# Patient Record
Sex: Female | Born: 1935 | Race: Black or African American | Hispanic: No | Smoking: Never smoker
Health system: Southern US, Community
[De-identification: ages and names within clinical notes are randomized; demographics above are authoritative.]

## PROBLEM LIST (undated history)

## (undated) DIAGNOSIS — I739 Peripheral vascular disease, unspecified: Secondary | ICD-10-CM

## (undated) DIAGNOSIS — E785 Hyperlipidemia, unspecified: Secondary | ICD-10-CM

## (undated) DIAGNOSIS — R0600 Dyspnea, unspecified: Secondary | ICD-10-CM

## (undated) DIAGNOSIS — I10 Essential (primary) hypertension: Secondary | ICD-10-CM

## (undated) DIAGNOSIS — L97209 Non-pressure chronic ulcer of unspecified calf with unspecified severity: Secondary | ICD-10-CM

## (undated) DIAGNOSIS — I249 Acute ischemic heart disease, unspecified: Secondary | ICD-10-CM

## (undated) DIAGNOSIS — E119 Type 2 diabetes mellitus without complications: Secondary | ICD-10-CM

## (undated) DIAGNOSIS — K5900 Constipation, unspecified: Secondary | ICD-10-CM

## (undated) DIAGNOSIS — K219 Gastro-esophageal reflux disease without esophagitis: Secondary | ICD-10-CM

## (undated) DIAGNOSIS — I509 Heart failure, unspecified: Secondary | ICD-10-CM

## (undated) DIAGNOSIS — M1990 Unspecified osteoarthritis, unspecified site: Secondary | ICD-10-CM

## (undated) DIAGNOSIS — D649 Anemia, unspecified: Secondary | ICD-10-CM

## (undated) HISTORY — PX: MULTIPLE TOOTH EXTRACTIONS: SHX2053

## (undated) HISTORY — PX: EYE SURGERY: SHX253

## (undated) HISTORY — PX: ABDOMINAL HYSTERECTOMY: SHX81

## (undated) HISTORY — DX: Hyperlipidemia, unspecified: E78.5

---

## 1998-05-11 ENCOUNTER — Emergency Department (HOSPITAL_COMMUNITY): Admission: EM | Admit: 1998-05-11 | Discharge: 1998-05-11 | Payer: Self-pay | Admitting: Emergency Medicine

## 1998-05-12 ENCOUNTER — Inpatient Hospital Stay: Admission: EM | Admit: 1998-05-12 | Discharge: 1998-05-14 | Payer: Self-pay | Admitting: Emergency Medicine

## 1998-05-21 ENCOUNTER — Emergency Department (HOSPITAL_COMMUNITY): Admission: EM | Admit: 1998-05-21 | Discharge: 1998-05-21 | Payer: Self-pay | Admitting: Cardiology

## 1998-05-27 ENCOUNTER — Encounter: Admission: RE | Admit: 1998-05-27 | Discharge: 1998-05-27 | Payer: Self-pay | Admitting: Internal Medicine

## 1998-06-27 ENCOUNTER — Encounter: Admission: RE | Admit: 1998-06-27 | Discharge: 1998-06-27 | Payer: Self-pay | Admitting: Internal Medicine

## 1999-04-30 ENCOUNTER — Encounter: Admission: RE | Admit: 1999-04-30 | Discharge: 1999-04-30 | Payer: Self-pay | Admitting: Hematology and Oncology

## 2000-05-20 ENCOUNTER — Encounter: Admission: RE | Admit: 2000-05-20 | Discharge: 2000-05-20 | Payer: Self-pay | Admitting: Internal Medicine

## 2000-06-17 ENCOUNTER — Encounter: Admission: RE | Admit: 2000-06-17 | Discharge: 2000-06-17 | Payer: Self-pay | Admitting: Hematology and Oncology

## 2000-07-22 ENCOUNTER — Encounter: Admission: RE | Admit: 2000-07-22 | Discharge: 2000-07-22 | Payer: Self-pay | Admitting: Hematology and Oncology

## 2000-07-29 ENCOUNTER — Encounter: Admission: RE | Admit: 2000-07-29 | Discharge: 2000-07-29 | Payer: Self-pay | Admitting: Hematology and Oncology

## 2001-06-21 ENCOUNTER — Encounter: Admission: RE | Admit: 2001-06-21 | Discharge: 2001-06-21 | Payer: Self-pay | Admitting: Internal Medicine

## 2002-04-20 ENCOUNTER — Encounter: Admission: RE | Admit: 2002-04-20 | Discharge: 2002-04-20 | Payer: Self-pay | Admitting: Internal Medicine

## 2002-06-13 ENCOUNTER — Encounter: Admission: RE | Admit: 2002-06-13 | Discharge: 2002-06-13 | Payer: Self-pay | Admitting: Internal Medicine

## 2002-06-22 ENCOUNTER — Ambulatory Visit (HOSPITAL_COMMUNITY): Admission: RE | Admit: 2002-06-22 | Discharge: 2002-06-22 | Payer: Self-pay | Admitting: Obstetrics and Gynecology

## 2003-05-07 ENCOUNTER — Encounter: Payer: Self-pay | Admitting: Cardiology

## 2003-05-07 ENCOUNTER — Encounter: Admission: RE | Admit: 2003-05-07 | Discharge: 2003-05-07 | Payer: Self-pay | Admitting: Cardiology

## 2004-07-26 ENCOUNTER — Emergency Department (HOSPITAL_COMMUNITY): Admission: EM | Admit: 2004-07-26 | Discharge: 2004-07-27 | Payer: Self-pay | Admitting: Emergency Medicine

## 2004-07-28 ENCOUNTER — Ambulatory Visit (HOSPITAL_COMMUNITY): Admission: RE | Admit: 2004-07-28 | Discharge: 2004-07-28 | Payer: Self-pay | Admitting: Emergency Medicine

## 2004-08-13 ENCOUNTER — Ambulatory Visit (HOSPITAL_COMMUNITY): Admission: RE | Admit: 2004-08-13 | Discharge: 2004-08-13 | Payer: Self-pay | Admitting: Internal Medicine

## 2012-09-12 ENCOUNTER — Emergency Department (HOSPITAL_COMMUNITY)
Admission: EM | Admit: 2012-09-12 | Discharge: 2012-09-12 | Disposition: A | Discharge status: Home / Self Care | Payer: Medicaid Other | Source: Home / Self Care | Attending: Family Medicine | Admitting: Family Medicine

## 2012-09-12 ENCOUNTER — Encounter (HOSPITAL_COMMUNITY): Payer: Self-pay

## 2012-09-12 DIAGNOSIS — K5900 Constipation, unspecified: Secondary | ICD-10-CM

## 2012-09-12 HISTORY — DX: Type 2 diabetes mellitus without complications: E11.9

## 2012-09-12 HISTORY — DX: Essential (primary) hypertension: I10

## 2012-09-12 NOTE — ED Notes (Signed)
C/o constipation x 2 days , tried magnesium citrate

## 2012-09-12 NOTE — ED Provider Notes (Signed)
History     CSN: [id number]  Arrival date & time 09/12/12  1752   First MD Initiated Contact with Patient 09/12/12 1759      Chief Complaint  Patient presents with  . Constipation    (Consider location/radiation/quality/duration/timing/severity/associated sxs/prior treatment) Patient is a 78 y.o. female presenting with constipation. The history is provided by the patient.  Constipation  The current episode started 2 days ago. The onset was gradual. The problem has been unchanged. The patient is experiencing no pain. The stool is described as soft. Prior successful therapies include laxatives. Pertinent negatives include no fever, no abdominal pain, no diarrhea, no hemorrhoids, no nausea and no vomiting.    Past Medical History  Diagnosis Date  . Hypertension   . Diabetes mellitus without complication     History reviewed. No pertinent past surgical history.  No family history on file.  History  Substance Use Topics  . Smoking status: Never Smoker   . Smokeless tobacco: Not on file  . Alcohol Use: No    OB History    Grav Para Term Preterm Abortions TAB SAB Ect Mult Living                  Review of Systems  Constitutional: Negative.  Negative for fever.  Gastrointestinal: Positive for constipation. Negative for nausea, vomiting, abdominal pain, diarrhea, blood in stool, abdominal distention and hemorrhoids.  Genitourinary: Negative for dysuria and frequency.    Allergies  Review of patient's allergies indicates no known allergies.  Home Medications   Current Outpatient Rx  Name Route Sig Dispense Refill  . AMLODIPINE BESYLATE PO Oral Take by mouth daily.    . AMOXICILLIN ER PO Oral Take by mouth.    . ASPIRIN 81 MG PO TABS Oral Take 81 mg by mouth daily.    Marland Kitchen LIPITOR PO Oral Take by mouth daily.    Marland Kitchen CALCIUM 500 PO Oral Take by mouth.    Marland Kitchen VITAMIN D-3 PO Oral Take by mouth.    Marland Kitchen GLIMEPIRIDE PO Oral Take by mouth daily.    Marland Kitchen HYDROCODONE-ACETAMINOPHEN PO  Oral Take by mouth.    . METFORMIN HCL PO Oral Take by mouth.    . VALSARTAN PO Oral Take by mouth.      BP 152/76  Pulse 114  Temp 98.4 F (36.9 C) (Oral)  Resp 18  SpO2 99%  Physical Exam  Nursing note and vitals reviewed. Constitutional: She is oriented to person, place, and time. She appears well-developed and well-nourished. No distress.  Abdominal: Soft. Bowel sounds are normal. She exhibits no distension and no mass. There is no tenderness. There is no rebound and no guarding.  Genitourinary: Rectal exam shows no external hemorrhoid and no fissure.       Nl soft brown stool in rectum, no bleeding or hemorrhoids.  Neurological: She is alert and oriented to person, place, and time.  Skin: Skin is warm and dry.    ED Course  Procedures (including critical care time)  Labs Reviewed - No data to display No results found.   1. Simple constipation       MDM          Billy Fischer, MD 09/12/12 1949

## 2013-01-08 ENCOUNTER — Encounter (HOSPITAL_COMMUNITY): Payer: Self-pay | Admitting: Emergency Medicine

## 2013-01-08 ENCOUNTER — Emergency Department (HOSPITAL_COMMUNITY)
Admission: EM | Admit: 2013-01-08 | Discharge: 2013-01-08 | Disposition: A | Discharge status: Home / Self Care | Payer: Medicare Other | Source: Home / Self Care

## 2013-01-08 DIAGNOSIS — I10 Essential (primary) hypertension: Secondary | ICD-10-CM

## 2013-01-08 MED ORDER — CLONIDINE HCL 0.1 MG PO TABS
0.1000 mg | ORAL_TABLET | Freq: Once | ORAL | Status: AC
  Administered 2013-01-08: 0.1 mg via ORAL

## 2013-01-08 MED ORDER — CLONIDINE HCL 0.1 MG PO TABS
ORAL_TABLET | ORAL | Status: AC
  Filled 2013-01-08: qty 1

## 2013-01-08 NOTE — ED Notes (Signed)
Blood pressure high.  Reports onset as yesterday.

## 2013-01-08 NOTE — ED Provider Notes (Signed)
History     CSN: [id number]  Arrival date & time 01/08/13  1515   First MD Initiated Contact with Patient 01/08/13 1525      No chief complaint on file.   (Consider location/radiation/quality/duration/timing/severity/associated sxs/prior treatment) The history is provided by the patient.   This is an 79 year old female who went to pick up some medication at the pharmacy. While there she had her blood pressure checked and was told it was high she is not aware of what the actual numbers were. She decided to come down here to urgent care because of his high blood pressure reading. She has not had any headache numbness tingling chest pain dizziness or shortness of breath. She has some pain in the left side of her neck which is related to certain movements. She is prescribed the following blood pressure medications: Amlodipine 10 Valsartan/hctz360/25  She notes she took the amlodipine this morning but cannot recall if she took the valsartan/HCTZ. At this point she is hesitant to take one in case she already took it this morning. She is agreeable to take the clonidine. In regards to diet she states that the food she eats already has salt in it for example chicken nuggets and bread. She rarely cooks and is unable to control the amount of salt she takes. Past Medical History  Diagnosis Date  . Hypertension   . Diabetes mellitus without complication     No past surgical history on file.  No family history on file.  History  Substance Use Topics  . Smoking status: Never Smoker   . Smokeless tobacco: Not on file  . Alcohol Use: No    OB History   Grav Para Term Preterm Abortions TAB SAB Ect Mult Living                  Review of Systems  Constitutional: Negative.   HENT: Negative.   Eyes: Negative.   Respiratory: Negative for cough, chest tightness and shortness of breath.   Cardiovascular: Negative for chest pain, palpitations and leg swelling.  Gastrointestinal: Negative.    Neurological: Positive for dizziness. Negative for seizures, syncope, speech difficulty, numbness and headaches.  Psychiatric/Behavioral: Negative.     Allergies  Review of patient's allergies indicates no known allergies.  Home Medications   Current Outpatient Rx  Name  Route  Sig  Dispense  Refill  . AMLODIPINE BESYLATE PO   Oral   Take by mouth daily.         . AMOXICILLIN ER PO   Oral   Take by mouth.         Marland Kitchen aspirin 81 MG tablet   Oral   Take 81 mg by mouth daily.         . Atorvastatin Calcium (LIPITOR PO)   Oral   Take by mouth daily.         . Calcium Carbonate (CALCIUM 500 PO)   Oral   Take by mouth.         . Cholecalciferol (VITAMIN D-3 PO)   Oral   Take by mouth.         Marland Kitchen GLIMEPIRIDE PO   Oral   Take by mouth daily.         Marland Kitchen HYDROCODONE-ACETAMINOPHEN PO   Oral   Take by mouth.         . METFORMIN HCL PO   Oral   Take by mouth.         . VALSARTAN PO  Oral   Take by mouth.           BP 174/85  Pulse 101  Temp(Src) 98 F (36.7 C) (Oral)  Resp 18  SpO2 100%  Physical Exam  ED Course  Procedures (including critical care time)  Labs Reviewed - No data to display No results found.   Hypertension    MDM  I'm giving her a dose of clonidine right now. I will not give her a prescription as she is to see her doctor in 2 days. I would not want to add a medication based on 1 high blood pressure reading especially, since the patient is not sure if she took her valsartan/HCTZ this morning.  She is strongly advised to take a low-sodium diet       Debbe Odea, MD 01/08/13 [phone]

## 2013-01-08 NOTE — ED Notes (Signed)
Physician completed assessment

## 2013-06-13 ENCOUNTER — Other Ambulatory Visit: Payer: Self-pay | Admitting: Family Medicine

## 2013-06-13 ENCOUNTER — Ambulatory Visit
Admission: RE | Admit: 2013-06-13 | Discharge: 2013-06-13 | Disposition: A | Discharge status: Home / Self Care | Payer: Medicare Other | Source: Ambulatory Visit | Attending: Family Medicine | Admitting: Family Medicine

## 2013-06-13 DIAGNOSIS — M25552 Pain in left hip: Secondary | ICD-10-CM

## 2013-11-06 ENCOUNTER — Encounter: Payer: Self-pay | Admitting: Podiatry

## 2013-11-06 ENCOUNTER — Ambulatory Visit: Payer: Medicare Other | Admitting: Podiatry

## 2013-11-06 VITALS — BP 148/72 | HR 77 | Resp 16 | Ht 65.5 in | Wt 150.0 lb

## 2013-11-06 DIAGNOSIS — E1159 Type 2 diabetes mellitus with other circulatory complications: Secondary | ICD-10-CM

## 2013-11-06 DIAGNOSIS — L84 Corns and callosities: Secondary | ICD-10-CM

## 2013-11-06 DIAGNOSIS — M79609 Pain in unspecified limb: Secondary | ICD-10-CM

## 2013-11-06 DIAGNOSIS — B351 Tinea unguium: Secondary | ICD-10-CM

## 2013-11-06 NOTE — Progress Notes (Signed)
   Subjective:    Patient ID: [name], female    DOB: 1926/07/05, 79 y.o.   MRN: [id number] "That place in between my toes is sore."  HPI Comments: N  Sore  L  Ulcer 2nd Left interdigital D  Almost a month O  Suddenly  C  About the same A  Shoes, especially if they're tight  T  Peroxide, use something to pad it   Patient presents for ongoing skin and nail debridement and has been a patient in this practice since 2009.   Review of Systems     Objective:   Physical Exam  Orientated x23 79 year old white female  Hyperkeratotic tissue along the lateral border of the second left toe and the medial border of the left hallux. There is no breakdown tissue in either of these areas. All nails are incurvated, hypertrophic, discolored and tender to pressure       Assessment & Plan:   Assessment: Hyperkeratotic lesions x2 associated with friction rub of the left hallux against the second left toe. Symptomatic onychomycoses x10  Plan: The hyperkeratotic lesions x2 were debrided back and a gel toe separator was dispensed to insert between the left hallux and second left toe. All 10 toenails are debrided back without any bleeding. Reappoint at three-month intervals

## 2013-12-31 ENCOUNTER — Inpatient Hospital Stay (HOSPITAL_COMMUNITY)
Admission: EM | Admit: 2013-12-31 | Discharge: 2014-01-01 | DRG: 069 | Disposition: A | Discharge status: Home / Self Care | Payer: Medicare Other | Attending: Family Medicine | Admitting: Family Medicine

## 2013-12-31 ENCOUNTER — Emergency Department (HOSPITAL_COMMUNITY): Payer: Medicare Other

## 2013-12-31 ENCOUNTER — Inpatient Hospital Stay (HOSPITAL_COMMUNITY): Payer: Medicare Other

## 2013-12-31 ENCOUNTER — Encounter (HOSPITAL_COMMUNITY): Payer: Self-pay | Admitting: Emergency Medicine

## 2013-12-31 DIAGNOSIS — E785 Hyperlipidemia, unspecified: Secondary | ICD-10-CM | POA: Diagnosis present

## 2013-12-31 DIAGNOSIS — R29898 Other symptoms and signs involving the musculoskeletal system: Secondary | ICD-10-CM | POA: Diagnosis present

## 2013-12-31 DIAGNOSIS — N179 Acute kidney failure, unspecified: Secondary | ICD-10-CM | POA: Diagnosis present

## 2013-12-31 DIAGNOSIS — Z7902 Long term (current) use of antithrombotics/antiplatelets: Secondary | ICD-10-CM

## 2013-12-31 DIAGNOSIS — Z8673 Personal history of transient ischemic attack (TIA), and cerebral infarction without residual deficits: Secondary | ICD-10-CM | POA: Diagnosis not present

## 2013-12-31 DIAGNOSIS — R209 Unspecified disturbances of skin sensation: Secondary | ICD-10-CM | POA: Diagnosis present

## 2013-12-31 DIAGNOSIS — I658 Occlusion and stenosis of other precerebral arteries: Secondary | ICD-10-CM | POA: Diagnosis present

## 2013-12-31 DIAGNOSIS — E119 Type 2 diabetes mellitus without complications: Secondary | ICD-10-CM | POA: Diagnosis present

## 2013-12-31 DIAGNOSIS — Z79899 Other long term (current) drug therapy: Secondary | ICD-10-CM

## 2013-12-31 DIAGNOSIS — E860 Dehydration: Secondary | ICD-10-CM | POA: Diagnosis present

## 2013-12-31 DIAGNOSIS — I6529 Occlusion and stenosis of unspecified carotid artery: Secondary | ICD-10-CM | POA: Diagnosis present

## 2013-12-31 DIAGNOSIS — G459 Transient cerebral ischemic attack, unspecified: Secondary | ICD-10-CM | POA: Diagnosis present

## 2013-12-31 DIAGNOSIS — Z8249 Family history of ischemic heart disease and other diseases of the circulatory system: Secondary | ICD-10-CM | POA: Diagnosis not present

## 2013-12-31 DIAGNOSIS — G458 Other transient cerebral ischemic attacks and related syndromes: Principal | ICD-10-CM | POA: Diagnosis present

## 2013-12-31 DIAGNOSIS — K5900 Constipation, unspecified: Secondary | ICD-10-CM | POA: Diagnosis present

## 2013-12-31 DIAGNOSIS — I10 Essential (primary) hypertension: Secondary | ICD-10-CM

## 2013-12-31 DIAGNOSIS — I129 Hypertensive chronic kidney disease with stage 1 through stage 4 chronic kidney disease, or unspecified chronic kidney disease: Secondary | ICD-10-CM | POA: Diagnosis present

## 2013-12-31 DIAGNOSIS — Z833 Family history of diabetes mellitus: Secondary | ICD-10-CM

## 2013-12-31 DIAGNOSIS — N183 Chronic kidney disease, stage 3 unspecified: Secondary | ICD-10-CM | POA: Diagnosis present

## 2013-12-31 DIAGNOSIS — I639 Cerebral infarction, unspecified: Secondary | ICD-10-CM

## 2013-12-31 DIAGNOSIS — D649 Anemia, unspecified: Secondary | ICD-10-CM | POA: Diagnosis present

## 2013-12-31 DIAGNOSIS — R4789 Other speech disturbances: Secondary | ICD-10-CM | POA: Diagnosis present

## 2013-12-31 DIAGNOSIS — I6350 Cerebral infarction due to unspecified occlusion or stenosis of unspecified cerebral artery: Secondary | ICD-10-CM

## 2013-12-31 DIAGNOSIS — R4781 Slurred speech: Secondary | ICD-10-CM

## 2013-12-31 HISTORY — DX: Constipation, unspecified: K59.00

## 2013-12-31 HISTORY — DX: Hyperlipidemia, unspecified: E78.5

## 2013-12-31 HISTORY — DX: Anemia, unspecified: D64.9

## 2013-12-31 LAB — GLUCOSE, CAPILLARY
GLUCOSE-CAPILLARY: 46 mg/dL — AB (ref 70–99)
GLUCOSE-CAPILLARY: 143 mg/dL — AB (ref 70–99)
Glucose-Capillary: 124 mg/dL — ABNORMAL HIGH (ref 70–99)
Glucose-Capillary: 179 mg/dL — ABNORMAL HIGH (ref 70–99)

## 2013-12-31 LAB — CBC WITH DIFFERENTIAL/PLATELET
BASOS ABS: 0 10*3/uL (ref 0.0–0.1)
Basophils Relative: 0 % (ref 0–1)
EOS ABS: 0.2 10*3/uL (ref 0.0–0.7)
Eosinophils Relative: 3 % (ref 0–5)
HCT: 34 % — ABNORMAL LOW (ref 36.0–46.0)
HEMOGLOBIN: 11.4 g/dL — AB (ref 12.0–15.0)
Lymphocytes Relative: 30 % (ref 12–46)
Lymphs Abs: 2.3 10*3/uL (ref 0.7–4.0)
MCH: 28 pg (ref 26.0–34.0)
MCHC: 33.5 g/dL (ref 30.0–36.0)
MCV: 83.5 fL (ref 78.0–100.0)
MONOS PCT: 9 % (ref 3–12)
Monocytes Absolute: 0.7 10*3/uL (ref 0.1–1.0)
NEUTROS PCT: 58 % (ref 43–77)
Neutro Abs: 4.4 10*3/uL (ref 1.7–7.7)
Platelets: 299 10*3/uL (ref 150–400)
RBC: 4.07 MIL/uL (ref 3.87–5.11)
RDW: 16.4 % — AB (ref 11.5–15.5)
WBC: 7.7 10*3/uL (ref 4.0–10.5)

## 2013-12-31 LAB — URINALYSIS, ROUTINE W REFLEX MICROSCOPIC
BILIRUBIN URINE: NEGATIVE
Glucose, UA: NEGATIVE mg/dL
HGB URINE DIPSTICK: NEGATIVE
Ketones, ur: NEGATIVE mg/dL
NITRITE: POSITIVE — AB
PROTEIN: NEGATIVE mg/dL
Specific Gravity, Urine: 1.008 (ref 1.005–1.030)
Urobilinogen, UA: 0.2 mg/dL (ref 0.0–1.0)
pH: 7.5 (ref 5.0–8.0)

## 2013-12-31 LAB — BASIC METABOLIC PANEL
BUN: 28 mg/dL — AB (ref 6–23)
CALCIUM: 9.8 mg/dL (ref 8.4–10.5)
CO2: 21 mEq/L (ref 19–32)
Chloride: 98 mEq/L (ref 96–112)
Creatinine, Ser: 1.17 mg/dL — ABNORMAL HIGH (ref 0.50–1.10)
GFR calc non Af Amer: 41 mL/min — ABNORMAL LOW (ref >90)
GFR, EST AFRICAN AMERICAN: 48 mL/min — AB (ref >90)
Glucose, Bld: 97 mg/dL (ref 70–99)
Potassium: 4.1 mEq/L (ref 3.7–5.3)
Sodium: 137 mEq/L (ref 137–147)

## 2013-12-31 LAB — URINE MICROSCOPIC-ADD ON

## 2013-12-31 LAB — HEMOGLOBIN A1C
Hgb A1c MFr Bld: 6.5 % — ABNORMAL HIGH (ref <5.7)
Mean Plasma Glucose: 140 mg/dL — ABNORMAL HIGH (ref <117)

## 2013-12-31 MED ORDER — CARVEDILOL 6.25 MG PO TABS
6.2500 mg | ORAL_TABLET | Freq: Every day | ORAL | Status: DC
  Filled 2013-12-31: qty 1

## 2013-12-31 MED ORDER — IRBESARTAN 300 MG PO TABS: 300.0000 mg | ORAL_TABLET | Freq: Every day | ORAL | Status: DC

## 2013-12-31 MED ORDER — HYDROCHLOROTHIAZIDE 25 MG PO TABS: 25.0000 mg | ORAL_TABLET | Freq: Every day | ORAL | Status: DC

## 2013-12-31 MED ORDER — POLYETHYLENE GLYCOL 3350 17 G PO PACK
17.0000 g | PACK | Freq: Every day | ORAL | Status: DC
  Administered 2014-01-01: 17 g via ORAL
  Filled 2013-12-31: qty 1

## 2013-12-31 MED ORDER — INSULIN ASPART 100 UNIT/ML ~~LOC~~ SOLN: 0.0000 [IU] | Freq: Three times a day (TID) | SUBCUTANEOUS | Status: DC

## 2013-12-31 MED ORDER — SODIUM CHLORIDE 0.9 % IV SOLN
INTRAVENOUS | Status: DC
  Administered 2013-12-31: 17:00:00 via INTRAVENOUS

## 2013-12-31 MED ORDER — SENNOSIDES-DOCUSATE SODIUM 8.6-50 MG PO TABS: 1.0000 | ORAL_TABLET | Freq: Every evening | ORAL | Status: DC | PRN

## 2013-12-31 MED ORDER — AMLODIPINE BESYLATE 10 MG PO TABS
10.0000 mg | ORAL_TABLET | Freq: Every day | ORAL | Status: DC
  Administered 2014-01-01: 10 mg via ORAL
  Filled 2013-12-31: qty 1

## 2013-12-31 MED ORDER — VALSARTAN-HYDROCHLOROTHIAZIDE 320-25 MG PO TABS: 1.0000 | ORAL_TABLET | Freq: Every day | ORAL | Status: DC

## 2013-12-31 MED ORDER — INFLUENZA VAC SPLIT QUAD 0.5 ML IM SUSP
0.5000 mL | INTRAMUSCULAR | Status: AC
  Administered 2014-01-01: 0.5 mL via INTRAMUSCULAR
  Filled 2013-12-31: qty 0.5

## 2013-12-31 MED ORDER — CLOPIDOGREL BISULFATE 75 MG PO TABS
75.0000 mg | ORAL_TABLET | Freq: Every day | ORAL | Status: DC
  Administered 2014-01-01: 75 mg via ORAL
  Filled 2013-12-31 (×2): qty 1

## 2013-12-31 MED ORDER — HEPARIN SODIUM (PORCINE) 5000 UNIT/ML IJ SOLN
5000.0000 [IU] | Freq: Three times a day (TID) | INTRAMUSCULAR | Status: DC
  Administered 2013-12-31 – 2014-01-01 (×3): 5000 [IU] via SUBCUTANEOUS
  Filled 2013-12-31 (×5): qty 1

## 2013-12-31 MED ORDER — ATORVASTATIN CALCIUM 10 MG PO TABS
10.0000 mg | ORAL_TABLET | Freq: Every day | ORAL | Status: DC
  Administered 2014-01-01: 10 mg via ORAL
  Filled 2013-12-31: qty 1

## 2013-12-31 MED ORDER — PNEUMOCOCCAL VAC POLYVALENT 25 MCG/0.5ML IJ INJ
0.5000 mL | INJECTION | INTRAMUSCULAR | Status: AC
  Administered 2014-01-01: 0.5 mL via INTRAMUSCULAR
  Filled 2013-12-31: qty 0.5

## 2013-12-31 NOTE — ED Notes (Signed)
Attempted IV x 2 without success-- IV team notified

## 2013-12-31 NOTE — Progress Notes (Addendum)
NEURO HOSPITALIST CONSULT NOTE    Reason for Consult: transient right hand numbness-weakness, dysarthria.  HPI:                                                                                                                                          [name] is an 80 y.o. female with a past medical history significant for HTN, DM, hyperlipidemia, brought to [facility] ED by family due to acute onset of the above stated symptoms. Stated that she never had similar symptoms before. She said that she woke up around 6 am today feeling fine, got ready to go to church , and then approximately at 830 she started feeling a numb sensation in the right arm up top the middle of the forearm and she couldn't use the right hand properly. She tells me that she had some confusion remembering her daughter's phone when she reached her she was noted to have slurred speech.  No reported weakness of the right LE or right face, HA, vertigo, double vision, imbalance, visual disturbances, chest pain or palpitations. [name] expressed that the whole episode lasted approximately 30 minutes. Upon arrival to the ED she had NIHSS 0 and unenhanced CT brain was unremarkable for acute abnormality. Feels back to her baseline. Takes 81 mg aspirin daily. Also on Lipitor.     Past Medical History  Diagnosis Date  . Hypertension   . Diabetes mellitus without complication     Past Surgical History  Procedure Laterality Date  . Abdominal hysterectomy    . Multiple tooth extractions      History reviewed. No pertinent family history.   Social History:  reports that she has never smoked. She has never used smokeless tobacco. She reports that she does not drink alcohol or use illicit drugs.  No Known Allergies  MEDICATIONS:                                                                                                                     I have reviewed the patient's current  medications.   ROS:  History obtained from the patient, daughter, and chart review.  General ROS: negative for - chills, fatigue, fever, night sweats, weight gain or weight loss Psychological ROS: negative for - behavioral disorder, hallucinations, memory difficulties, mood swings or suicidal ideation Ophthalmic ROS: negative for - blurry vision, double vision, eye pain or loss of vision ENT ROS: negative for - epistaxis, nasal discharge, oral lesions, sore throat, tinnitus or vertigo Allergy and Immunology ROS: negative for - hives or itchy/watery eyes Hematological and Lymphatic ROS: negative for - bleeding problems, bruising or swollen lymph nodes Endocrine ROS: negative for - galactorrhea, hair pattern changes, polydipsia/polyuria or temperature intolerance Respiratory ROS: negative for - cough, hemoptysis, shortness of breath or wheezing Cardiovascular ROS: negative for - chest pain, dyspnea on exertion, edema or irregular heartbeat Gastrointestinal ROS: negative for - abdominal pain, diarrhea, hematemesis, nausea/vomiting or stool incontinence Genito-Urinary ROS: negative for - dysuria, hematuria, incontinence or urinary frequency/urgency Musculoskeletal ROS: negative for - joint swelling or muscular weakness Neurological ROS: as noted in HPI Dermatological ROS: negative for rash and skin lesion changes   Physical exam: pleasant female in no apparent distress. Blood pressure 159/61, pulse 72, temperature 98 F (36.7 C), temperature source Oral, resp. rate 18, height 5\' 5"  (1.651 m), weight 66.497 kg (146 lb 9.6 oz), SpO2 100.00%. Head: normocephalic.  Neck: supple, no bruits, no JVD. Cardiac: no murmurs. Lungs: clear. Abdomen: soft, no tender, no mass. Extremities: no edema.  Neurologic Examination:                                                                                                       Mental Status: Alert, oriented, thought content appropriate.  Speech fluent without evidence of aphasia.  Able to follow 3 step commands without difficulty. Cranial Nerves: II: Discs flat bilaterally; Visual fields grossly normal, pupils equal, round, reactive to light and accommodation III,IV, VI: ptosis not present, extra-ocular motions intact bilaterally V,VII: smile symmetric, facial light touch sensation normal bilaterally VIII: hearing normal bilaterally IX,X: gag reflex present XI: bilateral shoulder shrug XII: midline tongue extension without atrophy or fasciculations  Motor: Right : Upper extremity   5/5    Left:     Upper extremity   5/5  Lower extremity   5/5     Lower extremity   5/5 Tone and bulk:normal tone throughout; no atrophy noted Sensory: Pinprick and light touch intact throughout, bilaterally Deep Tendon Reflexes:  Right: Upper Extremity   Left: Upper extremity   biceps (C-5 to C-6) 2/4   biceps (C-5 to C-6) 2/4 tricep (C7) 2/4    triceps (C7) 2/4 Brachioradialis (C6) 2/4  Brachioradialis (C6) 2/4  Lower Extremity Lower Extremity  quadriceps (L-2 to L-4) 2/4   quadriceps (L-2 to L-4) 2/4 Achilles (S1) 2/4   Achilles (S1) 2/4  Plantars: Right: downgoing   Left: downgoing Cerebellar: normal finger-to-nose,  normal heel-to-shin test Gait: No tested CV: pulses palpable throughout    No results found for this basename: cbc, bmp, coags, chol, tri, ldl, hga1c    Results for orders placed during the hospital encounter  of 12/31/13 (from the past 48 hour(s))  GLUCOSE, CAPILLARY     Status: Abnormal   Collection Time    12/31/13 10:01 AM      Result Value Range   Glucose-Capillary 124 (*) 70 - 99 mg/dL  CBC WITH DIFFERENTIAL     Status: Abnormal   Collection Time    12/31/13 11:50 AM      Result Value Range   WBC 7.7  4.0 - 10.5 K/uL   RBC 4.07  3.87 - 5.11 MIL/uL   Hemoglobin 11.4 (*) 12.0 - 15.0 g/dL    HCT 34.0 (*) 36.0 - 46.0 %   MCV 83.5  78.0 - 100.0 fL   MCH 28.0  26.0 - 34.0 pg   MCHC 33.5  30.0 - 36.0 g/dL   RDW 16.4 (*) 11.5 - 15.5 %   Platelets 299  150 - 400 K/uL   Neutrophils Relative % 58  43 - 77 %   Neutro Abs 4.4  1.7 - 7.7 K/uL   Lymphocytes Relative 30  12 - 46 %   Lymphs Abs 2.3  0.7 - 4.0 K/uL   Monocytes Relative 9  3 - 12 %   Monocytes Absolute 0.7  0.1 - 1.0 K/uL   Eosinophils Relative 3  0 - 5 %   Eosinophils Absolute 0.2  0.0 - 0.7 K/uL   Basophils Relative 0  0 - 1 %   Basophils Absolute 0.0  0.0 - 0.1 K/uL    Ct Head (brain) Wo Contrast  12/31/2013   CLINICAL DATA:  Right upper extremity weakness and slurred speech while getting ready for church earlier today.  EXAM: CT HEAD WITHOUT CONTRAST  TECHNIQUE: Contiguous axial images were obtained from the base of the skull through the vertex without intravenous contrast.  COMPARISON:  CT HEAD W/O CM dated 07/28/2004; CT HEAD W/O CM dated 07/26/2004  FINDINGS: Moderate age related cortical and deep atrophy and severe changes of small vessel disease of the white matter diffusely, progressive since 2005. Low-attenuation in the right side of the midbrain, not present on the prior examination. No mass lesion. No midline shift. No acute hemorrhage or hematoma. No extra-axial fluid collections. No evidence of acute infarction.  No focal osseous abnormality involving the skull. Visualized paranasal sinuses, bilateral mastoid air cells, and bilateral middle ear cavities well-aerated. Extensive bilateral carotid siphon and vertebral artery atherosclerosis.  IMPRESSION: 1. Age indeterminate nonhemorrhagic stroke involving the right side of the midbrain, new since 2005. MRI of the brain with diffusion imaging may be helpful in determining acuity of this finding. 2. Progressive moderate generalized atrophy and severe chronic microvascular ischemic changes of the white matter since 2005.   Electronically Signed   By: Evangeline Dakin M.D.    On: 12/31/2013 10:24   Assessment/Plan: 80 y/o with HTN, DM, and hyperlipidemia and a constellation of symptoms concerning for a TIA involving left brain. ABCD2 score 5. Symptoms had resolved and thus no a candidate for thrombolysis. Recommend: 1) TIA work up. 2) Plavix 75 mg daily, if no contraindications. 3) Will follow up  Dorian Pod, MD 12/31/2013, 1:03 PM

## 2013-12-31 NOTE — ED Notes (Signed)
Pt reports she was getting ready for church this morning and suddenly her R hand felt numb and weak and she felt she could not get her words out. She states the symptoms have resolved now. She is a&ox4, denies pain

## 2013-12-31 NOTE — ED Provider Notes (Signed)
CSN: [id number]     Arrival date & time 12/31/13  [id number] History   First MD Initiated Contact with Patient 12/31/13 1012     Chief Complaint  Patient presents with  . Stroke Symptoms   (Consider location/radiation/quality/duration/timing/severity/associated sxs/prior Treatment) HPI Comments: 80 yo female with lipids, htn, DM hx presents after stroke sxs early this am around 9 am.  Pt spoke with daughter after she noticed right arm and hand numbness and slurred speech, daughter agreed speech slurred, lasted 5 to 10 min and resolved. No hx of heart or stroke issues.  No sxs currently.  No ha.  Pt had asa this am.  Sxs improved on their own.  The history is provided by the patient.    Past Medical History  Diagnosis Date  . Hypertension   . Diabetes mellitus without complication    Past Surgical History  Procedure Laterality Date  . Abdominal hysterectomy    . Multiple tooth extractions     History reviewed. No pertinent family history. History  Substance Use Topics  . Smoking status: Never Smoker   . Smokeless tobacco: Never Used  . Alcohol Use: No   OB History   Grav Para Term Preterm Abortions TAB SAB Ect Mult Living                 Review of Systems  Constitutional: Negative for fever and chills.  HENT: Negative for congestion.   Eyes: Negative for visual disturbance.  Respiratory: Negative for shortness of breath.   Cardiovascular: Negative for chest pain.  Gastrointestinal: Negative for vomiting and abdominal pain.  Genitourinary: Negative for dysuria and flank pain.  Musculoskeletal: Negative for back pain, neck pain and neck stiffness.  Skin: Negative for rash.  Neurological: Positive for speech difficulty and numbness. Negative for weakness, light-headedness and headaches.    Allergies  Review of patient's allergies indicates no known allergies.  Home Medications   Current Outpatient Rx  Name  Route  Sig  Dispense  Refill  . amLODipine (NORVASC) 10 MG  tablet   Oral   Take 10 mg by mouth daily.         Marland Kitchen aspirin 81 MG tablet   Oral   Take 81 mg by mouth daily.         Marland Kitchen atorvastatin (LIPITOR) 10 MG tablet   Oral   Take 10 mg by mouth daily.         . Calcium Carbonate (CALCIUM 500 PO)   Oral   Take by mouth.         . carvedilol (COREG) 6.25 MG tablet   Oral   Take 6.25 mg by mouth at bedtime.         . Cholecalciferol (VITAMIN D-3 PO)   Oral   Take 1,000 Units by mouth daily.          Marland Kitchen glimepiride (AMARYL) 4 MG tablet   Oral   Take 4 mg by mouth daily with breakfast.         . metFORMIN (GLUCOPHAGE) 1000 MG tablet   Oral   Take 1,000 mg by mouth 2 (two) times daily with a meal.         . polyethylene glycol (MIRALAX / GLYCOLAX) packet   Oral   Take 17 g by mouth daily.         . valsartan-hydrochlorothiazide (DIOVAN-HCT) 320-25 MG per tablet   Oral   Take 1 tablet by mouth daily.  BP 160/64  Pulse 83  Temp(Src) 98 F (36.7 C) (Oral)  Resp 16  Ht 5\' 5"  (1.651 m)  Wt 146 lb 9.6 oz (66.497 kg)  BMI 24.40 kg/m2  SpO2 100% Physical Exam  Nursing note and vitals reviewed. Constitutional: She is oriented to person, place, and time. She appears well-developed and well-nourished.  HENT:  Head: Normocephalic and atraumatic.  Eyes: Conjunctivae are normal. Right eye exhibits no discharge. Left eye exhibits no discharge.  Neck: Normal range of motion. Neck supple. No tracheal deviation present.  Cardiovascular: Normal rate and regular rhythm.   Pulmonary/Chest: Effort normal and breath sounds normal.  Abdominal: Soft. She exhibits no distension. There is no tenderness. There is no guarding.  Musculoskeletal: She exhibits no edema.  Neurological: She is alert and oriented to person, place, and time. No cranial nerve deficit.  5+ strength in UE and LE with f/e at major joints. Sensation to palpation intact in UE and LE. CNs 2-12 grossly intact.  EOMFI.  PERRL.   Finger nose and  coordination intact bilateral.   Visual fields intact to finger testing. NIH zero in ED Normal speech, normal language.  Skin: Skin is warm. No rash noted.  Psychiatric: She has a normal mood and affect.    ED Course  Procedures (including critical care time) Labs Review Labs Reviewed  GLUCOSE, CAPILLARY - Abnormal; Notable for the following:    Glucose-Capillary 124 (*)    All other components within normal limits  BASIC METABOLIC PANEL - Abnormal; Notable for the following:    BUN 28 (*)    Creatinine, Ser 1.17 (*)    GFR calc non Af Amer 41 (*)    GFR calc Af Amer 48 (*)    All other components within normal limits  CBC WITH DIFFERENTIAL - Abnormal; Notable for the following:    Hemoglobin 11.4 (*)    HCT 34.0 (*)    RDW 16.4 (*)    All other components within normal limits  URINALYSIS, ROUTINE W REFLEX MICROSCOPIC - Abnormal; Notable for the following:    Nitrite POSITIVE (*)    Leukocytes, UA TRACE (*)    All other components within normal limits  URINE MICROSCOPIC-ADD ON - Abnormal; Notable for the following:    Bacteria, UA MANY (*)    All other components within normal limits  HEMOGLOBIN A1C  LIPID PANEL   Imaging Review Ct Head (brain) Wo Contrast  12/31/2013   CLINICAL DATA:  Right upper extremity weakness and slurred speech while getting ready for church earlier today.  EXAM: CT HEAD WITHOUT CONTRAST  TECHNIQUE: Contiguous axial images were obtained from the base of the skull through the vertex without intravenous contrast.  COMPARISON:  CT HEAD W/O CM dated 07/28/2004; CT HEAD W/O CM dated 07/26/2004  FINDINGS: Moderate age related cortical and deep atrophy and severe changes of small vessel disease of the white matter diffusely, progressive since 2005. Low-attenuation in the right side of the midbrain, not present on the prior examination. No mass lesion. No midline shift. No acute hemorrhage or hematoma. No extra-axial fluid collections. No evidence of acute  infarction.  No focal osseous abnormality involving the skull. Visualized paranasal sinuses, bilateral mastoid air cells, and bilateral middle ear cavities well-aerated. Extensive bilateral carotid siphon and vertebral artery atherosclerosis.  IMPRESSION: 1. Age indeterminate nonhemorrhagic stroke involving the right side of the midbrain, new since 2005. MRI of the brain with diffusion imaging may be helpful in determining acuity of this finding. 2.  Progressive moderate generalized atrophy and severe chronic microvascular ischemic changes of the white matter since 2005.   Electronically Signed   By: Evangeline Dakin M.D.   On: 12/31/2013 10:24    EKG Interpretation    Date/Time:  Sunday December 31 2013 09:48:04 EST Ventricular Rate:  83 PR Interval:  140 QRS Duration: 86 QT Interval:  384 QTC Calculation: 451 R Axis:   80 Text Interpretation:  Normal sinus rhythm Normal ECG Confirmed by Shernita Rabinovich  MD, Dawne Casali ([id number]) on 12/31/2013 2:51:39 PM            MDM   1. Stroke, acute, thrombotic   2. Slurred speech    TIA sxs. Normal neuro in ED, evaluated on arrival, NIH zero on arrival.   Plan for observation in hospital for stroke work up.  Recheck, no neuro changes.  Spoke with [facility] for tele admission, paged neuro for consult. Pt had asa PTA.   The patients results and plan were reviewed and discussed.   Any x-rays performed were personally reviewed by myself.   Differential diagnosis were considered with the presenting HPI.  EKG: reviewed  Admission/ observation were discussed with the admitting physician, patient and/or family and they are comfortable with the plan.       Mariea Clonts, MD 12/31/13 [phone]

## 2013-12-31 NOTE — H&P (Addendum)
History and Physical Examination   [name] [id number] DOB: [birth year] DOA: 12/31/2013  Referring physician: Dr. Reather Converse PCP: [name], MD   Chief Complaint: Right hand numbness and speech problem  HPI: [name] is a 80 y.o. female with hypertension, diabetes mellitus and dyslipidemia was preparing for church early this morning and noticed a sudden onset of right hand numbness and weakness and difficulty with her speech.  She describes having a difficulty getting words out.  Her symptoms lasted 5-10 minutes and then completely resolved.  The patient does not have a history of cerebrovascular disease.  She had taken a daily aspirin.  She reports no visual changes.  She denies chest pain and shortness of breath.  She denies falling.  She has no history of seizure activity.  She takes her medications as prescribed daily.  She reports that she is recently recovering from an upper respiratory illness.  She denies having fever and chills.  In the emergency department she was evaluated and hospitalization was requested for further evaluation and management.  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  Neurology consult on the patient and recommended admission with further stroke workup.  Past Medical History Past Medical History  Diagnosis Date  . Hypertension   . Diabetes mellitus without complication   . Dyslipidemia   . Constipation     Past Surgical History Past Surgical History  Procedure Laterality Date  . Abdominal hysterectomy    . Multiple tooth extractions     Home Meds: Prior to Admission medications   Medication Sig Start Date End Date Taking? Authorizing Provider  amLODipine (NORVASC) 10 MG tablet Take 10 mg by mouth daily.   Yes Historical Provider, MD  aspirin 81 MG tablet Take 81 mg by mouth daily.   Yes Historical Provider, MD  atorvastatin (LIPITOR) 10 MG tablet Take 10 mg by mouth daily.   Yes  Historical Provider, MD  Calcium Carbonate (CALCIUM 500 PO) Take by mouth.   Yes Historical Provider, MD  carvedilol (COREG) 6.25 MG tablet Take 6.25 mg by mouth at bedtime.   Yes Historical Provider, MD  Cholecalciferol (VITAMIN D-3 PO) Take 1,000 Units by mouth daily.    Yes Historical Provider, MD  glimepiride (AMARYL) 4 MG tablet Take 4 mg by mouth daily with breakfast.   Yes Historical Provider, MD  metFORMIN (GLUCOPHAGE) 1000 MG tablet Take 1,000 mg by mouth 2 (two) times daily with a meal.   Yes Historical Provider, MD  polyethylene glycol (MIRALAX / GLYCOLAX) packet Take 17 g by mouth daily.   Yes Historical Provider, MD  valsartan-hydrochlorothiazide (DIOVAN-HCT) 320-25 MG per tablet Take 1 tablet by mouth daily.   Yes Historical Provider, MD   Allergies: Review of patient's allergies indicates no known allergies.  Social History:  History   Social History  . Marital Status: Legally Separated    Spouse Name: N/A    Number of Children: N/A  . Years of Education: N/A   Occupational History  . Not on file.   Social History Main Topics  . Smoking status: Never Smoker   . Smokeless tobacco: Never Used  . Alcohol Use: No  . Drug Use: No  . Sexual Activity: Not on file   Other Topics Concern  . Not on file   Social History Narrative  . No narrative on file   Family History: Hypertension, Diabetes mellitus.   Review of Systems:  Constitutional: Negative for fever  and chills.  HENT: occasional cough, chest congestion reported.  Eyes: Negative for visual disturbance.  Respiratory: Negative for shortness of breath.  Cardiovascular: Negative for chest pain. Negative for palpitations.  Gastrointestinal: Negative for nausea, vomiting and abdominal pain.  Genitourinary: Negative for dysuria and flank pain.  Musculoskeletal: Negative for back pain, neck pain and neck stiffness.  Skin: Negative for rash.  Neurological: Positive for speech difficulty and numbness. Negative for  weakness, light-headedness and headaches.  All other systems reviewed and reported as negative.   Physical Exam: Blood pressure 159/61, pulse 72, temperature 98 F (36.7 C), temperature source Oral, resp. rate 18, height 5\' 5"  (1.651 m), weight 146 lb 9.6 oz (66.497 kg), SpO2 100.00%. Constitutional: She is oriented to person, place, and time. She appears well-developed and well-nourished.  HENT: Head: Normocephalic and atraumatic.  Eyes: Conjunctivae are normal. Right eye exhibits no discharge. Left eye exhibits no discharge.  Neck: Normal range of motion. Neck supple. No tracheal deviation present.  Cardiovascular: Normal rate and regular rhythm.  Pulmonary/Chest: Effort normal and breath sounds normal.  Abdominal: Soft. She exhibits no distension. There is no tenderness. There is no guarding.  Musculoskeletal: She exhibits no edema.  Neurological: She is alert and oriented to person, place, and time. No cranial nerve deficit.  5+ strength in UE and LE with f/e at major joints. Sensation to palpation intact in UE and LE. CNs 2-12 grossly intact. PERRL. Finger nose and coordination intact bilateral.  Visual fields intact to finger testing. NIH score 0. Normal speech, normal language.  Skin: Skin is warm. No rash noted.  Psychiatric: She has a normal mood and affect.   Lab  And Imaging results:  Results for orders placed during the hospital encounter of 12/31/13 (from the past 24 hour(s))  GLUCOSE, CAPILLARY     Status: Abnormal   Collection Time    12/31/13 10:01 AM      Result Value Range   Glucose-Capillary 124 (*) 70 - 99 mg/dL  BASIC METABOLIC PANEL     Status: Abnormal   Collection Time    12/31/13 11:50 AM      Result Value Range   Sodium 137  137 - 147 mEq/L   Potassium 4.1  3.7 - 5.3 mEq/L   Chloride 98  96 - 112 mEq/L   CO2 21  19 - 32 mEq/L   Glucose, Bld 97  70 - 99 mg/dL   BUN 28 (*) 6 - 23 mg/dL   Creatinine, Ser 1.17 (*) 0.50 - 1.10 mg/dL   Calcium 9.8  8.4 -  10.5 mg/dL   GFR calc non Af Amer 41 (*) >90 mL/min   GFR calc Af Amer 48 (*) >90 mL/min  CBC WITH DIFFERENTIAL     Status: Abnormal   Collection Time    12/31/13 11:50 AM      Result Value Range   WBC 7.7  4.0 - 10.5 K/uL   RBC 4.07  3.87 - 5.11 MIL/uL   Hemoglobin 11.4 (*) 12.0 - 15.0 g/dL   HCT 34.0 (*) 36.0 - 46.0 %   MCV 83.5  78.0 - 100.0 fL   MCH 28.0  26.0 - 34.0 pg   MCHC 33.5  30.0 - 36.0 g/dL   RDW 16.4 (*) 11.5 - 15.5 %   Platelets 299  150 - 400 K/uL   Neutrophils Relative % 58  43 - 77 %   Neutro Abs 4.4  1.7 - 7.7 K/uL   Lymphocytes Relative  30  12 - 46 %   Lymphs Abs 2.3  0.7 - 4.0 K/uL   Monocytes Relative 9  3 - 12 %   Monocytes Absolute 0.7  0.1 - 1.0 K/uL   Eosinophils Relative 3  0 - 5 %   Eosinophils Absolute 0.2  0.0 - 0.7 K/uL   Basophils Relative 0  0 - 1 %   Basophils Absolute 0.0  0.0 - 0.1 K/uL   Impression / Plan  Nonhemorrhagic stroke involving the right side of the midbrain - admit to telemetry monitored bed.  Initiate stroke workup including MRI brain without contrast, MRA, echocardiogram, carotid Dopplers, fasting lipid panel, hemoglobin A1c, PT/OT evaluation.  Appreciate neurology consultation.  Plavix has been recommended 75 mg daily for antiplatelet therapy. Neuro checks ordered.  Swallow evaluation.     Hypertension - will resume home medications after 24 hours   Diabetes mellitus without complication - check hemoglobin A1c, monitor blood glucose ACHS, supplemental insulin for elevated blood glucose readings.  Dyslipidemia - check fasting lipid panel, continue lipitor at current dose for now.      Constipation - laxatives as needed  Family Communication: at bedside   Loyal Ladue, [place] 12/31/2013, 1:23 PM

## 2013-12-31 NOTE — ED Notes (Signed)
Pt states woke up at approx 6:30 this morning and felt fine-- at about 8:30 right hand started feeling numb, speech was slurred-- on arrival to ed- speech at baseline, equal grips, ambulates without difficulty.

## 2014-01-01 DIAGNOSIS — I6330 Cerebral infarction due to thrombosis of unspecified cerebral artery: Secondary | ICD-10-CM

## 2014-01-01 DIAGNOSIS — N179 Acute kidney failure, unspecified: Secondary | ICD-10-CM

## 2014-01-01 DIAGNOSIS — I059 Rheumatic mitral valve disease, unspecified: Secondary | ICD-10-CM

## 2014-01-01 DIAGNOSIS — G459 Transient cerebral ischemic attack, unspecified: Secondary | ICD-10-CM

## 2014-01-01 LAB — GLUCOSE, CAPILLARY
Glucose-Capillary: 68 mg/dL — ABNORMAL LOW (ref 70–99)
Glucose-Capillary: 64 mg/dL — ABNORMAL LOW (ref 70–99)
Glucose-Capillary: 120 mg/dL — ABNORMAL HIGH (ref 70–99)
Glucose-Capillary: 102 mg/dL — ABNORMAL HIGH (ref 70–99)

## 2014-01-01 LAB — CBC
HCT: 35.8 % — ABNORMAL LOW (ref 36.0–46.0)
HEMOGLOBIN: 12 g/dL (ref 12.0–15.0)
MCH: 27.7 pg (ref 26.0–34.0)
MCHC: 33.5 g/dL (ref 30.0–36.0)
MCV: 82.7 fL (ref 78.0–100.0)
Platelets: 317 10*3/uL (ref 150–400)
RBC: 4.33 MIL/uL (ref 3.87–5.11)
RDW: 16.3 % — AB (ref 11.5–15.5)
WBC: 7.1 10*3/uL (ref 4.0–10.5)

## 2014-01-01 LAB — COMPREHENSIVE METABOLIC PANEL
ALT: 25 U/L (ref 0–35)
AST: 41 U/L — ABNORMAL HIGH (ref 0–37)
Albumin: 3.5 g/dL (ref 3.5–5.2)
Alkaline Phosphatase: 46 U/L (ref 39–117)
BUN: 19 mg/dL (ref 6–23)
CALCIUM: 9.3 mg/dL (ref 8.4–10.5)
CO2: 24 mEq/L (ref 19–32)
Chloride: 106 mEq/L (ref 96–112)
Creatinine, Ser: 1.05 mg/dL (ref 0.50–1.10)
GFR calc non Af Amer: 47 mL/min — ABNORMAL LOW (ref >90)
GFR, EST AFRICAN AMERICAN: 55 mL/min — AB (ref >90)
Glucose, Bld: 66 mg/dL — ABNORMAL LOW (ref 70–99)
Potassium: 4.1 mEq/L (ref 3.7–5.3)
SODIUM: 146 meq/L (ref 137–147)
TOTAL PROTEIN: 8.8 g/dL — AB (ref 6.0–8.3)
Total Bilirubin: 0.4 mg/dL (ref 0.3–1.2)

## 2014-01-01 LAB — LIPID PANEL
CHOL/HDL RATIO: 2.4 ratio
CHOLESTEROL: 116 mg/dL (ref 0–200)
HDL: 48 mg/dL (ref >39)
LDL Cholesterol: 45 mg/dL (ref 0–99)
Triglycerides: 114 mg/dL (ref <150)
VLDL: 23 mg/dL (ref 0–40)

## 2014-01-01 LAB — HEMOGLOBIN A1C
HEMOGLOBIN A1C: 6.6 % — AB (ref <5.7)
Mean Plasma Glucose: 143 mg/dL — ABNORMAL HIGH (ref <117)

## 2014-01-01 MED ORDER — CLOPIDOGREL BISULFATE 75 MG PO TABS: 75.0000 mg | ORAL_TABLET | Freq: Every day | ORAL | Status: DC

## 2014-01-01 NOTE — Progress Notes (Signed)
VASCULAR LAB PRELIMINARY  PRELIMINARY  PRELIMINARY  PRELIMINARY  Carotid Dopplers completed.    Preliminary report:  1-39% ICA stenosis.  Vertebral artery flow is antegrade.  [name], RVT 01/01/2014, 9:43 AM

## 2014-01-01 NOTE — Progress Notes (Signed)
D/C orders received. Pt and family educated on d/c instructions and given handout. Pt and family verbalized understanding. Pt taken downstairs by staff via wheelchair.

## 2014-01-01 NOTE — Evaluation (Signed)
Physical Therapy Evaluation Patient Details Name: [name] MRN: [id number] DOB: August 27, 1926 Today's Date: 01/01/2014 Time: [id number] PT Time Calculation (min): 21 min  PT Assessment / Plan / Recommendation History of Present Illness  [name] is a 80 y.o. female with hypertension, diabetes mellitus and dyslipidemia was preparing for church early this morning and noticed a sudden onset of right hand numbness and weakness and difficulty with her speech.  She describes having a difficulty getting words out.  MRI negative for acute CVA  Clinical Impression  Pt evaluated by PT; scored 20 on DGI and was independent with all mobility. Pt presents to be at baseline for mobility at this time. Educated family and pt on signs and symptoms of stroke for safety. Pt very appreciative. No acute PT needs warranted at this time. Will sign off. Thank you for this referral. Pt encouraged to ambulate unit as tolerated with RN or family.     PT Assessment  Patent does not need any further PT services    Follow Up Recommendations  No PT follow up    Does the patient have the potential to tolerate intense rehabilitation      Barriers to Discharge        Equipment Recommendations  None recommended by PT    Recommendations for Other Services     Frequency      Precautions / Restrictions Precautions Precautions: None Restrictions Weight Bearing Restrictions: No   Pertinent Vitals/Pain No complaints       Mobility  Bed Mobility Overal bed mobility: Independent Transfers Overall transfer level: Independent Equipment used: None General transfer comment: pt demo good technique and stability  Ambulation/Gait Ambulation/Gait assistance: Independent Ambulation Distance (Feet): 300 Feet Assistive device: None Gait Pattern/deviations: WFL(Within Functional Limits) Gait velocity: slightly decreased; but at baseline per pt Gait velocity interpretation: Below normal speed for  age/gender General Gait Details: pt able to ambulate without any AD or (A) today; performed DGI (see score below). no LOB noted Stairs: Yes Stairs assistance: Modified independent (Device/Increase time) Stair Management: One rail Right;Alternating pattern;Forwards Number of Stairs: 2 General stair comments: demo good safety awareness  Modified Rankin (Stroke Patients Only) Pre-Morbid Rankin Score: No symptoms Modified Rankin: No symptoms         PT Diagnosis:    PT Problem List:   PT Treatment Interventions:       PT Goals(Current goals can be found in the care plan section) Acute Rehab PT Goals Patient Stated Goal: to go home tomorrow PT Goal Formulation: No goals set, d/c therapy  Visit Information  Last PT Received On: 01/01/14 Assistance Needed: +1 History of Present Illness: [name] is a 80 y.o. female with hypertension, diabetes mellitus and dyslipidemia was preparing for church early this morning and noticed a sudden onset of right hand numbness and weakness and difficulty with her speech.  She describes having a difficulty getting words out.  MRI negative for acute CVA       Prior Functioning  Home Living Family/patient expects to be discharged to:: Private residence Living Arrangements: Children Available Help at Discharge: Available PRN/intermittently Type of Home: House Home Access: Stairs to enter Technical brewer of Steps: 3 Entrance Stairs-Rails: Right Home Layout: One level Home Equipment: Shower seat Additional Comments: pt takes care of her son, who has had stroke Prior Function Level of Independence: Independent Comments: Pt independent with driving and IADLs Communication Communication: No difficulties Dominant Hand: Right    Cognition  Cognition Arousal/Alertness: Awake/alert  Behavior During Therapy: WFL for tasks assessed/performed Overall Cognitive Status: Within Functional Limits for tasks assessed    Extremity/Trunk  Assessment Upper Extremity Assessment Upper Extremity Assessment: Defer to OT evaluation Lower Extremity Assessment Lower Extremity Assessment: Overall WFL for tasks assessed Cervical / Trunk Assessment Cervical / Trunk Assessment: Normal   Balance Balance Overall balance assessment: Independent Standardized Balance Assessment Standardized Balance Assessment : Dynamic Gait Index Dynamic Gait Index Level Surface: Normal Change in Gait Speed: Normal Gait with Horizontal Head Turns: Mild Impairment Gait with Vertical Head Turns: Mild Impairment Gait and Pivot Turn: Normal Step Over Obstacle: Mild Impairment Step Around Obstacles: Normal Steps: Mild Impairment Total Score: 20  End of Session PT - End of Session Equipment Utilized During Treatment: Gait belt Activity Tolerance: Patient tolerated treatment well Patient left: in bed;with call bell/phone within reach;with family/visitor present Nurse Communication: Mobility status  GP     Gustavus Bryant, [place] 01/01/2014, 3:31 PM

## 2014-01-01 NOTE — Evaluation (Signed)
Occupational Therapy Evaluation Patient Details Name: [name] MRN: [id number] DOB: 12-09-1925 Today's Date: 01/01/2014 Time: [id number] OT Time Calculation (min): 13 min  OT Assessment / Plan / Recommendation History of present illness [name] is a 80 y.o. female with hypertension, diabetes mellitus and dyslipidemia was preparing for church early this morning and noticed a sudden onset of right hand numbness and weakness and difficulty with her speech.  She describes having a difficulty getting words out.  MRI negative for acute CVA   Clinical Impression   Patient evaluated by Occupational Therapy with no further acute OT needs identified. All education has been completed and the patient has no further questions. Pt at baseline.  See below for any follow-up Occupational Therapy or equipment needs. OT is signing off. Thank you for this referral.     OT Assessment  Patient does not need any further OT services    Follow Up Recommendations  No OT follow up    Barriers to Discharge      Equipment Recommendations  None recommended by OT    Recommendations for Other Services    Frequency       Precautions / Restrictions Precautions Precautions: None   Pertinent Vitals/Pain     ADL  Eating/Feeding: Independent Where Assessed - Eating/Feeding: Edge of bed Grooming: Wash/dry hands;Wash/dry face;Teeth care;Brushing hair;Modified independent Where Assessed - Grooming: Unsupported standing Upper Body Bathing: Modified independent Where Assessed - Upper Body Bathing: Unsupported standing Lower Body Bathing: Modified independent Where Assessed - Lower Body Bathing: Unsupported standing Upper Body Dressing: Set up Where Assessed - Upper Body Dressing: Unsupported sitting Lower Body Dressing: Set up Where Assessed - Lower Body Dressing: Unsupported sit to stand Toilet Transfer: Modified independent Toilet Transfer Method: Sit to Loss adjuster, chartered:  Regular height toilet Toileting - Clothing Manipulation and Hygiene: Modified independent Where Assessed - Best boy and Hygiene: Standing Tub/Shower Transfer: Modified independent Tub/Shower Transfer Method: Ambulating Transfers/Ambulation Related to ADLs: independent ADL Comments: Pt at baseline    OT Diagnosis:    OT Problem List:   OT Treatment Interventions:     OT Goals(Current goals can be found in the care plan section)    Visit Information  Last OT Received On: 01/01/14 Assistance Needed: +1 History of Present Illness: [name] is a 80 y.o. female with hypertension, diabetes mellitus and dyslipidemia was preparing for church early this morning and noticed a sudden onset of right hand numbness and weakness and difficulty with her speech.  She describes having a difficulty getting words out.  MRI negative for acute CVA       Prior Functioning     Home Living Family/patient expects to be discharged to:: Private residence Living Arrangements: Children Available Help at Discharge: Available PRN/intermittently Type of Home: House Home Access: Stairs to enter Technical brewer of Steps: 3 Home Layout: One level Home Equipment: Shower seat Prior Function Level of Independence: Independent Comments: Pt independent with driving and IADLs Communication Communication: No difficulties Dominant Hand: Right         Vision/Perception Vision - History Baseline Vision: Wears glasses all the time Patient Visual Report: No change from baseline Vision - Assessment Vision Assessment: Vision not tested Additional Comments: Pt able to read text without difficulties Perception Perception: Within Functional Limits Praxis Praxis: Intact   Cognition  Cognition Arousal/Alertness: Awake/alert Behavior During Therapy: WFL for tasks assessed/performed Overall Cognitive Status: Within Functional Limits for tasks assessed    Extremity/Trunk  Assessment Upper  Extremity Assessment Upper Extremity Assessment: Overall WFL for tasks assessed Lower Extremity Assessment Lower Extremity Assessment: Defer to PT evaluation Cervical / Trunk Assessment Cervical / Trunk Assessment: Normal     Mobility Bed Mobility Overal bed mobility: Independent Transfers Overall transfer level: Independent     Exercise     Balance Balance Overall balance assessment: Independent   End of Session OT - End of Session Activity Tolerance: Patient tolerated treatment well Patient left: in bed;with family/visitor present  Brooks, Ellard Artis M 01/01/2014, 3:23 PM

## 2014-01-01 NOTE — Discharge Summary (Signed)
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 Will discharge home with family care. No needs or deficit at discharge.  Diet recommendation: low sodium and low carb diet  Filed Weights   12/31/13 0941 12/31/13 1635  Weight: 66.497 kg (146 lb 9.6 oz) 66.5 kg (146 lb 9.7 oz)    History of present illness:  80 y.o. female with hypertension, diabetes mellitus and dyslipidemia was preparing for church early this morning and noticed a sudden onset of right hand numbness and weakness and difficulty with her speech. She describes having a difficulty getting words out. Her symptoms lasted 5-10 minutes and then completely resolved. The patient does not have a history of cerebrovascular disease. She had taken a daily aspirin. She reports no visual changes. She denies chest pain and shortness of breath. She denies falling. She has no history of seizure activity. She takes her medications as prescribed daily. She reports that she is recently recovering from an upper respiratory illness. She denies having fever and chills. In the emergency department she was evaluated and hospitalization was requested for further evaluation and management. 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 Neurology consult on the patient and recommended admission with further stroke workup.   Hospital Course:  1-TIA/stroke like symptoms: patient  symptoms completely resolved. MRI/MRA r/o acute infarct. -patient with old nonhemorrhagic right side stroke -2-D echo w/o abnormalities to explain embolic stroke -mild ICA on carotid dopplers -will continue risk factors modifications and will switch patient from ASA to plavix for better secondary prevention -no needs identified by PT/OT/SPL -will discharge home with family care -LDL 45 and A1C 6.5  2-HTN: stable. Will continue current home regimen and will recommend low sodium diet  3-HLD: continue statins  4-DM: A1C well controlled. Continue metformin and amaryl -patient advise to follow low carbs diet  5-ICA stenosis bilaterally: 1-39% plaque obstruction. Continue statins and patient will be now on plavix  6-Chronic constipation: continue miralax PRN. -advise to maintain herself well hydrated and to increase fiber intake  7-Acute on chornic renal failure (stage 2-3 at baseline): most likely due to mild dehydration and continue use of nephrotoxic agents. -resolved and back to baseline with IVF's -home meds resume at discharge -BMET to be follow in 2 weeks with PCP  Procedures:  See below for x-ray reports   2-D echo: - Left ventricle: The cavity size was normal. Wall thickness was normal. Systolic function was normal. The estimated ejection fraction was in the range of 55% to 60%. - Mitral valve: Calcified annulus. Moderately thickened leaflets . Mild regurgitation.   Carotid Dopplers: Bilateral: intimal wall thickening CCA. Mild mixed plaque CCA and origin and proximal ICA. 1-39% ICA stenosis. Vertebral artery flow is antegrade.  Consultations:  Neurology   Discharge Exam: Filed Vitals:   01/01/14 1410  BP: 147/73  Pulse: 68  Temp: 97.7 F (36.5 C)  Resp: 20    General: NAD, no focal neurologic deficit; cooperative to examination and able to follow commands properly Cardiovascular: S1 and S2, no rubs or  gallops; regular rate Respiratory: CTA  bilaterally Abdomen: soft, NT, ND, positive BS Extremities: no edema, no cyanosis or clubbing Neuro: CN intact, AAOX3, no focal motor deficit, MS 5/5 bilaterally, normal finger to nose. No aphasia   Discharge Instructions  Discharge Orders   Future Appointments Provider Department Dept Phone   01/29/2014 9:15 AM [name], [name]   Future Orders Complete By Expires   Diet - low sodium heart healthy  As directed    Discharge instructions  As directed    Comments:     Take medications as prescribed Follow a low sodium/heart healthy diet Keep yourself well hydrated Stop Aspirin; you have been started on Plavix instead for secondary prevention       Medication List    STOP taking these medications       aspirin 81 MG tablet      TAKE these medications       amLODipine 10 MG tablet  Commonly known as:  NORVASC  Take 10 mg by mouth daily.     atorvastatin 10 MG tablet  Commonly known as:  LIPITOR  Take 10 mg by mouth daily.     CALCIUM 500 PO  Take by mouth.     carvedilol 6.25 MG tablet  Commonly known as:  COREG  Take 6.25 mg by mouth at bedtime.     clopidogrel 75 MG tablet  Commonly known as:  PLAVIX  Take 1 tablet (75 mg total) by mouth daily with breakfast.     glimepiride 4 MG tablet  Commonly known as:  AMARYL  Take 4 mg by mouth daily with breakfast.     metFORMIN 1000 MG tablet  Commonly known as:  GLUCOPHAGE  Take 1,000 mg by mouth 2 (two) times daily with a meal.     polyethylene glycol packet  Commonly known as:  MIRALAX / GLYCOLAX  Take 17 g by mouth daily.     valsartan-hydrochlorothiazide 320-25 MG per tablet  Commonly known as:  DIOVAN-HCT  Take 1 tablet by mouth daily.     VITAMIN D-3 PO  Take 1,000 Units by mouth daily.       No Known Allergies     Follow-up Information   Follow up with [name], MD. Schedule an appointment as soon as possible for a visit in 2 weeks.   Specialty:   Family Medicine   Contact information:   [address] [place]  [address] [phone]       The results of significant diagnostics from this hospitalization (including imaging, microbiology, ancillary and laboratory) are listed below for reference.    Significant Diagnostic Studies: Ct Head (brain) Wo Contrast  12/31/2013   CLINICAL DATA:  Right upper extremity weakness and slurred speech while getting ready for church earlier today.  EXAM: CT HEAD WITHOUT CONTRAST  TECHNIQUE: Contiguous axial images were obtained from the base of the skull through the vertex without intravenous contrast.  COMPARISON:  CT HEAD W/O CM dated 07/28/2004; CT HEAD W/O CM dated 07/26/2004  FINDINGS: Moderate age related cortical and deep atrophy and severe changes of small vessel disease of the white matter diffusely, progressive since 2005. Low-attenuation in the right side of the midbrain, not present on the prior examination. No mass lesion. No midline shift. No acute hemorrhage or hematoma. No extra-axial fluid collections. No evidence of acute infarction.  No focal osseous abnormality involving the skull. Visualized paranasal sinuses, bilateral mastoid air cells, and  bilateral middle ear cavities well-aerated. Extensive bilateral carotid siphon and vertebral artery atherosclerosis.  IMPRESSION: 1. Age indeterminate nonhemorrhagic stroke involving the right side of the midbrain, new since 2005. MRI of the brain with diffusion imaging may be helpful in determining acuity of this finding. 2. Progressive moderate generalized atrophy and severe chronic microvascular ischemic changes of the white matter since 2005.   Electronically Signed   By: Evangeline Dakin M.D.   On: 12/31/2013 10:24   Mr Jodene Nam Head Wo Contrast  12/31/2013   CLINICAL DATA:  Stroke.  Right arm weakness and slurred speech.  EXAM: MRI HEAD WITHOUT CONTRAST  MRA HEAD WITHOUT CONTRAST  TECHNIQUE: Multiplanar, multiecho pulse sequences of the brain  and surrounding structures were obtained without intravenous contrast. Angiographic images of the head were obtained using MRA technique without contrast.  COMPARISON:  CT head 12/31/2013  FINDINGS: MRI HEAD FINDINGS  Negative for acute infarct. Hypodensity in the right pons on the CT appears to have been artifactual.  Moderate to advanced atrophy. Chronic microvascular ischemic changes throughout the white matter, thalamus, and pons. Chronic hemorrhagic infarct right parietal lobe. Chronic micro hemorrhage left parietal lobe.  Negative for mass or edema.  No shift of the midline structures.  Paranasal sinuses are clear.  MRA HEAD FINDINGS  Right vertebral artery is dominant and widely patent. Mild stenosis distal left vertebral artery. PICA patent bilaterally. Basilar widely patent. Superior cerebellar and posterior cerebral arteries are patent. Mild disease in the posterior cerebral artery bilaterally.  Atherosclerotic irregularity in the cavernous carotid bilaterally with mild stenosis. Anterior and middle cerebral arteries are patent bilaterally without stenosis.  Negative for cerebral aneurysm.  IMPRESSION: No acute infarct.  Atrophy and chronic microvascular ischemia.  Mild intracranial atherosclerotic disease as above.   Electronically Signed   By: Franchot Gallo M.D.   On: 12/31/2013 16:53   Mr Brain Wo Contrast  12/31/2013   CLINICAL DATA:  Stroke.  Right arm weakness and slurred speech.  EXAM: MRI HEAD WITHOUT CONTRAST  MRA HEAD WITHOUT CONTRAST  TECHNIQUE: Multiplanar, multiecho pulse sequences of the brain and surrounding structures were obtained without intravenous contrast. Angiographic images of the head were obtained using MRA technique without contrast.  COMPARISON:  CT head 12/31/2013  FINDINGS: MRI HEAD FINDINGS  Negative for acute infarct. Hypodensity in the right pons on the CT appears to have been artifactual.  Moderate to advanced atrophy. Chronic microvascular ischemic changes throughout  the white matter, thalamus, and pons. Chronic hemorrhagic infarct right parietal lobe. Chronic micro hemorrhage left parietal lobe.  Negative for mass or edema.  No shift of the midline structures.  Paranasal sinuses are clear.  MRA HEAD FINDINGS  Right vertebral artery is dominant and widely patent. Mild stenosis distal left vertebral artery. PICA patent bilaterally. Basilar widely patent. Superior cerebellar and posterior cerebral arteries are patent. Mild disease in the posterior cerebral artery bilaterally.  Atherosclerotic irregularity in the cavernous carotid bilaterally with mild stenosis. Anterior and middle cerebral arteries are patent bilaterally without stenosis.  Negative for cerebral aneurysm.  IMPRESSION: No acute infarct.  Atrophy and chronic microvascular ischemia.  Mild intracranial atherosclerotic disease as above.   Electronically Signed   By: Franchot Gallo M.D.   On: 12/31/2013 16:53   Labs: Basic Metabolic Panel:  Recent Labs Lab 12/31/13 1150 01/01/14 0636  NA 137 146  K 4.1 4.1  CL 98 106  CO2 21 24  GLUCOSE 97 66*  BUN 28* 19  CREATININE 1.17* 1.05  CALCIUM  9.8 9.3   Liver Function Tests:  Recent Labs Lab 01/01/14 0636  AST 41*  ALT 25  ALKPHOS 46  BILITOT 0.4  PROT 8.8*  ALBUMIN 3.5   CBC:  Recent Labs Lab 12/31/13 1150 01/01/14 0636  WBC 7.7 7.1  NEUTROABS 4.4  --   HGB 11.4* 12.0  HCT 34.0* 35.8*  MCV 83.5 82.7  PLT 299 317   CBG:  Recent Labs Lab 12/31/13 2106 01/01/14 0625 01/01/14 0654 01/01/14 0732 01/01/14 1138  GLUCAP 179* 68* 64* 120* 102*    Signed:  Tiane Szydlowski  [facility] 01/01/2014, 3:15 PM

## 2014-01-01 NOTE — Progress Notes (Signed)
Hypoglycemic Event  CBG68  Treatment: 15 GM carbohydrate snack  Symptoms: None  Follow-up CBG: Time 07:36 CBG Result:120  Possible Reasons for Event: Unknown  Comments/MD notified:No    [name]  Remember to initiate Hypoglycemia Order Set & complete

## 2014-01-01 NOTE — Discharge Instructions (Signed)
Transient Ischemic Attack A transient ischemic attack (TIA) is a "warning stroke" that causes stroke-like symptoms. Unlike a stroke, a TIA does not cause permanent damage to the brain. The symptoms of a TIA can happen very fast and do not last long. It is important to know the symptoms of a TIA and what to do. This can help prevent a major stroke or death. CAUSES   A TIA is caused by a temporary blockage in an artery in the brain or neck (carotid artery). The blockage does not allow the brain to get the blood supply it needs and can cause different symptoms. The blockage can be caused by either:  A blood clot.  Fatty buildup (plaque) in a neck or brain artery. RISK FACTORS  High blood pressure (hypertension).  High cholesterol.  Diabetes mellitus.  Heart disease.  The build up of plaque in the blood vessels (peripheral artery disease or atherosclerosis).  The build up of plaque in the blood vessels providing blood and oxygen to the brain (carotid artery stenosis).  An abnormal heart rhythm (atrial fibrillation).  Obesity.  Smoking.  Taking oral contraceptives (especially in combination with smoking).  Physical inactivity.  A diet high in fats, salt (sodium), and calories.  Alcohol use.  Use of illegal drugs (especially cocaine and methamphetamine).  Being female.  Being African American.  Being over the age of 40.  Family history of stroke.  Previous history of blood clots, stroke, TIA, or heart attack.  Sickle cell disease. SYMPTOMS  TIA symptoms are the same as a stroke but are temporary. These symptoms usually develop suddenly, or may be newly present upon awakening from sleep:  Sudden weakness or numbness of the face, arm, or leg, especially on one side of the body.  Sudden trouble walking or difficulty moving arms or legs.  Sudden confusion.  Sudden personality changes.  Trouble speaking (aphasia) or understanding.  Difficulty swallowing.  Sudden  trouble seeing in one or both eyes.  Double vision.  Dizziness.  Loss of balance or coordination.  Sudden severe headache with no known cause.  Trouble reading or writing.  Loss of bowel or bladder control.  Loss of consciousness. DIAGNOSIS  Your caregiver may be able to determine the presence or absence of a TIA based on your symptoms, history, and physical exam. Computed tomography (CT scan) of the brain is usually performed to help identify a TIA. Other tests may be done to diagnose a TIA. These tests may include:  Electrocardiography.  Continuous heart monitoring.  Echocardiography.  Carotid ultrasonography.  Magnetic resonance imaging (MRI).  A scan of the brain circulation.  Blood tests. PREVENTION  The risk of a TIA can be decreased by appropriately treating high blood pressure, high cholesterol, diabetes, heart disease, and obesity and by quitting smoking, limiting alcohol, and staying physically active. TREATMENT  Time is of the essence. Since the symptoms of TIA are the same as a stroke, it is important to seek treatment within 3 4 hours of the start of symptoms because you may receive a medicine to dissolve the clot (thrombolytic) that cannot be given after that time. Treatment options vary. Treatment options may include rest, oxygen, intravenous (IV) fluids, and medicines to thin the blood (anticoagulants). Medicines and diet may be used to address diabetes, high blood pressure, and other risk factors. Measures will be taken to prevent short-term and long-term complications, including infection from breathing foreign material into the lungs (aspiration pneumonia), blood clots in the legs, and falls. Treatment options  include procedures to either remove plaque in the carotid arteries or dilate carotid arteries that have narrowed due to plaque. Those procedures are:  Carotid endarterectomy.  Carotid angioplasty and stenting. HOME CARE INSTRUCTIONS   Take all  medicines prescribed by your caregiver. Follow the directions carefully. Medicines may be used to control risk factors for a stroke. Be sure you understand all your medicine instructions.  You may be told to take aspirin or the anticoagulant warfarin. Warfarin needs to be taken exactly as instructed.  Taking too much or too little warfarin is dangerous. Too much warfarin increases the risk of bleeding. Too little warfarin continues to allow the risk for blood clots. While taking warfarin, you will need to have regular blood tests to measure your blood clotting time. A PT blood test measures how long it takes for blood to clot. Your PT is used to calculate another value called an INR. Your PT and INR help your caregiver to adjust your dose of warfarin. The dose can change for many reasons. It is critically important that you take warfarin exactly as prescribed.  Many foods, especially foods high in vitamin K can interfere with warfarin and affect the PT and INR. Foods high in vitamin K include spinach, kale, broccoli, cabbage, collard and turnip greens, brussels sprouts, peas, cauliflower, seaweed, and parsley as well as beef and pork liver, green tea, and soybean oil. You should eat a consistent amount of foods high in vitamin K. Avoid major changes in your diet, or notify your caregiver before changing your diet. Arrange a visit with a dietitian to answer your questions.  Many medicines can interfere with warfarin and affect the PT and INR. You must tell your caregiver about any and all medicines you take, this includes all vitamins and supplements. Be especially cautious with aspirin and anti-inflammatory medicines. Do not take or discontinue any prescribed or over-the-counter medicine except on the advice of your caregiver or pharmacist.  Warfarin can have side effects, such as excessive bruising or bleeding. You will need to hold pressure over cuts for longer than usual. Your caregiver or pharmacist  will discuss other potential side effects.  Avoid sports or activities that may cause injury or bleeding.  Be mindful when shaving, flossing your teeth, or handling sharp objects.  Alcohol can change the body's ability to handle warfarin. It is best to avoid alcoholic drinks or consume only very small amounts while taking warfarin. Notify your caregiver if you change your alcohol intake.  Notify your dentist or other caregivers before procedures.  Eat a diet that includes 5 or more servings of fruits and vegetables each day. This may reduce the risk of stroke. Certain diets may be prescribed to address high blood pressure, high cholesterol, diabetes, or obesity.  A low-sodium, low-saturated fat, low-trans fat, low-cholesterol diet is recommended to manage high blood pressure.  A low-saturated fat, low-trans fat, low-cholesterol, and high-fiber diet may control cholesterol levels.  A controlled-carbohydrate, controlled-sugar diet is recommended to manage diabetes.  A reduced-calorie, low-sodium, low-saturated fat, low-trans fat, low-cholesterol diet is recommended to manage obesity.  Maintain a healthy weight.  Stay physically active. It is recommended that you get at least 30 minutes of activity on most or all days.  Do not smoke.  Limit alcohol use even if you are not taking warfarin. Moderate alcohol use is considered to be:  No more than 2 drinks each day for men.  No more than 1 drink each day for nonpregnant women.  Stop  drug abuse.  Home safety. A safe home environment is important to reduce the risk of falls. Your caregiver may arrange for specialists to evaluate your home. Having grab bars in the bedroom and bathroom is often important. Your caregiver may arrange for equipment to be used at home, such as raised toilets and a seat for the shower.  Follow all instructions for follow-up with your caregiver. This is very important. This includes any referrals and lab tests.  Proper follow up can prevent a stroke or another TIA from occurring. SEEK MEDICAL CARE IF:  You have personality changes.  You have difficulty swallowing.  You are seeing double.  You have dizziness.  You have a fever.  You have skin breakdown. SEEK IMMEDIATE MEDICAL CARE IF:  Any of these symptoms may represent a serious problem that is an emergency. Do not wait to see if the symptoms will go away. Get medical help right away. Call your local emergency services (911 in [place] Do not drive yourself to the hospital.  You have sudden weakness or numbness of the face, arm, or leg, especially on one side of the body.  You have sudden trouble walking or difficulty moving arms or legs.  You have sudden confusion.  You have trouble speaking (aphasia) or understanding.  You have sudden trouble seeing in one or both eyes.  You have a loss of balance or coordination.  You have a sudden, severe headache with no known cause.  You have new chest pain or an irregular heartbeat.  You have a partial or total loss of consciousness. MAKE SURE YOU:   Understand these instructions.  Will watch your condition.  Will get help right away if you are not doing well or get worse. Document Released: 08/26/2005 Document Revised: 11/02/2012 Document Reviewed: 01/09/2010 [facility] Patient Information 2014 [place].  Stroke Prevention Some medical conditions and behaviors are associated with an increased chance of having a stroke. You may prevent a stroke by making healthy choices and managing medical conditions. HOW CAN I REDUCE MY RISK OF HAVING A STROKE?   Stay physically active. Get at least 30 minutes of activity on most or all days.  Do not smoke. It may also be helpful to avoid exposure to secondhand smoke.  Limit alcohol use. Moderate alcohol use is considered to be:  No more than 2 drinks per day for men.  No more than 1 drink per day for nonpregnant women.  Eat healthy  foods. This involves  Eating 5 or more servings of fruits and vegetables a day.  Following a diet that addresses high blood pressure (hypertension), high cholesterol, diabetes, or obesity.  Manage your cholesterol levels.  A diet low in saturated fat, trans fat, and cholesterol and high in fiber may control cholesterol levels.  Take any prescribed medicines to control cholesterol as directed by your health care provider.  Manage your diabetes.  A controlled-carbohydrate, controlled-sugar diet is recommended to manage diabetes.  Take any prescribed medicines to control diabetes as directed by your health care provider.  Control your hypertension.  A low-salt (sodium), low-saturated fat, low-trans fat, and low-cholesterol diet is recommended to manage hypertension.  Take any prescribed medicines to control hypertension as directed by your health care provider.  Maintain a healthy weight.  A reduced-calorie, low-sodium, low-saturated fat, low-trans fat, low-cholesterol diet is recommended to manage weight.  Stop drug abuse.  Avoid taking birth control pills.  Talk to your health care provider about the risks of taking birth control  pills if you are over 58 years old, smoke, get migraines, or have ever had a blood clot.  Get evaluated for sleep disorders (sleep apnea).  Talk to your health care provider about getting a sleep evaluation if you snore a lot or have excessive sleepiness.  Take medicines as directed by your health care provider.  For some people, aspirin or blood thinners (anticoagulants) are helpful in reducing the risk of forming abnormal blood clots that can lead to stroke. If you have the irregular heart rhythm of atrial fibrillation, you should be on a blood thinner unless there is a good reason you cannot take them.  Understand all your medicine instructions.  Make sure that other other conditions (such as anemia or atherosclerosis) are addressed. SEEK  IMMEDIATE MEDICAL CARE IF:   You have sudden weakness or numbness of the face, arm, or leg, especially on one side of the body.  Your face or eyelid droops to one side.  You have sudden confusion.  You have trouble speaking (aphasia) or understanding.  You have sudden trouble seeing in one or both eyes.  You have sudden trouble walking.  You have dizziness.  You have a loss of balance or coordination.  You have a sudden, severe headache with no known cause.  You have new chest pain or an irregular heartbeat. Any of these symptoms may represent a serious problem that is an emergency. Do not wait to see if the symptoms will go away. Get medical help at once. Call your local emergency services  (911 in [place] Do not drive yourself to the hospital. Document Released: 12/24/2004 Document Revised: 09/06/2013 Document Reviewed: 05/19/2013 [facility] Patient Information 2014 [place].

## 2014-01-01 NOTE — Progress Notes (Signed)
Echo Lab  2D Echocardiogram completed.  [place], RDCS 01/01/2014 9:54 AM

## 2014-01-01 NOTE — Progress Notes (Signed)
Discharge orders received, pt for discharge home today.  IV and telemtry D/C,   D/C instructions and Rx given with verbalized understanding.  Family at bedside to assist pt with discharge. Staff brought pt downstairs via wheelchair.

## 2014-01-02 NOTE — Progress Notes (Signed)
UR complete.  Kirstan Fentress RN, MSN 

## 2014-01-29 ENCOUNTER — Encounter: Payer: Self-pay | Admitting: Podiatry

## 2014-01-29 ENCOUNTER — Ambulatory Visit (INDEPENDENT_AMBULATORY_CARE_PROVIDER_SITE_OTHER): Payer: Medicare Other | Admitting: Podiatry

## 2014-01-29 VITALS — BP 146/78 | HR 74 | Resp 12

## 2014-01-29 DIAGNOSIS — E1159 Type 2 diabetes mellitus with other circulatory complications: Secondary | ICD-10-CM

## 2014-01-29 DIAGNOSIS — L84 Corns and callosities: Secondary | ICD-10-CM

## 2014-01-29 DIAGNOSIS — B351 Tinea unguium: Secondary | ICD-10-CM

## 2014-01-29 DIAGNOSIS — M79609 Pain in unspecified limb: Secondary | ICD-10-CM

## 2014-01-29 NOTE — Patient Instructions (Signed)
Diabetes and Foot Care Diabetes may cause you to have problems because of poor blood supply (circulation) to your feet and legs. This may cause the skin on your feet to become thinner, break easier, and heal more slowly. Your skin may become dry, and the skin may peel and crack. You may also have nerve damage in your legs and feet causing decreased feeling in them. You may not notice minor injuries to your feet that could lead to infections or more serious problems. Taking care of your feet is one of the most important things you can do for yourself.  HOME CARE INSTRUCTIONS  Wear shoes at all times, even in the house. Do not go barefoot. Bare feet are easily injured.  Check your feet daily for blisters, cuts, and redness. If you cannot see the bottom of your feet, use a mirror or ask someone for help.  Wash your feet with warm water (do not use hot water) and mild soap. Then pat your feet and the areas between your toes until they are completely dry. Do not soak your feet as this can dry your skin.  Apply a moisturizing lotion or petroleum jelly (that does not contain alcohol and is unscented) to the skin on your feet and to dry, brittle toenails. Do not apply lotion between your toes.  Trim your toenails straight across. Do not dig under them or around the cuticle. File the edges of your nails with an emery board or nail file.  Do not cut corns or calluses or try to remove them with medicine.  Wear clean socks or stockings every day. Make sure they are not too tight. Do not wear knee-high stockings since they may decrease blood flow to your legs.  Wear shoes that fit properly and have enough cushioning. To break in new shoes, wear them for just a few hours a day. This prevents you from injuring your feet. Always look in your shoes before you put them on to be sure there are no objects inside.  Do not cross your legs. This may decrease the blood flow to your feet.  If you find a minor scrape,  cut, or break in the skin on your feet, keep it and the skin around it clean and dry. These areas may be cleansed with mild soap and water. Do not cleanse the area with peroxide, alcohol, or iodine.  When you remove an adhesive bandage, be sure not to damage the skin around it.  If you have a wound, look at it several times a day to make sure it is healing.  Do not use heating pads or hot water bottles. They may burn your skin. If you have lost feeling in your feet or legs, you may not know it is happening until it is too late.  Make sure your health care provider performs a complete foot exam at least annually or more often if you have foot problems. Report any cuts, sores, or bruises to your health care provider immediately. SEEK MEDICAL CARE IF:   You have an injury that is not healing.  You have cuts or breaks in the skin.  You have an ingrown nail.  You notice redness on your legs or feet.  You feel burning or tingling in your legs or feet.  You have pain or cramps in your legs and feet.  Your legs or feet are numb.  Your feet always feel cold. SEEK IMMEDIATE MEDICAL CARE IF:   There is increasing redness,   swelling, or pain in or around a wound.  There is a red line that goes up your leg.  Pus is coming from a wound.  You develop a fever or as directed by your health care provider.  You notice a bad smell coming from an ulcer or wound. Document Released: 11/13/2000 Document Revised: 07/19/2013 Document Reviewed: 04/25/2013 [employer] Patient Information 2014 [employer].  

## 2014-01-30 NOTE — Progress Notes (Signed)
Patient ID: [name], female   DOB: 07/11/1926, 80 y.o.   MRN: [id number]  Subjective: This patient says today complaining of painful toenails and calluses. The last visit were similar visit was on 11/06/2013.  Objective: The keratoses noted on the fifth right toe in the interdigital spaces in the hallux second toes bilaterally and a plantar callus left. The nails are hypertrophic, elongated, discolored and tender to palpation  Assessment: Symptomatic onychomycoses x10 Multiple keratoses right and left  Plan: Nails and keratoses debrided back without a bleeding. Reappoint at three-month intervals.

## 2014-06-04 ENCOUNTER — Encounter: Payer: Self-pay | Admitting: Podiatry

## 2014-06-04 ENCOUNTER — Ambulatory Visit (INDEPENDENT_AMBULATORY_CARE_PROVIDER_SITE_OTHER): Payer: Medicare Other | Admitting: Podiatry

## 2014-06-04 VITALS — BP 151/70 | HR 72 | Resp 12

## 2014-06-04 DIAGNOSIS — M79609 Pain in unspecified limb: Secondary | ICD-10-CM

## 2014-06-04 DIAGNOSIS — L84 Corns and callosities: Secondary | ICD-10-CM

## 2014-06-04 DIAGNOSIS — M79673 Pain in unspecified foot: Secondary | ICD-10-CM

## 2014-06-04 DIAGNOSIS — B351 Tinea unguium: Secondary | ICD-10-CM

## 2014-06-04 NOTE — Progress Notes (Signed)
Patient ID: [name], female   DOB: Nov 07, 1926, 80 y.o.   MRN: [id number]  Subjective: This patient presents for ongoing debridement of painful toenails and painful plantar calluses  Objective: Orientated x3 female Elongated, incurvated, hypertrophic toenails x10 Keratoses fifth right toe Multiple plantar keratoses right and left Interdigital keratoses hallux and second toes bilaterally, are improving with toe separators and do not need debridement today  Assessment: Symptomatic onychomycoses 6-10 Keratoses multiple  Plan: Nails x10 and keratoses x4 debrided without a bleeding  Reappoint at three-month intervals

## 2014-07-13 ENCOUNTER — Emergency Department (INDEPENDENT_AMBULATORY_CARE_PROVIDER_SITE_OTHER)
Admission: EM | Admit: 2014-07-13 | Discharge: 2014-07-13 | Disposition: A | Discharge status: Home / Self Care | Payer: Medicare Other | Source: Home / Self Care | Attending: Family Medicine | Admitting: Family Medicine

## 2014-07-13 ENCOUNTER — Encounter (HOSPITAL_COMMUNITY): Payer: Self-pay | Admitting: Emergency Medicine

## 2014-07-13 DIAGNOSIS — K5901 Slow transit constipation: Secondary | ICD-10-CM

## 2014-07-13 DIAGNOSIS — J300 Vasomotor rhinitis: Secondary | ICD-10-CM

## 2014-07-13 DIAGNOSIS — J309 Allergic rhinitis, unspecified: Secondary | ICD-10-CM

## 2014-07-13 DIAGNOSIS — K219 Gastro-esophageal reflux disease without esophagitis: Secondary | ICD-10-CM

## 2014-07-13 MED ORDER — PANTOPRAZOLE SODIUM 20 MG PO TBEC: 20.0000 mg | DELAYED_RELEASE_TABLET | Freq: Every day | ORAL | Status: AC

## 2014-07-13 MED ORDER — IPRATROPIUM BROMIDE 0.06 % NA SOLN: 1.0000 | Freq: Four times a day (QID) | NASAL | Status: DC

## 2014-07-13 NOTE — ED Notes (Signed)
Pt  Has   Symptoms  Of    Acid  Reflux       And         Eyes  Are  Irritated  And  Watery  As  Well  As  Some  Constipation as  Well    Pt   Ambulated to  Room with  Slow  Steady  Gait

## 2014-07-13 NOTE — Discharge Instructions (Signed)
Use medicine as prescribed, drink more water, use probiotic daily, see your doctor as needed. Avoid fans blowing on you in car and bedroom at night.

## 2014-07-13 NOTE — ED Provider Notes (Signed)
CSN: [id number]     Arrival date & time 07/13/14  7 History   First MD Initiated Contact with Patient 07/13/14 1603     Chief Complaint  Patient presents with  . Gastrophageal Reflux   (Consider location/radiation/quality/duration/timing/severity/associated sxs/prior Treatment) Patient is a 80 y.o. female presenting with GERD. The history is provided by the patient.  Gastrophageal Reflux This is a chronic problem. The current episode started more than 1 week ago. The problem has been gradually worsening. Pertinent negatives include no chest pain, no abdominal pain and no shortness of breath.    Past Medical History  Diagnosis Date  . Hypertension   . Diabetes mellitus without complication   . Dyslipidemia   . Constipation   . Anemia    Past Surgical History  Procedure Laterality Date  . Abdominal hysterectomy    . Multiple tooth extractions     History reviewed. No pertinent family history. History  Substance Use Topics  . Smoking status: Never Smoker   . Smokeless tobacco: Never Used  . Alcohol Use: No   OB History   Grav Para Term Preterm Abortions TAB SAB Ect Mult Living                 Review of Systems  Constitutional: Negative.  Negative for fever.  HENT: Positive for congestion and rhinorrhea.   Respiratory: Positive for cough. Negative for shortness of breath.   Cardiovascular: Negative for chest pain.  Gastrointestinal: Positive for constipation. Negative for abdominal pain.  Genitourinary: Negative.     Allergies  Review of patient's allergies indicates no known allergies.  Home Medications   Prior to Admission medications   Medication Sig Start Date End Date Taking? Authorizing Provider  amLODipine (NORVASC) 10 MG tablet Take 10 mg by mouth daily.    Historical Provider, MD  atorvastatin (LIPITOR) 10 MG tablet Take 10 mg by mouth daily.    Historical Provider, MD  Calcium Carbonate (CALCIUM 500 PO) Take by mouth.    Historical Provider, MD   carvedilol (COREG) 6.25 MG tablet Take 6.25 mg by mouth at bedtime.    Historical Provider, MD  Cholecalciferol (VITAMIN D-3 PO) Take 1,000 Units by mouth daily.     Historical Provider, MD  clopidogrel (PLAVIX) 75 MG tablet Take 1 tablet (75 mg total) by mouth daily with breakfast. 01/01/14   Barton Dubois, MD  glimepiride (AMARYL) 4 MG tablet Take 4 mg by mouth daily with breakfast.    Historical Provider, MD  ipratropium (ATROVENT) 0.06 % nasal spray Place 1 spray into both nostrils 4 (four) times daily. 07/13/14   Billy Fischer, MD  metFORMIN (GLUCOPHAGE) 1000 MG tablet Take 1,000 mg by mouth 2 (two) times daily with a meal.    Historical Provider, MD  pantoprazole (PROTONIX) 20 MG tablet Take 1 tablet (20 mg total) by mouth daily. 07/13/14   Billy Fischer, MD  polyethylene glycol (MIRALAX / [name]) packet Take 17 g by mouth daily.    Historical Provider, MD  valsartan-hydrochlorothiazide (DIOVAN-HCT) 320-25 MG per tablet Take 1 tablet by mouth daily.    Historical Provider, MD   BP 183/74  Pulse 84  Temp(Src) 98.3 F (36.8 C) (Oral)  Resp 16  SpO2 98% Physical Exam  Nursing note and vitals reviewed. Constitutional: She is oriented to person, place, and time. She appears well-developed and well-nourished.  HENT:  Right Ear: External ear normal.  Left Ear: External ear normal.  Mouth/Throat: Oropharynx is clear and moist.  Eyes:  Conjunctivae are normal. Pupils are equal, round, and reactive to light.  Neck: Normal range of motion. Neck supple.  Cardiovascular: Normal heart sounds.   Pulmonary/Chest: Breath sounds normal.  Lymphadenopathy:    She has no cervical adenopathy.  Neurological: She is alert and oriented to person, place, and time.  Skin: Skin is warm and dry.    ED Course  Procedures (including critical care time) Labs Review Labs Reviewed - No data to display  Imaging Review No results found.   MDM   1. Vasomotor rhinitis   2. Gastroesophageal reflux disease  without esophagitis   3. Slow transit constipation        Billy Fischer, MD 07/14/14 1248

## 2014-09-10 ENCOUNTER — Ambulatory Visit (INDEPENDENT_AMBULATORY_CARE_PROVIDER_SITE_OTHER): Payer: Medicare Other | Admitting: Podiatry

## 2014-09-10 DIAGNOSIS — B351 Tinea unguium: Secondary | ICD-10-CM

## 2014-09-10 DIAGNOSIS — L84 Corns and callosities: Secondary | ICD-10-CM

## 2014-09-10 DIAGNOSIS — M79676 Pain in unspecified toe(s): Secondary | ICD-10-CM

## 2014-09-10 NOTE — Progress Notes (Signed)
   Subjective:    Patient ID: [name], female    DOB: 1926/07/30, 80 y.o.   MRN: [id number]  HPI This patient presents complaining of painful toenails and painful plantar calluses   Review of Systems  All other systems reviewed and are negative.      Objective:   Physical Exam  Orientated x3 white female The toenails are elongated, hypertrophic, discolored, incurvated 6-10 Plantar keratoses sub-fifth, first, second right MPJs Plantar keratoses second left MPJ      Assessment & Plan:   Assessment: Symptomatic onychomycoses 6-10 Keratoses x4  Plan: Nails x10 and keratoses x4 debrided without a bleeding  Reappoint x3 months

## 2014-12-10 ENCOUNTER — Ambulatory Visit (INDEPENDENT_AMBULATORY_CARE_PROVIDER_SITE_OTHER): Payer: Medicare Other | Admitting: Podiatry

## 2014-12-10 DIAGNOSIS — L84 Corns and callosities: Secondary | ICD-10-CM | POA: Diagnosis not present

## 2014-12-10 DIAGNOSIS — B351 Tinea unguium: Secondary | ICD-10-CM | POA: Diagnosis not present

## 2014-12-10 DIAGNOSIS — M79676 Pain in unspecified toe(s): Secondary | ICD-10-CM

## 2014-12-10 NOTE — Patient Instructions (Signed)
Diabetes and Foot Care Diabetes may cause you to have problems because of poor blood supply (circulation) to your feet and legs. This may cause the skin on your feet to become thinner, break easier, and heal more slowly. Your skin may become dry, and the skin may peel and crack. You may also have nerve damage in your legs and feet causing decreased feeling in them. You may not notice minor injuries to your feet that could lead to infections or more serious problems. Taking care of your feet is one of the most important things you can do for yourself.  HOME CARE INSTRUCTIONS  Wear shoes at all times, even in the house. Do not go barefoot. Bare feet are easily injured.  Check your feet daily for blisters, cuts, and redness. If you cannot see the bottom of your feet, use a mirror or ask someone for help.  Wash your feet with warm water (do not use hot water) and mild soap. Then pat your feet and the areas between your toes until they are completely dry. Do not soak your feet as this can dry your skin.  Apply a moisturizing lotion or petroleum jelly (that does not contain alcohol and is unscented) to the skin on your feet and to dry, brittle toenails. Do not apply lotion between your toes.  Trim your toenails straight across. Do not dig under them or around the cuticle. File the edges of your nails with an emery board or nail file.  Do not cut corns or calluses or try to remove them with medicine.  Wear clean socks or stockings every day. Make sure they are not too tight. Do not wear knee-high stockings since they may decrease blood flow to your legs.  Wear shoes that fit properly and have enough cushioning. To break in new shoes, wear them for just a few hours a day. This prevents you from injuring your feet. Always look in your shoes before you put them on to be sure there are no objects inside.  Do not cross your legs. This may decrease the blood flow to your feet.  If you find a minor scrape,  cut, or break in the skin on your feet, keep it and the skin around it clean and dry. These areas may be cleansed with mild soap and water. Do not cleanse the area with peroxide, alcohol, or iodine.  When you remove an adhesive bandage, be sure not to damage the skin around it.  If you have a wound, look at it several times a day to make sure it is healing.  Do not use heating pads or hot water bottles. They may burn your skin. If you have lost feeling in your feet or legs, you may not know it is happening until it is too late.  Make sure your health care provider performs a complete foot exam at least annually or more often if you have foot problems. Report any cuts, sores, or bruises to your health care provider immediately. SEEK MEDICAL CARE IF:   You have an injury that is not healing.  You have cuts or breaks in the skin.  You have an ingrown nail.  You notice redness on your legs or feet.  You feel burning or tingling in your legs or feet.  You have pain or cramps in your legs and feet.  Your legs or feet are numb.  Your feet always feel cold. SEEK IMMEDIATE MEDICAL CARE IF:   There is increasing redness,   swelling, or pain in or around a wound.  There is a red line that goes up your leg.  Pus is coming from a wound.  You develop a fever or as directed by your health care provider.  You notice a bad smell coming from an ulcer or wound. Document Released: 11/13/2000 Document Revised: 07/19/2013 Document Reviewed: 04/25/2013 [employer] Patient Information 2015 [employer]. This information is not intended to replace advice given to you by your health care provider. Make sure you discuss any questions you have with your health care provider.  

## 2014-12-10 NOTE — Progress Notes (Signed)
Patient ID: [name], female   DOB: 1926-02-26, 81 y.o.   MRN: [id number]  Subjective: This patient presents again complaining of painful toenails walking wearing shoes and painful keratoses multiple sites  Objective: The toenails are hypertrophic, elongated, incurvated and tender to palpation 6-10 Interdigital keratoses hallux and second toes bilaterally left more symptomatic than right Plantar keratoses sub-second MPJ left  Assessment: Type II diabetic Symptomatic onychomycoses 6-10 Keratoses 5  HAV deformity bilaterally, causing friction keratoses between hallux and second toes bilaterally    Plan:  Debridement toenails 10 and keratoses 5 without any bleeding Dispensed gel toe separator to insert between left hallux and second left toe

## 2015-01-15 DIAGNOSIS — E780 Pure hypercholesterolemia: Secondary | ICD-10-CM | POA: Diagnosis not present

## 2015-01-15 DIAGNOSIS — Z0000 Encounter for general adult medical examination without abnormal findings: Secondary | ICD-10-CM | POA: Diagnosis not present

## 2015-01-15 DIAGNOSIS — E1100 Type 2 diabetes mellitus with hyperosmolarity without nonketotic hyperglycemic-hyperosmolar coma (NKHHC): Secondary | ICD-10-CM | POA: Diagnosis not present

## 2015-01-15 DIAGNOSIS — I10 Essential (primary) hypertension: Secondary | ICD-10-CM | POA: Diagnosis not present

## 2015-03-04 ENCOUNTER — Ambulatory Visit (INDEPENDENT_AMBULATORY_CARE_PROVIDER_SITE_OTHER): Payer: Medicare Other | Admitting: Podiatry

## 2015-03-04 ENCOUNTER — Encounter: Payer: Self-pay | Admitting: Podiatry

## 2015-03-04 DIAGNOSIS — E119 Type 2 diabetes mellitus without complications: Secondary | ICD-10-CM | POA: Diagnosis not present

## 2015-03-04 DIAGNOSIS — B351 Tinea unguium: Secondary | ICD-10-CM | POA: Diagnosis not present

## 2015-03-04 DIAGNOSIS — M79676 Pain in unspecified toe(s): Secondary | ICD-10-CM | POA: Diagnosis not present

## 2015-03-04 DIAGNOSIS — L84 Corns and callosities: Secondary | ICD-10-CM | POA: Diagnosis not present

## 2015-03-04 NOTE — Patient Instructions (Signed)
Diabetes and Foot Care Diabetes may cause you to have problems because of poor blood supply (circulation) to your feet and legs. This may cause the skin on your feet to become thinner, break easier, and heal more slowly. Your skin may become dry, and the skin may peel and crack. You may also have nerve damage in your legs and feet causing decreased feeling in them. You may not notice minor injuries to your feet that could lead to infections or more serious problems. Taking care of your feet is one of the most important things you can do for yourself.  HOME CARE INSTRUCTIONS  Wear shoes at all times, even in the house. Do not go barefoot. Bare feet are easily injured.  Check your feet daily for blisters, cuts, and redness. If you cannot see the bottom of your feet, use a mirror or ask someone for help.  Wash your feet with warm water (do not use hot water) and mild soap. Then pat your feet and the areas between your toes until they are completely dry. Do not soak your feet as this can dry your skin.  Apply a moisturizing lotion or petroleum jelly (that does not contain alcohol and is unscented) to the skin on your feet and to dry, brittle toenails. Do not apply lotion between your toes.  Trim your toenails straight across. Do not dig under them or around the cuticle. File the edges of your nails with an emery board or nail file.  Do not cut corns or calluses or try to remove them with medicine.  Wear clean socks or stockings every day. Make sure they are not too tight. Do not wear knee-high stockings since they may decrease blood flow to your legs.  Wear shoes that fit properly and have enough cushioning. To break in new shoes, wear them for just a few hours a day. This prevents you from injuring your feet. Always look in your shoes before you put them on to be sure there are no objects inside.  Do not cross your legs. This may decrease the blood flow to your feet.  If you find a minor scrape,  cut, or break in the skin on your feet, keep it and the skin around it clean and dry. These areas may be cleansed with mild soap and water. Do not cleanse the area with peroxide, alcohol, or iodine.  When you remove an adhesive bandage, be sure not to damage the skin around it.  If you have a wound, look at it several times a day to make sure it is healing.  Do not use heating pads or hot water bottles. They may burn your skin. If you have lost feeling in your feet or legs, you may not know it is happening until it is too late.  Make sure your health care provider performs a complete foot exam at least annually or more often if you have foot problems. Report any cuts, sores, or bruises to your health care provider immediately. SEEK MEDICAL CARE IF:   You have an injury that is not healing.  You have cuts or breaks in the skin.  You have an ingrown nail.  You notice redness on your legs or feet.  You feel burning or tingling in your legs or feet.  You have pain or cramps in your legs and feet.  Your legs or feet are numb.  Your feet always feel cold. SEEK IMMEDIATE MEDICAL CARE IF:   There is increasing redness,   swelling, or pain in or around a wound.  There is a red line that goes up your leg.  Pus is coming from a wound.  You develop a fever or as directed by your health care provider.  You notice a bad smell coming from an ulcer or wound. Document Released: 11/13/2000 Document Revised: 07/19/2013 Document Reviewed: 04/25/2013 [employer] Patient Information 2015 [employer]. This information is not intended to replace advice given to you by your health care provider. Make sure you discuss any questions you have with your health care provider.  

## 2015-03-05 NOTE — Progress Notes (Signed)
Patient ID: [name], female   DOB: [birth year], 81 y.o.   MRN: [id number]  Subjective:  Presents complaining of painful toenails and painful keratoses and we discussed skin a nail debridement  Objective: The toenails are hypertrophic, elongated, incurvated and tender to palpation 6-10 Keratoses interdigital hallux and second toes bilaterally and subsecond MPJ left and fifth toe right  Assessment: Symptomatic onychomycoses 6-10 Keratoses 6 Diabetic type II  Plan: Debridement toenails 10 and keratoses 6 without any bleeding  Maintain gel toe separator between hallux and second left toes  Reappoint 3 months

## 2015-03-14 DIAGNOSIS — E119 Type 2 diabetes mellitus without complications: Secondary | ICD-10-CM | POA: Diagnosis not present

## 2015-03-14 DIAGNOSIS — Z961 Presence of intraocular lens: Secondary | ICD-10-CM | POA: Diagnosis not present

## 2015-03-14 DIAGNOSIS — H10413 Chronic giant papillary conjunctivitis, bilateral: Secondary | ICD-10-CM | POA: Diagnosis not present

## 2015-03-14 DIAGNOSIS — H04123 Dry eye syndrome of bilateral lacrimal glands: Secondary | ICD-10-CM | POA: Diagnosis not present

## 2015-05-16 DIAGNOSIS — E1100 Type 2 diabetes mellitus with hyperosmolarity without nonketotic hyperglycemic-hyperosmolar coma (NKHHC): Secondary | ICD-10-CM | POA: Diagnosis not present

## 2015-05-16 DIAGNOSIS — R634 Abnormal weight loss: Secondary | ICD-10-CM | POA: Diagnosis not present

## 2015-05-16 DIAGNOSIS — E780 Pure hypercholesterolemia: Secondary | ICD-10-CM | POA: Diagnosis not present

## 2015-05-16 DIAGNOSIS — I10 Essential (primary) hypertension: Secondary | ICD-10-CM | POA: Diagnosis not present

## 2015-06-10 ENCOUNTER — Ambulatory Visit: Payer: Medicare Other | Admitting: Podiatry

## 2015-06-18 ENCOUNTER — Encounter: Payer: Self-pay | Admitting: Podiatry

## 2015-06-18 ENCOUNTER — Ambulatory Visit (INDEPENDENT_AMBULATORY_CARE_PROVIDER_SITE_OTHER): Payer: Medicare Other | Admitting: Podiatry

## 2015-06-18 DIAGNOSIS — B351 Tinea unguium: Secondary | ICD-10-CM | POA: Diagnosis not present

## 2015-06-18 DIAGNOSIS — M79676 Pain in unspecified toe(s): Secondary | ICD-10-CM | POA: Diagnosis not present

## 2015-06-18 DIAGNOSIS — L84 Corns and callosities: Secondary | ICD-10-CM

## 2015-06-18 DIAGNOSIS — E119 Type 2 diabetes mellitus without complications: Secondary | ICD-10-CM | POA: Diagnosis not present

## 2015-06-18 DIAGNOSIS — M79673 Pain in unspecified foot: Secondary | ICD-10-CM

## 2015-06-18 NOTE — Progress Notes (Signed)
Patient ID: [name], female   DOB: [birth year], 81 y.o.   MRN: [id number]  Subjective: Patient presents for a scheduled visit complaining of painful toenails and keratoses and requests debridement  Objective: The toenails are hypertrophic, elongated, discolored, incurvated and tender to direct palpation 6-10 Interdigital keratoses first and second toes bilaterally, fifth right toe and plantar second MPJ  Assessment: Symptomatic onychomycoses 6-10 Keratoses 6 Type II diabetic  Plan: Debridement toenails 10 and mechanically and electrically without any bleeding Debrided keratoses 6 without any bleeding  Patient advised to wear toe gel toe separators. Hallux and second toes bilaterally  Reappoint 3 months

## 2015-06-18 NOTE — Patient Instructions (Signed)
Diabetes and Foot Care Diabetes may cause you to have problems because of poor blood supply (circulation) to your feet and legs. This may cause the skin on your feet to become thinner, break easier, and heal more slowly. Your skin may become dry, and the skin may peel and crack. You may also have nerve damage in your legs and feet causing decreased feeling in them. You may not notice minor injuries to your feet that could lead to infections or more serious problems. Taking care of your feet is one of the most important things you can do for yourself.  HOME CARE INSTRUCTIONS  Wear shoes at all times, even in the house. Do not go barefoot. Bare feet are easily injured.  Check your feet daily for blisters, cuts, and redness. If you cannot see the bottom of your feet, use a mirror or ask someone for help.  Wash your feet with warm water (do not use hot water) and mild soap. Then pat your feet and the areas between your toes until they are completely dry. Do not soak your feet as this can dry your skin.  Apply a moisturizing lotion or petroleum jelly (that does not contain alcohol and is unscented) to the skin on your feet and to dry, brittle toenails. Do not apply lotion between your toes.  Trim your toenails straight across. Do not dig under them or around the cuticle. File the edges of your nails with an emery board or nail file.  Do not cut corns or calluses or try to remove them with medicine.  Wear clean socks or stockings every day. Make sure they are not too tight. Do not wear knee-high stockings since they may decrease blood flow to your legs.  Wear shoes that fit properly and have enough cushioning. To break in new shoes, wear them for just a few hours a day. This prevents you from injuring your feet. Always look in your shoes before you put them on to be sure there are no objects inside.  Do not cross your legs. This may decrease the blood flow to your feet.  If you find a minor scrape,  cut, or break in the skin on your feet, keep it and the skin around it clean and dry. These areas may be cleansed with mild soap and water. Do not cleanse the area with peroxide, alcohol, or iodine.  When you remove an adhesive bandage, be sure not to damage the skin around it.  If you have a wound, look at it several times a day to make sure it is healing.  Do not use heating pads or hot water bottles. They may burn your skin. If you have lost feeling in your feet or legs, you may not know it is happening until it is too late.  Make sure your health care provider performs a complete foot exam at least annually or more often if you have foot problems. Report any cuts, sores, or bruises to your health care provider immediately. SEEK MEDICAL CARE IF:   You have an injury that is not healing.  You have cuts or breaks in the skin.  You have an ingrown nail.  You notice redness on your legs or feet.  You feel burning or tingling in your legs or feet.  You have pain or cramps in your legs and feet.  Your legs or feet are numb.  Your feet always feel cold. SEEK IMMEDIATE MEDICAL CARE IF:   There is increasing redness,   swelling, or pain in or around a wound.  There is a red line that goes up your leg.  Pus is coming from a wound.  You develop a fever or as directed by your health care provider.  You notice a bad smell coming from an ulcer or wound. Document Released: 11/13/2000 Document Revised: 07/19/2013 Document Reviewed: 04/25/2013 [employer] Patient Information 2015 [employer]. This information is not intended to replace advice given to you by your health care provider. Make sure you discuss any questions you have with your health care provider.  

## 2015-06-20 DIAGNOSIS — R634 Abnormal weight loss: Secondary | ICD-10-CM | POA: Diagnosis not present

## 2015-08-07 DIAGNOSIS — E1100 Type 2 diabetes mellitus with hyperosmolarity without nonketotic hyperglycemic-hyperosmolar coma (NKHHC): Secondary | ICD-10-CM | POA: Diagnosis not present

## 2015-08-07 DIAGNOSIS — E780 Pure hypercholesterolemia: Secondary | ICD-10-CM | POA: Diagnosis not present

## 2015-08-07 DIAGNOSIS — I10 Essential (primary) hypertension: Secondary | ICD-10-CM | POA: Diagnosis not present

## 2015-08-07 DIAGNOSIS — R634 Abnormal weight loss: Secondary | ICD-10-CM | POA: Diagnosis not present

## 2015-09-18 ENCOUNTER — Encounter: Payer: Self-pay | Admitting: Podiatry

## 2015-09-18 ENCOUNTER — Ambulatory Visit (INDEPENDENT_AMBULATORY_CARE_PROVIDER_SITE_OTHER): Payer: Medicare Other | Admitting: Podiatry

## 2015-09-18 DIAGNOSIS — M79676 Pain in unspecified toe(s): Secondary | ICD-10-CM | POA: Diagnosis not present

## 2015-09-18 DIAGNOSIS — B351 Tinea unguium: Secondary | ICD-10-CM | POA: Diagnosis not present

## 2015-09-18 DIAGNOSIS — E119 Type 2 diabetes mellitus without complications: Secondary | ICD-10-CM | POA: Diagnosis not present

## 2015-09-18 DIAGNOSIS — L84 Corns and callosities: Secondary | ICD-10-CM

## 2015-09-18 NOTE — Patient Instructions (Signed)
Diabetes and Foot Care Diabetes may cause you to have problems because of poor blood supply (circulation) to your feet and legs. This may cause the skin on your feet to become thinner, break easier, and heal more slowly. Your skin may become dry, and the skin may peel and crack. You may also have nerve damage in your legs and feet causing decreased feeling in them. You may not notice minor injuries to your feet that could lead to infections or more serious problems. Taking care of your feet is one of the most important things you can do for yourself.  HOME CARE INSTRUCTIONS  Wear shoes at all times, even in the house. Do not go barefoot. Bare feet are easily injured.  Check your feet daily for blisters, cuts, and redness. If you cannot see the bottom of your feet, use a mirror or ask someone for help.  Wash your feet with warm water (do not use hot water) and mild soap. Then pat your feet and the areas between your toes until they are completely dry. Do not soak your feet as this can dry your skin.  Apply a moisturizing lotion or petroleum jelly (that does not contain alcohol and is unscented) to the skin on your feet and to dry, brittle toenails. Do not apply lotion between your toes.  Trim your toenails straight across. Do not dig under them or around the cuticle. File the edges of your nails with an emery board or nail file.  Do not cut corns or calluses or try to remove them with medicine.  Wear clean socks or stockings every day. Make sure they are not too tight. Do not wear knee-high stockings since they may decrease blood flow to your legs.  Wear shoes that fit properly and have enough cushioning. To break in new shoes, wear them for just a few hours a day. This prevents you from injuring your feet. Always look in your shoes before you put them on to be sure there are no objects inside.  Do not cross your legs. This may decrease the blood flow to your feet.  If you find a minor scrape,  cut, or break in the skin on your feet, keep it and the skin around it clean and dry. These areas may be cleansed with mild soap and water. Do not cleanse the area with peroxide, alcohol, or iodine.  When you remove an adhesive bandage, be sure not to damage the skin around it.  If you have a wound, look at it several times a day to make sure it is healing.  Do not use heating pads or hot water bottles. They may burn your skin. If you have lost feeling in your feet or legs, you may not know it is happening until it is too late.  Make sure your health care provider performs a complete foot exam at least annually or more often if you have foot problems. Report any cuts, sores, or bruises to your health care provider immediately. SEEK MEDICAL CARE IF:   You have an injury that is not healing.  You have cuts or breaks in the skin.  You have an ingrown nail.  You notice redness on your legs or feet.  You feel burning or tingling in your legs or feet.  You have pain or cramps in your legs and feet.  Your legs or feet are numb.  Your feet always feel cold. SEEK IMMEDIATE MEDICAL CARE IF:   There is increasing redness,   swelling, or pain in or around a wound.  There is a red line that goes up your leg.  Pus is coming from a wound.  You develop a fever or as directed by your health care provider.  You notice a bad smell coming from an ulcer or wound.   This information is not intended to replace advice given to you by your health care provider. Make sure you discuss any questions you have with your health care provider.   Document Released: 11/13/2000 Document Revised: 07/19/2013 Document Reviewed: 04/25/2013 Elsevier Interactive Patient Education 2016 [employer]  

## 2015-09-18 NOTE — Progress Notes (Signed)
Patient ID: [name], female   DOB: 04/04/1926, 81 y.o.   MRN: [id number]  Subjective: This patient presents for a scheduled visit complaining of painful toenails walking wearing shoes and painful keratoses on the right and left feet  Objective: Orientated 3 No open skin lesions bilaterally Corns on the lateral aspect of the hallux and medial border of the second toes, bilaterally 4 Corn fifth right toe The toenails are elongated, brittle, hypertrophic, deformed and tender to direct palpation 6-10  Assessment: Symptomatic onychomycoses 6-10 Keratoses 5 Diabetic type II  Plan: Debrided toenails 10 mechanically and allegedly without any bleeding Debrided keratoses 5 without any bleeding  Reappoint 3 months

## 2015-10-10 DIAGNOSIS — E785 Hyperlipidemia, unspecified: Secondary | ICD-10-CM | POA: Diagnosis not present

## 2015-10-10 DIAGNOSIS — E1100 Type 2 diabetes mellitus with hyperosmolarity without nonketotic hyperglycemic-hyperosmolar coma (NKHHC): Secondary | ICD-10-CM | POA: Diagnosis not present

## 2015-10-10 DIAGNOSIS — Z6822 Body mass index (BMI) 22.0-22.9, adult: Secondary | ICD-10-CM | POA: Diagnosis not present

## 2015-10-10 DIAGNOSIS — M130 Polyarthritis, unspecified: Secondary | ICD-10-CM | POA: Diagnosis not present

## 2016-01-01 ENCOUNTER — Encounter: Payer: Self-pay | Admitting: Podiatry

## 2016-01-01 ENCOUNTER — Ambulatory Visit (INDEPENDENT_AMBULATORY_CARE_PROVIDER_SITE_OTHER): Payer: Medicare Other | Admitting: Podiatry

## 2016-01-01 DIAGNOSIS — B351 Tinea unguium: Secondary | ICD-10-CM | POA: Diagnosis not present

## 2016-01-01 DIAGNOSIS — L84 Corns and callosities: Secondary | ICD-10-CM | POA: Diagnosis not present

## 2016-01-01 DIAGNOSIS — E119 Type 2 diabetes mellitus without complications: Secondary | ICD-10-CM | POA: Diagnosis not present

## 2016-01-01 DIAGNOSIS — M79676 Pain in unspecified toe(s): Secondary | ICD-10-CM | POA: Diagnosis not present

## 2016-01-01 NOTE — Progress Notes (Signed)
Patient ID: [name], female   DOB: [birth year], 82 y.o.   MRN: [id number]  Subjective: This patient presents again for schedule visit and is complaining that her toenails are cough walking wearing shoes and requests toenail debridement. Also, patient is complaining of uncomfortable keratoses on the right and left feet  Objective: Orientated 3 No open skin lesions bilaterally Corns on first and second toes bilaterally and fifth right toe and plantar second MPJ left  toenails are elongated, brittle, deformed, hypertrophic and tender direct palpation 6-10  Assessment: Symptomatic onychomycoses 6-10 Keratoses 4 that need debridement Type II diabetic  Plan: Debridement toenails 6-10 mechanically left without any bleeding Debrided keratoses 4 without any bleeding  Reappoint 3 months

## 2016-01-01 NOTE — Patient Instructions (Signed)
Diabetes and Foot Care Diabetes may cause you to have problems because of poor blood supply (circulation) to your feet and legs. This may cause the skin on your feet to become thinner, break easier, and heal more slowly. Your skin may become dry, and the skin may peel and crack. You may also have nerve damage in your legs and feet causing decreased feeling in them. You may not notice minor injuries to your feet that could lead to infections or more serious problems. Taking care of your feet is one of the most important things you can do for yourself.  HOME CARE INSTRUCTIONS  Wear shoes at all times, even in the house. Do not go barefoot. Bare feet are easily injured.  Check your feet daily for blisters, cuts, and redness. If you cannot see the bottom of your feet, use a mirror or ask someone for help.  Wash your feet with warm water (do not use hot water) and mild soap. Then pat your feet and the areas between your toes until they are completely dry. Do not soak your feet as this can dry your skin.  Apply a moisturizing lotion or petroleum jelly (that does not contain alcohol and is unscented) to the skin on your feet and to dry, brittle toenails. Do not apply lotion between your toes.  Trim your toenails straight across. Do not dig under them or around the cuticle. File the edges of your nails with an emery board or nail file.  Do not cut corns or calluses or try to remove them with medicine.  Wear clean socks or stockings every day. Make sure they are not too tight. Do not wear knee-high stockings since they may decrease blood flow to your legs.  Wear shoes that fit properly and have enough cushioning. To break in new shoes, wear them for just a few hours a day. This prevents you from injuring your feet. Always look in your shoes before you put them on to be sure there are no objects inside.  Do not cross your legs. This may decrease the blood flow to your feet.  If you find a minor scrape,  cut, or break in the skin on your feet, keep it and the skin around it clean and dry. These areas may be cleansed with mild soap and water. Do not cleanse the area with peroxide, alcohol, or iodine.  When you remove an adhesive bandage, be sure not to damage the skin around it.  If you have a wound, look at it several times a day to make sure it is healing.  Do not use heating pads or hot water bottles. They may burn your skin. If you have lost feeling in your feet or legs, you may not know it is happening until it is too late.  Make sure your health care provider performs a complete foot exam at least annually or more often if you have foot problems. Report any cuts, sores, or bruises to your health care provider immediately. SEEK MEDICAL CARE IF:   You have an injury that is not healing.  You have cuts or breaks in the skin.  You have an ingrown nail.  You notice redness on your legs or feet.  You feel burning or tingling in your legs or feet.  You have pain or cramps in your legs and feet.  Your legs or feet are numb.  Your feet always feel cold. SEEK IMMEDIATE MEDICAL CARE IF:   There is increasing redness,   swelling, or pain in or around a wound.  There is a red line that goes up your leg.  Pus is coming from a wound.  You develop a fever or as directed by your health care provider.  You notice a bad smell coming from an ulcer or wound.   This information is not intended to replace advice given to you by your health care provider. Make sure you discuss any questions you have with your health care provider.   Document Released: 11/13/2000 Document Revised: 07/19/2013 Document Reviewed: 04/25/2013 Elsevier Interactive Patient Education 2016 [employer]  

## 2016-03-12 ENCOUNTER — Encounter (HOSPITAL_COMMUNITY): Payer: Self-pay | Admitting: *Deleted

## 2016-03-12 ENCOUNTER — Ambulatory Visit (HOSPITAL_COMMUNITY)
Admission: EM | Admit: 2016-03-12 | Discharge: 2016-03-12 | Disposition: A | Discharge status: Home / Self Care | Payer: Medicare Other | Attending: Family Medicine | Admitting: Family Medicine

## 2016-03-12 DIAGNOSIS — S8002XA Contusion of left knee, initial encounter: Secondary | ICD-10-CM | POA: Diagnosis not present

## 2016-03-12 NOTE — ED Provider Notes (Signed)
CSN: [id number]     Arrival date & time 03/12/16  1803 History   First MD Initiated Contact with Patient 03/12/16 1901     Chief Complaint  Patient presents with  . Knee Injury   (Consider location/radiation/quality/duration/timing/severity/associated sxs/prior Treatment) Patient is a 82 y.o. female presenting with knee pain. The history is provided by the patient and a relative.  Knee Pain Location:  Knee Time since incident:  4 hours Injury: yes   Mechanism of injury: fall   Mechanism of injury comment:  In kitchen and getting up from floor fell onto left knee and it bent under her. Fall:    Impact surface:  Hard floor   Point of impact:  Knees   Entrapped after fall: no   Knee location:  L knee Pain details:    Radiates to:  Does not radiate Prior injury to area:  No Relieved by:  None tried Worsened by:  Nothing tried Ineffective treatments:  None tried Associated symptoms: stiffness   Associated symptoms: no decreased ROM and no fever     Past Medical History  Diagnosis Date  . Hypertension   . Diabetes mellitus without complication ([place])   . Dyslipidemia   . Constipation   . Anemia    Past Surgical History  Procedure Laterality Date  . Abdominal hysterectomy    . Multiple tooth extractions     History reviewed. No pertinent family history. Social History  Substance Use Topics  . Smoking status: Never Smoker   . Smokeless tobacco: Never Used  . Alcohol Use: No   OB History    No data available     Review of Systems  Constitutional: Negative.  Negative for fever.  Musculoskeletal: Positive for stiffness. Negative for joint swelling and gait problem.  Skin: Negative.   All other systems reviewed and are negative.   Allergies  Review of patient's allergies indicates no known allergies.  Home Medications   Prior to Admission medications   Medication Sig Start Date End Date Taking? Authorizing Provider  amLODipine (NORVASC) 10 MG tablet Take 10 mg  by mouth daily.    Historical Provider, MD  atorvastatin (LIPITOR) 10 MG tablet Take 10 mg by mouth daily.    Historical Provider, MD  Calcium Carbonate (CALCIUM 500 PO) Take by mouth.    Historical Provider, MD  carvedilol (COREG) 6.25 MG tablet Take 6.25 mg by mouth at bedtime.    Historical Provider, MD  Cholecalciferol (VITAMIN D-3 PO) Take 1,000 Units by mouth daily.     Historical Provider, MD  clopidogrel (PLAVIX) 75 MG tablet Take 1 tablet (75 mg total) by mouth daily with breakfast. 01/01/14   Barton Dubois, MD  glimepiride (AMARYL) 4 MG tablet Take 4 mg by mouth daily with breakfast.    Historical Provider, MD  ipratropium (ATROVENT) 0.06 % nasal spray Place 1 spray into both nostrils 4 (four) times daily. 07/13/14   Billy Fischer, MD  metFORMIN (GLUCOPHAGE) 1000 MG tablet Take 1,000 mg by mouth 2 (two) times daily with a meal.    Historical Provider, MD  pantoprazole (PROTONIX) 20 MG tablet Take 1 tablet (20 mg total) by mouth daily. 07/13/14   Billy Fischer, MD  polyethylene glycol (MIRALAX / [name]) packet Take 17 g by mouth daily.    Historical Provider, MD  valsartan-hydrochlorothiazide (DIOVAN-HCT) 320-25 MG per tablet Take 1 tablet by mouth daily.    Historical Provider, MD   Meds Ordered and Administered this Visit  Medications -  No data to display  BP 169/75 mmHg  Pulse 80  Temp(Src) 97.5 F (36.4 C) (Oral)  Resp 16  SpO2 99% No data found.   Physical Exam  Constitutional: She is oriented to person, place, and time. She appears well-developed and well-nourished. No distress.  Musculoskeletal: She exhibits tenderness.       Left knee: She exhibits bony tenderness. She exhibits normal range of motion, no swelling, no effusion, no deformity, normal alignment and normal patellar mobility. No medial joint line, no lateral joint line and no patellar tendon tenderness noted.  Neurological: She is alert and oriented to person, place, and time.  Skin: Skin is warm and dry. No  rash noted.  Nursing note and vitals reviewed.   ED Course  Procedures (including critical care time)  Labs Review Labs Reviewed - No data to display  Imaging Review No results found.   Visual Acuity Review  Right Eye Distance:   Left Eye Distance:   Bilateral Distance:    Right Eye Near:   Left Eye Near:    Bilateral Near:         MDM   1. Knee contusion, left, initial encounter        Billy Fischer, MD 03/12/16 [phone]

## 2016-03-12 NOTE — ED Notes (Signed)
Pt  Reports she  Felled  Today   And  Injured  Her l   Knee      She  Reports        She  Did      Not  Black  Out           She  Has  Pain        On  Weight  Bearing      She        Has  Some  Pain  And  Swelling  To  The  Area

## 2016-03-12 NOTE — Discharge Instructions (Signed)
Ice pack as needed, tylenol or advil for pain

## 2016-04-01 ENCOUNTER — Encounter: Payer: Self-pay | Admitting: Podiatry

## 2016-04-01 ENCOUNTER — Ambulatory Visit (INDEPENDENT_AMBULATORY_CARE_PROVIDER_SITE_OTHER): Payer: Medicare Other | Admitting: Podiatry

## 2016-04-01 DIAGNOSIS — B351 Tinea unguium: Secondary | ICD-10-CM

## 2016-04-01 DIAGNOSIS — E119 Type 2 diabetes mellitus without complications: Secondary | ICD-10-CM

## 2016-04-01 DIAGNOSIS — L84 Corns and callosities: Secondary | ICD-10-CM | POA: Diagnosis not present

## 2016-04-01 DIAGNOSIS — M79676 Pain in unspecified toe(s): Secondary | ICD-10-CM

## 2016-04-01 NOTE — Patient Instructions (Signed)
Diabetes and Foot Care Diabetes may cause you to have problems because of poor blood supply (circulation) to your feet and legs. This may cause the skin on your feet to become thinner, break easier, and heal more slowly. Your skin may become dry, and the skin may peel and crack. You may also have nerve damage in your legs and feet causing decreased feeling in them. You may not notice minor injuries to your feet that could lead to infections or more serious problems. Taking care of your feet is one of the most important things you can do for yourself.  HOME CARE INSTRUCTIONS  Wear shoes at all times, even in the house. Do not go barefoot. Bare feet are easily injured.  Check your feet daily for blisters, cuts, and redness. If you cannot see the bottom of your feet, use a mirror or ask someone for help.  Wash your feet with warm water (do not use hot water) and mild soap. Then pat your feet and the areas between your toes until they are completely dry. Do not soak your feet as this can dry your skin.  Apply a moisturizing lotion or petroleum jelly (that does not contain alcohol and is unscented) to the skin on your feet and to dry, brittle toenails. Do not apply lotion between your toes.  Trim your toenails straight across. Do not dig under them or around the cuticle. File the edges of your nails with an emery board or nail file.  Do not cut corns or calluses or try to remove them with medicine.  Wear clean socks or stockings every day. Make sure they are not too tight. Do not wear knee-high stockings since they may decrease blood flow to your legs.  Wear shoes that fit properly and have enough cushioning. To break in new shoes, wear them for just a few hours a day. This prevents you from injuring your feet. Always look in your shoes before you put them on to be sure there are no objects inside.  Do not cross your legs. This may decrease the blood flow to your feet.  If you find a minor scrape,  cut, or break in the skin on your feet, keep it and the skin around it clean and dry. These areas may be cleansed with mild soap and water. Do not cleanse the area with peroxide, alcohol, or iodine.  When you remove an adhesive bandage, be sure not to damage the skin around it.  If you have a wound, look at it several times a day to make sure it is healing.  Do not use heating pads or hot water bottles. They may burn your skin. If you have lost feeling in your feet or legs, you may not know it is happening until it is too late.  Make sure your health care provider performs a complete foot exam at least annually or more often if you have foot problems. Report any cuts, sores, or bruises to your health care provider immediately. SEEK MEDICAL CARE IF:   You have an injury that is not healing.  You have cuts or breaks in the skin.  You have an ingrown nail.  You notice redness on your legs or feet.  You feel burning or tingling in your legs or feet.  You have pain or cramps in your legs and feet.  Your legs or feet are numb.  Your feet always feel cold. SEEK IMMEDIATE MEDICAL CARE IF:   There is increasing redness,   swelling, or pain in or around a wound.  There is a red line that goes up your leg.  Pus is coming from a wound.  You develop a fever or as directed by your health care provider.  You notice a bad smell coming from an ulcer or wound.   This information is not intended to replace advice given to you by your health care provider. Make sure you discuss any questions you have with your health care provider.   Document Released: 11/13/2000 Document Revised: 07/19/2013 Document Reviewed: 04/25/2013 Elsevier Interactive Patient Education 2016 [employer]  

## 2016-04-02 NOTE — Progress Notes (Signed)
Patient ID: [name], female   DOB: 04-13-1926, 82 y.o.   MRN: [id number]   Subjective: This patient presents again for schedule visit and is complaining that her toenails are uncomfortable when  Walking and wearing shoes and requests toenail debridement. Also, patient is complaining of uncomfortable keratoses on the right and left feet  Objective: Orientated 3 No open skin lesions bilaterally Corns on first and second toes bilaterally and fifth right toe and plantar second MPJ left toenails are elongated, brittle, deformed, hypertrophic and tender direct palpation 6-10  Assessment: Symptomatic onychomycoses 6-10 Keratoses 4  Type II diabetic  Plan: Debridement toenails 6-10 mechanically left without any bleeding Debrided keratoses 4 without any bleeding  Reappoint 3 months

## 2016-07-08 ENCOUNTER — Ambulatory Visit: Payer: Medicare Other | Admitting: Podiatry

## 2016-07-24 ENCOUNTER — Encounter (HOSPITAL_COMMUNITY): Payer: Self-pay | Admitting: Emergency Medicine

## 2016-07-24 ENCOUNTER — Ambulatory Visit (HOSPITAL_COMMUNITY)
Admission: EM | Admit: 2016-07-24 | Discharge: 2016-07-24 | Disposition: A | Discharge status: Home / Self Care | Payer: Medicare Other | Attending: Family Medicine | Admitting: Family Medicine

## 2016-07-24 DIAGNOSIS — M542 Cervicalgia: Secondary | ICD-10-CM | POA: Diagnosis not present

## 2016-07-24 NOTE — ED Provider Notes (Signed)
CSN: [id number]     Arrival date & time 07/24/16  1452 History   First MD Initiated Contact with Patient 07/24/16 1609     Chief Complaint  Patient presents with  . Neck Pain   (Consider location/radiation/quality/duration/timing/severity/associated sxs/prior Treatment) HPI Patient reports that she has had an intermittent "crick in her neck" over the last 3 months.  She reports that she woke up this morning feeling a pain in her neck when she turns her neck to either side.  She is no longer having the pain.  She notes that the pain is aching in nature.  She reports no preceding injury.  She has been using an OTC topical analgesic, which helps.  She reports that she was told by her PCP to sleep with lots of pillows to help with acid reflux symptoms.  She is wondering how she can help the pain not come back.  She has never been to PT for pain.  Past Medical History:  Diagnosis Date  . Anemia   . Constipation   . Diabetes mellitus without complication ([place])   . Dyslipidemia   . Hypertension    Past Surgical History:  Procedure Laterality Date  . ABDOMINAL HYSTERECTOMY    . MULTIPLE TOOTH EXTRACTIONS     History reviewed. No pertinent family history. Social History  Substance Use Topics  . Smoking status: Never Smoker  . Smokeless tobacco: Never Used  . Alcohol use No   OB History    No data available     Review of Systems  Constitutional: Negative for chills and fever.  Musculoskeletal: Positive for arthralgias and neck pain. Negative for neck stiffness.  Neurological: Negative for weakness, numbness and headaches.    Allergies  Review of patient's allergies indicates no known allergies.  Home Medications   Prior to Admission medications   Medication Sig Start Date End Date Taking? Authorizing Provider  amLODipine (NORVASC) 10 MG tablet Take 10 mg by mouth daily.   Yes Historical Provider, MD  atorvastatin (LIPITOR) 10 MG tablet Take 10 mg by mouth daily.   Yes  Historical Provider, MD  Calcium Carbonate (CALCIUM 500 PO) Take by mouth.   Yes Historical Provider, MD  carvedilol (COREG) 6.25 MG tablet Take 6.25 mg by mouth at bedtime.   Yes Historical Provider, MD  Cholecalciferol (VITAMIN D-3 PO) Take 1,000 Units by mouth daily.    Yes Historical Provider, MD  clopidogrel (PLAVIX) 75 MG tablet Take 1 tablet (75 mg total) by mouth daily with breakfast. 01/01/14  Yes Barton Dubois, MD  glimepiride (AMARYL) 4 MG tablet Take 4 mg by mouth daily with breakfast.   Yes Historical Provider, MD  metFORMIN (GLUCOPHAGE) 1000 MG tablet Take 1,000 mg by mouth 2 (two) times daily with a meal.   Yes Historical Provider, MD  pantoprazole (PROTONIX) 20 MG tablet Take 1 tablet (20 mg total) by mouth daily. 07/13/14  Yes Billy Fischer, MD  valsartan-hydrochlorothiazide (DIOVAN-HCT) 320-25 MG per tablet Take 1 tablet by mouth daily.   Yes Historical Provider, MD  ipratropium (ATROVENT) 0.06 % nasal spray Place 1 spray into both nostrils 4 (four) times daily. 07/13/14   Billy Fischer, MD  polyethylene glycol (MIRALAX / [name]) packet Take 17 g by mouth daily.    Historical Provider, MD   Meds Ordered and Administered this Visit  Medications - No data to display  BP 160/60 (BP Location: Left Arm)   Pulse 64   Temp 98.6 F (37 C) (Oral)  Resp 16   SpO2 100%  No data found.  Physical Exam  Constitutional: She appears well-developed and well-nourished. No distress.  HENT:  Head: Normocephalic and atraumatic.  Eyes: Conjunctivae are normal.  Neck: Neck supple. No spinous process tenderness and no muscular tenderness present. No neck rigidity. Decreased range of motion present.  Decreased ROM in sidebending of c-spine.  Full painless ROM in flexion, extension and rotation of c-spine.  No midline TTP, no paraspinal TTP.  Slightly increased tonicity of the trapezius.  Skin: She is not diaphoretic.  Nursing note and vitals reviewed.   Urgent Care Course   Clinical  Course    Procedures (including critical care time)  Labs Review Labs Reviewed - No data to display  Imaging Review No results found.   MDM   1. Neck pain    [name] is a 83 y.o. female that presented to [facility] for resolved neck pain.  Her exam was remarkable for decreased AROM in side bending.  I suspect that laying on several pillows at night is contributing to intermittent pain.  She likely also has degenerative changes associated with arthritis in her neck given her age.  Neck stretches were reviewed with patient and a handout was provided with instruction on how to continue these at home.  I recommended that she consider talking to her PCP if home stretches do not help, could consider referral to PT.  Recommended continuing OTC topical analgesic, since this seems to be helping.  Patient discharged in stable condition home.    This patient was discussed with Stevan Born, NP, who agrees with my assessment and plan.  Duc Crocket M. Lajuana Ripple, DO PGY-3, [place], DO 07/24/16 1630

## 2016-07-24 NOTE — Discharge Instructions (Signed)
I recommend doing the physical therapy exercises I have provided.  If you do not feel improvement with these, consider talking to your doctor for a referral to physical therapy.  Continue using the topical cream for your pain if it is helping.

## 2016-07-24 NOTE — ED Triage Notes (Signed)
Pt c/o intermittent neck pain onset 3-4 months that will radiate to jaw on both sides  She denies inj/trauma.... Pain increases w/certain movements  Denies pain at the moment  Steady gait... A&O x4... NAD

## 2016-07-24 NOTE — ED Notes (Signed)
Pt d/c by provider.

## 2016-09-07 ENCOUNTER — Encounter (HOSPITAL_COMMUNITY): Payer: Self-pay | Admitting: Family Medicine

## 2016-09-07 ENCOUNTER — Ambulatory Visit (HOSPITAL_COMMUNITY)
Admission: EM | Admit: 2016-09-07 | Discharge: 2016-09-07 | Disposition: A | Discharge status: Home / Self Care | Payer: Medicare Other | Attending: Internal Medicine | Admitting: Internal Medicine

## 2016-09-07 DIAGNOSIS — Z7984 Long term (current) use of oral hypoglycemic drugs: Secondary | ICD-10-CM | POA: Insufficient documentation

## 2016-09-07 DIAGNOSIS — E785 Hyperlipidemia, unspecified: Secondary | ICD-10-CM | POA: Insufficient documentation

## 2016-09-07 DIAGNOSIS — I10 Essential (primary) hypertension: Secondary | ICD-10-CM | POA: Insufficient documentation

## 2016-09-07 DIAGNOSIS — N390 Urinary tract infection, site not specified: Secondary | ICD-10-CM | POA: Diagnosis not present

## 2016-09-07 DIAGNOSIS — Z79899 Other long term (current) drug therapy: Secondary | ICD-10-CM | POA: Diagnosis not present

## 2016-09-07 DIAGNOSIS — E1165 Type 2 diabetes mellitus with hyperglycemia: Secondary | ICD-10-CM | POA: Diagnosis present

## 2016-09-07 LAB — POCT URINALYSIS DIP (DEVICE)
BILIRUBIN URINE: NEGATIVE
Glucose, UA: NEGATIVE mg/dL
Ketones, ur: NEGATIVE mg/dL
Nitrite: POSITIVE — AB
PH: 6.5 (ref 5.0–8.0)
Protein, ur: 100 mg/dL — AB
Specific Gravity, Urine: 1.015 (ref 1.005–1.030)
Urobilinogen, UA: 1 mg/dL (ref 0.0–1.0)

## 2016-09-07 LAB — GLUCOSE, CAPILLARY: GLUCOSE-CAPILLARY: 66 mg/dL (ref 65–99)

## 2016-09-07 MED ORDER — CIPROFLOXACIN HCL 250 MG PO TABS: 250.0000 mg | ORAL_TABLET | Freq: Two times a day (BID) | ORAL | 0 refills | Status: DC

## 2016-09-07 NOTE — ED Triage Notes (Addendum)
Pt here and sts that she checked her blood sugar this am and it was elevated around 200. sts some weakness but "that may be my arthritis". sts also some burning when she pees

## 2016-09-07 NOTE — ED Notes (Signed)
Plan of  [place]

## 2016-09-09 LAB — URINE CULTURE: Culture: >=100000 — AB

## 2016-09-10 ENCOUNTER — Telehealth (HOSPITAL_COMMUNITY): Payer: Self-pay | Admitting: Emergency Medicine

## 2016-09-10 NOTE — ED Provider Notes (Signed)
CSN: [id number]     Arrival date & time 09/07/16  1230 History   First MD Initiated Contact with Patient 09/07/16 1448     Chief Complaint  Patient presents with  . Hyperglycemia  . Weakness   (Consider location/radiation/quality/duration/timing/severity/associated sxs/prior Treatment) HPI 82 y/o female with UTI sxs for the day. Elevated glucose this morning. Otherwise feels well except for burning with urination. No fever, vomiting at home.  Past Medical History:  Diagnosis Date  . Anemia   . Constipation   . Diabetes mellitus without complication ([place])   . Dyslipidemia   . Hypertension    Past Surgical History:  Procedure Laterality Date  . ABDOMINAL HYSTERECTOMY    . MULTIPLE TOOTH EXTRACTIONS     History reviewed. No pertinent family history. Social History  Substance Use Topics  . Smoking status: Never Smoker  . Smokeless tobacco: Never Used  . Alcohol use No   OB History    No data available     Review of Systems  Denies: HEADACHE, NAUSEA, ABDOMINAL PAIN, CHEST PAIN, CONGESTION, SHORTNESS OF BREATH  Allergies  Review of patient's allergies indicates no known allergies.  Home Medications   Prior to Admission medications   Medication Sig Start Date End Date Taking? Authorizing Provider  amLODipine (NORVASC) 10 MG tablet Take 10 mg by mouth daily.    Historical Provider, MD  atorvastatin (LIPITOR) 10 MG tablet Take 10 mg by mouth daily.    Historical Provider, MD  Calcium Carbonate (CALCIUM 500 PO) Take by mouth.    Historical Provider, MD  carvedilol (COREG) 6.25 MG tablet Take 6.25 mg by mouth at bedtime.    Historical Provider, MD  Cholecalciferol (VITAMIN D-3 PO) Take 1,000 Units by mouth daily.     Historical Provider, MD  ciprofloxacin (CIPRO) 250 MG tablet Take 1 tablet (250 mg total) by mouth every 12 (twelve) hours. 09/07/16   Konrad Felix, PA  clopidogrel (PLAVIX) 75 MG tablet Take 1 tablet (75 mg total) by mouth daily with breakfast. 01/01/14   Barton Dubois, MD  glimepiride (AMARYL) 4 MG tablet Take 4 mg by mouth daily with breakfast.    Historical Provider, MD  ipratropium (ATROVENT) 0.06 % nasal spray Place 1 spray into both nostrils 4 (four) times daily. 07/13/14   Billy Fischer, MD  metFORMIN (GLUCOPHAGE) 1000 MG tablet Take 1,000 mg by mouth 2 (two) times daily with a meal.    Historical Provider, MD  pantoprazole (PROTONIX) 20 MG tablet Take 1 tablet (20 mg total) by mouth daily. 07/13/14   Billy Fischer, MD  polyethylene glycol (MIRALAX / [name]) packet Take 17 g by mouth daily.    Historical Provider, MD  valsartan-hydrochlorothiazide (DIOVAN-HCT) 320-25 MG per tablet Take 1 tablet by mouth daily.    Historical Provider, MD   Meds Ordered and Administered this Visit  Medications - No data to display  BP 171/72   Pulse 78   Temp 97.8 F (36.6 C)   Resp 16   SpO2 100%  No data found.   Physical Exam NURSES NOTES AND VITAL SIGNS REVIEWED. CONSTITUTIONAL: Well developed, well nourished, no acute distress HEENT: normocephalic, atraumatic EYES: Conjunctiva normal NECK:normal ROM, supple, no adenopathy PULMONARY:No respiratory distress, normal effort ABDOMINAL: Soft, ND, NT BS+, No CVAT MUSCULOSKELETAL: Normal ROM of all extremities,  SKIN: warm and dry without rash PSYCHIATRIC: Mood and affect, behavior are normal  Urgent Care Course   Clinical Course    Procedures (including critical care time)  Labs Review Labs Reviewed  URINE CULTURE - Abnormal; Notable for the following:       Result Value   Culture >=100,000 COLONIES/mL KLEBSIELLA PNEUMONIAE (*)    Organism ID, Bacteria KLEBSIELLA PNEUMONIAE (*)    All other components within normal limits  POCT URINALYSIS DIP (DEVICE) - Abnormal; Notable for the following:    Hgb urine dipstick TRACE (*)    Protein, ur 100 (*)    Nitrite POSITIVE (*)    Leukocytes, UA TRACE (*)    All other components within normal limits  GLUCOSE, CAPILLARY    Imaging Review No  results found.   Visual Acuity Review  Right Eye Distance:   Left Eye Distance:   Bilateral Distance:    Right Eye Near:   Left Eye Near:    Bilateral Near:        Discussed with pt 's pcp, Dr. Criss Rosales she would like Cipro started and she will she pt in office in a couple of days.  MDM   1. Lower urinary tract infectious disease     Patient is reassured that there are no issues that require transfer to higher level of care at this time or additional tests. Patient is advised to continue home symptomatic treatment. Patient is advised that if there are new or worsening symptoms to attend the emergency department, contact primary care provider, or return to UC. Instructions of care provided discharged home in stable condition.    THIS NOTE WAS GENERATED USING A VOICE RECOGNITION SOFTWARE PROGRAM. ALL REASONABLE EFFORTS  WERE MADE TO PROOFREAD THIS DOCUMENT FOR ACCURACY.  I have verbally reviewed the discharge instructions with the patient. A printed AVS was given to the patient.  All questions were answered prior to discharge.      Konrad Felix, PA 09/10/16 1025

## 2016-09-10 NOTE — Telephone Encounter (Signed)
-----   Message from Sherlene Shams, MD sent at 09/09/2016  9:14 PM EDT ----- Please let patient know that urine culture was positive for Klebsiella sensitive to cipro rx given at [facility] visit 09/07/16.  Finish cipro.   Recheck or followup with PCP Lucianne Lei for further evaluation if symptoms persist.  LM

## 2016-09-10 NOTE — Telephone Encounter (Signed)
Called pt and notified of recent lab results Pt ID'd properly... Reports feeling better and sx have subsided and taking and tolerating well antibiotics  States she had a f/u appt w/PCP yest (10/11) Adv pt if sx are not getting better to return or to f/u w/PCP Pt verb understanding.

## 2016-12-22 ENCOUNTER — Ambulatory Visit (INDEPENDENT_AMBULATORY_CARE_PROVIDER_SITE_OTHER): Payer: Medicare Other | Admitting: Podiatry

## 2016-12-22 ENCOUNTER — Encounter: Payer: Self-pay | Admitting: Podiatry

## 2016-12-22 VITALS — BP 162/67 | HR 78 | Resp 14

## 2016-12-22 DIAGNOSIS — M79676 Pain in unspecified toe(s): Secondary | ICD-10-CM | POA: Diagnosis not present

## 2016-12-22 DIAGNOSIS — L84 Corns and callosities: Secondary | ICD-10-CM

## 2016-12-22 DIAGNOSIS — B351 Tinea unguium: Secondary | ICD-10-CM

## 2016-12-22 NOTE — Patient Instructions (Signed)

## 2016-12-22 NOTE — Progress Notes (Signed)
Patient ID: [name], female   DOB: 08/02/1926, 83 y.o.   MRN: [id number]    Subjective: This patient presents again for schedule visit and is complaining that her toenails are uncomfortable when  Walking and wearing shoes and requests toenail debridement. Also, patient is complaining of uncomfortable keratoses on the right and left feet  Objective: Orientated 3 Trace pedal pulses bilaterally Reflex immediate bilaterally Sensation to 10 g monofilament wire intact 1/5 right 2/5 left Vibratory sensation reactive bilaterally Ankle reflex reactive bilaterally No open skin lesions bilaterally Corns on first and second toes bilaterally and fifth right toe and plantar second MPJ left toenails are elongated, brittle, deformed, hypertrophic and tender direct palpation 6-10 HAV bilaterally Hammertoe fifth bilaterally Manual motor testing dorsi flexion, plantar flexion 5/5 bilaterally  Assessment: Symptomatic onychomycoses 6-10 Keratoses 4  Type II diabetic with decreased pedal pulses bilaterally   Plan: Debridement toenails 6-10 mechanically left without any bleeding Debrided keratoses 4 without any bleeding  Reappoint 3 months

## 2017-03-23 ENCOUNTER — Encounter: Payer: Self-pay | Admitting: Podiatry

## 2017-03-23 ENCOUNTER — Ambulatory Visit (INDEPENDENT_AMBULATORY_CARE_PROVIDER_SITE_OTHER): Payer: Medicare Other | Admitting: Podiatry

## 2017-03-23 DIAGNOSIS — R0989 Other specified symptoms and signs involving the circulatory and respiratory systems: Secondary | ICD-10-CM

## 2017-03-23 DIAGNOSIS — L84 Corns and callosities: Secondary | ICD-10-CM

## 2017-03-23 DIAGNOSIS — B351 Tinea unguium: Secondary | ICD-10-CM

## 2017-03-23 DIAGNOSIS — M79676 Pain in unspecified toe(s): Secondary | ICD-10-CM

## 2017-03-23 DIAGNOSIS — E1142 Type 2 diabetes mellitus with diabetic polyneuropathy: Secondary | ICD-10-CM | POA: Diagnosis not present

## 2017-03-23 NOTE — Patient Instructions (Signed)

## 2017-03-23 NOTE — Progress Notes (Signed)
Patient ID: [name], female   DOB: 1926/01/31, 83 y.o.   MRN: [id number]    Subjective: This patient presents again for schedule visit and is complaining that her toenails are uncomfortable when Walking and wearing shoes and requests toenail debridement. Also, patient is complaining of uncomfortable keratoses on the right and left feet  Objective: Orientated 3 Trace pedal pulses bilaterally Reflex immediate bilaterally Sensation to 10 g monofilament wire intact 1/5 right 2/5 left Vibratory sensation reactive bilaterally Ankle reflex reactive bilaterally No open skin lesions bilaterally Atrophic skin with absent hair growth bilaterally Corns on first and second toes bilaterally and fifth right toe and plantar second MPJ left toenails are elongated, brittle, deformed, hypertrophic and tender direct palpation 6-10 HAV bilaterally Hammertoe fifth bilaterally Manual motor testing dorsi flexion, plantar flexion 5/5 bilaterally  Assessment: Symptomatic onychomycoses 6-10 Keratoses 4  Type II diabetic with decreased pedal pulses bilaterally Diabetic peripheral neuropathy  Plan: Debridement toenails 6-10 mechanically left without any bleeding Debrided keratoses 4 without any bleeding  Reappoint 3 months

## 2017-05-18 ENCOUNTER — Ambulatory Visit (HOSPITAL_COMMUNITY)
Admission: EM | Admit: 2017-05-18 | Discharge: 2017-05-18 | Disposition: A | Discharge status: Home / Self Care | Payer: Medicare Other | Attending: Family Medicine | Admitting: Family Medicine

## 2017-05-18 ENCOUNTER — Encounter (HOSPITAL_COMMUNITY): Payer: Self-pay | Admitting: Family Medicine

## 2017-05-18 DIAGNOSIS — S80812A Abrasion, left lower leg, initial encounter: Secondary | ICD-10-CM | POA: Diagnosis not present

## 2017-05-18 HISTORY — DX: Unspecified osteoarthritis, unspecified site: M19.90

## 2017-05-18 MED ORDER — MUPIROCIN 2 % EX OINT: 1.0000 "application " | TOPICAL_OINTMENT | Freq: Two times a day (BID) | CUTANEOUS | 0 refills | Status: DC

## 2017-05-18 NOTE — ED Triage Notes (Signed)
Pt here for wound to LLE. sts that she scraped it last week on the bus. sts she has been cleaning it at home. Area red

## 2017-05-18 NOTE — ED Provider Notes (Signed)
CSN: [id number]     Arrival date & time 05/18/17  1443 History     Chief Complaint  Patient presents with  . Leg Pain    83 yo female comes in for 1 week history of abrasion on left lower extremity. She was boarding the bus 1 week ago, and scraped her the anterior of her left leg on the step as the step was at a high level. Patient denies weakness, dizziness, or leg giving out to lead to the incident. Patient has been taking care of the wound on her own for the past week with otc wound spray cleaner. She came in today because she wanted a wound check as she is a diabetic. Denies discharge or foul smelling of the wound. Denies increased warmth. Denies fever, chills malaise. Denies weakness, confusion. Has been compliant with her medications and states diabetes has been stable. Goes to a foot doctor for care of her feet, and states sensation has been intact.       Past Medical History:  Diagnosis Date  . Anemia   . Arthritis   . Constipation   . Diabetes mellitus without complication ([place])   . Dyslipidemia   . Hypertension    Past Surgical History:  Procedure Laterality Date  . ABDOMINAL HYSTERECTOMY    . MULTIPLE TOOTH EXTRACTIONS     History reviewed. No pertinent family history. Social History  Substance Use Topics  . Smoking status: Never Smoker  . Smokeless tobacco: Never Used  . Alcohol use No   OB History    No data available     Review of Systems  Constitutional: Negative for chills, fatigue and fever.  Respiratory: Negative for cough, chest tightness, shortness of breath and wheezing.   Cardiovascular: Negative for chest pain and palpitations.  Musculoskeletal: Negative for arthralgias, joint swelling and myalgias.  Skin: Positive for wound. Negative for rash.  Neurological: Negative for dizziness, weakness, light-headedness and numbness.  Psychiatric/Behavioral: Negative for confusion.    Allergies  Patient has no known allergies.  Home Medications   Prior  to Admission medications   Medication Sig Start Date End Date Taking? Authorizing Provider  amLODipine (NORVASC) 10 MG tablet Take 10 mg by mouth daily.    [provider]  atorvastatin (LIPITOR) 10 MG tablet Take 10 mg by mouth daily.    [provider]  Calcium Carbonate (CALCIUM 500 PO) Take by mouth.    [provider]  carvedilol (COREG) 6.25 MG tablet Take 6.25 mg by mouth at bedtime.    [provider]  Cholecalciferol (VITAMIN D-3 PO) Take 1,000 Units by mouth daily.     [provider]  ciprofloxacin (CIPRO) 250 MG tablet Take 1 tablet (250 mg total) by mouth every 12 (twelve) hours. 09/07/16   Konrad Felix, PA  clopidogrel (PLAVIX) 75 MG tablet Take 1 tablet (75 mg total) by mouth daily with breakfast. 01/01/14   Barton Dubois, MD  glimepiride (AMARYL) 4 MG tablet Take 4 mg by mouth daily with breakfast.    [provider]  ipratropium (ATROVENT) 0.06 % nasal spray Place 1 spray into both nostrils 4 (four) times daily. 07/13/14   Billy Fischer, MD  metFORMIN (GLUCOPHAGE) 1000 MG tablet Take 1,000 mg by mouth 2 (two) times daily with a meal.    [provider]  mupirocin ointment (BACTROBAN) 2 % Apply 1 application topically 2 (two) times daily. 05/18/17   Tasia Catchings, [place] V, PA-C  pantoprazole (PROTONIX) 20 MG tablet  Take 1 tablet (20 mg total) by mouth daily. 07/13/14   Billy Fischer, MD  polyethylene glycol (MIRALAX / [name]) packet Take 17 g by mouth daily.    [provider]  valsartan-hydrochlorothiazide (DIOVAN-HCT) 320-25 MG per tablet Take 1 tablet by mouth daily.    [provider]   Meds Ordered and Administered this Visit  Medications - No data to display  BP (!) 155/70   Pulse 99   Temp 98.4 F (36.9 C) (Oral)   Resp 18   SpO2 97%  No data found.   Physical Exam  Constitutional: She is oriented to person, place, and time. She appears well-developed and well-nourished. No distress.  HENT:   Head: Normocephalic and atraumatic.  Eyes: Conjunctivae are normal. Pupils are equal, round, and reactive to light.  Cardiovascular: Normal rate and regular rhythm.  Exam reveals no gallop and no friction rub.   No murmur heard. Pulmonary/Chest: Effort normal and breath sounds normal.  Neurological: She is alert and oriented to person, place, and time.  Skin: Skin is warm and dry. Abrasion noted. No ecchymosis noted. There is erythema.  Abrasion on left anterior shin. Scab grown with. Wound is clean without discharge. Slight erythema around wound, no increased warmth.   Psychiatric: She has a normal mood and affect. Her behavior is normal. Judgment normal.    Urgent Care Course     Procedures (including critical care time)  Labs Review Labs Reviewed - No data to display  Imaging Review No results found.       MDM   1. Abrasion of anterior left lower leg, initial encounter    1. Discussed with patient wound on left lower extremity is healing well without signs of infection. Goal is to keep wound clean, dry, and decrease chance of infection. Apply Bactroban ointment BID until wound heals. Wound cleaning instructions provided to the patient. Discussed with patient to avoid scratching, itching the wound and avoid soaking wound in water.  2. Patient to monitor for spreading of redness, increase warmth of the wound, or any foul smell from the wound. Monitor healing process as patient is a diabetic, and to continue to keep blood glucose controlled. Patient voiced understanding.     Ok Edwards, PA-C 05/18/17 1550

## 2017-05-28 ENCOUNTER — Encounter: Payer: Medicare Other | Attending: Surgery | Admitting: Surgery

## 2017-05-28 DIAGNOSIS — E11622 Type 2 diabetes mellitus with other skin ulcer: Secondary | ICD-10-CM | POA: Diagnosis not present

## 2017-05-28 DIAGNOSIS — Z79899 Other long term (current) drug therapy: Secondary | ICD-10-CM | POA: Diagnosis not present

## 2017-05-28 DIAGNOSIS — I10 Essential (primary) hypertension: Secondary | ICD-10-CM | POA: Insufficient documentation

## 2017-05-28 DIAGNOSIS — D649 Anemia, unspecified: Secondary | ICD-10-CM | POA: Insufficient documentation

## 2017-05-28 DIAGNOSIS — M1990 Unspecified osteoarthritis, unspecified site: Secondary | ICD-10-CM | POA: Diagnosis not present

## 2017-05-28 DIAGNOSIS — Z7984 Long term (current) use of oral hypoglycemic drugs: Secondary | ICD-10-CM | POA: Insufficient documentation

## 2017-05-28 DIAGNOSIS — L97222 Non-pressure chronic ulcer of left calf with fat layer exposed: Secondary | ICD-10-CM | POA: Diagnosis not present

## 2017-05-30 NOTE — Progress Notes (Signed)
[name], [name] ([id number]) Visit Report for 05/28/2017 Chief Complaint Document Details Patient Name: [name], [name] Date of Service: 05/28/2017 8:00 AM Medical Record Number: [id number] Patient Account Number: [id number] Date of Birth/Sex: [birth year] (83 y.o. Female) Treating RN: Montey Hora Primary Care Provider: Lucianne Lei Other Clinician: Referring Provider: Robyn Haber Treating Provider/Extender: Frann Rider in Treatment: 0 Information Obtained from: Patient Chief Complaint Patients presents for treatment of an open diabetic ulcer 2 the left lower anterior leg which she sustained an injury by blunt trauma approximately 3 weeks ago Electronic Signature(s) Signed: 05/28/2017 9:01:28 AM By: Christin Fudge MD, FACS Entered By: Christin Fudge on 05/28/2017 09:01:28 [place], YHCWCBJS P. ([id number]) -------------------------------------------------------------------------------- Debridement Details Patient Name: Alexa Lowes P. Date of Service: 05/28/2017 8:00 AM Medical Record Number: [id number] Patient Account Number: [id number] Date of Birth/Sex: 07-08-1926 (83 y.o. Female) Treating RN: Montey Hora Primary Care Provider: Lucianne Lei Other Clinician: Referring Provider: Robyn Haber Treating Provider/Extender: Frann Rider in Treatment: 0 Debridement Performed for Wound #1 Left,Anterior Lower Leg Assessment: Performed By: Physician Christin Fudge, MD Debridement: Debridement Pre-procedure Verification/Time Out Yes - 08:48 Taken: Start Time: 08:48 Pain Control: Lidocaine 4% Topical Solution Level: Skin/Subcutaneous Tissue Total Area Debrided (L x 6.8 (cm) x 0.9 (cm) = 6.12 (cm) W): Tissue and other Viable, Non-Viable, Eschar, Fibrin/Slough, Subcutaneous material debrided: Instrument: Forceps Bleeding: Minimum Hemostasis Achieved: Pressure End Time: 08:51 Procedural Pain: 0 Post Procedural Pain: 0 Response to Treatment:  Procedure was tolerated well Post Debridement Measurements of Total Wound Length: (cm) 6.8 Width: (cm) 0.9 Depth: (cm) 0.2 Volume: (cm) 0.961 Character of Wound/Ulcer Post Improved Debridement: Post Procedure Diagnosis Same as Pre-procedure Electronic Signature(s) Signed: 05/28/2017 9:01:03 AM By: Christin Fudge MD, FACS Signed: 05/28/2017 4:59:56 PM By: Montey Hora Entered By: Christin Fudge on 05/28/2017 09:01:03 [name], [name] ([id number]) -------------------------------------------------------------------------------- HPI Details Patient Name: Alexa Lowes P. Date of Service: 05/28/2017 8:00 AM Medical Record Number: [id number] Patient Account Number: [id number] Date of Birth/Sex: 12/18/1925 (83 y.o. Female) Treating RN: Montey Hora Primary Care Provider: Lucianne Lei Other Clinician: Referring Provider: Robyn Haber Treating Provider/Extender: Frann Rider in Treatment: 0 History of Present Illness Location: left anterior shin Quality: Patient reports experiencing a dull pain to affected area(s). Severity: Patient states wound are getting worse. Duration: Patient has had the wound for < 3 weeks prior to presenting for treatment Timing: Pain in wound is Intermittent (comes and goes Context: The wound occurred when the patient blunt trauma against her left shin Modifying Factors: Other treatment(s) tried include:active Bactroban ointment locally Associated Signs and Symptoms: Patient reports having: occasional discharge from the wound HPI Description: 83 year old patient was seen in the ED about 10 days ago for a history of abrasion to the left lower extremity while she was boarding a bus and scraped the anterior part of her left leg. She has been a diabetic for many years and has been taking treatment regularly. Her past medical history is also significant for anemia arthritis constipation and hypertension and is status post abdominal  hysterectomy. She has never been a smoker. after her ER visit she was advised to apply Bactroban ointment to the wound twice a day and continue to monitor her blood glucose levels. her last hemoglobin A1c was done 3 years ago and was 6.6% Engineer, maintenance) Signed: 05/28/2017 9:02:33 AM By: Christin Fudge MD, FACS Previous Signature: 05/28/2017 8:20:15 AM Version By: Christin Fudge MD, FACS Previous Signature: 05/28/2017 8:20:03 AM Version By: Christin Fudge MD, FACS Entered By: Christin Fudge on  05/28/2017 09:02:33 [name], [name] ([id number]) -------------------------------------------------------------------------------- Physical Exam Details Patient Name: [name], [name] Date of Service: 05/28/2017 8:00 AM Medical Record Number: [id number] Patient Account Number: [id number] Date of Birth/Sex: 1926/06/16 (83 y.o. Female) Treating RN: Montey Hora Primary Care Provider: Lucianne Lei Other Clinician: Referring Provider: Robyn Haber Treating Provider/Extender: Frann Rider in Treatment: 0 Constitutional . Pulse regular. Respirations normal and unlabored. Afebrile. . Eyes Nonicteric. Reactive to light. Ears, Nose, Mouth, and Throat Lips, teeth, and gums WNL.Marland Kitchen Moist mucosa without lesions. Neck supple and nontender. No palpable supraclavicular or cervical adenopathy. Normal sized without goiter. Respiratory WNL. No retractions.. Breath sounds WNL, No rubs, rales, rhonchi, or wheeze.. Cardiovascular Pedal Pulses WNL. ABIs were noncompressible. No clubbing, cyanosis or edema.. Chest Breasts symmetical and no nipple discharge.. Breast tissue WNL, no masses, lumps, or tenderness.. Gastrointestinal (GI) Abdomen without masses or tenderness.. No liver or spleen enlargement or tenderness.. Lymphatic No adneopathy. No adenopathy. No adenopathy. Musculoskeletal Adexa without tenderness or enlargement.. Digits and nails w/o clubbing, cyanosis, infection,  petechiae, ischemia, or inflammatory conditions.. Integumentary (Hair, Skin) No suspicious lesions. No crepitus or fluctuance. No peri-wound warmth or erythema. No masses.Marland Kitchen Psychiatric Judgement and insight Intact.. No evidence of depression, anxiety, or agitation.. Notes the patient has no lymphedema and on her left anterior shin along the bony margin she has multiple ulcerations with thickened eschar which are sharply removed with a toothed forcep and removed most of the subcutaneous debris. Minimal bleeding controlled with pressure Electronic Signature(s) Signed: 05/28/2017 9:03:28 AM By: Christin Fudge MD, FACS Entered By: Christin Fudge on 05/28/2017 09:03:28 [name] ([id number]) [place], [name] ([id number]) -------------------------------------------------------------------------------- Physician Orders Details Patient Name: Alexa Lowes P. Date of Service: 05/28/2017 8:00 AM Medical Record Number: [id number] Patient Account Number: [id number] Date of Birth/Sex: 04/15/1926 (83 y.o. Female) Treating RN: Montey Hora Primary Care Provider: Lucianne Lei Other Clinician: Referring Provider: Robyn Haber Treating Provider/Extender: Frann Rider in Treatment: 0 Verbal / Phone Orders: No Diagnosis Coding Wound Cleansing Wound #1 Left,Anterior Lower Leg o Clean wound with Normal Saline. o May Shower, gently pat wound dry prior to applying new dressing. Anesthetic Wound #1 Left,Anterior Lower Leg o Topical Lidocaine 4% cream applied to wound bed prior to debridement Primary Wound Dressing Wound #1 Left,Anterior Lower Leg o Medihoney gel Secondary Dressing Wound #1 Left,Anterior Lower Leg o Gauze and Kerlix/Conform - secure with netting Dressing Change Frequency Wound #1 Left,Anterior Lower Leg o Change dressing every day. Follow-up Appointments Wound #1 Left,Anterior Lower Leg o Return Appointment in 1 week. Edema Control Wound #1  Left,Anterior Lower Leg o Elevate legs to the level of the heart and pump ankles as often as possible Additional Orders / Instructions Wound #1 Left,Anterior Lower Leg o Increase protein intake. o Other: - Please add vitamin A, vitamin C and zinc supplements to your diet CHARNA, NEEB ([id number]) Electronic Signature(s) Signed: 05/28/2017 4:14:17 PM By: Christin Fudge MD, FACS Signed: 05/28/2017 4:59:56 PM By: Montey Hora Entered By: Montey Hora on 05/28/2017 08:53:02 [place], [name] ([id number]) -------------------------------------------------------------------------------- Problem List Details Patient Name: Alexa Lowes P. Date of Service: 05/28/2017 8:00 AM Medical Record Number: [id number] Patient Account Number: [id number] Date of Birth/Sex: [birth year] (83 y.o. Female) Treating RN: Montey Hora Primary Care Provider: Lucianne Lei Other Clinician: Referring Provider: Robyn Haber Treating Provider/Extender: Frann Rider in Treatment: 0 Active Problems ICD-10 Encounter Code Description Active Date Diagnosis E11.622 Type 2 diabetes mellitus with other skin ulcer 05/28/2017 Yes L97.222 Non-pressure chronic ulcer of left calf  with fat layer 05/28/2017 Yes exposed Inactive Problems Resolved Problems Electronic Signature(s) Signed: 05/28/2017 9:00:48 AM By: Christin Fudge MD, FACS Entered By: Christin Fudge on 05/28/2017 09:00:48 [name] ([id number]) -------------------------------------------------------------------------------- Progress Note Details Patient Name: Alexa Lowes P. Date of Service: 05/28/2017 8:00 AM Medical Record Number: [id number] Patient Account Number: [id number] Date of Birth/Sex: 1926-02-28 (83 y.o. Female) Treating RN: Montey Hora Primary Care Provider: Lucianne Lei Other Clinician: Referring Provider: Robyn Haber Treating Provider/Extender: Frann Rider in Treatment: 0 Subjective Chief  Complaint Information obtained from Patient Patients presents for treatment of an open diabetic ulcer 2 the left lower anterior leg which she sustained an injury by blunt trauma approximately 3 weeks ago History of Present Illness (HPI) The following HPI elements were documented for the patient's wound: Location: left anterior shin Quality: Patient reports experiencing a dull pain to affected area(s). Severity: Patient states wound are getting worse. Duration: Patient has had the wound for < 3 weeks prior to presenting for treatment Timing: Pain in wound is Intermittent (comes and goes Context: The wound occurred when the patient blunt trauma against her left shin Modifying Factors: Other treatment(s) tried include:active Bactroban ointment locally Associated Signs and Symptoms: Patient reports having: occasional discharge from the wound 83 year old patient was seen in the ED about 10 days ago for a history of abrasion to the left lower extremity while she was boarding a bus and scraped the anterior part of her left leg. She has been a diabetic for many years and has been taking treatment regularly. Her past medical history is also significant for anemia arthritis constipation and hypertension and is status post abdominal hysterectomy. She has never been a smoker. after her ER visit she was advised to apply Bactroban ointment to the wound twice a day and continue to monitor her blood glucose levels. her last hemoglobin A1c was done 3 years ago and was 6.6% Wound History Patient presents with 1 open wound that has been present for approximately 3 weeks. Patient has been treating wound in the following manner: open to air. Laboratory tests have not been performed in the last month. Patient reportedly has not tested positive for an antibiotic resistant organism. Patient reportedly has not tested positive for osteomyelitis. Patient reportedly has not had testing performed to evaluate  circulation in the legs. Patient History Information obtained from Patient. Allergies No Known Drug Allergies [name], [name] ([id number]) Social History Never smoker, Marital Status - Separated, Alcohol Use - Never, Drug Use - No History, Caffeine Use - Rarely. Medical History Hematologic/Lymphatic Patient has history of Anemia Cardiovascular Patient has history of Hypertension Endocrine Patient has history of Type II Diabetes Musculoskeletal Patient has history of Osteoarthritis Oncologic Denies history of Received Chemotherapy, Received Radiation Patient is treated with Oral Agents. Review of Systems (ROS) Constitutional Symptoms (General Health) The patient has no complaints or symptoms. Eyes The patient has no complaints or symptoms. Ear/Nose/Mouth/Throat The patient has no complaints or symptoms. Hematologic/Lymphatic The patient has no complaints or symptoms. Respiratory The patient has no complaints or symptoms. Cardiovascular The patient has no complaints or symptoms. Gastrointestinal The patient has no complaints or symptoms. Genitourinary The patient has no complaints or symptoms. Immunological The patient has no complaints or symptoms. Integumentary (Skin) The patient has no complaints or symptoms. Neurologic The patient has no complaints or symptoms. Oncologic The patient has no complaints or symptoms. Psychiatric The patient has no complaints or symptoms. [name], [name] ([id number]) Medications carvedilol 6.25 mg tablet oral 1 1 tablet  oral once daily at bedtime valsartan 320 mg-hydrochlorothiazide 25 mg tablet oral 1 1 tablet oral once daily Atrovent HFA 17 mcg/actuation aerosol inhaler inhalation 1 spray HFA aerosol inhaler inhalation 4 times daily metformin 1,000 mg tablet oral tablet oral twice daily glimepiride 4 mg tablet oral 1 1 tablet oral daily with breakfast atorvastatin 10 mg tablet oral 1 1 tablet oral once daily amlodipine  10 mg tablet oral 1 1 tablet oral once daily Calcium 500 500 mg calcium (1,250 mg) tablet oral tablet oral once daily Miralax 17 gram oral powder packet oral powder in packet oral daily clopidogrel 75 mg tablet oral 1 1 tablet oral once daily with breakfast pantoprazole 20 mg tablet,delayed release oral 1 1 tablet,delayed release (DR/EC) oral once daily ciprofloxacin 250 mg/5 mL oral suspension oral 1 1 suspension,microcapsule recon oral every 12 hours mupirocin 2 % topical ointment topical 1 1 ointment topical application twice daily Vitamin D3 1,000 unit capsule oral capsule oral daily Objective Constitutional Pulse regular. Respirations normal and unlabored. Afebrile. Vitals Time Taken: 8:11 AM, Height: 65 in, Source: Measured, Weight: 131 lbs, Source: Measured, BMI: 21.8, Temperature: 98.2 F, Pulse: 72 bpm, Respiratory Rate: 16 breaths/min, Blood Pressure: 174/62 mmHg. Eyes Nonicteric. Reactive to light. Ears, Nose, Mouth, and Throat Lips, teeth, and gums WNL.Marland Kitchen Moist mucosa without lesions. Neck supple and nontender. No palpable supraclavicular or cervical adenopathy. Normal sized without goiter. Respiratory WNL. No retractions.. Breath sounds WNL, No rubs, rales, rhonchi, or wheeze.. Cardiovascular Pedal Pulses WNL. ABIs were noncompressible. No clubbing, cyanosis or edema.. Chest Breasts symmetical and no nipple discharge.. Breast tissue WNL, no masses, lumps, or tenderness.Kerri Perches, [name] ([id number]) Gastrointestinal (GI) Abdomen without masses or tenderness.. No liver or spleen enlargement or tenderness.. Lymphatic No adneopathy. No adenopathy. No adenopathy. Musculoskeletal Adexa without tenderness or enlargement.. Digits and nails w/o clubbing, cyanosis, infection, petechiae, ischemia, or inflammatory conditions.Marland Kitchen Psychiatric Judgement and insight Intact.. No evidence of depression, anxiety, or agitation.. General Notes: the patient has no lymphedema and on her  left anterior shin along the bony margin she has multiple ulcerations with thickened eschar which are sharply removed with a toothed forcep and removed most of the subcutaneous debris. Minimal bleeding controlled with pressure Integumentary (Hair, Skin) No suspicious lesions. No crepitus or fluctuance. No peri-wound warmth or erythema. No masses.. Wound #1 status is Open. Original cause of wound was Trauma. The wound is located on the Left,Anterior Lower Leg. The wound measures 6.8cm length x 0.9cm width x 0.2cm depth; 4.807cm^2 area and 0.961cm^3 volume. There is no tunneling or undermining noted. There is a large amount of serous drainage noted. The wound margin is flat and intact. There is no granulation within the wound bed. There is a large (67-100%) amount of necrotic tissue within the wound bed including Eschar and Adherent Slough. The periwound skin appearance exhibited: Erythema. The periwound skin appearance did not exhibit: Callus, Crepitus, Excoriation, Induration, Rash, Scarring, Dry/Scaly, Maceration, Atrophie Blanche, Cyanosis, Ecchymosis, Hemosiderin Staining, Mottled, Pallor, Rubor. The surrounding wound skin color is noted with erythema which is circumferential. Periwound temperature was noted as No Abnormality. The periwound has tenderness on palpation. Assessment Active Problems ICD-10 E11.622 - Type 2 diabetes mellitus with other skin ulcer L97.222 - Non-pressure chronic ulcer of left calf with fat layer exposed [name], RESTER. ([id number]) this 83 year old female with diabetes mellitus had a blunt injury to her left anterior shin which has open ulcerated areas and after review today I have recommended: 1. Daily washing with soap and  water and application of medihoney locally with a light Kerlix dressing. 2. good control of her diabetes mellitus 3. Adequate protein, vitamin A, vitamin C and zinc 4. Regular visits the wound center Procedures Wound #1 Pre-procedure  diagnosis of Wound #1 is a Trauma, Other located on the Left,Anterior Lower Leg . There was a Skin/Subcutaneous Tissue Debridement ([id number]) debridement with total area of 6.12 sq cm performed by Christin Fudge, MD. with the following instrument(s): Forceps to remove Viable and Non-Viable tissue/material including Fibrin/Slough, Eschar, and Subcutaneous after achieving pain control using Lidocaine 4% Topical Solution. A time out was conducted at 08:48, prior to the start of the procedure. A Minimum amount of bleeding was controlled with Pressure. The procedure was tolerated well with a pain level of 0 throughout and a pain level of 0 following the procedure. Post Debridement Measurements: 6.8cm length x 0.9cm width x 0.2cm depth; 0.961cm^3 volume. Character of Wound/Ulcer Post Debridement is improved. Post procedure Diagnosis Wound #1: Same as Pre-Procedure Plan Wound Cleansing: Wound #1 Left,Anterior Lower Leg: Clean wound with Normal Saline. May Shower, gently pat wound dry prior to applying new dressing. Anesthetic: Wound #1 Left,Anterior Lower Leg: Topical Lidocaine 4% cream applied to wound bed prior to debridement Primary Wound Dressing: Wound #1 Left,Anterior Lower Leg: Medihoney gel Secondary Dressing: Wound #1 Left,Anterior Lower Leg: Gauze and Kerlix/Conform - secure with netting Dressing Change Frequency: Wound #1 Left,Anterior Lower Leg: Change dressing every day. Follow-up Appointments: Wound #1 Left,Anterior Lower Leg: Return Appointment in 1 week. Edema Control: Wound #1 Left,Anterior Lower Leg: [name], [name] ([id number]) Elevate legs to the level of the heart and pump ankles as often as possible Additional Orders / Instructions: Wound #1 Left,Anterior Lower Leg: Increase protein intake. Other: - Please add vitamin A, vitamin C and zinc supplements to your diet this 83 year old female with diabetes mellitus had a blunt injury to her left anterior shin  which has open ulcerated areas and after review today I have recommended: 1. Daily washing with soap and water and application of medihoney locally with a light Kerlix dressing. 2. good control of her diabetes mellitus 3. Adequate protein, vitamin A, vitamin C and zinc 4. Regular visits the wound center Electronic Signature(s) Signed: 05/28/2017 9:05:06 AM By: Christin Fudge MD, FACS Entered By: Christin Fudge on 05/28/2017 09:05:06 [name] ([id number]) -------------------------------------------------------------------------------- ROS/PFSH Details Patient Name: Alexa Lowes P. Date of Service: 05/28/2017 8:00 AM Medical Record Number: [id number] Patient Account Number: [id number] Date of Birth/Sex: 03-Oct-1926 (83 y.o. Female) Treating RN: Montey Hora Primary Care Provider: Lucianne Lei Other Clinician: Referring Provider: Robyn Haber Treating Provider/Extender: Frann Rider in Treatment: 0 Information Obtained From Patient Wound History Do you currently have one or more open woundso Yes How many open wounds do you currently haveo 1 Approximately how long have you had your woundso 3 weeks How have you been treating your wound(s) until nowo open to air Has your wound(s) ever healed and then re-openedo No Have you had any lab work done in the past montho No Have you tested positive for an antibiotic resistant organism (MRSA, VRE)o No Have you tested positive for osteomyelitis (bone infection)o No Have you had any tests for circulation on your legso No Constitutional Symptoms (General Health) Complaints and Symptoms: No Complaints or Symptoms Eyes Complaints and Symptoms: No Complaints or Symptoms Ear/Nose/Mouth/Throat Complaints and Symptoms: No Complaints or Symptoms Hematologic/Lymphatic Complaints and Symptoms: No Complaints or Symptoms Medical History: Positive for: Anemia Respiratory Complaints and Symptoms: No Complaints or  Symptoms Cardiovascular [name], WARRICK. ([id number]) Complaints and Symptoms: No Complaints or Symptoms Medical History: Positive for: Hypertension Gastrointestinal Complaints and Symptoms: No Complaints or Symptoms Endocrine Medical History: Positive for: Type II Diabetes Treated with: Oral agents Genitourinary Complaints and Symptoms: No Complaints or Symptoms Immunological Complaints and Symptoms: No Complaints or Symptoms Integumentary (Skin) Complaints and Symptoms: No Complaints or Symptoms Musculoskeletal Medical History: Positive for: Osteoarthritis Neurologic Complaints and Symptoms: No Complaints or Symptoms Oncologic Complaints and Symptoms: No Complaints or Symptoms Medical History: Negative for: Received Chemotherapy; Received Radiation Psychiatric [name], [name] ([id number]) Complaints and Symptoms: No Complaints or Symptoms Immunizations Pneumococcal Vaccine: Received Pneumococcal Vaccination: Yes Immunization Notes: up to date Family and Social History Never smoker; Marital Status - Separated; Alcohol Use: Never; Drug Use: No History; Caffeine Use: Rarely; Financial Concerns: No; Food, Clothing or Shelter Needs: No; Support System Lacking: No; Transportation Concerns: No; Advanced Directives: No; Patient does not want information on Advanced Directives Physician Affirmation I have reviewed and agree with the above information. Electronic Signature(s) Signed: 05/28/2017 4:14:17 PM By: Christin Fudge MD, FACS Signed: 05/28/2017 4:59:56 PM By: Montey Hora Entered By: Christin Fudge on 05/28/2017 09:00:19 [place], [name] ([id number]) -------------------------------------------------------------------------------- SuperBill Details Patient Name: Alexa Lowes P. Date of Service: 05/28/2017 Medical Record Number: [id number] Patient Account Number: [id number] Date of Birth/Sex: [birth year] (82 y.o. Female) Treating RN: Montey Hora Primary Care Provider: Lucianne Lei Other Clinician: Referring Provider: Robyn Haber Treating Provider/Extender: Frann Rider in Treatment: 0 Diagnosis Coding ICD-10 Codes Code Description E11.622 Type 2 diabetes mellitus with other skin ulcer L97.222 Non-pressure chronic ulcer of left calf with fat layer exposed Facility Procedures CPT4 Code: [id number] Description: [place] VISIT-LEV 3 EST PT Modifier: Quantity: 1 CPT4 Code: [id number] Description: 60630 - DEB SUBQ TISSUE 20 SQ CM/< ICD-10 Description Diagnosis E11.622 Type 2 diabetes mellitus with other skin ulcer L97.222 Non-pressure chronic ulcer of left calf with fat l Modifier: ayer exposed Quantity: 1 Physician Procedures CPT4 Code: 1601093 Description: 23557 - WC PHYS LEVEL 4 - NEW PT ICD-10 Description Diagnosis E11.622 Type 2 diabetes mellitus with other skin ulcer L97.222 Non-pressure chronic ulcer of left calf with fat l Modifier: 25 ayer exposed Quantity: 1 CPT4 Code: 3220254 Description: 27062 - WC PHYS SUBQ TISS 20 SQ CM ICD-10 Description Diagnosis E11.622 Type 2 diabetes mellitus with other skin ulcer L97.222 Non-pressure chronic ulcer of left calf with fat l Modifier: ayer exposed Quantity: 1 Electronic Signature(s) Signed: 05/28/2017 10:07:07 AM By: Montey Hora Signed: 05/28/2017 4:14:17 PM By: Christin Fudge MD, FACS Previous Signature: 05/28/2017 9:05:32 AM Version By: Christin Fudge MD, FACS Kerri Perches, [name] ([id number]) Entered By: Montey Hora on 05/28/2017 10:07:06

## 2017-05-30 NOTE — Progress Notes (Addendum)
RHYLAN, GROSS ([id number]) Visit Report for 05/28/2017 Allergy List Details Patient Name: MITALI, SHENEFIELD P. Date of Service: 05/28/2017 8:00 AM Medical Record Number: [id number] Patient Account Number: [id number] Date of Birth/Sex: [birth year] (83 y.o. Female) Treating RN: Montey Hora Primary Care Laurine Kuyper: Lucianne Lei Other Clinician: Referring Jeet Shough: Lucianne Lei Treating Ogden Handlin/Extender: Frann Rider in Treatment: 0 Allergies Active Allergies No Known Drug Allergies Allergy Notes Electronic Signature(s) Signed: 05/28/2017 4:59:56 PM By: Montey Hora Entered By: Montey Hora on 05/28/2017 08:12:44 Zee, [name] ([id number]) -------------------------------------------------------------------------------- Arrival Information Details Patient Name: [name] Date of Service: 05/28/2017 8:00 AM Medical Record Number: [id number] Patient Account Number: [id number] Date of Birth/Sex: 1926/10/04 (83 y.o. Female) Treating RN: Montey Hora Primary Care Katy Brickell: Lucianne Lei Other Clinician: Referring [name] Brubacher: Lucianne Lei Treating Trino Higinbotham/Extender: Frann Rider in Treatment: 0 Visit Information Patient Arrived: Ambulatory Arrival Time: 08:10 Accompanied By: self Transfer Assistance: None Patient Identification Verified: Yes Secondary Verification Process Yes Completed: Patient Has Alerts: Yes Patient Alerts: DMII ABI [place] BILATERAL >220 Electronic Signature(s) Signed: 05/28/2017 4:59:56 PM By: Montey Hora Entered By: Montey Hora on 05/28/2017 08:32:45 Lembo, JTTSVXBL T. ([id number]) -------------------------------------------------------------------------------- Clinic Level of Care Assessment Details Patient Name: [name] Date of Service: 05/28/2017 8:00 AM Medical Record Number: [id number] Patient Account Number: [id number] Date of Birth/Sex: 1926/03/19 (83 y.o. Female) Treating RN: Montey Hora Primary  Care Wael Maestas: Lucianne Lei Other Clinician: Referring Deverick Pruss: Lucianne Lei Treating Deandrew Hoecker/Extender: Frann Rider in Treatment: 0 Clinic Level of Care Assessment Items TOOL 1 Quantity Score []  - Use when EandM and Procedure is performed on INITIAL visit 0 ASSESSMENTS - Nursing Assessment / Reassessment X - General Physical Exam (combine w/ comprehensive assessment (listed just 1 20 below) when performed on new pt. evals) X - Comprehensive Assessment (HX, ROS, Risk Assessments, Wounds Hx, etc.) 1 25 ASSESSMENTS - Wound and Skin Assessment / Reassessment []  - Dermatologic / Skin Assessment (not related to wound area) 0 ASSESSMENTS - Ostomy and/or Continence Assessment and Care []  - Incontinence Assessment and Management 0 []  - Ostomy Care Assessment and Management (repouching, etc.) 0 PROCESS - Coordination of Care X - Simple Patient / Family Education for ongoing care 1 15 []  - Complex (extensive) Patient / Family Education for ongoing care 0 []  - Staff obtains Programmer, systems, Records, Test Results / Process Orders 0 []  - Staff telephones HHA, Nursing Homes / Clarify orders / etc 0 []  - Routine Transfer to another Facility (non-emergent condition) 0 []  - Routine Hospital Admission (non-emergent condition) 0 X - New Admissions / Biomedical engineer / Ordering NPWT, Apligraf, etc. 1 15 []  - Emergency Hospital Admission (emergent condition) 0 PROCESS - Special Needs []  - Pediatric / Minor Patient Management 0 []  - Isolation Patient Management 0 Cockerham, Brookley P. ([id number]) []  - Hearing / Language / Visual special needs 0 []  - Assessment of Community assistance (transportation, D/C planning, etc.) 0 []  - Additional assistance / Altered mentation 0 []  - Support Surface(s) Assessment (bed, cushion, seat, etc.) 0 INTERVENTIONS - Miscellaneous []  - External ear exam 0 []  - Patient Transfer (multiple staff / Civil Service fast streamer / Similar devices) 0 []  - Simple Staple / Suture  removal (25 or less) 0 []  - Complex Staple / Suture removal (26 or more) 0 []  - Hypo/Hyperglycemic Management (do not check if billed separately) 0 X - Ankle / Brachial Index (ABI) - do not check if billed separately 1 15 Has the patient been seen at the hospital within the last three years:  Yes Total Score: 90 Level Of Care: New/Established - Level 3 Electronic Signature(s) Signed: 05/28/2017 4:59:56 PM By: Montey Hora Entered By: Montey Hora on 05/28/2017 10:06:55 Verbeke, [name] ([id number]) -------------------------------------------------------------------------------- Encounter Discharge Information Details Patient Name: [name] Date of Service: 05/28/2017 8:00 AM Medical Record Number: [id number] Patient Account Number: [id number] Date of Birth/Sex: 01-26-1926 (83 y.o. Female) Treating RN: Montey Hora Primary Care Tyrisha Benninger: Lucianne Lei Other Clinician: Referring Clovis Warwick: Lucianne Lei Treating Nyesha Cliff/Extender: Frann Rider in Treatment: 0 Encounter Discharge Information Items Discharge Pain Level: 0 Discharge Condition: Stable Ambulatory Status: Ambulatory Discharge Destination: Home Transportation: Private Auto Accompanied By: self Schedule Follow-up Appointment: Yes Medication Reconciliation completed and provided to Patient/Care No Jenaro Souder: Provided on Clinical Summary of Care: 05/28/2017 Form Type Recipient Paper Patient GW Electronic Signature(s) Signed: 05/28/2017 10:09:39 AM By: Montey Hora Previous Signature: 05/28/2017 9:04:11 AM Version By: Ruthine Dose Entered By: Montey Hora on 05/28/2017 10:09:38 Laskin, [name] ([id number]) -------------------------------------------------------------------------------- Lower Extremity Assessment Details Patient Name: [name] Date of Service: 05/28/2017 8:00 AM Medical Record Number: [id number] Patient Account Number: [id number] Date of Birth/Sex: 1926-06-24 (83  y.o. Female) Treating RN: Montey Hora Primary Care Nilesh Stegall: Lucianne Lei Other Clinician: Referring Semiyah Newgent: Lucianne Lei Treating Lavere Shinsky/Extender: Frann Rider in Treatment: 0 Edema Assessment Assessed: [Left: No] [Right: No] Edema: [Left: No] [Right: No] Calf Left: Right: Point of Measurement: 33 cm From Medial Instep 27.3 cm 28 cm Ankle Left: Right: Point of Measurement: 10 cm From Medial Instep 18 cm 18.3 cm Vascular Assessment Pulses: Dorsalis Pedis Palpable: [Left:No] [Right:No] Doppler Audible: [Left:Yes] [Right:Yes] Posterior Tibial Palpable: [Left:No] [Right:No] Doppler Audible: [Left:Yes] [Right:Yes] Extremity colors, hair growth, and conditions: Extremity Color: [Left:Normal] [Right:Normal] Hair Growth on Extremity: [Left:No] [Right:No] Temperature of Extremity: [Left:Warm] [Right:Warm] Capillary Refill: [Left:< 3 seconds] [Right:< 3 seconds] Toe Nail Assessment Left: Right: Thick: Yes Yes Discolored: Yes Yes Deformed: No No Improper Length and Hygiene: No No Notes ABI [place] BILATERAL >220 Demirjian, Kyliyah P. ([id number]) Electronic Signature(s) Signed: 05/28/2017 4:59:56 PM By: Montey Hora Entered By: Montey Hora on 05/28/2017 08:49:10 Reetz, KWIOXBDZ P. ([id number]) -------------------------------------------------------------------------------- Multi Wound Chart Details Patient Name: [name] Date of Service: 05/28/2017 8:00 AM Medical Record Number: [id number] Patient Account Number: [id number] Date of Birth/Sex: 18-Sep-1926 (83 y.o. Female) Treating RN: Montey Hora Primary Care Katelin Kutsch: Lucianne Lei Other Clinician: Referring Odetta Forness: Lucianne Lei Treating Crayton Savarese/Extender: Frann Rider in Treatment: 0 Vital Signs Height(in): 65 Pulse(bpm): 72 Weight(lbs): 131 Blood Pressure 174/62 (mmHg): Body Mass Index(BMI): 22 Temperature(F): 98.2 Respiratory Rate 16 (breaths/min): Photos: [1:No Photos]  [N/A:N/A] Wound Location: [1:Left Lower Leg - Anterior] [N/A:N/A] Wounding Event: [1:Trauma] [N/A:N/A] Primary Etiology: [1:Trauma, Other] [N/A:N/A] Comorbid History: [1:Anemia, Hypertension, Type II Diabetes, Osteoarthritis] [N/A:N/A] Date Acquired: [1:05/02/2017] [N/A:N/A] Weeks of Treatment: [1:0] [N/A:N/A] Wound Status: [1:Open] [N/A:N/A] Measurements L x W x D 6.8x0.9x0.2 [N/A:N/A] (cm) Area (cm) : [1:4.807] [N/A:N/A] Volume (cm) : [1:0.961] [N/A:N/A] Classification: [1:Full Thickness Without Exposed Support Structures] [N/A:N/A] HBO Classification: [1:Grade 1] [N/A:N/A] Exudate Amount: [1:Large] [N/A:N/A] Exudate Type: [1:Serous] [N/A:N/A] Exudate Color: [1:amber] [N/A:N/A] Wound Margin: [1:Flat and Intact] [N/A:N/A] Granulation Amount: [1:None Present (0%)] [N/A:N/A] Necrotic Amount: [1:Large (67-100%)] [N/A:N/A] Necrotic Tissue: [1:Eschar, Adherent Slough] [N/A:N/A] Exposed Structures: [1:Fascia: No Fat Layer (Subcutaneous Tissue) Exposed: No Tendon: No Muscle: No] [N/A:N/A] Joint: No Bone: No Epithelialization: None N/A N/A Debridement: Debridement ([id number]- N/A N/A 11047) Pre-procedure 08:48 N/A N/A Verification/Time Out Taken: Pain Control: Lidocaine 4% Topical N/A N/A Solution Tissue Debrided: Necrotic/Eschar, N/A N/A Fibrin/Slough, Subcutaneous Level: Skin/Subcutaneous N/A  N/A Tissue Debridement Area (sq 6.12 N/A N/A cm): Instrument: Forceps N/A N/A Bleeding: Minimum N/A N/A Hemostasis Achieved: Pressure N/A N/A Procedural Pain: 0 N/A N/A Post Procedural Pain: 0 N/A N/A Debridement Treatment Procedure was tolerated N/A N/A Response: well Post Debridement 6.8x0.9x0.2 N/A N/A Measurements L x W x D (cm) Post Debridement 0.961 N/A N/A Volume: (cm) Periwound Skin Texture: Excoriation: No N/A N/A Induration: No Callus: No Crepitus: No Rash: No Scarring: No Periwound Skin Maceration: No N/A N/A Moisture: Dry/Scaly: No Periwound Skin Color:  Erythema: Yes N/A N/A Atrophie Blanche: No Cyanosis: No Ecchymosis: No Hemosiderin Staining: No Mottled: No Pallor: No Rubor: No Erythema Location: Circumferential N/A N/A Temperature: No Abnormality N/A N/A Tenderness on Yes N/A N/A Palpation: Wound Preparation: N/A N/A CHRISTY, EHRSAM ([id number]) Ulcer Cleansing: Rinsed/Irrigated with Saline Topical Anesthetic Applied: Other: lidocaine 4% Procedures Performed: Debridement N/A N/A Treatment Notes Electronic Signature(s) Signed: 05/28/2017 9:00:54 AM By: Christin Fudge MD, FACS Entered By: Christin Fudge on 05/28/2017 09:00:54 [name] ([id number]) -------------------------------------------------------------------------------- Multi-Disciplinary Care Plan Details Patient Name: [name] Date of Service: 05/28/2017 8:00 AM Medical Record Number: [id number] Patient Account Number: [id number] Date of Birth/Sex: 01/31/1926 (83 y.o. Female) Treating RN: Montey Hora Primary Care Itzia Cunliffe: Lucianne Lei Other Clinician: Referring Abiageal Blowe: Lucianne Lei Treating Baraa Tubbs/Extender: Frann Rider in Treatment: 0 Active Inactive ` Abuse / Safety / Falls / Self Care Management Nursing Diagnoses: Potential for falls Goals: Patient will remain injury free related to falls Date Initiated: 05/28/2017 Target Resolution Date: 07/23/2017 Goal Status: Active Interventions: Assess fall risk on admission and as needed Notes: ` Orientation to the Wound Care Program Nursing Diagnoses: Knowledge deficit related to the wound healing center program Goals: Patient/caregiver will verbalize understanding of the [facility] Date Initiated: 05/28/2017 Target Resolution Date: 07/23/2017 Goal Status: Active Interventions: Provide education on orientation to the wound center Notes: ` Wound/Skin Impairment Nursing Diagnoses: Impaired tissue integrity Lobdell, Debora P.  ([id number]) Goals: Ulcer/skin breakdown will have a volume reduction of 30% by week 4 Date Initiated: 05/28/2017 Target Resolution Date: 07/23/2017 Goal Status: Active Ulcer/skin breakdown will have a volume reduction of 50% by week 8 Date Initiated: 05/28/2017 Target Resolution Date: 07/23/2017 Goal Status: Active Ulcer/skin breakdown will have a volume reduction of 80% by week 12 Date Initiated: 05/28/2017 Target Resolution Date: 07/23/2017 Goal Status: Active Ulcer/skin breakdown will heal within 14 weeks Date Initiated: 05/28/2017 Target Resolution Date: 07/23/2017 Goal Status: Active Interventions: Assess patient/caregiver ability to obtain necessary supplies Assess patient/caregiver ability to perform ulcer/skin care regimen upon admission and as needed Assess ulceration(s) every visit Notes: Electronic Signature(s) Signed: 05/28/2017 4:59:56 PM By: Montey Hora Entered By: Montey Hora on 05/28/2017 08:47:31 Rao, HOZYYQMG P. ([id number]) -------------------------------------------------------------------------------- Pain Assessment Details Patient Name: [name] Date of Service: 05/28/2017 8:00 AM Medical Record Number: [id number] Patient Account Number: [id number] Date of Birth/Sex: [birth year] (83 y.o. Female) Treating RN: Montey Hora Primary Care Geraldyne Barraclough: Lucianne Lei Other Clinician: Referring Aylyn Wenzler: Lucianne Lei Treating Aveion Nguyen/Extender: Frann Rider in Treatment: 0 Active Problems Location of Pain Severity and Description of Pain Patient Has Paino Yes Site Locations Pain Location: Pain in Ulcers With Dressing Change: No Duration of the Pain. Constant / Intermittento Intermittent Pain Management and Medication Current Pain Management: Notes Topical or injectable lidocaine is offered to patient for acute pain when surgical debridement is performed. If needed, Patient is instructed to use over the counter pain medication for the  following 24-48 hours after debridement. Wound care MDs  do not prescribed pain medications. Patient has chronic pain or uncontrolled pain. Patient has been instructed to make an appointment with their Primary Care Physician for pain management. Electronic Signature(s) Signed: 05/28/2017 4:59:56 PM By: Montey Hora Entered By: Montey Hora on 05/28/2017 08:11:32 [name] ([id number]) -------------------------------------------------------------------------------- Patient/Caregiver Education Details Patient Name: [name] Date of Service: 05/28/2017 8:00 AM Medical Record Number: [id number] Patient Account Number: [id number] Date of Birth/Gender: 02-20-1926 (83 y.o. Female) Treating RN: Montey Hora Primary Care Physician: Lucianne Lei Other Clinician: Referring Physician: Lucianne Lei Treating Physician/Extender: Frann Rider in Treatment: 0 Education Assessment Education Provided To: Patient Education Topics Provided Wound/Skin Impairment: Handouts: Other: wound care as ordered Methods: Demonstration, Explain/Verbal Responses: State content correctly Electronic Signature(s) Signed: 05/28/2017 4:59:56 PM By: Montey Hora Entered By: Montey Hora on 05/28/2017 10:09:59 [place], [name] ([id number]) -------------------------------------------------------------------------------- Wound Assessment Details Patient Name: [name] Date of Service: 05/28/2017 8:00 AM Medical Record Number: [id number] Patient Account Number: [id number] Date of Birth/Sex: 1926-10-28 (83 y.o. Female) Treating RN: Montey Hora Primary Care Navy Belay: Lucianne Lei Other Clinician: Referring Mikea Quadros: Lucianne Lei Treating Marykate Heuberger/Extender: Frann Rider in Treatment: 0 Wound Status Wound Number: 1 Primary Diabetic Wound/Ulcer of the Lower Etiology: Extremity Wound Location: Left Lower Leg - Anterior Secondary Trauma, Other Wounding Event:  Trauma Etiology: Date Acquired: 05/02/2017 Wound Status: Open Weeks Of Treatment: 0 Comorbid Anemia, Hypertension, Type II Clustered Wound: No History: Diabetes, Osteoarthritis Photos Wound Measurements Length: (cm) 6.8 Width: (cm) 0.9 Depth: (cm) 0.2 Area: (cm) 4.807 Volume: (cm) 0.961 % Reduction in Area: 0% % Reduction in Volume: 0% Epithelialization: None Tunneling: No Undermining: No Wound Description Classification: Grade 1 Wound Margin: Flat and Intact Exudate Amount: Large Exudate Type: Serous Exudate Color: amber Foul Odor After Cleansing: No Slough/Fibrino Yes Wound Bed Granulation Amount: None Present (0%) Exposed Structure Necrotic Amount: Large (67-100%) Fascia Exposed: No Necrotic Quality: Eschar, Adherent Slough Fat Layer (Subcutaneous Tissue) Exposed: No Tendon Exposed: No Muscle Exposed: No Brigham, Aleenah P. ([id number]) Joint Exposed: No Bone Exposed: No Periwound Skin Texture Texture Color No Abnormalities Noted: No No Abnormalities Noted: No Callus: No Atrophie Blanche: No Crepitus: No Cyanosis: No Excoriation: No Ecchymosis: No Induration: No Erythema: Yes Rash: No Erythema Location: Circumferential Scarring: No Hemosiderin Staining: No Mottled: No Moisture Pallor: No No Abnormalities Noted: No Rubor: No Dry / Scaly: No Maceration: No Temperature / Pain Temperature: No Abnormality Tenderness on Palpation: Yes Wound Preparation Ulcer Cleansing: Rinsed/Irrigated with Saline Topical Anesthetic Applied: Other: lidocaine 4%, Treatment Notes Wound #1 (Left, Anterior Lower Leg) 1. Cleansed with: Clean wound with Normal Saline 2. Anesthetic Topical Lidocaine 4% cream to wound bed prior to debridement 4. Dressing Applied: Medihoney Gel 5. Secondary Dressing Applied Gauze and Kerlix/Conform 7. Secured with Tape Notes netting Electronic Signature(s) Signed: 08/05/2017 11:58:14 AM By: Montey Hora Previous Signature:  05/28/2017 4:59:56 PM Version By: Montey Hora Entered By: Montey Hora on 08/05/2017 11:58:14 Hunnell, [name] ([id number]) -------------------------------------------------------------------------------- Vitals Details Patient Name: [name] Date of Service: 05/28/2017 8:00 AM Medical Record Number: [id number] Patient Account Number: [id number] Date of Birth/Sex: 1926/05/28 (83 y.o. Female) Treating RN: Montey Hora Primary Care Leann Mayweather: Lucianne Lei Other Clinician: Referring Akane Tessier: Lucianne Lei Treating Dinesha Twiggs/Extender: Frann Rider in Treatment: 0 Vital Signs Time Taken: 08:11 Temperature (F): 98.2 Height (in): 65 Pulse (bpm): 72 Source: Measured Respiratory Rate (breaths/min): 16 Weight (lbs): 131 Blood Pressure (mmHg): 174/62 Source: Measured Reference Range: 80 - 120 mg / dl Body Mass Index (BMI): 21.8  Electronic Signature(s) Signed: 05/28/2017 4:59:56 PM By: Montey Hora Entered By: Montey Hora on 05/28/2017 08:13:59

## 2017-05-30 NOTE — Progress Notes (Signed)
[name] ([id number]) Visit Report for 05/28/2017 Abuse/Suicide Risk Screen Details Patient Name: [name], [name] Date of Service: 05/28/2017 8:00 AM Medical Record Number: [id number] Patient Account Number: [id number] Date of Birth/Sex: January 22, 1926 (83 y.o. Female) Treating RN: Montey Hora Primary Care Adeola Dennen: Lucianne Lei Other Clinician: Referring Mateusz Neilan: Robyn Haber Treating Micael Barb/Extender: Frann Rider in Treatment: 0 Abuse/Suicide Risk Screen Items Answer ABUSE/SUICIDE RISK SCREEN: Has anyone close to you tried to hurt or harm you recentlyo No Do you feel uncomfortable with anyone in your familyo No Has anyone forced you do things that you didnot want to doo No Do you have any thoughts of harming yourselfo No Patient displays signs or symptoms of abuse and/or neglect. No Electronic Signature(s) Signed: 05/28/2017 4:59:56 PM By: Montey Hora Entered By: Montey Hora on 05/28/2017 08:12:53 Hagos, [name] [name] ([id number]) -------------------------------------------------------------------------------- Activities of Daily Living Details Patient Name: Alexa Lowes P. Date of Service: 05/28/2017 8:00 AM Medical Record Number: [id number] Patient Account Number: [id number] Date of Birth/Sex: 1926/01/16 (83 y.o. Female) Treating RN: Montey Hora Primary Care Riddhi Grether: Lucianne Lei Other Clinician: Referring Yaelis Scharfenberg: Robyn Haber Treating Deundra Furber/Extender: Frann Rider in Treatment: 0 Activities of Daily Living Items Answer Activities of Daily Living (Please select one for each item) Drive Automobile Completely Able Take Medications Completely Able Use Telephone Completely Able Care for Appearance Completely Able Use Toilet Completely Able Bath / Shower Completely Able Dress Self Completely Able Feed Self Completely Able Walk Completely Able Get In / Out Bed Completely Able Housework Completely Able Prepare Meals  Completely Able Handle Money Completely Able Shop for Self Completely Able Electronic Signature(s) Signed: 05/28/2017 4:59:56 PM By: Montey Hora Entered By: Montey Hora on 05/28/2017 08:13:12 Kleman, GUYQIHKV Q. ([id number]) -------------------------------------------------------------------------------- Education Assessment Details Patient Name: Alexa Lowes P. Date of Service: 05/28/2017 8:00 AM Medical Record Number: [id number] Patient Account Number: [id number] Date of Birth/Sex: 08-May-1926 (83 y.o. Female) Treating RN: Montey Hora Primary Care Davidson Palmieri: Lucianne Lei Other Clinician: Referring Wilma Wuthrich: Robyn Haber Treating Gerre Ranum/Extender: Frann Rider in Treatment: 0 Primary Learner Assessed: Patient Learning Preferences/Education Level/Primary Language Learning Preference: Explanation, Demonstration Highest Education Level: High School Preferred Language: English Cognitive Barrier Assessment/Beliefs Language Barrier: No Translator Needed: No Memory Deficit: No Emotional Barrier: No Cultural/Religious Beliefs Affecting Medical No Care: Physical Barrier Assessment Impaired Vision: No Impaired Hearing: No Decreased Hand dexterity: No Knowledge/Comprehension Assessment Knowledge Level: Medium Comprehension Level: Medium Ability to understand written Medium instructions: Ability to understand verbal Medium instructions: Motivation Assessment Anxiety Level: Calm Cooperation: Cooperative Education Importance: Acknowledges Need Interest in Health Problems: Asks Questions Perception: Coherent Willingness to Engage in Self- Medium Management Activities: Readiness to Engage in Self- Medium Management Activities: Electronic Signature(s) [name] ([id number]) Signed: 05/28/2017 4:59:56 PM By: Montey Hora Entered By: Montey Hora on 05/28/2017 08:13:37 [name]  ([id number]) -------------------------------------------------------------------------------- Fall Risk Assessment Details Patient Name: Alexa Lowes P. Date of Service: 05/28/2017 8:00 AM Medical Record Number: [id number] Patient Account Number: [id number] Date of Birth/Sex: 11/15/1926 (83 y.o. Female) Treating RN: Montey Hora Primary Care Kaede Clendenen: Lucianne Lei Other Clinician: Referring Derrion Tritz: Robyn Haber Treating Kaytelyn Glore/Extender: Frann Rider in Treatment: 0 Fall Risk Assessment Items Have you had 2 or more falls in the last 12 monthso 0 No Have you had any fall that resulted in injury in the last 12 monthso 0 No FALL RISK ASSESSMENT: History of falling - immediate or within 3 months 0 No Secondary diagnosis 0 No Ambulatory aid None/bed rest/wheelchair/nurse 0 No Crutches/cane/walker 15 Yes Furniture 0  No IV Access/Saline Lock 0 No Gait/Training Normal/bed rest/immobile 0 No Weak 10 Yes Impaired 0 No Mental Status Oriented to own ability 0 Yes Electronic Signature(s) Signed: 05/28/2017 4:59:56 PM By: Montey Hora Entered By: Montey Hora on 05/28/2017 08:14:47 Crosby, [name] ([id number]) -------------------------------------------------------------------------------- Foot Assessment Details Patient Name: Alexa Lowes P. Date of Service: 05/28/2017 8:00 AM Medical Record Number: [id number] Patient Account Number: [id number] Date of Birth/Sex: 1926/11/23 (83 y.o. Female) Treating RN: Montey Hora Primary Care Pravin Perezperez: Lucianne Lei Other Clinician: Referring Zi Newbury: Robyn Haber Treating Kimon Loewen/Extender: Frann Rider in Treatment: 0 Foot Assessment Items Site Locations + = Sensation present, - = Sensation absent, C = Callus, U = Ulcer R = Redness, W = Warmth, M = Maceration, PU = Pre-ulcerative lesion F = Fissure, S = Swelling, D = Dryness Assessment Right: Left: Other Deformity: No No Prior Foot Ulcer: No No Prior  Amputation: No No Charcot Joint: No No Ambulatory Status: Ambulatory Without Help Gait: Steady Electronic Signature(s) Signed: 05/28/2017 4:59:56 PM By: Montey Hora Entered By: Montey Hora on 05/28/2017 08:15:16 Chizmar, QMVHQION P. ([id number]) -------------------------------------------------------------------------------- Nutrition Risk Assessment Details Patient Name: Alexa Lowes P. Date of Service: 05/28/2017 8:00 AM Medical Record Number: [id number] Patient Account Number: [id number] Date of Birth/Sex: 27-Mar-1926 (83 y.o. Female) Treating RN: Montey Hora Primary Care Devyne Hauger: Lucianne Lei Other Clinician: Referring Reesa Gotschall: Robyn Haber Treating Elizabet Schweppe/Extender: Frann Rider in Treatment: 0 Height (in): 65 Weight (lbs): 131 Body Mass Index (BMI): 21.8 Nutrition Risk Assessment Items NUTRITION RISK SCREEN: I have an illness or condition that made me change the kind and/or 0 No amount of food I eat I eat fewer than two meals per day 0 No I eat few fruits and vegetables, or milk products 0 No I have three or more drinks of beer, liquor or wine almost every day 0 No I have tooth or mouth problems that make it hard for me to eat 0 No I don't always have enough money to buy the food I need 0 No I eat alone most of the time 0 No I take three or more different prescribed or over-the-counter drugs a 1 Yes day Without wanting to, I have lost or gained 10 pounds in the last six 0 No months I am not always physically able to shop, cook and/or feed myself 0 No Nutrition Protocols Good Risk Protocol 0 No interventions needed Moderate Risk Protocol Electronic Signature(s) Signed: 05/28/2017 4:59:56 PM By: Montey Hora Entered By: Montey Hora on 05/28/2017 08:14:54

## 2017-06-04 ENCOUNTER — Encounter: Payer: Medicare Other | Attending: Physician Assistant | Admitting: Physician Assistant

## 2017-06-04 DIAGNOSIS — Z7984 Long term (current) use of oral hypoglycemic drugs: Secondary | ICD-10-CM | POA: Insufficient documentation

## 2017-06-04 DIAGNOSIS — I10 Essential (primary) hypertension: Secondary | ICD-10-CM | POA: Diagnosis not present

## 2017-06-04 DIAGNOSIS — L97222 Non-pressure chronic ulcer of left calf with fat layer exposed: Secondary | ICD-10-CM | POA: Insufficient documentation

## 2017-06-04 DIAGNOSIS — D649 Anemia, unspecified: Secondary | ICD-10-CM | POA: Insufficient documentation

## 2017-06-04 DIAGNOSIS — Z79899 Other long term (current) drug therapy: Secondary | ICD-10-CM | POA: Insufficient documentation

## 2017-06-04 DIAGNOSIS — E11622 Type 2 diabetes mellitus with other skin ulcer: Secondary | ICD-10-CM | POA: Insufficient documentation

## 2017-06-04 DIAGNOSIS — M1990 Unspecified osteoarthritis, unspecified site: Secondary | ICD-10-CM | POA: Insufficient documentation

## 2017-06-07 NOTE — Progress Notes (Addendum)
ANAHY, ESH ([id number]) Visit Report for 06/04/2017 Chief Complaint Document Details Patient Name: [name], [name] Date of Service: 06/04/2017 11:15 AM Medical Record Number: [id number] Patient Account Number: [id number] Date of Birth/Sex: Sep 03, 1926 (83 y.o. Female) Treating RN: Afful, RN, BSN, Velva Harman Primary Care Provider: Lucianne Lei Other Clinician: Referring Provider: Lucianne Lei Treating Provider/Extender: Melburn Hake, Hyacinth Marcelli Weeks in Treatment: 1 Information Obtained from: Patient Chief Complaint Patients presents for treatment of an open diabetic ulcer 2 the left lower anterior leg which she sustained an injury by blunt trauma Electronic Signature(s) Signed: 06/04/2017 4:55:59 PM By: Worthy Keeler PA-C Entered By: Worthy Keeler on 06/04/2017 16:49:00 Gwinner, Cruz P. ([id number]) -------------------------------------------------------------------------------- HPI Details Patient Name: Alexa Lowes P. Date of Service: 06/04/2017 11:15 AM Medical Record Number: [id number] Patient Account Number: [id number] Date of Birth/Sex: 1926-11-04 (83 y.o. Female) Treating RN: Afful, RN, BSN, Velva Harman Primary Care Provider: Lucianne Lei Other Clinician: Referring Provider: Lucianne Lei Treating Provider/Extender: Melburn Hake, Oniel Meleski Weeks in Treatment: 1 History of Present Illness Location: left anterior shin Quality: Patient reports experiencing a dull pain to affected area(s). Severity: Patient states wound are getting worse. Duration: Patient has had the wound for < 3 weeks prior to presenting for treatment Timing: Pain in wound is Intermittent (comes and goes Context: The wound occurred when the patient blunt trauma against her left shin Modifying Factors: Other treatment(s) tried include:active Bactroban ointment locally Associated Signs and Symptoms: Patient reports having: occasional discharge from the wound HPI Description: 83 year old patient was seen in the ED about 10  days ago for a history of abrasion to the left lower extremity while she was boarding a bus and scraped the anterior part of her left leg. She has been a diabetic for many years and has been taking treatment regularly. Her past medical history is also significant for anemia arthritis constipation and hypertension and is status post abdominal hysterectomy. She has never been a smoker. after her ER visit she was advised to apply Bactroban ointment to the wound twice a day and continue to monitor her blood glucose levels. her last hemoglobin A1c was done 3 years ago and was 6.6% 06/04/17 patient left anterior shin wound appears to be doing well although it is still somewhat dry despite the treatment with Medihoney. We have been using Kerlex following the Medihoney application and I believe this is just not retaining that much moisture. Nonetheless there is no evidence of infection. Electronic Signature(s) Signed: 06/04/2017 4:55:59 PM By: Worthy Keeler PA-C Entered By: Worthy Keeler on 06/04/2017 16:50:43 Crom, [name] ([id number]) -------------------------------------------------------------------------------- Physical Exam Details Patient Name: Alexa Lowes P. Date of Service: 06/04/2017 11:15 AM Medical Record Number: [id number] Patient Account Number: [id number] Date of Birth/Sex: Jun 16, 1926 (83 y.o. Female) Treating RN: Baruch Gouty, RN, BSN, Velva Harman Primary Care Provider: Lucianne Lei Other Clinician: Referring Provider: Lucianne Lei Treating Provider/Extender: STONE III, Danisa Kopec Weeks in Treatment: 1 Constitutional Thin and well-hydrated in no acute distress. Respiratory normal breathing without difficulty. clear to auscultation bilaterally. Psychiatric this patient is able to make decisions and demonstrates good insight into disease process. Alert and Oriented x 3. pleasant and cooperative. Notes Majority of patient's wound is still eschar covered. Unfortunately she is not able to  afford the Santyl which is why we are utilizing Medihoney. Electronic Signature(s) Signed: 06/04/2017 4:55:59 PM By: Worthy Keeler PA-C Entered By: Worthy Keeler on 06/04/2017 16:51:10 Chenoweth, [name] ([id number]) -------------------------------------------------------------------------------- Physician Orders Details Patient Name: Alexa Lowes P. Date of Service: 06/04/2017 11:15  AM Medical Record Number: [id number] Patient Account Number: [id number] Date of Birth/Sex: [birth year] (83 y.o. Female) Treating RN: Afful, RN, BSN, Velva Harman Primary Care Provider: Lucianne Lei Other Clinician: Referring Provider: Lucianne Lei Treating Provider/Extender: Melburn Hake, Zillah Alexie Weeks in Treatment: 1 Verbal / Phone Orders: No Diagnosis Coding ICD-10 Coding Code Description E11.622 Type 2 diabetes mellitus with other skin ulcer L97.222 Non-pressure chronic ulcer of left calf with fat layer exposed Wound Cleansing Wound #1 Left,Anterior Lower Leg o Clean wound with Normal Saline. o May Shower, gently pat wound dry prior to applying new dressing. Anesthetic Wound #1 Left,Anterior Lower Leg o Topical Lidocaine 4% cream applied to wound bed prior to debridement Primary Wound Dressing Wound #1 Left,Anterior Lower Leg o Medihoney gel Secondary Dressing Wound #1 Left,Anterior Lower Leg o Boardered Foam Dressing Dressing Change Frequency Wound #1 Left,Anterior Lower Leg o Change dressing every day. Follow-up Appointments Wound #1 Left,Anterior Lower Leg o Return Appointment in 1 week. Edema Control Wound #1 Left,Anterior Lower Leg o Elevate legs to the level of the heart and pump ankles as often as possible Oplinger, Sharena P. ([id number]) Additional Orders / Instructions Wound #1 Left,Anterior Lower Leg o Increase protein intake. o Other: - Please add vitamin A, vitamin C and zinc supplements to your diet Notes Recommend that we continue with the Current wound care  measures for the next week. However I am going to change in the fact that we are going to cover the wound with an Allevyn dressing following the application the Medihoney in order to help prevent moisture loss and hopefully loosen up the eschar of the majority of the wound. Patient is in agreement with the plan. If she had any issues prior to next week's evaluation she will contact our office for additional recommendations. Electronic Signature(s) Signed: 06/04/2017 4:55:59 PM By: Worthy Keeler PA-C Previous Signature: 06/04/2017 1:51:46 PM Version By: Regan Lemming BSN, RN Previous Signature: 06/04/2017 4:47:40 PM Version By: Worthy Keeler PA-C Entered By: Worthy Keeler on 06/04/2017 16:52:09 Borre, [name] ([id number]) -------------------------------------------------------------------------------- Problem List Details Patient Name: Alexa Lowes P. Date of Service: 06/04/2017 11:15 AM Medical Record Number: [id number] Patient Account Number: [id number] Date of Birth/Sex: 06-Jan-1926 (83 y.o. 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 ([id number]) -------------------------------------------------------------------------------- Progress Note Details Patient Name: Alexa Lowes P. Date of Service: 06/04/2017 11:15 AM Medical Record Number: [id number] Patient Account Number: [id number] Date of Birth/Sex: June 12, 1926 (83 y.o.  Female) Treating RN: Afful, RN, BSN, Velva Harman Primary Care Provider: Lucianne Lei Other Clinician: Referring Provider: Lucianne Lei Treating Provider/Extender: Melburn Hake, Jeanna Giuffre Weeks in Treatment: 1 Subjective Chief Complaint Information obtained from Patient Patients presents for treatment of an open diabetic ulcer 2 the left lower anterior leg which she sustained an injury by blunt trauma History of Present Illness (HPI) The following HPI elements were documented for the patient's wound: Location: left anterior shin Quality: Patient reports experiencing a dull pain to affected area(s). Severity: Patient states wound are getting worse. Duration: Patient has had the wound for < 3 weeks prior  to presenting for treatment Timing: Pain in wound is Intermittent (comes and goes Context: The wound occurred when the patient blunt trauma against her left shin Modifying Factors: Other treatment(s) tried include:active Bactroban ointment locally Associated Signs and Symptoms: Patient reports having: occasional discharge from the wound 83 year old patient was seen in the ED about 10 days ago for a history of abrasion to the left lower extremity while she was boarding a bus and scraped the anterior part of her left leg. She has been a diabetic for many years and has been taking treatment regularly. Her past medical history is also significant for anemia arthritis constipation and hypertension and is status post abdominal hysterectomy. She has never been a smoker. after her ER visit she was advised to apply Bactroban ointment to the wound twice a day and continue to monitor her blood glucose levels. her last hemoglobin A1c was done 3 years ago and was 6.6% 06/04/17 patient left anterior shin wound appears to be doing well although it is still somewhat dry despite the treatment with Medihoney. We have been using Kerlex following the Medihoney application and I believe this is just not retaining that much  moisture. Nonetheless there is no evidence of infection. Objective Grealish, Ciani P. ([id number]) Constitutional Thin and well-hydrated in no acute distress. Vitals Time Taken: 11:12 AM, Height: 65 in, Weight: 131 lbs, BMI: 21.8, Temperature: 98.3 F, Pulse: 92 bpm, Respiratory Rate: 16 breaths/min, Blood Pressure: 171/71 mmHg. Respiratory normal breathing without difficulty. clear to auscultation bilaterally. Psychiatric this patient is able to make decisions and demonstrates good insight into disease process. Alert and Oriented x 3. pleasant and cooperative. General Notes: Majority of patient's wound is still eschar covered. Unfortunately she is not able to afford the Santyl which is why we are utilizing Medihoney. Integumentary (Hair, Skin) Wound #1 status is Open. Original cause of wound was Trauma. The wound is located on the Left,Anterior Lower Leg. The wound measures 7cm length x 1cm width x 0.1cm depth; 5.498cm^2 area and 0.55cm^3 volume. There is Fat Layer (Subcutaneous Tissue) Exposed exposed. There is no tunneling or undermining noted. There is a medium amount of serous drainage noted. The wound margin is flat and intact. There is small (1-33%) pink, pale granulation within the wound bed. There is a large (67-100%) amount of necrotic tissue within the wound bed including Adherent Slough. The periwound skin appearance exhibited: Erythema. The periwound skin appearance did not exhibit: Callus, Crepitus, Excoriation, Induration, Rash, Scarring, Dry/Scaly, Maceration, Atrophie Blanche, Cyanosis, Ecchymosis, Hemosiderin Staining, Mottled, Pallor, Rubor. The surrounding wound skin color is noted with erythema which is circumferential. Periwound temperature was noted as No Abnormality. The periwound has tenderness on palpation. Assessment Active Problems ICD-10 E11.622 - Type 2 diabetes mellitus with other skin ulcer L97.222 - Non-pressure chronic ulcer of left calf with fat layer  exposed Plan Wound Cleansing: Bardwell, Jamelah P. ([id number]) Wound #1 Left,Anterior Lower Leg: Clean wound with Normal Saline. May Shower, gently pat wound dry prior to applying new dressing. Anesthetic: Wound #1 Left,Anterior Lower Leg: Topical Lidocaine 4% cream applied to wound bed prior to debridement Primary Wound Dressing: Wound #1 Left,Anterior Lower Leg: Medihoney gel Secondary Dressing: Wound #1 Left,Anterior Lower Leg: Boardered Foam Dressing Dressing Change Frequency: Wound #1 Left,Anterior Lower Leg: Change dressing every day. Follow-up Appointments: Wound #1 Left,Anterior Lower Leg: Return Appointment in 1 week. Edema Control: Wound #1 Left,Anterior Lower Leg: Elevate legs to the level of the heart and pump ankles as often as possible Additional Orders /  Instructions: Wound #1 Left,Anterior Lower Leg: Increase protein intake. Other: - Please add vitamin A, vitamin C and zinc supplements to your diet General Notes: Recommend that we continue with the Current wound care measures for the next week. However I am going to change in the fact that we are going to cover the wound with an Allevyn dressing following the application the Medihoney in order to help prevent moisture loss and hopefully loosen up the eschar of the majority of the wound. Patient is in agreement with the plan. If she had any issues prior to next week's evaluation she will contact our office for additional recommendations. Electronic Signature(s) Signed: 06/04/2017 4:55:59 PM By: Worthy Keeler PA-C Entered By: Worthy Keeler on 06/04/2017 16:52:17 Alverio, [name] ([id number]) -------------------------------------------------------------------------------- SuperBill Details Patient Name: Alexa Lowes P. Date of Service: 06/04/2017 Medical Record Number: [id number] Patient Account Number: [id number] Date of Birth/Sex: [birth year] (83 y.o. Female) Treating RN: Afful, RN, BSN, Velva Harman Primary  Care Provider: Lucianne Lei Other Clinician: Referring Provider: Lucianne Lei Treating Provider/Extender: Melburn Hake, Dreshawn Hendershott Weeks in Treatment: 1 Diagnosis Coding ICD-10 Codes Code Description E11.622 Type 2 diabetes mellitus with other skin ulcer L97.222 Non-pressure chronic ulcer of left calf with fat layer exposed Facility Procedures CPT4 Code: [id number] Description: [phone] - WOUND CARE VISIT-LEV 2 EST PT Modifier: Quantity: 1 Physician Procedures CPT4 Code: [id number] Description: 76226 - WC PHYS LEVEL 3 - EST PT ICD-10 Description Diagnosis E11.622 Type 2 diabetes mellitus with other skin ulcer L97.222 Non-pressure chronic ulcer of left calf with fat Modifier: layer exposed Quantity: 1 Electronic Signature(s) Signed: 06/04/2017 4:55:59 PM By: Worthy Keeler PA-C Entered By: Worthy Keeler on 06/04/2017 16:52:32

## 2017-06-07 NOTE — Progress Notes (Signed)
Alexa, [name] ([id number]) Visit Report for 06/04/2017 Arrival Information Details Patient Name: [name], [name] Date of Service: 06/04/2017 11:15 AM Medical Record Number: [id number] Patient Account Number: [id number] Date of Birth/Sex: 23-Jun-1926 (83 y.o. Female) Treating RN: Afful, RN, BSN, Velva Harman Primary Care Renata Gambino: Lucianne Lei Other Clinician: Referring Rayner Erman: Lucianne Lei Treating Loza Prell/Extender: Melburn Hake, HOYT Weeks in Treatment: 1 Visit Information History Since Last Visit All ordered tests and consults were completed: No Patient Arrived: Ambulatory Added or deleted any medications: No Arrival Time: 11:07 Any new allergies or adverse reactions: No Accompanied By: grddtr Had a fall or experienced change in No Transfer Assistance: None activities of daily living that may affect Patient Identification Verified: Yes risk of falls: Secondary Verification Yes Signs or symptoms of abuse/neglect since last No Process Completed: visito Patient Has Alerts: Yes Hospitalized since last visit: No Patient Alerts: DMII Has Dressing in Place as Prescribed: Yes ABI [place] BILATERAL Pain Present Now: No >220 Electronic Signature(s) Signed: 06/04/2017 1:51:46 PM By: Regan Lemming BSN, RN Entered By: Regan Lemming on 06/04/2017 11:12:00 Colberg, [name] ([id number]) -------------------------------------------------------------------------------- Clinic Level of Care Assessment Details Patient Name: Alexa Lowes P. Date of Service: 06/04/2017 11:15 AM Medical Record Number: [id number] Patient Account Number: [id number] Date of Birth/Sex: 04/12/1926 (83 y.o. Female) Treating RN: Afful, RN, BSN, [employer] Primary Care Zoltan Genest: Lucianne Lei Other Clinician: Referring Noemi Ishmael: Lucianne Lei Treating Verna Desrocher/Extender: Melburn Hake, HOYT Weeks in Treatment: 1 Clinic Level of Care Assessment Items TOOL 4 Quantity Score []  - Use when only an EandM is performed on FOLLOW-UP visit  0 ASSESSMENTS - Nursing Assessment / Reassessment X - Reassessment of Co-morbidities (includes updates in patient status) 1 10 X - Reassessment of Adherence to Treatment Plan 1 5 ASSESSMENTS - Wound and Skin Assessment / Reassessment X - Simple Wound Assessment / Reassessment - one wound 1 5 []  - Complex Wound Assessment / Reassessment - multiple wounds 0 []  - Dermatologic / Skin Assessment (not related to wound area) 0 ASSESSMENTS - Focused Assessment []  - Circumferential Edema Measurements - multi extremities 0 []  - Nutritional Assessment / Counseling / Intervention 0 []  - Lower Extremity Assessment (monofilament, tuning fork, pulses) 0 []  - Peripheral Arterial Disease Assessment (using hand held doppler) 0 ASSESSMENTS - Ostomy and/or Continence Assessment and Care []  - Incontinence Assessment and Management 0 []  - Ostomy Care Assessment and Management (repouching, etc.) 0 PROCESS - Coordination of Care X - Simple Patient / Family Education for ongoing care 1 15 []  - Complex (extensive) Patient / Family Education for ongoing care 0 []  - Staff obtains Programmer, systems, Records, Test Results / Process Orders 0 []  - Staff telephones HHA, Nursing Homes / Clarify orders / etc 0 []  - Routine Transfer to another Facility (non-emergent condition) 0 Miyoshi, Kenae P. ([id number]) []  - Routine Hospital Admission (non-emergent condition) 0 []  - New Admissions / Biomedical engineer / Ordering NPWT, Apligraf, etc. 0 []  - Emergency Hospital Admission (emergent condition) 0 []  - Simple Discharge Coordination 0 []  - Complex (extensive) Discharge Coordination 0 PROCESS - Special Needs []  - Pediatric / Minor Patient Management 0 []  - Isolation Patient Management 0 []  - Hearing / Language / Visual special needs 0 []  - Assessment of Community assistance (transportation, D/C planning, etc.) 0 []  - Additional assistance / Altered mentation 0 []  - Support Surface(s) Assessment (bed, cushion, seat, etc.)  0 INTERVENTIONS - Wound Cleansing / Measurement X - Simple Wound Cleansing - one wound 1 5 []  - Complex Wound Cleansing - multiple  wounds 0 []  - Wound Imaging (photographs - any number of wounds) 0 []  - Wound Tracing (instead of photographs) 0 []  - Simple Wound Measurement - one wound 0 []  - Complex Wound Measurement - multiple wounds 0 INTERVENTIONS - Wound Dressings X - Small Wound Dressing one or multiple wounds 1 10 []  - Medium Wound Dressing one or multiple wounds 0 []  - Large Wound Dressing one or multiple wounds 0 []  - Application of Medications - topical 0 []  - Application of Medications - injection 0 INTERVENTIONS - Miscellaneous []  - External ear exam 0 Lehrke, Nyela P. ([id number]) []  - Specimen Collection (cultures, biopsies, blood, body fluids, etc.) 0 []  - Specimen(s) / Culture(s) sent or taken to Lab for analysis 0 []  - Patient Transfer (multiple staff / Harrel Lemon Lift / Similar devices) 0 []  - Simple Staple / Suture removal (25 or less) 0 []  - Complex Staple / Suture removal (26 or more) 0 []  - Hypo / Hyperglycemic Management (close monitor of Blood Glucose) 0 []  - Ankle / Brachial Index (ABI) - do not check if billed separately 0 X - Vital Signs 1 5 Has the patient been seen at the hospital within the last three years: Yes Total Score: 55 Level Of Care: New/Established - Level 2 Electronic Signature(s) Signed: 06/04/2017 1:51:46 PM By: Regan Lemming BSN, RN Entered By: Regan Lemming on 06/04/2017 11:43:04 Test, [name] ([id number]) -------------------------------------------------------------------------------- Encounter Discharge Information Details Patient Name: Alexa Lowes P. Date of Service: 06/04/2017 11:15 AM Medical Record Number: [id number] Patient Account Number: [id number] Date of Birth/Sex: 27-Aug-1926 (83 y.o. Female) Treating RN: Afful, RN, BSN, Velva Harman Primary Care Andrae Claunch: Lucianne Lei Other Clinician: Referring Coleta Grosshans: Lucianne Lei Treating  Davian Hanshaw/Extender: Melburn Hake, HOYT Weeks in Treatment: 1 Encounter Discharge Information Items Discharge Pain Level: 0 Discharge Condition: Stable Ambulatory Status: Cane Discharge Destination: Home Private Transportation: Auto Accompanied By: grddtrt Schedule Follow-up Appointment: No Medication Reconciliation completed and No provided to Patient/Care Aline Wesche: Clinical Summary of Care: Electronic Signature(s) Signed: 06/04/2017 1:51:46 PM By: Regan Lemming BSN, RN Previous Signature: 06/04/2017 11:49:51 AM Version By: Ruthine Dose Entered By: Regan Lemming on 06/04/2017 11:50:04 Sugg, [name] ([id number]) -------------------------------------------------------------------------------- Lower Extremity Assessment Details Patient Name: Alexa Lowes P. Date of Service: 06/04/2017 11:15 AM Medical Record Number: [id number] Patient Account Number: [id number] Date of Birth/Sex: [birth year] (83 y.o. Female) Treating RN: Afful, RN, BSN, Velva Harman Primary Care Tascha Casares: Lucianne Lei Other Clinician: Referring Cheyla Duchemin: Lucianne Lei Treating Joleene Burnham/Extender: Melburn Hake, HOYT Weeks in Treatment: 1 Edema Assessment Assessed: [Left: No] [Right: No] E[Left: dema] [Right: :] Calf Left: Right: Point of Measurement: 33 cm From Medial Instep 27.2 cm cm Ankle Left: Right: Point of Measurement: 10 cm From Medial Instep 118 cm cm Vascular Assessment Claudication: Claudication Assessment [Left:None] Pulses: Dorsalis Pedis Palpable: [Left:Yes] Posterior Tibial Extremity colors, hair growth, and conditions: Extremity Color: [Left:Normal] Hair Growth on Extremity: [Left:No] Temperature of Extremity: [Left:Warm] Capillary Refill: [Left:< 3 seconds] Toe Nail Assessment Left: Right: Thick: Yes Discolored: Yes Deformed: No Improper Length and Hygiene: No Electronic Signature(s) Signed: 06/04/2017 1:51:46 PM By: Regan Lemming BSN, RN Entered By: Regan Lemming on 06/04/2017 11:13:25 Raney,  [name] ([id number]) Asquith, Priya P. ([id number]) -------------------------------------------------------------------------------- Multi Wound Chart Details Patient Name: Alexa Lowes P. Date of Service: 06/04/2017 11:15 AM Medical Record Number: [id number] Patient Account Number: [id number] Date of Birth/Sex: 1925/12/25 (83 y.o. Female) Treating RN: Afful, RN, BSN, Velva Harman Primary Care Esker Dever: Lucianne Lei Other Clinician: Referring Shante Archambeault: Lucianne Lei Treating Krishiv Sandler/Extender: STONE III, HOYT Weeks in  Treatment: 1 Vital Signs Height(in): 65 Pulse(bpm): 92 Weight(lbs): 131 Blood Pressure 171/71 (mmHg): Body Mass Index(BMI): 22 Temperature(F): 98.3 Respiratory Rate 16 (breaths/min): Photos: [1:No Photos] [N/A:N/A] Wound Location: [1:Left Lower Leg - Anterior] [N/A:N/A] Wounding Event: [1:Trauma] [N/A:N/A] Primary Etiology: [1:Trauma, Other] [N/A:N/A] Comorbid History: [1:Anemia, Hypertension, Type II Diabetes, Osteoarthritis] [N/A:N/A] Date Acquired: [1:05/02/2017] [N/A:N/A] Weeks of Treatment: [1:1] [N/A:N/A] Wound Status: [1:Open] [N/A:N/A] Measurements L x W x D 7x1x0.1 [N/A:N/A] (cm) Area (cm) : [1:5.498] [N/A:N/A] Volume (cm) : [1:0.55] [N/A:N/A] % Reduction in Area: [1:-14.40%] [N/A:N/A] % Reduction in Volume: 42.80% [N/A:N/A] Classification: [1:Full Thickness Without Exposed Support Structures] [N/A:N/A] HBO Classification: [1:Grade 1] [N/A:N/A] Exudate Amount: [1:Medium] [N/A:N/A] Exudate Type: [1:Serous] [N/A:N/A] Exudate Color: [1:amber] [N/A:N/A] Wound Margin: [1:Flat and Intact] [N/A:N/A] Granulation Amount: [1:None Present (0%)] [N/A:N/A] Necrotic Amount: [1:Large (67-100%)] [N/A:N/A] Necrotic Tissue: [1:Eschar, Adherent Slough] [N/A:N/A] Exposed Structures: [1:Fat Layer (Subcutaneous Tissue) Exposed: Yes Fascia: No] [N/A:N/A] Tendon: No Muscle: No Joint: No Bone: No Epithelialization: None N/A N/A Periwound Skin Texture: Excoriation:  No N/A N/A Induration: No Callus: No Crepitus: No Rash: No Scarring: No Periwound Skin Maceration: No N/A N/A Moisture: Dry/Scaly: No Periwound Skin Color: Erythema: Yes N/A N/A Atrophie Blanche: No Cyanosis: No Ecchymosis: No Hemosiderin Staining: No Mottled: No Pallor: No Rubor: No Erythema Location: Circumferential N/A N/A Temperature: No Abnormality N/A N/A Tenderness on Yes N/A N/A Palpation: Wound Preparation: Ulcer Cleansing: N/A N/A Rinsed/Irrigated with Saline Topical Anesthetic Applied: Other: lidocaine 4% Treatment Notes Electronic Signature(s) Signed: 06/04/2017 1:51:46 PM By: Regan Lemming BSN, RN Entered By: Regan Lemming on 06/04/2017 11:18:49 [name] ([id number]) -------------------------------------------------------------------------------- Multi-Disciplinary Care Plan Details Patient Name: Alexa Lowes P. Date of Service: 06/04/2017 11:15 AM Medical Record Number: [id number] Patient Account Number: [id number] Date of Birth/Sex: 10-Oct-1926 (83 y.o. Female) Treating RN: Afful, RN, BSN, Velva Harman Primary Care Cella Cappello: Lucianne Lei Other Clinician: Referring Geovanna Simko: Lucianne Lei Treating Josslin Sanjuan/Extender: Melburn Hake, HOYT Weeks in Treatment: 1 Active Inactive ` Abuse / Safety / Falls / Self Care Management Nursing Diagnoses: Potential for falls Goals: Patient will remain injury free related to falls Date Initiated: 05/28/2017 Target Resolution Date: 07/23/2017 Goal Status: Active Interventions: Assess fall risk on admission and as needed Notes: ` Orientation to the Wound Care Program Nursing Diagnoses: Knowledge deficit related to the wound healing center program Goals: Patient/caregiver will verbalize understanding of the [place] Program Date Initiated: 05/28/2017 Target Resolution Date: 07/23/2017 Goal Status: Active Interventions: Provide education on orientation to the wound center Notes: ` Wound/Skin  Impairment Nursing Diagnoses: Impaired tissue integrity Finnigan, Desirey P. ([id number]) Goals: Ulcer/skin breakdown will have a volume reduction of 30% by week 4 Date Initiated: 05/28/2017 Target Resolution Date: 07/23/2017 Goal Status: Active Ulcer/skin breakdown will have a volume reduction of 50% by week 8 Date Initiated: 05/28/2017 Target Resolution Date: 07/23/2017 Goal Status: Active Ulcer/skin breakdown will have a volume reduction of 80% by week 12 Date Initiated: 05/28/2017 Target Resolution Date: 07/23/2017 Goal Status: Active Ulcer/skin breakdown will heal within 14 weeks Date Initiated: 05/28/2017 Target Resolution Date: 07/23/2017 Goal Status: Active Interventions: Assess patient/caregiver ability to obtain necessary supplies Assess patient/caregiver ability to perform ulcer/skin care regimen upon admission and as needed Assess ulceration(s) every visit Notes: Electronic Signature(s) Signed: 06/04/2017 1:51:46 PM By: Regan Lemming BSN, RN Entered By: Regan Lemming on 06/04/2017 11:18:40 Azpeitia, Rashea P. ([id number]) -------------------------------------------------------------------------------- Pain Assessment Details Patient Name: Alexa Lowes P. Date of Service: 06/04/2017 11:15 AM Medical Record Number: [id number] Patient Account Number: [id number] Date of Birth/Sex: 06-19-1926 (83 y.o.  Female) Treating RN: Afful, RN, BSN, Velva Harman Primary Care Ilijah Doucet: Lucianne Lei Other Clinician: Referring Adelyna Brockman: Lucianne Lei Treating Laiken Nohr/Extender: Melburn Hake, HOYT Weeks in Treatment: 1 Active Problems Location of Pain Severity and Description of Pain Patient Has Paino Yes Site Locations Pain Location: Pain in Ulcers Rate the pain. Current Pain Level: 4 Character of Pain Describe the Pain: Tender Pain Management and Medication Current Pain Management: Medication: Yes Rest: Yes How does your wound impact your activities of daily livingo Sleep: Yes Bathing:  Yes Appetite: Yes Relationship With Others: Yes Bladder Continence: Yes Emotions: Yes Bowel Continence: Yes Work: Yes Toileting: Yes Drive: Yes Dressing: Yes Hobbies: Yes Electronic Signature(s) Signed: 06/04/2017 1:51:46 PM By: Regan Lemming BSN, RN Entered By: Regan Lemming on 06/04/2017 11:12:16 Felkins, [name] ([id number]) -------------------------------------------------------------------------------- Patient/Caregiver Education Details Patient Name: Alexa Lowes P. Date of Service: 06/04/2017 11:15 AM Medical Record Number: [id number] Patient Account Number: [id number] Date of Birth/Gender: 12-17-1925 (83 y.o. Female) Treating RN: Afful, RN, BSN, Velva Harman Primary Care Physician: Lucianne Lei Other Clinician: Referring Physician: Lucianne Lei Treating Physician/Extender: Sharalyn Ink in Treatment: 1 Education Assessment Education Provided To: Patient and Caregiver grd dtr Education Topics Provided Welcome To [facility] [place]: Methods: Explain/Verbal Responses: Reinforcements needed Wound/Skin Impairment: Methods: Explain/Verbal Responses: Reinforcements needed Electronic Signature(s) Signed: 06/04/2017 1:51:46 PM By: Regan Lemming BSN, RN Entered By: Regan Lemming on 06/04/2017 11:50:26 Ozanich, [name] ([id number]) -------------------------------------------------------------------------------- Wound Assessment Details Patient Name: Alexa Lowes P. Date of Service: 06/04/2017 11:15 AM Medical Record Number: [id number] Patient Account Number: [id number] Date of Birth/Sex: 1925/12/06 (83 y.o. Female) Treating RN: Afful, RN, BSN, Administrator, sports Primary Care Linnae Rasool: Lucianne Lei Other Clinician: Referring Francois Elk: Lucianne Lei Treating Charnise Lovan/Extender: Melburn Hake, HOYT Weeks in Treatment: 1 Wound Status Wound Number: 1 Primary Trauma, Other Etiology: Wound Location: Left Lower Leg - Anterior Wound Open Wounding Event: Trauma Status: Date Acquired:  05/02/2017 Comorbid Anemia, Hypertension, Type II Weeks Of Treatment: 1 History: Diabetes, Osteoarthritis Clustered Wound: No Photos Photo Uploaded By: Regan Lemming on 06/04/2017 13:56:41 Wound Measurements Length: (cm) 7 Width: (cm) 1 Depth: (cm) 0.1 Area: (cm) 5.498 Volume: (cm) 0.55 % Reduction in Area: -14.4% % Reduction in Volume: 42.8% Epithelialization: None Tunneling: No Undermining: No Wound Description Full Thickness Without Foul Odor Aft Classification: Exposed Support Structures Slough/Fibrin Diabetic Severity Grade 1 (Wagner): Wound Margin: Flat and Intact Minchey, Toneka P. ([id number]) er Cleansing: No o Yes Exudate Amount: Medium Exudate Type: Serous Exudate Color: amber Wound Bed Granulation Amount: Small (1-33%) Exposed Structure Granulation Quality: Pink, Pale Fascia Exposed: No Necrotic Amount: Large (67-100%) Fat Layer (Subcutaneous Tissue) Exposed: Yes Necrotic Quality: Adherent Slough Tendon Exposed: No Muscle Exposed: No Joint Exposed: No Bone Exposed: No Periwound Skin Texture Texture Color No Abnormalities Noted: No No Abnormalities Noted: No Callus: No Atrophie Blanche: No Crepitus: No Cyanosis: No Excoriation: No Ecchymosis: No Induration: No Erythema: Yes Rash: No Erythema Location: Circumferential Scarring: No Hemosiderin Staining: No Mottled: No Moisture Pallor: No No Abnormalities Noted: No Rubor: No Dry / Scaly: No Maceration: No Temperature / Pain Temperature: No Abnormality Tenderness on Palpation: Yes Wound Preparation Ulcer Cleansing: Rinsed/Irrigated with Saline Topical Anesthetic Applied: Other: lidocaine 4%, Treatment Notes Wound #1 (Left, Anterior Lower Leg) 1. Cleansed with: Clean wound with Normal Saline 3. Peri-wound Care: Skin Prep 4. Dressing Applied: Medihoney Gel 5. Secondary Dressing Applied Bordered Foam Dressing Notes netting MAPLE, ODANIEL ([id number]) Electronic  Signature(s) Signed: 06/04/2017 12:00:36 PM By: Regan Lemming BSN, RN Entered By: Regan Lemming on  06/04/2017 12:00:36 Dues, Blessings PMarland Kitchen ([id number]) -------------------------------------------------------------------------------- Vitals Details Patient Name: NECIE, WILCOXSON P. Date of Service: 06/04/2017 11:15 AM Medical Record Number: [id number] Patient Account Number: [id number] Date of Birth/Sex: [birth year] (83 y.o. Female) Treating RN: Afful, RN, BSN, Velva Harman Primary Care Britney Newstrom: Lucianne Lei Other Clinician: Referring Tonika Eden: Lucianne Lei Treating Janine Reller/Extender: Melburn Hake, HOYT Weeks in Treatment: 1 Vital Signs Time Taken: 11:12 Temperature (F): 98.3 Height (in): 65 Pulse (bpm): 92 Weight (lbs): 131 Respiratory Rate (breaths/min): 16 Body Mass Index (BMI): 21.8 Blood Pressure (mmHg): 171/71 Reference Range: 80 - 120 mg / dl Electronic Signature(s) Signed: 06/04/2017 1:51:46 PM By: Regan Lemming BSN, RN Entered By: Regan Lemming on 06/04/2017 11:13:57

## 2017-06-11 ENCOUNTER — Encounter: Payer: Medicare Other | Admitting: Physician Assistant

## 2017-06-11 DIAGNOSIS — E11622 Type 2 diabetes mellitus with other skin ulcer: Secondary | ICD-10-CM | POA: Diagnosis not present

## 2017-06-13 NOTE — Progress Notes (Signed)
[name], [name] ([id number]) Visit Report for 06/11/2017 Arrival Information Details Patient Name: [name], [name] Date of Service: 06/11/2017 3:30 PM Medical Record Number: [id number] Patient Account Number: [id number] Date of Birth/Sex: 1926-05-13 (83 y.o. Female) Treating RN: Montey Hora Primary Care Jamilah Jean: Lucianne Lei Other Clinician: Referring Cayli Escajeda: Lucianne Lei Treating Tyarra Nolton/Extender: Melburn Hake, HOYT Weeks in Treatment: 2 Visit Information History Since Last Visit Added or deleted any medications: No Patient Arrived: Cane Any new allergies or adverse reactions: No Arrival Time: 15:21 Had a fall or experienced change in No Accompanied By: dtr activities of daily living that may affect Transfer Assistance: None risk of falls: Patient Identification Verified: Yes Signs or symptoms of abuse/neglect since last No Secondary Verification Yes visito Process Completed: Hospitalized since last visit: No Patient Has Alerts: Yes Has Dressing in Place as Prescribed: Yes Patient Alerts: DMII Pain Present Now: No ABI [place] BILATERAL >220 Electronic Signature(s) Signed: 06/11/2017 4:22:27 PM By: Montey Hora Entered By: Montey Hora on 06/11/2017 15:22:43 Gorum, VBTYOMAY P. ([id number]) -------------------------------------------------------------------------------- Clinic Level of Care Assessment Details Patient Name: Alexa Lowes P. Date of Service: 06/11/2017 3:30 PM Medical Record Number: [id number] Patient Account Number: [id number] Date of Birth/Sex: 06/26/1926 (83 y.o. Female) Treating RN: Montey Hora Primary Care Kevontae Burgoon: Lucianne Lei Other Clinician: Referring Edahi Kroening: Lucianne Lei Treating Kinjal Neitzke/Extender: Melburn Hake, HOYT Weeks in Treatment: 2 Clinic Level of Care Assessment Items TOOL 4 Quantity Score []  - Use when only an EandM is performed on FOLLOW-UP visit 0 ASSESSMENTS - Nursing Assessment / Reassessment X - Reassessment of  Co-morbidities (includes updates in patient status) 1 10 X - Reassessment of Adherence to Treatment Plan 1 5 ASSESSMENTS - Wound and Skin Assessment / Reassessment X - Simple Wound Assessment / Reassessment - one wound 1 5 []  - Complex Wound Assessment / Reassessment - multiple wounds 0 []  - Dermatologic / Skin Assessment (not related to wound area) 0 ASSESSMENTS - Focused Assessment []  - Circumferential Edema Measurements - multi extremities 0 []  - Nutritional Assessment / Counseling / Intervention 0 X - Lower Extremity Assessment (monofilament, tuning fork, pulses) 1 5 []  - Peripheral Arterial Disease Assessment (using hand held doppler) 0 ASSESSMENTS - Ostomy and/or Continence Assessment and Care []  - Incontinence Assessment and Management 0 []  - Ostomy Care Assessment and Management (repouching, etc.) 0 PROCESS - Coordination of Care X - Simple Patient / Family Education for ongoing care 1 15 []  - Complex (extensive) Patient / Family Education for ongoing care 0 []  - Staff obtains Programmer, systems, Records, Test Results / Process Orders 0 []  - Staff telephones HHA, Nursing Homes / Clarify orders / etc 0 []  - Routine Transfer to another Facility (non-emergent condition) 0 [name], [name] ([id number]) []  - Routine Hospital Admission (non-emergent condition) 0 []  - New Admissions / Biomedical engineer / Ordering NPWT, Apligraf, etc. 0 []  - Emergency Hospital Admission (emergent condition) 0 X - Simple Discharge Coordination 1 10 []  - Complex (extensive) Discharge Coordination 0 PROCESS - Special Needs []  - Pediatric / Minor Patient Management 0 []  - Isolation Patient Management 0 []  - Hearing / Language / Visual special needs 0 []  - Assessment of Community assistance (transportation, D/C planning, etc.) 0 []  - Additional assistance / Altered mentation 0 []  - Support Surface(s) Assessment (bed, cushion, seat, etc.) 0 INTERVENTIONS - Wound Cleansing / Measurement X - Simple Wound  Cleansing - one wound 1 5 []  - Complex Wound Cleansing - multiple wounds 0 X - Wound Imaging (photographs - any number of wounds)  1 5 []  - Wound Tracing (instead of photographs) 0 X - Simple Wound Measurement - one wound 1 5 []  - Complex Wound Measurement - multiple wounds 0 INTERVENTIONS - Wound Dressings X - Small Wound Dressing one or multiple wounds 1 10 []  - Medium Wound Dressing one or multiple wounds 0 []  - Large Wound Dressing one or multiple wounds 0 X - Application of Medications - topical 1 5 []  - Application of Medications - injection 0 INTERVENTIONS - Miscellaneous []  - External ear exam 0 [name], [name] ([id number]) []  - Specimen Collection (cultures, biopsies, blood, body fluids, etc.) 0 []  - Specimen(s) / Culture(s) sent or taken to Lab for analysis 0 []  - Patient Transfer (multiple staff / Harrel Lemon Lift / Similar devices) 0 []  - Simple Staple / Suture removal (25 or less) 0 []  - Complex Staple / Suture removal (26 or more) 0 []  - Hypo / Hyperglycemic Management (close monitor of Blood Glucose) 0 []  - Ankle / Brachial Index (ABI) - do not check if billed separately 0 X - Vital Signs 1 5 Has the patient been seen at the hospital within the last three years: Yes Total Score: 85 Level Of Care: New/Established - Level 3 Electronic Signature(s) Signed: 06/11/2017 4:22:27 PM By: Montey Hora Entered By: Montey Hora on 06/11/2017 16:01:21 [name], Alexa E. ([id number]) -------------------------------------------------------------------------------- Encounter Discharge Information Details Patient Name: Alexa Lowes P. Date of Service: 06/11/2017 3:30 PM Medical Record Number: [id number] Patient Account Number: [id number] Date of Birth/Sex: October 27, 1926 (83 y.o. Female) Treating RN: Montey Hora Primary Care Dameer Speiser: Lucianne Lei Other Clinician: Referring Harpreet Pompey: Lucianne Lei Treating Seiji Wiswell/Extender: Melburn Hake, HOYT Weeks in Treatment: 2 Encounter  Discharge Information Items Discharge Pain Level: 0 Discharge Condition: Stable Ambulatory Status: Cane Discharge Destination: Home Transportation: Private Auto Accompanied By: granddaughter Schedule Follow-up Appointment: Yes Medication Reconciliation completed and provided to No Patient/Care Myrissa Chipley: Provided on Clinical Summary of Care: 06/11/2017 Form Type Recipient Paper Patient GW Electronic Signature(s) Signed: 06/11/2017 3:53:12 PM By: Ruthine Dose Entered By: Ruthine Dose on 06/11/2017 15:53:12 [name], [name] ([id number]) -------------------------------------------------------------------------------- Lower Extremity Assessment Details Patient Name: Alexa Lowes P. Date of Service: 06/11/2017 3:30 PM Medical Record Number: [id number] Patient Account Number: [id number] Date of Birth/Sex: 05-23-1926 (83 y.o. Female) Treating RN: Montey Hora Primary Care Angeleen Horney: Lucianne Lei Other Clinician: Referring Davanta Meuser: Lucianne Lei Treating Angla Delahunt/Extender: Melburn Hake, HOYT Weeks in Treatment: 2 Edema Assessment Assessed: [Left: No] [Right: No] E[Left: dema] [Right: :] Calf Left: Right: Point of Measurement: 33 cm From Medial Instep 27.1 cm cm Ankle Left: Right: Point of Measurement: 10 cm From Medial Instep 18.2 cm cm Vascular Assessment Pulses: Dorsalis Pedis Palpable: [Left:Yes] Posterior Tibial Extremity colors, hair growth, and conditions: Extremity Color: [Left:Normal] Hair Growth on Extremity: [Left:No] Temperature of Extremity: [Left:Warm] Capillary Refill: [Left:< 3 seconds] Electronic Signature(s) Signed: 06/11/2017 4:22:27 PM By: Montey Hora Entered By: Montey Hora on 06/11/2017 15:28:40 [name], [name] ([id number]) -------------------------------------------------------------------------------- Multi Wound Chart Details Patient Name: Alexa Lowes P. Date of Service: 06/11/2017 3:30 PM Medical Record Number:  [id number] Patient Account Number: [id number] Date of Birth/Sex: 05/05/1926 (83 y.o. Female) Treating RN: Montey Hora Primary Care Viet Kemmerer: Lucianne Lei Other Clinician: Referring Shlomo Seres: Lucianne Lei Treating Asser Lucena/Extender: Melburn Hake, HOYT Weeks in Treatment: 2 Vital Signs Height(in): 65 Pulse(bpm): 87 Weight(lbs): 131 Blood Pressure 172/62 (mmHg): Body Mass Index(BMI): 22 Temperature(F): 98.4 Respiratory Rate 18 (breaths/min): Photos: [1:No Photos] [N/A:N/A] Wound Location: [1:Left Lower Leg - Anterior] [N/A:N/A] Wounding Event: [1:Trauma] [N/A:N/A] Primary Etiology: [1:Trauma, Other] [N/A:N/A]  Comorbid History: [1:Anemia, Hypertension, Type II Diabetes, Osteoarthritis] [N/A:N/A] Date Acquired: [1:05/02/2017] [N/A:N/A] Weeks of Treatment: [1:2] [N/A:N/A] Wound Status: [1:Open] [N/A:N/A] Measurements L x W x D 6.6x0.8x0.1 [N/A:N/A] (cm) Area (cm) : [1:4.147] [N/A:N/A] Volume (cm) : [1:0.415] [N/A:N/A] % Reduction in Area: [1:13.70%] [N/A:N/A] % Reduction in Volume: 56.80% [N/A:N/A] Classification: [1:Full Thickness Without Exposed Support Structures] [N/A:N/A] HBO Classification: [1:Grade 1] [N/A:N/A] Exudate Amount: [1:Medium] [N/A:N/A] Exudate Type: [1:Serous] [N/A:N/A] Exudate Color: [1:amber] [N/A:N/A] Wound Margin: [1:Flat and Intact] [N/A:N/A] Granulation Amount: [1:Small (1-33%)] [N/A:N/A] Granulation Quality: [1:Pink, Pale] [N/A:N/A] Necrotic Amount: [1:Large (67-100%)] [N/A:N/A] Exposed Structures: [1:Fat Layer (Subcutaneous Tissue) Exposed: Yes Fascia: No] [N/A:N/A] Tendon: No Muscle: No Joint: No Bone: No Epithelialization: None N/A N/A Periwound Skin Texture: Excoriation: No N/A N/A Induration: No Callus: No Crepitus: No Rash: No Scarring: No Periwound Skin Maceration: No N/A N/A Moisture: Dry/Scaly: No Periwound Skin Color: Erythema: Yes N/A N/A Atrophie Blanche: No Cyanosis: No Ecchymosis: No Hemosiderin Staining: No Mottled:  No Pallor: No Rubor: No Erythema Location: Circumferential N/A N/A Temperature: No Abnormality N/A N/A Tenderness on Yes N/A N/A Palpation: Wound Preparation: Ulcer Cleansing: N/A N/A Rinsed/Irrigated with Saline Topical Anesthetic Applied: Other: lidocaine 4% Treatment Notes Electronic Signature(s) Signed: 06/11/2017 4:22:27 PM By: Montey Hora Entered By: Montey Hora on 06/11/2017 15:38:03 [name], [name] ([id number]) -------------------------------------------------------------------------------- [place] Details Patient Name: Alexa Lowes P. Date of Service: 06/11/2017 3:30 PM Medical Record Number: [id number] Patient Account Number: [id number] Date of Birth/Sex: [birth year] (83 y.o. Female) Treating RN: Montey Hora Primary Care Bettye Sitton: Lucianne Lei Other Clinician: Referring Ovida Delagarza: Lucianne Lei Treating Byron Tipping/Extender: Melburn Hake, HOYT Weeks in Treatment: 2 Active Inactive ` Abuse / Safety / Falls / Self Care Management Nursing Diagnoses: Potential for falls Goals: Patient will remain injury free related to falls Date Initiated: 05/28/2017 Target Resolution Date: 07/23/2017 Goal Status: Active Interventions: Assess fall risk on admission and as needed Notes: ` Orientation to the Wound Care Program Nursing Diagnoses: Knowledge deficit related to the wound healing center program Goals: Patient/caregiver will verbalize understanding of the [place] Program Date Initiated: 05/28/2017 Target Resolution Date: 07/23/2017 Goal Status: Active Interventions: Provide education on orientation to the wound center Notes: ` Wound/Skin Impairment Nursing Diagnoses: Impaired tissue integrity [name], Eira P. ([id number]) Goals: Ulcer/skin breakdown will have a volume reduction of 30% by week 4 Date Initiated: 05/28/2017 Target Resolution Date: 07/23/2017 Goal Status: Active Ulcer/skin breakdown will have a volume  reduction of 50% by week 8 Date Initiated: 05/28/2017 Target Resolution Date: 07/23/2017 Goal Status: Active Ulcer/skin breakdown will have a volume reduction of 80% by week 12 Date Initiated: 05/28/2017 Target Resolution Date: 07/23/2017 Goal Status: Active Ulcer/skin breakdown will heal within 14 weeks Date Initiated: 05/28/2017 Target Resolution Date: 07/23/2017 Goal Status: Active Interventions: Assess patient/caregiver ability to obtain necessary supplies Assess patient/caregiver ability to perform ulcer/skin care regimen upon admission and as needed Assess ulceration(s) every visit Notes: Electronic Signature(s) Signed: 06/11/2017 4:22:27 PM By: Montey Hora Entered By: Montey Hora on 06/11/2017 15:37:53 Belay, SJGGEZMO P. ([id number]) -------------------------------------------------------------------------------- Pain Assessment Details Patient Name: Alexa Lowes P. Date of Service: 06/11/2017 3:30 PM Medical Record Number: [id number] Patient Account Number: [id number] Date of Birth/Sex: [birth year] (83 y.o. Female) Treating RN: Montey Hora Primary Care Jalin Erpelding: Lucianne Lei Other Clinician: Referring Hula Tasso: Lucianne Lei Treating Evyn Putzier/Extender: Melburn Hake, HOYT Weeks in Treatment: 2 Active Problems Location of Pain Severity and Description of Pain Patient Has Paino Yes Site Locations Pain Location: Pain in Ulcers With Dressing Change: Yes Duration of the  Pain. Constant / Intermittento Intermittent Pain Management and Medication Current Pain Management: Notes Topical or injectable lidocaine is offered to patient for acute pain when surgical debridement is performed. If needed, Patient is instructed to use over the counter pain medication for the following 24-48 hours after debridement. Wound care MDs do not prescribed pain medications. Patient has chronic pain or uncontrolled pain. Patient has been instructed to make an appointment with their Primary  Care Physician for pain management. Electronic Signature(s) Signed: 06/11/2017 4:22:27 PM By: Montey Hora Entered By: Montey Hora on 06/11/2017 15:22:59 [name], [name] ([id number]) -------------------------------------------------------------------------------- Patient/Caregiver Education Details Patient Name: Alexa Lowes P. Date of Service: 06/11/2017 3:30 PM Medical Record Number: [id number] Patient Account Number: [id number] Date of Birth/Gender: 01/07/1926 (83 y.o. Female) Treating RN: Montey Hora Primary Care Physician: Lucianne Lei Other Clinician: Referring Physician: Lucianne Lei Treating Physician/Extender: Sharalyn Ink in Treatment: 2 Education Assessment Education Provided To: Patient and Caregiver Education Topics Provided Wound/Skin Impairment: Handouts: Other: wound care as ordered Methods: Demonstration, Explain/Verbal Responses: State content correctly Electronic Signature(s) Signed: 06/11/2017 4:22:27 PM By: Montey Hora Entered By: Montey Hora on 06/11/2017 15:39:36 [name], [name] ([id number]) -------------------------------------------------------------------------------- Wound Assessment Details Patient Name: Alexa Lowes P. Date of Service: 06/11/2017 3:30 PM Medical Record Number: [id number] Patient Account Number: [id number] Date of Birth/Sex: 1926/08/27 (83 y.o. Female) Treating RN: Montey Hora Primary Care Yaquelin Langelier: Lucianne Lei Other Clinician: Referring Chibuikem Thang: Lucianne Lei Treating Keeanna Villafranca/Extender: Melburn Hake, HOYT Weeks in Treatment: 2 Wound Status Wound Number: 1 Primary Trauma, Other Etiology: Wound Location: Left Lower Leg - Anterior Wound Open Wounding Event: Trauma Status: Date Acquired: 05/02/2017 Comorbid Anemia, Hypertension, Type II Weeks Of Treatment: 2 History: Diabetes, Osteoarthritis Clustered Wound: No Photos Photo Uploaded By: Montey Hora on 06/11/2017 16:15:54 Wound  Measurements Length: (cm) 6.6 % Reduction i Width: (cm) 0.8 % Reduction i Depth: (cm) 0.1 Epithelializa Area: (cm) 4.147 Tunneling: Volume: (cm) 0.415 Undermining: n Area: 13.7% n Volume: 56.8% tion: None No No Wound Description Full Thickness Without Foul Odor Aft Classification: Exposed Support Structures Slough/Fibrin Diabetic Severity Grade 1 (Wagner): Wound Margin: Flat and Intact Exudate Amount: Medium Exudate Type: Serous Exudate Color: amber er Cleansing: No o Yes Wound Bed Granulation Amount: Small (1-33%) Exposed Structure Granulation Quality: Pink, Pale Fascia Exposed: No Winborne, Kanai P. ([id number]) Necrotic Amount: Large (67-100%) Fat Layer (Subcutaneous Tissue) Exposed: Yes Necrotic Quality: Adherent Slough Tendon Exposed: No Muscle Exposed: No Joint Exposed: No Bone Exposed: No Periwound Skin Texture Texture Color No Abnormalities Noted: No No Abnormalities Noted: No Callus: No Atrophie Blanche: No Crepitus: No Cyanosis: No Excoriation: No Ecchymosis: No Induration: No Erythema: Yes Rash: No Erythema Location: Circumferential Scarring: No Hemosiderin Staining: No Mottled: No Moisture Pallor: No No Abnormalities Noted: No Rubor: No Dry / Scaly: No Maceration: No Temperature / Pain Temperature: No Abnormality Tenderness on Palpation: Yes Wound Preparation Ulcer Cleansing: Rinsed/Irrigated with Saline Topical Anesthetic Applied: Other: lidocaine 4%, Treatment Notes Wound #1 (Left, Anterior Lower Leg) 1. Cleansed with: Clean wound with Normal Saline 2. Anesthetic Topical Lidocaine 4% cream to wound bed prior to debridement 3. Peri-wound Care: Skin Prep 4. Dressing Applied: Santyl Ointment 5. Secondary Dressing Applied Bordered Foam Dressing Saline moistened guaze Electronic Signature(s) Signed: 06/11/2017 4:22:27 PM By: Montey Hora Entered By: Montey Hora on 06/11/2017 15:28:14 Gallicchio, [name]  ([id number]) -------------------------------------------------------------------------------- Vitals Details Patient Name: Alexa Lowes P. Date of Service: 06/11/2017 3:30 PM Medical Record Number: [id number] Patient Account Number: [id number] Date of Birth/Sex: Apr 24, 1926 (83 y.o. Female)  Treating RN: Montey Hora Primary Care Nakeia Calvi: Lucianne Lei Other Clinician: Referring Eboni Coval: Lucianne Lei Treating Eilah Common/Extender: Melburn Hake, HOYT Weeks in Treatment: 2 Vital Signs Time Taken: 15:23 Temperature (F): 98.4 Height (in): 65 Pulse (bpm): 87 Weight (lbs): 131 Respiratory Rate (breaths/min): 18 Body Mass Index (BMI): 21.8 Blood Pressure (mmHg): 172/62 Reference Range: 80 - 120 mg / dl Electronic Signature(s) Signed: 06/11/2017 4:22:27 PM By: Montey Hora Entered By: Montey Hora on 06/11/2017 15:23:52

## 2017-06-13 NOTE — Progress Notes (Signed)
EDDYE, BROXTERMAN ([id number]) Visit Report for 06/11/2017 Chief Complaint Document Details Patient Name: [name], [name]. Date of Service: 06/11/2017 3:30 PM Medical Record Number: [id number] Patient Account Number: [id number] Date of Birth/Sex: Mar 25, 1926 (83 y.o. Female) Treating RN: Montey Hora Primary Care Provider: Lucianne Lei Other Clinician: Referring Provider: Lucianne Lei Treating Provider/Extender: Melburn Hake, Shannel Zahm Weeks in Treatment: 2 Information Obtained from: Patient Chief Complaint Patients presents for treatment of an open diabetic ulcer 2 the left lower anterior leg which she sustained an injury by blunt trauma Electronic Signature(s) Signed: 06/11/2017 6:13:19 PM By: Worthy Keeler PA-C Entered By: Worthy Keeler on 06/11/2017 15:35:44 [name], [name]. ([id number]) -------------------------------------------------------------------------------- HPI Details Patient Name: Alexa Lowes [name]. Date of Service: 06/11/2017 3:30 PM Medical Record Number: [id number] Patient Account Number: [id number] Date of Birth/Sex: 06/30/1926 (83 y.o. Female) Treating RN: Montey Hora Primary Care Provider: Lucianne Lei Other Clinician: Referring Provider: Lucianne Lei Treating Provider/Extender: Melburn Hake, Lillia Lengel Weeks in Treatment: 2 History of Present Illness Location: left anterior shin Quality: Patient reports experiencing a dull pain to affected area(s). Severity: Patient states wound are getting worse. Duration: Patient has had the wound for < 3 weeks prior to presenting for treatment Timing: Pain in wound is Intermittent (comes and goes Context: The wound occurred when the patient blunt trauma against her left shin Modifying Factors: Other treatment(s) tried include:active Bactroban ointment locally Associated Signs and Symptoms: Patient reports having: occasional discharge from the wound HPI Description: 83 year old patient was seen in the ED about 10 days ago  for a history of abrasion to the left lower extremity while she was boarding a bus and scraped the anterior part of her left leg. She has been a diabetic for many years and has been taking treatment regularly. Her past medical history is also significant for anemia arthritis constipation and hypertension and is status post abdominal hysterectomy. She has never been a smoker. after her ER visit she was advised to apply Bactroban ointment to the wound twice a day and continue to monitor her blood glucose levels. her last hemoglobin A1c was done 3 years ago and was 6.6% 06/04/17 patient left anterior shin wound appears to be doing well although it is still somewhat dry despite the treatment with Medihoney. We have been using Kerlex following the Medihoney application and I believe this is just not retaining that much moisture. Nonetheless there is no evidence of infection. 06/11/17 on evaluation today patient appears to be doing a little bit worse in regard to her wound. The entirety of the wound is dry and unfortunately the Medihoney does not seem to be helping this. That is even with the dressing changes that I made last week to try to retain more moisture. She has not tried [employer] as of yet. She was also noncompressible when testing for ABIs. Electronic Signature(s) Signed: 06/11/2017 6:13:19 PM By: Worthy Keeler PA-C Entered By: Worthy Keeler on 06/11/2017 15:52:21 [name], Alexa [employer] ([id number]) -------------------------------------------------------------------------------- Physical Exam Details Patient Name: Alexa Lowes [name]. Date of Service: 06/11/2017 3:30 PM Medical Record Number: [id number] Patient Account Number: [id number] Date of Birth/Sex: [birth year] (83 y.o. Female) Treating RN: Montey Hora Primary Care Provider: Lucianne Lei Other Clinician: Referring Provider: Lucianne Lei Treating Provider/Extender: STONE III, Lynsay Fesperman Weeks in Treatment: 2 Constitutional Well-nourished  and well-hydrated in no acute distress. Respiratory normal breathing without difficulty. clear to auscultation bilaterally. Psychiatric this patient is able to make decisions and demonstrates good insight into disease process. Alert and Oriented x 3. pleasant  and cooperative. Notes Fortunately patient has no erythema around the wound bed at this point although it does continue to be completing the eschar covered despite initial debridement and attempt at continuing therapy with Medihoney. Electronic Signature(s) Signed: 06/11/2017 6:13:19 PM By: Worthy Keeler PA-C Entered By: Worthy Keeler on 06/11/2017 15:52:44 [name], Alexa [employer] ([id number]) -------------------------------------------------------------------------------- Physician Orders Details Patient Name: Alexa Lowes [name]. Date of Service: 06/11/2017 3:30 PM Medical Record Number: [id number] Patient Account Number: [id number] Date of Birth/Sex: 1926/02/28 (83 y.o. Female) Treating RN: Montey Hora Primary Care Provider: Lucianne Lei Other Clinician: Referring Provider: Lucianne Lei Treating Provider/Extender: Melburn Hake, Kodi Guerrera Weeks in Treatment: 2 Verbal / Phone Orders: No Diagnosis Coding ICD-10 Coding Code Description E11.622 Type 2 diabetes mellitus with other skin ulcer L97.222 Non-pressure chronic ulcer of left calf with fat layer exposed Wound Cleansing Wound #1 Left,Anterior Lower Leg o Clean wound with Normal Saline. o May Shower, gently pat wound dry prior to applying new dressing. Anesthetic Wound #1 Left,Anterior Lower Leg o Topical Lidocaine 4% cream applied to wound bed prior to debridement Primary Wound Dressing Wound #1 Left,Anterior Lower Leg o Santyl Ointment Secondary Dressing Wound #1 Left,Anterior Lower Leg o Saline moistened gauze o Boardered Foam Dressing Dressing Change Frequency Wound #1 Left,Anterior Lower Leg o Change dressing every day. Follow-up Appointments Wound  #1 Left,Anterior Lower Leg o Return Appointment in 1 week. Edema Control Wound #1 Left,Anterior Lower Leg o Elevate legs to the level of the heart and pump ankles as often as possible [name], [name]. ([id number]) Additional Orders / Instructions Wound #1 Left,Anterior Lower Leg o Increase protein intake. o Other: - Please add vitamin A, vitamin C and zinc supplements to your diet Services and Therapies o Arterial Studies- Bilateral - (ICD10 L97.222 - Non-pressure chronic ulcer of left calf with fat layer exposed) Patient Medications Allergies: No Known Drug Allergies Notifications Medication Indication Start End Santyl 06/11/2017 DOSE topical 250 unit/gram ointment - ointment topical applied nickel thick to the wound bed daily then cover with saline gause then boarder foam Notes I am going to recommend that we switch from the Medihoney to Santyl at this point. We will otherwise continue current wound care orders per above. I'm also going to order a bilateral arterial study to further evaluate her blood flow due to the fact that this wound does not appear to be responding appropriately to Current wound care measures. Patient and her granddaughter are in agreement with this. Electronic Signature(s) Signed: 06/11/2017 6:13:19 PM By: Worthy Keeler PA-C Previous Signature: 06/11/2017 3:55:32 PM Version By: Worthy Keeler PA-C Entered By: Worthy Keeler on 06/11/2017 16:32:51 [name], Alexa [employer] ([id number]) -------------------------------------------------------------------------------- Problem List Details Patient Name: Alexa Lowes [name]. Date of Service: 06/11/2017 3:30 PM Medical Record Number: [id number] Patient Account Number: [id number] Date of Birth/Sex: 1926/01/29 (83 y.o. Female) Treating RN: Montey Hora Primary Care Provider: Lucianne Lei Other Clinician: Referring Provider: Lucianne Lei Treating Provider/Extender: Melburn Hake,  Jon Weeks in Treatment:  2 Active Problems ICD-10 Encounter Code Description Active Date Diagnosis E11.622 Type 2 diabetes mellitus with other skin ulcer 05/28/2017 Yes L97.222 Non-pressure chronic ulcer of left calf with fat layer 05/28/2017 Yes exposed Inactive Problems Resolved Problems Electronic Signature(s) Signed: 06/11/2017 6:13:19 PM By: Worthy Keeler PA-C Entered By: Worthy Keeler on 06/11/2017 15:35:34 [name], [name]. ([id number]) -------------------------------------------------------------------------------- Progress Note Details Patient Name: Alexa Lowes [name]. Date of Service: 06/11/2017 3:30 PM Medical Record Number: [id number] Patient Account Number: [id number] Date of Birth/Sex: [birth year] (83  y.o. Female) Treating RN: Montey Hora Primary Care Provider: Lucianne Lei Other Clinician: Referring Provider: Lucianne Lei Treating Provider/Extender: Melburn Hake, Fumiko Cham Weeks in Treatment: 2 Subjective Chief Complaint Information obtained from Patient Patients presents for treatment of an open diabetic ulcer 2 the left lower anterior leg which she sustained an injury by blunt trauma History of Present Illness (HPI) The following HPI elements were documented for the patient's wound: Location: left anterior shin Quality: Patient reports experiencing a dull pain to affected area(s). Severity: Patient states wound are getting worse. Duration: Patient has had the wound for < 3 weeks prior to presenting for treatment Timing: Pain in wound is Intermittent (comes and goes Context: The wound occurred when the patient blunt trauma against her left shin Modifying Factors: Other treatment(s) tried include:active Bactroban ointment locally Associated Signs and Symptoms: Patient reports having: occasional discharge from the wound 83 year old patient was seen in the ED about 10 days ago for a history of abrasion to the left lower extremity while she was boarding a bus and scraped the anterior part of  her left leg. She has been a diabetic for many years and has been taking treatment regularly. Her past medical history is also significant for anemia arthritis constipation and hypertension and is status post abdominal hysterectomy. She has never been a smoker. after her ER visit she was advised to apply Bactroban ointment to the wound twice a day and continue to monitor her blood glucose levels. her last hemoglobin A1c was done 3 years ago and was 6.6% 06/04/17 patient left anterior shin wound appears to be doing well although it is still somewhat dry despite the treatment with Medihoney. We have been using Kerlex following the Medihoney application and I believe this is just not retaining that much moisture. Nonetheless there is no evidence of infection. 06/11/17 on evaluation today patient appears to be doing a little bit worse in regard to her wound. The entirety of the wound is dry and unfortunately the Medihoney does not seem to be helping this. That is even with the dressing changes that I made last week to try to retain more moisture. She has not tried [employer] as of yet. She was also noncompressible when testing for ABIs. [name], [name]. ([id number]) Objective Constitutional Well-nourished and well-hydrated in no acute distress. Vitals Time Taken: 3:23 PM, Height: 65 in, Weight: 131 lbs, BMI: 21.8, Temperature: 98.4 F, Pulse: 87 bpm, Respiratory Rate: 18 breaths/min, Blood Pressure: 172/62 mmHg. Respiratory normal breathing without difficulty. clear to auscultation bilaterally. Psychiatric this patient is able to make decisions and demonstrates good insight into disease process. Alert and Oriented x 3. pleasant and cooperative. General Notes: Fortunately patient has no erythema around the wound bed at this point although it does continue to be completing the eschar covered despite initial debridement and attempt at continuing therapy with Medihoney. Integumentary (Hair,  Skin) Wound #1 status is Open. Original cause of wound was Trauma. The wound is located on the Left,Anterior Lower Leg. The wound measures 6.6cm length x 0.8cm width x 0.1cm depth; 4.147cm^2 area and 0.415cm^3 volume. There is Fat Layer (Subcutaneous Tissue) Exposed exposed. There is no tunneling or undermining noted. There is a medium amount of serous drainage noted. The wound margin is flat and intact. There is small (1-33%) pink, pale granulation within the wound bed. There is a large (67-100%) amount of necrotic tissue within the wound bed including Adherent Slough. The periwound skin appearance exhibited: Erythema. The periwound skin appearance did not exhibit: Callus,  Crepitus, Excoriation, Induration, Rash, Scarring, Dry/Scaly, Maceration, Atrophie Blanche, Cyanosis, Ecchymosis, Hemosiderin Staining, Mottled, Pallor, Rubor. The surrounding wound skin color is noted with erythema which is circumferential. Periwound temperature was noted as No Abnormality. The periwound has tenderness on palpation. Assessment Active Problems ICD-10 E11.622 - Type 2 diabetes mellitus with other skin ulcer L97.222 - Non-pressure chronic ulcer of left calf with fat layer exposed [name], [name]. ([id number]) Plan Wound Cleansing: Wound #1 Left,Anterior Lower Leg: Clean wound with Normal Saline. May Shower, gently pat wound dry prior to applying new dressing. Anesthetic: Wound #1 Left,Anterior Lower Leg: Topical Lidocaine 4% cream applied to wound bed prior to debridement Primary Wound Dressing: Wound #1 Left,Anterior Lower Leg: Santyl Ointment Secondary Dressing: Wound #1 Left,Anterior Lower Leg: Saline moistened gauze Boardered Foam Dressing Dressing Change Frequency: Wound #1 Left,Anterior Lower Leg: Change dressing every day. Follow-up Appointments: Wound #1 Left,Anterior Lower Leg: Return Appointment in 1 week. Edema Control: Wound #1 Left,Anterior Lower Leg: Elevate legs to the  level of the heart and pump ankles as often as possible Additional Orders / Instructions: Wound #1 Left,Anterior Lower Leg: Increase protein intake. Other: - Please add vitamin A, vitamin C and zinc supplements to your diet Services and Therapies ordered were: Arterial Studies- Bilateral The following medication(s) was prescribed: Santyl topical 250 unit/gram ointment ointment topical applied nickel thick to the wound bed daily then cover with saline gause then boarder foam starting 06/11/2017 General Notes: I am going to recommend that we switch from the [place] to Santyl at this point. We will otherwise continue current wound care orders per above. I'm also going to order a bilateral arterial study to further evaluate her blood flow due to the fact that this wound does not appear to be responding appropriately to Current wound care measures. Patient and her granddaughter are in agreement with this. Electronic Signature(s) Signed: 06/11/2017 6:13:19 PM By: Verline [name], [name]. ([id number]) Entered By: Worthy Keeler on 06/11/2017 16:33:09 [name], Alexa [employer] ([id number]) -------------------------------------------------------------------------------- SuperBill Details Patient Name: Alexa Lowes [name]. Date of Service: 06/11/2017 Medical Record Number: [id number] Patient Account Number: [id number] Date of Birth/Sex: 12-27-1925 (83 y.o. Female) Treating RN: Montey Hora Primary Care Provider: Lucianne Lei Other Clinician: Referring Provider: Lucianne Lei Treating Provider/Extender: Melburn Hake, Ithan Touhey Weeks in Treatment: 2 Diagnosis Coding ICD-10 Codes Code Description E11.622 Type 2 diabetes mellitus with other skin ulcer L97.222 Non-pressure chronic ulcer of left calf with fat layer exposed Facility Procedures CPT4 Code: [id number] Description: [place] VISIT-LEV 3 EST PT Modifier: Quantity: 1 Physician Procedures CPT4 Code: [id number] Description:  50354 - [facility] PHYS LEVEL 3 - EST PT ICD-10 Description Diagnosis E11.622 Type 2 diabetes mellitus with other skin ulcer L97.222 Non-pressure chronic ulcer of left calf with fat Modifier: layer exposed Quantity: 1 Electronic Signature(s) Signed: 06/11/2017 6:13:19 PM By: Worthy Keeler PA-C Entered By: Worthy Keeler on 06/11/2017 16:33:28

## 2017-06-18 ENCOUNTER — Encounter: Payer: Medicare Other | Admitting: Surgery

## 2017-06-18 DIAGNOSIS — E11622 Type 2 diabetes mellitus with other skin ulcer: Secondary | ICD-10-CM | POA: Diagnosis not present

## 2017-06-20 NOTE — Progress Notes (Signed)
ADDASYN, MCBREEN ([id number]) Visit Report for 06/18/2017 Arrival Information Details Patient Name: [name], [name] Date of Service: 06/18/2017 2:30 PM Medical Record Number: [id number] Patient Account Number: [id number] Date of Birth/Sex: 03/06/1926 (83 y.o. Female) Treating RN: Cornell Barman Primary Care Timmie Calix: Lucianne Lei Other Clinician: Referring Azlynn Mitnick: Lucianne Lei Treating Danasha Melman/Extender: Frann Rider in Treatment: 3 Visit Information History Since Last Visit Added or deleted any medications: No Patient Arrived: [name] Any new allergies or adverse reactions: No Arrival Time: 14:58 Had a fall or experienced change in No Accompanied By: grandaughtert activities of daily living that may affect Transfer Assistance: None risk of falls: Patient Identification Verified: Yes Signs or symptoms of abuse/neglect since last No Secondary Verification Yes visito Process Completed: Hospitalized since last visit: No Patient Has Alerts: Yes Has Dressing in Place as Prescribed: Yes Patient Alerts: DMII Pain Present Now: No ABI [place] BILATERAL >220 Electronic Signature(s) Signed: 06/18/2017 4:53:37 PM By: Gretta Cool, BSN, RN, CWS, Kim RN, BSN Entered By: Gretta Cool, BSN, RN, CWS, Kim on 06/18/2017 14:59:05 Maheu, [name] ([id number]) -------------------------------------------------------------------------------- Encounter Discharge Information Details Patient Name: Alexa Lowes P. Date of Service: 06/18/2017 2:30 PM Medical Record Number: [id number] Patient Account Number: [id number] Date of Birth/Sex: [birth year] (83 y.o. Female) Treating RN: Cornell Barman Primary Care Chavez Rosol: Lucianne Lei Other Clinician: Referring Sarah-Jane Nazario: Lucianne Lei Treating Deidrick Rainey/Extender: Frann Rider in Treatment: 3 Encounter Discharge Information Items Discharge Pain Level: 1 Discharge Condition: Stable Ambulatory Status: Cane Discharge Destination: Home Transportation: Private  Auto Accompanied By: granddaughter Schedule Follow-up Appointment: Yes Medication Reconciliation completed and provided to Yes Patient/Care Kaya Pottenger: Provided on Clinical Summary of Care: 06/18/2017 Form Type Recipient Paper Patient GW Electronic Signature(s) Signed: 06/18/2017 3:28:25 PM By: Ruthine Dose Entered By: Ruthine Dose on 06/18/2017 15:28:25 Schellhase, [name] ([id number]) -------------------------------------------------------------------------------- Lower Extremity Assessment Details Patient Name: Alexa Lowes P. Date of Service: 06/18/2017 2:30 PM Medical Record Number: [id number] Patient Account Number: [id number] Date of Birth/Sex: 1926/10/30 (83 y.o. Female) Treating RN: Cornell Barman Primary Care Sharifa Bucholz: Lucianne Lei Other Clinician: Referring Jes Costales: Lucianne Lei Treating Ahkeem Goede/Extender: Frann Rider in Treatment: 3 Edema Assessment Assessed: [Left: No] [Right: No] Edema: [Left: Ye] [Right: s] Calf Left: Right: Point of Measurement: 33 cm From Medial Instep 27.8 cm cm Ankle Left: Right: Point of Measurement: 10 cm From Medial Instep 19 cm cm Vascular Assessment Pulses: Dorsalis Pedis Palpable: [Left:Yes] Posterior Tibial Extremity colors, hair growth, and conditions: Extremity Color: [Left:Normal] Hair Growth on Extremity: [Left:No] Temperature of Extremity: [Left:Warm] Capillary Refill: [Left:< 3 seconds] Dependent Rubor: [Left:No] Blanched when Elevated: [Left:No] Lipodermatosclerosis: [Left:No] Toe Nail Assessment Left: Right: Thick: No Discolored: No Deformed: No Improper Length and Hygiene: No Electronic Signature(s) Signed: 06/18/2017 4:53:37 PM By: Gretta Cool, BSN, RN, CWS, Kim RN, BSN Hampshire, Corry P. ([id number]) Entered By: Gretta Cool, BSN, RN, CWS, Kim on 06/18/2017 15:07:33 Mchargue, Jeralyn P. ([id number]) -------------------------------------------------------------------------------- Multi Wound Chart Details Patient  Name: Alexa Lowes P. Date of Service: 06/18/2017 2:30 PM Medical Record Number: [id number] Patient Account Number: [id number] Date of Birth/Sex: 04-Mar-1926 (83 y.o. Female) Treating RN: Cornell Barman Primary Care Camar Guyton: Lucianne Lei Other Clinician: Referring Alyss Granato: Lucianne Lei Treating Donovin Kraemer/Extender: Frann Rider in Treatment: 3 Vital Signs Height(in): 65 Pulse(bpm): 88 Weight(lbs): 131 Blood Pressure 160/55 (mmHg): Body Mass Index(BMI): 22 Temperature(F): 98.0 Respiratory Rate 16 (breaths/min): Photos: [N/A:N/A] Wound Location: Left Lower Leg - Anterior N/A N/A Wounding Event: Trauma N/A N/A Primary Etiology: Trauma, Other N/A N/A Comorbid History: Anemia, Hypertension, N/A N/A Type II Diabetes, Osteoarthritis Date Acquired:  05/02/2017 N/A N/A Weeks of Treatment: 3 N/A N/A Wound Status: Open N/A N/A Measurements L x W x D 6.8x1.2x0.1 N/A N/A (cm) Area (cm) : 6.409 N/A N/A Volume (cm) : 0.641 N/A N/A % Reduction in Area: -33.30% N/A N/A % Reduction in Volume: 33.30% N/A N/A Classification: Full Thickness Without N/A N/A Exposed Support Structures HBO Classification: Grade 1 N/A N/A Exudate Amount: Medium N/A N/A Exudate Type: Serous N/A N/A Exudate Color: amber N/A N/A Wound Margin: Flat and Intact N/A N/A Granulation Amount: None Present (0%) N/A N/A Nuzzo, [name] ([id number]) Necrotic Amount: Large (67-100%) N/A N/A Exposed Structures: Fat Layer (Subcutaneous N/A N/A Tissue) Exposed: Yes Fascia: No Tendon: No Muscle: No Joint: No Bone: No Epithelialization: None N/A N/A Debridement: Debridement (29937- N/A N/A 11047) Pre-procedure 15:10 N/A N/A Verification/Time Out Taken: Pain Control: Other N/A N/A Tissue Debrided: Necrotic/Eschar, N/A N/A Fibrin/Slough, Subcutaneous Level: Skin/Subcutaneous N/A N/A Tissue Debridement Area (sq 8.16 N/A N/A cm): Instrument: Curette N/A N/A Bleeding: Minimum N/A N/A Hemostasis  Achieved: Pressure N/A N/A Procedural Pain: 3 N/A N/A Post Procedural Pain: 1 N/A N/A Debridement Treatment Procedure was tolerated N/A N/A Response: well Post Debridement 6.8x1.2x0.1 N/A N/A Measurements L x W x D (cm) Post Debridement 0.641 N/A N/A Volume: (cm) Periwound Skin Texture: Scarring: Yes N/A N/A Excoriation: No Induration: No Callus: No Crepitus: No Rash: No Periwound Skin Maceration: No N/A N/A Moisture: Dry/Scaly: No Periwound Skin Color: Erythema: Yes N/A N/A Atrophie Blanche: No Cyanosis: No Ecchymosis: No Hemosiderin Staining: No Mottled: No Pallor: No Rubor: No Freilich, Loyda P. ([id number]) Erythema Location: Circumferential N/A N/A Temperature: No Abnormality N/A N/A Tenderness on Yes N/A N/A Palpation: Wound Preparation: Ulcer Cleansing: N/A N/A Rinsed/Irrigated with Saline Topical Anesthetic Applied: Other: lidocaine 4% Procedures Performed: Debridement N/A N/A Treatment Notes Wound #1 (Left, Anterior Lower Leg) 1. Cleansed with: Clean wound with Normal Saline 2. Anesthetic Topical Lidocaine 4% cream to wound bed prior to debridement 4. Dressing Applied: Santyl Ointment 5. Secondary Dressing Applied Bordered Foam Dressing Electronic Signature(s) Signed: 06/18/2017 3:24:36 PM By: Christin Fudge MD, FACS Entered By: Christin Fudge on 06/18/2017 15:24:36 Moseman, [name] ([id number]) -------------------------------------------------------------------------------- [place] Details Patient Name: Alexa Lowes P. Date of Service: 06/18/2017 2:30 PM Medical Record Number: [id number] Patient Account Number: [id number] Date of Birth/Sex: 05/20/1926 (83 y.o. Female) Treating RN: Cornell Barman Primary Care Mairin Lindsley: Lucianne Lei Other Clinician: Referring Latrese Carolan: Lucianne Lei Treating Mackynzie Woolford/Extender: Frann Rider in Treatment: 3 Active Inactive ` Abuse / Safety / Falls / Self Care Management Nursing  Diagnoses: Potential for falls Goals: Patient will remain injury free related to falls Date Initiated: 05/28/2017 Target Resolution Date: 07/23/2017 Goal Status: Active Interventions: Assess fall risk on admission and as needed Notes: ` Orientation to the Wound Care Program Nursing Diagnoses: Knowledge deficit related to the wound healing center program Goals: Patient/caregiver will verbalize understanding of the [place] Program Date Initiated: 05/28/2017 Target Resolution Date: 07/23/2017 Goal Status: Active Interventions: Provide education on orientation to the wound center Notes: ` Wound/Skin Impairment Nursing Diagnoses: Impaired tissue integrity Nierman, Shirline P. ([id number]) Goals: Ulcer/skin breakdown will have a volume reduction of 30% by week 4 Date Initiated: 05/28/2017 Target Resolution Date: 07/23/2017 Goal Status: Active Ulcer/skin breakdown will have a volume reduction of 50% by week 8 Date Initiated: 05/28/2017 Target Resolution Date: 07/23/2017 Goal Status: Active Ulcer/skin breakdown will have a volume reduction of 80% by week 12 Date Initiated: 05/28/2017 Target Resolution Date: 07/23/2017 Goal Status: Active Ulcer/skin breakdown  will heal within 14 weeks Date Initiated: 05/28/2017 Target Resolution Date: 07/23/2017 Goal Status: Active Interventions: Assess patient/caregiver ability to obtain necessary supplies Assess patient/caregiver ability to perform ulcer/skin care regimen upon admission and as needed Assess ulceration(s) every visit Notes: Electronic Signature(s) Signed: 06/18/2017 4:53:37 PM By: Gretta Cool, BSN, RN, CWS, Kim RN, BSN Entered By: Gretta Cool, BSN, RN, CWS, Kim on 06/18/2017 15:09:26 Acri, [name] ([id number]) -------------------------------------------------------------------------------- Pain Assessment Details Patient Name: Alexa Lowes P. Date of Service: 06/18/2017 2:30 PM Medical Record Number: [id number] Patient  Account Number: [id number] Date of Birth/Sex: [birth year] (83 y.o. Female) Treating RN: Cornell Barman Primary Care Carlo Lorson: Lucianne Lei Other Clinician: Referring Tammee Thielke: Lucianne Lei Treating Chantella Creech/Extender: Frann Rider in Treatment: 3 Active Problems Location of Pain Severity and Description of Pain Patient Has Paino No Site Locations With Dressing Change: No Pain Management and Medication Current Pain Management: Goals for Pain Management Topical or injectable lidocaine is offered to patient for acute pain when surgical debridement is performed. If needed, Patient is instructed to use over the counter pain medication for the following 24-48 hours after debridement. Wound care MDs do not prescribed pain medications. Patient has chronic pain or uncontrolled pain. Patient has been instructed to make an appointment with their Primary Care Physician for pain management. Electronic Signature(s) Signed: 06/18/2017 4:53:37 PM By: Gretta Cool, BSN, RN, CWS, Kim RN, BSN Entered By: Gretta Cool, BSN, RN, CWS, Kim on 06/18/2017 14:59:38 Kerri Perches [name] ([id number]) -------------------------------------------------------------------------------- Patient/Caregiver Education Details Patient Name: Alexa Lowes P. Date of Service: 06/18/2017 2:30 PM Medical Record Number: [id number] Patient Account Number: [id number] Date of Birth/Gender: 1926/06/13 (83 y.o. Female) Treating RN: Cornell Barman Primary Care Physician: Lucianne Lei Other Clinician: Referring Physician: Lucianne Lei Treating Physician/Extender: Frann Rider in Treatment: 3 Education Assessment Education Provided To: Patient Education Topics Provided Wound/Skin Impairment: Handouts: Caring for Your Ulcer Methods: Demonstration Responses: State content correctly Electronic Signature(s) Signed: 06/18/2017 4:53:37 PM By: Gretta Cool, BSN, RN, CWS, Kim RN, BSN Entered By: Gretta Cool, BSN, RN, CWS, Kim on 06/18/2017 15:20:36 Chait,  [name] ([id number]) -------------------------------------------------------------------------------- Wound Assessment Details Patient Name: Alexa Lowes P. Date of Service: 06/18/2017 2:30 PM Medical Record Number: [id number] Patient Account Number: [id number] Date of Birth/Sex: 01/23/1926 (83 y.o. Female) Treating RN: Cornell Barman Primary Care Ambree Frances: Lucianne Lei Other Clinician: Referring Jaziya Obarr: Lucianne Lei Treating Avonne Berkery/Extender: Frann Rider in Treatment: 3 Wound Status Wound Number: 1 Primary Trauma, Other Etiology: Wound Location: Left Lower Leg - Anterior Wound Open Wounding Event: Trauma Status: Date Acquired: 05/02/2017 Comorbid Anemia, Hypertension, Type II Weeks Of Treatment: 3 History: Diabetes, Osteoarthritis Clustered Wound: No Photos Wound Measurements Length: (cm) 6.8 % Reduction in Width: (cm) 1.2 % Reduction in Depth: (cm) 0.1 Epithelializat Area: (cm) 6.409 Tunneling: Volume: (cm) 0.641 Undermining: Area: -33.3% Volume: 33.3% ion: None No No Wound Description Full Thickness Without Foul Odor Afte Classification: Exposed Support Structures Slough/Fibrino Diabetic Severity Grade 1 (Wagner): Wound Margin: Flat and Intact Exudate Amount: Medium Exudate Type: Serous Exudate Color: amber r Cleansing: No Yes Wound Bed Granulation Amount: None Present (0%) Exposed Structure Necrotic Amount: Large (67-100%) Fascia Exposed: No Necrotic Quality: Adherent Slough Fat Layer (Subcutaneous Tissue) Exposed: Yes Tendon Exposed: No Muscle Exposed: No Willard, Anhelica P. ([id number]) Joint Exposed: No Bone Exposed: No Periwound Skin Texture Texture Color No Abnormalities Noted: No No Abnormalities Noted: No Callus: No Atrophie Blanche: No Crepitus: No Cyanosis: No Excoriation: No Ecchymosis: No Induration: No Erythema: Yes Rash: No Erythema Location: Circumferential Scarring: Yes Hemosiderin Staining: No Mottled:  No Moisture Pallor: No No Abnormalities Noted: No Rubor: No Dry / Scaly: No Maceration: No Temperature / Pain Temperature: No Abnormality Tenderness on Palpation: Yes Wound Preparation Ulcer Cleansing: Rinsed/Irrigated with Saline Topical Anesthetic Applied: Other: lidocaine 4%, Treatment Notes Wound #1 (Left, Anterior Lower Leg) 1. Cleansed with: Clean wound with Normal Saline 2. Anesthetic Topical Lidocaine 4% cream to wound bed prior to debridement 4. Dressing Applied: Santyl Ointment 5. Secondary Dressing Applied Bordered Foam Dressing Electronic Signature(s) Signed: 06/18/2017 4:53:37 PM By: Gretta Cool, BSN, RN, CWS, Kim RN, BSN Entered By: Gretta Cool, BSN, RN, CWS, Kim on 06/18/2017 15:03:36 Kerri Perches, [name] ([id number]) -------------------------------------------------------------------------------- Vit[place] Details Patient Name: Alexa Lowes P. Date of Service: 06/18/2017 2:30 PM Medical Record Number: [id number] Patient Account Number: [id number] Date of Birth/Sex: 10-22-1926 (83 y.o. Female) Treating RN: Cornell Barman Primary Care Roselani Grajeda: Lucianne Lei Other Clinician: Referring Darril Patriarca: Lucianne Lei Treating Gabrial Poppell/Extender: Frann Rider in Treatment: 3 Vital Signs Time Taken: 14:59 Temperature (F): 98.0 Height (in): 65 Pulse (bpm): 88 Weight (lbs): 131 Respiratory Rate (breaths/min): 16 Body Mass Index (BMI): 21.8 Blood Pressure (mmHg): 160/55 Reference Range: 80 - 120 mg / dl Electronic Signature(s) Signed: 06/18/2017 4:53:37 PM By: Gretta Cool, BSN, RN, CWS, Kim RN, BSN Entered By: Gretta Cool, BSN, RN, CWS, Kim on 06/18/2017 14:59:58

## 2017-06-20 NOTE — Progress Notes (Signed)
[name], [name] ([id number]) Visit Report for 06/18/2017 Chief Complaint Document Details Patient Name: [name], [name] Date of Service: 06/18/2017 2:30 PM Medical Record Number: [id number] Patient Account Number: [id number] Date of Birth/Sex: [birth year] (83 y.o. Female) Treating RN: Cornell Barman Primary Care Provider: Lucianne Lei Other Clinician: Referring Provider: Lucianne Lei Treating Provider/Extender: Frann Rider in Treatment: 3 Information Obtained from: Patient Chief Complaint Patients presents for treatment of an open diabetic ulcer 2 the left lower anterior leg which she sustained an injury by blunt trauma Electronic Signature(s) Signed: 06/18/2017 3:24:51 PM By: Christin Fudge MD, FACS Entered By: Christin Fudge on 06/18/2017 15:24:50 [name], [name] ([id number]) -------------------------------------------------------------------------------- Debridement Details Patient Name: Alexa Lowes P. Date of Service: 06/18/2017 2:30 PM Medical Record Number: [id number] Patient Account Number: [id number] Date of Birth/Sex: 07-11-1926 (83 y.o. Female) Treating RN: Cornell Barman Primary Care Provider: Lucianne Lei Other Clinician: Referring Provider: Lucianne Lei Treating Provider/Extender: Frann Rider in Treatment: 3 Debridement Performed for Wound #1 Left,Anterior Lower Leg Assessment: Performed By: Physician Christin Fudge, MD Debridement: Debridement Pre-procedure Verification/Time Out Yes - 15:10 Taken: Start Time: 15:11 Pain Control: Other : lidocaine 4% Level: Skin/Subcutaneous Tissue Total Area Debrided (L x 6.8 (cm) x 1.2 (cm) = 8.16 (cm) W): Tissue and other Viable, Non-Viable, Eschar, Fibrin/Slough, Subcutaneous material debrided: Instrument: Curette Bleeding: Minimum Hemostasis Achieved: Pressure End Time: 15:14 Procedural Pain: 3 Post Procedural Pain: 1 Response to Treatment: Procedure was tolerated well Post Debridement  Measurements of Total Wound Length: (cm) 6.8 Width: (cm) 1.2 Depth: (cm) 0.1 Volume: (cm) 0.641 Character of Wound/Ulcer Post Requires Further Debridement Debridement: Post Procedure Diagnosis Same as Pre-procedure Electronic Signature(s) Signed: 06/18/2017 3:24:43 PM By: Christin Fudge MD, FACS Signed: 06/18/2017 4:53:37 PM By: Gretta Cool, BSN, RN, CWS, Kim RN, BSN Entered By: Christin Fudge on 06/18/2017 15:24:43 [name], [name] ([id number]) -------------------------------------------------------------------------------- HPI Details Patient Name: Alexa Lowes P. Date of Service: 06/18/2017 2:30 PM Medical Record Number: [id number] Patient Account Number: [id number] Date of Birth/Sex: 1926-09-18 (83 y.o. Female) Treating RN: Cornell Barman Primary Care Provider: Lucianne Lei Other Clinician: Referring Provider: Lucianne Lei Treating Provider/Extender: Frann Rider in Treatment: 3 History of Present Illness Location: left anterior shin Quality: Patient reports experiencing a dull pain to affected area(s). Severity: Patient states wound are getting worse. Duration: Patient has had the wound for < 3 weeks prior to presenting for treatment Timing: Pain in wound is Intermittent (comes and goes Context: The wound occurred when the patient blunt trauma against her left shin Modifying Factors: Other treatment(s) tried include:active Bactroban ointment locally Associated Signs and Symptoms: Patient reports having: occasional discharge from the wound HPI Description: 83 year old patient was seen in the ED about 10 days ago for a history of abrasion to the left lower extremity while she was boarding a bus and scraped the anterior part of her left leg. She has been a diabetic for many years and has been taking treatment regularly. Her past medical history is also significant for anemia arthritis constipation and hypertension and is status post abdominal hysterectomy. She has never been  a smoker. after her ER visit she was advised to apply Bactroban ointment to the wound twice a day and continue to monitor her blood glucose levels. her last hemoglobin A1c was done 3 years ago and was 6.6% 06/04/17 patient left anterior shin wound appears to be doing well although it is still somewhat dry despite the treatment with Medihoney. We have been using Kerlex following the Wallace application and I believe this  is just not retaining that much moisture. Nonetheless there is no evidence of infection. 06/11/17 on evaluation today patient appears to be doing a little bit worse in regard to her wound. The entirety of the wound is dry and unfortunately the Medihoney does not seem to be helping this. That is even with the dressing changes that I made last week to try to retain more moisture. She has not tried [employer] as of yet. She was also noncompressible when testing for ABIs. Electronic Signature(s) Signed: 06/18/2017 3:24:58 PM By: Christin Fudge MD, FACS Entered By: Christin Fudge on 06/18/2017 15:24:58 [name] ([id number]) -------------------------------------------------------------------------------- Physical Exam Details Patient Name: Alexa Lowes P. Date of Service: 06/18/2017 2:30 PM Medical Record Number: [id number] Patient Account Number: [id number] Date of Birth/Sex: 29-Feb-1926 (83 y.o. Female) Treating RN: Cornell Barman Primary Care Provider: Lucianne Lei Other Clinician: Referring Provider: Lucianne Lei Treating Provider/Extender: Frann Rider in Treatment: 3 Constitutional . Pulse regular. Respirations normal and unlabored. Afebrile. . Eyes Nonicteric. Reactive to light. Ears, Nose, Mouth, and Throat Lips, teeth, and gums WNL.Marland Kitchen Moist mucosa without lesions. Neck supple and nontender. No palpable supraclavicular or cervical adenopathy. Normal sized without goiter. Respiratory WNL. No retractions.. Breath sounds WNL, No rubs, rales, rhonchi, or  wheeze.. Cardiovascular Heart rhythm and rate regular, no murmur or gallop.. Pedal Pulses WNL. No clubbing, cyanosis or edema. Chest Breasts symmetical and no nipple discharge.. Breast tissue WNL, no masses, lumps, or tenderness.. Lymphatic No adneopathy. No adenopathy. No adenopathy. Musculoskeletal Adexa without tenderness or enlargement.. Digits and nails w/o clubbing, cyanosis, infection, petechiae, ischemia, or inflammatory conditions.. Integumentary (Hair, Skin) No suspicious lesions. No crepitus or fluctuance. No peri-wound warmth or erythema. No masses.Marland Kitchen Psychiatric Judgement and insight Intact.. No evidence of depression, anxiety, or agitation.. Notes the linear wounds have a lot of slough and using a #3 curet I have sharply removed as much as she could tolerate and made crosshatches in the wound to help with Santyl debridement Electronic Signature(s) Signed: 06/18/2017 3:25:27 PM By: Christin Fudge MD, FACS Entered By: Christin Fudge on 06/18/2017 15:25:27 [name] ([id number]) -------------------------------------------------------------------------------- Physician Orders Details Patient Name: Alexa Lowes P. Date of Service: 06/18/2017 2:30 PM Medical Record Number: [id number] Patient Account Number: [id number] Date of Birth/Sex: 1926/05/19 (83 y.o. Female) Treating RN: Cornell Barman Primary Care Provider: Lucianne Lei Other Clinician: Referring Provider: Lucianne Lei Treating Provider/Extender: Frann Rider in Treatment: 3 Verbal / Phone Orders: No Diagnosis Coding Wound Cleansing Wound #1 Left,Anterior Lower Leg o Clean wound with Normal Saline. o May Shower, gently pat wound dry prior to applying new dressing. Anesthetic Wound #1 Left,Anterior Lower Leg o Topical Lidocaine 4% cream applied to wound bed prior to debridement Primary Wound Dressing Wound #1 Left,Anterior Lower Leg o Santyl Ointment Secondary Dressing Wound #1  Left,Anterior Lower Leg o Boardered Foam Dressing Dressing Change Frequency Wound #1 Left,Anterior Lower Leg o Change dressing every day. Follow-up Appointments Wound #1 Left,Anterior Lower Leg o Return Appointment in 1 week. Edema Control Wound #1 Left,Anterior Lower Leg o Elevate legs to the level of the heart and pump ankles as often as possible Additional Orders / Instructions Wound #1 Left,Anterior Lower Leg o Increase protein intake. o Other: - Please add vitamin A, vitamin C and zinc supplements to your diet [name], [name] ([id number]) Electronic Signature(s) Signed: 06/18/2017 4:13:09 PM By: Christin Fudge MD, FACS Signed: 06/18/2017 4:53:37 PM By: Gretta Cool, BSN, RN, CWS, Kim RN, BSN Entered By: Gretta Cool, BSN, RN, CWS, Kim on 06/18/2017 15:16:29  [name], [name] ([id number]) -------------------------------------------------------------------------------- Problem List Details Patient Name: OPHIE, BURROWES P. Date of Service: 06/18/2017 2:30 PM Medical Record Number: [id number] Patient Account Number: [id number] Date of Birth/Sex: [birth year] (83 y.o. Female) Treating RN: Cornell Barman Primary Care Provider: Lucianne Lei Other Clinician: Referring Provider: Lucianne Lei Treating Provider/Extender: Frann Rider in Treatment: 3 Active Problems ICD-10 Encounter Code Description Active Date Diagnosis E11.622 Type 2 diabetes mellitus with other skin ulcer 05/28/2017 Yes L97.222 Non-pressure chronic ulcer of left calf with fat layer 05/28/2017 Yes exposed Inactive Problems Resolved Problems Electronic Signature(s) Signed: 06/18/2017 3:24:31 PM By: Christin Fudge MD, FACS Entered By: Christin Fudge on 06/18/2017 15:24:31 [name], [name] ([id number]) -------------------------------------------------------------------------------- Progress Note Details Patient Name: Alexa Lowes P. Date of Service: 06/18/2017 2:30 PM Medical Record Number:  [id number] Patient Account Number: [id number] Date of Birth/Sex: 20-Jan-1926 (83 y.o. Female) Treating RN: Cornell Barman Primary Care Provider: Lucianne Lei Other Clinician: Referring Provider: Lucianne Lei Treating Provider/Extender: Frann Rider in Treatment: 3 Subjective Chief Complaint Information obtained from Patient Patients presents for treatment of an open diabetic ulcer 2 the left lower anterior leg which she sustained an injury by blunt trauma History of Present Illness (HPI) The following HPI elements were documented for the patient's wound: Location: left anterior shin Quality: Patient reports experiencing a dull pain to affected area(s). Severity: Patient states wound are getting worse. Duration: Patient has had the wound for < 3 weeks prior to presenting for treatment Timing: Pain in wound is Intermittent (comes and goes Context: The wound occurred when the patient blunt trauma against her left shin Modifying Factors: Other treatment(s) tried include:active Bactroban ointment locally Associated Signs and Symptoms: Patient reports having: occasional discharge from the wound 84 year old patient was seen in the ED about 10 days ago for a history of abrasion to the left lower extremity while she was boarding a bus and scraped the anterior part of her left leg. She has been a diabetic for many years and has been taking treatment regularly. Her past medical history is also significant for anemia arthritis constipation and hypertension and is status post abdominal hysterectomy. She has never been a smoker. after her ER visit she was advised to apply Bactroban ointment to the wound twice a day and continue to monitor her blood glucose levels. her last hemoglobin A1c was done 3 years ago and was 6.6% 06/04/17 patient left anterior shin wound appears to be doing well although it is still somewhat dry despite the treatment with Medihoney. We have been using Kerlex following the  Medihoney application and I believe this is just not retaining that much moisture. Nonetheless there is no evidence of infection. 06/11/17 on evaluation today patient appears to be doing a little bit worse in regard to her wound. The entirety of the wound is dry and unfortunately the Medihoney does not seem to be helping this. That is even with the dressing changes that I made last week to try to retain more moisture. She has not tried [employer] as of yet. She was also noncompressible when testing for ABIs. Luevanos, Ayris P. ([id number]) Objective Constitutional Pulse regular. Respirations normal and unlabored. Afebrile. Vitals Time Taken: 2:59 PM, Height: 65 in, Weight: 131 lbs, BMI: 21.8, Temperature: 98.0 F, Pulse: 88 bpm, Respiratory Rate: 16 breaths/min, Blood Pressure: 160/55 mmHg. Eyes Nonicteric. Reactive to light. Ears, Nose, Mouth, and Throat Lips, teeth, and gums WNL.Marland Kitchen Moist mucosa without lesions. Neck supple and nontender. No palpable supraclavicular or cervical adenopathy. Normal sized without goiter. Respiratory  WNL. No retractions.. Breath sounds WNL, No rubs, rales, rhonchi, or wheeze.. Cardiovascular Heart rhythm and rate regular, no murmur or gallop.. Pedal Pulses WNL. No clubbing, cyanosis or edema. Chest Breasts symmetical and no nipple discharge.. Breast tissue WNL, no masses, lumps, or tenderness.. Lymphatic No adneopathy. No adenopathy. No adenopathy. Musculoskeletal Adexa without tenderness or enlargement.. Digits and nails w/o clubbing, cyanosis, infection, petechiae, ischemia, or inflammatory conditions.Marland Kitchen Psychiatric Judgement and insight Intact.. No evidence of depression, anxiety, or agitation.. General Notes: the linear wounds have a lot of slough and using a #3 curet I have sharply removed as much as she could tolerate and made crosshatches in the wound to help with Santyl debridement Integumentary (Hair, Skin) No suspicious lesions. No crepitus or  fluctuance. No peri-wound warmth or erythema. No masses.. Wound #1 status is Open. Original cause of wound was Trauma. The wound is located on the Left,Anterior Lower Leg. The wound measures 6.8cm length x 1.2cm width x 0.1cm depth; 6.409cm^2 area and 0.641cm^3 volume. There is Fat Layer (Subcutaneous Tissue) Exposed exposed. There is no tunneling or [name], [name] ([id number]) undermining noted. There is a medium amount of serous drainage noted. The wound margin is flat and intact. There is no granulation within the wound bed. There is a large (67-100%) amount of necrotic tissue within the wound bed including Adherent Slough. The periwound skin appearance exhibited: Scarring, Erythema. The periwound skin appearance did not exhibit: Callus, Crepitus, Excoriation, Induration, Rash, Dry/Scaly, Maceration, Atrophie Blanche, Cyanosis, Ecchymosis, Hemosiderin Staining, Mottled, Pallor, Rubor. The surrounding wound skin color is noted with erythema which is circumferential. Periwound temperature was noted as No Abnormality. The periwound has tenderness on palpation. 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 There was a Skin/Subcutaneous Tissue Debridement ([id number]) debridement with total area of 8.16 sq cm performed by Christin Fudge, MD. with the following instrument(s): Curette to remove Viable and Non-Viable tissue/material including Fibrin/Slough, Eschar, and Subcutaneous after achieving pain control using Other (lidocaine 4%). A time out was conducted at 15:10, prior to the start of the procedure. A Minimum amount of bleeding was controlled with Pressure. The procedure was tolerated well with a pain level of 3 throughout and a pain level of 1  following the procedure. Post Debridement Measurements: 6.8cm length x 1.2cm width x 0.1cm depth; 0.641cm^3 volume. Character of Wound/Ulcer Post Debridement requires further debridement. Post procedure Diagnosis Wound #1: Same as Pre-Procedure Plan Wound Cleansing: Wound #1 Left,Anterior Lower Leg: Clean wound with Normal Saline. May Shower, gently pat wound dry prior to applying new dressing. [name], [name] ([id number]) Anesthetic: Wound #1 Left,Anterior Lower Leg: Topical Lidocaine 4% cream applied to wound bed prior to debridement Primary Wound Dressing: Wound #1 Left,Anterior Lower Leg: Santyl Ointment Secondary Dressing: Wound #1 Left,Anterior Lower Leg: Boardered Foam Dressing Dressing Change Frequency: Wound #1 Left,Anterior Lower Leg: Change dressing every day. Follow-up Appointments: Wound #1 Left,Anterior Lower Leg: Return Appointment in 1 week. Edema Control: Wound #1 Left,Anterior Lower Leg: Elevate legs to the level of the heart and pump ankles as often as possible Additional Orders / Instructions: Wound #1 Left,Anterior Lower Leg: Increase protein intake. Other: - Please add vitamin A, vitamin C and zinc supplements to your diet after review today I have recommended: 1. Daily washing with soap and water and  application of Santyl ointment locally with a light Kerlix dressing. 2. good control of her diabetes mellitus 3. Adequate protein, vitamin A, vitamin C and zinc 4. Regular visits the wound center Electronic Signature(s) Signed: 06/18/2017 3:27:33 PM By: Christin Fudge MD, FACS Entered By: Christin Fudge on 06/18/2017 15:27:33 [name], [name] ([id number]) -------------------------------------------------------------------------------- SuperBill Details Patient Name: Alexa Lowes P. Date of Service: 06/18/2017 Medical Record Number: [id number] Patient Account Number: [id number] Date of Birth/Sex: [birth year] (83 y.o. Female) Treating RN: Cornell Barman Primary Care Provider: Lucianne Lei Other Clinician: Referring Provider: Lucianne Lei Treating Provider/Extender: Frann Rider in Treatment: 3 Diagnosis Coding ICD-10 Codes Code Description E11.622 Type 2 diabetes mellitus with other skin ulcer L97.222 Non-pressure chronic ulcer of left calf with fat layer exposed Facility Procedures CPT4 Code: 98264158 Description: 30940 - DEB SUBQ TISSUE 20 SQ CM/< ICD-10 Description Diagnosis E11.622 Type 2 diabetes mellitus with other skin ulcer L97.222 Non-pressure chronic ulcer of left calf with fat l Modifier: ayer exposed Quantity: 1 Physician Procedures CPT4 Code: 7680881 Description: 10315 - WC PHYS SUBQ TISS 20 SQ CM ICD-10 Description Diagnosis E11.622 Type 2 diabetes mellitus with other skin ulcer L97.222 Non-pressure chronic ulcer of left calf with fat l Modifier: ayer exposed Quantity: 1 Electronic Signature(s) Signed: 06/18/2017 3:27:47 PM By: Christin Fudge MD, FACS Entered By: Christin Fudge on 06/18/2017 15:27:47

## 2017-06-23 ENCOUNTER — Other Ambulatory Visit: Payer: Self-pay | Admitting: Physician Assistant

## 2017-06-23 ENCOUNTER — Ambulatory Visit (INDEPENDENT_AMBULATORY_CARE_PROVIDER_SITE_OTHER): Payer: Medicare Other | Admitting: Podiatry

## 2017-06-23 ENCOUNTER — Encounter: Payer: Self-pay | Admitting: Podiatry

## 2017-06-23 DIAGNOSIS — B351 Tinea unguium: Secondary | ICD-10-CM | POA: Diagnosis not present

## 2017-06-23 DIAGNOSIS — L84 Corns and callosities: Secondary | ICD-10-CM | POA: Diagnosis not present

## 2017-06-23 DIAGNOSIS — E1151 Type 2 diabetes mellitus with diabetic peripheral angiopathy without gangrene: Secondary | ICD-10-CM

## 2017-06-23 DIAGNOSIS — M79676 Pain in unspecified toe(s): Secondary | ICD-10-CM | POA: Diagnosis not present

## 2017-06-23 DIAGNOSIS — L97929 Non-pressure chronic ulcer of unspecified part of left lower leg with unspecified severity: Secondary | ICD-10-CM

## 2017-06-23 DIAGNOSIS — E1142 Type 2 diabetes mellitus with diabetic polyneuropathy: Secondary | ICD-10-CM | POA: Diagnosis not present

## 2017-06-23 NOTE — Patient Instructions (Signed)

## 2017-06-23 NOTE — Progress Notes (Signed)
Patient ID: [name], female   DOB: 09-15-1926, 83 y.o.   MRN: [id number]     Subjective: This patient presents again for schedule visit and is complaining that her toenails are uncomfortable when Walking and wearing shoes and requests toenail debridement. Also, patient is complaining of uncomfortable keratoses on the right and left feet  Objective: Orientated 3 Trace pedal pulses bilaterally Reflex immediate bilaterally Sensation to 10 g monofilament wire intact 1/5 right 2/5 left Vibratory sensation reactive bilaterally Ankle reflex reactive bilaterally No open skin lesions bilaterally Atrophic skin with absent hair growth bilaterally Corns on first and second toes bilaterally and fifth right toe and plantar second MPJ left toenails are elongated, brittle, deformed, hypertrophic and tender direct palpation 6-10 HAV bilaterally Hammertoe fifth bilaterally Manual motor testing dorsi flexion, plantar flexion 5/5 bilaterally  Assessment: Symptomatic onychomycoses 6-10 Keratoses 4  Type II diabetic with peripheral arterial disease Diabetic peripheral neuropathy  Plan: Debridement toenails 6-10 mechanically left without any bleeding Debrided keratoses 4 without any bleeding  Reappoint 3 months

## 2017-06-24 ENCOUNTER — Encounter: Payer: Medicare Other | Admitting: Surgery

## 2017-06-24 DIAGNOSIS — E11622 Type 2 diabetes mellitus with other skin ulcer: Secondary | ICD-10-CM | POA: Diagnosis not present

## 2017-06-25 ENCOUNTER — Encounter (INDEPENDENT_AMBULATORY_CARE_PROVIDER_SITE_OTHER): Payer: Self-pay | Admitting: Vascular Surgery

## 2017-06-25 ENCOUNTER — Ambulatory Visit (INDEPENDENT_AMBULATORY_CARE_PROVIDER_SITE_OTHER): Payer: Medicare Other | Admitting: Vascular Surgery

## 2017-06-25 ENCOUNTER — Ambulatory Visit (INDEPENDENT_AMBULATORY_CARE_PROVIDER_SITE_OTHER): Payer: Medicare Other

## 2017-06-25 VITALS — BP 171/81 | HR 84 | Resp 15 | Ht 65.5 in | Wt 128.0 lb

## 2017-06-25 DIAGNOSIS — E119 Type 2 diabetes mellitus without complications: Secondary | ICD-10-CM

## 2017-06-25 DIAGNOSIS — I7025 Atherosclerosis of native arteries of other extremities with ulceration: Secondary | ICD-10-CM

## 2017-06-25 DIAGNOSIS — L97929 Non-pressure chronic ulcer of unspecified part of left lower leg with unspecified severity: Secondary | ICD-10-CM

## 2017-06-25 DIAGNOSIS — E785 Hyperlipidemia, unspecified: Secondary | ICD-10-CM

## 2017-06-25 DIAGNOSIS — I10 Essential (primary) hypertension: Secondary | ICD-10-CM | POA: Diagnosis not present

## 2017-06-25 NOTE — Assessment & Plan Note (Signed)
blood glucose control important in reducing the progression of atherosclerotic disease. Also, involved in wound healing. On appropriate medications.  

## 2017-06-25 NOTE — Assessment & Plan Note (Signed)
lipid control important in reducing the progression of atherosclerotic disease. Continue statin therapy  

## 2017-06-25 NOTE — Patient Instructions (Signed)

## 2017-06-25 NOTE — Assessment & Plan Note (Signed)
Her noninvasive studies today show noncompressible vessels on the right but brisk waveforms and digital pressure of 71 on the right consistent with only mild arterial insufficiency. On the left, her ABI 0.47 and this may be falsely elevated from calcification. No digital pressure was unobtainable on the left. This is consistent with severe arterial insufficiency. This represents a critical limb threatening situation. I then had a long discussion with the patient today regarding the scenario as well as her granddaughter. Although the ulcers are in the calf and not the foot, her arterial perfusion is markedly reduced on the left leg and will make wound healing difficult. This does represent a serious situation and given her marked arterial insufficiency, I think angiography with possible revascularization should be performed. I discussed the risks and benefits of this procedure. The patient desires to discuss this with more family members as well as her wound care physician next week. She will contact our office if she agrees to schedule and have this done.

## 2017-06-25 NOTE — Progress Notes (Signed)
Patient ID: [name], female   DOB: [birth year], 83 y.o.   MRN: [id number]  Chief Complaint  Patient presents with  . New Evaluation    Ulcer of left leg    HPI [name] is a 83 y.o. female.  I am asked to see the patient by [name] at the [facility] for evaluation of PAD and non-healing ulcerations.  The patient reports minor trauma to the area about 6-8 weeks ago. Despite appropriate local wound care, the wound has really only minimally progressed according to the wound care center. The patient denies significant [name]. She has no fever, chills, or signs of infections. She says she walks reasonably well without limitations. She has long-standing diabetes, hypertension, and dyslipidemia. She reports no ischemic rest [name] symptoms. Her noninvasive studies today show noncompressible vessels on the right but brisk waveforms and digital pressure of 71 on the right consistent with only mild arterial insufficiency. On the left, her ABI 0.47 and this may be falsely elevated from calcification. No digital pressure was unobtainable on the left. This is consistent with severe arterial insufficiency.   Past Medical History:  Diagnosis Date  . Anemia   . Arthritis   . Constipation   . Diabetes mellitus without complication ([place])   . Dyslipidemia   . Hypertension     Past Surgical History:  Procedure Laterality Date  . ABDOMINAL HYSTERECTOMY    . MULTIPLE TOOTH EXTRACTIONS      Family History No bleeding disorders, clotting disorders, autoimmune diseases, or aneurysms  Social History Social History  Substance Use Topics  . Smoking status: Never Smoker  . Smokeless tobacco: Never Used  . Alcohol use No  No IVDU  No Known Allergies  Current Outpatient Prescriptions  Medication Sig Dispense Refill  . amLODipine (NORVASC) 10 MG tablet Take 10 mg by mouth daily.    Marland Kitchen atorvastatin (LIPITOR) 10 MG tablet Take 10 mg by mouth daily.    . Calcium Carbonate  (CALCIUM 500 PO) Take by mouth.    . carvedilol (COREG) 6.25 MG tablet Take 6.25 mg by mouth at bedtime.    . Cholecalciferol (VITAMIN D-3 PO) Take 1,000 Units by mouth daily.     . ciprofloxacin (CIPRO) 250 MG tablet Take 1 tablet (250 mg total) by mouth every 12 (twelve) hours. 10 tablet 0  . clopidogrel (PLAVIX) 75 MG tablet Take 1 tablet (75 mg total) by mouth daily with breakfast. 30 tablet 1  . glimepiride (AMARYL) 4 MG tablet Take 4 mg by mouth daily with breakfast.    . ipratropium (ATROVENT) 0.06 % nasal spray Place 1 spray into both nostrils 4 (four) times daily. 15 mL 1  . metFORMIN (GLUCOPHAGE) 1000 MG tablet Take 1,000 mg by mouth 2 (two) times daily with a meal.    . mupirocin ointment (BACTROBAN) 2 % Apply 1 application topically 2 (two) times daily. 22 g 0  . ONE TOUCH ULTRA TEST test strip CHECK GLUCOSE ONCE D  1  . pantoprazole (PROTONIX) 20 MG tablet Take 1 tablet (20 mg total) by mouth daily. 30 tablet 1  . polyethylene glycol (MIRALAX / GLYCOLAX) packet Take 17 g by mouth daily.    Marland Kitchen SANTYL ointment   0  . valsartan-hydrochlorothiazide (DIOVAN-HCT) 320-25 MG per tablet Take 1 tablet by mouth daily.     No current facility-administered medications for this visit.       REVIEW OF SYSTEMS (Negative unless checked)  Constitutional: [] Weight loss  []   Fever  [] Chills Cardiac: [] Chest [name]   [] Chest pressure   [] Palpitations   [] Shortness of breath when laying flat   [] Shortness of breath at rest   [] Shortness of breath with exertion. Vascular:  [] [name] in legs with walking   [] [name] in legs at rest   [] [name] in legs when laying flat   [] Claudication   [] [name] in feet when walking  [] [name] in feet at rest  [] [name] in feet when laying flat   [] History of DVT   [] Phlebitis   [] Swelling in legs   [] Varicose veins   [x] Non-healing ulcers Pulmonary:   [] Uses home oxygen   [] Productive cough   [] Hemoptysis   [] Wheeze  [] COPD   [] Asthma Neurologic:  [] Dizziness  [] Blackouts   [] Seizures    [] History of stroke   [] History of TIA  [] Aphasia   [] Temporary blindness   [] Dysphagia   [] Weakness or numbness in arms   [] Weakness or numbness in legs Musculoskeletal:  [x] Arthritis   [] Joint swelling   [] Joint [name]   [] Low back [name] Hematologic:  [] Easy bruising  [] Easy bleeding   [] Hypercoagulable state   [x] Anemic  [] Hepatitis Gastrointestinal:  [] Blood in stool   [] Vomiting blood  [] Gastroesophageal reflux/heartburn   [] Abdominal [name] Genitourinary:  [] Chronic kidney disease   [] Difficult urination  [] Frequent urination  [] Burning with urination   [] Hematuria Skin:  [] Rashes   [x] Ulcers   [x] Wounds Psychological:  [] History of anxiety   []  History of major depression.    Physical Exam BP (!) 171/81 (BP Location: Right Arm)   Pulse 84   Resp 15   Ht 5' 5.5" (1.664 m)   Wt 58.1 kg (128 lb)   BMI 20.98 kg/m  Gen:  WD/WN, NAD. Appears younger than stated age. Head: [place]/AT, No temporalis wasting. Ear/Nose/Throat: Hearing grossly intact, nares w/o erythema or drainage, oropharynx w/o Erythema/Exudate Eyes: Conjunctiva clear, sclera non-icteric  Neck: trachea midline.  No JVD.  Pulmonary:  Good air movement, respirations not labored, no use of accessory muscles Cardiac: RRR, normal S1, S2 Vascular:  Vessel Right Left  Radial Palpable Palpable                          PT 1+ Palpable Not Palpable  DP 1+ Palpable Not Palpable   Gastrointestinal: soft, non-tender/non-distended.  Musculoskeletal: M/S 5/5 throughout.  Several small circular ulcerations on the left anterior leg overlying the shin. These are clean but have very pale granulation tissue. No surrounding erythema or drainage. Neurologic: Sensation grossly intact in extremities.  Symmetrical.  Speech is fluent. Motor exam as listed above. Psychiatric: Judgment intact, Mood & affect appropriate for pt's clinical situation. Dermatologic: Left lower extremity wounds as described above Lymph : No Cervical, Axillary, or  Inguinal lymphadenopathy.   Radiology No results found.  Labs No results found for this or any previous visit (from the past 2160 hour(s)).  Assessment/Plan:  Diabetes mellitus without complication blood glucose control important in reducing the progression of atherosclerotic disease. Also, involved in wound healing. On appropriate medications.   Hypertension blood pressure control important in reducing the progression of atherosclerotic disease. On appropriate oral medications.   Dyslipidemia lipid control important in reducing the progression of atherosclerotic disease. Continue statin therapy   Atherosclerosis of native arteries of the extremities with ulceration ([place]) Her noninvasive studies today show noncompressible vessels on the right but brisk waveforms and digital pressure of 71 on the right consistent with only mild arterial insufficiency. On the left,  her ABI 0.47 and this may be falsely elevated from calcification. No digital pressure was unobtainable on the left. This is consistent with severe arterial insufficiency. This represents a critical limb threatening situation. I then had a long discussion with the patient today regarding the scenario as well as her granddaughter. Although the ulcers are in the calf and not the foot, her arterial perfusion is markedly reduced on the left leg and will make wound healing difficult. This does represent a serious situation and given her marked arterial insufficiency, I think angiography with possible revascularization should be performed. I discussed the risks and benefits of this procedure. The patient desires to discuss this with more family members as well as her wound care physician next week. She will contact our office if she agrees to schedule and have this done.      [name] 06/25/2017, 5:08 PM   This note was created with Dragon medical transcription system.  Any errors from dictation are unintentional.

## 2017-06-25 NOTE — Assessment & Plan Note (Signed)
blood pressure control important in reducing the progression of atherosclerotic disease. On appropriate oral medications.  

## 2017-06-26 NOTE — Progress Notes (Signed)
[name], [name] ([id number]) Visit Report for 06/24/2017 Arrival Information Details Patient Name: [name], [name] Date of Service: 06/24/2017 10:15 AM Medical Record Number: [id number] Patient Account Number: [id number] Date of Birth/Sex: 1926-11-20 (83 y.o. Female) Treating RN: Afful, RN, BSN, Velva Harman Primary Care Mykeria Garman: Lucianne Lei Other Clinician: Referring Zyair Rhein: Lucianne Lei Treating Layken Doenges/Extender: Frann Rider in Treatment: 3 Visit Information History Since Last Visit All ordered tests and consults were completed: No Patient Arrived: [name] Added or deleted any medications: No Arrival Time: 10:20 Any new allergies or adverse reactions: No Accompanied By: self Had a fall or experienced change in No Transfer Assistance: None activities of daily living that may affect Patient Identification Verified: Yes risk of falls: Secondary Verification Process Completed: Yes Signs or symptoms of abuse/neglect since last No Patient Has Alerts: Yes visito Hospitalized since last visit: No Has Dressing in Place as Prescribed: Yes Pain Present Now: No Electronic Signature(s) Signed: 06/24/2017 4:40:57 PM By: Regan Lemming BSN, RN Entered By: Regan Lemming on 06/24/2017 10:20:44 Kwan, [name] ([id number]) -------------------------------------------------------------------------------- Encounter Discharge Information Details Patient Name: Alexa Lowes P. Date of Service: 06/24/2017 10:15 AM Medical Record Number: [id number] Patient Account Number: [id number] Date of Birth/Sex: 14-Jul-1926 (83 y.o. Female) Treating RN: Afful, RN, BSN, Velva Harman Primary Care Kristoph Sattler: Lucianne Lei Other Clinician: Referring Keiran Gaffey: Lucianne Lei Treating Danyel Griess/Extender: Frann Rider in Treatment: 3 Encounter Discharge Information Items Discharge Pain Level: 0 Discharge Condition: Stable Ambulatory Status: Cane Discharge Destination: Home Transportation: Private  Auto Schedule Follow-up Appointment: No Medication Reconciliation completed No and provided to Patient/Care Kirstina Leinweber: Provided on Clinical Summary of Care: 06/24/2017 Form Type Recipient Paper Patient GW Electronic Signature(s) Signed: 06/24/2017 4:40:57 PM By: Regan Lemming BSN, RN Previous Signature: 06/24/2017 10:40:55 AM Version By: Ruthine Dose Entered By: Regan Lemming on 06/24/2017 10:42:21 Winslett, [name] ([id number]) -------------------------------------------------------------------------------- Lower Extremity Assessment Details Patient Name: Alexa Lowes P. Date of Service: 06/24/2017 10:15 AM Medical Record Number: [id number] Patient Account Number: [id number] Date of Birth/Sex: 31-May-1926 (83 y.o. Female) Treating RN: Afful, RN, BSN, [place] Primary Care Shiquita Collignon: Lucianne Lei Other Clinician: Referring Sharne Linders: Lucianne Lei Treating Sieanna Vanstone/Extender: Frann Rider in Treatment: 3 Edema Assessment Assessed: [Left: No] [Right: No] E[Left: dema] [Right: :] Calf Left: Right: Point of Measurement: 33 cm From Medial Instep 27.4 cm cm Ankle Left: Right: Point of Measurement: 10 cm From Medial Instep 18.8 cm cm Vascular Assessment Claudication: Claudication Assessment [Left:None] Pulses: Dorsalis Pedis Palpable: [Left:Yes] Posterior Tibial Extremity colors, hair growth, and conditions: Extremity Color: [Left:Mottled] Hair Growth on Extremity: [Left:No] Temperature of Extremity: [Left:Warm] Capillary Refill: [Left:< 3 seconds] Toe Nail Assessment Left: Right: Thick: No Discolored: No Deformed: No Improper Length and Hygiene: No Electronic Signature(s) Signed: 06/24/2017 4:40:57 PM By: Regan Lemming BSN, RN Entered By: Regan Lemming on 06/24/2017 10:21:19 [name] ([id number]) [name], [name] ([id number]) -------------------------------------------------------------------------------- Multi Wound Chart Details Patient Name: Alexa Lowes P. Date of Service: 06/24/2017 10:15 AM Medical Record Number: [id number] Patient Account Number: [id number] Date of Birth/Sex: Aug 17, 1926 (83 y.o. Female) Treating RN: Baruch Gouty, RN, BSN, Velva Harman Primary Care Chrisma Hurlock: Lucianne Lei Other Clinician: Referring Aleicia Kenagy: Lucianne Lei Treating Deion Forgue/Extender: Frann Rider in Treatment: 3 Vital Signs Height(in): 65 Pulse(bpm): 91 Weight(lbs): 131 Blood Pressure 153/73 (mmHg): Body Mass Index(BMI): 22 Temperature(F): 97.9 Respiratory Rate 16 (breaths/min): Photos: [1:No Photos] [N/A:N/A] Wound Location: [1:Left Lower Leg - Anterior] [N/A:N/A] Wounding Event: [1:Trauma] [N/A:N/A] Primary Etiology: [1:Trauma, Other] [N/A:N/A] Comorbid History: [1:Anemia, Hypertension, Type II Diabetes, Osteoarthritis] [N/A:N/A] Date Acquired: [1:05/02/2017] [N/A:N/A] Weeks  of Treatment: [1:3] [N/A:N/A] Wound Status: [1:Open] [N/A:N/A] Measurements L x W x D 6.8x1.2x0.1 [N/A:N/A] (cm) Area (cm) : [1:6.409] [N/A:N/A] Volume (cm) : [1:0.641] [N/A:N/A] % Reduction in Area: [1:-33.30%] [N/A:N/A] % Reduction in Volume: 33.30% [N/A:N/A] Classification: [1:Full Thickness Without Exposed Support Structures] [N/A:N/A] HBO Classification: [1:Grade 1] [N/A:N/A] Exudate Amount: [1:Medium] [N/A:N/A] Exudate Type: [1:Serous] [N/A:N/A] Exudate Color: [1:amber] [N/A:N/A] Wound Margin: [1:Flat and Intact] [N/A:N/A] Granulation Amount: [1:None Present (0%)] [N/A:N/A] Necrotic Amount: [1:Large (67-100%)] [N/A:N/A] Exposed Structures: [1:Fat Layer (Subcutaneous Tissue) Exposed: Yes Fascia: No Tendon: No] [N/A:N/A] Muscle: No Joint: No Bone: No Epithelialization: None N/A N/A Debridement: Debridement ([id number]- N/A N/A 11047) Pre-procedure 10:29 N/A N/A Verification/Time Out Taken: Pain Control: Lidocaine 4% Topical N/A N/A Solution Tissue Debrided: Fibrin/Slough, Fat, N/A N/A Subcutaneous Level: Skin/Subcutaneous N/A N/A Tissue Debridement  Area (sq 8.16 N/A N/A cm): Instrument: Curette N/A N/A Bleeding: Minimum N/A N/A Hemostasis Achieved: Pressure N/A N/A Procedural Pain: 0 N/A N/A Post Procedural Pain: 0 N/A N/A Debridement Treatment Procedure was tolerated N/A N/A Response: well Post Debridement 6.8x1.2x0.2 N/A N/A Measurements L x W x D (cm) Post Debridement 1.282 N/A N/A Volume: (cm) Periwound Skin Texture: Scarring: Yes N/A N/A Excoriation: No Induration: No Callus: No Crepitus: No Rash: No Periwound Skin Maceration: No N/A N/A Moisture: Dry/Scaly: No Periwound Skin Color: Erythema: Yes N/A N/A Atrophie Blanche: No Cyanosis: No Ecchymosis: No Hemosiderin Staining: No Mottled: No Pallor: No Rubor: No Erythema Location: Circumferential N/A N/A Temperature: No Abnormality N/A N/A Tenderness on Yes N/A N/A Palpation: Wound Preparation: N/A N/A IYONNA, RISH ([id number]) Ulcer Cleansing: Rinsed/Irrigated with Saline Topical Anesthetic Applied: Other: lidocaine 4% Procedures Performed: Debridement N/A N/A Treatment Notes Electronic Signature(s) Signed: 06/24/2017 10:40:16 AM By: Christin Fudge MD, FACS Entered By: Christin Fudge on 06/24/2017 10:40:16 Branca, [name] ([id number]) -------------------------------------------------------------------------------- [place] Details Patient Name: Alexa Lowes P. Date of Service: 06/24/2017 10:15 AM Medical Record Number: [id number] Patient Account Number: [id number] Date of Birth/Sex: 10-24-1926 (83 y.o. Female) Treating RN: Afful, RN, BSN, Velva Harman Primary Care Grigor Lipschutz: Lucianne Lei Other Clinician: Referring Kaidan Harpster: Lucianne Lei Treating Rodman Recupero/Extender: Frann Rider in Treatment: 3 Active Inactive ` Abuse / Safety / Falls / Self Care Management Nursing Diagnoses: Potential for falls Goals: Patient will remain injury free related to falls Date Initiated: 05/28/2017 Target Resolution Date:  07/23/2017 Goal Status: Active Interventions: Assess fall risk on admission and as needed Notes: ` Orientation to the Wound Care Program Nursing Diagnoses: Knowledge deficit related to the wound healing center program Goals: Patient/caregiver will verbalize understanding of the [place] Program Date Initiated: 05/28/2017 Target Resolution Date: 07/23/2017 Goal Status: Active Interventions: Provide education on orientation to the wound center Notes: ` Wound/Skin Impairment Nursing Diagnoses: Impaired tissue integrity Mucha, Alayzha P. ([id number]) Goals: Ulcer/skin breakdown will have a volume reduction of 30% by week 4 Date Initiated: 05/28/2017 Target Resolution Date: 07/23/2017 Goal Status: Active Ulcer/skin breakdown will have a volume reduction of 50% by week 8 Date Initiated: 05/28/2017 Target Resolution Date: 07/23/2017 Goal Status: Active Ulcer/skin breakdown will have a volume reduction of 80% by week 12 Date Initiated: 05/28/2017 Target Resolution Date: 07/23/2017 Goal Status: Active Ulcer/skin breakdown will heal within 14 weeks Date Initiated: 05/28/2017 Target Resolution Date: 07/23/2017 Goal Status: Active Interventions: Assess patient/caregiver ability to obtain necessary supplies Assess patient/caregiver ability to perform ulcer/skin care regimen upon admission and as needed Assess ulceration(s) every visit Notes: Electronic Signature(s) Signed: 06/24/2017 4:40:57 PM By: Regan Lemming BSN, RN Entered By: Regan Lemming on  06/24/2017 10:26:25 Quintero, KYHCWCBJ PMarland Kitchen ([id number]) -------------------------------------------------------------------------------- Pain Assessment Details Patient Name: MIKAYA, BUNNER P. Date of Service: 06/24/2017 10:15 AM Medical Record Number: [id number] Patient Account Number: [id number] Date of Birth/Sex: 04-03-1926 (83 y.o. Female) Treating RN: Afful, RN, BSN, Velva Harman Primary Care Owen Pratte: Lucianne Lei Other  Clinician: Referring Jebadiah Imperato: Lucianne Lei Treating Jaaron Oleson/Extender: Frann Rider in Treatment: 3 Active Problems Location of Pain Severity and Description of Pain Patient Has Paino No Site Locations With Dressing Change: No Pain Management and Medication Current Pain Management: Electronic Signature(s) Signed: 06/24/2017 4:40:57 PM By: Regan Lemming BSN, RN Entered By: Regan Lemming on 06/24/2017 10:20:52 [name] ([id number]) -------------------------------------------------------------------------------- Patient/Caregiver Education Details Patient Name: Alexa Lowes P. Date of Service: 06/24/2017 10:15 AM Medical Record Number: [id number] Patient Account Number: [id number] Date of Birth/Gender: [birth year] (83 y.o. Female) Treating RN: Baruch Gouty, RN, BSN, Velva Harman Primary Care Physician: Lucianne Lei Other Clinician: Referring Physician: Lucianne Lei Treating Physician/Extender: Frann Rider in Treatment: 3 Education Assessment Education Provided To: Patient Education Topics Provided Welcome To [facility]: Methods: Explain/Verbal Responses: Reinforcements needed Electronic Signature(s) Signed: 06/24/2017 4:40:57 PM By: Regan Lemming BSN, RN Entered By: Regan Lemming on 06/24/2017 10:42:32 Bartoszek, [name] ([id number]) -------------------------------------------------------------------------------- Wound Assessment Details Patient Name: Alexa Lowes P. Date of Service: 06/24/2017 10:15 AM Medical Record Number: [id number] Patient Account Number: [id number] Date of Birth/Sex: 04/22/1926 (83 y.o. Female) Treating RN: Afful, RN, BSN, [place] Primary Care Can Lucci: Lucianne Lei Other Clinician: Referring Ellen Goris: Lucianne Lei Treating Josip Merolla/Extender: Frann Rider in Treatment: 3 Wound Status Wound Number: 1 Primary Trauma, Other Etiology: Wound Location: Left Lower Leg - Anterior Wound Open Wounding Event: Trauma Status: Date  Acquired: 05/02/2017 Comorbid Anemia, Hypertension, Type II Weeks Of Treatment: 3 History: Diabetes, Osteoarthritis Clustered Wound: No Photos Photo Uploaded By: Regan Lemming on 06/24/2017 16:50:30 Wound Measurements Length: (cm) 6.8 Width: (cm) 1.2 Depth: (cm) 0.1 Area: (cm) 6.409 Volume: (cm) 0.641 % Reduction in Area: -33.3% % Reduction in Volume: 33.3% Epithelialization: None Tunneling: No Undermining: No Wound Description Full Thickness Without Foul Odor Aft Classification: Exposed Support Structures Slough/Fibrin Diabetic Severity Grade 1 ([place]): Wound Margin: Flat and Intact Exudate Amount: Medium Exudate Type: Serous Exudate Color: amber er Cleansing: No o Yes Wound Bed Granulation Amount: None Present (0%) Exposed Structure Necrotic Amount: Large (67-100%) Fascia Exposed: No P[place], Reita P. ([id number]) Necrotic Quality: Adherent Slough Fat Layer (Subcutaneous Tissue) Exposed: Yes Tendon Exposed: No Muscle Exposed: No Joint Exposed: No Bone Exposed: No Periwound Skin Texture Texture Color No Abnormalities Noted: No No Abnormalities Noted: No Callus: No Atrophie Blanche: No Crepitus: No Cyanosis: No Excoriation: No Ecchymosis: No Induration: No Erythema: Yes Rash: No Erythema Location: Circumferential Scarring: Yes Hemosiderin Staining: No Mottled: No Moisture Pallor: No No Abnormalities Noted: No Rubor: No Dry / Scaly: No Maceration: No Temperature / Pain Temperature: No Abnormality Tenderness on Palpation: Yes Wound Preparation Ulcer Cleansing: Rinsed/Irrigated with Saline Topical Anesthetic Applied: Other: lidocaine 4%, Treatment Notes Wound #1 (Left, Anterior Lower Leg) 1. Cleansed with: Clean wound with Normal Saline 3. Peri-wound Care: Skin Prep 4. Dressing Applied: Santyl Ointment 5. Secondary Dressing Applied Bordered Foam Dressing Dry Gauze Electronic Signature(s) Signed: 06/24/2017 4:40:57 PM By: Regan Lemming  BSN, RN Entered By: Regan Lemming on 06/24/2017 10:26:16 Valent, [name] ([id number]) -------------------------------------------------------------------------------- Vitals Details Patient Name: Alexa Lowes P. Date of Service: 06/24/2017 10:15 AM Medical Record Number: [id number] Patient Account Number: [id number] Date of Birth/Sex: May 04, 1926 (83 y.o. Female) Treating RN: Afful, RN, BSN, [facility]  [place] Peden: Lucianne Lei Other Clinician: Referring Zasha Belleau: Lucianne Lei Treating Lezley Bedgood/Extender: Frann Rider in Treatment: 3 Vital Signs Time Taken: 10:23 Temperature (F): 97.9 Height (in): 65 Pulse (bpm): 91 Weight (lbs): 131 Respiratory Rate (breaths/min): 16 Body Mass Index (BMI): 21.8 Blood Pressure (mmHg): 153/73 Reference Range: 80 - 120 mg / dl Electronic Signature(s) Signed: 06/24/2017 4:40:57 PM By: Regan Lemming BSN, RN Entered By: Regan Lemming on 06/24/2017 10:23:47

## 2017-06-28 NOTE — Progress Notes (Signed)
FRENCHIE, PRIBYL ([id number]) Visit Report for 06/24/2017 Chief Complaint Document Details Patient Name: [name], [name] Date of Service: 06/24/2017 10:15 AM Medical Record Number: [id number] Patient Account Number: [id number] Date of Birth/Sex: [birth year] (83 y.o. Female) Treating RN: Afful, RN, BSN, Velva Harman Primary Care Provider: Lucianne Lei Other Clinician: Referring Provider: Lucianne Lei Treating Provider/Extender: Frann Rider in Treatment: 3 Information Obtained from: Patient Chief Complaint Patients presents for treatment of an open diabetic ulcer 2 the left lower anterior leg which she sustained an injury by blunt trauma Electronic Signature(s) Signed: 06/24/2017 10:41:02 AM By: Christin Fudge MD, FACS Entered By: Christin Fudge on 06/24/2017 10:41:01 [place], MPNTIRWE P. ([id number]) -------------------------------------------------------------------------------- Debridement Details Patient Name: Alexa Lowes P. Date of Service: 06/24/2017 10:15 AM Medical Record Number: [id number] Patient Account Number: [id number] Date of Birth/Sex: 12-19-1925 (83 y.o. Female) Treating RN: Afful, RN, BSN, [place] Primary Care Provider: Lucianne Lei Other Clinician: Referring Provider: Lucianne Lei Treating Provider/Extender: Frann Rider in Treatment: 3 Debridement Performed for Wound #1 Left,Anterior Lower Leg Assessment: Performed By: Physician Christin Fudge, MD Debridement: Debridement Pre-procedure Verification/Time Out Yes - 10:29 Taken: Start Time: 10:29 Pain Control: Lidocaine 4% Topical Solution Level: Skin/Subcutaneous Tissue Total Area Debrided (L x 6.8 (cm) x 1.2 (cm) = 8.16 (cm) W): Tissue and other Viable, Non-Viable, Fat, Fibrin/Slough, Subcutaneous material debrided: Instrument: Curette Bleeding: Minimum Hemostasis Achieved: Pressure End Time: 10:34 Procedural Pain: 0 Post Procedural Pain: 0 Response to Treatment: Procedure was tolerated  well Post Debridement Measurements of Total Wound Length: (cm) 6.8 Width: (cm) 1.2 Depth: (cm) 0.2 Volume: (cm) 1.282 Character of Wound/Ulcer Post Requires Further Debridement Debridement: Post Procedure Diagnosis Same as Pre-procedure Electronic Signature(s) Signed: 06/24/2017 10:40:42 AM By: Christin Fudge MD, FACS Signed: 06/24/2017 4:40:57 PM By: Regan Lemming BSN, RN Entered By: Christin Fudge on 06/24/2017 10:40:41 Long, Taia P. ([id number]) -------------------------------------------------------------------------------- HPI Details Patient Name: Alexa Lowes P. Date of Service: 06/24/2017 10:15 AM Medical Record Number: [id number] Patient Account Number: [id number] Date of Birth/Sex: 26-Oct-1926 (83 y.o. Female) Treating RN: Afful, RN, BSN, Velva Harman Primary Care Provider: Lucianne Lei Other Clinician: Referring Provider: Lucianne Lei Treating Provider/Extender: Frann Rider in Treatment: 3 History of Present Illness Location: left anterior shin Quality: Patient reports experiencing a dull pain to affected area(s). Severity: Patient states wound are getting worse. Duration: Patient has had the wound for < 3 weeks prior to presenting for treatment Timing: Pain in wound is Intermittent (comes and goes Context: The wound occurred when the patient blunt trauma against her left shin Modifying Factors: Other treatment(s) tried include:active Bactroban ointment locally Associated Signs and Symptoms: Patient reports having: occasional discharge from the wound HPI Description: 83 year old patient was seen in the ED about 10 days ago for a history of abrasion to the left lower extremity while she was boarding a bus and scraped the anterior part of her left leg. She has been a diabetic for many years and has been taking treatment regularly. Her past medical history is also significant for anemia arthritis constipation and hypertension and is status post abdominal  hysterectomy. She has never been a smoker. after her ER visit she was advised to apply Bactroban ointment to the wound twice a day and continue to monitor her blood glucose levels. her last hemoglobin A1c was done 3 years ago and was 6.6% 06/04/17 patient left anterior shin wound appears to be doing well although it is still somewhat dry despite the treatment with Medihoney. We have been using Kerlex following the Medihoney application and  I believe this is just not retaining that much moisture. Nonetheless there is no evidence of infection. 06/11/17 on evaluation today patient appears to be doing a little bit worse in regard to her wound. The entirety of the wound is dry and unfortunately the Medihoney does not seem to be helping this. That is even with the dressing changes that I made last week to try to retain more moisture. She has not tried [employer] as of yet. She was also noncompressible when testing for ABIs. Electronic Signature(s) Signed: 06/24/2017 10:41:11 AM By: Christin Fudge MD, FACS Entered By: Christin Fudge on 06/24/2017 10:41:10 [name] ([id number]) -------------------------------------------------------------------------------- Physical Exam Details Patient Name: Alexa Lowes P. Date of Service: 06/24/2017 10:15 AM Medical Record Number: [id number] Patient Account Number: [id number] Date of Birth/Sex: 1926-04-08 (83 y.o. Female) Treating RN: Baruch Gouty, RN, BSN, Velva Harman Primary Care Provider: Lucianne Lei Other Clinician: Referring Provider: Lucianne Lei Treating Provider/Extender: Frann Rider in Treatment: 3 Constitutional . Pulse regular. Respirations normal and unlabored. Afebrile. . Eyes Nonicteric. Reactive to light. Ears, Nose, Mouth, and Throat Lips, teeth, and gums WNL.Marland Kitchen Moist mucosa without lesions. Neck supple and nontender. No palpable supraclavicular or cervical adenopathy. Normal sized without goiter. Respiratory WNL. No retractions.. Breath  sounds WNL, No rubs, rales, rhonchi, or wheeze.. Cardiovascular Heart rhythm and rate regular, no murmur or gallop.. Pedal Pulses WNL. No clubbing, cyanosis or edema. Chest Breasts symmetical and no nipple discharge.. Breast tissue WNL, no masses, lumps, or tenderness.. Lymphatic No adneopathy. No adenopathy. No adenopathy. Musculoskeletal Adexa without tenderness or enlargement.. Digits and nails w/o clubbing, cyanosis, infection, petechiae, ischemia, or inflammatory conditions.. Integumentary (Hair, Skin) No suspicious lesions. No crepitus or fluctuance. No peri-wound warmth or erythema. No masses.Marland Kitchen Psychiatric Judgement and insight Intact.. No evidence of depression, anxiety, or agitation.. Notes the wound continues to have a lot of slough which has softened with the Santyl ointment and using a #3 curet I have sharply removed a lot of fit today down to some healthy granulation tissue in a few places. Minimal bleeding controlled with pressure Electronic Signature(s) Signed: 06/24/2017 10:41:52 AM By: Christin Fudge MD, FACS Entered By: Christin Fudge on 06/24/2017 10:41:52 [name] ([id number]) -------------------------------------------------------------------------------- Physician Orders Details Patient Name: Alexa Lowes P. Date of Service: 06/24/2017 10:15 AM Medical Record Number: [id number] Patient Account Number: [id number] Date of Birth/Sex: 04/30/1926 (83 y.o. Female) Treating RN: Baruch Gouty, RN, BSN, Velva Harman Primary Care Provider: Lucianne Lei Other Clinician: Referring Provider: Lucianne Lei Treating Provider/Extender: Frann Rider in Treatment: 3 Verbal / Phone Orders: No Diagnosis Coding Wound Cleansing Wound #1 Left,Anterior Lower Leg o Clean wound with Normal Saline. o May Shower, gently pat wound dry prior to applying new dressing. Anesthetic Wound #1 Left,Anterior Lower Leg o Topical Lidocaine 4% cream applied to wound bed prior to  debridement Primary Wound Dressing Wound #1 Left,Anterior Lower Leg o Santyl Ointment Secondary Dressing Wound #1 Left,Anterior Lower Leg o Boardered Foam Dressing Dressing Change Frequency Wound #1 Left,Anterior Lower Leg o Change dressing every day. Follow-up Appointments Wound #1 Left,Anterior Lower Leg o Return Appointment in 1 week. Edema Control Wound #1 Left,Anterior Lower Leg o Elevate legs to the level of the heart and pump ankles as often as possible Additional Orders / Instructions Wound #1 Left,Anterior Lower Leg o Increase protein intake. o Other: - Please add vitamin A, vitamin C and zinc supplements to your diet ROZANNA, CORMANY ([id number]) Electronic Signature(s) Signed: 06/24/2017 3:53:48 PM By: Christin Fudge MD, FACS Signed: 06/24/2017 4:40:57 PM  By: Regan Lemming BSN, RN Entered By: Regan Lemming on 06/24/2017 10:40:17 [name] ([id number]) -------------------------------------------------------------------------------- Problem List Details Patient Name: Alexa Lowes P. Date of Service: 06/24/2017 10:15 AM Medical Record Number: [id number] Patient Account Number: [id number] Date of Birth/Sex: [birth year] (83 y.o. Female) Treating RN: Baruch Gouty, RN, BSN, Velva Harman Primary Care Provider: Lucianne Lei Other Clinician: Referring Provider: Lucianne Lei Treating Provider/Extender: Frann Rider in Treatment: 3 Active Problems ICD-10 Encounter Code Description Active Date Diagnosis E11.622 Type 2 diabetes mellitus with other skin ulcer 05/28/2017 Yes L97.222 Non-pressure chronic ulcer of left calf with fat layer 05/28/2017 Yes exposed Inactive Problems Resolved Problems Electronic Signature(s) Signed: 06/24/2017 10:40:09 AM By: Christin Fudge MD, FACS Entered By: Christin Fudge on 06/24/2017 10:40:09 Bewick, MWNUUVOZ P. ([id number]) -------------------------------------------------------------------------------- Progress Note  Details Patient Name: Alexa Lowes P. Date of Service: 06/24/2017 10:15 AM Medical Record Number: [id number] Patient Account Number: [id number] Date of Birth/Sex: [birth year] (83 y.o. Female) Treating RN: Afful, RN, BSN, Velva Harman Primary Care Provider: Lucianne Lei Other Clinician: Referring Provider: Lucianne Lei Treating Provider/Extender: Frann Rider in Treatment: 3 Subjective Chief Complaint Information obtained from Patient Patients presents for treatment of an open diabetic ulcer 2 the left lower anterior leg which she sustained an injury by blunt trauma History of Present Illness (HPI) The following HPI elements were documented for the patient's wound: Location: left anterior shin Quality: Patient reports experiencing a dull pain to affected area(s). Severity: Patient states wound are getting worse. Duration: Patient has had the wound for < 3 weeks prior to presenting for treatment Timing: Pain in wound is Intermittent (comes and goes Context: The wound occurred when the patient blunt trauma against her left shin Modifying Factors: Other treatment(s) tried include:active Bactroban ointment locally Associated Signs and Symptoms: Patient reports having: occasional discharge from the wound 83 year old patient was seen in the ED about 10 days ago for a history of abrasion to the left lower extremity while she was boarding a bus and scraped the anterior part of her left leg. She has been a diabetic for many years and has been taking treatment regularly. Her past medical history is also significant for anemia arthritis constipation and hypertension and is status post abdominal hysterectomy. She has never been a smoker. after her ER visit she was advised to apply Bactroban ointment to the wound twice a day and continue to monitor her blood glucose levels. her last hemoglobin A1c was done 3 years ago and was 6.6% 06/04/17 patient left anterior shin wound appears to be doing well  although it is still somewhat dry despite the treatment with Medihoney. We have been using Kerlex following the Medihoney application and I believe this is just not retaining that much moisture. Nonetheless there is no evidence of infection. 06/11/17 on evaluation today patient appears to be doing a little bit worse in regard to her wound. The entirety of the wound is dry and unfortunately the Medihoney does not seem to be helping this. That is even with the dressing changes that I made last week to try to retain more moisture. She has not tried [employer] as of yet. She was also noncompressible when testing for ABIs. Marchiano, Breelle P. ([id number]) Objective Constitutional Pulse regular. Respirations normal and unlabored. Afebrile. Vitals Time Taken: 10:23 AM, Height: 65 in, Weight: 131 lbs, BMI: 21.8, Temperature: 97.9 F, Pulse: 91 bpm, Respiratory Rate: 16 breaths/min, Blood Pressure: 153/73 mmHg. Eyes Nonicteric. Reactive to light. Ears, Nose, Mouth, and Throat Lips, teeth, and gums WNL.Marland Kitchen Moist mucosa without  lesions. Neck supple and nontender. No palpable supraclavicular or cervical adenopathy. Normal sized without goiter. Respiratory WNL. No retractions.. Breath sounds WNL, No rubs, rales, rhonchi, or wheeze.. Cardiovascular Heart rhythm and rate regular, no murmur or gallop.. Pedal Pulses WNL. No clubbing, cyanosis or edema. Chest Breasts symmetical and no nipple discharge.. Breast tissue WNL, no masses, lumps, or tenderness.. Lymphatic No adneopathy. No adenopathy. No adenopathy. Musculoskeletal Adexa without tenderness or enlargement.. Digits and nails w/o clubbing, cyanosis, infection, petechiae, ischemia, or inflammatory conditions.Marland Kitchen Psychiatric Judgement and insight Intact.. No evidence of depression, anxiety, or agitation.. General Notes: the wound continues to have a lot of slough which has softened with the Santyl ointment and using a #3 curet I have sharply removed a  lot of fit today down to some healthy granulation tissue in a few places. Minimal bleeding controlled with pressure Integumentary (Hair, Skin) No suspicious lesions. No crepitus or fluctuance. No peri-wound warmth or erythema. No masses.. Wound #1 status is Open. Original cause of wound was Trauma. The wound is located on the Left,Anterior Lower Leg. The wound measures 6.8cm length x 1.2cm width x 0.1cm depth; 6.409cm^2 area and Digioia, Sharay P. ([id number]) 0.641cm^3 volume. There is Fat Layer (Subcutaneous Tissue) Exposed exposed. There is no tunneling or undermining noted. There is a medium amount of serous drainage noted. The wound margin is flat and intact. There is no granulation within the wound bed. There is a large (67-100%) amount of necrotic tissue within the wound bed including Adherent Slough. The periwound skin appearance exhibited: Scarring, Erythema. The periwound skin appearance did not exhibit: Callus, Crepitus, Excoriation, Induration, Rash, Dry/Scaly, Maceration, Atrophie Blanche, Cyanosis, Ecchymosis, Hemosiderin Staining, Mottled, Pallor, Rubor. The surrounding wound skin color is noted with erythema which is circumferential. Periwound temperature was noted as No Abnormality. The periwound has tenderness on palpation. 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 There was a Skin/Subcutaneous Tissue Debridement ([id number]) debridement with total area of 8.16 sq cm performed by Christin Fudge, MD. with the following instrument(s): Curette to remove Viable and Non-Viable tissue/material including Fat Layer (and Subcutaneous Tissue) Exposed, Fibrin/Slough, and Subcutaneous after achieving pain control using Lidocaine 4% Topical Solution.  A time out was conducted at 10:29, prior to the start of the procedure. A Minimum amount of bleeding was controlled with Pressure. The procedure was tolerated well with a pain level of 0 throughout and a pain level of 0 following the procedure. Post Debridement Measurements: 6.8cm length x 1.2cm width x 0.2cm depth; 1.282cm^3 volume. Character of Wound/Ulcer Post Debridement requires further debridement. Post procedure Diagnosis Wound #1: Same as Pre-Procedure Plan Wound Cleansing: Wound #1 Left,Anterior Lower Leg: Clean wound with Normal Saline. KELLENE, MCCLEARY ([id number]) May Shower, gently pat wound dry prior to applying new dressing. Anesthetic: Wound #1 Left,Anterior Lower Leg: Topical Lidocaine 4% cream applied to wound bed prior to debridement Primary Wound Dressing: Wound #1 Left,Anterior Lower Leg: Santyl Ointment Secondary Dressing: Wound #1 Left,Anterior Lower Leg: Boardered Foam Dressing Dressing Change Frequency: Wound #1 Left,Anterior Lower Leg: Change dressing every day. Follow-up Appointments: Wound #1 Left,Anterior Lower Leg: Return Appointment in 1 week. Edema Control: Wound #1 Left,Anterior Lower Leg: Elevate legs to the level of the heart and pump ankles as often as possible Additional Orders / Instructions: Wound #1 Left,Anterior  Lower Leg: Increase protein intake. Other: - Please add vitamin A, vitamin C and zinc supplements to your diet after review today I have recommended: 1. Daily washing with soap and water and application of Santyl ointment locally with a light Kerlix dressing. 2. good control of her diabetes mellitus 3. Adequate protein, vitamin A, vitamin C and zinc 4. keep her appointment for a arterial duplex study tomorrow 5. Regular visits the wound center Electronic Signature(s) Signed: 06/24/2017 10:42:57 AM By: Christin Fudge MD, FACS Entered By: Christin Fudge on 06/24/2017 10:42:57 Cappelletti, [name]  ([id number]) -------------------------------------------------------------------------------- SuperBill Details Patient Name: Alexa Lowes P. Date of Service: 06/24/2017 Medical Record Number: [id number] Patient Account Number: [id number] Date of Birth/Sex: [birth year] (83 y.o. Female) Treating RN: Afful, RN, BSN, [place] Primary Care Provider: Lucianne Lei Other Clinician: Referring Provider: Lucianne Lei Treating Provider/Extender: Frann Rider in Treatment: 3 Diagnosis Coding ICD-10 Codes Code Description E11.622 Type 2 diabetes mellitus with other skin ulcer L97.222 Non-pressure chronic ulcer of left calf with fat layer exposed Facility Procedures CPT4 Code: [id number] Description: 11042 - DEB SUBQ TISSUE 20 SQ CM/< ICD-10 Description Diagnosis E11.622 Type 2 diabetes mellitus with other skin ulcer L97.222 Non-pressure chronic ulcer of left calf with fat l Modifier: ayer exposed Quantity: 1 Physician Procedures CPT4 Code: 5992341 Description: 11042 - WC PHYS SUBQ TISS 20 SQ CM ICD-10 Description Diagnosis E11.622 Type 2 diabetes mellitus with other skin ulcer L97.222 Non-pressure chronic ulcer of left calf with fat l Modifier: ayer exposed Quantity: 1 Electronic Signature(s) Signed: 06/24/2017 10:43:09 AM By: Christin Fudge MD, FACS Entered By: Christin Fudge on 06/24/2017 10:43:08

## 2017-07-01 ENCOUNTER — Encounter: Payer: Medicare Other | Attending: Surgery | Admitting: Surgery

## 2017-07-01 DIAGNOSIS — E11622 Type 2 diabetes mellitus with other skin ulcer: Secondary | ICD-10-CM | POA: Insufficient documentation

## 2017-07-01 DIAGNOSIS — D649 Anemia, unspecified: Secondary | ICD-10-CM | POA: Insufficient documentation

## 2017-07-01 DIAGNOSIS — I70242 Atherosclerosis of native arteries of left leg with ulceration of calf: Secondary | ICD-10-CM | POA: Insufficient documentation

## 2017-07-01 DIAGNOSIS — M1990 Unspecified osteoarthritis, unspecified site: Secondary | ICD-10-CM | POA: Insufficient documentation

## 2017-07-01 DIAGNOSIS — L97222 Non-pressure chronic ulcer of left calf with fat layer exposed: Secondary | ICD-10-CM | POA: Diagnosis not present

## 2017-07-01 DIAGNOSIS — Z7984 Long term (current) use of oral hypoglycemic drugs: Secondary | ICD-10-CM | POA: Insufficient documentation

## 2017-07-01 DIAGNOSIS — Z79899 Other long term (current) drug therapy: Secondary | ICD-10-CM | POA: Diagnosis not present

## 2017-07-01 DIAGNOSIS — I10 Essential (primary) hypertension: Secondary | ICD-10-CM | POA: Diagnosis not present

## 2017-07-03 NOTE — Progress Notes (Signed)
BASIA, MCGINTY ([id number]) Visit Report for 07/01/2017 Arrival Information Details Patient Name: KIVA, NORLAND P. Date of Service: 07/01/2017 1:30 PM Medical Record Number: [id number] Patient Account Number: [id number] Date of Birth/Sex: 15-Jul-1926 (83 y.o. Female) Treating RN: Montey Hora Primary Care Baudelio Karnes: Lucianne Lei Other Clinician: Referring Keilon Ressel: Lucianne Lei Treating Dilan Fullenwider/Extender: Frann Rider in Treatment: 4 Visit Information History Since Last Visit Added or deleted any medications: No Patient Arrived: Cane Any new allergies or adverse reactions: No Arrival Time: 13:26 Had a fall or experienced change in No Accompanied By: dtr activities of daily living that may affect Transfer Assistance: None risk of falls: Patient Identification Verified: Yes Signs or symptoms of abuse/neglect since last No Secondary Verification Process Completed: Yes visito Patient Has Alerts: Yes Hospitalized since last visit: No Has Dressing in Place as Prescribed: Yes Pain Present Now: No Electronic Signature(s) Signed: 07/01/2017 4:43:58 PM By: Montey Hora Entered By: Montey Hora on 07/01/2017 13:26:37 Castanon, JJOACZYS A. ([id number]) -------------------------------------------------------------------------------- Encounter Discharge Information Details Patient Name: [name] Date of Service: 07/01/2017 1:30 PM Medical Record Number: [id number] Patient Account Number: [id number] Date of Birth/Sex: 1926/01/23 (83 y.o. Female) Treating RN: Montey Hora Primary Care Bran Aldridge: Lucianne Lei Other Clinician: Referring Alpha Mysliwiec: Lucianne Lei Treating Dexton Zwilling/Extender: Frann Rider in Treatment: 4 Encounter Discharge Information Items Discharge Pain Level: 0 Discharge Condition: Stable Ambulatory Status: Cane Discharge Destination: Home Transportation: Private Auto Accompanied By: dtr Schedule Follow-up Appointment: Yes Medication  Reconciliation completed No and provided to Patient/Care Mackson Botz: Provided on Clinical Summary of Care: 07/01/2017 Form Type Recipient Paper Patient GW Electronic Signature(s) Signed: 07/01/2017 1:56:02 PM By: Ruthine Dose Entered By: Ruthine Dose on 07/01/2017 13:56:02 Hines, [name] ([id number]) -------------------------------------------------------------------------------- Lower Extremity Assessment Details Patient Name: [name] Date of Service: 07/01/2017 1:30 PM Medical Record Number: [id number] Patient Account Number: [id number] Date of Birth/Sex: [birth year] (83 y.o. Female) Treating RN: Montey Hora Primary Care Amorah Sebring: Lucianne Lei Other Clinician: Referring Nikia Mangino: Lucianne Lei Treating Treyon Wymore/Extender: Frann Rider in Treatment: 4 Vascular Assessment Pulses: Dorsalis Pedis Palpable: [Left:Yes] Posterior Tibial Extremity colors, hair growth, and conditions: Extremity Color: [Left:Hyperpigmented] Hair Growth on Extremity: [Left:No] Temperature of Extremity: [Left:Warm] Capillary Refill: [Left:< 3 seconds] Electronic Signature(s) Signed: 07/01/2017 4:43:58 PM By: Montey Hora Entered By: Montey Hora on 07/01/2017 13:33:58 Cass, VOHYWVPX P. ([id number]) -------------------------------------------------------------------------------- Multi Wound Chart Details Patient Name: [name] Date of Service: 07/01/2017 1:30 PM Medical Record Number: [id number] Patient Account Number: [id number] Date of Birth/Sex: 02-Aug-1926 (83 y.o. Female) Treating RN: Montey Hora Primary Care Zahi Plaskett: Lucianne Lei Other Clinician: Referring Leasha Goldberger: Lucianne Lei Treating Josanna Hefel/Extender: Frann Rider in Treatment: 4 Vital Signs Height(in): 65 Pulse(bpm): 82 Weight(lbs): 131 Blood Pressure 156/68 (mmHg): Body Mass Index(BMI): 22 Temperature(F): 98.0 Respiratory Rate 16 (breaths/min): Photos: [1:No Photos] [N/A:N/A] Wound  Location: [1:Left Lower Leg - Anterior] [N/A:N/A] Wounding Event: [1:Trauma] [N/A:N/A] Primary Etiology: [1:Trauma, Other] [N/A:N/A] Comorbid History: [1:Anemia, Hypertension, Type II Diabetes, Osteoarthritis] [N/A:N/A] Date Acquired: [1:05/02/2017] [N/A:N/A] Weeks of Treatment: [1:4] [N/A:N/A] Wound Status: [1:Open] [N/A:N/A] Measurements L x W x D 6.7x1.1x0.1 [N/A:N/A] (cm) Area (cm) : [1:5.788] [N/A:N/A] Volume (cm) : [1:0.579] [N/A:N/A] % Reduction in Area: [1:-20.40%] [N/A:N/A] % Reduction in Volume: 39.80% [N/A:N/A] Classification: [1:Full Thickness Without Exposed Support Structures] [N/A:N/A] HBO Classification: [1:Grade 1] [N/A:N/A] Exudate Amount: [1:Medium] [N/A:N/A] Exudate Type: [1:Serous] [N/A:N/A] Exudate Color: [1:amber] [N/A:N/A] Wound Margin: [1:Flat and Intact] [N/A:N/A] Granulation Amount: [1:None Present (0%)] [N/A:N/A] Necrotic Amount: [1:Large (67-100%)] [N/A:N/A] Exposed Structures: [1:Fat Layer (Subcutaneous Tissue) Exposed: Yes Fascia: No  Tendon: No] [N/A:N/A] Muscle: No Joint: No Bone: No Epithelialization: None N/A N/A Debridement: Debridement (80998- N/A N/A 11047) Pre-procedure 13:44 N/A N/A Verification/Time Out Taken: Pain Control: Lidocaine 4% Topical N/A N/A Solution Tissue Debrided: Fibrin/Slough, N/A N/A Subcutaneous Level: Skin/Subcutaneous N/A N/A Tissue Debridement Area (sq 7.37 N/A N/A cm): Instrument: Curette N/A N/A Bleeding: Minimum N/A N/A Hemostasis Achieved: Pressure N/A N/A Procedural Pain: 0 N/A N/A Post Procedural Pain: 0 N/A N/A Debridement Treatment Procedure was tolerated N/A N/A Response: well Post Debridement 6.7x1.1x0.2 N/A N/A Measurements L x W x D (cm) Post Debridement 1.158 N/A N/A Volume: (cm) Periwound Skin Texture: Scarring: Yes N/A N/A Excoriation: No Induration: No Callus: No Crepitus: No Rash: No Periwound Skin Maceration: No N/A N/A Moisture: Dry/Scaly: No Periwound Skin Color: Erythema:  Yes N/A N/A Atrophie Blanche: No Cyanosis: No Ecchymosis: No Hemosiderin Staining: No Mottled: No Pallor: No Rubor: No Erythema Location: Circumferential N/A N/A Temperature: No Abnormality N/A N/A Tenderness on Yes N/A N/A Palpation: Wound Preparation: N/A N/A PAIZLEIGH, WILDS ([id number]) Ulcer Cleansing: Rinsed/Irrigated with Saline Topical Anesthetic Applied: Other: lidocaine 4% Procedures Performed: Debridement N/A N/A Treatment Notes Electronic Signature(s) Signed: 07/01/2017 1:59:20 PM By: Christin Fudge MD, FACS Entered By: Christin Fudge on 07/01/2017 13:59:20 Siddiqi, [name] ([id number]) -------------------------------------------------------------------------------- [place] Details Patient Name: [name] Date of Service: 07/01/2017 1:30 PM Medical Record Number: [id number] Patient Account Number: [id number] Date of Birth/Sex: 10-17-1926 (83 y.o. Female) Treating RN: Montey Hora Primary Care Angelik Walls: Lucianne Lei Other Clinician: Referring Anamae Rochelle: Lucianne Lei Treating Aaliyana Fredericks/Extender: Frann Rider in Treatment: 4 Active Inactive ` Abuse / Safety / Falls / Self Care Management Nursing Diagnoses: Potential for falls Goals: Patient will remain injury free related to falls Date Initiated: 05/28/2017 Target Resolution Date: 07/23/2017 Goal Status: Active Interventions: Assess fall risk on admission and as needed Notes: ` Orientation to the Wound Care Program Nursing Diagnoses: Knowledge deficit related to the wound healing center program Goals: Patient/caregiver will verbalize understanding of the [place] [facility] Date Initiated: 05/28/2017 Target Resolution Date: 07/23/2017 Goal Status: Active Interventions: Provide education on orientation to the wound center Notes: ` Wound/Skin Impairment Nursing Diagnoses: Impaired tissue integrity Scarpelli, Beverly P. ([id number]) Goals: Ulcer/skin  breakdown will have a volume reduction of 30% by week 4 Date Initiated: 05/28/2017 Target Resolution Date: 07/23/2017 Goal Status: Active Ulcer/skin breakdown will have a volume reduction of 50% by week 8 Date Initiated: 05/28/2017 Target Resolution Date: 07/23/2017 Goal Status: Active Ulcer/skin breakdown will have a volume reduction of 80% by week 12 Date Initiated: 05/28/2017 Target Resolution Date: 07/23/2017 Goal Status: Active Ulcer/skin breakdown will heal within 14 weeks Date Initiated: 05/28/2017 Target Resolution Date: 07/23/2017 Goal Status: Active Interventions: Assess patient/caregiver ability to obtain necessary supplies Assess patient/caregiver ability to perform ulcer/skin care regimen upon admission and as needed Assess ulceration(s) every visit Notes: Electronic Signature(s) Signed: 07/01/2017 4:43:58 PM By: Montey Hora Entered By: Montey Hora on 07/01/2017 13:41:02 Ilyas, TMHDQQIW P. ([id number]) -------------------------------------------------------------------------------- Pain Assessment Details Patient Name: [name] Date of Service: 07/01/2017 1:30 PM Medical Record Number: [id number] Patient Account Number: [id number] Date of Birth/Sex: [birth year] (83 y.o. Female) Treating RN: Montey Hora Primary Care Ferdinando Lodge: Lucianne Lei Other Clinician: Referring Gatha Mcnulty: Lucianne Lei Treating Halina Asano/Extender: Frann Rider in Treatment: 4 Active Problems Location of Pain Severity and Description of Pain Patient Has Paino No Site Locations Pain Management and Medication Current Pain Management: Notes Topical or injectable lidocaine is offered to patient for acute pain when surgical  debridement is performed. If needed, Patient is instructed to use over the counter pain medication for the following 24-48 hours after debridement. Wound care MDs do not prescribed pain medications. Patient has chronic pain or uncontrolled pain. Patient has been  instructed to make an appointment with their Primary Care Physician for pain management. Electronic Signature(s) Signed: 07/01/2017 4:43:58 PM By: Montey Hora Entered By: Montey Hora on 07/01/2017 13:26:48 [name] ([id number]) -------------------------------------------------------------------------------- Patient/Caregiver Education Details Patient Name: [name] Date of Service: 07/01/2017 1:30 PM Medical Record Number: [id number] Patient Account Number: [id number] Date of Birth/Gender: 08-02-1926 (83 y.o. Female) Treating RN: Montey Hora Primary Care Physician: Lucianne Lei Other Clinician: Referring Physician: Lucianne Lei Treating Physician/Extender: Frann Rider in Treatment: 4 Education Assessment Education Provided To: Patient and Caregiver Education Topics Provided Wound/Skin Impairment: Handouts: Other: wound care as ordered Methods: Demonstration, Explain/Verbal Responses: State content correctly Electronic Signature(s) Signed: 07/01/2017 4:43:58 PM By: Montey Hora Entered By: Montey Hora on 07/01/2017 13:42:19 Tretter, ENIDPOEU P. ([id number]) -------------------------------------------------------------------------------- Wound Assessment Details Patient Name: [name] Date of Service: 07/01/2017 1:30 PM Medical Record Number: [id number] Patient Account Number: [id number] Date of Birth/Sex: Jul 21, 1926 (83 y.o. Female) Treating RN: Montey Hora Primary Care Jye Fariss: Lucianne Lei Other Clinician: Referring Lark Runk: Lucianne Lei Treating Evon Dejarnett/Extender: Frann Rider in Treatment: 4 Wound Status Wound Number: 1 Primary Trauma, Other Etiology: Wound Location: Left Lower Leg - Anterior Wound Open Wounding Event: Trauma Status: Date Acquired: 05/02/2017 Comorbid Anemia, Hypertension, Type II Weeks Of Treatment: 4 History: Diabetes, Osteoarthritis Clustered Wound: No Photos Photo Uploaded By: Montey Hora on 07/01/2017 16:48:08 Wound Measurements Length: (cm) 6.7 % Reduction in Width: (cm) 1.1 % Reduction in Depth: (cm) 0.1 Epithelializat Area: (cm) 5.788 Tunneling: Volume: (cm) 0.579 Undermining: Area: -20.4% Volume: 39.8% ion: None No No Wound Description Full Thickness Without Foul Odor Afte Classification: Exposed Support Structures Slough/Fibrino Diabetic Severity Grade 1 (Wagner): Wound Margin: Flat and Intact Exudate Amount: Medium Exudate Type: Serous Exudate Color: amber r Cleansing: No Yes Wound Bed Granulation Amount: None Present (0%) Exposed Structure Necrotic Amount: Large (67-100%) Fascia Exposed: No Files, Loura P. ([id number]) Necrotic Quality: Adherent Slough Fat Layer (Subcutaneous Tissue) Exposed: Yes Tendon Exposed: No Muscle Exposed: No Joint Exposed: No Bone Exposed: No Periwound Skin Texture Texture Color No Abnormalities Noted: No No Abnormalities Noted: No Callus: No Atrophie Blanche: No Crepitus: No Cyanosis: No Excoriation: No Ecchymosis: No Induration: No Erythema: Yes Rash: No Erythema Location: Circumferential Scarring: Yes Hemosiderin Staining: No Mottled: No Moisture Pallor: No No Abnormalities Noted: No Rubor: No Dry / Scaly: No Maceration: No Temperature / Pain Temperature: No Abnormality Tenderness on Palpation: Yes Wound Preparation Ulcer Cleansing: Rinsed/Irrigated with Saline Topical Anesthetic Applied: Other: lidocaine 4%, Treatment Notes Wound #1 (Left, Anterior Lower Leg) 1. Cleansed with: Clean wound with Normal Saline 2. Anesthetic Topical Lidocaine 4% cream to wound bed prior to debridement 4. Dressing Applied: Santyl Ointment 5. Secondary Dressing Applied Gauze and Kerlix/Conform 7. Secured with Recruitment consultant) Signed: 07/01/2017 4:43:58 PM By: Montey Hora Entered By: Montey Hora on 07/01/2017 13:33:42 Molzahn, [name]  ([id number]) -------------------------------------------------------------------------------- Vitals Details Patient Name: [name] Date of Service: 07/01/2017 1:30 PM Medical Record Number: [id number] Patient Account Number: [id number] Date of Birth/Sex: 09-17-1926 (83 y.o. Female) Treating RN: Montey Hora Primary Care Shalyn Koral: Lucianne Lei Other Clinician: Referring Yaritsa Savarino: Lucianne Lei Treating Dishon Kehoe/Extender: Frann Rider in Treatment: 4 Vital Signs Time Taken: 13:27 Temperature (F): 98.0 Height (in): 65 Pulse (bpm): 82 Weight (  lbs): 131 Respiratory Rate (breaths/min): 16 Body Mass Index (BMI): 21.8 Blood Pressure (mmHg): 156/68 Reference Range: 80 - 120 mg / dl Electronic Signature(s) Signed: 07/01/2017 4:43:58 PM By: Montey Hora Entered By: Montey Hora on 07/01/2017 13:28:39

## 2017-07-03 NOTE — Progress Notes (Signed)
ZAKIYYAH, SAVANNAH ([id number]) Visit Report for 07/01/2017 Chief Complaint Document Details Patient Name: [name], [name]. Date of Service: 07/01/2017 1:30 PM Medical Record Number: [id number] Patient Account Number: [id number] Date of Birth/Sex: Sep 24, 1926 (83 y.o. Female) Treating RN: Montey Hora Primary Care Provider: Lucianne Lei Other Clinician: Referring Provider: Lucianne Lei Treating Provider/Extender: Frann Rider in Treatment: 4 Information Obtained from: Patient Chief Complaint Patients presents for treatment of an open diabetic ulcer 2 the left lower anterior leg which she sustained an injury by blunt trauma Electronic Signature(s) Signed: 07/01/2017 1:59:38 PM By: Christin Fudge MD, FACS Entered By: Christin Fudge on 07/01/2017 13:59:37 Buffa, GBTDVVOH [name]. ([id number]) -------------------------------------------------------------------------------- Debridement Details Patient Name: [name]. Date of Service: 07/01/2017 1:30 PM Medical Record Number: [id number] Patient Account Number: [id number] Date of Birth/Sex: 04-06-1926 (83 y.o. Female) Treating RN: Montey Hora Primary Care Provider: Lucianne Lei Other Clinician: Referring Provider: Lucianne Lei Treating Provider/Extender: Frann Rider in Treatment: 4 Debridement Performed for Wound #1 Left,Anterior Lower Leg Assessment: Performed By: Physician Christin Fudge, MD Debridement: Debridement Pre-procedure Verification/Time Out Yes - 13:44 Taken: Start Time: 13:44 Pain Control: Lidocaine 4% Topical Solution Level: Skin/Subcutaneous Tissue Total Area Debrided (L x 6.7 (cm) x 1.1 (cm) = 7.37 (cm) W): Tissue and other Viable, Non-Viable, Fibrin/Slough, Subcutaneous material debrided: Instrument: Curette Bleeding: Minimum Hemostasis Achieved: Pressure End Time: 13:46 Procedural Pain: 0 Post Procedural Pain: 0 Response to Treatment: Procedure was tolerated well Post Debridement  Measurements of Total Wound Length: (cm) 6.7 Width: (cm) 1.1 Depth: (cm) 0.2 Volume: (cm) 1.158 Character of Wound/Ulcer Post Improved Debridement: Post Procedure Diagnosis Same as Pre-procedure Electronic Signature(s) Signed: 07/01/2017 1:59:31 PM By: Christin Fudge MD, FACS Signed: 07/01/2017 4:43:58 PM By: Montey Hora Entered By: Christin Fudge on 07/01/2017 13:59:31 Cocking, Yenty [name]. ([id number]) -------------------------------------------------------------------------------- HPI Details Patient Name: [name]. Date of Service: 07/01/2017 1:30 PM Medical Record Number: [id number] Patient Account Number: [id number] Date of Birth/Sex: [birth year] (83 y.o. Female) Treating RN: Montey Hora Primary Care Provider: Lucianne Lei Other Clinician: Referring Provider: Lucianne Lei Treating Provider/Extender: Frann Rider in Treatment: 4 History of Present Illness Location: left anterior shin Quality: Patient reports experiencing a dull pain to affected area(s). Severity: Patient states wound are getting worse. Duration: Patient has had the wound for < 3 weeks prior to presenting for treatment Timing: Pain in wound is Intermittent (comes and goes Context: The wound occurred when the patient blunt trauma against her left shin Modifying Factors: Other treatment(s) tried include:active Bactroban ointment locally Associated Signs and Symptoms: Patient reports having: occasional discharge from the wound HPI Description: 83 year old patient was seen in the ED about 10 days ago for a history of abrasion to the left lower extremity while she was boarding a bus and scraped the anterior part of her left leg. She has been a diabetic for many years and has been taking treatment regularly. Her past medical history is also significant for anemia arthritis constipation and hypertension and is status post abdominal hysterectomy. She has never been a smoker. after her ER visit she was  advised to apply Bactroban ointment to the wound twice a day and continue to monitor her blood glucose levels. her last hemoglobin A1c was done 3 years ago and was 6.6% 06/04/17 patient left anterior shin wound appears to be doing well although it is still somewhat dry despite the treatment with Medihoney. We have been using Kerlex following the Medihoney application and I believe this is just not retaining that much moisture. Nonetheless  there is no evidence of infection. 06/11/17 on evaluation today patient appears to be doing a little bit worse in regard to her wound. The entirety of the wound is dry and unfortunately the Medihoney does not seem to be helping this. That is even with the dressing changes that I made last week to try to retain more moisture. She has not tried [employer] as of yet. She was also noncompressible when testing for ABIs. 07/01/2017 -- she had a arterial studies done and was seen by Dr. Lucky Cowboy. her noninvasive studies showed noncompressible vessels on the right but brisk waveforms and digital pressures of 71 on the right consistent with only mild arterial insufficienc. On the left her ABI was 0.47 and this may be falsely elevated due to calcification. No digital pressures were obtained on the left and this is consistent with severe arterial insufficiency. this critical limb threatening situation on the left, would make wound healing difficult and it represents a serious situation and he has recommended an angiography with possible revascularization. The patient desired to defer the decision to she discuss with the family members. Electronic Signature(s) Signed: 07/01/2017 2:05:06 PM By: Christin Fudge MD, FACS Entered By: Christin Fudge on 07/01/2017 14:05:06 [name] ([id number]) -------------------------------------------------------------------------------- Physical Exam Details Patient Name: [name]. Date of Service: 07/01/2017 1:30 PM Medical Record  Number: [id number] Patient Account Number: [id number] Date of Birth/Sex: 10-Feb-1926 (83 y.o. Female) Treating RN: Montey Hora Primary Care Provider: Lucianne Lei Other Clinician: Referring Provider: Lucianne Lei Treating Provider/Extender: Frann Rider in Treatment: 4 Constitutional . Pulse regular. Respirations normal and unlabored. Afebrile. . Eyes Nonicteric. Reactive to light. Ears, Nose, Mouth, and Throat Lips, teeth, and gums WNL.Marland Kitchen Moist mucosa without lesions. Neck supple and nontender. No palpable supraclavicular or cervical adenopathy. Normal sized without goiter. Respiratory WNL. No retractions.. Cardiovascular Pedal Pulses WNL. No clubbing, cyanosis or edema. Lymphatic No adneopathy. No adenopathy. No adenopathy. Musculoskeletal Adexa without tenderness or enlargement.. Digits and nails w/o clubbing, cyanosis, infection, petechiae, ischemia, or inflammatory conditions.. Integumentary (Hair, Skin) No suspicious lesions. No crepitus or fluctuance. No peri-wound warmth or erythema. No masses.Marland Kitchen Psychiatric Judgement and insight Intact.. No evidence of depression, anxiety, or agitation.. Notes the wound continues to have a lot of slough and I have used a #3 curet to sharply remove this to the best of my ability. Electronic Signature(s) Signed: 07/01/2017 2:05:47 PM By: Christin Fudge MD, FACS Entered By: Christin Fudge on 07/01/2017 14:05:46 [name] ([id number]) -------------------------------------------------------------------------------- Physician Orders Details Patient Name: [name]. Date of Service: 07/01/2017 1:30 PM Medical Record Number: [id number] Patient Account Number: [id number] Date of Birth/Sex: 05-Aug-1926 (83 y.o. Female) Treating RN: Montey Hora Primary Care Provider: Lucianne Lei Other Clinician: Referring Provider: Lucianne Lei Treating Provider/Extender: Frann Rider in Treatment: 4 Verbal / Phone Orders:  No Diagnosis Coding Wound Cleansing Wound #1 Left,Anterior Lower Leg o Clean wound with Normal Saline. o May Shower, gently pat wound dry prior to applying new dressing. Anesthetic Wound #1 Left,Anterior Lower Leg o Topical Lidocaine 4% cream applied to wound bed prior to debridement Primary Wound Dressing Wound #1 Left,Anterior Lower Leg o Santyl Ointment Secondary Dressing Wound #1 Left,Anterior Lower Leg o Boardered Foam Dressing Dressing Change Frequency Wound #1 Left,Anterior Lower Leg o Change dressing every day. Follow-up Appointments Wound #1 Left,Anterior Lower Leg o Return Appointment in 1 week. Edema Control Wound #1 Left,Anterior Lower Leg o Elevate legs to the level of the heart and pump ankles as often as possible  Additional Orders / Instructions Wound #1 Left,Anterior Lower Leg o Increase protein intake. o Other: - Please add vitamin A, vitamin C and zinc supplements to your diet ALESA, ECHEVARRIA ([id number]) Electronic Signature(s) Signed: 07/01/2017 4:24:31 PM By: Christin Fudge MD, FACS Signed: 07/01/2017 4:43:58 PM By: Montey Hora Entered By: Montey Hora on 07/01/2017 13:47:02 Gfeller, [name] ([id number]) -------------------------------------------------------------------------------- Problem List Details Patient Name: [name]. Date of Service: 07/01/2017 1:30 PM Medical Record Number: [id number] Patient Account Number: [id number] Date of Birth/Sex: February 07, 1926 (83 y.o. 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 Date of Service: 07/01/2017 1:30 PM Medical Record Number: [id number] Patient Account Number: [id number] Date of Birth/Sex: November 17, 1926 (83 y.o. Female) Treating RN: Montey Hora Primary Care Provider: Lucianne Lei Other Clinician: Referring Provider: Lucianne Lei Treating Provider/Extender: Frann Rider in Treatment: 4 Subjective Chief Complaint Information obtained from Patient Patients presents for treatment of an open diabetic ulcer 2 the left lower anterior leg which she sustained an injury by blunt trauma History of Present Illness (HPI) The following HPI elements were documented for the patient's wound: Location: left anterior shin Quality: Patient reports experiencing a dull pain to affected area(s). Severity: Patient states wound are getting worse. Duration: Patient has had the wound for < 3 weeks prior to presenting for treatment Timing: Pain in wound is Intermittent (comes and goes Context: The wound occurred when the patient blunt trauma against her left shin Modifying Factors: Other treatment(s) tried include:active Bactroban ointment locally Associated Signs and Symptoms: Patient reports having: occasional discharge from the wound 83 year old patient was seen in the ED about 10 days ago for a history of abrasion to the left lower extremity while she was boarding a bus and scraped the anterior part of her left leg. She has been a diabetic for many years and has been taking treatment regularly. Her past medical history is also significant for anemia arthritis constipation and  hypertension and is status post abdominal hysterectomy. She has never been a smoker. after her ER visit she was advised to apply Bactroban ointment to the wound twice a day and continue to monitor her blood glucose levels. her last hemoglobin A1c was done 3 years ago and was 6.6% 06/04/17 patient left anterior shin wound appears to be doing well although it is still somewhat dry despite the treatment with Medihoney. We have been using Kerlex following the Medihoney application and I believe this is just not retaining that much moisture. Nonetheless there is no evidence of infection. 06/11/17 on evaluation today patient appears to be doing a little bit worse in regard to her wound. The entirety of the wound is dry and unfortunately the Medihoney does not seem to be helping this. That is even with the dressing changes that I made last week to try to retain more moisture. She has not tried [employer] as of yet. She was also noncompressible when testing for ABIs. 07/01/2017 -- she  had a arterial studies done and was seen by Dr. Lucky Cowboy. her noninvasive studies showed noncompressible vessels on the right but brisk waveforms and digital pressures of 71 on the right consistent with only mild arterial insufficienc. On the left her ABI was 0.47 and this may be falsely elevated due to calcification. No digital pressures were obtained on the left and this is consistent with severe arterial insufficiency. this critical limb threatening situation on the left, would make wound healing difficult and it Stepanian, Burnis [name]. ([id number]) represents a serious situation and he has recommended an angiography with possible revascularization. The patient desired to defer the decision to she discuss with the family members. Objective Constitutional Pulse regular. Respirations normal and unlabored. Afebrile. Vitals Time Taken: 1:27 PM, Height: 65 in, Weight: 131 lbs, BMI: 21.8, Temperature: 98.0 F, Pulse: 82 bpm, Respiratory  Rate: 16 breaths/min, Blood Pressure: 156/68 mmHg. Eyes Nonicteric. Reactive to light. Ears, Nose, Mouth, and Throat Lips, teeth, and gums WNL.Marland Kitchen Moist mucosa without lesions. Neck supple and nontender. No palpable supraclavicular or cervical adenopathy. Normal sized without goiter. Respiratory WNL. No retractions.. Cardiovascular Pedal Pulses WNL. No clubbing, cyanosis or edema. Lymphatic No adneopathy. No adenopathy. No adenopathy. Musculoskeletal Adexa without tenderness or enlargement.. Digits and nails w/o clubbing, cyanosis, infection, petechiae, ischemia, or inflammatory conditions.Marland Kitchen Psychiatric Judgement and insight Intact.. No evidence of depression, anxiety, or agitation.. General Notes: the wound continues to have a lot of slough and I have used a #3 curet to sharply remove this to the best of my ability. Integumentary (Hair, Skin) Arthurs, Keli [name]. ([id number]) No suspicious lesions. No crepitus or fluctuance. No peri-wound warmth or erythema. No masses.. Wound #1 status is Open. Original cause of wound was Trauma. The wound is located on the Left,Anterior Lower Leg. The wound measures 6.7cm length x 1.1cm width x 0.1cm depth; 5.788cm^2 area and 0.579cm^3 volume. There is Fat Layer (Subcutaneous Tissue) Exposed exposed. There is no tunneling or undermining noted. There is a medium amount of serous drainage noted. The wound margin is flat and intact. There is no granulation within the wound bed. There is a large (67-100%) amount of necrotic tissue within the wound bed including Adherent Slough. The periwound skin appearance exhibited: Scarring, Erythema. The periwound skin appearance did not exhibit: Callus, Crepitus, Excoriation, Induration, Rash, Dry/Scaly, Maceration, Atrophie Blanche, Cyanosis, Ecchymosis, Hemosiderin Staining, Mottled, Pallor, Rubor. The surrounding wound skin color is noted with erythema which is circumferential. Periwound temperature was noted as No  Abnormality. The periwound has tenderness on palpation. 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 There was a Skin/Subcutaneous Tissue Debridement ([id number]) debridement with total area of 7.37 sq cm performed by Christin Fudge, MD. with the following instrument(s): Curette to remove Viable and Non-Viable tissue/material including Fibrin/Slough and Subcutaneous after achieving pain control using Lidocaine 4% Topical Solution. A time out was conducted at 13:44, prior to the start of the procedure. A Minimum amount of bleeding was controlled with Pressure. The procedure was tolerated well with a pain level of 0 throughout and a pain level of 0 following the procedure. Post Debridement Measurements: 6.7cm length x 1.1cm width x 0.2cm depth; 1.158cm^3 volume. Character of Wound/Ulcer Post Debridement is improved. Post procedure Diagnosis  Wound #1: Same as Pre-Procedure Davlin, Kisa [name]. ([id number]) Plan Wound Cleansing: Wound #1 Left,Anterior Lower Leg: Clean wound with Normal Saline. May Shower, gently pat wound dry prior to applying new dressing. Anesthetic: Wound #1 Left,Anterior Lower Leg: Topical Lidocaine 4% cream applied to wound bed prior to debridement Primary Wound Dressing: Wound #1 Left,Anterior Lower Leg: Santyl Ointment Secondary Dressing: Wound #1 Left,Anterior Lower Leg: Boardered Foam Dressing Dressing Change Frequency: Wound #1 Left,Anterior Lower Leg: Change dressing every day. Follow-up Appointments: Wound #1 Left,Anterior Lower Leg: Return Appointment in 1 week. Edema Control: Wound #1 Left,Anterior Lower Leg: Elevate legs to the level of  the heart and pump ankles as often as possible Additional Orders / Instructions: Wound #1 Left,Anterior Lower Leg: Increase protein intake. Other: - Please add vitamin A, vitamin C and zinc supplements to your diet after review and sharp debridement today, I have recommended: 1. Daily washing with soap and water and application of Santyl ointment locally with a light Kerlix dressing. 2. good control of her diabetes mellitus 3. Adequate protein, vitamin A, vitamin C and zinc 4. keep her appointment for a arterial intervention as soon as possible. I have discussed the consultation of Dr. Lucky Cowboy at length and have highly recommended they follow up with arterial intervention as recommended by him 5. Regular visits the wound center Electronic Signature(s) Signed: 07/01/2017 2:08:01 PM By: Christin Fudge MD, FACS Dawson, [name] ([id number]) Entered By: Christin Fudge on 07/01/2017 14:08:01 Laduca, [name] ([id number]) -------------------------------------------------------------------------------- SuperBill Details Patient Name: [name]. Date of Service: 07/01/2017 Medical Record Number: [id number] Patient Account Number: [id number] Date of Birth/Sex: 05-05-1926 (83 y.o. 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 Facility Procedures CPT4 Code Description: [id number] 11042 - DEB SUBQ TISSUE 20 SQ CM/< ICD-10 Description Diagnosis E11.622 Type 2 diabetes mellitus with other skin ulcer L97.222 Non-pressure chronic ulcer of left calf with fat layer e X41.287 Atherosclerosis of native  arteries  of left leg with ulce Modifier: xposed ration of ca Quantity: 1 lf Physician Procedures CPT4 Code Description: [id number] 94709 - WC PHYS LEVEL 3 - EST PT ICD-10 Description Diagnosis E11.622 Type 2 diabetes mellitus with other skin ulcer L97.222 Non-pressure chronic ulcer of left calf with fat layer e G28.366 Atherosclerosis 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

## 2017-07-09 ENCOUNTER — Encounter (INDEPENDENT_AMBULATORY_CARE_PROVIDER_SITE_OTHER): Payer: Medicare Other | Admitting: Vascular Surgery

## 2017-07-09 ENCOUNTER — Encounter: Payer: Medicare Other | Admitting: Physician Assistant

## 2017-07-09 ENCOUNTER — Encounter (INDEPENDENT_AMBULATORY_CARE_PROVIDER_SITE_OTHER): Payer: Self-pay

## 2017-07-09 DIAGNOSIS — E11622 Type 2 diabetes mellitus with other skin ulcer: Secondary | ICD-10-CM | POA: Diagnosis not present

## 2017-07-12 NOTE — Progress Notes (Signed)
[name], [name] ([id number]) Visit Report for 07/09/2017 Chief Complaint Document Details Patient Name: [name], [name] Date of Service: 07/09/2017 2:30 PM Medical Record Number: [id number] Patient Account Number: [id number] Date of Birth/Sex: [birth year] (83 y.o. Female) Treating RN: Montey Hora Primary Care Provider: Lucianne Lei Other Clinician: Referring Provider: Lucianne Lei Treating Provider/Extender: Melburn Hake, [name] Weeks in Treatment: 6 Information Obtained from: Patient Chief Complaint Patients presents for treatment of an open diabetic ulcer 2 the left lower anterior leg which she sustained an injury by blunt trauma Electronic Signature(s) Signed: 07/12/2017 8:34:49 AM By: Worthy Keeler PA-C Entered By: Worthy Keeler on 07/09/2017 15:08:35 [name], [name] ([id number]) -------------------------------------------------------------------------------- HPI Details Patient Name: Alexa Lowes P. Date of Service: 07/09/2017 2:30 PM Medical Record Number: [id number] Patient Account Number: [id number] Date of Birth/Sex: 01/29/1926 (83 y.o. Female) Treating RN: Montey Hora Primary Care Provider: Lucianne Lei Other Clinician: Referring Provider: Lucianne Lei Treating Provider/Extender: Melburn Hake, [name] Weeks in Treatment: 6 History of Present Illness Location: left anterior shin Quality: Patient reports experiencing a dull pain to affected area(s). Severity: Patient states wound are getting worse. Duration: Patient has had the wound for < 3 weeks prior to presenting for treatment Timing: Pain in wound is Intermittent (comes and goes Context: The wound occurred when the patient blunt trauma against her left shin Modifying Factors: Other treatment(s) tried include:active Bactroban ointment locally Associated Signs and Symptoms: Patient reports having: occasional discharge from the wound HPI Description: 83 year old patient was seen in the ED about 10 days ago  for a history of abrasion to the left lower extremity while she was boarding a bus and scraped the anterior part of her left leg. She has been a diabetic for many years and has been taking treatment regularly. Her past medical history is also significant for anemia arthritis constipation and hypertension and is status post abdominal hysterectomy. She has never been a smoker. after her ER visit she was advised to apply Bactroban ointment to the wound twice a day and continue to monitor her blood glucose levels. her last hemoglobin A1c was done 3 years ago and was 6.6% 06/04/17 patient left anterior shin wound appears to be doing well although it is still somewhat dry despite the treatment with Medihoney. We have been using Kerlex following the Medihoney application and I believe this is just not retaining that much moisture. Nonetheless there is no evidence of infection. 06/11/17 on evaluation today patient appears to be doing a little bit worse in regard to her wound. The entirety of the wound is dry and unfortunately the Medihoney does not seem to be helping this. That is even with the dressing changes that I made last week to try to retain more moisture. She has not tried [employer] as of yet. She was also noncompressible when testing for ABIs. 07/01/2017 -- she had a arterial studies done and was seen by Dr. Lucky Cowboy. her noninvasive studies showed noncompressible vessels on the right but brisk waveforms and digital pressures of 71 on the right consistent with only mild arterial insufficienc. On the left her ABI was 0.47 and this may be falsely elevated due to calcification. No digital pressures were obtained on the left and this is consistent with severe arterial insufficiency. this critical limb threatening situation on the left, would make wound healing difficult and it represents a serious situation and he has recommended an angiography with possible revascularization. The patient desired to defer  the decision to she discuss with the family members. 07/09/17  on evaluation today patient appears to be doing fairly well in regard to her left anterior lower extremity wound. She has been tolerating the dressing changes without complication we have been utilizing Santyl at this time. She tells me that she is having no significant pain compared to what she has had in the past. She has decided after discussing with family that she is going to go forward with the surgery with Dr. dew. This is to restore better blood flow to left lower extremity. With that it does appear that her lower [name], [name] ([id number]) extremity wound is making some progress albeit slow. No fevers, chills, nausea, or vomiting noted at this time. Electronic Signature(s) Signed: 07/12/2017 8:34:49 AM By: Worthy Keeler PA-C Entered By: Worthy Keeler on 07/09/2017 15:17:11 [name], [name] ([id number]) -------------------------------------------------------------------------------- Physical Exam Details Patient Name: Alexa Lowes P. Date of Service: 07/09/2017 2:30 PM Medical Record Number: [id number] Patient Account Number: [id number] Date of Birth/Sex: 22-Jun-1926 (83 y.o. Female) Treating RN: Montey Hora Primary Care Provider: Lucianne Lei Other Clinician: Referring Provider: Lucianne Lei Treating Provider/Extender: STONE III, Alita Waldren Weeks in Treatment: 6 Constitutional Well-nourished and well-hydrated in no acute distress. Respiratory normal breathing without difficulty. clear to auscultation bilaterally. Cardiovascular regular rate and rhythm with normal S1, S2. Psychiatric this patient is able to make decisions and demonstrates good insight into disease process. Alert and Oriented x 3. pleasant and cooperative. Notes Patient's wounds still have slough covering the wound bed although there was no evidence of infection area erythema surrounding the wound. No debridement was performed today. This  is secondary to her ABI and vascular studies. Electronic Signature(s) Signed: 07/12/2017 8:34:49 AM By: Worthy Keeler PA-C Entered By: Worthy Keeler on 07/09/2017 15:17:52 [name], [name] ([id number]) -------------------------------------------------------------------------------- Physician Orders Details Patient Name: Alexa Lowes P. Date of Service: 07/09/2017 2:30 PM Medical Record Number: [id number] Patient Account Number: [id number] Date of Birth/Sex: 02/04/1926 (83 y.o. 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 o May Shower, gently pat wound dry prior to applying new dressing. Anesthetic Wound #1 Left,Anterior Lower Leg o Topical Lidocaine 4% cream applied to wound bed prior to debridement Primary Wound Dressing Wound #1 Left,Anterior Lower Leg o Santyl Ointment Secondary Dressing Wound #1 Left,Anterior Lower Leg o Boardered Foam Dressing Dressing Change Frequency Wound #1 Left,Anterior Lower Leg o Change dressing every day. Follow-up Appointments Wound #1 Left,Anterior Lower Leg o Return Appointment in 1 week. Edema Control Wound #1 Left,Anterior Lower Leg o Elevate legs to the level of the heart and pump ankles as often as possible [name], [name] ([id number]) Additional Orders / Instructions Wound #1 Left,Anterior Lower Leg o Increase protein intake. o Other: - Please add vitamin A, vitamin C and zinc supplements to your  diet Patient Medications Allergies: No Known Drug Allergies Notifications Medication Indication Start End Santyl 07/09/2017 DOSE topical 250 unit/gram ointment - ointment topical applied nickel thick to the wound bed of the left leg and then cover with dressing daily Notes I'm going to recommend that we continue with the Santyl for the time being  another the next week I will see were things stand at that point. If anything worsens in the interim patient or family will contact us for additional recommendations. Otherwise we will see were at that point. Electronic Signature(s) Signed: 07/09/2017 3:19:28 PM By: Worthy Keeler PA-C Entered By: Worthy Keeler on 07/09/2017 15:19:28 Heather, [name] ([id number]) -------------------------------------------------------------------------------- Problem List Details Patient Name: Alexa Lowes P. Date of Service: 07/09/2017 2:30 PM Medical Record Number: [id number] Patient Account Number: [id number] Date of Birth/Sex: 1926-03-10 (83 y.o. 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 ([id number]) -------------------------------------------------------------------------------- Progress Note Details Patient Name: Alexa Lowes P. Date of Service: 07/09/2017 2:30 PM Medical Record Number:  [id number] Patient Account Number: [id number] Date of Birth/Sex: 12/25/1925 (83 y.o. Female) Treating RN: Montey Hora Primary Care Provider: Lucianne Lei Other Clinician: Referring Provider: Lucianne Lei Treating Provider/Extender: Melburn Hake, Kortez Murtagh Weeks in Treatment: 6 Subjective Chief Complaint Information obtained from Patient Patients presents for treatment of an open diabetic ulcer 2 the left lower anterior leg which she sustained an injury by blunt trauma History of Present Illness (HPI) The following HPI elements were documented for the patient's wound: Location: left anterior shin Quality: Patient reports experiencing a dull pain to affected area(s). Severity: Patient states wound are getting worse. Duration: Patient has had the wound for < 3 weeks prior to presenting for treatment Timing: Pain in wound is Intermittent (comes and goes Context: The wound occurred when the patient blunt trauma against her left shin Modifying Factors: Other treatment(s) tried include:active Bactroban ointment locally Associated Signs and Symptoms: Patient reports having: occasional discharge from the wound 83 year old patient was seen in the ED about 10 days ago for a history of abrasion to the left lower extremity while she was boarding a bus and scraped the anterior part of her left leg. She has been a diabetic for many years and has been taking treatment regularly. Her past medical history is also significant for anemia arthritis constipation and hypertension and is status post abdominal hysterectomy. She has never been a smoker. after her ER visit she was advised to apply Bactroban ointment to the wound twice a day and continue to monitor her blood glucose levels. her last hemoglobin A1c was done 3 years ago and was 6.6% 06/04/17 patient left anterior shin wound appears to be doing well although it is still somewhat dry despite the treatment with Medihoney. We have been using Kerlex following  the Medihoney application and I believe this is just not retaining that much moisture. Nonetheless there is no evidence of infection. 06/11/17 on evaluation today patient appears to be doing a little bit worse in regard to her wound. The entirety of the wound is dry and unfortunately the Medihoney does not seem to be helping this. That is even with the dressing changes that I made last week to try to retain more moisture. She has not tried [employer] as of yet. She was also noncompressible when testing for ABIs. 07/01/2017 --  she had a arterial studies done and was seen by Dr. Lucky Cowboy. her noninvasive studies showed noncompressible vessels on the right but brisk waveforms and digital pressures of 71 on the right consistent with only mild arterial insufficienc. On the left her ABI was 0.47 and this may be falsely elevated due to calcification. No digital pressures were obtained on the left and this is consistent with severe arterial insufficiency. this critical limb threatening situation on the left, would make wound healing difficult and it [name], [name] ([id number]) represents a serious situation and he has recommended an angiography with possible revascularization. The patient desired to defer the decision to she discuss with the family members. 07/09/17 on evaluation today patient appears to be doing fairly well in regard to her left anterior lower extremity wound. She has been tolerating the dressing changes without complication we have been utilizing Santyl at this time. She tells me that she is having no significant pain compared to what she has had in the past. She has decided after discussing with family that she is going to go forward with the surgery with Dr. dew. This is to restore better blood flow to left lower extremity. With that it does appear that her lower extremity wound is making some progress albeit slow. No fevers, chills, nausea, or vomiting noted at  this time. Objective Constitutional Well-nourished and well-hydrated in no acute distress. Vitals Time Taken: 2:52 PM, Height: 65 in, Weight: 131 lbs, BMI: 21.8, Temperature: 98.2 F, Pulse: 87 bpm, Respiratory Rate: 16 breaths/min, Blood Pressure: 165/69 mmHg. Respiratory normal breathing without difficulty. clear to auscultation bilaterally. Cardiovascular regular rate and rhythm with normal S1, S2. Psychiatric this patient is able to make decisions and demonstrates good insight into disease process. Alert and Oriented x 3. pleasant and cooperative. General Notes: Patient's wounds still have slough covering the wound bed although there was no evidence of infection area erythema surrounding the wound. No debridement was performed today. This is secondary to her ABI and vascular studies. Integumentary (Hair, Skin) Wound #1 status is Open. Original cause of wound was Trauma. The wound is located on the Left,Anterior Lower Leg. The wound measures 6.5cm length x 1.1cm width x 0.1cm depth; 5.616cm^2 area and 0.562cm^3 volume. There is Fat Layer (Subcutaneous Tissue) Exposed exposed. There is no tunneling or undermining noted. There is a medium amount of serous drainage noted. The wound margin is flat and intact. There is no granulation within the wound bed. There is a large (67-100%) amount of necrotic tissue within the wound bed including Adherent Slough. The periwound skin appearance exhibited: Scarring, Jacquet, Kaybree P. ([id number]) Erythema. The periwound skin appearance did not exhibit: Callus, Crepitus, Excoriation, Induration, Rash, Dry/Scaly, Maceration, Atrophie Blanche, Cyanosis, Ecchymosis, Hemosiderin Staining, Mottled, Pallor, Rubor. The surrounding wound skin color is noted with erythema which is circumferential. Periwound temperature was noted as No Abnormality. The periwound has tenderness on palpation. 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 May Shower, gently pat wound dry prior to applying new dressing. Anesthetic: Wound #1 Left,Anterior Lower Leg: Topical Lidocaine 4% cream applied to wound bed prior to debridement Primary Wound Dressing: Wound #1 Left,Anterior Lower Leg: Santyl Ointment Secondary Dressing: Wound #1 Left,Anterior Lower Leg: Boardered Foam Dressing Dressing Change  Frequency: Wound #1 Left,Anterior Lower Leg: Change dressing every day. Follow-up Appointments: Wound #1 Left,Anterior Lower Leg: Return Appointment in 1 week. Edema Control: Wound #1 Left,Anterior Lower Leg: Elevate legs to the level of the heart and pump ankles as often as possible Additional Orders / Instructions: Wound #1 Left,Anterior Lower Leg: Increase protein intake. Other: - Please add vitamin A, vitamin C and zinc supplements to your diet [name], [name] ([id number]) The following medication(s) was prescribed: Santyl topical 250 unit/gram ointment ointment topical applied nickel thick to the wound bed of the left leg and then cover with dressing daily starting 07/09/2017 General Notes: I'm going to recommend that we continue with the Santyl for the time being another the next week I will see were things stand at that point. If anything worsens in the interim patient or family will contact us for additional recommendations. Otherwise we will see were at that point. Electronic Signature(s) Signed: 07/12/2017 8:34:49 AM By: Worthy Keeler PA-C Entered By: Worthy Keeler on 07/09/2017 15:19:35 [name], [name] ([id number]) -------------------------------------------------------------------------------- SuperBill Details Patient Name: Alexa Lowes  P. Date of Service: 07/09/2017 Medical Record Number: [id number] Patient Account Number: [id number] Date of Birth/Sex: 15-May-1926 (83 y.o. 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 Facility Procedures CPT4 Code: [id number] Description: 99213 - WOUND CARE VISIT-LEV 3 EST PT Modifier: Quantity: 1 Physician Procedures CPT4 Code Description: [id number] 99213 - WC PHYS LEVEL 3 - EST PT ICD-10 Description Diagnosis E11.622 Type 2 diabetes mellitus with other skin ulcer L97.222 Non-pressure chronic ulcer of left calf with fat layer e A76.811 Atherosclerosis of native  arteries of left leg with ulce Modifier: xposed ration of ca Quantity: 1 lf Electronic Signature(s) Signed: 07/09/2017 4:53:31 PM By: Montey Hora Signed: 07/12/2017 8:34:49 AM By: Worthy Keeler PA-C Entered By: Montey Hora on 07/09/2017 16:53:31

## 2017-07-12 NOTE — Progress Notes (Signed)
KIANI, WURTZEL ([id number]) Visit Report for 07/09/2017 Arrival Information Details Patient Name: NESIAH, JUMP P. Date of Service: 07/09/2017 2:30 PM Medical Record Number: [id number] Patient Account Number: [id number] Date of Birth/Sex: [birth year] (83 y.o. Female) Treating RN: Montey Hora Primary Care Calina Patrie: Lucianne Lei Other Clinician: Referring Desmon Hitchner: Lucianne Lei Treating Gracen Ringwald/Extender: Melburn Hake, HOYT Weeks in Treatment: 6 Visit Information History Since Last Visit Added or deleted any medications: No Patient Arrived: Cane Any new allergies or adverse reactions: No Arrival Time: 14:50 Had a fall or experienced change in No Accompanied By: dtr activities of daily living that may affect Transfer Assistance: None risk of falls: Patient Identification Verified: Yes Signs or symptoms of abuse/neglect since last No Secondary Verification Process Completed: Yes visito Patient Has Alerts: Yes Hospitalized since last visit: No Has Dressing in Place as Prescribed: Yes Pain Present Now: No Electronic Signature(s) Signed: 07/09/2017 5:10:53 PM By: Montey Hora Entered By: Montey Hora on 07/09/2017 14:50:58 Kostelecky, OBSJGGEZ P. ([id number]) -------------------------------------------------------------------------------- Clinic Level of Care Assessment Details Patient Name: [name] Date of Service: 07/09/2017 2:30 PM Medical Record Number: [id number] Patient Account Number: [id number] Date of Birth/Sex: 1926-10-31 (83 y.o. Female) Treating RN: Montey Hora Primary Care Colm Lyford: Lucianne Lei Other Clinician: Referring Haely Leyland: Lucianne Lei Treating Chaim Gatley/Extender: Melburn Hake, HOYT Weeks in Treatment: 6 Clinic Level of Care Assessment Items TOOL 4 Quantity Score []  - Use when only an EandM is performed on FOLLOW-UP visit 0 ASSESSMENTS - Nursing Assessment / Reassessment X - Reassessment of Co-morbidities (includes updates in patient status)  1 10 X - Reassessment of Adherence to Treatment Plan 1 5 ASSESSMENTS - Wound and Skin Assessment / Reassessment X - Simple Wound Assessment / Reassessment - one wound 1 5 []  - Complex Wound Assessment / Reassessment - multiple wounds 0 []  - Dermatologic / Skin Assessment (not related to wound area) 0 ASSESSMENTS - Focused Assessment []  - Circumferential Edema Measurements - multi extremities 0 []  - Nutritional Assessment / Counseling / Intervention 0 X - Lower Extremity Assessment (monofilament, tuning fork, pulses) 1 5 []  - Peripheral Arterial Disease Assessment (using hand held doppler) 0 ASSESSMENTS - Ostomy and/or Continence Assessment and Care []  - Incontinence Assessment and Management 0 []  - Ostomy Care Assessment and Management (repouching, etc.) 0 PROCESS - Coordination of Care X - Simple Patient / Family Education for ongoing care 1 15 []  - Complex (extensive) Patient / Family Education for ongoing care 0 []  - Staff obtains Programmer, systems, Records, Test Results / Process Orders 0 []  - Staff telephones HHA, Nursing Homes / Clarify orders / etc 0 []  - Routine Transfer to another Facility (non-emergent condition) 0 Tiedeman, Sukhman P. ([id number]) []  - Routine Hospital Admission (non-emergent condition) 0 []  - New Admissions / Biomedical engineer / Ordering NPWT, Apligraf, etc. 0 []  - Emergency Hospital Admission (emergent condition) 0 X - Simple Discharge Coordination 1 10 []  - Complex (extensive) Discharge Coordination 0 PROCESS - Special Needs []  - Pediatric / Minor Patient Management 0 []  - Isolation Patient Management 0 []  - Hearing / Language / Visual special needs 0 []  - Assessment of Community assistance (transportation, D/C planning, etc.) 0 []  - Additional assistance / Altered mentation 0 []  - Support Surface(s) Assessment (bed, cushion, seat, etc.) 0 INTERVENTIONS - Wound Cleansing / Measurement X - Simple Wound Cleansing - one wound 1 5 []  - Complex Wound  Cleansing - multiple wounds 0 X - Wound Imaging (photographs - any number of wounds) 1 5 []  - Wound Tracing (instead  of photographs) 0 X - Simple Wound Measurement - one wound 1 5 []  - Complex Wound Measurement - multiple wounds 0 INTERVENTIONS - Wound Dressings X - Small Wound Dressing one or multiple wounds 1 10 []  - Medium Wound Dressing one or multiple wounds 0 []  - Large Wound Dressing one or multiple wounds 0 X - Application of Medications - topical 1 5 []  - Application of Medications - injection 0 INTERVENTIONS - Miscellaneous []  - External ear exam 0 Kerrigan, Fatim P. ([id number]) []  - Specimen Collection (cultures, biopsies, blood, body fluids, etc.) 0 []  - Specimen(s) / Culture(s) sent or taken to Lab for analysis 0 []  - Patient Transfer (multiple staff / Harrel Lemon Lift / Similar devices) 0 []  - Simple Staple / Suture removal (25 or less) 0 []  - Complex Staple / Suture removal (26 or more) 0 []  - Hypo / Hyperglycemic Management (close monitor of Blood Glucose) 0 []  - Ankle / Brachial Index (ABI) - do not check if billed separately 0 X - Vital Signs 1 5 Has the patient been seen at the hospital within the last three years: Yes Total Score: 85 Level Of Care: New/Established - Level 3 Electronic Signature(s) Signed: 07/09/2017 5:10:53 PM By: Montey Hora Entered By: Montey Hora on 07/09/2017 16:53:23 Clardy, [name] ([id number]) -------------------------------------------------------------------------------- Encounter Discharge Information Details Patient Name: [name] Date of Service: 07/09/2017 2:30 PM Medical Record Number: [id number] Patient Account Number: [id number] Date of Birth/Sex: July 10, 1926 (83 y.o. Female) Treating RN: Montey Hora Primary Care Trilby Way: Lucianne Lei Other Clinician: Referring Exa Bomba: Lucianne Lei Treating Eleshia Wooley/Extender: Melburn Hake, HOYT Weeks in Treatment: 6 Encounter Discharge Information Items Discharge Pain Level:  0 Discharge Condition: Stable Ambulatory Status: Cane Discharge Destination: Home Private Transportation: Auto Accompanied By: dtr Schedule Follow-up Appointment: Yes Medication Reconciliation completed and No provided to Patient/Care Elim Economou: Clinical Summary of Care: Electronic Signature(s) Signed: 07/09/2017 5:10:53 PM By: Montey Hora Entered By: Montey Hora on 07/09/2017 15:12:14 Nehring, [name] ([id number]) -------------------------------------------------------------------------------- Lower Extremity Assessment Details Patient Name: [name] Date of Service: 07/09/2017 2:30 PM Medical Record Number: [id number] Patient Account Number: [id number] Date of Birth/Sex: June 11, 1926 (83 y.o. Female) Treating RN: Montey Hora Primary Care Esther Bradstreet: Lucianne Lei Other Clinician: Referring Shealeigh Dunstan: Lucianne Lei Treating Deshannon Hinchliffe/Extender: Melburn Hake, HOYT Weeks in Treatment: 6 Vascular Assessment Pulses: Dorsalis Pedis Palpable: [Left:Yes] Posterior Tibial Extremity colors, hair growth, and conditions: Extremity Color: [Left:Hyperpigmented] Hair Growth on Extremity: [Left:No] Temperature of Extremity: [Left:Warm] Capillary Refill: [Left:< 3 seconds] Electronic Signature(s) Signed: 07/09/2017 5:10:53 PM By: Montey Hora Entered By: Montey Hora on 07/09/2017 14:58:11 Mcveigh, Georgeanne P. ([id number]) -------------------------------------------------------------------------------- Multi Wound Chart Details Patient Name: [name] Date of Service: 07/09/2017 2:30 PM Medical Record Number: [id number] Patient Account Number: [id number] Date of Birth/Sex: July 21, 1926 (83 y.o. Female) Treating RN: Montey Hora Primary Care Pallas Wahlert: Lucianne Lei Other Clinician: Referring Reese Senk: Lucianne Lei Treating Chidera Dearcos/Extender: Melburn Hake, HOYT Weeks in Treatment: 6 Vital Signs Height(in): 65 Pulse(bpm): 87 Weight(lbs): 131 Blood  Pressure 165/69 (mmHg): Body Mass Index(BMI): 22 Temperature(F): 98.2 Respiratory Rate 16 (breaths/min): Photos: [1:No Photos] [N/A:N/A] Wound Location: [1:Left Lower Leg - Anterior] [N/A:N/A] Wounding Event: [1:Trauma] [N/A:N/A] Primary Etiology: [1:Trauma, Other] [N/A:N/A] Comorbid History: [1:Anemia, Hypertension, Type II Diabetes, Osteoarthritis] [N/A:N/A] Date Acquired: [1:05/02/2017] [N/A:N/A] Weeks of Treatment: [1:6] [N/A:N/A] Wound Status: [1:Open] [N/A:N/A] Measurements L x W x D 6.5x1.1x0.1 [N/A:N/A] (cm) Area (cm) : [1:5.616] [N/A:N/A] Volume (cm) : [1:0.562] [N/A:N/A] % Reduction in Area: [1:-16.80%] [N/A:N/A] % Reduction in Volume: 41.50% [N/A:N/A] Classification: [1:Full  Thickness Without Exposed Support Structures] [N/A:N/A] HBO Classification: [1:Grade 1] [N/A:N/A] Exudate Amount: [1:Medium] [N/A:N/A] Exudate Type: [1:Serous] [N/A:N/A] Exudate Color: [1:amber] [N/A:N/A] Wound Margin: [1:Flat and Intact] [N/A:N/A] Granulation Amount: [1:None Present (0%)] [N/A:N/A] Necrotic Amount: [1:Large (67-100%)] [N/A:N/A] Exposed Structures: [1:Fat Layer (Subcutaneous Tissue) Exposed: Yes Fascia: No Tendon: No] [N/A:N/A] Muscle: No Joint: No Bone: No Epithelialization: None N/A N/A Periwound Skin Texture: Scarring: Yes N/A N/A Excoriation: No Induration: No Callus: No Crepitus: No Rash: No Periwound Skin Maceration: No N/A N/A Moisture: Dry/Scaly: No Periwound Skin Color: Erythema: Yes N/A N/A Atrophie Blanche: No Cyanosis: No Ecchymosis: No Hemosiderin Staining: No Mottled: No Pallor: No Rubor: No Erythema Location: Circumferential N/A N/A Temperature: No Abnormality N/A N/A Tenderness on Yes N/A N/A Palpation: Wound Preparation: Ulcer Cleansing: N/A N/A Rinsed/Irrigated with Saline Topical Anesthetic Applied: Other: lidocaine 4% Treatment Notes Electronic Signature(s) Signed: 07/09/2017 5:10:53 PM By: Montey Hora Entered By: Montey Hora on 07/09/2017 15:11:09 Marti, [name] ([id number]) -------------------------------------------------------------------------------- [place] Details Patient Name: [name] Date of Service: 07/09/2017 2:30 PM Medical Record Number: [id number] Patient Account Number: [id number] Date of Birth/Sex: Aug 14, 1926 (83 y.o. Female) Treating RN: Montey Hora Primary Care Ling Flesch: Lucianne Lei Other Clinician: Referring Fausto Sampedro: Lucianne Lei Treating Kazi Montoro/Extender: Melburn Hake, HOYT Weeks in Treatment: 6 Active Inactive ` Abuse / Safety / Falls / Self Care Management Nursing Diagnoses: Potential for falls Goals: Patient will remain injury free related to falls Date Initiated: 05/28/2017 Target Resolution Date: 07/23/2017 Goal Status: Active Interventions: Assess fall risk on admission and as needed Notes: ` Orientation to the Wound Care Program Nursing Diagnoses: Knowledge deficit related to the wound healing center program Goals: Patient/caregiver will verbalize understanding of the [place] Program Date Initiated: 05/28/2017 Target Resolution Date: 07/23/2017 Goal Status: Active Interventions: Provide education on orientation to the wound center Notes: ` Wound/Skin Impairment Nursing Diagnoses: Impaired tissue integrity Shaff, Falana P. ([id number]) Goals: Ulcer/skin breakdown will have a volume reduction of 30% by week 4 Date Initiated: 05/28/2017 Target Resolution Date: 07/23/2017 Goal Status: Active Ulcer/skin breakdown will have a volume reduction of 50% by week 8 Date Initiated: 05/28/2017 Target Resolution Date: 07/23/2017 Goal Status: Active Ulcer/skin breakdown will have a volume reduction of 80% by week 12 Date Initiated: 05/28/2017 Target Resolution Date: 07/23/2017 Goal Status: Active Ulcer/skin breakdown will heal within 14 weeks Date Initiated: 05/28/2017 Target Resolution Date: 07/23/2017 Goal Status:  Active Interventions: Assess patient/caregiver ability to obtain necessary supplies Assess patient/caregiver ability to perform ulcer/skin care regimen upon admission and as needed Assess ulceration(s) every visit Notes: Electronic Signature(s) Signed: 07/09/2017 5:10:53 PM By: Montey Hora Entered By: Montey Hora on 07/09/2017 15:11:02 Marconi, ALPFXTKW P. ([id number]) -------------------------------------------------------------------------------- Pain Assessment Details Patient Name: [name] Date of Service: 07/09/2017 2:30 PM Medical Record Number: [id number] Patient Account Number: [id number] Date of Birth/Sex: [birth year] (83 y.o. Female) Treating RN: Montey Hora Primary Care Winston Misner: Lucianne Lei Other Clinician: Referring Deliah Strehlow: Lucianne Lei Treating Edyn Qazi/Extender: Melburn Hake, HOYT Weeks in Treatment: 6 Active Problems Location of Pain Severity and Description of Pain Patient Has Paino No Site Locations Pain Management and Medication Current Pain Management: Notes Topical or injectable lidocaine is offered to patient for acute pain when surgical debridement is performed. If needed, Patient is instructed to use over the counter pain medication for the following 24-48 hours after debridement. Wound care MDs do not prescribed pain medications. Patient has chronic pain or uncontrolled pain. Patient has been instructed to make an appointment with their Primary Care Physician  for pain management. Electronic Signature(s) Signed: 07/09/2017 5:10:53 PM By: Montey Hora Entered By: Montey Hora on 07/09/2017 14:51:07 Bells, [name] ([id number]) -------------------------------------------------------------------------------- Patient/Caregiver Education Details Patient Name: [name] Date of Service: 07/09/2017 2:30 PM Medical Record Number: [id number] Patient Account Number: [id number] Date of Birth/Gender: Jan 24, 1926 (83 y.o.  Female) Treating RN: Montey Hora Primary Care Physician: Lucianne Lei Other Clinician: Referring Physician: Lucianne Lei Treating Physician/Extender: Sharalyn Ink in Treatment: 6 Education Assessment Education Provided To: Patient and Caregiver Education Topics Provided Wound/Skin Impairment: Handouts: Other: wound care as ordered Methods: Demonstration, Explain/Verbal Responses: State content correctly Electronic Signature(s) Signed: 07/09/2017 5:10:53 PM By: Montey Hora Entered By: Montey Hora on 07/09/2017 15:12:30 Branca, FOYDXAJO P. ([id number]) -------------------------------------------------------------------------------- Wound Assessment Details Patient Name: [name] Date of Service: 07/09/2017 2:30 PM Medical Record Number: [id number] Patient Account Number: [id number] Date of Birth/Sex: [birth year] (83 y.o. Female) Treating RN: Montey Hora Primary Care Jaimere Feutz: Lucianne Lei Other Clinician: Referring Shaylon Gillean: Lucianne Lei Treating Loney Peto/Extender: Melburn Hake, HOYT Weeks in Treatment: 6 Wound Status Wound Number: 1 Primary Trauma, Other Etiology: Wound Location: Left Lower Leg - Anterior Wound Open Wounding Event: Trauma Status: Date Acquired: 05/02/2017 Comorbid Anemia, Hypertension, Type II Weeks Of Treatment: 6 History: Diabetes, Osteoarthritis Clustered Wound: No Wound Measurements Length: (cm) 6.5 Width: (cm) 1.1 Depth: (cm) 0.1 Area: (cm) 5.616 Volume: (cm) 0.562 % Reduction in Area: -16.8% % Reduction in Volume: 41.5% Epithelialization: None Tunneling: No Undermining: No Wound Description Full Thickness Without Classification: Exposed Support Structures Diabetic Severity Grade 1 (Wagner): Wound Margin: Flat and Intact Exudate Amount: Medium Exudate Type: Serous Exudate Color: amber Foul Odor After Cleansing: No Slough/Fibrino Yes Wound Bed Granulation Amount: None Present (0%) Exposed  Structure Necrotic Amount: Large (67-100%) Fascia Exposed: No Necrotic Quality: Adherent Slough Fat Layer (Subcutaneous Tissue) Exposed: Yes Tendon Exposed: No Muscle Exposed: No Joint Exposed: No Bone Exposed: No Periwound Skin Texture Texture Color No Abnormalities Noted: No No Abnormalities Noted: No Callus: No Atrophie Blanche: No Crepitus: No Cyanosis: No Daniele, Ilean P. ([id number]) Excoriation: No Ecchymosis: No Induration: No Erythema: Yes Rash: No Erythema Location: Circumferential Scarring: Yes Hemosiderin Staining: No Mottled: No Moisture Pallor: No No Abnormalities Noted: No Rubor: No Dry / Scaly: No Maceration: No Temperature / Pain Temperature: No Abnormality Tenderness on Palpation: Yes Wound Preparation Ulcer Cleansing: Rinsed/Irrigated with Saline Topical Anesthetic Applied: Other: lidocaine 4%, Treatment Notes Wound #1 (Left, Anterior Lower Leg) 1. Cleansed with: Clean wound with Normal Saline 2. Anesthetic Topical Lidocaine 4% cream to wound bed prior to debridement 4. Dressing Applied: Santyl Ointment 5. Secondary Dressing Applied Bordered Foam Dressing Dry Gauze Electronic Signature(s) Signed: 07/09/2017 5:10:53 PM By: Montey Hora Entered By: Montey Hora on 07/09/2017 14:57:51 Robicheaux, [name] ([id number]) -------------------------------------------------------------------------------- Vitals Details Patient Name: [name] Date of Service: 07/09/2017 2:30 PM Medical Record Number: [id number] Patient Account Number: [id number] Date of Birth/Sex: 1926/11/02 (83 y.o. Female) Treating RN: Montey Hora Primary Care Meria Crilly: Lucianne Lei Other Clinician: Referring Valena Ivanov: Lucianne Lei Treating Tymier Lindholm/Extender: Melburn Hake, HOYT Weeks in Treatment: 6 Vital Signs Time Taken: 14:52 Temperature (F): 98.2 Height (in): 65 Pulse (bpm): 87 Weight (lbs): 131 Respiratory Rate (breaths/min): 16 Body Mass Index  (BMI): 21.8 Blood Pressure (mmHg): 165/69 Reference Range: 80 - 120 mg / dl Electronic Signature(s) Signed: 07/09/2017 5:10:53 PM By: Montey Hora Entered By: Montey Hora on 07/09/2017 14:53:35

## 2017-07-16 ENCOUNTER — Encounter: Payer: Medicare Other | Admitting: Physician Assistant

## 2017-07-16 DIAGNOSIS — E11622 Type 2 diabetes mellitus with other skin ulcer: Secondary | ICD-10-CM | POA: Diagnosis not present

## 2017-07-18 NOTE — Progress Notes (Signed)
Alexa, [name] ([id number]) Visit Report for 07/16/2017 Arrival Information Details Patient Name: [name], [name] Date of Service: 07/16/2017 10:30 AM Medical Record Number: [id number] Patient Account Number: [id number] Date of Birth/Sex: 12/23/1925 (83 y.o. Female) Treating RN: Carolyne Fiscal, Debi Primary Care Jadah Bobak: Lucianne Lei Other Clinician: Referring Addeline Calarco: Lucianne Lei Treating Fumie Fiallo/Extender: Melburn Hake, HOYT Weeks in Treatment: 7 Visit Information History Since Last Visit All ordered tests and consults were completed: No Patient Arrived: Cane Added or deleted any medications: No Arrival Time: 10:57 Any new allergies or adverse reactions: No Accompanied By: granddaughter Had a fall or experienced change in No Transfer Assistance: None activities of daily living that may affect Patient Identification Verified: Yes risk of falls: Secondary Verification Process Yes Signs or symptoms of abuse/neglect since last No Completed: visito Patient Requires Transmission- No Hospitalized since last visit: No Based Precautions: Has Dressing in Place as Prescribed: Yes Patient Has Alerts: Yes Pain Present Now: No Electronic Signature(s) Signed: 07/16/2017 4:38:16 PM By: Alric Quan Entered By: Alric Quan on 07/16/2017 10:57:39 Mannis, GXQJJHER P. ([id number]) -------------------------------------------------------------------------------- Clinic Level of Care Assessment Details Patient Name: Alexa Lowes P. Date of Service: 07/16/2017 10:30 AM Medical Record Number: [id number] Patient Account Number: [id number] Date of Birth/Sex: 1926-09-18 (83 y.o. Female) Treating RN: Carolyne Fiscal, Debi Primary Care Willet Schleifer: Lucianne Lei Other Clinician: Referring Joshva Labreck: Lucianne Lei Treating Maccoy Haubner/Extender: Melburn Hake, HOYT Weeks in Treatment: 7 Clinic Level of Care Assessment Items TOOL 4 Quantity Score X - Use when only an EandM is performed on FOLLOW-UP visit  1 0 ASSESSMENTS - Nursing Assessment / Reassessment X - Reassessment of Co-morbidities (includes updates in patient status) 1 10 X - Reassessment of Adherence to Treatment Plan 1 5 ASSESSMENTS - Wound and Skin Assessment / Reassessment X - Simple Wound Assessment / Reassessment - one wound 1 5 []  - Complex Wound Assessment / Reassessment - multiple wounds 0 []  - Dermatologic / Skin Assessment (not related to wound area) 0 ASSESSMENTS - Focused Assessment []  - Circumferential Edema Measurements - multi extremities 0 []  - Nutritional Assessment / Counseling / Intervention 0 []  - Lower Extremity Assessment (monofilament, tuning fork, pulses) 0 []  - Peripheral Arterial Disease Assessment (using hand held doppler) 0 ASSESSMENTS - Ostomy and/or Continence Assessment and Care []  - Incontinence Assessment and Management 0 []  - Ostomy Care Assessment and Management (repouching, etc.) 0 PROCESS - Coordination of Care X - Simple Patient / Family Education for ongoing care 1 15 []  - Complex (extensive) Patient / Family Education for ongoing care 0 []  - Staff obtains Programmer, systems, Records, Test Results / Process Orders 0 []  - Staff telephones HHA, Nursing Homes / Clarify orders / etc 0 []  - Routine Transfer to another Facility (non-emergent condition) 0 [name], [name] ([id number]) []  - Routine Hospital Admission (non-emergent condition) 0 []  - New Admissions / Biomedical engineer / Ordering NPWT, Apligraf, etc. 0 []  - Emergency Hospital Admission (emergent condition) 0 X - Simple Discharge Coordination 1 10 []  - Complex (extensive) Discharge Coordination 0 PROCESS - Special Needs []  - Pediatric / Minor Patient Management 0 []  - Isolation Patient Management 0 []  - Hearing / Language / Visual special needs 0 []  - Assessment of Community assistance (transportation, D/C planning, etc.) 0 []  - Additional assistance / Altered mentation 0 []  - Support Surface(s) Assessment (bed, cushion, seat,  etc.) 0 INTERVENTIONS - Wound Cleansing / Measurement X - Simple Wound Cleansing - one wound 1 5 []  - Complex Wound Cleansing - multiple wounds 0 X - Wound  Imaging (photographs - any number of wounds) 1 5 []  - Wound Tracing (instead of photographs) 0 X - Simple Wound Measurement - one wound 1 5 []  - Complex Wound Measurement - multiple wounds 0 INTERVENTIONS - Wound Dressings X - Small Wound Dressing one or multiple wounds 1 10 []  - Medium Wound Dressing one or multiple wounds 0 []  - Large Wound Dressing one or multiple wounds 0 X - Application of Medications - topical 1 5 []  - Application of Medications - injection 0 INTERVENTIONS - Miscellaneous []  - External ear exam 0 [name], [name] ([id number]) []  - Specimen Collection (cultures, biopsies, blood, body fluids, etc.) 0 []  - Specimen(s) / Culture(s) sent or taken to Lab for analysis 0 []  - Patient Transfer (multiple staff / Harrel Lemon Lift / Similar devices) 0 []  - Simple Staple / Suture removal (25 or less) 0 []  - Complex Staple / Suture removal (26 or more) 0 []  - Hypo / Hyperglycemic Management (close monitor of Blood Glucose) 0 []  - Ankle / Brachial Index (ABI) - do not check if billed separately 0 X - Vital Signs 1 5 Has the patient been seen at the hospital within the last three years: Yes Total Score: 80 Level Of Care: New/Established - Level 3 Electronic Signature(s) Signed: 07/16/2017 4:38:16 PM By: Alric Quan Entered By: Alric Quan on 07/16/2017 12:57:41 [name], [name] ([id number]) -------------------------------------------------------------------------------- Encounter Discharge Information Details Patient Name: Alexa Lowes P. Date of Service: 07/16/2017 10:30 AM Medical Record Number: [id number] Patient Account Number: [id number] Date of Birth/Sex: 04/28/1926 (83 y.o. Female) Treating RN: Carolyne Fiscal, Debi Primary Care Nimisha Rathel: Lucianne Lei Other Clinician: Referring Destiny Trickey: Lucianne Lei Treating Payzlee Ryder/Extender: Melburn Hake, HOYT Weeks in Treatment: 7 Encounter Discharge Information Items Discharge Pain Level: 0 Discharge Condition: Stable Ambulatory Status: Cane Discharge Destination: Home Transportation: Private Auto Accompanied By: granddaughter Schedule Follow-up Appointment: Yes Medication Reconciliation completed and provided to No Patient/Care Niyam Bisping: Provided on Clinical Summary of Care: 07/16/2017 Form Type Recipient Paper Patient GW Electronic Signature(s) Signed: 07/16/2017 4:38:16 PM By: Alric Quan Entered By: Alric Quan on 07/16/2017 11:51:18 Traore, [name] ([id number]) -------------------------------------------------------------------------------- Lower Extremity Assessment Details Patient Name: Alexa Lowes P. Date of Service: 07/16/2017 10:30 AM Medical Record Number: [id number] Patient Account Number: [id number] Date of Birth/Sex: Jan 27, 1926 (83 y.o. Female) Treating RN: Carolyne Fiscal, Debi Primary Care Hayleigh Bawa: Lucianne Lei Other Clinician: Referring Tiaira Arambula: Lucianne Lei Treating Kerria Sapien/Extender: Melburn Hake, HOYT Weeks in Treatment: 7 Vascular Assessment Pulses: Dorsalis Pedis Palpable: [Left:Yes] Posterior Tibial Extremity colors, hair growth, and conditions: Extremity Color: [Left:Hyperpigmented] Temperature of Extremity: [Left:Warm] Capillary Refill: [Left:< 3 seconds] Toe Nail Assessment Left: Right: Thick: Yes Discolored: No Deformed: No Improper Length and Hygiene: No Electronic Signature(s) Signed: 07/16/2017 4:38:16 PM By: Alric Quan Entered By: Alric Quan on 07/16/2017 11:08:49 Wooldridge, Shereese P. ([id number]) -------------------------------------------------------------------------------- Multi Wound Chart Details Patient Name: Alexa Lowes P. Date of Service: 07/16/2017 10:30 AM Medical Record Number: [id number] Patient Account Number: [id number] Date of Birth/Sex: Apr 26, 1926  (83 y.o. Female) Treating RN: Carolyne Fiscal, Debi Primary Care Vessie Olmsted: Lucianne Lei Other Clinician: Referring Jeani Fassnacht: Lucianne Lei Treating Jeyli Zwicker/Extender: STONE III, HOYT Weeks in Treatment: 7 Vital Signs Height(in): 65 Pulse(bpm): 93 Weight(lbs): 131 Blood Pressure 138/63 (mmHg): Body Mass Index(BMI): 22 Temperature(F): 98.2 Respiratory Rate 16 (breaths/min): Photos: [1:No Photos] [N/A:N/A] Wound Location: [1:Left Lower Leg - Anterior] [N/A:N/A] Wounding Event: [1:Trauma] [N/A:N/A] Primary Etiology: [1:Trauma, Other] [N/A:N/A] Comorbid History: [1:Anemia, Hypertension, Type II Diabetes, Osteoarthritis] [N/A:N/A] Date Acquired: [1:05/02/2017] [N/A:N/A] Weeks of Treatment: [1:7] [N/A:N/A] Wound Status: [1:Open] [  N/A:N/A] Measurements L x W x D 6.5x1.2x0.1 [N/A:N/A] (cm) Area (cm) : [1:6.126] [N/A:N/A] Volume (cm) : [1:0.613] [N/A:N/A] % Reduction in Area: [1:-27.40%] [N/A:N/A] % Reduction in Volume: 36.20% [N/A:N/A] Classification: [1:Full Thickness Without Exposed Support Structures] [N/A:N/A] HBO Classification: [1:Grade 1] [N/A:N/A] Exudate Amount: [1:Medium] [N/A:N/A] Exudate Type: [1:Serous] [N/A:N/A] Exudate Color: [1:amber] [N/A:N/A] Wound Margin: [1:Flat and Intact] [N/A:N/A] Granulation Amount: [1:None Present (0%)] [N/A:N/A] Necrotic Amount: [1:Large (67-100%)] [N/A:N/A] Exposed Structures: [1:Fat Layer (Subcutaneous Tissue) Exposed: Yes Fascia: No Tendon: No] [N/A:N/A] Muscle: No Joint: No Bone: No Epithelialization: None N/A N/A Periwound Skin Texture: Scarring: Yes N/A N/A Excoriation: No Induration: No Callus: No Crepitus: No Rash: No Periwound Skin Maceration: No N/A N/A Moisture: Dry/Scaly: No Periwound Skin Color: Erythema: Yes N/A N/A Atrophie Blanche: No Cyanosis: No Ecchymosis: No Hemosiderin Staining: No Mottled: No Pallor: No Rubor: No Erythema Location: Circumferential N/A N/A Temperature: No Abnormality N/A  N/A Tenderness on Yes N/A N/A Palpation: Wound Preparation: Ulcer Cleansing: N/A N/A Rinsed/Irrigated with Saline Topical Anesthetic Applied: Other: lidocaine 4% Treatment Notes Electronic Signature(s) Signed: 07/16/2017 4:38:16 PM By: Alric Quan Entered By: Alric Quan on 07/16/2017 11:36:43 Sundby, [name] ([id number]) -------------------------------------------------------------------------------- [place] Details Patient Name: Alexa Lowes P. Date of Service: 07/16/2017 10:30 AM Medical Record Number: [id number] Patient Account Number: [id number] Date of Birth/Sex: 04-Feb-1926 (83 y.o. Female) Treating RN: Carolyne Fiscal, Debi Primary Care Dedra Matsuo: Lucianne Lei Other Clinician: Referring Deloss Amico: Lucianne Lei Treating Bonne Whack/Extender: Melburn Hake, HOYT Weeks in Treatment: 7 Active Inactive ` Abuse / Safety / Falls / Self Care Management Nursing Diagnoses: Potential for falls Goals: Patient will remain injury free related to falls Date Initiated: 05/28/2017 Target Resolution Date: 07/23/2017 Goal Status: Active Interventions: Assess fall risk on admission and as needed Notes: ` Orientation to the Wound Care Program Nursing Diagnoses: Knowledge deficit related to the wound healing center program Goals: Patient/caregiver will verbalize understanding of the Colon Program Date Initiated: 05/28/2017 Target Resolution Date: 07/23/2017 Goal Status: Active Interventions: Provide education on orientation to the wound center Notes: ` Wound/Skin Impairment Nursing Diagnoses: Impaired tissue integrity Alper, Verla P. ([id number]) Goals: Ulcer/skin breakdown will have a volume reduction of 30% by week 4 Date Initiated: 05/28/2017 Target Resolution Date: 07/23/2017 Goal Status: Active Ulcer/skin breakdown will have a volume reduction of 50% by week 8 Date Initiated: 05/28/2017 Target Resolution Date: 07/23/2017 Goal  Status: Active Ulcer/skin breakdown will have a volume reduction of 80% by week 12 Date Initiated: 05/28/2017 Target Resolution Date: 07/23/2017 Goal Status: Active Ulcer/skin breakdown will heal within 14 weeks Date Initiated: 05/28/2017 Target Resolution Date: 07/23/2017 Goal Status: Active Interventions: Assess patient/caregiver ability to obtain necessary supplies Assess patient/caregiver ability to perform ulcer/skin care regimen upon admission and as needed Assess ulceration(s) every visit Notes: Electronic Signature(s) Signed: 07/16/2017 4:38:16 PM By: Alric Quan Entered By: Alric Quan on 07/16/2017 11:36:32 Qadir, XBDZHGDJ P. ([id number]) -------------------------------------------------------------------------------- Pain Assessment Details Patient Name: Alexa Lowes P. Date of Service: 07/16/2017 10:30 AM Medical Record Number: [id number] Patient Account Number: [id number] Date of Birth/Sex: February 22, 1926 (83 y.o. Female) Treating RN: Carolyne Fiscal, Debi Primary Care Swayzee Wadley: Lucianne Lei Other Clinician: Referring Charae Depaolis: Lucianne Lei Treating Madelynn Malson/Extender: Melburn Hake, HOYT Weeks in Treatment: 7 Active Problems Location of Pain Severity and Description of Pain Patient Has Paino No Site Locations With Dressing Change: No Pain Management and Medication Current Pain Management: Electronic Signature(s) Signed: 07/16/2017 4:38:16 PM By: Alric Quan Entered By: Alric Quan on 07/16/2017 10:57:46 Plemmons, [name] ([id number]) -------------------------------------------------------------------------------- Patient/Caregiver Education Details Patient Name:  Brenton, Latecia P. Date of Service: 07/16/2017 10:30 AM Medical Record Number: [id number] Patient Account Number: [id number] Date of Birth/Gender: 14-Aug-1926 (83 y.o. Female) Treating RN: Carolyne Fiscal, Debi Primary Care Physician: Lucianne Lei Other Clinician: Referring Physician: Lucianne Lei Treating Physician/Extender: Sharalyn Ink in Treatment: 7 Education Assessment Education Provided To: Patient Education Topics Provided Wound/Skin Impairment: Handouts: Other: change dressing as ordered Methods: Demonstration, Explain/Verbal Responses: State content correctly Electronic Signature(s) Signed: 07/16/2017 4:38:16 PM By: Alric Quan Entered By: Alric Quan on 07/16/2017 11:51:27 Eagen, NUUVOZDG P. ([id number]) -------------------------------------------------------------------------------- Wound Assessment Details Patient Name: Alexa Lowes P. Date of Service: 07/16/2017 10:30 AM Medical Record Number: [id number] Patient Account Number: [id number] Date of Birth/Sex: 1926-11-05 (83 y.o. Female) Treating RN: Carolyne Fiscal, Debi Primary Care Maceo Hernan: Lucianne Lei Other Clinician: Referring Zaydenn Balaguer: Lucianne Lei Treating Dyesha Henault/Extender: Melburn Hake, HOYT Weeks in Treatment: 7 Wound Status Wound Number: 1 Primary Trauma, Other Etiology: Wound Location: Left Lower Leg - Anterior Wound Open Wounding Event: Trauma Status: Date Acquired: 05/02/2017 Comorbid Anemia, Hypertension, Type II Weeks Of Treatment: 7 History: Diabetes, Osteoarthritis Clustered Wound: No Photos Photo Uploaded By: Alric Quan on 07/16/2017 11:54:45 Wound Measurements Length: (cm) 6.5 % Reduction in Width: (cm) 1.2 % Reduction in Depth: (cm) 0.1 Epithelializat Area: (cm) 6.126 Tunneling: Volume: (cm) 0.613 Undermining: Area: -27.4% Volume: 36.2% ion: None No No Wound Description Full Thickness Without Foul Odor Afte Classification: Exposed Support Structures Slough/Fibrino Diabetic Severity Grade 1 (Wagner): Wound Margin: Flat and Intact Exudate Amount: Medium Exudate Type: Serous Exudate Color: amber r Cleansing: No Yes Wound Bed Granulation Amount: None Present (0%) Exposed Structure Necrotic Amount: Large (67-100%) Fascia Exposed:  No Spittler, Erynne P. ([id number]) Necrotic Quality: Adherent Slough Fat Layer (Subcutaneous Tissue) Exposed: Yes Tendon Exposed: No Muscle Exposed: No Joint Exposed: No Bone Exposed: No Periwound Skin Texture Texture Color No Abnormalities Noted: No No Abnormalities Noted: No Callus: No Atrophie Blanche: No Crepitus: No Cyanosis: No Excoriation: No Ecchymosis: No Induration: No Erythema: Yes Rash: No Erythema Location: Circumferential Scarring: Yes Hemosiderin Staining: No Mottled: No Moisture Pallor: No No Abnormalities Noted: No Rubor: No Dry / Scaly: No Maceration: No Temperature / Pain Temperature: No Abnormality Tenderness on Palpation: Yes Wound Preparation Ulcer Cleansing: Rinsed/Irrigated with Saline Topical Anesthetic Applied: Other: lidocaine 4%, Treatment Notes Wound #1 (Left, Anterior Lower Leg) 1. Cleansed with: Clean wound with Normal Saline 2. Anesthetic Topical Lidocaine 4% cream to wound bed prior to debridement 3. Peri-wound Care: Skin Prep 4. Dressing Applied: Santyl Ointment 5. Secondary Dressing Applied Bordered Foam Dressing Dry Gauze Notes netting Electronic Signature(s) Signed: 07/16/2017 4:38:16 PM By: Alric Quan Entered By: Alric Quan on 07/16/2017 11:04:32 Stein, [name] ([id number]) Poupard, [name] ([id number]) -------------------------------------------------------------------------------- Vitals Details Patient Name: Alexa Lowes P. Date of Service: 07/16/2017 10:30 AM Medical Record Number: [id number] Patient Account Number: [id number] Date of Birth/Sex: 1926/02/25 (83 y.o. Female) Treating RN: Carolyne Fiscal, Debi Primary Care Tenia Goh: Lucianne Lei Other Clinician: Referring Javeon Macmurray: Lucianne Lei Treating Jaisha Villacres/Extender: Melburn Hake, HOYT Weeks in Treatment: 7 Vital Signs Time Taken: 10:57 Temperature (F): 98.2 Height (in): 65 Pulse (bpm): 93 Weight (lbs): 131 Respiratory Rate  (breaths/min): 16 Body Mass Index (BMI): 21.8 Blood Pressure (mmHg): 138/63 Reference Range: 80 - 120 mg / dl Electronic Signature(s) Signed: 07/16/2017 4:38:16 PM By: Alric Quan Entered By: Alric Quan on 07/16/2017 10:59:28

## 2017-07-18 NOTE — Progress Notes (Signed)
[name], [name] ([id number]) Visit Report for 07/16/2017 Chief Complaint Document Details Patient Name: [name], [name]. Date of Service: 07/16/2017 10:30 AM Medical Record Number: [id number] Patient Account Number: [id number] Date of Birth/Sex: 1926/05/17 (83 y.o. Female) Treating RN: Carolyne Fiscal, Debi Primary Care Provider: Lucianne Lei Other Clinician: Referring Provider: Lucianne Lei Treating Provider/Extender: Melburn Hake, HOYT Weeks in Treatment: 7 Information Obtained from: Patient Chief Complaint Patients presents for treatment of an open diabetic ulcer 2 the left lower anterior leg which she sustained an injury by blunt trauma Electronic Signature(s) Signed: 07/16/2017 3:50:36 PM By: Worthy Keeler PA-C Entered By: Worthy Keeler on 07/16/2017 11:31:09 Carelock, Alder. ([id number]) -------------------------------------------------------------------------------- HPI Details Patient Name: Alexa Lowes [name]. Date of Service: 07/16/2017 10:30 AM Medical Record Number: [id number] Patient Account Number: [id number] Date of Birth/Sex: 21-Aug-1926 (83 y.o. Female) Treating RN: Carolyne Fiscal, Debi Primary Care Provider: Lucianne Lei Other Clinician: Referring Provider: Lucianne Lei Treating Provider/Extender: Melburn Hake, HOYT Weeks in Treatment: 7 History of Present Illness Location: left anterior shin Quality: Patient reports experiencing a dull pain to affected area(s). Severity: Patient states wound are getting worse. Duration: Patient has had the wound for < 3 weeks prior to presenting for treatment Timing: Pain in wound is Intermittent (comes and goes Context: The wound occurred when the patient blunt trauma against her left shin Modifying Factors: Other treatment(s) tried include:active Bactroban ointment locally Associated Signs and Symptoms: Patient reports having: occasional discharge from the wound HPI Description: 83 year old patient was seen in the ED about 10 days  ago for a history of abrasion to the left lower extremity while she was boarding a bus and scraped the anterior part of her left leg. She has been a diabetic for many years and has been taking treatment regularly. Her past medical history is also significant for anemia arthritis constipation and hypertension and is status post abdominal hysterectomy. She has never been a smoker. after her ER visit she was advised to apply Bactroban ointment to the wound twice a day and continue to monitor her blood glucose levels. her last hemoglobin A1c was done 3 years ago and was 6.6% 06/04/17 patient left anterior shin wound appears to be doing well although it is still somewhat dry despite the treatment with Medihoney. We have been using Kerlex following the Medihoney application and I believe this is just not retaining that much moisture. Nonetheless there is no evidence of infection. 06/11/17 on evaluation today patient appears to be doing a little bit worse in regard to her wound. The entirety of the wound is dry and unfortunately the Medihoney does not seem to be helping this. That is even with the dressing changes that I made last week to try to retain more moisture. She has not tried [employer] as of yet. She was also noncompressible when testing for ABIs. 07/01/2017 -- she had a arterial studies done and was seen by Dr. Lucky Cowboy. her noninvasive studies showed noncompressible vessels on the right but brisk waveforms and digital pressures of 71 on the right consistent with only mild arterial insufficienc. On the left her ABI was 0.47 and this may be falsely elevated due to calcification. No digital pressures were obtained on the left and this is consistent with severe arterial insufficiency. this critical limb threatening situation on the left, would make wound healing difficult and it represents a serious situation and he has recommended an angiography with possible revascularization. The patient desired to  defer the decision to she discuss with the family members. 07/09/17  on evaluation today patient appears to be doing fairly well in regard to her left anterior lower extremity wound. She has been tolerating the dressing changes without complication we have been utilizing Santyl at this time. She tells me that she is having no significant pain compared to what she has had in the past. She has decided after discussing with family that she is going to go forward with the surgery with Dr. dew. This is to restore better blood flow to left lower extremity. With that it does appear that her lower extremity wound is making some progress albeit slow. No fevers, chills, nausea, or vomiting noted at this [facility], Erricka [name]. ([id number]) time. 07/16/17 on evaluation today patient's wound appears to be doing roughly about the same although some of the slough is clearing off. [name] she has her appointment with vascular next week on Thursday the 23rd. With that being said her wound does not appear to be hurting as badly as it has in the past which is good news. No fevers, chills, nausea, or vomiting noted at this time. Electronic Signature(s) Signed: 07/16/2017 3:50:36 PM By: Worthy Keeler PA-C Entered By: Worthy Keeler on 07/16/2017 12:50:32 Degroff, [name] ([id number]) -------------------------------------------------------------------------------- Physical Exam Details Patient Name: Alexa Lowes [name]. Date of Service: 07/16/2017 10:30 AM Medical Record Number: [id number] Patient Account Number: [id number] Date of Birth/Sex: 06-06-1926 (83 y.o. Female) Treating RN: Carolyne Fiscal, Debi Primary Care Provider: Lucianne Lei Other Clinician: Referring Provider: Lucianne Lei Treating Provider/Extender: STONE III, HOYT Weeks in Treatment: 7 Constitutional Well-nourished and well-hydrated in no acute distress. Respiratory normal breathing without difficulty. clear to auscultation  bilaterally. Cardiovascular regular rate and rhythm with normal S1, S2. Psychiatric this patient is able to make decisions and demonstrates good insight into disease process. Alert and Oriented x 3. pleasant and cooperative. Notes Patient's wound is slough covered although she does have a granular tissue that is starting to show underneath. Fortunately she no longer has the thick eschar that she originally had. Electronic Signature(s) Signed: 07/16/2017 3:50:36 PM By: Worthy Keeler PA-C Entered By: Worthy Keeler on 07/16/2017 12:51:18 Tinner, [name] ([id number]) -------------------------------------------------------------------------------- Physician Orders Details Patient Name: Alexa Lowes [name]. Date of Service: 07/16/2017 10:30 AM Medical Record Number: [id number] Patient Account Number: [id number] Date of Birth/Sex: Oct 31, 1926 (83 y.o. 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 o May Shower, gently pat wound dry prior to applying new dressing. Anesthetic Wound #1 Left,Anterior Lower Leg o Topical Lidocaine 4% cream applied to wound bed prior to debridement Skin Barriers/Peri-Wound Care Wound #1 Left,Anterior Lower Leg o Skin Prep Primary Wound Dressing Wound #1 Left,Anterior Lower Leg o Santyl Ointment Secondary Dressing Wound #1 Left,Anterior Lower Leg o Dry Gauze o Boardered Foam  Dressing o Other - stretch netting #4 Dressing Change Frequency Wound #1 Left,Anterior Lower Leg o Change dressing every day. Follow-up Appointments LIRIO, BACH ([id number]) Wound #1 Left,Anterior Lower Leg o Return Appointment in 1 week. Edema Control Wound #1 Left,Anterior Lower Leg o Elevate legs to the level  of the heart and pump ankles as often as possible Additional Orders / Instructions Wound #1 Left,Anterior Lower Leg o Increase protein intake. o Other: - Please add vitamin A, vitamin C and zinc supplements to your diet Electronic Signature(s) Signed: 07/16/2017 3:50:36 PM By: Worthy Keeler PA-C Signed: 07/16/2017 4:38:16 PM By: Alric Quan Entered By: Alric Quan on 07/16/2017 11:50:48 [name], [name]. ([id number]) -------------------------------------------------------------------------------- Problem List Details Patient Name: Alexa Lowes [name]. Date of Service: 07/16/2017 10:30 AM Medical Record Number: [id number] Patient Account Number: [id number] Date of Birth/Sex: [birth year] (83 y.o. 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 ([id number]) -------------------------------------------------------------------------------- Progress Note Details Patient Name: Alexa Lowes [name]. Date of  Service: 07/16/2017 10:30 AM Medical Record Number: [id number] Patient Account Number: [id number] Date of Birth/Sex: 1925-12-26 (83 y.o. Female) Treating RN: Carolyne Fiscal, Debi Primary Care Provider: Lucianne Lei Other Clinician: Referring Provider: Lucianne Lei Treating Provider/Extender: Melburn Hake, HOYT Weeks in Treatment: 7 Subjective Chief Complaint Information obtained from Patient Patients presents for treatment of an open diabetic ulcer 2 the left lower anterior leg which she sustained an injury by blunt trauma History of Present Illness (HPI) The following HPI elements were documented for the patient's wound: Location: left anterior shin Quality: Patient reports experiencing a dull pain to affected area(s). Severity: Patient states wound are getting worse. Duration: Patient has had the wound for < 3 weeks prior to presenting for treatment Timing: Pain in wound is Intermittent (comes and goes Context: The wound occurred when the patient blunt trauma against her left shin Modifying Factors: Other treatment(s) tried include:active Bactroban ointment locally Associated Signs and Symptoms: Patient reports having: occasional discharge from the wound 83 year old patient was seen in the ED about 10 days ago for a history of abrasion to the left lower extremity while she was boarding a bus and scraped the anterior part of her left leg. She has been a diabetic for many years and has been taking treatment regularly. Her past medical history is also significant for anemia arthritis constipation and hypertension and is status post abdominal hysterectomy. She has never been a smoker. after her ER visit she was advised to apply Bactroban ointment to the wound twice a day and continue to monitor her blood glucose levels. her last hemoglobin A1c was done 3 years ago and was 6.6% 06/04/17 patient left anterior shin wound appears to be doing well although it is still somewhat dry despite the treatment  with Medihoney. We have been using Kerlex following the Medihoney application and I believe this is just not retaining that much moisture. Nonetheless there is no evidence of infection. 06/11/17 on evaluation today patient appears to be doing a little bit worse in regard to her wound. The entirety of the wound is dry and unfortunately the Medihoney does not seem to be helping this. That is even with the dressing changes that I made last week to try to retain more moisture. She has not tried [employer] as of yet. She was  also noncompressible when testing for ABIs. 07/01/2017 -- she had a arterial studies done and was seen by Dr. Lucky Cowboy. her noninvasive studies showed noncompressible vessels on the right but brisk waveforms and digital pressures of 71 on the right consistent with only mild arterial insufficienc. On the left her ABI was 0.47 and this may be falsely elevated due to calcification. No digital pressures were obtained on the left and this is consistent with severe arterial insufficiency. this critical limb threatening situation on the left, would make wound healing difficult and it [name], [name]. ([id number]) represents a serious situation and he has recommended an angiography with possible revascularization. The patient desired to defer the decision to she discuss with the family members. 07/09/17 on evaluation today patient appears to be doing fairly well in regard to her left anterior lower extremity wound. She has been tolerating the dressing changes without complication we have been utilizing Santyl at this time. She tells me that she is having no significant pain compared to what she has had in the past. She has decided after discussing with family that she is going to go forward with the surgery with Dr. dew. This is to restore better blood flow to left lower extremity. With that it does appear that her lower extremity wound is making some progress albeit slow. No fevers, chills,  nausea, or vomiting noted at this time. 07/16/17 on evaluation today patient's wound appears to be doing roughly about the same although some of the slough is clearing off. [name] she has her appointment with vascular next week on Thursday the 23rd. With that being said her wound does not appear to be hurting as badly as it has in the past which is good news. No fevers, chills, nausea, or vomiting noted at this time. Objective Constitutional Well-nourished and well-hydrated in no acute distress. Vitals Time Taken: 10:57 AM, Height: 65 in, Weight: 131 lbs, BMI: 21.8, Temperature: 98.2 F, Pulse: 93 bpm, Respiratory Rate: 16 breaths/min, Blood Pressure: 138/63 mmHg. Respiratory normal breathing without difficulty. clear to auscultation bilaterally. Cardiovascular regular rate and rhythm with normal S1, S2. Psychiatric this patient is able to make decisions and demonstrates good insight into disease process. Alert and Oriented x 3. pleasant and cooperative. General Notes: Patient's wound is slough covered although she does have a granular tissue that is starting to show underneath. Fortunately she no longer has the thick eschar that she originally had. Integumentary (Hair, Skin) Wound #1 status is Open. Original cause of wound was Trauma. The wound is located on the Left,Anterior Lower Leg. The wound measures 6.5cm length x 1.2cm width x 0.1cm depth; 6.126cm^2 area and [name], [name]. ([id number]) 0.613cm^3 volume. There is Fat Layer (Subcutaneous Tissue) Exposed exposed. There is no tunneling or undermining noted. There is a medium amount of serous drainage noted. The wound margin is flat and intact. There is no granulation within the wound bed. There is a large (67-100%) amount of necrotic tissue within the wound bed including Adherent Slough. The periwound skin appearance exhibited: Scarring, Erythema. The periwound skin appearance did not exhibit: Callus, Crepitus, Excoriation,  Induration, Rash, Dry/Scaly, Maceration, Atrophie Blanche, Cyanosis, Ecchymosis, Hemosiderin Staining, Mottled, Pallor, Rubor. The surrounding wound skin color is noted with erythema which is circumferential. Periwound temperature was noted as No Abnormality. The periwound has tenderness on palpation. Assessment Active Problems ICD-10 E11.622 - Type 2 diabetes mellitus with other skin ulcer L97.222 - Non-pressure chronic ulcer of left calf with fat layer exposed I70.242 - Atherosclerosis of  native arteries of left leg with ulceration of calf Plan Wound Cleansing: Wound #1 Left,Anterior Lower Leg: Clean wound with Normal Saline. May Shower, gently pat wound dry prior to applying new dressing. Anesthetic: Wound #1 Left,Anterior Lower Leg: Topical Lidocaine 4% cream applied to wound bed prior to debridement Skin Barriers/Peri-Wound Care: Wound #1 Left,Anterior Lower Leg: Skin Prep Primary Wound Dressing: Wound #1 Left,Anterior Lower Leg: Santyl Ointment Secondary Dressing: Wound #1 Left,Anterior Lower Leg: Dry Gauze Boardered Foam Dressing Other - stretch netting #4 Dressing Change Frequency: Wound #1 Left,Anterior Lower Leg: Change dressing every day. Follow-up Appointments: [name], [name] ([id number]) Wound #1 Left,Anterior Lower Leg: Return Appointment in 1 week. Edema Control: Wound #1 Left,Anterior Lower Leg: Elevate legs to the level of the heart and pump ankles as often as possible Additional Orders / Instructions: Wound #1 Left,Anterior Lower Leg: Increase protein intake. Other: - Please add vitamin A, vitamin C and zinc supplements to your diet I'm going to recommend that we continue with the Santyl for the next week. We will see were things stand following. In the meantime she will have her appointment were vascular we will see for they have to say regarding her situation and the plan that they will come up with. If anything worsens in the interim she  will contact our office for additional recommendations. Otherwise please see above for specific wound care orders. Electronic Signature(s) Signed: 07/16/2017 3:50:36 PM By: Worthy Keeler PA-C Entered By: Worthy Keeler on 07/16/2017 12:52:04 Dowty, [name] ([id number]) -------------------------------------------------------------------------------- SuperBill Details Patient Name: Alexa Lowes [name]. Date of Service: 07/16/2017 Medical Record Number: [id number] Patient Account Number: [id number] Date of Birth/Sex: 11-Feb-1926 (83 y.o. 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 Facility Procedures CPT4 Code: [id number] Description: 99213 - WOUND CARE VISIT-LEV 3 EST PT Modifier: Quantity: 1 Physician Procedures CPT4 Code Description: [id number] 99213 - WC PHYS LEVEL 3 - EST PT ICD-10 Description Diagnosis E11.622 Type 2 diabetes mellitus with other skin ulcer L97.222 Non-pressure chronic ulcer of left calf with fat layer e T59.741 Atherosclerosis of native  arteries of left leg with ulce Modifier: xposed ration of ca Quantity: 1 lf Electronic Signature(s) Signed: 07/16/2017 3:50:36 PM By: Worthy Keeler PA-C Signed: 07/16/2017 4:38:16 PM By: Alric Quan Entered By: Alric Quan on 07/16/2017 12:57:52

## 2017-07-20 ENCOUNTER — Other Ambulatory Visit (INDEPENDENT_AMBULATORY_CARE_PROVIDER_SITE_OTHER): Payer: Self-pay

## 2017-07-20 ENCOUNTER — Encounter
Admission: RE | Admit: 2017-07-20 | Discharge: 2017-07-20 | Disposition: A | Discharge status: Home / Self Care | Payer: Medicare Other | Source: Ambulatory Visit | Attending: Vascular Surgery | Admitting: Vascular Surgery

## 2017-07-20 DIAGNOSIS — Z7984 Long term (current) use of oral hypoglycemic drugs: Secondary | ICD-10-CM | POA: Diagnosis not present

## 2017-07-20 DIAGNOSIS — E11621 Type 2 diabetes mellitus with foot ulcer: Secondary | ICD-10-CM | POA: Diagnosis not present

## 2017-07-20 DIAGNOSIS — I70248 Atherosclerosis of native arteries of left leg with ulceration of other part of lower left leg: Secondary | ICD-10-CM | POA: Diagnosis present

## 2017-07-20 DIAGNOSIS — L97829 Non-pressure chronic ulcer of other part of left lower leg with unspecified severity: Secondary | ICD-10-CM | POA: Diagnosis not present

## 2017-07-20 DIAGNOSIS — Z7902 Long term (current) use of antithrombotics/antiplatelets: Secondary | ICD-10-CM | POA: Diagnosis not present

## 2017-07-20 DIAGNOSIS — E785 Hyperlipidemia, unspecified: Secondary | ICD-10-CM | POA: Diagnosis not present

## 2017-07-20 DIAGNOSIS — I10 Essential (primary) hypertension: Secondary | ICD-10-CM | POA: Diagnosis not present

## 2017-07-20 DIAGNOSIS — Z9889 Other specified postprocedural states: Secondary | ICD-10-CM | POA: Diagnosis not present

## 2017-07-20 DIAGNOSIS — Z90710 Acquired absence of both cervix and uterus: Secondary | ICD-10-CM | POA: Diagnosis not present

## 2017-07-20 HISTORY — DX: Non-pressure chronic ulcer of unspecified calf with unspecified severity: L97.209

## 2017-07-20 HISTORY — DX: Gastro-esophageal reflux disease without esophagitis: K21.9

## 2017-07-20 LAB — CREATININE, SERUM
Creatinine, Ser: 1.42 mg/dL — ABNORMAL HIGH (ref 0.44–1.00)
GFR calc Af Amer: 37 mL/min — ABNORMAL LOW (ref >60)
GFR calc non Af Amer: 32 mL/min — ABNORMAL LOW (ref >60)

## 2017-07-20 LAB — BUN: BUN: 27 mg/dL — ABNORMAL HIGH (ref 6–20)

## 2017-07-21 MED ORDER — CEFAZOLIN SODIUM-DEXTROSE 1-4 GM/50ML-% IV SOLN: 1.0000 g | Freq: Once | INTRAVENOUS | Status: DC

## 2017-07-22 ENCOUNTER — Ambulatory Visit
Admission: RE | Admit: 2017-07-22 | Discharge: 2017-07-22 | Disposition: A | Discharge status: Home / Self Care | Payer: Medicare Other | Source: Ambulatory Visit | Attending: Vascular Surgery | Admitting: Vascular Surgery

## 2017-07-22 ENCOUNTER — Encounter
Admission: RE | Disposition: A | Discharge status: Home / Self Care | Payer: Self-pay | Source: Ambulatory Visit | Attending: Vascular Surgery

## 2017-07-22 DIAGNOSIS — Z7984 Long term (current) use of oral hypoglycemic drugs: Secondary | ICD-10-CM | POA: Insufficient documentation

## 2017-07-22 DIAGNOSIS — L97829 Non-pressure chronic ulcer of other part of left lower leg with unspecified severity: Secondary | ICD-10-CM | POA: Insufficient documentation

## 2017-07-22 DIAGNOSIS — I70248 Atherosclerosis of native arteries of left leg with ulceration of other part of lower left leg: Secondary | ICD-10-CM | POA: Insufficient documentation

## 2017-07-22 DIAGNOSIS — E785 Hyperlipidemia, unspecified: Secondary | ICD-10-CM | POA: Insufficient documentation

## 2017-07-22 DIAGNOSIS — Z7902 Long term (current) use of antithrombotics/antiplatelets: Secondary | ICD-10-CM | POA: Insufficient documentation

## 2017-07-22 DIAGNOSIS — I10 Essential (primary) hypertension: Secondary | ICD-10-CM | POA: Insufficient documentation

## 2017-07-22 DIAGNOSIS — Z90710 Acquired absence of both cervix and uterus: Secondary | ICD-10-CM | POA: Insufficient documentation

## 2017-07-22 DIAGNOSIS — E11621 Type 2 diabetes mellitus with foot ulcer: Secondary | ICD-10-CM | POA: Insufficient documentation

## 2017-07-22 DIAGNOSIS — Z9889 Other specified postprocedural states: Secondary | ICD-10-CM | POA: Insufficient documentation

## 2017-07-22 HISTORY — PX: LOWER EXTREMITY ANGIOGRAPHY: CATH118251

## 2017-07-22 LAB — GLUCOSE, CAPILLARY: Glucose-Capillary: 138 mg/dL — ABNORMAL HIGH (ref 65–99)

## 2017-07-22 SURGERY — LOWER EXTREMITY ANGIOGRAPHY
Anesthesia: Moderate Sedation | Laterality: Left

## 2017-07-22 MED ORDER — SODIUM CHLORIDE 0.9% FLUSH: 3.0000 mL | Freq: Two times a day (BID) | INTRAVENOUS | Status: DC

## 2017-07-22 MED ORDER — HEPARIN SODIUM (PORCINE) 1000 UNIT/ML IJ SOLN
INTRAMUSCULAR | Status: AC
  Filled 2017-07-22: qty 1

## 2017-07-22 MED ORDER — FENTANYL CITRATE (PF) 100 MCG/2ML IJ SOLN
INTRAMUSCULAR | Status: DC | PRN
  Administered 2017-07-22 (×2): 25 ug via INTRAVENOUS

## 2017-07-22 MED ORDER — IOPAMIDOL (ISOVUE-300) INJECTION 61%
INTRAVENOUS | Status: DC | PRN
  Administered 2017-07-22: 50 mL via INTRA_ARTERIAL

## 2017-07-22 MED ORDER — SODIUM CHLORIDE 0.9% FLUSH: 3.0000 mL | INTRAVENOUS | Status: DC | PRN

## 2017-07-22 MED ORDER — SODIUM CHLORIDE 0.9 % IV SOLN: 250.0000 mL | INTRAVENOUS | Status: DC | PRN

## 2017-07-22 MED ORDER — FENTANYL CITRATE (PF) 100 MCG/2ML IJ SOLN
INTRAMUSCULAR | Status: AC
  Filled 2017-07-22: qty 2

## 2017-07-22 MED ORDER — LIDOCAINE-EPINEPHRINE (PF) 2 %-1:200000 IJ SOLN
INTRAMUSCULAR | Status: AC
Start: 2017-07-22 — End: 2017-07-22
  Filled 2017-07-22: qty 20

## 2017-07-22 MED ORDER — HEPARIN (PORCINE) IN NACL 2-0.9 UNIT/ML-% IJ SOLN
INTRAMUSCULAR | Status: AC
  Filled 2017-07-22: qty 1000

## 2017-07-22 MED ORDER — CEFAZOLIN SODIUM-DEXTROSE 1-4 GM/50ML-% IV SOLN
INTRAVENOUS | Status: AC
  Filled 2017-07-22: qty 50

## 2017-07-22 MED ORDER — MIDAZOLAM HCL 5 MG/5ML IJ SOLN
INTRAMUSCULAR | Status: AC
  Filled 2017-07-22: qty 5

## 2017-07-22 MED ORDER — HYDRALAZINE HCL 20 MG/ML IJ SOLN: 5.0000 mg | INTRAMUSCULAR | Status: DC | PRN

## 2017-07-22 MED ORDER — SODIUM CHLORIDE 0.9 % IV SOLN: INTRAVENOUS | Status: DC

## 2017-07-22 MED ORDER — LABETALOL HCL 5 MG/ML IV SOLN: 10.0000 mg | INTRAVENOUS | Status: DC | PRN

## 2017-07-22 MED ORDER — HEPARIN SODIUM (PORCINE) 1000 UNIT/ML IJ SOLN
INTRAMUSCULAR | Status: DC | PRN
  Administered 2017-07-22: 4000 [IU] via INTRAVENOUS

## 2017-07-22 MED ORDER — ASPIRIN 81 MG PO CHEW
CHEWABLE_TABLET | ORAL | Status: AC
  Administered 2017-07-22: 81 mg
  Filled 2017-07-22: qty 1

## 2017-07-22 MED ORDER — ASPIRIN EC 81 MG PO TBEC
81.0000 mg | DELAYED_RELEASE_TABLET | Freq: Every day | ORAL | Status: DC
  Filled 2017-07-22: qty 1

## 2017-07-22 MED ORDER — MIDAZOLAM HCL 2 MG/2ML IJ SOLN
INTRAMUSCULAR | Status: DC | PRN
  Administered 2017-07-22 (×2): 1 mg via INTRAVENOUS

## 2017-07-22 MED ORDER — SODIUM CHLORIDE 0.9 % IV SOLN
Freq: Once | INTRAVENOUS | Status: AC
  Administered 2017-07-22: 09:00:00 via INTRAVENOUS

## 2017-07-22 SURGICAL SUPPLY — 20 items
BALLN DORADO 5X60X80 (BALLOONS) ×3
BALLN ULTRVRSE 5X80X75C (BALLOONS) ×3
BALLN ULTRVRSE 8X60X75C (BALLOONS) ×3
BALLOON DORADO 5X60X80 (BALLOONS) IMPLANT
BALLOON ULTRVRSE 5X80X75C (BALLOONS) IMPLANT
BALLOON ULTRVRSE 8X60X75C (BALLOONS) IMPLANT
CATH GLIDE EXCHANG 5FR (CATHETERS) ×2 IMPLANT
CATH IMAGER II S 5FR 65CM (MISCELLANEOUS) ×2 IMPLANT
DEVICE PRESTO INFLATION (MISCELLANEOUS) ×3 IMPLANT
DEVICE STARCLOSE SE CLOSURE (Vascular Products) ×3 IMPLANT
GLIDEWIRE ADV .035X180CM (WIRE) ×2 IMPLANT
PACK ANGIOGRAPHY (CUSTOM PROCEDURE TRAY) ×3 IMPLANT
SHEATH BRITE TIP 5FRX11 (SHEATH) ×2 IMPLANT
SHEATH BRITE TIP 6FR X 23 (SHEATH) ×2 IMPLANT
SHEATH PINNACLE ST 6F 45CM (SHEATH) ×2 IMPLANT
SHIELD RADPAD SCOOP 12X17 (MISCELLANEOUS) ×2 IMPLANT
STENT LIFESTREAM 6X37X80 (Permanent Stent) ×2 IMPLANT
STENT LIFESTREAM 7X26X80 (Permanent Stent) ×6 IMPLANT
WIRE J 3MM .035X145CM (WIRE) ×2 IMPLANT
WIRE MAGIC TORQUE 260C (WIRE) ×4 IMPLANT

## 2017-07-22 NOTE — Progress Notes (Signed)
Aspirin 81 mg to be given to Pt prior to d/c today, confirmed with Dr. Lucky Cowboy. Per MD Pt to start 81 mg aspirin po at home daily, start 07/23/2017. Medication teaching done with Pt and family prior to d/c.

## 2017-07-22 NOTE — H&P (Signed)
[facility] History & Physical Update  The patient was interviewed and re-examined.  The patient's previous History and Physical has been reviewed and is unchanged.  There is no change in the plan of care. We plan to proceed with the scheduled procedure.  Leotis Pain, MD  07/22/2017, 8:54 AM

## 2017-07-22 NOTE — Op Note (Signed)
[facility] Percutaneous Study/Intervention Procedural Note   Date of Surgery: 07/22/2017  Surgeon(s):Deajah Erkkila   Assistants:none  Pre-operative Diagnosis: PAD with ulceration left lower extremity  Post-operative diagnosis: Same  Procedure(s) Performed: 1. Ultrasound guidance for vascular access right femoral artery 2. Catheter placement into left common femoral artery from right femoral approach 3. Aortogram and selective left lower extremity angiogram 4. Percutaneous transluminal angioplasty of left common and external iliac arteries with 2 inflations of a 5 mm diameter by 8 cm length Lutonix drug-coated angioplasty balloon 5. Lifestream stent placement to the left external iliac artery with 6 mm diameter by 37 mm length covered stent  6.  Lifestream stent placement 2 to the left common iliac artery with both stents being 7 mm diameter by 26 mm length stents 7. StarClose closure device right femoral artery  EBL: 10 cc  Contrast: 50 cc  Fluoro Time: 12.4 minutes  Moderate Conscious Sedation Time: approximately 50 minutes using 2 mg of Versed and 50 mcg of Fentanyl  Indications: Patient is a 83 y.o.female with a nonhealing ulceration on the left leg. The patient has noninvasive study showing markedly reduced ABI of less than 0.5 and essentially no digital pressure on the left leg. The patient is brought in for angiography for further evaluation and potential treatment. Risks and benefits are discussed and informed consent is obtained  Procedure: The patient was identified and appropriate procedural time out was performed. The patient was then placed supine on the table and prepped and draped in the usual sterile fashion.Moderate conscious sedation was administered during a face to face encounter with the patient throughout the procedure with my supervision of the  RN administering medicines and monitoring the patient's vital signs, pulse oximetry, telemetry and mental status throughout from the start of the procedure until the patient was taken to the recovery room. Ultrasound was used to evaluate the right common femoral artery. It was patent. A digital ultrasound image was acquired. A Seldinger needle was used to access the right common femoral artery under direct ultrasound guidance and a permanent image was performed. A 0.035 J wire was advanced without resistance and a 5Fr sheath was placed. Pigtail catheter was placed into the aorta and an AP aortogram was performed. This demonstrated that the aorta was irregular with a mild stenosis but no stenosis of greater than 50%. The renal arteries appeared to have reasonably good flow.The right iliac system was calcified but had no significant stenosis. The left iliac system was highly diseased with 2 separate lesions about 2-3 cm apart in the left common iliac artery both being near occlusive and there stenosis. 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 The left external iliac artery had about a 60-70% stenosis. I then crossed the aortic bifurcation and advanced to the left femoral head. This was difficult secondary to the multiple iliac lesions that had to be crossed and only a glide catheter over a stiff wire wouldn't track through the lesions. This did confirm intraluminal flow in the proximal left common femoral artery. Selective left lower extremity angiogram was then performed. This demonstrated occlusion of the common femoral artery and the origins of the SFA and profunda femoris artery. Flow distally was poor, but there appeared to be multiple areas of high-grade stenosis or occlusion in the distal SFA and popliteal artery with only a diseased peroneal artery as runoff distally. Again imaging was difficult due to the very low flow state. The patient was systemically heparinized and a 6 [place] destination sheath  was then  placed over the Terum[employer] Advantage wire. I then exchanged for a Magic torque wire. The common femoral lesion was not really appropriate to treat with endovascular therapy, but I wanted to treat her inflow in the iliac system. A 5 mm diameter by 8 cm length Lutonix drug-coated angioplasty balloon was then used with the first inflation in the common iliac artery and with a second inflation in the external iliac artery to treat both lesions. These inflations were 12 atm for 1 minute. Completion angiogram showed greater than 50% residual stenosis in all 3 lesions. These were densely calcific and I elected to place covered stents. For the left external iliac artery lesion and a 6 mm diameter by 37 mm length Lifestream stent was deployed with its proximal portion at what was likely the iliac bifurcation although the hypogastric artery was occluded. To treat the left common iliac artery stenoses 7 mm diameter by 26 mm length Lifestream stents were used and 2 stents were required. A single longer stent was not selected as I did not want to upsize to a 7 [place] sheath given the small calcified right common femoral artery. The Lifestream stents were deployed bridging down to the external iliac artery stent and going up about 1-1-1/2 cm from the origin of the left common iliac artery right at the top of the lesion. I then postdilated the common iliac stents with an 8 mm diameter by 6 cm length balloon. Completion angiogram following these 3 stent placements showed less than 20% residual stenosis, and external iliac arteries with calcific plaque at the top of the most proximal stent although the degree of stenosis in this location appeared to be mild. There was good brisk flow. At this point, I felt we had improved her inflow see if her wound will heal. If it does not, left common femoral, profunda femoris, and SFA endarterectomy possibly combined with infrainguinal revascularization as a hybrid procedure would be considered. I  elected to terminate the procedure. The sheath was removed and StarClose closure device was deployed in the right femoral artery with excellent hemostatic result. The patient was taken to the recovery room in stable condition having tolerated the procedure well.  Findings:  Aortogram: the aorta was irregular with a mild stenosis but no stenosis of greater than 50%. The renal arteries appeared to have reasonably good flow.The right iliac system was calcified but had no significant stenosis. The left iliac system was highly diseased with 2 separate lesions about 2-3 cm apart in the left common iliac artery both being near occlusive and there stenosis. 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 The left external iliac artery had about a 60-70% stenosis. Left Lower Extremity: occlusion of the common femoral artery and the origins of the SFA and profunda femoris artery. Flow distally was poor, but there appeared to be multiple areas of high-grade stenosis or occlusion in the distal SFA and popliteal artery with only a diseased peroneal artery as runoff distally. Again imaging was difficult due to the very low flow state.   Disposition: Patient was taken to the recovery room in stable condition having tolerated the procedure well.  Complications: None  Leotis Pain 07/22/2017 11:00 AM   This note was created with Dragon Medical transcription system. Any errors in dictation are purely unintentional.

## 2017-07-23 ENCOUNTER — Encounter: Payer: Self-pay | Admitting: Vascular Surgery

## 2017-07-23 ENCOUNTER — Ambulatory Visit: Payer: Medicare Other | Admitting: Surgery

## 2017-07-28 ENCOUNTER — Encounter: Payer: Self-pay | Admitting: Vascular Surgery

## 2017-07-30 ENCOUNTER — Encounter: Payer: Medicare Other | Admitting: Surgery

## 2017-07-30 DIAGNOSIS — E11622 Type 2 diabetes mellitus with other skin ulcer: Secondary | ICD-10-CM | POA: Diagnosis not present

## 2017-08-02 NOTE — Progress Notes (Addendum)
[name], [name] ([id number]) Visit Report for 07/30/2017 Chief Complaint Document Details Patient Name: [name], [name] P. Date of Service: 07/30/2017 3:00 PM Medical Record Number: [id number] Patient Account Number: [id number] Date of Birth/Sex: November 27, 1926 (83 y.o. Female) Treating RN: Primary Care Provider: Lucianne Lei Other Clinician: Referring Provider: Lucianne Lei Treating Provider/Extender: Frann Rider in Treatment: 9 Information Obtained from: Patient Chief Complaint Patients presents for treatment of an open diabetic ulcer 2 the left lower anterior leg which she sustained an injury by blunt trauma Electronic Signature(s) Signed: 07/30/2017 4:06:43 PM By: Christin Fudge MD, FACS Entered By: Christin Fudge on 07/30/2017 16:03:08 Fenoglio, [place]. ([id number]) -------------------------------------------------------------------------------- Debridement Details Patient Name: Alexa Lowes P. Date of Service: 07/30/2017 3:00 PM Medical Record Number: [id number] Patient Account Number: [id number] Date of Birth/Sex: [birth year] (83 y.o. Female) Treating RN: Primary Care Provider: Lucianne Lei Other Clinician: Referring Provider: Lucianne Lei Treating Provider/Extender: Frann Rider in Treatment: 9 Debridement Performed for Wound #1 Left,Anterior Lower Leg Assessment: Performed By: Physician Christin Fudge, MD Debridement: Debridement Pre-procedure Verification/Time Out Yes - 15:48 Taken: Start Time: 15:49 Pain Control: Lidocaine 4% Topical Solution Level: Skin/Subcutaneous Tissue Total Area Debrided (L x 6.5 (cm) x 1.3 (cm) = 8.45 (cm) W): Tissue and other Viable, Non-Viable, Exudate, Fibrin/Slough, Subcutaneous material debrided: Instrument: Curette Bleeding: Minimum Hemostasis Achieved: Pressure End Time: 15:52 Procedural Pain: 0 Post Procedural Pain: 0 Response to Treatment: Procedure was tolerated well Post Debridement Measurements of  Total Wound Length: (cm) 6.5 Width: (cm) 1.3 Depth: (cm) 0.1 Volume: (cm) 0.664 Character of Wound/Ulcer Post Requires Further Debridement Debridement: Post Procedure Diagnosis Same as Pre-procedure Electronic Signature(s) Signed: 07/30/2017 4:06:43 PM By: Christin Fudge MD, FACS Entered By: Christin Fudge on 07/30/2017 16:02:52 [name], [name] ([id number]) -------------------------------------------------------------------------------- HPI Details Patient Name: Alexa Lowes P. Date of Service: 07/30/2017 3:00 PM Medical Record Number: [id number] Patient Account Number: [id number] Date of Birth/Sex: September 09, 1926 (83 y.o. Female) Treating RN: Primary Care Provider: Lucianne Lei Other Clinician: Referring Provider: Lucianne Lei Treating Provider/Extender: Frann Rider in Treatment: 9 History of Present Illness Location: left anterior shin Quality: Patient reports experiencing a dull pain to affected area(s). Severity: Patient states wound are getting worse. Duration: Patient has had the wound for < 3 weeks prior to presenting for treatment Timing: Pain in wound is Intermittent (comes and goes Context: The wound occurred when the patient blunt trauma against her left shin Modifying Factors: Other treatment(s) tried include:active Bactroban ointment locally Associated Signs and Symptoms: Patient reports having: occasional discharge from the wound HPI Description: 83 year old patient was seen in the ED about 10 days ago for a history of abrasion to the left lower extremity while she was boarding a bus and scraped the anterior part of her left leg. She has been a diabetic for many years and has been taking treatment regularly. Her past medical history is also significant for anemia arthritis constipation and hypertension and is status post abdominal hysterectomy. She has never been a smoker. after her ER visit she was advised to apply Bactroban ointment to the wound twice a  day and continue to monitor her blood glucose levels. her last hemoglobin A1c was done 3 years ago and was 6.6% 06/04/17 patient left anterior shin wound appears to be doing well although it is still somewhat dry despite the treatment with Medihoney. We have been using Kerlex following the Medihoney application and I believe this is just not retaining that much moisture. Nonetheless there is no evidence of infection. 06/11/17 on evaluation today  patient appears to be doing a little bit worse in regard to her wound. The entirety of the wound is dry and unfortunately the Medihoney does not seem to be helping this. That is even with the dressing changes that I made last week to try to retain more moisture. She has not tried [employer] as of yet. She was also noncompressible when testing for ABIs. 07/01/2017 -- she had a arterial studies done and was seen by Dr. Lucky Cowboy. her noninvasive studies showed noncompressible vessels on the right but brisk waveforms and digital pressures of 71 on the right consistent with only mild arterial insufficienc. On the left her ABI was 0.47 and this may be falsely elevated due to calcification. No digital pressures were obtained on the left and this is consistent with severe arterial insufficiency. this critical limb threatening situation on the left, would make wound healing difficult and it represents a serious situation and he has recommended an angiography with possible revascularization. The patient desired to defer the decision to she discuss with the family members. 07/09/17 on evaluation today patient appears to be doing fairly well in regard to her left anterior lower extremity wound. She has been tolerating the dressing changes without complication we have been utilizing Santyl at this time. She tells me that she is having no significant pain compared to what she has had in the past. She has decided after discussing with family that she is going to go forward with the  surgery with Dr. dew. This is to restore better blood flow to left lower extremity. With that it does appear that her lower extremity wound is making some progress albeit slow. No fevers, chills, nausea, or vomiting noted at this [facility], [name] ([id number]) time. 07/16/17 on evaluation today patient's wound appears to be doing roughly about the same although some of the slough is clearing off. [name] she has her appointment with vascular next week on Thursday the 23rd. With that being said her wound does not appear to be hurting as badly as it has in the past which is good news. No fevers, chills, nausea, or vomiting noted at this time. 07/30/2017 -- she had surgery on 07/22/2017 --indications being nonhealing ulcer on the left leg with a noninvasive study showing marked reduction in ABI of less than 0.5 and no digital pressure on her left leg. she had a percutaneous transluminal angioplasty of the left common and external iliac arteries. She then had a stent placed to the left external iliac arteries and to the left common iliac artery. 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 The flow distally was poor and there were multiple areas of high-grade stenosis or occlusion of the distal SFA and the popliteal artery with only a diseased peroneal artery as runoff distally. She has a postop appointment on September 23 Electronic Signature(s) Signed: 07/30/2017 4:06:43 PM By: Christin Fudge MD, FACS Previous Signature: 07/30/2017 3:31:36 PM Version By: Christin Fudge MD, FACS Entered By: Christin Fudge on 07/30/2017 16:03:39 [name] ([id number]) -------------------------------------------------------------------------------- Physical Exam Details Patient Name: Alexa Lowes P. Date of Service: 07/30/2017 3:00 PM Medical Record Number: [id number] Patient Account Number: [id number] Date of Birth/Sex:  [birth year] (83 y.o. Female) Treating RN: Primary Care Provider: Lucianne Lei Other Clinician: Referring Provider: Lucianne Lei Treating Provider/Extender: Frann Rider in Treatment: 9 Constitutional . Pulse regular. Respirations normal and unlabored. Afebrile. . Eyes Nonicteric. Reactive to light. Ears, Nose,  Mouth, and Throat Lips, teeth, and gums WNL.Marland Kitchen Moist mucosa without lesions. Neck supple and nontender. No palpable supraclavicular or cervical adenopathy. Normal sized without goiter. Respiratory WNL. No retractions.. Cardiovascular Pedal Pulses WNL. No clubbing, cyanosis or edema. Lymphatic No adneopathy. No adenopathy. No adenopathy. Musculoskeletal Adexa without tenderness or enlargement.. Digits and nails w/o clubbing, cyanosis, infection, petechiae, ischemia, or inflammatory conditions.. Integumentary (Hair, Skin) No suspicious lesions. No crepitus or fluctuance. No peri-wound warmth or erythema. No masses.Marland Kitchen Psychiatric Judgement and insight Intact.. No evidence of depression, anxiety, or agitation.. Notes sharp debridement was done with a #3 curet and bleeding controlled with pressure Electronic Signature(s) Signed: 07/30/2017 4:06:43 PM By: Christin Fudge MD, FACS Entered By: Christin Fudge on 07/30/2017 16:04:01 Farrier, [name] ([id number]) -------------------------------------------------------------------------------- Physician Orders Details Patient Name: Alexa Lowes P. Date of Service: 07/30/2017 3:00 PM Medical Record Number: [id number] Patient Account Number: [id number] Date of Birth/Sex: 1926/02/24 (83 y.o. Female) Treating RN: Carolyne Fiscal, Debi Primary Care Provider: Lucianne Lei Other Clinician: Referring Provider: Lucianne Lei Treating Provider/Extender: Frann Rider in Treatment: 9 Verbal / Phone Orders: No Diagnosis Coding Wound Cleansing Wound #1 Left,Anterior Lower Leg o Clean wound with Normal Saline. o May Shower, gently  pat wound dry prior to applying new dressing. Anesthetic Wound #1 Left,Anterior Lower Leg o Topical Lidocaine 4% cream applied to wound bed prior to debridement Skin Barriers/Peri-Wound Care Wound #1 Left,Anterior Lower Leg o Skin Prep Primary Wound Dressing Wound #1 Left,Anterior Lower Leg o Santyl Ointment Secondary Dressing Wound #1 Left,Anterior Lower Leg o Dry Gauze o Boardered Foam Dressing o Other - stretch netting #4 Dressing Change Frequency Wound #1 Left,Anterior Lower Leg o Change dressing every day. Follow-up Appointments Wound #1 Left,Anterior Lower Leg o Return Appointment in 1 week. Edema Control Wound #1 Left,Anterior Lower Leg o Elevate legs to the level of the heart and pump ankles as often as possible [name], [name] ([id number]) Additional Orders / Instructions Wound #1 Left,Anterior Lower Leg o Increase protein intake. o Other: - Please add vitamin A, vitamin C and zinc supplements to your diet Patient Medications Allergies: No Known Drug Allergies Notifications Medication Indication Start End Santyl 08/06/2017 DOSE topical 250 unit/gram ointment - ointment topical as directed Electronic Signature(s) Signed: 08/06/2017 4:20:14 PM By: Christin Fudge MD, FACS Previous Signature: 07/30/2017 4:06:43 PM Version By: Christin Fudge MD, FACS Previous Signature: 07/30/2017 4:51:13 PM Version By: Alric Quan Entered By: Christin Fudge on 08/06/2017 16:20:14 [name], [name] ([id number]) -------------------------------------------------------------------------------- Problem List Details Patient Name: Alexa Lowes P. Date of Service: 07/30/2017 3:00 PM Medical Record Number: [id number] Patient Account Number: [id number] Date of Birth/Sex: May 31, 1926 (83 y.o. 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 ([id number]) -------------------------------------------------------------------------------- Progress Note Details Patient Name: Alexa Lowes P. Date of Service: 07/30/2017 3:00 PM Medical Record Number: [id number] Patient Account Number: [id number] Date of Birth/Sex: 03/09/1926 (83 y.o. Female) Treating RN: Primary Care Provider: Lucianne Lei Other Clinician: Referring Provider: Lucianne Lei Treating Provider/Extender: Frann Rider in Treatment: 9 Subjective Chief  Complaint Information obtained from Patient Patients presents for treatment of an open diabetic ulcer 2 the left lower anterior leg which she sustained an injury by blunt trauma History of Present Illness (HPI) The following HPI elements were documented for the patient's wound: Location: left anterior shin Quality: Patient reports experiencing a dull pain to affected area(s). Severity: Patient states wound are getting worse. Duration: Patient has had the wound for < 3 weeks prior to presenting for treatment Timing: Pain in wound is Intermittent (comes and goes Context: The wound occurred when the patient blunt trauma against her left shin Modifying Factors: Other treatment(s) tried include:active Bactroban ointment locally Associated Signs and Symptoms: Patient reports having: occasional discharge from the wound 83 year old patient was seen in the ED about 10 days ago for a history of abrasion to the left  lower extremity while she was boarding a bus and scraped the anterior part of her left leg. She has been a diabetic for many years and has been taking treatment regularly. Her past medical history is also significant for anemia arthritis constipation and hypertension and is status post abdominal hysterectomy. She has never been a smoker. after her ER visit she was advised to apply Bactroban ointment to the wound twice a day and continue to monitor her blood glucose levels. her last hemoglobin A1c was done 3 years ago and was 6.6% 06/04/17 patient left anterior shin wound appears to be doing well although it is still somewhat dry despite the treatment with Medihoney. We have been using Kerlex following the Medihoney application and I believe this is just not retaining that much moisture. Nonetheless there is no evidence of infection. 06/11/17 on evaluation today patient appears to be doing a little bit worse in regard to her wound. The entirety of the wound is dry and unfortunately the Medihoney does not seem to be helping this. That is even with the dressing changes that I made last week to try to retain more moisture. She has not tried [employer] as of yet. She was also noncompressible when testing for ABIs. 07/01/2017 -- she had a arterial studies done and was seen by Dr. Lucky Cowboy. her noninvasive studies showed noncompressible vessels on the right but brisk waveforms and digital pressures of 71 on the right consistent with only mild arterial insufficienc. On the left her ABI was 0.47 and this may be falsely elevated due to calcification. No digital pressures were obtained on the left and this is consistent with severe arterial insufficiency. this critical limb threatening situation on the left, would make wound healing difficult and it [name], [name] ([id number]) represents a serious situation and he has recommended an angiography with possible revascularization. The patient desired to defer the  decision to she discuss with the family members. 07/09/17 on evaluation today patient appears to be doing fairly well in regard to her left anterior lower extremity wound. She has been tolerating the dressing changes without complication we have been utilizing Santyl at this time. She tells me that she is having no significant pain compared to what she has had in the past. She has decided after discussing with family that she is going to go forward with the surgery with Dr. dew. This is to restore better blood flow to left lower extremity. With that it does appear that her lower extremity wound is making some progress albeit slow. No fevers, chills, nausea, or vomiting noted at this time. 07/16/17 on evaluation today patient's wound appears to be doing roughly about the same  although some of the slough is clearing off. [name] she has her appointment with vascular next week on Thursday the 23rd. With that being said her wound does not appear to be hurting as badly as it has in the past which is good news. No fevers, chills, nausea, or vomiting noted at this time. 07/30/2017 -- she had surgery on 07/22/2017 --indications being nonhealing ulcer on the left leg with a noninvasive study showing marked reduction in ABI of less than 0.5 and no digital pressure on her left leg. she had a percutaneous transluminal angioplasty of the left common and external iliac arteries. She then had a stent placed to the left external iliac arteries and to the left common iliac artery. 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 The flow distally was poor and there were multiple areas of high-grade stenosis or occlusion of the distal SFA and the popliteal artery with only a diseased peroneal artery as runoff distally. She has a postop appointment on September 23 Objective Constitutional Pulse regular. Respirations normal and unlabored.  Afebrile. Vitals Time Taken: 3:33 PM, Height: 65 in, Weight: 131 lbs, BMI: 21.8, Temperature: 97.9 F, Pulse: 89 bpm, Respiratory Rate: 16 breaths/min, Blood Pressure: 153/68 mmHg. Eyes Nonicteric. Reactive to light. Ears, Nose, Mouth, and Throat Lips, teeth, and gums WNL.Marland Kitchen Moist mucosa without lesions. Neck [name], [name] ([id number]) supple and nontender. No palpable supraclavicular or cervical adenopathy. Normal sized without goiter. Respiratory WNL. No retractions.. Cardiovascular Pedal Pulses WNL. No clubbing, cyanosis or edema. Lymphatic No adneopathy. No adenopathy. No adenopathy. Musculoskeletal Adexa without tenderness or enlargement.. Digits and nails w/o clubbing, cyanosis, infection, petechiae, ischemia, or inflammatory conditions.Marland Kitchen Psychiatric Judgement and insight Intact.. No evidence of depression, anxiety, or agitation.. General Notes: sharp debridement was done with a #3 curet and bleeding controlled with pressure Integumentary (Hair, Skin) No suspicious lesions. No crepitus or fluctuance. No peri-wound warmth or erythema. No masses.. Wound #1 status is Open. Original cause of wound was Trauma. The wound is located on the Left,Anterior Lower Leg. The wound measures 6.5cm length x 1.3cm width x 0.1cm depth; 6.637cm^2 area and 0.664cm^3 volume. There is Fat Layer (Subcutaneous Tissue) Exposed exposed. There is no tunneling or undermining noted. There is a large amount of serous drainage noted. The wound margin is flat and intact. There is no granulation within the wound bed. There is a large (67-100%) amount of necrotic tissue within the wound bed including Adherent Slough. The periwound skin appearance exhibited: Scarring, Erythema. The periwound skin appearance did not exhibit: Callus, Crepitus, Excoriation, Induration, Rash, Dry/Scaly, Maceration, Atrophie Blanche, Cyanosis, Ecchymosis, Hemosiderin Staining, Mottled, Pallor, Rubor. The surrounding wound skin  color is noted with erythema which is circumferential. Periwound temperature was noted as No Abnormality. The periwound has tenderness on palpation. 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 ([id number]) Procedures Wound #1 Pre-procedure diagnosis of Wound #1 is a Trauma, Other located on the Left,Anterior Lower Leg . There was a Skin/Subcutaneous Tissue Debridement ([id number]) debridement with total area of 8.45 sq cm performed by Christin Fudge, MD. with the following instrument(s): Curette to remove Viable and Non-Viable tissue/material including Exudate, Fibrin/Slough, and Subcutaneous after achieving pain control using Lidocaine 4% Topical Solution. A time out was  conducted at 15:48, prior to the start of the procedure. A Minimum amount of bleeding was controlled with Pressure. The procedure was tolerated well with a pain level of 0 throughout and a pain level of 0 following the procedure. Post Debridement Measurements: 6.5cm length x 1.3cm width x 0.1cm depth; 0.664cm^3 volume. Character of Wound/Ulcer Post Debridement requires further debridement. Post procedure Diagnosis Wound #1: Same as Pre-Procedure Plan Wound Cleansing: Wound #1 Left,Anterior Lower Leg: Clean wound with Normal Saline. May Shower, gently pat wound dry prior to applying new dressing. Anesthetic: Wound #1 Left,Anterior Lower Leg: Topical Lidocaine 4% cream applied to wound bed prior to debridement Skin Barriers/Peri-Wound Care: Wound #1 Left,Anterior Lower Leg: Skin Prep Primary Wound Dressing: Wound #1 Left,Anterior Lower Leg: Santyl Ointment Secondary Dressing: Wound #1 Left,Anterior Lower Leg: Dry Gauze Boardered Foam Dressing Other - stretch netting #4 Dressing Change  Frequency: Wound #1 Left,Anterior Lower Leg: Change dressing every day. Follow-up Appointments: Wound #1 Left,Anterior Lower Leg: Return Appointment in 1 week. Edema Control: Wound #1 Left,Anterior Lower Leg: [name], [name] ([id number]) Elevate legs to the level of the heart and pump ankles as often as possible Additional Orders / Instructions: Wound #1 Left,Anterior Lower Leg: Increase protein intake. Other: - Please add vitamin A, vitamin C and zinc supplements to your diet The following medication(s) was prescribed: Santyl topical 250 unit/gram ointment ointment topical as directed starting 08/06/2017 I have reviewed her surgical procedure and follow-up and after review and sharp debridement today, I have recommended: 1. Daily washing with soap and water and application of Santyl ointment locally with a light Kerlix dressing. 2. good control of her diabetes mellitus 3. Adequate protein, vitamin A, vitamin C and zinc 4. Regular visits the wound center Electronic Signature(s) Signed: 08/09/2017 4:13:33 PM By: Christin Fudge MD, FACS Previous Signature: 07/30/2017 4:06:43 PM Version By: Christin Fudge MD, FACS Entered By: Christin Fudge on 08/06/2017 16:20:25 [name], [name] ([id number]) -------------------------------------------------------------------------------- SuperBill Details Patient Name: Alexa Lowes P. Date of Service: 07/30/2017 Medical Record Number: [id number] Patient Account Number: [id number] Date of Birth/Sex: 04-29-1926 (83 y.o. 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 Facility Procedures CPT4 Code Description: [id number] 11042 - DEB SUBQ TISSUE 20 SQ CM/< ICD-10 Description Diagnosis E11.622 Type 2 diabetes mellitus with other skin ulcer L97.222 Non-pressure chronic ulcer of left calf with fat layer e L38.101 Atherosclerosis of native  arteries of left leg with ulce Modifier: xposed ration of ca Quantity: 1 lf Physician Procedures CPT4 Code Description: [id number] 99213 - WC PHYS LEVEL 3 - EST PT ICD-10 Description Diagnosis E11.622 Type 2 diabetes mellitus with other skin ulcer L97.222 Non-pressure chronic ulcer of left calf with fat layer e N27.782 Atherosclerosis of native  arteries of left leg with ulce Modifier: xposed ration of ca Quantity: 1 lf CPT4 Code Description: [id number] 11042 - WC PHYS SUBQ TISS 20 SQ CM ICD-10 Description Diagnosis E11.622 Type 2 diabetes mellitus with other skin ulcer L97.222 Non-pressure chronic ulcer of left calf with fat layer e 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 (  [id number]) Entered By: Christin Fudge on 07/30/2017 16:05:22

## 2017-08-03 NOTE — Progress Notes (Signed)
[name], [name] ([id number]) Visit Report for 07/30/2017 Arrival Information Details Patient Name: [name], [name] Date of Service: 07/30/2017 3:00 PM Medical Record Number: [id number] Patient Account Number: [id number] Date of Birth/Sex: Aug 02, 1926 (83 y.o. Female) Treating RN: Carolyne Fiscal, Debi Primary Care Amariyana Heacox: Lucianne Lei Other Clinician: Referring Dayshon Roback: Lucianne Lei Treating Zakhari Fogel/Extender: Frann Rider in Treatment: 9 Visit Information History Since Last Visit All ordered tests and consults were completed: No Patient Arrived: [name] Added or deleted any medications: No Arrival Time: 15:25 Any new allergies or adverse reactions: No Accompanied By: granddaughter Had a fall or experienced change in No Transfer Assistance: None activities of daily living that may affect Patient Identification Verified: Yes risk of falls: Secondary Verification Process Yes Signs or symptoms of abuse/neglect since last No Completed: visito Patient Requires Transmission- No Hospitalized since last visit: No Based Precautions: Has Dressing in Place as Prescribed: Yes Patient Has Alerts: Yes Pain Present Now: No Electronic Signature(s) Signed: 07/30/2017 4:51:13 PM By: Alric Quan Entered By: Alric Quan on 07/30/2017 15:26:49 [name], [name] ([id number]) -------------------------------------------------------------------------------- Encounter Discharge Information Details Patient Name: Alexa Lowes P. Date of Service: 07/30/2017 3:00 PM Medical Record Number: [id number] Patient Account Number: [id number] Date of Birth/Sex: 02-Oct-1926 (83 y.o. Female) Treating RN: Carolyne Fiscal, Debi Primary Care Meghanne Pletz: Lucianne Lei Other Clinician: Referring Katelee Schupp: Lucianne Lei Treating Leeya Rusconi/Extender: Frann Rider in Treatment: 9 Encounter Discharge Information Items Discharge Pain Level: 0 Discharge Condition: Stable Ambulatory Status:  Cane Discharge Destination: Home Transportation: Private Auto Accompanied By: granddaughter Schedule Follow-up Appointment: Yes Medication Reconciliation completed and provided to No Patient/Care Obert Espindola: Provided on Clinical Summary of Care: 07/30/2017 Form Type Recipient Paper Patient GW Electronic Signature(s) Signed: 08/03/2017 9:43:33 AM By: Ruthine Dose Entered By: Ruthine Dose on 07/30/2017 16:00:30 [name] ([id number]) -------------------------------------------------------------------------------- Lower Extremity Assessment Details Patient Name: Alexa Lowes P. Date of Service: 07/30/2017 3:00 PM Medical Record Number: [id number] Patient Account Number: [id number] Date of Birth/Sex: September 25, 1926 (83 y.o. Female) Treating RN: Carolyne Fiscal, Debi Primary Care Hamad Whyte: Lucianne Lei Other Clinician: Referring Emylia Latella: Lucianne Lei Treating Rashan Rounsaville/Extender: Frann Rider in Treatment: 9 Vascular Assessment Pulses: Dorsalis Pedis Palpable: [Left:Yes] Posterior Tibial Extremity colors, hair growth, and conditions: Extremity Color: [Left:Hyperpigmented] Temperature of Extremity: [Left:Warm] Capillary Refill: [Left:< 3 seconds] Toe Nail Assessment Left: Right: Thick: Yes Discolored: No Deformed: No Improper Length and Hygiene: No Electronic Signature(s) Signed: 07/30/2017 4:51:13 PM By: Alric Quan Entered By: Alric Quan on 07/30/2017 15:30:52 [name], [name] ([id number]) -------------------------------------------------------------------------------- Multi Wound Chart Details Patient Name: Alexa Lowes P. Date of Service: 07/30/2017 3:00 PM Medical Record Number: [id number] Patient Account Number: [id number] Date of Birth/Sex: [birth year] (83 y.o. Female) Treating RN: Carolyne Fiscal, Debi Primary Care Abhiraj Dozal: Lucianne Lei Other Clinician: Referring Avin Upperman: Lucianne Lei Treating Sota Hetz/Extender: Frann Rider in  Treatment: 9 Vital Signs Height(in): 65 Pulse(bpm): 89 Weight(lbs): 131 Blood Pressure 153/68 (mmHg): Body Mass Index(BMI): 22 Temperature(F): 97.9 Respiratory Rate 16 (breaths/min): Photos: [1:No Photos] [N/A:N/A] Wound Location: [1:Left Lower Leg - Anterior] [N/A:N/A] Wounding Event: [1:Trauma] [N/A:N/A] Primary Etiology: [1:Trauma, Other] [N/A:N/A] Comorbid History: [1:Anemia, Hypertension, Type II Diabetes, Osteoarthritis] [N/A:N/A] Date Acquired: [1:05/02/2017] [N/A:N/A] Weeks of Treatment: [1:9] [N/A:N/A] Wound Status: [1:Open] [N/A:N/A] Measurements L x W x D 6.5x1.3x0.1 [N/A:N/A] (cm) Area (cm) : [1:6.637] [N/A:N/A] Volume (cm) : [1:0.664] [N/A:N/A] % Reduction in Area: [1:-38.10%] [N/A:N/A] % Reduction in Volume: 30.90% [N/A:N/A] Classification: [1:Full Thickness Without Exposed Support Structures] [N/A:N/A] Exudate Amount: [1:Large] [N/A:N/A] Exudate Type: [1:Serous] [N/A:N/A] Exudate Color: [1:amber] [N/A:N/A] Wound Margin: [1:Flat and Intact] [N/A:N/A] Granulation  Amount: [1:None Present (0%)] [N/A:N/A] Necrotic Amount: [1:Large (67-100%)] [N/A:N/A] Exposed Structures: [1:Fat Layer (Subcutaneous Tissue) Exposed: Yes Fascia: No Tendon: No Muscle: No] [N/A:N/A] Joint: No Bone: No Epithelialization: None N/A N/A Debridement: Debridement (17616- N/A N/A 11047) Pre-procedure 15:48 N/A N/A Verification/Time Out Taken: Pain Control: Lidocaine 4% Topical N/A N/A Solution Tissue Debrided: Fibrin/Slough, Exudates, N/A N/A Subcutaneous Level: Skin/Subcutaneous N/A N/A Tissue Debridement Area (sq 8.45 N/A N/A cm): Instrument: Curette N/A N/A Bleeding: Minimum N/A N/A Hemostasis Achieved: Pressure N/A N/A Procedural Pain: 0 N/A N/A Post Procedural Pain: 0 N/A N/A Debridement Treatment Procedure was tolerated N/A N/A Response: well Post Debridement 6.5x1.3x0.1 N/A N/A Measurements L x W x D (cm) Post Debridement 0.664 N/A N/A Volume: (cm) Periwound  Skin Texture: Scarring: Yes N/A N/A Excoriation: No Induration: No Callus: No Crepitus: No Rash: No Periwound Skin Maceration: No N/A N/A Moisture: Dry/Scaly: No Periwound Skin Color: Erythema: Yes N/A N/A Atrophie Blanche: No Cyanosis: No Ecchymosis: No Hemosiderin Staining: No Mottled: No Pallor: No Rubor: No Erythema Location: Circumferential N/A N/A Temperature: No Abnormality N/A N/A Tenderness on Yes N/A N/A Palpation: Wound Preparation: Ulcer Cleansing: N/A N/A Rinsed/Irrigated with Ambs, Dulcy P. ([id number]) Saline Topical Anesthetic Applied: Other: lidocaine 4% Procedures Performed: Debridement N/A N/A Treatment Notes Wound #1 (Left, Anterior Lower Leg) 1. Cleansed with: Clean wound with Normal Saline 2. Anesthetic Topical Lidocaine 4% cream to wound bed prior to debridement 3. Peri-wound Care: Skin Prep 4. Dressing Applied: Santyl Ointment 5. Secondary Dressing Applied Bordered Foam Dressing Dry Gauze Notes netting Electronic Signature(s) Signed: 07/30/2017 4:06:43 PM By: Christin Fudge MD, FACS Entered By: Christin Fudge on 07/30/2017 16:02:25 Silfies, [name] ([id number]) -------------------------------------------------------------------------------- [facility] [place] Details Patient Name: Alexa Lowes P. Date of Service: 07/30/2017 3:00 PM Medical Record Number: [id number] Patient Account Number: [id number] Date of Birth/Sex: [birth year] (83 y.o. Female) Treating RN: Carolyne Fiscal, Debi Primary Care Judyth Demarais: Lucianne Lei Other Clinician: Referring Sairah Knobloch: Lucianne Lei Treating Kelsa Jaworowski/Extender: Frann Rider in Treatment: 9 Active Inactive ` Abuse / Safety / Falls / Self Care Management Nursing Diagnoses: Potential for falls Goals: Patient will remain injury free related to falls Date Initiated: 05/28/2017 Target Resolution Date: 07/23/2017 Goal Status: Active Interventions: Assess fall risk on admission and as  needed Notes: ` Orientation to the Wound Care Program Nursing Diagnoses: Knowledge deficit related to the wound healing center program Goals: Patient/caregiver will verbalize understanding of the [facility] Date Initiated: 05/28/2017 Target Resolution Date: 07/23/2017 Goal Status: Active Interventions: Provide education on orientation to the wound center Notes: ` Wound/Skin Impairment Nursing Diagnoses: Impaired tissue integrity [name], [name] ([id number]) Goals: Ulcer/skin breakdown will have a volume reduction of 30% by week 4 Date Initiated: 05/28/2017 Target Resolution Date: 07/23/2017 Goal Status: Active Ulcer/skin breakdown will have a volume reduction of 50% by week 8 Date Initiated: 05/28/2017 Target Resolution Date: 07/23/2017 Goal Status: Active Ulcer/skin breakdown will have a volume reduction of 80% by week 12 Date Initiated: 05/28/2017 Target Resolution Date: 07/23/2017 Goal Status: Active Ulcer/skin breakdown will heal within 14 weeks Date Initiated: 05/28/2017 Target Resolution Date: 07/23/2017 Goal Status: Active Interventions: Assess patient/caregiver ability to obtain necessary supplies Assess patient/caregiver ability to perform ulcer/skin care regimen upon admission and as needed Assess ulceration(s) every visit Notes: Electronic Signature(s) Signed: 07/30/2017 4:51:13 PM By: Alric Quan Entered By: Alric Quan on 07/30/2017 15:47:48 [name], [name] ([id number]) -------------------------------------------------------------------------------- Pain Assessment Details Patient Name: Alexa Lowes P. Date of Service: 07/30/2017 3:00 PM Medical Record Number: [id number] Patient Account Number:  [id number] Date of Birth/Sex: 02-27-1926 (83 y.o. Female) Treating RN: Carolyne Fiscal, Debi Primary Care Brigida Scotti: Lucianne Lei Other Clinician: Referring Jaquetta Currier: Lucianne Lei Treating Aylen Rambert/Extender: Frann Rider in  Treatment: 9 Active Problems Location of Pain Severity and Description of Pain Patient Has Paino No Site Locations Pain Management and Medication Current Pain Management: Electronic Signature(s) Signed: 07/30/2017 4:51:13 PM By: Alric Quan Entered By: Alric Quan on 07/30/2017 15:26:58 [place], [name] ([id number]) -------------------------------------------------------------------------------- Patient/Caregiver Education Details Patient Name: Alexa Lowes P. Date of Service: 07/30/2017 3:00 PM Medical Record Number: [id number] Patient Account Number: [id number] Date of Birth/Gender: 12-10-1925 (83 y.o. Female) Treating RN: Carolyne Fiscal, Debi Primary Care Physician: Lucianne Lei Other Clinician: Referring Physician: Lucianne Lei Treating Physician/Extender: Frann Rider in Treatment: 9 Education Assessment Education Provided To: Patient Education Topics Provided Wound/Skin Impairment: Handouts: Other: change dressing as ordered Methods: Demonstration, Explain/Verbal Responses: State content correctly Electronic Signature(s) Signed: 07/30/2017 4:51:13 PM By: Alric Quan Entered By: Alric Quan on 07/30/2017 15:52:59 [name], [name] ([id number]) -------------------------------------------------------------------------------- Wound Assessment Details Patient Name: Alexa Lowes P. Date of Service: 07/30/2017 3:00 PM Medical Record Number: [id number] Patient Account Number: [id number] Date of Birth/Sex: Sep 27, 1926 (83 y.o. Female) Treating RN: Carolyne Fiscal, Debi Primary Care Alvis Edgell: Lucianne Lei Other Clinician: Referring Kinslea Frances: Lucianne Lei Treating Analyah Mcconnon/Extender: Frann Rider in Treatment: 9 Wound Status Wound Number: 1 Primary Trauma, Other Etiology: Wound Location: Left Lower Leg - Anterior Wound Open Wounding Event: Trauma Status: Date Acquired: 05/02/2017 Comorbid Anemia, Hypertension, Type II Weeks Of Treatment:  9 History: Diabetes, Osteoarthritis Clustered Wound: No Photos Photo Uploaded By: Alric Quan on 07/30/2017 16:25:35 Wound Measurements Length: (cm) 6.5 Width: (cm) 1.3 Depth: (cm) 0.1 Area: (cm) 6.637 Volume: (cm) 0.664 % Reduction in Area: -38.1% % Reduction in Volume: 30.9% Epithelialization: None Tunneling: No Undermining: No Wound Description Full Thickness Without Exposed Foul Odor Afte Classification: Support Structures Slough/Fibrino Wound Margin: Flat and Intact Exudate Large Amount: Exudate Type: Serous Exudate Color: amber r Cleansing: No Yes Wound Bed Granulation Amount: None Present (0%) Exposed Structure Necrotic Amount: Large (67-100%) Fascia Exposed: No Necrotic Quality: Adherent Slough Fat Layer (Subcutaneous Tissue) Exposed: Yes [name], [name] ([id number]) Tendon Exposed: No Muscle Exposed: No Joint Exposed: No Bone Exposed: No Periwound Skin Texture Texture Color No Abnormalities Noted: No No Abnormalities Noted: No Callus: No Atrophie Blanche: No Crepitus: No Cyanosis: No Excoriation: No Ecchymosis: No Induration: No Erythema: Yes Rash: No Erythema Location: Circumferential Scarring: Yes Hemosiderin Staining: No Mottled: No Moisture Pallor: No No Abnormalities Noted: No Rubor: No Dry / Scaly: No Maceration: No Temperature / Pain Temperature: No Abnormality Tenderness on Palpation: Yes Wound Preparation Ulcer Cleansing: Rinsed/Irrigated with Saline Topical Anesthetic Applied: Other: lidocaine 4%, Treatment Notes Wound #1 (Left, Anterior Lower Leg) 1. Cleansed with: Clean wound with Normal Saline 2. Anesthetic Topical Lidocaine 4% cream to wound bed prior to debridement 3. Peri-wound Care: Skin Prep 4. Dressing Applied: Santyl Ointment 5. Secondary Dressing Applied Bordered Foam Dressing Dry Gauze Notes netting Electronic Signature(s) Signed: 07/30/2017 4:51:13 PM By: Alric Quan Entered By:  Alric Quan on 07/30/2017 15:30:11 [name], [name] ([id number]) -------------------------------------------------------------------------------- Vitals Details Patient Name: Alexa Lowes P. Date of Service: 07/30/2017 3:00 PM Medical Record Number: [id number] Patient Account Number: [id number] Date of Birth/Sex: 07-Jun-1926 (83 y.o. Female) Treating RN: Carolyne Fiscal, Debi Primary Care Caz Weaver: Lucianne Lei Other Clinician: Referring Hassen Bruun: Lucianne Lei Treating Terricka Onofrio/Extender: Frann Rider in Treatment: 9 Vital Signs Time Taken: 15:33 Temperature (F): 97.9 Height (in): 65 Pulse (bpm): 89 Weight (lbs): 131  Respiratory Rate (breaths/min): 16 Body Mass Index (BMI): 21.8 Blood Pressure (mmHg): 153/68 Reference Range: 80 - 120 mg / dl Electronic Signature(s) Signed: 07/30/2017 4:51:13 PM By: Alric Quan Entered By: Alric Quan on 07/30/2017 15:33:58

## 2017-08-04 ENCOUNTER — Other Ambulatory Visit (INDEPENDENT_AMBULATORY_CARE_PROVIDER_SITE_OTHER): Payer: Self-pay | Admitting: Vascular Surgery

## 2017-08-04 DIAGNOSIS — I739 Peripheral vascular disease, unspecified: Secondary | ICD-10-CM

## 2017-08-09 ENCOUNTER — Encounter: Payer: Medicare Other | Attending: Surgery | Admitting: Surgery

## 2017-08-09 DIAGNOSIS — K5900 Constipation, unspecified: Secondary | ICD-10-CM | POA: Diagnosis not present

## 2017-08-09 DIAGNOSIS — L97222 Non-pressure chronic ulcer of left calf with fat layer exposed: Secondary | ICD-10-CM | POA: Insufficient documentation

## 2017-08-09 DIAGNOSIS — M1990 Unspecified osteoarthritis, unspecified site: Secondary | ICD-10-CM | POA: Diagnosis not present

## 2017-08-09 DIAGNOSIS — E11622 Type 2 diabetes mellitus with other skin ulcer: Secondary | ICD-10-CM | POA: Diagnosis present

## 2017-08-09 DIAGNOSIS — D649 Anemia, unspecified: Secondary | ICD-10-CM | POA: Diagnosis not present

## 2017-08-09 DIAGNOSIS — I70242 Atherosclerosis of native arteries of left leg with ulceration of calf: Secondary | ICD-10-CM | POA: Diagnosis not present

## 2017-08-09 DIAGNOSIS — I10 Essential (primary) hypertension: Secondary | ICD-10-CM | POA: Diagnosis not present

## 2017-08-10 NOTE — Progress Notes (Signed)
[name] ([id number]) Visit Report for 08/09/2017 Arrival Information Details Patient Name: [name], [name] Date of Service: 08/09/2017 3:15 PM Medical Record Number: [id number] Patient Account Number: [id number] Date of Birth/Sex: 1926/01/18 (83 y.o. Female) Treating RN: Carolyne Fiscal, Debi Primary Care Julane Crock: Lucianne Lei Other Clinician: Referring Laurie Penado: Lucianne Lei Treating Toniya Rozar/Extender: Frann Rider in Treatment: 10 Visit Information History Since Last Visit All ordered tests and consults were completed: No Patient Arrived: [name] Added or deleted any medications: No Arrival Time: 15:07 Any new allergies or adverse reactions: No Accompanied By: granddaughter Had a fall or experienced change in No Transfer Assistance: None activities of daily living that may affect Patient Identification Verified: Yes risk of falls: Secondary Verification Process Yes Signs or symptoms of abuse/neglect since last No Completed: visito Patient Requires Transmission- No Hospitalized since last visit: No Based Precautions: Has Dressing in Place as Prescribed: Yes Patient Has Alerts: Yes Pain Present Now: No Electronic Signature(s) Signed: 08/09/2017 4:52:43 PM By: Alric Quan Entered By: Alric Quan on 08/09/2017 15:08:26 Olvey, JJKKXFGH W. ([id number]) -------------------------------------------------------------------------------- Encounter Discharge Information Details Patient Name: Alexa Lowes P. Date of Service: 08/09/2017 3:15 PM Medical Record Number: [id number] Patient Account Number: [id number] Date of Birth/Sex: [birth year] (83 y.o. Female) Treating RN: Carolyne Fiscal, Debi Primary Care Jarmon Javid: Lucianne Lei Other Clinician: Referring Micky Overturf: Lucianne Lei Treating Gustavo Dispenza/Extender: Frann Rider in Treatment: 10 Encounter Discharge Information Items Discharge Pain Level: 0 Discharge Condition: Stable Ambulatory Status:  Cane Discharge Destination: Home Transportation: Private Auto Accompanied By: granddaughter Schedule Follow-up Appointment: Yes Medication Reconciliation completed and provided to No Patient/Care Juanelle Trueheart: Provided on Clinical Summary of Care: 08/09/2017 Form Type Recipient Paper Patient GW Electronic Signature(s) Signed: 08/10/2017 8:13:46 AM By: Ruthine Dose Entered By: Ruthine Dose on 08/09/2017 15:33:58 Hetzer, [name] ([id number]) -------------------------------------------------------------------------------- Lower Extremity Assessment Details Patient Name: Alexa Lowes P. Date of Service: 08/09/2017 3:15 PM Medical Record Number: [id number] Patient Account Number: [id number] Date of Birth/Sex: [birth year] (83 y.o. Female) Treating RN: Carolyne Fiscal, Debi Primary Care Mynor Witkop: Lucianne Lei Other Clinician: Referring Adanely Reynoso: Lucianne Lei Treating Randy Whitener/Extender: Frann Rider in Treatment: 10 Edema Assessment Assessed: [Left: No] [Right: No] E[Left: dema] [Right: :] Calf Left: Right: Point of Measurement: 34 cm From Medial Instep 28.5 cm cm Ankle Left: Right: Point of Measurement: 8 cm From Medial Instep 20.5 cm cm Vascular Assessment Pulses: Dorsalis Pedis Palpable: [Left:Yes] Posterior Tibial Extremity colors, hair growth, and conditions: Extremity Color: [Left:Hyperpigmented] Temperature of Extremity: [Left:Cool] Capillary Refill: [Left:< 3 seconds] Toe Nail Assessment Left: Right: Thick: Yes Discolored: No Deformed: No Improper Length and Hygiene: No Electronic Signature(s) Signed: 08/09/2017 4:52:43 PM By: Alric Quan Entered By: Alric Quan on 08/09/2017 15:17:27 Gerrard, Amyra P. ([id number]) -------------------------------------------------------------------------------- Multi Wound Chart Details Patient Name: Alexa Lowes P. Date of Service: 08/09/2017 3:15 PM Medical Record Number: [id number] Patient Account  Number: [id number] Date of Birth/Sex: [birth year] (83 y.o. Female) Treating RN: Carolyne Fiscal, Debi Primary Care Bobby Ragan: Lucianne Lei Other Clinician: Referring Cloe Sockwell: Lucianne Lei Treating Tavarus Poteete/Extender: Frann Rider in Treatment: 10 Vital Signs Height(in): 65 Pulse(bpm): 90 Weight(lbs): 131 Blood Pressure 160/73 (mmHg): Body Mass Index(BMI): 22 Temperature(F): 97.6 Respiratory Rate 16 (breaths/min): Photos: [1:No Photos] [N/A:N/A] Wound Location: [1:Left Lower Leg - Anterior] [N/A:N/A] Wounding Event: [1:Trauma] [N/A:N/A] Primary Etiology: [1:Diabetic Wound/Ulcer of the Lower Extremity] [N/A:N/A] Secondary Etiology: [1:Trauma, Other] [N/A:N/A] Comorbid History: [1:Anemia, Hypertension, Type II Diabetes, Osteoarthritis] [N/A:N/A] Date Acquired: [1:05/02/2017] [N/A:N/A] Weeks of Treatment: [1:10] [N/A:N/A] Wound Status: [1:Open] [N/A:N/A] Measurements L x W x D 6.8x1.6x0.1 [N/A:N/A] (cm) Area (  cm) : [1:8.545] [N/A:N/A] Volume (cm) : [1:0.855] [N/A:N/A] % Reduction in Area: [1:-77.80%] [N/A:N/A] % Reduction in Volume: 11.00% [N/A:N/A] Classification: [1:Grade 1] [N/A:N/A] Exudate Amount: [1:Large] [N/A:N/A] Exudate Type: [1:Serous] [N/A:N/A] Exudate Color: [1:amber] [N/A:N/A] Wound Margin: [1:Flat and Intact] [N/A:N/A] Granulation Amount: [1:None Present (0%)] [N/A:N/A] Necrotic Amount: [1:Large (67-100%)] [N/A:N/A] Exposed Structures: [1:Fat Layer (Subcutaneous Tissue) Exposed: Yes Fascia: No Tendon: No Muscle: No] [N/A:N/A] Joint: No Bone: No Epithelialization: None N/A N/A Debridement: Debridement ([id number]- N/A N/A 11047) Pre-procedure 15:23 N/A N/A Verification/Time Out Taken: Pain Control: Lidocaine 4% Topical N/A N/A Solution Tissue Debrided: Fibrin/Slough, Exudates, N/A N/A Subcutaneous Level: Skin/Subcutaneous N/A N/A Tissue Debridement Area (sq 6 N/A N/A cm): Instrument: Curette N/A N/A Bleeding: Minimum N/A N/A Hemostasis Achieved:  Pressure N/A N/A Procedural Pain: 0 N/A N/A Post Procedural Pain: 0 N/A N/A Debridement Treatment Procedure was tolerated N/A N/A Response: well Post Debridement 6.8x1.6x0.1 N/A N/A Measurements L x W x D (cm) Post Debridement 0.855 N/A N/A Volume: (cm) Periwound Skin Texture: Scarring: Yes N/A N/A Excoriation: No Induration: No Callus: No Crepitus: No Rash: No Periwound Skin Maceration: No N/A N/A Moisture: Dry/Scaly: No Periwound Skin Color: Erythema: Yes N/A N/A Atrophie Blanche: No Cyanosis: No Ecchymosis: No Hemosiderin Staining: No Mottled: No Pallor: No Rubor: No Erythema Location: Circumferential N/A N/A Temperature: No Abnormality N/A N/A Tenderness on Yes N/A N/A Palpation: Wound Preparation: Ulcer Cleansing: N/A N/A Rinsed/Irrigated with [place], Fonnie P. ([id number]) Saline Topical Anesthetic Applied: Other: lidocaine 4% Procedures Performed: Debridement N/A N/A Treatment Notes Electronic Signature(s) Signed: 08/09/2017 4:10:50 PM By: Christin Fudge MD, FACS Entered By: Christin Fudge on 08/09/2017 15:30:03 [name] ([id number]) -------------------------------------------------------------------------------- Multi-Disciplinary Care Plan Details Patient Name: Alexa Lowes P. Date of Service: 08/09/2017 3:15 PM Medical Record Number: [id number] Patient Account Number: [id number] Date of Birth/Sex: 07-Mar-1926 (83 y.o. Female) Treating RN: Carolyne Fiscal, Debi Primary Care Yvon Mccord: Lucianne Lei Other Clinician: Referring Bellarose Burtt: Lucianne Lei Treating Indy Prestwood/Extender: Frann Rider in Treatment: 10 Active Inactive ` Abuse / Safety / Falls / Self Care Management Nursing Diagnoses: Potential for falls Goals: Patient will remain injury free related to falls Date Initiated: 05/28/2017 Target Resolution Date: 07/23/2017 Goal Status: Active Interventions: Assess fall risk on admission and as needed Notes: ` Orientation to the  Wound Care Program Nursing Diagnoses: Knowledge deficit related to the wound healing center program Goals: Patient/caregiver will verbalize understanding of the [facility] Date Initiated: 05/28/2017 Target Resolution Date: 07/23/2017 Goal Status: Active Interventions: Provide education on orientation to the wound center Notes: ` Wound/Skin Impairment Nursing Diagnoses: Impaired tissue integrity Kisamore, Yamilex P. ([id number]) Goals: Ulcer/skin breakdown will have a volume reduction of 30% by week 4 Date Initiated: 05/28/2017 Target Resolution Date: 07/23/2017 Goal Status: Active Ulcer/skin breakdown will have a volume reduction of 50% by week 8 Date Initiated: 05/28/2017 Target Resolution Date: 07/23/2017 Goal Status: Active Ulcer/skin breakdown will have a volume reduction of 80% by week 12 Date Initiated: 05/28/2017 Target Resolution Date: 07/23/2017 Goal Status: Active Ulcer/skin breakdown will heal within 14 weeks Date Initiated: 05/28/2017 Target Resolution Date: 07/23/2017 Goal Status: Active Interventions: Assess patient/caregiver ability to obtain necessary supplies Assess patient/caregiver ability to perform ulcer/skin care regimen upon admission and as needed Assess ulceration(s) every visit Notes: Electronic Signature(s) Signed: 08/09/2017 4:52:43 PM By: Alric Quan Entered By: Alric Quan on 08/09/2017 15:20:11 Dice, XFGHWEXH P. ([id number]) -------------------------------------------------------------------------------- Pain Assessment Details Patient Name: Alexa Lowes P. Date of Service: 08/09/2017 3:15 PM Medical Record Number: [id number] Patient Account Number: [id number] Date of  Birth/Sex: [birth year] (83 y.o. Female) Treating RN: Carolyne Fiscal, Debi Primary Care Jordanne Elsbury: Lucianne Lei Other Clinician: Referring Cyera Balboni: Lucianne Lei Treating Adria Costley/Extender: Frann Rider in Treatment: 10 Active Problems Location of  Pain Severity and Description of Pain Patient Has Paino No Site Locations Pain Management and Medication Current Pain Management: Electronic Signature(s) Signed: 08/09/2017 4:52:43 PM By: Alric Quan Entered By: Alric Quan on 08/09/2017 15:08:36 Babic, [name] ([id number]) -------------------------------------------------------------------------------- Patient/Caregiver Education Details Patient Name: Alexa Lowes P. Date of Service: 08/09/2017 3:15 PM Medical Record Number: [id number] Patient Account Number: [id number] Date of Birth/Gender: [birth year] (83 y.o. Female) Treating RN: Carolyne Fiscal, Debi Primary Care Physician: Lucianne Lei Other Clinician: Referring Physician: Lucianne Lei Treating Physician/Extender: Frann Rider in Treatment: 10 Education Assessment Education Provided To: Patient Education Topics Provided Wound/Skin Impairment: Handouts: Other: change dressing as ordered Methods: Demonstration, Explain/Verbal Responses: State content correctly Electronic Signature(s) Signed: 08/09/2017 4:52:43 PM By: Alric Quan Entered By: Alric Quan on 08/09/2017 15:21:33 Wehmeyer, ELFYBOFB P. ([id number]) -------------------------------------------------------------------------------- Wound Assessment Details Patient Name: Alexa Lowes P. Date of Service: 08/09/2017 3:15 PM Medical Record Number: [id number] Patient Account Number: [id number] Date of Birth/Sex: 1926-11-21 (83 y.o. Female) Treating RN: Carolyne Fiscal, Debi Primary Care Elward Nocera: Lucianne Lei Other Clinician: Referring Rian Busche: Lucianne Lei Treating Wandy Bossler/Extender: Frann Rider in Treatment: 10 Wound Status Wound Number: 1 Primary Diabetic Wound/Ulcer of the Lower Etiology: Extremity Wound Location: Left Lower Leg - Anterior Secondary Trauma, Other Wounding Event: Trauma Etiology: Date Acquired: 05/02/2017 Wound Status: Open Weeks Of Treatment: 10 Comorbid  Anemia, Hypertension, Type II Clustered Wound: No History: Diabetes, Osteoarthritis Photos Photo Uploaded By: Alric Quan on 08/09/2017 16:06:12 Wound Measurements Length: (cm) 6.8 Width: (cm) 1.6 Depth: (cm) 0.1 Area: (cm) 8.545 Volume: (cm) 0.855 % Reduction in Area: -77.8% % Reduction in Volume: 11% Epithelialization: None Tunneling: No Undermining: No Wound Description Classification: Grade 1 Wound Margin: Flat and Intact Exudate Amount: Large Exudate Type: Serous Exudate Color: amber Foul Odor After Cleansing: No Slough/Fibrino Yes Wound Bed Granulation Amount: None Present (0%) Exposed Structure Necrotic Amount: Large (67-100%) Fascia Exposed: No Necrotic Quality: Adherent Slough Fat Layer (Subcutaneous Tissue) Exposed: Yes Tendon Exposed: No Arico, Jaid P. ([id number]) Muscle Exposed: No Joint Exposed: No Bone Exposed: No Periwound Skin Texture Texture Color No Abnormalities Noted: No No Abnormalities Noted: No Callus: No Atrophie Blanche: No Crepitus: No Cyanosis: No Excoriation: No Ecchymosis: No Induration: No Erythema: Yes Rash: No Erythema Location: Circumferential Scarring: Yes Hemosiderin Staining: No Mottled: No Moisture Pallor: No No Abnormalities Noted: No Rubor: No Dry / Scaly: No Maceration: No Temperature / Pain Temperature: No Abnormality Tenderness on Palpation: Yes Wound Preparation Ulcer Cleansing: Rinsed/Irrigated with Saline Topical Anesthetic Applied: Other: lidocaine 4%, Treatment Notes Wound #1 (Left, Anterior Lower Leg) 1. Cleansed with: Clean wound with Normal Saline 2. Anesthetic Topical Lidocaine 4% cream to wound bed prior to debridement 4. Dressing Applied: Santyl Ointment 5. Secondary Dressing Applied Dry Gauze Kerlix/Conform 7. Secured with Tape Notes netting Electronic Signature(s) Signed: 08/09/2017 4:52:43 PM By: Alric Quan Entered By: Alric Quan on 08/09/2017  15:15:18 Hosie, [name] ([id number]) -------------------------------------------------------------------------------- Vitals Details Patient Name: Alexa Lowes P. Date of Service: 08/09/2017 3:15 PM Medical Record Number: [id number] Patient Account Number: [id number] Date of Birth/Sex: 18-Jul-1926 (83 y.o. Female) Treating RN: Carolyne Fiscal, Debi Primary Care Antwanette Wesche: Lucianne Lei Other Clinician: Referring Lillian Ballester: Lucianne Lei Treating Syra Sirmons/Extender: Frann Rider in Treatment: 10 Vital Signs Time Taken: 15:09 Temperature (F): 97.6 Height (in): 65 Pulse (bpm): 90 Weight (lbs): 131 Respiratory Rate (breaths/min):  16 Body Mass Index (BMI): 21.8 Blood Pressure (mmHg): 160/73 Reference Range: 80 - 120 mg / dl Electronic Signature(s) Signed: 08/09/2017 4:52:43 PM By: Alric Quan Entered By: Alric Quan on 08/09/2017 15:10:06

## 2017-08-10 NOTE — Progress Notes (Signed)
ZOOEY, SCHREURS ([id number]) Visit Report for 08/09/2017 Chief Complaint Document Details Patient Name: [name], [name] P. Date of Service: 08/09/2017 3:15 PM Medical Record Number: [id number] Patient Account Number: [id number] Date of Birth/Sex: [birth year] (83 y.o. Female) Treating RN: Carolyne Fiscal, Debi Primary Care Provider: Lucianne Lei Other Clinician: Referring Provider: Lucianne Lei Treating Provider/Extender: Frann Rider in Treatment: 10 Information Obtained from: Patient Chief Complaint Patients presents for treatment of an open diabetic ulcer 2 the left lower anterior leg which she sustained an injury by blunt trauma Electronic Signature(s) Signed: 08/09/2017 4:10:50 PM By: Christin Fudge MD, FACS Entered By: Christin Fudge on 08/09/2017 15:30:17 Ghrist, QIWLNLGX Q. ([id number]) -------------------------------------------------------------------------------- Debridement Details Patient Name: Alexa Lowes P. Date of Service: 08/09/2017 3:15 PM Medical Record Number: [id number] Patient Account Number: [id number] Date of Birth/Sex: 20-Sep-1926 (83 y.o. Female) Treating RN: Carolyne Fiscal, Debi Primary Care Provider: Lucianne Lei Other Clinician: Referring Provider: Lucianne Lei Treating Provider/Extender: Frann Rider in Treatment: 10 Debridement Performed for Wound #1 Left,Anterior Lower Leg Assessment: Performed By: Physician Christin Fudge, MD Debridement: Debridement Severity of Tissue Pre Fat layer exposed Debridement: Pre-procedure Verification/Time Out Yes - 15:23 Taken: Start Time: 15:24 Pain Control: Lidocaine 4% Topical Solution Level: Skin/Subcutaneous Tissue Total Area Debrided (L x 2 (cm) x 3 (cm) = 6 (cm) W): Tissue and other Viable, Non-Viable, Exudate, Fibrin/Slough, Subcutaneous material debrided: Instrument: Curette Bleeding: Minimum Hemostasis Achieved: Pressure End Time: 15:26 Procedural Pain: 0 Post Procedural Pain:  0 Response to Treatment: Procedure was tolerated well Post Debridement Measurements of Total Wound Length: (cm) 6.8 Width: (cm) 1.6 Depth: (cm) 0.1 Volume: (cm) 0.855 Character of Wound/Ulcer Post Requires Further Debridement Debridement: Severity of Tissue Post Debridement: Fat layer exposed Post Procedure Diagnosis Same as Pre-procedure Electronic Signature(s) Signed: 08/09/2017 4:10:50 PM By: Christin Fudge MD, FACS Signed: 08/09/2017 4:52:43 PM By: Gerome Apley, [name] ([id number]) Entered By: Christin Fudge on 08/09/2017 15:30:10 Pedraza, CHYIFOYD X. ([id number]) -------------------------------------------------------------------------------- HPI Details Patient Name: Alexa Lowes P. Date of Service: 08/09/2017 3:15 PM Medical Record Number: [id number] Patient Account Number: [id number] Date of Birth/Sex: [birth year] (83 y.o. Female) Treating RN: Carolyne Fiscal, Debi Primary Care Provider: Lucianne Lei Other Clinician: Referring Provider: Lucianne Lei Treating Provider/Extender: Frann Rider in Treatment: 10 History of Present Illness Location: left anterior shin Quality: Patient reports experiencing a dull pain to affected area(s). Severity: Patient states wound are getting worse. Duration: Patient has had the wound for < 3 weeks prior to presenting for treatment Timing: Pain in wound is Intermittent (comes and goes Context: The wound occurred when the patient blunt trauma against her left shin Modifying Factors: Other treatment(s) tried include:active Bactroban ointment locally Associated Signs and Symptoms: Patient reports having: occasional discharge from the wound HPI Description: 82 year old patient was seen in the ED about 10 days ago for a history of abrasion to the left lower extremity while she was boarding a bus and scraped the anterior part of her left leg. She has been a diabetic for many years and has been taking treatment regularly. Her  past medical history is also significant for anemia arthritis constipation and hypertension and is status post abdominal hysterectomy. She has never been a smoker. after her ER visit she was advised to apply Bactroban ointment to the wound twice a day and continue to monitor her blood glucose levels. her last hemoglobin A1c was done 3 years ago and was 6.6% 06/04/17 patient left anterior shin wound appears to be doing well although it is still somewhat dry despite the treatment  with Medihoney. We have been using Kerlex following the Medihoney application and I believe this is just not retaining that much moisture. Nonetheless there is no evidence of infection. 06/11/17 on evaluation today patient appears to be doing a little bit worse in regard to her wound. The entirety of the wound is dry and unfortunately the Medihoney does not seem to be helping this. That is even with the dressing changes that I made last week to try to retain more moisture. She has not tried [employer] as of yet. She was also noncompressible when testing for ABIs. 07/01/2017 -- she had a arterial studies done and was seen by Dr. Lucky Cowboy. her noninvasive studies showed noncompressible vessels on the right but brisk waveforms and digital pressures of 71 on the right consistent with only mild arterial insufficienc. On the left her ABI was 0.47 and this may be falsely elevated due to calcification. No digital pressures were obtained on the left and this is consistent with severe arterial insufficiency. this critical limb threatening situation on the left, would make wound healing difficult and it represents a serious situation and he has recommended an angiography with possible revascularization. The patient desired to defer the decision to she discuss with the family members. 07/09/17 on evaluation today patient appears to be doing fairly well in regard to her left anterior lower extremity wound. She has been tolerating the dressing  changes without complication we have been utilizing Santyl at this time. She tells me that she is having no significant pain compared to what she has had in the past. She has decided after discussing with family that she is going to go forward with the surgery with Dr. dew. This is to restore better blood flow to left lower extremity. With that it does appear that her lower extremity wound is making some progress albeit slow. No fevers, chills, nausea, or vomiting noted at this [facility] [place], [name] ([id number]) time. 07/16/17 on evaluation today patient's wound appears to be doing roughly about the same although some of the slough is clearing off. [name] she has her appointment with vascular next week on Thursday the 23rd. With that being said her wound does not appear to be hurting as badly as it has in the past which is good news. No fevers, chills, nausea, or vomiting noted at this time. 07/30/2017 -- she had surgery on 07/22/2017 --indications being nonhealing ulcer on the left leg with a noninvasive study showing marked reduction in ABI of less than 0.5 and no digital pressure on her left leg. she had a percutaneous transluminal angioplasty of the left common and external iliac arteries. She then had a stent placed to the left external iliac arteries and to the left common iliac artery. 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 The flow distally was poor and there were multiple areas of high-grade stenosis or occlusion of the distal SFA and the popliteal artery with only a diseased peroneal artery as runoff distally. She has a postop appointment on September 23 Electronic Signature(s) Signed: 08/09/2017 4:10:50 PM By: Christin Fudge MD, FACS Entered By: Christin Fudge on 08/09/2017 15:30:22 [name]  ([id number]) -------------------------------------------------------------------------------- Physical Exam Details Patient Name: Alexa Lowes P. Date of Service: 08/09/2017 3:15 PM Medical Record Number: [id number] Patient Account Number: [id number] Date of Birth/Sex: 1926/03/13 (83 y.o. Female) Treating RN: Carolyne Fiscal, Debi Primary Care Provider: Lucianne Lei Other Clinician: Referring Provider: Lucianne Lei Treating  Provider/Extender: Christin Fudge Weeks in Treatment: 10 Constitutional . Pulse regular. Respirations normal and unlabored. Afebrile. . Eyes Nonicteric. Reactive to light. Ears, Nose, Mouth, and Throat Lips, teeth, and gums WNL.Marland Kitchen Moist mucosa without lesions. Neck supple and nontender. No palpable supraclavicular or cervical adenopathy. Normal sized without goiter. Respiratory WNL. No retractions.. Cardiovascular Pedal Pulses WNL. No clubbing, cyanosis or edema. Lymphatic No adneopathy. No adenopathy. No adenopathy. Musculoskeletal Adexa without tenderness or enlargement.. Digits and nails w/o clubbing, cyanosis, infection, petechiae, ischemia, or inflammatory conditions.. Integumentary (Hair, Skin) No suspicious lesions. No crepitus or fluctuance. No peri-wound warmth or erythema. No masses.Marland Kitchen Psychiatric Judgement and insight Intact.. No evidence of depression, anxiety, or agitation.. Notes sharp debridement was done as the wound had a lot of subcutaneous debris and after removing this minimal bleeding was controlled with pressure. She will continue to need Santyl ointment locally Electronic Signature(s) Signed: 08/09/2017 4:10:50 PM By: Christin Fudge MD, FACS Entered By: Christin Fudge on 08/09/2017 15:30:51 [name] ([id number]) -------------------------------------------------------------------------------- Physician Orders Details Patient Name: Alexa Lowes P. Date of Service: 08/09/2017 3:15 PM Medical Record Number: [id number] Patient  Account Number: [id number] Date of Birth/Sex: [birth year] (83 y.o. Female) Treating RN: Carolyne Fiscal, Debi Primary Care Provider: Lucianne Lei Other Clinician: Referring Provider: Lucianne Lei Treating Provider/Extender: Frann Rider in Treatment: 10 Verbal / Phone Orders: Yes ClinicianCarolyne Fiscal, Debi Read Back and Verified: Yes Diagnosis Coding Wound Cleansing Wound #1 Left,Anterior Lower Leg o Clean wound with Normal Saline. o May Shower, gently pat wound dry prior to applying new dressing. Anesthetic Wound #1 Left,Anterior Lower Leg o Topical Lidocaine 4% cream applied to wound bed prior to debridement Skin Barriers/Peri-Wound Care Wound #1 Left,Anterior Lower Leg o Skin Prep Primary Wound Dressing Wound #1 Left,Anterior Lower Leg o Santyl Ointment Secondary Dressing Wound #1 Left,Anterior Lower Leg o Dry Gauze o Boardered Foam Dressing o Other - stretch netting #4 Dressing Change Frequency Wound #1 Left,Anterior Lower Leg o Change dressing every day. Follow-up Appointments Wound #1 Left,Anterior Lower Leg o Return Appointment in 1 week. Edema Control Wound #1 Left,Anterior Lower Leg o Elevate legs to the level of the heart and pump ankles as often as possible Mario, Sherryn P. ([id number]) Additional Orders / Instructions Wound #1 Left,Anterior Lower Leg o Increase protein intake. o Other: - Please add vitamin A, vitamin C and zinc supplements to your diet Electronic Signature(s) Signed: 08/09/2017 4:10:50 PM By: Christin Fudge MD, FACS Signed: 08/09/2017 4:52:43 PM By: Alric Quan Entered By: Alric Quan on 08/09/2017 15:26:37 [name], [name] ([id number]) -------------------------------------------------------------------------------- Problem List Details Patient Name: Alexa Lowes P. Date of Service: 08/09/2017 3:15 PM Medical Record Number: [id number] Patient Account Number: [id number] Date of Birth/Sex: 09-11-1926  (83 y.o. 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 ([id number]) -------------------------------------------------------------------------------- Progress Note Details Patient Name: Alexa Lowes P. Date of Service: 08/09/2017 3:15 PM Medical Record Number: [id number] Patient Account Number: [id number] Date of Birth/Sex: [birth year] (83 y.o. Female) Treating RN: Ahmed Prima Primary Care Provider: Criss Rosales,  VEITA Other Clinician: Referring Provider: Lucianne Lei Treating Provider/Extender: Frann Rider in Treatment: 10 Subjective Chief Complaint Information obtained from Patient Patients presents for treatment of an open diabetic ulcer 2 the left lower anterior leg which she sustained an injury by blunt trauma History of Present Illness (HPI) The following HPI elements were documented for the patient's wound: Location: left anterior shin Quality: Patient reports experiencing a dull pain to affected area(s). Severity: Patient states wound are getting worse. Duration: Patient has had the wound for < 3 weeks prior to presenting for treatment Timing: Pain in wound is Intermittent (comes and goes Context: The wound occurred when the patient blunt trauma against her left shin Modifying Factors: Other treatment(s) tried include:active  Bactroban ointment locally Associated Signs and Symptoms: Patient reports having: occasional discharge from the wound 83 year old patient was seen in the ED about 10 days ago for a history of abrasion to the left lower extremity while she was boarding a bus and scraped the anterior part of her left leg. She has been a diabetic for many years and has been taking treatment regularly. Her past medical history is also significant for anemia arthritis constipation and hypertension and is status post abdominal hysterectomy. She has never been a smoker. after her ER visit she was advised to apply Bactroban ointment to the wound twice a day and continue to monitor her blood glucose levels. her last hemoglobin A1c was done 3 years ago and was 6.6% 06/04/17 patient left anterior shin wound appears to be doing well although it is still somewhat dry despite the treatment with Medihoney. We have been using Kerlex following the Medihoney application and I believe this is just not retaining that much moisture. Nonetheless there is no evidence of infection. 06/11/17 on evaluation today patient appears to be doing a little bit worse in regard to her wound. The entirety of the wound is dry and unfortunately the Medihoney does not seem to be helping this. That is even with the dressing changes that I made last week to try to retain more moisture. She has not tried [employer] as of yet. She was also noncompressible when testing for ABIs. 07/01/2017 -- she had a arterial studies done and was seen by Dr. Lucky Cowboy. her noninvasive studies showed noncompressible vessels on the right but brisk waveforms and digital pressures of 71 on the right consistent with only mild arterial insufficienc. On the left her ABI was 0.47 and this may be falsely elevated due to calcification. No digital pressures were obtained on the left and this is consistent with severe arterial insufficiency. this critical limb threatening situation on the left,  would make wound healing difficult and it [name], [name] ([id number]) represents a serious situation and he has recommended an angiography with possible revascularization. The patient desired to defer the decision to she discuss with the family members. 07/09/17 on evaluation today patient appears to be doing fairly well in regard to her left anterior lower extremity wound. She has been tolerating the dressing changes without complication we have been utilizing Santyl at this time. She tells me that she is having no significant pain compared to what she has had in the past. She has decided after discussing with family that she is going to go forward with the surgery with Dr. dew. This is to restore better blood flow to left lower extremity. With that it does appear that her lower extremity wound is making some progress albeit slow. No fevers, chills, nausea, or vomiting noted  at this time. 07/16/17 on evaluation today patient's wound appears to be doing roughly about the same although some of the slough is clearing off. [name] she has her appointment with vascular next week on Thursday the 23rd. With that being said her wound does not appear to be hurting as badly as it has in the past which is good news. No fevers, chills, nausea, or vomiting noted at this time. 07/30/2017 -- she had surgery on 07/22/2017 --indications being nonhealing ulcer on the left leg with a noninvasive study showing marked reduction in ABI of less than 0.5 and no digital pressure on her left leg. she had a percutaneous transluminal angioplasty of the left common and external iliac arteries. She then had a stent placed to the left external iliac arteries and to the left common iliac artery. 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 The flow distally was poor and there were multiple areas of high-grade stenosis or occlusion of the  distal SFA and the popliteal artery with only a diseased peroneal artery as runoff distally. She has a postop appointment on September 23 Objective Constitutional Pulse regular. Respirations normal and unlabored. Afebrile. Vitals Time Taken: 3:09 PM, Height: 65 in, Weight: 131 lbs, BMI: 21.8, Temperature: 97.6 F, Pulse: 90 bpm, Respiratory Rate: 16 breaths/min, Blood Pressure: 160/73 mmHg. Eyes Nonicteric. Reactive to light. Ears, Nose, Mouth, and Throat Lips, teeth, and gums WNL.Marland Kitchen Moist mucosa without lesions. [name], [name] ([id number]) Neck supple and nontender. No palpable supraclavicular or cervical adenopathy. Normal sized without goiter. Respiratory WNL. No retractions.. Cardiovascular Pedal Pulses WNL. No clubbing, cyanosis or edema. Lymphatic No adneopathy. No adenopathy. No adenopathy. Musculoskeletal Adexa without tenderness or enlargement.. Digits and nails w/o clubbing, cyanosis, infection, petechiae, ischemia, or inflammatory conditions.Marland Kitchen Psychiatric Judgement and insight Intact.. No evidence of depression, anxiety, or agitation.. General Notes: sharp debridement was done as the wound had a lot of subcutaneous debris and after removing this minimal bleeding was controlled with pressure. She will continue to need Santyl ointment locally Integumentary (Hair, Skin) No suspicious lesions. No crepitus or fluctuance. No peri-wound warmth or erythema. No masses.. Wound #1 status is Open. Original cause of wound was Trauma. The wound is located on the Left,Anterior Lower Leg. The wound measures 6.8cm length x 1.6cm width x 0.1cm depth; 8.545cm^2 area and 0.855cm^3 volume. There is Fat Layer (Subcutaneous Tissue) Exposed exposed. There is no tunneling or undermining noted. There is a large amount of serous drainage noted. The wound margin is flat and intact. There is no granulation within the wound bed. There is a large (67-100%) amount of necrotic tissue within the  wound bed including Adherent Slough. The periwound skin appearance exhibited: Scarring, Erythema. The periwound skin appearance did not exhibit: Callus, Crepitus, Excoriation, Induration, Rash, Dry/Scaly, Maceration, Atrophie Blanche, Cyanosis, Ecchymosis, Hemosiderin Staining, Mottled, Pallor, Rubor. The surrounding wound skin color is noted with erythema which is circumferential. Periwound temperature was noted as No Abnormality. The periwound has tenderness on palpation. 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 ([id number]) Procedures Wound #1 Pre-procedure diagnosis of Wound #1 is a Diabetic Wound/Ulcer of the Lower Extremity located on the Left,Anterior Lower Leg .Severity of Tissue Pre Debridement is: Fat layer exposed. There was  a Skin/Subcutaneous Tissue Debridement ([id number]) debridement with total area of 6 sq cm performed by Christin Fudge, MD. with the following instrument(s): Curette to remove Viable and Non-Viable tissue/material including Exudate, Fibrin/Slough, and Subcutaneous after achieving pain control using Lidocaine 4% Topical Solution. A time out was conducted at 15:23, prior to the start of the procedure. A Minimum amount of bleeding was controlled with Pressure. The procedure was tolerated well with a pain level of 0 throughout and a pain level of 0 following the procedure. Post Debridement Measurements: 6.8cm length x 1.6cm width x 0.1cm depth; 0.855cm^3 volume. Character of Wound/Ulcer Post Debridement requires further debridement. Severity of Tissue Post Debridement is: Fat layer exposed. Post procedure Diagnosis Wound #1: Same as Pre-Procedure Plan Wound Cleansing: Wound #1 Left,Anterior Lower Leg: Clean wound with Normal Saline. May  Shower, gently pat wound dry prior to applying new dressing. Anesthetic: Wound #1 Left,Anterior Lower Leg: Topical Lidocaine 4% cream applied to wound bed prior to debridement Skin Barriers/Peri-Wound Care: Wound #1 Left,Anterior Lower Leg: Skin Prep Primary Wound Dressing: Wound #1 Left,Anterior Lower Leg: Santyl Ointment Secondary Dressing: Wound #1 Left,Anterior Lower Leg: Dry Gauze Boardered Foam Dressing Other - stretch netting #4 Dressing Change Frequency: Wound #1 Left,Anterior Lower Leg: Change dressing every day. [name], [name] ([id number]) Follow-up Appointments: Wound #1 Left,Anterior Lower Leg: Return Appointment in 1 week. Edema Control: Wound #1 Left,Anterior Lower Leg: Elevate legs to the level of the heart and pump ankles as often as possible Additional Orders / Instructions: Wound #1 Left,Anterior Lower Leg: Increase protein intake. Other: - Please add vitamin A, vitamin C and zinc supplements to your diet After sharp debridement today, I have recommended: 1. Daily washing with soap and water and application of Santyl ointment locally with a light Kerlix dressing. 2. good control of her diabetes mellitus 3. Adequate protein, vitamin A, vitamin C and zinc 4. Regular visits the wound center Electronic Signature(s) Signed: 08/09/2017 4:10:50 PM By: Christin Fudge MD, FACS Entered By: Christin Fudge on 08/09/2017 15:31:57 [name], [name] ([id number]) -------------------------------------------------------------------------------- SuperBill Details Patient Name: Alexa Lowes P. Date of Service: 08/09/2017 Medical Record Number: [id number] Patient Account Number: [id number] Date of Birth/Sex: 14-Jun-1926 (83 y.o. 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 Facility Procedures CPT4 Code Description: [id number] 11042 - DEB SUBQ TISSUE 20 SQ CM/< ICD-10 Description Diagnosis E11.622 Type 2 diabetes mellitus with other skin ulcer L97.222 Non-pressure chronic ulcer of left calf with fat layer e H29.924 Atherosclerosis of native  arteries of left leg with ulce Modifier: xposed ration of ca Quantity: 1 lf Physician Procedures CPT4 Code Description: [id number] 62229 - WC PHYS SUBQ TISS 20 SQ CM ICD-10 Description Diagnosis E11.622 Type 2 diabetes mellitus with other skin ulcer L97.222 Non-pressure chronic ulcer of left calf with fat layer e N98.921 Atherosclerosis of native  arteries of left leg with ulce Modifier: xposed ration of ca Quantity: 1 lf Electronic Signature(s) Signed: 08/09/2017 4:10:50 PM By: Christin Fudge MD, FACS Entered By: Christin Fudge on 08/09/2017 15:32:07

## 2017-08-12 ENCOUNTER — Other Ambulatory Visit (INDEPENDENT_AMBULATORY_CARE_PROVIDER_SITE_OTHER): Payer: Self-pay | Admitting: Vascular Surgery

## 2017-08-12 ENCOUNTER — Encounter (INDEPENDENT_AMBULATORY_CARE_PROVIDER_SITE_OTHER): Payer: Self-pay | Admitting: Vascular Surgery

## 2017-08-12 ENCOUNTER — Encounter (INDEPENDENT_AMBULATORY_CARE_PROVIDER_SITE_OTHER): Payer: Self-pay

## 2017-08-12 ENCOUNTER — Ambulatory Visit (INDEPENDENT_AMBULATORY_CARE_PROVIDER_SITE_OTHER): Payer: Medicare Other

## 2017-08-12 ENCOUNTER — Ambulatory Visit (INDEPENDENT_AMBULATORY_CARE_PROVIDER_SITE_OTHER): Payer: Medicare Other | Admitting: Vascular Surgery

## 2017-08-12 VITALS — BP 141/72 | HR 87 | Resp 16 | Ht 65.0 in | Wt 125.0 lb

## 2017-08-12 DIAGNOSIS — I7025 Atherosclerosis of native arteries of other extremities with ulceration: Secondary | ICD-10-CM

## 2017-08-12 DIAGNOSIS — E119 Type 2 diabetes mellitus without complications: Secondary | ICD-10-CM

## 2017-08-12 DIAGNOSIS — E785 Hyperlipidemia, unspecified: Secondary | ICD-10-CM | POA: Diagnosis not present

## 2017-08-12 DIAGNOSIS — I739 Peripheral vascular disease, unspecified: Secondary | ICD-10-CM | POA: Diagnosis not present

## 2017-08-12 NOTE — Progress Notes (Signed)
Subjective:    Patient ID: [name], female    DOB: 1926-04-12, 83 y.o.   MRN: [id number] Chief Complaint  Patient presents with  . Routine Post Op    3 week post op   Patient presents for her first post procedure ABI. She is status post a left lower extremity angiogram on 07/22/17 (percutaneous transluminal angioplasty of left common and external iliac arteries with 2 inflations of a 5 mm diameter by 8 cm length Lutonix drug-coated angioplasty balloon, Lifestream stent placement to the left external iliac artery with 6 mm diameter by 37 mm length covered stent, Lifestream stent placement 2 to the left common iliac artery with both stents being 7 mm diameter by 26 mm length stents). She presents today with minimal improvement to the left shin ulceration and worsening discomfort which was present in her left foot before the procedure. The patient underwent a bilateral ABI which was notable for moderate right lower extremity arterial disease. Unable to obtain left ankle brachial indices due to absent Doppler velocities in the posterior tibial artery and anterior tibial artery. Monophasic waveforms of the left femoral and popliteal artery. Patient denies any fever, nausea or vomiting.    Review of Systems  Constitutional: Negative.   HENT: Negative.   Eyes: Negative.   Respiratory: Negative.   Cardiovascular: Positive for leg swelling.  Gastrointestinal: Negative.   Endocrine: Negative.   Genitourinary: Negative.   Musculoskeletal: Negative.   Skin: Positive for color change and wound.  Allergic/Immunologic: Negative.   Neurological: Negative.   Hematological: Negative.   Psychiatric/Behavioral: Negative.       Objective:   Physical Exam  Constitutional: She is oriented to person, place, and time. She appears well-developed and well-nourished. No distress.  HENT:  Head: Normocephalic and atraumatic.  Eyes: Pupils are equal, round, and reactive to light. Conjunctivae are  normal.  Neck: Normal range of motion.  Cardiovascular: Normal rate, regular rhythm, normal heart sounds and intact distal pulses.   Pulses:      Radial pulses are 2+ on the right side, and 2+ on the left side.       Dorsalis pedis pulses are 1+ on the right side.       Posterior tibial pulses are 1+ on the right side.  Unable to palpate left pedal pulses however the foot is warm.  Pulmonary/Chest: Effort normal.  Musculoskeletal: Normal range of motion. She exhibits edema (Mild left foot edema).  Neurological: She is alert and oriented to person, place, and time.  Skin: She is not diaphoretic.     Noninfected ulceration to the left shin.  Psychiatric: She has a normal mood and affect. Her behavior is normal. Judgment and thought content normal.  Vitals reviewed.   BP (!) 141/72 (BP Location: Right Arm)   Pulse 87   Resp 16   Ht 5\' 5"  (1.651 m)   Wt 125 lb (56.7 kg)   BMI 20.80 kg/m   Past Medical History:  Diagnosis Date  . Anemia    family unsure of this history  . Arthritis   . Chronic ulcer of calf (HCC)    Left  . Constipation   . Diabetes mellitus without complication ([place])   . Dyslipidemia   . GERD (gastroesophageal reflux disease)   . Hypertension     Social History   Social History  . Marital status: Legally Separated    Spouse name: N/A  . Number of children: N/A  . Years of education: N/A  Occupational History  . Not on file.   Social History Main Topics  . Smoking status: Never Smoker  . Smokeless tobacco: Never Used  . Alcohol use No  . Drug use: No  . Sexual activity: Not on file   Other Topics Concern  . Not on file   Social History Narrative  . No narrative on file    Past Surgical History:  Procedure Laterality Date  . ABDOMINAL HYSTERECTOMY    . EYE SURGERY Bilateral    Cataract Extraction with IOL  . LOWER EXTREMITY ANGIOGRAPHY Left 07/22/2017   Procedure: Lower Extremity Angiography;  Surgeon: Algernon Huxley, MD;  Location:  [facility];  Service: Cardiovascular;  Laterality: Left;  Marland Kitchen MULTIPLE TOOTH EXTRACTIONS      No family history on file.  No Known Allergies     Assessment & Plan:  Patient presents for her first post procedure ABI. She is status post a left lower extremity angiogram on 07/22/17 (percutaneous transluminal angioplasty of left common and external iliac arteries with 2 inflations of a 5 mm diameter by 8 cm length Lutonix drug-coated angioplasty balloon, Lifestream stent placement to the left external iliac artery with 6 mm diameter by 37 mm length covered stent, Lifestream stent placement 2 to the left common iliac artery with both stents being 7 mm diameter by 26 mm length stents). She presents today with minimal improvement to the left shin ulceration and worsening discomfort which was present in her left foot before the procedure. The patient underwent a bilateral ABI which was notable for moderate right lower extremity arterial disease. Unable to obtain left ankle brachial indices due to absent Doppler velocities in the posterior tibial artery and anterior tibial artery. Monophasic waveforms of the left femoral and popliteal artery. Patient denies any fever, nausea or vomiting.   1. Atherosclerosis of native arteries of the extremities with ulceration ([place]) - Stable Post procedure ABI worrisome for failure of stents placed. Patient with worsening left foot pain since procedure Minimal healing to left shin ulceration Recommend a left lower extremity angiogram and an effort to restore adequate blood flow to the left foot. Procedure, risks and benefits explained to the patient All questions answered Patient wishes to proceed  2. Diabetes mellitus without complication ([place]) - stable Encouraged good control as its slows the progression of atherosclerotic disease  3. Dyslipidemia - stable Encouraged good control as its slows the progression of atherosclerotic disease   Current  Outpatient Prescriptions on File Prior to Visit  Medication Sig Dispense Refill  . amLODipine (NORVASC) 10 MG tablet Take 10 mg by mouth daily.    Marland Kitchen atorvastatin (LIPITOR) 10 MG tablet Take 10 mg by mouth daily.    . Calcium-Magnesium-Zinc (CAL-MAG-ZINC PO) Take 1 tablet by mouth daily.    . carvedilol (COREG) 6.25 MG tablet Take 6.25 mg by mouth at bedtime.    . Cholecalciferol (VITAMIN D3 GUMMIES ADULT PO) Take 1 tablet by mouth daily.    . clopidogrel (PLAVIX) 75 MG tablet Take 1 tablet (75 mg total) by mouth daily with breakfast. 30 tablet 1  . metFORMIN (GLUCOPHAGE) 1000 MG tablet Take 1,000 mg by mouth 2 (two) times daily with a meal.    . pantoprazole (PROTONIX) 20 MG tablet Take 1 tablet (20 mg total) by mouth daily. 30 tablet 1  . polyethylene glycol (MIRALAX / GLYCOLAX) packet Take 17 g by mouth daily as needed (for constipation.).     Marland Kitchen SANTYL ointment Apply 1 application  topically daily. Applied to wound of leg and dress wound  0  . valsartan-hydrochlorothiazide (DIOVAN-HCT) 320-25 MG per tablet Take 1 tablet by mouth daily.     No current facility-administered medications on file prior to visit.     There are no Patient Instructions on file for this visit. No Follow-up on file.   Kaci Dillie A Zyanna Leisinger, PA-C

## 2017-08-13 ENCOUNTER — Encounter
Admission: RE | Admit: 2017-08-13 | Discharge: 2017-08-13 | Disposition: A | Discharge status: Home / Self Care | Payer: Medicare Other | Source: Ambulatory Visit | Attending: Vascular Surgery | Admitting: Vascular Surgery

## 2017-08-13 DIAGNOSIS — Z9841 Cataract extraction status, right eye: Secondary | ICD-10-CM | POA: Diagnosis not present

## 2017-08-13 DIAGNOSIS — Z7902 Long term (current) use of antithrombotics/antiplatelets: Secondary | ICD-10-CM | POA: Diagnosis not present

## 2017-08-13 DIAGNOSIS — Z9842 Cataract extraction status, left eye: Secondary | ICD-10-CM | POA: Diagnosis not present

## 2017-08-13 DIAGNOSIS — I70248 Atherosclerosis of native arteries of left leg with ulceration of other part of lower left leg: Secondary | ICD-10-CM | POA: Diagnosis not present

## 2017-08-13 DIAGNOSIS — Z955 Presence of coronary angioplasty implant and graft: Secondary | ICD-10-CM | POA: Diagnosis not present

## 2017-08-13 DIAGNOSIS — I10 Essential (primary) hypertension: Secondary | ICD-10-CM | POA: Diagnosis not present

## 2017-08-13 DIAGNOSIS — Z9889 Other specified postprocedural states: Secondary | ICD-10-CM | POA: Diagnosis not present

## 2017-08-13 DIAGNOSIS — E785 Hyperlipidemia, unspecified: Secondary | ICD-10-CM | POA: Diagnosis not present

## 2017-08-13 DIAGNOSIS — E11621 Type 2 diabetes mellitus with foot ulcer: Secondary | ICD-10-CM | POA: Diagnosis not present

## 2017-08-13 DIAGNOSIS — Z7984 Long term (current) use of oral hypoglycemic drugs: Secondary | ICD-10-CM | POA: Diagnosis not present

## 2017-08-13 DIAGNOSIS — K219 Gastro-esophageal reflux disease without esophagitis: Secondary | ICD-10-CM | POA: Diagnosis not present

## 2017-08-13 DIAGNOSIS — R600 Localized edema: Secondary | ICD-10-CM | POA: Diagnosis not present

## 2017-08-13 DIAGNOSIS — Z90710 Acquired absence of both cervix and uterus: Secondary | ICD-10-CM | POA: Diagnosis not present

## 2017-08-13 DIAGNOSIS — M1990 Unspecified osteoarthritis, unspecified site: Secondary | ICD-10-CM | POA: Diagnosis not present

## 2017-08-13 DIAGNOSIS — L97829 Non-pressure chronic ulcer of other part of left lower leg with unspecified severity: Secondary | ICD-10-CM | POA: Diagnosis not present

## 2017-08-13 LAB — BUN: BUN: 22 mg/dL — ABNORMAL HIGH (ref 6–20)

## 2017-08-13 LAB — CREATININE, SERUM
Creatinine, Ser: 1.29 mg/dL — ABNORMAL HIGH (ref 0.44–1.00)
GFR calc Af Amer: 41 mL/min — ABNORMAL LOW
GFR calc non Af Amer: 36 mL/min — ABNORMAL LOW

## 2017-08-15 MED ORDER — CEFAZOLIN SODIUM-DEXTROSE 2-4 GM/100ML-% IV SOLN
2.0000 g | Freq: Once | INTRAVENOUS | Status: DC
  Administered 2017-08-16: 2 g via INTRAVENOUS

## 2017-08-16 ENCOUNTER — Ambulatory Visit
Admission: RE | Admit: 2017-08-16 | Discharge: 2017-08-16 | Disposition: A | Discharge status: Home / Self Care | Payer: Medicare Other | Source: Ambulatory Visit | Attending: Vascular Surgery | Admitting: Vascular Surgery

## 2017-08-16 ENCOUNTER — Encounter
Admission: RE | Disposition: A | Discharge status: Home / Self Care | Payer: Self-pay | Source: Ambulatory Visit | Attending: Vascular Surgery

## 2017-08-16 ENCOUNTER — Encounter: Payer: Self-pay | Admitting: *Deleted

## 2017-08-16 DIAGNOSIS — Z955 Presence of coronary angioplasty implant and graft: Secondary | ICD-10-CM | POA: Insufficient documentation

## 2017-08-16 DIAGNOSIS — L97829 Non-pressure chronic ulcer of other part of left lower leg with unspecified severity: Secondary | ICD-10-CM | POA: Insufficient documentation

## 2017-08-16 DIAGNOSIS — I10 Essential (primary) hypertension: Secondary | ICD-10-CM | POA: Insufficient documentation

## 2017-08-16 DIAGNOSIS — R600 Localized edema: Secondary | ICD-10-CM | POA: Insufficient documentation

## 2017-08-16 DIAGNOSIS — E11621 Type 2 diabetes mellitus with foot ulcer: Secondary | ICD-10-CM | POA: Insufficient documentation

## 2017-08-16 DIAGNOSIS — Z9889 Other specified postprocedural states: Secondary | ICD-10-CM | POA: Insufficient documentation

## 2017-08-16 DIAGNOSIS — K219 Gastro-esophageal reflux disease without esophagitis: Secondary | ICD-10-CM | POA: Insufficient documentation

## 2017-08-16 DIAGNOSIS — Z90710 Acquired absence of both cervix and uterus: Secondary | ICD-10-CM | POA: Insufficient documentation

## 2017-08-16 DIAGNOSIS — Z7902 Long term (current) use of antithrombotics/antiplatelets: Secondary | ICD-10-CM | POA: Insufficient documentation

## 2017-08-16 DIAGNOSIS — E785 Hyperlipidemia, unspecified: Secondary | ICD-10-CM | POA: Insufficient documentation

## 2017-08-16 DIAGNOSIS — Z9842 Cataract extraction status, left eye: Secondary | ICD-10-CM | POA: Insufficient documentation

## 2017-08-16 DIAGNOSIS — I70248 Atherosclerosis of native arteries of left leg with ulceration of other part of lower left leg: Secondary | ICD-10-CM | POA: Diagnosis not present

## 2017-08-16 DIAGNOSIS — M1990 Unspecified osteoarthritis, unspecified site: Secondary | ICD-10-CM | POA: Insufficient documentation

## 2017-08-16 DIAGNOSIS — Z7984 Long term (current) use of oral hypoglycemic drugs: Secondary | ICD-10-CM | POA: Insufficient documentation

## 2017-08-16 DIAGNOSIS — Z9841 Cataract extraction status, right eye: Secondary | ICD-10-CM | POA: Insufficient documentation

## 2017-08-16 HISTORY — PX: LOWER EXTREMITY ANGIOGRAPHY: CATH118251

## 2017-08-16 LAB — GLUCOSE, CAPILLARY: Glucose-Capillary: 143 mg/dL — ABNORMAL HIGH (ref 65–99)

## 2017-08-16 SURGERY — LOWER EXTREMITY ANGIOGRAPHY
Anesthesia: Moderate Sedation | Laterality: Left

## 2017-08-16 MED ORDER — SODIUM CHLORIDE 0.9 % IV SOLN: INTRAVENOUS | Status: DC

## 2017-08-16 MED ORDER — HYDRALAZINE HCL 20 MG/ML IJ SOLN: 5.0000 mg | INTRAMUSCULAR | Status: DC | PRN

## 2017-08-16 MED ORDER — HEPARIN SODIUM (PORCINE) 1000 UNIT/ML IJ SOLN
INTRAMUSCULAR | Status: DC | PRN
  Administered 2017-08-16: 4000 [IU] via INTRAVENOUS

## 2017-08-16 MED ORDER — FAMOTIDINE 20 MG PO TABS: 40.0000 mg | ORAL_TABLET | ORAL | Status: DC | PRN

## 2017-08-16 MED ORDER — FENTANYL CITRATE (PF) 100 MCG/2ML IJ SOLN
INTRAMUSCULAR | Status: DC | PRN
  Administered 2017-08-16: 25 ug via INTRAVENOUS

## 2017-08-16 MED ORDER — MIDAZOLAM HCL 2 MG/2ML IJ SOLN
INTRAMUSCULAR | Status: DC | PRN
  Administered 2017-08-16: 1 mg via INTRAVENOUS

## 2017-08-16 MED ORDER — IOPAMIDOL (ISOVUE-300) INJECTION 61%
INTRAVENOUS | Status: DC | PRN
  Administered 2017-08-16: 50 mL via INTRA_ARTERIAL

## 2017-08-16 MED ORDER — SODIUM CHLORIDE 0.9 % IV BOLUS (SEPSIS): 250.0000 mL | Freq: Once | INTRAVENOUS | Status: DC

## 2017-08-16 MED ORDER — SODIUM CHLORIDE 0.9 % IV SOLN: 250.0000 mL | INTRAVENOUS | Status: DC | PRN

## 2017-08-16 MED ORDER — HEPARIN (PORCINE) IN NACL 2-0.9 UNIT/ML-% IJ SOLN
INTRAMUSCULAR | Status: AC
  Filled 2017-08-16: qty 1000

## 2017-08-16 MED ORDER — MIDAZOLAM HCL 5 MG/5ML IJ SOLN
INTRAMUSCULAR | Status: AC
  Filled 2017-08-16: qty 5

## 2017-08-16 MED ORDER — SODIUM CHLORIDE 0.9% FLUSH: 3.0000 mL | Freq: Two times a day (BID) | INTRAVENOUS | Status: DC

## 2017-08-16 MED ORDER — METHYLPREDNISOLONE SODIUM SUCC 125 MG IJ SOLR: 125.0000 mg | INTRAMUSCULAR | Status: DC | PRN

## 2017-08-16 MED ORDER — SODIUM CHLORIDE 0.9 % IV SOLN
INTRAVENOUS | Status: DC
  Administered 2017-08-16: 10:00:00 via INTRAVENOUS

## 2017-08-16 MED ORDER — LIDOCAINE-EPINEPHRINE (PF) 2 %-1:200000 IJ SOLN
INTRAMUSCULAR | Status: AC
  Filled 2017-08-16: qty 20

## 2017-08-16 MED ORDER — HEPARIN SODIUM (PORCINE) 1000 UNIT/ML IJ SOLN
INTRAMUSCULAR | Status: AC
  Filled 2017-08-16: qty 1

## 2017-08-16 MED ORDER — SODIUM CHLORIDE 0.9% FLUSH: 3.0000 mL | INTRAVENOUS | Status: DC | PRN

## 2017-08-16 MED ORDER — FENTANYL CITRATE (PF) 100 MCG/2ML IJ SOLN
INTRAMUSCULAR | Status: AC
  Filled 2017-08-16: qty 2

## 2017-08-16 MED ORDER — LABETALOL HCL 5 MG/ML IV SOLN: 10.0000 mg | INTRAVENOUS | Status: DC | PRN

## 2017-08-16 SURGICAL SUPPLY — 17 items
BALLN DORADO 9X40X80 (BALLOONS) ×3
BALLOON DORADO 9X40X80 (BALLOONS) ×1 IMPLANT
CATH BEACON 5 .035 65 RIM TIP (CATHETERS) ×3 IMPLANT
CATH PIG 70CM (CATHETERS) ×2 IMPLANT
COVER PROBE U/S 5X48 (MISCELLANEOUS) ×2 IMPLANT
DEVICE PRESTO INFLATION (MISCELLANEOUS) ×2 IMPLANT
DEVICE STARCLOSE SE CLOSURE (Vascular Products) ×2 IMPLANT
DEVICE TORQUE .025-.038 (MISCELLANEOUS) ×2 IMPLANT
GLIDEWIRE STIFF .35X180X3 HYDR (WIRE) ×2 IMPLANT
PACK ANGIOGRAPHY (CUSTOM PROCEDURE TRAY) ×3 IMPLANT
SHEATH BRITE TIP 5FRX11 (SHEATH) ×2 IMPLANT
SHEATH PINNACLE MP 7F 45CM (SHEATH) ×2 IMPLANT
STENT LIFESTREAM 9X38X80 (Permanent Stent) ×2 IMPLANT
SYR MEDRAD MARK V 150ML (SYRINGE) ×2 IMPLANT
TUBING CONTRAST HIGH PRESS 72 (TUBING) ×2 IMPLANT
WIRE J 3MM .035X145CM (WIRE) ×3 IMPLANT
WIRE MAGIC TOR.035 180C (WIRE) ×2 IMPLANT

## 2017-08-16 NOTE — H&P (Signed)
 [facility] History & Physical Update  The patient was interviewed and re-examined.  The patient's previous History and Physical has been reviewed and is unchanged.  There is no change in the plan of care. We plan to proceed with the scheduled procedure.  Leotis Pain, MD  08/16/2017, 10:28 AM

## 2017-08-16 NOTE — Progress Notes (Signed)
Dr. Lucky Cowboy at bedside; spoke with pt. And family re: results and future plans. All verbalized understanding.

## 2017-08-16 NOTE — Op Note (Signed)
[facility] Percutaneous Study/Intervention Procedural Note   Date of Surgery: 08/16/2017  Surgeon(s):Cyanna Neace   Assistants:none  Pre-operative Diagnosis: PAD with ulceration left lower extremity  Post-operative diagnosis: Same  Procedure(s) Performed: 1. Ultrasound guidance for vascular access right femoral artery 2. Catheter placement into left common femoral artery from right femoral approach 3. Aortogram and selective left lower extremity angiogram 4. Lifestream stent placement to the left common iliac artery with 9 mm diameter by 36 mm length covered stent 5. StarClose closure device right femoral artery  EBL: 10 cc  Contrast: 50 cc  Fluoro Time: 5.9 minutes  Moderate Conscious Sedation Time: approximately 30 minutes using 1 mg of Versed and 25 mcg of Fentanyl  Indications: Patient is a 83 y.o.female with PAD and a nonhealing ulceration of the left lower extremity despite previous intervention to her iliac system. The patient is brought in for angiography for further evaluation and potential treatment. Risks and benefits are discussed and informed consent is obtained  Procedure: The patient was identified and appropriate procedural time out was performed. The patient was then placed supine on the table and prepped and draped in the usual sterile fashion.Moderate conscious sedation was administered during a face to face encounter with the patient throughout the procedure with my supervision of the RN administering medicines and monitoring the patient's vital signs, pulse oximetry, telemetry and mental status throughout from the start of the procedure until the patient was taken to the recovery room. Ultrasound was used to evaluate the right common femoral artery. It was patent . A digital ultrasound image was acquired. A Seldinger needle was used to access the right  common femoral artery under direct ultrasound guidance and a permanent image was performed. A 0.035 J wire was advanced without resistance and a 5Fr sheath was placed. Pigtail catheter was placed into the aorta and an AP aortogram was performed. This demonstrated renal arteries appeared patent. Aorta was patent. Right iliac system without significant stenosis. The stents placed in the left iliac system had some mild narrowing at the iliac bifurcation in the 30-40% range. At the leading edge of the stent proximally there was poor wall apposition with stenosis in the 50% to 60% range in the right common iliac artery. The left external iliac artery appeared patent. Occlusion of the left common femoral artery as listed below. I then crossed the aortic bifurcation and advanced to the left femoral head. Selective left lower extremity angiogram was then performed. This demonstrated occlusion of the common femoral artery and the origins of the SFA and profunda femoris artery. Flow distally was poor, but there appeared to be multiple areas of high-grade stenosis or occlusion in the distal SFA and popliteal artery with only a diseased peroneal artery as runoff distally. The patient was systemically heparinized and a 7 French sheath was then placed over the Magic torque wire. I elected to place a stent at the proximal edge of the previous stents to oppose the wall better and removed the moderate residual stenosis. A 9 mm diameter by 36 mm length Lifestream stent was deployed originating about 5 mm after the common iliac origin and going into the previously placed stents. This was post dilated with a 9 mm high pressure balloon inflated to 16 atm. Following this, there was less than 20% residual stenosis. The femoral occlusion would need to be treated surgically. I elected to terminate the procedure. The sheath was removed and StarClose closure device was deployed in the right femoral artery with  excellent hemostatic result.  The patient was taken to the recovery room in stable condition having tolerated the procedure well.  Findings:  Aortogram: renal arteries appeared patent. Aorta was patent. Right iliac system without significant stenosis. The stents placed in the left iliac system had some mild narrowing at the iliac bifurcation in the 30-40% range. At the leading edge of the stent proximally there was poor wall apposition with stenosis in the 50% to 60% range in the right common iliac artery. The left external iliac artery appeared patent. Occlusion of the left common femoral artery as listed below. Left Lower Extremity: occlusion of the common femoral artery and the origins of the SFA and profunda femoris artery. Flow distally was poor, but there appeared to be multiple areas of high-grade stenosis or occlusion in the distal SFA and popliteal artery with only a diseased peroneal artery as runoff distally.    Disposition: Patient was taken to the recovery room in stable condition having tolerated the procedure well.  Complications: None  [name] 08/16/2017 2:18 PM   This note was created with Dragon Medical transcription system. Any errors in dictation are purely unintentional.

## 2017-08-16 NOTE — Discharge Instructions (Signed)
Groin Insertion Instructions-If you lose feeling or develop tingling or pain in your leg or foot after the procedure, please walk around first.  If the discomfort does not improve , contact your physician and proceed to the nearest emergency room.  Loss of feeling in your leg might mean that a blockage has formed in the artery and this can be appropriately treated.  Limit your activity for the next two days after your procedure.  Avoid stooping, bending, heavy lifting or exertion as this may put pressure on the insertion site.  Resume normal activities in 48 hours.  You may shower after 24 hours but avoid excessive warm water and do not scrub the site.  Remove clear dressing in 48 hours.  If you have had a closure device inserted, do not soak in a tub bath or a hot tub for at least one week. ° °No driving for 48 hours after discharge.  After the procedure, check the insertion site occasionally.  If any oozing occurs or there is apparent swelling, firm pressure over the site will prevent a bruise from forming.  You can not hurt anything by pressing directly on the site.  The pressure stops the bleeding by allowing a small clot to form.  If the bleeding continues after the pressure has been applied for more than 15 minutes, call 911 or go to the nearest emergency room.   ° °The x-ray dye causes you to pass a considerate amount of urine.  For this reason, you will be asked to drink plenty of liquids after the procedure to prevent dehydration.  You may resume you regular diet.  Avoid caffeine products.   ° °For pain at the site of your procedure, take non-aspirin medicines such as Tylenol. ° °Medications: A. Hold Metformin for 48 hours if applicable.  B. Continue taking all your present medications at home unless your doctor prescribes any changes. ° °Moderate Conscious Sedation, Adult, Care After °These instructions provide you with information about caring for yourself after your procedure. Your health care provider  may also give you more specific instructions. Your treatment has been planned according to current medical practices, but problems sometimes occur. Call your health care provider if you have any problems or questions after your procedure. °What can I expect after the procedure? °After your procedure, it is common: °· To feel sleepy for several hours. °· To feel clumsy and have poor balance for several hours. °· To have poor judgment for several hours. °· To vomit if you eat too soon. ° °Follow these instructions at home: °For at least 24 hours after the procedure: ° °· Do not: °? Participate in activities where you could fall or become injured. °? Drive. °? Use heavy machinery. °? Drink alcohol. °? Take sleeping pills or medicines that cause drowsiness. °? Make important decisions or sign legal documents. °? Take care of children on your own. °· Rest. °Eating and drinking °· Follow the diet recommended by your health care provider. °· If you vomit: °? Drink water, juice, or soup when you can drink without vomiting. °? Make sure you have little or no nausea before eating solid foods. °General instructions °· Have a responsible adult stay with you until you are awake and alert. °· Take over-the-counter and prescription medicines only as told by your health care provider. °· If you smoke, do not smoke without supervision. °· Keep all follow-up visits as told by your health care provider. This is important. °Contact a health care provider   if: °· You keep feeling nauseous or you keep vomiting. °· You feel light-headed. °· You develop a rash. °· You have a fever. °Get help right away if: °· You have trouble breathing. °This information is not intended to replace advice given to you by your health care provider. Make sure you discuss any questions you have with your health care provider. °Document Released: 09/06/2013 Document Revised: 04/20/2016 Document Reviewed: 03/07/2016 °Elsevier Interactive Patient Education © 2018  [employer] ° °

## 2017-08-17 ENCOUNTER — Encounter: Payer: Self-pay | Admitting: Vascular Surgery

## 2017-08-20 ENCOUNTER — Encounter (INDEPENDENT_AMBULATORY_CARE_PROVIDER_SITE_OTHER): Payer: Self-pay

## 2017-08-20 ENCOUNTER — Encounter: Payer: Medicare Other | Admitting: Surgery

## 2017-08-20 DIAGNOSIS — E11622 Type 2 diabetes mellitus with other skin ulcer: Secondary | ICD-10-CM | POA: Diagnosis not present

## 2017-08-22 NOTE — Progress Notes (Signed)
SENTORIA, BRENT ([id number]) Visit Report for 08/20/2017 Chief Complaint Document Details Patient Name: [name], [name] P. Date of Service: 08/20/2017 2:15 PM Medical Record Number: [id number] Patient Account Number: [id number] Date of Birth/Sex: 09-02-1926 (83 y.o. Female) Treating RN: Carolyne Fiscal, Debi Primary Care Provider: Lucianne Lei Other Clinician: Referring Provider: Lucianne Lei Treating Provider/Extender: Frann Rider in Treatment: 12 Information Obtained from: Patient Chief Complaint Patients presents for treatment of an open diabetic ulcer 2 the left lower anterior leg which she sustained an injury by blunt trauma Electronic Signature(s) Signed: 08/20/2017 4:09:30 PM By: Christin Fudge MD, FACS Entered By: Christin Fudge on 08/20/2017 15:34:42 Melichar, ZWCHENID P. ([id number]) -------------------------------------------------------------------------------- Debridement Details Patient Name: Alexa Lowes P. Date of Service: 08/20/2017 2:15 PM Medical Record Number: [id number] Patient Account Number: [id number] Date of Birth/Sex: 1926/03/01 (83 y.o. Female) Treating RN: Carolyne Fiscal, Debi Primary Care Provider: Lucianne Lei Other Clinician: Referring Provider: Lucianne Lei Treating Provider/Extender: Frann Rider in Treatment: 12 Debridement Performed for Wound #1 Left,Anterior Lower Leg Assessment: Performed By: Physician Christin Fudge, MD Debridement: Debridement Severity of Tissue Pre Fat layer exposed Debridement: Pre-procedure Verification/Time Out Yes - 15:26 Taken: Start Time: 15:26 Pain Control: Lidocaine 4% Topical Solution Level: Skin/Subcutaneous Tissue Total Area Debrided (L x 6.9 (cm) x 2.3 (cm) = 15.87 (cm) W): Tissue and other Viable, Non-Viable, Eschar, Fibrin/Slough, Subcutaneous material debrided: Instrument: Curette Bleeding: Minimum Hemostasis Achieved: Pressure End Time: 15:29 Procedural Pain: 0 Post Procedural Pain:  0 Response to Treatment: Procedure was tolerated well Post Debridement Measurements of Total Wound Length: (cm) 6.9 Width: (cm) 2.3 Depth: (cm) 0.2 Volume: (cm) 2.493 Character of Wound/Ulcer Post Improved Debridement: Severity of Tissue Post Debridement: Fat layer exposed Post Procedure Diagnosis Same as Pre-procedure Electronic Signature(s) Signed: 08/20/2017 4:09:30 PM By: Christin Fudge MD, FACS Signed: 08/20/2017 5:11:04 PM By: Gerome Apley, [name] ([id number]) Entered By: Christin Fudge on 08/20/2017 15:34:35 [name], [name] ([id number]) -------------------------------------------------------------------------------- HPI Details Patient Name: Alexa Lowes P. Date of Service: 08/20/2017 2:15 PM Medical Record Number: [id number] Patient Account Number: [id number] Date of Birth/Sex: 1926/07/09 (83 y.o. Female) Treating RN: Carolyne Fiscal, Debi Primary Care Provider: Lucianne Lei Other Clinician: Referring Provider: Lucianne Lei Treating Provider/Extender: Frann Rider in Treatment: 12 History of Present Illness Location: left anterior shin Quality: Patient reports experiencing a dull pain to affected area(s). Severity: Patient states wound are getting worse. Duration: Patient has had the wound for < 3 weeks prior to presenting for treatment Timing: Pain in wound is Intermittent (comes and goes Context: The wound occurred when the patient blunt trauma against her left shin Modifying Factors: Other treatment(s) tried include:active Bactroban ointment locally Associated Signs and Symptoms: Patient reports having: occasional discharge from the wound HPI Description: 83 year old patient was seen in the ED about 10 days ago for a history of abrasion to the left lower extremity while she was boarding a bus and scraped the anterior part of her left leg. She has been a diabetic for many years and has been taking treatment regularly. Her past medical history is  also significant for anemia arthritis constipation and hypertension and is status post abdominal hysterectomy. She has never been a smoker. after her ER visit she was advised to apply Bactroban ointment to the wound twice a day and continue to monitor her blood glucose levels. her last hemoglobin A1c was done 3 years ago and was 6.6% 06/04/17 patient left anterior shin wound appears to be doing well although it is still somewhat dry despite the treatment with Medihoney.  We have been using Kerlex following the Medihoney application and I believe this is just not retaining that much moisture. Nonetheless there is no evidence of infection. 06/11/17 on evaluation today patient appears to be doing a little bit worse in regard to her wound. The entirety of the wound is dry and unfortunately the Medihoney does not seem to be helping this. That is even with the dressing changes that I made last week to try to retain more moisture. She has not tried [employer] as of yet. She was also noncompressible when testing for ABIs. 07/01/2017 -- she had a arterial studies done and was seen by Dr. Lucky Cowboy. her noninvasive studies showed noncompressible vessels on the right but brisk waveforms and digital pressures of 71 on the right consistent with only mild arterial insufficienc. On the left her ABI was 0.47 and this may be falsely elevated due to calcification. No digital pressures were obtained on the left and this is consistent with severe arterial insufficiency. this critical limb threatening situation on the left, would make wound healing difficult and it represents a serious situation and he has recommended an angiography with possible revascularization. The patient desired to defer the decision to she discuss with the family members. 07/09/17 on evaluation today patient appears to be doing fairly well in regard to her left anterior lower extremity wound. She has been tolerating the dressing changes without complication  we have been utilizing Santyl at this time. She tells me that she is having no significant pain compared to what she has had in the past. She has decided after discussing with family that she is going to go forward with the surgery with Dr. dew. This is to restore better blood flow to left lower extremity. With that it does appear that her lower extremity wound is making some progress albeit slow. No fevers, chills, nausea, or vomiting noted at this [name], [name] ([id number]) time. 07/16/17 on evaluation today patient's wound appears to be doing roughly about the same although some of the slough is clearing off. [name] she has her appointment with vascular next week on Thursday the 23rd. With that being said her wound does not appear to be hurting as badly as it has in the past which is good news. No fevers, chills, nausea, or vomiting noted at this time. 07/30/2017 -- she had surgery on 07/22/2017 --indications being nonhealing ulcer on the left leg with a noninvasive study showing marked reduction in ABI of less than 0.5 and no digital pressure on her left leg. she had a percutaneous transluminal angioplasty of the left common and external iliac arteries. She then had a stent placed to the left external iliac arteries and to the left common iliac artery. 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 The flow distally was poor and there were multiple areas of high-grade stenosis or occlusion of the distal SFA and the popliteal artery with only a diseased peroneal artery as runoff distally. She has a postop appointment on September 23 08/20/2017 -- she went back to the vascular office for an appointment on 08/12/2017 and bilateral ABIs were done which were notable for moderate right lower extremity arterial disease and unable to obtain left ankle brachial indices due to absent Doppler velocities in the  posterior tibial artery and the anterior tibial artery. Monophasic waveforms to the left femoral and popliteal artery. this was worrisome for a failure of stents placed  and lower extremity angiogram was recommended. as was done on 08/16/2017 and the stents placed in the left eye Lex system had some mild narrowing at the iliac bifurcation and the leading edge of the stent proximally was poor wall apposition with stenosis in the 50-60% range in the right common iliac artery. There was also occlusion of the common femoral artery and the origin of the SFA and profunda femoris artery. Flow distally was poor but they appeared to be multiple areas of high-grade stenosis or occlusion in the distal SFA and popliteal artery with only a diseased peroneal artery. in another stent was placed and deployed. The femoral occlusion would need to be treated surgically. Electronic Signature(s) Signed: 08/20/2017 4:09:30 PM By: Christin Fudge MD, FACS Entered By: Christin Fudge on 08/20/2017 15:34:46 [name], [name] ([id number]) -------------------------------------------------------------------------------- Physical Exam Details Patient Name: Alexa Lowes P. Date of Service: 08/20/2017 2:15 PM Medical Record Number: [id number] Patient Account Number: [id number] Date of Birth/Sex: 01/07/1926 (83 y.o. Female) Treating RN: Carolyne Fiscal, Debi Primary Care Provider: Lucianne Lei Other Clinician: Referring Provider: Lucianne Lei Treating Provider/Extender: Frann Rider in Treatment: 12 Constitutional . Pulse regular. Respirations normal and unlabored. Afebrile. . Eyes Nonicteric. Reactive to light. Ears, Nose, Mouth, and Throat Lips, teeth, and gums WNL.Marland Kitchen Moist mucosa without lesions. Neck supple and nontender. No palpable supraclavicular or cervical adenopathy. Normal sized without goiter. Respiratory WNL. No retractions.. Cardiovascular Pedal Pulses WNL. No clubbing, cyanosis or  edema. Lymphatic No adneopathy. No adenopathy. No adenopathy. Musculoskeletal Adexa without tenderness or enlargement.. Digits and nails w/o clubbing, cyanosis, infection, petechiae, ischemia, or inflammatory conditions.. Integumentary (Hair, Skin) No suspicious lesions. No crepitus or fluctuance. No peri-wound warmth or erythema. No masses.Marland Kitchen Psychiatric Judgement and insight Intact.. No evidence of depression, anxiety, or agitation.. Notes the patient had a lot of slough on the wound and this was sharply debrided with a #3 curet and a lot of the slough was removed with minimal bleeding. This was controlled with pressure. Electronic Signature(s) Signed: 08/20/2017 4:09:30 PM By: Christin Fudge MD, FACS Entered By: Christin Fudge on 08/20/2017 15:35:25 [name] ([id number]) -------------------------------------------------------------------------------- Physician Orders Details Patient Name: Alexa Lowes P. Date of Service: 08/20/2017 2:15 PM Medical Record Number: [id number] Patient Account Number: [id number] Date of Birth/Sex: 1926-08-02 (83 y.o. Female) Treating RN: Montey Hora Primary Care Provider: Lucianne Lei Other Clinician: Referring Provider: Lucianne Lei Treating Provider/Extender: Frann Rider in Treatment: 12 Verbal / Phone Orders: No Diagnosis Coding Wound Cleansing Wound #1 Left,Anterior Lower Leg o Clean wound with Normal Saline. o May Shower, gently pat wound dry prior to applying new dressing. Anesthetic Wound #1 Left,Anterior Lower Leg o Topical Lidocaine 4% cream applied to wound bed prior to debridement Skin Barriers/Peri-Wound Care Wound #1 Left,Anterior Lower Leg o Skin Prep Primary Wound Dressing Wound #1 Left,Anterior Lower Leg o Santyl Ointment Secondary Dressing Wound #1 Left,Anterior Lower Leg o Dry Gauze o Boardered Foam Dressing o Other - stretch netting #4 Dressing Change Frequency Wound #1  Left,Anterior Lower Leg o Change dressing every day. Follow-up Appointments Wound #1 Left,Anterior Lower Leg o Return Appointment in 1 week. Edema Control Wound #1 Left,Anterior Lower Leg o Elevate legs to the level of the heart and pump ankles as often as possible Vachon, Intisar P. ([id number]) Additional Orders / Instructions Wound #1 Left,Anterior Lower Leg o Increase protein intake. o Other: - Please add vitamin A, vitamin C and zinc supplements to your diet Electronic Signature(s) Signed: 08/20/2017 4:09:30 PM By: Christin Fudge MD, FACS Signed:  08/20/2017 5:20:28 PM By: Montey Hora Entered By: Montey Hora on 08/20/2017 15:29:51 [name], [name] ([id number]) -------------------------------------------------------------------------------- Problem List Details Patient Name: Alexa Lowes P. Date of Service: 08/20/2017 2:15 PM Medical Record Number: [id number] Patient Account Number: [id number] Date of Birth/Sex: Jul 11, 1926 (83 y.o. 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 ([id number]) -------------------------------------------------------------------------------- Progress Note Details Patient Name: Alexa Lowes P. Date of Service: 08/20/2017 2:15 PM Medical Record Number: [id number] Patient Account  Number: [id number] Date of Birth/Sex: [birth year] (83 y.o. Female) Treating RN: Carolyne Fiscal, Debi Primary Care Provider: Lucianne Lei Other Clinician: Referring Provider: Lucianne Lei Treating Provider/Extender: Frann Rider in Treatment: 12 Subjective Chief Complaint Information obtained from Patient Patients presents for treatment of an open diabetic ulcer 2 the left lower anterior leg which she sustained an injury by blunt trauma History of Present Illness (HPI) The following HPI elements were documented for the patient's wound: Location: left anterior shin Quality: Patient reports experiencing a dull pain to affected area(s). Severity: Patient states wound are getting worse. Duration: Patient has had the wound for < 3 weeks prior to presenting for treatment Timing: Pain in wound is Intermittent (comes and goes Context: The wound occurred when the patient blunt trauma against her left shin Modifying Factors: Other treatment(s) tried include:active Bactroban ointment locally Associated Signs and Symptoms: Patient reports having: occasional discharge from the wound 83 year old patient was seen in the ED about 10 days ago for a history of abrasion to the left lower extremity while she was boarding a bus and scraped the anterior part of her left leg. She has been a diabetic for many years and has been taking treatment regularly. Her past medical history is also significant for anemia arthritis constipation and hypertension and is status post abdominal hysterectomy. She has never been a smoker. after her ER visit she was advised to apply Bactroban ointment to the wound twice a day and continue to monitor her blood glucose levels. her last hemoglobin A1c was done 3 years ago and was 6.6% 06/04/17 patient left anterior shin wound appears to be doing well although it is still somewhat dry despite the treatment with Medihoney. We have been using Kerlex following the Medihoney application and  I believe this is just not retaining that much moisture. Nonetheless there is no evidence of infection. 06/11/17 on evaluation today patient appears to be doing a little bit worse in regard to her wound. The entirety of the wound is dry and unfortunately the Medihoney does not seem to be helping this. That is even with the dressing changes that I made last week to try to retain more moisture. She has not tried [employer] as of yet. She was also noncompressible when testing for ABIs. 07/01/2017 -- she had a arterial studies done and was seen by Dr. Lucky Cowboy. her noninvasive studies showed noncompressible vessels on the right but brisk waveforms and digital pressures of 71 on the right consistent with only mild arterial insufficienc. On the left her ABI was 0.47  and this may be falsely elevated due to calcification. No digital pressures were obtained on the left and this is consistent with severe arterial insufficiency. this critical limb threatening situation on the left, would make wound healing difficult and it [name], [name] ([id number]) represents a serious situation and he has recommended an angiography with possible revascularization. The patient desired to defer the decision to she discuss with the family members. 07/09/17 on evaluation today patient appears to be doing fairly well in regard to her left anterior lower extremity wound. She has been tolerating the dressing changes without complication we have been utilizing Santyl at this time. She tells me that she is having no significant pain compared to what she has had in the past. She has decided after discussing with family that she is going to go forward with the surgery with Dr. dew. This is to restore better blood flow to left lower extremity. With that it does appear that her lower extremity wound is making some progress albeit slow. No fevers, chills, nausea, or vomiting noted at this time. 07/16/17 on evaluation today patient's wound  appears to be doing roughly about the same although some of the slough is clearing off. [name] she has her appointment with vascular next week on Thursday the 23rd. With that being said her wound does not appear to be hurting as badly as it has in the past which is good news. No fevers, chills, nausea, or vomiting noted at this time. 07/30/2017 -- she had surgery on 07/22/2017 --indications being nonhealing ulcer on the left leg with a noninvasive study showing marked reduction in ABI of less than 0.5 and no digital pressure on her left leg. she had a percutaneous transluminal angioplasty of the left common and external iliac arteries. She then had a stent placed to the left external iliac arteries and to the left common iliac artery. 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 The flow distally was poor and there were multiple areas of high-grade stenosis or occlusion of the distal SFA and the popliteal artery with only a diseased peroneal artery as runoff distally. She has a postop appointment on September 23 08/20/2017 -- she went back to the vascular office for an appointment on 08/12/2017 and bilateral ABIs were done which were notable for moderate right lower extremity arterial disease and unable to obtain left ankle brachial indices due to absent Doppler velocities in the posterior tibial artery and the anterior tibial artery. Monophasic waveforms to the left femoral and popliteal artery. this was worrisome for a failure of stents placed and lower extremity angiogram was recommended. as was done on 08/16/2017 and the stents placed in the left eye Lex system had some mild narrowing at the iliac bifurcation and the leading edge of the stent proximally was poor wall apposition with stenosis in the 50-60% range in the right common iliac artery. There was also occlusion of the common femoral artery and the  origin of the SFA and profunda femoris artery. Flow distally was poor but they appeared to be multiple areas of high-grade stenosis or occlusion in the distal SFA and popliteal artery with only a diseased peroneal artery. in another stent was placed and deployed. The femoral occlusion would need to be treated surgically. Objective Constitutional Pulse regular. Respirations normal and unlabored. Afebrile. [name], [name] ([id number]) Vitals Time Taken: 2:50 PM, Height: 65 in, Weight: 131 lbs, BMI: 21.8, Temperature:  98.0 F, Pulse: 98 bpm, Respiratory Rate: 16 breaths/min, Blood Pressure: 160/73 mmHg. Eyes Nonicteric. Reactive to light. Ears, Nose, Mouth, and Throat Lips, teeth, and gums WNL.Marland Kitchen Moist mucosa without lesions. Neck supple and nontender. No palpable supraclavicular or cervical adenopathy. Normal sized without goiter. Respiratory WNL. No retractions.. Cardiovascular Pedal Pulses WNL. No clubbing, cyanosis or edema. Lymphatic No adneopathy. No adenopathy. No adenopathy. Musculoskeletal Adexa without tenderness or enlargement.. Digits and nails w/o clubbing, cyanosis, infection, petechiae, ischemia, or inflammatory conditions.Marland Kitchen Psychiatric Judgement and insight Intact.. No evidence of depression, anxiety, or agitation.. General Notes: the patient had a lot of slough on the wound and this was sharply debrided with a #3 curet and a lot of the slough was removed with minimal bleeding. This was controlled with pressure. Integumentary (Hair, Skin) No suspicious lesions. No crepitus or fluctuance. No peri-wound warmth or erythema. No masses.. Wound #1 status is Open. Original cause of wound was Trauma. The wound is located on the Left,Anterior Lower Leg. The wound measures 6.9cm length x 2.3cm width x 0.1cm depth; 12.464cm^2 area and 1.246cm^3 volume. There is Fat Layer (Subcutaneous Tissue) Exposed exposed. There is no tunneling or undermining noted. There is a large amount  of serous drainage noted. The wound margin is flat and intact. There is no granulation within the wound bed. There is a large (67-100%) amount of necrotic tissue within the wound bed including Adherent Slough. The periwound skin appearance exhibited: Scarring, Erythema. The periwound skin appearance did not exhibit: Callus, Crepitus, Excoriation, Induration, Rash, Dry/Scaly, Maceration, Atrophie Blanche, Cyanosis, Ecchymosis, Hemosiderin Staining, Mottled, Pallor, Rubor. The surrounding wound skin color is noted with erythema which is circumferential. Periwound temperature was noted as No Abnormality. The periwound has tenderness on palpation. 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 There was a Skin/Subcutaneous Tissue Debridement ([id number]) debridement with total area of 15.87 sq cm performed by Christin Fudge, MD. with the following instrument(s): Curette to remove Viable and Non-Viable tissue/material including Fibrin/Slough, Eschar, and Subcutaneous after achieving pain control using Lidocaine 4% Topical Solution. A time out was conducted at 15:26, prior to the start of the procedure. A Minimum amount of bleeding was controlled with Pressure. The procedure was tolerated well with a pain level of 0 throughout and a pain level of 0 following the procedure. Post Debridement Measurements: 6.9cm length x 2.3cm width x 0.2cm depth; 2.493cm^3 volume. Character of Wound/Ulcer Post Debridement is improved. Severity of Tissue Post Debridement is: Fat  layer exposed. Post procedure Diagnosis Wound #1: Same as Pre-Procedure Plan Wound Cleansing: Wound #1 Left,Anterior Lower Leg: Clean wound with Normal Saline. May Shower, gently pat wound dry prior to applying new dressing. Anesthetic: Wound #1 Left,Anterior Lower Leg: Topical Lidocaine 4% cream applied to wound bed prior to debridement Skin Barriers/Peri-Wound Care: Wound #1 Left,Anterior Lower Leg: Skin Prep Primary Wound Dressing: Wound #1 Left,Anterior Lower Leg: Santyl Ointment Secondary Dressing: [name], [name]. ([id number]) Wound #1 Left,Anterior Lower Leg: Dry Gauze Boardered Foam Dressing Other - stretch netting #4 Dressing Change Frequency: Wound #1 Left,Anterior Lower Leg: Change dressing every day. Follow-up Appointments: Wound #1 Left,Anterior Lower Leg: Return  Appointment in 1 week. Edema Control: Wound #1 Left,Anterior Lower Leg: Elevate legs to the level of the heart and pump ankles as often as possible Additional Orders / Instructions: Wound #1 Left,Anterior Lower Leg: Increase protein intake. Other: - Please add vitamin A, vitamin C and zinc supplements to your diet I have reviewed her recent vascular intervention procedure and after sharp debridement today, I have recommended: 1. Daily washing with soap and water and application of Santyl ointment locally with a light Kerlix dressing. 2. follow-up with the vascular surgeon for a possible planned procedure with open surgery 3. good control of her diabetes mellitus 4. Adequate protein, vitamin A, vitamin C and zinc 5. Regular visits the wound [name] Electronic Signature(s) Signed: 08/20/2017 4:09:30 PM By: Christin Fudge MD, FACS Entered By: Christin Fudge on 08/20/2017 15:36:24 [name], [name] ([id number]) -------------------------------------------------------------------------------- SuperBill Details Patient Name: Alexa Lowes P. Date of Service: 08/20/2017 Medical Record Number:  [id number] Patient Account Number: [id number] Date of Birth/Sex: 09/22/1926 (83 y.o. 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 Facility Procedures CPT4 Code Description: 76811572 11042 - DEB SUBQ TISSUE 20 SQ CM/< ICD-10 Description Diagnosis E11.622 Type 2 diabetes mellitus with other skin ulcer L97.222 Non-pressure chronic ulcer of left calf with fat layer e I20.355 Atherosclerosis of native  arteries of left leg with ulce Modifier: xposed ration of ca Quantity: 1 lf Physician Procedures CPT4 Code Description: 9741638 45364 - WC PHYS LEVEL 3 - EST PT ICD-10 Description Diagnosis E11.622 Type 2 diabetes mellitus with other skin ulcer L97.222 Non-pressure chronic ulcer of left calf with fat layer e W80.321 Atherosclerosis of native  arteries of left leg with ulce Modifier: 25 xposed ration of ca Quantity: 1 lf CPT4 Code Description: 2248250 11042 - WC PHYS SUBQ TISS 20 SQ CM ICD-10 Description Diagnosis E11.622 Type 2 diabetes mellitus with other skin ulcer 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

## 2017-08-23 ENCOUNTER — Other Ambulatory Visit (INDEPENDENT_AMBULATORY_CARE_PROVIDER_SITE_OTHER): Payer: Self-pay | Admitting: Vascular Surgery

## 2017-08-23 NOTE — Progress Notes (Signed)
[name], [name] ([id number]) Visit Report for 08/20/2017 Arrival Information Details Patient Name: [name], [name]. Date of Service: 08/20/2017 2:15 PM Medical Record Number: [id number] Patient Account Number: [id number] Date of Birth/Sex: [birth year] (83 y.o. Female) Treating RN: Montey Hora Primary Care Tuff Clabo: Lucianne Lei Other Clinician: Referring Yuette Putnam: Lucianne Lei Treating Lagina Reader/Extender: Frann Rider in Treatment: 12 Visit Information History Since Last Visit Added or deleted any medications: No Patient Arrived: Ambulatory Any new allergies or adverse reactions: No Arrival Time: 14:47 Had a fall or experienced change in No Accompanied By: granddaughter activities of daily living that may affect Transfer Assistance: None risk of falls: Patient Identification Verified: Yes Signs or symptoms of abuse/neglect since last No Secondary Verification Process Yes visito Completed: Hospitalized since last visit: No Patient Requires Transmission- No Has Dressing in Place as Prescribed: Yes Based Precautions: Pain Present Now: Yes Patient Has Alerts: Yes Electronic Signature(s) Signed: 08/20/2017 5:20:28 PM By: Montey Hora Entered By: Montey Hora on 08/20/2017 14:48:11 [name], [name]. ([id number]) -------------------------------------------------------------------------------- Encounter Discharge Information Details Patient Name: Alexa Lowes [name]. Date of Service: 08/20/2017 2:15 PM Medical Record Number: [id number] Patient Account Number: [id number] Date of Birth/Sex: Jul 21, 1926 (83 y.o. Female) Treating RN: Montey Hora Primary Care Anushri Casalino: Lucianne Lei Other Clinician: Referring Bekka Qian: Lucianne Lei Treating Laverle Pillard/Extender: Frann Rider in Treatment: 12 Encounter Discharge Information Items Discharge Pain Level: 0 Discharge Condition: Stable Ambulatory Status: Cane Discharge Destination: Home Transportation: Private  Auto Accompanied By: dtr Schedule Follow-up Appointment: Yes Medication Reconciliation completed No and provided to Patient/Care [place]: Provided on Clinical Summary of Care: 08/20/2017 Form Type Recipient Paper Patient GW Electronic Signature(s) Signed: 08/23/2017 9:26:52 AM By: Ruthine Dose Entered By: Ruthine Dose on 08/20/2017 15:38:07 [name], [name] ([id number]) -------------------------------------------------------------------------------- Lower Extremity Assessment Details Patient Name: Alexa Lowes [name]. Date of Service: 08/20/2017 2:15 PM Medical Record Number: [id number] Patient Account Number: [id number] Date of Birth/Sex: 05-24-1926 (83 y.o. Female) Treating RN: Montey Hora Primary Care Jervis Trapani: Lucianne Lei Other Clinician: Referring Kaevion Sinclair: Lucianne Lei Treating Shian Goodnow/Extender: Frann Rider in Treatment: 12 Edema Assessment Assessed: [Left: No] [Right: No] E[Left: dema] [Right: :] Calf Left: Right: Point of Measurement: 34 cm From Medial Instep 29 cm cm Ankle Left: Right: Point of Measurement: 8 cm From Medial Instep 20.4 cm cm Vascular Assessment Pulses: Dorsalis Pedis Palpable: [Left:Yes] Posterior Tibial Extremity colors, hair growth, and conditions: Extremity Color: [Left:Hyperpigmented] Hair Growth on Extremity: [Left:No] Temperature of Extremity: [Left:Cool] Capillary Refill: [Left:< 3 seconds] Toe Nail Assessment Left: Right: Thick: Yes Discolored: Yes Deformed: Yes Improper Length and Hygiene: No Electronic Signature(s) Signed: 08/20/2017 5:20:28 PM By: Montey Hora Entered By: Montey Hora on 08/20/2017 15:09:54 [name], [name]. ([id number]) -------------------------------------------------------------------------------- Multi Wound Chart Details Patient Name: Alexa Lowes [name]. Date of Service: 08/20/2017 2:15 PM Medical Record Number: [id number] Patient Account Number: [id number] Date of Birth/Sex:  1926/04/23 (83 y.o. Female) Treating RN: Montey Hora Primary Care Cederic Mozley: Lucianne Lei Other Clinician: Referring Rajanee Schuelke: Lucianne Lei Treating Naomee Nowland/Extender: Frann Rider in Treatment: 12 Vital Signs Height(in): 65 Pulse(bpm): 98 Weight(lbs): 131 Blood Pressure 160/73 (mmHg): Body Mass Index(BMI): 22 Temperature(F): 98.0 Respiratory Rate 16 (breaths/min): Photos: [1:No Photos] [N/A:N/A] Wound Location: [1:Left Lower Leg - Anterior] [N/A:N/A] Wounding Event: [1:Trauma] [N/A:N/A] Primary Etiology: [1:Diabetic Wound/Ulcer of the Lower Extremity] [N/A:N/A] Secondary Etiology: [1:Trauma, Other] [N/A:N/A] Comorbid History: [1:Anemia, Hypertension, Type II Diabetes, Osteoarthritis] [N/A:N/A] Date Acquired: [1:05/02/2017] [N/A:N/A] Weeks of Treatment: [1:12] [N/A:N/A] Wound Status: [1:Open] [N/A:N/A] Measurements L x W x D 6.9x2.3x0.1 [N/A:N/A] (cm) Area (cm) : [0:63.016] [  N/A:N/A] Volume (cm) : [1:1.246] [N/A:N/A] % Reduction in Area: [1:-159.30%] [N/A:N/A] % Reduction in Volume: -29.70% [N/A:N/A] Classification: [1:Grade 1] [N/A:N/A] Exudate Amount: [1:Large] [N/A:N/A] Exudate Type: [1:Serous] [N/A:N/A] Exudate Color: [1:amber] [N/A:N/A] Wound Margin: [1:Flat and Intact] [N/A:N/A] Granulation Amount: [1:None Present (0%)] [N/A:N/A] Necrotic Amount: [1:Large (67-100%)] [N/A:N/A] Exposed Structures: [1:Fat Layer (Subcutaneous Tissue) Exposed: Yes Fascia: No Tendon: No Muscle: No] [N/A:N/A] Joint: No Bone: No Epithelialization: None N/A N/A Debridement: Debridement (22025- N/A N/A 11047) Pre-procedure 15:26 N/A N/A Verification/Time Out Taken: Pain Control: Lidocaine 4% Topical N/A N/A Solution Tissue Debrided: Necrotic/Eschar, N/A N/A Fibrin/Slough, Subcutaneous Level: Skin/Subcutaneous N/A N/A Tissue Debridement Area (sq 15.87 N/A N/A cm): Instrument: Curette N/A N/A Bleeding: Minimum N/A N/A Hemostasis Achieved: Pressure N/A N/A Procedural  Pain: 0 N/A N/A Post Procedural Pain: 0 N/A N/A Debridement Treatment Procedure was tolerated N/A N/A Response: well Post Debridement 6.9x2.3x0.2 N/A N/A Measurements L x W x D (cm) Post Debridement 2.493 N/A N/A Volume: (cm) Periwound Skin Texture: Scarring: Yes N/A N/A Excoriation: No Induration: No Callus: No Crepitus: No Rash: No Periwound Skin Maceration: No N/A N/A Moisture: Dry/Scaly: No Periwound Skin Color: Erythema: Yes N/A N/A Atrophie Blanche: No Cyanosis: No Ecchymosis: No Hemosiderin Staining: No Mottled: No Pallor: No Rubor: No Erythema Location: Circumferential N/A N/A Temperature: No Abnormality N/A N/A Tenderness on Yes N/A N/A Palpation: Wound Preparation: N/A N/A [name], [name] ([id number]) Ulcer Cleansing: Rinsed/Irrigated with Saline Topical Anesthetic Applied: Other: lidocaine 4% Procedures Performed: Debridement N/A N/A Treatment Notes Electronic Signature(s) Signed: 08/20/2017 4:09:30 PM By: Christin Fudge MD, FACS Entered By: Christin Fudge on 08/20/2017 15:34:14 Pope, [name] ([id number]) -------------------------------------------------------------------------------- Multi-Disciplinary Care Plan Details Patient Name: Alexa Lowes [name]. Date of Service: 08/20/2017 2:15 PM Medical Record Number: [id number] Patient Account Number: [id number] Date of Birth/Sex: Sep 24, 1926 (83 y.o. Female) Treating RN: Montey Hora Primary Care Kiasha Bellin: Lucianne Lei Other Clinician: Referring Vetra Shinall: Lucianne Lei Treating Jayziah Bankhead/Extender: Frann Rider in Treatment: 12 Active Inactive ` Abuse / Safety / Falls / Self Care Management Nursing Diagnoses: Potential for falls Goals: Patient will remain injury free related to falls Date Initiated: 05/28/2017 Target Resolution Date: 07/23/2017 Goal Status: Active Interventions: Assess fall risk on admission and as needed Notes: ` Orientation to the Wound Care Program Nursing  Diagnoses: Knowledge deficit related to the wound healing center program Goals: Patient/caregiver will verbalize understanding of the [facility] Date Initiated: 05/28/2017 Target Resolution Date: 07/23/2017 Goal Status: Active Interventions: Provide education on orientation to the wound center Notes: ` Wound/Skin Impairment Nursing Diagnoses: Impaired tissue integrity Viruet, Eilidh [name]. ([id number]) Goals: Ulcer/skin breakdown will have a volume reduction of 30% by week 4 Date Initiated: 05/28/2017 Target Resolution Date: 07/23/2017 Goal Status: Active Ulcer/skin breakdown will have a volume reduction of 50% by week 8 Date Initiated: 05/28/2017 Target Resolution Date: 07/23/2017 Goal Status: Active Ulcer/skin breakdown will have a volume reduction of 80% by week 12 Date Initiated: 05/28/2017 Target Resolution Date: 07/23/2017 Goal Status: Active Ulcer/skin breakdown will heal within 14 weeks Date Initiated: 05/28/2017 Target Resolution Date: 07/23/2017 Goal Status: Active Interventions: Assess patient/caregiver ability to obtain necessary supplies Assess patient/caregiver ability to perform ulcer/skin care regimen upon admission and as needed Assess ulceration(s) every visit Notes: Electronic Signature(s) Signed: 08/20/2017 5:20:28 PM By: Montey Hora Entered By: Montey Hora on 08/20/2017 15:26:19 [name], [name]. ([id number]) -------------------------------------------------------------------------------- Pain Assessment Details Patient Name: Alexa Lowes [name]. Date of Service: 08/20/2017 2:15 PM Medical Record Number: [id number] Patient Account Number: [id number] Date of Birth/Sex: 02-Jul-1926 (83  y.o. Female) Treating RN: Montey Hora Primary Care Kathlen Sakurai: Lucianne Lei Other Clinician: Referring Lynleigh Kovack: Lucianne Lei Treating Jyasia Markoff/Extender: Frann Rider in Treatment: 12 Active Problems Location of Pain Severity and Description of  Pain Patient Has Paino Yes Site Locations Rate the pain. Current Pain Level: 5 Worst Pain Level: 6 Least Pain Level: 0 Pain Management and Medication Current Pain Management: Electronic Signature(s) Signed: 08/20/2017 5:20:28 PM By: Montey Hora Entered By: Montey Hora on 08/20/2017 14:48:52 [name], [name] ([id number]) -------------------------------------------------------------------------------- Patient/Caregiver Education Details Patient Name: Alexa Lowes [name]. Date of Service: 08/20/2017 2:15 PM Medical Record Number: [id number] Patient Account Number: [id number] Date of Birth/Gender: 09-15-1926 (83 y.o. Female) Treating RN: Montey Hora Primary Care Physician: Lucianne Lei Other Clinician: Referring Physician: Lucianne Lei Treating Physician/Extender: Frann Rider in Treatment: 12 Education Assessment Education Provided To: Patient and Caregiver Education Topics Provided Wound/Skin Impairment: Handouts: Other: wound care as ordered Methods: Demonstration, Explain/Verbal Responses: State content correctly Electronic Signature(s) Signed: 08/20/2017 5:20:28 PM By: Montey Hora Entered By: Montey Hora on 08/20/2017 15:27:46 [name], [name]. ([id number]) -------------------------------------------------------------------------------- Wound Assessment Details Patient Name: Alexa Lowes [name]. Date of Service: 08/20/2017 2:15 PM Medical Record Number: [id number] Patient Account Number: [id number] Date of Birth/Sex: [birth year] (83 y.o. Female) Treating RN: Montey Hora Primary Care Gorge Almanza: Lucianne Lei Other Clinician: Referring Darroll Bredeson: Lucianne Lei Treating Zahirah Cheslock/Extender: Frann Rider in Treatment: 12 Wound Status Wound Number: 1 Primary Diabetic Wound/Ulcer of the Lower Etiology: Extremity Wound Location: Left Lower Leg - Anterior Secondary Trauma, Other Wounding Event: Trauma Etiology: Date Acquired: 05/02/2017 Wound  Status: Open Weeks Of Treatment: 12 Comorbid Anemia, Hypertension, Type II Clustered Wound: No History: Diabetes, Osteoarthritis Wound Measurements Length: (cm) 6.9 Width: (cm) 2.3 Depth: (cm) 0.1 Area: (cm) 12.464 Volume: (cm) 1.246 % Reduction in Area: -159.3% % Reduction in Volume: -29.7% Epithelialization: None Tunneling: No Undermining: No Wound Description Classification: Grade 1 Wound Margin: Flat and Intact Exudate Amount: Large Exudate Type: Serous Exudate Color: amber Foul Odor After Cleansing: No Slough/Fibrino Yes Wound Bed Granulation Amount: None Present (0%) Exposed Structure Necrotic Amount: Large (67-100%) Fascia Exposed: No Necrotic Quality: Adherent Slough Fat Layer (Subcutaneous Tissue) Exposed: Yes Tendon Exposed: No Muscle Exposed: No Joint Exposed: No Bone Exposed: No Periwound Skin Texture Texture Color No Abnormalities Noted: No No Abnormalities Noted: No Callus: No Atrophie Blanche: No Crepitus: No Cyanosis: No Excoriation: No Ecchymosis: No Induration: No Erythema: Yes [name], [name]. ([id number]) Rash: No Erythema Location: Circumferential Scarring: Yes Hemosiderin Staining: No Mottled: No Moisture Pallor: No No Abnormalities Noted: No Rubor: No Dry / Scaly: No Maceration: No Temperature / Pain Temperature: No Abnormality Tenderness on Palpation: Yes Wound Preparation Ulcer Cleansing: Rinsed/Irrigated with Saline Topical Anesthetic Applied: Other: lidocaine 4%, Treatment Notes Wound #1 (Left, Anterior Lower Leg) 1. Cleansed with: Clean wound with Normal Saline 2. Anesthetic Topical Lidocaine 4% cream to wound bed prior to debridement 4. Dressing Applied: Santyl Ointment 5. Secondary Dressing Applied Bordered Foam Dressing Dry Gauze Notes netting Electronic Signature(s) Signed: 08/20/2017 5:20:28 PM By: Montey Hora Entered By: Montey Hora on 08/20/2017 15:08:18 Popowski, [name]  ([id number]) -------------------------------------------------------------------------------- Vitals Details Patient Name: Alexa Lowes [name]. Date of Service: 08/20/2017 2:15 PM Medical Record Number: [id number] Patient Account Number: [id number] Date of Birth/Sex: [birth year] (83 y.o. Female) Treating RN: Montey Hora Primary Care Keno Caraway: Lucianne Lei Other Clinician: Referring Jakhia Buxton: Lucianne Lei Treating Evolet Salminen/Extender: Frann Rider in Treatment: 12 Vital Signs Time Taken: 14:50 Temperature (F): 98.0 Height (in): 65 Pulse (bpm): 98 Weight (lbs): 131  Respiratory Rate (breaths/min): 16 Body Mass Index (BMI): 21.8 Blood Pressure (mmHg): 160/73 Reference Range: 80 - 120 mg / dl Electronic Signature(s) Signed: 08/20/2017 5:20:28 PM By: Montey Hora Entered By: Montey Hora on 08/20/2017 14:51:00

## 2017-08-27 ENCOUNTER — Encounter: Payer: Medicare Other | Admitting: Physician Assistant

## 2017-08-27 DIAGNOSIS — E11622 Type 2 diabetes mellitus with other skin ulcer: Secondary | ICD-10-CM | POA: Diagnosis not present

## 2017-08-29 NOTE — Progress Notes (Signed)
[name] ([id number]) Visit Report for 08/27/2017 Chief Complaint Document Details Patient Name: [name], [name] Date of Service: 08/27/2017 3:30 PM Medical Record Number: [id number] Patient Account Number: [id number] Date of Birth/Sex: 05/09/1926 (83 y.o. Female) Treating RN: Cornell Barman Primary Care Provider: Lucianne Lei Other Clinician: Referring Provider: Lucianne Lei Treating Provider/Extender: Melburn Hake, Brailon Don Weeks in Treatment: 13 Information Obtained from: Patient Chief Complaint Patients presents for treatment of an open diabetic ulcer 2 the left lower anterior leg which she sustained an injury by blunt trauma Electronic Signature(s) Signed: 08/27/2017 5:13:37 PM By: Worthy Keeler PA-C Entered By: Worthy Keeler on 08/27/2017 16:14:48 [name], [name] ([id number]) -------------------------------------------------------------------------------- Debridement Details Patient Name: [name] P. Date of Service: 08/27/2017 3:30 PM Medical Record Number: [id number] Patient Account Number: [id number] Date of Birth/Sex: [birth year] (82 y.o. Female) Treating RN: Cornell Barman Primary Care Provider: Lucianne Lei Other Clinician: Referring Provider: Lucianne Lei Treating Provider/Extender: Melburn Hake, Zimal Weisensel Weeks in Treatment: 13 Debridement Performed for Wound #1 Left,Anterior Lower Leg Assessment: Performed By: Physician STONE III, Hulda Reddix E., PA-C Debridement: Chemical/enzymatic Severity of Tissue Pre Fat layer exposed Debridement: Debridement Non-Selective Description: Pre-procedure Verification/Time Out Yes - 16:15 Taken: Start Time: 16:15 End Time: 16:16 Procedural Pain: 0 Post Procedural Pain: 0 Response to Treatment: Procedure was tolerated well Post Debridement Measurements of Total Wound Length: (cm) 7.1 Width: (cm) 2.5 Depth: (cm) 0.1 Volume: (cm) 1.394 Character of Wound/Ulcer Post Stable Debridement: Severity of Tissue Post  Debridement: Fat layer exposed Post Procedure Diagnosis Same as Pre-procedure Electronic Signature(s) Signed: 08/27/2017 4:29:39 PM By: Gretta Cool, BSN, RN, CWS, Kim RN, BSN Signed: 08/27/2017 5:13:37 PM By: Worthy Keeler PA-C Entered By: Gretta Cool, BSN, RN, CWS, Kim on 08/27/2017 16:24:49 Mose, [name] ([id number]) -------------------------------------------------------------------------------- HPI Details Patient Name: [name] P. Date of Service: 08/27/2017 3:30 PM Medical Record Number: [id number] Patient Account Number: [id number] Date of Birth/Sex: November 01, 1926 (83 y.o. Female) Treating RN: Cornell Barman Primary Care Provider: Lucianne Lei Other Clinician: Referring Provider: Lucianne Lei Treating Provider/Extender: Melburn Hake, Angeliki Mates Weeks in Treatment: 13 History of Present Illness Location: left anterior shin Quality: Patient reports experiencing a dull pain to affected area(s). Severity: Patient states wound are getting worse. Duration: Patient has had the wound for < 3 weeks prior to presenting for treatment Timing: Pain in wound is Intermittent (comes and goes Context: The wound occurred when the patient blunt trauma against her left shin Modifying Factors: Other treatment(s) tried include:active Bactroban ointment locally Associated Signs and Symptoms: Patient reports having: occasional discharge from the wound HPI Description: 83 year old patient was seen in the ED about 10 days ago for a history of abrasion to the left lower extremity while she was boarding a bus and scraped the anterior part of her left leg. She has been a diabetic for many years and has been taking treatment regularly. Her past medical history is also significant for anemia arthritis constipation and hypertension and is status post abdominal hysterectomy. She has never been a smoker. after her ER visit she was advised to apply Bactroban ointment to the wound twice a day and continue to monitor her blood  glucose levels. her last hemoglobin A1c was done 3 years ago and was 6.6% 06/04/17 patient left anterior shin wound appears to be doing well although it is still somewhat dry despite the treatment with Medihoney. We have been using Kerlex following the Medihoney application and I believe this is just not retaining that much moisture. Nonetheless there is no evidence of infection.  06/11/17 on evaluation today patient appears to be doing a little bit worse in regard to her wound. The entirety of the wound is dry and unfortunately the Medihoney does not seem to be helping this. That is even with the dressing changes that I made last week to try to retain more moisture. She has not tried [employer] as of yet. She was also noncompressible when testing for ABIs. 07/01/2017 -- she had a arterial studies done and was seen by Dr. Lucky Cowboy. her noninvasive studies showed noncompressible vessels on the right but brisk waveforms and digital pressures of 71 on the right consistent with only mild arterial insufficienc. On the left her ABI was 0.47 and this may be falsely elevated due to calcification. No digital pressures were obtained on the left and this is consistent with severe arterial insufficiency. this critical limb threatening situation on the left, would make wound healing difficult and it represents a serious situation and he has recommended an angiography with possible revascularization. The patient desired to defer the decision to she discuss with the family members. 07/09/17 on evaluation today patient appears to be doing fairly well in regard to her left anterior lower extremity wound. She has been tolerating the dressing changes without complication we have been utilizing Santyl at this time. She tells me that she is having no significant pain compared to what she has had in the past. She has decided after discussing with family that she is going to go forward with the surgery with Dr. dew. This is to  restore better blood flow to left lower extremity. With that it does appear that her lower extremity wound is making some progress albeit slow. No fevers, chills, nausea, or vomiting noted at this [facility], Indria P. ([id number]) time. 07/16/17 on evaluation today patient's wound appears to be doing roughly about the same although some of the slough is clearing off. [name] she has her appointment with vascular next week on Thursday the 23rd. With that being said her wound does not appear to be hurting as badly as it has in the past which is good news. No fevers, chills, nausea, or vomiting noted at this time. 07/30/2017 -- she had surgery on 07/22/2017 --indications being nonhealing ulcer on the left leg with a noninvasive study showing marked reduction in ABI of less than 0.5 and no digital pressure on her left leg. she had a percutaneous transluminal angioplasty of the left common and external iliac arteries. She then had a stent placed to the left external iliac arteries and to the left common iliac artery. 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 The flow distally was poor and there were multiple areas of high-grade stenosis or occlusion of the distal SFA and the popliteal artery with only a diseased peroneal artery as runoff distally. She has a postop appointment on September 23 08/20/2017 -- she went back to the vascular office for an appointment on 08/12/2017 and bilateral ABIs were done which were notable for moderate right lower extremity arterial disease and unable to obtain left ankle brachial indices due to absent Doppler velocities in the posterior tibial artery and the anterior tibial artery. Monophasic waveforms to the left femoral and popliteal artery. this was worrisome for a failure of stents placed and lower extremity angiogram was recommended. as was done on 08/16/2017 and the stents placed  in the left eye Lex system had some mild narrowing at the  iliac bifurcation and the leading edge of the stent proximally was poor wall apposition with stenosis in the 50-60% range in the right common iliac artery. There was also occlusion of the common femoral artery and the origin of the SFA and profunda femoris artery. Flow distally was poor but they appeared to be multiple areas of high-grade stenosis or occlusion in the distal SFA and popliteal artery with only a diseased peroneal artery. in another stent was placed and deployed. The femoral occlusion would need to be treated surgically. 08/27/17 on evaluation today patient appears to be doing about the same in regard to her left for extremity wound. Unfortunately secondary to her vascular status this is not a very well healing wound at this time. She has been tolerating the dressing changes she has some discomfort but mainly with cleansing of the wound not at other times. No fevers, chills, nausea, or vomiting noted at this time. Electronic Signature(s) Signed: 08/27/2017 5:13:37 PM By: Worthy Keeler PA-C Entered By: Worthy Keeler on 08/27/2017 16:26:01 Huxtable, [name] ([id number]) -------------------------------------------------------------------------------- Physical Exam Details Patient Name: [name] P. Date of Service: 08/27/2017 3:30 PM Medical Record Number: [id number] Patient Account Number: [id number] Date of Birth/Sex: 11/10/1926 (83 y.o. Female) Treating RN: Cornell Barman Primary Care Provider: Lucianne Lei Other Clinician: Referring Provider: Lucianne Lei Treating Provider/Extender: Melburn Hake, Carleta Woodrow Weeks in Treatment: 67 Constitutional Well-nourished and well-hydrated in no acute distress. Respiratory normal breathing without difficulty. clear to auscultation bilaterally. Cardiovascular regular rate and rhythm with normal S1, S2. Psychiatric this patient is able to make decisions and demonstrates good  insight into disease process. Alert and Oriented x 3. pleasant and cooperative. Notes There does not appear to be any infection evident in the periwound location at this time. Again no debridement was performed due to patient's vascular status. Electronic Signature(s) Signed: 08/27/2017 5:13:37 PM By: Worthy Keeler PA-C Entered By: Worthy Keeler on 08/27/2017 16:26:27 [name] ([id number]) -------------------------------------------------------------------------------- Physician Orders Details Patient Name: [name] P. Date of Service: 08/27/2017 3:30 PM Medical Record Number: [id number] Patient Account Number: [id number] Date of Birth/Sex: [birth year] (83 y.o. 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 o May Shower, gently pat wound dry prior to applying new dressing. Anesthetic Wound #1 Left,Anterior Lower Leg o Topical Lidocaine 4% cream applied to wound bed prior to debridement Skin Barriers/Peri-Wound Care Wound #1 Left,Anterior Lower Leg o Skin Prep Primary Wound Dressing Wound #1 Left,Anterior Lower Leg o Santyl Ointment Secondary Dressing Wound #1 Left,Anterior Lower Leg o Dry Gauze o Boardered Foam Dressing o Other - stretch netting #4 Dressing Change Frequency Wound #1 Left,Anterior Lower Leg o Change dressing every day. Follow-up Appointments CHEREE, FOWLES ([id number]) Wound #1 Left,Anterior Lower Leg o Return  Appointment in 1 week. Edema Control Wound #1 Left,Anterior Lower Leg o Elevate legs to the level of the heart and pump ankles as often as possible Additional Orders / Instructions Wound #1 Left,Anterior Lower Leg o Increase protein intake. o Other: - Please add vitamin A, vitamin C and zinc supplements to your diet  Electronic Signature(s) Signed: 08/27/2017 4:29:39 PM By: Gretta Cool, BSN, RN, CWS, Kim RN, BSN Signed: 08/27/2017 5:13:37 PM By: Worthy Keeler PA-C Entered By: Gretta Cool, BSN, RN, CWS, Kim on 08/27/2017 16:23:13 Kabel, [name] ([id number]) -------------------------------------------------------------------------------- Problem List Details Patient Name: DANITY, SCHMELZER P. Date of Service: 08/27/2017 3:30 PM Medical Record Number: [id number] Patient Account Number: [id number] Date of Birth/Sex: 02-Nov-1926 (83 y.o. 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 ([id number]) -------------------------------------------------------------------------------- Progress Note Details Patient Name: [name] P. Date of Service: 08/27/2017 3:30 PM Medical Record Number: [id number] Patient Account Number: [id number] Date of Birth/Sex: 1926/05/23 (83 y.o. Female) Treating RN: Cornell Barman Primary Care Provider: Lucianne Lei Other  Clinician: Referring Provider: Lucianne Lei Treating Provider/Extender: Melburn Hake, Jamy Cleckler Weeks in Treatment: 13 Subjective Chief Complaint Information obtained from Patient Patients presents for treatment of an open diabetic ulcer 2 the left lower anterior leg which she sustained an injury by blunt trauma History of Present Illness (HPI) The following HPI elements were documented for the patient's wound: Location: left anterior shin Quality: Patient reports experiencing a dull pain to affected area(s). Severity: Patient states wound are getting worse. Duration: Patient has had the wound for < 3 weeks prior to presenting for treatment Timing: Pain in wound is Intermittent (comes and goes Context: The wound occurred when the patient blunt trauma against her left shin Modifying Factors: Other treatment(s) tried include:active Bactroban ointment locally Associated Signs and Symptoms: Patient reports having: occasional discharge from the wound 83 year old patient was seen in the ED about 10 days ago for a history of abrasion to the left lower extremity while she was boarding a bus and scraped the anterior part of her left leg. She has been a diabetic for many years and has been taking treatment regularly. Her past medical history is also significant for anemia arthritis constipation and hypertension and is status post abdominal hysterectomy. She has never been a smoker. after her ER visit she was advised to apply Bactroban ointment to the wound twice a day and continue to monitor her blood glucose levels. her last hemoglobin A1c was done 3 years ago and was 6.6% 06/04/17 patient left anterior shin wound appears to be doing well although it is still somewhat dry despite the treatment with Medihoney. We have been using Kerlex following the Medihoney application and I believe this is just not retaining that much moisture. Nonetheless there is no evidence of infection. 06/11/17 on evaluation today  patient appears to be doing a little bit worse in regard to her wound. The entirety of the wound is dry and unfortunately the Medihoney does not seem to be helping this. That is even with the dressing changes that I made last week to try to retain more moisture. She has not tried [employer] as of yet. She was also noncompressible when testing for ABIs. 07/01/2017 -- she had a arterial studies done and was seen by Dr. Lucky Cowboy. her noninvasive studies showed noncompressible vessels on the right but  brisk waveforms and digital pressures of 71 on the right consistent with only mild arterial insufficienc. On the left her ABI was 0.47 and this may be falsely elevated due to calcification. No digital pressures were obtained on the left and this is consistent with severe arterial insufficiency. this critical limb threatening situation on the left, would make wound healing difficult and it Marinello, Kellsie P. ([id number]) represents a serious situation and he has recommended an angiography with possible revascularization. The patient desired to defer the decision to she discuss with the family members. 07/09/17 on evaluation today patient appears to be doing fairly well in regard to her left anterior lower extremity wound. She has been tolerating the dressing changes without complication we have been utilizing Santyl at this time. She tells me that she is having no significant pain compared to what she has had in the past. She has decided after discussing with family that she is going to go forward with the surgery with Dr. dew. This is to restore better blood flow to left lower extremity. With that it does appear that her lower extremity wound is making some progress albeit slow. No fevers, chills, nausea, or vomiting noted at this time. 07/16/17 on evaluation today patient's wound appears to be doing roughly about the same although some of the slough is clearing off. [name] she has her appointment with vascular  next week on Thursday the 23rd. With that being said her wound does not appear to be hurting as badly as it has in the past which is good news. No fevers, chills, nausea, or vomiting noted at this time. 07/30/2017 -- she had surgery on 07/22/2017 --indications being nonhealing ulcer on the left leg with a noninvasive study showing marked reduction in ABI of less than 0.5 and no digital pressure on her left leg. she had a percutaneous transluminal angioplasty of the left common and external iliac arteries. She then had a stent placed to the left external iliac arteries and to the left common iliac artery. 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 The flow distally was poor and there were multiple areas of high-grade stenosis or occlusion of the distal SFA and the popliteal artery with only a diseased peroneal artery as runoff distally. She has a postop appointment on September 23 08/20/2017 -- she went back to the vascular office for an appointment on 08/12/2017 and bilateral ABIs were done which were notable for moderate right lower extremity arterial disease and unable to obtain left ankle brachial indices due to absent Doppler velocities in the posterior tibial artery and the anterior tibial artery. Monophasic waveforms to the left femoral and popliteal artery. this was worrisome for a failure of stents placed and lower extremity angiogram was recommended. as was done on 08/16/2017 and the stents placed in the left eye Lex system had some mild narrowing at the iliac bifurcation and the leading edge of the stent proximally was poor wall apposition with stenosis in the 50-60% range in the right common iliac artery. There was also occlusion of the common femoral artery and the origin of the SFA and profunda femoris artery. Flow distally was poor but they appeared to be multiple areas of high-grade stenosis or  occlusion in the distal SFA and popliteal artery with only a diseased peroneal artery. in another stent was placed and deployed. The femoral occlusion would need to be treated surgically. 08/27/17 on evaluation today  patient appears to be doing about the same in regard to her left for extremity wound. Unfortunately secondary to her vascular status this is not a very well healing wound at this time. She has been tolerating the dressing changes she has some discomfort but mainly with cleansing of the wound not at other times. No fevers, chills, nausea, or vomiting noted at this time. Szumski, Jojo P. ([id number]) Objective Constitutional Well-nourished and well-hydrated in no acute distress. Vitals Time Taken: 3:49 PM, Height: 65 in, Weight: 131 lbs, BMI: 21.8, Temperature: 97.7 F, Pulse: 93 bpm, Respiratory Rate: 16 breaths/min, Blood Pressure: 161/68 mmHg. Respiratory normal breathing without difficulty. clear to auscultation bilaterally. Cardiovascular regular rate and rhythm with normal S1, S2. Psychiatric this patient is able to make decisions and demonstrates good insight into disease process. Alert and Oriented x 3. pleasant and cooperative. General Notes: There does not appear to be any infection evident in the periwound location at this time. Again no debridement was performed due to patient's vascular status. Integumentary (Hair, Skin) Wound #1 status is Open. Original cause of wound was Trauma. The wound is located on the Left,Anterior Lower Leg. The wound measures 7.1cm length x 2.5cm width x 0.2cm depth; 13.941cm^2 area and 2.788cm^3 volume. There is Fat Layer (Subcutaneous Tissue) Exposed exposed. There is no tunneling or undermining noted. There is a large amount of serous drainage noted. The wound margin is flat and intact. There is no granulation within the wound bed. There is a large (67-100%) amount of necrotic tissue within the wound bed including Adherent Slough. The  periwound skin appearance exhibited: Scarring, Hemosiderin Staining, Erythema. The periwound skin appearance did not exhibit: Callus, Crepitus, Excoriation, Induration, Rash, Dry/Scaly, Maceration, Atrophie Blanche, Cyanosis, Ecchymosis, Mottled, Pallor, Rubor. The surrounding wound skin color is noted with erythema which is circumferential. Periwound temperature was noted as No Abnormality. The periwound has tenderness on palpation. 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 ([id number]) Procedures Wound #1 Pre-procedure diagnosis of Wound #1 is a Diabetic Wound/Ulcer of the Lower Extremity located on the Left,Anterior Lower Leg .Severity of Tissue Pre Debridement is: Fat layer exposed. There was a Non- Selective Chemical/enzymatic debridement (non-viable tissue was removed) performed by STONE III, Breland Trouten E., PA-C.. A time out was conducted at 16:15, prior to the start of the procedure. The procedure was tolerated well with a pain level of 0 throughout and a pain level of 0 following the procedure. Post Debridement Measurements: 7.1cm length x 2.5cm width x 0.1cm depth; 1.394cm^3 volume. Character of Wound/Ulcer Post Debridement is stable. Severity of Tissue Post Debridement is: Fat layer exposed. Post procedure Diagnosis Wound #1: Same as Pre-Procedure Plan Wound Cleansing: Wound #1 Left,Anterior Lower Leg: Clean wound with Normal Saline. May Shower, gently pat wound dry prior to applying new dressing. Anesthetic: Wound #1 Left,Anterior Lower Leg: Topical Lidocaine 4% cream applied to wound bed prior to debridement Skin Barriers/Peri-Wound Care: Wound #1 Left,Anterior Lower Leg: Skin Prep Primary Wound Dressing: Wound #1 Left,Anterior Lower Leg: Santyl  Ointment Secondary Dressing: Wound #1 Left,Anterior Lower Leg: Dry Gauze Boardered Foam Dressing Other - stretch netting #4 Dressing Change Frequency: Wound #1 Left,Anterior Lower Leg: Change dressing every day. Follow-up Appointments: Wound #1 Left,Anterior Lower Leg: Return Appointment in 1 week. Edema Control: Wound #1 Left,Anterior Lower Leg: Elevate legs  to the level of the heart and pump ankles as often as possible Tory, Labrittany P. ([id number]) Additional Orders / Instructions: Wound #1 Left,Anterior Lower Leg: Increase protein intake. Other: - Please add vitamin A, vitamin C and zinc supplements to your diet I'm going to recommend that we continue with the Current wound care measures please see above for specifics. Patient also has a fault appointment scheduled the near future with vascular to discuss further options for management of her vascular flow in the left lower extremity. We will see her for reevaluation in two weeks time. If anything worsens in the interim she will contact our office for additional recommendations. Electronic Signature(s) Signed: 08/27/2017 5:13:37 PM By: Worthy Keeler PA-C Entered By: Worthy Keeler on 08/27/2017 16:27:30 [place], [name] ([id number]) -------------------------------------------------------------------------------- SuperBill Details Patient Name: [name] P. Date of Service: 08/27/2017 Medical Record Number: [id number] Patient Account Number: [id number] Date of Birth/Sex: 13-Aug-1926 (83 y.o. 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

## 2017-08-29 NOTE — Progress Notes (Signed)
[name] ([id number]) Visit Report for 08/27/2017 Arrival Information Details Patient Name: [name], [name] Date of Service: 08/27/2017 3:30 PM Medical Record Number: [id number] Patient Account Number: [id number] Date of Birth/Sex: 05-08-1926 (83 y.o. Female) Treating RN: Cornell Barman Primary Care Ridhaan Dreibelbis: Lucianne Lei Other Clinician: Referring Kyleen Villatoro: Lucianne Lei Treating Lyndee Herbst/Extender: Melburn Hake, HOYT Weeks in Treatment: 13 Visit Information History Since Last Visit Added or deleted any medications: No Patient Arrived: Wheel Chair Any new allergies or adverse reactions: No Arrival Time: 15:48 Had a fall or experienced change in No activities of daily living that may affect Accompanied By: son risk of falls: Transfer Assistance: Manual Signs or symptoms of abuse/neglect since last No Patient Identification Verified: Yes visito Secondary Verification Process Yes Hospitalized since last visit: No Completed: Has Dressing in Place as Prescribed: Yes Patient Requires Transmission-Based No Pain Present Now: No Precautions: Patient Has Alerts: Yes Electronic Signature(s) Signed: 08/27/2017 4:29:39 PM By: Gretta Cool, BSN, RN, CWS, Kim RN, BSN Entered By: Gretta Cool, BSN, RN, CWS, Kim on 08/27/2017 15:49:15 [place], [name] ([id number]) -------------------------------------------------------------------------------- Encounter Discharge Information Details Patient Name: [name] P. Date of Service: 08/27/2017 3:30 PM Medical Record Number: [id number] Patient Account Number: [id number] Date of Birth/Sex: 01/14/1926 (83 y.o. Female) Treating RN: Cornell Barman Primary Care Esterlene Atiyeh: Lucianne Lei Other Clinician: Referring Viraj Liby: Lucianne Lei Treating Keasha Malkiewicz/Extender: Melburn Hake, HOYT Weeks in Treatment: 13 Encounter Discharge Information Items Discharge Pain Level: 0 Discharge Condition: Stable Ambulatory Status: Wheelchair Discharge Destination:  Home Transportation: Private Auto Accompanied By: son Schedule Follow-up Appointment: Yes Medication Reconciliation completed and provided to Patient/Care Yes Saajan Willmon: Provided on Clinical Summary of Care: 08/27/2017 Form Type Recipient Paper Patient GW Electronic Signature(s) Signed: 08/27/2017 4:29:39 PM By: Gretta Cool, BSN, RN, CWS, Kim RN, BSN Entered By: Gretta Cool, BSN, RN, CWS, Kim on 08/27/2017 16:25:51 [place]le, [name] ([id number]) -------------------------------------------------------------------------------- Lower Extremity Assessment Details Patient Name: [name] P. Date of Service: 08/27/2017 3:30 PM Medical Record Number: [id number] Patient Account Number: [id number] Date of Birth/Sex: 09/02/1926 (83 y.o. Female) Treating RN: Cornell Barman Primary Care Lynk Marti: Lucianne Lei Other Clinician: Referring Marquan Vokes: Lucianne Lei Treating Ka Bench/Extender: Melburn Hake, HOYT Weeks in Treatment: 13 Edema Assessment Assessed: [Left: No] [Right: No] Edema: [Left: N] [Right: o] Vascular Assessment Pulses: Dorsalis Pedis Palpable: [Left:No] Doppler Audible: [Left:Yes] Posterior Tibial Palpable: [Left:No] Doppler Audible: [Left:Yes] Extremity colors, hair growth, and conditions: Extremity Color: [Left:Hyperpigmented] Hair Growth on Extremity: [Left:No] Temperature of Extremity: [Left:Warm] Capillary Refill: [Left:> 3 seconds] Dependent Rubor: [Left:No] Blanched when Elevated: [Left:No] Lipodermatosclerosis: [Left:No] Toe Nail Assessment Left: Right: Thick: No Discolored: No Deformed: No Improper Length and Hygiene: No Electronic Signature(s) Signed: 08/27/2017 4:29:39 PM By: Gretta Cool, BSN, RN, CWS, Kim RN, BSN Entered By: Gretta Cool, BSN, RN, CWS, Kim on 08/27/2017 16:00:00 [place], [name] ([id number]) -------------------------------------------------------------------------------- Multi Wound Chart Details Patient Name: [name] P. Date of Service:  08/27/2017 3:30 PM Medical Record Number: [id number] Patient Account Number: [id number] Date of Birth/Sex: 21-May-1926 (83 y.o. Female) Treating RN: Cornell Barman Primary Care Laretta Pyatt: Lucianne Lei Other Clinician: Referring Antwan Bribiesca: Lucianne Lei Treating Waldemar Siegel/Extender: Melburn Hake, HOYT Weeks in Treatment: 13 Vital Signs Height(in): 65 Pulse(bpm): 93 Weight(lbs): 131 Blood Pressure 161/68 (mmHg): Body Mass Index(BMI): 22 Temperature(F): 97.7 Respiratory Rate 16 (breaths/min): Photos: [N/A:N/A] Wound Location: Left Lower Leg - Anterior N/A N/A Wounding Event: Trauma N/A N/A Primary Etiology: Diabetic Wound/Ulcer of N/A N/A the Lower Extremity Secondary Etiology: Trauma, Other N/A N/A Comorbid History: Anemia, Hypertension, N/A N/A Type II Diabetes, Osteoarthritis Date Acquired: 05/02/2017 N/A N/A  Weeks of Treatment: 13 N/A N/A Wound Status: Open N/A N/A Measurements L x W x D 7.1x2.5x0.2 N/A N/A (cm) Area (cm) : 13.941 N/A N/A Volume (cm) : 2.788 N/A N/A % Reduction in Area: -190.00% N/A N/A % Reduction in Volume: -190.10% N/A N/A Classification: Grade 2 N/A N/A Exudate Amount: Large N/A N/A Exudate Type: Serous N/A N/A Exudate Color: amber N/A N/A Wound Margin: Flat and Intact N/A N/A Granulation Amount: None Present (0%) N/A N/A Necrotic Amount: Large (67-100%) N/A N/A Folse, Rigby P. ([id number]) Exposed Structures: Fat Layer (Subcutaneous N/A N/A Tissue) Exposed: Yes Fascia: No Tendon: No Muscle: No Joint: No Bone: No Epithelialization: None N/A N/A Periwound Skin Texture: Scarring: Yes N/A N/A Excoriation: No Induration: No Callus: No Crepitus: No Rash: No Periwound Skin Maceration: No N/A N/A Moisture: Dry/Scaly: No Periwound Skin Color: Erythema: Yes N/A N/A Hemosiderin Staining: Yes Atrophie Blanche: No Cyanosis: No Ecchymosis: No Mottled: No Pallor: No Rubor: No Erythema Location: Circumferential N/A N/A Temperature: No Abnormality  N/A N/A Tenderness on Yes N/A N/A Palpation: Wound Preparation: Ulcer Cleansing: N/A N/A Rinsed/Irrigated with Saline Topical Anesthetic Applied: Other: lidocaine 4% Treatment Notes Electronic Signature(s) Signed: 08/27/2017 4:09:35 PM By: Gretta Cool, BSN, RN, CWS, Kim RN, BSN Entered By: Gretta Cool, BSN, RN, CWS, Kim on 08/27/2017 16:09:35 Kerri Perches, [name] ([id number]) -------------------------------------------------------------------------------- Multi-Disciplinary Care Plan Details Patient Name: [name] P. Date of Service: 08/27/2017 3:30 PM Medical Record Number: [id number] Patient Account Number: [id number] Date of Birth/Sex: 1926-06-17 (83 y.o. Female) Treating RN: Cornell Barman Primary Care Augustine Leverette: Lucianne Lei Other Clinician: Referring Malicia Blasdel: Lucianne Lei Treating Rody Keadle/Extender: Melburn Hake, HOYT Weeks in Treatment: 13 Active Inactive ` Abuse / Safety / Falls / Self Care Management Nursing Diagnoses: Potential for falls Goals: Patient will remain injury free related to falls Date Initiated: 05/28/2017 Target Resolution Date: 07/23/2017 Goal Status: Active Interventions: Assess fall risk on admission and as needed Notes: ` Orientation to the Wound Care Program Nursing Diagnoses: Knowledge deficit related to the wound healing center program Goals: Patient/caregiver will verbalize understanding of the [facility] Date Initiated: 05/28/2017 Target Resolution Date: 07/23/2017 Goal Status: Active Interventions: Provide education on orientation to the wound center Notes: ` Wound/Skin Impairment Nursing Diagnoses: Impaired tissue integrity Doeden, Dameshia P. ([id number]) Goals: Ulcer/skin breakdown will have a volume reduction of 30% by week 4 Date Initiated: 05/28/2017 Target Resolution Date: 07/23/2017 Goal Status: Active Ulcer/skin breakdown will have a volume reduction of 50% by week 8 Date Initiated: 05/28/2017 Target Resolution  Date: 07/23/2017 Goal Status: Active Ulcer/skin breakdown will have a volume reduction of 80% by week 12 Date Initiated: 05/28/2017 Target Resolution Date: 07/23/2017 Goal Status: Active Ulcer/skin breakdown will heal within 14 weeks Date Initiated: 05/28/2017 Target Resolution Date: 07/23/2017 Goal Status: Active Interventions: Assess patient/caregiver ability to obtain necessary supplies Assess patient/caregiver ability to perform ulcer/skin care regimen upon admission and as needed Assess ulceration(s) every visit Notes: Electronic Signature(s) Signed: 08/27/2017 4:09:27 PM By: Gretta Cool, BSN, RN, CWS, Kim RN, BSN Entered By: Gretta Cool, BSN, RN, CWS, Kim on 08/27/2017 16:09:27 [place], [name] ([id number]) -------------------------------------------------------------------------------- Pain Assessment Details Patient Name: [name] P. Date of Service: 08/27/2017 3:30 PM Medical Record Number: [id number] Patient Account Number: [id number] Date of Birth/Sex: 01/07/1926 (83 y.o. Female) Treating RN: Cornell Barman Primary Care Shawndell Schillaci: Lucianne Lei Other Clinician: Referring Rickey Sadowski: Lucianne Lei Treating Angel Weedon/Extender: Melburn Hake, HOYT Weeks in Treatment: 13 Active Problems Location of Pain Severity and Description of Pain Patient Has Paino Yes Site Locations Pain Location: Pain  in Ulcers With Dressing Change: Yes Duration of the Pain. Constant / Intermittento Intermittent Rate the pain. Current Pain Level: 4 Character of Pain Describe the Pain: Sharp, Shooting Pain Management and Medication Current Pain Management: Goals for Pain Management Topical or injectable lidocaine is offered to patient for acute pain when surgical debridement is performed. If needed, Patient is instructed to use over the counter pain medication for the following 24-48 hours after debridement. Wound care MDs do not prescribed pain medications. Patient has chronic pain or uncontrolled pain.  Patient has been instructed to make an appointment with their Primary Care Physician for pain management. Electronic Signature(s) Signed: 08/27/2017 4:29:39 PM By: Gretta Cool, BSN, RN, CWS, Kim RN, BSN Entered By: Gretta Cool, BSN, RN, CWS, Kim on 08/27/2017 15:49:38 Kerri Perches, [name] ([id number]) -------------------------------------------------------------------------------- Patient/Caregiver Education Details Patient Name: [name] P. Date of Service: 08/27/2017 3:30 PM Medical Record Number: [id number] Patient Account Number: [id number] Date of Birth/Gender: August 01, 1926 (83 y.o. Female) Treating RN: Cornell Barman Primary Care Physician: Lucianne Lei Other Clinician: Referring Physician: Lucianne Lei Treating Physician/Extender: Sharalyn Ink in Treatment: 13 Education Assessment Education Provided To: Patient Education Topics Provided Wound/Skin Impairment: Handouts: Caring for Your Ulcer Methods: Demonstration Responses: State content correctly Electronic Signature(s) Signed: 08/27/2017 4:29:39 PM By: Gretta Cool, BSN, RN, CWS, Kim RN, BSN Entered By: Gretta Cool, BSN, RN, CWS, Kim on 08/27/2017 16:26:01 Adachi, [name] ([id number]) -------------------------------------------------------------------------------- Wound Assessment Details Patient Name: [name] P. Date of Service: 08/27/2017 3:30 PM Medical Record Number: [id number] Patient Account Number: [id number] Date of Birth/Sex: 03/26/1926 (83 y.o. Female) Treating RN: Cornell Barman Primary Care Mercer Peifer: Lucianne Lei Other Clinician: Referring Race Latour: Lucianne Lei Treating Ashana Tullo/Extender: Melburn Hake, HOYT Weeks in Treatment: 13 Wound Status Wound Number: 1 Primary Diabetic Wound/Ulcer of the Lower Etiology: Extremity Wound Location: Left Lower Leg - Anterior Secondary Trauma, Other Wounding Event: Trauma Etiology: Date Acquired: 05/02/2017 Wound Status: Open Weeks Of Treatment: 13 Comorbid Anemia,  Hypertension, Type II Clustered Wound: No History: Diabetes, Osteoarthritis Photos Wound Measurements Length: (cm) 7.1 Width: (cm) 2.5 Depth: (cm) 0.2 Area: (cm) 13.941 Volume: (cm) 2.788 % Reduction in Area: -190% % Reduction in Volume: -190.1% Epithelialization: None Tunneling: No Undermining: No Wound Description Classification: Grade 2 Wound Margin: Flat and Intact Exudate Amount: Large Exudate Type: Serous Exudate Color: amber Foul Odor After Cleansing: No Slough/Fibrino Yes Wound Bed Granulation Amount: None Present (0%) Exposed Structure Necrotic Amount: Large (67-100%) Fascia Exposed: No Necrotic Quality: Adherent Slough Fat Layer (Subcutaneous Tissue) Exposed: Yes Tendon Exposed: No Muscle Exposed: No Joint Exposed: No Bone Exposed: No Abercrombie, Viana P. ([id number]) Periwound Skin Texture Texture Color No Abnormalities Noted: No No Abnormalities Noted: No Callus: No Atrophie Blanche: No Crepitus: No Cyanosis: No Excoriation: No Ecchymosis: No Induration: No Erythema: Yes Rash: No Erythema Location: Circumferential Scarring: Yes Hemosiderin Staining: Yes Mottled: No Moisture Pallor: No No Abnormalities Noted: No Rubor: No Dry / Scaly: No Maceration: No Temperature / Pain Temperature: No Abnormality Tenderness on Palpation: Yes Wound Preparation Ulcer Cleansing: Rinsed/Irrigated with Saline Topical Anesthetic Applied: Other: lidocaine 4%, Treatment Notes Wound #1 (Left, Anterior Lower Leg) 1. Cleansed with: Clean wound with Normal Saline 2. Anesthetic Topical Lidocaine 4% cream to wound bed prior to debridement 4. Dressing Applied: Santyl Ointment 5. 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 Date of Service: 08/27/2017 3:30 PM Medical Record Number: [id number] Patient Account Number: [id number] Date of Birth/Sex: 16-Sep-1926 (83 y.o. Female) Treating RN: Cornell Barman Primary Care Jorey Dollard: Lucianne Lei Other Clinician: Referring Rustin Erhart: Lucianne Lei Treating Atley Scarboro/Extender: Melburn Hake, HOYT Weeks in Treatment: 13 Vital Signs Time Taken: 15:49 Temperature (F): 97.7 Height (in): 65 Pulse (bpm): 93 Weight (lbs): 131 Respiratory Rate (breaths/min): 16 Body Mass Index (BMI): 21.8 Blood Pressure (mmHg): 161/68 Reference Range: 80 - 120 mg / dl Electronic Signature(s) Signed: 08/27/2017 4:29:39 PM By: Gretta Cool, BSN, RN, CWS, Kim RN, BSN Entered By: Gretta Cool, BSN, RN, CWS, Kim on 08/27/2017 15:50:13

## 2017-09-02 ENCOUNTER — Ambulatory Visit
Admission: RE | Admit: 2017-09-02 | Discharge: 2017-09-02 | Disposition: A | Discharge status: Home / Self Care | Payer: Medicare Other | Source: Ambulatory Visit | Attending: Vascular Surgery | Admitting: Vascular Surgery

## 2017-09-02 DIAGNOSIS — I639 Cerebral infarction, unspecified: Secondary | ICD-10-CM | POA: Diagnosis not present

## 2017-09-02 DIAGNOSIS — Z8673 Personal history of transient ischemic attack (TIA), and cerebral infarction without residual deficits: Secondary | ICD-10-CM | POA: Insufficient documentation

## 2017-09-02 DIAGNOSIS — Z01812 Encounter for preprocedural laboratory examination: Secondary | ICD-10-CM | POA: Diagnosis present

## 2017-09-02 DIAGNOSIS — Z01810 Encounter for preprocedural cardiovascular examination: Secondary | ICD-10-CM | POA: Insufficient documentation

## 2017-09-02 HISTORY — DX: Peripheral vascular disease, unspecified: I73.9

## 2017-09-02 LAB — CBC WITH DIFFERENTIAL/PLATELET
BASOS PCT: 0 %
Basophils Absolute: 0 10*3/uL (ref 0–0.1)
EOS ABS: 0.1 10*3/uL (ref 0–0.7)
Eosinophils Relative: 2 %
HCT: 31.2 % — ABNORMAL LOW (ref 35.0–47.0)
HEMOGLOBIN: 10.5 g/dL — AB (ref 12.0–16.0)
Lymphocytes Relative: 27 %
Lymphs Abs: 1.6 10*3/uL (ref 1.0–3.6)
MCH: 26.6 pg (ref 26.0–34.0)
MCHC: 33.6 g/dL (ref 32.0–36.0)
MCV: 79.3 fL — ABNORMAL LOW (ref 80.0–100.0)
MONOS PCT: 10 %
Monocytes Absolute: 0.6 10*3/uL (ref 0.2–0.9)
NEUTROS PCT: 61 %
Neutro Abs: 3.7 10*3/uL (ref 1.4–6.5)
Platelets: 428 10*3/uL (ref 150–440)
RBC: 3.94 MIL/uL (ref 3.80–5.20)
RDW: 17 % — ABNORMAL HIGH (ref 11.5–14.5)
WBC: 6 10*3/uL (ref 3.6–11.0)

## 2017-09-02 LAB — BASIC METABOLIC PANEL
Anion gap: 12 (ref 5–15)
BUN: 19 mg/dL (ref 6–20)
CALCIUM: 9.6 mg/dL (ref 8.9–10.3)
CO2: 25 mmol/L (ref 22–32)
CREATININE: 1.01 mg/dL — AB (ref 0.44–1.00)
Chloride: 104 mmol/L (ref 101–111)
GFR, EST AFRICAN AMERICAN: 55 mL/min — AB (ref >60)
GFR, EST NON AFRICAN AMERICAN: 48 mL/min — AB (ref >60)
Glucose, Bld: 96 mg/dL (ref 65–99)
Potassium: 3.9 mmol/L (ref 3.5–5.1)
SODIUM: 141 mmol/L (ref 135–145)

## 2017-09-02 LAB — APTT: aPTT: 29 seconds (ref 24–36)

## 2017-09-02 LAB — TYPE AND SCREEN
ABO/RH(D): B POS
Antibody Screen: NEGATIVE

## 2017-09-02 LAB — PROTIME-INR
INR: 1.05
PROTHROMBIN TIME: 13.6 s (ref 11.4–15.2)

## 2017-09-02 LAB — SURGICAL PCR SCREEN
MRSA, PCR: NEGATIVE
Staphylococcus aureus: NEGATIVE

## 2017-09-02 NOTE — Patient Instructions (Addendum)
Your procedure is scheduled on: September 08, 2017 [facility] ) Report to Same Day Surgery Surgery Information Desk ([facility]) To find out your arrival time please call [phone] between 1PM - 3PM on September 07, 2017 [facility] )  Remember: Instructions that are not followed completely may result in serious medical risk, up to and including death, or upon the discretion of your surgeon and anesthesiologist your surgery may need to be rescheduled.     _X__ 1. Do not eat food after midnight the night before your procedure.                 No gum chewing or hard candies. You may drink clear liquids up to 2 hours                 before you are scheduled to arrive for your surgery- DO not drink clear                 liquids within 2 hours of the start of your surgery.                 Clear Liquids include:  water, apple juice without pulp, clear carbohydrate                 drink such as Clearfast of Gartorade, Black Coffee or Tea (Do not add                 anything to coffee or tea).     _X__ 2.  No Alcohol for 24 hours before or after surgery.   _X__ 3.  Do Not Smoke or use e-cigarettes For 24 Hours Prior to Your Surgery.                 Do not use any chewable tobacco products for at least 6 hours prior to                 surgery.  ____  4.  Bring all medications with you on the day of surgery if instructed.   _X___  5.  Notify your doctor if there is any change in your medical condition      (cold, fever, infections).     Do not wear jewelry, make-up, hairpins, clips or nail polish. Do not wear lotions, powders, or perfumes.  Do not shave 48 hours prior to surgery. Men may shave face and neck. Do not bring valuables to the hospital.    [facility] is not responsible for any belongings or valuables.  Contacts, dentures or bridgework may not be worn into surgery. Leave your suitcase in the car. After surgery it may be brought to your room. For  patients admitted to the hospital, discharge time is determined by your treatment team.   Patients discharged the day of surgery will not be allowed to drive home.   Please read over the following fact sheets that you were given:   MRSA Information          __X__ Take these medicines the morning of surgery with A SIP OF WATER:    1. AMLODIPINE  2. LIPITOR  3. PANTOPRAZOLE  4. PANTOPRAZOLE AT BEDTIME ON TUESDAY NIGHT (OCTOBER  9 )  5.  6.  ____ Fleet Enema (as directed)   _X___ Use CHG Soap as directed (SAGE WIPES GIVEN TO PATIENT WITH INSTRUCTIONS )   ____ Use inhalers on the day of surgery  _X___ Stop metformin 2 days prior to surgery (  STOP METFORMIN ON OCTIBER 8 )   ____ Take 1/2 of usual insulin dose the night before surgery. No insulin the morning          of surgery.   _X___ Stop Coumadin/Plavix/aspirin on (STOP PLAVIX NOW, MAY CONTINUE ASPIRIN, BUT DO NOT TAKE ASPIRIN THE MORNING OF SURGERY )  ____ Stop Anti-inflammatories on (NO NSAIDS )   ____ Stop supplements until after surgery.    ____ Bring C-Pap to the hospital.

## 2017-09-03 NOTE — Pre-Procedure Instructions (Signed)
CBC results sent to Dr. Lucky Cowboy and Anesthesia for review.

## 2017-09-06 ENCOUNTER — Telehealth (INDEPENDENT_AMBULATORY_CARE_PROVIDER_SITE_OTHER): Payer: Self-pay

## 2017-09-06 NOTE — Telephone Encounter (Signed)
Patient's granddaughter left a message stating that the patient stopped taking her Plavix on Saturday and her lab work showed she is anemic, this per her PCP. The granddaughter is concerned about this. Her surgery is on 09/08/17 a femoral endarterectomy.

## 2017-09-06 NOTE — Telephone Encounter (Signed)
Spoke with the granddaughter and gave her Dr. Bunnie Domino recommendations and she felt that the PCP was okay with the patient having surgery, she was concerned with the Plavix. I did let her know that if he felt her not stopping until Saturday was an issue he would have let me know that.

## 2017-09-07 MED ORDER — CEFAZOLIN SODIUM-DEXTROSE 2-4 GM/100ML-% IV SOLN
2.0000 g | INTRAVENOUS | Status: AC
  Administered 2017-09-08: 2 g via INTRAVENOUS

## 2017-09-08 ENCOUNTER — Inpatient Hospital Stay: Admit: 2017-09-08 | Payer: Medicare Other | Admitting: Vascular Surgery

## 2017-09-08 ENCOUNTER — Encounter
Admission: RE | Disposition: A | Discharge status: Home / Self Care | Payer: Self-pay | Source: Ambulatory Visit | Attending: Vascular Surgery

## 2017-09-08 ENCOUNTER — Encounter: Payer: Self-pay | Admitting: *Deleted

## 2017-09-08 ENCOUNTER — Inpatient Hospital Stay: Payer: Medicare Other | Admitting: Anesthesiology

## 2017-09-08 ENCOUNTER — Inpatient Hospital Stay
Admission: RE | Admit: 2017-09-08 | Discharge: 2017-09-10 | DRG: 271 | Disposition: A | Discharge status: Home / Self Care | Payer: Medicare Other | Source: Ambulatory Visit | Attending: Vascular Surgery | Admitting: Vascular Surgery

## 2017-09-08 DIAGNOSIS — I10 Essential (primary) hypertension: Secondary | ICD-10-CM | POA: Diagnosis present

## 2017-09-08 DIAGNOSIS — I70248 Atherosclerosis of native arteries of left leg with ulceration of other part of lower left leg: Secondary | ICD-10-CM

## 2017-09-08 DIAGNOSIS — L97909 Non-pressure chronic ulcer of unspecified part of unspecified lower leg with unspecified severity: Secondary | ICD-10-CM | POA: Diagnosis present

## 2017-09-08 DIAGNOSIS — K219 Gastro-esophageal reflux disease without esophagitis: Secondary | ICD-10-CM | POA: Diagnosis present

## 2017-09-08 DIAGNOSIS — L97929 Non-pressure chronic ulcer of unspecified part of left lower leg with unspecified severity: Secondary | ICD-10-CM | POA: Diagnosis present

## 2017-09-08 DIAGNOSIS — E785 Hyperlipidemia, unspecified: Secondary | ICD-10-CM | POA: Diagnosis present

## 2017-09-08 DIAGNOSIS — E1151 Type 2 diabetes mellitus with diabetic peripheral angiopathy without gangrene: Secondary | ICD-10-CM | POA: Diagnosis present

## 2017-09-08 DIAGNOSIS — E11622 Type 2 diabetes mellitus with other skin ulcer: Secondary | ICD-10-CM | POA: Diagnosis present

## 2017-09-08 DIAGNOSIS — I70299 Other atherosclerosis of native arteries of extremities, unspecified extremity: Secondary | ICD-10-CM | POA: Diagnosis present

## 2017-09-08 DIAGNOSIS — I70202 Unspecified atherosclerosis of native arteries of extremities, left leg: Principal | ICD-10-CM | POA: Diagnosis present

## 2017-09-08 DIAGNOSIS — I739 Peripheral vascular disease, unspecified: Secondary | ICD-10-CM | POA: Diagnosis present

## 2017-09-08 HISTORY — PX: ENDARTERECTOMY FEMORAL: SHX5804

## 2017-09-08 LAB — ABO/RH: ABO/RH(D): B POS

## 2017-09-08 LAB — GLUCOSE, CAPILLARY
Glucose-Capillary: 119 mg/dL — ABNORMAL HIGH (ref 65–99)
Glucose-Capillary: 156 mg/dL — ABNORMAL HIGH (ref 65–99)
Glucose-Capillary: 141 mg/dL — ABNORMAL HIGH (ref 65–99)

## 2017-09-08 SURGERY — ENDARTERECTOMY, FEMORAL
Anesthesia: General | Laterality: Left

## 2017-09-08 SURGERY — ENDARTERECTOMY, FEMORAL
Anesthesia: General | Site: Groin | Laterality: Left | Wound class: Clean

## 2017-09-08 MED ORDER — PHENOL 1.4 % MT LIQD
1.0000 | OROMUCOSAL | Status: DC | PRN
  Filled 2017-09-08: qty 177

## 2017-09-08 MED ORDER — EPHEDRINE SULFATE 50 MG/ML IJ SOLN
INTRAMUSCULAR | Status: AC
  Filled 2017-09-08: qty 1

## 2017-09-08 MED ORDER — POTASSIUM CHLORIDE CRYS ER 20 MEQ PO TBCR: 20.0000 meq | EXTENDED_RELEASE_TABLET | Freq: Every day | ORAL | Status: DC | PRN

## 2017-09-08 MED ORDER — ONDANSETRON HCL 4 MG/2ML IJ SOLN
INTRAMUSCULAR | Status: AC
  Filled 2017-09-08: qty 2

## 2017-09-08 MED ORDER — ACETAMINOPHEN 325 MG PO TABS: 325.0000 mg | ORAL_TABLET | ORAL | Status: DC | PRN

## 2017-09-08 MED ORDER — VITAMIN D 1000 UNITS PO TABS
1000.0000 [IU] | ORAL_TABLET | Freq: Every day | ORAL | Status: DC
  Administered 2017-09-09 – 2017-09-10 (×2): 1000 [IU] via ORAL
  Filled 2017-09-08 (×3): qty 1

## 2017-09-08 MED ORDER — ETOMIDATE 2 MG/ML IV SOLN
INTRAVENOUS | Status: DC | PRN
  Administered 2017-09-08: 12 mg via INTRAVENOUS

## 2017-09-08 MED ORDER — HEPARIN SODIUM (PORCINE) 1000 UNIT/ML IJ SOLN
INTRAMUSCULAR | Status: DC | PRN
  Administered 2017-09-08: 4000 [IU] via INTRAVENOUS

## 2017-09-08 MED ORDER — METFORMIN HCL 500 MG PO TABS: 1000.0000 mg | ORAL_TABLET | Freq: Two times a day (BID) | ORAL | Status: DC

## 2017-09-08 MED ORDER — ROCURONIUM BROMIDE 100 MG/10ML IV SOLN
INTRAVENOUS | Status: DC | PRN
Start: 2017-09-08 — End: 2017-09-08
  Administered 2017-09-08: 5 mg via INTRAVENOUS
  Administered 2017-09-08: 30 mg via INTRAVENOUS
  Administered 2017-09-08: 5 mg via INTRAVENOUS

## 2017-09-08 MED ORDER — DOCUSATE SODIUM 100 MG PO CAPS
100.0000 mg | ORAL_CAPSULE | Freq: Every day | ORAL | Status: DC
  Administered 2017-09-09 – 2017-09-10 (×2): 100 mg via ORAL
  Filled 2017-09-08 (×2): qty 1

## 2017-09-08 MED ORDER — PHENYLEPHRINE HCL 10 MG/ML IJ SOLN
INTRAMUSCULAR | Status: AC
  Filled 2017-09-08: qty 1

## 2017-09-08 MED ORDER — TRAMADOL HCL 50 MG PO TABS
50.0000 mg | ORAL_TABLET | Freq: Four times a day (QID) | ORAL | Status: DC | PRN
  Administered 2017-09-10: 50 mg via ORAL
  Filled 2017-09-08: qty 1

## 2017-09-08 MED ORDER — ESMOLOL HCL-SODIUM CHLORIDE 2000 MG/100ML IV SOLN
25.0000 ug/kg/min | INTRAVENOUS | Status: DC
  Filled 2017-09-08: qty 100

## 2017-09-08 MED ORDER — SUGAMMADEX SODIUM 200 MG/2ML IV SOLN
INTRAVENOUS | Status: AC
  Filled 2017-09-08: qty 2

## 2017-09-08 MED ORDER — HEPARIN SODIUM (PORCINE) 5000 UNIT/ML IJ SOLN
INTRAMUSCULAR | Status: AC
  Filled 2017-09-08: qty 1

## 2017-09-08 MED ORDER — FENTANYL CITRATE (PF) 100 MCG/2ML IJ SOLN
INTRAMUSCULAR | Status: DC | PRN
  Administered 2017-09-08: 25 ug via INTRAVENOUS
  Administered 2017-09-08: 75 ug via INTRAVENOUS

## 2017-09-08 MED ORDER — METFORMIN HCL 500 MG PO TABS
1000.0000 mg | ORAL_TABLET | Freq: Two times a day (BID) | ORAL | Status: DC
  Administered 2017-09-09 – 2017-09-10 (×2): 1000 mg via ORAL
  Filled 2017-09-08 (×5): qty 2

## 2017-09-08 MED ORDER — CLOPIDOGREL BISULFATE 75 MG PO TABS
75.0000 mg | ORAL_TABLET | Freq: Every day | ORAL | Status: DC
  Administered 2017-09-09 – 2017-09-10 (×2): 75 mg via ORAL
  Filled 2017-09-08 (×2): qty 1

## 2017-09-08 MED ORDER — ASPIRIN EC 81 MG PO TBEC
81.0000 mg | DELAYED_RELEASE_TABLET | Freq: Every day | ORAL | Status: DC
  Administered 2017-09-09 – 2017-09-10 (×2): 81 mg via ORAL
  Filled 2017-09-08 (×3): qty 1

## 2017-09-08 MED ORDER — IRBESARTAN 150 MG PO TABS
300.0000 mg | ORAL_TABLET | Freq: Every day | ORAL | Status: DC
  Administered 2017-09-09: 300 mg via ORAL
  Filled 2017-09-08 (×3): qty 2

## 2017-09-08 MED ORDER — SUCCINYLCHOLINE CHLORIDE 20 MG/ML IJ SOLN
INTRAMUSCULAR | Status: AC
  Filled 2017-09-08: qty 1

## 2017-09-08 MED ORDER — SODIUM CHLORIDE 0.9 % IV SOLN
INTRAVENOUS | Status: DC
  Administered 2017-09-08: 07:00:00 via INTRAVENOUS

## 2017-09-08 MED ORDER — ETOMIDATE 2 MG/ML IV SOLN
INTRAVENOUS | Status: AC
  Filled 2017-09-08: qty 10

## 2017-09-08 MED ORDER — GUAIFENESIN-DM 100-10 MG/5ML PO SYRP
15.0000 mL | ORAL_SOLUTION | ORAL | Status: DC | PRN
  Filled 2017-09-08: qty 15

## 2017-09-08 MED ORDER — SODIUM CHLORIDE 0.9 % IV SOLN: 500.0000 mL | Freq: Once | INTRAVENOUS | Status: DC | PRN

## 2017-09-08 MED ORDER — POLYETHYLENE GLYCOL 3350 17 G PO PACK: 17.0000 g | PACK | Freq: Every day | ORAL | Status: DC | PRN

## 2017-09-08 MED ORDER — HYDROCHLOROTHIAZIDE 25 MG PO TABS
25.0000 mg | ORAL_TABLET | Freq: Every day | ORAL | Status: DC
  Administered 2017-09-09 – 2017-09-10 (×2): 25 mg via ORAL
  Filled 2017-09-08 (×3): qty 1

## 2017-09-08 MED ORDER — DOPAMINE-DEXTROSE 3.2-5 MG/ML-% IV SOLN: 3.0000 ug/kg/min | INTRAVENOUS | Status: DC

## 2017-09-08 MED ORDER — HEPARIN SODIUM (PORCINE) 1000 UNIT/ML IJ SOLN
INTRAMUSCULAR | Status: AC
  Filled 2017-09-08: qty 1

## 2017-09-08 MED ORDER — FENTANYL CITRATE (PF) 100 MCG/2ML IJ SOLN
INTRAMUSCULAR | Status: AC
  Filled 2017-09-08: qty 2

## 2017-09-08 MED ORDER — MORPHINE SULFATE (PF) 2 MG/ML IV SOLN
2.0000 mg | INTRAVENOUS | Status: DC | PRN
  Administered 2017-09-08 – 2017-09-09 (×3): 2 mg via INTRAVENOUS
  Filled 2017-09-08 (×3): qty 1

## 2017-09-08 MED ORDER — CHLORHEXIDINE GLUCONATE CLOTH 2 % EX PADS
6.0000 | MEDICATED_PAD | Freq: Once | CUTANEOUS | Status: AC
  Administered 2017-09-08: 6 via TOPICAL

## 2017-09-08 MED ORDER — EVICEL 2 ML EX KIT
PACK | CUTANEOUS | Status: AC
  Filled 2017-09-08: qty 1

## 2017-09-08 MED ORDER — LABETALOL HCL 5 MG/ML IV SOLN
10.0000 mg | INTRAVENOUS | Status: DC | PRN
  Administered 2017-09-08 (×2): 10 mg via INTRAVENOUS
  Filled 2017-09-08 (×2): qty 4

## 2017-09-08 MED ORDER — PANTOPRAZOLE SODIUM 20 MG PO TBEC
20.0000 mg | DELAYED_RELEASE_TABLET | Freq: Every day | ORAL | Status: DC
  Filled 2017-09-08: qty 1

## 2017-09-08 MED ORDER — CHOLECALCIFEROL 25 MCG (1000 UT) PO CHEW: CHEWABLE_TABLET | Freq: Every day | ORAL | Status: DC

## 2017-09-08 MED ORDER — LIDOCAINE HCL (CARDIAC) 20 MG/ML IV SOLN
INTRAVENOUS | Status: DC | PRN
  Administered 2017-09-08: 50 mg via INTRAVENOUS

## 2017-09-08 MED ORDER — AMLODIPINE BESYLATE 10 MG PO TABS
10.0000 mg | ORAL_TABLET | Freq: Every day | ORAL | Status: DC
  Administered 2017-09-09 – 2017-09-10 (×2): 10 mg via ORAL
  Filled 2017-09-08 (×2): qty 1

## 2017-09-08 MED ORDER — SUGAMMADEX SODIUM 200 MG/2ML IV SOLN
INTRAVENOUS | Status: DC | PRN
  Administered 2017-09-08: 120 mg via INTRAVENOUS

## 2017-09-08 MED ORDER — SORBITOL 70 % SOLN
30.0000 mL | Freq: Every day | Status: DC | PRN
  Filled 2017-09-08: qty 30

## 2017-09-08 MED ORDER — SODIUM CHLORIDE 0.9 % IV SOLN
INTRAVENOUS | Status: DC | PRN
  Administered 2017-09-08: 07:00:00 via INTRAVENOUS

## 2017-09-08 MED ORDER — FENTANYL CITRATE (PF) 100 MCG/2ML IJ SOLN
25.0000 ug | INTRAMUSCULAR | Status: DC | PRN
  Administered 2017-09-08: 25 ug via INTRAVENOUS

## 2017-09-08 MED ORDER — ONDANSETRON HCL 4 MG/2ML IJ SOLN
4.0000 mg | Freq: Four times a day (QID) | INTRAMUSCULAR | Status: DC | PRN
  Administered 2017-09-08: 4 mg via INTRAVENOUS
  Filled 2017-09-08: qty 2

## 2017-09-08 MED ORDER — EVICEL 2 ML EX KIT
PACK | CUTANEOUS | Status: DC | PRN
  Administered 2017-09-08: 2 mL

## 2017-09-08 MED ORDER — ONDANSETRON HCL 4 MG/2ML IJ SOLN: 4.0000 mg | Freq: Once | INTRAMUSCULAR | Status: DC | PRN

## 2017-09-08 MED ORDER — MAGNESIUM SULFATE 2 GM/50ML IV SOLN
2.0000 g | Freq: Every day | INTRAVENOUS | Status: DC | PRN
  Filled 2017-09-08: qty 50

## 2017-09-08 MED ORDER — SODIUM CHLORIDE 0.9 % IJ SOLN
INTRAMUSCULAR | Status: AC
  Filled 2017-09-08: qty 10

## 2017-09-08 MED ORDER — NITROGLYCERIN IN D5W 200-5 MCG/ML-% IV SOLN: 5.0000 ug/min | INTRAVENOUS | Status: DC

## 2017-09-08 MED ORDER — FAMOTIDINE IN NACL 20-0.9 MG/50ML-% IV SOLN
20.0000 mg | Freq: Two times a day (BID) | INTRAVENOUS | Status: DC
  Administered 2017-09-08 – 2017-09-09 (×3): 20 mg via INTRAVENOUS
  Filled 2017-09-08 (×3): qty 50

## 2017-09-08 MED ORDER — METOPROLOL TARTRATE 5 MG/5ML IV SOLN: 2.0000 mg | INTRAVENOUS | Status: DC | PRN

## 2017-09-08 MED ORDER — SUCCINYLCHOLINE CHLORIDE 20 MG/ML IJ SOLN
INTRAMUSCULAR | Status: DC | PRN
  Administered 2017-09-08: 60 mg via INTRAVENOUS

## 2017-09-08 MED ORDER — HYDRALAZINE HCL 20 MG/ML IJ SOLN: 5.0000 mg | INTRAMUSCULAR | Status: DC | PRN

## 2017-09-08 MED ORDER — SODIUM CHLORIDE 0.9 % IV SOLN: INTRAVENOUS | Status: DC

## 2017-09-08 MED ORDER — CARVEDILOL 3.125 MG PO TABS
6.2500 mg | ORAL_TABLET | Freq: Every day | ORAL | Status: DC
  Administered 2017-09-08 – 2017-09-09 (×2): 6.25 mg via ORAL
  Filled 2017-09-08: qty 1
  Filled 2017-09-08: qty 2

## 2017-09-08 MED ORDER — ATORVASTATIN CALCIUM 10 MG PO TABS
10.0000 mg | ORAL_TABLET | Freq: Every day | ORAL | Status: DC
Start: 2017-09-09 — End: 2017-09-10
  Administered 2017-09-09 – 2017-09-10 (×2): 10 mg via ORAL
  Filled 2017-09-08 (×2): qty 1

## 2017-09-08 MED ORDER — OXYCODONE-ACETAMINOPHEN 5-325 MG PO TABS: 1.0000 | ORAL_TABLET | ORAL | Status: DC | PRN

## 2017-09-08 MED ORDER — ALUM & MAG HYDROXIDE-SIMETH 200-200-20 MG/5ML PO SUSP
15.0000 mL | ORAL | Status: DC | PRN
  Filled 2017-09-08: qty 30

## 2017-09-08 MED ORDER — CHLORHEXIDINE GLUCONATE CLOTH 2 % EX PADS: 6.0000 | MEDICATED_PAD | Freq: Once | CUTANEOUS | Status: DC

## 2017-09-08 MED ORDER — DEXTROSE 5 % IV SOLN
1.5000 g | Freq: Two times a day (BID) | INTRAVENOUS | Status: AC
  Administered 2017-09-08 – 2017-09-09 (×2): 1.5 g via INTRAVENOUS
  Filled 2017-09-08 (×2): qty 1.5

## 2017-09-08 MED ORDER — ACETAMINOPHEN 650 MG RE SUPP: 325.0000 mg | RECTAL | Status: DC | PRN

## 2017-09-08 MED ORDER — CEFAZOLIN SODIUM-DEXTROSE 2-4 GM/100ML-% IV SOLN
INTRAVENOUS | Status: AC
  Filled 2017-09-08: qty 100

## 2017-09-08 MED ORDER — VALSARTAN-HYDROCHLOROTHIAZIDE 320-25 MG PO TABS: 1.0000 | ORAL_TABLET | Freq: Every day | ORAL | Status: DC

## 2017-09-08 MED ORDER — ONDANSETRON HCL 4 MG/2ML IJ SOLN
INTRAMUSCULAR | Status: DC | PRN
  Administered 2017-09-08: 4 mg via INTRAVENOUS

## 2017-09-08 MED ORDER — PANTOPRAZOLE SODIUM 40 MG PO TBEC
40.0000 mg | DELAYED_RELEASE_TABLET | Freq: Every day | ORAL | Status: DC
  Administered 2017-09-09 – 2017-09-10 (×2): 40 mg via ORAL
  Filled 2017-09-08 (×3): qty 1

## 2017-09-08 MED ORDER — ROCURONIUM BROMIDE 50 MG/5ML IV SOLN
INTRAVENOUS | Status: AC
  Filled 2017-09-08: qty 1

## 2017-09-08 MED ORDER — SENNOSIDES-DOCUSATE SODIUM 8.6-50 MG PO TABS: 1.0000 | ORAL_TABLET | Freq: Every evening | ORAL | Status: DC | PRN

## 2017-09-08 MED ORDER — COLLAGENASE 250 UNIT/GM EX OINT
1.0000 "application " | TOPICAL_OINTMENT | Freq: Every day | CUTANEOUS | Status: DC
  Administered 2017-09-09 – 2017-09-10 (×2): 1 via TOPICAL
  Filled 2017-09-08 (×2): qty 30

## 2017-09-08 MED ORDER — LIDOCAINE HCL (PF) 2 % IJ SOLN
INTRAMUSCULAR | Status: AC
  Filled 2017-09-08: qty 2

## 2017-09-08 SURGICAL SUPPLY — 62 items
ADH SKN CLS APL DERMABOND .7 (GAUZE/BANDAGES/DRESSINGS) ×1
APPLIER CLIP 11 MED OPEN (CLIP)
APPLIER CLIP 9.375 SM OPEN (CLIP)
APR CLP MED 11 20 MLT OPN (CLIP)
APR CLP SM 9.3 20 MLT OPN (CLIP)
BAG COUNTER SPONGE EZ (MISCELLANEOUS) ×1 IMPLANT
BAG DECANTER FOR FLEXI CONT (MISCELLANEOUS) ×3 IMPLANT
BAG SPNG 4X4 CLR HAZ (MISCELLANEOUS)
BLADE SURG 15 STRL LF DISP TIS (BLADE) ×1 IMPLANT
BLADE SURG 15 STRL SS (BLADE) ×3
BLADE SURG SZ11 CARB STEEL (BLADE) ×3 IMPLANT
BOOT SUTURE AID YELLOW STND (SUTURE) ×3 IMPLANT
BRUSH SCRUB EZ  4% CHG (MISCELLANEOUS) ×2
BRUSH SCRUB EZ 4% CHG (MISCELLANEOUS) ×1 IMPLANT
CANISTER SUCT 1200ML W/VALVE (MISCELLANEOUS) ×3 IMPLANT
CLIP APPLIE 11 MED OPEN (CLIP) IMPLANT
CLIP APPLIE 9.375 SM OPEN (CLIP) IMPLANT
COUNTER SPONGE BAG EZ (MISCELLANEOUS)
DERMABOND ADVANCED (GAUZE/BANDAGES/DRESSINGS) ×2
DERMABOND ADVANCED .7 DNX12 (GAUZE/BANDAGES/DRESSINGS) ×1 IMPLANT
DRAPE INCISE IOBAN 66X45 STRL (DRAPES) ×3 IMPLANT
DRAPE LAPAROTOMY 100X77 ABD (DRAPES) ×3 IMPLANT
DURAPREP 26ML APPLICATOR (WOUND CARE) ×3 IMPLANT
ELECT CAUTERY BLADE 6.4 (BLADE) ×3 IMPLANT
ELECT REM PT RETURN 9FT ADLT (ELECTROSURGICAL) ×3
ELECTRODE REM PT RTRN 9FT ADLT (ELECTROSURGICAL) ×1 IMPLANT
EVICEL 2ML SEALANT HUMAN (Miscellaneous) ×2 IMPLANT
GLOVE BIO SURGEON STRL SZ7 (GLOVE) ×6 IMPLANT
GLOVE INDICATOR 7.5 STRL GRN (GLOVE) ×3 IMPLANT
GOWN STRL REUS W/ TWL LRG LVL3 (GOWN DISPOSABLE) ×2 IMPLANT
GOWN STRL REUS W/ TWL XL LVL3 (GOWN DISPOSABLE) ×2 IMPLANT
GOWN STRL REUS W/TWL LRG LVL3 (GOWN DISPOSABLE) ×6
GOWN STRL REUS W/TWL XL LVL3 (GOWN DISPOSABLE) ×6
HEMOSTAT SURGICEL 2X3 (HEMOSTASIS) ×1 IMPLANT
IV NS 500ML (IV SOLUTION) ×3
IV NS 500ML BAXH (IV SOLUTION) ×1 IMPLANT
KIT RM TURNOVER STRD PROC AR (KITS) ×3 IMPLANT
LABEL OR SOLS (LABEL) ×3 IMPLANT
LOOP RED MAXI  1X406MM (MISCELLANEOUS) ×6
LOOP VESSEL MAXI 1X406 RED (MISCELLANEOUS) ×2 IMPLANT
LOOP VESSEL MINI 0.8X406 BLUE (MISCELLANEOUS) ×2 IMPLANT
LOOPS BLUE MINI 0.8X406MM (MISCELLANEOUS) ×4
NS IRRIG 500ML POUR BTL (IV SOLUTION) ×3 IMPLANT
PACK BASIN MAJOR ARMC (MISCELLANEOUS) ×3 IMPLANT
PATCH CAROTID ECM VASC 1X10 (Prosthesis & Implant Heart) ×2 IMPLANT
SUT PROLENE 5 0 RB 1 DA (SUTURE) ×6 IMPLANT
SUT PROLENE 6 0 BV (SUTURE) ×12 IMPLANT
SUT PROLENE 7 0 BV 1 (SUTURE) ×6 IMPLANT
SUT SILK 2 0 (SUTURE) ×3
SUT SILK 2-0 18XBRD TIE 12 (SUTURE) ×1 IMPLANT
SUT SILK 3 0 (SUTURE) ×3
SUT SILK 3-0 18XBRD TIE 12 (SUTURE) ×1 IMPLANT
SUT SILK 4 0 (SUTURE) ×3
SUT SILK 4-0 18XBRD TIE 12 (SUTURE) ×1 IMPLANT
SUT VIC AB 2-0 CT1 27 (SUTURE) ×6
SUT VIC AB 2-0 CT1 TAPERPNT 27 (SUTURE) ×2 IMPLANT
SUT VIC AB 3-0 SH 27 (SUTURE) ×3
SUT VIC AB 3-0 SH 27X BRD (SUTURE) ×1 IMPLANT
SUT VICRYL+ 3-0 36IN CT-1 (SUTURE) ×6 IMPLANT
SYR 20CC LL (SYRINGE) ×3 IMPLANT
SYR 5ML LL (SYRINGE) ×3 IMPLANT
TRAY FOLEY W/METER SILVER 16FR (SET/KITS/TRAYS/PACK) ×3 IMPLANT

## 2017-09-08 NOTE — Transfer of Care (Signed)
Immediate Anesthesia Transfer of Care Note  Patient: [name]  Procedure(s) Performed: ENDARTERECTOMY FEMORAL WITH CORMATRIX PATCH (Left Groin)  Patient Location: PACU  Anesthesia Type:General  Level of Consciousness: sedated and responds to stimulation  Airway & Oxygen Therapy: Patient Spontanous Breathing and Patient connected to face mask oxygen  Post-op Assessment: Report given to RN and Post -op Vital signs reviewed and stable  Post vital signs: Reviewed and stable  Last Vitals:  Vitals:   09/08/17 0616 09/08/17 1013  BP: (!) 155/75 (!) 164/73  Pulse: 98 71  Resp: (!) 98 (!) 27  Temp: (!) 35.8 C   SpO2: 100% 100%    Last Pain:  Vitals:   09/08/17 0616  TempSrc: Tympanic         Complications: No apparent anesthesia complications

## 2017-09-08 NOTE — Progress Notes (Signed)
PT Cancellation Note  Patient Details Name: SHONTAVIA MICKEL MRN: [id number] DOB: 07-25-1926   Cancelled Treatment:    Reason Eval/Treat Not Completed: Medical issues which prohibited therapy; Nursing requested that PT eval be held until tomorrow secondary to pt recently awake from general anesthesia with nausea.  Will hold PT eval per nursing and attempt to see pt 09/09/17 as medically appropriate.     Linus Salmons PT, DPT 09/08/17, 2:14 PM

## 2017-09-08 NOTE — Anesthesia Post-op Follow-up Note (Signed)
Anesthesia QCDR form completed.        

## 2017-09-08 NOTE — Anesthesia Procedure Notes (Signed)
Procedure Name: Intubation Performed by: Brynden Thune Pre-anesthesia Checklist: Patient identified, Patient being monitored, Timeout performed, Emergency Drugs available and Suction available Patient Re-evaluated:Patient Re-evaluated prior to induction Oxygen Delivery Method: Circle system utilized Preoxygenation: Pre-oxygenation with 100% oxygen Induction Type: IV induction Ventilation: Mask ventilation without difficulty Laryngoscope Size: Mac and 3 Grade View: Grade I Tube type: Oral Tube size: 7.0 mm Number of attempts: 1 Airway Equipment and Method: Stylet Placement Confirmation: ETT inserted through vocal cords under direct vision,  positive ETCO2 and breath sounds checked- equal and bilateral Secured at: 22 cm Tube secured with: Tape Dental Injury: Teeth and Oropharynx as per pre-operative assessment        

## 2017-09-08 NOTE — Progress Notes (Signed)
[facility]  Daily Progress Note   Subjective  - Day of Surgery  Patient with some postoperative nausea.  Somewhat improved with Zofran.  Otherwise no major events.  Objective Vitals:   09/08/17 1400 09/08/17 1500 09/08/17 1600 09/08/17 1620  BP: (!) 155/80 (!) 149/75 (!) 146/68   Pulse: 76 77 76 74  Resp: (!) 21 (!) 31 (!) 28 (!) 33  Temp:   (!) 97 F (36.1 C)   TempSrc:   Oral   SpO2: 98% 98% 99% 97%  Weight:      Height:        Intake/Output Summary (Last 24 hours) at 09/08/17 1731 Last data filed at 09/08/17 1600  Gross per 24 hour  Intake              670 ml  Output             1020 ml  Net             -350 ml    PULM  CTAB CV  RRR VASC  incision is intact.  Foot is warm with good Doppler signals.  Laboratory CBC    Component Value Date/Time   WBC 6.0 09/02/2017 1306   HGB 10.5 (L) 09/02/2017 1306   HCT 31.2 (L) 09/02/2017 1306   PLT 428 09/02/2017 1306    BMET    Component Value Date/Time   NA 141 09/02/2017 1306   K 3.9 09/02/2017 1306   CL 104 09/02/2017 1306   CO2 25 09/02/2017 1306   GLUCOSE 96 09/02/2017 1306   BUN 19 09/02/2017 1306   CREATININE 1.01 (H) 09/02/2017 1306   CALCIUM 9.6 09/02/2017 1306   GFRNONAA 48 (L) 09/02/2017 1306   GFRAA 55 (L) 09/02/2017 1306    Assessment/Planning: POD #0 s/p left common, superficial femoral, and profunda femoris endarterectomy and patch angioplasty   Some postoperative nausea.  Given her age and comorbidities we are going to watch her overnight in the ICU.  Plan to check labs tomorrow morning.  PT to see the patient tomorrow.    Leotis Pain  09/08/2017, 5:31 PM

## 2017-09-08 NOTE — H&P (Signed)
[facility] History & Physical Update  The patient was interviewed and re-examined.  The patient's previous History and Physical has been reviewed and is unchanged.  There is no change in the plan of care. We plan to proceed with the scheduled procedure.  Leotis Pain, MD  09/08/2017, 7:21 AM

## 2017-09-08 NOTE — Op Note (Signed)
OPERATIVE NOTE   PROCEDURE: 1. 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  ASSISTANT(S): None  ANESTHESIA: general  ESTIMATED BLOOD LOSS: 300 cc  FINDING(S): 1. Profound calcific plaque noted of the left common femoral extending past the initial bifurcation of the profunda femoris arteries as well as down the extensive length of the SFA  SPECIMEN(S):  Calcific plaque from the common femoral, superficial femoral and the profunda femoris artery  INDICATIONS:   [name] 83 y.o. y.o.female who presents with complaints of lifestyle limiting claudication and pain continuously in the left lower extremity. The patient has documented severe atherosclerotic occlusive disease and has undergone minimally invasive treatments in the past. However, at this point his primary area of stricture stenosis resides in the common femoral and origins of the superficial femoral and profunda femoris extending into these arteries and therefore this is not amenable to intervention. The patient is therefore undergoing open endarterectomy. The risks and benefits of surgery have been reviewed with the patient, all questions have answered; alternative therapies have been reviewed as well and the patient has agreed to proceed with surgical open repair.  DESCRIPTION: After obtaining full informed written consent, the patient was brought back to the operating room and placed supine upon the operating table.  The patient received IV antibiotics prior to induction.  After obtaining adequate anesthesia, the patient was prepped and draped in the standard fashion for left femoral exposure.   Attention was turned to the left groin with Dr. Lucky Cowboy working on the left and myself working on the right of the patient.  Vertical  Incision was made over the left common femoral artery and dissection carried down to the common femoral artery with electrocautery.  I dissected out the common femoral artery from the distal external iliac artery (identified by the superficial circumflex vessels) down to the femoral bifurcation.  On initial inspection, the common femoral artery was: densely calcified and there was no palpable pulse noted.    Subsequently the dissection was continued to include all circumflex branches and the profunda femoral artery and superficial femoral artery. The superficial femoral artery was dissected circumferentially for a distance of approximately 3-4 cm and the profunda femoris was dissected circumferentially out to the fourth order branches individual vessel loops were placed around each branch.  Control of all branches was obtained with vessel loops.  A softer area in the distal external iliac artery amendable to clamping was identified.    The patient was given 5000 units of Heparin intravenously, which was a therapeutic bolus.   After waiting 3 minutes, the distal external iliac artery was clamped and all of the vessel loops were placed under tension.  Arteriotomy was made in the common femoral artery with a 11-blade and extended it with a Potts scissor proximally and distally extending the distal end down the profunda femoris for approximately 5 cm.   Endarterectomy was then performed under direct visualization using a freer elevator and a right angle from the mid common femoral extending up both proximally and distally. Proximally the endarterectomy was brought up to the level of the clamp where a clean edge was obtained. Distally the endarterectomy was carried down to a soft spot in the profunda femoris where a feathered edge would was obtained.  7-0  Prolene interrupted tacking  sutures were placed to secure the leading edge of the plaque in the common femoral.  The SFA was treated with an eversion technique extending endarterectomy approximately 2 cm distally again obtaining a featheredge on the left.   At this point, a corematrix patch was fashioned for the geometry of the arteriotomy.  The patch was sewn to the artery with 2 running stitches of 6-0 Prolene, running from each end.  Prior to completing the patch angioplasty, the profunda femoral artery was flushed as was the superficial femoral artery. The system was then forward flushed. The endarterectomy site was then irrigated copiously with heparinized saline. The patch angioplasty was completed in the usual fashion.  Flow was then reestablished first to the profunda femoris and then the superficial femoral artery. Any gaps or bleeding sites in the suture line were easily controlled with a 6-0 Prolene suture. Doppler is then delivered onto the field and the SFA as well as the profunda femoris arteries were interrogated and found to have triphasic Doppler signals.  The left groin was then irrigated copiously with sterile saline and subsequently Evicel and Surgicel were placed in the wound. The incision was repaired with a double layer of 2-0 Vicryl, a double layer of 3-0 Vicryl, and a layer of 4-0 Monocryl in a subcuticular fashion.  The skin was cleaned, dried, and reinforced with Dermabond.  COMPLICATIONS: None  CONDITION: [name], M.D. [facility] Office: [phone]  09/08/2017, 10:52 AM

## 2017-09-08 NOTE — Anesthesia Preprocedure Evaluation (Signed)
Anesthesia Evaluation  Patient identified by MRN, date of birth, ID band Patient awake    Reviewed: Allergy & Precautions, H&P , NPO status , Patient's Chart, lab work & pertinent test results, reviewed documented beta blocker date and time   Airway Mallampati: II  TM Distance: >3 FB Neck ROM: full    Dental  (+) Teeth Intact   Pulmonary neg pulmonary ROS,    Pulmonary exam normal        Cardiovascular Exercise Tolerance: Good hypertension, + Peripheral Vascular Disease  negative cardio ROS Normal cardiovascular exam Rate:Normal     Neuro/Psych TIACVA negative neurological ROS  negative psych ROS   GI/Hepatic negative GI ROS, Neg liver ROS, GERD  Medicated,  Endo/Other  negative endocrine ROSdiabetes  Renal/GU Renal diseasenegative Renal ROS  negative genitourinary   Musculoskeletal   Abdominal   Peds  Hematology negative hematology ROS (+) anemia ,   Anesthesia Other Findings   Reproductive/Obstetrics negative OB ROS                             Anesthesia Physical Anesthesia Plan  ASA: III  Anesthesia Plan: General LMA   Post-op Pain Management:    Induction:   PONV Risk Score and Plan: 4 or greater and Ondansetron, Dexamethasone, Midazolam and Propofol infusion  Airway Management Planned:   Additional Equipment:   Intra-op Plan:   Post-operative Plan:   Informed Consent: I have reviewed the patients History and Physical, chart, labs and discussed the procedure including the risks, benefits and alternatives for the proposed anesthesia with the patient or authorized representative who has indicated his/her understanding and acceptance.     Plan Discussed with: CRNA  Anesthesia Plan Comments:         Anesthesia Quick Evaluation

## 2017-09-08 NOTE — Op Note (Signed)
OPERATIVE NOTE   PROCEDURE: 1. Left common femoral, profunda femoris, and superficial femoral artery endarterectomies and patch angioplasty    PRE-OPERATIVE DIAGNOSIS: 1.Atherosclerotic occlusive disease left lower extremities with ulceration 2. S/p left iliac artery stent placement  POST-OPERATIVE DIAGNOSIS: Same  SURGEON: Leotis Pain, MD  CO-surgeon: Hortencia Pilar, MD  ANESTHESIA: general  ESTIMATED BLOOD LOSS: 75 cc  FINDING(S): 1. significant plaque in left common femoral, profunda femoris, and superficial femoral arteries  SPECIMEN(S): Left common femoral, profunda femoris, and superficial femoral artery plaque.  INDICATIONS:  Patient presents with non-healing ulceration of the left leg. She has had iliac intervention, but has a left femoral occlusion involving the common, superficial, and profunda femorus arteries.  Left femoral endarterectomy is planned to try to improve perfusion. The risks and benefits as well as alternative therapies including intervention were reviewed in detail all questions were answered the patient agrees to proceed with surgery.  DESCRIPTION: After obtaining full informed written consent, the patient was brought back to the operating room and placed supine upon the operating table. The patient received IV antibiotics prior to induction. After obtaining adequate anesthesia, the patient was prepped and draped in the standard fashion appropriate time out is called.   Vertical incision was created overlying the left femoral arteries. The common femoral artery proximally, and superficial femoral artery, and primary profunda femoris artery branches were encircled with vessel loops and prepared for control. The left femoral arteries were found to have significant plaque from the common femoral artery into the profunda and superficial femoral arteries. We dissected down to the profunda beyond the initial branches out to the primary bifurcation and  controlled each of these branches.   4000 units of heparin was given and allowed circulate for 5 minutes.   Attention is then turned to the left femoral artery. An arteriotomy is made with 11 blade and extended with Potts scissors in the common femoral artery and carried down onto the bifurcation of the profunda femorus artery which was 4-5 cm beyond its origin. An endarterectomy was then performed. The [facility] was used to create a plane. The proximal endpoint was cut flush with tenotomy scissors. This was in the proximal common femoral artery. An eversion endarterectomy was then performed for the first 2-3 cm of the superficial femoral artery with a nice feathered distal endpoint seen in the SFA. Good backbleeding was then seen. The distal endpoint of the profunda femorus endarterectomy was created with gentle traction and the distal endpoint was relatively clean with a single 7-0 Prolene patch suture used in the profunda.All loose flecks of plaque were removed. The Cormatrix patcth is then selected and prepared for a patch angioplasty.  It is cut and beveled and started at the proximal endpoint with a 6-0 Prolene suture.  Approximately one half of the suture line is run medially and laterally and the distal end point was cut and bevelled to match the arteriotomy.  A second 6-0 Prolene was started at the distal end point and run to the mid portion to complete the arteriotomy.  The vessel was flushed prior to release of control and completion of the anastomosis.  At this point, flow was established first to the profunda femoris artery and then to the superficial femoral artery. Easily palpable pulses are noted well beyond the anastomosis and both arteries. The continuous wave doppler was then brought onto the field and excellent signals were heard in both profunda primary branches as well as the SFA beyond the endarterectomy.  Surgicel  and Evicel topical hemostatic agents were placed in the  femoral incision and hemostasis was complete. The femoral incision was then closed in a layered fashion with 2 layers of 2-0 Vicryl, 2 layers of 3-0 Vicryl, and 4-0 Monocryl for the skin closure. Dermabond and sterile dressing were then placed over the incision.  The patient was then awakened from anesthesia and taken to the recovery room in stable condition having tolerated the procedure well.  COMPLICATIONS: None  CONDITION: Stable     Leotis Pain 09/08/2017 9:55 AM   This note was created with Dragon Medical transcription system. Any errors in dictation are purely unintentional.

## 2017-09-08 NOTE — Progress Notes (Signed)
Pt has remained alert and oriented with c/o pain to her L groin surgical incision. Morphine has been given x 1. Pt remains on RA, lung sounds clear to auscultation. SpO2 > 95%. NSR on cardiac monitor. BP is elevated-labetalol given x 1.  Continuous NS 181ml/hr has been held since ICU admit d/t BP. Pt is not taking PO yet however d/t PONV- zofran given x1 with some relief. Nausea seems positional.  Grand-daughter and son have been updated at bedside.

## 2017-09-09 ENCOUNTER — Ambulatory Visit: Payer: Medicare Other | Admitting: Surgery

## 2017-09-09 LAB — BASIC METABOLIC PANEL
Anion gap: 12 (ref 5–15)
BUN: 10 mg/dL (ref 6–20)
CALCIUM: 8.7 mg/dL — AB (ref 8.9–10.3)
CO2: 25 mmol/L (ref 22–32)
CREATININE: 0.78 mg/dL (ref 0.44–1.00)
Chloride: 100 mmol/L — ABNORMAL LOW (ref 101–111)
Glucose, Bld: 133 mg/dL — ABNORMAL HIGH (ref 65–99)
Potassium: 3.5 mmol/L (ref 3.5–5.1)
SODIUM: 137 mmol/L (ref 135–145)

## 2017-09-09 LAB — CBC
HCT: 26.3 % — ABNORMAL LOW (ref 35.0–47.0)
Hemoglobin: 8.7 g/dL — ABNORMAL LOW (ref 12.0–16.0)
MCH: 25.8 pg — AB (ref 26.0–34.0)
MCHC: 33.2 g/dL (ref 32.0–36.0)
MCV: 77.7 fL — ABNORMAL LOW (ref 80.0–100.0)
PLATELETS: 374 10*3/uL (ref 150–440)
RBC: 3.38 MIL/uL — ABNORMAL LOW (ref 3.80–5.20)
RDW: 17 % — ABNORMAL HIGH (ref 11.5–14.5)
WBC: 9.2 10*3/uL (ref 3.6–11.0)

## 2017-09-09 NOTE — Evaluation (Signed)
Physical Therapy Evaluation Patient Details Name: [name] MRN: [id number] DOB: 28-Mar-1926 Today's Date: 09/09/2017   History of Present Illness  Pt is a 83 y.o. female s/p L femoral endarterectomies on 09/08/17, admitted to ICU d/t increased blood pressure s/p surgery. PMH including anemia, HTN, diabetes mellitus and L iliac artery stent (september 2018). Pt currently has a non-healing ulceration on her L anterior tibia.  Clinical Impression  Prior to hospital admission, pt was independent with ADLs and driving. Pt normally uses rollator at home for ambulation; plan to assess mobility with rollator next session as appropriate.  Pt lives in a one-level apartment with her son.  Currently pt is min guard for transfers and ambulation; able to ambulate 52ft with RW, min guard for safety; gait speed decreased, vc's to "walk normal" slightly increased gait speed. Vitals monitored, remained stable throughout session. Pt would benefit from skilled PT to address noted impairments and functional limitations (see below for any additional details).  Upon hospital discharge, recommend pt discharge to home with home health PT.     Follow Up Recommendations Home health PT;Supervision for mobility/OOB    Equipment Recommendations  Rolling walker with 5" wheels    Recommendations for Other Services       Precautions / Restrictions Precautions Precautions: Fall Restrictions Weight Bearing Restrictions: No      Mobility  Bed Mobility               General bed mobility comments: deferred, pt in recliner  Transfers Overall transfer level: Needs assistance Equipment used: Rolling walker (2 wheeled) Transfers: Sit to/from Stand Sit to Stand: Min guard         General transfer comment: Min guard for safety, vc's for sequencing; able to perform steady sit to stand with RW; uncontrolled descent into recliner upon sitting  Ambulation/Gait Ambulation/Gait assistance: Min  guard Ambulation Distance (Feet): 75 Feet Assistive device: Rolling walker (2 wheeled) Gait Pattern/deviations: Step-through pattern;Decreased stride length   Gait velocity interpretation: Below normal speed for age/gender General Gait Details: foot flat gait pattern, slightly corrected but not maintained with vc's for heel strike pattern; decreased step length bilaterally; decreased gait speed, vc's to "walk normally" with slight increase in gait speed  Stairs            Wheelchair Mobility    Modified Rankin (Stroke Patients Only)       Balance Overall balance assessment: Modified Independent   Sitting balance-Leahy Scale: Good Sitting balance - Comments: able to elevate BLE's, resist LE MMT without UE support on edge of chair, no LOB; able to reach outside of BOS while seated, feet supported   Standing balance support: Bilateral upper extremity supported Standing balance-Leahy Scale: Fair Standing balance comment: BUE support on RW in standing; able to perform standing marches BUE support on RW without LOB                             Pertinent Vitals/Pain Pain Assessment: Faces Faces Pain Scale: Hurts a little bit (soreness with L hip flexion >90 degrees) Pain Location: L groin incision site Pain Descriptors / Indicators: Sore Pain Intervention(s): Limited activity within patient's tolerance;Monitored during session    Home Living Family/patient expects to be discharged to:: Private residence Living Arrangements: Children Available Help at Discharge: Available PRN/intermittently;Family Type of Home: House Home Access: Stairs to enter Entrance Stairs-Rails: Right Entrance Stairs-Number of Steps: 2 Home Layout: One level Home Equipment: Shower seat;Grab  bars - toilet;Grab bars - tub/shower;Cane - single point      Prior Function Level of Independence: Independent         Comments: Pt independent with driving and ADLs; uses a rollator for  ambulation; hx of 1 fall within the past year     Hand Dominance        Extremity/Trunk Assessment   Upper Extremity Assessment Upper Extremity Assessment: Overall WFL for tasks assessed    Lower Extremity Assessment Lower Extremity Assessment: Overall WFL for tasks assessed (LE sensation WFL bilaterally)    Cervical / Trunk Assessment Cervical / Trunk Assessment: Normal  Communication   Communication: No difficulties  Cognition Arousal/Alertness: Awake/alert Behavior During Therapy: WFL for tasks assessed/performed Overall Cognitive Status: Within Functional Limits for tasks assessed                                        General Comments General comments (skin integrity, edema, etc.): incision site undressed throughout session as per nursing    Exercises     Assessment/Plan    PT Assessment Patient needs continued PT services  PT Problem List Decreased strength;Decreased activity tolerance;Decreased balance;Decreased mobility;Pain       PT Treatment Interventions DME instruction;Gait training;Stair training;Functional mobility training;Therapeutic activities;Therapeutic exercise;Balance training;Patient/family education    PT Goals (Current goals can be found in the Care Plan section)  Acute Rehab PT Goals Patient Stated Goal: to go home PT Goal Formulation: With patient Time For Goal Achievement: 09/23/17 Potential to Achieve Goals: Fair    Frequency Min 2X/week   Barriers to discharge        Co-evaluation               AM-PAC PT "6 Clicks" Daily Activity  Outcome Measure Difficulty turning over in bed (including adjusting bedclothes, sheets and blankets)?: None Difficulty moving from lying on back to sitting on the side of the bed? : A Little Difficulty sitting down on and standing up from a chair with arms (e.g., wheelchair, bedside commode, etc,.)?: A Little Help needed moving to and from a bed to chair (including a  wheelchair)?: A Little Help needed walking in hospital room?: A Little Help needed climbing 3-5 steps with a railing? : A Little 6 Click Score: 19    End of Session Equipment Utilized During Treatment: Gait belt Activity Tolerance: Patient tolerated treatment well Patient left: in chair;with call bell/phone within reach;with nursing/sitter in room Nurse Communication: Mobility status PT Visit Diagnosis: Other abnormalities of gait and mobility (R26.89);Muscle weakness (generalized) (M62.81)    Time: 2637-8588 PT Time Calculation (min) (ACUTE ONLY): 39 min   Charges:         PT G CodesWetzel Bjornstad, SPT 09/09/2017, 1:18 PM

## 2017-09-09 NOTE — Progress Notes (Signed)
[facility]  Daily Progress Note   Subjective  - 1 Day Post-Op  Doing well Sitting up in chair.  Worked with PT No events overnight  Objective Vitals:   09/09/17 0530 09/09/17 0800 09/09/17 0900 09/09/17 1000  BP: (!) 141/70 (!) 145/65 (!) 143/70 (!) 107/56  Pulse: 78 77 78   Resp: (!) 25 (!) 22 (!) 26 18  Temp:      TempSrc:      SpO2: 97% 97% 97%   Weight:      Height:        Intake/Output Summary (Last 24 hours) at 09/09/17 1213 Last data filed at 09/09/17 0522  Gross per 24 hour  Intake              320 ml  Output             1150 ml  Net             -830 ml    PULM  CTAB CV  RRR VASC  Foot warm. 1+ pedal pulse.  Laboratory CBC    Component Value Date/Time   WBC 9.2 09/09/2017 0008   HGB 8.7 (L) 09/09/2017 0008   HCT 26.3 (L) 09/09/2017 0008   PLT 374 09/09/2017 0008    BMET    Component Value Date/Time   NA 137 09/09/2017 0008   K 3.5 09/09/2017 0008   CL 100 (L) 09/09/2017 0008   CO2 25 09/09/2017 0008   GLUCOSE 133 (H) 09/09/2017 0008   BUN 10 09/09/2017 0008   CREATININE 0.78 09/09/2017 0008   CALCIUM 8.7 (L) 09/09/2017 0008   GFRNONAA >60 09/09/2017 0008   GFRAA >60 09/09/2017 0008    Assessment/Planning: POD #1 s/p left femoral endarterectomy   Doing well  tx to floor today  [facility] diet  Likely home tomorrow    Leotis Pain  09/09/2017, 12:13 PM

## 2017-09-09 NOTE — Anesthesia Postprocedure Evaluation (Signed)
Anesthesia Post Note  Patient: [name]  Procedure(s) Performed: ENDARTERECTOMY FEMORAL WITH CORMATRIX PATCH (Left Groin)  Patient location during evaluation: ICU Anesthesia Type: General Level of consciousness: awake Pain management: pain level controlled Vital Signs Assessment: post-procedure vital signs reviewed and stable Respiratory status: spontaneous breathing and nonlabored ventilation Cardiovascular status: stable Postop Assessment: no apparent nausea or vomiting Anesthetic complications: no     Last Vitals:  Vitals:   09/09/17 0500 09/09/17 0530  BP: 140/70 (!) 141/70  Pulse: 85 78  Resp: (!) 25 (!) 25  Temp:    SpO2: 96% 97%    Last Pain:  Vitals:   09/09/17 0521  TempSrc:   PainSc: 0-No pain                 Rolla Plate P

## 2017-09-09 NOTE — Progress Notes (Signed)
Telephone report called to [facility]  Patient transported via wheelchair.

## 2017-09-10 LAB — SURGICAL PATHOLOGY

## 2017-09-10 MED ORDER — TRAMADOL HCL 50 MG PO TABS: 50.0000 mg | ORAL_TABLET | Freq: Four times a day (QID) | ORAL | 1 refills | Status: DC | PRN

## 2017-09-10 NOTE — Progress Notes (Signed)
Discharge instructions reviewed with granddaughter. She verbalized understanding of discharge instructions. Prescription for tramadol given to granddaughter. Pt escorted out with granddaughter and nurse tech via wheelchair

## 2017-09-10 NOTE — Progress Notes (Signed)
Pt alert and oriented, but forgetful. Sitting on side the bed dressed and ready for discharger. IV removed and dsg to left lower extremity changed. The wound is without any swelling, bleeding, odor or discharge. Pt denies pain after receiving pain medication. Granddaughter at bedside.

## 2017-09-10 NOTE — Care Management Note (Signed)
Case Management Note  Patient Details  Name: [name] MRN: [id number] Date of Birth: 1926-02-12  Subjective/Objective:  Admitted to [facility] following procedure per Dr Lucky Cowboy. Lives alone. Last seen Dr, Rebeca Alert 09/02/17. Prescriptions are filled at [facility] on [address] in [place]. No home health. No skilled facility. No home oxygen. Does have a borrowed Biochemist, clinical in the home. Takes care of all basic and instrumental activities of daily living herself, drives. Good appetite. No falls. Daughter will transport.                  Action/Plan: Physical therapy evaluation completed. Recommending home health and therapy in the home. [name] will be going to stay with daughter, [name], Address: [address]. Please call daughter. [name] to arrange home visits. [phone]).  Discussed Home Health agencies. [facility]ose [facility] for services and rolling walker. [facility]. [facility] representative updated Discharge to home today per Dr. Lucky Cowboy   Expected Discharge Date:  09/10/17               Expected Discharge Plan:     In-House Referral:   yes  Discharge planning Services   yes  Post Acute Care Choice:   yes Choice offered to:   [name] daughter  DME Arranged:   yes DME Agency:   [place]:   yes [place] Agency:   [facility]   Status of Service:   in progress  If discussed at Long Length of Stay Meetings, dates discussed:    Additional Comments:  Alexa Ammons, RN MSN CCM Care Management [phone] 09/10/2017, 10:06 AM

## 2017-09-10 NOTE — Discharge Summary (Signed)
[facility]    Discharge Summary    Patient ID:  [name] MRN: [id number] DOB/AGE: February 23, 1926 83 y.o.  Admit date: 09/08/2017 Discharge date: 09/10/2017 Date of Surgery: 09/08/2017 Surgeon: Surgeon(s): Dew, Erskine Squibb, MD Schnier, Dolores Lory, MD  Admission Diagnosis: ASO W CLAUDICATION  Discharge Diagnoses:  ASO W CLAUDICATION  Secondary Diagnoses: Past Medical History:  Diagnosis Date  . Anemia    family unsure of this history  . Arthritis   . Chronic ulcer of calf (HCC)    Left  . Constipation   . Diabetes mellitus without complication ([place])   . Dyslipidemia   . GERD (gastroesophageal reflux disease)   . Hypertension   . Peripheral vascular disease ([place])     Procedure(s): ENDARTERECTOMY FEMORAL WITH CORMATRIX PATCH  Discharged Condition: good  HPI:  Patient with ulcerations and severe PAD of left leg, here for revascularization  Hospital Course:  [name] is a 83 y.o. female is S/P Left Procedure(s): ENDARTERECTOMY FEMORAL WITH CORMATRIX PATCH Extubated: POD # 0 Physical exam: foot warm, wounds C/D/I Post-op wounds clean, dry, intact or healing well Pt. Ambulating, voiding and taking PO diet without difficulty. Pt pain controlled with PO pain meds. Labs as below Complications:none  Consults:    Significant Diagnostic Studies: CBC Lab Results  Component Value Date   WBC 9.2 09/09/2017   HGB 8.7 (L) 09/09/2017   HCT 26.3 (L) 09/09/2017   MCV 77.7 (L) 09/09/2017   PLT 374 09/09/2017    BMET    Component Value Date/Time   NA 137 09/09/2017 0008   K 3.5 09/09/2017 0008   CL 100 (L) 09/09/2017 0008   CO2 25 09/09/2017 0008   GLUCOSE 133 (H) 09/09/2017 0008   BUN 10 09/09/2017 0008   CREATININE 0.78 09/09/2017 0008   CALCIUM 8.7 (L) 09/09/2017 0008   GFRNONAA >60 09/09/2017 0008   GFRAA >60 09/09/2017 0008   COAG Lab Results  Component Value Date   INR 1.05 09/02/2017     Disposition:   Discharge to :Home Discharge Instructions    Call MD for:  redness, tenderness, or signs of infection (pain, swelling, bleeding, redness, odor or green/yellow discharge around incision site)    Complete by:  As directed    Call MD for:  severe or increased pain, loss or decreased feeling  in affected limb(s)    Complete by:  As directed    Call MD for:  temperature >100.5    Complete by:  As directed    Driving Restrictions    Complete by:  As directed    No driving for 5 days   Remove dressing in 48 hours    Complete by:  As directed    Replace dry gauze to her wound in the groin after surgical dressing removed   Resume previous diet    Complete by:  As directed      Allergies as of 09/10/2017   No Known Allergies     Medication List    TAKE these medications   amLODipine 10 MG tablet Commonly known as:  NORVASC Take 10 mg by mouth daily.   aspirin EC 81 MG tablet Take 81 mg by mouth daily.   atorvastatin 10 MG tablet Commonly known as:  LIPITOR Take 10 mg by mouth daily.   CAL-MAG-ZINC PO Take 1 tablet by mouth daily.   carvedilol 6.25 MG tablet Commonly known as:  COREG Take 6.25 mg by mouth at bedtime.  clopidogrel 75 MG tablet Commonly known as:  PLAVIX Take 1 tablet (75 mg total) by mouth daily with breakfast.   metFORMIN 1000 MG tablet Commonly known as:  GLUCOPHAGE Take 1,000 mg by mouth 2 (two) times daily with a meal.   pantoprazole 20 MG tablet Commonly known as:  PROTONIX Take 1 tablet (20 mg total) by mouth daily.   polyethylene glycol packet Commonly known as:  MIRALAX / GLYCOLAX Take 17 g by mouth daily as needed (for constipation.).   SANTYL ointment Generic drug:  collagenase Apply 1 application topically daily. Applied to wound of leg and dress wound   traMADol 50 MG tablet Commonly known as:  ULTRAM Take 1 tablet (50 mg total) by mouth every 6 (six) hours as needed for moderate pain.   valsartan-hydrochlorothiazide 320-25 MG  tablet Commonly known as:  DIOVAN-HCT Take 1 tablet by mouth daily.   VITAMIN D3 GUMMIES ADULT PO Take 1 tablet by mouth daily.            Durable Medical Equipment        Start     Ordered   09/10/17 1157  For home use only DME Walker rolling  Once    Question:  Patient needs a walker to treat with the following condition  Answer:  Atherosclerosis of artery of extremity with ulceration ([place])   09/10/17 1157     Verbal and written Discharge instructions given to the patient. Wound care per Discharge AVS Follow-up Information    Stegmayer, Janalyn Harder, PA-C On 09/20/2017.   Specialty:  Physician Assistant Why:  for wound check;  @ 2:00 pm Contact information: [place] [address] [phone]           Signed: Leotis Pain, MD  09/10/2017, 3:37 PM

## 2017-09-10 NOTE — Progress Notes (Signed)
[facility]  Daily Progress Note   Subjective  - 2 Days Post-Op  Doing very well Granddaughter says she is walking better than before surgery  Objective Vitals:   09/09/17 1300 09/09/17 1400 09/09/17 1734 09/09/17 2204  BP: 119/62 130/61 (!) 127/59 (!) 145/66  Pulse:   81 85  Resp: (!) 26 16 20 20   Temp:   (!) 97.5 F (36.4 C) 97.6 F (36.4 C)  TempSrc:   Oral Oral  SpO2:   100% 100%  Weight:      Height:       No intake or output data in the 24 hours ending 09/10/17 0913  PULM  CTAB CV  RRR VASC  Foot warm, 1+ pulse  Laboratory CBC    Component Value Date/Time   WBC 9.2 09/09/2017 0008   HGB 8.7 (L) 09/09/2017 0008   HCT 26.3 (L) 09/09/2017 0008   PLT 374 09/09/2017 0008    BMET    Component Value Date/Time   NA 137 09/09/2017 0008   K 3.5 09/09/2017 0008   CL 100 (L) 09/09/2017 0008   CO2 25 09/09/2017 0008   GLUCOSE 133 (H) 09/09/2017 0008   BUN 10 09/09/2017 0008   CREATININE 0.78 09/09/2017 0008   CALCIUM 8.7 (L) 09/09/2017 0008   GFRNONAA >60 09/09/2017 0008   GFRAA >60 09/09/2017 0008    Assessment/Planning: POD #2 s/p left femoral endarterectomy    Home today on ASA/Plavix  RTC two weeks for wound check    [name]  09/10/2017, 9:13 AM

## 2017-09-13 ENCOUNTER — Encounter: Payer: Self-pay | Admitting: Vascular Surgery

## 2017-09-17 ENCOUNTER — Encounter: Payer: Medicare Other | Attending: Surgery | Admitting: Surgery

## 2017-09-17 ENCOUNTER — Encounter (HOSPITAL_COMMUNITY): Payer: Self-pay

## 2017-09-17 ENCOUNTER — Emergency Department (HOSPITAL_COMMUNITY)
Admission: EM | Admit: 2017-09-17 | Discharge: 2017-09-18 | Disposition: A | Discharge status: Home / Self Care | Payer: Medicare Other | Attending: Emergency Medicine | Admitting: Emergency Medicine

## 2017-09-17 DIAGNOSIS — L97222 Non-pressure chronic ulcer of left calf with fat layer exposed: Secondary | ICD-10-CM | POA: Insufficient documentation

## 2017-09-17 DIAGNOSIS — E11622 Type 2 diabetes mellitus with other skin ulcer: Secondary | ICD-10-CM | POA: Diagnosis not present

## 2017-09-17 DIAGNOSIS — E119 Type 2 diabetes mellitus without complications: Secondary | ICD-10-CM | POA: Diagnosis not present

## 2017-09-17 DIAGNOSIS — Z7984 Long term (current) use of oral hypoglycemic drugs: Secondary | ICD-10-CM | POA: Insufficient documentation

## 2017-09-17 DIAGNOSIS — Y929 Unspecified place or not applicable: Secondary | ICD-10-CM | POA: Insufficient documentation

## 2017-09-17 DIAGNOSIS — I70242 Atherosclerosis of native arteries of left leg with ulceration of calf: Secondary | ICD-10-CM | POA: Diagnosis not present

## 2017-09-17 DIAGNOSIS — Y33XXXA Other specified events, undetermined intent, initial encounter: Secondary | ICD-10-CM | POA: Insufficient documentation

## 2017-09-17 DIAGNOSIS — D649 Anemia, unspecified: Secondary | ICD-10-CM | POA: Insufficient documentation

## 2017-09-17 DIAGNOSIS — Z7902 Long term (current) use of antithrombotics/antiplatelets: Secondary | ICD-10-CM | POA: Insufficient documentation

## 2017-09-17 DIAGNOSIS — Z79899 Other long term (current) drug therapy: Secondary | ICD-10-CM | POA: Diagnosis not present

## 2017-09-17 DIAGNOSIS — Z5189 Encounter for other specified aftercare: Secondary | ICD-10-CM | POA: Diagnosis not present

## 2017-09-17 DIAGNOSIS — K5900 Constipation, unspecified: Secondary | ICD-10-CM | POA: Diagnosis not present

## 2017-09-17 DIAGNOSIS — Y939 Activity, unspecified: Secondary | ICD-10-CM | POA: Diagnosis not present

## 2017-09-17 DIAGNOSIS — T148XXA Other injury of unspecified body region, initial encounter: Secondary | ICD-10-CM | POA: Diagnosis not present

## 2017-09-17 DIAGNOSIS — I2510 Atherosclerotic heart disease of native coronary artery without angina pectoris: Secondary | ICD-10-CM | POA: Diagnosis not present

## 2017-09-17 DIAGNOSIS — I10 Essential (primary) hypertension: Secondary | ICD-10-CM | POA: Diagnosis not present

## 2017-09-17 DIAGNOSIS — L97929 Non-pressure chronic ulcer of unspecified part of left lower leg with unspecified severity: Secondary | ICD-10-CM | POA: Diagnosis not present

## 2017-09-17 DIAGNOSIS — Y999 Unspecified external cause status: Secondary | ICD-10-CM | POA: Insufficient documentation

## 2017-09-17 DIAGNOSIS — M1990 Unspecified osteoarthritis, unspecified site: Secondary | ICD-10-CM | POA: Diagnosis not present

## 2017-09-17 DIAGNOSIS — Z7982 Long term (current) use of aspirin: Secondary | ICD-10-CM | POA: Diagnosis not present

## 2017-09-17 NOTE — ED Triage Notes (Addendum)
Pt c/o wound to L shin. She has been seeing the wound care center and had it cleaned and dressed today, but has been unable to control the bleeding. She also endorses some lightheadedness. A&Ox4. Ambulatory with assistance. Denies chest pain, but states that she is occasionally SOB.

## 2017-09-18 DIAGNOSIS — Z5189 Encounter for other specified aftercare: Secondary | ICD-10-CM | POA: Diagnosis not present

## 2017-09-18 MED ORDER — "THROMBI-PAD 3""X3"" EX PADS"
1.0000 | MEDICATED_PAD | Freq: Once | CUTANEOUS | Status: AC
  Administered 2017-09-18: 1 via TOPICAL
  Filled 2017-09-18: qty 1

## 2017-09-18 NOTE — Discharge Instructions (Signed)
Leave the thrombi pad in place until it falls off. Recheck if you have bleeding that is not controlled again.

## 2017-09-18 NOTE — ED Provider Notes (Signed)
[facility] DEPT Provider Note   CSN: [id number] Arrival date & time: 09/17/17  2340  Time seen 01:25 AM   History   Chief Complaint Chief Complaint  Patient presents with  . Wound Check    HPI [name] is a 83 y.o. female.  HPI  Patient reports she has had a chronic ulcer on her left lower leg since about June. She had a procedure done last week, October 10 where she had endarterectomy done of her left common femoral, superficial femoral and profundus femoris artery with corematrix patch angioplasty. She has been going to the wound center since about July, she goes weekly. She was seen there earlier today, October 19 about 2 PM. She states they did some scraping on her woundtoday. She states tonight the wound was bleeding and it soaked through the cause wrap that they had on it.overall however she feels like the wound is improving.  PCP Lucianne Lei, MD   Past Medical History:  Diagnosis Date  . Anemia    family unsure of this history  . Arthritis   . Chronic ulcer of calf (HCC)    Left  . Constipation   . Diabetes mellitus without complication ([place])   . Dyslipidemia   . GERD (gastroesophageal reflux disease)   . Hypertension   . Peripheral vascular disease [facility])     Patient Active Problem List   Diagnosis Date Noted  . Atherosclerosis of artery of extremity with ulceration ([place]) 09/08/2017  . Atherosclerosis of native arteries of the extremities with ulceration ([place]) 06/25/2017  . Stroke ([place]) 12/31/2013  . TIA (transient ischemic attack) 12/31/2013  . Acute ischemic stroke ([place]) 12/31/2013  . ARF (acute renal failure) ([place]) 12/31/2013  . Hypertension   . Diabetes mellitus without complication ([place])   . Dyslipidemia   . Constipation   . Anemia     Past Surgical History:  Procedure Laterality Date  . ABDOMINAL HYSTERECTOMY    . ENDARTERECTOMY FEMORAL Left 09/08/2017   Procedure: ENDARTERECTOMY FEMORAL WITH CORMATRIX  PATCH;  Surgeon: Algernon Huxley, MD;  Location: [facility] ORS;  Service: Vascular;  Laterality: Left;  . EYE SURGERY Bilateral    Cataract Extraction with IOL  . LOWER EXTREMITY ANGIOGRAPHY Left 07/22/2017   Procedure: Lower Extremity Angiography;  Surgeon: Algernon Huxley, MD;  Location: [facility];  Service: Cardiovascular;  Laterality: Left;  . LOWER EXTREMITY ANGIOGRAPHY Left 08/16/2017   Procedure: Lower Extremity Angiography;  Surgeon: Algernon Huxley, MD;  Location: [facility];  Service: Cardiovascular;  Laterality: Left;  Marland Kitchen MULTIPLE TOOTH EXTRACTIONS      OB History    No data available       Home Medications    Prior to Admission medications   Medication Sig Start Date End Date Taking? Authorizing Provider  amLODipine (NORVASC) 10 MG tablet Take 10 mg by mouth daily.    [provider]  aspirin EC 81 MG tablet Take 81 mg by mouth daily.    [provider]  atorvastatin (LIPITOR) 10 MG tablet Take 10 mg by mouth daily.    [provider]  Calcium-Magnesium-Zinc (CAL-MAG-ZINC PO) Take 1 tablet by mouth daily.    [provider]  carvedilol (COREG) 6.25 MG tablet Take 6.25 mg by mouth at bedtime.    [provider]  Cholecalciferol (VITAMIN D3 GUMMIES ADULT PO) Take 1 tablet by mouth daily.    [provider]  clopidogrel (PLAVIX) 75 MG tablet  Take 1 tablet (75 mg total) by mouth daily with breakfast. 01/01/14   Barton Dubois, MD  metFORMIN (GLUCOPHAGE) 1000 MG tablet Take 1,000 mg by mouth 2 (two) times daily with a meal.    [provider]  pantoprazole (PROTONIX) 20 MG tablet Take 1 tablet (20 mg total) by mouth daily. 07/13/14   Billy Fischer, MD  polyethylene glycol (MIRALAX / [name]) packet Take 17 g by mouth daily as needed (for constipation.).     [provider]  SANTYL ointment Apply 1 application topically daily. Applied to wound of leg and dress wound 06/14/17   [provider]    traMADol (ULTRAM) 50 MG tablet Take 1 tablet (50 mg total) by mouth every 6 (six) hours as needed for moderate pain. 09/10/17   Algernon Huxley, MD  valsartan-hydrochlorothiazide (DIOVAN-HCT) 320-25 MG tablet Take 1 tablet by mouth daily. 08/05/17   [provider]    Family History History reviewed. No pertinent family history.  Social History Social History  Substance Use Topics  . Smoking status: Never Smoker  . Smokeless tobacco: Never Used  . Alcohol use No  lives at home   Allergies   Patient has no known allergies.   Review of Systems Review of Systems  All other systems reviewed and are negative.    Physical Exam Updated Vital Signs BP (!) 156/70   Pulse (!) 51   Temp 97.7 F (36.5 C) (Oral)   Resp 18   SpO2 99%   Vital signs normal Except hypertension and bradycardia   Physical Exam  Constitutional: She is oriented to person, place, and time. She appears well-developed and well-nourished.  HENT:  Head: Normocephalic and atraumatic.  Right Ear: External ear normal.  Left Ear: External ear normal.  Nose: Nose normal.  Eyes: Conjunctivae and EOM are normal.  Neck: Normal range of motion.  Cardiovascular: Bradycardia present.   Pulmonary/Chest: Effort normal. No respiratory distress.  Musculoskeletal: Normal range of motion. She exhibits no edema or deformity.  Neurological: She is alert and oriented to person, place, and time. No cranial nerve deficit.  Skin: Skin is warm and dry. No erythema.  Patient has a ulcer on the anterior portion of her left lower extremity that measures about 3.5 x 8 cm with exposed subcutaneous tissue. There is some areas that look like they may have been bleeding however they are not actively bleeding at this time.  Psychiatric: She has a normal mood and affect. Her behavior is normal. Thought content normal.       ED Treatments / Results  Labs (all labs ordered are listed, but only abnormal results are  displayed) Labs Reviewed - No data to display  EKG  EKG Interpretation None       Radiology No results found.  Procedures Procedures (including critical care time)  Medications Ordered in ED Medications  THROMBI-PAD 3"X3" pad 1 each (not administered)     Initial Impression / Assessment and Plan / ED Course  I have reviewed the triage vital signs and the nursing notes.  Pertinent labs & imaging results that were available during my care of the patient were reviewed by me and considered in my medical decision making (see chart for details).     A thrombi pad was placed over her area, she was advised to change the gauze dressing over it daily however she should not remove the thrombi pad until it falls off. She has another appointment at the wound center in  1 week, she is encouraged to keep that appointment.  Final Clinical Impressions(s) / ED Diagnoses   Final diagnoses:  Visit for wound check  Bleeding from wound   Plan discharge  Rolland Porter, MD, Barbette Or, MD 09/18/17 [phone]

## 2017-09-18 NOTE — ED Notes (Signed)
Wound undressed and evaluated. Wound is not actively bleeding, no signs of redness, swelling or warmth to the wound.

## 2017-09-18 NOTE — ED Notes (Signed)
Patient states she is not dizzy

## 2017-09-20 ENCOUNTER — Encounter (INDEPENDENT_AMBULATORY_CARE_PROVIDER_SITE_OTHER): Payer: Self-pay | Admitting: Vascular Surgery

## 2017-09-20 ENCOUNTER — Ambulatory Visit (INDEPENDENT_AMBULATORY_CARE_PROVIDER_SITE_OTHER): Payer: Medicare Other | Admitting: Vascular Surgery

## 2017-09-20 ENCOUNTER — Other Ambulatory Visit (INDEPENDENT_AMBULATORY_CARE_PROVIDER_SITE_OTHER): Payer: Self-pay | Admitting: Vascular Surgery

## 2017-09-20 ENCOUNTER — Other Ambulatory Visit (INDEPENDENT_AMBULATORY_CARE_PROVIDER_SITE_OTHER): Payer: Medicare Other

## 2017-09-20 VITALS — BP 141/77 | HR 112 | Resp 15 | Ht 65.5 in | Wt 134.0 lb

## 2017-09-20 DIAGNOSIS — I7025 Atherosclerosis of native arteries of other extremities with ulceration: Secondary | ICD-10-CM

## 2017-09-20 DIAGNOSIS — I739 Peripheral vascular disease, unspecified: Secondary | ICD-10-CM

## 2017-09-20 DIAGNOSIS — E119 Type 2 diabetes mellitus without complications: Secondary | ICD-10-CM

## 2017-09-20 NOTE — Progress Notes (Addendum)
LYFE, REIHL ([id number]) Visit Report for 09/17/2017 Chief Complaint Document Details Patient Name: [name], [name] Date of Service: 09/17/2017 2:15 PM Medical Record Number: [id number] Patient Account Number: [id number] Date of Birth/Sex: 1926-08-02 (83 y.o. Female) Treating RN: Carolyne Fiscal, Debi Primary Care Provider: Lucianne Lei Other Clinician: Referring Provider: Lucianne Lei Treating Provider/Extender: Frann Rider in Treatment: 16 Information Obtained from: Patient Chief Complaint Patients presents for treatment of an open diabetic ulcer 2 the left lower anterior leg which she sustained an injury by blunt trauma Electronic Signature(s) Signed: 09/17/2017 3:05:49 PM By: Christin Fudge MD, FACS Entered By: Christin Fudge on 09/17/2017 15:05:48 [name], [name] ([id number]) -------------------------------------------------------------------------------- Debridement Details Patient Name: Alexa Lowes P. Date of Service: 09/17/2017 2:15 PM Medical Record Number: [id number] Patient Account Number: [id number] Date of Birth/Sex: 13-Oct-1926 (83 y.o. Female) Treating RN: Carolyne Fiscal, Debi Primary Care Provider: Lucianne Lei Other Clinician: Referring Provider: Lucianne Lei Treating Provider/Extender: Frann Rider in Treatment: 16 Debridement Performed for Wound #1 Left,Anterior Lower Leg Assessment: Performed By: Physician Christin Fudge, MD Debridement: Debridement Severity of Tissue Pre Fat layer exposed Debridement: Pre-procedure Verification/Time Yes - 14:54 Out Taken: Start Time: 14:55 Pain Control: Lidocaine 4% Topical Solution Level: Skin/Subcutaneous Tissue Total Area Debrided (L x W): 7.9 (cm) x 3.3 (cm) = 26.07 (cm) Tissue and other material Viable, Non-Viable, Exudate, Fibrin/Slough, Subcutaneous debrided: Instrument: Forceps, Scissors Bleeding: Minimum Hemostasis Achieved: Pressure End Time: 14:59 Procedural Pain: 0 Post  Procedural Pain: 0 Response to Treatment: Procedure was tolerated well Post Debridement Measurements of Total Wound Length: (cm) 7.9 Width: (cm) 3.4 Depth: (cm) 0.2 Volume: (cm) 4.219 Character of Wound/Ulcer Post Debridement: Requires Further Debridement Severity of Tissue Post Debridement: Fat layer exposed Post Procedure Diagnosis Same as Pre-procedure Electronic Signature(s) Signed: 09/17/2017 3:05:37 PM By: Christin Fudge MD, FACS Signed: 09/17/2017 4:20:42 PM By: Alric Quan Previous Signature: 09/17/2017 3:05:14 PM Version By: Christin Fudge MD, FACS Entered By: Christin Fudge on 09/17/2017 15:05:37 [name], [name] ([id number]) -------------------------------------------------------------------------------- HPI Details Patient Name: Alexa Lowes P. Date of Service: 09/17/2017 2:15 PM Medical Record Number: [id number] Patient Account Number: [id number] Date of Birth/Sex: 07-09-1926 (83 y.o. Female) Treating RN: Carolyne Fiscal, Debi Primary Care Provider: Lucianne Lei Other Clinician: Referring Provider: Lucianne Lei Treating Provider/Extender: Frann Rider in Treatment: 16 History of Present Illness Location: left anterior shin Quality: Patient reports experiencing a dull pain to affected area(s). Severity: Patient states wound are getting worse. Duration: Patient has had the wound for < 3 weeks prior to presenting for treatment Timing: Pain in wound is Intermittent (comes and goes Context: The wound occurred when the patient blunt trauma against her left shin Modifying Factors: Other treatment(s) tried include:active Bactroban ointment locally Associated Signs and Symptoms: Patient reports having: occasional discharge from the wound HPI Description: 83 year old patient was seen in the ED about 10 days ago for a history of abrasion to the left lower extremity while she was boarding a bus and scraped the anterior part of her left leg. She has been a diabetic for  many years and has been taking treatment regularly. Her past medical history is also significant for anemia arthritis constipation and hypertension and is status post abdominal hysterectomy. She has never been a smoker. after her ER visit she was advised to apply Bactroban ointment to the wound twice a day and continue to monitor her blood glucose levels. her last hemoglobin A1c was done 3 years ago and was 6.6% 06/04/17 patient left anterior shin wound appears to be doing well although  it is still somewhat dry despite the treatment with Medihoney. We have been using Kerlex following the Medihoney application and I believe this is just not retaining that much moisture. Nonetheless there is no evidence of infection. 06/11/17 on evaluation today patient appears to be doing a little bit worse in regard to her wound. The entirety of the wound is dry and unfortunately the Medihoney does not seem to be helping this. That is even with the dressing changes that I made last week to try to retain more moisture. She has not tried [employer] as of yet. She was also noncompressible when testing for ABIs. 07/01/2017 -- she had a arterial studies done and was seen by Dr. Lucky Cowboy. her noninvasive studies showed noncompressible vessels on the right but brisk waveforms and digital pressures of 71 on the right consistent with only mild arterial insufficienc. On the left her ABI was 0.47 and this may be falsely elevated due to calcification. No digital pressures were obtained on the left and this is consistent with severe arterial insufficiency. this critical limb threatening situation on the left, would make wound healing difficult and it represents a serious situation and he has recommended an angiography with possible revascularization. The patient desired to defer the decision to she discuss with the family members. 07/09/17 on evaluation today patient appears to be doing fairly well in regard to her left anterior lower  extremity wound. She has been tolerating the dressing changes without complication we have been utilizing Santyl at this time. She tells me that she is having no significant pain compared to what she has had in the past. She has decided after discussing with family that she is going to go forward with the surgery with Dr. dew. This is to restore better blood flow to left lower extremity. With that it does appear that her lower extremity wound is making some progress albeit slow. No fevers, chills, nausea, or vomiting noted at this time. 07/16/17 on evaluation today patient's wound appears to be doing roughly about the same although some of the slough is clearing off. [name] she has her appointment with vascular next week on Thursday the 23rd. With that being said her wound does not appear to be hurting as badly as it has in the past which is good news. No fevers, chills, nausea, or vomiting noted at this time. 07/30/2017 -- she had surgery on 07/22/2017 --indications being nonhealing ulcer on the left leg with a noninvasive study showing marked reduction in ABI of less than 0.5 and no digital pressure on her left leg. she had a percutaneous transluminal angioplasty of the left common and external iliac arteries. She then had a stent placed to the left external iliac arteries and to the left common iliac artery. the left lower extremity showed occlusion of the common femoral artery and the origins of the Corvino, Tiyona P. ([id number]) SFA and the profunda femoris artery. The flow distally was poor and there were multiple areas of high-grade stenosis or occlusion of the distal SFA and the popliteal artery with only a diseased peroneal artery as runoff distally. She has a postop appointment on September 23 08/20/2017 -- she went back to the vascular office for an appointment on 08/12/2017 and bilateral ABIs were done which were notable for moderate right lower extremity arterial disease and unable to  obtain left ankle brachial indices due to absent Doppler velocities in the posterior tibial artery and the anterior tibial artery. Monophasic waveforms to the left femoral and popliteal  artery. this was worrisome for a failure of stents placed and lower extremity angiogram was recommended. as was done on 08/16/2017 and the stents placed in the left eye Lex system had some mild narrowing at the iliac bifurcation and the leading edge of the stent proximally was poor wall apposition with stenosis in the 50-60% range in the right common iliac artery. There was also occlusion of the common femoral artery and the origin of the SFA and profunda femoris artery. Flow distally was poor but they appeared to be multiple areas of high-grade stenosis or occlusion in the distal SFA and popliteal artery with only a diseased peroneal artery. in another stent was placed and deployed. The femoral occlusion would need to be treated surgically. 08/27/17 on evaluation today patient appears to be doing about the same in regard to her left for extremity wound. Unfortunately secondary to her vascular status this is not a very well healing wound at this time. She has been tolerating the dressing changes she has some discomfort but mainly with cleansing of the wound not at other times. No fevers, chills, nausea, or vomiting noted at this time. 09/17/2017 -- the patient was recently discharged from the hospital where she was admitted between 09/08/2017 and 09/10/2017, when she had a procedure on her left femoral artery with the endarterectomy and a patch. she will be going back for review this coming week Electronic Signature(s) Signed: 09/17/2017 3:06:08 PM By: Christin Fudge MD, FACS Previous Signature: 09/17/2017 3:02:52 PM Version By: Christin Fudge MD, FACS Previous Signature: 09/17/2017 2:49:11 PM Version By: Christin Fudge MD, FACS Entered By: Christin Fudge on 09/17/2017 15:06:08 [name], [name]  ([id number]) -------------------------------------------------------------------------------- Physical Exam Details Patient Name: Alexa Lowes P. Date of Service: 09/17/2017 2:15 PM Medical Record Number: [id number] Patient Account Number: [id number] Date of Birth/Sex: 16-Jan-1926 (83 y.o. Female) Treating RN: Carolyne Fiscal, Debi Primary Care Provider: Lucianne Lei Other Clinician: Referring Provider: Lucianne Lei Treating Provider/Extender: Frann Rider in Treatment: 16 Constitutional . Pulse regular. Respirations normal and unlabored. Afebrile. . Eyes Nonicteric. Reactive to light. Ears, Nose, Mouth, and Throat Lips, teeth, and gums WNL.Marland Kitchen Moist mucosa without lesions. Neck supple and nontender. No palpable supraclavicular or cervical adenopathy. Normal sized without goiter. Respiratory WNL. No retractions.. Cardiovascular Pedal Pulses WNL. No clubbing, cyanosis or edema. Lymphatic No adneopathy. No adenopathy. No adenopathy. Musculoskeletal Adexa without tenderness or enlargement.. Digits and nails w/o clubbing, cyanosis, infection, petechiae, ischemia, or inflammatory conditions.. Integumentary (Hair, Skin) No suspicious lesions. No crepitus or fluctuance. No peri-wound warmth or erythema. No masses.Marland Kitchen Psychiatric Judgement and insight Intact.. No evidence of depression, anxiety, or agitation.. Notes the patient had a lot of necrotic debris at the base of the ulcer and with meticulous dissection with forceps and scissors I was able to remove a lot of this all over the wound. No significant bleeding. Electronic Signature(s) Signed: 09/17/2017 3:06:53 PM By: Christin Fudge MD, FACS Entered By: Christin Fudge on 09/17/2017 15:06:53 [name] ([id number]) -------------------------------------------------------------------------------- Physician Orders Details Patient Name: Alexa Lowes P. Date of Service: 09/17/2017 2:15 PM Medical Record Number:  [id number] Patient Account Number: [id number] Date of Birth/Sex: [birth year] (83 y.o. Female) Treating RN: Carolyne Fiscal, Debi Primary Care Provider: Lucianne Lei Other Clinician: Referring Provider: Lucianne Lei Treating Provider/Extender: Frann Rider in Treatment: 16 Verbal / Phone Orders: Yes Clinician: Carolyne Fiscal, Debi Read Back and Verified: Yes Diagnosis Coding Wound Cleansing Wound #1 Left,Anterior Lower Leg o Clean wound with Normal Saline. o May Shower, gently pat wound dry prior to  applying new dressing. Anesthetic Wound #1 Left,Anterior Lower Leg o Topical Lidocaine 4% cream applied to wound bed prior to debridement Primary Wound Dressing Wound #1 Left,Anterior Lower Leg o Santyl Ointment Secondary Dressing Wound #1 Left,Anterior Lower Leg o Boardered Foam Dressing o Non-adherent pad o Other - stretch netting #4 Dressing Change Frequency Wound #1 Left,Anterior Lower Leg o Change dressing every day. Follow-up Appointments Wound #1 Left,Anterior Lower Leg o Return Appointment in 1 week. Edema Control Wound #1 Left,Anterior Lower Leg o Elevate legs to the level of the heart and pump ankles as often as possible Additional Orders / Instructions Wound #1 Left,Anterior Lower Leg o Increase protein intake. o Other: - Please add vitamin A, vitamin C and zinc supplements to your diet Electronic Signature(s) Signed: 09/17/2017 4:07:58 PM By: Christin Fudge MD, FACS Signed: 09/17/2017 4:20:42 PM By: Gerome Apley, [name] ([id number]) Entered By: Alric Quan on 09/17/2017 15:00:26 Dalporto, [name] ([id number]) -------------------------------------------------------------------------------- Problem List Details Patient Name: [name], [name] Date of Service: 09/17/2017 2:15 PM Medical Record Number: [id number] Patient Account Number: [id number] Date of Birth/Sex: 1926-03-26 (83 y.o. 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 ([id number]) -------------------------------------------------------------------------------- Progress Note Details Patient Name: Alexa Lowes P. Date of Service: 09/17/2017 2:15 PM Medical Record Number: [id number] Patient Account Number: [id number] Date of Birth/Sex: 1926/04/23 (83 y.o. Female) Treating RN: Carolyne Fiscal, Debi Primary Care Provider: Lucianne Lei Other Clinician: Referring Provider: Lucianne Lei Treating Provider/Extender: Frann Rider in Treatment: 16 Subjective Chief Complaint Information obtained from Patient Patients presents for treatment of an open diabetic ulcer 2 the left lower anterior leg which she sustained an injury by blunt trauma History of Present Illness (HPI) The following HPI elements were documented for the patient's wound: Location: left anterior shin Quality: Patient reports experiencing a dull pain to affected area(s). Severity: Patient states wound are getting worse. Duration: Patient has had the wound for < 3 weeks prior to presenting for treatment Timing: Pain in wound is Intermittent (comes and goes Context: The wound occurred when the patient blunt trauma against her left shin Modifying Factors: Other treatment(s) tried include:active Bactroban ointment locally Associated  Signs and Symptoms: Patient reports having: occasional discharge from the wound 83 year old patient was seen in the ED about 10 days ago for a history of abrasion to the left lower extremity while she was boarding a bus and scraped the anterior part of her left leg. She has been a diabetic for many years and has been taking treatment regularly. Her past medical history is also significant for anemia arthritis constipation and hypertension and is status post abdominal hysterectomy. She has never been a smoker. after her ER visit she was advised to apply Bactroban ointment to the wound twice a day and continue to monitor her blood glucose levels. her last hemoglobin A1c was done 3 years ago and was 6.6% 06/04/17 patient left anterior shin wound appears to be doing well although it is still somewhat dry despite the treatment with  Medihoney. We have been using Kerlex following the Medihoney application and I believe this is just not retaining that much moisture. Nonetheless there is no evidence of infection. 06/11/17 on evaluation today patient appears to be doing a little bit worse in regard to her wound. The entirety of the wound is dry and unfortunately the Medihoney does not seem to be helping this. That is even with the dressing changes that I made last week to try to retain more moisture. She has not tried [employer] as of yet. She was also noncompressible when testing for ABIs. 07/01/2017 -- she had a arterial studies done and was seen by Dr. Lucky Cowboy. her noninvasive studies showed noncompressible vessels on the right but brisk waveforms and digital pressures of 71 on the right consistent with only mild arterial insufficienc. On the left her ABI was 0.47 and this may be falsely elevated due to calcification. No digital pressures were obtained on the left and this is consistent with severe arterial insufficiency. this critical limb threatening situation on the left, would make wound healing difficult and  it represents a serious situation and he has recommended an angiography with possible revascularization. The patient desired to defer the decision to she discuss with the family members. 07/09/17 on evaluation today patient appears to be doing fairly well in regard to her left anterior lower extremity wound. She has been tolerating the dressing changes without complication we have been utilizing Santyl at this time. She tells me that she is having no significant pain compared to what she has had in the past. She has decided after discussing with family that she is going to go forward with the surgery with Dr. dew. This is to restore better blood flow to left lower extremity. With that it does appear that her lower extremity wound is making some progress albeit slow. No fevers, chills, nausea, or vomiting noted at this time. [name], [name] ([id number]) 07/16/17 on evaluation today patient's wound appears to be doing roughly about the same although some of the slough is clearing off. [name] she has her appointment with vascular next week on Thursday the 23rd. With that being said her wound does not appear to be hurting as badly as it has in the past which is good news. No fevers, chills, nausea, or vomiting noted at this time. 07/30/2017 -- she had surgery on 07/22/2017 --indications being nonhealing ulcer on the left leg with a noninvasive study showing marked reduction in ABI of less than 0.5 and no digital pressure on her left leg. she had a percutaneous transluminal angioplasty of the left common and external iliac arteries. She then had a stent placed to the left external iliac arteries and to the left common iliac artery. 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 The flow distally was poor and there were multiple areas of high-grade stenosis or occlusion of the distal SFA and the popliteal artery with only  a diseased peroneal artery as runoff distally. She has a postop appointment on September 23 08/20/2017 -- she went back to the vascular office for an appointment on 08/12/2017 and bilateral ABIs were done which were notable for moderate right lower extremity arterial disease and unable to obtain left ankle brachial indices due to absent Doppler velocities in the posterior tibial artery and the anterior tibial artery. Monophasic waveforms to the left femoral and popliteal artery. this was worrisome for a failure of stents  placed and lower extremity angiogram was recommended. as was done on 08/16/2017 and the stents placed in the left eye Lex system had some mild narrowing at the iliac bifurcation and the leading edge of the stent proximally was poor wall apposition with stenosis in the 50-60% range in the right common iliac artery. There was also occlusion of the common femoral artery and the origin of the SFA and profunda femoris artery. Flow distally was poor but they appeared to be multiple areas of high-grade stenosis or occlusion in the distal SFA and popliteal artery with only a diseased peroneal artery. in another stent was placed and deployed. The femoral occlusion would need to be treated surgically. 08/27/17 on evaluation today patient appears to be doing about the same in regard to her left for extremity wound. Unfortunately secondary to her vascular status this is not a very well healing wound at this time. She has been tolerating the dressing changes she has some discomfort but mainly with cleansing of the wound not at other times. No fevers, chills, nausea, or vomiting noted at this time. 09/17/2017 -- the patient was recently discharged from the hospital where she was admitted between 09/08/2017 and 09/10/2017, when she had a procedure on her left femoral artery with the endarterectomy and a patch. she will be going back for review this coming week Objective Constitutional Pulse  regular. Respirations normal and unlabored. Afebrile. Vitals Time Taken: 2:42 PM, Height: 65 in, Weight: 131 lbs, BMI: 21.8, Temperature: 97.6 F, Pulse: 79 bpm, Respiratory Rate: 16 breaths/min, Blood Pressure: 137/65 mmHg. Eyes Nonicteric. Reactive to light. Ears, Nose, Mouth, and Throat Lips, teeth, and gums WNL.Marland Kitchen Moist mucosa without lesions. Neck supple and nontender. No palpable supraclavicular or cervical adenopathy. Normal sized without goiter. Montas, Orion P. ([id number]) Respiratory WNL. No retractions.. Cardiovascular Pedal Pulses WNL. No clubbing, cyanosis or edema. Lymphatic No adneopathy. No adenopathy. No adenopathy. Musculoskeletal Adexa without tenderness or enlargement.. Digits and nails w/o clubbing, cyanosis, infection, petechiae, ischemia, or inflammatory conditions.Marland Kitchen Psychiatric Judgement and insight Intact.. No evidence of depression, anxiety, or agitation.. General Notes: the patient had a lot of necrotic debris at the base of the ulcer and with meticulous dissection with forceps and scissors I was able to remove a lot of this all over the wound. No significant bleeding. Integumentary (Hair, Skin) No suspicious lesions. No crepitus or fluctuance. No peri-wound warmth or erythema. No masses.. Wound #1 status is Open. Original cause of wound was Trauma. The wound is located on the Left,Anterior Lower Leg. The wound measures 7.9cm length x 3.3cm width x 0.1cm depth; 20.475cm^2 area and 2.048cm^3 volume. There is Fat Layer (Subcutaneous Tissue) Exposed exposed. There is no tunneling or undermining noted. There is a large amount of serous drainage noted. The wound margin is flat and intact. There is no granulation within the wound bed. There is a large (67-100%) amount of necrotic tissue within the wound bed including Eschar and Adherent Slough. The periwound skin appearance exhibited: Scarring, Hemosiderin Staining, Erythema. The periwound skin appearance did  not exhibit: Callus, Crepitus, Excoriation, Induration, Rash, Dry/Scaly, Maceration, Atrophie Blanche, Cyanosis, Ecchymosis, Mottled, Pallor, Rubor. The surrounding wound skin color is noted with erythema which is circumferential. Periwound temperature was noted as No Abnormality. The periwound has tenderness on palpation. 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 There was a Skin/Subcutaneous Tissue Debridement (11042- 11047) debridement with total area of 26.07 sq cm performed by Christin Fudge, MD. with the following instrument(s): Forceps and Scissors to remove Viable and Non-Viable tissue/material including Exudate, Fibrin/Slough, and Subcutaneous after achieving pain control using Lidocaine 4% Topical Solution. A time out was conducted at 14:54, prior to the start of the [place] ([id number]) procedure. A Minimum amount of bleeding was controlled with Pressure. The procedure was tolerated well with a pain level of 0 throughout and a pain level of 0 following the procedure. Post Debridement Measurements: 7.9cm length x 3.4cm width x 0.2cm depth; 4.219cm^3 volume. Character of Wound/Ulcer Post Debridement requires further debridement. Severity of Tissue Post Debridement is: Fat layer exposed. Post procedure Diagnosis Wound #1: Same as Pre-Procedure Plan Wound Cleansing: Wound #1 Left,Anterior Lower Leg: Clean wound with Normal Saline. May Shower, gently pat wound dry prior to applying new dressing. Anesthetic: Wound #1 Left,Anterior Lower Leg: Topical Lidocaine 4% cream  applied to wound bed prior to debridement Primary Wound Dressing: Wound #1 Left,Anterior Lower Leg: Santyl Ointment Secondary Dressing: Wound #1 Left,Anterior Lower Leg: Boardered Foam Dressing Non-adherent pad Other - stretch netting #4 Dressing Change Frequency: Wound #1 Left,Anterior Lower Leg: Change dressing every day. Follow-up Appointments: Wound #1 Left,Anterior Lower Leg: Return Appointment in 1 week. Edema Control: Wound #1 Left,Anterior Lower Leg: Elevate legs to the level of the heart and pump ankles as often as possible Additional Orders / Instructions: Wound #1 Left,Anterior Lower Leg: Increase protein intake. Other: - Please add vitamin A, vitamin C and zinc supplements to your diet We have not seen her for about 3 weeks and during this time she has had surgery and I have reviewed her recent vascular procedure and after sharp debridement today, I have recommended: 1. Daily washing with soap and water and application of Santyl ointment locally with a light Kerlix dressing. 2. follow-up with the vascular surgeon for a possible arterial duplex study 3. good control of her diabetes mellitus 4. Adequate protein, vitamin A, vitamin C and zinc 5. Regular visits the wound center ALISA, STJAMES ([id number]) Electronic Signature(s) Signed: 09/17/2017 3:10:15 PM By: Christin Fudge MD, FACS Entered By: Christin Fudge on 09/17/2017 15:10:15 Pichon, [name] ([id number]) -------------------------------------------------------------------------------- SuperBill Details Patient Name: Alexa Lowes P. Date of Service: 09/17/2017 Medical Record Number: [id number] Patient Account Number: [id number] Date of Birth/Sex: 1926/07/02 (83 y.o. 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 Facility Procedures CPT4 Code: 27253664 Description: 40347 - DEB SUBQ TISSUE 20 SQ CM/< ICD-10 Diagnosis Description E11.622 Type 2 diabetes mellitus with other skin ulcer 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.242 Atherosclerosis of native arteries of left  leg with ulceration Modifier: of calf Quantity: 1 CPT4 Code: 42595638 Description: 11045 - DEB SUBQ TISS EA ADDL 20CM ICD-10 Diagnosis Description E11.622 Type 2 diabetes mellitus with other skin ulcer 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.242 Atherosclerosis of native arteries of  left  leg with ulceration Modifier: of calf Quantity: 1 Physician Procedures CPT4 Code: 4239532 Description: 02334 - WC PHYS LEVEL 3 - EST PT ICD-10 Diagnosis Description E11.622 Type 2 diabetes mellitus with other skin ulcer 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.242 Atherosclerosis of native arteries of left  leg with ulceration o Modifier: 25 f calf Quantity: 1 CPT4 Code: 3568616 Description: 83729 - WC PHYS SUBQ TISS 20 SQ CM ICD-10 Diagnosis Description E11.622 Type 2 diabetes mellitus with other skin ulcer 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.242 Atherosclerosis of native arteries of left  leg with ulceration o Modifier: f calf Quantity: 1 CPT4 Code: 0211155 Donaway, GLENDO Description: 11045 - WC PHYS SUBQ TISS EA ADDL 20 CM ICD-10 Diagnosis Description E11.622 Type 2 diabetes mellitus with other skin ulcer 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.242 Atherosclerosis of native arteries of  left leg with ulceration o RA P. ([id number]) Modifier: f  calf Quantity: 1 Electronic Signature(s) Signed: 09/17/2017 3:10:37 PM By: Christin Fudge MD, FACS Entered By: Christin Fudge on 09/17/2017 15:10:37

## 2017-09-20 NOTE — Progress Notes (Signed)
[name], [name] ([id number]) Visit Report for 09/17/2017 Arrival Information Details Patient Name: [name], [name] Date of Service: 09/17/2017 2:15 PM Medical Record Number: [id number] Patient Account Number: [id number] Date of Birth/Sex: Jun 15, 1926 (83 y.o. Female) Treating RN: Carolyne Fiscal, Debi Primary Care Delquan Poucher: Lucianne Lei Other Clinician: Referring Makinsey Pepitone: Lucianne Lei Treating Shilo Pauwels/Extender: Frann Rider in Treatment: 16 Visit Information History Since Last Visit All ordered tests and consults were completed: No Patient Arrived: [name] Added or deleted any medications: No Arrival Time: 14:41 Any new allergies or adverse reactions: No Accompanied By: granddaughter Had a fall or experienced change in No Transfer Assistance: Easy[employer] Patient activities of daily living that may affect Lift risk of falls: Patient Identification Verified: Yes Signs or symptoms of abuse/neglect since last visito No Secondary Verification Process Yes Hospitalized since last visit: No Completed: Has Dressing in Place as Prescribed: Yes Patient Requires Transmission-Based No Precautions: Pain Present Now: No Patient Has Alerts: Yes Electronic Signature(s) Signed: 09/17/2017 4:20:42 PM By: Alric Quan Entered By: Alric Quan on 09/17/2017 14:42:49 [name], [name] ([id number]) -------------------------------------------------------------------------------- Encounter Discharge Information Details Patient Name: Alexa Lowes P. Date of Service: 09/17/2017 2:15 PM Medical Record Number: [id number] Patient Account Number: [id number] Date of Birth/Sex: 1926-04-21 (83 y.o. Female) Treating RN: Carolyne Fiscal, Debi Primary Care Marly Schuld: Lucianne Lei Other Clinician: Referring Kimberle Stanfill: Lucianne Lei Treating Clevie Prout/Extender: Frann Rider in Treatment: 16 Encounter Discharge Information Items Discharge Pain Level: 0 Discharge Condition: Stable Ambulatory  Status: Cane Discharge Destination: Home Transportation: Private Auto Accompanied By: granddaughter Schedule Follow-up Appointment: Yes Medication Reconciliation completed and No provided to Patient/Care Taite Baldassari: Provided on Clinical Summary of Care: 09/17/2017 Form Type Recipient Paper Patient GW Electronic Signature(s) Signed: 09/17/2017 4:01:43 PM By: Alric Quan Entered By: Alric Quan on 09/17/2017 16:01:43 Pharo, [name] ([id number]) -------------------------------------------------------------------------------- Lower Extremity Assessment Details Patient Name: Alexa Lowes P. Date of Service: 09/17/2017 2:15 PM Medical Record Number: [id number] Patient Account Number: [id number] Date of Birth/Sex: 1926/04/30 (83 y.o. Female) Treating RN: Carolyne Fiscal, Debi Primary Care Cordae Mccarey: Lucianne Lei Other Clinician: Referring Anaelle Dunton: Lucianne Lei Treating Ercelle Winkles/Extender: Frann Rider in Treatment: 16 Vascular Assessment Pulses: Dorsalis Pedis Palpable: [Left:No] Doppler Audible: [Left:Yes] Posterior Tibial Extremity colors, hair growth, and conditions: Extremity Color: [Left:Hyperpigmented] Temperature of Extremity: [Left:Warm] Capillary Refill: [Left:< 3 seconds] Toe Nail Assessment Left: Right: Thick: No Discolored: No Deformed: No Improper Length and Hygiene: No Electronic Signature(s) Signed: 09/17/2017 4:20:42 PM By: Alric Quan Entered By: Alric Quan on 09/17/2017 14:50:30 [name], [name] ([id number]) -------------------------------------------------------------------------------- Multi Wound Chart Details Patient Name: Alexa Lowes P. Date of Service: 09/17/2017 2:15 PM Medical Record Number: [id number] Patient Account Number: [id number] Date of Birth/Sex: 25-Nov-1926 (83 y.o. Female) Treating RN: Carolyne Fiscal, Debi Primary Care Raliyah Montella: Lucianne Lei Other Clinician: Referring Tameaka Eichhorn: Lucianne Lei Treating  Joffre Lucks/Extender: Frann Rider in Treatment: 16 Vital Signs Height(in): 65 Pulse(bpm): 57 Weight(lbs): 131 Blood Pressure(mmHg): 137/65 Body Mass Index(BMI): 22 Temperature(F): 97.6 Respiratory Rate 16 (breaths/min): Photos: [1:No Photos] [N/A:N/A] Wound Location: [1:Left Lower Leg - Anterior] [N/A:N/A] Wounding Event: [1:Trauma] [N/A:N/A] Primary Etiology: [1:Diabetic Wound/Ulcer of the Lower Extremity] [N/A:N/A] Secondary Etiology: [1:Trauma, Other] [N/A:N/A] Comorbid History: [1:Anemia, Hypertension, Type II Diabetes, Osteoarthritis] [N/A:N/A] Date Acquired: [1:05/02/2017] [N/A:N/A] Weeks of Treatment: [1:16] [N/A:N/A] Wound Status: [1:Open] [N/A:N/A] Measurements L x W x D [1:7.9x3.3x0.1] [N/A:N/A] (cm) Area (cm) : [1:20.475] [N/A:N/A] Volume (cm) : [1:2.048] [N/A:N/A] % Reduction in Area: [1:-325.90%] [N/A:N/A] % Reduction in Volume: [1:-113.10%] [N/A:N/A] Classification: [1:Grade 2] [N/A:N/A] Exudate Amount: [1:Large] [N/A:N/A] Exudate Type: [1:Serous] [N/A:N/A] Exudate Color: [1:amber] [  N/A:N/A] Wound Margin: [1:Flat and Intact] [N/A:N/A] Granulation Amount: [1:None Present (0%)] [N/A:N/A] Necrotic Amount: [1:Large (67-100%)] [N/A:N/A] Necrotic Tissue: [1:Eschar, Adherent Slough] [N/A:N/A] Exposed Structures: [1:Fat Layer (Subcutaneous Tissue) Exposed: Yes Fascia: No Tendon: No Muscle: No Joint: No Bone: No] [N/A:N/A] Epithelialization: [1:None] [N/A:N/A] Debridement: [1:Debridement ([id number])] [N/A:N/A] Pre-procedure [1:14:54] [N/A:N/A] Verification/Time Out Taken: Pain Control: [1:Lidocaine 4% Topical Solution] [N/A:N/A] Tissue Debrided: Fibrin/Slough, Exudates, N/A N/A Subcutaneous Level: Skin/Subcutaneous Tissue N/A N/A Debridement Area (sq cm): 26.07 N/A N/A Instrument: Forceps, Scissors N/A N/A Bleeding: Minimum N/A N/A Hemostasis Achieved: Pressure N/A N/A Procedural Pain: 0 N/A N/A Post Procedural Pain: 0 N/A N/A Debridement Treatment  Procedure was tolerated well N/A N/A Response: Post Debridement 7.9x3.4x0.2 N/A N/A Measurements L x W x D (cm) Post Debridement Volume: 4.219 N/A N/A (cm) Periwound Skin Texture: Scarring: Yes N/A N/A Excoriation: No Induration: No Callus: No Crepitus: No Rash: No Periwound Skin Moisture: Maceration: No N/A N/A Dry/Scaly: No Periwound Skin Color: Erythema: Yes N/A N/A Hemosiderin Staining: Yes Atrophie Blanche: No Cyanosis: No Ecchymosis: No Mottled: No Pallor: No Rubor: No Erythema Location: Circumferential N/A N/A Temperature: No Abnormality N/A N/A Tenderness on Palpation: Yes N/A N/A Wound Preparation: Ulcer Cleansing: N/A N/A Rinsed/Irrigated with Saline Topical Anesthetic Applied: Other: lidocaine 4% Procedures Performed: Debridement N/A N/A Treatment Notes Electronic Signature(s) Signed: 09/17/2017 3:04:27 PM By: Christin Fudge MD, FACS Entered By: Christin Fudge on 09/17/2017 15:04:27 [name], [name] ([id number]) -------------------------------------------------------------------------------- [place] Details Patient Name: Alexa Lowes P. Date of Service: 09/17/2017 2:15 PM Medical Record Number: [id number] Patient Account Number: [id number] Date of Birth/Sex: 1926/06/26 (83 y.o. Female) Treating RN: Carolyne Fiscal, Debi Primary Care Keithon Mccoin: Lucianne Lei Other Clinician: Referring Grisel Blumenstock: Lucianne Lei Treating Sriansh Farra/Extender: Frann Rider in Treatment: 16 Active Inactive ` Abuse / Safety / Falls / Self Care Management Nursing Diagnoses: Potential for falls Goals: Patient will remain injury free related to falls Date Initiated: 05/28/2017 Target Resolution Date: 07/23/2017 Goal Status: Active Interventions: Assess fall risk on admission and as needed Notes: ` Orientation to the Wound Care Program Nursing Diagnoses: Knowledge deficit related to the wound healing center program Goals: Patient/caregiver will  verbalize understanding of the [facility] Date Initiated: 05/28/2017 Target Resolution Date: 07/23/2017 Goal Status: Active Interventions: Provide education on orientation to the wound center Notes: ` Wound/Skin Impairment Nursing Diagnoses: Impaired tissue integrity Goals: Ulcer/skin breakdown will have a volume reduction of 30% by week 4 Date Initiated: 05/28/2017 Target Resolution Date: 07/23/2017 Goal Status: Active Ulcer/skin breakdown will have a volume reduction of 50% by week 8 Date Initiated: 05/28/2017 Target Resolution Date: 07/23/2017 [name], [name] ([id number]) Goal Status: Active Ulcer/skin breakdown will have a volume reduction of 80% by week 12 Date Initiated: 05/28/2017 Target Resolution Date: 07/23/2017 Goal Status: Active Ulcer/skin breakdown will heal within 14 weeks Date Initiated: 05/28/2017 Target Resolution Date: 07/23/2017 Goal Status: Active Interventions: Assess patient/caregiver ability to obtain necessary supplies Assess patient/caregiver ability to perform ulcer/skin care regimen upon admission and as needed Assess ulceration(s) every visit Notes: Electronic Signature(s) Signed: 09/17/2017 4:20:42 PM By: Alric Quan Entered By: Alric Quan on 09/17/2017 14:54:28 [name], [name] ([id number]) -------------------------------------------------------------------------------- Pain Assessment Details Patient Name: Alexa Lowes P. Date of Service: 09/17/2017 2:15 PM Medical Record Number: [id number] Patient Account Number: [id number] Date of Birth/Sex: 27-May-1926 (83 y.o. Female) Treating RN: Carolyne Fiscal, Debi Primary Care Carlitos Bottino: Lucianne Lei Other Clinician: Referring Donshay Lupinski: Lucianne Lei Treating Kaian Fahs/Extender: Frann Rider in Treatment: 16 Active Problems Location of Pain Severity and Description of Pain  Patient Has Paino No Site Locations Pain Management and Medication Current Pain  Management: Electronic Signature(s) Signed: 09/17/2017 4:20:42 PM By: Alric Quan Entered By: Alric Quan on 09/17/2017 14:42:53 [name], [name] ([id number]) -------------------------------------------------------------------------------- Patient/Caregiver Education Details Patient Name: Alexa Lowes P. Date of Service: 09/17/2017 2:15 PM Medical Record Number: [id number] Patient Account Number: [id number] Date of Birth/Gender: September 26, 1926 (83 y.o. Female) Treating RN: Carolyne Fiscal, Debi Primary Care Physician: Lucianne Lei Other Clinician: Referring Physician: Lucianne Lei Treating Physician/Extender: Frann Rider in Treatment: 16 Education Assessment Education Provided To: Patient and Caregiver granddaughter Education Topics Provided Wound/Skin Impairment: Handouts: Other: change dressing as ordered Methods: Demonstration, Explain/Verbal Responses: State content correctly Electronic Signature(s) Signed: 09/17/2017 4:20:42 PM By: Alric Quan Entered By: Alric Quan on 09/17/2017 16:02:06 [name], Kinta. ([id number]) -------------------------------------------------------------------------------- Wound Assessment Details Patient Name: Alexa Lowes P. Date of Service: 09/17/2017 2:15 PM Medical Record Number: [id number] Patient Account Number: [id number] Date of Birth/Sex: 12-26-1925 (83 y.o. Female) Treating RN: Carolyne Fiscal, Debi Primary Care Teva Bronkema: Lucianne Lei Other Clinician: Referring Alizon Schmeling: Lucianne Lei Treating Edona Schreffler/Extender: Frann Rider in Treatment: 16 Wound Status Wound Number: 1 Primary Etiology: Diabetic Wound/Ulcer of the Lower Extremity Wound Location: Left Lower Leg - Anterior Secondary Trauma, Other Wounding Event: Trauma Etiology: Date Acquired: 05/02/2017 Wound Status: Open Weeks Of Treatment: 16 Comorbid Anemia, Hypertension, Type II Diabetes, Clustered Wound: No History:  Osteoarthritis Photos Photo Uploaded By: Alric Quan on 09/17/2017 16:12:28 Wound Measurements Length: (cm) 7.9 Width: (cm) 3.3 Depth: (cm) 0.1 Area: (cm) 20.475 Volume: (cm) 2.048 % Reduction in Area: -325.9% % Reduction in Volume: -113.1% Epithelialization: None Tunneling: No Undermining: No Wound Description Classification: Grade 2 Wound Margin: Flat and Intact Exudate Amount: Large Exudate Type: Serous Exudate Color: amber Foul Odor After Cleansing: No Slough/Fibrino Yes Wound Bed Granulation Amount: None Present (0%) Exposed Structure Necrotic Amount: Large (67-100%) Fascia Exposed: No Necrotic Quality: Eschar, Adherent Slough Fat Layer (Subcutaneous Tissue) Exposed: Yes Tendon Exposed: No Muscle Exposed: No Joint Exposed: No Bone Exposed: No Periwound Skin Texture Enns, Athenia P. ([id number]) Texture Color No Abnormalities Noted: No No Abnormalities Noted: No Callus: No Atrophie Blanche: No Crepitus: No Cyanosis: No Excoriation: No Ecchymosis: No Induration: No Erythema: Yes Rash: No Erythema Location: Circumferential Scarring: Yes Hemosiderin Staining: Yes Mottled: No Moisture Pallor: No No Abnormalities Noted: No Rubor: No Dry / Scaly: No Maceration: No Temperature / Pain Temperature: No Abnormality Tenderness on Palpation: Yes Wound Preparation Ulcer Cleansing: Rinsed/Irrigated with Saline Topical Anesthetic Applied: Other: lidocaine 4%, Treatment Notes Wound #1 (Left, Anterior Lower Leg) 1. Cleansed with: Clean wound with Normal Saline 2. Anesthetic Topical Lidocaine 4% cream to wound bed prior to debridement 4. Dressing Applied: Santyl Ointment 5. Secondary Dressing Applied ABD Pad Kerlix/Conform Non-Adherent pad 7. Secured with Tape Notes netting Electronic Signature(s) Signed: 09/17/2017 4:20:42 PM By: Alric Quan Entered By: Alric Quan on 09/17/2017 14:48:42 [name], [name]  ([id number]) -------------------------------------------------------------------------------- Vitals Details Patient Name: Alexa Lowes P. Date of Service: 09/17/2017 2:15 PM Medical Record Number: [id number] Patient Account Number: [id number] Date of Birth/Sex: 1926-08-31 (83 y.o. Female) Treating RN: Carolyne Fiscal, Debi Primary Care Ethleen Lormand: Lucianne Lei Other Clinician: Referring Jossiah Smoak: Lucianne Lei Treating Nadean Montanaro/Extender: Frann Rider in Treatment: 16 Vital Signs Time Taken: 14:42 Temperature (F): 97.6 Height (in): 65 Pulse (bpm): 79 Weight (lbs): 131 Respiratory Rate (breaths/min): 16 Body Mass Index (BMI): 21.8 Blood Pressure (mmHg): 137/65 Reference Range: 80 - 120 mg / dl Electronic Signature(s) Signed: 09/17/2017 4:20:42 PM By: Alric Quan Entered By: Alric Quan on  09/17/2017 14:44:31 

## 2017-09-21 DIAGNOSIS — I739 Peripheral vascular disease, unspecified: Secondary | ICD-10-CM | POA: Insufficient documentation

## 2017-09-21 NOTE — Progress Notes (Signed)
Subjective:    Patient ID: [name], female    DOB: [birth year], 83 y.o.   MRN: [id number] Chief Complaint  Patient presents with  . Follow-up    Wound check   Patient presents for her first post operative follow-up. She is status post a left common femoral, profunda femoris, and superficial femoral artery endarterectomies with patch angioplasty on 09/06/17. She presents today without complaint. Patient denies any increased pain to the left lower extremity. She continues to receive local wound care for a left shin ulceration. Denies any issues with her surgical incision. Patient states her postoperative course has been relatively unremarkable. The patient underwent a bilateral lower  Extremity ABI which was notable for bilateral ankle brachial indices suggesting moderate bilateral lower cavity arterial occlusive disease. When compared to the previous ABI there has been minimal improvement to her arterial blood flow.Patient denies any fever, nausea or vomiting.   Review of Systems  Constitutional: Negative.   HENT: Negative.   Eyes: Negative.   Respiratory: Negative.   Cardiovascular: Negative.   Gastrointestinal: Negative.   Endocrine: Negative.   Genitourinary: Negative.   Musculoskeletal: Negative.   Skin: Positive for wound.  Allergic/Immunologic: Negative.   Neurological: Negative.   Hematological: Negative.   Psychiatric/Behavioral: Negative.       Objective:   Physical Exam  Constitutional: She is oriented to person, place, and time. She appears well-developed and well-nourished. No distress.  HENT:  Head: Normocephalic and atraumatic.  Eyes: Pupils are equal, round, and reactive to light. Conjunctivae are normal.  Neck: Normal range of motion.  Cardiovascular: Normal rate, regular rhythm, normal heart sounds and intact distal pulses.   Pulses:      Radial pulses are 2+ on the right side, and 2+ on the left side.  Hard to palpate pedal pulses.   Pulmonary/Chest:  Effort normal.  Musculoskeletal: Normal range of motion. She exhibits edema (mild edema to the left lower extremity.).  Neurological: She is alert and oriented to person, place, and time.  Skin: She is not diaphoretic.     Left lower extremity: shin wound is stable. Wound bed with fibrinous exudate. No infection noted. Minimal granulation tissue. No cellulitis. No necrosis. No purulent discharge.  Psychiatric: She has a normal mood and affect. Her behavior is normal. Judgment and thought content normal.  Vitals reviewed.  BP (!) 141/77 (BP Location: Left Arm)   Pulse (!) 112   Resp 15   Ht 5' 5.5" (1.664 m)   Wt 134 lb (60.8 kg)   BMI 21.96 kg/m   Past Medical History:  Diagnosis Date  . Anemia    family unsure of this history  . Arthritis   . Chronic ulcer of calf (HCC)    Left  . Constipation   . Diabetes mellitus without complication ([place])   . Dyslipidemia   . GERD (gastroesophageal reflux disease)   . Hypertension   . Peripheral vascular disease [facility])    Social History   Social History  . Marital status: Legally Separated    Spouse name: N/A  . Number of children: N/A  . Years of education: N/A   Occupational History  . Not on file.   Social History Main Topics  . Smoking status: Never Smoker  . Smokeless tobacco: Never Used  . Alcohol use No  . Drug use: No  . Sexual activity: Not Currently   Other Topics Concern  . Not on file   Social History Narrative  . No narrative  on file   Past Surgical History:  Procedure Laterality Date  . ABDOMINAL HYSTERECTOMY    . ENDARTERECTOMY FEMORAL Left 09/08/2017   Procedure: ENDARTERECTOMY FEMORAL WITH CORMATRIX PATCH;  Surgeon: Algernon Huxley, MD;  Location: [facility] ORS;  Service: Vascular;  Laterality: Left;  . EYE SURGERY Bilateral    Cataract Extraction with IOL  . LOWER EXTREMITY ANGIOGRAPHY Left 07/22/2017   Procedure: Lower Extremity Angiography;  Surgeon: Algernon Huxley, MD;  Location: [place] [facility];   Service: Cardiovascular;  Laterality: Left;  . LOWER EXTREMITY ANGIOGRAPHY Left 08/16/2017   Procedure: Lower Extremity Angiography;  Surgeon: Algernon Huxley, MD;  Location: [place] [facility];  Service: Cardiovascular;  Laterality: Left;  Marland Kitchen MULTIPLE TOOTH EXTRACTIONS     No family history on file.  No Known Allergies     Assessment & Plan:  Patient presents for her first post operative follow-up. She is status post a left common femoral, profunda femoris, and superficial femoral artery endarterectomies with patch angioplasty on 09/06/17. She presents today without complaint. Patient denies any increased pain to the left lower extremity. She continues to receive local wound care for a left shin ulceration. Denies any issues with her surgical incision. Patient states her postoperative course has been relatively unremarkable. The patient underwent a bilateral lower  Extremity ABI which was notable for bilateral ankle brachial indices suggesting moderate bilateral lower cavity arterial occlusive disease. When compared to the previous ABI there has been minimal improvement to her arterial blood flow.Patient denies any fever, nausea or vomiting.  1. PAD (peripheral artery disease) ([place]) - Stable Patient presents for her first postoperative follow-up She presents without complaint however there has not been any improvement in today's ABI or physical exam. Patient with fresh patch from endarterectomy. I would not recommend an angiogram at this time because of this. I will order a CT angiogram of the abdomen and pelvis and lower extremities to assess anatomy and degree of peripheral artery disease status post a left lower extremity endarterectomy Procedure, risks and benefits explained to the patient All questions answered Patient wishes to proceed Patient will discuss results after with Dr. Lucky Cowboy  - CT Angio Abd/Pel w/ and/or w/o; Future - CT ANGIO LOW EXTREM RIGHT W &/OR WO CONTRAST; Future - CT ANGIO  LOW EXTREM LEFT W &/OR WO CONTRAST; Future  2. Atherosclerosis of native arteries of the extremities with ulceration ([place]) - Stable As above  3. Diabetes mellitus without complication ([place]) - Stable Encouraged good control as its slows the progression of atherosclerotic disease  Current Outpatient Prescriptions on File Prior to Visit  Medication Sig Dispense Refill  . amLODipine (NORVASC) 10 MG tablet Take 10 mg by mouth daily.    Marland Kitchen aspirin EC 81 MG tablet Take 81 mg by mouth daily.    Marland Kitchen atorvastatin (LIPITOR) 10 MG tablet Take 10 mg by mouth daily.    . Calcium-Magnesium-Zinc (CAL-MAG-ZINC PO) Take 1 tablet by mouth daily.    . carvedilol (COREG) 6.25 MG tablet Take 6.25 mg by mouth at bedtime.    . Cholecalciferol (VITAMIN D3 GUMMIES ADULT PO) Take 1 tablet by mouth daily.    . clopidogrel (PLAVIX) 75 MG tablet Take 1 tablet (75 mg total) by mouth daily with breakfast. 30 tablet 1  . metFORMIN (GLUCOPHAGE) 1000 MG tablet Take 1,000 mg by mouth 2 (two) times daily with a meal.    . pantoprazole (PROTONIX) 20 MG tablet Take 1 tablet (20 mg total) by mouth daily.  30 tablet 1  . polyethylene glycol (MIRALAX / GLYCOLAX) packet Take 17 g by mouth daily as needed (for constipation.).     Marland Kitchen SANTYL ointment Apply 1 application topically daily. Applied to wound of leg and dress wound  0  . traMADol (ULTRAM) 50 MG tablet Take 1 tablet (50 mg total) by mouth every 6 (six) hours as needed for moderate pain. 30 tablet 1  . valsartan-hydrochlorothiazide (DIOVAN-HCT) 320-25 MG tablet Take 1 tablet by mouth daily.     No current facility-administered medications on file prior to visit.     There are no Patient Instructions on file for this visit. No Follow-up on file.   Cereniti Curb A Juanita Devincent, PA-C

## 2017-09-23 ENCOUNTER — Encounter: Payer: Medicare Other | Admitting: Surgery

## 2017-09-23 DIAGNOSIS — E11622 Type 2 diabetes mellitus with other skin ulcer: Secondary | ICD-10-CM | POA: Diagnosis not present

## 2017-09-24 ENCOUNTER — Ambulatory Visit: Payer: Medicare Other | Admitting: Physician Assistant

## 2017-09-27 ENCOUNTER — Encounter: Payer: Self-pay | Admitting: Podiatry

## 2017-09-27 ENCOUNTER — Ambulatory Visit (INDEPENDENT_AMBULATORY_CARE_PROVIDER_SITE_OTHER): Payer: Medicare Other | Admitting: Podiatry

## 2017-09-27 DIAGNOSIS — D689 Coagulation defect, unspecified: Secondary | ICD-10-CM | POA: Diagnosis not present

## 2017-09-27 DIAGNOSIS — L84 Corns and callosities: Secondary | ICD-10-CM

## 2017-09-27 DIAGNOSIS — B351 Tinea unguium: Secondary | ICD-10-CM | POA: Diagnosis not present

## 2017-09-27 DIAGNOSIS — E1142 Type 2 diabetes mellitus with diabetic polyneuropathy: Secondary | ICD-10-CM | POA: Diagnosis not present

## 2017-09-27 DIAGNOSIS — I739 Peripheral vascular disease, unspecified: Secondary | ICD-10-CM | POA: Diagnosis not present

## 2017-09-27 DIAGNOSIS — E1151 Type 2 diabetes mellitus with diabetic peripheral angiopathy without gangrene: Secondary | ICD-10-CM | POA: Diagnosis not present

## 2017-09-27 DIAGNOSIS — M79676 Pain in unspecified toe(s): Secondary | ICD-10-CM

## 2017-09-27 NOTE — Progress Notes (Signed)
Patient ID: [name], female   DOB: October 20, 1926, 83 y.o.   MRN: [id number]    Subjective: This patient presents again for schedule visit and is complaining that her toenails are uncomfortable when Walking and wearing shoes and requests toenail debridement. Also, patient is complaining of uncomfortable keratoses on the right and left feet  Objective: Orientated 3 Trace pedal pulses bilaterally Reflex immediate bilaterally Sensation to 10 g monofilament wire intact 1/5 right 2/5 left Vibratory sensation reactive bilaterally Ankle reflex reactive bilaterally No open skin lesions bilaterally Atrophic skin with absent hair growth bilaterally Corns on first and second toes bilaterally and fifth right toe and plantar second MPJ left toenails are elongated, brittle, deformed, hypertrophic and tender direct palpation 6-10 HAV bilaterally Hammertoe fifth bilaterally Manual motor testing dorsi flexion, plantar flexion 5/5 bilaterally  Assessment: Symptomatic onychomycoses 6-10 Keratoses 4  Type II diabetic with peripheral arterial disease Diabetic peripheral neuropathy Currently using Plavix, blood clotting disorder  Plan: Debridement toenails 6-10 mechanically left without any bleeding Debrided keratoses 4 without any bleeding  Reappoint 3 months

## 2017-09-27 NOTE — Progress Notes (Signed)
KANYLAH, MUENCH ([id number]) Visit Report for 09/23/2017 Arrival Information Details Patient Name: [name], [name] Date of Service: 09/23/2017 3:15 PM Medical Record Number: [id number] Patient Account Number: [id number] Date of Birth/Sex: 1925-12-21 (83 y.o. Female) Treating RN: Carolyne Fiscal, Debi Primary Care Zan Orlick: Lucianne Lei Other Clinician: Referring Zaydan Papesh: Lucianne Lei Treating Karmella Bouvier/Extender: Frann Rider in Treatment: 16 Visit Information History Since Last Visit All ordered tests and consults were completed: No Patient Arrived: [name] Added or deleted any medications: No Arrival Time: 15:36 Any new allergies or adverse reactions: No Accompanied By: granddaughter Had a fall or experienced change in No Transfer Assistance: None activities of daily living that may affect Patient Identification Verified: Yes risk of falls: Secondary Verification Process Completed: Yes Signs or symptoms of abuse/neglect since last visito No Patient Requires Transmission-Based No Hospitalized since last visit: No Precautions: Has Dressing in Place as Prescribed: Yes Patient Has Alerts: Yes Pain Present Now: No Electronic Signature(s) Signed: 09/23/2017 4:37:37 PM By: Alric Quan Entered By: Alric Quan on 09/23/2017 15:37:16 [name], [name] ([id number]) -------------------------------------------------------------------------------- Clinic Level of Care Assessment Details Patient Name: Alexa Lowes P. Date of Service: 09/23/2017 3:15 PM Medical Record Number: [id number] Patient Account Number: [id number] Date of Birth/Sex: 03-27-1926 (83 y.o. Female) Treating RN: Carolyne Fiscal, Debi Primary Care Lizania Bouchard: Lucianne Lei Other Clinician: Referring Kru Allman: Lucianne Lei Treating Monserrath Junio/Extender: Frann Rider in Treatment: 16 Clinic Level of Care Assessment Items TOOL 4 Quantity Score X - Use when only an EandM is performed on FOLLOW-UP visit 1  0 ASSESSMENTS - Nursing Assessment / Reassessment X - Reassessment of Co-morbidities (includes updates in patient status) 1 10 X- 1 5 Reassessment of Adherence to Treatment Plan ASSESSMENTS - Wound and Skin Assessment / Reassessment X - Simple Wound Assessment / Reassessment - one wound 1 5 []  - 0 Complex Wound Assessment / Reassessment - multiple wounds []  - 0 Dermatologic / Skin Assessment (not related to wound area) ASSESSMENTS - Focused Assessment []  - Circumferential Edema Measurements - multi extremities 0 []  - 0 Nutritional Assessment / Counseling / Intervention []  - 0 Lower Extremity Assessment (monofilament, tuning fork, pulses) []  - 0 Peripheral Arterial Disease Assessment (using hand held doppler) ASSESSMENTS - Ostomy and/or Continence Assessment and Care []  - Incontinence Assessment and Management 0 []  - 0 Ostomy Care Assessment and Management (repouching, etc.) PROCESS - Coordination of Care X - Simple Patient / Family Education for ongoing care 1 15 []  - 0 Complex (extensive) Patient / Family Education for ongoing care []  - 0 Staff obtains Programmer, systems, Records, Test Results / Process Orders []  - 0 Staff telephones HHA, Nursing Homes / Clarify orders / etc []  - 0 Routine Transfer to another Facility (non-emergent condition) []  - 0 Routine Hospital Admission (non-emergent condition) []  - 0 New Admissions / Biomedical engineer / Ordering NPWT, Apligraf, etc. []  - 0 Emergency Hospital Admission (emergent condition) X- 1 10 Simple Discharge Coordination [name], [name] ([id number]) []  - 0 Complex (extensive) Discharge Coordination PROCESS - Special Needs []  - Pediatric / Minor Patient Management 0 []  - 0 Isolation Patient Management []  - 0 Hearing / Language / Visual special needs []  - 0 Assessment of Community assistance (transportation, D/C planning, etc.) []  - 0 Additional assistance / Altered mentation []  - 0 Support Surface(s) Assessment  (bed, cushion, seat, etc.) INTERVENTIONS - Wound Cleansing / Measurement X - Simple Wound Cleansing - one wound 1 5 []  - 0 Complex Wound Cleansing - multiple wounds X- 1 5 Wound Imaging (photographs - any  number of wounds) []  - 0 Wound Tracing (instead of photographs) X- 1 5 Simple Wound Measurement - one wound []  - 0 Complex Wound Measurement - multiple wounds INTERVENTIONS - Wound Dressings X - Small Wound Dressing one or multiple wounds 1 10 []  - 0 Medium Wound Dressing one or multiple wounds []  - 0 Large Wound Dressing one or multiple wounds X- 1 5 Application of Medications - topical []  - 0 Application of Medications - injection INTERVENTIONS - Miscellaneous []  - External ear exam 0 []  - 0 Specimen Collection (cultures, biopsies, blood, body fluids, etc.) []  - 0 Specimen(s) / Culture(s) sent or taken to Lab for analysis []  - 0 Patient Transfer (multiple staff / Civil Service fast streamer / Similar devices) []  - 0 Simple Staple / Suture removal (25 or less) []  - 0 Complex Staple / Suture removal (26 or more) []  - 0 Hypo / Hyperglycemic Management (close monitor of Blood Glucose) []  - 0 Ankle / Brachial Index (ABI) - do not check if billed separately X- 1 5 Vital Signs [name], [name] ([id number]) Has the patient been seen at the hospital within the last three years: Yes Total Score: 80 Level Of Care: New/Established - Level 3 Electronic Signature(s) Signed: 09/23/2017 4:37:37 PM By: Alric Quan Entered By: Alric Quan on 09/23/2017 16:20:56 [name], [name] ([id number]) -------------------------------------------------------------------------------- Encounter Discharge Information Details Patient Name: Alexa Lowes P. Date of Service: 09/23/2017 3:15 PM Medical Record Number: [id number] Patient Account Number: [id number] Date of Birth/Sex: 03/01/1926 (83 y.o. Female) Treating RN: Carolyne Fiscal, Debi Primary Care Willman Cuny: Lucianne Lei Other  Clinician: Referring Leng Montesdeoca: Lucianne Lei Treating Deetta Siegmann/Extender: Frann Rider in Treatment: 16 Encounter Discharge Information Items Discharge Pain Level: 0 Discharge Condition: Stable Ambulatory Status: Walker Discharge Destination: Home Transportation: Private Auto Accompanied By: granddaughter Schedule Follow-up Appointment: Yes Medication Reconciliation completed and No provided to Patient/Care Cypress Hinkson: Provided on Clinical Summary of Care: 09/23/2017 Form Type Recipient Paper Patient [place] Signature(s) Signed: 09/23/2017 4:31:41 PM By: Ruthine Dose Entered By: Ruthine Dose on 09/23/2017 16:00:07 Ovando, [name] ([id number]) -------------------------------------------------------------------------------- Lower Extremity Assessment Details Patient Name: Alexa Lowes P. Date of Service: 09/23/2017 3:15 PM Medical Record Number: [id number] Patient Account Number: [id number] Date of Birth/Sex: 1926/04/25 (83 y.o. Female) Treating RN: Carolyne Fiscal, Debi Primary Care Cassundra Mckeever: Lucianne Lei Other Clinician: Referring Mirka Barbone: Lucianne Lei Treating Lewanna Petrak/Extender: Frann Rider in Treatment: 16 Vascular Assessment Pulses: Posterior Tibial Extremity colors, hair growth, and conditions: Extremity Color: [Left:Hyperpigmented] Temperature of Extremity: [Left:Warm] Capillary Refill: [Left:< 3 seconds] Toe Nail Assessment Left: Right: Thick: No Discolored: No Deformed: No Improper Length and Hygiene: No Electronic Signature(s) Signed: 09/23/2017 4:37:37 PM By: Alric Quan Entered By: Alric Quan on 09/23/2017 15:46:51 Brines, [name] ([id number]) -------------------------------------------------------------------------------- Multi Wound Chart Details Patient Name: Alexa Lowes P. Date of Service: 09/23/2017 3:15 PM Medical Record Number: [id number] Patient Account Number: [id number] Date of Birth/Sex: 08-Apr-1926 (83  y.o. Female) Treating RN: Carolyne Fiscal, Debi Primary Care Jeanell Mangan: Lucianne Lei Other Clinician: Referring Sheliah Fiorillo: Lucianne Lei Treating Skyley Grandmaison/Extender: Frann Rider in Treatment: 16 Vital Signs Height(in): 65 Pulse(bpm): 93 Weight(lbs): 131 Blood Pressure(mmHg): 126/65 Body Mass Index(BMI): 22 Temperature(F): 97.9 Respiratory Rate 16 (breaths/min): Photos: [1:No Photos] [N/A:N/A] Wound Location: [1:Left Lower Leg - Anterior] [N/A:N/A] Wounding Event: [1:Trauma] [N/A:N/A] Primary Etiology: [1:Diabetic Wound/Ulcer of the Lower Extremity] [N/A:N/A] Secondary Etiology: [1:Trauma, Other] [N/A:N/A] Comorbid History: [1:Anemia, Hypertension, Type II Diabetes, Osteoarthritis] [N/A:N/A] Date Acquired: [1:05/02/2017] [N/A:N/A] Weeks of Treatment: [1:16] [N/A:N/A] Wound Status: [1:Open] [N/A:N/A] Measurements L x W x D [1:7.7x3.3x0.1] [  N/A:N/A] (cm) Area (cm) : [1:19.957] [N/A:N/A] Volume (cm) : [1:1.996] [N/A:N/A] % Reduction in Area: [1:-315.20%] [N/A:N/A] % Reduction in Volume: [1:-107.70%] [N/A:N/A] Classification: [1:Grade 2] [N/A:N/A] Exudate Amount: [1:Large] [N/A:N/A] Exudate Type: [1:Serous] [N/A:N/A] Exudate Color: [1:amber] [N/A:N/A] Wound Margin: [1:Flat and Intact] [N/A:N/A] Granulation Amount: [1:None Present (0%)] [N/A:N/A] Necrotic Amount: [1:Large (67-100%)] [N/A:N/A] Necrotic Tissue: [1:Eschar, Adherent Slough] [N/A:N/A] Exposed Structures: [1:Fat Layer (Subcutaneous Tissue) Exposed: Yes Fascia: No Tendon: No Muscle: No Joint: No Bone: No] [N/A:N/A] Epithelialization: [1:None] [N/A:N/A] Periwound Skin Texture: [1:Scarring: Yes Excoriation: No Induration: No Callus: No] [N/A:N/A] Crepitus: No Rash: No Periwound Skin Moisture: Maceration: Yes N/A N/A Dry/Scaly: No Periwound Skin Color: Erythema: Yes N/A N/A Hemosiderin Staining: Yes Atrophie Blanche: No Cyanosis: No Ecchymosis: No Mottled: No Pallor: No Rubor: No Erythema Location:  Circumferential N/A N/A Temperature: No Abnormality N/A N/A Tenderness on Palpation: Yes N/A N/A Wound Preparation: Ulcer Cleansing: N/A N/A Rinsed/Irrigated with Saline Topical Anesthetic Applied: Other: lidocaine 4% Treatment Notes Wound #1 (Left, Anterior Lower Leg) 1. Cleansed with: Clean wound with Normal Saline 2. Anesthetic Topical Lidocaine 4% cream to wound bed prior to debridement 4. Dressing Applied: Santyl Ointment 5. Secondary Dressing Applied ABD Pad Kerlix/Conform Non-Adherent pad 7. Secured with Tape Notes netting Electronic Signature(s) Signed: 09/23/2017 3:54:45 PM By: Christin Fudge MD, FACS Entered By: Christin Fudge on 09/23/2017 15:54:44 [name] ([id number]) -------------------------------------------------------------------------------- [place] Details Patient Name: Alexa Lowes P. Date of Service: 09/23/2017 3:15 PM Medical Record Number: [id number] Patient Account Number: [id number] Date of Birth/Sex: 12/11/1925 (83 y.o. Female) Treating RN: Carolyne Fiscal, Debi Primary Care Erma Raiche: Lucianne Lei Other Clinician: Referring Kellin Bartling: Lucianne Lei Treating Aalliyah Kilker/Extender: Frann Rider in Treatment: 16 Active Inactive ` Abuse / Safety / Falls / Self Care Management Nursing Diagnoses: Potential for falls Goals: Patient will remain injury free related to falls Date Initiated: 05/28/2017 Target Resolution Date: 07/23/2017 Goal Status: Active Interventions: Assess fall risk on admission and as needed Notes: ` Orientation to the Wound Care Program Nursing Diagnoses: Knowledge deficit related to the wound healing center program Goals: Patient/caregiver will verbalize understanding of the [facility] Program Date Initiated: 05/28/2017 Target Resolution Date: 07/23/2017 Goal Status: Active Interventions: Provide education on orientation to the wound center Notes: ` Wound/Skin Impairment Nursing  Diagnoses: Impaired tissue integrity Goals: Ulcer/skin breakdown will have a volume reduction of 30% by week 4 Date Initiated: 05/28/2017 Target Resolution Date: 07/23/2017 Goal Status: Active Ulcer/skin breakdown will have a volume reduction of 50% by week 8 Date Initiated: 05/28/2017 Target Resolution Date: 07/23/2017 [name], [name] ([id number]) Goal Status: Active Ulcer/skin breakdown will have a volume reduction of 80% by week 12 Date Initiated: 05/28/2017 Target Resolution Date: 07/23/2017 Goal Status: Active Ulcer/skin breakdown will heal within 14 weeks Date Initiated: 05/28/2017 Target Resolution Date: 07/23/2017 Goal Status: Active Interventions: Assess patient/caregiver ability to obtain necessary supplies Assess patient/caregiver ability to perform ulcer/skin care regimen upon admission and as needed Assess ulceration(s) every visit Notes: Electronic Signature(s) Signed: 09/23/2017 4:37:37 PM By: Alric Quan Entered By: Alric Quan on 09/23/2017 15:47:09 Villeda, [name] ([id number]) -------------------------------------------------------------------------------- Pain Assessment Details Patient Name: Alexa Lowes P. Date of Service: 09/23/2017 3:15 PM Medical Record Number: [id number] Patient Account Number: [id number] Date of Birth/Sex: 1926/11/21 (83 y.o. Female) Treating RN: Carolyne Fiscal, Debi Primary Care Quandre Polinski: Lucianne Lei Other Clinician: Referring Zarion Oliff: Lucianne Lei Treating Bryer Gottsch/Extender: Frann Rider in Treatment: 16 Active Problems Location of Pain Severity and Description of Pain Patient Has Paino No Site Locations Pain Management and Medication  Current Pain Management: Electronic Signature(s) Signed: 09/23/2017 4:37:37 PM By: Alric Quan Entered By: Alric Quan on 09/23/2017 15:37:21 Degrasse, [name]  ([id number]) -------------------------------------------------------------------------------- Patient/Caregiver Education Details Patient Name: Alexa Lowes P. Date of Service: 09/23/2017 3:15 PM Medical Record Number: [id number] Patient Account Number: [id number] Date of Birth/Gender: 1926-01-03 (83 y.o. Female) Treating RN: Carolyne Fiscal, Debi Primary Care Physician: Lucianne Lei Other Clinician: Referring Physician: Lucianne Lei Treating Physician/Extender: Frann Rider in Treatment: 16 Education Assessment Education Provided To: Patient Education Topics Provided Wound/Skin Impairment: Handouts: Other: change dressing as ordered Methods: Demonstration, Explain/Verbal Responses: State content correctly Electronic Signature(s) Signed: 09/23/2017 4:37:37 PM By: Alric Quan Entered By: Alric Quan on 09/23/2017 15:52:42 Wickstrom, [name] ([id number]) -------------------------------------------------------------------------------- Wound Assessment Details Patient Name: Alexa Lowes P. Date of Service: 09/23/2017 3:15 PM Medical Record Number: [id number] Patient Account Number: [id number] Date of Birth/Sex: 1926/01/17 (83 y.o. Female) Treating RN: Carolyne Fiscal, Debi Primary Care Devani Odonnel: Lucianne Lei Other Clinician: Referring Elyshia Kumagai: Lucianne Lei Treating Brenee Gajda/Extender: Frann Rider in Treatment: 16 Wound Status Wound Number: 1 Primary Etiology: Diabetic Wound/Ulcer of the Lower Extremity Wound Location: Left Lower Leg - Anterior Secondary Trauma, Other Wounding Event: Trauma Etiology: Date Acquired: 05/02/2017 Wound Status: Open Weeks Of Treatment: 16 Comorbid Anemia, Hypertension, Type II Diabetes, Clustered Wound: No History: Osteoarthritis Photos Photo Uploaded By: Alric Quan on 09/23/2017 16:29:14 Wound Measurements Length: (cm) 7.7 Width: (cm) 3.3 Depth: (cm) 0.1 Area: (cm) 19.957 Volume: (cm) 1.996 % Reduction in  Area: -315.2% % Reduction in Volume: -107.7% Epithelialization: None Tunneling: No Undermining: No Wound Description Classification: Grade 2 Wound Margin: Flat and Intact Exudate Amount: Large Exudate Type: Serous Exudate Color: amber Foul Odor After Cleansing: No Slough/Fibrino Yes Wound Bed Granulation Amount: None Present (0%) Exposed Structure Necrotic Amount: Large (67-100%) Fascia Exposed: No Necrotic Quality: Eschar, Adherent Slough Fat Layer (Subcutaneous Tissue) Exposed: Yes Tendon Exposed: No Muscle Exposed: No Joint Exposed: No Bone Exposed: No Periwound Skin Texture Brobeck, [name] ([id number]) Texture Color No Abnormalities Noted: No No Abnormalities Noted: No Callus: No Atrophie Blanche: No Crepitus: No Cyanosis: No Excoriation: No Ecchymosis: No Induration: No Erythema: Yes Rash: No Erythema Location: Circumferential Scarring: Yes Hemosiderin Staining: Yes Mottled: No Moisture Pallor: No No Abnormalities Noted: No Rubor: No Dry / Scaly: No Maceration: Yes Temperature / Pain Temperature: No Abnormality Tenderness on Palpation: Yes Wound Preparation Ulcer Cleansing: Rinsed/Irrigated with Saline Topical Anesthetic Applied: Other: lidocaine 4%, Treatment Notes Wound #1 (Left, Anterior Lower Leg) 1. Cleansed with: Clean wound with Normal Saline 2. Anesthetic Topical Lidocaine 4% cream to wound bed prior to debridement 4. Dressing Applied: Santyl Ointment 5. Secondary Dressing Applied ABD Pad Kerlix/Conform Non-Adherent pad 7. Secured with Tape Notes netting Electronic Signature(s) Signed: 09/23/2017 4:37:37 PM By: Alric Quan Entered By: Alric Quan on 09/23/2017 15:44:43 Dusseau, [name] ([id number]) -------------------------------------------------------------------------------- Vitals Details Patient Name: Alexa Lowes P. Date of Service: 09/23/2017 3:15 PM Medical Record Number: [id number] Patient Account  Number: [id number] Date of Birth/Sex: 05-04-1926 (83 y.o. Female) Treating RN: Carolyne Fiscal, Debi Primary Care Dajanique Robley: Lucianne Lei Other Clinician: Referring Nichola Warren: Lucianne Lei Treating Dalvin Clipper/Extender: Frann Rider in Treatment: 16 Vital Signs Time Taken: 15:37 Temperature (F): 97.9 Height (in): 65 Pulse (bpm): 93 Weight (lbs): 131 Respiratory Rate (breaths/min): 16 Body Mass Index (BMI): 21.8 Blood Pressure (mmHg): 126/65 Reference Range: 80 - 120 mg / dl Electronic Signature(s) Signed: 09/23/2017 4:37:37 PM By: Alric Quan Entered By: Alric Quan on 09/23/2017 15:39:11

## 2017-09-27 NOTE — Progress Notes (Signed)
[name], KIMPEL ([id number]) Visit Report for 09/23/2017 Chief Complaint Document Details Patient Name: [name], [name] Date of Service: 09/23/2017 3:15 PM Medical Record Number: [id number] Patient Account Number: [id number] Date of Birth/Sex: [birth year] (83 y.o. Female) Treating RN: Carolyne Fiscal, Debi Primary Care Provider: Lucianne Lei Other Clinician: Referring Provider: Lucianne Lei Treating Provider/Extender: Frann Rider in Treatment: 16 Information Obtained from: Patient Chief Complaint Patients presents for treatment of an open diabetic ulcer 2 the left lower anterior leg which she sustained an injury by blunt trauma Electronic Signature(s) Signed: 09/23/2017 3:54:51 PM By: Christin Fudge MD, FACS Entered By: Christin Fudge on 09/23/2017 15:54:51 Mertens, Averyanna P. ([id number]) -------------------------------------------------------------------------------- HPI Details Patient Name: [name] P. Date of Service: 09/23/2017 3:15 PM Medical Record Number: [id number] Patient Account Number: [id number] Date of Birth/Sex: 23-Apr-1926 (83 y.o. Female) Treating RN: Carolyne Fiscal, Debi Primary Care Provider: Lucianne Lei Other Clinician: Referring Provider: Lucianne Lei Treating Provider/Extender: Frann Rider in Treatment: 16 History of Present Illness Location: left anterior shin Quality: Patient reports experiencing a dull pain to affected area(s). Severity: Patient states wound are getting worse. Duration: Patient has had the wound for < 3 weeks prior to presenting for treatment Timing: Pain in wound is Intermittent (comes and goes Context: The wound occurred when the patient blunt trauma against her left shin Modifying Factors: Other treatment(s) tried include:active Bactroban ointment locally Associated Signs and Symptoms: Patient reports having: occasional discharge from the wound HPI Description: 83 year old patient was seen in the ED about 10 days ago  for a history of abrasion to the left lower extremity while she was boarding a bus and scraped the anterior part of her left leg. She has been a diabetic for many years and has been taking treatment regularly. Her past medical history is also significant for anemia arthritis constipation and hypertension and is status post abdominal hysterectomy. She has never been a smoker. after her ER visit she was advised to apply Bactroban ointment to the wound twice a day and continue to monitor her blood glucose levels. her last hemoglobin A1c was done 3 years ago and was 6.6% 06/04/17 patient left anterior shin wound appears to be doing well although it is still somewhat dry despite the treatment with Medihoney. We have been using Kerlex following the Medihoney application and I believe this is just not retaining that much moisture. Nonetheless there is no evidence of infection. 06/11/17 on evaluation today patient appears to be doing a little bit worse in regard to her wound. The entirety of the wound is dry and unfortunately the Medihoney does not seem to be helping this. That is even with the dressing changes that I made last week to try to retain more moisture. She has not tried [employer] as of yet. She was also noncompressible when testing for ABIs. 07/01/2017 -- she had a arterial studies done and was seen by Dr. Lucky Cowboy. her noninvasive studies showed noncompressible vessels on the right but brisk waveforms and digital pressures of 71 on the right consistent with only mild arterial insufficienc. On the left her ABI was 0.47 and this may be falsely elevated due to calcification. No digital pressures were obtained on the left and this is consistent with severe arterial insufficiency. this critical limb threatening situation on the left, would make wound healing difficult and it represents a serious situation and he has recommended an angiography with possible revascularization. The patient desired to defer the  decision to she discuss with the family members. 07/09/17 on evaluation today  patient appears to be doing fairly well in regard to her left anterior lower extremity wound. She has been tolerating the dressing changes without complication we have been utilizing Santyl at this time. She tells me that she is having no significant pain compared to what she has had in the past. She has decided after discussing with family that she is going to go forward with the surgery with Dr. dew. This is to restore better blood flow to left lower extremity. With that it does appear that her lower extremity wound is making some progress albeit slow. No fevers, chills, nausea, or vomiting noted at this time. 07/16/17 on evaluation today patient's wound appears to be doing roughly about the same although some of the slough is clearing off. [name] she has her appointment with vascular next week on Thursday the 23rd. With that being said her wound does not appear to be hurting as badly as it has in the past which is good news. No fevers, chills, nausea, or vomiting noted at this time. 07/30/2017 -- she had surgery on 07/22/2017 --indications being nonhealing ulcer on the left leg with a noninvasive study showing marked reduction in ABI of less than 0.5 and no digital pressure on her left leg. she had a percutaneous transluminal angioplasty of the left common and external iliac arteries. She then had a stent placed to the left external iliac arteries and to the left common iliac artery. the left lower extremity showed occlusion of the common femoral artery and the origins of the [name], Joselin P. ([id number]) SFA and the profunda femoris artery. The flow distally was poor and there were multiple areas of high-grade stenosis or occlusion of the distal SFA and the popliteal artery with only a diseased peroneal artery as runoff distally. She has a postop appointment on September 23 08/20/2017 -- she went back to the vascular  office for an appointment on 08/12/2017 and bilateral ABIs were done which were notable for moderate right lower extremity arterial disease and unable to obtain left ankle brachial indices due to absent Doppler velocities in the posterior tibial artery and the anterior tibial artery. Monophasic waveforms to the left femoral and popliteal artery. this was worrisome for a failure of stents placed and lower extremity angiogram was recommended. as was done on 08/16/2017 and the stents placed in the left eye Lex system had some mild narrowing at the iliac bifurcation and the leading edge of the stent proximally was poor wall apposition with stenosis in the 50-60% range in the right common iliac artery. There was also occlusion of the common femoral artery and the origin of the SFA and profunda femoris artery. Flow distally was poor but they appeared to be multiple areas of high-grade stenosis or occlusion in the distal SFA and popliteal artery with only a diseased peroneal artery. in another stent was placed and deployed. The femoral occlusion would need to be treated surgically. 08/27/17 on evaluation today patient appears to be doing about the same in regard to her left for extremity wound. Unfortunately secondary to her vascular status this is not a very well healing wound at this time. She has been tolerating the dressing changes she has some discomfort but mainly with cleansing of the wound not at other times. No fevers, chills, nausea, or vomiting noted at this time. 09/17/2017 -- the patient was recently discharged from the hospital where she was admitted between 09/08/2017 and 09/10/2017, when she had a procedure on her left femoral artery with the  endarterectomy and a patch. she will be going back for review this coming week. 09/24/2017 -- was seen on 09/18/2017 in the ER, for bleeding from her wound which was debrided earlier during the day. At the time she was seen there was no active  bleeding. A thrombin pad was placed over the area and she was asked to change the outside dressing but come back to the wound center as planned. she was also seen at the [facility] office for a review after her surgery and bilateral lower extremity ABI when compared to the previous ones showed minimal improvement of arterial blood flow. She was set up for a CT angiogram of the abdomen and pelvis and lower extremities to see the degree of peripheral arterial disease Electronic Signature(s) Signed: 09/23/2017 3:58:45 PM By: Christin Fudge MD, FACS Entered By: Christin Fudge on 09/23/2017 15:58:45 Guay, [name] ([id number]) -------------------------------------------------------------------------------- Physical Exam Details Patient Name: [name] P. Date of Service: 09/23/2017 3:15 PM Medical Record Number: [id number] Patient Account Number: [id number] Date of Birth/Sex: 16-Mar-1926 (83 y.o. Female) Treating RN: Carolyne Fiscal, Debi Primary Care Provider: Lucianne Lei Other Clinician: Referring Provider: Lucianne Lei Treating Provider/Extender: Frann Rider in Treatment: 16 Constitutional . Pulse regular. Respirations normal and unlabored. Afebrile. . Eyes Nonicteric. Reactive to light. Ears, Nose, Mouth, and Throat Lips, teeth, and gums WNL.Marland Kitchen Moist mucosa without lesions. Neck supple and nontender. No palpable supraclavicular or cervical adenopathy. Normal sized without goiter. Respiratory WNL. No retractions.. Cardiovascular Pedal Pulses WNL. No clubbing, cyanosis or edema. Lymphatic No adneopathy. No adenopathy. No adenopathy. Musculoskeletal Adexa without tenderness or enlargement.. Digits and nails w/o clubbing, cyanosis, infection, petechiae, ischemia, or inflammatory conditions.. Integumentary (Hair, Skin) No suspicious lesions. No crepitus or fluctuance. No peri-wound warmth or erythema. No masses.Marland Kitchen Psychiatric Judgement and insight  Intact.. No evidence of depression, anxiety, or agitation.. Notes the patient continues to have a lot of necrotic debris and this was washed out with moist saline gauze and no sharp debridement was done today. We will continue with Santyl ointment locally Electronic Signature(s) Signed: 09/23/2017 3:59:25 PM By: Christin Fudge MD, FACS Entered By: Christin Fudge on 09/23/2017 15:59:24 [name] ([id number]) -------------------------------------------------------------------------------- Physician Orders Details Patient Name: [name] P. Date of Service: 09/23/2017 3:15 PM Medical Record Number: [id number] Patient Account Number: [id number] Date of Birth/Sex: 03-Jul-1926 (83 y.o. Female) Treating RN: Carolyne Fiscal, Debi Primary Care Provider: Lucianne Lei Other Clinician: Referring Provider: Lucianne Lei Treating Provider/Extender: Frann Rider in Treatment: 16 Verbal / Phone Orders: Yes Clinician: Carolyne Fiscal, Debi Read Back and Verified: Yes Diagnosis Coding Wound Cleansing Wound #1 Left,Anterior Lower Leg o Clean wound with Normal Saline. o May Shower, gently pat wound dry prior to applying new dressing. Anesthetic Wound #1 Left,Anterior Lower Leg o Topical Lidocaine 4% cream applied to wound bed prior to debridement Primary Wound Dressing Wound #1 Left,Anterior Lower Leg o Santyl Ointment Secondary Dressing Wound #1 Left,Anterior Lower Leg o ABD pad o Conform/Kerlix o Non-adherent pad o Other - stretch netting #4 Dressing Change Frequency Wound #1 Left,Anterior Lower Leg o Change dressing every day. Follow-up Appointments Wound #1 Left,Anterior Lower Leg o Return Appointment in 1 week. Edema Control Wound #1 Left,Anterior Lower Leg o Elevate legs to the level of the heart and pump ankles as often as possible Additional Orders / Instructions Wound #1 Left,Anterior Lower Leg o Increase protein intake. o Other: - Please add  vitamin A, vitamin C and zinc supplements to your diet Electronic Signature(s) Signed: 09/23/2017  4:33:04 PM By: Christin Fudge MD, FACS Signed: 09/23/2017 4:37:37 PM By: Gerome Apley, [name] ([id number]) Entered By: Alric Quan on 09/23/2017 15:50:27 Javed, [name] ([id number]) -------------------------------------------------------------------------------- Problem List Details Patient Name: [name] P. Date of Service: 09/23/2017 3:15 PM Medical Record Number: [id number] Patient Account Number: [id number] Date of Birth/Sex: Jan 04, 1926 (83 y.o. 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 ([id number]) -------------------------------------------------------------------------------- Progress Note Details Patient Name: [name] P. Date of Service: 09/23/2017 3:15 PM Medical Record Number: [id number] Patient Account Number: [id number] Date of Birth/Sex: 1926-10-03 (83 y.o. Female) Treating RN: Carolyne Fiscal, Debi Primary Care Provider: Lucianne Lei Other Clinician: Referring Provider: Lucianne Lei Treating Provider/Extender: Frann Rider in Treatment: 16 Subjective Chief Complaint Information obtained from Patient Patients presents for treatment of an open diabetic ulcer 2 the left lower anterior leg  which she sustained an injury by blunt trauma History of Present Illness (HPI) The following HPI elements were documented for the patient's wound: Location: left anterior shin Quality: Patient reports experiencing a dull pain to affected area(s). Severity: Patient states wound are getting worse. Duration: Patient has had the wound for < 3 weeks prior to presenting for treatment Timing: Pain in wound is Intermittent (comes and goes Context: The wound occurred when the patient blunt trauma against her left shin Modifying Factors: Other treatment(s) tried include:active Bactroban ointment locally Associated Signs and Symptoms: Patient reports having: occasional discharge from the wound 83 year old patient was seen in the ED about 10 days ago for a history of abrasion to the left lower extremity while she was boarding a bus and scraped the anterior part of her left leg. She has been a diabetic for many years and has been taking treatment regularly. Her past medical history is also significant for anemia arthritis constipation and hypertension and is status post abdominal hysterectomy. She has never been a smoker. after her ER visit she was advised to apply Bactroban ointment to the wound twice a day and continue to monitor her blood glucose levels. her last hemoglobin A1c was done 3 years ago and was 6.6% 06/04/17 patient left anterior shin wound appears to be doing well although it is still somewhat dry despite the treatment with Medihoney. We have been using Kerlex following the Medihoney application and I believe this is just not retaining that much moisture. Nonetheless there is no evidence of infection. 06/11/17 on evaluation today patient appears to be doing a little bit worse in regard to her wound. The entirety of the wound is dry and unfortunately the Medihoney does not seem to be helping this. That is even with the dressing changes that I made last week to try to retain more moisture. She  has not tried [employer] as of yet. She was also noncompressible when testing for ABIs. 07/01/2017 -- she had a arterial studies done and was seen by Dr. Lucky Cowboy. her noninvasive studies showed noncompressible vessels on the right but brisk waveforms and digital pressures of 71 on the right consistent  with only mild arterial insufficienc. On the left her ABI was 0.47 and this may be falsely elevated due to calcification. No digital pressures were obtained on the left and this is consistent with severe arterial insufficiency. this critical limb threatening situation on the left, would make wound healing difficult and it represents a serious situation and he has recommended an angiography with possible revascularization. The patient desired to defer the decision to she discuss with the family members. 07/09/17 on evaluation today patient appears to be doing fairly well in regard to her left anterior lower extremity wound. She has been tolerating the dressing changes without complication we have been utilizing Santyl at this time. She tells me that she is having no significant pain compared to what she has had in the past. She has decided after discussing with family that she is going to go forward with the surgery with Dr. dew. This is to restore better blood flow to left lower extremity. With that it does appear that her lower extremity wound is making some progress albeit slow. No fevers, chills, nausea, or vomiting noted at this time. ROSHANA, SHUFFIELD ([id number]) 07/16/17 on evaluation today patient's wound appears to be doing roughly about the same although some of the slough is clearing off. [name] she has her appointment with vascular next week on Thursday the 23rd. With that being said her wound does not appear to be hurting as badly as it has in the past which is good news. No fevers, chills, nausea, or vomiting noted at this time. 07/30/2017 -- she had surgery on 07/22/2017 --indications being  nonhealing ulcer on the left leg with a noninvasive study showing marked reduction in ABI of less than 0.5 and no digital pressure on her left leg. she had a percutaneous transluminal angioplasty of the left common and external iliac arteries. She then had a stent placed to the left external iliac arteries and to the left common iliac artery. 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 The flow distally was poor and there were multiple areas of high-grade stenosis or occlusion of the distal SFA and the popliteal artery with only a diseased peroneal artery as runoff distally. She has a postop appointment on September 23 08/20/2017 -- she went back to the vascular office for an appointment on 08/12/2017 and bilateral ABIs were done which were notable for moderate right lower extremity arterial disease and unable to obtain left ankle brachial indices due to absent Doppler velocities in the posterior tibial artery and the anterior tibial artery. Monophasic waveforms to the left femoral and popliteal artery. this was worrisome for a failure of stents placed and lower extremity angiogram was recommended. as was done on 08/16/2017 and the stents placed in the left eye Lex system had some mild narrowing at the iliac bifurcation and the leading edge of the stent proximally was poor wall apposition with stenosis in the 50-60% range in the right common iliac artery. There was also occlusion of the common femoral artery and the origin of the SFA and profunda femoris artery. Flow distally was poor but they appeared to be multiple areas of high-grade stenosis or occlusion in the distal SFA and popliteal artery with only a diseased peroneal artery. in another stent was placed and deployed. The femoral occlusion would need to be treated surgically. 08/27/17 on evaluation today patient appears to be doing about the same in regard  to  her left for extremity wound. Unfortunately secondary to her vascular status this is not a very well healing wound at this time. She has been tolerating the dressing changes she has some discomfort but mainly with cleansing of the wound not at other times. No fevers, chills, nausea, or vomiting noted at this time. 09/17/2017 -- the patient was recently discharged from the hospital where she was admitted between 09/08/2017 and 09/10/2017, when she had a procedure on her left femoral artery with the endarterectomy and a patch. she will be going back for review this coming week. 09/24/2017 -- was seen on 09/18/2017 in the ER, for bleeding from her wound which was debrided earlier during the day. At the time she was seen there was no active bleeding. A thrombin pad was placed over the area and she was asked to change the outside dressing but come back to the wound center as planned. she was also seen at the [facility] office for a review after her surgery and bilateral lower extremity ABI when compared to the previous ones showed minimal improvement of arterial blood flow. She was set up for a CT angiogram of the abdomen and pelvis and lower extremities to see the degree of peripheral arterial disease Objective Constitutional Pulse regular. Respirations normal and unlabored. Afebrile. Vitals Time Taken: 3:37 PM, Height: 65 in, Weight: 131 lbs, BMI: 21.8, Temperature: 97.9 F, Pulse: 93 bpm, Respiratory Rate: 16 breaths/min, Blood Pressure: 126/65 mmHg. Eyes Nonicteric. Reactive to light. Welcome, Jodye P. ([id number]) Ears, Nose, Mouth, and Throat Lips, teeth, and gums WNL.Marland Kitchen Moist mucosa without lesions. Neck supple and nontender. No palpable supraclavicular or cervical adenopathy. Normal sized without goiter. Respiratory WNL. No retractions.. Cardiovascular Pedal Pulses WNL. No clubbing, cyanosis or edema. Lymphatic No adneopathy. No adenopathy. No  adenopathy. Musculoskeletal Adexa without tenderness or enlargement.. Digits and nails w/o clubbing, cyanosis, infection, petechiae, ischemia, or inflammatory conditions.Marland Kitchen Psychiatric Judgement and insight Intact.. No evidence of depression, anxiety, or agitation.. General Notes: the patient continues to have a lot of necrotic debris and this was washed out with moist saline gauze and no sharp debridement was done today. We will continue with Santyl ointment locally Integumentary (Hair, Skin) No suspicious lesions. No crepitus or fluctuance. No peri-wound warmth or erythema. No masses.. Wound #1 status is Open. Original cause of wound was Trauma. The wound is located on the Left,Anterior Lower Leg. The wound measures 7.7cm length x 3.3cm width x 0.1cm depth; 19.957cm^2 area and 1.996cm^3 volume. There is Fat Layer (Subcutaneous Tissue) Exposed exposed. There is no tunneling or undermining noted. There is a large amount of serous drainage noted. The wound margin is flat and intact. There is no granulation within the wound bed. There is a large (67-100%) amount of necrotic tissue within the wound bed including Eschar and Adherent Slough. The periwound skin appearance exhibited: Scarring, Maceration, Hemosiderin Staining, Erythema. The periwound skin appearance did not exhibit: Callus, Crepitus, Excoriation, Induration, Rash, Dry/Scaly, Atrophie Blanche, Cyanosis, Ecchymosis, Mottled, Pallor, Rubor. The surrounding wound skin color is noted with erythema which is circumferential. Periwound temperature was noted as No Abnormality. The periwound has tenderness on palpation. 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 ([id number]) Wound Cleansing: Wound #1 Left,Anterior  Lower Leg: Clean wound with Normal Saline. May Shower, gently  pat wound dry prior to applying new dressing. Anesthetic: Wound #1 Left,Anterior Lower Leg: Topical Lidocaine 4% cream applied to wound bed prior to debridement Primary Wound Dressing: Wound #1 Left,Anterior Lower Leg: Santyl Ointment Secondary Dressing: Wound #1 Left,Anterior Lower Leg: ABD pad Conform/Kerlix Non-adherent pad Other - stretch netting #4 Dressing Change Frequency: Wound #1 Left,Anterior Lower Leg: Change dressing every day. Follow-up Appointments: Wound #1 Left,Anterior Lower Leg: Return Appointment in 1 week. Edema Control: Wound #1 Left,Anterior Lower Leg: Elevate legs to the level of the heart and pump ankles as often as possible Additional Orders / Instructions: Wound #1 Left,Anterior Lower Leg: Increase protein intake. Other: - Please add vitamin A, vitamin C and zinc supplements to your diet I reviewed her recent vascular appointment and notes she has been set up for a CT angiogram. After review today, I have recommended: 1. Daily washing with soap and water and application of Santyl ointment locally with a light Kerlix dressing. 2. good control of her diabetes mellitus 3. Adequate protein, vitamin A, vitamin C and zinc 4. Regular visits the wound center Electronic Signature(s) Signed: 09/23/2017 4:00:27 PM By: Christin Fudge MD, FACS Entered By: Christin Fudge on 09/23/2017 16:00:26 Andreoni, [name] ([id number]) -------------------------------------------------------------------------------- SuperBill Details Patient Name: [name] P. Date of Service: 09/23/2017 Medical Record Number: [id number] Patient Account Number: [id number] Date of Birth/Sex: [birth year] (83 y.o. 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 Facility Procedures CPT4 Code: [id number] Description: 99213 - WOUND CARE VISIT-LEV 3 EST PT Modifier: Quantity: 1 Physician Procedures CPT4 Code: [id number] Description: 04599 - WC PHYS LEVEL 3 - EST PT ICD-10 Diagnosis Description E11.622 Type 2 diabetes mellitus with other skin 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

## 2017-09-27 NOTE — Patient Instructions (Signed)

## 2017-10-01 ENCOUNTER — Encounter: Payer: Medicare Other | Attending: Surgery | Admitting: Surgery

## 2017-10-01 DIAGNOSIS — L97222 Non-pressure chronic ulcer of left calf with fat layer exposed: Secondary | ICD-10-CM | POA: Insufficient documentation

## 2017-10-01 DIAGNOSIS — K5900 Constipation, unspecified: Secondary | ICD-10-CM | POA: Diagnosis not present

## 2017-10-01 DIAGNOSIS — D649 Anemia, unspecified: Secondary | ICD-10-CM | POA: Insufficient documentation

## 2017-10-01 DIAGNOSIS — I70242 Atherosclerosis of native arteries of left leg with ulceration of calf: Secondary | ICD-10-CM | POA: Diagnosis not present

## 2017-10-01 DIAGNOSIS — I10 Essential (primary) hypertension: Secondary | ICD-10-CM | POA: Insufficient documentation

## 2017-10-01 DIAGNOSIS — M1990 Unspecified osteoarthritis, unspecified site: Secondary | ICD-10-CM | POA: Insufficient documentation

## 2017-10-01 DIAGNOSIS — E11622 Type 2 diabetes mellitus with other skin ulcer: Secondary | ICD-10-CM | POA: Insufficient documentation

## 2017-10-03 NOTE — Progress Notes (Signed)
SHANNEL, ZAHM ([id number]) Visit Report for 10/01/2017 Chief Complaint Document Details Patient Name: [name], [name] Date of Service: 10/01/2017 3:15 PM Medical Record Number: [id number] Patient Account Number: [id number] Date of Birth/Sex: 1926-08-14 (83 y.o. Female) Treating RN: Carolyne Fiscal, Debi Primary Care Provider: Lucianne Lei Other Clinician: Referring Provider: Lucianne Lei Treating Provider/Extender: Frann Rider in Treatment: 18 Information Obtained from: Patient Chief Complaint Patients presents for treatment of an open diabetic ulcer 2 the left lower anterior leg which she sustained an injury by blunt trauma Electronic Signature(s) Signed: 10/01/2017 3:59:19 PM By: Christin Fudge MD, FACS Entered By: Christin Fudge on 10/01/2017 15:59:18 Pousson, ERXVQMGQ P. ([id number]) -------------------------------------------------------------------------------- Debridement Details Patient Name: Alexa Lowes P. Date of Service: 10/01/2017 3:15 PM Medical Record Number: [id number] Patient Account Number: [id number] Date of Birth/Sex: 02/29/1926 (83 y.o. Female) Treating RN: Carolyne Fiscal, Debi Primary Care Provider: Lucianne Lei Other Clinician: Referring Provider: Lucianne Lei Treating Provider/Extender: Frann Rider in Treatment: 18 Debridement Performed for Wound #1 Left,Anterior Lower Leg Assessment: Performed By: Physician Christin Fudge, MD Debridement: Debridement Severity of Tissue Pre Fat layer exposed Debridement: Pre-procedure Verification/Time Yes - 15:17 Out Taken: Start Time: 15:18 Pain Control: Lidocaine 4% Topical Solution Level: Skin/Subcutaneous Tissue Total Area Debrided (L x W): 2 (cm) x 2 (cm) = 4 (cm) Tissue and other material Viable, Non-Viable, Exudate, Fibrin/Slough, Subcutaneous debrided: Instrument: Forceps, Scissors Bleeding: Minimum Hemostasis Achieved: Pressure End Time: 15:20 Procedural Pain: 0 Post Procedural Pain:  0 Response to Treatment: Procedure was tolerated well Post Debridement Measurements of Total Wound Length: (cm) 7.5 Width: (cm) 3.5 Depth: (cm) 0.2 Volume: (cm) 4.123 Character of Wound/Ulcer Post Debridement: Requires Further Debridement Severity of Tissue Post Debridement: Fat layer exposed Post Procedure Diagnosis Same as Pre-procedure Notes note in 2 areas the bone is palpable Electronic Signature(s) Signed: 10/01/2017 3:59:12 PM By: Christin Fudge MD, FACS Signed: 10/01/2017 4:07:41 PM By: Alric Quan Entered By: Christin Fudge on 10/01/2017 15:59:12 [name], [name] ([id number]) -------------------------------------------------------------------------------- HPI Details Patient Name: Alexa Lowes P. Date of Service: 10/01/2017 3:15 PM Medical Record Number: [id number] Patient Account Number: [id number] Date of Birth/Sex: August 14, 1926 (83 y.o. Female) Treating RN: Carolyne Fiscal, Debi Primary Care Provider: Lucianne Lei Other Clinician: Referring Provider: Lucianne Lei Treating Provider/Extender: Frann Rider in Treatment: 18 History of Present Illness Location: left anterior shin Quality: Patient reports experiencing a dull pain to affected area(s). Severity: Patient states wound are getting worse. Duration: Patient has had the wound for < 3 weeks prior to presenting for treatment Timing: Pain in wound is Intermittent (comes and goes Context: The wound occurred when the patient blunt trauma against her left shin Modifying Factors: Other treatment(s) tried include:active Bactroban ointment locally Associated Signs and Symptoms: Patient reports having: occasional discharge from the wound HPI Description: 83 year old patient was seen in the ED about 10 days ago for a history of abrasion to the left lower extremity while she was boarding a bus and scraped the anterior part of her left leg. She has been a diabetic for many years and has been taking treatment regularly.  Her past medical history is also significant for anemia arthritis constipation and hypertension and is status post abdominal hysterectomy. She has never been a smoker. after her ER visit she was advised to apply Bactroban ointment to the wound twice a day and continue to monitor her blood glucose levels. her last hemoglobin A1c was done 3 years ago and was 6.6% 06/04/17 patient left anterior shin wound appears to be doing well although it is  still somewhat dry despite the treatment with Medihoney. We have been using Kerlex following the Medihoney application and I believe this is just not retaining that much moisture. Nonetheless there is no evidence of infection. 06/11/17 on evaluation today patient appears to be doing a little bit worse in regard to her wound. The entirety of the wound is dry and unfortunately the Medihoney does not seem to be helping this. That is even with the dressing changes that I made last week to try to retain more moisture. She has not tried [employer] as of yet. She was also noncompressible when testing for ABIs. 07/01/2017 -- she had a arterial studies done and was seen by Dr. Lucky Cowboy. her noninvasive studies showed noncompressible vessels on the right but brisk waveforms and digital pressures of 71 on the right consistent with only mild arterial insufficienc. On the left her ABI was 0.47 and this may be falsely elevated due to calcification. No digital pressures were obtained on the left and this is consistent with severe arterial insufficiency. this critical limb threatening situation on the left, would make wound healing difficult and it represents a serious situation and he has recommended an angiography with possible revascularization. The patient desired to defer the decision to she discuss with the family members. 07/09/17 on evaluation today patient appears to be doing fairly well in regard to her left anterior lower extremity wound. She has been tolerating the dressing  changes without complication we have been utilizing Santyl at this time. She tells me that she is having no significant pain compared to what she has had in the past. She has decided after discussing with family that she is going to go forward with the surgery with Dr. dew. This is to restore better blood flow to left lower extremity. With that it does appear that her lower extremity wound is making some progress albeit slow. No fevers, chills, nausea, or vomiting noted at this time. 07/16/17 on evaluation today patient's wound appears to be doing roughly about the same although some of the slough is clearing off. [name] she has her appointment with vascular next week on Thursday the 23rd. With that being said her wound does not appear to be hurting as badly as it has in the past which is good news. No fevers, chills, nausea, or vomiting noted at this time. 07/30/2017 -- she had surgery on 07/22/2017 --indications being nonhealing ulcer on the left leg with a noninvasive study showing marked reduction in ABI of less than 0.5 and no digital pressure on her left leg. she had a percutaneous transluminal angioplasty of the left common and external iliac arteries. She then had a stent placed to the left external iliac arteries and to the left common iliac artery. the left lower extremity showed occlusion of the common femoral artery and the origins of the Retzloff, Taraann P. ([id number]) SFA and the profunda femoris artery. The flow distally was poor and there were multiple areas of high-grade stenosis or occlusion of the distal SFA and the popliteal artery with only a diseased peroneal artery as runoff distally. She has a postop appointment on September 23 08/20/2017 -- she went back to the vascular office for an appointment on 08/12/2017 and bilateral ABIs were done which were notable for moderate right lower extremity arterial disease and unable to obtain left ankle brachial indices due to  absent Doppler velocities in the posterior tibial artery and the anterior tibial artery. Monophasic waveforms to the left femoral and popliteal artery. this  was worrisome for a failure of stents placed and lower extremity angiogram was recommended. as was done on 08/16/2017 and the stents placed in the left eye Lex system had some mild narrowing at the iliac bifurcation and the leading edge of the stent proximally was poor wall apposition with stenosis in the 50-60% range in the right common iliac artery. There was also occlusion of the common femoral artery and the origin of the SFA and profunda femoris artery. Flow distally was poor but they appeared to be multiple areas of high-grade stenosis or occlusion in the distal SFA and popliteal artery with only a diseased peroneal artery. in another stent was placed and deployed. The femoral occlusion would need to be treated surgically. 08/27/17 on evaluation today patient appears to be doing about the same in regard to her left for extremity wound. Unfortunately secondary to her vascular status this is not a very well healing wound at this time. She has been tolerating the dressing changes she has some discomfort but mainly with cleansing of the wound not at other times. No fevers, chills, nausea, or vomiting noted at this time. 09/17/2017 -- the patient was recently discharged from the hospital where she was admitted between 09/08/2017 and 09/10/2017, when she had a procedure on her left femoral artery with the endarterectomy and a patch. she will be going back for review this coming week. 09/24/2017 -- was seen on 09/18/2017 in the ER, for bleeding from her wound which was debrided earlier during the day. At the time she was seen there was no active bleeding. A thrombin pad was placed over the area and she was asked to change the outside dressing but come back to the wound center as planned. she was also seen at the [facility] office for a review after her surgery and bilateral lower extremity ABI when compared to the previous ones showed minimal improvement of arterial blood flow. She was set up for a CT angiogram of the abdomen and pelvis and lower extremities to see the degree of peripheral arterial disease Electronic Signature(s) Signed: 10/01/2017 3:59:23 PM By: Christin Fudge MD, FACS Entered By: Christin Fudge on 10/01/2017 15:59:23 [name], [name] ([id number]) -------------------------------------------------------------------------------- Physical Exam Details Patient Name: Alexa Lowes P. Date of Service: 10/01/2017 3:15 PM Medical Record Number: [id number] Patient Account Number: [id number] Date of Birth/Sex: [birth year] (83 y.o. Female) Treating RN: Carolyne Fiscal, Debi Primary Care Provider: Lucianne Lei Other Clinician: Referring Provider: Lucianne Lei Treating Provider/Extender: Frann Rider in Treatment: 18 Constitutional . Pulse regular. Respirations normal and unlabored. Afebrile. . Eyes Nonicteric. Reactive to light. Ears, Nose, Mouth, and Throat Lips, teeth, and gums WNL.Marland Kitchen Moist mucosa without lesions. Neck supple and nontender. No palpable supraclavicular or cervical adenopathy. Normal sized without goiter. Respiratory WNL. No retractions.. Cardiovascular Pedal Pulses WNL. No clubbing, cyanosis or edema. Lymphatic No adneopathy. No adenopathy. No adenopathy. Musculoskeletal Adexa without tenderness or enlargement.. Digits and nails w/o clubbing, cyanosis, infection, petechiae, ischemia, or inflammatory conditions.. Integumentary (Hair, Skin) No suspicious lesions. No crepitus or fluctuance. No peri-wound warmth or erythema. No masses.Marland Kitchen Psychiatric Judgement and insight Intact.. No evidence of depression, anxiety, or agitation.. Notes the superficial necrotic debris was sharply removed with forceps and scissors and at the base in a couple of areas that is bone  palpable. No bleeding noted Electronic Signature(s) Signed: 10/01/2017 4:00:12 PM By: Christin Fudge MD, FACS Entered By: Christin Fudge on 10/01/2017 16:00:12 [name] ([id number]) -------------------------------------------------------------------------------- Physician Orders Details Patient Name: [name],  [name] Date of Service: 10/01/2017 3:15 PM Medical Record Number: [id number] Patient Account Number: [id number] Date of Birth/Sex: 12/19/1925 (83 y.o. Female) Treating RN: Carolyne Fiscal, Debi Primary Care Provider: Lucianne Lei Other Clinician: Referring Provider: Lucianne Lei Treating Provider/Extender: Frann Rider in Treatment: 54 Verbal / Phone Orders: Yes Clinician: Carolyne Fiscal, Debi Read Back and Verified: Yes Diagnosis Coding Wound Cleansing Wound #1 Left,Anterior Lower Leg o Clean wound with Normal Saline. o May Shower, gently pat wound dry prior to applying new dressing. Anesthetic Wound #1 Left,Anterior Lower Leg o Topical Lidocaine 4% cream applied to wound bed prior to debridement Primary Wound Dressing Wound #1 Left,Anterior Lower Leg o Santyl Ointment Secondary Dressing Wound #1 Left,Anterior Lower Leg o ABD pad o Conform/Kerlix o Non-adherent pad o Other - stretch netting #4 Dressing Change Frequency Wound #1 Left,Anterior Lower Leg o Change dressing every day. Follow-up Appointments Wound #1 Left,Anterior Lower Leg o Return Appointment in 1 week. Edema Control Wound #1 Left,Anterior Lower Leg o Elevate legs to the level of the heart and pump ankles as often as possible Additional Orders / Instructions Wound #1 Left,Anterior Lower Leg o Increase protein intake. o Other: - Please add vitamin A, vitamin C and zinc supplements to your diet Patient Medications Allergies: No Known Drug Allergies Notifications Medication Indication Start End Alonso, Marialuiza P. ([id number]) Notifications Medication Indication  Start End Santyl DOSE topical 250 unit/gram ointment - ointment topical Electronic Signature(s) Signed: 10/01/2017 3:58:32 PM By: Christin Fudge MD, FACS Entered By: Christin Fudge on 10/01/2017 15:58:32 Brys, [name] ([id number]) -------------------------------------------------------------------------------- Problem List Details Patient Name: Alexa Lowes P. Date of Service: 10/01/2017 3:15 PM Medical Record Number: [id number] Patient Account Number: [id number] Date of Birth/Sex: 04-04-1926 (83 y.o. 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 ([id number]) -------------------------------------------------------------------------------- Progress Note Details Patient Name: Alexa Lowes P. Date of Service: 10/01/2017 3:15 PM Medical Record Number: [id number] Patient Account Number: [id number] Date of Birth/Sex: 04/06/1926 (83 y.o. Female) Treating RN: Carolyne Fiscal, Debi Primary Care Provider: Lucianne Lei Other Clinician: Referring Provider: Lucianne Lei Treating Provider/Extender: Frann Rider in Treatment: 18 Subjective Chief Complaint Information obtained from Patient Patients presents for treatment of an open diabetic ulcer 2 the left lower anterior leg which she sustained an injury by blunt trauma History of Present  Illness (HPI) The following HPI elements were documented for the patient's wound: Location: left anterior shin Quality: Patient reports experiencing a dull pain to affected area(s). Severity: Patient states wound are getting worse. Duration: Patient has had the wound for < 3 weeks prior to presenting for treatment Timing: Pain in wound is Intermittent (comes and goes Context: The wound occurred when the patient blunt trauma against her left shin Modifying Factors: Other treatment(s) tried include:active Bactroban ointment locally Associated Signs and Symptoms: Patient reports having: occasional discharge from the wound 83 year old patient was seen in the ED about 10 days ago for a history of abrasion to the left lower extremity while she was boarding a bus and scraped the  anterior part of her left leg. She has been a diabetic for many years and has been taking treatment regularly. Her past medical history is also significant for anemia arthritis constipation and hypertension and is status post abdominal hysterectomy. She has never been a smoker. after her ER visit she was advised to apply Bactroban ointment to the wound twice a day and continue to monitor her blood glucose levels. her last hemoglobin A1c was done 3 years ago and was 6.6% 06/04/17 patient left anterior shin wound appears to be doing well although it is still somewhat dry despite the treatment with Medihoney. We have been using Kerlex following the Medihoney application and I believe this is just not retaining that much moisture. Nonetheless there is no evidence of infection. 06/11/17 on evaluation today patient appears to be doing a little bit worse in regard to her wound. The entirety of the wound is dry and unfortunately the Medihoney does not seem to be helping this. That is even with the dressing changes that I made last week to try to retain more moisture. She has not tried [employer] as of yet. She was also noncompressible when  testing for ABIs. 07/01/2017 -- she had a arterial studies done and was seen by Dr. Lucky Cowboy. her noninvasive studies showed noncompressible vessels on the right but brisk waveforms and digital pressures of 71 on the right consistent with only mild arterial insufficienc. On the left her ABI was 0.47 and this may be falsely elevated due to calcification. No digital pressures were obtained on the left and this is consistent with severe arterial insufficiency. this critical limb threatening situation on the left, would make wound healing difficult and it represents a serious situation and he has recommended an angiography with possible revascularization. The patient desired to defer the decision to she discuss with the family members. 07/09/17 on evaluation today patient appears to be doing fairly well in regard to her left anterior lower extremity wound. She has been tolerating the dressing changes without complication we have been utilizing Santyl at this time. She tells me that she is having no significant pain compared to what she has had in the past. She has decided after discussing with family that she is going to go forward with the surgery with Dr. dew. This is to restore better blood flow to left lower extremity. With that it does appear that her lower extremity wound is making some progress albeit slow. No fevers, chills, nausea, or vomiting noted at this time. ROSSI, SILVESTRO ([id number]) 07/16/17 on evaluation today patient's wound appears to be doing roughly about the same although some of the slough is clearing off. [name] she has her appointment with vascular next week on Thursday the 23rd. With that being said her wound does not appear to be hurting as badly as it has in the past which is good news. No fevers, chills, nausea, or vomiting noted at this time. 07/30/2017 -- she had surgery on 07/22/2017 --indications being nonhealing ulcer on the left leg with a noninvasive study showing  marked reduction in ABI of less than 0.5 and no digital pressure on her left leg. she had a percutaneous transluminal angioplasty of the left common and external iliac arteries. She then had a stent placed to the left external iliac arteries and to the left common iliac artery. 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 The flow distally was poor and  there were multiple areas of high-grade stenosis or occlusion of the distal SFA and the popliteal artery with only a diseased peroneal artery as runoff distally. She has a postop appointment on September 23 08/20/2017 -- she went back to the vascular office for an appointment on 08/12/2017 and bilateral ABIs were done which were notable for moderate right lower extremity arterial disease and unable to obtain left ankle brachial indices due to absent Doppler velocities in the posterior tibial artery and the anterior tibial artery. Monophasic waveforms to the left femoral and popliteal artery. this was worrisome for a failure of stents placed and lower extremity angiogram was recommended. as was done on 08/16/2017 and the stents placed in the left eye Lex system had some mild narrowing at the iliac bifurcation and the leading edge of the stent proximally was poor wall apposition with stenosis in the 50-60% range in the right common iliac artery. There was also occlusion of the common femoral artery and the origin of the SFA and profunda femoris artery. Flow distally was poor but they appeared to be multiple areas of high-grade stenosis or occlusion in the distal SFA and popliteal artery with only a diseased peroneal artery. in another stent was placed and deployed. The femoral occlusion would need to be treated surgically. 08/27/17 on evaluation today patient appears to be doing about the same in regard to her left for extremity wound. Unfortunately secondary to her  vascular status this is not a very well healing wound at this time. She has been tolerating the dressing changes she has some discomfort but mainly with cleansing of the wound not at other times. No fevers, chills, nausea, or vomiting noted at this time. 09/17/2017 -- the patient was recently discharged from the hospital where she was admitted between 09/08/2017 and 09/10/2017, when she had a procedure on her left femoral artery with the endarterectomy and a patch. she will be going back for review this coming week. 09/24/2017 -- was seen on 09/18/2017 in the ER, for bleeding from her wound which was debrided earlier during the day. At the time she was seen there was no active bleeding. A thrombin pad was placed over the area and she was asked to change the outside dressing but come back to the wound center as planned. she was also seen at the [facility] office for a review after her surgery and bilateral lower extremity ABI when compared to the previous ones showed minimal improvement of arterial blood flow. She was set up for a CT angiogram of the abdomen and pelvis and lower extremities to see the degree of peripheral arterial disease Patient History Information obtained from Patient. Social History Never smoker, Marital Status - Separated, Alcohol Use - Never, Drug Use - No History, Caffeine Use - Rarely. Objective [name], [name] ([id number]) Constitutional Pulse regular. Respirations normal and unlabored. Afebrile. Vitals Time Taken: 3:03 PM, Height: 65 in, Weight: 131 lbs, BMI: 21.8, Temperature: 97.8 F, Pulse: 90 bpm, Respiratory Rate: 16 breaths/min, Blood Pressure: 141/77 mmHg. Eyes Nonicteric. Reactive to light. Ears, Nose, Mouth, and Throat Lips, teeth, and gums WNL.Marland Kitchen Moist mucosa without lesions. Neck supple and nontender. No palpable supraclavicular or cervical adenopathy. Normal sized without goiter. Respiratory WNL. No  retractions.. Cardiovascular Pedal Pulses WNL. No clubbing, cyanosis or edema. Lymphatic No adneopathy. No adenopathy. No adenopathy. Musculoskeletal Adexa without tenderness or enlargement.. Digits and nails w/o clubbing, cyanosis, infection, petechiae, ischemia, or inflammatory conditions.Marland Kitchen Psychiatric Judgement and insight Intact.. No evidence of  depression, anxiety, or agitation.. General Notes: the superficial necrotic debris was sharply removed with forceps and scissors and at the base in a couple of areas that is bone palpable. No bleeding noted Integumentary (Hair, Skin) No suspicious lesions. No crepitus or fluctuance. No peri-wound warmth or erythema. No masses.. Wound #1 status is Open. Original cause of wound was Trauma. The wound is located on the Left,Anterior Lower Leg. The wound measures 7.5cm length x 3.5cm width x 0.1cm depth; 20.617cm^2 area and 2.062cm^3 volume. There is Fat Layer (Subcutaneous Tissue) Exposed exposed. There is no tunneling or undermining noted. There is a large amount of serous drainage noted. The wound margin is flat and intact. There is no granulation within the wound bed. There is a large (67-100%) amount of necrotic tissue within the wound bed including Adherent Slough. The periwound skin appearance exhibited: Scarring, Maceration, Hemosiderin Staining, Erythema. The periwound skin appearance did not exhibit: Callus, Crepitus, Excoriation, Induration, Rash, Dry/Scaly, Atrophie Blanche, Cyanosis, Ecchymosis, Mottled, Pallor, Rubor. The surrounding wound skin color is noted with erythema which is circumferential. Periwound temperature was noted as No Abnormality. The periwound has tenderness on palpation. Assessment Active Problems ICD-10 E11.622 - Type 2 diabetes mellitus with other skin ulcer [name], [name] 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 There was a Skin/Subcutaneous Tissue Debridement (11042- 11047) debridement with total area of 4 sq cm performed by Christin Fudge, MD. with the following instrument(s): Forceps and Scissors to remove Viable and Non-Viable tissue/material including Exudate, Fibrin/Slough, and Subcutaneous after achieving pain control using Lidocaine 4% Topical Solution. A time out was conducted at 15:17, prior to the start of the procedure. A Minimum amount of bleeding was controlled with Pressure. The procedure was tolerated well with a pain level of 0 throughout and a pain level of 0 following the procedure. Post Debridement Measurements: 7.5cm length x 3.5cm width x 0.2cm depth; 4.123cm^3 volume. Character of Wound/Ulcer Post Debridement requires further debridement. Severity of Tissue Post Debridement is: Fat layer exposed. Post procedure Diagnosis Wound #1: Same as Pre-Procedure General Notes: note in 2 areas the bone is palpable. Plan Wound Cleansing: Wound #1 Left,Anterior Lower Leg: Clean wound with Normal Saline. May Shower, gently pat wound dry prior to applying new dressing. Anesthetic: Wound #1 Left,Anterior Lower Leg: Topical Lidocaine 4% cream applied to wound bed prior to debridement Primary Wound Dressing: Wound #1 Left,Anterior Lower Leg: Santyl Ointment Secondary Dressing: Wound #1 Left,Anterior Lower Leg: ABD pad Conform/Kerlix Non-adherent pad Other - stretch netting #4 Dressing Change Frequency: Wound #1 Left,Anterior Lower Leg: Change dressing every day. Follow-up Appointments: Wound #1 Left,Anterior Lower Leg: Return Appointment in 1 week. Edema Control: Wound #1 Left,Anterior Lower Leg: Elevate legs to the level of the heart and pump  ankles as often as possible Additional Orders / Instructions: Wound #1 Left,Anterior Lower Leg: Increase protein intake. [name], [name] ([id number]) Other: - Please add vitamin A, vitamin C and zinc supplements to your diet The following medication(s) was prescribed: Santyl topical 250 unit/gram ointment ointment topical she has been set up for a CT angiogram -- however her granddaughter did not have any idea about this and I have asked her to look into this. After review today,  I have recommended: 1. Daily washing with soap and water and application of Santyl ointment locally with a light Kerlix dressing. 2. good control of her diabetes mellitus 3. Adequate protein, vitamin A, vitamin C and zinc 4. Regular visits the wound center Electronic Signature(s) Signed: 10/01/2017 4:01:03 PM By: Christin Fudge MD, FACS Entered By: Christin Fudge on 10/01/2017 16:01:03 [name] ([id number]) -------------------------------------------------------------------------------- ROS/PFSH Details Patient Name: Alexa Lowes P. Date of Service: 10/01/2017 3:15 PM Medical Record Number: [id number] Patient Account Number: [id number] Date of Birth/Sex: [birth year] (83 y.o. Female) Treating RN: Carolyne Fiscal, Debi Primary Care Provider: Lucianne Lei Other Clinician: Referring Provider: Lucianne Lei Treating Provider/Extender: Frann Rider in Treatment: 18 Information Obtained From Patient Wound History Do you currently have one or more open woundso Yes How many open wounds do you currently haveo 1 Approximately how long have you had your woundso 3 weeks How have you been treating your wound(s) until nowo open to air Has your wound(s) ever healed and then re-openedo No Have you had any lab work done in the past montho No Have you tested positive for an antibiotic resistant organism (MRSA, VRE)o No Have you tested positive for osteomyelitis (bone infection)o No Have you had any tests for  circulation on your legso No Hematologic/Lymphatic Medical History: Positive for: Anemia Cardiovascular Medical History: Positive for: Hypertension Endocrine Medical History: Positive for: Type II Diabetes Treated with: Oral agents Musculoskeletal Medical History: Positive for: Osteoarthritis Oncologic Medical History: Negative for: Received Chemotherapy; Received Radiation Immunizations Pneumococcal Vaccine: Received Pneumococcal Vaccination: Yes Immunization Notes: up to date Implantable Devices Family and Social History ANASTASIYA, GOWIN ([id number]) Never smoker; Marital Status - Separated; Alcohol Use: Never; Drug Use: No History; Caffeine Use: Rarely; Financial Concerns: No; Food, Clothing or Shelter Needs: No; Support System Lacking: No; Transportation Concerns: No; Advanced Directives: No; Patient does not want information on Advanced Directives Physician Affirmation I have reviewed and agree with the above information. Electronic Signature(s) Signed: 10/01/2017 4:07:41 PM By: Alric Quan Signed: 10/01/2017 4:21:18 PM By: Christin Fudge MD, FACS Entered By: Christin Fudge on 10/01/2017 15:59:31 Radle, [name] ([id number]) -------------------------------------------------------------------------------- SuperBill Details Patient Name: Alexa Lowes P. Date of Service: 10/01/2017 Medical Record Number: [id number] Patient Account Number: [id number] Date of Birth/Sex: [birth year] (83 y.o. 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 Facility Procedures CPT4 Code:  [id number] Description: 07371 - DEB SUBQ TISSUE 20 SQ CM/< ICD-10 Diagnosis Description E11.622 Type 2 diabetes mellitus with other skin ulcer 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.242 Atherosclerosis of native arteries of left  leg with ulceration Modifier: of calf Quantity: 1 Physician Procedures CPT4 Code: [id number] Description: 11042 - WC PHYS SUBQ TISS 20 SQ CM ICD-10 Diagnosis Description E11.622 Type 2 diabetes mellitus with other skin ulcer 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.242 Atherosclerosis of native arteries of left  leg with ulceration Modifier: of calf Quantity: 1 Electronic Signature(s) Signed: 10/01/2017 4:01:17 PM By: Christin Fudge MD, FACS Entered By: Christin Fudge on 10/01/2017 16:01:16

## 2017-10-04 NOTE — Progress Notes (Signed)
[name], [name] ([id number]) Visit Report for 10/01/2017 Arrival Information Details Patient Name: [name], [name] Date of Service: 10/01/2017 3:15 PM Medical Record Number: [id number] Patient Account Number: [id number] Date of Birth/Sex: 04-16-1926 (83 y.o. Female) Treating RN: Carolyne Fiscal, Debi Primary Care Irvin Lizama: Lucianne Lei Other Clinician: Referring Richey Doolittle: Lucianne Lei Treating Lavert Matousek/Extender: Frann Rider in Treatment: 18 Visit Information History Since Last Visit All ordered tests and consults were completed: No Patient Arrived: [name] Added or deleted any medications: No Arrival Time: 15:02 Any new allergies or adverse reactions: No Accompanied By: granddaughter Had a fall or experienced change in No Transfer Assistance: EasyPivot Patient activities of daily living that may affect Lift risk of falls: Patient Identification Verified: Yes Signs or symptoms of abuse/neglect since last visito No Secondary Verification Process Yes Hospitalized since last visit: No Completed: Has Dressing in Place as Prescribed: Yes Patient Requires Transmission-Based No Precautions: Pain Present Now: No Patient Has Alerts: Yes Electronic Signature(s) Signed: 10/01/2017 4:07:41 PM By: Alric Quan Entered By: Alric Quan on 10/01/2017 15:03:23 Deasis, LXBWIOMB T. ([id number]) -------------------------------------------------------------------------------- Encounter Discharge Information Details Patient Name: Alexa Lowes P. Date of Service: 10/01/2017 3:15 PM Medical Record Number: [id number] Patient Account Number: [id number] Date of Birth/Sex: Apr 05, 1926 (83 y.o. Female) Treating RN: Carolyne Fiscal, Debi Primary Care Akire Rennert: Lucianne Lei Other Clinician: Referring Rogenia Werntz: Lucianne Lei Treating Meggin Ola/Extender: Frann Rider in Treatment: 18 Encounter Discharge Information Items Discharge Pain Level: 0 Discharge Condition: Stable Ambulatory  Status: Walker Discharge Destination: Home Transportation: Private Auto Accompanied By: granddaughter Schedule Follow-up Appointment: Yes Medication Reconciliation completed and No provided to Patient/Care Famous Eisenhardt: Provided on Clinical Summary of Care: 10/01/2017 Form Type Recipient Paper Patient Holt Signature(s) Signed: 10/04/2017 10:06:49 AM By: Ruthine Dose Previous Signature: 10/01/2017 3:14:57 PM Version By: Alric Quan Entered By: Ruthine Dose on 10/01/2017 15:31:04 [name], [name] ([id number]) -------------------------------------------------------------------------------- Lower Extremity Assessment Details Patient Name: Alexa Lowes P. Date of Service: 10/01/2017 3:15 PM Medical Record Number: [id number] Patient Account Number: [id number] Date of Birth/Sex: September 17, 1926 (83 y.o. Female) Treating RN: Carolyne Fiscal, Debi Primary Care Sayde Lish: Lucianne Lei Other Clinician: Referring Daxon Kyne: Lucianne Lei Treating Warden Buffa/Extender: Frann Rider in Treatment: 18 Vascular Assessment Pulses: Dorsalis Pedis Palpable: [Left:No] Doppler Audible: [Left:Yes] Posterior Tibial Extremity colors, hair growth, and conditions: Extremity Color: [Left:Hyperpigmented] Temperature of Extremity: [Left:Warm] Capillary Refill: [Left:< 3 seconds] Toe Nail Assessment Left: Right: Thick: Yes Discolored: No Deformed: No Improper Length and Hygiene: No Electronic Signature(s) Signed: 10/01/2017 4:07:41 PM By: Alric Quan Entered By: Alric Quan on 10/01/2017 15:16:52 [name], [name] ([id number]) -------------------------------------------------------------------------------- Multi Wound Chart Details Patient Name: Alexa Lowes P. Date of Service: 10/01/2017 3:15 PM Medical Record Number: [id number] Patient Account Number: [id number] Date of Birth/Sex: Nov 10, 1926 (83 y.o. Female) Treating RN: Carolyne Fiscal, Debi Primary Care Dashonna Chagnon: Lucianne Lei  Other Clinician: Referring Alithea Lapage: Lucianne Lei Treating Godwin Tedesco/Extender: Frann Rider in Treatment: 18 Vital Signs Height(in): 65 Pulse(bpm): 90 Weight(lbs): 131 Blood Pressure(mmHg): 141/77 Body Mass Index(BMI): 22 Temperature(F): 97.8 Respiratory Rate 16 (breaths/min): Photos: [1:No Photos] [N/A:N/A] Wound Location: [1:Left Lower Leg - Anterior] [N/A:N/A] Wounding Event: [1:Trauma] [N/A:N/A] Primary Etiology: [1:Diabetic Wound/Ulcer of the Lower Extremity] [N/A:N/A] Secondary Etiology: [1:Trauma, Other] [N/A:N/A] Comorbid History: [1:Anemia, Hypertension, Type II Diabetes, Osteoarthritis] [N/A:N/A] Date Acquired: [1:05/02/2017] [N/A:N/A] Weeks of Treatment: [1:18] [N/A:N/A] Wound Status: [1:Open] [N/A:N/A] Measurements L x W x D [1:7.5x3.5x0.1] [N/A:N/A] (cm) Area (cm) : [1:20.617] [N/A:N/A] Volume (cm) : [1:2.062] [N/A:N/A] % Reduction in Area: [1:-328.90%] [N/A:N/A] % Reduction in Volume: [1:-114.60%] [N/A:N/A] Classification: [1:Grade 2] [N/A:N/A] Exudate Amount: [  1:Large] [N/A:N/A] Exudate Type: [1:Serous] [N/A:N/A] Exudate Color: [1:amber] [N/A:N/A] Wound Margin: [1:Flat and Intact] [N/A:N/A] Granulation Amount: [1:None Present (0%)] [N/A:N/A] Necrotic Amount: [1:Large (67-100%)] [N/A:N/A] Exposed Structures: [1:Fat Layer (Subcutaneous Tissue) Exposed: Yes Fascia: No Tendon: No Muscle: No Joint: No Bone: No] [N/A:N/A] Epithelialization: [1:None] [N/A:N/A] Debridement: [1:Debridement ([id number])] [N/A:N/A] Pre-procedure [1:15:17] [N/A:N/A] Verification/Time Out Taken: Pain Control: [1:Lidocaine 4% Topical Solution] [N/A:N/A] Tissue Debrided: [N/A:N/A] Fibrin/Slough, Exudates, Subcutaneous Level: Skin/Subcutaneous Tissue N/A N/A Debridement Area (sq cm): 4 N/A N/A Instrument: Forceps, Scissors N/A N/A Bleeding: Minimum N/A N/A Hemostasis Achieved: Pressure N/A N/A Procedural Pain: 0 N/A N/A Post Procedural Pain: 0 N/A N/A Debridement  Treatment Procedure was tolerated well N/A N/A Response: Post Debridement 7.5x3.5x0.2 N/A N/A Measurements L x W x D (cm) Post Debridement Volume: 4.123 N/A N/A (cm) Periwound Skin Texture: Scarring: Yes N/A N/A Excoriation: No Induration: No Callus: No Crepitus: No Rash: No Periwound Skin Moisture: Maceration: Yes N/A N/A Dry/Scaly: No Periwound Skin Color: Erythema: Yes N/A N/A Hemosiderin Staining: Yes Atrophie Blanche: No Cyanosis: No Ecchymosis: No Mottled: No Pallor: No Rubor: No Erythema Location: Circumferential N/A N/A Temperature: No Abnormality N/A N/A Tenderness on Palpation: Yes N/A N/A Wound Preparation: Ulcer Cleansing: N/A N/A Rinsed/Irrigated with Saline Topical Anesthetic Applied: Other: lidocaine 4% Procedures Performed: Debridement N/A N/A Treatment Notes Wound #1 (Left, Anterior Lower Leg) 1. Cleansed with: Clean wound with Normal Saline 2. Anesthetic Topical Lidocaine 4% cream to wound bed prior to debridement 4. Dressing Applied: Santyl Ointment 5. Secondary Dressing Applied ABD Pad Kerlix/Conform Non-Adherent pad 7. Secured with Tape [name], [name] ([id number]) Notes netting Electronic Signature(s) Signed: 10/01/2017 3:58:51 PM By: Christin Fudge MD, FACS Previous Signature: 10/01/2017 3:14:06 PM Version By: Alric Quan Entered By: Christin Fudge on 10/01/2017 15:58:51 Cherian, [name] ([id number]) -------------------------------------------------------------------------------- [place] Details Patient Name: Alexa Lowes P. Date of Service: 10/01/2017 3:15 PM Medical Record Number: [id number] Patient Account Number: [id number] Date of Birth/Sex: 1926-02-15 (83 y.o. Female) Treating RN: Carolyne Fiscal, Debi Primary Care Jahlon Baines: Lucianne Lei Other Clinician: Referring Normand Damron: Lucianne Lei Treating Gracey Tolle/Extender: Frann Rider in Treatment: 70 Active Inactive ` Abuse / Safety / Falls /  Self Care Management Nursing Diagnoses: Potential for falls Goals: Patient will remain injury free related to falls Date Initiated: 05/28/2017 Target Resolution Date: 07/23/2017 Goal Status: Active Interventions: Assess fall risk on admission and as needed Notes: ` Orientation to the Wound Care Program Nursing Diagnoses: Knowledge deficit related to the wound healing center program Goals: Patient/caregiver will verbalize understanding of the [facility] Date Initiated: 05/28/2017 Target Resolution Date: 07/23/2017 Goal Status: Active Interventions: Provide education on orientation to the wound center Notes: ` Wound/Skin Impairment Nursing Diagnoses: Impaired tissue integrity Goals: Ulcer/skin breakdown will have a volume reduction of 30% by week 4 Date Initiated: 05/28/2017 Target Resolution Date: 07/23/2017 Goal Status: Active Ulcer/skin breakdown will have a volume reduction of 50% by week 8 Date Initiated: 05/28/2017 Target Resolution Date: 07/23/2017 [name], [name] ([id number]) Goal Status: Active Ulcer/skin breakdown will have a volume reduction of 80% by week 12 Date Initiated: 05/28/2017 Target Resolution Date: 07/23/2017 Goal Status: Active Ulcer/skin breakdown will heal within 14 weeks Date Initiated: 05/28/2017 Target Resolution Date: 07/23/2017 Goal Status: Active Interventions: Assess patient/caregiver ability to obtain necessary supplies Assess patient/caregiver ability to perform ulcer/skin care regimen upon admission and as needed Assess ulceration(s) every visit Notes: Electronic Signature(s) Signed: 10/01/2017 4:07:41 PM By: Alric Quan Previous Signature: 10/01/2017 3:13:58 PM Version By: Alric Quan Entered By: Alric Quan  on 10/01/2017 15:17:05 [name], [name] ([id number]) -------------------------------------------------------------------------------- Pain Assessment Details Patient Name: [name], Alexa  P. Date of Service: 10/01/2017 3:15 PM Medical Record Number: [id number] Patient Account Number: [id number] Date of Birth/Sex: January 13, 1926 (83 y.o. Female) Treating RN: Carolyne Fiscal, Debi Primary Care Takeria Marquina: Lucianne Lei Other Clinician: Referring Louis Gaw: Lucianne Lei Treating Shalene Gallen/Extender: Frann Rider in Treatment: 18 Active Problems Location of Pain Severity and Description of Pain Patient Has Paino No Site Locations Pain Management and Medication Current Pain Management: Electronic Signature(s) Signed: 10/01/2017 4:07:41 PM By: Alric Quan Entered By: Alric Quan on 10/01/2017 15:03:28 [name], [name] ([id number]) -------------------------------------------------------------------------------- Patient/Caregiver Education Details Patient Name: Alexa Lowes P. Date of Service: 10/01/2017 3:15 PM Medical Record Number: [id number] Patient Account Number: [id number] Date of Birth/Gender: February 01, 1926 (83 y.o. Female) Treating RN: Carolyne Fiscal, Debi Primary Care Physician: Lucianne Lei Other Clinician: Referring Physician: Lucianne Lei Treating Physician/Extender: Frann Rider in Treatment: 81 Education Assessment Education Provided To: Patient Education Topics Provided Wound/Skin Impairment: Handouts: Other: change dressing as ordered Methods: Demonstration, Explain/Verbal Responses: State content correctly Electronic Signature(s) Signed: 10/01/2017 4:07:41 PM By: Alric Quan Entered By: Alric Quan on 10/01/2017 15:15:11 [name], [name] ([id number]) -------------------------------------------------------------------------------- Wound Assessment Details Patient Name: Alexa Lowes P. Date of Service: 10/01/2017 3:15 PM Medical Record Number: [id number] Patient Account Number: [id number] Date of Birth/Sex: [birth year] (83 y.o. Female) Treating RN: Carolyne Fiscal, Debi Primary Care Wiley Flicker: Lucianne Lei Other Clinician: Referring  Eliot Bencivenga: Lucianne Lei Treating Shonda Mandarino/Extender: Frann Rider in Treatment: 18 Wound Status Wound Number: 1 Primary Etiology: Diabetic Wound/Ulcer of the Lower Extremity Wound Location: Left Lower Leg - Anterior Secondary Trauma, Other Wounding Event: Trauma Etiology: Date Acquired: 05/02/2017 Wound Status: Open Weeks Of Treatment: 18 Comorbid Anemia, Hypertension, Type II Diabetes, Clustered Wound: No History: Osteoarthritis Photos Photo Uploaded By: Alric Quan on 10/01/2017 16:04:39 Wound Measurements Length: (cm) 7.5 Width: (cm) 3.5 Depth: (cm) 0.1 Area: (cm) 20.617 Volume: (cm) 2.062 % Reduction in Area: -328.9% % Reduction in Volume: -114.6% Epithelialization: None Tunneling: No Undermining: No Wound Description Classification: Grade 2 Wound Margin: Flat and Intact Exudate Amount: Large Exudate Type: Serous Exudate Color: amber Foul Odor After Cleansing: No Slough/Fibrino Yes Wound Bed Granulation Amount: None Present (0%) Exposed Structure Necrotic Amount: Large (67-100%) Fascia Exposed: No Necrotic Quality: Adherent Slough Fat Layer (Subcutaneous Tissue) Exposed: Yes Tendon Exposed: No Muscle Exposed: No Joint Exposed: No Bone Exposed: No Periwound Skin Texture Mccormack, Asti P. ([id number]) Texture Color No Abnormalities Noted: No No Abnormalities Noted: No Callus: No Atrophie Blanche: No Crepitus: No Cyanosis: No Excoriation: No Ecchymosis: No Induration: No Erythema: Yes Rash: No Erythema Location: Circumferential Scarring: Yes Hemosiderin Staining: Yes Mottled: No Moisture Pallor: No No Abnormalities Noted: No Rubor: No Dry / Scaly: No Maceration: Yes Temperature / Pain Temperature: No Abnormality Tenderness on Palpation: Yes Wound Preparation Ulcer Cleansing: Rinsed/Irrigated with Saline Topical Anesthetic Applied: Other: lidocaine 4%, Treatment Notes Wound #1 (Left, Anterior Lower Leg) 1. Cleansed  with: Clean wound with Normal Saline 2. Anesthetic Topical Lidocaine 4% cream to wound bed prior to debridement 4. Dressing Applied: Santyl Ointment 5. Secondary Dressing Applied ABD Pad Kerlix/Conform Non-Adherent pad 7. Secured with Tape Notes netting Electronic Signature(s) Signed: 10/01/2017 4:07:41 PM By: Alric Quan Entered By: Alric Quan on 10/01/2017 15:10:03 Wentland, [name] ([id number]) -------------------------------------------------------------------------------- Vitals Details Patient Name: Alexa Lowes P. Date of Service: 10/01/2017 3:15 PM Medical Record Number: [id number] Patient Account Number: [id number] Date of Birth/Sex: [birth year] (83 y.o. Female) Treating RN: Carolyne Fiscal, Debi Primary Care Montzerrat Brunell: Lucianne Lei  Other Clinician: Referring Aleem Elza: Lucianne Lei Treating Rhanda Lemire/Extender: Frann Rider in Treatment: 18 Vital Signs Time Taken: 15:03 Temperature (F): 97.8 Height (in): 65 Pulse (bpm): 90 Weight (lbs): 131 Respiratory Rate (breaths/min): 16 Body Mass Index (BMI): 21.8 Blood Pressure (mmHg): 141/77 Reference Range: 80 - 120 mg / dl Electronic Signature(s) Signed: 10/01/2017 4:07:41 PM By: Alric Quan Entered By: Alric Quan on 10/01/2017 15:07:46

## 2017-10-06 ENCOUNTER — Other Ambulatory Visit (INDEPENDENT_AMBULATORY_CARE_PROVIDER_SITE_OTHER): Payer: Self-pay | Admitting: Vascular Surgery

## 2017-10-07 ENCOUNTER — Ambulatory Visit
Admission: RE | Admit: 2017-10-07 | Discharge: 2017-10-07 | Disposition: A | Discharge status: Home / Self Care | Payer: Medicare Other | Source: Ambulatory Visit | Attending: Surgery | Admitting: Surgery

## 2017-10-07 ENCOUNTER — Other Ambulatory Visit: Payer: Self-pay | Admitting: Surgery

## 2017-10-07 ENCOUNTER — Encounter: Payer: Medicare Other | Admitting: Surgery

## 2017-10-07 DIAGNOSIS — B999 Unspecified infectious disease: Secondary | ICD-10-CM | POA: Insufficient documentation

## 2017-10-07 DIAGNOSIS — E11622 Type 2 diabetes mellitus with other skin ulcer: Secondary | ICD-10-CM | POA: Diagnosis not present

## 2017-10-08 NOTE — Progress Notes (Addendum)
MARIGOLD, MOM ([id number]) Visit Report for 10/07/2017 Chief Complaint Document Details Patient Name: [name], [name] Date of Service: 10/07/2017 3:00 PM Medical Record Number: [id number] Patient Account Number: [id number] Date of Birth/Sex: 11/18/1926 (83 y.o. Female) Treating RN: Primary Care Provider: Lucianne Lei Other Clinician: Referring Provider: Lucianne Lei Treating Provider/Extender: Frann Rider in Treatment: 18 Information Obtained from: Patient Chief Complaint Patients presents for treatment of an open diabetic ulcer 2 the left lower anterior leg which she sustained an injury by blunt trauma Electronic Signature(s) Signed: 10/07/2017 3:41:10 PM By: Christin Fudge MD, FACS Entered By: Christin Fudge on 10/07/2017 15:41:09 [place], [place]. ([id number]) -------------------------------------------------------------------------------- HPI Details Patient Name: Alexa Lowes P. Date of Service: 10/07/2017 3:00 PM Medical Record Number: [id number] Patient Account Number: [id number] Date of Birth/Sex: 1926-01-19 (83 y.o. Female) Treating RN: Primary Care Provider: Lucianne Lei Other Clinician: Referring Provider: Lucianne Lei Treating Provider/Extender: Frann Rider in Treatment: 18 History of Present Illness Location: left anterior shin Quality: Patient reports experiencing a dull pain to affected area(s). Severity: Patient states wound are getting worse. Duration: Patient has had the wound for < 3 weeks prior to presenting for treatment Timing: Pain in wound is Intermittent (comes and goes Context: The wound occurred when the patient blunt trauma against her left shin Modifying Factors: Other treatment(s) tried include:active Bactroban ointment locally Associated Signs and Symptoms: Patient reports having: occasional discharge from the wound HPI Description: 83 year old patient was seen in the ED about 10 days ago for a history of abrasion to the left  lower extremity while she was boarding a bus and scraped the anterior part of her left leg. She has been a diabetic for many years and has been taking treatment regularly. Her past medical history is also significant for anemia arthritis constipation and hypertension and is status post abdominal hysterectomy. She has never been a smoker. after her ER visit she was advised to apply Bactroban ointment to the wound twice a day and continue to monitor her blood glucose levels. her last hemoglobin A1c was done 3 years ago and was 6.6% 06/04/17 patient left anterior shin wound appears to be doing well although it is still somewhat dry despite the treatment with Medihoney. We have been using Kerlex following the Medihoney application and I believe this is just not retaining that much moisture. Nonetheless there is no evidence of infection. 06/11/17 on evaluation today patient appears to be doing a little bit worse in regard to her wound. The entirety of the wound is dry and unfortunately the Medihoney does not seem to be helping this. That is even with the dressing changes that I made last week to try to retain more moisture. She has not tried [employer] as of yet. She was also noncompressible when testing for ABIs. 07/01/2017 -- she had a arterial studies done and was seen by Dr. Lucky Cowboy. her noninvasive studies showed noncompressible vessels on the right but brisk waveforms and digital pressures of 71 on the right consistent with only mild arterial insufficienc. On the left her ABI was 0.47 and this may be falsely elevated due to calcification. No digital pressures were obtained on the left and this is consistent with severe arterial insufficiency. this critical limb threatening situation on the left, would make wound healing difficult and it represents a serious situation and he has recommended an angiography with possible revascularization. The patient desired to defer the decision to she discuss with the  family members. 07/09/17 on evaluation today patient appears to be  doing fairly well in regard to her left anterior lower extremity wound. She has been tolerating the dressing changes without complication we have been utilizing Santyl at this time. She tells me that she is having no significant pain compared to what she has had in the past. She has decided after discussing with family that she is going to go forward with the surgery with Dr. dew. This is to restore better blood flow to left lower extremity. With that it does appear that her lower extremity wound is making some progress albeit slow. No fevers, chills, nausea, or vomiting noted at this time. 07/16/17 on evaluation today patient's wound appears to be doing roughly about the same although some of the slough is clearing off. [name] she has her appointment with vascular next week on Thursday the 23rd. With that being said her wound does not appear to be hurting as badly as it has in the past which is good news. No fevers, chills, nausea, or vomiting noted at this time. 07/30/2017 -- she had surgery on 07/22/2017 --indications being nonhealing ulcer on the left leg with a noninvasive study showing marked reduction in ABI of less than 0.5 and no digital pressure on her left leg. she had a percutaneous transluminal angioplasty of the left common and external iliac arteries. She then had a stent placed to the left external iliac arteries and to the left common iliac artery. the left lower extremity showed occlusion of the common femoral artery and the origins of the Collyer, China P. ([id number]) SFA and the profunda femoris artery. The flow distally was poor and there were multiple areas of high-grade stenosis or occlusion of the distal SFA and the popliteal artery with only a diseased peroneal artery as runoff distally. She has a postop appointment on September 23 08/20/2017 -- she went back to the vascular office for an appointment on  08/12/2017 and bilateral ABIs were done which were notable for moderate right lower extremity arterial disease and unable to obtain left ankle brachial indices due to absent Doppler velocities in the posterior tibial artery and the anterior tibial artery. Monophasic waveforms to the left femoral and popliteal artery. this was worrisome for a failure of stents placed and lower extremity angiogram was recommended. as was done on 08/16/2017 and the stents placed in the left eye Lex system had some mild narrowing at the iliac bifurcation and the leading edge of the stent proximally was poor wall apposition with stenosis in the 50-60% range in the right common iliac artery. There was also occlusion of the common femoral artery and the origin of the SFA and profunda femoris artery. Flow distally was poor but they appeared to be multiple areas of high-grade stenosis or occlusion in the distal SFA and popliteal artery with only a diseased peroneal artery. in another stent was placed and deployed. The femoral occlusion would need to be treated surgically. 08/27/17 on evaluation today patient appears to be doing about the same in regard to her left for extremity wound. Unfortunately secondary to her vascular status this is not a very well healing wound at this time. She has been tolerating the dressing changes she has some discomfort but mainly with cleansing of the wound not at other times. No fevers, chills, nausea, or vomiting noted at this time. 09/17/2017 -- the patient was recently discharged from the hospital where she was admitted between 09/08/2017 and 09/10/2017, when she had a procedure on her left femoral artery with the endarterectomy and a patch.  she will be going back for review this coming week. 09/24/2017 -- was seen on 09/18/2017 in the ER, for bleeding from her wound which was debrided earlier during the day. At the time she was seen there was no active bleeding. A thrombin pad was placed  over the area and she was asked to change the outside dressing but come back to the wound center as planned. she was also seen at the [facility] office for a review after her surgery and bilateral lower extremity ABI when compared to the previous ones showed minimal improvement of arterial blood flow. She was set up for a CT angiogram of the abdomen and pelvis and lower extremities to see the degree of peripheral arterial disease Electronic Signature(s) Signed: 10/07/2017 3:41:20 PM By: Christin Fudge MD, FACS Entered By: Christin Fudge on 10/07/2017 15:41:20 Ponds, [name] ([id number]) -------------------------------------------------------------------------------- Physical Exam Details Patient Name: Alexa Lowes P. Date of Service: 10/07/2017 3:00 PM Medical Record Number: [id number] Patient Account Number: [id number] Date of Birth/Sex: 02/08/1926 (83 y.o. Female) Treating RN: Primary Care Provider: Lucianne Lei Other Clinician: Referring Provider: Lucianne Lei Treating Provider/Extender: Frann Rider in Treatment: 18 Constitutional . Pulse regular. Respirations normal and unlabored. Afebrile. . Eyes Nonicteric. Reactive to light. Ears, Nose, Mouth, and Throat Lips, teeth, and gums WNL.Marland Kitchen Moist mucosa without lesions. Neck supple and nontender. No palpable supraclavicular or cervical adenopathy. Normal sized without goiter. Respiratory WNL. No retractions.. Cardiovascular Pedal Pulses WNL. No clubbing, cyanosis or edema. Lymphatic No adneopathy. No adenopathy. No adenopathy. Musculoskeletal Adexa without tenderness or enlargement.. Digits and nails w/o clubbing, cyanosis, infection, petechiae, ischemia, or inflammatory conditions.. Integumentary (Hair, Skin) No suspicious lesions. No crepitus or fluctuance. No peri-wound warmth or erythema. No masses.Marland Kitchen Psychiatric Judgement and insight Intact.. No evidence of depression, anxiety, or  agitation.. Notes no debridement was done today and there is significant amount of the tibial bone which is palpable on the left lower extremity. Electronic Signature(s) Signed: 10/07/2017 3:41:59 PM By: Christin Fudge MD, FACS Entered By: Christin Fudge on 10/07/2017 15:41:59 [name] ([id number]) -------------------------------------------------------------------------------- Physician Orders Details Patient Name: Alexa Lowes P. Date of Service: 10/07/2017 3:00 PM Medical Record Number: [id number] Patient Account Number: [id number] Date of Birth/Sex: 14-Feb-1926 (83 y.o. 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 Anesthetic Wound #1 Left,Anterior Lower Leg o Topical Lidocaine 4% cream applied to wound bed prior to debridement Primary Wound Dressing Wound #1 Left,Anterior Lower Leg o Santyl Ointment Secondary Dressing o ABD pad o Other - telfa, ABD pad, kerlix wrap and secure with tape Dressing Change Frequency o Change dressing every day. Follow-up Appointments Wound #1 Left,Anterior Lower Leg o Return Appointment in: - 3 weeks Electronic Signature(s) Signed: 10/07/2017 4:32:33 PM By: Christin Fudge MD, FACS Signed: 10/07/2017 5:22:35 PM By: Gretta Cool, BSN, RN, CWS, Kim RN, BSN Entered By: Gretta Cool, BSN, RN, CWS, Kim on 10/07/2017 15:49:04 Krupka, [name] ([id number]) -------------------------------------------------------------------------------- Problem  List Details Patient Name: [name], [name] Date of Service: 10/07/2017 3:00 PM Medical Record Number: [id number] Patient Account Number: [id number] Date of Birth/Sex: Jun 13, 1926 (  83 y.o. 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 ([id number]) -------------------------------------------------------------------------------- Progress Note Details Patient Name: Alexa Lowes P. Date of Service: 10/07/2017 3:00 PM Medical Record Number: [id number] Patient Account Number: [id number] Date of Birth/Sex: May 23, 1926 (83 y.o. Female) Treating RN: Primary Care Provider: Lucianne Lei Other Clinician: Referring Provider: Lucianne Lei Treating Provider/Extender: Frann Rider in Treatment: 18 Subjective Chief Complaint Information obtained from Patient Patients presents for treatment of an open diabetic ulcer 2 the left lower anterior leg which she sustained an injury by blunt trauma History of Present Illness (HPI) The following HPI elements were documented for the patient's wound: Location: left anterior shin Quality: Patient reports experiencing a dull pain to affected area(s). Severity: Patient states wound are getting worse. Duration: Patient has had the wound for < 3 weeks prior to presenting for treatment Timing: Pain in wound is Intermittent (comes and  goes Context: The wound occurred when the patient blunt trauma against her left shin Modifying Factors: Other treatment(s) tried include:active Bactroban ointment locally Associated Signs and Symptoms: Patient reports having: occasional discharge from the wound 83 year old patient was seen in the ED about 10 days ago for a history of abrasion to the left lower extremity while she was boarding a bus and scraped the anterior part of her left leg. She has been a diabetic for many years and has been taking treatment regularly. Her past medical history is also significant for anemia arthritis constipation and hypertension and is status post abdominal hysterectomy. She has never been a smoker. after her ER visit she was advised to apply Bactroban ointment to the wound twice a day and continue to monitor her blood glucose levels. her last hemoglobin A1c was done 3 years ago and was 6.6% 06/04/17 patient left anterior shin wound appears to be doing well although it is still somewhat dry despite the treatment with Medihoney. We have been using Kerlex following the Medihoney application and I believe this is just not retaining that much moisture. Nonetheless there is no evidence of infection. 06/11/17 on evaluation today patient appears to be doing a little bit worse in regard to her wound. The entirety of the wound is dry and unfortunately the Medihoney does not seem to be helping this. That is even with the dressing changes that I made last week to try to retain more moisture. She has not tried [employer] as of yet. She was also noncompressible when testing for ABIs. 07/01/2017 -- she had a arterial studies done and was seen by Dr. Lucky Cowboy. her noninvasive studies showed noncompressible vessels on the right but brisk waveforms and digital pressures of 71 on the right consistent with only mild arterial insufficienc. On the left her ABI was 0.47 and this may be falsely elevated due to calcification. No digital  pressures were obtained on the left and this is consistent with severe arterial insufficiency. this critical limb threatening situation on the left, would make wound healing difficult and it represents a serious situation and he has recommended  an angiography with possible revascularization. The patient desired to defer the decision to she discuss with the family members. 07/09/17 on evaluation today patient appears to be doing fairly well in regard to her left anterior lower extremity wound. She has been tolerating the dressing changes without complication we have been utilizing Santyl at this time. She tells me that she is having no significant pain compared to what she has had in the past. She has decided after discussing with family that she is going to go forward with the surgery with Dr. dew. This is to restore better blood flow to left lower extremity. With that it does appear that her lower extremity wound is making some progress albeit slow. No fevers, chills, nausea, or vomiting noted at this time. ZENITH, KERCHEVAL ([id number]) 07/16/17 on evaluation today patient's wound appears to be doing roughly about the same although some of the slough is clearing off. [name] she has her appointment with vascular next week on Thursday the 23rd. With that being said her wound does not appear to be hurting as badly as it has in the past which is good news. No fevers, chills, nausea, or vomiting noted at this time. 07/30/2017 -- she had surgery on 07/22/2017 --indications being nonhealing ulcer on the left leg with a noninvasive study showing marked reduction in ABI of less than 0.5 and no digital pressure on her left leg. she had a percutaneous transluminal angioplasty of the left common and external iliac arteries. She then had a stent placed to the left external iliac arteries and to the left common iliac artery. the left lower extremity showed occlusion of the common femoral artery and the origins  of the SFA and the profunda femoris artery. The flow distally was poor and there were multiple areas of high-grade stenosis or occlusion of the distal SFA and the popliteal artery with only a diseased peroneal artery as runoff distally. She has a postop appointment on September 23 08/20/2017 -- she went back to the vascular office for an appointment on 08/12/2017 and bilateral ABIs were done which were notable for moderate right lower extremity arterial disease and unable to obtain left ankle brachial indices due to absent Doppler velocities in the posterior tibial artery and the anterior tibial artery. Monophasic waveforms to the left femoral and popliteal artery. this was worrisome for a failure of stents placed and lower extremity angiogram was recommended. as was done on 08/16/2017 and the stents placed in the left eye Lex system had some mild narrowing at the iliac bifurcation and the leading edge of the stent proximally was poor wall apposition with stenosis in the 50-60% range in the right common iliac artery. There was also occlusion of the common femoral artery and the origin of the SFA and profunda femoris artery. Flow distally was poor but they appeared to be multiple areas of high-grade stenosis or occlusion in the distal SFA and popliteal artery with only a diseased peroneal artery. in another stent was placed and deployed. The femoral occlusion would need to be treated surgically. 08/27/17 on evaluation today patient appears to be doing about the same in regard to her left for extremity wound. Unfortunately secondary to her vascular status this is not a very well healing wound at this time. She has been tolerating the dressing changes she has some discomfort but mainly with cleansing of the wound not at other times. No fevers, chills, nausea, or vomiting noted at this time. 09/17/2017 -- the patient was recently discharged  from the hospital where she was admitted between 09/08/2017  and 09/10/2017, when she had a procedure on her left femoral artery with the endarterectomy and a patch. she will be going back for review this coming week. 09/24/2017 -- was seen on 09/18/2017 in the ER, for bleeding from her wound which was debrided earlier during the day. At the time she was seen there was no active bleeding. A thrombin pad was placed over the area and she was asked to change the outside dressing but come back to the wound center as planned. she was also seen at the [facility] office for a review after her surgery and bilateral lower extremity ABI when compared to the previous ones showed minimal improvement of arterial blood flow. She was set up for a CT angiogram of the abdomen and pelvis and lower extremities to see the degree of peripheral arterial disease Patient History Information obtained from Patient. Social History Never smoker, Marital Status - Separated, Alcohol Use - Never, Drug Use - No History, Caffeine Use - Rarely. Objective Constitutional Stormes, Tonetta P. ([id number]) Pulse regular. Respirations normal and unlabored. Afebrile. Vitals Time Taken: 3:20 PM, Height: 65 in, Weight: 131 lbs, BMI: 21.8, Temperature: 98.0 F, Pulse: 88 bpm, Respiratory Rate: 16 breaths/min, Blood Pressure: 148/60 mmHg. Eyes Nonicteric. Reactive to light. Ears, Nose, Mouth, and Throat Lips, teeth, and gums WNL.Marland Kitchen Moist mucosa without lesions. Neck supple and nontender. No palpable supraclavicular or cervical adenopathy. Normal sized without goiter. Respiratory WNL. No retractions.. Cardiovascular Pedal Pulses WNL. No clubbing, cyanosis or edema. Lymphatic No adneopathy. No adenopathy. No adenopathy. Musculoskeletal Adexa without tenderness or enlargement.. Digits and nails w/o clubbing, cyanosis, infection, petechiae, ischemia, or inflammatory conditions.Marland Kitchen Psychiatric Judgement and insight Intact.. No evidence of depression, anxiety, or  agitation.. General Notes: no debridement was done today and there is significant amount of the tibial bone which is palpable on the left lower extremity. Integumentary (Hair, Skin) No suspicious lesions. No crepitus or fluctuance. No peri-wound warmth or erythema. No masses.. Wound #1 status is Open. Original cause of wound was Trauma. The wound is located on the Left,Anterior Lower Leg. The wound measures 7.4cm length x 3.4cm width x 0.1cm depth; 19.761cm^2 area and 1.976cm^3 volume. There is bone, Fat Layer (Subcutaneous Tissue) Exposed, and fascia exposed. There is no tunneling or undermining noted. There is a large amount of serous drainage noted. The wound margin is flat and intact. There is no granulation within the wound bed. There is a large (67-100%) amount of necrotic tissue within the wound bed including Adherent Slough. The periwound skin appearance exhibited: Scarring, Maceration, Hemosiderin Staining, Erythema. The periwound skin appearance did not exhibit: Callus, Crepitus, Excoriation, Induration, Rash, Dry/Scaly, Atrophie Blanche, Cyanosis, Ecchymosis, Mottled, Pallor, Rubor. The surrounding wound skin color is noted with erythema which is circumferential. Periwound temperature was noted as No Abnormality. The periwound has tenderness on palpation. 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 ([id number]) Plan Wound Cleansing: Wound #1 Left,Anterior Lower Leg: Clean wound with Normal Saline. Anesthetic: Wound #1 Left,Anterior Lower Leg: Topical Lidocaine 4% cream applied to wound bed prior to debridement Primary Wound Dressing: Wound #1 Left,Anterior Lower Leg: Santyl Ointment Secondary Dressing: ABD pad Other - telfa, ABD pad, kerlix wrap and secure  with tape Dressing Change Frequency: Change  dressing every day. Follow-up Appointments: Wound #1 Left,Anterior Lower Leg: Return Appointment in: - 3 weeks She has been set up for a CT angiogram -- and they have now confirmed the date for this. After review today, I have recommended: 1. Daily washing with soap and water and application of Santyl ointment locally with a light Kerlix dressing. 2. we will get an x-ray of her left lower extremity to look for any involvement of the tibial bone with osteomyelitis 3. good control of her diabetes mellitus 4. Adequate protein, vitamin A, vitamin C and zinc 5. Regular visits the wound center Electronic Signature(s) Signed: 10/07/2017 4:31:28 PM By: Christin Fudge MD, FACS Previous Signature: 10/07/2017 3:43:11 PM Version By: Christin Fudge MD, FACS Entered By: Christin Fudge on 10/07/2017 16:31:28 [name] ([id number]) -------------------------------------------------------------------------------- ROS/PFSH Details Patient Name: Alexa Lowes P. Date of Service: 10/07/2017 3:00 PM Medical Record Number: [id number] Patient Account Number: [id number] Date of Birth/Sex: 02-17-1926 (83 y.o. Female) Treating RN: Primary Care Provider: Lucianne Lei Other Clinician: Referring Provider: Lucianne Lei Treating Provider/Extender: Frann Rider in Treatment: 18 Information Obtained From Patient Wound History Do you currently have one or more open woundso Yes How many open wounds do you currently haveo 1 Approximately how long have you had your woundso 3 weeks How have you been treating your wound(s) until nowo open to air Has your wound(s) ever healed and then re-openedo No Have you had any lab work done in the past montho No Have you tested positive for an antibiotic resistant organism (MRSA, VRE)o No Have you tested positive for osteomyelitis (bone infection)o No Have you had any tests for circulation on your legso  No Hematologic/Lymphatic Medical History: Positive for: Anemia Cardiovascular Medical History: Positive for: Hypertension Endocrine Medical History: Positive for: Type II Diabetes Treated with: Oral agents Musculoskeletal Medical History: Positive for: Osteoarthritis Oncologic Medical History: Negative for: Received Chemotherapy; Received Radiation Immunizations Pneumococcal Vaccine: Received Pneumococcal Vaccination: Yes Immunization Notes: up to date Implantable Devices Family and Social History [name], KUCHARSKI ([id number]) Never smoker; Marital Status - Separated; Alcohol Use: Never; Drug Use: No History; Caffeine Use: Rarely; Financial Concerns: No; Food, Clothing or Shelter Needs: No; Support System Lacking: No; Transportation Concerns: No; Advanced Directives: No; Patient does not want information on Advanced Directives Physician Affirmation I have reviewed and agree with the above information. Electronic Signature(s) Signed: 10/07/2017 4:32:33 PM By: Christin Fudge MD, FACS Entered By: Christin Fudge on 10/07/2017 15:41:27 Woolbright, [name] ([id number]) -------------------------------------------------------------------------------- SuperBill Details Patient Name: Alexa Lowes P. Date of Service: 10/07/2017 Medical Record Number: [id number] Patient Account Number: [id number] Date of Birth/Sex: 12/11/1925 (83 y.o. 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 Facility Procedures CPT4 Code: 81448185 Description: 63149 - WOUND CARE VISIT-LEV 2 EST PT Modifier: Quantity: 1 Physician  Procedures CPT4 Code: 7026378 Description: 58850 - WC PHYS LEVEL 3 - EST PT ICD-10 Diagnosis Description E11.622 Type 2 diabetes mellitus with other skin ulcer 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.242 Atherosclerosis of native arteries of left  leg with ulceration Modifier: of calf Quantity: 1 Electronic Signature(s) Signed: 10/07/2017 4:32:33 PM By: Christin Fudge MD, FACS Signed: 10/07/2017 5:22:35 PM By: Gretta Cool, BSN,  RN, CWS, Romie Minus, BSN Previous Signature: 10/07/2017 3:43:24 PM Version By: Christin Fudge MD, FACS Entered By: Gretta Cool, BSN, RN, CWS, Kim on 10/07/2017 15:51:19

## 2017-10-10 NOTE — Progress Notes (Signed)
[name], [name] ([id number]) Visit Report for 10/07/2017 Arrival Information Details Patient Name: [name], [name] Date of Service: 10/07/2017 3:00 PM Medical Record Number: [id number] Patient Account Number: [id number] Date of Birth/Sex: August 19, 1926 (83 y.o. Female) Treating RN: Carolyne Fiscal, Debi Primary Care Angelik Walls: Lucianne Lei Other Clinician: Referring Breton Berns: Lucianne Lei Treating Jashiya Bassett/Extender: Frann Rider in Treatment: 18 Visit Information History Since Last Visit All ordered tests and consults were completed: No Patient Arrived: [name] Added or deleted any medications: No Arrival Time: 15:19 Any new allergies or adverse reactions: No Accompanied By: granddaughter Had a fall or experienced change in No Transfer Assistance: EasyPivot Patient activities of daily living that may affect Lift risk of falls: Patient Identification Verified: Yes Signs or symptoms of abuse/neglect since last visito No Secondary Verification Process Yes Hospitalized since last visit: No Completed: Has Dressing in Place as Prescribed: Yes Patient Requires Transmission-Based No Precautions: Pain Present Now: Yes Patient Has Alerts: Yes Electronic Signature(s) Signed: 10/08/2017 4:36:47 PM By: Alric Quan Entered By: Alric Quan on 10/07/2017 15:20:36 Glacken, EXHBZJIR P. ([id number]) -------------------------------------------------------------------------------- Clinic Level of Care Assessment Details Patient Name: Alexa Lowes P. Date of Service: 10/07/2017 3:00 PM Medical Record Number: [id number] Patient Account Number: [id number] Date of Birth/Sex: July 24, 1926 (83 y.o. Female) Treating RN: Cornell Barman Primary Care Shanel Prazak: Lucianne Lei Other Clinician: Referring Audreana Hancox: Lucianne Lei Treating Alazne Quant/Extender: Frann Rider in Treatment: 18 Clinic Level of Care Assessment Items TOOL 4 Quantity Score []  - Use when only an EandM is performed on  FOLLOW-UP visit 0 ASSESSMENTS - Nursing Assessment / Reassessment X - Reassessment of Co-morbidities (includes updates in patient status) 1 10 X- 1 5 Reassessment of Adherence to Treatment Plan ASSESSMENTS - Wound and Skin Assessment / Reassessment X - Simple Wound Assessment / Reassessment - one wound 1 5 []  - 0 Complex Wound Assessment / Reassessment - multiple wounds []  - 0 Dermatologic / Skin Assessment (not related to wound area) ASSESSMENTS - Focused Assessment []  - Circumferential Edema Measurements - multi extremities 0 []  - 0 Nutritional Assessment / Counseling / Intervention []  - 0 Lower Extremity Assessment (monofilament, tuning fork, pulses) []  - 0 Peripheral Arterial Disease Assessment (using hand held doppler) ASSESSMENTS - Ostomy and/or Continence Assessment and Care []  - Incontinence Assessment and Management 0 []  - 0 Ostomy Care Assessment and Management (repouching, etc.) PROCESS - Coordination of Care X - Simple Patient / Family Education for ongoing care 1 15 []  - 0 Complex (extensive) Patient / Family Education for ongoing care []  - 0 Staff obtains Programmer, systems, Records, Test Results / Process Orders []  - 0 Staff telephones HHA, Nursing Homes / Clarify orders / etc []  - 0 Routine Transfer to another Facility (non-emergent condition) []  - 0 Routine Hospital Admission (non-emergent condition) []  - 0 New Admissions / Biomedical engineer / Ordering NPWT, Apligraf, etc. []  - 0 Emergency Hospital Admission (emergent condition) X- 1 10 Simple Discharge Coordination Nestor, [name] ([id number]) []  - 0 Complex (extensive) Discharge Coordination PROCESS - Special Needs []  - Pediatric / Minor Patient Management 0 []  - 0 Isolation Patient Management []  - 0 Hearing / Language / Visual special needs []  - 0 Assessment of Community assistance (transportation, D/C planning, etc.) []  - 0 Additional assistance / Altered mentation []  - 0 Support Surface(s)  Assessment (bed, cushion, seat, etc.) INTERVENTIONS - Wound Cleansing / Measurement X - Simple Wound Cleansing - one wound 1 5 []  - 0 Complex Wound Cleansing - multiple wounds X- 1 5 Wound Imaging (photographs -  any number of wounds) []  - 0 Wound Tracing (instead of photographs) X- 1 5 Simple Wound Measurement - one wound []  - 0 Complex Wound Measurement - multiple wounds INTERVENTIONS - Wound Dressings X - Small Wound Dressing one or multiple wounds 1 10 []  - 0 Medium Wound Dressing one or multiple wounds []  - 0 Large Wound Dressing one or multiple wounds X- 1 5 Application of Medications - topical []  - 0 Application of Medications - injection INTERVENTIONS - Miscellaneous []  - External ear exam 0 []  - 0 Specimen Collection (cultures, biopsies, blood, body fluids, etc.) []  - 0 Specimen(s) / Culture(s) sent or taken to Lab for analysis []  - 0 Patient Transfer (multiple staff / Civil Service fast streamer / Similar devices) []  - 0 Simple Staple / Suture removal (25 or less) []  - 0 Complex Staple / Suture removal (26 or more) []  - 0 Hypo / Hyperglycemic Management (close monitor of Blood Glucose) []  - 0 Ankle / Brachial Index (ABI) - do not check if billed separately X- 1 5 Vital Signs Lukins, [name] ([id number]) Has the patient been seen at the hospital within the last three years: Yes Total Score: 80 Level Of Care: New/Established - Level 3 Electronic Signature(s) Signed: 10/07/2017 5:22:35 PM By: Gretta Cool, BSN, RN, CWS, Kim RN, BSN Entered By: Gretta Cool, BSN, RN, CWS, Kim on 10/07/2017 15:51:05 Boening, [name] ([id number]) -------------------------------------------------------------------------------- Encounter Discharge Information Details Patient Name: Alexa Lowes P. Date of Service: 10/07/2017 3:00 PM Medical Record Number: [id number] Patient Account Number: [id number] Date of Birth/Sex: 1926/01/16 (83 y.o. Female) Treating RN: Cornell Barman Primary Care Jemarcus Dougal: Lucianne Lei Other Clinician: Referring Naelani Lafrance: Lucianne Lei Treating Orren Pietsch/Extender: Frann Rider in Treatment: 57 Encounter Discharge Information Items Discharge Pain Level: 0 Discharge Condition: Stable Ambulatory Status: Walker Discharge Destination: Home Transportation: Private Auto grand Accompanied By: daughter Schedule Follow-up Appointment: Yes Medication Reconciliation completed and Yes provided to Patient/Care Antinio Sanderfer: Clinical Summary of Care: Electronic Signature(s) Signed: 10/07/2017 5:22:35 PM By: Gretta Cool, BSN, RN, CWS, Kim RN, BSN Entered By: Gretta Cool, BSN, RN, CWS, Kim on 10/07/2017 15:53:40 Gervin, [name] ([id number]) -------------------------------------------------------------------------------- Lower Extremity Assessment Details Patient Name: Alexa Lowes P. Date of Service: 10/07/2017 3:00 PM Medical Record Number: [id number] Patient Account Number: [id number] Date of Birth/Sex: [birth year] (83 y.o. Female) Treating RN: Carolyne Fiscal, Debi Primary Care Alizzon Dioguardi: Lucianne Lei Other Clinician: Referring Harleen Fineberg: Lucianne Lei Treating Dois Juarbe/Extender: Frann Rider in Treatment: 18 Vascular Assessment Pulses: Dorsalis Pedis Palpable: [Left:Yes] Posterior Tibial Extremity colors, hair growth, and conditions: Extremity Color: [Left:Hyperpigmented] Temperature of Extremity: [Left:Warm] Capillary Refill: [Left:< 3 seconds] Toe Nail Assessment Left: Right: Thick: Yes Discolored: Yes Deformed: No Improper Length and Hygiene: No Electronic Signature(s) Signed: 10/08/2017 4:36:47 PM By: Alric Quan Entered By: Alric Quan on 10/07/2017 15:26:22 Vanaman, Larna P. ([id number]) -------------------------------------------------------------------------------- Multi Wound Chart Details Patient Name: Alexa Lowes P. Date of Service: 10/07/2017 3:00 PM Medical Record Number: [id number] Patient Account Number: [id number] Date of  Birth/Sex: 14-Apr-1926 (83 y.o. Female) Treating RN: Primary Care Aleatha Taite: Lucianne Lei Other Clinician: Referring Donnelle Rubey: Lucianne Lei Treating Cayson Kalb/Extender: Frann Rider in Treatment: 18 Vital Signs Height(in): 65 Pulse(bpm): 39 Weight(lbs): 131 Blood Pressure(mmHg): 148/60 Body Mass Index(BMI): 22 Temperature(F): 98.0 Respiratory Rate 16 (breaths/min): Photos: [1:No Photos] [N/A:N/A] Wound Location: [1:Left Lower Leg - Anterior] [N/A:N/A] Wounding Event: [1:Trauma] [N/A:N/A] Primary Etiology: [1:Diabetic Wound/Ulcer of the Lower Extremity] [N/A:N/A] Secondary Etiology: [1:Trauma, Other] [N/A:N/A] Comorbid History: [1:Anemia, Hypertension, Type II Diabetes, Osteoarthritis] [N/A:N/A] Date Acquired: [1:05/02/2017] [N/A:N/A] Weeks of Treatment: [1:18] [N/A:N/A]  Wound Status: [1:Open] [N/A:N/A] Measurements L x W x D [1:7.4x3.4x0.1] [N/A:N/A] (cm) Area (cm) : [1:19.761] [N/A:N/A] Volume (cm) : [1:1.976] [N/A:N/A] % Reduction in Area: [1:-311.10%] [N/A:N/A] % Reduction in Volume: [1:-105.60%] [N/A:N/A] Classification: [1:Grade 2] [N/A:N/A] Exudate Amount: [1:Large] [N/A:N/A] Exudate Type: [1:Serous] [N/A:N/A] Exudate Color: [1:amber] [N/A:N/A] Wound Margin: [1:Flat and Intact] [N/A:N/A] Granulation Amount: [1:None Present (0%)] [N/A:N/A] Necrotic Amount: [1:Large (67-100%)] [N/A:N/A] Exposed Structures: [1:Fascia: Yes Fat Layer (Subcutaneous Tissue) Exposed: Yes Bone: Yes Tendon: No Muscle: No Joint: No] [N/A:N/A] Epithelialization: [1:None] [N/A:N/A] Periwound Skin Texture: [1:Scarring: Yes Excoriation: No Induration: No Callus: No] [N/A:N/A] Crepitus: No Rash: No Periwound Skin Moisture: Maceration: Yes N/A N/A Dry/Scaly: No Periwound Skin Color: Erythema: Yes N/A N/A Hemosiderin Staining: Yes Atrophie Blanche: No Cyanosis: No Ecchymosis: No Mottled: No Pallor: No Rubor: No Erythema Location: Circumferential N/A N/A Temperature: No Abnormality  N/A N/A Tenderness on Palpation: Yes N/A N/A Wound Preparation: Ulcer Cleansing: N/A N/A Rinsed/Irrigated with Saline Topical Anesthetic Applied: Other: lidocaine 4% Treatment Notes Electronic Signature(s) Signed: 10/07/2017 3:41:04 PM By: Christin Fudge MD, FACS Entered By: Christin Fudge on 10/07/2017 15:41:03 Dimalanta, [name] ([id number]) -------------------------------------------------------------------------------- [place] Details Patient Name: Alexa Lowes P. Date of Service: 10/07/2017 3:00 PM Medical Record Number: [id number] Patient Account Number: [id number] Date of Birth/Sex: [birth year] (83 y.o. Female) Treating RN: Cornell Barman Primary Care Alexxa Sabet: Lucianne Lei Other Clinician: Referring Council Munguia: Lucianne Lei Treating Geet Hosking/Extender: Frann Rider in Treatment: 51 Active Inactive ` Abuse / Safety / Falls / Self Care Management Nursing Diagnoses: Potential for falls Goals: Patient will remain injury free related to falls Date Initiated: 05/28/2017 Target Resolution Date: 07/23/2017 Goal Status: Active Interventions: Assess fall risk on admission and as needed Notes: ` Orientation to the Wound Care Program Nursing Diagnoses: Knowledge deficit related to the wound healing center program Goals: Patient/caregiver will verbalize understanding of the [facility] Date Initiated: 05/28/2017 Target Resolution Date: 07/23/2017 Goal Status: Active Interventions: Provide education on orientation to the wound center Notes: ` Wound/Skin Impairment Nursing Diagnoses: Impaired tissue integrity Goals: Ulcer/skin breakdown will have a volume reduction of 30% by week 4 Date Initiated: 05/28/2017 Target Resolution Date: 07/23/2017 Goal Status: Active Ulcer/skin breakdown will have a volume reduction of 50% by week 8 Date Initiated: 05/28/2017 Target Resolution Date: 07/23/2017 [name], [name] ([id number]) Goal Status:  Active Ulcer/skin breakdown will have a volume reduction of 80% by week 12 Date Initiated: 05/28/2017 Target Resolution Date: 07/23/2017 Goal Status: Active Ulcer/skin breakdown will heal within 14 weeks Date Initiated: 05/28/2017 Target Resolution Date: 07/23/2017 Goal Status: Active Interventions: Assess patient/caregiver ability to obtain necessary supplies Assess patient/caregiver ability to perform ulcer/skin care regimen upon admission and as needed Assess ulceration(s) every visit Notes: Electronic Signature(s) Signed: 10/07/2017 5:22:35 PM By: Gretta Cool, BSN, RN, CWS, Kim RN, BSN Entered By: Gretta Cool, BSN, RN, CWS, Kim on 10/07/2017 15:32:53 Benge, [name] ([id number]) -------------------------------------------------------------------------------- Pain Assessment Details Patient Name: Alexa Lowes P. Date of Service: 10/07/2017 3:00 PM Medical Record Number: [id number] Patient Account Number: [id number] Date of Birth/Sex: March 27, 1926 (83 y.o. Female) Treating RN: Carolyne Fiscal, Debi Primary Care Fredis Malkiewicz: Lucianne Lei Other Clinician: Referring Saleen Peden: Lucianne Lei Treating Barrie Sigmund/Extender: Frann Rider in Treatment: 18 Active Problems Location of Pain Severity and Description of Pain Patient Has Paino Yes Site Locations Rate the pain. Current Pain Level: 1 Character of Pain Describe the Pain: Aching Pain Management and Medication Current Pain Management: Electronic Signature(s) Signed: 10/08/2017 4:36:47 PM By: Alric Quan Entered By: Alric Quan on 10/07/2017 15:20:46  [name], [name] ([id number]) -------------------------------------------------------------------------------- Patient/Caregiver Education Details Patient Name: [name], [name] Date of Service: 10/07/2017 3:00 PM Medical Record Number: [id number] Patient Account Number: [id number] Date of Birth/Gender: 1926-09-18 (83 y.o. Female) Treating RN: Cornell Barman Primary Care Physician:  Lucianne Lei Other Clinician: Referring Physician: Lucianne Lei Treating Physician/Extender: Frann Rider in Treatment: 81 Education Assessment Education Provided To: Patient Education Topics Provided Wound/Skin Impairment: Handouts: Caring for Your Ulcer Methods: Explain/Verbal Responses: State content correctly Electronic Signature(s) Signed: 10/07/2017 5:22:35 PM By: Gretta Cool, BSN, RN, CWS, Kim RN, BSN Entered By: Gretta Cool, BSN, RN, CWS, Kim on 10/07/2017 15:54:04 Nevers, [name] ([id number]) -------------------------------------------------------------------------------- Wound Assessment Details Patient Name: Alexa Lowes P. Date of Service: 10/07/2017 3:00 PM Medical Record Number: [id number] Patient Account Number: [id number] Date of Birth/Sex: Jun 20, 1926 (83 y.o. Female) Treating RN: Carolyne Fiscal, Debi Primary Care Naethan Bracewell: Lucianne Lei Other Clinician: Referring Kadeidra Coryell: Lucianne Lei Treating Tereasa Yilmaz/Extender: Frann Rider in Treatment: 18 Wound Status Wound Number: 1 Primary Etiology: Diabetic Wound/Ulcer of the Lower Extremity Wound Location: Left Lower Leg - Anterior Secondary Trauma, Other Wounding Event: Trauma Etiology: Date Acquired: 05/02/2017 Wound Status: Open Weeks Of Treatment: 18 Comorbid Anemia, Hypertension, Type II Diabetes, Clustered Wound: No History: Osteoarthritis Photos Photo Uploaded By: Gretta Cool, BSN, RN, CWS, Kim on 10/07/2017 17:31:50 Wound Measurements Length: (cm) 7.4 Width: (cm) 3.4 Depth: (cm) 0.1 Area: (cm) 19.761 Volume: (cm) 1.976 % Reduction in Area: -311.1% % Reduction in Volume: -105.6% Epithelialization: None Tunneling: No Undermining: No Wound Description Classification: Grade 2 Foul Odo Wound Margin: Flat and Intact Slough/F Exudate Amount: Large Exudate Type: Serous Exudate Color: amber r After Cleansing: No ibrino Yes Wound Bed Granulation Amount: None Present (0%) Exposed Structure Necrotic  Amount: Large (67-100%) Fascia Exposed: Yes Necrotic Quality: Adherent Slough Fat Layer (Subcutaneous Tissue) Exposed: Yes Tendon Exposed: No Muscle Exposed: No Joint Exposed: No Bone Exposed: Yes Periwound Skin Texture Texture Color No Abnormalities Noted: No No Abnormalities Noted: No Pape, [name] ([id number]) Callus: No Atrophie Blanche: No Crepitus: No Cyanosis: No Excoriation: No Ecchymosis: No Induration: No Erythema: Yes Rash: No Erythema Location: Circumferential Scarring: Yes Hemosiderin Staining: Yes Mottled: No Moisture Pallor: No No Abnormalities Noted: No Rubor: No Dry / Scaly: No Maceration: Yes Temperature / Pain Temperature: No Abnormality Tenderness on Palpation: Yes Wound Preparation Ulcer Cleansing: Rinsed/Irrigated with Saline Topical Anesthetic Applied: Other: lidocaine 4%, Treatment Notes Wound #1 (Left, Anterior Lower Leg) 1. Cleansed with: Clean wound with Normal Saline 2. Anesthetic Topical Lidocaine 4% cream to wound bed prior to debridement 4. Dressing Applied: Santyl Ointment 5. Secondary [place] Notes kerlix wrap and Horticulturist, commercial) Signed: 10/07/2017 5:22:35 PM By: Gretta Cool, BSN, RN, CWS, Kim RN, BSN Signed: 10/08/2017 4:36:47 PM By: Alric Quan Entered By: Gretta Cool BSN, RN, CWS, Kim on 10/07/2017 15:33:51 Buerkle, [name] ([id number]) -------------------------------------------------------------------------------- Vitals Details Patient Name: Alexa Lowes P. Date of Service: 10/07/2017 3:00 PM Medical Record Number: [id number] Patient Account Number: [id number] Date of Birth/Sex: 24-May-1926 (83 y.o. Female) Treating RN: Carolyne Fiscal, Debi Primary Care Lonnie Reth: Lucianne Lei Other Clinician: Referring Candia Kingsbury: Lucianne Lei Treating Olando Willems/Extender: Frann Rider in Treatment: 18 Vital Signs Time Taken: 15:20 Temperature (F): 98.0 Height (in): 65 Pulse (bpm):  88 Weight (lbs): 131 Respiratory Rate (breaths/min): 16 Body Mass Index (BMI): 21.8 Blood Pressure (mmHg): 148/60 Reference Range: 80 - 120 mg / dl Electronic Signature(s) Signed: 10/08/2017 4:36:47 PM By: Alric Quan Entered By: Alric Quan on 10/07/2017 15:22:40

## 2017-10-19 ENCOUNTER — Other Ambulatory Visit (INDEPENDENT_AMBULATORY_CARE_PROVIDER_SITE_OTHER): Payer: Self-pay | Admitting: Vascular Surgery

## 2017-10-19 ENCOUNTER — Ambulatory Visit
Admission: RE | Admit: 2017-10-19 | Discharge: 2017-10-19 | Disposition: A | Discharge status: Home / Self Care | Payer: Medicare Other | Source: Ambulatory Visit | Attending: Vascular Surgery | Admitting: Vascular Surgery

## 2017-10-19 DIAGNOSIS — Z95820 Peripheral vascular angioplasty status with implants and grafts: Secondary | ICD-10-CM | POA: Diagnosis not present

## 2017-10-19 DIAGNOSIS — R911 Solitary pulmonary nodule: Secondary | ICD-10-CM | POA: Diagnosis not present

## 2017-10-19 DIAGNOSIS — I739 Peripheral vascular disease, unspecified: Secondary | ICD-10-CM

## 2017-10-19 DIAGNOSIS — K8020 Calculus of gallbladder without cholecystitis without obstruction: Secondary | ICD-10-CM | POA: Diagnosis not present

## 2017-10-19 DIAGNOSIS — I2510 Atherosclerotic heart disease of native coronary artery without angina pectoris: Secondary | ICD-10-CM | POA: Diagnosis not present

## 2017-10-19 DIAGNOSIS — I700 Atherosclerosis of aorta: Secondary | ICD-10-CM | POA: Insufficient documentation

## 2017-10-19 MED ORDER — IOPAMIDOL (ISOVUE-370) INJECTION 76%
100.0000 mL | Freq: Once | INTRAVENOUS | Status: AC | PRN
  Administered 2017-10-19: 100 mL via INTRAVENOUS

## 2017-10-24 ENCOUNTER — Emergency Department (HOSPITAL_COMMUNITY)
Admission: EM | Admit: 2017-10-24 | Discharge: 2017-10-24 | Disposition: A | Discharge status: Home / Self Care | Payer: Medicare Other | Attending: Emergency Medicine | Admitting: Emergency Medicine

## 2017-10-24 ENCOUNTER — Other Ambulatory Visit: Payer: Self-pay

## 2017-10-24 ENCOUNTER — Encounter (HOSPITAL_COMMUNITY): Payer: Self-pay

## 2017-10-24 DIAGNOSIS — Y92019 Unspecified place in single-family (private) house as the place of occurrence of the external cause: Secondary | ICD-10-CM | POA: Insufficient documentation

## 2017-10-24 DIAGNOSIS — I10 Essential (primary) hypertension: Secondary | ICD-10-CM | POA: Diagnosis not present

## 2017-10-24 DIAGNOSIS — Y9389 Activity, other specified: Secondary | ICD-10-CM | POA: Diagnosis not present

## 2017-10-24 DIAGNOSIS — S99922A Unspecified injury of left foot, initial encounter: Secondary | ICD-10-CM | POA: Diagnosis present

## 2017-10-24 DIAGNOSIS — Z7902 Long term (current) use of antithrombotics/antiplatelets: Secondary | ICD-10-CM | POA: Insufficient documentation

## 2017-10-24 DIAGNOSIS — S91312A Laceration without foreign body, left foot, initial encounter: Secondary | ICD-10-CM | POA: Insufficient documentation

## 2017-10-24 DIAGNOSIS — E119 Type 2 diabetes mellitus without complications: Secondary | ICD-10-CM | POA: Diagnosis not present

## 2017-10-24 DIAGNOSIS — Z8673 Personal history of transient ischemic attack (TIA), and cerebral infarction without residual deficits: Secondary | ICD-10-CM | POA: Diagnosis not present

## 2017-10-24 DIAGNOSIS — R200 Anesthesia of skin: Secondary | ICD-10-CM | POA: Insufficient documentation

## 2017-10-24 DIAGNOSIS — Y998 Other external cause status: Secondary | ICD-10-CM | POA: Insufficient documentation

## 2017-10-24 DIAGNOSIS — Z79899 Other long term (current) drug therapy: Secondary | ICD-10-CM | POA: Diagnosis not present

## 2017-10-24 DIAGNOSIS — Z7984 Long term (current) use of oral hypoglycemic drugs: Secondary | ICD-10-CM | POA: Insufficient documentation

## 2017-10-24 DIAGNOSIS — Z7982 Long term (current) use of aspirin: Secondary | ICD-10-CM | POA: Diagnosis not present

## 2017-10-24 DIAGNOSIS — X58XXXA Exposure to other specified factors, initial encounter: Secondary | ICD-10-CM | POA: Insufficient documentation

## 2017-10-24 NOTE — ED Provider Notes (Signed)
[facility] DEPT Provider Note   CSN: [id number] Arrival date & time: 10/24/17  1612     History   Chief Complaint Chief Complaint  Patient presents with  . Extremity Laceration    L    HPI [name] is a 83 y.o. female with past medical history of chronic ulcer to left calf, DM, PVD, who presents with injury to left big toe that started this afternoon.  Patient reports that she was taking a bath, and noticed some dry skin.  She attempted to remove the dried skin and resulted in a skin tear to the plantar surface of the left first toe.  Patient reports that the area continued to bleed, prompting concern.  Patient's family saw him on blood and brought her to the emergency department.  She is currently on Plavix.  Patient has a history of PVD and has a chronic ulcer to the left calf.  Patient reports that she has been cleaning the bandage.  She has seen wound care doctor for evaluation of the wound and her next appointment is in 2 days.  She denies any issues with that wound.  Patient denies any trauma, injury, fall.  Patient denies any pain of the left foot.  Patient does have some intermittent left foot numbness but this is not new.  Patient denies any fevers, chest pain, difficulty breathing, abdominal pain.  The history is provided by the patient.    Past Medical History:  Diagnosis Date  . Anemia    family unsure of this history  . Arthritis   . Chronic ulcer of calf (HCC)    Left  . Constipation   . Diabetes mellitus without complication ([place])   . Dyslipidemia   . GERD (gastroesophageal reflux disease)   . Hypertension   . Peripheral vascular disease [facility])     Patient Active Problem List   Diagnosis Date Noted  . PAD (peripheral artery disease) ([place]) 09/21/2017  . Atherosclerosis of artery of extremity with ulceration ([place]) 09/08/2017  . Atherosclerosis of native arteries of the extremities with ulceration ([place]) 06/25/2017  .  Stroke ([place]) 12/31/2013  . TIA (transient ischemic attack) 12/31/2013  . Acute ischemic stroke ([place]) 12/31/2013  . ARF (acute renal failure) ([place]) 12/31/2013  . Hypertension   . Diabetes mellitus without complication ([place])   . Dyslipidemia   . Constipation   . Anemia     Past Surgical History:  Procedure Laterality Date  . ABDOMINAL HYSTERECTOMY    . ENDARTERECTOMY FEMORAL Left 09/08/2017   Procedure: ENDARTERECTOMY FEMORAL WITH CORMATRIX PATCH;  Surgeon: Algernon Huxley, MD;  Location: [facility] ORS;  Service: Vascular;  Laterality: Left;  . EYE SURGERY Bilateral    Cataract Extraction with IOL  . LOWER EXTREMITY ANGIOGRAPHY Left 07/22/2017   Procedure: Lower Extremity Angiography;  Surgeon: Algernon Huxley, MD;  Location: [place] CV LAB;  Service: Cardiovascular;  Laterality: Left;  . LOWER EXTREMITY ANGIOGRAPHY Left 08/16/2017   Procedure: Lower Extremity Angiography;  Surgeon: Algernon Huxley, MD;  Location: [place] CV LAB;  Service: Cardiovascular;  Laterality: Left;  Marland Kitchen MULTIPLE TOOTH EXTRACTIONS      OB History    No data available       Home Medications    Prior to Admission medications   Medication Sig Start Date End Date Taking? Authorizing Provider  amLODipine (NORVASC) 10 MG tablet Take 10 mg by mouth daily.    [provider]  aspirin EC 81 MG  tablet Take 81 mg by mouth daily.    [provider]  atorvastatin (LIPITOR) 10 MG tablet Take 10 mg by mouth daily.    [provider]  Calcium-Magnesium-Zinc (CAL-MAG-ZINC PO) Take 1 tablet by mouth daily.    [provider]  carvedilol (COREG) 6.25 MG tablet Take 6.25 mg by mouth at bedtime.    [provider]  Cholecalciferol (VITAMIN D3 GUMMIES ADULT PO) Take 1 tablet by mouth daily.    [provider]  clopidogrel (PLAVIX) 75 MG tablet Take 1 tablet (75 mg total) by mouth daily with breakfast. 01/01/14   Barton Dubois, MD  HYDROcodone-acetaminophen (NORCO) 7.5-325 MG  tablet TK 1 T PO  Q 6 TO 8 H PRN PAIN 08/27/17   [provider]  metFORMIN (GLUCOPHAGE) 1000 MG tablet Take 1,000 mg by mouth 2 (two) times daily with a meal.    [provider]  pantoprazole (PROTONIX) 20 MG tablet Take 1 tablet (20 mg total) by mouth daily. 07/13/14   Billy Fischer, MD  polyethylene glycol (MIRALAX / [name]) packet Take 17 g by mouth daily as needed (for constipation.).     [provider]  SANTYL ointment Apply 1 application topically daily. Applied to wound of leg and dress wound 06/14/17   [provider]  traMADol (ULTRAM) 50 MG tablet Take 1 tablet (50 mg total) by mouth every 6 (six) hours as needed for moderate pain. 09/10/17   Algernon Huxley, MD  valsartan-hydrochlorothiazide (DIOVAN-HCT) 320-25 MG tablet Take 1 tablet by mouth daily. 08/05/17   [provider]    Family History History reviewed. No pertinent family history.  Social History Social History   Tobacco Use  . Smoking status: Never Smoker  . Smokeless tobacco: Never Used  Substance Use Topics  . Alcohol use: No  . Drug use: No     Allergies   Patient has no known allergies.   Review of Systems Review of Systems  Constitutional: Negative for fever.  Gastrointestinal: Negative for abdominal pain and vomiting.  Skin: Positive for wound.     Physical Exam Updated Vital Signs BP (!) 145/78 (BP Location: Right Arm)   Pulse 95   Temp 97.8 F (36.6 C)   Resp 18   SpO2 100%   Physical Exam  Constitutional: She is oriented to person, place, and time. She appears well-developed and well-nourished.  HENT:  Head: Normocephalic and atraumatic.  Mouth/Throat: Oropharynx is clear and moist and mucous membranes are normal.  Eyes: Conjunctivae, EOM and lids are normal. Pupils are equal, round, and reactive to light.  Neck: Normal range of motion and full passive range of motion without pain.  Cardiovascular: Normal rate, regular rhythm and normal  pulses.  Pulmonary/Chest: Effort normal and breath sounds normal.  Abdominal: Soft. Normal appearance. There is no tenderness. There is no rigidity and no guarding.  Musculoskeletal: Normal range of motion.  1+ pitting edema noted to the dorsal aspect of the left foot.  Patient reports that this is not new.  No overlying warmth, erythema.  Plantar flexion dorsiflexion of left ankle intact without difficulty.  Neurological: She is alert and oriented to person, place, and time.  Skin: Skin is warm and dry. Capillary refill takes less than 2 seconds.  Small skin tear to the plantar surface of the left first toe.  Bleeding is controlled.  Good range of motion of all 5 digits of left foot.  No other open wounds, abrasions noted to the  left foot.  Patient does have a chronic appearing ulcer noted to the anterior aspect of the left shin.  There is some granulation tissue noted but no surrounding warmth, erythema.  Psychiatric: She has a normal mood and affect. Her speech is normal.  Nursing note and vitals reviewed.    ED Treatments / Results  Labs (all labs ordered are listed, but only abnormal results are displayed) Labs Reviewed - No data to display  EKG  EKG Interpretation None       Radiology No results found.  Procedures Procedures (including critical care time)  Medications Ordered in ED Medications - No data to display   Initial Impression / Assessment and Plan / ED Course  I have reviewed the triage vital signs and the nursing notes.  Pertinent labs & imaging results that were available during my care of the patient were reviewed by me and considered in my medical decision making (see chart for details).     83 year old female who presents with skin tear noted to the left first toe that began this afternoon.  Patient reports that she was picking some dry skin which caused a slight skin tear.  Patient reports that the site continued to bleed causing concern.  Patient is  currently on Plavix.  Patient denies any other trauma, injury.  Patient does have a chronic appearing ulcer noted to the anterior shin.  She is followed by wound clinic in the next appointment is in 2 days.  Wound care provided in the department.  Bleeding is controlled.  Bandage was placed on the area.  Instructed patient to follow-up with her wound care doctor as previously scheduled.  Advised wound care instructions with patient and family. Patient had ample opportunity for questions and discussion. All patient's questions were answered with full understanding. Strict return precautions discussed. Patient expresses understanding and agreement to plan.    Final Clinical Impressions(s) / ED Diagnoses   Final diagnoses:  Tear of skin of plantar aspect of left foot, initial encounter    ED Discharge Orders    None       Desma Mcgregor 10/24/17 1851    Isla Pence, MD 10/24/17 2025

## 2017-10-24 NOTE — ED Triage Notes (Signed)
Pt complaining of bleeding from her L big toe that started today. She denies injury. Bleeding controlled in triage, but pt is on plavix. She has a hx of diabetes and PVD. Denies pain, but endorses L foot numbness. A&Ox4. Ambulatory with assistance. She is also complaining of bilateral ear "stuffiness."

## 2017-10-24 NOTE — Discharge Instructions (Signed)
Keep the wound clean and dry.  Follow-up with the wound clinic regarding her chronic ulcer on your leg.  Return to emergency department for any worsening bleeding, redness or swelling of the toe, drainage from the area or any other worsening or concerning symptoms.

## 2017-10-26 ENCOUNTER — Ambulatory Visit (INDEPENDENT_AMBULATORY_CARE_PROVIDER_SITE_OTHER): Payer: Medicare Other | Admitting: Vascular Surgery

## 2017-10-26 ENCOUNTER — Encounter (INDEPENDENT_AMBULATORY_CARE_PROVIDER_SITE_OTHER): Payer: Self-pay | Admitting: Vascular Surgery

## 2017-10-26 VITALS — BP 148/71 | HR 95 | Resp 16 | Wt 121.0 lb

## 2017-10-26 DIAGNOSIS — I7025 Atherosclerosis of native arteries of other extremities with ulceration: Secondary | ICD-10-CM

## 2017-10-26 DIAGNOSIS — I10 Essential (primary) hypertension: Secondary | ICD-10-CM

## 2017-10-26 DIAGNOSIS — E119 Type 2 diabetes mellitus without complications: Secondary | ICD-10-CM

## 2017-10-26 NOTE — Assessment & Plan Note (Signed)
I have independently reviewed her CT angiogram and this demonstrates a widely patent left femoral endarterectomy including the origins of the official femoral artery.  There is an interpretation of a possible slight dissection at the origins of both the SFA and profunda femoris artery.  I am not entirely sure what they are seeing, but clearly there is no flow limitation this is not problem she does have more distal disease in the popliteal, distal SFA and tibial vessels.  Her peroneal artery is her dominant runoff distally.  At this point, her perfusion is much improved and she should have adequate perfusion for healing in the mid calf level.  I have discussed an angiogram to treat her remaining disease, but worsens, with her advanced age we are going to try to hold off on that.  I am going to plan to see her back in about 2-3 months with noninvasive studies.  Continue local wound care at the wound care center.

## 2017-10-26 NOTE — Assessment & Plan Note (Signed)
blood pressure control important in reducing the progression of atherosclerotic disease. On appropriate oral medications.  

## 2017-10-26 NOTE — Patient Instructions (Signed)

## 2017-10-26 NOTE — Progress Notes (Signed)
MRN : [id number]  [name] is a 83 y.o. (Feb 27, 1926) female who presents with chief complaint of  Chief Complaint  Patient presents with  . Follow-up    ct results  .  History of Present Illness: Patient returns today in follow up of left lower extremity ulceration.  I have independently reviewed her CT angiogram and this demonstrates a widely patent left femoral endarterectomy including the origins of the official femoral artery.  There is an interpretation of a possible slight dissection at the origins of both the SFA and profunda femoris artery.  I am not entirely sure what they are seeing, but clearly there is no flow limitation this is not problem she does have more distal disease in the popliteal, distal SFA and tibial vessels.  Her peroneal artery is her dominant runoff distally.  Her wound is healing slowly under the good care of the wound care center.  Current Outpatient Medications  Medication Sig Dispense Refill  . amLODipine (NORVASC) 10 MG tablet Take 10 mg by mouth daily.    Marland Kitchen aspirin EC 81 MG tablet Take 81 mg by mouth daily.    Marland Kitchen atorvastatin (LIPITOR) 10 MG tablet Take 10 mg by mouth daily.    . Calcium-Magnesium-Zinc (CAL-MAG-ZINC PO) Take 1 tablet by mouth daily.    . carvedilol (COREG) 6.25 MG tablet Take 6.25 mg by mouth at bedtime.    . Cholecalciferol (VITAMIN D3 GUMMIES ADULT PO) Take 1 tablet by mouth daily.    . clopidogrel (PLAVIX) 75 MG tablet Take 1 tablet (75 mg total) by mouth daily with breakfast. 30 tablet 1  . HYDROcodone-acetaminophen (NORCO) 7.5-325 MG tablet TK 1 T PO  Q 6 TO 8 H PRN PAIN  0  . metFORMIN (GLUCOPHAGE) 1000 MG tablet Take 1,000 mg by mouth 2 (two) times daily with a meal.    . pantoprazole (PROTONIX) 20 MG tablet Take 1 tablet (20 mg total) by mouth daily. 30 tablet 1  . polyethylene glycol (MIRALAX / GLYCOLAX) packet Take 17 g by mouth daily as needed (for constipation.).     Marland Kitchen SANTYL ointment Apply 1 application topically  daily. Applied to wound of leg and dress wound  0  . traMADol (ULTRAM) 50 MG tablet Take 1 tablet (50 mg total) by mouth every 6 (six) hours as needed for moderate pain. 30 tablet 1  . valsartan-hydrochlorothiazide (DIOVAN-HCT) 320-25 MG tablet Take 1 tablet by mouth daily.     No current facility-administered medications for this visit.     Past Medical History:  Diagnosis Date  . Anemia    family unsure of this history  . Arthritis   . Chronic ulcer of calf (HCC)    Left  . Constipation   . Diabetes mellitus without complication ([place])   . Dyslipidemia   . GERD (gastroesophageal reflux disease)   . Hypertension   . Peripheral vascular disease [facility])     Past Surgical History:  Procedure Laterality Date  . ABDOMINAL HYSTERECTOMY    . ENDARTERECTOMY FEMORAL Left 09/08/2017   Procedure: ENDARTERECTOMY FEMORAL WITH CORMATRIX PATCH;  Surgeon: Algernon Huxley, MD;  Location: [facility] ORS;  Service: Vascular;  Laterality: Left;  . EYE SURGERY Bilateral    Cataract Extraction with IOL  . LOWER EXTREMITY ANGIOGRAPHY Left 07/22/2017   Procedure: Lower Extremity Angiography;  Surgeon: Algernon Huxley, MD;  Location: [place] [facility];  Service: Cardiovascular;  Laterality: Left;  . LOWER EXTREMITY ANGIOGRAPHY Left 08/16/2017  Procedure: Lower Extremity Angiography;  Surgeon: Algernon Huxley, MD;  Location: [place] [facility];  Service: Cardiovascular;  Laterality: Left;  Marland Kitchen MULTIPLE TOOTH EXTRACTIONS      Social History Social History   Tobacco Use  . Smoking status: Never Smoker  . Smokeless tobacco: Never Used  Substance Use Topics  . Alcohol use: No  . Drug use: No     Family History No bleeding or clotting disorders, no aneurysms, no autoimmune diseases  No Known Allergies   REVIEW OF SYSTEMS (Negative unless checked)  Constitutional: [] Weight loss  [] Fever  [] Chills Cardiac: [] Chest pain   [] Chest pressure   [] Palpitations   [] Shortness of breath when laying flat    [] Shortness of breath at rest   [] Shortness of breath with exertion. Vascular:  [] Pain in legs with walking   [] Pain in legs at rest   [] Pain in legs when laying flat   [] Claudication   [] Pain in feet when walking  [] Pain in feet at rest  [] Pain in feet when laying flat   [] History of DVT   [] Phlebitis   [x] Swelling in legs   [] Varicose veins   [] Non-healing ulcers Pulmonary:   [] Uses home oxygen   [] Productive cough   [] Hemoptysis   [] Wheeze  [] COPD   [] Asthma Neurologic:  [] Dizziness  [] Blackouts   [] Seizures   [] History of stroke   [] History of TIA  [] Aphasia   [] Temporary blindness   [] Dysphagia   [] Weakness or numbness in arms   [] Weakness or numbness in legs Musculoskeletal:  [] Arthritis   [] Joint swelling   [] Joint pain   [] Low back pain Hematologic:  [] Easy bruising  [] Easy bleeding   [] Hypercoagulable state   [] Anemic   Gastrointestinal:  [] Blood in stool   [] Vomiting blood  [] Gastroesophageal reflux/heartburn   [] Abdominal pain Genitourinary:  [] Chronic kidney disease   [] Difficult urination  [] Frequent urination  [] Burning with urination   [] Hematuria Skin:  [] Rashes   [x] Ulcers   [x] Wounds Psychological:  [] History of anxiety   []  History of major depression.  Physical Examination  BP (!) 148/71 (BP Location: Right Arm)   Pulse 95   Resp 16   Wt 121 lb (54.9 kg)   BMI 19.83 kg/m  Gen:  WD/WN, NAD.  Appears younger than stated age Head: [place]/AT, No temporalis wasting. Ear/Nose/Throat: Hearing grossly intact, nares w/o erythema or drainage, trachea midline Eyes: Conjunctiva clear. Sclera non-icteric Neck: Supple.  No JVD.  Pulmonary:  Good air movement, no use of accessory muscles.  Cardiac: Irregular Vascular:  Vessel Right Left  Radial Palpable Palpable                      Popliteal  not palpable  not palpable  PT  1+ palpable  not palpable  DP  not palpable  trace palpable    Musculoskeletal: M/S 5/5 throughout.  No deformity or atrophy.  1-2+ left lower extremity  edema. Neurologic: Sensation grossly intact in extremities.  Symmetrical.  Speech is fluent.  Psychiatric: Judgment intact, Mood & affect appropriate for pt's clinical situation. Dermatologic: Large oval wound on the anterior shin area with mild to moderate fibrinous exudate and fair granulation tissue.  No surrounding erythema.      Labs Recent Results (from the past 2160 hour(s))  BUN     Status: Abnormal   Collection Time: 08/13/17 11:08 AM  Result Value Ref Range   BUN 22 (H) 6 - 20 mg/dL  Creatinine, serum  Status: Abnormal   Collection Time: 08/13/17 11:08 AM  Result Value Ref Range   Creatinine, Ser 1.29 (H) 0.44 - 1.00 mg/dL   GFR calc non Af Amer 36 (L) >60 mL/min   GFR calc Af Amer 41 (L) >60 mL/min    Comment: (NOTE) The eGFR has been calculated using the CKD EPI equation. This calculation has not been validated in all clinical situations. eGFR's persistently <60 mL/min signify possible Chronic Kidney Disease.   Glucose, capillary     Status: Abnormal   Collection Time: 08/16/17  2:27 PM  Result Value Ref Range   Glucose-Capillary 143 (H) 65 - 99 mg/dL  APTT     Status: None   Collection Time: 09/02/17  1:06 PM  Result Value Ref Range   aPTT 29 24 - 36 seconds  Basic metabolic panel     Status: Abnormal   Collection Time: 09/02/17  1:06 PM  Result Value Ref Range   Sodium 141 135 - 145 mmol/L   Potassium 3.9 3.5 - 5.1 mmol/L   Chloride 104 101 - 111 mmol/L   CO2 25 22 - 32 mmol/L   Glucose, Bld 96 65 - 99 mg/dL   BUN 19 6 - 20 mg/dL   Creatinine, Ser 1.01 (H) 0.44 - 1.00 mg/dL   Calcium 9.6 8.9 - 10.3 mg/dL   GFR calc non Af Amer 48 (L) >60 mL/min   GFR calc Af Amer 55 (L) >60 mL/min    Comment: (NOTE) The eGFR has been calculated using the CKD EPI equation. This calculation has not been validated in all clinical situations. eGFR's persistently <60 mL/min signify possible Chronic Kidney Disease.    Anion gap 12 5 - 15  CBC WITH DIFFERENTIAL      Status: Abnormal   Collection Time: 09/02/17  1:06 PM  Result Value Ref Range   WBC 6.0 3.6 - 11.0 K/uL   RBC 3.94 3.80 - 5.20 MIL/uL   Hemoglobin 10.5 (L) 12.0 - 16.0 g/dL   HCT 31.2 (L) 35.0 - 47.0 %   MCV 79.3 (L) 80.0 - 100.0 fL   MCH 26.6 26.0 - 34.0 pg   MCHC 33.6 32.0 - 36.0 g/dL   RDW 17.0 (H) 11.5 - 14.5 %   Platelets 428 150 - 440 K/uL   Neutrophils Relative % 61 %   Neutro Abs 3.7 1.4 - 6.5 K/uL   Lymphocytes Relative 27 %   Lymphs Abs 1.6 1.0 - 3.6 K/uL   Monocytes Relative 10 %   Monocytes Absolute 0.6 0.2 - 0.9 K/uL   Eosinophils Relative 2 %   Eosinophils Absolute 0.1 0 - 0.7 K/uL   Basophils Relative 0 %   Basophils Absolute 0.0 0 - 0.1 K/uL  Protime-INR     Status: None   Collection Time: 09/02/17  1:06 PM  Result Value Ref Range   Prothrombin Time 13.6 11.4 - 15.2 seconds   INR 1.05   Type and screen     Status: None   Collection Time: 09/02/17  1:06 PM  Result Value Ref Range   ABO/RH(D) B POS    Antibody Screen NEG    Sample Expiration 09/16/2017    Extend sample reason NO TRANSFUSIONS OR PREGNANCY IN THE PAST 3 MONTHS   Surgical pcr screen     Status: None   Collection Time: 09/02/17  1:06 PM  Result Value Ref Range   MRSA, PCR NEGATIVE NEGATIVE   Staphylococcus aureus NEGATIVE NEGATIVE    Comment: (  NOTE) The Xpert SA Assay (FDA approved for NASAL specimens in patients 30 years of age and older), is one component of a comprehensive surveillance program. It is not intended to diagnose infection nor to guide or monitor treatment.   Glucose, capillary     Status: Abnormal   Collection Time: 09/08/17  6:15 AM  Result Value Ref Range   Glucose-Capillary 119 (H) 65 - 99 mg/dL  ABO/Rh     Status: None   Collection Time: 09/08/17  6:49 AM  Result Value Ref Range   ABO/RH(D) B POS   Surgical pathology     Status: None   Collection Time: 09/08/17  8:41 AM  Result Value Ref Range   SURGICAL PATHOLOGY      Surgical Pathology CASE:  [id number] PATIENT: [facility] Surgical Pathology Report     SPECIMEN SUBMITTED: A. Plaque, left common, superficial and profunda femoral  CLINICAL HISTORY: None provided  PRE-OPERATIVE DIAGNOSIS: ASO w/ claudication  POST-OPERATIVE DIAGNOSIS: Same as pre-op     DIAGNOSIS: A. ARTERIES, LEFT COMMON, SUPERFICIAL, AND PROFUNDA FEMORAL; ENDARTERECTOMY: - ATHEROSCLEROTIC PLAQUES, CALCIFIED AND OSSIFIED.   GROSS DESCRIPTION: A. Labeled: left common, superficial and profunda femoral plaque Tissue fragment(s): multiple Size: aggregate, 4.2 x 2.5 x 0.6 cm Description: yellow-pink focally calcified fragments  Representative following decalcification submitted in one cassette(s).  Final Diagnosis performed by Bryan Lemma, MD.  Electronically signed 09/10/2017 8:16:47PM    The electronic signature indicates that the named Attending Pathologist has evaluated the specimen  Technical component performed at [facility], [address] [place], [place] [address] Lab: [phone] Dir: Darrick Penna. Evette Doffing, MD  Professional component performed at [facility], [facility], [place], [place], [place] [address] Lab: [phone] Dir: Dellia Nims. Rubinas, MD    Glucose, capillary     Status: Abnormal   Collection Time: 09/08/17 10:16 AM  Result Value Ref Range   Glucose-Capillary 156 (H) 65 - 99 mg/dL  Glucose, capillary     Status: Abnormal   Collection Time: 09/08/17 11:57 AM  Result Value Ref Range   Glucose-Capillary 141 (H) 65 - 99 mg/dL  CBC     Status: Abnormal   Collection Time: 09/09/17 12:08 AM  Result Value Ref Range   WBC 9.2 3.6 - 11.0 K/uL   RBC 3.38 (L) 3.80 - 5.20 MIL/uL   Hemoglobin 8.7 (L) 12.0 - 16.0 g/dL   HCT 26.3 (L) 35.0 - 47.0 %   MCV 77.7 (L) 80.0 - 100.0 fL   MCH 25.8 (L) 26.0 - 34.0 pg   MCHC 33.2 32.0 - 36.0 g/dL   RDW 17.0 (H) 11.5 - 14.5 %   Platelets 374 150 - 440 K/uL  Basic metabolic panel     Status: Abnormal    Collection Time: 09/09/17 12:08 AM  Result Value Ref Range   Sodium 137 135 - 145 mmol/L   Potassium 3.5 3.5 - 5.1 mmol/L   Chloride 100 (L) 101 - 111 mmol/L   CO2 25 22 - 32 mmol/L   Glucose, Bld 133 (H) 65 - 99 mg/dL   BUN 10 6 - 20 mg/dL   Creatinine, Ser 0.78 0.44 - 1.00 mg/dL   Calcium 8.7 (L) 8.9 - 10.3 mg/dL   GFR calc non Af Amer >60 >60 mL/min   GFR calc Af Amer >60 >60 mL/min    Comment: (NOTE) The eGFR has been calculated using the CKD EPI equation. This calculation has not been validated in all clinical situations. eGFR's persistently <60  mL/min signify possible Chronic Kidney Disease.    Anion gap 12 5 - 15    Radiology Dg Tibia/fibula Left  Result Date: 10/07/2017 CLINICAL DATA:  83 year old female with nonhealing wound in the left lower extremity. EXAM: LEFT TIBIA AND FIBULA - 2 VIEW COMPARISON:  None. FINDINGS: There is no evidence of acute fracture or evidence of acute osteomyelitis. Vascular calcifications are present. No focal bony lesions are identified. IMPRESSION: No acute bony abnormality. Electronically Signed   By: Margarette Canada M.D.   On: 10/07/2017 17:11   Ct Angio Ao+bifem W & Or Wo Contrast  Result Date: 10/19/2017 CLINICAL DATA:  83 year old female with a left shin ulceration. Patient reportedly underwent left common, profunda and superficial femoral artery endarterectomy is with patchy angioplasty on 09/06/2017. EXAM: CT ANGIOGRAPHY OF ABDOMINAL AORTA WITH ILIOFEMORAL RUNOFF TECHNIQUE: Multidetector CT imaging of the abdomen, pelvis and lower extremities was performed using the standard protocol during bolus administration of intravenous contrast. Multiplanar CT image reconstructions and MIPs were obtained to evaluate the vascular anatomy. CONTRAST:  155m ISOVUE-370 IOPAMIDOL (ISOVUE-370) INJECTION 76% COMPARISON:  None. FINDINGS: VASCULAR Aorta: Heterogeneous atherosclerotic plaque throughout the abdominal aorta. No evidence of dissection or aneurysm.  Celiac: Patent without evidence of aneurysm, dissection, vasculitis or significant stenosis. SMA: Patent without evidence of aneurysm, dissection, vasculitis or significant stenosis. Renals: Solitary right renal artery. Atherosclerotic plaque at the origin without significant stenosis. There are 2 left-sided renal arteries. No significant stenosis. No changes of fibromuscular dysplasia. IMA: Patent without evidence of aneurysm, dissection, vasculitis or significant stenosis. RIGHT Lower Extremity Inflow: Mixed calcified and fibrofatty plaque results in a mild to moderate (approximately 40%) stenosis of the common iliac artery. Heavily calcified atherosclerotic plaque at the origin of the internal iliac artery results in significant focal stenosis. Scattered calcified plaque along the course of the external iliac artery without focal stenosis. Outflow: Calcified plaque along the common femoral artery without significant stenosis. Plaque extends into the origin of the superficial femoral artery resulting in a moderate to advanced focal stenosis. There is a second tandem high-grade stenosis in the proximal superficial femoral artery. The mid aspect the superficial femoral artery is relatively spared from disease. There is occlusion of the artery beginning in the mid thigh. The artery reconstitutes in the region of the adductor canal. Bulky calcified plaque in the popliteal artery results in moderate stenosis. Runoff: High-grade focal stenosis at the origin of the anterior tibial artery. The tibioperoneal trunk is diseased. All 3 runoff arteries demonstrates scattered intermittent calcifications but appear patent into the ankle. LEFT Lower Extremity Inflow: Heavily calcified plaque throughout the common iliac artery without definitive high-grade stenosis. Stents in the calcified common and external iliac artery are present and remain patent. The internal iliac artery is patent. Scattered atherosclerotic plaques  throughout the external iliac artery without focal stenosis. Outflow: Surgical changes of left groin cutdown with common femoral endarterectomy and patch angioplasty. Common femoral artery is widely patent. The bifurcation into the profunda and superficial femoral arteries are also widely patent. Very subtle linear filling defects noted in the proximal profunda and superficial femoral arteries. The superficial femoral artery defect can be seen in all 3 planes. Findings are consistent with non flow limiting focal dissection. Bulky calcified plaque present in the superficial femoral artery in the midthigh results in a focal and likely high-grade stenosis. The remainder of the superficial femoral artery is diffusely diseased. There is a second focal high-grade stenosis in the region of the adductor canal. Bulky calcified plaque  throughout the profunda femoral artery likely resulting in a focal significant stenosis at the junction of the P1 and P2 segments. Fibrofatty plaque results in high-grade stenosis of the popliteal artery just above the knee joint. Runoff: High-grade focal stenosis at the origin of the anterior tibial artery. The 3 runoff vessels demonstrates scattered atherosclerotic calcifications. The anterior and posterior tibial arteries occlude above the ankle. The peroneal artery remains patent to the ankle and provides single-vessel runoff. Veins: No focal venous abnormality. Review of the MIP images confirms the above findings. NON-VASCULAR Lower chest: Solitary 3- 4 mm nodule in the periphery of the right middle lobe (image 1, series 6). Otherwise, the visualized lower lungs are clear. The heart is normal in size. Calcifications are present along the coronary arteries. Unremarkable visualized thoracic esophagus. Hepatobiliary: Normal hepatic contour and morphology. No discrete hepatic lesion. Numerous small stones layer within the gallbladder lumen. No biliary ductal dilatation. Pancreas: Unremarkable.  No pancreatic ductal dilatation or surrounding inflammatory changes. Spleen: Normal in size without focal abnormality. Adrenals/Urinary Tract: Adrenal glands are unremarkable. Kidneys are normal, without renal calculi, focal lesion, or hydronephrosis. Bladder is unremarkable. Stomach/Bowel: No focal bowel wall thickening or evidence of obstruction. Lymphatic: No suspicious lymphadenopathy. Reproductive: Status post hysterectomy. No adnexal masses. Other: No abdominal wall hernia or abnormality. No abdominopelvic ascites. Musculoskeletal: No acute fracture or aggressive appearing lytic or blastic osseous lesion. IMPRESSION: VASCULAR 1. Surgical changes of recent left groin cutdown, endarterectomy and patch angioplasty. Subtle, focal but non flow limiting dissection flaps present in the proximal superficial and profunda femoral arteries. These very subtle findings are unlikely to be of hemodynamic significance. 2. Multifocal left lower extremity peripheral arterial disease in the outflow and runoff vascular distributions. There are multiple moderate to high-grade stenoses involving the superficial femoral artery in the mid and distal thigh, as well as throughout the P1 and P2 segments of the popliteal artery. Both the anterior and posterior tibial arteries occlude above the ankle. The peroneal artery provides single-vessel runoff to the ankle. 3. Patent left common and external iliac artery stents. 4. Multifocal right lower extremity peripheral arterial disease primarily within the outflow distribution. Multifocal disease throughout the superficial femoral artery including a moderately long segment occlusion extending from the mid thigh through the adductor canal. Patent 3 vessel runoff to the ankle. 5. Coronary artery calcifications. 6.  Aortic Atherosclerosis (ICD10-170.0) NON-VASCULAR 1. No acute abnormality. 2. Solitary tiny 3-4 mm right middle lobe pulmonary nodule noted incidentally. No follow-up needed if  patient is low-risk. Non-contrast chest CT can be considered in 12 months if patient is high-risk. This recommendation follows the consensus statement: Guidelines for Management of Incidental Pulmonary Nodules Detected on CT Images: From the Fleischner Society 2017; Radiology 2017; 284:228-243. 3. Cholelithiasis. Signed, Criselda Peaches, MD [facility] Radiology Electronically Signed   By: Jacqulynn Cadet M.D.   On: 10/19/2017 16:06     Assessment/Plan  Diabetes mellitus without complication blood glucose control important in reducing the progression of atherosclerotic disease. Also, involved in wound healing. On appropriate medications.   Hypertension blood pressure control important in reducing the progression of atherosclerotic disease. On appropriate oral medications.   Atherosclerosis of native arteries of the extremities with ulceration ([place])  I have independently reviewed her CT angiogram and this demonstrates a widely patent left femoral endarterectomy including the origins of the official femoral artery.  There is an interpretation of a possible slight dissection at the origins of both the SFA and profunda  femoris artery.  I am not entirely sure what they are seeing, but clearly there is no flow limitation this is not problem she does have more distal disease in the popliteal, distal SFA and tibial vessels.  Her peroneal artery is her dominant runoff distally.  At this point, her perfusion is much improved and she should have adequate perfusion for healing in the mid calf level.  I have discussed an angiogram to treat her remaining disease, but worsens, with her advanced age we are going to try to hold off on that.  I am going to plan to see her back in about 2-3 months with noninvasive studies.  Continue local wound care at the wound care center.    Leotis Pain, MD  10/26/2017 4:00 PM    This note was created with Dragon  medical transcription system.  Any errors from dictation are purely unintentional

## 2017-10-26 NOTE — Assessment & Plan Note (Signed)
blood glucose control important in reducing the progression of atherosclerotic disease. Also, involved in wound healing. On appropriate medications.  

## 2017-10-28 ENCOUNTER — Encounter: Payer: Medicare Other | Admitting: Surgery

## 2017-10-28 DIAGNOSIS — E11622 Type 2 diabetes mellitus with other skin ulcer: Secondary | ICD-10-CM | POA: Diagnosis not present

## 2017-10-29 NOTE — Progress Notes (Addendum)
MICA, RAMDASS ([id number]) Visit Report for 10/28/2017 Chief Complaint Document Details Patient Name: [name], [name] Date of Service: 10/28/2017 3:15 PM Medical Record Number: [id number] Patient Account Number: [id number] Date of Birth/Sex: [birth year] (83 y.o. Female) Treating RN: Carolyne Fiscal, Debi Primary Care Provider: Lucianne Lei Other Clinician: Referring Provider: Lucianne Lei Treating Provider/Extender: Frann Rider in Treatment: 21 Information Obtained from: Patient Chief Complaint Patients presents for treatment of an open diabetic ulcer 2 the left lower anterior leg which she sustained an injury by blunt trauma Electronic Signature(s) Signed: 10/28/2017 4:09:38 PM By: Christin Fudge MD, FACS Entered By: Christin Fudge on 10/28/2017 16:09:38 Lucente, QDIYMEBR P. ([id number]) -------------------------------------------------------------------------------- Debridement Details Patient Name: [name] P. Date of Service: 10/28/2017 3:15 PM Medical Record Number: [id number] Patient Account Number: [id number] Date of Birth/Sex: Nov 15, 1926 (83 y.o. Female) Treating RN: Carolyne Fiscal, Debi Primary Care Provider: Lucianne Lei Other Clinician: Referring Provider: Lucianne Lei Treating Provider/Extender: Frann Rider in Treatment: 21 Debridement Performed for Wound #1 Left,Anterior Lower Leg Assessment: Performed By: Physician Christin Fudge, MD Debridement: Debridement Severity of Tissue Pre Bone involvement without necrosis Debridement: Pre-procedure Verification/Time Yes - 15:33 Out Taken: Start Time: 15:33 Pain Control: Lidocaine 4% Topical Solution Level: Skin/Subcutaneous Tissue Total Area Debrided (L x W): 3 (cm) x 2 (cm) = 6 (cm) Tissue and other material Viable, Non-Viable, Eschar, Fibrin/Slough, Subcutaneous debrided: Instrument: Forceps, Scissors Bleeding: Minimum Hemostasis Achieved: Pressure End Time: 15:36 Procedural Pain: 0 Post  Procedural Pain: 0 Response to Treatment: Procedure was tolerated well Post Debridement Measurements of Total Wound Length: (cm) 8 Width: (cm) 3.7 Depth: (cm) 0.3 Volume: (cm) 6.974 Character of Wound/Ulcer Post Debridement: Improved Severity of Tissue Post Debridement: Bone involvement without necrosis Post Procedure Diagnosis Same as Pre-procedure Electronic Signature(s) Signed: 10/28/2017 4:09:32 PM By: Christin Fudge MD, FACS Signed: 10/29/2017 4:39:39 PM By: Alric Quan Entered By: Christin Fudge on 10/28/2017 16:09:32 Brazel, Katelind P. ([id number]) -------------------------------------------------------------------------------- HPI Details Patient Name: [name] P. Date of Service: 10/28/2017 3:15 PM Medical Record Number: [id number] Patient Account Number: [id number] Date of Birth/Sex: 06/16/1926 (83 y.o. Female) Treating RN: Carolyne Fiscal, Debi Primary Care Provider: Lucianne Lei Other Clinician: Referring Provider: Lucianne Lei Treating Provider/Extender: Frann Rider in Treatment: 21 History of Present Illness Location: left anterior shin Quality: Patient reports experiencing a dull pain to affected area(s). Severity: Patient states wound are getting worse. Duration: Patient has had the wound for < 3 weeks prior to presenting for treatment Timing: Pain in wound is Intermittent (comes and goes Context: The wound occurred when the patient blunt trauma against her left shin Modifying Factors: Other treatment(s) tried include:active Bactroban ointment locally Associated Signs and Symptoms: Patient reports having: occasional discharge from the wound HPI Description: 82 year old patient was seen in the ED about 10 days ago for a history of abrasion to the left lower extremity while she was boarding a bus and scraped the anterior part of her left leg. She has been a diabetic for many years and has been taking treatment regularly. Her past medical history is  also significant for anemia arthritis constipation and hypertension and is status post abdominal hysterectomy. She has never been a smoker. after her ER visit she was advised to apply Bactroban ointment to the wound twice a day and continue to monitor her blood glucose levels. her last hemoglobin A1c was done 3 years ago and was 6.6% 06/04/17 patient left anterior shin wound appears to be doing well although it is still somewhat dry despite the treatment with Medihoney. We  have been using Kerlex following the Medihoney application and I believe this is just not retaining that much moisture. Nonetheless there is no evidence of infection. 06/11/17 on evaluation today patient appears to be doing a little bit worse in regard to her wound. The entirety of the wound is dry and unfortunately the Medihoney does not seem to be helping this. That is even with the dressing changes that I made last week to try to retain more moisture. She has not tried [employer] as of yet. She was also noncompressible when testing for ABIs. 07/01/2017 -- she had a arterial studies done and was seen by Dr. Lucky Cowboy. her noninvasive studies showed noncompressible vessels on the right but brisk waveforms and digital pressures of 71 on the right consistent with only mild arterial insufficienc. On the left her ABI was 0.47 and this may be falsely elevated due to calcification. No digital pressures were obtained on the left and this is consistent with severe arterial insufficiency. this critical limb threatening situation on the left, would make wound healing difficult and it represents a serious situation and he has recommended an angiography with possible revascularization. The patient desired to defer the decision to she discuss with the family members. 07/09/17 on evaluation today patient appears to be doing fairly well in regard to her left anterior lower extremity wound. She has been tolerating the dressing changes without complication  we have been utilizing Santyl at this time. She tells me that she is having no significant pain compared to what she has had in the past. She has decided after discussing with family that she is going to go forward with the surgery with Dr. dew. This is to restore better blood flow to left lower extremity. With that it does appear that her lower extremity wound is making some progress albeit slow. No fevers, chills, nausea, or vomiting noted at this time. 07/16/17 on evaluation today patient's wound appears to be doing roughly about the same although some of the slough is clearing off. [name] she has her appointment with vascular next week on Thursday the 23rd. With that being said her wound does not appear to be hurting as badly as it has in the past which is good news. No fevers, chills, nausea, or vomiting noted at this time. 07/30/2017 -- she had surgery on 07/22/2017 --indications being nonhealing ulcer on the left leg with a noninvasive study showing marked reduction in ABI of less than 0.5 and no digital pressure on her left leg. she had a percutaneous transluminal angioplasty of the left common and external iliac arteries. She then had a stent placed to the left external iliac arteries and to the left common iliac artery. the left lower extremity showed occlusion of the common femoral artery and the origins of the Warriner, Tiena P. ([id number]) SFA and the profunda femoris artery. The flow distally was poor and there were multiple areas of high-grade stenosis or occlusion of the distal SFA and the popliteal artery with only a diseased peroneal artery as runoff distally. She has a postop appointment on September 23 08/20/2017 -- she went back to the vascular office for an appointment on 08/12/2017 and bilateral ABIs were done which were notable for moderate right lower extremity arterial disease and unable to obtain left ankle brachial indices due to absent Doppler velocities in the  posterior tibial artery and the anterior tibial artery. Monophasic waveforms to the left femoral and popliteal artery. this was worrisome for a failure of stents placed and  lower extremity angiogram was recommended. as was done on 08/16/2017 and the stents placed in the left eye Lex system had some mild narrowing at the iliac bifurcation and the leading edge of the stent proximally was poor wall apposition with stenosis in the 50-60% range in the right common iliac artery. There was also occlusion of the common femoral artery and the origin of the SFA and profunda femoris artery. Flow distally was poor but they appeared to be multiple areas of high-grade stenosis or occlusion in the distal SFA and popliteal artery with only a diseased peroneal artery. in another stent was placed and deployed. The femoral occlusion would need to be treated surgically. 08/27/17 on evaluation today patient appears to be doing about the same in regard to her left for extremity wound. Unfortunately secondary to her vascular status this is not a very well healing wound at this time. She has been tolerating the dressing changes she has some discomfort but mainly with cleansing of the wound not at other times. No fevers, chills, nausea, or vomiting noted at this time. 09/17/2017 -- the patient was recently discharged from the hospital where she was admitted between 09/08/2017 and 09/10/2017, when she had a procedure on her left femoral artery with the endarterectomy and a patch. she will be going back for review this coming week. 09/24/2017 -- was seen on 09/18/2017 in the ER, for bleeding from her wound which was debrided earlier during the day. At the time she was seen there was no active bleeding. A thrombin pad was placed over the area and she was asked to change the outside dressing but come back to the wound center as planned. she was also seen at the [facility] office for a review after her  surgery and bilateral lower extremity ABI when compared to the previous ones showed minimal improvement of arterial blood flow. She was set up for a CT angiogram of the abdomen and pelvis and lower extremities to see the degree of peripheral arterial disease 10/28/2017 -- had a x-ray of the left tibia and fibula done on 10/07/2017 -- showed no bony acute abnormality she was reviewed by Dr. Corene Cornea dew on 10/26/2017, and he reviewed her CT angiogram and this demonstrated a widely patent left femoral endarterectomy including the origins of the femoral artery. She also had moderate distal disease in the popliteal artery, distal SFA and tibial disease. Her peroneal artery is a dominant runoff distally.he felt she had adequate perfusion for healing the mid calf level and he is putting off all surgical intervention for now and is going to see her back in 2-3 months for noninvasive studies. She also has a new wound on the plantar aspect of the left big toe where she pulled a piece of skin and has caused a superficial ulceration Electronic Signature(s) Signed: 10/28/2017 4:10:02 PM By: Christin Fudge MD, FACS Previous Signature: 10/28/2017 2:54:03 PM Version By: Christin Fudge MD, FACS Previous Signature: 10/28/2017 2:50:36 PM Version By: Christin Fudge MD, FACS Entered By: Christin Fudge on 10/28/2017 16:10:02 [employer], [name] ([id number]) -------------------------------------------------------------------------------- Physical Exam Details Patient Name: [name] P. Date of Service: 10/28/2017 3:15 PM Medical Record Number: [id number] Patient Account Number: [id number] Date of Birth/Sex: Dec 22, 1925 (83 y.o. Female) Treating RN: Carolyne Fiscal, Debi Primary Care Provider: Lucianne Lei Other Clinician: Referring Provider: Lucianne Lei Treating Provider/Extender: Frann Rider in Treatment: 21 Constitutional . Pulse regular. Respirations normal and unlabored. Afebrile. . Eyes Nonicteric.  Reactive to light. Ears, Nose, Mouth,  and Throat Lips, teeth, and gums WNL.Marland Kitchen Moist mucosa without lesions. Neck supple and nontender. No palpable supraclavicular or cervical adenopathy. Normal sized without goiter. Respiratory WNL. No retractions.. Cardiovascular Pedal Pulses WNL. No clubbing, cyanosis or edema. Lymphatic No adneopathy. No adenopathy. No adenopathy. Musculoskeletal Adexa without tenderness or enlargement.. Digits and nails w/o clubbing, cyanosis, infection, petechiae, ischemia, or inflammatory conditions.. Integumentary (Hair, Skin) No suspicious lesions. No crepitus or fluctuance. No peri-wound warmth or erythema. No masses.Marland Kitchen Psychiatric Judgement and insight Intact.. No evidence of depression, anxiety, or agitation.. Notes sharp debridement was done with a forcep and scissors and some of the necrotic debris was removed. The bone is very much palpable along the mid third of the tibial anterior edge. The new wound on the plantar aspect of the left toe is fairly superficial. Electronic Signature(s) Signed: 10/28/2017 4:10:58 PM By: Christin Fudge MD, FACS Entered By: Christin Fudge on 10/28/2017 16:10:58 [employer], [name] ([id number]) -------------------------------------------------------------------------------- Physician Orders Details Patient Name: [name] P. Date of Service: 10/28/2017 3:15 PM Medical Record Number: [id number] Patient Account Number: [id number] Date of Birth/Sex: [birth year] (83 y.o. Female) Treating RN: Montey Hora Primary Care Provider: Lucianne Lei Other Clinician: Referring Provider: Lucianne Lei Treating Provider/Extender: Frann Rider in Treatment: 32 Verbal / Phone Orders: No Diagnosis Coding Wound Cleansing Wound #1 Left,Anterior Lower Leg o Clean wound with Normal Saline. Wound #2 Left Toe Great o Clean wound with Normal Saline. Anesthetic Wound #1 Left,Anterior Lower Leg o Topical Lidocaine 4% cream  applied to wound bed prior to debridement Wound #2 Left Toe Great o Topical Lidocaine 4% cream applied to wound bed prior to debridement Primary Wound Dressing Wound #1 Left,Anterior Lower Leg o Santyl Ointment Wound #2 Left Toe Great o Foam Secondary Dressing o ABD pad o Other - telfa, ABD pad, kerlix wrap and secure with tape Wound #2 Left Toe Great o Other - coverlett Dressing Change Frequency Wound #1 Left,Anterior Lower Leg o Change dressing every day. Wound #2 Left Toe Great o Change dressing every day. Follow-up Appointments Wound #1 Left,Anterior Lower Leg o Return Appointment in: - 3 weeks Wound #2 Left Toe Great o Return Appointment in: - 3 weeks LENISE, JR ([id number]) Electronic Signature(s) Signed: 10/28/2017 4:36:05 PM By: Christin Fudge MD, FACS Signed: 10/29/2017 4:46:33 PM By: Montey Hora Entered By: Montey Hora on 10/28/2017 15:36:33 [employer], [name] ([id number]) -------------------------------------------------------------------------------- Problem List Details Patient Name: [name] P. Date of Service: 10/28/2017 3:15 PM Medical Record Number: [id number] Patient Account Number: [id number] Date of Birth/Sex: [birth year] (83 y.o. 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 ([id number]) -------------------------------------------------------------------------------- Progress Note Details Patient Name: [name] P. Date of Service: 10/28/2017 3:15 PM Medical Record Number: [id number] Patient Account Number: [id number] Date of Birth/Sex: 1926/07/21 (83 y.o. Female) Treating RN: Carolyne Fiscal, Debi Primary Care Provider: Lucianne Lei Other Clinician: Referring Provider: Lucianne Lei Treating Provider/Extender: Frann Rider in Treatment: 21 Subjective Chief Complaint Information obtained from Patient Patients presents for treatment of an  open diabetic ulcer 2 the left lower anterior leg which she sustained an injury by blunt trauma History of Present Illness (HPI) The following HPI elements were documented for the patient's wound: Location: left anterior shin Quality: Patient reports experiencing a dull pain to affected area(s). Severity: Patient states wound are getting worse. Duration: Patient has had the wound for < 3 weeks prior to presenting for treatment Timing: Pain in wound is Intermittent (comes and goes Context: The wound occurred when the patient blunt trauma against her left shin Modifying Factors: Other treatment(s) tried include:active Bactroban ointment locally Associated Signs and Symptoms: Patient reports having: occasional discharge from the wound 83 year old patient was seen in the ED about 10 days ago for a history of abrasion to the left lower extremity while she was boarding a bus and scraped the anterior part of her left leg. She has been a diabetic for many years and has been taking treatment regularly. Her past medical history is also significant for anemia arthritis constipation and hypertension and is status post abdominal hysterectomy. She has never been a smoker. after her ER visit she was advised to apply Bactroban ointment to the wound twice a day and continue to monitor her blood glucose levels. her last  hemoglobin A1c was done 3 years ago and was 6.6% 06/04/17 patient left anterior shin wound appears to be doing well although it is still somewhat dry despite the treatment with Medihoney. We have been using Kerlex following the Medihoney application and I believe this is just not retaining that much moisture. Nonetheless there is no evidence of infection. 06/11/17 on evaluation today patient appears to be doing a little bit worse in regard to her wound. The entirety of the wound is dry and unfortunately the Medihoney does not seem to be helping this. That is even with the dressing changes that I made last week to try to retain more moisture. She has not tried [employer] as of yet. She was also noncompressible when testing for ABIs. 07/01/2017 -- she had a arterial studies done and was seen by Dr. Lucky Cowboy. her noninvasive studies showed noncompressible vessels on the right but brisk waveforms and digital pressures of 71 on the right consistent with only mild arterial insufficienc. On the left her ABI was 0.47 and this may be falsely elevated due to calcification. No digital pressures were obtained on the left and this is consistent with severe arterial insufficiency. this critical limb threatening situation on the left, would make wound healing difficult and it represents a serious situation and he has recommended an angiography with possible revascularization. The patient desired to defer the decision to she discuss with the family members. 07/09/17 on evaluation today patient appears to be doing fairly well in regard to her left anterior lower extremity wound. She has been tolerating the dressing changes without complication we have been utilizing Santyl at this time. She tells me that she is having no significant pain compared to what she has had in the past. She has decided after discussing with family that she is going to go forward with the surgery with Dr. dew. This is to restore better blood flow to left  lower extremity. With that it does appear that her lower extremity wound is making some progress albeit slow. No fevers, chills, nausea, or vomiting noted at this time. LARRISA, CRAVEY ([id number]) 07/16/17 on evaluation today patient's wound appears to be doing roughly about the same although some of the slough is clearing off. [name] she has  her appointment with vascular next week on Thursday the 23rd. With that being said her wound does not appear to be hurting as badly as it has in the past which is good news. No fevers, chills, nausea, or vomiting noted at this time. 07/30/2017 -- she had surgery on 07/22/2017 --indications being nonhealing ulcer on the left leg with a noninvasive study showing marked reduction in ABI of less than 0.5 and no digital pressure on her left leg. she had a percutaneous transluminal angioplasty of the left common and external iliac arteries. She then had a stent placed to the left external iliac arteries and to the left common iliac artery. 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 The flow distally was poor and there were multiple areas of high-grade stenosis or occlusion of the distal SFA and the popliteal artery with only a diseased peroneal artery as runoff distally. She has a postop appointment on September 23 08/20/2017 -- she went back to the vascular office for an appointment on 08/12/2017 and bilateral ABIs were done which were notable for moderate right lower extremity arterial disease and unable to obtain left ankle brachial indices due to absent Doppler velocities in the posterior tibial artery and the anterior tibial artery. Monophasic waveforms to the left femoral and popliteal artery. this was worrisome for a failure of stents placed and lower extremity angiogram was recommended. as was done on 08/16/2017 and the stents placed in the left eye Lex system had some  mild narrowing at the iliac bifurcation and the leading edge of the stent proximally was poor wall apposition with stenosis in the 50-60% range in the right common iliac artery. There was also occlusion of the common femoral artery and the origin of the SFA and profunda femoris artery. Flow distally was poor but they appeared to be multiple areas of high-grade stenosis or occlusion in the distal SFA and popliteal artery with only a diseased peroneal artery. in another stent was placed and deployed. The femoral occlusion would need to be treated surgically. 08/27/17 on evaluation today patient appears to be doing about the same in regard to her left for extremity wound. Unfortunately secondary to her vascular status this is not a very well healing wound at this time. She has been tolerating the dressing changes she has some discomfort but mainly with cleansing of the wound not at other times. No fevers, chills, nausea, or vomiting noted at this time. 09/17/2017 -- the patient was recently discharged from the hospital where she was admitted between 09/08/2017 and 09/10/2017, when she had a procedure on her left femoral artery with the endarterectomy and a patch. she will be going back for review this coming week. 09/24/2017 -- was seen on 09/18/2017 in the ER, for bleeding from her wound which was debrided earlier during the day. At the time she was seen there was no active bleeding. A thrombin pad was placed over the area and she was asked to change the outside dressing but come back to the wound center as planned. she was also seen at the [facility] office for a review after her surgery and bilateral lower extremity ABI when compared to the previous ones showed minimal improvement of arterial blood flow. She was set up for a CT angiogram of the abdomen and pelvis and lower extremities to see the degree of peripheral arterial disease 10/28/2017 -- had a x-ray of  the left  tibia and fibula done on 10/07/2017 -- showed no bony acute abnormality she was reviewed by Dr. Corene Cornea dew on 10/26/2017, and he reviewed her CT angiogram and this demonstrated a widely patent left femoral endarterectomy including the origins of the femoral artery. She also had moderate distal disease in the popliteal artery, distal SFA and tibial disease. Her peroneal artery is a dominant runoff distally.he felt she had adequate perfusion for healing the mid calf level and he is putting off all surgical intervention for now and is going to see her back in 2-3 months for noninvasive studies. She also has a new wound on the plantar aspect of the left big toe where she pulled a piece of skin and has caused a superficial ulceration Patient History Information obtained from Patient. Social History Never smoker, Marital Status - Separated, Alcohol Use - Never, Drug Use - No History, Caffeine Use - Rarely. Chaikin, Davetta P. ([id number]) Objective Constitutional Pulse regular. Respirations normal and unlabored. Afebrile. Vitals Time Taken: 3:03 PM, Height: 65 in, Weight: 131 lbs, BMI: 21.8, Temperature: 98.2 F, Pulse: 60 bpm, Respiratory Rate: 16 breaths/min, Blood Pressure: 114/52 mmHg. Eyes Nonicteric. Reactive to light. Ears, Nose, Mouth, and Throat Lips, teeth, and gums WNL.Marland Kitchen Moist mucosa without lesions. Neck supple and nontender. No palpable supraclavicular or cervical adenopathy. Normal sized without goiter. Respiratory WNL. No retractions.. Cardiovascular Pedal Pulses WNL. No clubbing, cyanosis or edema. Lymphatic No adneopathy. No adenopathy. No adenopathy. Musculoskeletal Adexa without tenderness or enlargement.. Digits and nails w/o clubbing, cyanosis, infection, petechiae, ischemia, or inflammatory conditions.Marland Kitchen Psychiatric Judgement and insight Intact.. No evidence of depression, anxiety, or agitation.. General Notes: sharp debridement was done with a forcep and scissors  and some of the necrotic debris was removed. The bone is very much palpable along the mid third of the tibial anterior edge. The new wound on the plantar aspect of the left toe is fairly superficial. Integumentary (Hair, Skin) No suspicious lesions. No crepitus or fluctuance. No peri-wound warmth or erythema. No masses.. Wound #1 status is Open. Original cause of wound was Trauma. The wound is located on the Left,Anterior Lower Leg. The wound measures 8cm length x 3.7cm width x 0.2cm depth; 23.248cm^2 area and 4.65cm^3 volume. There is bone, Fat Layer (Subcutaneous Tissue) Exposed, and fascia exposed. There is no tunneling or undermining noted. There is a large amount of serous drainage noted. The wound margin is flat and intact. There is no granulation within the wound bed. There is a large (67-100%) amount of necrotic tissue within the wound bed including Adherent Slough. The periwound skin appearance exhibited: Scarring, Maceration, Hemosiderin Staining, Erythema. The periwound skin appearance did not exhibit: Callus, Crepitus, Excoriation, Induration, Rash, Dry/Scaly, Atrophie Blanche, Cyanosis, Ecchymosis, Mottled, Pallor, Rubor. The surrounding wound skin color is noted with erythema which is circumferential. Periwound temperature was noted as No Abnormality. The periwound has tenderness on palpation. Shipton, Linsey P. ([id number]) Wound #2 status is Open. Original cause of wound was Trauma. The wound is located on the Left Toe Great. The wound measures 0.4cm length x 0.7cm width x 0.1cm depth; 0.22cm^2 area and 0.022cm^3 volume. There is no tunneling or undermining noted. There is a medium amount of serous drainage noted. The wound margin is flat and intact. There is large (67-100%) red granulation within the wound bed. There is no necrotic tissue within the wound bed. The periwound skin appearance did not exhibit: Callus, Crepitus, Excoriation, Induration, Rash, Scarring, Dry/Scaly,  Maceration, Atrophie Blanche, Cyanosis, Ecchymosis, Hemosiderin Staining,  Mottled, Pallor, Rubor, Erythema. Periwound temperature was noted as No Abnormality. 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 There was a Skin/Subcutaneous Tissue Debridement ([id number]) debridement with total area of 6 sq cm performed by Christin Fudge, MD. with the following instrument(s): Forceps and Scissors to remove Viable and Non-Viable tissue/material including Fibrin/Slough, Eschar, and Subcutaneous after achieving pain control using Lidocaine 4% Topical Solution. A time out was conducted at 15:33, prior to the start of the procedure. A Minimum amount of bleeding was controlled with Pressure. The procedure was tolerated well with a pain level of 0 throughout and a pain level of 0 following the procedure. Post Debridement Measurements: 8cm length x 3.7cm width x 0.3cm depth; 6.974cm^3 volume. Character of Wound/Ulcer Post Debridement is improved. Severity of Tissue Post Debridement is: Bone involvement without necrosis. Post procedure Diagnosis Wound #1: Same as Pre-Procedure Plan Wound Cleansing: Wound #1 Left,Anterior Lower Leg: Clean wound with Normal Saline. Wound #2 Left Toe Great: Clean wound with Normal Saline. Anesthetic: Wound #1 Left,Anterior Lower Leg: Topical Lidocaine 4% cream applied to wound bed prior to debridement Wound #2 Left Toe Great: Topical Lidocaine 4% cream applied to wound bed prior to debridement Casasola, Delailah P. ([id number]) Primary Wound Dressing: Wound  #1 Left,Anterior Lower Leg: Santyl Ointment Wound #2 Left Toe Great: Foam Secondary Dressing: ABD pad Other - telfa, ABD pad, kerlix wrap and secure with tape Wound #2 Left Toe Great: Other - coverlett Dressing Change Frequency: Wound #1 Left,Anterior Lower Leg: Change dressing every day. Wound #2 Left Toe Great: Change dressing every day. Follow-up Appointments: Wound #1 Left,Anterior Lower Leg: Return Appointment in: - 3 weeks Wound #2 Left Toe Great: Return Appointment in: - 3 weeks I have reviewed her vascular opinion and also the recent x-ray of the left tibia. in view of the fact that there is no ostium mellitus of the bone and there is no arterial procedure planned, after review today, I have recommended: 1. Daily washing with soap and water and application of Santyl ointment locally with a light Kerlix dressing. 2. at a later stage she will benefit from the application of a wound VAC to help granulate the wound 3. good control of her diabetes mellitus 4. Adequate protein, vitamin A, vitamin C and zinc 5. Regular visits the wound center Electronic Signature(s) Signed: 10/28/2017 4:12:29 PM By: Christin Fudge MD, FACS Entered By: Christin Fudge on 10/28/2017 16:12:28 Brandis, [name] ([id number]) -------------------------------------------------------------------------------- ROS/PFSH Details Patient Name: [name] P. Date of Service: 10/28/2017 3:15 PM Medical Record Number: [id number] Patient Account Number: [id number] Date of Birth/Sex: 07/04/1926 (83 y.o. Female) Treating RN: Carolyne Fiscal, Debi Primary Care Provider: Lucianne Lei Other Clinician: Referring Provider: Lucianne Lei Treating Provider/Extender: Frann Rider in Treatment: 21 Information Obtained From Patient Wound History Do you currently have one or more open woundso Yes How many open wounds do you currently haveo 1 Approximately how long have you had your woundso 3 weeks How have you  been treating your wound(s) until nowo open to air Has your wound(s) ever healed and then re-openedo No Have you had any lab work done in the past montho  No Have you tested positive for an antibiotic resistant organism (MRSA, VRE)o No Have you tested positive for osteomyelitis (bone infection)o No Have you had any tests for circulation on your legso No Hematologic/Lymphatic Medical History: Positive for: Anemia Cardiovascular Medical History: Positive for: Hypertension Endocrine Medical History: Positive for: Type II Diabetes Treated with: Oral agents Musculoskeletal Medical History: Positive for: Osteoarthritis Oncologic Medical History: Negative for: Received Chemotherapy; Received Radiation Immunizations Pneumococcal Vaccine: Received Pneumococcal Vaccination: Yes Immunization Notes: up to date Implantable Devices Family and Social History DEANDRE, STANSEL ([id number]) Never smoker; Marital Status - Separated; Alcohol Use: Never; Drug Use: No History; Caffeine Use: Rarely; Financial Concerns: No; Food, Clothing or Shelter Needs: No; Support System Lacking: No; Transportation Concerns: No; Advanced Directives: No; Patient does not want information on Advanced Directives Physician Affirmation I have reviewed and agree with the above information. Electronic Signature(s) Signed: 10/28/2017 4:36:05 PM By: Christin Fudge MD, FACS Signed: 10/29/2017 4:39:39 PM By: Alric Quan Entered By: Christin Fudge on 10/28/2017 16:10:10 Fury, [name] ([id number]) -------------------------------------------------------------------------------- SuperBill Details Patient Name: [name] P. Date of Service: 10/28/2017 Medical Record Number: [id number] Patient Account Number: [id number] Date of Birth/Sex: Apr 29, 1926 (83 y.o. 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 Facility Procedures CPT4 Code: [id number] Description: 32951 - DEB SUBQ TISSUE 20 SQ CM/< ICD-10 Diagnosis Description E11.622 Type 2 diabetes mellitus with other skin ulcer 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.242 Atherosclerosis of native arteries of left  leg with ulceration Modifier: of calf Quantity: 1 Physician Procedures CPT4 Code: [id number] Description: 63016 - WC PHYS LEVEL 2 - EST PT ICD-10 Diagnosis Description E11.622 Type 2 diabetes mellitus with other skin ulcer 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.242 Atherosclerosis of native arteries of left  leg with ulceration Modifier: 25 of calf Quantity: 1 CPT4 Code: 0109323 Description: 55732 - WC PHYS SUBQ TISS 20 SQ CM ICD-10 Diagnosis Description E11.622 Type 2 diabetes mellitus with other skin ulcer 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.242 Atherosclerosis of native arteries of left  leg with ulceration Modifier: of calf Quantity: 1 Electronic Signature(s) Signed: 10/28/2017 4:12:50 PM By: Christin Fudge MD, FACS Entered By: Christin Fudge on 10/28/2017 16:12:50

## 2017-10-31 NOTE — Progress Notes (Signed)
SHADONNA, BENEDICK ([id number]) Visit Report for 10/28/2017 Arrival Information Details Patient Name: [name], [name]. Date of Service: 10/28/2017 3:15 PM Medical Record Number: [id number] Patient Account Number: [id number] Date of Birth/Sex: [birth year] (83 y.o. Female) Treating RN: Montey Hora Primary Care Cary Lothrop: Lucianne Lei Other Clinician: Referring Jacquel Redditt: Lucianne Lei Treating Enmanuel Zufall/Extender: Frann Rider in Treatment: 21 Visit Information History Since Last Visit Added or deleted any medications: No Patient Arrived: Cane Any new allergies or adverse reactions: No Arrival Time: 15:02 Had a fall or experienced change in No Accompanied By: self activities of daily living that may affect Transfer Assistance: None risk of falls: Patient Identification Verified: Yes Signs or symptoms of abuse/neglect since last visito No Secondary Verification Process Completed: Yes Hospitalized since last visit: No Patient Requires Transmission-Based Precautions: No Has Dressing in Place as Prescribed: Yes Patient Has Alerts: Yes Pain Present Now: No Electronic Signature(s) Signed: 10/29/2017 4:46:33 PM By: Montey Hora Entered By: Montey Hora on 10/28/2017 15:02:42 Heng, UXNATFTD D. ([id number]) -------------------------------------------------------------------------------- Encounter Discharge Information Details Patient Name: Alexa Lowes [name]. Date of Service: 10/28/2017 3:15 PM Medical Record Number: [id number] Patient Account Number: [id number] Date of Birth/Sex: Nov 18, 1926 (83 y.o. Female) Treating RN: Montey Hora Primary Care Paydon Carll: Lucianne Lei Other Clinician: Referring Dago Jungwirth: Lucianne Lei Treating Fitzhugh Vizcarrondo/Extender: Frann Rider in Treatment: 21 Encounter Discharge Information Items Discharge Pain Level: 0 Discharge Condition: Stable Ambulatory Status: Cane Discharge Destination: Home Private Transportation: Auto Accompanied  By: self Schedule Follow-up Appointment: Yes Medication Reconciliation completed and provided No to Patient/Care Lavaeh Bau: Clinical Summary of Care: Electronic Signature(s) Signed: 10/28/2017 4:29:03 PM By: Montey Hora Entered By: Montey Hora on 10/28/2017 16:29:02 Prine, DVVOHYWV [name]. ([id number]) -------------------------------------------------------------------------------- Lower Extremity Assessment Details Patient Name: Alexa Lowes [name]. Date of Service: 10/28/2017 3:15 PM Medical Record Number: [id number] Patient Account Number: [id number] Date of Birth/Sex: 07/11/1926 (83 y.o. Female) Treating RN: Montey Hora Primary Care Jaizon Deroos: Lucianne Lei Other Clinician: Referring Kyrus Hyde: Lucianne Lei Treating Bastian Andreoli/Extender: Frann Rider in Treatment: 21 Vascular Assessment Pulses: Dorsalis Pedis Palpable: [Left:Yes] Posterior Tibial Extremity colors, hair growth, and conditions: Extremity Color: [Left:Hyperpigmented] Hair Growth on Extremity: [Left:No] Temperature of Extremity: [Left:Warm] Capillary Refill: [Left:< 3 seconds] Toe Nail Assessment Left: Right: Thick: Yes Discolored: Yes Deformed: Yes Improper Length and Hygiene: Yes Electronic Signature(s) Signed: 10/29/2017 4:46:33 PM By: Montey Hora Entered By: Montey Hora on 10/28/2017 15:12:37 [name], [name]. ([id number]) -------------------------------------------------------------------------------- Multi Wound Chart Details Patient Name: Alexa Lowes [name]. Date of Service: 10/28/2017 3:15 PM Medical Record Number: [id number] Patient Account Number: [id number] Date of Birth/Sex: [birth year] (83 y.o. 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] [N/A:N/A] Secondary Etiology: [1:Trauma, Other] [2:N/A] [N/A:N/A] Comorbid History: [1:Anemia, Hypertension, Type II Diabetes, Osteoarthritis] [2:Anemia, Hypertension, Type II Diabetes, Osteoarthritis] [N/A:N/A] Date Acquired: [1:05/02/2017] [2:10/28/2017] [N/A:N/A] Weeks of Treatment: [1:21] [2:0] [N/A:N/A] Wound Status: [1:Open] [2:Open] [N/A:N/A] Measurements L x W x D [1:8x3.7x0.2] [2:0.4x0.7x0.1] [N/A:N/A] (cm) Area (cm) : [1:23.248] [2:0.22] [N/A:N/A] Volume (cm) : [1:4.65] [2:0.022] [N/A:N/A] % Reduction in Area: [1:-383.60%] [2:N/A] [N/A:N/A] % Reduction in Volume: [1:-383.90%] [2:N/A] [N/A:N/A] Classification: [1:Grade 2] [2:Full Thickness Without Exposed Support  Structures] [N/A:N/A] Exudate Amount: [1:Large] [2:Medium] [N/A:N/A] Exudate Type: [1:Serous] [2:Serous] [N/A:N/A] Exudate Color: [1:amber] [2:amber] [N/A:N/A] Wound Margin: [1:Flat and Intact] [2:Flat and Intact] [N/A:N/A] Granulation Amount: [1:None Present (0%)] [2:Large (67-100%)] [N/A:N/A] Granulation Quality: [1:N/A] [2:Red] [N/A:N/A] Necrotic Amount: [1:Large (67-100%)] [2:None Present (0%)] [N/A:N/A] Exposed Structures: [1:Fascia: Yes Fat Layer (Subcutaneous Tissue) Exposed: Yes Bone: Yes Tendon: No Muscle: No Joint: No] [2:Fascia: No Fat Layer (Subcutaneous Tissue) Exposed: No Tendon: No Muscle: No Joint: No Bone: No] [N/A:N/A] Epithelialization: [1:None] [2:None] [N/A:N/A] Debridement: [1:Debridement ([id number])] [2:N/A] [N/A:N/A] Pre-procedure [1:15:33] [2:N/A] [N/A:N/A] Verification/Time Out Taken: [name], [name] ([id number]) Pain Control: Lidocaine 4%  Topical Solution N/A N/A Tissue Debrided: Necrotic/Eschar, N/A N/A Fibrin/Slough, Subcutaneous Level: Skin/Subcutaneous Tissue N/A N/A Debridement Area (sq cm): 6 N/A N/A Instrument: Forceps, Scissors N/A N/A Bleeding: Minimum N/A N/A Hemostasis Achieved: Pressure N/A N/A Procedural Pain: 0 N/A N/A Post Procedural Pain: 0 N/A N/A Debridement Treatment Procedure was tolerated well N/A N/A Response: Post Debridement 8x3.7x0.3 N/A N/A Measurements L x W x D (cm) Post Debridement Volume: 6.974 N/A N/A (cm) Periwound Skin Texture: Scarring: Yes Excoriation: No N/A Excoriation: No Induration: No Induration: No Callus: No Callus: No Crepitus: No Crepitus: No Rash: No Rash: No Scarring: No Periwound Skin Moisture: Maceration: Yes Maceration: No N/A Dry/Scaly: No Dry/Scaly: No Periwound Skin Color: Erythema: Yes Atrophie Blanche: No N/A Hemosiderin Staining: Yes Cyanosis: No Atrophie Blanche: No Ecchymosis: No Cyanosis: No Erythema: No Ecchymosis: No Hemosiderin Staining: No Mottled: No Mottled: No Pallor: No Pallor: No Rubor: No Rubor: No Erythema Location: Circumferential N/A N/A Temperature: No Abnormality No Abnormality N/A Tenderness on Palpation: Yes No N/A Wound Preparation: Ulcer Cleansing: Ulcer Cleansing: N/A Rinsed/Irrigated with Saline Rinsed/Irrigated with Saline Topical Anesthetic Applied: Topical Anesthetic Applied: Other: lidocaine 4% Other: lidocaine 4% Procedures Performed: Debridement N/A N/A Treatment Notes Electronic Signature(s) Signed: 10/28/2017 4:09:23 PM By: Christin Fudge MD, FACS Entered By: Christin Fudge on 10/28/2017 16:09:23 [name], [name] ([id number]) -------------------------------------------------------------------------------- [place] Details Patient Name: Alexa Lowes [name]. Date of Service: 10/28/2017 3:15 PM Medical Record Number: [id number] Patient Account Number: [id number] Date of  Birth/Sex: 06-Feb-1926 (83 y.o. Female) Treating RN: Montey Hora Primary Care Pyper Olexa: Lucianne Lei Other Clinician: Referring Milianna Ericsson: Lucianne Lei Treating Roey Coopman/Extender: Frann Rider in Treatment: 21 Active Inactive ` Abuse / Safety / Falls / Self Care Management Nursing Diagnoses: Potential for falls Goals: Patient will remain injury free related to falls Date Initiated: 05/28/2017 Target Resolution Date: 07/23/2017 Goal Status: Active Interventions: Assess fall risk on admission and as needed Notes: ` Orientation to the Wound Care Program Nursing Diagnoses: Knowledge deficit related to the wound healing center program Goals: Patient/caregiver will verbalize understanding of the [facility] Date Initiated: 05/28/2017 Target Resolution Date: 07/23/2017 Goal Status: Active Interventions: Provide education on orientation to the wound center Notes: ` Wound/Skin Impairment Nursing Diagnoses: Impaired tissue integrity Goals: Ulcer/skin breakdown will have a volume reduction of 30% by week 4 Date Initiated: 05/28/2017 Target Resolution Date: 07/23/2017 Goal Status: Active Ulcer/skin breakdown will have a volume reduction of 50% by week 8 Date Initiated: 05/28/2017 Target Resolution Date: 07/23/2017 CHERELLE, MIDKIFF ([id number]) Goal Status: Active Ulcer/skin breakdown will have a volume reduction of 80% by week 12 Date Initiated: 05/28/2017 Target Resolution Date: 07/23/2017 Goal Status: Active Ulcer/skin breakdown will heal within 14 weeks Date Initiated: 05/28/2017 Target Resolution Date: 07/23/2017 Goal Status: Active Interventions: Assess patient/caregiver ability to obtain necessary supplies Assess patient/caregiver ability to perform ulcer/skin care regimen upon admission and as  needed Assess ulceration(s) every visit Notes: Electronic Signature(s) Signed: 10/29/2017 4:46:33 PM By: Montey Hora Entered By: Montey Hora on  10/28/2017 15:32:14 [name], [name]. ([id number]) -------------------------------------------------------------------------------- Pain Assessment Details Patient Name: Alexa Lowes [name]. Date of Service: 10/28/2017 3:15 PM Medical Record Number: [id number] Patient Account Number: [id number] Date of Birth/Sex: 06/10/1926 (83 y.o. Female) Treating RN: Montey Hora Primary Care Denaja Verhoeven: Lucianne Lei Other Clinician: Referring Charlene Cowdrey: Lucianne Lei Treating Charman Blasco/Extender: Frann Rider in Treatment: 21 Active Problems Location of Pain Severity and Description of Pain Patient Has Paino Yes Site Locations Pain Location: Pain in Ulcers With Dressing Change: Yes Duration of the Pain. Constant / Intermittento Intermittent Pain Management and Medication Current Pain Management: Electronic Signature(s) Signed: 10/29/2017 4:46:33 PM By: Montey Hora Entered By: Montey Hora on 10/28/2017 15:02:52 [name], [name] ([id number]) -------------------------------------------------------------------------------- Patient/Caregiver Education Details Patient Name: Alexa Lowes [name]. Date of Service: 10/28/2017 3:15 PM Medical Record Number: [id number] Patient Account Number: [id number] Date of Birth/Gender: 1926-11-03 (83 y.o. Female) Treating RN: Montey Hora Primary Care Physician: Lucianne Lei Other Clinician: Referring Physician: Lucianne Lei Treating Physician/Extender: Frann Rider in Treatment: 21 Education Assessment Education Provided To: Patient Education Topics Provided Wound/Skin Impairment: Handouts: Other: wound care as ordered Methods: Demonstration, Explain/Verbal Responses: State content correctly Electronic Signature(s) Signed: 10/29/2017 4:46:33 PM By: Montey Hora Entered By: Montey Hora on 10/28/2017 16:29:16 [name], [name]. ([id number]) -------------------------------------------------------------------------------- Wound  Assessment Details Patient Name: Alexa Lowes [name]. Date of Service: 10/28/2017 3:15 PM Medical Record Number: [id number] Patient Account Number: [id number] Date of Birth/Sex: 30-Apr-1926 (83 y.o. Female) Treating RN: Montey Hora Primary Care Ginni Eichler: Lucianne Lei Other Clinician: Referring Kekoa Fyock: Lucianne Lei Treating Mekia Dipinto/Extender: Frann Rider in Treatment: 21 Wound Status Wound Number: 1 Primary Etiology: Diabetic Wound/Ulcer of the Lower Extremity Wound Location: Left Lower Leg - Anterior Secondary Trauma, Other Wounding Event: Trauma Etiology: Date Acquired: 05/02/2017 Wound Status: Open Weeks Of Treatment: 21 Comorbid Anemia, Hypertension, Type II Diabetes, Clustered Wound: No History: Osteoarthritis Photos Photo Uploaded By: Gretta Cool, BSN, RN, CWS, Kim on 10/29/2017 12:57:58 Wound Measurements Length: (cm) 8 Width: (cm) 3.7 Depth: (cm) 0.2 Area: (cm) 23.248 Volume: (cm) 4.65 % Reduction in Area: -383.6% % Reduction in Volume: -383.9% Epithelialization: None Tunneling: No Undermining: No Wound Description Classification: Grade 2 Foul Odor Wound Margin: Flat and Intact Slough/Fi Exudate Amount: Large Exudate Type: Serous Exudate Color: amber After Cleansing: No brino Yes Wound Bed Granulation Amount: None Present (0%) Exposed Structure Necrotic Amount: Large (67-100%) Fascia Exposed: Yes Necrotic Quality: Adherent Slough Fat Layer (Subcutaneous Tissue) Exposed: Yes Tendon Exposed: No Muscle Exposed: No Joint Exposed: No Bone Exposed: Yes Periwound Skin Texture [name], [name]. ([id number]) Texture Color No Abnormalities Noted: No No Abnormalities Noted: No Callus: No Atrophie Blanche: No Crepitus: No Cyanosis: No Excoriation: No Ecchymosis: No Induration: No Erythema: Yes Rash: No Erythema Location: Circumferential Scarring: Yes Hemosiderin Staining: Yes Mottled: No Moisture Pallor: No No Abnormalities Noted:  No Rubor: No Dry / Scaly: No Maceration: Yes Temperature / Pain Temperature: No Abnormality Tenderness on Palpation: Yes Wound Preparation Ulcer Cleansing: Rinsed/Irrigated with Saline Topical Anesthetic Applied: Other: lidocaine 4%, Treatment Notes Wound #1 (Left, Anterior Lower Leg) 1. Cleansed with: Clean wound with Normal Saline 2. Anesthetic Topical Lidocaine 4% cream to wound bed prior to debridement 4. Dressing Applied: Santyl Ointment 5. Secondary Dressing Applied Dry Gauze Kerlix/Conform Notes kerlix wrap and netting Electronic Signature(s) Signed: 10/29/2017 4:46:33 PM By: Montey Hora Entered By: Montey Hora on 10/28/2017 15:08:50 [name], [name]. ([id number]) --------------------------------------------------------------------------------  Wound Assessment Details Patient Name: [name], [name]. Date of Service: 10/28/2017 3:15 PM Medical Record Number: [id number] Patient Account Number: [id number] Date of Birth/Sex: [birth year] (83 y.o. Female) Treating RN: Montey Hora Primary Care Arelyn Gauer: Lucianne Lei Other Clinician: Referring Lukisha Procida: Lucianne Lei Treating Zakai Gonyea/Extender: Frann Rider in Treatment: 21 Wound Status Wound Number: 2 Primary Arterial Insufficiency Ulcer Etiology: Wound Location: Left Toe Great Wound Status: Open Wounding Event: Trauma Comorbid Anemia, Hypertension, Type II Diabetes, Date Acquired: 10/28/2017 History: Osteoarthritis Weeks Of Treatment: 0 Clustered Wound: No Photos Photo Uploaded By: Gretta Cool, BSN, RN, CWS, Kim on 10/29/2017 12:57:58 Wound Measurements Length: (cm) 0.4 Width: (cm) 0.7 Depth: (cm) 0.1 Area: (cm) 0.22 Volume: (cm) 0.022 % Reduction in Area: % Reduction in Volume: Epithelialization: None Tunneling: No Undermining: No Wound Description Full Thickness Without Exposed Support Foul Odo Classification: Structures Slough/F Wound Margin: Flat and  Intact Exudate Medium Amount: Exudate Type: Serous Exudate Color: amber r After Cleansing: No ibrino No Wound Bed Granulation Amount: Large (67-100%) Exposed Structure Granulation Quality: Red Fascia Exposed: No Necrotic Amount: None Present (0%) Fat Layer (Subcutaneous Tissue) Exposed: No Tendon Exposed: No Muscle Exposed: No Joint Exposed: No Bone Exposed: No [name], Fleta [name]. ([id number]) Periwound Skin Texture Texture Color No Abnormalities Noted: No No Abnormalities Noted: No Callus: No Atrophie Blanche: No Crepitus: No Cyanosis: No Excoriation: No Ecchymosis: No Induration: No Erythema: No Rash: No Hemosiderin Staining: No Scarring: No Mottled: No Pallor: No Moisture Rubor: No No Abnormalities Noted: No Dry / Scaly: No Temperature / Pain Maceration: No Temperature: No Abnormality Wound Preparation Ulcer Cleansing: Rinsed/Irrigated with Saline Topical Anesthetic Applied: Other: lidocaine 4%, Treatment Notes Wound #2 (Left Toe Great) 1. Cleansed with: Clean wound with Normal Saline 2. Anesthetic Topical Lidocaine 4% cream to wound bed prior to debridement 4. Dressing Applied: Foam Other dressing (specify in notes) Notes coverlett Electronic Signature(s) Signed: 10/29/2017 4:46:33 PM By: Montey Hora Entered By: Montey Hora on 10/28/2017 15:10:10 Noren, [name] ([id number]) -------------------------------------------------------------------------------- Vitals Details Patient Name: Alexa Lowes [name]. Date of Service: 10/28/2017 3:15 PM Medical Record Number: [id number] Patient Account Number: [id number] Date of Birth/Sex: Feb 29, 1926 (83 y.o. Female) Treating RN: Montey Hora Primary Care Staceyann Knouff: Lucianne Lei Other Clinician: Referring Sylar Voong: Lucianne Lei Treating Eugena Rhue/Extender: Frann Rider in Treatment: 21 Vital Signs Time Taken: 15:03 Temperature (F): 98.2 Height (in): 65 Pulse (bpm): 60 Weight (lbs):  131 Respiratory Rate (breaths/min): 16 Body Mass Index (BMI): 21.8 Blood Pressure (mmHg): 114/52 Reference Range: 80 - 120 mg / dl Electronic Signature(s) Signed: 10/29/2017 4:46:33 PM By: Montey Hora Entered By: Montey Hora on 10/28/2017 15:04:56

## 2017-11-04 ENCOUNTER — Encounter: Payer: Medicare Other | Attending: Surgery | Admitting: Surgery

## 2017-11-04 DIAGNOSIS — D649 Anemia, unspecified: Secondary | ICD-10-CM | POA: Insufficient documentation

## 2017-11-04 DIAGNOSIS — I10 Essential (primary) hypertension: Secondary | ICD-10-CM | POA: Insufficient documentation

## 2017-11-04 DIAGNOSIS — Z7984 Long term (current) use of oral hypoglycemic drugs: Secondary | ICD-10-CM | POA: Diagnosis not present

## 2017-11-04 DIAGNOSIS — E11622 Type 2 diabetes mellitus with other skin ulcer: Secondary | ICD-10-CM | POA: Diagnosis not present

## 2017-11-04 DIAGNOSIS — L97222 Non-pressure chronic ulcer of left calf with fat layer exposed: Secondary | ICD-10-CM | POA: Diagnosis not present

## 2017-11-04 DIAGNOSIS — I70242 Atherosclerosis of native arteries of left leg with ulceration of calf: Secondary | ICD-10-CM | POA: Diagnosis not present

## 2017-11-04 DIAGNOSIS — M1990 Unspecified osteoarthritis, unspecified site: Secondary | ICD-10-CM | POA: Insufficient documentation

## 2017-11-06 NOTE — Progress Notes (Signed)
NARE, GASPARI ([id number]) Visit Report for 11/04/2017 Arrival Information Details Patient Name: [name], [name] Date of Service: 11/04/2017 1:30 PM Medical Record Number: [id number] Patient Account Number: [id number] Date of Birth/Sex: March 16, 1926 (83 y.o. Female) Treating RN: Montey Hora Primary Care Tanay Massiah: Lucianne Lei Other Clinician: Referring Salia Cangemi: Lucianne Lei Treating Rollins Wrightson/Extender: Frann Rider in Treatment: 22 Visit Information History Since Last Visit Added or deleted any medications: No Patient Arrived: Cane Any new allergies or adverse reactions: No Arrival Time: 13:25 Had a fall or experienced change in No Accompanied By: self activities of daily living that may affect Transfer Assistance: None risk of falls: Patient Identification Verified: Yes Signs or symptoms of abuse/neglect since last visito No Secondary Verification Process Completed: Yes Hospitalized since last visit: No Patient Requires Transmission-Based Precautions: No Has Dressing in Place as Prescribed: Yes Patient Has Alerts: Yes Pain Present Now: No Electronic Signature(s) Signed: 11/04/2017 4:53:14 PM By: Montey Hora Entered By: Montey Hora on 105/11/2016 13:26:07 Oplinger, HYWVPXTG P. ([id number]) -------------------------------------------------------------------------------- Clinic Level of Care Assessment Details Patient Name: Alexa Lowes P. Date of Service: 11/04/2017 1:30 PM Medical Record Number: [id number] Patient Account Number: [id number] Date of Birth/Sex: 05/27/1926 (83 y.o. Female) Treating RN: Montey Hora Primary Care Abhijay Morriss: Lucianne Lei Other Clinician: Referring Brianah Hopson: Lucianne Lei Treating Salwa Bai/Extender: Frann Rider in Treatment: 22 Clinic Level of Care Assessment Items TOOL 4 Quantity Score []  - Use when only an EandM is performed on FOLLOW-UP visit 0 ASSESSMENTS - Nursing Assessment / Reassessment X - Reassessment of  Co-morbidities (includes updates in patient status) 1 10 X- 1 5 Reassessment of Adherence to Treatment Plan ASSESSMENTS - Wound and Skin Assessment / Reassessment []  - Simple Wound Assessment / Reassessment - one wound 0 X- 2 5 Complex Wound Assessment / Reassessment - multiple wounds []  - 0 Dermatologic / Skin Assessment (not related to wound area) ASSESSMENTS - Focused Assessment []  - Circumferential Edema Measurements - multi extremities 0 []  - 0 Nutritional Assessment / Counseling / Intervention X- 1 5 Lower Extremity Assessment (monofilament, tuning fork, pulses) []  - 0 Peripheral Arterial Disease Assessment (using hand held doppler) ASSESSMENTS - Ostomy and/or Continence Assessment and Care []  - Incontinence Assessment and Management 0 []  - 0 Ostomy Care Assessment and Management (repouching, etc.) PROCESS - Coordination of Care X - Simple Patient / Family Education for ongoing care 1 15 []  - 0 Complex (extensive) Patient / Family Education for ongoing care []  - 0 Staff obtains Programmer, systems, Records, Test Results / Process Orders []  - 0 Staff telephones HHA, Nursing Homes / Clarify orders / etc []  - 0 Routine Transfer to another Facility (non-emergent condition) []  - 0 Routine Hospital Admission (non-emergent condition) []  - 0 New Admissions / Biomedical engineer / Ordering NPWT, Apligraf, etc. []  - 0 Emergency Hospital Admission (emergent condition) X- 1 10 Simple Discharge Coordination Archambeault, [name] ([id number]) []  - 0 Complex (extensive) Discharge Coordination PROCESS - Special Needs []  - Pediatric / Minor Patient Management 0 []  - 0 Isolation Patient Management []  - 0 Hearing / Language / Visual special needs []  - 0 Assessment of Community assistance (transportation, D/C planning, etc.) []  - 0 Additional assistance / Altered mentation []  - 0 Support Surface(s) Assessment (bed, cushion, seat, etc.) INTERVENTIONS - Wound Cleansing / Measurement []   - Simple Wound Cleansing - one wound 0 X- 2 5 Complex Wound Cleansing - multiple wounds X- 1 5 Wound Imaging (photographs - any number of wounds) []  - 0 Wound Tracing (instead of photographs) []  -  0 Simple Wound Measurement - one wound X- 2 5 Complex Wound Measurement - multiple wounds INTERVENTIONS - Wound Dressings X - Small Wound Dressing one or multiple wounds 2 10 []  - 0 Medium Wound Dressing one or multiple wounds []  - 0 Large Wound Dressing one or multiple wounds X- 1 5 Application of Medications - topical []  - 0 Application of Medications - injection INTERVENTIONS - Miscellaneous []  - External ear exam 0 []  - 0 Specimen Collection (cultures, biopsies, blood, body fluids, etc.) []  - 0 Specimen(s) / Culture(s) sent or taken to Lab for analysis []  - 0 Patient Transfer (multiple staff / Civil Service fast streamer / Similar devices) []  - 0 Simple Staple / Suture removal (25 or less) []  - 0 Complex Staple / Suture removal (26 or more) []  - 0 Hypo / Hyperglycemic Management (close monitor of Blood Glucose) []  - 0 Ankle / Brachial Index (ABI) - do not check if billed separately X- 1 5 Vital Signs Geddes, [name] ([id number]) Has the patient been seen at the hospital within the last three years: Yes Total Score: 110 Level Of Care: New/Established - Level 3 Electronic Signature(s) Signed: 11/04/2017 4:53:14 PM By: Montey Hora Entered By: Montey Hora on 112-Jul-202018 14:31:50 Mcculley, [name] ([id number]) -------------------------------------------------------------------------------- Encounter Discharge Information Details Patient Name: Alexa Lowes P. Date of Service: 11/04/2017 1:30 PM Medical Record Number: [id number] Patient Account Number: [id number] Date of Birth/Sex: [birth year] (83 y.o. Female) Treating RN: Montey Hora Primary Care Manna Gose: Lucianne Lei Other Clinician: Referring Colin Ellers: Lucianne Lei Treating Blossie Raffel/Extender: Frann Rider in  Treatment: 22 Encounter Discharge Information Items Discharge Pain Level: 0 Discharge Condition: Stable Ambulatory Status: Cane Discharge Destination: Home Transportation: Private Auto Accompanied By: self Schedule Follow-up Appointment: Yes Medication Reconciliation completed and No provided to Patient/Care Daryn Pisani: Provided on Clinical Summary of Care: 11/04/2017 Form Type Recipient Paper Patient GW Electronic Signature(s) Signed: 11/04/2017 2:33:21 PM By: Montey Hora Entered By: Montey Hora on 112-Jul-202018 14:33:20 Stacey, Alexa L. ([id number]) -------------------------------------------------------------------------------- Lower Extremity Assessment Details Patient Name: Alexa Lowes P. Date of Service: 11/04/2017 1:30 PM Medical Record Number: [id number] Patient Account Number: [id number] Date of Birth/Sex: 11-May-1926 (83 y.o. Female) Treating RN: Montey Hora Primary Care Versia Mignogna: Lucianne Lei Other Clinician: Referring Lorrinda Ramstad: Lucianne Lei Treating Jimmie Rueter/Extender: Frann Rider in Treatment: 22 Vascular Assessment Pulses: Dorsalis Pedis Palpable: [Left:Yes] Posterior Tibial Extremity colors, hair growth, and conditions: Extremity Color: [Left:Hyperpigmented] Hair Growth on Extremity: [Left:No] Temperature of Extremity: [Left:Warm] Capillary Refill: [Left:< 3 seconds] Electronic Signature(s) Signed: 11/04/2017 4:53:14 PM By: Montey Hora Entered By: Montey Hora on 112-Jul-202018 13:41:20 Sinnett, [name] ([id number]) -------------------------------------------------------------------------------- Multi Wound Chart Details Patient Name: Alexa Lowes P. Date of Service: 11/04/2017 1:30 PM Medical Record Number: [id number] Patient Account Number: [id number] Date of Birth/Sex: January 19, 1926 (83 y.o. 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] [N/A:N/A] Secondary Etiology: [1:Trauma, Other] [2:N/A] [N/A:N/A] Comorbid History: [1:Anemia, Hypertension, Type II Diabetes, Osteoarthritis] [2:Anemia, Hypertension, Type II Diabetes, Osteoarthritis] [N/A:N/A] Date Acquired: [1:05/02/2017] [2:10/28/2017] [N/A:N/A] Weeks of Treatment: [1:22] [2:1] [N/A:N/A] Wound Status: [1:Open] [2:Open] [N/A:N/A] Measurements L x W x D [1:7.6x3.7x0.2] [2:0.4x0.6x0.1] [  N/A:N/A] (cm) Area (cm) : [1:22.085] [2:0.188] [N/A:N/A] Volume (cm) : [1:4.417] [2:0.019] [N/A:N/A] % Reduction in Area: [1:-359.40%] [2:14.50%] [N/A:N/A] % Reduction in Volume: [1:-359.60%] [2:13.60%] [N/A:N/A] Classification: [1:Grade 2] [2:Full Thickness Without Exposed Support Structures] [N/A:N/A] Exudate Amount: [1:Large] [2:Medium] [N/A:N/A] Exudate Type: [1:Serous] [2:Serous] [N/A:N/A] Exudate Color: [1:amber] [2:amber] [N/A:N/A] Wound Margin: [1:Flat and Intact] [2:Flat and Intact] [N/A:N/A] Granulation Amount: [1:None Present (0%)] [2:None Present (0%)] [N/A:N/A] Necrotic Amount: [1:Large (67-100%)] [2:Large (67-100%)] [N/A:N/A] Necrotic Tissue: [1:Adherent Slough] [2:Eschar, Adherent Slough] [N/A:N/A] Exposed Structures: [1:Fascia: Yes Fat Layer (Subcutaneous Tissue) Exposed: Yes Bone: Yes Tendon: No Muscle: No Joint: No] [2:Fascia: No Fat Layer (Subcutaneous Tissue) Exposed: No Tendon: No Muscle: No Joint: No Bone: No] [N/A:N/A] Epithelialization: [1:None] [2:None]  [N/A:N/A] Periwound Skin Texture: [1:Scarring: Yes Excoriation: No Induration: No] [2:Excoriation: No Induration: No Callus: No] [N/A:N/A] Callus: No Crepitus: No Crepitus: No Rash: No Rash: No Scarring: No Periwound Skin Moisture: Maceration: Yes Maceration: No N/A Dry/Scaly: No Dry/Scaly: No Periwound Skin Color: Erythema: Yes Atrophie Blanche: No N/A Hemosiderin Staining: Yes Cyanosis: No Atrophie Blanche: No Ecchymosis: No Cyanosis: No Erythema: No Ecchymosis: No Hemosiderin Staining: No Mottled: No Mottled: No Pallor: No Pallor: No Rubor: No Rubor: No Erythema Location: Circumferential N/A N/A Temperature: No Abnormality No Abnormality N/A Tenderness on Palpation: Yes No N/A Wound Preparation: Ulcer Cleansing: Ulcer Cleansing: N/A Rinsed/Irrigated with Saline Rinsed/Irrigated with Saline Topical Anesthetic Applied: Topical Anesthetic Applied: Other: lidocaine 4% Other: lidocaine 4% Treatment Notes Electronic Signature(s) Signed: 11/04/2017 1:53:51 PM By: Christin Fudge MD, FACS Entered By: Christin Fudge on 12020-12-1316 13:53:50 Spindler, [name] ([id number]) -------------------------------------------------------------------------------- [place] Details Patient Name: Alexa Lowes P. Date of Service: 11/04/2017 1:30 PM Medical Record Number: [id number] Patient Account Number: [id number] Date of Birth/Sex: 06/10/1926 (83 y.o. Female) Treating RN: Montey Hora Primary Care Coda Filler: Lucianne Lei Other Clinician: Referring Icel Castles: Lucianne Lei Treating Lakhia Gengler/Extender: Frann Rider in Treatment: 22 Active Inactive ` Abuse / Safety / Falls / Self Care Management Nursing Diagnoses: Potential for falls Goals: Patient will remain injury free related to falls Date Initiated: 05/28/2017 Target Resolution Date: 07/23/2017 Goal Status: Active Interventions: Assess fall risk on admission and as needed Notes: ` Orientation  to the Wound Care Program Nursing Diagnoses: Knowledge deficit related to the wound healing center program Goals: Patient/caregiver will verbalize understanding of the [place] Program Date Initiated: 05/28/2017 Target Resolution Date: 07/23/2017 Goal Status: Active Interventions: Provide education on orientation to the wound center Notes: ` Wound/Skin Impairment Nursing Diagnoses: Impaired tissue integrity Goals: Ulcer/skin breakdown will have a volume reduction of 30% by week 4 Date Initiated: 05/28/2017 Target Resolution Date: 07/23/2017 Goal Status: Active Ulcer/skin breakdown will have a volume reduction of 50% by week 8 Date Initiated: 05/28/2017 Target Resolution Date: 07/23/2017 [name], [name] ([id number]) Goal Status: Active Ulcer/skin breakdown will have a volume reduction of 80% by week 12 Date Initiated: 05/28/2017 Target Resolution Date: 07/23/2017 Goal Status: Active Ulcer/skin breakdown will heal within 14 weeks Date Initiated: 05/28/2017 Target Resolution Date: 07/23/2017 Goal Status: Active Interventions: Assess patient/caregiver ability to obtain necessary supplies Assess patient/caregiver ability to perform ulcer/skin care regimen upon admission and as needed Assess ulceration(s) every visit Notes: Electronic Signature(s) Signed: 11/04/2017 4:53:14 PM By: Montey Hora Entered By: Montey Hora on 12020-12-1316 13:41:26 Migliaccio, CXKGYJEH P. ([id number]) -------------------------------------------------------------------------------- Pain Assessment Details Patient Name: Alexa Lowes P. Date of Service: 11/04/2017 1:30 PM Medical Record Number: [id number] Patient Account Number: [id number] Date of Birth/Sex: 1926/08/27 (83 y.o. Female) Treating RN: Montey Hora Primary Care  Hubert Derstine: Lucianne Lei Other Clinician: Referring Monteen Toops: Lucianne Lei Treating Asiel Chrostowski/Extender: Frann Rider in Treatment: 22 Active Problems Location of  Pain Severity and Description of Pain Patient Has Paino No Site Locations Pain Management and Medication Current Pain Management: Electronic Signature(s) Signed: 11/04/2017 4:53:14 PM By: Montey Hora Entered By: Montey Hora on 1May 17, 202018 13:27:52 Arnott, [name] ([id number]) -------------------------------------------------------------------------------- Patient/Caregiver Education Details Patient Name: Alexa Lowes P. Date of Service: 11/04/2017 1:30 PM Medical Record Number: [id number] Patient Account Number: [id number] Date of Birth/Gender: Apr 12, 1926 (83 y.o. Female) Treating RN: Montey Hora Primary Care Physician: Lucianne Lei Other Clinician: Referring Physician: Lucianne Lei Treating Physician/Extender: Frann Rider in Treatment: 22 Education Assessment Education Provided To: Patient Education Topics Provided Wound/Skin Impairment: Handouts: Other: wound care as ordered Methods: Demonstration, Explain/Verbal Responses: State content correctly Electronic Signature(s) Signed: 11/04/2017 4:53:14 PM By: Montey Hora Entered By: Montey Hora on 1May 17, 202018 14:33:39 Riccobono, XHBZJIRC P. ([id number]) -------------------------------------------------------------------------------- Wound Assessment Details Patient Name: Alexa Lowes P. Date of Service: 11/04/2017 1:30 PM Medical Record Number: [id number] Patient Account Number: [id number] Date of Birth/Sex: 03-25-1926 (83 y.o. Female) Treating RN: Montey Hora Primary Care Ilias Stcharles: Lucianne Lei Other Clinician: Referring Sya Nestler: Lucianne Lei Treating Deon Ivey/Extender: Frann Rider in Treatment: 22 Wound Status Wound Number: 1 Primary Etiology: Diabetic Wound/Ulcer of the Lower Extremity Wound Location: Left Lower Leg - Anterior Secondary Trauma, Other Wounding Event: Trauma Etiology: Date Acquired: 05/02/2017 Wound Status: Open Weeks Of Treatment: 22 Comorbid Anemia, Hypertension,  Type II Diabetes, Clustered Wound: No History: Osteoarthritis Photos Photo Uploaded By: Montey Hora on 1May 17, 202018 16:01:07 Wound Measurements Length: (cm) 7.6 Width: (cm) 3.7 Depth: (cm) 0.2 Area: (cm) 22.085 Volume: (cm) 4.417 % Reduction in Area: -359.4% % Reduction in Volume: -359.6% Epithelialization: None Tunneling: No Undermining: No Wound Description Classification: Grade 2 Wound Margin: Flat and Intact Exudate Amount: Large Exudate Type: Serous Exudate Color: amber Foul Odor After Cleansing: No Slough/Fibrino Yes Wound Bed Granulation Amount: None Present (0%) Exposed Structure Necrotic Amount: Large (67-100%) Fascia Exposed: Yes Necrotic Quality: Adherent Slough Fat Layer (Subcutaneous Tissue) Exposed: Yes Tendon Exposed: No Muscle Exposed: No Joint Exposed: No Bone Exposed: Yes Periwound Skin Texture [place], [name] ([id number]) Texture Color No Abnormalities Noted: No No Abnormalities Noted: No Callus: No Atrophie Blanche: No Crepitus: No Cyanosis: No Excoriation: No Ecchymosis: No Induration: No Erythema: Yes Rash: No Erythema Location: Circumferential Scarring: Yes Hemosiderin Staining: Yes Mottled: No Moisture Pallor: No No Abnormalities Noted: No Rubor: No Dry / Scaly: No Maceration: Yes Temperature / Pain Temperature: No Abnormality Tenderness on Palpation: Yes Wound Preparation Ulcer Cleansing: Rinsed/Irrigated with Saline Topical Anesthetic Applied: Other: lidocaine 4%, Treatment Notes Wound #1 (Left, Anterior Lower Leg) 1. Cleansed with: Clean wound with Normal Saline 2. Anesthetic Topical Lidocaine 4% cream to wound bed prior to debridement 4. Dressing Applied: Santyl Ointment 5. Secondary Dressing Applied Guaze, ABD and kerlix/Conform 7. Secured with Tape Notes kerlix wrap and netting Electronic Signature(s) Signed: 11/04/2017 4:53:14 PM By: Montey Hora Entered By: Montey Hora on 1May 17, 202018  13:32:39 [name], [name] ([id number]) -------------------------------------------------------------------------------- Wound Assessment Details Patient Name: Alexa Lowes P. Date of Service: 11/04/2017 1:30 PM Medical Record Number: [id number] Patient Account Number: [id number] Date of Birth/Sex: 12-15-1925 (83 y.o. Female) Treating RN: Montey Hora Primary Care Nemiah Kissner: Lucianne Lei Other Clinician: Referring Tarron Krolak: Lucianne Lei Treating Linzee Depaul/Extender: Frann Rider in Treatment: 22 Wound Status Wound Number: 2 Primary Arterial Insufficiency Ulcer Etiology: Wound Location: Left Toe Great Wound Status: Open Wounding Event: Trauma Comorbid Anemia, Hypertension, Type II  Diabetes, Date Acquired: 10/28/2017 History: Osteoarthritis Weeks Of Treatment: 1 Clustered Wound: No Photos Photo Uploaded By: Montey Hora on 1May 26, 202018 16:01:08 Wound Measurements Length: (cm) 0.4 Width: (cm) 0.6 Depth: (cm) 0.1 Area: (cm) 0.188 Volume: (cm) 0.019 % Reduction in Area: 14.5% % Reduction in Volume: 13.6% Epithelialization: None Tunneling: No Undermining: No Wound Description Full Thickness Without Exposed Support Classification: Structures Wound Margin: Flat and Intact Exudate Medium Amount: Exudate Type: Serous Exudate Color: amber Foul Odor After Cleansing: No Slough/Fibrino Yes Wound Bed Granulation Amount: None Present (0%) Exposed Structure Necrotic Amount: Large (67-100%) Fascia Exposed: No Necrotic Quality: Eschar, Adherent Slough Fat Layer (Subcutaneous Tissue) Exposed: No Tendon Exposed: No Muscle Exposed: No Joint Exposed: No Bone Exposed: No Barthel, [name] ([id number]) Periwound Skin Texture Texture Color No Abnormalities Noted: No No Abnormalities Noted: No Callus: No Atrophie Blanche: No Crepitus: No Cyanosis: No Excoriation: No Ecchymosis: No Induration: No Erythema: No Rash: No Hemosiderin Staining: No Scarring:  No Mottled: No Pallor: No Moisture Rubor: No No Abnormalities Noted: No Dry / Scaly: No Temperature / Pain Maceration: No Temperature: No Abnormality Wound Preparation Ulcer Cleansing: Rinsed/Irrigated with Saline Topical Anesthetic Applied: Other: lidocaine 4%, Treatment Notes Wound #2 (Left Toe Great) 1. Cleansed with: Clean wound with Normal Saline 2. Anesthetic Topical Lidocaine 4% cream to wound bed prior to debridement 4. Dressing Applied: Foam Other dressing (specify in notes) Notes coverlett Electronic Signature(s) Signed: 11/04/2017 4:53:14 PM By: Montey Hora Entered By: Montey Hora on 1May 26, 202018 13:33:18 Hardie, HYQMVHQI P. ([id number]) -------------------------------------------------------------------------------- [place] Details Patient Name: Alexa Lowes P. Date of Service: 11/04/2017 1:30 PM Medical Record Number: [id number] Patient Account Number: [id number] Date of Birth/Sex: 1926/08/29 (83 y.o. Female) Treating RN: Montey Hora Primary Care Ketura Sirek: Lucianne Lei Other Clinician: Referring Tian Mcmurtrey: Lucianne Lei Treating Catori Panozzo/Extender: Frann Rider in Treatment: 22 Vital Signs Time Taken: 13:27 Temperature (F): 98.1 Height (in): 65 Pulse (bpm): 81 Weight (lbs): 131 Respiratory Rate (breaths/min): 16 Body Mass Index (BMI): 21.8 Blood Pressure (mmHg): 146/66 Reference Range: 80 - 120 mg / dl Electronic Signature(s) Signed: 11/04/2017 4:53:14 PM By: Montey Hora Entered By: Montey Hora on 1May 26, 202018 13:28:14

## 2017-11-06 NOTE — Progress Notes (Signed)
HAILLE, PARDI ([id number]) Visit Report for 11/04/2017 Chief Complaint Document Details Patient Name: [name], [name] Date of Service: 11/04/2017 1:30 PM Medical Record Number: [id number] Patient Account Number: [id number] Date of Birth/Sex: [birth year] (83 y.o. Female) Treating RN: Montey Hora Primary Care Provider: Lucianne Lei Other Clinician: Referring Provider: Lucianne Lei Treating Provider/Extender: Frann Rider in Treatment: 22 Information Obtained from: Patient Chief Complaint Patients presents for treatment of an open diabetic ulcer 2 the left lower anterior leg which she sustained an injury by blunt trauma Electronic Signature(s) Signed: 11/04/2017 1:53:56 PM By: Christin Fudge MD, FACS Entered By: Christin Fudge on 101-27-2018 13:53:56 Haydel, UVOZDGUY P. ([id number]) -------------------------------------------------------------------------------- HPI Details Patient Name: [name] P. Date of Service: 11/04/2017 1:30 PM Medical Record Number: [id number] Patient Account Number: [id number] Date of Birth/Sex: January 30, 1926 (83 y.o. Female) Treating RN: Montey Hora Primary Care Provider: Lucianne Lei Other Clinician: Referring Provider: Lucianne Lei Treating Provider/Extender: Frann Rider in Treatment: 22 History of Present Illness Location: left anterior shin Quality: Patient reports experiencing a dull pain to affected area(s). Severity: Patient states wound are getting worse. Duration: Patient has had the wound for < 3 weeks prior to presenting for treatment Timing: Pain in wound is Intermittent (comes and goes Context: The wound occurred when the patient blunt trauma against her left shin Modifying Factors: Other treatment(s) tried include:active Bactroban ointment locally Associated Signs and Symptoms: Patient reports having: occasional discharge from the wound HPI Description: 83 year old patient was seen in the ED about 10 days ago for a  history of abrasion to the left lower extremity while she was boarding a bus and scraped the anterior part of her left leg. She has been a diabetic for many years and has been taking treatment regularly. Her past medical history is also significant for anemia arthritis constipation and hypertension and is status post abdominal hysterectomy. She has never been a smoker. after her ER visit she was advised to apply Bactroban ointment to the wound twice a day and continue to monitor her blood glucose levels. her last hemoglobin A1c was done 3 years ago and was 6.6% 06/04/17 patient left anterior shin wound appears to be doing well although it is still somewhat dry despite the treatment with Medihoney. We have been using Kerlex following the Medihoney application and I believe this is just not retaining that much moisture. Nonetheless there is no evidence of infection. 06/11/17 on evaluation today patient appears to be doing a little bit worse in regard to her wound. The entirety of the wound is dry and unfortunately the Medihoney does not seem to be helping this. That is even with the dressing changes that I made last week to try to retain more moisture. She has not tried [employer] as of yet. She was also noncompressible when testing for ABIs. 07/01/2017 -- she had a arterial studies done and was seen by Dr. Lucky Cowboy. her noninvasive studies showed noncompressible vessels on the right but brisk waveforms and digital pressures of 71 on the right consistent with only mild arterial insufficienc. On the left her ABI was 0.47 and this may be falsely elevated due to calcification. No digital pressures were obtained on the left and this is consistent with severe arterial insufficiency. this critical limb threatening situation on the left, would make wound healing difficult and it represents a serious situation and he has recommended an angiography with possible revascularization. The patient desired to defer the  decision to she discuss with the family members. 07/09/17 on evaluation today  patient appears to be doing fairly well in regard to her left anterior lower extremity wound. She has been tolerating the dressing changes without complication we have been utilizing Santyl at this time. She tells me that she is having no significant pain compared to what she has had in the past. She has decided after discussing with family that she is going to go forward with the surgery with Dr. dew. This is to restore better blood flow to left lower extremity. With that it does appear that her lower extremity wound is making some progress albeit slow. No fevers, chills, nausea, or vomiting noted at this time. 07/16/17 on evaluation today patient's wound appears to be doing roughly about the same although some of the slough is clearing off. [name] she has her appointment with vascular next week on Thursday the 23rd. With that being said her wound does not appear to be hurting as badly as it has in the past which is good news. No fevers, chills, nausea, or vomiting noted at this time. 07/30/2017 -- she had surgery on 07/22/2017 --indications being nonhealing ulcer on the left leg with a noninvasive study showing marked reduction in ABI of less than 0.5 and no digital pressure on her left leg. she had a percutaneous transluminal angioplasty of the left common and external iliac arteries. She then had a stent placed to the left external iliac arteries and to the left common iliac artery. the left lower extremity showed occlusion of the common femoral artery and the origins of the Pry, [name] ([id number]) SFA and the profunda femoris artery. The flow distally was poor and there were multiple areas of high-grade stenosis or occlusion of the distal SFA and the popliteal artery with only a diseased peroneal artery as runoff distally. She has a postop appointment on September 23 08/20/2017 -- she went back to the vascular  office for an appointment on 08/12/2017 and bilateral ABIs were done which were notable for moderate right lower extremity arterial disease and unable to obtain left ankle brachial indices due to absent Doppler velocities in the posterior tibial artery and the anterior tibial artery. Monophasic waveforms to the left femoral and popliteal artery. this was worrisome for a failure of stents placed and lower extremity angiogram was recommended. as was done on 08/16/2017 and the stents placed in the left eye Lex system had some mild narrowing at the iliac bifurcation and the leading edge of the stent proximally was poor wall apposition with stenosis in the 50-60% range in the right common iliac artery. There was also occlusion of the common femoral artery and the origin of the SFA and profunda femoris artery. Flow distally was poor but they appeared to be multiple areas of high-grade stenosis or occlusion in the distal SFA and popliteal artery with only a diseased peroneal artery. in another stent was placed and deployed. The femoral occlusion would need to be treated surgically. 08/27/17 on evaluation today patient appears to be doing about the same in regard to her left for extremity wound. Unfortunately secondary to her vascular status this is not a very well healing wound at this time. She has been tolerating the dressing changes she has some discomfort but mainly with cleansing of the wound not at other times. No fevers, chills, nausea, or vomiting noted at this time. 09/17/2017 -- the patient was recently discharged from the hospital where she was admitted between 09/08/2017 and 09/10/2017, when she had a procedure on her left femoral artery with the  endarterectomy and a patch. she will be going back for review this coming week. 09/24/2017 -- was seen on 09/18/2017 in the ER, for bleeding from her wound which was debrided earlier during the day. At the time she was seen there was no active  bleeding. A thrombin pad was placed over the area and she was asked to change the outside dressing but come back to the wound center as planned. she was also seen at the [facility] office for a review after her surgery and bilateral lower extremity ABI when compared to the previous ones showed minimal improvement of arterial blood flow. She was set up for a CT angiogram of the abdomen and pelvis and lower extremities to see the degree of peripheral arterial disease 10/28/2017 -- had a x-ray of the left tibia and fibula done on 10/07/2017 -- showed no bony acute abnormality she was reviewed by Dr. Corene Cornea dew on 10/26/2017, and he reviewed her CT angiogram and this demonstrated a widely patent left femoral endarterectomy including the origins of the femoral artery. She also had moderate distal disease in the popliteal artery, distal SFA and tibial disease. Her peroneal artery is a dominant runoff distally.he felt she had adequate perfusion for healing the mid calf level and he is putting off all surgical intervention for now and is going to see her back in 2-3 months for noninvasive studies. She also has a new wound on the plantar aspect of the left big toe where she pulled a piece of skin and has caused a superficial ulceration Electronic Signature(s) Signed: 11/04/2017 1:54:04 PM By: Christin Fudge MD, FACS Entered By: Christin Fudge on 108-19-202018 13:54:03 Vallez, [name] ([id number]) -------------------------------------------------------------------------------- Physical Exam Details Patient Name: [name] P. Date of Service: 11/04/2017 1:30 PM Medical Record Number: [id number] Patient Account Number: [id number] Date of Birth/Sex: 1926/04/13 (83 y.o. Female) Treating RN: Montey Hora Primary Care Provider: Lucianne Lei Other Clinician: Referring Provider: Lucianne Lei Treating Provider/Extender: Frann Rider in Treatment: 22 Constitutional . Pulse  regular. Respirations normal and unlabored. Afebrile. . Eyes Nonicteric. Reactive to light. Ears, Nose, Mouth, and Throat Lips, teeth, and gums WNL.Marland Kitchen Moist mucosa without lesions. Neck supple and nontender. No palpable supraclavicular or cervical adenopathy. Normal sized without goiter. Respiratory WNL. No retractions.. Cardiovascular Pedal Pulses WNL. No clubbing, cyanosis or edema. Lymphatic No adneopathy. No adenopathy. No adenopathy. Musculoskeletal Adexa without tenderness or enlargement.. Digits and nails w/o clubbing, cyanosis, infection, petechiae, ischemia, or inflammatory conditions.. Integumentary (Hair, Skin) No suspicious lesions. No crepitus or fluctuance. No peri-wound warmth or erythema. No masses.Marland Kitchen Psychiatric Judgement and insight Intact.. No evidence of depression, anxiety, or agitation.. Notes no sharp debridement was required today and the bone is still very much palpable along the mid third of the wound. Electronic Signature(s) Signed: 11/04/2017 1:54:58 PM By: Christin Fudge MD, FACS Entered By: Christin Fudge on 108-19-202018 13:54:58 [name] ([id number]) -------------------------------------------------------------------------------- Physician Orders Details Patient Name: [name] P. Date of Service: 11/04/2017 1:30 PM Medical Record Number: [id number] Patient Account Number: [id number] Date of Birth/Sex: 08-05-1926 (83 y.o. Female) Treating RN: Montey Hora Primary Care Provider: Lucianne Lei Other Clinician: Referring Provider: Lucianne Lei Treating Provider/Extender: Frann Rider in Treatment: 22 Verbal / Phone Orders: No Diagnosis Coding Wound Cleansing Wound #1 Left,Anterior Lower Leg o Clean wound with Normal Saline. Wound #2 Left Toe Great o Clean wound with Normal Saline. Anesthetic Wound #1 Left,Anterior Lower Leg o Topical Lidocaine 4% cream applied to wound bed prior to  debridement Wound #2 Left Toe  Great o Topical Lidocaine 4% cream applied to wound bed prior to debridement Primary Wound Dressing Wound #1 Left,Anterior Lower Leg o Santyl Ointment Wound #2 Left Toe Great o Foam Secondary Dressing o ABD pad o Other - telfa, ABD pad, kerlix wrap and secure with tape Wound #2 Left Toe Great o Other - coverlett Dressing Change Frequency Wound #1 Left,Anterior Lower Leg o Change dressing every day. Wound #2 Left Toe Great o Change dressing every day. Follow-up Appointments Wound #1 Left,Anterior Lower Leg o Return Appointment in 1 week. Wound #2 Left Toe Great o Return Appointment in 1 week. ELISE, KNOBLOCH ([id number]) Notes NPWT authorization Electronic Signature(s) Signed: 11/04/2017 4:19:51 PM By: Christin Fudge MD, FACS Signed: 11/04/2017 4:53:14 PM By: Montey Hora Entered By: Montey Hora on 1November 02, 202018 13:42:34 Shimer, [name] ([id number]) -------------------------------------------------------------------------------- Problem List Details Patient Name: [name] P. Date of Service: 11/04/2017 1:30 PM Medical Record Number: [id number] Patient Account Number: [id number] Date of Birth/Sex: [birth year] (83 y.o. 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  ([id number]) -------------------------------------------------------------------------------- Progress Note Details Patient Name: [name] P. Date of Service: 11/04/2017 1:30 PM Medical Record Number: [id number] Patient Account Number: [id number] Date of Birth/Sex: 1926-11-25 (83 y.o. Female) Treating RN: Montey Hora Primary Care Provider: Lucianne Lei Other Clinician: Referring Provider: Lucianne Lei Treating Provider/Extender: Frann Rider in Treatment: 22 Subjective Chief Complaint Information obtained from Patient Patients presents for treatment of an open diabetic ulcer 2 the left lower anterior leg which she sustained an injury by blunt trauma History of Present Illness (HPI) The following HPI elements were documented for the patient's wound: Location: left anterior shin Quality: Patient reports experiencing a dull pain to affected area(s). Severity: Patient states wound are getting worse. Duration: Patient has had the wound for < 3 weeks prior to presenting for treatment Timing: Pain in wound is Intermittent (comes and goes Context: The wound occurred when the patient blunt trauma against her left shin Modifying Factors: Other treatment(s) tried include:active Bactroban ointment locally Associated Signs and Symptoms: Patient reports having: occasional discharge from the wound 83 year old patient was seen in the ED about 10 days ago for a history of abrasion to the left lower extremity while she was boarding a bus and scraped the anterior part of her left leg. She has been a diabetic for many years and has been taking treatment regularly. Her past medical history is also significant for anemia arthritis constipation and hypertension and is status post abdominal hysterectomy. She has never been a smoker. after her ER visit she was advised to apply Bactroban ointment to the wound twice a day and continue to monitor her blood glucose levels. her last hemoglobin A1c  was done 3 years ago and was 6.6% 06/04/17 patient left anterior shin wound appears to be doing well although it is still somewhat dry despite the treatment with Medihoney. We have been using Kerlex following the [place] application and I believe this is just  not retaining that much moisture. Nonetheless there is no evidence of infection. 06/11/17 on evaluation today patient appears to be doing a little bit worse in regard to her wound. The entirety of the wound is dry and unfortunately the Medihoney does not seem to be helping this. That is even with the dressing changes that I made last week to try to retain more moisture. She has not tried [employer] as of yet. She was also noncompressible when testing for ABIs. 07/01/2017 -- she had a arterial studies done and was seen by Dr. Lucky Cowboy. her noninvasive studies showed noncompressible vessels on the right but brisk waveforms and digital pressures of 71 on the right consistent with only mild arterial insufficienc. On the left her ABI was 0.47 and this may be falsely elevated due to calcification. No digital pressures were obtained on the left and this is consistent with severe arterial insufficiency. this critical limb threatening situation on the left, would make wound healing difficult and it represents a serious situation and he has recommended an angiography with possible revascularization. The patient desired to defer the decision to she discuss with the family members. 07/09/17 on evaluation today patient appears to be doing fairly well in regard to her left anterior lower extremity wound. She has been tolerating the dressing changes without complication we have been utilizing Santyl at this time. She tells me that she is having no significant pain compared to what she has had in the past. She has decided after discussing with family that she is going to go forward with the surgery with Dr. dew. This is to restore better blood flow to left lower  extremity. With that it does appear that her lower extremity wound is making some progress albeit slow. No fevers, chills, nausea, or vomiting noted at this time. SALVADOR, BIGBEE ([id number]) 07/16/17 on evaluation today patient's wound appears to be doing roughly about the same although some of the slough is clearing off. [name] she has her appointment with vascular next week on Thursday the 23rd. With that being said her wound does not appear to be hurting as badly as it has in the past which is good news. No fevers, chills, nausea, or vomiting noted at this time. 07/30/2017 -- she had surgery on 07/22/2017 --indications being nonhealing ulcer on the left leg with a noninvasive study showing marked reduction in ABI of less than 0.5 and no digital pressure on her left leg. she had a percutaneous transluminal angioplasty of the left common and external iliac arteries. She then had a stent placed to the left external iliac arteries and to the left common iliac artery. 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 The flow distally was poor and there were multiple areas of high-grade stenosis or occlusion of the distal SFA and the popliteal artery with only a diseased peroneal artery as runoff distally. She has a postop appointment on September 23 08/20/2017 -- she went back to the vascular office for an appointment on 08/12/2017 and bilateral ABIs were done which were notable for moderate right lower extremity arterial disease and unable to obtain left ankle brachial indices due to absent Doppler velocities in the posterior tibial artery and the anterior tibial artery. Monophasic waveforms to the left femoral and popliteal artery. this was worrisome for a failure of stents placed and lower extremity angiogram was recommended. as was done on 08/16/2017 and the stents placed in  the left eye Lex system had some mild  narrowing at the iliac bifurcation and the leading edge of the stent proximally was poor wall apposition with stenosis in the 50-60% range in the right common iliac artery. There was also occlusion of the common femoral artery and the origin of the SFA and profunda femoris artery. Flow distally was poor but they appeared to be multiple areas of high-grade stenosis or occlusion in the distal SFA and popliteal artery with only a diseased peroneal artery. in another stent was placed and deployed. The femoral occlusion would need to be treated surgically. 08/27/17 on evaluation today patient appears to be doing about the same in regard to her left for extremity wound. Unfortunately secondary to her vascular status this is not a very well healing wound at this time. She has been tolerating the dressing changes she has some discomfort but mainly with cleansing of the wound not at other times. No fevers, chills, nausea, or vomiting noted at this time. 09/17/2017 -- the patient was recently discharged from the hospital where she was admitted between 09/08/2017 and 09/10/2017, when she had a procedure on her left femoral artery with the endarterectomy and a patch. she will be going back for review this coming week. 09/24/2017 -- was seen on 09/18/2017 in the ER, for bleeding from her wound which was debrided earlier during the day. At the time she was seen there was no active bleeding. A thrombin pad was placed over the area and she was asked to change the outside dressing but come back to the wound center as planned. she was also seen at the [facility] office for a review after her surgery and bilateral lower extremity ABI when compared to the previous ones showed minimal improvement of arterial blood flow. She was set up for a CT angiogram of the abdomen and pelvis and lower extremities to see the degree of peripheral arterial disease 10/28/2017 -- had a x-ray of the left tibia and  fibula done on 10/07/2017 -- showed no bony acute abnormality she was reviewed by Dr. Corene Cornea dew on 10/26/2017, and he reviewed her CT angiogram and this demonstrated a widely patent left femoral endarterectomy including the origins of the femoral artery. She also had moderate distal disease in the popliteal artery, distal SFA and tibial disease. Her peroneal artery is a dominant runoff distally.he felt she had adequate perfusion for healing the mid calf level and he is putting off all surgical intervention for now and is going to see her back in 2-3 months for noninvasive studies. She also has a new wound on the plantar aspect of the left big toe where she pulled a piece of skin and has caused a superficial ulceration Patient History Information obtained from Patient. Social History Never smoker, Marital Status - Separated, Alcohol Use - Never, Drug Use - No History, Caffeine Use - Rarely. Byers, Demetrice P. ([id number]) Objective Constitutional Pulse regular. Respirations normal and unlabored. Afebrile. Vitals Time Taken: 1:27 PM, Height: 65 in, Weight: 131 lbs, BMI: 21.8, Temperature: 98.1 F, Pulse: 81 bpm, Respiratory Rate: 16 breaths/min, Blood Pressure: 146/66 mmHg. Eyes Nonicteric. Reactive to light. Ears, Nose, Mouth, and Throat Lips, teeth, and gums WNL.Marland Kitchen Moist mucosa without lesions. Neck supple and nontender. No palpable supraclavicular or cervical adenopathy. Normal sized without goiter. Respiratory WNL. No retractions.. Cardiovascular Pedal Pulses WNL. No clubbing, cyanosis or edema. Lymphatic No adneopathy. No adenopathy. No adenopathy. Musculoskeletal Adexa without tenderness or enlargement.. Digits and nails w/o  clubbing, cyanosis, infection, petechiae, ischemia, or inflammatory conditions.Marland Kitchen Psychiatric Judgement and insight Intact.. No evidence of depression, anxiety, or agitation.. General Notes: no sharp debridement was required today and the bone is still very  much palpable along the mid third of the wound. Integumentary (Hair, Skin) No suspicious lesions. No crepitus or fluctuance. No peri-wound warmth or erythema. No masses.. Wound #1 status is Open. Original cause of wound was Trauma. The wound is located on the Left,Anterior Lower Leg. The wound measures 7.6cm length x 3.7cm width x 0.2cm depth; 22.085cm^2 area and 4.417cm^3 volume. There is bone, Fat Layer (Subcutaneous Tissue) Exposed, and fascia exposed. There is no tunneling or undermining noted. There is a large amount of serous drainage noted. The wound margin is flat and intact. There is no granulation within the wound bed. There is a large (67-100%) amount of necrotic tissue within the wound bed including Adherent Slough. The periwound skin appearance exhibited: Scarring, Maceration, Hemosiderin Staining, Erythema. The periwound skin appearance did not exhibit: Callus, Crepitus, Excoriation, Induration, Rash, Dry/Scaly, Atrophie Blanche, Cyanosis, Ecchymosis, Mottled, Pallor, Rubor. The surrounding wound skin color is noted with erythema which is circumferential. Periwound temperature was noted as No Abnormality. The periwound has tenderness on palpation. Bajaj, Latronda P. ([id number]) Wound #2 status is Open. Original cause of wound was Trauma. The wound is located on the Left Toe Great. The wound measures 0.4cm length x 0.6cm width x 0.1cm depth; 0.188cm^2 area and 0.019cm^3 volume. There is no tunneling or undermining noted. There is a medium amount of serous drainage noted. The wound margin is flat and intact. There is no granulation within the wound bed. There is a large (67-100%) amount of necrotic tissue within the wound bed including Eschar and Adherent Slough. The periwound skin appearance did not exhibit: Callus, Crepitus, Excoriation, Induration, Rash, Scarring, Dry/Scaly, Maceration, Atrophie Blanche, Cyanosis, Ecchymosis, Hemosiderin Staining, Mottled, Pallor,  Rubor, Erythema. Periwound temperature was noted as No Abnormality. 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 Wound #2 Left Toe Great: Clean wound with Normal Saline. Anesthetic: Wound #1 Left,Anterior Lower Leg: Topical Lidocaine 4% cream applied to wound bed prior to debridement Wound #2 Left Toe Great: Topical Lidocaine 4% cream applied to wound bed prior to debridement Primary Wound Dressing: Wound #1 Left,Anterior Lower Leg: Santyl Ointment Wound #2 Left Toe Great: Foam Secondary Dressing: ABD pad Other - telfa, ABD pad, kerlix wrap and secure with tape Wound #2 Left Toe Great: Other - coverlett Dressing Change Frequency: Wound #1 Left,Anterior Lower Leg: Change dressing every day. Wound #2 Left Toe Great: Change dressing every day. Follow-up Appointments: Wound #1 Left,Anterior Lower Leg: Return Appointment in 1 week. Wound #2 Left Toe Great: Return Appointment in 1 week. Willow, [name] ([id number]) General Notes: NPWT authorization After review today, I have recommended: 1. Daily washing with soap and water and application of Santyl ointment locally with a light Kerlix dressing. 2. she will benefit from the application of a wound VAC to help granulate the wound, and we will try and get this preauthorized 3. good control of her diabetes mellitus 4. Adequate protein, vitamin A, vitamin C and zinc 5. Regular visits the wound center Electronic Signature(s) Signed: 11/04/2017 1:55:52 PM By: Christin Fudge MD, FACS Entered By:  Tramane Gorum on 127-Dec-202018 13:55:52 Fontanez, Reyanna PMarland Kitchen  ([id number]) -------------------------------------------------------------------------------- ROS/PFSH Details Patient Name: YAMILA, CRAGIN P. Date of Service: 11/04/2017 1:30 PM Medical Record Number: [id number] Patient Account Number: [id number] Date of Birth/Sex: 05-Jul-1926 (83 y.o. Female) Treating RN: Montey Hora Primary Care Provider: Lucianne Lei Other Clinician: Referring Provider: Lucianne Lei Treating Provider/Extender: Frann Rider in Treatment: 22 Information Obtained From Patient Wound History Do you currently have one or more open woundso Yes How many open wounds do you currently haveo 1 Approximately how long have you had your woundso 3 weeks How have you been treating your wound(s) until nowo open to air Has your wound(s) ever healed and then re-openedo No Have you had any lab work done in the past montho No Have you tested positive for an antibiotic resistant organism (MRSA, VRE)o No Have you tested positive for osteomyelitis (bone infection)o No Have you had any tests for circulation on your legso No Hematologic/Lymphatic Medical History: Positive for: Anemia Cardiovascular Medical History: Positive for: Hypertension Endocrine Medical History: Positive for: Type II Diabetes Treated with: Oral agents Musculoskeletal Medical History: Positive for: Osteoarthritis Oncologic Medical History: Negative for: Received Chemotherapy; Received Radiation Immunizations Pneumococcal Vaccine: Received Pneumococcal Vaccination: Yes Immunization Notes: up to date Implantable Devices Family and Social History SAHIRAH, RUDELL ([id number]) Never smoker; Marital Status - Separated; Alcohol Use: Never; Drug Use: No History; Caffeine Use: Rarely; Financial Concerns: No; Food, Clothing or Shelter Needs: No; Support System Lacking: No; Transportation Concerns: No; Advanced Directives: No; Patient does not want information on Advanced Directives Physician  Affirmation I have reviewed and agree with the above information. Electronic Signature(s) Signed: 11/04/2017 4:19:51 PM By: Christin Fudge MD, FACS Signed: 11/04/2017 4:53:14 PM By: Montey Hora Entered By: Christin Fudge on 127-Dec-202018 13:54:10 [place], [name] ([id number]) -------------------------------------------------------------------------------- SuperBill Details Patient Name: [name] P. Date of Service: 11/04/2017 Medical Record Number: [id number] Patient Account Number: [id number] Date of Birth/Sex: Feb 20, 1926 (83 y.o. 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 Facility Procedures CPT4 Code: [id number] Description: 99213 - WOUND CARE VISIT-LEV 3 EST PT Modifier: Quantity: 1 Physician Procedures CPT4 Code: 8338250 Description: 53976 - WC PHYS LEVEL 3 - EST PT ICD-10 Diagnosis Description E11.622 Type 2 diabetes mellitus with other skin ulcer 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.242 Atherosclerosis of native arteries of left  leg with ulceration Modifier: of calf Quantity: 1 Electronic Signature(s) Signed: 11/04/2017 2:32:02 PM By: Montey Hora Signed: 11/04/2017 4:19:51 PM By: Christin Fudge MD, FACS Previous Signature: 11/04/2017 1:56:03 PM Version By: Christin Fudge MD, FACS Entered By: Montey Hora on 127-Dec-202018 14:32:02

## 2017-11-11 ENCOUNTER — Ambulatory Visit: Payer: Medicare Other | Admitting: Surgery

## 2017-11-19 ENCOUNTER — Encounter: Payer: Medicare Other | Admitting: Physician Assistant

## 2017-11-19 DIAGNOSIS — E11622 Type 2 diabetes mellitus with other skin ulcer: Secondary | ICD-10-CM | POA: Diagnosis not present

## 2017-11-21 NOTE — Progress Notes (Signed)
FONTAINE, KOSSMAN ([id number]) Visit Report for 11/19/2017 Arrival Information Details Patient Name: [name], [name] Date of Service: 11/19/2017 3:15 PM Medical Record Number: [id number] Patient Account Number: [id number] Date of Birth/Sex: 1926/03/11 (83 y.o. Female) Treating RN: Montey Hora Primary Care Curry Seefeldt: Lucianne Lei Other Clinician: Referring Bryan Goin: Lucianne Lei Treating Millan Legan/Extender: Melburn Hake, HOYT Weeks in Treatment: 25 Visit Information History Since Last Visit Added or deleted any medications: No Patient Arrived: Walker Any new allergies or adverse reactions: No Arrival Time: 15:27 Had a fall or experienced change in No Accompanied By: dtr activities of daily living that may affect Transfer Assistance: None risk of falls: Patient Identification Verified: Yes Signs or symptoms of abuse/neglect since last visito No Secondary Verification Process Completed: Yes Hospitalized since last visit: No Patient Requires Transmission-Based Precautions: No Has Dressing in Place as Prescribed: Yes Patient Has Alerts: Yes Pain Present Now: No Electronic Signature(s) Signed: 11/19/2017 4:38:31 PM By: Montey Hora Entered By: Montey Hora on 11/19/2017 15:29:33 [name], [name] ([id number]) -------------------------------------------------------------------------------- Clinic Level of Care Assessment Details Patient Name: Alexa Lowes P. Date of Service: 11/19/2017 3:15 PM Medical Record Number: [id number] Patient Account Number: [id number] Date of Birth/Sex: March 15, 1926 (83 y.o. Female) Treating RN: Montey Hora Primary Care Ayari Liwanag: Lucianne Lei Other Clinician: Referring Tudor Chandley: Lucianne Lei Treating Carlina Derks/Extender: Melburn Hake, HOYT Weeks in Treatment: 25 Clinic Level of Care Assessment Items TOOL 4 Quantity Score []  - Use when only an EandM is performed on FOLLOW-UP visit 0 ASSESSMENTS - Nursing Assessment / Reassessment X -  Reassessment of Co-morbidities (includes updates in patient status) 1 10 X- 1 5 Reassessment of Adherence to Treatment Plan ASSESSMENTS - Wound and Skin Assessment / Reassessment []  - Simple Wound Assessment / Reassessment - one wound 0 X- 2 5 Complex Wound Assessment / Reassessment - multiple wounds []  - 0 Dermatologic / Skin Assessment (not related to wound area) ASSESSMENTS - Focused Assessment []  - Circumferential Edema Measurements - multi extremities 0 []  - 0 Nutritional Assessment / Counseling / Intervention X- 1 5 Lower Extremity Assessment (monofilament, tuning fork, pulses) []  - 0 Peripheral Arterial Disease Assessment (using hand held doppler) ASSESSMENTS - Ostomy and/or Continence Assessment and Care []  - Incontinence Assessment and Management 0 []  - 0 Ostomy Care Assessment and Management (repouching, etc.) PROCESS - Coordination of Care X - Simple Patient / Family Education for ongoing care 1 15 []  - 0 Complex (extensive) Patient / Family Education for ongoing care []  - 0 Staff obtains Programmer, systems, Records, Test Results / Process Orders []  - 0 Staff telephones HHA, Nursing Homes / Clarify orders / etc []  - 0 Routine Transfer to another Facility (non-emergent condition) []  - 0 Routine Hospital Admission (non-emergent condition) []  - 0 New Admissions / Biomedical engineer / Ordering NPWT, Apligraf, etc. []  - 0 Emergency Hospital Admission (emergent condition) X- 1 10 Simple Discharge Coordination [name], [name] ([id number]) []  - 0 Complex (extensive) Discharge Coordination PROCESS - Special Needs []  - Pediatric / Minor Patient Management 0 []  - 0 Isolation Patient Management []  - 0 Hearing / Language / Visual special needs []  - 0 Assessment of Community assistance (transportation, D/C planning, etc.) []  - 0 Additional assistance / Altered mentation []  - 0 Support Surface(s) Assessment (bed, cushion, seat, etc.) INTERVENTIONS - Wound Cleansing /  Measurement []  - Simple Wound Cleansing - one wound 0 X- 2 5 Complex Wound Cleansing - multiple wounds X- 1 5 Wound Imaging (photographs - any number of wounds) []  - 0 Wound Tracing (instead  of photographs) []  - 0 Simple Wound Measurement - one wound X- 2 5 Complex Wound Measurement - multiple wounds INTERVENTIONS - Wound Dressings X - Small Wound Dressing one or multiple wounds 2 10 []  - 0 Medium Wound Dressing one or multiple wounds []  - 0 Large Wound Dressing one or multiple wounds []  - 0 Application of Medications - topical []  - 0 Application of Medications - injection INTERVENTIONS - Miscellaneous []  - External ear exam 0 []  - 0 Specimen Collection (cultures, biopsies, blood, body fluids, etc.) []  - 0 Specimen(s) / Culture(s) sent or taken to Lab for analysis []  - 0 Patient Transfer (multiple staff / Civil Service fast streamer / Similar devices) []  - 0 Simple Staple / Suture removal (25 or less) []  - 0 Complex Staple / Suture removal (26 or more) []  - 0 Hypo / Hyperglycemic Management (close monitor of Blood Glucose) []  - 0 Ankle / Brachial Index (ABI) - do not check if billed separately X- 1 5 Vital Signs [name], [name] ([id number]) Has the patient been seen at the hospital within the last three years: Yes Total Score: 105 Level Of Care: New/Established - Level 3 Electronic Signature(s) Signed: 11/19/2017 4:38:31 PM By: Montey Hora Entered By: Montey Hora on 11/19/2017 16:23:43 [name], Alexa C. ([id number]) -------------------------------------------------------------------------------- Encounter Discharge Information Details Patient Name: Alexa Lowes P. Date of Service: 11/19/2017 3:15 PM Medical Record Number: [id number] Patient Account Number: [id number] Date of Birth/Sex: 1926/07/28 (83 y.o. Female) Treating RN: Carolyne Fiscal, Debi Primary Care Sakshi Sermons: Lucianne Lei Other Clinician: Referring Natalie Leclaire: Lucianne Lei Treating Aikeem Lilley/Extender: Melburn Hake, HOYT Weeks in Treatment: 25 Encounter Discharge Information Items Discharge Pain Level: 0 Discharge Condition: Stable Ambulatory Status: Walker Discharge Destination: Home Transportation: Private Auto Accompanied By: dtr Schedule Follow-up Appointment: Yes Medication Reconciliation completed and No provided to Patient/Care Leilene Diprima: Provided on Clinical Summary of Care: 11/19/2017 Form Type Recipient Paper Patient GW Electronic Signature(s) Signed: 11/19/2017 4:25:09 PM By: Montey Hora Entered By: Montey Hora on 11/19/2017 16:25:09 [name], Alexa W. ([id number]) -------------------------------------------------------------------------------- Lower Extremity Assessment Details Patient Name: Alexa Lowes P. Date of Service: 11/19/2017 3:15 PM Medical Record Number: [id number] Patient Account Number: [id number] Date of Birth/Sex: [birth year] (83 y.o. Female) Treating RN: Montey Hora Primary Care Olvin Rohr: Lucianne Lei Other Clinician: Referring Cordelia Bessinger: Lucianne Lei Treating Raeanne Deschler/Extender: Melburn Hake, HOYT Weeks in Treatment: 25 Vascular Assessment Pulses: Dorsalis Pedis Palpable: [Left:Yes] Posterior Tibial Extremity colors, hair growth, and conditions: Extremity Color: [Left:Hyperpigmented] Hair Growth on Extremity: [Left:No] Temperature of Extremity: [Left:Warm] Capillary Refill: [Left:< 3 seconds] Electronic Signature(s) Signed: 11/19/2017 4:38:31 PM By: Montey Hora Entered By: Montey Hora on 11/19/2017 15:36:28 [name], [name] ([id number]) -------------------------------------------------------------------------------- Multi Wound Chart Details Patient Name: Alexa Lowes P. Date of Service: 11/19/2017 3:15 PM Medical Record Number: [id number] Patient Account Number: [id number] Date of Birth/Sex: 1925/12/28 (83 y.o. 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] [N/A:N/A] Secondary Etiology: [1:Trauma, Other] [2:N/A] [N/A:N/A] Comorbid History: [1:Anemia, Hypertension, Type II Diabetes, Osteoarthritis] [2:Anemia, Hypertension, Type II Diabetes, Osteoarthritis] [N/A:N/A] Date Acquired: [1:05/02/2017] [2:10/28/2017] [N/A:N/A] Weeks of Treatment: [1:25] [2:3] [N/A:N/A] Wound Status: [1:Open] [2:Open] [N/A:N/A] Measurements  L x W x D [1:7.4x3.6x0.2] [2:0.6x0.6x0.1] [N/A:N/A] (cm) Area (cm) : [1:20.923] [2:0.283] [N/A:N/A] Volume (cm) : [1:4.185] [2:0.028] [N/A:N/A] % Reduction in Area: [1:-335.30%] [2:-28.60%] [N/A:N/A] % Reduction in Volume: [1:-335.50%] [2:-27.30%] [N/A:N/A] Classification: [1:Grade 2] [2:Full Thickness Without Exposed Support Structures] [N/A:N/A] Exudate Amount: [1:Large] [2:Medium] [N/A:N/A] Exudate Type: [1:Serous] [2:Serous] [N/A:N/A] Exudate Color: [1:amber] [2:amber] [N/A:N/A] Wound Margin: [1:Flat and Intact] [2:Flat and Intact] [N/A:N/A] Granulation Amount: [1:Medium (34-66%)] [2:None Present (0%)] [N/A:N/A] Granulation Quality: [1:Red] [2:N/A] [N/A:N/A] Necrotic Amount: [1:Medium (34-66%)] [2:Large (67-100%)] [N/A:N/A] Necrotic Tissue: [1:Adherent Slough] [2:Eschar, Adherent Slough] [N/A:N/A] Exposed Structures: [1:Fascia: Yes Fat Layer (Subcutaneous Tissue) Exposed: Yes Bone: Yes Tendon: No Muscle: No Joint: No] [2:Fascia: No Fat Layer (Subcutaneous Tissue) Exposed: No Tendon: No Muscle: No Joint: No  Bone: No] [N/A:N/A] Epithelialization: [1:None] [2:None] [N/A:N/A] Periwound Skin Texture: [1:Scarring: Yes Excoriation: No] [2:Excoriation: No Induration: No] [N/A:N/A] Induration: No Callus: No Callus: No Crepitus: No Crepitus: No Rash: No Rash: No Scarring: No Periwound Skin Moisture: Maceration: Yes Maceration: No N/A Dry/Scaly: No Dry/Scaly: No Periwound Skin Color: Erythema: Yes Atrophie Blanche: No N/A Hemosiderin Staining: Yes Cyanosis: No Atrophie Blanche: No Ecchymosis: No Cyanosis: No Erythema: No Ecchymosis: No Hemosiderin Staining: No Mottled: No Mottled: No Pallor: No Pallor: No Rubor: No Rubor: No Erythema Location: Circumferential N/A N/A Temperature: No Abnormality No Abnormality N/A Tenderness on Palpation: Yes No N/A Wound Preparation: Ulcer Cleansing: Ulcer Cleansing: N/A Rinsed/Irrigated with Saline Rinsed/Irrigated with Saline Topical Anesthetic Applied: Topical Anesthetic Applied: Other: lidocaine 4% Other: lidocaine 4% Treatment Notes Electronic Signature(s) Signed: 11/19/2017 4:38:31 PM By: Montey Hora Entered By: Montey Hora on 11/19/2017 15:48:34 [name], [name] ([id number]) -------------------------------------------------------------------------------- Imogene Details Patient Name: Alexa Lowes P. Date of Service: 11/19/2017 3:15 PM Medical Record Number: [id number] Patient Account Number: [id number] Date of Birth/Sex: [birth year] (83 y.o. Female) Treating RN: Montey Hora Primary Care Skylar Flynt: Lucianne Lei Other Clinician: Referring Quinnie Barcelo: Lucianne Lei Treating Tinia Oravec/Extender: Melburn Hake, HOYT Weeks in Treatment: 25 Active Inactive ` Abuse / Safety / Falls / Self Care Management Nursing Diagnoses: Potential for falls Goals: Patient will remain injury free related to falls Date Initiated: 05/28/2017 Target Resolution Date: 07/23/2017 Goal Status: Active Interventions: Assess fall  risk on admission and as needed Notes: ` Orientation to the Wound Care Program Nursing Diagnoses: Knowledge deficit related to the wound healing center program Goals: Patient/caregiver will verbalize understanding of the [facility] Date Initiated: 05/28/2017 Target Resolution Date: 07/23/2017 Goal Status: Active Interventions: Provide education on orientation to the wound center Notes: ` Wound/Skin Impairment Nursing Diagnoses: Impaired tissue integrity Goals: Ulcer/skin breakdown will have a volume reduction of 30% by week 4 Date Initiated: 05/28/2017 Target Resolution Date: 07/23/2017 Goal Status: Active Ulcer/skin breakdown will have a volume reduction of 50% by week 8 Date Initiated: 05/28/2017 Target Resolution Date: 07/23/2017 [name], [name] ([id number]) Goal Status: Active Ulcer/skin breakdown will have a volume reduction of 80% by week 12 Date Initiated: 05/28/2017 Target Resolution Date: 07/23/2017 Goal Status: Active Ulcer/skin breakdown will heal within 14 weeks Date Initiated: 05/28/2017 Target Resolution Date: 07/23/2017 Goal Status: Active Interventions: Assess patient/caregiver ability to obtain necessary supplies Assess patient/caregiver ability to perform ulcer/skin care regimen upon admission and as needed Assess ulceration(s) every visit Notes: Electronic Signature(s) Signed: 11/19/2017 4:38:31 PM By: Montey Hora Entered By: Montey Hora on 11/19/2017 15:48:27 [name], [name] ([id number]) -------------------------------------------------------------------------------- Pain Assessment Details Patient Name: Alexa Lowes P. Date of Service: 11/19/2017 3:15 PM Medical Record Number: [id number] Patient Account Number: [id number] Date of Birth/Sex:  [birth year] (83 y.o. Female) Treating RN: Montey Hora Primary Care Miyani Cronic: Lucianne Lei Other Clinician: Referring Arieana Somoza: Lucianne Lei Treating Demetrious Rainford/Extender: Melburn Hake,  HOYT Weeks in Treatment: 25 Active Problems Location of Pain Severity and Description of Pain Patient Has Paino No Site Locations Pain Management and Medication Current Pain Management: Electronic Signature(s) Signed: 11/19/2017 4:38:31 PM By: Montey Hora Entered By: Montey Hora on 11/19/2017 15:32:29 [name], [name] ([id number]) -------------------------------------------------------------------------------- Patient/Caregiver Education Details Patient Name: Alexa Lowes P. Date of Service: 11/19/2017 3:15 PM Medical Record Number: [id number] Patient Account Number: [id number] Date of Birth/Gender: February 17, 1926 (83 y.o. Female) Treating RN: Montey Hora Primary Care Physician: Lucianne Lei Other Clinician: Referring Physician: Lucianne Lei Treating Physician/Extender: Sharalyn Ink in Treatment: 25 Education Assessment Education Provided To: Patient and Caregiver Education Topics Provided Wound/Skin Impairment: Handouts: Other: wound care as ordered Methods: Demonstration, Explain/Verbal Responses: State content correctly Electronic Signature(s) Signed: 11/19/2017 4:38:31 PM By: Montey Hora Entered By: Montey Hora on 11/19/2017 16:26:04 Cullin, Clyde ([id number]) -------------------------------------------------------------------------------- Wound Assessment Details Patient Name: Alexa Lowes P. Date of Service: 11/19/2017 3:15 PM Medical Record Number: [id number] Patient Account Number: [id number] Date of Birth/Sex: 1926-02-15 (83 y.o. Female) Treating RN: Montey Hora Primary Care Vangie Henthorn: Lucianne Lei Other Clinician: Referring Sequoia Witz: Lucianne Lei Treating Ekam Besson/Extender: Melburn Hake, HOYT Weeks in Treatment: 25 Wound Status Wound Number: 1 Primary Etiology: Diabetic Wound/Ulcer of the Lower Extremity Wound Location: Left Lower Leg - Anterior Secondary Trauma, Other Wounding Event: Trauma Etiology: Date Acquired:  05/02/2017 Wound Status: Open Weeks Of Treatment: 25 Comorbid Anemia, Hypertension, Type II Diabetes, Clustered Wound: No History: Osteoarthritis Wound Measurements Length: (cm) 7.4 Width: (cm) 3.6 Depth: (cm) 0.2 Area: (cm) 20.923 Volume: (cm) 4.185 % Reduction in Area: -335.3% % Reduction in Volume: -335.5% Epithelialization: None Tunneling: No Undermining: No Wound Description Classification: Grade 2 Foul Odo Wound Margin: Flat and Intact Slough/F Exudate Amount: Large Exudate Type: Serous Exudate Color: amber r After Cleansing: No ibrino Yes Wound Bed Granulation Amount: Medium (34-66%) Exposed Structure Granulation Quality: Red Fascia Exposed: Yes Necrotic Amount: Medium (34-66%) Fat Layer (Subcutaneous Tissue) Exposed: Yes Necrotic Quality: Adherent Slough Tendon Exposed: No Muscle Exposed: No Joint Exposed: No Bone Exposed: Yes Periwound Skin Texture Texture Color No Abnormalities Noted: No No Abnormalities Noted: No Callus: No Atrophie Blanche: No Crepitus: No Cyanosis: No Excoriation: No Ecchymosis: No Induration: No Erythema: Yes Rash: No Erythema Location: Circumferential Scarring: Yes Hemosiderin Staining: Yes Mottled: No Moisture Pallor: No No Abnormalities Noted: No Rubor: No Dry / Scaly: No Maceration: Yes Temperature / Pain Nauert, Jenell P. ([id number]) Temperature: No Abnormality Tenderness on Palpation: Yes Wound Preparation Ulcer Cleansing: Rinsed/Irrigated with Saline Topical Anesthetic Applied: Other: lidocaine 4%, Treatment Notes Wound #1 (Left, Anterior Lower Leg) 1. Cleansed with: Clean wound with Normal Saline 2. Anesthetic Topical Lidocaine 4% cream to wound bed prior to debridement 4. Dressing Applied: Other dressing (specify in notes) 5. Secondary Dressing Applied Bordered Foam Dressing Dry Gauze Electronic Signature(s) Signed: 11/19/2017 4:38:31 PM By: Montey Hora Entered By: Montey Hora on  11/19/2017 15:35:58 Diel, [name] ([id number]) -------------------------------------------------------------------------------- Wound Assessment Details Patient Name: Alexa Lowes P. Date of Service: 11/19/2017 3:15 PM Medical Record Number: [id number] Patient Account Number: [id number] Date of Birth/Sex: 1926/11/24 (83 y.o. Female) Treating RN: Montey Hora Primary Care Tobie Hellen: Lucianne Lei Other Clinician: Referring Daine Gunther: Lucianne Lei Treating Delilah Mulgrew/Extender: STONE III, HOYT Weeks in Treatment: 25 Wound Status Wound Number: 2 Primary Arterial Insufficiency Ulcer Etiology: Wound Location: Left Toe Great Wound Status: Open Wounding Event:  Trauma Comorbid Anemia, Hypertension, Type II Diabetes, Date Acquired: 10/28/2017 History: Osteoarthritis Weeks Of Treatment: 3 Clustered Wound: No Wound Measurements Length: (cm) 0.6 Width: (cm) 0.6 Depth: (cm) 0.1 Area: (cm) 0.283 Volume: (cm) 0.028 % Reduction in Area: -28.6% % Reduction in Volume: -27.3% Epithelialization: None Tunneling: No Undermining: No Wound Description Full Thickness Without Exposed Support Classification: Structures Wound Margin: Flat and Intact Exudate Medium Amount: Exudate Type: Serous Exudate Color: amber Foul Odor After Cleansing: No Slough/Fibrino Yes Wound Bed Granulation Amount: None Present (0%) Exposed Structure Necrotic Amount: Large (67-100%) Fascia Exposed: No Necrotic Quality: Eschar, Adherent Slough Fat Layer (Subcutaneous Tissue) Exposed: No Tendon Exposed: No Muscle Exposed: No Joint Exposed: No Bone Exposed: No Periwound Skin Texture Texture Color No Abnormalities Noted: No No Abnormalities Noted: No Callus: No Atrophie Blanche: No Crepitus: No Cyanosis: No Excoriation: No Ecchymosis: No Induration: No Erythema: No Rash: No Hemosiderin Staining: No Scarring: No Mottled: No Pallor: No Moisture Rubor: No No Abnormalities Noted: No Dry /  Scaly: No Temperature / Pain Hawn, Makai P. ([id number]) Maceration: No Temperature: No Abnormality Wound Preparation Ulcer Cleansing: Rinsed/Irrigated with Saline Topical Anesthetic Applied: Other: lidocaine 4%, Treatment Notes Wound #2 (Left Toe Great) 1. Cleansed with: Clean wound with Normal Saline 2. Anesthetic Topical Lidocaine 4% cream to wound bed prior to debridement 4. Dressing Applied: Santyl Ointment 7. Secured with Other (specify in notes) Notes coverlett Electronic Signature(s) Signed: 11/19/2017 4:38:31 PM By: Montey Hora Entered By: Montey Hora on 11/19/2017 15:36:08 Giddings, [name] ([id number]) -------------------------------------------------------------------------------- Vitals Details Patient Name: Alexa Lowes P. Date of Service: 11/19/2017 3:15 PM Medical Record Number: [id number] Patient Account Number: [id number] Date of Birth/Sex: 09/23/1926 (83 y.o. Female) Treating RN: Montey Hora Primary Care Myka Hitz: Lucianne Lei Other Clinician: Referring Maleiah Dula: Lucianne Lei Treating Kerin Kren/Extender: Melburn Hake, HOYT Weeks in Treatment: 25 Vital Signs Time Taken: 15:32 Temperature (F): 98.2 Height (in): 65 Pulse (bpm): 83 Weight (lbs): 131 Respiratory Rate (breaths/min): 16 Body Mass Index (BMI): 21.8 Blood Pressure (mmHg): 148/63 Reference Range: 80 - 120 mg / dl Electronic Signature(s) Signed: 11/19/2017 4:38:31 PM By: Montey Hora Entered By: Montey Hora on 11/19/2017 15:32:50

## 2017-11-24 NOTE — Progress Notes (Signed)
[name] ([id number]) Visit Report for 11/19/2017 Chief Complaint Document Details Patient Name: [name], [name] Date of Service: 11/19/2017 3:15 PM Medical Record Number: [id number] Patient Account Number: [id number] Date of Birth/Sex: 05-Jun-1926 (83 y.o. Female) Treating RN: Carolyne Fiscal, Debi Primary Care Provider: Lucianne Lei Other Clinician: Referring Provider: Lucianne Lei Treating Provider/Extender: Melburn Hake, Thadeus Gandolfi Weeks in Treatment: 25 Information Obtained from: Patient Chief Complaint Patients presents for treatment of an open diabetic ulcer 2 the left lower anterior leg which she sustained an injury by blunt trauma Electronic Signature(s) Signed: 11/21/2017 1:59:17 AM By: Worthy Keeler PA-C Entered By: Worthy Keeler on 11/19/2017 17:21:09 [place], [place]. ([id number]) -------------------------------------------------------------------------------- HPI Details Patient Name: Alexa Lowes P. Date of Service: 11/19/2017 3:15 PM Medical Record Number: [id number] Patient Account Number: [id number] Date of Birth/Sex: 06-06-1926 (83 y.o. Female) Treating RN: Carolyne Fiscal, Debi Primary Care Provider: Lucianne Lei Other Clinician: Referring Provider: Lucianne Lei Treating Provider/Extender: Melburn Hake, Ernie Sagrero Weeks in Treatment: 25 History of Present Illness Location: left anterior shin Quality: Patient reports experiencing a dull pain to affected area(s). Severity: Patient states wound are getting worse. Duration: Patient has had the wound for < 3 weeks prior to presenting for treatment Timing: Pain in wound is Intermittent (comes and goes Context: The wound occurred when the patient blunt trauma against her left shin Modifying Factors: Other treatment(s) tried include:active Bactroban ointment locally Associated Signs and Symptoms: Patient reports having: occasional discharge from the wound HPI Description: 83 year old patient was seen in the ED about 10 days  ago for a history of abrasion to the left lower extremity while she was boarding a bus and scraped the anterior part of her left leg. She has been a diabetic for many years and has been taking treatment regularly. Her past medical history is also significant for anemia arthritis constipation and hypertension and is status post abdominal hysterectomy. She has never been a smoker. after her ER visit she was advised to apply Bactroban ointment to the wound twice a day and continue to monitor her blood glucose levels. her last hemoglobin A1c was done 3 years ago and was 6.6% 06/04/17 patient left anterior shin wound appears to be doing well although it is still somewhat dry despite the treatment with Medihoney. We have been using Kerlex following the Medihoney application and I believe this is just not retaining that much moisture. Nonetheless there is no evidence of infection. 06/11/17 on evaluation today patient appears to be doing a little bit worse in regard to her wound. The entirety of the wound is dry and unfortunately the Medihoney does not seem to be helping this. That is even with the dressing changes that I made last week to try to retain more moisture. She has not tried [employer] as of yet. She was also noncompressible when testing for ABIs. 07/01/2017 -- she had a arterial studies done and was seen by Dr. Lucky Cowboy. her noninvasive studies showed noncompressible vessels on the right but brisk waveforms and digital pressures of 71 on the right consistent with only mild arterial insufficienc. On the left her ABI was 0.47 and this may be falsely elevated due to calcification. No digital pressures were obtained on the left and this is consistent with severe arterial insufficiency. this critical limb threatening situation on the left, would make wound healing difficult and it represents a serious situation and he has recommended an angiography with possible revascularization. The patient desired to defer  the decision to she discuss with the family members. 07/09/17  on evaluation today patient appears to be doing fairly well in regard to her left anterior lower extremity wound. She has been tolerating the dressing changes without complication we have been utilizing Santyl at this time. She tells me that she is having no significant pain compared to what she has had in the past. She has decided after discussing with family that she is going to go forward with the surgery with Dr. dew. This is to restore better blood flow to left lower extremity. With that it does appear that her lower extremity wound is making some progress albeit slow. No fevers, chills, nausea, or vomiting noted at this time. 07/16/17 on evaluation today patient's wound appears to be doing roughly about the same although some of the slough is clearing off. [name] she has her appointment with vascular next week on Thursday the 23rd. With that being said her wound does not appear to be hurting as badly as it has in the past which is good news. No fevers, chills, nausea, or vomiting noted at this time. 07/30/2017 -- she had surgery on 07/22/2017 --indications being nonhealing ulcer on the left leg with a noninvasive study showing marked reduction in ABI of less than 0.5 and no digital pressure on her left leg. she had a percutaneous transluminal angioplasty of the left common and external iliac arteries. She then had a stent placed to the left external iliac arteries and to the left common iliac artery. the left lower extremity showed occlusion of the common femoral artery and the origins of the [name], [name] ([id number]) SFA and the profunda femoris artery. The flow distally was poor and there were multiple areas of high-grade stenosis or occlusion of the distal SFA and the popliteal artery with only a diseased peroneal artery as runoff distally. She has a postop appointment on September 23 08/20/2017 -- she went back to the  vascular office for an appointment on 08/12/2017 and bilateral ABIs were done which were notable for moderate right lower extremity arterial disease and unable to obtain left ankle brachial indices due to absent Doppler velocities in the posterior tibial artery and the anterior tibial artery. Monophasic waveforms to the left femoral and popliteal artery. this was worrisome for a failure of stents placed and lower extremity angiogram was recommended. as was done on 08/16/2017 and the stents placed in the left eye Lex system had some mild narrowing at the iliac bifurcation and the leading edge of the stent proximally was poor wall apposition with stenosis in the 50-60% range in the right common iliac artery. There was also occlusion of the common femoral artery and the origin of the SFA and profunda femoris artery. Flow distally was poor but they appeared to be multiple areas of high-grade stenosis or occlusion in the distal SFA and popliteal artery with only a diseased peroneal artery. in another stent was placed and deployed. The femoral occlusion would need to be treated surgically. 08/27/17 on evaluation today patient appears to be doing about the same in regard to her left for extremity wound. Unfortunately secondary to her vascular status this is not a very well healing wound at this time. She has been tolerating the dressing changes she has some discomfort but mainly with cleansing of the wound not at other times. No fevers, chills, nausea, or vomiting noted at this time. 09/17/2017 -- the patient was recently discharged from the hospital where she was admitted between 09/08/2017 and 09/10/2017, when she had a procedure on her left femoral  artery with the endarterectomy and a patch. she will be going back for review this coming week. 09/24/2017 -- was seen on 09/18/2017 in the ER, for bleeding from her wound which was debrided earlier during the day. At the time she was seen there was no active  bleeding. A thrombin pad was placed over the area and she was asked to change the outside dressing but come back to the wound center as planned. she was also seen at the [place] [facility] office for a review after her surgery and bilateral lower extremity ABI when compared to the previous ones showed minimal improvement of arterial blood flow. She was set up for a CT angiogram of the abdomen and pelvis and lower extremities to see the degree of peripheral arterial disease 10/28/2017 -- had a x-ray of the left tibia and fibula done on 10/07/2017 -- showed no bony acute abnormality she was reviewed by Dr. Corene Cornea dew on 10/26/2017, and he reviewed her CT angiogram and this demonstrated a widely patent left femoral endarterectomy including the origins of the femoral artery. She also had moderate distal disease in the popliteal artery, distal SFA and tibial disease. Her peroneal artery is a dominant runoff distally.he felt she had adequate perfusion for healing the mid calf level and he is putting off all surgical intervention for now and is going to see her back in 2-3 months for noninvasive studies. She also has a new wound on the plantar aspect of the left big toe where she pulled a piece of skin and has caused a superficial ulceration 11/19/17 on evaluation today patient appears to be doing decently well at this point in time. It does appear that her Wound VAC has been approved as ordered by Dr. Con Memos however she has not received that as of yet. Nonetheless the wound bed in general appears to have minimal slough noted at this time in a fairly good amount of granulation although there is to be a noted in the center of the wound. No fevers, chills, nausea, or vomiting noted at this time. Electronic Signature(s) Signed: 11/21/2017 1:59:17 AM By: Worthy Keeler PA-C Entered By: Worthy Keeler on 11/19/2017 17:21:48 [name], [name]  ([id number]) -------------------------------------------------------------------------------- Physical Exam Details Patient Name: Alexa Lowes P. Date of Service: 11/19/2017 3:15 PM Medical Record Number: [id number] Patient Account Number: [id number] Date of Birth/Sex: 21-Jun-1926 (83 y.o. Female) Treating RN: Carolyne Fiscal, Debi Primary Care Provider: Lucianne Lei Other Clinician: Referring Provider: Lucianne Lei Treating Provider/Extender: STONE III, Honesty Menta Weeks in Treatment: 25 Constitutional Thin and well-hydrated in no acute distress. Respiratory normal breathing without difficulty. clear to auscultation bilaterally. Cardiovascular regular rate and rhythm with normal S1, S2. Psychiatric this patient is able to make decisions and demonstrates good insight into disease process. Alert and Oriented x 3. pleasant and cooperative. Notes Patient's wound did not require debridement today on evaluation. There does not appear to be any evidence of infection although the tibia is noted in the center of the wound bed. In regard to her left great toe wound I am gonna recommend Santyl be initiated as there does appear to be some eschar and [facility] covering. Electronic Signature(s) Signed: 11/21/2017 1:59:17 AM By: Worthy Keeler PA-C Entered By: Worthy Keeler on 11/19/2017 17:22:46 [name], [name] ([id number]) -------------------------------------------------------------------------------- Physician Orders Details Patient Name: Alexa Lowes P. Date of Service: 11/19/2017 3:15 PM Medical Record Number: [id number] Patient Account Number: [id number] Date of Birth/Sex: Mar 03, 1926 (83 y.o. 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 Wound #2 Left Toe Great o Clean wound with Normal Saline. Primary Wound Dressing Wound #1 Left,Anterior Lower Leg o [place] Non-Adherent Wound #2 Left Toe Great o Santyl Ointment Secondary Dressing Wound #2 Left Toe Great o Other - coverlett Wound #1 Left,Anterior Lower Leg o Dry Gauze o Boardered Foam Dressing Dressing Change Frequency Wound #1 Left,Anterior Lower Leg o Change dressing every other day. Wound #2 Left Toe Great o Change dressing every other day. Follow-up Appointments o Return Appointment in 1 week. Electronic Signature(s) Signed: 11/19/2017 4:38:31 PM By: Montey Hora Signed: 11/21/2017 1:59:17 AM By: Verline Lema, Alfrieda P. ([id number]) Entered By: Montey Hora on 11/19/2017 15:56:14 [name], [name] ([id number]) -------------------------------------------------------------------------------- Problem List Details Patient Name: Alexa Lowes P. Date of Service: 11/19/2017 3:15 PM Medical Record Number: [id number] Patient Account Number: [id number] Date of Birth/Sex: Dec 25, 1925 (83 y.o. 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 ([id number]) -------------------------------------------------------------------------------- Progress Note Details Patient Name: Alexa Lowes P. Date of Service: 11/19/2017 3:15 PM Medical Record Number: [id number] Patient Account Number: [id number] Date of Birth/Sex: 1926/06/26 (83 y.o. Female) Treating RN: Carolyne Fiscal, Debi Primary Care Provider: Lucianne Lei Other Clinician: Referring Provider: Lucianne Lei Treating Provider/Extender: Melburn Hake, Cantrell Larouche Weeks in Treatment: 25 Subjective Chief Complaint Information obtained from Patient Patients presents for treatment of an open diabetic ulcer 2 the left lower anterior leg which she sustained an injury by blunt trauma History of Present Illness (HPI) The following HPI elements were documented for the patient's wound: Location: left anterior shin Quality: Patient reports experiencing a dull pain to affected area(s). Severity: Patient states wound are getting worse. Duration: Patient has had the wound for < 3 weeks prior to presenting for treatment Timing: Pain in wound is Intermittent (comes and goes Context: The wound occurred when the patient blunt trauma against her left shin Modifying Factors: Other treatment(s) tried include:active Bactroban ointment locally Associated Signs and Symptoms: Patient reports having: occasional discharge from the wound 83 year old patient was seen in the ED about 10 days ago for a history of abrasion to the left lower extremity while she was boarding a bus and scraped the anterior part of her left leg. She has been a diabetic for many years and has been taking treatment regularly. Her past medical history is also significant for anemia arthritis constipation and hypertension and is status post abdominal hysterectomy. She has never been a smoker. after her ER visit she was advised to  apply Bactroban ointment to the wound twice a day and continue to monitor her  blood glucose levels. her last hemoglobin A1c was done 3 years ago and was 6.6% 06/04/17 patient left anterior shin wound appears to be doing well although it is still somewhat dry despite the treatment with Medihoney. We have been using Kerlex following the Medihoney application and I believe this is just not retaining that much moisture. Nonetheless there is no evidence of infection. 06/11/17 on evaluation today patient appears to be doing a little bit worse in regard to her wound. The entirety of the wound is dry and unfortunately the Medihoney does not seem to be helping this. That is even with the dressing changes that I made last week to try to retain more moisture. She has not tried [employer] as of yet. She was also noncompressible when testing for ABIs. 07/01/2017 -- she had a arterial studies done and was seen by Dr. Lucky Cowboy. her noninvasive studies showed noncompressible vessels on the right but brisk waveforms and digital pressures of 71 on the right consistent with only mild arterial insufficienc. On the left her ABI was 0.47 and this may be falsely elevated due to calcification. No digital pressures were obtained on the left and this is consistent with severe arterial insufficiency. this critical limb threatening situation on the left, would make wound healing difficult and it represents a serious situation and he has recommended an angiography with possible revascularization. The patient desired to defer the decision to she discuss with the family members. 07/09/17 on evaluation today patient appears to be doing fairly well in regard to her left anterior lower extremity wound. She has been tolerating the dressing changes without complication we have been utilizing Santyl at this time. She tells me that she is having no significant pain compared to what she has had in the past. She has decided after discussing with  family that she is going to go forward with the surgery with Dr. dew. This is to restore better blood flow to left lower extremity. With that it does appear that her lower extremity wound is making some progress albeit slow. No fevers, chills, nausea, or vomiting noted at this time. [name], [name] ([id number]) 07/16/17 on evaluation today patient's wound appears to be doing roughly about the same although some of the slough is clearing off. [name] she has her appointment with vascular next week on Thursday the 23rd. With that being said her wound does not appear to be hurting as badly as it has in the past which is good news. No fevers, chills, nausea, or vomiting noted at this time. 07/30/2017 -- she had surgery on 07/22/2017 --indications being nonhealing ulcer on the left leg with a noninvasive study showing marked reduction in ABI of less than 0.5 and no digital pressure on her left leg. she had a percutaneous transluminal angioplasty of the left common and external iliac arteries. She then had a stent placed to the left external iliac arteries and to the left common iliac artery. 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 The flow distally was poor and there were multiple areas of high-grade stenosis or occlusion of the distal SFA and the popliteal artery with only a diseased peroneal artery as runoff distally. She has a postop appointment on September 23 08/20/2017 -- she went back to the vascular office for an appointment on 08/12/2017 and bilateral ABIs were done which were notable for moderate right lower extremity arterial disease and unable to obtain left  ankle brachial indices due to absent Doppler velocities in the posterior tibial artery and the anterior tibial artery. Monophasic waveforms to the left femoral and popliteal artery. this was worrisome for a failure of stents placed and lower  extremity angiogram was recommended. as was done on 08/16/2017 and the stents placed in the left eye Lex system had some mild narrowing at the iliac bifurcation and the leading edge of the stent proximally was poor wall apposition with stenosis in the 50-60% range in the right common iliac artery. There was also occlusion of the common femoral artery and the origin of the SFA and profunda femoris artery. Flow distally was poor but they appeared to be multiple areas of high-grade stenosis or occlusion in the distal SFA and popliteal artery with only a diseased peroneal artery. in another stent was placed and deployed. The femoral occlusion would need to be treated surgically. 08/27/17 on evaluation today patient appears to be doing about the same in regard to her left for extremity wound. Unfortunately secondary to her vascular status this is not a very well healing wound at this time. She has been tolerating the dressing changes she has some discomfort but mainly with cleansing of the wound not at other times. No fevers, chills, nausea, or vomiting noted at this time. 09/17/2017 -- the patient was recently discharged from the hospital where she was admitted between 09/08/2017 and 09/10/2017, when she had a procedure on her left femoral artery with the endarterectomy and a patch. she will be going back for review this coming week. 09/24/2017 -- was seen on 09/18/2017 in the ER, for bleeding from her wound which was debrided earlier during the day. At the time she was seen there was no active bleeding. A thrombin pad was placed over the area and she was asked to change the outside dressing but come back to the wound center as planned. she was also seen at the [place] [facility] office for a review after her surgery and bilateral lower extremity ABI when compared to the previous ones showed minimal improvement of arterial blood flow. She was set up for a CT angiogram of the abdomen and  pelvis and lower extremities to see the degree of peripheral arterial disease 10/28/2017 -- had a x-ray of the left tibia and fibula done on 10/07/2017 -- showed no bony acute abnormality she was reviewed by Dr. Corene Cornea dew on 10/26/2017, and he reviewed her CT angiogram and this demonstrated a widely patent left femoral endarterectomy including the origins of the femoral artery. She also had moderate distal disease in the popliteal artery, distal SFA and tibial disease. Her peroneal artery is a dominant runoff distally.he felt she had adequate perfusion for healing the mid calf level and he is putting off all surgical intervention for now and is going to see her back in 2-3 months for noninvasive studies. She also has a new wound on the plantar aspect of the left big toe where she pulled a piece of skin and has caused a superficial ulceration 11/19/17 on evaluation today patient appears to be doing decently well at this point in time. It does appear that her Wound VAC has been approved as ordered by Dr. Con Memos however she has not received that as of yet. Nonetheless the wound bed in general appears to have minimal slough noted at this time in a fairly good amount of granulation although there is to be a noted in the center of the wound. No fevers, chills,  nausea, or vomiting noted at this time. Patient History Information obtained from Patient. [name], [name] ([id number]) Social History Never smoker, Marital Status - Separated, Alcohol Use - Never, Drug Use - No History, Caffeine Use - Rarely. Review of Systems (ROS) Constitutional Symptoms (General Health) Denies complaints or symptoms of Fever, Chills. Respiratory The patient has no complaints or symptoms. Cardiovascular The patient has no complaints or symptoms. Psychiatric The patient has no complaints or symptoms. Objective Constitutional Thin and well-hydrated in no acute distress. Vitals Time Taken: 3:32 PM, Height: 65 in,  Weight: 131 lbs, BMI: 21.8, Temperature: 98.2 F, Pulse: 83 bpm, Respiratory Rate: 16 breaths/min, Blood Pressure: 148/63 mmHg. Respiratory normal breathing without difficulty. clear to auscultation bilaterally. Cardiovascular regular rate and rhythm with normal S1, S2. Psychiatric this patient is able to make decisions and demonstrates good insight into disease process. Alert and Oriented x 3. pleasant and cooperative. General Notes: Patient's wound did not require debridement today on evaluation. There does not appear to be any evidence of infection although the tibia is noted in the center of the wound bed. In regard to her left great toe wound I am gonna recommend Santyl be initiated as there does appear to be some eschar and [facility] covering. Integumentary (Hair, Skin) Wound #1 status is Open. Original cause of wound was Trauma. The wound is located on the Left,Anterior Lower Leg. The wound measures 7.4cm length x 3.6cm width x 0.2cm depth; 20.923cm^2 area and 4.185cm^3 volume. There is bone, Fat Layer (Subcutaneous Tissue) Exposed, and fascia exposed. There is no tunneling or undermining noted. There is a large amount of serous drainage noted. The wound margin is flat and intact. There is medium (34-66%) red granulation within the wound bed. There is a medium (34-66%) amount of necrotic tissue within the wound bed including Adherent Slough. The periwound skin appearance exhibited: Scarring, Maceration, Hemosiderin Staining, Erythema. The periwound skin appearance did not exhibit: Callus, Crepitus, Excoriation, Induration, Rash, Dry/Scaly, Atrophie Blanche, Cyanosis, Ecchymosis, Mottled, Pallor, Rubor. The surrounding wound skin color is noted with erythema which is circumferential. Periwound temperature was noted as No Abnormality. The periwound has tenderness on palpation. Wound #2 status is Open. Original cause of wound was Trauma. The wound is located on the Left Toe Great. The  wound measures 0.6cm length x 0.6cm width x 0.1cm depth; 0.283cm^2 area and 0.028cm^3 volume. There is no tunneling or undermining noted. There is a medium amount of serous drainage noted. The wound margin is flat and intact. There is no Woodfin, Katrin P. ([id number]) granulation within the wound bed. There is a large (67-100%) amount of necrotic tissue within the wound bed including Eschar and Adherent Slough. The periwound skin appearance did not exhibit: Callus, Crepitus, Excoriation, Induration, Rash, Scarring, Dry/Scaly, Maceration, Atrophie Blanche, Cyanosis, Ecchymosis, Hemosiderin Staining, Mottled, Pallor, Rubor, Erythema. Periwound temperature was noted as No Abnormality. 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 Wound #2 Left Toe Great: Clean wound with Normal Saline. Primary Wound Dressing: Wound #1 Left,Anterior Lower Leg: Silvercel Non-Adherent Wound #2 Left Toe Great: Santyl Ointment Secondary Dressing: Wound #2 Left Toe Great: Other - coverlett Wound #1 Left,Anterior Lower Leg: Dry Gauze Boardered Foam Dressing Dressing Change Frequency: Wound #1 Left,Anterior Lower Leg: Change dressing every  other day. Wound #2 Left Toe Great: Change dressing every other day. Follow-up Appointments: Return Appointment in 1 week. We are gonna continue with orders per above for patients wound care over the next week. We will see were things stand following. Please see above for specific wound care orders. We will see patient for re-evaluation in 1 week(s) here in the clinic. If anything worsens or changes patient will contact our office for additional recommendations. [name], [name]  ([id number]) Electronic Signature(s) Signed: 11/21/2017 1:59:17 AM By: Worthy Keeler PA-C Entered By: Worthy Keeler on 11/19/2017 17:23:05 [name], [name] ([id number]) -------------------------------------------------------------------------------- ROS/PFSH Details Patient Name: Alexa Lowes P. Date of Service: 11/19/2017 3:15 PM Medical Record Number: [id number] Patient Account Number: [id number] Date of Birth/Sex: 10-May-1926 (83 y.o. Female) Treating RN: Carolyne Fiscal, Debi Primary Care Provider: Lucianne Lei Other Clinician: Referring Provider: Lucianne Lei Treating Provider/Extender: Melburn Hake, Hollie Wojahn Weeks in Treatment: 25 Information Obtained From Patient Wound History Do you currently have one or more open woundso Yes How many open wounds do you currently haveo 1 Approximately how long have you had your woundso 3 weeks How have you been treating your wound(s) until nowo open to air Has your wound(s) ever healed and then re-openedo No Have you had any lab work done in the past montho No Have you tested positive for an antibiotic resistant organism (MRSA, VRE)o No Have you tested positive for osteomyelitis (bone infection)o No Have you had any tests for circulation on your legso No Constitutional Symptoms (General Health) Complaints and Symptoms: Negative for: Fever; Chills Hematologic/Lymphatic Medical History: Positive for: Anemia Respiratory Complaints and Symptoms: No Complaints or Symptoms Cardiovascular Complaints and Symptoms: No Complaints or Symptoms Medical History: Positive for: Hypertension Endocrine Medical History: Positive for: Type II Diabetes Treated with: Oral agents Musculoskeletal Medical History: Positive for: Osteoarthritis Oncologic IVI, GRIFFITH ([id number]) Medical History: Negative for: Received Chemotherapy; Received Radiation Psychiatric Complaints and Symptoms: No Complaints or Symptoms Immunizations Pneumococcal  Vaccine: Received Pneumococcal Vaccination: Yes Immunization Notes: up to date Implantable Devices Family and Social History Never smoker; Marital Status - Separated; Alcohol Use: Never; Drug Use: No History; Caffeine Use: Rarely; Financial Concerns: No; Food, Clothing or Shelter Needs: No; Support System Lacking: No; Transportation Concerns: No; Advanced Directives: No; Patient does not want information on Advanced Directives Physician Affirmation I have reviewed and agree with the above information. Electronic Signature(s) Signed: 11/21/2017 1:59:17 AM By: Worthy Keeler PA-C Signed: 11/24/2017 4:38:32 PM By: Alric Quan Entered By: Worthy Keeler on 11/19/2017 17:22:12 [name], [name] ([id number]) -------------------------------------------------------------------------------- SuperBill Details Patient Name: Alexa Lowes P. Date of Service: 11/19/2017 Medical Record Number: [id number] Patient Account Number: [id number] Date of Birth/Sex: Aug 25, 1926 (83 y.o. 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

## 2017-12-02 ENCOUNTER — Encounter: Payer: Medicare Other | Attending: Physician Assistant | Admitting: Physician Assistant

## 2017-12-02 DIAGNOSIS — Z7984 Long term (current) use of oral hypoglycemic drugs: Secondary | ICD-10-CM | POA: Insufficient documentation

## 2017-12-02 DIAGNOSIS — M1990 Unspecified osteoarthritis, unspecified site: Secondary | ICD-10-CM | POA: Diagnosis not present

## 2017-12-02 DIAGNOSIS — E11622 Type 2 diabetes mellitus with other skin ulcer: Secondary | ICD-10-CM | POA: Insufficient documentation

## 2017-12-02 DIAGNOSIS — I70242 Atherosclerosis of native arteries of left leg with ulceration of calf: Secondary | ICD-10-CM | POA: Insufficient documentation

## 2017-12-02 DIAGNOSIS — I10 Essential (primary) hypertension: Secondary | ICD-10-CM | POA: Insufficient documentation

## 2017-12-02 DIAGNOSIS — D649 Anemia, unspecified: Secondary | ICD-10-CM | POA: Insufficient documentation

## 2017-12-02 DIAGNOSIS — L97222 Non-pressure chronic ulcer of left calf with fat layer exposed: Secondary | ICD-10-CM | POA: Diagnosis not present

## 2017-12-03 NOTE — Progress Notes (Signed)
[name], [name] ([id number]) Visit Report for 12/02/2017 Arrival Information Details Patient Name: [name], [name] Date of Service: 12/02/2017 2:15 PM Medical Record Number: [id number] Patient Account Number: [id number] Date of Birth/Sex: 1926/08/08 (84 y.o. Female) Treating RN: Carolyne Fiscal, Debi Primary Care Dujuan Stankowski: Lucianne Lei Other Clinician: Referring Sarabelle Genson: Lucianne Lei Treating Damontae Loppnow/Extender: Melburn Hake, HOYT Weeks in Treatment: 26 Visit Information History Since Last Visit All ordered tests and consults were completed: No Patient Arrived: Cane Added or deleted any medications: No Arrival Time: 14:00 Any new allergies or adverse reactions: No Accompanied By: self Had a fall or experienced change in No Transfer Assistance: EasyPivot Patient activities of daily living that may affect Lift risk of falls: Patient Identification Verified: Yes Signs or symptoms of abuse/neglect since last visito No Secondary Verification Process Yes Hospitalized since last visit: No Completed: Has Dressing in Place as Prescribed: Yes Patient Requires Transmission-Based No Precautions: Pain Present Now: No Patient Has Alerts: Yes Electronic Signature(s) Signed: 12/02/2017 4:31:09 PM By: Alric Quan Entered By: Alric Quan on 12/02/2017 14:04:42 [name], [name] ([id number]) -------------------------------------------------------------------------------- Encounter Discharge Information Details Patient Name: Alexa Lowes P. Date of Service: 12/02/2017 2:15 PM Medical Record Number: [id number] Patient Account Number: [id number] Date of Birth/Sex: [birth year] (84 y.o. Female) Treating RN: Carolyne Fiscal, Debi Primary Care Cailah Reach: Lucianne Lei Other Clinician: Referring Rhanda Lemire: Lucianne Lei Treating Cornesha Radziewicz/Extender: Melburn Hake, HOYT Weeks in Treatment: 26 Encounter Discharge Information Items Discharge Pain Level: 0 Discharge Condition: Stable Ambulatory Status:  Cane Discharge Destination: Home Transportation: Private Auto Accompanied By: son Schedule Follow-up Appointment: Yes Medication Reconciliation completed and No provided to Patient/Care Mykeria Garman: Provided on Clinical Summary of Care: 12/02/2017 Form Type Recipient Paper Patient [place] Signature(s) Signed: 12/03/2017 8:42:55 AM By: Ruthine Dose Previous Signature: 12/02/2017 2:26:51 PM Version By: Alric Quan Entered By: Ruthine Dose on 12/02/2017 14:56:59 [name], [name] ([id number]) -------------------------------------------------------------------------------- Lower Extremity Assessment Details Patient Name: Alexa Lowes P. Date of Service: 12/02/2017 2:15 PM Medical Record Number: [id number] Patient Account Number: [id number] Date of Birth/Sex: 1926/02/05 (84 y.o. Female) Treating RN: Carolyne Fiscal, Debi Primary Care Myleen Brailsford: Lucianne Lei Other Clinician: Referring Alia Parsley: Lucianne Lei Treating Saniah Schroeter/Extender: Melburn Hake, HOYT Weeks in Treatment: 26 Vascular Assessment Pulses: Dorsalis Pedis Palpable: [Left:Yes] Posterior Tibial Extremity colors, hair growth, and conditions: Extremity Color: [Left:Hyperpigmented] Temperature of Extremity: [Left:Warm] Capillary Refill: [Left:< 3 seconds] Toe Nail Assessment Left: Right: Thick: Yes Discolored: Yes Deformed: No Improper Length and Hygiene: No Electronic Signature(s) Signed: 12/02/2017 4:31:09 PM By: Alric Quan Entered By: Alric Quan on 12/02/2017 14:23:07 [name], [name] ([id number]) -------------------------------------------------------------------------------- Multi Wound Chart Details Patient Name: Alexa Lowes P. Date of Service: 12/02/2017 2:15 PM Medical Record Number: [id number] Patient Account Number: [id number] Date of Birth/Sex: Jun 18, 1926 (84 y.o. 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] [3:Skin Tear] Secondary Etiology: [1:Trauma, Other] [2:N/A] [3:N/A] Comorbid History: [1:Anemia, Hypertension, Type II Diabetes, Osteoarthritis] [2:Anemia, Hypertension, Type II Diabetes, Osteoarthritis] [3:Anemia, Hypertension, Type II Diabetes, Osteoarthritis] Date Acquired: [1:05/02/2017] [2:10/28/2017] [3:12/02/2017] Weeks of Treatment: [1:26] [2:5] [3:0] Wound Status: [1:Open] [2:Open] [3:Open] Measurements L x W x  D [1:7.5x3.4x0.2] [2:1x1x0.1] [3:0.6x0.3x0.1] (cm) Area (cm) : [1:20.028] [2:0.785] [3:0.141] Volume (cm) : [1:4.006] [2:0.079] [3:0.014] % Reduction in Area: [1:-316.60%] [2:-256.80%] [3:N/A] % Reduction in Volume: [1:-316.90%] [2:-259.10%] [3:N/A] Classification: [1:Grade 2] [2:Full Thickness Without Exposed Support Structures] [3:Partial Thickness] Exudate Amount: [1:Large] [2:Large] [3:Large] Exudate Type: [1:Serous] [2:Serous] [3:Serosanguineous] Exudate Color: [1:amber] [2:amber] [3:red, brown] Wound Margin: [1:Flat and Intact] [2:Flat and Intact] [3:Distinct, outline attached] Granulation Amount: [1:Medium (34-66%)] [2:None Present (0%)] [3:Large (67-100%)] Granulation Quality: [1:Red] [2:N/A] [3:Red] Necrotic Amount: [1:Medium (34-66%)] [2:Large (67-100%)] [3:None Present (0%)] Necrotic Tissue: [1:Adherent Slough] [2:Eschar, Adherent Slough] [3:N/A] Exposed Structures: [1:Fascia: Yes Fat Layer (Subcutaneous Tissue)  Exposed: Yes Bone: Yes Tendon: No Muscle: No Joint: No] [2:Fascia: No Fat Layer (Subcutaneous Tissue) Exposed: No Tendon: No Muscle: No Joint: No Bone: No] [3:Fascia: No Fat Layer (Subcutaneous Tissue)  Exposed: No Tendon: No Muscle: No Joint: No Bone: No] Epithelialization: [1:None] [2:None] [3:None] Periwound Skin Texture: [1:Scarring: Yes Excoriation: No] [2:Excoriation: No Induration: No] [3:No Abnormalities Noted] Induration: No Callus: No Callus: No Crepitus: No Crepitus: No Rash: No Rash: No Scarring: No Periwound Skin Moisture: Maceration: Yes Maceration: No No Abnormalities Noted Dry/Scaly: No Dry/Scaly: No Periwound Skin Color: Erythema: Yes Atrophie Blanche: No No Abnormalities Noted Hemosiderin Staining: Yes Cyanosis: No Atrophie Blanche: No Ecchymosis: No Cyanosis: No Erythema: No Ecchymosis: No Hemosiderin Staining: No Mottled: No Mottled: No Pallor: No Pallor: No Rubor: No Rubor: No Erythema Location: Circumferential N/A N/A Temperature: No Abnormality No Abnormality No Abnormality Tenderness on Palpation: Yes No Yes Wound Preparation: Ulcer Cleansing: Ulcer Cleansing: Ulcer Cleansing: Rinsed/Irrigated with Saline Rinsed/Irrigated with Saline Rinsed/Irrigated with Saline Topical Anesthetic Applied: Topical Anesthetic Applied: Topical Anesthetic Applied: Other: lidocaine 4% Other: lidocaine 4% Other: lidocaine 4% Treatment Notes Electronic Signature(s) Signed: 12/02/2017 4:31:09 PM By: Alric Quan Entered By: Alric Quan on 12/02/2017 14:24:19 [name], [name] ([id number]) -------------------------------------------------------------------------------- Alexa Details Patient Name: Alexa Lowes P. Date of Service: 12/02/2017 2:15 PM Medical Record Number: [id number] Patient Account Number: [id number] Date of Birth/Sex: 05-07-1926 (84 y.o. Female) Treating RN: Carolyne Fiscal, Debi Primary Care Synia Douglass: Lucianne Lei  Other Clinician: Referring Anneka Studer: Lucianne Lei Treating Alyxis Grippi/Extender: Melburn Hake, HOYT Weeks in Treatment: 26 Active Inactive ` Abuse / Safety / Falls / Self Care Management Nursing Diagnoses: Potential for falls Goals: Patient will remain injury free related to falls Date Initiated: 05/28/2017 Target Resolution Date: 07/23/2017 Goal Status: Active Interventions: Assess fall risk on admission and as needed Notes: ` Orientation to the Wound Care Program Nursing Diagnoses: Knowledge deficit related to the wound healing center program Goals: Patient/caregiver will verbalize understanding of the [facility] Date Initiated: 05/28/2017 Target Resolution Date: 07/23/2017 Goal Status: Active Interventions: Provide education on orientation to the wound center Notes: ` Wound/Skin Impairment Nursing Diagnoses: Impaired tissue integrity Goals: Ulcer/skin breakdown will have a volume reduction of 30% by week 4 Date Initiated: 05/28/2017 Target Resolution Date: 07/23/2017 Goal Status: Active Ulcer/skin breakdown will have a volume reduction of 50% by week 8 Date Initiated: 05/28/2017 Target Resolution Date: 07/23/2017 CATILYN, BOGGUS ([id number]) Goal Status: Active Ulcer/skin breakdown will have a volume reduction of 80% by week 12 Date Initiated: 05/28/2017 Target Resolution Date: 07/23/2017 Goal Status: Active Ulcer/skin breakdown will heal within 14 weeks Date Initiated: 05/28/2017 Target Resolution Date: 07/23/2017 Goal Status: Active Interventions: Assess patient/caregiver ability to obtain necessary supplies Assess patient/caregiver ability to perform ulcer/skin care regimen upon admission and as needed Assess ulceration(s) every visit Notes: Electronic Signature(s) Signed: 12/02/2017 4:31:09 PM By: Alric Quan Entered  By: Alric Quan on 12/02/2017 14:24:07 [name], [name]  ([id number]) -------------------------------------------------------------------------------- Pain Assessment Details Patient Name: Alexa Lowes P. Date of Service: 12/02/2017 2:15 PM Medical Record Number: [id number] Patient Account Number: [id number] Date of Birth/Sex: 1926-06-15 (84 y.o. Female) Treating RN: Carolyne Fiscal, Debi Primary Care Durk Carmen: Lucianne Lei Other Clinician: Referring Aristotelis Vilardi: Lucianne Lei Treating Glendale Youngblood/Extender: Melburn Hake, HOYT Weeks in Treatment: 26 Active Problems Location of Pain Severity and Description of Pain Patient Has Paino No Site Locations Pain Management and Medication Current Pain Management: Electronic Signature(s) Signed: 12/02/2017 4:31:09 PM By: Alric Quan Entered By: Alric Quan on 12/02/2017 14:04:48 [name], [name] ([id number]) -------------------------------------------------------------------------------- Patient/Caregiver Education Details Patient Name: Alexa Lowes P. Date of Service: 12/02/2017 2:15 PM Medical Record Number: [id number] Patient Account Number: [id number] Date of Birth/Gender: February 02, 1926 (84 y.o. Female) Treating RN: Carolyne Fiscal, Debi Primary Care Physician: Lucianne Lei Other Clinician: Referring Physician: Lucianne Lei Treating Physician/Extender: Sharalyn Ink in Treatment: 26 Education Assessment Education Provided To: Patient Education Topics Provided Wound/Skin Impairment: Handouts: Caring for Your Ulcer, Other: change dressing as ordered Methods: Demonstration, Explain/Verbal Responses: State content correctly Electronic Signature(s) Signed: 12/02/2017 4:31:09 PM By: Alric Quan Entered By: Alric Quan on 12/02/2017 14:32:13 [name], [name] ([id number]) -------------------------------------------------------------------------------- Wound Assessment Details Patient Name: Alexa Lowes P. Date of Service: 12/02/2017 2:15 PM Medical Record Number:  [id number] Patient Account Number: [id number] Date of Birth/Sex: [birth year] (84 y.o. Female) Treating RN: Carolyne Fiscal, Debi Primary Care Laporshia Hogen: Lucianne Lei Other Clinician: Referring Thara Searing: Lucianne Lei Treating Suheyla Mortellaro/Extender: STONE III, HOYT Weeks in Treatment: 26 Wound Status Wound Number: 1 Primary Etiology: Diabetic Wound/Ulcer of the Lower Extremity Wound Location: Left Lower Leg - Anterior Secondary Trauma, Other Wounding Event: Trauma Etiology: Date Acquired: 05/02/2017 Wound Status: Open Weeks Of Treatment: 26 Comorbid Anemia, Hypertension, Type II Diabetes, Clustered Wound: No History: Osteoarthritis Photos Photo Uploaded By: Alric Quan on 12/02/2017 15:27:43 Wound Measurements Length: (cm) 7.5 Width: (cm) 3.4 Depth: (cm) 0.2 Area: (cm) 20.028 Volume: (cm) 4.006 % Reduction in Area: -316.6% % Reduction in Volume: -316.9% Epithelialization: None Tunneling: No Undermining: No Wound Description Classification: Grade 2 Wound Margin: Flat and Intact Exudate Amount: Large Exudate Type: Serous Exudate Color: amber Foul Odor After Cleansing: No Slough/Fibrino Yes Wound Bed Granulation Amount: Medium (34-66%) Exposed Structure Granulation Quality: Red Fascia Exposed: Yes Necrotic Amount: Medium (34-66%) Fat Layer (Subcutaneous Tissue) Exposed: Yes Necrotic Quality: Adherent Slough Tendon Exposed: No Muscle Exposed: No Joint Exposed: No Bone Exposed: Yes Periwound Skin Texture Kamm, Oceanna P. ([id number]) Texture Color No Abnormalities Noted: No No Abnormalities Noted: No Callus: No Atrophie Blanche: No Crepitus: No Cyanosis: No Excoriation: No Ecchymosis: No Induration: No Erythema: Yes Rash: No Erythema Location: Circumferential Scarring: Yes Hemosiderin Staining: Yes Mottled: No Moisture Pallor: No No Abnormalities Noted: No Rubor: No Dry / Scaly: No Maceration: Yes Temperature / Pain Temperature: No  Abnormality Tenderness on Palpation: Yes Wound Preparation Ulcer Cleansing: Rinsed/Irrigated with Saline Topical Anesthetic Applied: Other: lidocaine 4%, Treatment Notes Wound #1 (Left, Anterior Lower Leg) 1. Cleansed with: Clean wound with Normal Saline 2. Anesthetic Topical Lidocaine 4% cream to wound bed prior to debridement 3. Peri-wound Care: Skin Prep 4. Dressing Applied: Other dressing (specify in notes) 5. Secondary Dressing Applied Bordered Foam Dressing Dry Gauze Notes silvercel Electronic Signature(s) Signed: 12/02/2017 4:31:09 PM By: Alric Quan Entered By: Alric Quan on 12/02/2017 14:10:07 [name], [name] ([id number]) -------------------------------------------------------------------------------- Wound Assessment Details Patient Name: Alexa Lowes P. Date of Service: 12/02/2017 2:15 PM Medical Record Number: [id number] Patient Account Number:  [id number] Date of Birth/Sex: 1926/01/21 (84 y.o. Female) Treating RN: Carolyne Fiscal, Debi Primary Care Khyran Riera: Lucianne Lei Other Clinician: Referring Husein Guedes: Lucianne Lei Treating Diesha Rostad/Extender: Melburn Hake, HOYT Weeks in Treatment: 26 Wound Status Wound Number: 2 Primary Arterial Insufficiency Ulcer Etiology: Wound Location: Left Toe Great Wound Status: Open Wounding Event: Trauma Comorbid Anemia, Hypertension, Type II Diabetes, Date Acquired: 10/28/2017 History: Osteoarthritis Weeks Of Treatment: 5 Clustered Wound: No Photos Photo Uploaded By: Alric Quan on 12/02/2017 15:28:49 Wound Measurements Length: (cm) 1 Width: (cm) 1 Depth: (cm) 0.1 Area: (cm) 0.785 Volume: (cm) 0.079 % Reduction in Area: -256.8% % Reduction in Volume: -259.1% Epithelialization: None Tunneling: No Undermining: No Wound Description Full Thickness Without Exposed Support Classification: Structures Wound Margin: Flat and Intact Exudate Large Amount: Exudate Type: Serous Exudate Color: amber Foul  Odor After Cleansing: No Slough/Fibrino Yes Wound Bed Granulation Amount: None Present (0%) Exposed Structure Necrotic Amount: Large (67-100%) Fascia Exposed: No Necrotic Quality: Eschar, Adherent Slough Fat Layer (Subcutaneous Tissue) Exposed: No Tendon Exposed: No Muscle Exposed: No Joint Exposed: No Bone Exposed: No Zeis, Davelyn P. ([id number]) Periwound Skin Texture Texture Color No Abnormalities Noted: No No Abnormalities Noted: No Callus: No Atrophie Blanche: No Crepitus: No Cyanosis: No Excoriation: No Ecchymosis: No Induration: No Erythema: No Rash: No Hemosiderin Staining: No Scarring: No Mottled: No Pallor: No Moisture Rubor: No No Abnormalities Noted: No Dry / Scaly: No Temperature / Pain Maceration: No Temperature: No Abnormality Wound Preparation Ulcer Cleansing: Rinsed/Irrigated with Saline Topical Anesthetic Applied: Other: lidocaine 4%, Electronic Signature(s) Signed: 12/02/2017 4:31:09 PM By: Alric Quan Entered By: Alric Quan on 12/02/2017 14:21:10 [name], [name] ([id number]) -------------------------------------------------------------------------------- Wound Assessment Details Patient Name: Alexa Lowes P. Date of Service: 12/02/2017 2:15 PM Medical Record Number: [id number] Patient Account Number: [id number] Date of Birth/Sex: 12-Jun-1926 (84 y.o. Female) Treating RN: Carolyne Fiscal, Debi Primary Care Wylie Coon: Lucianne Lei Other Clinician: Referring Rhemi Balbach: Lucianne Lei Treating Kymberli Wiegand/Extender: Melburn Hake, HOYT Weeks in Treatment: 26 Wound Status Wound Number: 3 Primary Skin Tear Etiology: Wound Location: Left Toe Great - Medial Wound Status: Open Wounding Event: Gradually Appeared Comorbid Anemia, Hypertension, Type II Diabetes, Date Acquired: 12/02/2017 History: Osteoarthritis Weeks Of Treatment: 0 Clustered Wound: No Photos Photo Uploaded By: Alric Quan on 12/02/2017 15:28:49 Wound Measurements Length:  (cm) 0.6 Width: (cm) 0.3 Depth: (cm) 0.1 Area: (cm) 0.141 Volume: (cm) 0.014 % Reduction in Area: % Reduction in Volume: Epithelialization: None Tunneling: No Undermining: No Wound Description Classification: Partial Thickness Wound Margin: Distinct, outline attached Exudate Amount: Large Exudate Type: Serosanguineous Exudate Color: red, brown Foul Odor After Cleansing: No Slough/Fibrino No Wound Bed Granulation Amount: Large (67-100%) Exposed Structure Granulation Quality: Red Fascia Exposed: No Necrotic Amount: None Present (0%) Fat Layer (Subcutaneous Tissue) Exposed: No Tendon Exposed: No Muscle Exposed: No Joint Exposed: No Bone Exposed: No Periwound Skin Texture Mcalexander, Neomi P. ([id number]) Texture Color No Abnormalities Noted: No No Abnormalities Noted: No Moisture Temperature / Pain No Abnormalities Noted: No Temperature: No Abnormality Tenderness on Palpation: Yes Wound Preparation Ulcer Cleansing: Rinsed/Irrigated with Saline Topical Anesthetic Applied: Other: lidocaine 4%, Treatment Notes Wound #3 (Left, Medial Toe Great) 1. Cleansed with: Clean wound with Normal Saline 2. Anesthetic Topical Lidocaine 4% cream to wound bed prior to debridement 4. Dressing Applied: Other dressing (specify in notes) Notes coverlet, silvercel Electronic Signature(s) Signed: 12/02/2017 4:31:09 PM By: Alric Quan Entered By: Alric Quan on 12/02/2017 14:22:33 Knee, YIFOYDXA P. ([id number]) -------------------------------------------------------------------------------- Vitals Details Patient Name: Alexa Lowes P. Date of Service: 12/02/2017 2:15 PM Medical Record  Number: [id number] Patient Account Number: [id number] Date of Birth/Sex: 1926-01-21 (84 y.o. Female) Treating RN: Carolyne Fiscal, Debi Primary Care Yousif Edelson: Lucianne Lei Other Clinician: Referring Krisalyn Yankowski: Lucianne Lei Treating Adison Jerger/Extender: Melburn Hake, HOYT Weeks in Treatment:  26 Vital Signs Time Taken: 14:04 Temperature (F): 97.8 Height (in): 65 Pulse (bpm): 96 Weight (lbs): 131 Respiratory Rate (breaths/min): 16 Body Mass Index (BMI): 21.8 Blood Pressure (mmHg): 147/69 Reference Range: 80 - 120 mg / dl Electronic Signature(s) Signed: 12/02/2017 4:31:09 PM By: Alric Quan Entered By: Alric Quan on 12/02/2017 14:06:38

## 2017-12-03 NOTE — Progress Notes (Addendum)
[name] ([id number]) Visit Report for 12/02/2017 Chief Complaint Document Details Patient Name: [name], [name] Date of Service: 12/02/2017 2:15 PM Medical Record Number: [id number] Patient Account Number: [id number] Date of Birth/Sex: 21-Jun-1926 (84 y.o. Female) Treating RN: Carolyne Fiscal, Debi Primary Care Provider: Lucianne Lei Other Clinician: Referring Provider: Lucianne Lei Treating Provider/Extender: Melburn Hake, HOYT Weeks in Treatment: 26 Information Obtained from: Patient Chief Complaint Patients presents for treatment of an open diabetic ulcer 2 the left lower anterior leg which she sustained an injury by blunt trauma Electronic Signature(s) Signed: 12/02/2017 6:11:24 PM By: Worthy Keeler PA-C Entered By: Worthy Keeler on 12/02/2017 14:28:24 Berrong, [name] ([id number]) -------------------------------------------------------------------------------- Debridement Details Patient Name: Alexa Lowes P. Date of Service: 12/02/2017 2:15 PM Medical Record Number: [id number] Patient Account Number: [id number] Date of Birth/Sex: Jul 24, 1926 (84 y.o. Female) Treating RN: Carolyne Fiscal, Debi Primary Care Provider: Lucianne Lei Other Clinician: Referring Provider: Lucianne Lei Treating Provider/Extender: Melburn Hake, HOYT Weeks in Treatment: 26 Debridement Performed for Wound #2 Left,Plantar Toe Great Assessment: Performed By: Physician STONE III, HOYT E., PA-C Debridement: Open Wound/Selective Severity of Tissue Pre Fat layer exposed Debridement: Debridement Description: Selective Pre-procedure Verification/Time Yes - 14:40 Out Taken: Start Time: 14:41 Pain Control: Lidocaine 4% Topical Solution Level: Non-Viable Tissue Total Area Debrided (L x W): 1 (cm) x 1 (cm) = 1 (cm) Tissue and other material Non-Viable, Exudate, Fibrin/Slough debrided: Instrument: Forceps, Scissors Bleeding: Minimum Hemostasis Achieved: Pressure End Time: 14:43 Procedural Pain:  0 Post Procedural Pain: 0 Response to Treatment: Procedure was tolerated well Post Debridement Measurements of Total Wound Length: (cm) 1 Width: (cm) 1 Depth: (cm) 0.2 Volume: (cm) 0.157 Character of Wound/Ulcer Post Debridement: Requires Further Debridement Severity of Tissue Post Debridement: Fat layer exposed Post Procedure Diagnosis Same as Pre-procedure Electronic Signature(s) Signed: 12/14/2017 3:57:50 PM By: Alric Quan Signed: 12/22/2017 7:57:29 AM By: Worthy Keeler PA-C Previous Signature: 12/02/2017 4:31:09 PM Version By: Alric Quan Previous Signature: 12/02/2017 6:11:24 PM Version By: Worthy Keeler PA-C Entered By: Alric Quan on 12/14/2017 15:57:50 Mcquillen, [name] ([id number]) -------------------------------------------------------------------------------- HPI Details Patient Name: Alexa Lowes P. Date of Service: 12/02/2017 2:15 PM Medical Record Number: [id number] Patient Account Number: [id number] Date of Birth/Sex: 1926-05-09 (84 y.o. Female) Treating RN: Carolyne Fiscal, Debi Primary Care Provider: Lucianne Lei Other Clinician: Referring Provider: Lucianne Lei Treating Provider/Extender: Melburn Hake, HOYT Weeks in Treatment: 26 History of Present Illness Location: left anterior shin Quality: Patient reports experiencing a dull pain to affected area(s). Severity: Patient states wound are getting worse. Duration: Patient has had the wound for < 3 weeks prior to presenting for treatment Timing: Pain in wound is Intermittent (comes and goes Context: The wound occurred when the patient blunt trauma against her left shin Modifying Factors: Other treatment(s) tried include:active Bactroban ointment locally Associated Signs and Symptoms: Patient reports having: occasional discharge from the wound HPI Description: 84 year old patient was seen in the ED about 10 days ago for a history of abrasion to the left lower extremity while she was boarding a bus and  scraped the anterior part of her left leg. She has been a diabetic for many years and has been taking treatment regularly. Her past medical history is also significant for anemia arthritis constipation and hypertension and is status post abdominal hysterectomy. She has never been a smoker. after her ER visit she was advised to apply Bactroban ointment to the wound twice a day and continue to monitor her blood glucose levels. her last hemoglobin A1c was done 3  years ago and was 6.6% 06/04/17 patient left anterior shin wound appears to be doing well although it is still somewhat dry despite the treatment with Medihoney. We have been using Kerlex following the Medihoney application and I believe this is just not retaining that much moisture. Nonetheless there is no evidence of infection. 06/11/17 on evaluation today patient appears to be doing a little bit worse in regard to her wound. The entirety of the wound is dry and unfortunately the Medihoney does not seem to be helping this. That is even with the dressing changes that I made last week to try to retain more moisture. She has not tried [employer] as of yet. She was also noncompressible when testing for ABIs. 07/01/2017 -- she had a arterial studies done and was seen by Dr. Lucky Cowboy. her noninvasive studies showed noncompressible vessels on the right but brisk waveforms and digital pressures of 71 on the right consistent with only mild arterial insufficienc. On the left her ABI was 0.47 and this may be falsely elevated due to calcification. No digital pressures were obtained on the left and this is consistent with severe arterial insufficiency. this critical limb threatening situation on the left, would make wound healing difficult and it represents a serious situation and he has recommended an angiography with possible revascularization. The patient desired to defer the decision to she discuss with the family members. 07/09/17 on evaluation today patient  appears to be doing fairly well in regard to her left anterior lower extremity wound. She has been tolerating the dressing changes without complication we have been utilizing Santyl at this time. She tells me that she is having no significant pain compared to what she has had in the past. She has decided after discussing with family that she is going to go forward with the surgery with Dr. dew. This is to restore better blood flow to left lower extremity. With that it does appear that her lower extremity wound is making some progress albeit slow. No fevers, chills, nausea, or vomiting noted at this time. 07/16/17 on evaluation today patient's wound appears to be doing roughly about the same although some of the slough is clearing off. [name] she has her appointment with vascular next week on Thursday the 23rd. With that being said her wound does not appear to be hurting as badly as it has in the past which is good news. No fevers, chills, nausea, or vomiting noted at this time. 07/30/2017 -- she had surgery on 07/22/2017 --indications being nonhealing ulcer on the left leg with a noninvasive study showing marked reduction in ABI of less than 0.5 and no digital pressure on her left leg. she had a percutaneous transluminal angioplasty of the left common and external iliac arteries. She then had a stent placed to the left external iliac arteries and to the left common iliac artery. the left lower extremity showed occlusion of the common femoral artery and the origins of the Clendenin, Nakeita P. ([id number]) SFA and the profunda femoris artery. The flow distally was poor and there were multiple areas of high-grade stenosis or occlusion of the distal SFA and the popliteal artery with only a diseased peroneal artery as runoff distally. She has a postop appointment on September 23 08/20/2017 -- she went back to the vascular office for an appointment on 08/12/2017 and bilateral ABIs were done which were  notable for moderate right lower extremity arterial disease and unable to obtain left ankle brachial indices due to absent Doppler velocities in  the posterior tibial artery and the anterior tibial artery. Monophasic waveforms to the left femoral and popliteal artery. this was worrisome for a failure of stents placed and lower extremity angiogram was recommended. as was done on 08/16/2017 and the stents placed in the left eye Lex system had some mild narrowing at the iliac bifurcation and the leading edge of the stent proximally was poor wall apposition with stenosis in the 50-60% range in the right common iliac artery. There was also occlusion of the common femoral artery and the origin of the SFA and profunda femoris artery. Flow distally was poor but they appeared to be multiple areas of high-grade stenosis or occlusion in the distal SFA and popliteal artery with only a diseased peroneal artery. in another stent was placed and deployed. The femoral occlusion would need to be treated surgically. 08/27/17 on evaluation today patient appears to be doing about the same in regard to her left for extremity wound. Unfortunately secondary to her vascular status this is not a very well healing wound at this time. She has been tolerating the dressing changes she has some discomfort but mainly with cleansing of the wound not at other times. No fevers, chills, nausea, or vomiting noted at this time. 09/17/2017 -- the patient was recently discharged from the hospital where she was admitted between 09/08/2017 and 09/10/2017, when she had a procedure on her left femoral artery with the endarterectomy and a patch. she will be going back for review this coming week. 09/24/2017 -- was seen on 09/18/2017 in the ER, for bleeding from her wound which was debrided earlier during the day. At the time she was seen there was no active bleeding. A thrombin pad was placed over the area and she was asked to change the  outside dressing but come back to the wound center as planned. she was also seen at the [facility] office for a review after her surgery and bilateral lower extremity ABI when compared to the previous ones showed minimal improvement of arterial blood flow. She was set up for a CT angiogram of the abdomen and pelvis and lower extremities to see the degree of peripheral arterial disease 10/28/2017 -- had a x-ray of the left tibia and fibula done on 10/07/2017 -- showed no bony acute abnormality she was reviewed by Dr. Corene Cornea dew on 10/26/2017, and he reviewed her CT angiogram and this demonstrated a widely patent left femoral endarterectomy including the origins of the femoral artery. She also had moderate distal disease in the popliteal artery, distal SFA and tibial disease. Her peroneal artery is a dominant runoff distally.he felt she had adequate perfusion for healing the mid calf level and he is putting off all surgical intervention for now and is going to see her back in 2-3 months for noninvasive studies. She also has a new wound on the plantar aspect of the left big toe where she pulled a piece of skin and has caused a superficial ulceration 11/19/17 on evaluation today patient appears to be doing decently well at this point in time. It does appear that her Wound VAC has been approved as ordered by Dr. Con Memos however she has not received that as of yet. Nonetheless the wound bed in general appears to have minimal slough noted at this time in a fairly good amount of granulation although there is to be a noted in the center of the wound. No fevers, chills, nausea, or vomiting noted at this time. 12/02/17 on evaluation  today patient appears to be doing about the same in regard to her left anterior shin wound. Unfortunately her left toe wound appears to still be doing about the same as far as the eschar covering. This has softened up a bit to the point that it is now loose and  could have selective debridement which would allow for the Santyl to work better. Fortunately she is having no pain. Electronic Signature(s) Signed: 12/02/2017 6:11:24 PM By: Worthy Keeler PA-C Entered By: Worthy Keeler on 12/02/2017 14:45:44 [name], [name] ([id number]) -------------------------------------------------------------------------------- Physical Exam Details Patient Name: Alexa Lowes P. Date of Service: 12/02/2017 2:15 PM Medical Record Number: [id number] Patient Account Number: [id number] Date of Birth/Sex: 04-14-1926 (84 y.o. Female) Treating RN: Carolyne Fiscal, Debi Primary Care Provider: Lucianne Lei Other Clinician: Referring Provider: Lucianne Lei Treating Provider/Extender: STONE III, HOYT Weeks in Treatment: 30 Constitutional Well-nourished and well-hydrated in no acute distress. Respiratory normal breathing without difficulty. clear to auscultation bilaterally. Cardiovascular regular rate and rhythm with normal S1, S2. Psychiatric this patient is able to make decisions and demonstrates good insight into disease process. Alert and Oriented x 3. pleasant and cooperative. Notes Patient has thick eschar that is loose noted over the surface of the left great toe wound. This was selectively debride it today no bleeding occurred during the procedure and she tolerated this well. Only nonviable tissue was removed. Hopefully this will allow for better healing in regard to the Santyl being able to aid in cleaning off this wound. Electronic Signature(s) Signed: 12/02/2017 6:11:24 PM By: Worthy Keeler PA-C Entered By: Worthy Keeler on 12/02/2017 14:46:44 [name], [name] ([id number]) -------------------------------------------------------------------------------- Physician Orders Details Patient Name: Alexa Lowes P. Date of Service: 12/02/2017 2:15 PM Medical Record Number: [id number] Patient Account Number: [id number] Date of Birth/Sex: 12-17-1925 (84 y.o.  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 Wound #2 Left,Plantar Toe Great o Clean wound with Normal Saline. Wound #3 Left,Medial Toe Great o Clean wound with Normal Saline. Anesthetic (add to Medication List) Wound #1 Left,Anterior Lower Leg o Topical Lidocaine 4% cream applied to wound bed prior to debridement (In Clinic Only). Wound #2 Left,Plantar Toe Great o Topical Lidocaine 4% cream applied to wound bed prior to debridement (In Clinic Only). Wound #3 Left,Medial Toe Great o Topical Lidocaine 4% cream applied to wound bed prior to debridement (In Clinic Only). Primary Wound Dressing Wound #1 Left,Anterior Lower Leg o Silvercel Non-Adherent Wound #2 Left,Plantar Toe Great o Santyl Ointment Wound #3 Left,Medial Toe Great o [place]cel Non-Adherent Secondary Dressing Wound #1 Left,Anterior Lower Leg o Dry Gauze o Boardered Foam Dressing Wound #2 Left,Plantar Toe Great o Other - coverlett Ferrari, Beatrix P. ([id number]) Wound #3 Left,Medial Toe Great o Other - coverlett Dressing Change Frequency Wound #1 Left,Anterior Lower Leg o Change dressing every other day. Wound #2 Left,Plantar Toe Great o Change dressing every day. Wound #3 Left,Medial Toe Great o Change dressing every day. Follow-up Appointments o Return Appointment in 1 week. Off-Loading Wound #2 Left,Plantar Toe  Great o  Open toe surgical shoe with peg assist. Additional Orders / Instructions Wound #1 Left,Anterior Lower Leg o Increase protein intake. Wound #2 Left,Plantar Toe Great o Increase protein intake. Wound #3 Left,Medial Toe Great o Increase protein intake. Patient Medications Allergies: No Known Drug Allergies Notifications Medication Indication Start End lidocaine DOSE 1 - topical 4 % cream - 1 cream topical Electronic Signature(s) Signed: 12/02/2017 4:31:09 PM By: Alric Quan Signed: 12/02/2017 6:11:24 PM By: Worthy Keeler PA-C Entered By: Alric Quan on 12/02/2017 15:15:12 [name], [name] ([id number]) -------------------------------------------------------------------------------- Prescription 12/02/2017 Patient Name: Alexa Lowes P. Provider: Worthy Keeler PA-C Date of Birth: 21-Sep-1926 NPI#: [id number] Sex: F DEA#: [id number] Phone #: [phone] License #: Patient Address: [facility] [place], [place] [address] [address], [place], [place] [address] [phone] Allergies No Known Drug Allergies Medication Medication: Route: Strength: Form: lidocaine 4 % topical cream topical 4% cream Class: TOPICAL LOCAL ANESTHETICS Dose: Frequency / Time: Indication: 1 1 cream topical Number of Refills: Number of Units: 0 Generic Substitution: Start Date: End Date: One Time Use: Substitution Permitted No Note to Pharmacy: Signature(s): Date(s): Electronic Signature(s) Signed: 12/02/2017 4:31:09 PM By: Alric Quan Signed: 12/02/2017 6:11:24 PM By: Worthy Keeler PA-C Entered By: Alric Quan on 12/02/2017 15:15:21 Constante, Kleo P. ([id number]) --------------------------------------------------------------------------------  Problem List Details Patient Name: Alexa Lowes P. Date of Service: 12/02/2017 2:15 PM Medical Record Number:  [id number] Patient Account Number: [id number] Date of Birth/Sex: 1925-12-12 (84 y.o. 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 ([id number]) -------------------------------------------------------------------------------- Progress Note Details Patient Name: Alexa Lowes P. Date of Service: 12/02/2017 2:15 PM Medical Record Number: [id number] Patient Account Number: [id number] Date of Birth/Sex: 1926-08-21 (84 y.o. Female) Treating RN: Carolyne Fiscal, Debi Primary Care Provider: Lucianne Lei Other Clinician: Referring Provider: Lucianne Lei Treating Provider/Extender: Melburn Hake, HOYT Weeks in Treatment: 26 Subjective Chief Complaint Information obtained from Patient Patients presents for treatment of an open diabetic ulcer 2 the left lower anterior leg which she sustained an injury by blunt trauma History of Present Illness (HPI) The following HPI elements were documented for the patient's wound: Location: left anterior shin Quality: Patient reports experiencing a dull pain to affected area(s). Severity: Patient states wound are getting worse. Duration: Patient has had the wound for < 3 weeks prior to presenting for treatment Timing: Pain in wound is Intermittent (comes and goes Context: The wound occurred when the patient blunt trauma against  her left shin Modifying Factors: Other treatment(s) tried include:active Bactroban ointment locally Associated Signs and Symptoms: Patient reports having: occasional discharge from the wound 84 year old patient was seen in the ED about 10 days ago for a history of abrasion to the left lower extremity while she was boarding a bus and scraped the anterior part of her left leg. She has been a diabetic for many years and has been taking treatment regularly. Her past medical history is also significant for anemia arthritis  constipation and hypertension and is status post abdominal hysterectomy. She has never been a smoker. after her ER visit she was advised to apply Bactroban ointment to the wound twice a day and continue to monitor her blood glucose levels. her last hemoglobin A1c was done 3 years ago and was 6.6% 06/04/17 patient left anterior shin wound appears to be doing well although it is still somewhat dry despite the treatment with Medihoney. We have been using Kerlex following the Medihoney application and I believe this is just not retaining that much moisture. Nonetheless there is no evidence of infection. 06/11/17 on evaluation today patient appears to be doing a little bit worse in regard to her wound. The entirety of the wound is dry and unfortunately the Medihoney does not seem to be helping this. That is even with the dressing changes that I made last week to try to retain more moisture. She has not tried [employer] as of yet. She was also noncompressible when testing for ABIs. 07/01/2017 -- she had a arterial studies done and was seen by Dr. Lucky Cowboy. her noninvasive studies showed noncompressible vessels on the right but brisk waveforms and digital pressures of 71 on the right consistent with only mild arterial insufficienc. On the left her ABI was 0.47 and this may be falsely elevated due to calcification. No digital pressures were obtained on the left and this is consistent with severe  arterial insufficiency. this critical limb threatening situation on the left, would make wound healing difficult and it represents a serious situation and he has recommended an angiography with possible revascularization. The patient desired to defer the decision to she discuss with the family members. 07/09/17 on evaluation today patient appears to be doing fairly well in regard to her left anterior lower extremity wound. She has been tolerating the dressing changes without complication we have been utilizing Santyl at this time. She tells me that she is having no significant pain compared to what she has had in the past. She has decided after discussing with family that she is going to go forward with the surgery with Dr. dew. This is to restore better blood flow to left lower extremity. With that it does appear that her lower extremity wound is making some progress albeit slow. No fevers, chills, nausea, or vomiting noted at this time. [name], [name] ([id number]) 07/16/17 on evaluation today patient's wound appears to be doing roughly about the same although some of the slough is clearing off. [name] she has her appointment with vascular next week on Thursday the 23rd. With that being said her wound does not appear to be hurting as badly as it has in the past which is good news. No fevers, chills, nausea, or vomiting noted at this time. 07/30/2017 -- she had surgery on 07/22/2017 --indications being nonhealing ulcer on the left leg with a noninvasive study showing marked reduction in ABI of less than 0.5 and no digital pressure on her left leg. she had a percutaneous transluminal angioplasty of the left common and external iliac arteries. She then had a stent placed to the left external iliac arteries and to the left common iliac artery. 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 The flow distally was poor  and there were multiple areas of high-grade stenosis or occlusion of the distal SFA and the popliteal artery with only a diseased peroneal artery as runoff distally. She has a postop  appointment on September 23 08/20/2017 -- she went back to the vascular office for an appointment on 08/12/2017 and bilateral ABIs were done which were notable for moderate right lower extremity arterial disease and unable to obtain left ankle brachial indices due to absent Doppler velocities in the posterior tibial artery and the anterior tibial artery. Monophasic waveforms to the left femoral and popliteal artery. this was worrisome for a failure of stents placed and lower extremity angiogram was recommended. as was done on 08/16/2017 and the stents placed in the left eye Lex system had some mild narrowing at the iliac bifurcation and the leading edge of the stent proximally was poor wall apposition with stenosis in the 50-60% range in the right common iliac artery. There was also occlusion of the common femoral artery and the origin of the SFA and profunda femoris artery. Flow distally was poor but they appeared to be multiple areas of high-grade stenosis or occlusion in the distal SFA and popliteal artery with only a diseased peroneal artery. in another stent was placed and deployed. The femoral occlusion would need to be treated surgically. 08/27/17 on evaluation today patient appears to be doing about the same in regard to her left for extremity wound. Unfortunately secondary to her vascular status this is not a very well healing wound at this time. She has been tolerating the dressing changes she has some discomfort but mainly with cleansing of the wound not at other times. No fevers, chills, nausea, or vomiting noted at this time. 09/17/2017 -- the patient was recently discharged from the hospital where she was admitted between 09/08/2017 and 09/10/2017, when she had a procedure on her left femoral artery with  the endarterectomy and a patch. she will be going back for review this coming week. 09/24/2017 -- was seen on 09/18/2017 in the ER, for bleeding from her wound which was debrided earlier during the day. At the time she was seen there was no active bleeding. A thrombin pad was placed over the area and she was asked to change the outside dressing but come back to the wound center as planned. she was also seen at the [facility] for a review after her surgery and bilateral lower extremity ABI when compared to the previous ones showed minimal improvement of arterial blood flow. She was set up for a CT angiogram of the abdomen and pelvis and lower extremities to see the degree of peripheral arterial disease 10/28/2017 -- had a x-ray of the left tibia and fibula done on 10/07/2017 -- showed no bony acute abnormality she was reviewed by Dr. Corene Cornea dew on 10/26/2017, and he reviewed her CT angiogram and this demonstrated a widely patent left femoral endarterectomy including the origins of the femoral artery. She also had moderate distal disease in the popliteal artery, distal SFA and tibial disease. Her peroneal artery is a dominant runoff distally.he felt she had adequate perfusion for healing the mid calf level and he is putting off all surgical intervention for now and is going to see her back in 2-3 months for noninvasive studies. She also has a new wound on the plantar aspect of the left big toe where she pulled a piece of skin and has caused a superficial ulceration 11/19/17 on evaluation today patient appears to be doing decently well at this point in time. It does appear that her Wound VAC has been approved as ordered by Dr. Con Memos however she has not received that as of yet. Nonetheless  the wound bed in general appears to have minimal slough noted at this time in a fairly good amount of granulation although there is to be a noted in the center of the wound. No fevers,  chills, nausea, or vomiting noted at this time. 12/02/17 on evaluation today patient appears to be doing about the same in regard to her left anterior shin wound. Unfortunately her left toe wound appears to still be doing about the same as far as the eschar covering. This has softened up a bit to the point that it is now loose and could have selective debridement which would allow for the Santyl to work better. Fortunately she is having no pain. [name], [name] ([id number]) Patient History Information obtained from Patient. Social History Never smoker, Marital Status - Separated, Alcohol Use - Never, Drug Use - No History, Caffeine Use - Rarely. Review of Systems (ROS) Constitutional Symptoms (General Health) Denies complaints or symptoms of Fever, Chills. Respiratory The patient has no complaints or symptoms. Cardiovascular The patient has no complaints or symptoms. Psychiatric The patient has no complaints or symptoms. Objective Constitutional Well-nourished and well-hydrated in no acute distress. Vitals Time Taken: 2:04 PM, Height: 65 in, Weight: 131 lbs, BMI: 21.8, Temperature: 97.8 F, Pulse: 96 bpm, Respiratory Rate: 16 breaths/min, Blood Pressure: 147/69 mmHg. Respiratory normal breathing without difficulty. clear to auscultation bilaterally. Cardiovascular regular rate and rhythm with normal S1, S2. Psychiatric this patient is able to make decisions and demonstrates good insight into disease process. Alert and Oriented x 3. pleasant and cooperative. General Notes: Patient has thick eschar that is loose noted over the surface of the left great toe wound. This was selectively debride it today no bleeding occurred during the procedure and she tolerated this well. Only nonviable tissue was removed. Hopefully this will allow for better healing in regard to the Santyl being able to aid in cleaning off this wound. Integumentary (Hair, Skin) Wound #1 status is Open. Original  cause of wound was Trauma. The wound is located on the Left,Anterior Lower Leg. The wound measures 7.5cm length x 3.4cm width x 0.2cm depth; 20.028cm^2 area and 4.006cm^3 volume. There is bone, Fat Layer (Subcutaneous Tissue) Exposed, and fascia exposed. There is no tunneling or undermining noted. There is a large amount of serous drainage noted. The wound margin is flat and intact. There is medium (34-66%) red granulation within the wound bed. There is a medium (34-66%) amount of necrotic tissue within the wound bed including Adherent Slough. The periwound skin appearance exhibited: Scarring, Maceration, Hemosiderin Staining, Erythema. The periwound skin appearance did not exhibit: Callus, Crepitus, Excoriation, Induration, Rash, Dry/Scaly, Atrophie Blanche, Cyanosis, Ecchymosis, Mottled, Pallor, Rubor. The surrounding wound skin color is noted with erythema which is circumferential. Periwound temperature was noted as No Abnormality. The periwound has tenderness on palpation. [name], [name] ([id number]) Wound #2 status is Open. Original cause of wound was Trauma. The wound is located on the Left Toe Great. The wound measures 1cm length x 1cm width x 0.1cm depth; 0.785cm^2 area and 0.079cm^3 volume. There is no tunneling or undermining noted. There is a large amount of serous drainage noted. The wound margin is flat and intact. There is no granulation within the wound bed. There is a large (67-100%) amount of necrotic tissue within the wound bed including Eschar and Adherent Slough. The periwound skin appearance did not exhibit: Callus, Crepitus, Excoriation, Induration, Rash, Scarring, Dry/Scaly, Maceration, Atrophie Blanche, Cyanosis, Ecchymosis, Hemosiderin Staining, Mottled, Pallor, Rubor, Erythema. Periwound temperature was noted  as No Abnormality. Wound #3 status is Open. Original cause of wound was Gradually Appeared. The wound is located on the [employer]. The wound measures  0.6cm length x 0.3cm width x 0.1cm depth; 0.141cm^2 area and 0.014cm^3 volume. There is no tunneling or undermining noted. There is a large amount of serosanguineous drainage noted. The wound margin is distinct with the outline attached to the wound base. There is large (67-100%) red granulation within the wound bed. There is no necrotic tissue within the wound bed. Periwound temperature was noted as No Abnormality. The periwound has tenderness on palpation. 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 There was a Non-Viable Tissue Open Wound/Selective [phone]) debridement with total area of 1 sq cm performed by STONE III, HOYT E., PA-C. with the following instrument(s): Forceps and Scissors to remove Non-Viable tissue/material including Exudate and Fibrin/Slough after achieving pain control using Lidocaine 4% Topical Solution. A time out was conducted at 14:40, prior to the start of the procedure. A Minimum amount of bleeding was controlled with Pressure. The procedure was tolerated well with a pain level of 0 throughout and a pain level of 0 following the procedure. Post Debridement Measurements: 1cm length x 1cm width x 0.2cm depth; 0.157cm^3 volume. Character of Wound/Ulcer Post Debridement requires further debridement. Severity of Tissue Post Debridement is: Fat layer exposed. Post procedure Diagnosis Wound #2: Same as Pre-Procedure Plan Wound Cleansing: Wound #1 Left,Anterior Lower Leg: Clean wound with Normal Saline. Wound #2 Left,Plantar Toe GreatAVEREIGH, SPAINHOWER ([id number]) Clean wound  with Normal Saline. Wound #3 Left,Medial Toe Great: Clean wound with Normal Saline. Anesthetic (add to Medication List): Wound #1 Left,Anterior Lower Leg: Topical Lidocaine 4% cream applied to wound bed prior to debridement (In Clinic Only). Wound #2 Left,Plantar Toe Great: Topical Lidocaine 4% cream applied to wound bed prior to debridement (In Clinic Only). Wound #3 Left,Medial Toe Great: Topical Lidocaine 4% cream applied to wound bed prior to debridement (In Clinic Only). Primary Wound Dressing: Wound #1 Left,Anterior Lower Leg: Silvercel Non-Adherent Wound #2 Left,Plantar Toe Great: Santyl Ointment Wound #3 Left,Medial Toe Great: [place] Non-Adherent Secondary Dressing: Wound #1 Left,Anterior Lower Leg: Dry Gauze Boardered Foam Dressing Wound #2 Left,Plantar Toe Great: Other - coverlett Wound #3 Left,Medial Toe Great: Other - coverlett Dressing Change Frequency: Wound #1 Left,Anterior Lower Leg: Change dressing every other day. Wound #2 Left,Plantar Toe Great: Change dressing every day. Wound #3 Left,Medial Toe Great: Change dressing every day. Follow-up Appointments: Return Appointment in 1 week. Off-Loading: Wound #2 Left,Plantar Toe Great: Open toe surgical shoe with peg assist. Additional Orders / Instructions: Wound #1 Left,Anterior Lower Leg: Increase protein intake. Wound #2 Left,Plantar Toe Great: Increase protein intake. Wound #3 Left,Medial Toe Great: Increase protein intake. The following medication(s) was prescribed: lidocaine topical 4 % cream 1 1 cream topical I'm going to recommend that we continue with the Current wound care measures for the time being. Hopefully the Santyl will be able to work even better at this point. We are still waiting the Wound VAC approval. Please see above for specific wound care orders. We will see patient for re-evaluation in 1 week(s) here in  the clinic. If anything worsens or changes patient will contact our office  for additional recommendations. [name], [name] ([id number]) Electronic Signature(s) Signed: 12/17/2017 9:59:50 AM By: Gretta Cool, BSN, RN, CWS, Kim RN, BSN Signed: 12/22/2017 7:57:29 AM By: Worthy Keeler PA-C Previous Signature: 12/02/2017 6:11:24 PM Version By: Worthy Keeler PA-C Entered By: Gretta Cool BSN, RN, CWS, Kim on 12/17/2017 09:59:49 [place], [name] ([id number]) -------------------------------------------------------------------------------- ROS/PFSH Details Patient Name: Alexa Lowes P. Date of Service: 12/02/2017 2:15 PM Medical Record Number: [id number] Patient Account Number: [id number] Date of Birth/Sex: September 20, 1926 (84 y.o. Female) Treating RN: Carolyne Fiscal, Debi Primary Care Provider: Lucianne Lei Other Clinician: Referring Provider: Lucianne Lei Treating Provider/Extender: Melburn Hake, HOYT Weeks in Treatment: 26 Information Obtained From Patient Wound History Do you currently have one or more open woundso Yes How many open wounds do you currently haveo 1 Approximately how long have you had your woundso 3 weeks How have you been treating your wound(s) until nowo open to air Has your wound(s) ever healed and then re-openedo No Have you had any lab work done in the past montho No Have you tested positive for an antibiotic resistant organism (MRSA, VRE)o No Have you tested positive for osteomyelitis (bone infection)o No Have you had any tests for circulation on your legso No Constitutional Symptoms (General Health) Complaints and Symptoms: Negative for: Fever; Chills Hematologic/Lymphatic Medical History: Positive for: Anemia Respiratory Complaints and Symptoms: No Complaints or Symptoms Cardiovascular Complaints and Symptoms: No Complaints or Symptoms Medical History: Positive for: Hypertension Endocrine Medical History: Positive for: Type II Diabetes Treated with: Oral agents Musculoskeletal Medical History: Positive for:  Osteoarthritis Oncologic [name], [name] ([id number]) Medical History: Negative for: Received Chemotherapy; Received Radiation Psychiatric Complaints and Symptoms: No Complaints or Symptoms Immunizations Pneumococcal Vaccine: Received Pneumococcal Vaccination: Yes Immunization Notes: up to date Implantable Devices Family and Social History Never smoker; Marital Status - Separated; Alcohol Use: Never; Drug Use: No History; Caffeine Use: Rarely; Financial Concerns: No; Food, Clothing or Shelter Needs: No; Support System Lacking: No; Transportation Concerns: No; Advanced Directives: No; Patient does not want information on Advanced Directives Physician Affirmation I have reviewed and agree with the above information. Electronic Signature(s) Signed: 12/02/2017 4:31:09 PM By: Alric Quan Signed: 12/02/2017 6:11:24 PM By: Worthy Keeler PA-C Entered By: Worthy Keeler on 12/02/2017 14:46:08 [name], [name] ([id number]) -------------------------------------------------------------------------------- SuperBill Details Patient Name: Alexa Lowes P. Date of Service: 12/02/2017 Medical Record Number: [id number] Patient Account Number: [id number] Date of Birth/Sex: June 12, 1926 (84 y.o. 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

## 2017-12-10 ENCOUNTER — Encounter: Payer: Medicare Other | Admitting: Physician Assistant

## 2017-12-10 DIAGNOSIS — E11622 Type 2 diabetes mellitus with other skin ulcer: Secondary | ICD-10-CM | POA: Diagnosis not present

## 2017-12-12 NOTE — Progress Notes (Signed)
[name], [name] ([id number]) Visit Report for 12/10/2017 Arrival Information Details Patient Name: [name], [name]. Date of Service: 12/10/2017 2:15 PM Medical Record Number: [id number] Patient Account Number: [id number] Date of Birth/Sex: 1926/10/22 (84 y.o. Female) Treating RN: Carolyne Fiscal, Debi Primary Care Remy Dia: Lucianne Lei Other Clinician: Referring Enes [name]: Lucianne Lei Treating Elizeth Weinrich/Extender: Melburn Hake, HOYT Weeks in Treatment: 28 Visit Information History Since Last Visit All ordered tests and consults were completed: No Patient Arrived: Cane Added or deleted any medications: No Arrival Time: 14:48 Any new allergies or adverse reactions: No Accompanied By: granddaughter Had a fall or experienced change in No Transfer Assistance: EasyPivot Patient activities of daily living that may affect Lift risk of falls: Patient Identification Verified: Yes Signs or symptoms of abuse/neglect since last visito No Secondary Verification Process Yes Hospitalized since last visit: No Completed: Has Dressing in Place as Prescribed: Yes Patient Requires Transmission-Based No Precautions: Pain Present Now: No Patient Has Alerts: Yes Electronic Signature(s) Signed: 12/10/2017 4:08:09 PM By: Alric Quan Entered By: Alric Quan on 12/10/2017 14:50:47 Coote, [name] ([id number]) -------------------------------------------------------------------------------- Encounter Discharge Information Details Patient Name: [name]. Date of Service: 12/10/2017 2:15 PM Medical Record Number: [id number] Patient Account Number: [id number] Date of Birth/Sex: 1926/08/07 (84 y.o. Female) Treating RN: Carolyne Fiscal, Debi Primary Care Bresha Hosack: Lucianne Lei Other Clinician: Referring Rayyan Burley: Lucianne Lei Treating Jarae Nemmers/Extender: Melburn Hake, HOYT Weeks in Treatment: 28 Encounter Discharge Information Items Discharge Pain Level: 0 Discharge Condition: Stable Ambulatory  Status: Cane Discharge Destination: Home Transportation: Private Auto Accompanied By: granddaughter Schedule Follow-up Appointment: Yes Medication Reconciliation completed and No provided to Patient/Care Vannessa Godown: Provided on Clinical Summary of Care: 12/10/2017 Form Type Recipient Paper Patient Franklin Signature(s) Signed: 12/10/2017 3:52:44 PM By: Alric Quan Entered By: Alric Quan on 12/10/2017 15:52:44 Like, [name] ([id number]) -------------------------------------------------------------------------------- Lower Extremity Assessment Details Patient Name: [name]. Date of Service: 12/10/2017 2:15 PM Medical Record Number: [id number] Patient Account Number: [id number] Date of Birth/Sex: 11-15-1926 (84 y.o. Female) Treating RN: Carolyne Fiscal, Debi Primary Care Chauntelle Azpeitia: Lucianne Lei Other Clinician: Referring Kimberly Coye: Lucianne Lei Treating Nels Munn/Extender: Melburn Hake, HOYT Weeks in Treatment: 28 Vascular Assessment Pulses: Dorsalis Pedis Palpable: [Left:Yes] Posterior Tibial Extremity colors, hair growth, and conditions: Extremity Color: [Left:Hyperpigmented] Temperature of Extremity: [Left:Warm] Capillary Refill: [Left:< 3 seconds] Toe Nail Assessment Left: Right: Thick: Yes Discolored: Yes Deformed: No Electronic Signature(s) Signed: 12/10/2017 4:08:09 PM By: Alric Quan Entered By: Alric Quan on 12/10/2017 15:04:36 Califf, DPOEUMPN [name]. ([id number]) -------------------------------------------------------------------------------- Multi Wound Chart Details Patient Name: [name]. Date of Service: 12/10/2017 2:15 PM Medical Record Number: [id number] Patient Account Number: [id number] Date of Birth/Sex: 13-Jul-1926 (84 y.o. Female) Treating RN: Carolyne Fiscal, Debi Primary Care Mihail Prettyman: Lucianne Lei Other Clinician: Referring Johan Creveling: Lucianne Lei Treating Eulises Kijowski/Extender: Melburn Hake, HOYT Weeks in Treatment: 28 Vital  Signs Height(in): 65 Pulse(bpm): 51 Weight(lbs): 131 Blood Pressure(mmHg): 138/65 Body Mass Index(BMI): 22 Temperature(F): Respiratory Rate 16 (breaths/min): Photos: [1:No Photos] [2:No Photos] [3:No Photos] Wound Location: [1:Left Lower Leg - Anterior] [2:Left Toe Great - Plantar] [3:Left Toe Great - Medial] Wounding Event: [1:Trauma] [2:Trauma] [3:Gradually Appeared] 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] [3:Skin Tear] Secondary Etiology: [1:Trauma, Other] [2:N/A] [3:N/A] Comorbid History: [1:Anemia, Hypertension, Type II Diabetes, Osteoarthritis] [2:Anemia, Hypertension, Type II Diabetes, Osteoarthritis] [3:Anemia, Hypertension, Type II Diabetes, Osteoarthritis] Date Acquired: [1:05/02/2017] [2:10/28/2017] [3:12/02/2017] Weeks of Treatment: [1:28] [2:6] [3:1] Wound Status: [1:Open] [2:Open] [3:Healed - Epithelialized] Measurements L x W x D [1:7.6x3.2x0.2] [2:1.1x1.2x0.2] [3:0x0x0] (cm) Area (cm) : [1:19.101] [2:1.037] [3:0]  Volume (cm) : [1:3.82] [2:0.207] [3:0] % Reduction in Area: [1:-297.40%] [2:-371.40%] [3:100.00%] % Reduction in Volume: [1:-297.50%] [2:-840.90%] [3:100.00%] Starting Position 1 [2:12] (o'clock): Ending Position 1 [2:12] (o'clock): Maximum Distance 1 (cm): [2:0.3] Undermining: [1:No] [2:Yes] [3:No] Classification: [1:Grade 2] [2:Full Thickness Without Exposed Support Structures] [3:Partial Thickness] Exudate Amount: [1:Large] [2:Large] [3:Large] Exudate Type: [1:Serous] [2:Serous] [3:Serosanguineous] Exudate Color: [1:amber] [2:amber] [3:red, brown] Wound Margin: [1:Flat and Intact] [2:Flat and Intact] [3:Distinct, outline attached] Granulation Amount: [1:Medium (34-66%)] [2:None Present (0%)] [3:None Present (0%)] Granulation Quality: [1:Red] [2:N/A] [3:N/A] Necrotic Amount: [1:Medium (34-66%)] [2:Large (67-100%)] [3:None Present (0%)] Necrotic Tissue: [1:Adherent Slough] [2:Eschar, Adherent  Slough] [3:N/A] Exposed Structures: [1:Fascia: Yes Fat Layer (Subcutaneous Tissue) Exposed: Yes Bone: Yes] [2:Fascia: No Fat Layer (Subcutaneous Tissue) Exposed: No Tendon: No] [3:Fascia: No Fat Layer (Subcutaneous Tissue) Exposed: No Tendon: No] Tendon: No Muscle: No Muscle: No Muscle: No Joint: No Joint: No Joint: No Bone: No Bone: No Epithelialization: None None Large (67-100%) Periwound Skin Texture: Scarring: Yes Excoriation: No No Abnormalities Noted Excoriation: No Induration: No Induration: No Callus: No Callus: No Crepitus: No Crepitus: No Rash: No Rash: No Scarring: No Periwound Skin Moisture: Maceration: Yes Maceration: No No Abnormalities Noted Dry/Scaly: No Dry/Scaly: No Periwound Skin Color: Erythema: Yes Atrophie Blanche: No No Abnormalities Noted Hemosiderin Staining: Yes Cyanosis: No Atrophie Blanche: No Ecchymosis: No Cyanosis: No Erythema: No Ecchymosis: No Hemosiderin Staining: No Mottled: No Mottled: No Pallor: No Pallor: No Rubor: No Rubor: No Erythema Location: Circumferential N/A N/A Temperature: No Abnormality No Abnormality No Abnormality Tenderness on Palpation: Yes No Yes Wound Preparation: Ulcer Cleansing: Ulcer Cleansing: Ulcer Cleansing: Rinsed/Irrigated with Saline Rinsed/Irrigated with Saline Rinsed/Irrigated with Saline Topical Anesthetic Applied: Topical Anesthetic Applied: Topical Anesthetic Applied: Other: lidocaine 4% Other: lidocaine 4% None Treatment Notes Electronic Signature(s) Signed: 12/10/2017 4:08:09 PM By: Alric Quan Entered By: Alric Quan on 12/10/2017 15:07:07 Cozad, [name] ([id number]) -------------------------------------------------------------------------------- Multi-Disciplinary Care Plan Details Patient Name: [name]. Date of Service: 12/10/2017 2:15 PM Medical Record Number: [id number] Patient Account Number: [id number] Date of Birth/Sex: 1926/07/15 (84 y.o.  Female) Treating RN: Carolyne Fiscal, Debi Primary Care Rebel Willcutt: Lucianne Lei Other Clinician: Referring Jaymere Alen: Lucianne Lei Treating Kenya Shiraishi/Extender: Melburn Hake, HOYT Weeks in Treatment: 28 Active Inactive ` Abuse / Safety / Falls / Self Care Management Nursing Diagnoses: Potential for falls Goals: Patient will remain injury free related to falls Date Initiated: 05/28/2017 Target Resolution Date: 07/23/2017 Goal Status: Active Interventions: Assess fall risk on admission and as needed Notes: ` Orientation to the Wound Care Program Nursing Diagnoses: Knowledge deficit related to the wound healing center program Goals: Patient/caregiver will verbalize understanding of the [facility] Program Date Initiated: 05/28/2017 Target Resolution Date: 07/23/2017 Goal Status: Active Interventions: Provide education on orientation to the wound center Notes: ` Wound/Skin Impairment Nursing Diagnoses: Impaired tissue integrity Goals: Ulcer/skin breakdown will have a volume reduction of 30% by week 4 Date Initiated: 05/28/2017 Target Resolution Date: 07/23/2017 Goal Status: Active Ulcer/skin breakdown will have a volume reduction of 50% by week 8 Date Initiated: 05/28/2017 Target Resolution Date: 07/23/2017 [name], [name] ([id number]) Goal Status: Active Ulcer/skin breakdown will have a volume reduction of 80% by week 12 Date Initiated: 05/28/2017 Target Resolution Date: 07/23/2017 Goal Status: Active Ulcer/skin breakdown will heal within 14 weeks Date Initiated: 05/28/2017 Target Resolution Date: 07/23/2017 Goal Status: Active Interventions: Assess patient/caregiver ability to obtain necessary supplies Assess patient/caregiver ability to perform ulcer/skin care regimen upon admission and as needed Assess ulceration(s) every visit Notes: Electronic Signature(s) Signed:  12/10/2017 4:08:09 PM By: Alric Quan Entered By: Alric Quan on 12/10/2017 15:06:47 Kuenzel,  [name] ([id number]) -------------------------------------------------------------------------------- Pain Assessment Details Patient Name: [name]. Date of Service: 12/10/2017 2:15 PM Medical Record Number: [id number] Patient Account Number: [id number] Date of Birth/Sex: 07/28/1926 (84 y.o. Female) Treating RN: Carolyne Fiscal, Debi Primary Care Burris Matherne: Lucianne Lei Other Clinician: Referring Jaiceon Collister: Lucianne Lei Treating Everley Evora/Extender: Melburn Hake, HOYT Weeks in Treatment: 28 Active Problems Location of Pain Severity and Description of Pain Patient Has Paino No Site Locations Pain Management and Medication Current Pain Management: Electronic Signature(s) Signed: 12/10/2017 4:08:09 PM By: Alric Quan Entered By: Alric Quan on 12/10/2017 14:50:54 Filyaw, [name] ([id number]) -------------------------------------------------------------------------------- Patient/Caregiver Education Details Patient Name: [name]. Date of Service: 12/10/2017 2:15 PM Medical Record Number: [id number] Patient Account Number: [id number] Date of Birth/Gender: 09-11-1926 (84 y.o. Female) Treating RN: Carolyne Fiscal, Debi Primary Care Physician: Lucianne Lei Other Clinician: Referring Physician: Lucianne Lei Treating Physician/Extender: Sharalyn Ink in Treatment: 28 Education Assessment Education Provided To: Patient and Caregiver granddaughter Education Topics Provided Wound/Skin Impairment: Handouts: Caring for Your Ulcer, Other: change dressing as ordered Methods: Demonstration, Explain/Verbal Responses: State content correctly Electronic Signature(s) Signed: 12/10/2017 4:08:09 PM By: Alric Quan Entered By: Alric Quan on 12/10/2017 15:53:07 Gartin, SFKCLEXN [name]. ([id number]) -------------------------------------------------------------------------------- Wound Assessment Details Patient Name: [name]. Date of Service: 12/10/2017 2:15  PM Medical Record Number: [id number] Patient Account Number: [id number] Date of Birth/Sex: Jan 26, 1926 (84 y.o. Female) Treating RN: Carolyne Fiscal, Debi Primary Care Merion Grimaldo: Lucianne Lei Other Clinician: Referring Azoria Abbett: Lucianne Lei Treating Dayln Tugwell/Extender: Melburn Hake, HOYT Weeks in Treatment: 28 Wound Status Wound Number: 1 Primary Etiology: Diabetic Wound/Ulcer of the Lower Extremity Wound Location: Left Lower Leg - Anterior Secondary Trauma, Other Wounding Event: Trauma Etiology: Date Acquired: 05/02/2017 Wound Status: Open Weeks Of Treatment: 28 Comorbid Anemia, Hypertension, Type II Diabetes, Clustered Wound: No History: Osteoarthritis Photos Photo Uploaded By: Alric Quan on 12/10/2017 17:20:31 Wound Measurements Length: (cm) 7.6 Width: (cm) 3.2 Depth: (cm) 0.2 Area: (cm) 19.101 Volume: (cm) 3.82 % Reduction in Area: -297.4% % Reduction in Volume: -297.5% Epithelialization: None Tunneling: No Undermining: No Wound Description Classification: Grade 2 Wound Margin: Flat and Intact Exudate Amount: Large Exudate Type: Serous Exudate Color: amber Foul Odor After Cleansing: No Slough/Fibrino Yes Wound Bed Granulation Amount: Medium (34-66%) Exposed Structure Granulation Quality: Red Fascia Exposed: Yes Necrotic Amount: Medium (34-66%) Fat Layer (Subcutaneous Tissue) Exposed: Yes Necrotic Quality: Adherent Slough Tendon Exposed: No Muscle Exposed: No Joint Exposed: No Bone Exposed: Yes Periwound Skin Texture Rusher, Thirza [name]. ([id number]) Texture Color No Abnormalities Noted: No No Abnormalities Noted: No Callus: No Atrophie Blanche: No Crepitus: No Cyanosis: No Excoriation: No Ecchymosis: No Induration: No Erythema: Yes Rash: No Erythema Location: Circumferential Scarring: Yes Hemosiderin Staining: Yes Mottled: No Moisture Pallor: No No Abnormalities Noted: No Rubor: No Dry / Scaly: No Maceration: Yes Temperature /  Pain Temperature: No Abnormality Tenderness on Palpation: Yes Wound Preparation Ulcer Cleansing: Rinsed/Irrigated with Saline Topical Anesthetic Applied: Other: lidocaine 4%, Treatment Notes Wound #1 (Left, Anterior Lower Leg) 1. Cleansed with: Clean wound with Normal Saline 2. Anesthetic Topical Lidocaine 4% cream to wound bed prior to debridement 3. Peri-wound Care: Skin Prep 4. Dressing Applied: Other dressing (specify in notes) 5. Secondary Dressing Applied Bordered Foam Dressing Notes silvercel, stretch netting #4 Electronic Signature(s) Signed: 12/10/2017 4:08:09 PM By: Alric Quan Entered By: Alric Quan on 12/10/2017 14:59:56 Luger, Ramia [name]. ([id number]) -------------------------------------------------------------------------------- Wound Assessment Details Patient Name: [name]. Date of  Service: 12/10/2017 2:15 PM Medical Record Number: [id number] Patient Account Number: [id number] Date of Birth/Sex: [birth year] (84 y.o. Female) Treating RN: Carolyne Fiscal, Debi Primary Care Loreal Schuessler: Lucianne Lei Other Clinician: Referring Layli Capshaw: Lucianne Lei Treating Nakyra Bourn/Extender: Melburn Hake, HOYT Weeks in Treatment: 28 Wound Status Wound Number: 2 Primary Arterial Insufficiency Ulcer Etiology: Wound Location: Left Toe Great - Plantar Wound Status: Open Wounding Event: Trauma Comorbid Anemia, Hypertension, Type II Diabetes, Date Acquired: 10/28/2017 History: Osteoarthritis Weeks Of Treatment: 6 Clustered Wound: No Photos Photo Uploaded By: Alric Quan on 12/10/2017 17:20:54 Wound Measurements Length: (cm) 1.1 Width: (cm) 1.2 Depth: (cm) 0.2 Area: (cm) 1.037 Volume: (cm) 0.207 % Reduction in Area: -371.4% % Reduction in Volume: -840.9% Epithelialization: None Tunneling: No Undermining: Yes Starting Position (o'clock): 12 Ending Position (o'clock): 12 Maximum Distance: (cm) 0.3 Wound Description Full Thickness Without Exposed  Support Classification: Structures Wound Margin: Flat and Intact Exudate Large Amount: Exudate Type: Serous Exudate Color: amber Foul Odor After Cleansing: No Slough/Fibrino Yes Wound Bed Granulation Amount: None Present (0%) Exposed Structure Necrotic Amount: Large (67-100%) Fascia Exposed: No Necrotic Quality: Eschar, Adherent Slough Fat Layer (Subcutaneous Tissue) Exposed: No Castrillon, Kenzi [name]. ([id number]) Tendon Exposed: No Muscle Exposed: No Joint Exposed: No Bone Exposed: No Periwound Skin Texture Texture Color No Abnormalities Noted: No No Abnormalities Noted: No Callus: No Atrophie Blanche: No Crepitus: No Cyanosis: No Excoriation: No Ecchymosis: No Induration: No Erythema: No Rash: No Hemosiderin Staining: No Scarring: No Mottled: No Pallor: No Moisture Rubor: No No Abnormalities Noted: No Dry / Scaly: No Temperature / Pain Maceration: No Temperature: No Abnormality Wound Preparation Ulcer Cleansing: Rinsed/Irrigated with Saline Topical Anesthetic Applied: Other: lidocaine 4%, Treatment Notes Wound #2 (Left, Plantar Toe Great) 1. Cleansed with: Clean wound with Normal Saline 2. Anesthetic Topical Lidocaine 4% cream to wound bed prior to debridement 4. Dressing Applied: Santyl Ointment 5. Secondary Dressing Applied Dry Gauze Notes coverlet, stretch netting #2 Electronic Signature(s) Signed: 12/10/2017 4:08:09 PM By: Alric Quan Entered By: Alric Quan on 12/10/2017 15:02:36 Clute, [id number] [name]. ([id number]) -------------------------------------------------------------------------------- Wound Assessment Details Patient Name: [name]. Date of Service: 12/10/2017 2:15 PM Medical Record Number: [id number] Patient Account Number: [id number] Date of Birth/Sex: Oct 30, 1926 (84 y.o. Female) Treating RN: Carolyne Fiscal, Debi Primary Care Gennifer Potenza: Lucianne Lei Other Clinician: Referring Bernd Crom: Lucianne Lei Treating  Naylani Bradner/Extender: Melburn Hake, HOYT Weeks in Treatment: 28 Wound Status Wound Number: 3 Primary Skin Tear Etiology: Wound Location: Left Toe Great - Medial Wound Status: Healed - Epithelialized Wounding Event: Gradually Appeared Comorbid Anemia, Hypertension, Type II Diabetes, Date Acquired: 12/02/2017 History: Osteoarthritis Weeks Of Treatment: 1 Clustered Wound: No Photos Photo Uploaded By: Alric Quan on 12/10/2017 17:20:54 Wound Measurements Length: (cm) 0 % Re Width: (cm) 0 % Re Depth: (cm) 0 Epit Area: (cm) 0 Tun Volume: (cm) 0 Und duction in Area: 100% duction in Volume: 100% helialization: Large (67-100%) neling: No ermining: No Wound Description Classification: Partial Thickness Wound Margin: Distinct, outline attached Exudate Amount: Large Exudate Type: Serosanguineous Exudate Color: red, brown Foul Odor After Cleansing: No Slough/Fibrino No Wound Bed Granulation Amount: None Present (0%) Exposed Structure Necrotic Amount: None Present (0%) Fascia Exposed: No Fat Layer (Subcutaneous Tissue) Exposed: No Tendon Exposed: No Muscle Exposed: No Joint Exposed: No Bone Exposed: No Periwound Skin Texture Hemstreet, Carmeline [name]. ([id number]) Texture Color No Abnormalities Noted: No No Abnormalities Noted: No Moisture Temperature / Pain No Abnormalities Noted: No Temperature: No Abnormality Tenderness on Palpation: Yes Wound Preparation Ulcer Cleansing: Rinsed/Irrigated with Saline Topical Anesthetic Applied: None  Electronic Signature(s) Signed: 12/10/2017 4:08:09 PM By: Alric Quan Entered By: Alric Quan on 12/10/2017 15:03:10 Mitten, [name] ([id number]) -------------------------------------------------------------------------------- Vitals Details Patient Name: [name]. Date of Service: 12/10/2017 2:15 PM Medical Record Number: [id number] Patient Account Number: [id number] Date of Birth/Sex: [birth year] (84 y.o.  Female) Treating RN: Carolyne Fiscal, Debi Primary Care Adebayo Ensminger: Lucianne Lei Other Clinician: Referring Red Mandt: Lucianne Lei Treating Kahliya Fraleigh/Extender: Melburn Hake, HOYT Weeks in Treatment: 28 Vital Signs Time Taken: 14:50 Pulse (bpm): 84 Height (in): 65 Respiratory Rate (breaths/min): 16 Weight (lbs): 131 Blood Pressure (mmHg): 138/65 Body Mass Index (BMI): 21.8 Reference Range: 80 - 120 mg / dl Electronic Signature(s) Signed: 12/10/2017 4:08:09 PM By: Alric Quan Entered By: Alric Quan on 12/10/2017 14:52:48

## 2017-12-12 NOTE — Progress Notes (Addendum)
EMMI, WERTHEIM ([id number]) Visit Report for 12/10/2017 Chief Complaint Document Details Patient Name: [name], [name] Date of Service: 12/10/2017 2:15 PM Medical Record Number: [id number] Patient Account Number: [id number] Date of Birth/Sex: 06/07/1926 (84 y.o. Female) Treating RN: Carolyne Fiscal, Debi Primary Care Provider: Lucianne Lei Other Clinician: Referring Provider: Lucianne Lei Treating Provider/Extender: Melburn Hake, HOYT Weeks in Treatment: 28 Information Obtained from: Patient Chief Complaint Patients presents for treatment of an open diabetic ulcer 2 the left lower anterior leg which she sustained an injury by blunt trauma Electronic Signature(s) Signed: 12/12/2017 5:30:41 PM By: Worthy Keeler PA-C Entered By: Worthy Keeler on 12/10/2017 15:48:42 Goodwill, [name] ([id number]) -------------------------------------------------------------------------------- Debridement Details Patient Name: Alexa Lowes P. Date of Service: 12/10/2017 2:15 PM Medical Record Number: [id number] Patient Account Number: [id number] Date of Birth/Sex: 11/03/1926 (84 y.o. Female) Treating RN: Carolyne Fiscal, Debi Primary Care Provider: Lucianne Lei Other Clinician: Referring Provider: Lucianne Lei Treating Provider/Extender: Melburn Hake, HOYT Weeks in Treatment: 28 Debridement Performed for Wound #2 Left,Plantar Toe Great Assessment: Performed By: Physician STONE III, HOYT E., PA-C Debridement: Open Wound/Selective Severity of Tissue Pre Fat layer exposed Debridement: Debridement Description: Selective Pre-procedure Verification/Time Yes - 15:11 Out Taken: Start Time: 15:12 Pain Control: Lidocaine 4% Topical Solution Level: Non-Viable Tissue Total Area Debrided (L x W): 1.1 (cm) x 1.2 (cm) = 1.32 (cm) Tissue and other material Non-Viable, Eschar, Fibrin/Slough debrided: Instrument: Forceps, Scissors Bleeding: Minimum Hemostasis Achieved: Pressure End Time: 15:17 Procedural  Pain: 0 Post Procedural Pain: 0 Response to Treatment: Procedure was tolerated well Post Debridement Measurements of Total Wound Length: (cm) 1.1 Width: (cm) 1.2 Depth: (cm) 0.3 Volume: (cm) 0.311 Character of Wound/Ulcer Post Debridement: Requires Further Debridement Severity of Tissue Post Debridement: Fat layer exposed Post Procedure Diagnosis Same as Pre-procedure Electronic Signature(s) Signed: 12/10/2017 4:08:09 PM By: Alric Quan Signed: 12/12/2017 5:30:41 PM By: Worthy Keeler PA-C Entered By: Alric Quan on 12/10/2017 15:18:41 Digioia, [name] ([id number]) -------------------------------------------------------------------------------- HPI Details Patient Name: Alexa Lowes P. Date of Service: 12/10/2017 2:15 PM Medical Record Number: [id number] Patient Account Number: [id number] Date of Birth/Sex: 10-20-1926 (84 y.o. Female) Treating RN: Carolyne Fiscal, Debi Primary Care Provider: Lucianne Lei Other Clinician: Referring Provider: Lucianne Lei Treating Provider/Extender: Melburn Hake, HOYT Weeks in Treatment: 28 History of Present Illness Location: left anterior shin Quality: Patient reports experiencing a dull pain to affected area(s). Severity: Patient states wound are getting worse. Duration: Patient has had the wound for < 3 weeks prior to presenting for treatment Timing: Pain in wound is Intermittent (comes and goes Context: The wound occurred when the patient blunt trauma against her left shin Modifying Factors: Other treatment(s) tried include:active Bactroban ointment locally Associated Signs and Symptoms: Patient reports having: occasional discharge from the wound HPI Description: 84 year old patient was seen in the ED about 10 days ago for a history of abrasion to the left lower extremity while she was boarding a bus and scraped the anterior part of her left leg. She has been a diabetic for many years and has been taking treatment regularly. Her past  medical history is also significant for anemia arthritis constipation and hypertension and is status post abdominal hysterectomy. She has never been a smoker. after her ER visit she was advised to apply Bactroban ointment to the wound twice a day and continue to monitor her blood glucose levels. her last hemoglobin A1c was done 3 years ago and was 6.6% 06/04/17 patient left anterior shin wound appears to be doing well although it is still  somewhat dry despite the treatment with Medihoney. We have been using Kerlex following the Medihoney application and I believe this is just not retaining that much moisture. Nonetheless there is no evidence of infection. 06/11/17 on evaluation today patient appears to be doing a little bit worse in regard to her wound. The entirety of the wound is dry and unfortunately the Medihoney does not seem to be helping this. That is even with the dressing changes that I made last week to try to retain more moisture. She has not tried [employer] as of yet. She was also noncompressible when testing for ABIs. 07/01/2017 -- she had a arterial studies done and was seen by Dr. Lucky Cowboy. her noninvasive studies showed noncompressible vessels on the right but brisk waveforms and digital pressures of 71 on the right consistent with only mild arterial insufficienc. On the left her ABI was 0.47 and this may be falsely elevated due to calcification. No digital pressures were obtained on the left and this is consistent with severe arterial insufficiency. this critical limb threatening situation on the left, would make wound healing difficult and it represents a serious situation and he has recommended an angiography with possible revascularization. The patient desired to defer the decision to she discuss with the family members. 07/09/17 on evaluation today patient appears to be doing fairly well in regard to her left anterior lower extremity wound. She has been tolerating the dressing changes  without complication we have been utilizing Santyl at this time. She tells me that she is having no significant pain compared to what she has had in the past. She has decided after discussing with family that she is going to go forward with the surgery with Dr. dew. This is to restore better blood flow to left lower extremity. With that it does appear that her lower extremity wound is making some progress albeit slow. No fevers, chills, nausea, or vomiting noted at this time. 07/16/17 on evaluation today patient's wound appears to be doing roughly about the same although some of the slough is clearing off. [name] she has her appointment with vascular next week on Thursday the 23rd. With that being said her wound does not appear to be hurting as badly as it has in the past which is good news. No fevers, chills, nausea, or vomiting noted at this time. 07/30/2017 -- she had surgery on 07/22/2017 --indications being nonhealing ulcer on the left leg with a noninvasive study showing marked reduction in ABI of less than 0.5 and no digital pressure on her left leg. she had a percutaneous transluminal angioplasty of the left common and external iliac arteries. She then had a stent placed to the left external iliac arteries and to the left common iliac artery. the left lower extremity showed occlusion of the common femoral artery and the origins of the Tonche, [name] ([id number]) SFA and the profunda femoris artery. The flow distally was poor and there were multiple areas of high-grade stenosis or occlusion of the distal SFA and the popliteal artery with only a diseased peroneal artery as runoff distally. She has a postop appointment on September 23 08/20/2017 -- she went back to the vascular office for an appointment on 08/12/2017 and bilateral ABIs were done which were notable for moderate right lower extremity arterial disease and unable to obtain left ankle brachial indices due to absent Doppler  velocities in the posterior tibial artery and the anterior tibial artery. Monophasic waveforms to the left femoral and popliteal artery. this was  worrisome for a failure of stents placed and lower extremity angiogram was recommended. as was done on 08/16/2017 and the stents placed in the left eye Lex system had some mild narrowing at the iliac bifurcation and the leading edge of the stent proximally was poor wall apposition with stenosis in the 50-60% range in the right common iliac artery. There was also occlusion of the common femoral artery and the origin of the SFA and profunda femoris artery. Flow distally was poor but they appeared to be multiple areas of high-grade stenosis or occlusion in the distal SFA and popliteal artery with only a diseased peroneal artery. in another stent was placed and deployed. The femoral occlusion would need to be treated surgically. 08/27/17 on evaluation today patient appears to be doing about the same in regard to her left for extremity wound. Unfortunately secondary to her vascular status this is not a very well healing wound at this time. She has been tolerating the dressing changes she has some discomfort but mainly with cleansing of the wound not at other times. No fevers, chills, nausea, or vomiting noted at this time. 09/17/2017 -- the patient was recently discharged from the hospital where she was admitted between 09/08/2017 and 09/10/2017, when she had a procedure on her left femoral artery with the endarterectomy and a patch. she will be going back for review this coming week. 09/24/2017 -- was seen on 09/18/2017 in the ER, for bleeding from her wound which was debrided earlier during the day. At the time she was seen there was no active bleeding. A thrombin pad was placed over the area and she was asked to change the outside dressing but come back to the wound center as planned. she was also seen at the [facility] office for a  review after her surgery and bilateral lower extremity ABI when compared to the previous ones showed minimal improvement of arterial blood flow. She was set up for a CT angiogram of the abdomen and pelvis and lower extremities to see the degree of peripheral arterial disease 10/28/2017 -- had a x-ray of the left tibia and fibula done on 10/07/2017 -- showed no bony acute abnormality she was reviewed by Dr. Corene Cornea dew on 10/26/2017, and he reviewed her CT angiogram and this demonstrated a widely patent left femoral endarterectomy including the origins of the femoral artery. She also had moderate distal disease in the popliteal artery, distal SFA and tibial disease. Her peroneal artery is a dominant runoff distally.he felt she had adequate perfusion for healing the mid calf level and he is putting off all surgical intervention for now and is going to see her back in 2-3 months for noninvasive studies. She also has a new wound on the plantar aspect of the left big toe where she pulled a piece of skin and has caused a superficial ulceration 11/19/17 on evaluation today patient appears to be doing decently well at this point in time. It does appear that her Wound VAC has been approved as ordered by Dr. Con Memos however she has not received that as of yet. Nonetheless the wound bed in general appears to have minimal slough noted at this time in a fairly good amount of granulation although there is to be a noted in the center of the wound. No fevers, chills, nausea, or vomiting noted at this time. 12/02/17 on evaluation today patient appears to be doing about the same in regard to her left anterior shin wound. Unfortunately her left  toe wound appears to still be doing about the same as far as the eschar covering. This has softened up a bit to the point that it is now loose and could have selective debridement which would allow for the Santyl to work better. Fortunately she is having no pain. 12/10/17 on  evaluation today patient appears to be doing fairly well in regard to her left anterior shin wound and that it at least appears to be stable. With that being said she does have really no significant healing at this point this wound seems to be mainly just maintaining. Subsequently patient also has the left toe wound which does not seem to be progressing. There is still nonviable tissue overlying the surface of the wound and this doesn't even seem to be loosening up very well at all. And patient has been using the Santyl. I do think this may need selective debridement today. Fortunately there does not appear to be any evidence of infection although I am concerned about the possibility that patient's blood flow is not likely sufficient to be able to tolerate appropriate wound healing. This was discussed with patient today as well although obviously I would leave the [facility], [name] ([id number]) determination up to vascular surgery. She will be seeing Dr. dew on February 5 for repeat vascular studies and then in office evaluation. Electronic Signature(s) Signed: 12/12/2017 5:30:41 PM By: Worthy Keeler PA-C Entered By: Worthy Keeler on 12/10/2017 15:49:07 Galster, [name] ([id number]) -------------------------------------------------------------------------------- Physical Exam Details Patient Name: Alexa Lowes P. Date of Service: 12/10/2017 2:15 PM Medical Record Number: [id number] Patient Account Number: [id number] Date of Birth/Sex: 29-Feb-1926 (84 y.o. Female) Treating RN: Carolyne Fiscal, Debi Primary Care Provider: Lucianne Lei Other Clinician: Referring Provider: Lucianne Lei Treating Provider/Extender: STONE III, HOYT Weeks in Treatment: 90 Constitutional Well-nourished and well-hydrated in no acute distress. Respiratory normal breathing without difficulty. clear to auscultation bilaterally. Cardiovascular regular rate and rhythm with normal S1, S2. Psychiatric this patient  is able to make decisions and demonstrates good insight into disease process. Alert and Oriented x 3. pleasant and cooperative. Notes Patient's wound bed appears to be covered with nonviable tissue in regard to the left first toe. Her left anterior shin shows bone exposed centrally and does not seem to be making progress as I would've hoped. We have still been waiting on the Wound VAC. Electronic Signature(s) Signed: 12/12/2017 5:30:41 PM By: Worthy Keeler PA-C Entered By: Worthy Keeler on 12/10/2017 15:50:15 Ney, [name] ([id number]) -------------------------------------------------------------------------------- Physician Orders Details Patient Name: Alexa Lowes P. Date of Service: 12/10/2017 2:15 PM Medical Record Number: [id number] Patient Account Number: [id number] Date of Birth/Sex: [birth year] (84 y.o. Female) Treating RN: Carolyne Fiscal, Debi Primary Care Provider: Lucianne Lei Other Clinician: Referring Provider: Lucianne Lei Treating Provider/Extender: Melburn Hake, HOYT Weeks in Treatment: 28 Verbal / Phone Orders: Yes Clinician: Pinkerton, Debi Read Back and Verified: Yes Diagnosis Coding Wound Cleansing Wound #1 Left,Anterior Lower Leg o Clean wound with Normal Saline. Wound #2 Left,Plantar Toe Great o Clean wound with Normal Saline. Anesthetic (add to Medication List) Wound #1 Left,Anterior Lower Leg o Topical Lidocaine 4% cream applied to wound bed prior to debridement (In Clinic Only). Wound #2 Left,Plantar Toe Great o Topical Lidocaine 4% cream applied to wound bed prior to debridement (In Clinic Only). Primary Wound Dressing Wound #1 Left,Anterior Lower Leg o [place] Non-Adherent Wound #2 Left,Plantar Toe Great o Santyl Ointment Secondary Dressing Wound #1 Left,Anterior Lower Leg o Dry Gauze o Boardered Foam Dressing o  Other - stretch netting #4 Wound #2 Left,Plantar Toe Great o Dry Gauze o Other - coverlet  (band-aide) stretch netting #2 Dressing Change Frequency Wound #1 Left,Anterior Lower Leg o Change dressing every other day. Wound #2 Left,Plantar Toe Great o Change dressing every day. Follow-up Appointments o Return Appointment in 1 week. [place], [name] ([id number]) Wound #2 Left,Plantar Toe Great o Open toe surgical shoe with peg assist. Additional Orders / Instructions Wound #1 Left,Anterior Lower Leg o Increase protein intake. Wound #2 Left,Plantar Toe Great o Increase protein intake. Home Health Wound #1 [place] for [place] Nurse may visit PRN to address patientos wound care needs. o FACE TO FACE ENCOUNTER: MEDICARE and MEDICAID PATIENTS: I certify that this patient is under my care and that I had a face-to-face encounter that meets the physician face-to-face encounter requirements with this patient on this date. The encounter with the patient was in whole or in part for the following MEDICAL CONDITION: (primary reason for [place]) MEDICAL NECESSITY: I certify, that based on my findings, NURSING services are a medically necessary home health service. HOME BOUND STATUS: I certify that my clinical findings support that this patient is homebound (i.e., Due to illness or injury, pt requires aid of supportive devices such as crutches, cane, wheelchairs, walkers, the use of special transportation or the assistance of another person to leave their place of residence. There is a normal inability to leave the home and doing so requires considerable and taxing effort. Other absences are for medical reasons / religious services and are infrequent or of short duration when for other reasons). o If current dressing causes regression in wound condition, may D/C ordered dressing product/s and apply Normal Saline Moist Dressing daily until next [place] / Other MD appointment.  [place] of regression in wound condition at [phone]. o Please direct any NON-WOUND related issues/requests for orders to patient's Primary Care Physician Wound #2 [place] for [place] Nurse may visit PRN to address patientos wound care needs. o FACE TO FACE ENCOUNTER: MEDICARE and MEDICAID PATIENTS: I certify that this patient is under my care and that I had a face-to-face encounter that meets the physician face-to-face encounter requirements with this patient on this date. The encounter with the patient was in whole or in part for the following MEDICAL CONDITION: (primary reason for [place]) MEDICAL NECESSITY: I certify, that based on my findings, NURSING services are a medically necessary home health service. HOME BOUND STATUS: I certify that my clinical findings support that this patient is homebound (i.e., Due to illness or injury, pt requires aid of supportive devices such as crutches, cane, wheelchairs, walkers, the use of special transportation or the assistance of another person to leave their place of residence. There is a normal inability to leave the home and doing so requires considerable and taxing effort. Other absences are for medical reasons / religious services and are infrequent or of short duration when for other reasons). o If current dressing causes regression in wound condition, may D/C ordered dressing product/s and apply Normal Saline Moist Dressing daily until next [place] / Other MD appointment. [place] of regression in wound condition at [phone]. o Please direct any NON-WOUND related issues/requests for orders to patient's Primary Care Physician Negative Pressure Wound Therapy Wound #1 Left,Anterior Lower Leg o Wound VAC settings at 125/130  mmHg continuous pressure. Use BLACK/GREEN foam to wound cavity. Use WHITE foam to fill  any tunnel/s and/or undermining. Change VAC dressing 3 X WEEK. Change canister as indicated when full. Nurse may titrate settings and frequency of dressing changes as clinically indicated. - order wound vac black foam o Apply contact layer over base of wound. - mepitel one o Number of foam/gauze pieces used in the dressing = Schonberger, [name] ([id number]) Patient Medications Allergies: No Known Drug Allergies Notifications Medication Indication Start End lidocaine DOSE 1 - topical 4 % cream - 1 cream topical Electronic Signature(s) Signed: 12/10/2017 4:08:09 PM By: Alric Quan Signed: 12/12/2017 5:30:41 PM By: Worthy Keeler PA-C Entered By: Alric Quan on 12/10/2017 15:51:45 Delaine, [name] ([id number]) -------------------------------------------------------------------------------- Prescription 12/10/2017 Patient Name: Alexa Lowes P. Provider: Worthy Keeler PA-C Date of Birth: 06-Feb-1926 NPI#: [id number] Sex: F DEA#: [id number] Phone #: [phone] License #: Patient Address: [facility] [place], [place] [address] [address], [place], [place] [address] [phone] Allergies No Known Drug Allergies Medication Medication: Route: Strength: Form: lidocaine 4 % topical cream topical 4% cream Class: TOPICAL LOCAL ANESTHETICS Dose: Frequency / Time: Indication: 1 1 cream topical Number of Refills: Number of Units: 0 Generic Substitution: Start Date: End Date: One Time Use: Substitution Permitted No Note to Pharmacy: Signature(s): Date(s): Electronic Signature(s) Signed: 12/10/2017 4:08:09 PM By: Alric Quan Signed: 12/12/2017 5:30:41 PM By: Worthy Keeler PA-C Entered By: Alric Quan on 12/10/2017 15:51:46 [place], [name] ([id number]) --------------------------------------------------------------------------------  Problem List  Details Patient Name: Alexa Lowes P. Date of Service: 12/10/2017 2:15 PM Medical Record Number: [id number] Patient Account Number: [id number] Date of Birth/Sex: 1926-09-29 (84 y.o. 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 ([id number]) -------------------------------------------------------------------------------- Progress Note Details Patient Name: Alexa Lowes P. Date of Service: 12/10/2017 2:15 PM Medical Record Number: [id number] Patient Account Number: [id number] Date of Birth/Sex: 1926-07-10 (84 y.o. Female) Treating RN: Carolyne Fiscal, Debi Primary Care Provider: Lucianne Lei Other Clinician: Referring Provider: Lucianne Lei Treating Provider/Extender: Melburn Hake, HOYT Weeks in Treatment: 28 Subjective Chief Complaint Information obtained from Patient Patients presents for treatment of an open diabetic ulcer 2 the left lower anterior leg which she sustained an injury by blunt trauma History of Present Illness (HPI) The following HPI elements were documented for the patient's wound: Location: left anterior shin Quality: Patient reports experiencing a dull pain to affected area(s). Severity: Patient states wound are getting  worse. Duration: Patient has had the wound for < 3 weeks prior to presenting for treatment Timing: Pain in wound is Intermittent (comes and goes Context: The wound occurred when the patient blunt trauma against her left shin Modifying Factors: Other treatment(s) tried include:active Bactroban ointment locally Associated Signs and Symptoms: Patient reports having: occasional discharge from the wound 84 year old patient was seen in the ED about  10 days ago for a history of abrasion to the left lower extremity while she was boarding a bus and scraped the anterior part of her left leg. She has been a diabetic for many years and has been taking treatment regularly. Her past medical history is also significant for anemia arthritis constipation and hypertension and is status post abdominal hysterectomy. She has never been a smoker. after her ER visit she was advised to apply Bactroban ointment to the wound twice a day and continue to monitor her blood glucose levels. her last hemoglobin A1c was done 3 years ago and was 6.6% 06/04/17 patient left anterior shin wound appears to be doing well although it is still somewhat dry despite the treatment with Medihoney. We have been using Kerlex following the Medihoney application and I believe this is just not retaining that much moisture. Nonetheless there is no evidence of infection. 06/11/17 on evaluation today patient appears to be doing a little bit worse in regard to her wound. The entirety of the wound is dry and unfortunately the Medihoney does not seem to be helping this. That is even with the dressing changes that I made last week to try to retain more moisture. She has not tried [employer] as of yet. She was also noncompressible when testing for ABIs. 07/01/2017 -- she had a arterial studies done and was seen by Dr. Lucky Cowboy. her noninvasive studies showed noncompressible vessels on the right but brisk waveforms and digital pressures of 71 on the right  consistent with only mild arterial insufficienc. On the left her ABI was 0.47 and this may be falsely elevated due to calcification. No digital pressures were obtained on the left and this is consistent with severe arterial insufficiency. this critical limb threatening situation on the left, would make wound healing difficult and it represents a serious situation and he has recommended an angiography with possible revascularization. The patient desired to defer the decision to she discuss with the family members. 07/09/17 on evaluation today patient appears to be doing fairly well in regard to her left anterior lower extremity wound. She has been tolerating the dressing changes without complication we have been utilizing Santyl at this time. She tells me that she is having no significant pain compared to what she has had in the past. She has decided after discussing with family that she is going to go forward with the surgery with Dr. dew. This is to restore better blood flow to left lower extremity. With that it does appear that her lower extremity wound is making some progress albeit slow. No fevers, chills, nausea, or vomiting noted at this time. LAMIJA, BESSE ([id number]) 07/16/17 on evaluation today patient's wound appears to be doing roughly about the same although some of the slough is clearing off. [name] she has her appointment with vascular next week on Thursday the 23rd. With that being said her wound does not appear to be hurting as badly as it has in the past which is good news. No fevers, chills, nausea, or vomiting noted at this time. 07/30/2017 -- she had surgery on 07/22/2017 --indications being nonhealing ulcer on the left leg with a noninvasive study showing marked reduction in ABI of less than 0.5 and no digital pressure on her left leg. she had a percutaneous transluminal angioplasty of the left common and external iliac arteries. She then had a stent placed to the left  external iliac arteries and to the left common iliac artery. the left lower extremity showed occlusion  of the common femoral artery and the origins of the SFA and the profunda femoris artery. The flow distally was poor and there were multiple areas of high-grade stenosis or occlusion of the distal SFA and the popliteal artery with only a diseased peroneal artery as runoff distally. She has a postop appointment on September 23 08/20/2017 -- she went back to the vascular office for an appointment on 08/12/2017 and bilateral ABIs were done which were notable for moderate right lower extremity arterial disease and unable to obtain left ankle brachial indices due to absent Doppler velocities in the posterior tibial artery and the anterior tibial artery. Monophasic waveforms to the left femoral and popliteal artery. this was worrisome for a failure of stents placed and lower extremity angiogram was recommended. as was done on 08/16/2017 and the stents placed in the left eye Lex system had some mild narrowing at the iliac bifurcation and the leading edge of the stent proximally was poor wall apposition with stenosis in the 50-60% range in the right common iliac artery. There was also occlusion of the common femoral artery and the origin of the SFA and profunda femoris artery. Flow distally was poor but they appeared to be multiple areas of high-grade stenosis or occlusion in the distal SFA and popliteal artery with only a diseased peroneal artery. in another stent was placed and deployed. The femoral occlusion would need to be treated surgically. 08/27/17 on evaluation today patient appears to be doing about the same in regard to her left for extremity wound. Unfortunately secondary to her vascular status this is not a very well healing wound at this time. She has been tolerating the dressing changes she has some discomfort but mainly with cleansing of the wound not at other times. No fevers,  chills, nausea, or vomiting noted at this time. 09/17/2017 -- the patient was recently discharged from the hospital where she was admitted between 09/08/2017 and 09/10/2017, when she had a procedure on her left femoral artery with the endarterectomy and a patch. she will be going back for review this coming week. 09/24/2017 -- was seen on 09/18/2017 in the ER, for bleeding from her wound which was debrided earlier during the day. At the time she was seen there was no active bleeding. A thrombin pad was placed over the area and she was asked to change the outside dressing but come back to the wound center as planned. she was also seen at the [facility] office for a review after her surgery and bilateral lower extremity ABI when compared to the previous ones showed minimal improvement of arterial blood flow. She was set up for a CT angiogram of the abdomen and pelvis and lower extremities to see the degree of peripheral arterial disease 10/28/2017 -- had a x-ray of the left tibia and fibula done on 10/07/2017 -- showed no bony acute abnormality she was reviewed by Dr. Corene Cornea dew on 10/26/2017, and he reviewed her CT angiogram and this demonstrated a widely patent left femoral endarterectomy including the origins of the femoral artery. She also had moderate distal disease in the popliteal artery, distal SFA and tibial disease. Her peroneal artery is a dominant runoff distally.he felt she had adequate perfusion for healing the mid calf level and he is putting off all surgical intervention for now and is going to see her back in 2-3 months for noninvasive studies. She also has a new wound on the plantar aspect of the left big toe where she  pulled a piece of skin and has caused a superficial ulceration 11/19/17 on evaluation today patient appears to be doing decently well at this point in time. It does appear that her Wound VAC has been approved as ordered by Dr. Con Memos however  she has not received that as of yet. Nonetheless the wound bed in general appears to have minimal slough noted at this time in a fairly good amount of granulation although there is to be a noted in the center of the wound. No fevers, chills, nausea, or vomiting noted at this time. 12/02/17 on evaluation today patient appears to be doing about the same in regard to her left anterior shin wound. Unfortunately her left toe wound appears to still be doing about the same as far as the eschar covering. This has softened up a bit to the point that it is now loose and could have selective debridement which would allow for the Santyl to work better. Fortunately she is having no pain. [name], [name] ([id number]) 12/10/17 on evaluation today patient appears to be doing fairly well in regard to her left anterior shin wound and that it at least appears to be stable. With that being said she does have really no significant healing at this point this wound seems to be mainly just maintaining. Subsequently patient also has the left toe wound which does not seem to be progressing. There is still nonviable tissue overlying the surface of the wound and this doesn't even seem to be loosening up very well at all. And patient has been using the Santyl. I do think this may need selective debridement today. Fortunately there does not appear to be any evidence of infection although I am concerned about the possibility that patient's blood flow is not likely sufficient to be able to tolerate appropriate wound healing. This was discussed with patient today as well although obviously I would leave the determination up to vascular surgery. She will be seeing Dr. dew on February 5 for repeat vascular studies and then in office evaluation. Patient History Information obtained from Patient. Social History Never smoker, Marital Status - Separated, Alcohol Use - Never, Drug Use - No History, Caffeine Use - Rarely. Review of  Systems (ROS) Constitutional Symptoms (General Health) Denies complaints or symptoms of Fever, Chills. Respiratory The patient has no complaints or symptoms. Cardiovascular The patient has no complaints or symptoms. Psychiatric The patient has no complaints or symptoms. Objective Constitutional Well-nourished and well-hydrated in no acute distress. Vitals Time Taken: 2:50 PM, Height: 65 in, Weight: 131 lbs, BMI: 21.8, Pulse: 84 bpm, Respiratory Rate: 16 breaths/min, Blood Pressure: 138/65 mmHg. Respiratory normal breathing without difficulty. clear to auscultation bilaterally. Cardiovascular regular rate and rhythm with normal S1, S2. Psychiatric this patient is able to make decisions and demonstrates good insight into disease process. Alert and Oriented x 3. pleasant and cooperative. General Notes: Patient's wound bed appears to be covered with nonviable tissue in regard to the left first toe. Her left anterior shin shows bone exposed centrally and does not seem to be making progress as I would've hoped. We have still been waiting on the Wound VAC. [name], Gelena P. ([id number]) Integumentary (Hair, Skin) Wound #1 status is Open. Original cause of wound was Trauma. The wound is located on the Left,Anterior Lower Leg. The wound measures 7.6cm length x 3.2cm width x 0.2cm depth; 19.101cm^2 area and 3.82cm^3 volume. There is bone, Fat Layer (Subcutaneous Tissue) Exposed, and fascia exposed. There is no tunneling or undermining noted.  There is a large amount of serous drainage noted. The wound margin is flat and intact. There is medium (34-66%) red granulation within the wound bed. There is a medium (34-66%) amount of necrotic tissue within the wound bed including Adherent Slough. The periwound skin appearance exhibited: Scarring, Maceration, Hemosiderin Staining, Erythema. The periwound skin appearance did not exhibit: Callus, Crepitus, Excoriation, Induration, Rash, Dry/Scaly,  Atrophie Blanche, Cyanosis, Ecchymosis, Mottled, Pallor, Rubor. The surrounding wound skin color is noted with erythema which is circumferential. Periwound temperature was noted as No Abnormality. The periwound has tenderness on palpation. Wound #2 status is Open. Original cause of wound was Trauma. The wound is located on the [employer]. The wound measures 1.1cm length x 1.2cm width x 0.2cm depth; 1.037cm^2 area and 0.207cm^3 volume. There is no tunneling noted, however, there is undermining starting at 12:00 and ending at 12:00 with a maximum distance of 0.3cm. There is a large amount of serous drainage noted. The wound margin is flat and intact. There is no granulation within the wound bed. There is a large (67-100%) amount of necrotic tissue within the wound bed including Eschar and Adherent Slough. The periwound skin appearance did not exhibit: Callus, Crepitus, Excoriation, Induration, Rash, Scarring, Dry/Scaly, Maceration, Atrophie Blanche, Cyanosis, Ecchymosis, Hemosiderin Staining, Mottled, Pallor, Rubor, Erythema. Periwound temperature was noted as No Abnormality. Wound #3 status is Healed - Epithelialized. Original cause of wound was Gradually Appeared. The wound is located on the [employer]. The wound measures 0cm length x 0cm width x 0cm depth; 0cm^2 area and 0cm^3 volume. There is no tunneling or undermining noted. There is a large amount of serosanguineous drainage noted. The wound margin is distinct with the outline attached to the wound base. There is no granulation within the wound bed. There is no necrotic tissue within the wound bed. Periwound temperature was noted as No Abnormality. The periwound has tenderness on palpation. 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 There was a Non-Viable Tissue Open Wound/Selective [phone]) debridement with total area of 1.32 sq cm performed by STONE III, HOYT E., PA-C. with the following instrument(s): Forceps and Scissors to remove Non-Viable tissue/material including Fibrin/Slough and Eschar after achieving pain control using Lidocaine 4% Topical Solution. A time out was conducted at 15:11, prior to the start of the procedure. A Minimum amount of bleeding was controlled with Pressure. The procedure was tolerated well with a pain level of 0 throughout and a pain level of 0 following the procedure. Post Debridement Measurements: 1.1cm length x 1.2cm width x 0.3cm depth; 0.311cm^3 volume. Character of Wound/Ulcer Post Debridement requires further debridement. Severity of Tissue Post Debridement is: Fat layer exposed. Post procedure Diagnosis Wound #2: Same as Pre-Procedure Dado, [name] ([id number]) Plan Wound Cleansing: Wound #1 Left,Anterior Lower Leg: Clean wound with Normal Saline. Wound #2 Left,Plantar Toe Great: Clean wound with Normal Saline. Anesthetic (add to Medication List): Wound #1 Left,Anterior Lower Leg: Topical Lidocaine 4% cream applied to wound bed prior to debridement (In Clinic Only). Wound #2 Left,Plantar Toe Great: Topical Lidocaine 4% cream  applied to wound bed prior to debridement (In Clinic Only). Primary Wound Dressing: Wound #1 Left,Anterior Lower Leg: [place] Non-Adherent Wound #2 Left,Plantar Toe Great: Santyl Ointment Secondary Dressing: Wound #1 Left,Anterior Lower Leg: Dry Gauze Boardered Foam Dressing Other - stretch netting #4 Wound #2 Left,Plantar Toe Great: Dry  Gauze Other - coverlet (band-aide) stretch netting #2 Dressing Change Frequency: Wound #1 Left,Anterior Lower Leg: Change dressing every other day. Wound #2 Left,Plantar Toe Great: Change dressing every day. Follow-up Appointments: Return Appointment in 1 week. Off-Loading: Wound #2 Left,Plantar Toe Great: Open toe surgical shoe with peg assist. Additional Orders / Instructions: Wound #1 Left,Anterior Lower Leg: Increase protein intake. Wound #2 Left,Plantar Toe Great: Increase protein intake. Home Health: Wound #1 Left,Anterior Lower Leg: [place] for [place] Nurse may visit PRN to address patient s wound care needs. FACE TO FACE ENCOUNTER: MEDICARE and MEDICAID PATIENTS: I certify that this patient is under my care and that I had a face-to-face encounter that meets the physician face-to-face encounter requirements with this patient on this date. The encounter with the patient was in whole or in part for the following MEDICAL CONDITION: (primary reason for [place]) MEDICAL NECESSITY: I certify, that based on my findings, NURSING services are a medically necessary home health service. HOME BOUND STATUS: I certify that my clinical findings support that this patient is homebound (i.e., Due to illness or injury, pt requires aid of supportive devices such as crutches, cane, wheelchairs, walkers, the use of special transportation or the assistance of another person to leave their place of residence. There is a normal inability to leave the home and doing so requires considerable and taxing effort. Other absences are for medical reasons / religious services and are infrequent or of short duration when for other reasons). Florido, Ellasyn P. ([id number]) If current dressing causes regression in wound condition, may D/C ordered dressing product/s and apply Normal Saline Moist Dressing daily until next [place] / Other MD appointment. [place] of regression in wound condition at [phone]. Please direct any NON-WOUND related issues/requests for orders to patient's Primary Care Physician Wound #2 Left,Plantar Toe Great: [place] for [place] Nurse may visit PRN to address patient s wound care needs. FACE TO FACE ENCOUNTER: MEDICARE and MEDICAID PATIENTS: I certify that this patient is under my care and that I had a face-to-face encounter that meets the physician face-to-face encounter requirements with this patient on this date. The encounter with the patient was in whole or in part for the following MEDICAL CONDITION: (primary reason for [place]) MEDICAL NECESSITY: I certify, that based on my findings, NURSING services are a medically necessary home health service. HOME BOUND STATUS: I certify that my clinical findings support that this patient is homebound (i.e., Due to illness or injury, pt requires aid of supportive devices such as crutches, cane, wheelchairs, walkers, the use of special transportation or the assistance of another person to leave their place of residence. There is a normal inability to leave the home and doing so requires considerable and taxing effort. Other absences are for medical reasons / religious services and are infrequent or of short duration when for other reasons). If current dressing causes regression in wound condition, may D/C ordered dressing product/s and apply Normal Saline Moist Dressing daily until next [place] / Other MD appointment. [place] of regression in wound condition at [phone]. Please direct any NON-WOUND related  issues/requests for orders to patient's Primary Care Physician Negative Pressure Wound Therapy: Wound #1 Left,Anterior Lower Leg: Wound VAC settings at 125/130 mmHg continuous pressure. Use BLACK/GREEN foam to wound cavity. Use WHITE foam to fill any tunnel/s and/or undermining.  Change VAC dressing 3 X WEEK. Change canister as indicated when full. Nurse may titrate settings and frequency of dressing changes as clinically indicated. - order wound vac black foam Apply contact layer over base of wound. - mepitel one Number of foam/gauze pieces used in the dressing = The following medication(s) was prescribed: lidocaine topical 4 % cream 1 1 cream topical was prescribed at facility On evaluation today I recommended that following this like to debridement but no sharp debridement be performed as I am concerned about her blood flow do not want to make anything worse. We are still working on getting approval for the Wound VAC for the left anterior shin. We will see patient for reevaluation in one week to see were things stand and where we are at that point. Please see above for specific wound care orders. We will see patient for re-evaluation in 1 week(s) here in the clinic. If anything worsens or changes patient will contact our office for additional recommendations. Electronic Signature(s) Signed: 01/03/2018 4:50:01 PM By: Worthy Keeler PA-C Previous Signature: 12/12/2017 5:30:41 PM Version By: Worthy Keeler PA-C Entered By: Worthy Keeler on 01/03/2018 09:02:22 [name] ([id number]) -------------------------------------------------------------------------------- ROS/PFSH Details Patient Name: Alexa Lowes P. Date of Service: 12/10/2017 2:15 PM Medical Record Number: [id number] Patient Account Number: [id number] Date of Birth/Sex: 1925-12-02 (84 y.o. Female) Treating RN: Carolyne Fiscal, Debi Primary Care Provider: Lucianne Lei Other Clinician: Referring Provider: Lucianne Lei Treating Provider/Extender: Melburn Hake, HOYT Weeks in Treatment: 28 Information Obtained From Patient Wound History Do you currently have one or more open woundso Yes How many open wounds do you currently haveo 1 Approximately how long have you had your woundso 3 weeks How have you  been treating your wound(s) until nowo open to air Has your wound(s) ever healed and then re-openedo No Have you had any lab work done in the past montho No Have you tested positive for an antibiotic resistant organism (MRSA, VRE)o No Have you tested positive for osteomyelitis (bone infection)o No Have you had any tests for circulation on your legso No Constitutional Symptoms (General Health) Complaints and Symptoms: Negative for: Fever; Chills Hematologic/Lymphatic Medical History: Positive for: Anemia Respiratory Complaints and Symptoms: No Complaints or Symptoms Cardiovascular Complaints and Symptoms: No Complaints or Symptoms Medical History: Positive for: Hypertension Endocrine Medical History: Positive for: Type II Diabetes Treated with: Oral agents Musculoskeletal Medical History: Positive for: Osteoarthritis Oncologic [name], [name] ([id number]) Medical History: Negative for: Received Chemotherapy; Received Radiation Psychiatric Complaints and Symptoms: No Complaints or Symptoms Immunizations Pneumococcal Vaccine: Received Pneumococcal Vaccination: Yes Immunization Notes: up to date Implantable Devices Family and Social History Never smoker; Marital Status - Separated; Alcohol Use: Never; Drug Use: No History; Caffeine Use: Rarely; Financial Concerns: No; Food, Clothing or Shelter Needs: No; Support System Lacking: No; Transportation Concerns: No; Advanced Directives: No; Patient does not want information on Advanced Directives Physician Affirmation I have reviewed and agree with the above information. Electronic Signature(s) Signed: 12/10/2017 4:08:09 PM By: Alric Quan Signed: 12/12/2017 5:30:41 PM By: Worthy Keeler PA-C Entered By: Worthy Keeler on 12/10/2017 15:49:43 Armstrong, [name] ([id number]) -------------------------------------------------------------------------------- SuperBill Details Patient Name: Alexa Lowes P. Date of  Service: 12/10/2017 Medical Record Number: [id number] Patient Account Number: [id number] Date  of Birth/Sex: [birth year] (84 y.o. 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 of left leg with ulceration of calf Facility Procedures CPT4 Code: [id number] Description: 57903 - DEBRIDE WOUND 1ST 20 SQ CM OR < ICD-10 Diagnosis Description L97.522 Non-pressure chronic ulcer of other part of left foot with fat Modifier: layer exposed Quantity: 1 Physician Procedures CPT4 Code: [id number] Description: 97597 - WC PHYS DEBR WO ANESTH 20 SQ CM ICD-10 Diagnosis Description L97.522 Non-pressure chronic ulcer of other part of left foot with fat Modifier: layer exposed Quantity: 1 Electronic Signature(s) Signed: 12/12/2017 5:30:41 PM By: Worthy Keeler PA-C Entered By: Worthy Keeler on 12/10/2017 15:53:02

## 2017-12-17 ENCOUNTER — Encounter: Payer: Medicare Other | Admitting: Physician Assistant

## 2017-12-17 DIAGNOSIS — E11622 Type 2 diabetes mellitus with other skin ulcer: Secondary | ICD-10-CM | POA: Diagnosis not present

## 2017-12-20 NOTE — Progress Notes (Signed)
[name], [name] ([id number]) Visit Report for 12/17/2017 Chief Complaint Document Details Patient Name: Alexa, [name] Date of Service: 12/17/2017 3:15 PM Medical Record Number: [id number] Patient Account Number: [id number] Date of Birth/Sex: [birth year] (84 y.o. Female) Treating RN: Cornell Barman Primary Care Provider: Lucianne Lei Other Clinician: Referring Provider: Lucianne Lei Treating Provider/Extender: Melburn Hake, Moishy Laday Weeks in Treatment: 29 Information Obtained from: Patient Chief Complaint Patients presents for treatment of an open diabetic ulcer 2 the left lower anterior leg which she sustained an injury by blunt trauma Electronic Signature(s) Signed: 12/20/2017 11:19:49 AM By: Worthy Keeler PA-C Entered By: Worthy Keeler on 12/17/2017 16:09:51 [name], [name] ([id number]) -------------------------------------------------------------------------------- Debridement Details Patient Name: [name] P. Date of Service: 12/17/2017 3:15 PM Medical Record Number: [id number] Patient Account Number: [id number] Date of Birth/Sex: 07/28/1926 (84 y.o. Female) Treating RN: Cornell Barman Primary Care Provider: Lucianne Lei Other Clinician: Referring Provider: Lucianne Lei Treating Provider/Extender: Melburn Hake, Yonna Alwin Weeks in Treatment: 29 Debridement Performed for Wound #2 Left,Plantar Toe Great Assessment: Performed By: Physician STONE III, Garnetta Fedrick E., PA-C Debridement: Chemical/enzymatic Severity of Tissue Pre Fat layer exposed Debridement: Debridement Description: Non-Selective Pre-procedure Verification/Time Yes - 16:30 Out Taken: Start Time: 16:30 End Time: 16:31 Procedural Pain: 0 Post Procedural Pain: 0 Response to Treatment: Procedure was tolerated well Post Debridement Measurements of Total Wound Length: (cm) 1 Width: (cm) 1 Depth: (cm) 0.2 Volume: (cm) 0.157 Character of Wound/Ulcer Post Debridement: Stable Severity of Tissue Post Debridement: Fat  layer exposed Post Procedure Diagnosis Same as Pre-procedure Electronic Signature(s) Signed: 12/17/2017 5:36:23 PM By: Gretta Cool, BSN, RN, CWS, Kim RN, BSN Signed: 12/20/2017 11:19:49 AM By: Worthy Keeler PA-C Entered By: Gretta Cool, BSN, RN, CWS, Kim on 12/17/2017 16:55:22 Banet, [name]. ([id number]) -------------------------------------------------------------------------------- HPI Details Patient Name: [name] P. Date of Service: 12/17/2017 3:15 PM Medical Record Number: [id number] Patient Account Number: [id number] Date of Birth/Sex: 12-10-1925 (84 y.o. Female) Treating RN: Cornell Barman Primary Care Provider: Lucianne Lei Other Clinician: Referring Provider: Lucianne Lei Treating Provider/Extender: Melburn Hake, Seward Coran Weeks in Treatment: 29 History of Present Illness Location: left anterior shin Quality: Patient reports experiencing a dull pain to affected area(s). Severity: Patient states wound are getting worse. Duration: Patient has had the wound for < 3 weeks prior to presenting for treatment Timing: Pain in wound is Intermittent (comes and goes Context: The wound occurred when the patient blunt trauma against her left shin Modifying Factors: Other treatment(s) tried include:active Bactroban ointment locally Associated Signs and Symptoms: Patient reports having: occasional discharge from the wound HPI Description: 84 year old patient was seen in the ED about 10 days ago for a history of abrasion to the left lower extremity while she was boarding a bus and scraped the anterior part of her left leg. She has been a diabetic for many years and has been taking treatment regularly. Her past medical history is also significant for anemia arthritis constipation and hypertension and is status post abdominal hysterectomy. She has never been a smoker. after her ER visit she was advised to apply Bactroban ointment to the wound twice a day and continue to monitor her blood glucose  levels. her last hemoglobin A1c was done 3 years ago and was 6.6% 06/04/17 patient left anterior shin wound appears to be doing well although it is still somewhat dry despite the treatment with Medihoney. We have been using Kerlex following the Medihoney application and I believe this is just not retaining that much moisture. Nonetheless there is no evidence of infection.  06/11/17 on evaluation today patient appears to be doing a little bit worse in regard to her wound. The entirety of the wound is dry and unfortunately the Medihoney does not seem to be helping this. That is even with the dressing changes that I made last week to try to retain more moisture. She has not tried [employer] as of yet. She was also noncompressible when testing for ABIs. 07/01/2017 -- she had a arterial studies done and was seen by Dr. Lucky Cowboy. her noninvasive studies showed noncompressible vessels on the right but brisk waveforms and digital pressures of 71 on the right consistent with only mild arterial insufficienc. On the left her ABI was 0.47 and this may be falsely elevated due to calcification. No digital pressures were obtained on the left and this is consistent with severe arterial insufficiency. this critical limb threatening situation on the left, would make wound healing difficult and it represents a serious situation and he has recommended an angiography with possible revascularization. The patient desired to defer the decision to she discuss with the family members. 07/09/17 on evaluation today patient appears to be doing fairly well in regard to her left anterior lower extremity wound. She has been tolerating the dressing changes without complication we have been utilizing Santyl at this time. She tells me that she is having no significant pain compared to what she has had in the past. She has decided after discussing with family that she is going to go forward with the surgery with Dr. dew. This is to restore better  blood flow to left lower extremity. With that it does appear that her lower extremity wound is making some progress albeit slow. No fevers, chills, nausea, or vomiting noted at this time. 07/16/17 on evaluation today patient's wound appears to be doing roughly about the same although some of the slough is clearing off. [name] she has her appointment with vascular next week on Thursday the 23rd. With that being said her wound does not appear to be hurting as badly as it has in the past which is good news. No fevers, chills, nausea, or vomiting noted at this time. 07/30/2017 -- she had surgery on 07/22/2017 --indications being nonhealing ulcer on the left leg with a noninvasive study showing marked reduction in ABI of less than 0.5 and no digital pressure on her left leg. she had a percutaneous transluminal angioplasty of the left common and external iliac arteries. She then had a stent placed to the left external iliac arteries and to the left common iliac artery. the left lower extremity showed occlusion of the common femoral artery and the origins of the Stolze, Pamla P. ([id number]) SFA and the profunda femoris artery. The flow distally was poor and there were multiple areas of high-grade stenosis or occlusion of the distal SFA and the popliteal artery with only a diseased peroneal artery as runoff distally. She has a postop appointment on September 23 08/20/2017 -- she went back to the vascular office for an appointment on 08/12/2017 and bilateral ABIs were done which were notable for moderate right lower extremity arterial disease and unable to obtain left ankle brachial indices due to absent Doppler velocities in the posterior tibial artery and the anterior tibial artery. Monophasic waveforms to the left femoral and popliteal artery. this was worrisome for a failure of stents placed and lower extremity angiogram was recommended. as was done on 08/16/2017 and the stents placed in the left eye  Lex system had some mild narrowing at the  iliac bifurcation and the leading edge of the stent proximally was poor wall apposition with stenosis in the 50-60% range in the right common iliac artery. There was also occlusion of the common femoral artery and the origin of the SFA and profunda femoris artery. Flow distally was poor but they appeared to be multiple areas of high-grade stenosis or occlusion in the distal SFA and popliteal artery with only a diseased peroneal artery. in another stent was placed and deployed. The femoral occlusion would need to be treated surgically. 08/27/17 on evaluation today patient appears to be doing about the same in regard to her left for extremity wound. Unfortunately secondary to her vascular status this is not a very well healing wound at this time. She has been tolerating the dressing changes she has some discomfort but mainly with cleansing of the wound not at other times. No fevers, chills, nausea, or vomiting noted at this time. 09/17/2017 -- the patient was recently discharged from the hospital where she was admitted between 09/08/2017 and 09/10/2017, when she had a procedure on her left femoral artery with the endarterectomy and a patch. she will be going back for review this coming week. 09/24/2017 -- was seen on 09/18/2017 in the ER, for bleeding from her wound which was debrided earlier during the day. At the time she was seen there was no active bleeding. A thrombin pad was placed over the area and she was asked to change the outside dressing but come back to the wound center as planned. she was also seen at [facility] for a review after her surgery and bilateral lower extremity ABI when compared to the previous ones showed minimal improvement of arterial blood flow. She was set up for a CT angiogram of the abdomen and pelvis and lower extremities to see the degree of peripheral arterial disease 10/28/2017 -- had a  x-ray of the left tibia and fibula done on 10/07/2017 -- showed no bony acute abnormality she was reviewed by Dr. Corene Cornea dew on 10/26/2017, and he reviewed her CT angiogram and this demonstrated a widely patent left femoral endarterectomy including the origins of the femoral artery. She also had moderate distal disease in the popliteal artery, distal SFA and tibial disease. Her peroneal artery is a dominant runoff distally.he felt she had adequate perfusion for healing the mid calf level and he is putting off all surgical intervention for now and is going to see her back in 2-3 months for noninvasive studies. She also has a new wound on the plantar aspect of the left big toe where she pulled a piece of skin and has caused a superficial ulceration 11/19/17 on evaluation today patient appears to be doing decently well at this point in time. It does appear that her Wound VAC has been approved as ordered by Dr. Con Memos however she has not received that as of yet. Nonetheless the wound bed in general appears to have minimal slough noted at this time in a fairly good amount of granulation although there is to be a noted in the center of the wound. No fevers, chills, nausea, or vomiting noted at this time. 12/02/17 on evaluation today patient appears to be doing about the same in regard to her left anterior shin wound. Unfortunately her left toe wound appears to still be doing about the same as far as the eschar covering. This has softened up a bit to the point that it is now loose and could have selective  debridement which would allow for the Santyl to work better. Fortunately she is having no pain. 12/10/17 on evaluation today patient appears to be doing fairly well in regard to her left anterior shin wound and that it at least appears to be stable. With that being said she does have really no significant healing at this point this wound seems to be mainly just maintaining. Subsequently patient also has the  left toe wound which does not seem to be progressing. There is still nonviable tissue overlying the surface of the wound and this doesn't even seem to be loosening up very well at all. And patient has been using the Santyl. I do think this may need selective debridement today. Fortunately there does not appear to be any evidence of infection although I am concerned about the possibility that patient's blood flow is not likely sufficient to be able to tolerate appropriate wound healing. This was discussed with patient today as well although obviously I would leave the determination up to vascular surgery. She will be seeing Dr. dew on February 5 for repeat vascular studies and then in office Darrington, [name] ([id number]) evaluation. 12/17/17 on evaluation today patient appears to be doing about the same in regard to her left shin and left great toe ulcers. She has been performing the dressing changes as previously recommended over the past week. With that being said unfortunately nothing seems to be changing much in regard to the dimensions of the wound and I do feel again that this may be related to vascular flow. Patient's daughter was present during the evaluation today and we did discuss this as well. I will be detailed in greater detail in the plan. Electronic Signature(s) Signed: 12/20/2017 11:19:49 AM By: Worthy Keeler PA-C Entered By: Worthy Keeler on 12/17/2017 16:57:42 [name], [name] ([id number]) -------------------------------------------------------------------------------- Physical Exam Details Patient Name: [name] P. Date of Service: 12/17/2017 3:15 PM Medical Record Number: [id number] Patient Account Number: [id number] Date of Birth/Sex: May 15, 1926 (84 y.o. Female) Treating RN: Cornell Barman Primary Care Provider: Lucianne Lei Other Clinician: Referring Provider: Lucianne Lei Treating Provider/Extender: Melburn Hake, Devesh Monforte Weeks in Treatment:  57 Constitutional Well-nourished and well-hydrated in no acute distress. Respiratory normal breathing without difficulty. clear to auscultation bilaterally. Cardiovascular regular rate and rhythm with normal S1, S2. Psychiatric this patient is able to make decisions and demonstrates good insight into disease process. Alert and Oriented x 3. pleasant and cooperative. Notes Patient's wound bed appear to be fairly clean in regard to the left shin although there is bone exposed. Subsequently her left great toe wound appears to be covered with slough/eschar and this does not appear to be loosening up very well even with the Santyl. Despite good wound care it appears that her symptoms really are not improving much visually or subjectively. Electronic Signature(s) Signed: 12/20/2017 11:19:49 AM By: Worthy Keeler PA-C Entered By: Worthy Keeler on 12/17/2017 17:00:01 [name], [name] ([id number]) -------------------------------------------------------------------------------- Physician Orders Details Patient Name: [name] P. Date of Service: 12/17/2017 3:15 PM Medical Record Number: [id number] Patient Account Number: [id number] Date of Birth/Sex: 10-14-1926 (84 y.o. 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 o May Shower, gently pat wound dry prior to applying new dressing. Wound #2  Left,Plantar Toe Great o Clean wound with Normal Saline. o May Shower, gently pat wound dry prior to applying new dressing. Primary Wound Dressing Wound #2 Left,Plantar Toe Great o Santyl Ointment Wound #1 Left,Anterior Lower Leg o Mepitel One Contact layer - with Hydrogel over Bone o Promogran Secondary Dressing Wound #1 Left,Anterior Lower Leg o Non-adherent pad o ABD and Kerlix/Conform - tape and stretchnet to secure Wound #2 Left,Plantar Toe Great o Non-adherent pad Dressing Change Frequency Wound #1 Left,Anterior Lower Leg o Change Dressing Monday, Wednesday, Friday Wound #2 Left,Plantar Toe Great o Change dressing every day. Follow-up Appointments Wound #1 Left,Anterior Lower Leg o Return Appointment in 1 week. CARMINE, YOUNGBERG ([id number]) Wound #2 Left,Plantar Toe Great o Return Appointment in 1 week. Home Health Wound #1 [place] [facility] Nurse may visit PRN to address patientos wound care needs. o FACE TO FACE ENCOUNTER: MEDICARE and MEDICAID PATIENTS: I certify that this patient is under my care and that I had a face-to-face encounter that meets the physician face-to-face encounter requirements with this patient on this date. The encounter with the patient was in whole or in part for the following MEDICAL CONDITION: (primary reason for [place]) MEDICAL NECESSITY: I certify, that based on my findings, NURSING services are a medically necessary home health service. HOME BOUND STATUS: I certify that my clinical findings support that this patient is homebound (i.e., Due to illness or injury, pt requires aid of supportive devices such as crutches, cane, wheelchairs, walkers, the use of special transportation or the assistance of another person to leave their place of residence. There is a normal inability to leave the home and doing so requires considerable and taxing  effort. Other absences are for medical reasons / religious services and are infrequent or of short duration when for other reasons). o If current dressing causes regression in wound condition, may D/C ordered dressing product/s and apply Normal Saline Moist Dressing daily until next [place] / Other MD appointment. [place] of regression in wound condition at [phone]. o Please direct any NON-WOUND related issues/requests for orders to patient's Primary Care Physician Wound #2 [place] for [place] Nurse may visit PRN to address patientos wound care needs. o FACE TO FACE ENCOUNTER: MEDICARE and MEDICAID PATIENTS: I certify that this patient is under my care and that I had a face-to-face encounter that meets the physician face-to-face encounter requirements with this patient on this date. The encounter with the patient was in whole or in part for the following MEDICAL CONDITION: (primary reason for [place]) MEDICAL NECESSITY: I certify, that based on my findings, NURSING services are a medically necessary home health service. HOME BOUND STATUS: I certify that my clinical findings support that this patient is homebound (i.e., Due to illness or injury, pt requires aid of supportive devices such as crutches, cane, wheelchairs, walkers, the use of special transportation or the assistance of another person to leave their place of residence. There is a normal inability to leave the home and doing so requires considerable and taxing effort. Other absences are for medical reasons / religious services and are  infrequent or of short duration when for other reasons). o If current dressing causes regression in wound condition, may D/C ordered dressing product/s and apply Normal Saline Moist Dressing daily until next [place] / Other MD appointment. [place] of  regression in wound condition at [phone]. o Please direct any NON-WOUND related issues/requests for orders to patient's Primary Care Physician Negative Pressure Wound Therapy Wound #1 Left,Anterior Lower Leg o Place NPWT on HOLD. Electronic Signature(s) Signed: 12/17/2017 5:36:23 PM By: Gretta Cool, BSN, RN, CWS, Kim RN, BSN Signed: 12/20/2017 11:19:49 AM By: Worthy Keeler PA-C Entered By: Gretta Cool, BSN, RN, CWS, Kim on 12/17/2017 16:53:16 [name], [name] ([id number]) -------------------------------------------------------------------------------- Problem List Details Patient Name: KEIRAH, KONITZER P. Date of Service: 12/17/2017 3:15 PM Medical Record Number: [id number] Patient Account Number: [id number] Date of Birth/Sex: July 22, 1926 (84 y.o. 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 ([id number]) -------------------------------------------------------------------------------- Progress Note Details Patient Name: [name] P. Date of Service: 12/17/2017 3:15 PM Medical Record Number: [id number] Patient Account Number: [id number] Date of Birth/Sex: 1926-03-04 (84 y.o. Female) Treating RN: Cornell Barman Primary Care Provider: Lucianne Lei  Other Clinician: Referring Provider: Lucianne Lei Treating Provider/Extender: Melburn Hake, Anjeanette Petzold Weeks in Treatment: 29 Subjective Chief Complaint Information obtained from Patient Patients presents for treatment of an open diabetic ulcer 2 the left lower anterior leg which she sustained an injury by blunt trauma History of Present Illness (HPI) The following HPI elements were documented for the patient's wound: Location: left anterior shin Quality: Patient reports experiencing a dull pain to affected area(s). Severity: Patient states wound are getting worse. Duration: Patient has had the wound for < 3 weeks prior to presenting for treatment Timing: Pain in wound is Intermittent (comes and goes Context: The wound occurred when the patient blunt trauma against her left shin Modifying Factors: Other treatment(s) tried include:active Bactroban ointment locally Associated Signs and Symptoms: Patient reports having: occasional discharge from the wound 84 year old patient was seen in the ED about 10 days ago for a history of abrasion to the left lower extremity while she was boarding a bus and scraped the anterior part of her left leg. She has been a diabetic for many years and has been taking treatment regularly. Her past medical history is also significant for anemia arthritis constipation and hypertension and is status post abdominal hysterectomy. She has never been a smoker. after her ER visit she was advised to apply Bactroban ointment to the wound twice a day and continue to monitor her blood glucose levels. her last hemoglobin A1c was done 3 years ago and was 6.6% 06/04/17 patient left anterior shin wound appears to be doing well although it is still somewhat dry despite the treatment with Medihoney. We have been using Kerlex following the Medihoney application and I believe this is just not retaining that much moisture. Nonetheless there is no evidence of infection.  06/11/17 on evaluation  today patient appears to be doing a little bit worse in regard to her wound. The entirety of the wound is dry and unfortunately the Medihoney does not seem to be helping this. That is even with the dressing changes that I made last week to try to retain more moisture. She has not tried [employer] as of yet. She was also noncompressible when testing for ABIs. 07/01/2017 -- she had a arterial studies done and was seen by Dr. Lucky Cowboy. her noninvasive studies showed noncompressible vessels on the right but brisk waveforms and digital pressures of 71 on the right consistent with only mild arterial insufficienc. On the left her ABI was 0.47 and this may be falsely elevated due to calcification. No digital pressures were obtained on the left and this is consistent with severe arterial insufficiency. this critical limb threatening situation on the left, would make wound healing difficult and it represents a serious situation and he has recommended an angiography with possible revascularization. The patient desired to defer the decision to she discuss with the family members. 07/09/17 on evaluation today patient appears to be doing fairly well in regard to her left anterior lower extremity wound. She has been tolerating the dressing changes without complication we have been utilizing Santyl at this time. She tells me that she is having no significant pain compared to what she has had in the past. She has decided after discussing with family that she is going to go forward with the surgery with Dr. dew. This is to restore better blood flow to left lower extremity. With that it does appear that her lower extremity wound is making some progress albeit slow. No fevers, chills, nausea, or vomiting noted at this time. BESS, SALTZMAN ([id number]) 07/16/17 on evaluation today patient's wound appears to be doing roughly about the same although some of the slough is clearing off. [name] she has her appointment with vascular  next week on Thursday the 23rd. With that being said her wound does not appear to be hurting as badly as it has in the past which is good news. No fevers, chills, nausea, or vomiting noted at this time. 07/30/2017 -- she had surgery on 07/22/2017 --indications being nonhealing ulcer on the left leg with a noninvasive study showing marked reduction in ABI of less than 0.5 and no digital pressure on her left leg. she had a percutaneous transluminal angioplasty of the left common and external iliac arteries. She then had a stent placed to the left external iliac arteries and to the left common iliac artery. 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 The flow distally was poor and there were multiple areas of high-grade stenosis or occlusion of the distal SFA and the popliteal artery with only a diseased peroneal artery as runoff distally. She has a postop appointment on September 23 08/20/2017 -- she went back to the vascular office for an appointment on 08/12/2017 and bilateral ABIs were done which were notable for moderate right lower extremity arterial disease and unable to obtain left ankle brachial indices due to absent Doppler velocities in the posterior tibial artery and the anterior tibial artery. Monophasic waveforms to the left femoral and popliteal artery. this was worrisome for a failure of stents placed and lower extremity angiogram was recommended. as was done on 08/16/2017 and the stents placed in the left eye Lex system had some mild narrowing at the  iliac bifurcation and the leading edge of the stent proximally was poor wall apposition with stenosis in the 50-60% range in the right common iliac artery. There was also occlusion of the common femoral artery and the origin of the SFA and profunda femoris artery. Flow distally was poor but they appeared to be multiple areas of high-grade stenosis or  occlusion in the distal SFA and popliteal artery with only a diseased peroneal artery. in another stent was placed and deployed. The femoral occlusion would need to be treated surgically. 08/27/17 on evaluation today patient appears to be doing about the same in regard to her left for extremity wound. Unfortunately secondary to her vascular status this is not a very well healing wound at this time. She has been tolerating the dressing changes she has some discomfort but mainly with cleansing of the wound not at other times. No fevers, chills, nausea, or vomiting noted at this time. 09/17/2017 -- the patient was recently discharged from the hospital where she was admitted between 09/08/2017 and 09/10/2017, when she had a procedure on her left femoral artery with the endarterectomy and a patch. she will be going back for review this coming week. 09/24/2017 -- was seen on 09/18/2017 in the ER, for bleeding from her wound which was debrided earlier during the day. At the time she was seen there was no active bleeding. A thrombin pad was placed over the area and she was asked to change the outside dressing but come back to the wound center as planned. she was also seen at the [facility] for a review after her surgery and bilateral lower extremity ABI when compared to the previous ones showed minimal improvement of arterial blood flow. She was set up for a CT angiogram of the abdomen and pelvis and lower extremities to see the degree of peripheral arterial disease 10/28/2017 -- had a x-ray of the left tibia and fibula done on 10/07/2017 -- showed no bony acute abnormality she was reviewed by Dr. Corene Cornea dew on 10/26/2017, and he reviewed her CT angiogram and this demonstrated a widely patent left femoral endarterectomy including the origins of the femoral artery. She also had moderate distal disease in the popliteal artery, distal SFA and tibial disease. Her peroneal artery  is a dominant runoff distally.he felt she had adequate perfusion for healing the mid calf level and he is putting off all surgical intervention for now and is going to see her back in 2-3 months for noninvasive studies. She also has a new wound on the plantar aspect of the left big toe where she pulled a piece of skin and has caused a superficial ulceration 11/19/17 on evaluation today patient appears to be doing decently well at this point in time. It does appear that her Wound VAC has been approved as ordered by Dr. Con Memos however she has not received that as of yet. Nonetheless the wound bed in general appears to have minimal slough noted at this time in a fairly good amount of granulation although there is to be a noted in the center of the wound. No fevers, chills, nausea, or vomiting noted at this time. 12/02/17 on evaluation today patient appears to be doing about the same in regard to her left anterior shin wound. Unfortunately her left toe wound appears to still be doing about the same as far as the eschar covering. This has softened up a bit to the point that it is now loose and could have  selective debridement which would allow for the Santyl to work better. Fortunately she is having no pain. ZANAYAH, SHADOWENS ([id number]) 12/10/17 on evaluation today patient appears to be doing fairly well in regard to her left anterior shin wound and that it at least appears to be stable. With that being said she does have really no significant healing at this point this wound seems to be mainly just maintaining. Subsequently patient also has the left toe wound which does not seem to be progressing. There is still nonviable tissue overlying the surface of the wound and this doesn't even seem to be loosening up very well at all. And patient has been using the Santyl. I do think this may need selective debridement today. Fortunately there does not appear to be any evidence of infection although I am  concerned about the possibility that patient's blood flow is not likely sufficient to be able to tolerate appropriate wound healing. This was discussed with patient today as well although obviously I would leave the determination up to vascular surgery. She will be seeing Dr. dew on February 5 for repeat vascular studies and then in office evaluation. 12/17/17 on evaluation today patient appears to be doing about the same in regard to her left shin and left great toe ulcers. She has been performing the dressing changes as previously recommended over the past week. With that being said unfortunately nothing seems to be changing much in regard to the dimensions of the wound and I do feel again that this may be related to vascular flow. Patient's daughter was present during the evaluation today and we did discuss this as well. I will be detailed in greater detail in the plan. Patient History Information obtained from Patient. Social History Never smoker, Marital Status - Separated, Alcohol Use - Never, Drug Use - No History, Caffeine Use - Rarely. Review of Systems (ROS) Constitutional Symptoms (General Health) Denies complaints or symptoms of Fever, Chills. Respiratory The patient has no complaints or symptoms. Cardiovascular The patient has no complaints or symptoms. Psychiatric The patient has no complaints or symptoms. Objective Constitutional Well-nourished and well-hydrated in no acute distress. Vitals Time Taken: 3:50 PM, Height: 65 in, Weight: 131 lbs, BMI: 21.8, Temperature: 98.2 F, Pulse: 101 bpm, Respiratory Rate: 16 breaths/min, Blood Pressure: 150/64 mmHg. Respiratory normal breathing without difficulty. clear to auscultation bilaterally. Cardiovascular regular rate and rhythm with normal S1, S2. Psychiatric this patient is able to make decisions and demonstrates good insight into disease process. Alert and Oriented x 3. pleasant [name], [name] ([id number]) and  cooperative. General Notes: Patient's wound bed appear to be fairly clean in regard to the left shin although there is bone exposed. Subsequently her left great toe wound appears to be covered with slough/eschar and this does not appear to be loosening up very well even with the Santyl. Despite good wound care it appears that her symptoms really are not improving much visually or subjectively. Integumentary (Hair, Skin) Wound #1 status is Open. Original cause of wound was Trauma. The wound is located on the Left,Anterior Lower Leg. The wound measures 7.5cm length x 3.5cm width x 0.2cm depth; 20.617cm^2 area and 4.123cm^3 volume. There is Fat Layer (Subcutaneous Tissue) Exposed and fascia exposed. There is no tunneling or undermining noted. There is a large amount of serous drainage noted. The wound margin is flat and intact. There is medium (34-66%) red granulation within the wound bed. There is a medium (34-66%) amount of necrotic tissue within the wound bed  including [place]. The periwound skin appearance exhibited: Scarring, Maceration, Hemosiderin Staining, Erythema. The periwound skin appearance did not exhibit: Callus, Crepitus, Excoriation, Induration, Rash, Dry/Scaly, Atrophie Blanche, Cyanosis, Ecchymosis, Mottled, Pallor, Rubor. The surrounding wound skin color is noted with erythema which is circumferential. Periwound temperature was noted as No Abnormality. The periwound has tenderness on palpation. Wound #2 status is Open. Original cause of wound was Trauma. The wound is located on the [employer]. The wound measures 1cm length x 1cm width x 0.2cm depth; 0.785cm^2 area and 0.157cm^3 volume. There is Fat Layer (Subcutaneous Tissue) Exposed exposed. There is no tunneling or undermining noted. There is a large amount of serous drainage noted. The wound margin is flat and intact. There is small (1-33%) red granulation within the wound bed. There is a large (67-100%)  amount of necrotic tissue within the wound bed including Adherent Slough. The periwound skin appearance exhibited: Callus. The periwound skin appearance did not exhibit: Crepitus, Excoriation, Induration, Rash, Scarring, Dry/Scaly, Maceration, Atrophie Blanche, Cyanosis, Ecchymosis, Hemosiderin Staining, Mottled, Pallor, Rubor, Erythema. Periwound temperature was noted as No Abnormality. 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 There was a Non-Selective Chemical/enzymatic debridement (non-viable tissue was removed) performed by STONE III, Millard Bautch E., PA-C.. A time out was conducted at 16:30, prior to the start of the procedure. The procedure was tolerated well with a pain level of 0 throughout and a pain level of 0 following the procedure. Post Debridement Measurements: 1cm length x 1cm width x 0.2cm depth; 0.157cm^3 volume. Character of Wound/Ulcer Post Debridement is stable. Severity of Tissue Post Debridement is: Fat layer exposed. Post procedure Diagnosis Wound #2: Same as Pre-Procedure [name], [name] ([id number]) Plan Wound Cleansing: Wound #1 Left,Anterior Lower Leg: Clean wound with Normal Saline. May Shower, gently pat wound dry prior to applying new dressing. Wound #2 Left,Plantar Toe Great: Clean wound with Normal Saline. May Shower, gently pat wound dry prior to applying new dressing. Primary Wound Dressing: Wound #2 Left,Plantar Toe Great: Santyl Ointment Wound #1 Left,Anterior Lower Leg: Mepitel One  Contact layer - with Hydrogel over Bone Promogran Secondary Dressing: Wound #1 Left,Anterior Lower Leg: Non-adherent pad ABD and Kerlix/Conform - tape and stretchnet to secure Wound #2 Left,Plantar Toe Great: Non-adherent pad Dressing Change Frequency: Wound #1 Left,Anterior Lower Leg: Change Dressing Monday, Wednesday, Friday Wound #2 Left,Plantar Toe Great: Change dressing every day. Follow-up Appointments: Wound #1 Left,Anterior Lower Leg: Return Appointment in 1 week. Wound #2 Left,Plantar Toe Great: Return Appointment in 1 week. Home Health: Wound #1 Left,Anterior Lower Leg: [place] [facility] Nurse may visit PRN to address patient s wound care needs. FACE TO FACE ENCOUNTER: MEDICARE and MEDICAID PATIENTS: I certify that this patient is under my care and that I had a face-to-face encounter that meets the physician face-to-face encounter requirements with this patient on this date. The encounter with the patient was in whole or in part for the following MEDICAL CONDITION: (primary reason for [place]) MEDICAL NECESSITY: I certify, that based on my findings, NURSING services are  a medically necessary home health service. HOME BOUND STATUS: I certify that my clinical findings support that this patient is homebound (i.e., Due to illness or injury, pt requires aid of supportive devices such as crutches, cane, wheelchairs, walkers, the use of special transportation or the assistance of another person to leave their place of residence. There is a normal inability to leave the home and doing so requires considerable and taxing effort. Other absences are for medical reasons / religious services and are infrequent or of short duration when for other reasons). If current dressing causes regression in wound condition, may D/C ordered dressing product/s and apply Normal Saline Moist Dressing daily until next [place] / Other MD appointment.  [place] of regression in wound condition at [phone]. Please direct any NON-WOUND related issues/requests for orders to patient's Primary Care Physician Wound #2 Left,Plantar Toe Great: [place] for [place] Nurse may visit PRN to address patient s wound care needs. FACE TO FACE ENCOUNTER: MEDICARE and MEDICAID PATIENTS: I certify that this patient is under my care and that I had a face-to-face encounter that meets the physician face-to-face encounter requirements with this patient on this date. The encounter with the patient was in whole or in part for the following MEDICAL CONDITION: (primary reason for [place]) MEDICAL NECESSITY: I certify, that based on my findings, NURSING services are a medically necessary home health service. HOME BOUND STATUS: I certify that my clinical findings support that this patient is homebound (i.e., Due to [facility] ([id number]) illness or injury, pt requires aid of supportive devices such as crutches, cane, wheelchairs, walkers, the use of special transportation or the assistance of another person to leave their place of residence. There is a normal inability to leave the home and doing so requires considerable and taxing effort. Other absences are for medical reasons / religious services and are infrequent or of short duration when for other reasons). If current dressing causes regression in wound condition, may D/C ordered dressing product/s and apply Normal Saline Moist Dressing daily until next [place] / Other MD appointment. [place] of regression in wound condition at [phone]. Please direct any NON-WOUND related issues/requests for orders to patient's Primary Care Physician Negative Pressure Wound Therapy: Wound #1 Left,Anterior Lower Leg: Place NPWT on HOLD. At this point I'm going to recommend that no debridement be attempted in regard to the  toe until she has fault evaluation with Dr. dew. Subsequently I really would like Dr. Bunnie Domino opinion on whether or not he feels she has sufficient flow for number one debridement and number two appropriate healing of either one of these wounds. I'm concerned that she may not. Secondly I do think that the Wound VAC is maybe not the best thing for her at this point we are gonna put that on hold at least for the time being. I do think that TheraSkin could be of benefit for her. With that being said my concern there is that again if she does not have sufficient flow then this will likely be not as effective if it even helps at all. All of this was explained to the patient as well as her daughter during the evaluation today. For the time being we're gonna work on trying to keep the bone from drying out and we will initiate the above wound care orders to that end. We will also see how things are next week and then subsequently  what Dr. dew has to say about her vascular status following. Patient and her daughter are in agreement with this plan. Please see above for specific wound care orders. We will see patient for re-evaluation in 1 week(s) here in the clinic. If anything worsens or changes patient will contact our office for additional recommendations. Electronic Signature(s) Signed: 12/20/2017 11:19:49 AM By: Worthy Keeler PA-C Entered By: Worthy Keeler on 12/17/2017 17:00:37 [name], [name] ([id number]) -------------------------------------------------------------------------------- ROS/PFSH Details Patient Name: [name] P. Date of Service: 12/17/2017 3:15 PM Medical Record Number: [id number] Patient Account Number: [id number] Date of Birth/Sex: 1926/08/31 (84 y.o. Female) Treating RN: Cornell Barman Primary Care Provider: Lucianne Lei Other Clinician: Referring Provider: Lucianne Lei Treating Provider/Extender: Melburn Hake, Nathaneil Feagans Weeks in Treatment: 29 Information Obtained  From Patient Wound History Do you currently have one or more open woundso Yes How many open wounds do you currently haveo 1 Approximately how long have you had your woundso 3 weeks How have you been treating your wound(s) until nowo open to air Has your wound(s) ever healed and then re-openedo No Have you had any lab work done in the past montho No Have you tested positive for an antibiotic resistant organism (MRSA, VRE)o No Have you tested positive for osteomyelitis (bone infection)o No Have you had any tests for circulation on your legso No Constitutional Symptoms (General Health) Complaints and Symptoms: Negative for: Fever; Chills Hematologic/Lymphatic Medical History: Positive for: Anemia Respiratory Complaints and Symptoms: No Complaints or Symptoms Cardiovascular Complaints and Symptoms: No Complaints or Symptoms Medical History: Positive for: Hypertension Endocrine Medical History: Positive for: Type II Diabetes Treated with: Oral agents Musculoskeletal Medical History: Positive for: Osteoarthritis Oncologic [name], CAMUS ([id number]) Medical History: Negative for: Received Chemotherapy; Received Radiation Psychiatric Complaints and Symptoms: No Complaints or Symptoms Immunizations Pneumococcal Vaccine: Received Pneumococcal Vaccination: Yes Immunization Notes: up to date Implantable Devices Family and Social History Never smoker; Marital Status - Separated; Alcohol Use: Never; Drug Use: No History; Caffeine Use: Rarely; Financial Concerns: No; Food, Clothing or Shelter Needs: No; Support System Lacking: No; Transportation Concerns: No; Advanced Directives: No; Patient does not want information on Advanced Directives Physician Affirmation I have reviewed and agree with the above information. Electronic Signature(s) Signed: 12/17/2017 5:36:23 PM By: Gretta Cool, BSN, RN, CWS, Kim RN, BSN Signed: 12/20/2017 11:19:49 AM By: Worthy Keeler PA-C Entered By:  Worthy Keeler on 12/17/2017 16:58:21 [name], [name] ([id number]) -------------------------------------------------------------------------------- SuperBill Details Patient Name: [name] P. Date of Service: 12/17/2017 Medical Record Number: [id number] Patient Account Number: [id number] Date of Birth/Sex: 12/16/1925 (84 y.o. 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 of left calf with fat layer exposed Facility Procedures CPT4 Code: 56314970 Description: 26378 - DEBRIDE W/O ANES NON SELECT Modifier: Quantity: 1 Physician Procedures CPT4 Code: 5885027 Description: 74128 - WC PHYS LEVEL 3 - EST PT ICD-10 Diagnosis Description E11.622 Type 2 diabetes mellitus with other skin ulcer L97.522 Non-pressure chronic ulcer of other part of left foot with fa I70.242 Atherosclerosis of native arteries of left leg  with ulceratio L97.222 Non-pressure chronic ulcer of left calf with fat layer expose Modifier: t layer  exposed n of calf d Quantity: 1 Electronic Signature(s) Signed: 12/20/2017 11:19:49 AM By: Worthy Keeler PA-C Entered By: Worthy Keeler on 12/17/2017 17:01:40

## 2017-12-20 NOTE — Progress Notes (Signed)
KANG, ISHIDA ([id number]) Visit Report for 12/17/2017 Arrival Information Details Patient Name: [name], [name]. Date of Service: 12/17/2017 3:15 PM Medical Record Number: [id number] Patient Account Number: [id number] Date of Birth/Sex: [birth year] (84 y.o. Female) Treating RN: Cornell Barman Primary Care Kahlee Metivier: Lucianne Lei Other Clinician: Referring Aleasha Fregeau: Lucianne Lei Treating Lashara Urey/Extender: Melburn Hake, HOYT Weeks in Treatment: 36 Visit Information History Since Last Visit Added or deleted any medications: No Patient Arrived: Cane Any new allergies or adverse reactions: No Arrival Time: 15:50 Had a fall or experienced change in No Accompanied By: grandaughter activities of daily living that may affect Transfer Assistance: None risk of falls: Patient Identification Verified: Yes Signs or symptoms of abuse/neglect since last visito No Secondary Verification Process Completed: Yes Hospitalized since last visit: No Patient Requires Transmission-Based No Has Dressing in Place as Prescribed: Yes Precautions: Pain Present Now: No Patient Has Alerts: Yes Electronic Signature(s) Signed: 12/17/2017 5:36:23 PM By: Gretta Cool, BSN, RN, CWS, Kim RN, BSN Entered By: Gretta Cool, BSN, RN, CWS, Kim on 12/17/2017 15:50:28 North, [name] ([id number]) -------------------------------------------------------------------------------- Encounter Discharge Information Details Patient Name: [name]. Date of Service: 12/17/2017 3:15 PM Medical Record Number: [id number] Patient Account Number: [id number] Date of Birth/Sex: 1926-03-09 (84 y.o. Female) Treating RN: Cornell Barman Primary Care Graysen Depaula: Lucianne Lei Other Clinician: Referring Brandey Vandalen: Lucianne Lei Treating Devondre Guzzetta/Extender: Melburn Hake, HOYT Weeks in Treatment: 29 Encounter Discharge Information Items Discharge Pain Level: 0 Discharge Condition: Stable Ambulatory Status: Ambulatory Discharge Destination:  Home Transportation: Private Auto Accompanied By: self Schedule Follow-up Appointment: Yes Medication Reconciliation completed and Yes provided to Patient/Care Hindy Perrault: Patient Clinical Summary of Care: Declined Electronic Signature(s) Signed: 12/17/2017 5:36:23 PM By: Gretta Cool, BSN, RN, CWS, Kim RN, BSN Entered By: Gretta Cool, BSN, RN, CWS, Kim on 12/17/2017 16:57:17 Lant, [name] ([id number]) -------------------------------------------------------------------------------- Lower Extremity Assessment Details Patient Name: [name]. Date of Service: 12/17/2017 3:15 PM Medical Record Number: [id number] Patient Account Number: [id number] Date of Birth/Sex: June 17, 1926 (84 y.o. Female) Treating RN: Cornell Barman Primary Care Mckaylin Bastien: Lucianne Lei Other Clinician: Referring Bralyn Espino: Lucianne Lei Treating Kadance Mccuistion/Extender: Melburn Hake, HOYT Weeks in Treatment: 29 Vascular Assessment Claudication: Claudication Assessment [Left:None] Pulses: Dorsalis Pedis Palpable: [Left:Yes] Posterior Tibial Extremity colors, hair growth, and conditions: Extremity Color: [Left:Hyperpigmented] Hair Growth on Extremity: [Left:No] Temperature of Extremity: [Left:Warm] Capillary Refill: [Left:< 3 seconds] Toe Nail Assessment Left: Right: Thick: Yes Discolored: Yes Deformed: Yes Improper Length and Hygiene: No Electronic Signature(s) Signed: 12/17/2017 5:36:23 PM By: Gretta Cool, BSN, RN, CWS, Kim RN, BSN Entered By: Gretta Cool, BSN, RN, CWS, Kim on 12/17/2017 16:00:03 [name], [name] ([id number]) -------------------------------------------------------------------------------- Multi Wound Chart Details Patient Name: [name]. Date of Service: 12/17/2017 3:15 PM Medical Record Number: [id number] Patient Account Number: [id number] Date of Birth/Sex: 01/12/1926 (84 y.o. 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 N/A Lower Extremity Secondary Etiology: Trauma, Other N/A N/A Comorbid History: Anemia, Hypertension, Type II Anemia, Hypertension, Type II N/A Diabetes, Osteoarthritis Diabetes, Osteoarthritis Date Acquired: 05/02/2017 10/28/2017 N/A Weeks of Treatment: 29 7 N/A Wound Status: Open Open N/A Measurements L x W x D 7.5x3.5x0.2 1x1x0.2 N/A (cm) Area (cm) :  20.617 0.785 N/A Volume (cm) : 4.123 0.157 N/A % Reduction in Area: -328.90% -256.80% N/A % Reduction in Volume: -329.00% -613.60% N/A Classification: Grade 2 Full Thickness Without N/A Exposed Support Structures Exudate Amount: Large Large N/A Exudate Type: Serous Serous N/A Exudate Color: amber amber N/A Wound Margin: Flat and Intact Flat and Intact N/A Granulation Amount: Medium (34-66%) Small (1-33%) N/A Granulation Quality: Red Red N/A Necrotic Amount: Medium (34-66%) Large (67-100%) N/A Exposed Structures: Fascia: Yes Fat Layer (Subcutaneous N/A Fat Layer (Subcutaneous Tissue) Exposed: Yes Tissue) Exposed: Yes Fascia: No Tendon: No Tendon: No Muscle: No Muscle: No [name], [name]. ([id number]) Joint: No Joint: No Bone: No Bone: No Epithelialization: Small (1-33%) None N/A Periwound Skin Texture: Scarring: Yes Callus: Yes N/A Excoriation: No Excoriation: No Induration: No Induration: No Callus: No Crepitus: No Crepitus: No Rash: No Rash: No Scarring: No Periwound Skin Moisture: Maceration: Yes Maceration: No N/A Dry/Scaly: No Dry/Scaly: No Periwound  Skin Color: Erythema: Yes Atrophie Blanche: No N/A Hemosiderin Staining: Yes Cyanosis: No Atrophie Blanche: No Ecchymosis: No Cyanosis: No Erythema: No Ecchymosis: No Hemosiderin Staining: No Mottled: No Mottled: No Pallor: No Pallor: No Rubor: No Rubor: No Erythema Location: Circumferential N/A N/A Temperature: No Abnormality No Abnormality N/A Tenderness on Palpation: Yes No N/A Wound Preparation: Ulcer Cleansing: Ulcer Cleansing: N/A Rinsed/Irrigated with Saline Rinsed/Irrigated with Saline Topical Anesthetic Applied: Topical Anesthetic Applied: Other: lidocaine 4% Other: lidocaine 4% Treatment Notes Electronic Signature(s) Signed: 12/17/2017 5:36:23 PM By: Gretta Cool, BSN, RN, CWS, Kim RN, BSN Entered By: Gretta Cool, BSN, RN, CWS, Kim on 12/17/2017 16:00:17 Alexa Perches, [name] ([id number]) -------------------------------------------------------------------------------- Multi-Disciplinary Care Plan Details Patient Name: [name]. Date of Service: 12/17/2017 3:15 PM Medical Record Number: [id number] Patient Account Number: [id number] Date of Birth/Sex: May 09, 1926 (84 y.o. Female) Treating RN: Cornell Barman Primary Care Kazumi Lachney: Lucianne Lei Other Clinician: Referring Giordan Fordham: Lucianne Lei Treating Marriah Sanderlin/Extender: Melburn Hake, HOYT Weeks in Treatment: 29 Active Inactive ` Abuse / Safety / Falls / Self Care Management Nursing Diagnoses: Potential for falls Goals: Patient will remain injury free related to falls Date Initiated: 05/28/2017 Target Resolution Date: 07/23/2017 Goal Status: Active Interventions: Assess fall risk on admission and as needed Notes: ` Orientation to the Wound Care Program Nursing Diagnoses: Knowledge deficit related to the wound healing center program Goals: Patient/caregiver will verbalize understanding of the [facility] Date Initiated: 05/28/2017 Target Resolution Date: 07/23/2017 Goal Status:  Active Interventions: Provide education on orientation to the wound center Notes: ` Wound/Skin Impairment Nursing Diagnoses: Impaired tissue integrity Goals: Ulcer/skin breakdown will have a volume reduction of 30% by week 4 Date Initiated: 05/28/2017 Target Resolution Date: 07/23/2017 Goal Status: Active Ulcer/skin breakdown will have a volume reduction of 50% by week 8 Date Initiated: 05/28/2017 Target Resolution Date: 07/23/2017 [name], [name] ([id number]) Goal Status: Active Ulcer/skin breakdown will have a volume reduction of 80% by week 12 Date Initiated: 05/28/2017 Target Resolution Date: 07/23/2017 Goal Status: Active Ulcer/skin breakdown will heal within 14 weeks Date Initiated: 05/28/2017 Target Resolution Date: 07/23/2017 Goal Status: Active Interventions: Assess patient/caregiver ability to obtain necessary supplies Assess patient/caregiver ability to perform ulcer/skin care regimen upon admission and as needed Assess ulceration(s) every visit Notes: Electronic Signature(s) Signed: 12/17/2017 5:36:23 PM By: Gretta Cool, BSN, RN, CWS, Kim RN, BSN Entered By: Gretta Cool, BSN, RN, CWS, Kim on 12/17/2017 16:00:09 [name], [name] ([id number]) -------------------------------------------------------------------------------- Pain Assessment Details Patient Name: [name]. Date of Service: 12/17/2017 3:15 PM Medical Record Number: [id number] Patient Account Number: [id number] Date of Birth/Sex:  November 07, 1926 (84 y.o. Female) Treating RN: Cornell Barman Primary Care Reis Goga: Lucianne Lei Other Clinician: Referring Alis Sawchuk: Lucianne Lei Treating Taurus Willis/Extender: Melburn Hake, HOYT Weeks in Treatment: 29 Active Problems Location of Pain Severity and Description of Pain Patient Has Paino No Site Locations With Dressing Change: No Pain Management and Medication Current Pain Management: Goals for Pain Management Topical or injectable lidocaine is offered to patient for acute  pain when surgical debridement is performed. If needed, Patient is instructed to use over the counter pain medication for the following 24-48 hours after debridement. Wound care MDs do not prescribed pain medications. Patient has chronic pain or uncontrolled pain. Patient has been instructed to make an appointment with their Primary Care Physician for pain management. Electronic Signature(s) Signed: 12/17/2017 5:36:23 PM By: Gretta Cool, BSN, RN, CWS, Kim RN, BSN Entered By: Gretta Cool, BSN, RN, CWS, Kim on 12/17/2017 15:50:51 [name] ([id number]) -------------------------------------------------------------------------------- Patient/Caregiver Education Details Patient Name: [name]. Date of Service: 12/17/2017 3:15 PM Medical Record Number: [id number] Patient Account Number: [id number] Date of Birth/Gender: 1926-04-02 (84 y.o. Female) Treating RN: Cornell Barman Primary Care Physician: Lucianne Lei Other Clinician: Referring Physician: Lucianne Lei Treating Physician/Extender: Sharalyn Ink in Treatment: 51 Education Assessment Education Provided To: Patient Education Topics Provided Wound/Skin Impairment: Handouts: Caring for Your Ulcer Methods: Demonstration Responses: State content correctly Electronic Signature(s) Signed: 12/17/2017 5:36:23 PM By: Gretta Cool, BSN, RN, CWS, Kim RN, BSN Entered By: Gretta Cool, BSN, RN, CWS, Kim on 12/17/2017 16:57:26 [name], [name] ([id number]) -------------------------------------------------------------------------------- Wound Assessment Details Patient Name: [name]. Date of Service: 12/17/2017 3:15 PM Medical Record Number: [id number] Patient Account Number: [id number] Date of Birth/Sex: 13-Apr-1926 (84 y.o. Female) Treating RN: Cornell Barman Primary Care Nikayla Madaris: Lucianne Lei Other Clinician: Referring Ansleigh Safer: Lucianne Lei Treating Aniyiah Zell/Extender: Melburn Hake, HOYT Weeks in Treatment: 29 Wound Status Wound Number:  1 Primary Etiology: Diabetic Wound/Ulcer of the Lower Extremity Wound Location: Left Lower Leg - Anterior Secondary Trauma, Other Wounding Event: Trauma Etiology: Date Acquired: 05/02/2017 Wound Status: Open Weeks Of Treatment: 29 Comorbid Anemia, Hypertension, Type II Diabetes, Clustered Wound: No History: Osteoarthritis Photos Wound Measurements Length: (cm) 7.5 Width: (cm) 3.5 Depth: (cm) 0.2 Area: (cm) 20.617 Volume: (cm) 4.123 % Reduction in Area: -328.9% % Reduction in Volume: -329% Epithelialization: Small (1-33%) Tunneling: No Undermining: No Wound Description Classification: Grade 2 Foul Odor Wound Margin: Flat and Intact Slough/Fi Exudate Amount: Large Exudate Type: Serous Exudate Color: amber After Cleansing: No brino Yes Wound Bed Granulation Amount: Medium (34-66%) Exposed Structure Granulation Quality: Red Fascia Exposed: Yes Necrotic Amount: Medium (34-66%) Fat Layer (Subcutaneous Tissue) Exposed: Yes Necrotic Quality: Adherent Slough Tendon Exposed: No Muscle Exposed: No Joint Exposed: No Bone Exposed: No Periwound Skin Texture Texture Color No Abnormalities Noted: No No Abnormalities Noted: No Callus: No Atrophie Blanche: No [name], [name]. ([id number]) Crepitus: No Cyanosis: No Excoriation: No Ecchymosis: No Induration: No Erythema: Yes Rash: No Erythema Location: Circumferential Scarring: Yes Hemosiderin Staining: Yes Mottled: No Moisture Pallor: No No Abnormalities Noted: No Rubor: No Dry / Scaly: No Maceration: Yes Temperature / Pain Temperature: No Abnormality Tenderness on Palpation: Yes Wound Preparation Ulcer Cleansing: Rinsed/Irrigated with Saline Topical Anesthetic Applied: Other: lidocaine 4%, Treatment Notes Wound #1 (Left, Anterior Lower Leg) 1. Cleansed with: Clean wound with Normal Saline 2. Anesthetic Topical Lidocaine 4% cream to wound bed prior to debridement 4. Dressing Applied: Promogran Other  dressing (specify in notes) Notes Leg: Mepitel with hydrogel over bone, Promogran, stretch netting #4 Toe: santly, coverlet Electronic  Signature(s) Signed: 12/17/2017 5:36:23 PM By: Gretta Cool, BSN, RN, CWS, Kim RN, BSN Entered By: Gretta Cool, BSN, RN, CWS, Kim on 12/17/2017 15:58:14 Alexa Perches, [name] ([id number]) -------------------------------------------------------------------------------- Wound Assessment Details Patient Name: [name]. Date of Service: 12/17/2017 3:15 PM Medical Record Number: [id number] Patient Account Number: [id number] Date of Birth/Sex: 1926-03-02 (84 y.o. Female) Treating RN: Cornell Barman Primary Care Nargis Abrams: Lucianne Lei Other Clinician: Referring Valori Hollenkamp: Lucianne Lei Treating Roel Douthat/Extender: Melburn Hake, HOYT Weeks in Treatment: 29 Wound Status Wound Number: 2 Primary Arterial Insufficiency Ulcer Etiology: Wound Location: Left Toe Great - Plantar Wound Status: Open Wounding Event: Trauma Comorbid Anemia, Hypertension, Type II Diabetes, Date Acquired: 10/28/2017 History: Osteoarthritis Weeks Of Treatment: 7 Clustered Wound: No Photos Wound Measurements Length: (cm) 1 Width: (cm) 1 Depth: (cm) 0.2 Area: (cm) 0.785 Volume: (cm) 0.157 % Reduction in Area: -256.8% % Reduction in Volume: -613.6% Epithelialization: None Tunneling: No Undermining: No Wound Description Full Thickness Without Exposed Support Foul Odo Classification: Structures Slough/F Wound Margin: Flat and Intact Exudate Large Amount: Exudate Type: Serous Exudate Color: amber r After Cleansing: No ibrino Yes Wound Bed Granulation Amount: Small (1-33%) Exposed Structure Granulation Quality: Red Fascia Exposed: No Necrotic Amount: Large (67-100%) Fat Layer (Subcutaneous Tissue) Exposed: Yes Necrotic Quality: Adherent Slough Tendon Exposed: No Muscle Exposed: No Joint Exposed: No Bone Exposed: No Periwound Skin Texture Texture Color No Abnormalities Noted:  No No Abnormalities Noted: No [name], [name]. ([id number]) Callus: Yes Atrophie Blanche: No Crepitus: No Cyanosis: No Excoriation: No Ecchymosis: No Induration: No Erythema: No Rash: No Hemosiderin Staining: No Scarring: No Mottled: No Pallor: No Moisture Rubor: No No Abnormalities Noted: No Dry / Scaly: No Temperature / Pain Maceration: No Temperature: No Abnormality Wound Preparation Ulcer Cleansing: Rinsed/Irrigated with Saline Topical Anesthetic Applied: Other: lidocaine 4%, Treatment Notes Wound #2 (Left, Plantar Toe Great) 1. Cleansed with: Clean wound with Normal Saline 2. Anesthetic Topical Lidocaine 4% cream to wound bed prior to debridement 4. Dressing Applied: Promogran Other dressing (specify in notes) Notes Leg: Mepitel with hydrogel over bone, Promogran, stretch netting #4 Toe: santly, coverlet Electronic Signature(s) Signed: 12/17/2017 5:36:23 PM By: Gretta Cool, BSN, RN, CWS, Kim RN, BSN Entered By: Gretta Cool, BSN, RN, CWS, Kim on 12/17/2017 15:59:18 [name], [name] ([id number]) -------------------------------------------------------------------------------- Vitals Details Patient Name: [name]. Date of Service: 12/17/2017 3:15 PM Medical Record Number: [id number] Patient Account Number: [id number] Date of Birth/Sex: May 18, 1926 (84 y.o. Female) Treating RN: Cornell Barman Primary Care Trella Thurmond: Lucianne Lei Other Clinician: Referring Jahni Paul: Lucianne Lei Treating Ermel Verne/Extender: Melburn Hake, HOYT Weeks in Treatment: 29 Vital Signs Time Taken: 15:50 Temperature (F): 98.2 Height (in): 65 Pulse (bpm): 101 Weight (lbs): 131 Respiratory Rate (breaths/min): 16 Body Mass Index (BMI): 21.8 Blood Pressure (mmHg): 150/64 Reference Range: 80 - 120 mg / dl Electronic Signature(s) Signed: 12/17/2017 5:36:23 PM By: Gretta Cool, BSN, RN, CWS, Kim RN, BSN Entered By: Gretta Cool, BSN, RN, CWS, Kim on 12/17/2017 15:51:08

## 2017-12-23 ENCOUNTER — Emergency Department: Payer: Medicare Other

## 2017-12-23 ENCOUNTER — Other Ambulatory Visit: Payer: Self-pay

## 2017-12-23 ENCOUNTER — Inpatient Hospital Stay: Payer: Medicare Other | Admitting: Anesthesiology

## 2017-12-23 ENCOUNTER — Inpatient Hospital Stay
Admission: EM | Admit: 2017-12-23 | Discharge: 2017-12-28 | DRG: 853 | Disposition: A | Discharge status: Home Health | Payer: Medicare Other | Attending: Internal Medicine | Admitting: Internal Medicine

## 2017-12-23 ENCOUNTER — Encounter
Admission: EM | Disposition: A | Discharge status: Home Health | Payer: Self-pay | Source: Home / Self Care | Attending: Internal Medicine

## 2017-12-23 ENCOUNTER — Encounter: Payer: Self-pay | Admitting: Emergency Medicine

## 2017-12-23 ENCOUNTER — Encounter: Payer: Medicare Other | Admitting: Nurse Practitioner

## 2017-12-23 DIAGNOSIS — L97529 Non-pressure chronic ulcer of other part of left foot with unspecified severity: Secondary | ICD-10-CM | POA: Diagnosis present

## 2017-12-23 DIAGNOSIS — A480 Gas gangrene: Secondary | ICD-10-CM | POA: Diagnosis present

## 2017-12-23 DIAGNOSIS — Z79899 Other long term (current) drug therapy: Secondary | ICD-10-CM | POA: Diagnosis not present

## 2017-12-23 DIAGNOSIS — E1152 Type 2 diabetes mellitus with diabetic peripheral angiopathy with gangrene: Secondary | ICD-10-CM | POA: Diagnosis present

## 2017-12-23 DIAGNOSIS — A419 Sepsis, unspecified organism: Secondary | ICD-10-CM

## 2017-12-23 DIAGNOSIS — L03116 Cellulitis of left lower limb: Secondary | ICD-10-CM | POA: Diagnosis present

## 2017-12-23 DIAGNOSIS — I70203 Unspecified atherosclerosis of native arteries of extremities, bilateral legs: Secondary | ICD-10-CM | POA: Diagnosis present

## 2017-12-23 DIAGNOSIS — E11621 Type 2 diabetes mellitus with foot ulcer: Secondary | ICD-10-CM | POA: Diagnosis present

## 2017-12-23 DIAGNOSIS — I70248 Atherosclerosis of native arteries of left leg with ulceration of other part of lower left leg: Secondary | ICD-10-CM | POA: Diagnosis not present

## 2017-12-23 DIAGNOSIS — L97229 Non-pressure chronic ulcer of left calf with unspecified severity: Secondary | ICD-10-CM | POA: Diagnosis present

## 2017-12-23 DIAGNOSIS — K5900 Constipation, unspecified: Secondary | ICD-10-CM | POA: Diagnosis present

## 2017-12-23 DIAGNOSIS — Z79891 Long term (current) use of opiate analgesic: Secondary | ICD-10-CM | POA: Diagnosis not present

## 2017-12-23 DIAGNOSIS — Z90710 Acquired absence of both cervix and uterus: Secondary | ICD-10-CM | POA: Diagnosis not present

## 2017-12-23 DIAGNOSIS — L03032 Cellulitis of left toe: Secondary | ICD-10-CM | POA: Diagnosis present

## 2017-12-23 DIAGNOSIS — I70262 Atherosclerosis of native arteries of extremities with gangrene, left leg: Secondary | ICD-10-CM | POA: Diagnosis not present

## 2017-12-23 DIAGNOSIS — Z95828 Presence of other vascular implants and grafts: Secondary | ICD-10-CM

## 2017-12-23 DIAGNOSIS — E785 Hyperlipidemia, unspecified: Secondary | ICD-10-CM | POA: Diagnosis present

## 2017-12-23 DIAGNOSIS — Z7902 Long term (current) use of antithrombotics/antiplatelets: Secondary | ICD-10-CM

## 2017-12-23 DIAGNOSIS — Z7982 Long term (current) use of aspirin: Secondary | ICD-10-CM

## 2017-12-23 DIAGNOSIS — Z7984 Long term (current) use of oral hypoglycemic drugs: Secondary | ICD-10-CM

## 2017-12-23 DIAGNOSIS — I10 Essential (primary) hypertension: Secondary | ICD-10-CM | POA: Diagnosis not present

## 2017-12-23 DIAGNOSIS — K219 Gastro-esophageal reflux disease without esophagitis: Secondary | ICD-10-CM | POA: Diagnosis present

## 2017-12-23 DIAGNOSIS — E1140 Type 2 diabetes mellitus with diabetic neuropathy, unspecified: Secondary | ICD-10-CM | POA: Diagnosis present

## 2017-12-23 DIAGNOSIS — D649 Anemia, unspecified: Secondary | ICD-10-CM | POA: Diagnosis not present

## 2017-12-23 DIAGNOSIS — I96 Gangrene, not elsewhere classified: Secondary | ICD-10-CM

## 2017-12-23 HISTORY — PX: AMPUTATION TOE: SHX6595

## 2017-12-23 LAB — CBC WITH DIFFERENTIAL/PLATELET
BASOS ABS: 0 10*3/uL (ref 0–0.1)
Basophils Relative: 0 %
EOS ABS: 0.1 10*3/uL (ref 0–0.7)
EOS PCT: 1 %
HEMATOCRIT: 24.7 % — AB (ref 35.0–47.0)
Hemoglobin: 7.9 g/dL — ABNORMAL LOW (ref 12.0–16.0)
Lymphocytes Relative: 8 %
Lymphs Abs: 1.4 10*3/uL (ref 1.0–3.6)
MCH: 23.6 pg — ABNORMAL LOW (ref 26.0–34.0)
MCHC: 32.1 g/dL (ref 32.0–36.0)
MCV: 73.4 fL — ABNORMAL LOW (ref 80.0–100.0)
MONO ABS: 1.8 10*3/uL — AB (ref 0.2–0.9)
MONOS PCT: 10 %
NEUTROS ABS: 14.4 10*3/uL — AB (ref 1.4–6.5)
Neutrophils Relative %: 81 %
PLATELETS: 509 10*3/uL — AB (ref 150–440)
RBC: 3.36 MIL/uL — ABNORMAL LOW (ref 3.80–5.20)
RDW: 18.9 % — AB (ref 11.5–14.5)
WBC: 17.7 10*3/uL — ABNORMAL HIGH (ref 3.6–11.0)

## 2017-12-23 LAB — COMPREHENSIVE METABOLIC PANEL
ALK PHOS: 68 U/L (ref 38–126)
ALT: 12 U/L — AB (ref 14–54)
ANION GAP: 10 (ref 5–15)
AST: 27 U/L (ref 15–41)
Albumin: 2.9 g/dL — ABNORMAL LOW (ref 3.5–5.0)
BILIRUBIN TOTAL: 0.5 mg/dL (ref 0.3–1.2)
BUN: 14 mg/dL (ref 6–20)
CALCIUM: 9.1 mg/dL (ref 8.9–10.3)
CO2: 25 mmol/L (ref 22–32)
CREATININE: 1.01 mg/dL — AB (ref 0.44–1.00)
Chloride: 102 mmol/L (ref 101–111)
GFR calc Af Amer: 55 mL/min — ABNORMAL LOW (ref >60)
GFR calc non Af Amer: 48 mL/min — ABNORMAL LOW (ref >60)
Glucose, Bld: 112 mg/dL — ABNORMAL HIGH (ref 65–99)
Potassium: 4.2 mmol/L (ref 3.5–5.1)
Sodium: 137 mmol/L (ref 135–145)
TOTAL PROTEIN: 9.7 g/dL — AB (ref 6.5–8.1)

## 2017-12-23 LAB — URINALYSIS, COMPLETE (UACMP) WITH MICROSCOPIC
BACTERIA UA: NONE SEEN
Bilirubin Urine: NEGATIVE
GLUCOSE, UA: NEGATIVE mg/dL
Hgb urine dipstick: NEGATIVE
Ketones, ur: NEGATIVE mg/dL
Leukocytes, UA: NEGATIVE
Nitrite: NEGATIVE
PH: 7 (ref 5.0–8.0)
Protein, ur: NEGATIVE mg/dL
SPECIFIC GRAVITY, URINE: 1.009 (ref 1.005–1.030)

## 2017-12-23 LAB — LACTIC ACID, PLASMA: Lactic Acid, Venous: 2 mmol/L (ref 0.5–1.9)

## 2017-12-23 LAB — SURGICAL PCR SCREEN
MRSA, PCR: POSITIVE — AB
Staphylococcus aureus: POSITIVE — AB

## 2017-12-23 SURGERY — AMPUTATION, TOE
Anesthesia: General | Laterality: Left | Wound class: Dirty or Infected

## 2017-12-23 MED ORDER — TRAZODONE HCL 50 MG PO TABS: 25.0000 mg | ORAL_TABLET | Freq: Every evening | ORAL | Status: DC | PRN

## 2017-12-23 MED ORDER — ACETAMINOPHEN 325 MG PO TABS: 650.0000 mg | ORAL_TABLET | Freq: Four times a day (QID) | ORAL | Status: DC | PRN

## 2017-12-23 MED ORDER — VALSARTAN-HYDROCHLOROTHIAZIDE 320-25 MG PO TABS: 1.0000 | ORAL_TABLET | Freq: Every day | ORAL | Status: DC

## 2017-12-23 MED ORDER — PIPERACILLIN-TAZOBACTAM 3.375 G IVPB
3.3750 g | Freq: Three times a day (TID) | INTRAVENOUS | Status: DC
  Administered 2017-12-23 – 2017-12-28 (×13): 3.375 g via INTRAVENOUS
  Filled 2017-12-23 (×11): qty 50

## 2017-12-23 MED ORDER — VANCOMYCIN HCL IN DEXTROSE 1-5 GM/200ML-% IV SOLN
1000.0000 mg | Freq: Once | INTRAVENOUS | Status: AC
  Administered 2017-12-23: 1000 mg via INTRAVENOUS
  Filled 2017-12-23: qty 200

## 2017-12-23 MED ORDER — FENTANYL CITRATE (PF) 100 MCG/2ML IJ SOLN
INTRAMUSCULAR | Status: AC
  Filled 2017-12-23: qty 2

## 2017-12-23 MED ORDER — ONDANSETRON HCL 4 MG/2ML IJ SOLN: 4.0000 mg | Freq: Once | INTRAMUSCULAR | Status: DC | PRN

## 2017-12-23 MED ORDER — VANCOMYCIN HCL IN DEXTROSE 750-5 MG/150ML-% IV SOLN
750.0000 mg | INTRAVENOUS | Status: DC
  Administered 2017-12-24 – 2017-12-27 (×4): 750 mg via INTRAVENOUS
  Filled 2017-12-23 (×4): qty 150

## 2017-12-23 MED ORDER — DOCUSATE SODIUM 100 MG PO CAPS
100.0000 mg | ORAL_CAPSULE | Freq: Two times a day (BID) | ORAL | Status: DC
  Administered 2017-12-24 – 2017-12-28 (×7): 100 mg via ORAL
  Filled 2017-12-23 (×8): qty 1

## 2017-12-23 MED ORDER — PROPOFOL 10 MG/ML IV BOLUS
INTRAVENOUS | Status: AC
  Filled 2017-12-23: qty 20

## 2017-12-23 MED ORDER — ATORVASTATIN CALCIUM 10 MG PO TABS
10.0000 mg | ORAL_TABLET | Freq: Every day | ORAL | Status: DC
  Administered 2017-12-24 – 2017-12-28 (×4): 10 mg via ORAL
  Filled 2017-12-23 (×5): qty 1

## 2017-12-23 MED ORDER — BUPIVACAINE HCL 0.5 % IJ SOLN
INTRAMUSCULAR | Status: DC | PRN
  Administered 2017-12-23: 10 mL

## 2017-12-23 MED ORDER — PIPERACILLIN-TAZOBACTAM 3.375 G IVPB 30 MIN
3.3750 g | Freq: Once | INTRAVENOUS | Status: AC
  Administered 2017-12-23: 3.375 g via INTRAVENOUS
  Filled 2017-12-23: qty 50

## 2017-12-23 MED ORDER — POLYETHYLENE GLYCOL 3350 17 G PO PACK: 17.0000 g | PACK | Freq: Every day | ORAL | Status: DC | PRN

## 2017-12-23 MED ORDER — PANTOPRAZOLE SODIUM 40 MG PO TBEC
40.0000 mg | DELAYED_RELEASE_TABLET | Freq: Every day | ORAL | Status: DC
  Administered 2017-12-25 – 2017-12-28 (×3): 40 mg via ORAL
  Filled 2017-12-23 (×4): qty 1

## 2017-12-23 MED ORDER — ONDANSETRON HCL 4 MG PO TABS: 4.0000 mg | ORAL_TABLET | Freq: Four times a day (QID) | ORAL | Status: DC | PRN

## 2017-12-23 MED ORDER — BUPIVACAINE HCL (PF) 0.5 % IJ SOLN
INTRAMUSCULAR | Status: AC
  Filled 2017-12-23: qty 30

## 2017-12-23 MED ORDER — SODIUM CHLORIDE 0.9 % IV SOLN
INTRAVENOUS | Status: DC
  Administered 2017-12-23 – 2017-12-24 (×3): via INTRAVENOUS

## 2017-12-23 MED ORDER — TRAMADOL HCL 50 MG PO TABS: 50.0000 mg | ORAL_TABLET | Freq: Four times a day (QID) | ORAL | Status: DC | PRN

## 2017-12-23 MED ORDER — IRBESARTAN 150 MG PO TABS
300.0000 mg | ORAL_TABLET | Freq: Every day | ORAL | Status: DC
  Administered 2017-12-25 – 2017-12-28 (×3): 300 mg via ORAL
  Filled 2017-12-23 (×4): qty 2

## 2017-12-23 MED ORDER — SODIUM CHLORIDE 0.9 % IV BOLUS (SEPSIS)
500.0000 mL | Freq: Once | INTRAVENOUS | Status: AC
  Administered 2017-12-23: 500 mL via INTRAVENOUS

## 2017-12-23 MED ORDER — PROPOFOL 10 MG/ML IV BOLUS
INTRAVENOUS | Status: DC | PRN
  Administered 2017-12-23: 100 mg via INTRAVENOUS
  Administered 2017-12-23: 50 mg via INTRAVENOUS

## 2017-12-23 MED ORDER — ONDANSETRON HCL 4 MG/2ML IJ SOLN: 4.0000 mg | Freq: Four times a day (QID) | INTRAMUSCULAR | Status: DC | PRN

## 2017-12-23 MED ORDER — NEOMYCIN-POLYMYXIN B GU 40-200000 IR SOLN
Status: AC
  Filled 2017-12-23: qty 2

## 2017-12-23 MED ORDER — HYDROCHLOROTHIAZIDE 25 MG PO TABS
25.0000 mg | ORAL_TABLET | Freq: Every day | ORAL | Status: DC
Start: 2017-12-24 — End: 2017-12-28
  Administered 2017-12-25 – 2017-12-28 (×3): 25 mg via ORAL
  Filled 2017-12-23 (×4): qty 1

## 2017-12-23 MED ORDER — DEXAMETHASONE SODIUM PHOSPHATE 10 MG/ML IJ SOLN
INTRAMUSCULAR | Status: DC | PRN
  Administered 2017-12-23: 5 mg via INTRAVENOUS

## 2017-12-23 MED ORDER — ASPIRIN EC 81 MG PO TBEC
81.0000 mg | DELAYED_RELEASE_TABLET | Freq: Every day | ORAL | Status: DC
  Administered 2017-12-24 – 2017-12-28 (×4): 81 mg via ORAL
  Filled 2017-12-23 (×5): qty 1

## 2017-12-23 MED ORDER — CLOPIDOGREL BISULFATE 75 MG PO TABS: 75.0000 mg | ORAL_TABLET | Freq: Every day | ORAL | Status: DC

## 2017-12-23 MED ORDER — DEXAMETHASONE SODIUM PHOSPHATE 10 MG/ML IJ SOLN
INTRAMUSCULAR | Status: AC
  Filled 2017-12-23: qty 1

## 2017-12-23 MED ORDER — PROPOFOL 500 MG/50ML IV EMUL
INTRAVENOUS | Status: DC | PRN
  Administered 2017-12-23: 100 ug/kg/min via INTRAVENOUS

## 2017-12-23 MED ORDER — AMLODIPINE BESYLATE 10 MG PO TABS
10.0000 mg | ORAL_TABLET | Freq: Every day | ORAL | Status: DC
  Administered 2017-12-25 – 2017-12-28 (×3): 10 mg via ORAL
  Filled 2017-12-23 (×4): qty 1

## 2017-12-23 MED ORDER — NEOMYCIN-POLYMYXIN B GU 40-200000 IR SOLN
Status: DC | PRN
  Administered 2017-12-23: 2 mL

## 2017-12-23 MED ORDER — ONDANSETRON HCL 4 MG/2ML IJ SOLN
INTRAMUSCULAR | Status: AC
  Filled 2017-12-23: qty 2

## 2017-12-23 MED ORDER — HYDROCODONE-ACETAMINOPHEN 5-325 MG PO TABS
1.0000 | ORAL_TABLET | ORAL | Status: DC | PRN
  Administered 2017-12-26: 1 via ORAL
  Filled 2017-12-23: qty 1

## 2017-12-23 MED ORDER — ACETAMINOPHEN 650 MG RE SUPP: 650.0000 mg | Freq: Four times a day (QID) | RECTAL | Status: DC | PRN

## 2017-12-23 MED ORDER — ONDANSETRON HCL 4 MG/2ML IJ SOLN
INTRAMUSCULAR | Status: DC | PRN
  Administered 2017-12-23: 4 mg via INTRAVENOUS

## 2017-12-23 MED ORDER — BISACODYL 5 MG PO TBEC: 5.0000 mg | DELAYED_RELEASE_TABLET | Freq: Every day | ORAL | Status: DC | PRN

## 2017-12-23 MED ORDER — FENTANYL CITRATE (PF) 100 MCG/2ML IJ SOLN
INTRAMUSCULAR | Status: DC | PRN
  Administered 2017-12-23 (×2): 25 ug via INTRAVENOUS
  Administered 2017-12-23: 50 ug via INTRAVENOUS

## 2017-12-23 MED ORDER — ENOXAPARIN SODIUM 40 MG/0.4ML ~~LOC~~ SOLN
40.0000 mg | SUBCUTANEOUS | Status: DC
  Administered 2017-12-24 – 2017-12-27 (×4): 40 mg via SUBCUTANEOUS
  Filled 2017-12-23 (×4): qty 0.4

## 2017-12-23 MED ORDER — FENTANYL CITRATE (PF) 100 MCG/2ML IJ SOLN: 25.0000 ug | INTRAMUSCULAR | Status: DC | PRN

## 2017-12-23 MED ORDER — CARVEDILOL 3.125 MG PO TABS
6.2500 mg | ORAL_TABLET | Freq: Every day | ORAL | Status: DC
  Administered 2017-12-24 – 2017-12-27 (×4): 6.25 mg via ORAL
  Filled 2017-12-23: qty 1
  Filled 2017-12-23 (×4): qty 2

## 2017-12-23 MED ORDER — VITAMIN D 1000 UNITS PO TABS
1000.0000 [IU] | ORAL_TABLET | Freq: Every day | ORAL | Status: DC
  Administered 2017-12-25 – 2017-12-28 (×3): 1000 [IU] via ORAL
  Filled 2017-12-23 (×4): qty 1

## 2017-12-23 SURGICAL SUPPLY — 51 items
BANDAGE ACE 4X5 VEL STRL LF (GAUZE/BANDAGES/DRESSINGS) ×3 IMPLANT
BANDAGE STRETCH 3X4.1 STRL (GAUZE/BANDAGES/DRESSINGS) ×2 IMPLANT
BLADE MED AGGRESSIVE (BLADE) IMPLANT
BLADE OSC/SAGITTAL MD 5.5X18 (BLADE) ×1 IMPLANT
BLADE SURG 15 STRL LF DISP TIS (BLADE) ×2 IMPLANT
BLADE SURG 15 STRL SS (BLADE) ×6
BLADE SURG MINI STRL (BLADE) ×3 IMPLANT
BNDG CMPR 75X21 PLY HI ABS (MISCELLANEOUS) ×1
BNDG ESMARK 4X12 TAN STRL LF (GAUZE/BANDAGES/DRESSINGS) ×3 IMPLANT
BNDG GAUZE 4.5X4.1 6PLY STRL (MISCELLANEOUS) ×3 IMPLANT
CANISTER SUCT 1200ML W/VALVE (MISCELLANEOUS) ×1 IMPLANT
CANISTER SUCT 3000ML (MISCELLANEOUS) ×4 IMPLANT
CLOSURE WOUND 1/4X4 (GAUZE/BANDAGES/DRESSINGS)
CUFF TOURN 18 STER (MISCELLANEOUS) IMPLANT
CUFF TOURN DUAL PL 12 NO SLV (MISCELLANEOUS) ×3 IMPLANT
DRAPE FLUOR MINI C-ARM 54X84 (DRAPES) IMPLANT
DURAPREP 26ML APPLICATOR (WOUND CARE) ×3 IMPLANT
ELECT REM PT RETURN 9FT ADLT (ELECTROSURGICAL) ×3
ELECTRODE REM PT RTRN 9FT ADLT (ELECTROSURGICAL) ×1 IMPLANT
GAUZE PETRO XEROFOAM 1X8 (MISCELLANEOUS) ×3 IMPLANT
GAUZE SPONGE 4X4 12PLY STRL (GAUZE/BANDAGES/DRESSINGS) ×3 IMPLANT
GAUZE STRETCH 2X75IN STRL (MISCELLANEOUS) ×3 IMPLANT
GAUZE XEROFORM 4X4 STRL (GAUZE/BANDAGES/DRESSINGS) ×3 IMPLANT
GLOVE BIO SURGEON STRL SZ7.5 (GLOVE) ×6 IMPLANT
GLOVE INDICATOR 8.0 STRL GRN (GLOVE) ×5 IMPLANT
GOWN STRL REUS W/ TWL LRG LVL3 (GOWN DISPOSABLE) ×2 IMPLANT
GOWN STRL REUS W/TWL LRG LVL3 (GOWN DISPOSABLE) ×6
HANDPIECE VERSAJET DEBRIDEMENT (MISCELLANEOUS) ×3 IMPLANT
KIT RM TURNOVER STRD PROC AR (KITS) ×3 IMPLANT
LABEL OR SOLS (LABEL) ×1 IMPLANT
NDL HYPO 25X1 1.5 SAFETY (NEEDLE) ×2 IMPLANT
NEEDLE FILTER BLUNT 18X 1/2SAF (NEEDLE) ×2
NEEDLE FILTER BLUNT 18X1 1/2 (NEEDLE) ×1 IMPLANT
NEEDLE HYPO 25X1 1.5 SAFETY (NEEDLE) ×3 IMPLANT
NS IRRIG 500ML POUR BTL (IV SOLUTION) ×3 IMPLANT
PACK EXTREMITY ARMC (MISCELLANEOUS) ×3 IMPLANT
SOL .9 NS 3000ML IRR  AL (IV SOLUTION) ×2
SOL .9 NS 3000ML IRR AL (IV SOLUTION) ×1
SOL .9 NS 3000ML IRR UROMATIC (IV SOLUTION) ×1 IMPLANT
SOL PREP PVP 2OZ (MISCELLANEOUS) ×3
SOLUTION PREP PVP 2OZ (MISCELLANEOUS) ×1 IMPLANT
STOCKINETTE STRL 3IN 960336 (SOFTGOODS) ×2 IMPLANT
STOCKINETTE STRL 6IN 960660 (GAUZE/BANDAGES/DRESSINGS) IMPLANT
STRIP CLOSURE SKIN 1/4X4 (GAUZE/BANDAGES/DRESSINGS) ×1 IMPLANT
SUT ETHILON 3-0 FS-10 30 BLK (SUTURE) ×3
SUT ETHILON 4-0 (SUTURE)
SUT ETHILON 4-0 FS2 18XMFL BLK (SUTURE)
SUTURE EHLN 3-0 FS-10 30 BLK (SUTURE) ×1 IMPLANT
SUTURE ETHLN 4-0 FS2 18XMF BLK (SUTURE) IMPLANT
SWAB DUAL CULTURE TRANS RED ST (MISCELLANEOUS) ×3 IMPLANT
SYR 10ML LL (SYRINGE) ×3 IMPLANT

## 2017-12-23 NOTE — Anesthesia Post-op Follow-up Note (Signed)
Anesthesia QCDR form completed.        

## 2017-12-23 NOTE — Progress Notes (Signed)
Pt admitted from the ER via stretcher without incident per MD order accompanied by family to room 151. Pt and family oriented to room and unit protocols. Pt and family aware that pt is NPO for surgery. Bed alarm placed on pt after pt assisted to void. Will continue to monitor.

## 2017-12-23 NOTE — Anesthesia Preprocedure Evaluation (Signed)
Anesthesia Evaluation  Patient identified by MRN, date of birth, ID band Patient awake    Reviewed: Allergy & Precautions, H&P , NPO status , Patient's Chart, lab work & pertinent test results, reviewed documented beta blocker date and time   History of Anesthesia Complications (+) PONV, PROLONGED EMERGENCE and history of anesthetic complications  Airway Mallampati: II  TM Distance: >3 FB Neck ROM: full    Dental  (+) Teeth Intact   Pulmonary neg pulmonary ROS,           Cardiovascular Exercise Tolerance: Good hypertension, (-) angina+ Peripheral Vascular Disease  (-) CAD, (-) Past MI, (-) Cardiac Stents and (-) CABG (-) dysrhythmias (-) Valvular Problems/Murmurs     Neuro/Psych neg Seizures TIACVA negative psych ROS   GI/Hepatic Neg liver ROS, GERD  Medicated,  Endo/Other  diabetes  Renal/GU Renal diseasenegative Renal ROS  negative genitourinary   Musculoskeletal   Abdominal   Peds  Hematology negative hematology ROS (+) anemia ,   Anesthesia Other Findings Past Medical History: No date: Anemia     Comment:  family unsure of this history No date: Arthritis No date: Chronic ulcer of calf (HCC)     Comment:  Left No date: Constipation No date: Diabetes mellitus without complication (HCC) No date: Dyslipidemia No date: GERD (gastroesophageal reflux disease) No date: Hypertension No date: Peripheral vascular disease (HCC)   Reproductive/Obstetrics negative OB ROS                             Anesthesia Physical  Anesthesia Plan  ASA: III  Anesthesia Plan: General   Post-op Pain Management:    Induction: Intravenous  PONV Risk Score and Plan: 4 or greater and Ondansetron, Dexamethasone and TIVA  Airway Management Planned: LMA  Additional Equipment:   Intra-op Plan:   Post-operative Plan: Extubation in OR  Informed Consent: I have reviewed the patients History and  Physical, chart, labs and discussed the procedure including the risks, benefits and alternatives for the proposed anesthesia with the patient or authorized representative who has indicated his/her understanding and acceptance.     Plan Discussed with: CRNA  Anesthesia Plan Comments:         Anesthesia Quick Evaluation

## 2017-12-23 NOTE — Interval H&P Note (Signed)
History and Physical Interval Note:  12/23/2017 9:53 PM  [place]  has presented today for surgery, with the diagnosis of GAS IN TISSUE  The various methods of treatment have been discussed with the patient and family. After consideration of risks, benefits and other options for treatment, the patient has consented to  Procedure(s): AMPUTATION TOE LEFT GREAT TOE POSSIBLE RAY RESECTION (Left) as a surgical intervention .  The patient's history has been reviewed, patient examined, no change in status, stable for surgery.  I have reviewed the patient's chart and labs.  Questions were answered to the patient's satisfaction.     [name]

## 2017-12-23 NOTE — Anesthesia Procedure Notes (Signed)
Procedure Name: LMA Insertion Date/Time: 12/23/2017 10:35 PM Performed by: Jonna Clark, CRNA Pre-anesthesia Checklist: Patient identified, Patient being monitored, Timeout performed, Emergency Drugs available and Suction available Patient Re-evaluated:Patient Re-evaluated prior to induction Oxygen Delivery Method: Circle system utilized Preoxygenation: Pre-oxygenation with 100% oxygen Induction Type: IV induction Ventilation: Mask ventilation without difficulty LMA: LMA inserted LMA Size: 3.0 Tube type: Oral Number of attempts: 1 Placement Confirmation: positive ETCO2 and breath sounds checked- equal and bilateral Tube secured with: Tape Dental Injury: Teeth and Oropharynx as per pre-operative assessment

## 2017-12-23 NOTE — Progress Notes (Signed)
ANTIBIOTIC CONSULT NOTE - INITIAL  Pharmacy Consult for Zosyn and vancomycin Indication: sepsis  No Known Allergies  Patient Measurements: Height: 5\' 5"  (165.1 cm) Weight: 130 lb (59 kg) IBW/kg (Calculated) : 57 Adjusted Body Weight:   Vital Signs: Temp: 97.9 F (36.6 C) (01/24 1425) Temp Source: Oral (01/24 1425) BP: 129/60 (01/24 1514) Pulse Rate: 92 (01/24 1425) Intake/Output from previous day: No intake/output data recorded. Intake/Output from this shift: No intake/output data recorded.  Labs: Recent Labs    12/23/17 1432  WBC 17.7*  HGB 7.9*  PLT 509*  CREATININE 1.01*   Estimated Creatinine Clearance: 34 mL/min (A) (by C-G formula based on SCr of 1.01 mg/dL (H)). No results for input(s): VANCOTROUGH, VANCOPEAK, VANCORANDOM, GENTTROUGH, GENTPEAK, GENTRANDOM, TOBRATROUGH, TOBRAPEAK, TOBRARND, AMIKACINPEAK, AMIKACINTROU, AMIKACIN in the last 72 hours.   Microbiology: No results found for this or any previous visit (from the past 720 hour(s)).  Medical History: Past Medical History:  Diagnosis Date  . Anemia    family unsure of this history  . Arthritis   . Chronic ulcer of calf (HCC)    Left  . Constipation   . Diabetes mellitus without complication ([place])   . Dyslipidemia   . GERD (gastroesophageal reflux disease)   . Hypertension   . Peripheral vascular disease (HCC)     Medications:  Infusions:  . piperacillin-tazobactam    . piperacillin-tazobactam (ZOSYN)  IV    . sodium chloride    . vancomycin    . [START ON 12/24/2017] vancomycin     Assessment: 89 yof cc toe pain with PMH HTN, DM, PVD. Here from wound care after being referred for necrotic left great toe. Pharmacy consulted to dose Zosyn and vancomycin for sepsis/wound infection.  Goal of Therapy:  Vancomycin trough level 15-20 mcg/ml  Plan:  1. Zosyn 3.375 gm IV Q8H EI 2. Vancomycin 1000 mg IV x 1 in ED followed in approximately 18 hours (stacked dosing) by vancomycin 750 mg IV Q24H,  predicted trough 17 mcg/mL. Pharmacy will continue to follow and adjust as needed to maintain trough 15 to 20 mcg/mL.   Vd 39.6 L, Ke 0.032 hr-1, T1/2 21.4 hr  Laural Benes, Pharm.D., BCPS Clinical Pharmacist 12/23/2017,3:35 PM

## 2017-12-23 NOTE — ED Provider Notes (Signed)
[facility] Emergency Department Provider Note  ____________________________________________   First MD Initiated Contact with Patient 12/23/17 1424     (approximate)  I have reviewed the triage vital signs and the nursing notes.   HISTORY  Chief Complaint Toe Pain   HPI JENNFIER ABDULLA is a 84 y.o. female with a history of hypertension, diabetes and peripheral vascular disease who is presenting to the emergency department for the wound care clinic after being referred for necrotic left great toe.  The patient says that over the past week she has had increased swelling as well as pain to the base of the great toe.  She is on Plavix.  Says that she has been elevating the left lower extremity without any resolution of the swelling.  Denies any fever.  Says she is having pain that is only minimal at this time.    Past Medical History:  Diagnosis Date  . Anemia    family unsure of this history  . Arthritis   . Chronic ulcer of calf (HCC)    Left  . Constipation   . Diabetes mellitus without complication ([place])   . Dyslipidemia   . GERD (gastroesophageal reflux disease)   . Hypertension   . Peripheral vascular disease [facility])     Patient Active Problem List   Diagnosis Date Noted  . PAD (peripheral artery disease) ([place]) 09/21/2017  . Atherosclerosis of artery of extremity with ulceration ([place]) 09/08/2017  . Atherosclerosis of native arteries of the extremities with ulceration ([place]) 06/25/2017  . Stroke ([place]) 12/31/2013  . TIA (transient ischemic attack) 12/31/2013  . Acute ischemic stroke ([place]) 12/31/2013  . ARF (acute renal failure) ([place]) 12/31/2013  . Hypertension   . Diabetes mellitus without complication ([place])   . Dyslipidemia   . Constipation   . Anemia     Past Surgical History:  Procedure Laterality Date  . ABDOMINAL HYSTERECTOMY    . ENDARTERECTOMY FEMORAL Left 09/08/2017   Procedure: ENDARTERECTOMY FEMORAL WITH CORMATRIX PATCH;   Surgeon: Algernon Huxley, MD;  Location: [facility] ORS;  Service: Vascular;  Laterality: Left;  . EYE SURGERY Bilateral    Cataract Extraction with IOL  . LOWER EXTREMITY ANGIOGRAPHY Left 07/22/2017   Procedure: Lower Extremity Angiography;  Surgeon: Algernon Huxley, MD;  Location: [place] CV LAB;  Service: Cardiovascular;  Laterality: Left;  . LOWER EXTREMITY ANGIOGRAPHY Left 08/16/2017   Procedure: Lower Extremity Angiography;  Surgeon: Algernon Huxley, MD;  Location: [place] CV LAB;  Service: Cardiovascular;  Laterality: Left;  Marland Kitchen MULTIPLE TOOTH EXTRACTIONS      Prior to Admission medications   Medication Sig Start Date End Date Taking? Authorizing Provider  amLODipine (NORVASC) 10 MG tablet Take 10 mg by mouth daily.    [provider]  aspirin EC 81 MG tablet Take 81 mg by mouth daily.    [provider]  atorvastatin (LIPITOR) 10 MG tablet Take 10 mg by mouth daily.    [provider]  Calcium-Magnesium-Zinc (CAL-MAG-ZINC PO) Take 1 tablet by mouth daily.    [provider]  carvedilol (COREG) 6.25 MG tablet Take 6.25 mg by mouth at bedtime.    [provider]  Cholecalciferol (VITAMIN D3 GUMMIES ADULT PO) Take 1 tablet by mouth daily.    [provider]  clopidogrel (PLAVIX) 75 MG tablet Take 1 tablet (75 mg total) by mouth daily with breakfast. 01/01/14   Barton Dubois, MD  HYDROcodone-acetaminophen (NORCO) 7.5-325 MG tablet TK 1  T PO  Q 6 TO 8 H PRN PAIN 08/27/17   [provider]  metFORMIN (GLUCOPHAGE) 1000 MG tablet Take 1,000 mg by mouth 2 (two) times daily with a meal.    [provider]  pantoprazole (PROTONIX) 20 MG tablet Take 1 tablet (20 mg total) by mouth daily. 07/13/14   Billy Fischer, MD  polyethylene glycol (MIRALAX / [name]) packet Take 17 g by mouth daily as needed (for constipation.).     [provider]  SANTYL ointment Apply 1 application topically daily. Applied to wound of leg and dress  wound 06/14/17   [provider]  traMADol (ULTRAM) 50 MG tablet Take 1 tablet (50 mg total) by mouth every 6 (six) hours as needed for moderate pain. 09/10/17   Algernon Huxley, MD  valsartan-hydrochlorothiazide (DIOVAN-HCT) 320-25 MG tablet Take 1 tablet by mouth daily. 08/05/17   [provider]    Allergies Patient has no known allergies.  No family history on file.  Social History Social History   Tobacco Use  . Smoking status: Never Smoker  . Smokeless tobacco: Never Used  Substance Use Topics  . Alcohol use: No  . Drug use: No    Review of Systems  Constitutional: No fever/chills Eyes: No visual changes. ENT: No sore throat. Cardiovascular: Denies chest pain. Respiratory: Denies shortness of breath. Gastrointestinal: No abdominal pain.  No nausea, no vomiting.  No diarrhea.  No constipation. Genitourinary: Negative for dysuria. Musculoskeletal: Negative for back pain. Skin: Negative for rash. Neurological: Negative for headaches, focal weakness or numbness.   ____________________________________________   PHYSICAL EXAM:  VITAL SIGNS: ED Triage Vitals [12/23/17 1425]  Enc Vitals Group     BP (!) 148/62     Pulse Rate 92     Resp 16     Temp 97.9 F (36.6 C)     Temp Source Oral     SpO2 100 %     Weight 130 lb (59 kg)     Height 5\' 5"  (1.651 m)     Head Circumference      Peak Flow      Pain Score      Pain Loc      Pain Edu?      Excl. in [place]?     Constitutional: Alert and oriented. Well appearing and in no acute distress. Eyes: Conjunctivae are normal.  Head: Atraumatic. Nose: No congestion/rhinnorhea. Mouth/Throat: Mucous membranes are moist.  Neck: No stridor.   Cardiovascular: Normal rate, regular rhythm. Grossly normal heart sounds.   Respiratory: Normal respiratory effort.  No retractions. Lungs CTAB. Gastrointestinal: Soft and nontender. No distention.  Musculoskeletal: Left great toe which is black to the dorsum.  There is  also a 2-1/2 cm circular ulcerated lesion to the medial plantar surface which has an overlying black eschar.  The toe is dry.  The patient is insensate to light touch.  To the right great toe but is sensate over the foot.  I am unable to palpate a strong dorsalis pedis nor posterior tibial pulse.  However, the patient is sensate over the foot and right lower extremity.  There is moderate edema to the foot extending up to just distal to the right knee.  There is also discoloration of the left foot which is dark but not black.  There is also ulceration to the anterior proximal third of the shin which is ulcerated down to bone.  There is about 5 cm of exposed bone with  surrounding, pink cannulation tissue without any active bleeding.  No pus.  No pus also to the right great toe.  There is no crepitus and pain is not out of proportion to exam.  In fact, the patient has no objective signs of tenderness/pain upon palpation of the right foot or great toe or shin/calf.  Able to range the left great toe. Neurologic:  Normal speech and language. No gross focal neurologic deficits are appreciated. Skin:  Skin is warm, dry and intact. No rash noted. Psychiatric: Mood and affect are normal. Speech and behavior are normal.  ____________________________________________   LABS (all labs ordered are listed, but only abnormal results are displayed)  Labs Reviewed  LACTIC ACID, PLASMA  COMPREHENSIVE METABOLIC PANEL  CBC WITH DIFFERENTIAL/PLATELET  URINALYSIS, COMPLETE (UACMP) WITH MICROSCOPIC   ____________________________________________  EKG   ____________________________________________  RADIOLOGY  Right toe x-ray with gas within the soft tissue of the IP joint of the great toe likely associated to the plantar ulceration.  No significant osseous abnormality. ____________________________________________   PROCEDURES  Procedure(s) performed:   Procedures  Critical Care performed:    ____________________________________________   INITIAL IMPRESSION / ASSESSMENT AND PLAN / ED COURSE  Pertinent labs & imaging results that were available during my care of the patient were reviewed by me and considered in my medical decision making (see chart for details).  DDX: Cellulitis, osteomyelitis, sepsis, peripheral vascular disease, skin ulceration, necrotizing fasciitis As part of my medical decision making, I reviewed the following data within the [place] chart reviewed  ----------------------------------------- 3:20 PM on 12/23/2017 -----------------------------------------  Symptoms appear chronic and progressive.  Necrotic toe with elevated white blood cell count.  Anemia but hemoglobin of 8.7 was found past October.  The hemoglobin will require trending.  No complaints of blood in the stool.  Patient to be ordered broad-spectrum antibiotics and admitted to the hospital.  She is understanding of this plan willing to comply.  Signed out to Dr. Vianne Bulls.      ____________________________________________   FINAL CLINICAL IMPRESSION(S) / ED DIAGNOSES  Necrotic toe.  Left lower extremity cellulitis.    NEW MEDICATIONS STARTED DURING THIS VISIT:  New Prescriptions   No medications on file     Note:  This document was prepared using Dragon voice recognition software and may include unintentional dictation errors.     Orbie Pyo, MD 12/23/17 [id number]

## 2017-12-23 NOTE — H&P (View-Only) (Signed)
Reason for Consult: Gangrene with gas in the tissues left great toe. Referring Physician: [name] is an 84 y.o. female.  HPI: This is an 84 year old female with a history of an ulceration on her left great toe for the last month or 2.  She has been followed at the wound care center.  Also has been seen over the last few months by vascular surgery.  Recently the toe has started to become more discolored and was referred over from the wound care center to the hospital for admission due to the gangrenous changes on the great toe.  Past Medical History:  Diagnosis Date  . Anemia    family unsure of this history  . Arthritis   . Chronic ulcer of calf (HCC)    Left  . Constipation   . Diabetes mellitus without complication ([place])   . Dyslipidemia   . GERD (gastroesophageal reflux disease)   . Hypertension   . Peripheral vascular disease [facility])     Past Surgical History:  Procedure Laterality Date  . ABDOMINAL HYSTERECTOMY    . ENDARTERECTOMY FEMORAL Left 09/08/2017   Procedure: ENDARTERECTOMY FEMORAL WITH CORMATRIX PATCH;  Surgeon: Algernon Huxley, MD;  Location: [facility] ORS;  Service: Vascular;  Laterality: Left;  . EYE SURGERY Bilateral    Cataract Extraction with IOL  . LOWER EXTREMITY ANGIOGRAPHY Left 07/22/2017   Procedure: Lower Extremity Angiography;  Surgeon: Algernon Huxley, MD;  Location: [place] CV LAB;  Service: Cardiovascular;  Laterality: Left;  . LOWER EXTREMITY ANGIOGRAPHY Left 08/16/2017   Procedure: Lower Extremity Angiography;  Surgeon: Algernon Huxley, MD;  Location: [place] CV LAB;  Service: Cardiovascular;  Laterality: Left;  Marland Kitchen MULTIPLE TOOTH EXTRACTIONS      No family history on file.  Social History:  reports that  has never smoked. she has never used smokeless tobacco. She reports that she does not drink alcohol or use drugs.  Allergies: No Known Allergies  Medications:  Scheduled: . [START ON 12/24/2017] amLODipine  10 mg Oral Daily  .  [START ON 12/24/2017] aspirin EC  81 mg Oral Daily  . [START ON 12/24/2017] atorvastatin  10 mg Oral Daily  . carvedilol  6.25 mg Oral QHS  . [START ON 12/24/2017] cholecalciferol  1,000 Units Oral Daily  . docusate sodium  100 mg Oral BID  . enoxaparin (LOVENOX) injection  40 mg Subcutaneous Q24H  . [START ON 12/24/2017] hydrochlorothiazide  25 mg Oral Daily   And  . [START ON 12/24/2017] irbesartan  300 mg Oral Daily  . [START ON 12/24/2017] pantoprazole  40 mg Oral Daily    Results for orders placed or performed during the hospital encounter of 12/23/17 (from the past 48 hour(s))  Lactic acid, plasma     Status: Abnormal   Collection Time: 12/23/17  2:32 PM  Result Value Ref Range   Lactic Acid, Venous 2.0 (HH) 0.5 - 1.9 mmol/L    Comment: CRITICAL RESULT CALLED TO, READ BACK BY AND VERIFIED WITH SUSAN NEAL AT 1515 12/23/17 DAS Performed at [name], [address] [place], [place] [address]   Comprehensive metabolic panel     Status: Abnormal   Collection Time: 12/23/17  2:32 PM  Result Value Ref Range   Sodium 137 135 - 145 mmol/L   Potassium 4.2 3.5 - 5.1 mmol/L   Chloride 102 101 - 111 mmol/L   CO2 25 22 - 32 mmol/L   Glucose, Bld 112 (H) 65 -  99 mg/dL   BUN 14 6 - 20 mg/dL   Creatinine, Ser 1.01 (H) 0.44 - 1.00 mg/dL   Calcium 9.1 8.9 - 10.3 mg/dL   Total Protein 9.7 (H) 6.5 - 8.1 g/dL   Albumin 2.9 (L) 3.5 - 5.0 g/dL   AST 27 15 - 41 U/L   ALT 12 (L) 14 - 54 U/L   Alkaline Phosphatase 68 38 - 126 U/L   Total Bilirubin 0.5 0.3 - 1.2 mg/dL   GFR calc non Af Amer 48 (L) >60 mL/min   GFR calc Af Amer 55 (L) >60 mL/min    Comment: (NOTE) The eGFR has been calculated using the CKD EPI equation. This calculation has not been validated in all clinical situations. eGFR's persistently <60 mL/min signify possible Chronic Kidney Disease.    Anion gap 10 5 - 15    Comment: Performed at [facility], [address] [place], [place] [address]  CBC with  Differential     Status: Abnormal   Collection Time: 12/23/17  2:32 PM  Result Value Ref Range   WBC 17.7 (H) 3.6 - 11.0 K/uL   RBC 3.36 (L) 3.80 - 5.20 MIL/uL   Hemoglobin 7.9 (L) 12.0 - 16.0 g/dL   HCT 24.7 (L) 35.0 - 47.0 %   MCV 73.4 (L) 80.0 - 100.0 fL   MCH 23.6 (L) 26.0 - 34.0 pg   MCHC 32.1 32.0 - 36.0 g/dL   RDW 18.9 (H) 11.5 - 14.5 %   Platelets 509 (H) 150 - 440 K/uL   Neutrophils Relative % 81 %   Neutro Abs 14.4 (H) 1.4 - 6.5 K/uL   Lymphocytes Relative 8 %   Lymphs Abs 1.4 1.0 - 3.6 K/uL   Monocytes Relative 10 %   Monocytes Absolute 1.8 (H) 0.2 - 0.9 K/uL   Eosinophils Relative 1 %   Eosinophils Absolute 0.1 0 - 0.7 K/uL   Basophils Relative 0 %   Basophils Absolute 0.0 0 - 0.1 K/uL    Comment: Performed at [facility], [address], [place], [place] [address]    Dg Toe Great Left  Result Date: 12/23/2017 CLINICAL DATA:  Necrosis at the great toe. Infection. Diabetic ulcer. EXAM: LEFT GREAT TOE COMPARISON:  None. FINDINGS: Gas is present in the medial and lateral aspect of great toe. No radiopaque foreign body is present. There is no osseous erosion. Mild generalized osteopenia is present. IMPRESSION: 1. Gas within the soft tissues at the IP joint of the great toe is likely associated to the plantar ulceration. This is most concerning for soft tissue infection. 2. No significant osseous abnormality associated with the infection. Electronically Signed   By: San Morelle M.D.   On: 12/23/2017 15:00    Review of Systems  Constitutional: Negative for chills and fever.  HENT: Negative.   Eyes: Negative.   Respiratory: Negative.   Cardiovascular: Negative.   Gastrointestinal: Negative for nausea and vomiting.  Genitourinary: Negative.   Musculoskeletal: Negative.   Skin:       Patient relates an ulcer on her left great toe for the past month or 2.  Has been following at the wound care center.  Neurological:       Denies numbness or tingling.   Endo/Heme/Allergies: Negative.   Psychiatric/Behavioral: Negative.    Blood pressure 127/79, pulse 92, temperature 97.9 F (36.6 C), temperature source Oral, resp. rate 16, height _0  (1.651 m), weight 59 kg (130 lb), SpO2 100 %. Physical  Exam  Cardiovascular:  DP pulse could not be clearly palpated bilateral.  PT pulse is 1/4 on the right and thready, barely palpable left.  Musculoskeletal:  Guarded range of motion in the left foot.  Some stiffness in the joints.  Muscle testing deferred.  Neurological:  There does appear to be some loss of protective threshold with a monofilament wire in the forefoot and toes bilateral.  Proprioception still appears to be intact.  Skin:  The skin is thin dry and somewhat atrophic bilateral.  Significant cyanotic change in the entire left lower extremity as compared to the right.  Some darker cyanotic gangrenous changes involving the entire left hallux.  Full-thickness plantar ulceration is noted with minimal drainage.    Assessment/Plan: Assessment: 1.  Gas gangrene left great toe. 2.  Diabetes with some associated neuropathy. 3.  Significant peripheral vascular disease.  Plan: Discussed with the patient and her family members the need for amputation of the great toe to remove the source of infection.  Also discussed the possibility of removing part of her first metatarsal to allow for wound closure.  We will consult vascular surgery as she most likely will need an angiogram with possible intervention.  No guarantees could be given as to the success of the surgery and we did discuss possible risks and complications including continued infection as well as the need for additional surgeries due to nonhealing from her diabetes or peripheral vascular disease.  Also discussed the possible risk of higher amputation.  At this point we are planning for surgery later on tonight.  Consent form for amputation of the left great toe with possible partial ray resection.   Patient is currently n.p.o. although she does confess that she had a small portion of banana in the emergency department.  We will talk with anesthesia to see if this will keep her from having her surgery.  Durward Fortes 12/23/2017, 5:34 PM

## 2017-12-23 NOTE — H&P (Signed)
[facility] [place] NAME: [name]    MR#:  [id number]  DATE OF BIRTH:  1926/07/31  DATE OF ADMISSION:  12/23/2017  PRIMARY CARE PHYSICIAN: Lucianne Lei, MD   REQUESTING/REFERRING PHYSICIAN: Dr. Clearnce Hasten  CHIEF COMPLAINT: Black discoloration of left great toe.   Chief Complaint  Patient presents with  . Toe Pain    HISTORY OF PRESENT ILLNESS:  [name]  is a 84 y.o. female with a known history of renal artery disease, hypertension, hyperlipidemia sent from wound care because of necrotic left great toe.  Noticed that the left great toe became black since 2 days but patient went to wound care today and was sent here.  The patient goes to wound care every week because of a chronic ulcer on the left leg and also left great toe ulcer.  Noticed black discoloration of left great toe associated with swelling, pain for last 2 days.  According to family patient did have some fever but no nausea or vomiting.  Noted to have gas gangrene of the left great toe by x-ray.  Elevated white count up to 17.  Patient her lactic acid is 2, code sepsis started in the emergency room.  Patient has peripheral artery disease, claudication, had a left leg angiogram, stent placement by Dr. Lucky Cowboy in September 2018.  Since then patient takes aspirin, Plavix, statins. PAST MEDICAL HISTORY:   Past Medical History:  Diagnosis Date  . Anemia    family unsure of this history  . Arthritis   . Chronic ulcer of calf (HCC)    Left  . Constipation   . Diabetes mellitus without complication ([place])   . Dyslipidemia   . GERD (gastroesophageal reflux disease)   . Hypertension   . Peripheral vascular disease ([place])     PAST SURGICAL HISTOIRY:   Past Surgical History:  Procedure Laterality Date  . ABDOMINAL HYSTERECTOMY    . ENDARTERECTOMY FEMORAL Left 09/08/2017   Procedure: ENDARTERECTOMY FEMORAL WITH CORMATRIX PATCH;  Surgeon: Algernon Huxley, MD;   Location: [facility] ORS;  Service: Vascular;  Laterality: Left;  . EYE SURGERY Bilateral    Cataract Extraction with IOL  . LOWER EXTREMITY ANGIOGRAPHY Left 07/22/2017   Procedure: Lower Extremity Angiography;  Surgeon: Algernon Huxley, MD;  Location: [facility];  Service: Cardiovascular;  Laterality: Left;  . LOWER EXTREMITY ANGIOGRAPHY Left 08/16/2017   Procedure: Lower Extremity Angiography;  Surgeon: Algernon Huxley, MD;  Location: [facility];  Service: Cardiovascular;  Laterality: Left;  Marland Kitchen MULTIPLE TOOTH EXTRACTIONS      SOCIAL HISTORY:   Social History   Tobacco Use  . Smoking status: Never Smoker  . Smokeless tobacco: Never Used  Substance Use Topics  . Alcohol use: No    FAMILY HISTORY:  No family history on file.  DRUG ALLERGIES:  No Known Allergies  REVIEW OF SYSTEMS:  CONSTITUTIONAL: No fever, fatigue or weakness.  EYES: No blurred or double vision.  EARS, NOSE, AND THROAT: No tinnitus or ear pain.  RESPIRATORY: No cough, shortness of breath, wheezing or hemoptysis.  CARDIOVASCULAR: No chest pain, orthopnea, edema.  GASTROINTESTINAL: No nausea, vomiting, diarrhea or abdominal pain.  GENITOURINARY: No dysuria, hematuria.  ENDOCRINE: No polyuria, nocturia,  HEMATOLOGY: No anemia, easy bruising or bleeding SKIN: No rash or lesion. MUSCULOSKELETAL: Left great toe and swollen NEUROLOGIC: No tingling, numbness, weakness.  PSYCHIATRY: No anxiety or depression.   MEDICATIONS AT HOME:   Prior  to Admission medications   Medication Sig Start Date End Date Taking? Authorizing Provider  amLODipine (NORVASC) 10 MG tablet Take 10 mg by mouth daily.   Yes [provider]  aspirin EC 81 MG tablet Take 81 mg by mouth daily.   Yes [provider]  atorvastatin (LIPITOR) 10 MG tablet Take 10 mg by mouth daily.   Yes [provider]  Calcium-Magnesium-Zinc (CAL-MAG-ZINC PO) Take 1 tablet by mouth daily.   Yes [provider]   carvedilol (COREG) 6.25 MG tablet Take 6.25 mg by mouth at bedtime.   Yes [provider]  Cholecalciferol (VITAMIN D3 GUMMIES ADULT PO) Take 1 tablet by mouth daily.   Yes [provider]  clopidogrel (PLAVIX) 75 MG tablet Take 1 tablet (75 mg total) by mouth daily with breakfast. 01/01/14  Yes Barton Dubois, MD  metFORMIN (GLUCOPHAGE) 1000 MG tablet Take 1,000 mg by mouth 2 (two) times daily with a meal.   Yes [provider]  pantoprazole (PROTONIX) 20 MG tablet Take 1 tablet (20 mg total) by mouth daily. 07/13/14  Yes Kindl, Nelda Severe, MD  polyethylene glycol (MIRALAX / GLYCOLAX) packet Take 17 g by mouth daily as needed (for constipation.).    Yes [provider]  SANTYL ointment Apply 1 application topically daily. Applied to wound of leg and dress wound 06/14/17  Yes [provider]  traMADol (ULTRAM) 50 MG tablet Take 1 tablet (50 mg total) by mouth every 6 (six) hours as needed for moderate pain. 09/10/17  Yes Dew, Erskine Squibb, MD  valsartan-hydrochlorothiazide (DIOVAN-HCT) 320-25 MG tablet Take 1 tablet by mouth daily. 08/05/17  Yes [provider]      VITAL SIGNS:  Blood pressure 127/79, pulse 92, temperature 97.9 F (36.6 C), temperature source Oral, resp. rate 16, height 5\' 5"  (1.651 m), weight 59 kg (130 lb), SpO2 100 %.  PHYSICAL EXAMINATION:  GENERAL:  84 y.o.-year-old patient lying in the bed with no acute distress.  EYES: Pupils equal, round, reactive to light. No scleral icterus. Extraocular muscles intact.  HEENT: Head atraumatic, normocephalic. Oropharynx and nasopharynx clear.  NECK:  Supple, no jugular venous distention. No thyroid enlargement, no tenderness.  LUNGS: Normal breath sounds bilaterally, no wheezing, rales,rhonchi or crepitation. No use of accessory muscles of respiration.  CARDIOVASCULAR: S1, S2 normal. No murmurs, rubs, or gallops.  ABDOMEN: Soft, nontender, nondistended. Bowel sounds present. No organomegaly  or mass.  EXTREMITIES: Left great toe is black and also noted plantar ulcer.  She is also noted to have ulcer on anterior proximal third of shin.  But it looks healthy.  NEUROLOGIC: Cranial nerves II through XII are intact. Muscle strength 5/5 in all extremities. Sensation intact. Gait not checked.  PSYCHIATRIC: The patient is alert and oriented x 3.  SKIN: Left great toe is black, ulcer is present on the plantar surface of the great toe  LABORATORY PANEL:   CBC Recent Labs  Lab 12/23/17 1432  WBC 17.7*  HGB 7.9*  HCT 24.7*  PLT 509*   ------------------------------------------------------------------------------------------------------------------  Chemistries  Recent Labs  Lab 12/23/17 1432  NA 137  K 4.2  CL 102  CO2 25  GLUCOSE 112*  BUN 14  CREATININE 1.01*  CALCIUM 9.1  AST 27  ALT 12*  ALKPHOS 68  BILITOT 0.5   ------------------------------------------------------------------------------------------------------------------  Cardiac Enzymes No results for input(s): TROPONINI in the last 168 hours. ------------------------------------------------------------------------------------------------------------------  RADIOLOGY:  Dg Toe Great Left  Result Date: 12/23/2017 CLINICAL DATA:  Necrosis  at the great toe. Infection. Diabetic ulcer. EXAM: LEFT GREAT TOE COMPARISON:  None. FINDINGS: Gas is present in the medial and lateral aspect of great toe. No radiopaque foreign body is present. There is no osseous erosion. Mild generalized osteopenia is present. IMPRESSION: 1. Gas within the soft tissues at the IP joint of the great toe is likely associated to the plantar ulceration. This is most concerning for soft tissue infection. 2. No significant osseous abnormality associated with the infection. Electronically Signed   By: San Morelle M.D.   On: 12/23/2017 15:00    EKG:   Orders placed or performed during the hospital encounter of 09/02/17  . EKG test  .  EKG test    IMPRESSION AND PLAN:  84 year old female patient with history of peripheral artery disease, hypertension sent from wound care because of black discoloration of the left great toe and now she has great gas gangrene of the left great toe. #1. sepsis present on admission with elevated lactic acid, elevated white count secondary to left great toe cellulitis, gas gangrene: Continue vancomycin, Zosyn, IV fluids, keep her n.p.o. 2.  Left great toe gangrene: Spoke with Dr. Sherren Mocha cline over the phone, he wanted to take her to or today evening.  Continue n.p.o., hold Plavix, continue IV fluids, discussed with patient's family and also the patient. 3.  History of peripheral artery disease, left leg angiogram, stent placement in September, consult vascular again this time to evaluate ischemia  All the records are reviewed and case discussed with ED provider. Management plans discussed with the patient, family and they are in agreement.  CODE STATUS: Full code  TOTAL TIME TAKING CARE OF THIS PATIENT: 55 minutes.    Epifanio Lesches M.D on 12/23/2017 at 4:43 PM  Between 7am to 6pm - Pager - [phone]  After 6pm go to [email or web address] - password [email or web address] [facility]  Office  [phone]  CC: Primary care physician; Lucianne Lei, MD  Note: This dictation was prepared with Dragon dictation along with smaller phrase technology. Any transcriptional errors that result from this process are unintentional.

## 2017-12-23 NOTE — Consult Note (Signed)
[facility] Vascular Consult Note  MRN : [id number]  [name] is a 84 y.o. (11-07-1926) female who presents with chief complaint of  Chief Complaint  Patient presents with  . Toe Pain  .  History of Present Illness: I am asked to see the patient by Dr. Caryl Comes.  The patient is an 84 year old woman well-known to our service who is status post multiple interventions and most recently a left femoral endarterectomy.  She presented to [facility] earlier today because of increased discoloration and gangrenous changes to the left great toe.  She denies pain at the time of my interview.  There is no history of recent trauma.  She had been following with the wound center for her wound care.  She denies fever chills.  Current Facility-Administered Medications  Medication Dose Route Frequency Provider Last Rate Last Dose  . 0.9 %  sodium chloride infusion   Intravenous Continuous Epifanio Lesches, MD 100 mL/hr at 12/23/17 1730    . acetaminophen (TYLENOL) tablet 650 mg  650 mg Oral Q6H PRN Epifanio Lesches, MD       Or  . acetaminophen (TYLENOL) suppository 650 mg  650 mg Rectal Q6H PRN Epifanio Lesches, MD      . Derrill Memo ON 12/24/2017] amLODipine (NORVASC) tablet 10 mg  10 mg Oral Daily Epifanio Lesches, MD      . Derrill Memo ON 12/24/2017] aspirin EC tablet 81 mg  81 mg Oral Daily Epifanio Lesches, MD      . Derrill Memo ON 12/24/2017] atorvastatin (LIPITOR) tablet 10 mg  10 mg Oral Daily Epifanio Lesches, MD      . bisacodyl (DULCOLAX) EC tablet 5 mg  5 mg Oral Daily PRN Epifanio Lesches, MD      . carvedilol (COREG) tablet 6.25 mg  6.25 mg Oral QHS Epifanio Lesches, MD      . Derrill Memo ON 12/24/2017] cholecalciferol (VITAMIN D) tablet 1,000 Units  1,000 Units Oral Daily Epifanio Lesches, MD      . docusate sodium (COLACE) capsule 100 mg  100 mg Oral BID Epifanio Lesches, MD      . enoxaparin (LOVENOX) injection 40 mg  40  mg Subcutaneous Q24H Epifanio Lesches, MD      . Derrill Memo ON 12/24/2017] hydrochlorothiazide (HYDRODIURIL) tablet 25 mg  25 mg Oral Daily Epifanio Lesches, MD       And  . Derrill Memo ON 12/24/2017] irbesartan (AVAPRO) tablet 300 mg  300 mg Oral Daily Epifanio Lesches, MD      . HYDROcodone-acetaminophen (NORCO/VICODIN) 5-325 MG per tablet 1-2 tablet  1-2 tablet Oral Q4H PRN Epifanio Lesches, MD      . ondansetron (ZOFRAN) tablet 4 mg  4 mg Oral Q6H PRN Epifanio Lesches, MD       Or  . ondansetron (ZOFRAN) injection 4 mg  4 mg Intravenous Q6H PRN Epifanio Lesches, MD      . Derrill Memo ON 12/24/2017] pantoprazole (PROTONIX) EC tablet 40 mg  40 mg Oral Daily Epifanio Lesches, MD      . piperacillin-tazobactam (ZOSYN) IVPB 3.375 g  3.375 g Intravenous Q8H Schaevitz, Randall An, MD      . polyethylene glycol (MIRALAX / GLYCOLAX) packet 17 g  17 g Oral Daily PRN Epifanio Lesches, MD      . traMADol Veatrice Bourbon) tablet 50 mg  50 mg Oral Q6H PRN Epifanio Lesches, MD      . traZODone (DESYREL) tablet 25 mg  25 mg Oral QHS PRN Epifanio Lesches,  MD      . Derrill Memo ON 12/24/2017] vancomycin (VANCOCIN) IVPB 750 mg/150 ml premix  750 mg Intravenous Q24H Orbie Pyo, MD        Past Medical History:  Diagnosis Date  . Anemia    family unsure of this history  . Arthritis   . Chronic ulcer of calf (HCC)    Left  . Constipation   . Diabetes mellitus without complication ([place])   . Dyslipidemia   . GERD (gastroesophageal reflux disease)   . Hypertension   . Peripheral vascular disease [facility])     Past Surgical History:  Procedure Laterality Date  . ABDOMINAL HYSTERECTOMY    . ENDARTERECTOMY FEMORAL Left 09/08/2017   Procedure: ENDARTERECTOMY FEMORAL WITH CORMATRIX PATCH;  Surgeon: Algernon Huxley, MD;  Location: [facility] ORS;  Service: Vascular;  Laterality: Left;  . EYE SURGERY Bilateral    Cataract Extraction with IOL  . LOWER EXTREMITY ANGIOGRAPHY Left 07/22/2017    Procedure: Lower Extremity Angiography;  Surgeon: Algernon Huxley, MD;  Location: [place];  Service: Cardiovascular;  Laterality: Left;  . LOWER EXTREMITY ANGIOGRAPHY Left 08/16/2017   Procedure: Lower Extremity Angiography;  Surgeon: Algernon Huxley, MD;  Location: [place] [facility];  Service: Cardiovascular;  Laterality: Left;  Marland Kitchen MULTIPLE TOOTH EXTRACTIONS      Social History Social History   Tobacco Use  . Smoking status: Never Smoker  . Smokeless tobacco: Never Used  Substance Use Topics  . Alcohol use: No  . Drug use: No    Family History No family history on file. No family history of bleeding/clotting disorders, porphyria or autoimmune disease   No Known Allergies   REVIEW OF SYSTEMS (Negative unless checked)  Constitutional: [] Weight loss  [] Fever  [] Chills Cardiac: [] Chest pain   [] Chest pressure   [] Palpitations   [] Shortness of breath when laying flat   [] Shortness of breath at rest   [] Shortness of breath with exertion. Vascular:  [] Pain in legs with walking   [] Pain in legs at rest   [] Pain in legs when laying flat   [] Claudication   [] Pain in feet when walking  [] Pain in feet at rest  [] Pain in feet when laying flat   [] History of DVT   [] Phlebitis   [] Swelling in legs   [] Varicose veins   [x] Non-healing ulcers Pulmonary:   [] Uses home oxygen   [] Productive cough   [] Hemoptysis   [] Wheeze  [] COPD   [] Asthma Neurologic:  [] Dizziness  [] Blackouts   [] Seizures   [] History of stroke   [] History of TIA  [] Aphasia   [] Temporary blindness   [] Dysphagia   [] Weakness or numbness in arms   [] Weakness or numbness in legs Musculoskeletal:  [] Arthritis   [] Joint swelling   [] Joint pain   [] Low back pain Hematologic:  [] Easy bruising  [] Easy bleeding   [] Hypercoagulable state   [] Anemic  [] Hepatitis Gastrointestinal:  [] Blood in stool   [] Vomiting blood  [] Gastroesophageal reflux/heartburn   [] Difficulty swallowing. Genitourinary:  [] Chronic kidney disease   [] Difficult  urination  [] Frequent urination  [] Burning with urination   [] Blood in urine Skin:  [] Rashes   [x] Ulcers   [x] Wounds Psychological:  [] History of anxiety   []  History of major depression.  Physical Examination  Vitals:   12/23/17 1600 12/23/17 1630 12/23/17 1730 12/23/17 2018  BP: 133/67 127/79 135/62 136/75  Pulse:   89 94  Resp:   18 18  Temp:   (!) 97.4 F (36.3 C) 98.5 F (  36.9 C)  TempSrc:   Oral Oral  SpO2:   100% 100%  Weight:   55.5 kg (122 lb 6.4 oz)   Height:   5\' 5"  (1.651 m)    Body mass index is 20.37 kg/m. Gen:  WD/WN, NAD Head: [place]/AT, No temporalis wasting. Prominent temp pulse not noted. Ear/Nose/Throat: Hearing grossly intact, nares w/o erythema or drainage, oropharynx w/o Erythema/Exudate Eyes: Sclera non-icteric, conjunctiva clear Neck: Trachea midline.  No JVD.  Pulmonary:  Good air movement, respirations not labored, equal bilaterally.  Cardiac: RRR, normal S1, S2. Vascular: Gangrenous changes to the left great toe.  Incisional scar left groin well-healed Vessel Right Left  Radial Palpable Palpable  Femoral Palpable Palpable  PT  not palpable  not palpable  DP  not palpable  not palpable   Gastrointestinal: soft, non-tender/non-distended. No guarding/reflex.  Musculoskeletal: M/S 5/5 throughout.  Extremities without ischemic changes.   No edema. Neurologic: Sensation grossly intact in extremities.  Symmetrical.  Speech is fluent. Motor exam as listed above. Psychiatric: Judgment intact, Mood & affect appropriate for pt's clinical situation. Dermatologic: No rashes positive ulcers noted.  No cellulitis  Lymph : No Cervical, Axillary, or Inguinal lymphadenopathy.      CBC Lab Results  Component Value Date   WBC 17.7 (H) 12/23/2017   HGB 7.9 (L) 12/23/2017   HCT 24.7 (L) 12/23/2017   MCV 73.4 (L) 12/23/2017   PLT 509 (H) 12/23/2017    BMET    Component Value Date/Time   NA 137 12/23/2017 1432   K 4.2 12/23/2017 1432   CL 102 12/23/2017  1432   CO2 25 12/23/2017 1432   GLUCOSE 112 (H) 12/23/2017 1432   BUN 14 12/23/2017 1432   CREATININE 1.01 (H) 12/23/2017 1432   CALCIUM 9.1 12/23/2017 1432   GFRNONAA 48 (L) 12/23/2017 1432   GFRAA 55 (L) 12/23/2017 1432   Estimated Creatinine Clearance: 33.1 mL/min (A) (by C-G formula based on SCr of 1.01 mg/dL (H)).  COAG Lab Results  Component Value Date   INR 1.05 09/02/2017    Radiology Dg Toe Great Left  Result Date: 12/23/2017 CLINICAL DATA:  Necrosis at the great toe. Infection. Diabetic ulcer. EXAM: LEFT GREAT TOE COMPARISON:  None. FINDINGS: Gas is present in the medial and lateral aspect of great toe. No radiopaque foreign body is present. There is no osseous erosion. Mild generalized osteopenia is present. IMPRESSION: 1. Gas within the soft tissues at the IP joint of the great toe is likely associated to the plantar ulceration. This is most concerning for soft tissue infection. 2. No significant osseous abnormality associated with the infection. Electronically Signed   By: San Morelle M.D.   On: 12/23/2017 15:00      Assessment/Plan 1.  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  This represents a limb threatening ischemia and places the patient at the risk for limb loss.  Patient should undergo angiography of the lower extremities with the hope for intervention for limb salvage.  The risks and benefits as well as the alternative therapies was discussed in detail with the patient.  All questions were answered.  Patient agrees to proceed with angiography.  The patient will follow up with me in the office after the procedure.   2.  Diabetes mellitus:  Continue hypoglycemic medications as already ordered, these  medications have been reviewed and there are no  changes at this time.  Hgb A1C to be monitored as already arranged by primary service  3.  Hypertension:  Continue antihypertensive medications as already ordered, these medications have been reviewed and there are no changes at this time.  4.  Hyperlipidemia:  Continue statin as ordered and reviewed, no changes at this time    Hortencia Pilar, MD  12/23/2017 8:50 PM    This note was created with Dragon medical transcription system.  Any error is purely unintentional

## 2017-12-23 NOTE — ED Notes (Addendum)
Pt here from wound care with great toe that is black with no feeling. Pt has been seen wound care weekly and had debridement last week. Pt states change started two days ago with less feeling (numbness) but states was having pain last night. Bloods and iv from triage

## 2017-12-23 NOTE — Transfer of Care (Signed)
Immediate Anesthesia Transfer of Care Note  Patient: [name]  Procedure(s) Performed: AMPUTATION TOE LEFT GREAT TOE (Left )  Patient Location: PACU  Anesthesia Type:General  Level of Consciousness: awake, alert  and oriented  Airway & Oxygen Therapy: Patient Spontanous Breathing and Patient connected to face mask oxygen  Post-op Assessment: Report given to RN and Post -op Vital signs reviewed and stable  Post vital signs: Reviewed and stable  Last Vitals:  Vitals:   12/23/17 2330 12/23/17 2335  BP:  137/71  Pulse: 85 84  Resp: (!) 26 (!) 34  Temp:    SpO2: 100% 100%    Last Pain:  Vitals:   12/23/17 2018  TempSrc: Oral         Complications: No apparent anesthesia complications

## 2017-12-23 NOTE — ED Notes (Signed)
Spoke with dr regarding plavix - plavix discontinued for now and surgery can verify when she can restart.

## 2017-12-23 NOTE — Op Note (Signed)
Date of operation: 12/23/2016.  Surgeon: Wynonna Fitzhenry to be [employer] D.P.M.  Preoperative diagnosis: Gangrene left great toe.  Postoperative diagnosis: Same with abscess.  Procedure: Amputation left great toe.  Debridement infected tissue left forefoot.  Anesthesia: LMA with local.  Hemostasis: None.  Estimated blood loss: 5 cc.  Cultures: Deep tissue cultures left foot.  Pathology: Left great toe.  Complications: None other than limited bleeding.  Operative indications: This is an 84 year old female with admission for gangrenous changes to her left great toe.  Decision was made for amputation while awaiting vascular intervention tomorrow.  Operative procedure: Patient was taken to the operating room and placed on the table in the supine position.  Following satisfactory LMA anesthesia the left foot was anesthetized with 10 cc of 0.5% bupivacaine plain around the first metatarsal.  The foot was prepped and draped in the usual sterile fashion.  Attention was then directed to the distal aspect of the left foot where a racquet type incision was made coursing medial to lateral around the base of the great toe.  Incision was carried sharply down to the level of the bone.  A significant amount of purulence was noted along the lateral aspect near the first interspace and a culture was taken for sensitivities.  Dissection was carried back to the metatarsal phalangeal joint where the toe was disarticulated and removed in toto.  Throughout the procedure there was noted to be limited bleeding.  Some significant necrosis and infection was noted along the plantar and lateral aspect of the wound.  This was then excisionally debrided using a rongeur for gross debridement and then a versa jet on a setting of 3 for further debridement of the soft tissues.  The wound was then flushed with copious amounts of sterile saline using a bulb syringe.  The incision was then closed using 3-0 nylon vertical mattress and simple  interrupted sutures.  Xeroform 4 x 4's con form Kerlix and an Ace applied to the left lower extremity.  Patient was awakened and taken to the PACU with vital signs stable and in good condition.

## 2017-12-23 NOTE — ED Triage Notes (Signed)
Pt in via POV; sent over from wound center due to necrosis of great left toe.  Pt reports picking a scab which turned into a diabetic ulcer, being followed by the wound clinic on a weekly basis.  Great left toe necrotic w/ open sore, no discharge noted at this time.  Pt with loss of sensation to toe.  This RN unable to obtain pedal pulse by palpation due to swelling.  Pt is diabetic.  Vitals WDL.

## 2017-12-23 NOTE — Progress Notes (Signed)
CODE SEPSIS - PHARMACY COMMUNICATION  **Broad Spectrum Antibiotics should be administered within 1 hour of Sepsis diagnosis**  Time Code Sepsis Called/Page Received: 15:27  Antibiotics Ordered: Zosyn/Vancomycin  Time of 1st antibiotic administration: 15:42  Additional action taken by pharmacy: none  If necessary, Name of Provider/Nurse Contacted: none   Laural Benes, Pharm.D., BCPS Clinical Pharmacist  12/23/2017  3:34 PM

## 2017-12-23 NOTE — ED Notes (Signed)
Code Sepsis Initiated.

## 2017-12-23 NOTE — Consult Note (Signed)
Reason for Consult: Gangrene with gas in the tissues left great toe. Referring Physician: Konidena  Darrelle P Vane is an 84 y.o. female.  HPI: This is an 84-year-old female with a history of an ulceration on her left great toe for the last month or 2.  She has been followed at the wound care center.  Also has been seen over the last few months by vascular surgery.  Recently the toe has started to become more discolored and was referred over from the wound care center to the hospital for admission due to the gangrenous changes on the great toe.  Past Medical History:  Diagnosis Date  . Anemia    family unsure of this history  . Arthritis   . Chronic ulcer of calf (HCC)    Left  . Constipation   . Diabetes mellitus without complication (HCC)   . Dyslipidemia   . GERD (gastroesophageal reflux disease)   . Hypertension   . Peripheral vascular disease (HCC)     Past Surgical History:  Procedure Laterality Date  . ABDOMINAL HYSTERECTOMY    . ENDARTERECTOMY FEMORAL Left 09/08/2017   Procedure: ENDARTERECTOMY FEMORAL WITH CORMATRIX PATCH;  Surgeon: Dew, Jason S, MD;  Location: [facility] ORS;  Service: Vascular;  Laterality: Left;  . EYE SURGERY Bilateral    Cataract Extraction with IOL  . LOWER EXTREMITY ANGIOGRAPHY Left 07/22/2017   Procedure: Lower Extremity Angiography;  Surgeon: Dew, Jason S, MD;  Location: [facility] INVASIVE CV LAB;  Service: Cardiovascular;  Laterality: Left;  . LOWER EXTREMITY ANGIOGRAPHY Left 08/16/2017   Procedure: Lower Extremity Angiography;  Surgeon: Dew, Jason S, MD;  Location: [facility] INVASIVE CV LAB;  Service: Cardiovascular;  Laterality: Left;  . MULTIPLE TOOTH EXTRACTIONS      No family history on file.  Social History:  reports that  has never smoked. she has never used smokeless tobacco. She reports that she does not drink alcohol or use drugs.  Allergies: No Known Allergies  Medications:  Scheduled: . [START ON 12/24/2017] amLODipine  10 mg Oral Daily  .  [START ON 12/24/2017] aspirin EC  81 mg Oral Daily  . [START ON 12/24/2017] atorvastatin  10 mg Oral Daily  . carvedilol  6.25 mg Oral QHS  . [START ON 12/24/2017] cholecalciferol  1,000 Units Oral Daily  . docusate sodium  100 mg Oral BID  . enoxaparin (LOVENOX) injection  40 mg Subcutaneous Q24H  . [START ON 12/24/2017] hydrochlorothiazide  25 mg Oral Daily   And  . [START ON 12/24/2017] irbesartan  300 mg Oral Daily  . [START ON 12/24/2017] pantoprazole  40 mg Oral Daily    Results for orders placed or performed during the hospital encounter of 12/23/17 (from the past 48 hour(s))  Lactic acid, plasma     Status: Abnormal   Collection Time: 12/23/17  2:32 PM  Result Value Ref Range   Lactic Acid, Venous 2.0 (HH) 0.5 - 1.9 mmol/L    Comment: CRITICAL RESULT CALLED TO, READ BACK BY AND VERIFIED WITH SUSAN NEAL AT 1515 12/23/17 DAS Performed at [facility], [address] [place], [place] [address]   Comprehensive metabolic panel     Status: Abnormal   Collection Time: 12/23/17  2:32 PM  Result Value Ref Range   Sodium 137 135 - 145 mmol/L   Potassium 4.2 3.5 - 5.1 mmol/L   Chloride 102 101 - 111 mmol/L   CO2 25 22 - 32 mmol/L   Glucose, Bld 112 (H) 65 -   99 mg/dL   BUN 14 6 - 20 mg/dL   Creatinine, Ser 1.01 (H) 0.44 - 1.00 mg/dL   Calcium 9.1 8.9 - 10.3 mg/dL   Total Protein 9.7 (H) 6.5 - 8.1 g/dL   Albumin 2.9 (L) 3.5 - 5.0 g/dL   AST 27 15 - 41 U/L   ALT 12 (L) 14 - 54 U/L   Alkaline Phosphatase 68 38 - 126 U/L   Total Bilirubin 0.5 0.3 - 1.2 mg/dL   GFR calc non Af Amer 48 (L) >60 mL/min   GFR calc Af Amer 55 (L) >60 mL/min    Comment: (NOTE) The eGFR has been calculated using the CKD EPI equation. This calculation has not been validated in all clinical situations. eGFR's persistently <60 mL/min signify possible Chronic Kidney Disease.    Anion gap 10 5 - 15    Comment: Performed at [facility], [address] [place], [place] [address]  CBC with  Differential     Status: Abnormal   Collection Time: 12/23/17  2:32 PM  Result Value Ref Range   WBC 17.7 (H) 3.6 - 11.0 K/uL   RBC 3.36 (L) 3.80 - 5.20 MIL/uL   Hemoglobin 7.9 (L) 12.0 - 16.0 g/dL   HCT 24.7 (L) 35.0 - 47.0 %   MCV 73.4 (L) 80.0 - 100.0 fL   MCH 23.6 (L) 26.0 - 34.0 pg   MCHC 32.1 32.0 - 36.0 g/dL   RDW 18.9 (H) 11.5 - 14.5 %   Platelets 509 (H) 150 - 440 K/uL   Neutrophils Relative % 81 %   Neutro Abs 14.4 (H) 1.4 - 6.5 K/uL   Lymphocytes Relative 8 %   Lymphs Abs 1.4 1.0 - 3.6 K/uL   Monocytes Relative 10 %   Monocytes Absolute 1.8 (H) 0.2 - 0.9 K/uL   Eosinophils Relative 1 %   Eosinophils Absolute 0.1 0 - 0.7 K/uL   Basophils Relative 0 %   Basophils Absolute 0.0 0 - 0.1 K/uL    Comment: Performed at [facility], [address] [place],  [address]    Dg Toe Great Left  Result Date: 12/23/2017 CLINICAL DATA:  Necrosis at the great toe. Infection. Diabetic ulcer. EXAM: LEFT GREAT TOE COMPARISON:  None. FINDINGS: Gas is present in the medial and lateral aspect of great toe. No radiopaque foreign body is present. There is no osseous erosion. Mild generalized osteopenia is present. IMPRESSION: 1. Gas within the soft tissues at the IP joint of the great toe is likely associated to the plantar ulceration. This is most concerning for soft tissue infection. 2. No significant osseous abnormality associated with the infection. Electronically Signed   By: Christopher  Mattern M.D.   On: 12/23/2017 15:00    Review of Systems  Constitutional: Negative for chills and fever.  HENT: Negative.   Eyes: Negative.   Respiratory: Negative.   Cardiovascular: Negative.   Gastrointestinal: Negative for nausea and vomiting.  Genitourinary: Negative.   Musculoskeletal: Negative.   Skin:       Patient relates an ulcer on her left great toe for the past month or 2.  Has been following at the wound care center.  Neurological:       Denies numbness or tingling.   Endo/Heme/Allergies: Negative.   Psychiatric/Behavioral: Negative.    Blood pressure 127/79, pulse 92, temperature 97.9 F (36.6 C), temperature source Oral, resp. rate 16, height 5' 5" (1.651 m), weight 59 kg (130 lb), SpO2 100 %. Physical   Exam  Cardiovascular:  DP pulse could not be clearly palpated bilateral.  PT pulse is 1/4 on the right and thready, barely palpable left.  Musculoskeletal:  Guarded range of motion in the left foot.  Some stiffness in the joints.  Muscle testing deferred.  Neurological:  There does appear to be some loss of protective threshold with a monofilament wire in the forefoot and toes bilateral.  Proprioception still appears to be intact.  Skin:  The skin is thin dry and somewhat atrophic bilateral.  Significant cyanotic change in the entire left lower extremity as compared to the right.  Some darker cyanotic gangrenous changes involving the entire left hallux.  Full-thickness plantar ulceration is noted with minimal drainage.    Assessment/Plan: Assessment: 1.  Gas gangrene left great toe. 2.  Diabetes with some associated neuropathy. 3.  Significant peripheral vascular disease.  Plan: Discussed with the patient and her family members the need for amputation of the great toe to remove the source of infection.  Also discussed the possibility of removing part of her first metatarsal to allow for wound closure.  We will consult vascular surgery as she most likely will need an angiogram with possible intervention.  No guarantees could be given as to the success of the surgery and we did discuss possible risks and complications including continued infection as well as the need for additional surgeries due to nonhealing from her diabetes or peripheral vascular disease.  Also discussed the possible risk of higher amputation.  At this point we are planning for surgery later on tonight.  Consent form for amputation of the left great toe with possible partial ray resection.   Patient is currently n.p.o. although she does confess that she had a small portion of banana in the emergency department.  We will talk with anesthesia to see if this will keep her from having her surgery.  Adayah Arocho W Felisha Claytor 12/23/2017, 5:34 PM     

## 2017-12-24 ENCOUNTER — Encounter: Payer: Self-pay | Admitting: Podiatry

## 2017-12-24 DIAGNOSIS — D649 Anemia, unspecified: Secondary | ICD-10-CM

## 2017-12-24 DIAGNOSIS — I70262 Atherosclerosis of native arteries of extremities with gangrene, left leg: Secondary | ICD-10-CM

## 2017-12-24 LAB — CBC
HEMATOCRIT: 22.5 % — AB (ref 35.0–47.0)
Hemoglobin: 7.1 g/dL — ABNORMAL LOW (ref 12.0–16.0)
MCH: 23 pg — AB (ref 26.0–34.0)
MCHC: 31.7 g/dL — ABNORMAL LOW (ref 32.0–36.0)
MCV: 72.5 fL — AB (ref 80.0–100.0)
PLATELETS: 448 10*3/uL — AB (ref 150–440)
RBC: 3.11 MIL/uL — AB (ref 3.80–5.20)
RDW: 18.7 % — AB (ref 11.5–14.5)
WBC: 16.5 10*3/uL — ABNORMAL HIGH (ref 3.6–11.0)

## 2017-12-24 LAB — BASIC METABOLIC PANEL
Anion gap: 8 (ref 5–15)
BUN: 12 mg/dL (ref 6–20)
CHLORIDE: 104 mmol/L (ref 101–111)
CO2: 24 mmol/L (ref 22–32)
CREATININE: 0.86 mg/dL (ref 0.44–1.00)
Calcium: 8.3 mg/dL — ABNORMAL LOW (ref 8.9–10.3)
GFR calc Af Amer: >60 mL/min (ref >60)
GFR calc non Af Amer: 58 mL/min — ABNORMAL LOW (ref >60)
Glucose, Bld: 200 mg/dL — ABNORMAL HIGH (ref 65–99)
POTASSIUM: 3.9 mmol/L (ref 3.5–5.1)
Sodium: 136 mmol/L (ref 135–145)

## 2017-12-24 LAB — PREPARE RBC (CROSSMATCH)

## 2017-12-24 LAB — GLUCOSE, CAPILLARY
GLUCOSE-CAPILLARY: 139 mg/dL — AB (ref 65–99)
GLUCOSE-CAPILLARY: 154 mg/dL — AB (ref 65–99)
GLUCOSE-CAPILLARY: 171 mg/dL — AB (ref 65–99)
GLUCOSE-CAPILLARY: 210 mg/dL — AB (ref 65–99)
GLUCOSE-CAPILLARY: 221 mg/dL — AB (ref 65–99)

## 2017-12-24 LAB — HEMOGLOBIN: HEMOGLOBIN: 8.2 g/dL — AB (ref 12.0–16.0)

## 2017-12-24 MED ORDER — MORPHINE SULFATE (PF) 4 MG/ML IV SOLN: 4.0000 mg | INTRAVENOUS | Status: DC | PRN

## 2017-12-24 MED ORDER — PROMETHAZINE HCL 25 MG PO TABS
12.5000 mg | ORAL_TABLET | ORAL | Status: DC | PRN
  Filled 2017-12-24: qty 1

## 2017-12-24 MED ORDER — HYDROCODONE-ACETAMINOPHEN 5-325 MG PO TABS: 1.0000 | ORAL_TABLET | ORAL | Status: DC | PRN

## 2017-12-24 MED ORDER — SODIUM CHLORIDE 0.9% FLUSH: 3.0000 mL | INTRAVENOUS | Status: DC | PRN

## 2017-12-24 MED ORDER — MORPHINE SULFATE (PF) 2 MG/ML IV SOLN: 2.0000 mg | INTRAVENOUS | Status: DC | PRN

## 2017-12-24 MED ORDER — CHLORHEXIDINE GLUCONATE CLOTH 2 % EX PADS
6.0000 | MEDICATED_PAD | Freq: Every day | CUTANEOUS | Status: DC
  Administered 2017-12-24 – 2017-12-27 (×4): 6 via TOPICAL

## 2017-12-24 MED ORDER — SODIUM CHLORIDE 0.9% FLUSH
3.0000 mL | Freq: Two times a day (BID) | INTRAVENOUS | Status: DC
  Administered 2017-12-24 – 2017-12-27 (×6): 3 mL via INTRAVENOUS

## 2017-12-24 MED ORDER — SODIUM CHLORIDE 0.9 % IV SOLN: 250.0000 mL | INTRAVENOUS | Status: DC | PRN

## 2017-12-24 MED ORDER — TRAZODONE HCL 50 MG PO TABS: 25.0000 mg | ORAL_TABLET | Freq: Every evening | ORAL | Status: DC | PRN

## 2017-12-24 MED ORDER — INSULIN ASPART 100 UNIT/ML ~~LOC~~ SOLN
0.0000 [IU] | Freq: Three times a day (TID) | SUBCUTANEOUS | Status: DC
  Administered 2017-12-24 – 2017-12-25 (×2): 3 [IU] via SUBCUTANEOUS
  Administered 2017-12-25: 1 [IU] via SUBCUTANEOUS
  Administered 2017-12-26: 5 [IU] via SUBCUTANEOUS
  Administered 2017-12-27: 1 [IU] via SUBCUTANEOUS
  Filled 2017-12-24 (×5): qty 1

## 2017-12-24 MED ORDER — OXYCODONE-ACETAMINOPHEN 5-325 MG PO TABS: 1.0000 | ORAL_TABLET | ORAL | Status: DC | PRN

## 2017-12-24 MED ORDER — SODIUM CHLORIDE 0.9 % IV SOLN: Freq: Once | INTRAVENOUS | Status: DC

## 2017-12-24 MED ORDER — POLYETHYLENE GLYCOL 3350 17 G PO PACK: 17.0000 g | PACK | Freq: Every day | ORAL | Status: DC | PRN

## 2017-12-24 MED ORDER — INSULIN ASPART 100 UNIT/ML ~~LOC~~ SOLN
0.0000 [IU] | Freq: Every day | SUBCUTANEOUS | Status: DC
  Administered 2017-12-24 – 2017-12-26 (×2): 2 [IU] via SUBCUTANEOUS
  Administered 2017-12-27: 3 [IU] via SUBCUTANEOUS
  Filled 2017-12-24 (×3): qty 1

## 2017-12-24 MED ORDER — ENOXAPARIN SODIUM 40 MG/0.4ML ~~LOC~~ SOLN: 40.0000 mg | SUBCUTANEOUS | Status: DC

## 2017-12-24 MED ORDER — MUPIROCIN 2 % EX OINT
1.0000 "application " | TOPICAL_OINTMENT | Freq: Two times a day (BID) | CUTANEOUS | Status: DC
  Administered 2017-12-24 – 2017-12-28 (×9): 1 via NASAL
  Filled 2017-12-24 (×2): qty 22

## 2017-12-24 NOTE — NC FL2 (Signed)
[place] LEVEL OF CARE SCREENING TOOL     IDENTIFICATION  Patient Name: [name] Birthdate: [birth year] Sex: female Admission Date (Current Location): 12/23/2017  [facility] and [place] Number:  Selena Lesser ([id number] [phone]) Facility and Address:  [facility], [address], [place], [place] [address]      Provider Number: [id number]  Attending Physician Name and Address:  Fritzi Mandes, MD  Relative Name and Phone Number:       Current Level of Care: Hospital Recommended Level of Care: [place] Prior Approval Number:    Date Approved/Denied:   PASRR Number: [id number])  Discharge Plan: SNF    Current Diagnoses: Patient Active Problem List   Diagnosis Date Noted  . Gangrene of toe of left foot ([place]) 12/23/2017  . PAD (peripheral artery disease) ([place]) 09/21/2017  . Atherosclerosis of artery of extremity with ulceration ([place]) 09/08/2017  . Atherosclerosis of native arteries of the extremities with ulceration ([place]) 06/25/2017  . Stroke ([place]) 12/31/2013  . TIA (transient ischemic attack) 12/31/2013  . Acute ischemic stroke ([place]) 12/31/2013  . ARF (acute renal failure) ([place]) 12/31/2013  . Hypertension   . Diabetes mellitus without complication ([place])   . Dyslipidemia   . Constipation   . Anemia     Orientation RESPIRATION BLADDER Height & Weight     Self, Time, Situation, Place  Normal Continent Weight: 131 lb (59.4 kg) Height:  5\' 5"  (165.1 cm)  BEHAVIORAL SYMPTOMS/MOOD NEUROLOGICAL BOWEL NUTRITION STATUS      Continent Diet(NPO to be advanced. )  AMBULATORY STATUS COMMUNICATION OF NEEDS Skin   Extensive Assist Verbally Surgical wounds(Incision: Left Foot. )                       Personal Care Assistance Level of Assistance  Bathing, Feeding, Dressing Bathing Assistance: Limited assistance Feeding assistance: Independent Dressing Assistance: Limited assistance     Functional Limitations Info   Sight, Hearing, Speech Sight Info: Adequate Hearing Info: Impaired Speech Info: Adequate    SPECIAL CARE FACTORS FREQUENCY  PT (By licensed PT), OT (By licensed OT)     PT Frequency: (5) OT Frequency: (5)            Contractures      Additional Factors Info  Code Status, Allergies, Isolation Precautions Code Status Info: (Full Code. ) Allergies Info: (No Known Allergies. )     Isolation Precautions Info: (MRSA nasal swab. )     Current Medications (12/24/2017):  This is the current hospital active medication list Current Facility-Administered Medications  Medication Dose Route Frequency Provider Last Rate Last Dose  . 0.9 %  sodium chloride infusion   Intravenous Continuous Epifanio Lesches, MD 100 mL/hr at 12/24/17 0654    . 0.9 %  sodium chloride infusion  250 mL Intravenous PRN Epifanio Lesches, MD      . acetaminophen (TYLENOL) tablet 650 mg  650 mg Oral Q6H PRN Epifanio Lesches, MD       Or  . acetaminophen (TYLENOL) suppository 650 mg  650 mg Rectal Q6H PRN Epifanio Lesches, MD      . amLODipine (NORVASC) tablet 10 mg  10 mg Oral Daily Epifanio Lesches, MD      . aspirin EC tablet 81 mg  81 mg Oral Daily Epifanio Lesches, MD      . atorvastatin (LIPITOR) tablet 10 mg  10 mg Oral Daily Epifanio Lesches, MD      . bisacodyl (DULCOLAX) EC tablet  5 mg  5 mg Oral Daily PRN Epifanio Lesches, MD      . carvedilol (COREG) tablet 6.25 mg  6.25 mg Oral QHS Epifanio Lesches, MD      . Chlorhexidine Gluconate Cloth 2 % PADS 6 each  6 each Topical Q0600 Sharlotte Alamo, DPM   6 each at 12/24/17 [phone]  . cholecalciferol (VITAMIN D) tablet 1,000 Units  1,000 Units Oral Daily Epifanio Lesches, MD      . docusate sodium (COLACE) capsule 100 mg  100 mg Oral BID Epifanio Lesches, MD      . enoxaparin (LOVENOX) injection 40 mg  40 mg Subcutaneous Q24H Epifanio Lesches, MD      . hydrochlorothiazide (HYDRODIURIL) tablet 25 mg  25 mg Oral Daily  Epifanio Lesches, MD       And  . irbesartan (AVAPRO) tablet 300 mg  300 mg Oral Daily Epifanio Lesches, MD      . HYDROcodone-acetaminophen (NORCO/VICODIN) 5-325 MG per tablet 1-2 tablet  1-2 tablet Oral Q4H PRN Epifanio Lesches, MD      . morphine 2 MG/ML injection 2 mg  2 mg Intravenous Q2H PRN Sharlotte Alamo, DPM      . morphine 4 MG/ML injection 4 mg  4 mg Intravenous Q2H PRN Epifanio Lesches, MD      . mupirocin ointment (BACTROBAN) 2 % 1 application  1 application Nasal BID Sharlotte Alamo, DPM   1 application at 40/76/80 1040  . ondansetron (ZOFRAN) tablet 4 mg  4 mg Oral Q6H PRN Epifanio Lesches, MD       Or  . ondansetron (ZOFRAN) injection 4 mg  4 mg Intravenous Q6H PRN Epifanio Lesches, MD      . oxyCODONE-acetaminophen (PERCOCET/ROXICET) 5-325 MG per tablet 1-2 tablet  1-2 tablet Oral Q4H PRN Epifanio Lesches, MD      . pantoprazole (PROTONIX) EC tablet 40 mg  40 mg Oral Daily Epifanio Lesches, MD      . piperacillin-tazobactam (ZOSYN) IVPB 3.375 g  3.375 g Intravenous Q8H Orbie Pyo, MD   Stopped at 12/24/17 1047  . polyethylene glycol (MIRALAX / GLYCOLAX) packet 17 g  17 g Oral Daily PRN Epifanio Lesches, MD      . promethazine (PHENERGAN) tablet 12.5 mg  12.5 mg Oral Q4H PRN Epifanio Lesches, MD      . sodium chloride flush (NS) 0.9 % injection 3 mL  3 mL Intravenous Q12H Epifanio Lesches, MD      . sodium chloride flush (NS) 0.9 % injection 3 mL  3 mL Intravenous PRN Epifanio Lesches, MD      . traMADol Veatrice [place]) tablet 50 mg  50 mg Oral Q6H PRN Epifanio Lesches, MD      . traZODone (DESYREL) tablet 25 mg  25 mg Oral QHS PRN Epifanio Lesches, MD      . vancomycin (VANCOCIN) IVPB 750 mg/150 ml premix  750 mg Intravenous Q24H Orbie Pyo, MD   Stopped at 12/24/17 1145     Discharge Medications: Please see discharge summary for a list of discharge medications.  Relevant Imaging Results:  Relevant Lab  Results:   Additional Information (SSN: [id number])  Jachob Mcclean, [name], LCSW

## 2017-12-24 NOTE — Progress Notes (Signed)
1 Day Post-Op   Subjective/Chief Complaint: Patient seen.  Not having any significant pain with her left foot.  Only some mild discomfort.   Objective: Vital signs in last 24 hours: Temp:  [97.4 F (36.3 C)-99.2 F (37.3 C)] 97.9 F (36.6 C) (01/25 1222) Pulse Rate:  [82-95] 90 (01/25 1222) Resp:  [16-34] 18 (01/25 1222) BP: (122-148)/(60-79) 143/74 (01/25 1222) SpO2:  [92 %-100 %] 100 % (01/25 1222) Weight:  [55.5 kg (122 lb 6.4 oz)-59.4 kg (131 lb)] 59.4 kg (131 lb) (01/25 0500) Last BM Date: 12/22/17  Intake/Output from previous day: 01/24 0701 - 01/25 0700 In: 300 [I.V.:300] Out: 301 [Urine:301] Intake/Output this shift: Total I/O In: 1300 [I.V.:1100; IV Piggyback:200] Out: 3 [Urine:2; Stool:1]  The bandage is clean dry and intact.  No evidence of any strikethrough.  Lab Results:  Recent Labs    12/23/17 1432 12/24/17 0358  WBC 17.7* 16.5*  HGB 7.9* 7.1*  HCT 24.7* 22.5*  PLT 509* 448*   BMET Recent Labs    12/23/17 1432 12/24/17 0358  NA 137 136  K 4.2 3.9  CL 102 104  CO2 25 24  GLUCOSE 112* 200*  BUN 14 12  CREATININE 1.01* 0.86  CALCIUM 9.1 8.3*   PT/INR No results for input(s): LABPROT, INR in the last 72 hours. ABG No results for input(s): PHART, HCO3 in the last 72 hours.  Invalid input(s): PCO2, PO2  Studies/Results: Dg Toe Great Left  Result Date: 12/23/2017 CLINICAL DATA:  Necrosis at the great toe. Infection. Diabetic ulcer. EXAM: LEFT GREAT TOE COMPARISON:  None. FINDINGS: Gas is present in the medial and lateral aspect of great toe. No radiopaque foreign body is present. There is no osseous erosion. Mild generalized osteopenia is present. IMPRESSION: 1. Gas within the soft tissues at the IP joint of the great toe is likely associated to the plantar ulceration. This is most concerning for soft tissue infection. 2. No significant osseous abnormality associated with the infection. Electronically Signed   By: San Morelle M.D.   On:  12/23/2017 15:00    Anti-infectives: Anti-infectives (From admission, onward)   Start     Dose/Rate Route Frequency Ordered Stop   12/24/17 1000  vancomycin (VANCOCIN) IVPB 750 mg/150 ml premix     750 mg 150 mL/hr over 60 Minutes Intravenous Every 24 hours 12/23/17 1534     12/23/17 2200  piperacillin-tazobactam (ZOSYN) IVPB 3.375 g     3.375 g 12.5 mL/hr over 240 Minutes Intravenous Every 8 hours 12/23/17 1534     12/23/17 1530  piperacillin-tazobactam (ZOSYN) IVPB 3.375 g     3.375 g 100 mL/hr over 30 Minutes Intravenous  Once 12/23/17 1522 12/23/17 1621   12/23/17 1530  vancomycin (VANCOCIN) IVPB 1000 mg/200 mL premix     1,000 mg 200 mL/hr over 60 Minutes Intravenous  Once 12/23/17 1522 12/23/17 1721      Assessment/Plan: s/p Procedure(s): AMPUTATION TOE LEFT GREAT TOE (Left) Assessment: Stable status post amputation left great toe.  Awaiting angiogram   Plan: Dressing left intact today.  At this point we will plan for dressing change tomorrow.  Spoke with Dr. Delana Meyer who stated that he was going to have to postpone her angiogram until Monday due to low blood count.  Plan for dressing change tomorrow and we will follow her through the weekend and next week.  LOS: 1 day    Durward Fortes 12/24/2017

## 2017-12-24 NOTE — Progress Notes (Signed)
[facility]  Daily Progress Note   Subjective  - 1 Day Post-Op  Patient continues to have pain in her left foot.  At surgery last night pus was encountered along the tendon sheath of the left great toe.  Objective Vitals:   12/24/17 1641 12/24/17 1724 12/24/17 1744 12/24/17 1949  BP: 140/66 137/63 137/65 105/77  Pulse: 95 98 97 94  Resp: 18 17 16 20   Temp: 97.8 F (36.6 C) 97.7 F (36.5 C) 98.8 F (37.1 C) 98.9 F (37.2 C)  TempSrc: Oral Oral Oral Oral  SpO2: 100% 98% 98% 98%  Weight:      Height:        Intake/Output Summary (Last 24 hours) at 12/24/2017 1952 Last data filed at 12/24/2017 1724 Gross per 24 hour  Intake 2250 ml  Output 304 ml  Net 1946 ml    PULM  Normal effort , no use of accessory muscles CV  No JVD, RRR Abd      No distended, nontender VASC  2+ femoral pulse nonpalpable pedal pulses.  Forefoot is dressed  Laboratory CBC    Component Value Date/Time   WBC 16.5 (H) 12/24/2017 0358   HGB 7.1 (L) 12/24/2017 0358   HCT 22.5 (L) 12/24/2017 0358   PLT 448 (H) 12/24/2017 0358    BMET    Component Value Date/Time   NA 136 12/24/2017 0358   K 3.9 12/24/2017 0358   CL 104 12/24/2017 0358   CO2 24 12/24/2017 0358   GLUCOSE 200 (H) 12/24/2017 0358   BUN 12 12/24/2017 0358   CREATININE 0.86 12/24/2017 0358   CALCIUM 8.3 (L) 12/24/2017 0358   GFRNONAA 58 (L) 12/24/2017 0358   GFRAA >60 12/24/2017 0358    Assessment/Planning: 1.  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  This represents a limb threatening ischemia and places the patient at the risk for limb loss.  Patient should undergo angiography of the left lower extremities with the hope for intervention for limb salvage.  The risks and benefits as well as the  alternative therapies was discussed in detail with the patient.    However, angiography was not performed today as planned given her hemoglobin of only 7.1.  Currently she is undergoing transfusion and once her anemia has been adequately treated will plan for angiography on Monday.  2.  Diabetes mellitus:  Continue hypoglycemic medications as already ordered, these medications have been reviewed and there are no changes at this time.  Hgb A1C to be monitored as already arranged by primary service  3.  Hypertension:  Continue antihypertensive medications as already ordered, these medications have been reviewed and there are no changes at this time.  4.  Hyperlipidemia:  Continue statin as ordered and reviewed, no changes at this time     Hortencia Pilar  12/24/2017, 7:52 PM

## 2017-12-24 NOTE — Progress Notes (Signed)
ANTIBIOTIC CONSULT NOTE - INITIAL  Pharmacy Consult for Zosyn and vancomycin Indication: sepsis  No Known Allergies  Patient Measurements: Height: 5\' 5"  (165.1 cm) Weight: 131 lb (59.4 kg) IBW/kg (Calculated) : 57 Adjusted Body Weight:   Vital Signs: Temp: 97.9 F (36.6 C) (01/25 1222) Temp Source: Oral (01/25 1222) BP: 143/74 (01/25 1222) Pulse Rate: 90 (01/25 1222) Intake/Output from previous day: 01/24 0701 - 01/25 0700 In: 300 [I.V.:300] Out: 301 [Urine:301] Intake/Output from this shift: Total I/O In: 1300 [I.V.:1100; IV Piggyback:200] Out: 3 [Urine:2; Stool:1]  Labs: Recent Labs    12/23/17 1432 12/24/17 0358  WBC 17.7* 16.5*  HGB 7.9* 7.1*  PLT 509* 448*  CREATININE 1.01* 0.86   Estimated Creatinine Clearance: 39.9 mL/min (by C-G formula based on SCr of 0.86 mg/dL). No results for input(s): VANCOTROUGH, VANCOPEAK, VANCORANDOM, GENTTROUGH, GENTPEAK, GENTRANDOM, TOBRATROUGH, TOBRAPEAK, TOBRARND, AMIKACINPEAK, AMIKACINTROU, AMIKACIN in the last 72 hours.   Microbiology: Recent Results (from the past 720 hour(s))  Blood Culture (routine x 2)     Status: None (Preliminary result)   Collection Time: 12/23/17  3:31 PM  Result Value Ref Range Status   Specimen Description   Final    BLOOD Blood Culture results may not be optimal due to an inadequate volume of blood received in culture bottles   Special Requests BLOOD RIGHT FOREARM  Final   Culture   Final    NO GROWTH < 24 HOURS Performed at [facility], [address] [place], [place] [address]    Report Status PENDING  Incomplete  Blood Culture (routine x 2)     Status: None (Preliminary result)   Collection Time: 12/23/17  3:32 PM  Result Value Ref Range Status   Specimen Description BLOOD Blood Culture adequate volume  Final   Special Requests LEFT ANTECUBITAL  Final   Culture   Final    NO GROWTH < 24 HOURS Performed at [facility], [address], [place], [place] [address]    Report Status PENDING  Incomplete  Surgical pcr screen     Status: Abnormal   Collection Time: 12/23/17  6:36 PM  Result Value Ref Range Status   MRSA, PCR POSITIVE (A) NEGATIVE Final    Comment: RESULT CALLED TO, READ BACK BY AND VERIFIED WITH: THERESA COBLE ON 12/23/17 AT 2200 JAG    Staphylococcus aureus POSITIVE (A) NEGATIVE Final    Comment: (NOTE) The Xpert SA Assay (FDA approved for NASAL specimens in patients 51 years of age and older), is one component of a comprehensive surveillance program. It is not intended to diagnose infection nor to guide or monitor treatment. Performed at [facility], [address] [place], [place] [address]   Aerobic/Anaerobic Culture (surgical/deep wound)     Status: None (Preliminary result)   Collection Time: 12/23/17 10:47 PM  Result Value Ref Range Status   Specimen Description   Final    TOE LEFT GREAT TOE ULCER Performed at [facility], [address], [place], [place] [address]    Special Requests   Final    NONE Performed at [facility], [address] [place],  [address]    Gram Stain   Final    FEW WBC PRESENT, PREDOMINANTLY PMN ABUNDANT GRAM POSITIVE COCCI IN CHAINS RARE GRAM NEGATIVE RODS Performed at [facility], [place] [address] [place],  [address]    Culture PENDING  Incomplete   Report Status PENDING  Incomplete    Medical History:  Past Medical History:  Diagnosis Date  . Anemia    family unsure of this history  . Arthritis   . Chronic ulcer of calf (HCC)    Left  . Constipation   . Diabetes mellitus without complication ([place])   . Dyslipidemia   . GERD (gastroesophageal reflux disease)   . Hypertension   . Peripheral vascular disease ([place])     Medications:  Infusions:  . sodium chloride 100 mL/hr at 12/24/17 0654  . sodium chloride    . piperacillin-tazobactam (ZOSYN)  IV Stopped (12/24/17 1047)  . vancomycin Stopped (12/24/17 1145)   Assessment: 84  yof cc toe pain with PMH HTN, DM, PVD. Here from wound care after being referred for necrotic left great toe. Pharmacy consulted to dose Zosyn and vancomycin for sepsis/wound infection.  Goal of Therapy:  Vancomycin trough level 15-20 mcg/ml  Plan:  1. Continue Zosyn 3.375 gm IV Q8H EI 2. Continue vancomycin 750 mg iv q 24 hours. Trough scheduled with 4th dose. Goal trough 15-20 mcg/ml.   Ulice Dash D, Pharm.D., BCPS Clinical Pharmacist 12/24/2017,1:51 PM

## 2017-12-24 NOTE — Clinical Social Work Note (Signed)
Clinical Social Work Assessment  Patient Details  Name: [name] MRN: [id number] Date of Birth: 1926/03/29  Date of referral:  12/24/17               Reason for consult:  Facility Placement                Permission sought to share information with:    Permission granted to share information::     Name::        Agency::     Relationship::     Contact Information:     Housing/Transportation Living arrangements for the past 2 months:  Single Family Home Source of Information:  Patient, Other (Comment Required)(granddaughter [name]. ) Patient Interpreter Needed:  None Criminal Activity/Legal Involvement Pertinent to Current Situation/Hospitalization:  No - Comment as needed Significant Relationships:  Adult Children, Other Family Members Lives with:  Adult Children Do you feel safe going back to the place where you live?  Yes Need for family participation in patient care:  Yes (Comment)  Care giving concerns:  Patient lives in [place] with her son [name].    Social Worker assessment / plan:  Holiday representative (CSW) reviewed chart and noted that she post op day 1 from a toe amputation. CSW met with patient and her granddaughter/ HPOA [name] [phone] was at bedside. Patient was alert and oriented X4 and was laying in the bed. CSW introduced self and explained role of CSW department. Patient reported that she lives in [place] with her disabled son [name] and goes to the wound care center in [place]. CSW discussed D/C plan and patient reported that she is not interested in SNF and wants to go home. Per granddaughter [name] patient has 2 adult sons and 4 adult granddaughters that can provide support at home. Plan is for patient to D/C home with home health if needed. RN case manager aware of above. CSW will continue to follow and assist as needed.   Employment status:  Disabled (Comment on whether or not currently receiving Disability), Retired Designer, industrial/product PT Recommendations:  Not assessed at this time Information / Referral to community resources:     Patient/Family's Response to care: Patient prefers to go home.   Patient/Family's Understanding of and Emotional Response to Diagnosis, Current Treatment, and Prognosis:  Patient and her granddaughter were very pleasant and thanked CSW for visit.   Emotional Assessment Appearance:  Appears stated age Attitude/Demeanor/Rapport:    Affect (typically observed):  Accepting, Adaptable, Quiet, Pleasant Orientation:  Oriented to Self, Oriented to Place, Oriented to  Time, Oriented to Situation Alcohol / Substance use:  Not Applicable Psych involvement (Current and /or in the community):  No (Comment)  Discharge Needs  Concerns to be addressed:  Discharge Planning Concerns Readmission within the last 30 days:  No Current discharge risk:  Dependent with Mobility Barriers to Discharge:  Continued Medical Work up   [employer], [name], LCSW 12/24/2017, 12:24 PM

## 2017-12-24 NOTE — Progress Notes (Signed)
[name] ([id number]) Visit Report for 12/23/2017 Chief Complaint Document Details Patient Name: [name], [name] Date of Service: 12/23/2017 12:30 PM Medical Record Number: [id number] Patient Account Number: [id number] Date of Birth/Sex: 03-13-1926 (84 y.o. Female) Treating RN: Cornell Barman Primary Care Provider: Lucianne Lei Other Clinician: Referring Provider: Lucianne Lei Treating Provider/Extender: Cathie Olden in Treatment: 29 Information Obtained from: Patient Chief Complaint Patients presents for follow up for left great toe and left lower leg ulcer Electronic Signature(s) Signed: 12/23/2017 4:11:02 PM By: Lawanda Cousins Entered By: Lawanda Cousins on 12/23/2017 13:32:15 Bobby, Beckett P. ([id number]) -------------------------------------------------------------------------------- HPI Details Patient Name: Alexa Lowes P. Date of Service: 12/23/2017 12:30 PM Medical Record Number: [id number] Patient Account Number: [id number] Date of Birth/Sex: 1926/05/29 (84 y.o. Female) Treating RN: Cornell Barman Primary Care Provider: Lucianne Lei Other Clinician: Referring Provider: Lucianne Lei Treating Provider/Extender: Cathie Olden in Treatment: 29 History of Present Illness Location: left anterior shin Quality: Patient reports experiencing a dull pain to affected area(s). Severity: Patient states wound are getting worse. Duration: Patient has had the wound for < 3 weeks prior to presenting for treatment Timing: Pain in wound is Intermittent (comes and goes Context: The wound occurred when the patient blunt trauma against her left shin Modifying Factors: Other treatment(s) tried include:active Bactroban ointment locally Associated Signs and Symptoms: Patient reports having: occasional discharge from the wound HPI Description: 84 year old patient was seen in the ED about 10 days ago for a history of abrasion to the left lower extremity while she was boarding a  bus and scraped the anterior part of her left leg. She has been a diabetic for many years and has been taking treatment regularly. Her past medical history is also significant for anemia arthritis constipation and hypertension and is status post abdominal hysterectomy. She has never been a smoker. after her ER visit she was advised to apply Bactroban ointment to the wound twice a day and continue to monitor her blood glucose levels. her last hemoglobin A1c was done 3 years ago and was 6.6% 06/04/17 patient left anterior shin wound appears to be doing well although it is still somewhat dry despite the treatment with Medihoney. We have been using Kerlex following the Medihoney application and I believe this is just not retaining that much moisture. Nonetheless there is no evidence of infection. 06/11/17 on evaluation today patient appears to be doing a little bit worse in regard to her wound. The entirety of the wound is dry and unfortunately the Medihoney does not seem to be helping this. That is even with the dressing changes that I made last week to try to retain more moisture. She has not tried [employer] as of yet. She was also noncompressible when testing for ABIs. 07/01/2017 -- she had a arterial studies done and was seen by Dr. Lucky Cowboy. her noninvasive studies showed noncompressible vessels on the right but brisk waveforms and digital pressures of 71 on the right consistent with only mild arterial insufficienc. On the left her ABI was 0.47 and this may be falsely elevated due to calcification. No digital pressures were obtained on the left and this is consistent with severe arterial insufficiency. this critical limb threatening situation on the left, would make wound healing difficult and it represents a serious situation and he has recommended an angiography with possible revascularization. The patient desired to defer the decision to she discuss with the family members. 07/09/17 on evaluation today  patient appears to be doing fairly well in regard to her  left anterior lower extremity wound. She has been tolerating the dressing changes without complication we have been utilizing Santyl at this time. She tells me that she is having no significant pain compared to what she has had in the past. She has decided after discussing with family that she is going to go forward with the surgery with Dr. dew. This is to restore better blood flow to left lower extremity. With that it does appear that her lower extremity wound is making some progress albeit slow. No fevers, chills, nausea, or vomiting noted at this time. 07/16/17 on evaluation today patient's wound appears to be doing roughly about the same although some of the slough is clearing off. [name] she has her appointment with vascular next week on Thursday the 23rd. With that being said her wound does not appear to be hurting as badly as it has in the past which is good news. No fevers, chills, nausea, or vomiting noted at this time. 07/30/2017 -- she had surgery on 07/22/2017 --indications being nonhealing ulcer on the left leg with a noninvasive study showing marked reduction in ABI of less than 0.5 and no digital pressure on her left leg. she had a percutaneous transluminal angioplasty of the left common and external iliac arteries. She then had a stent placed to the left external iliac arteries and to the left common iliac artery. the left lower extremity showed occlusion of the common femoral artery and the origins of the Pressnell, [name] ([id number]) SFA and the profunda femoris artery. The flow distally was poor and there were multiple areas of high-grade stenosis or occlusion of the distal SFA and the popliteal artery with only a diseased peroneal artery as runoff distally. She has a postop appointment on September 23 08/20/2017 -- she went back to the vascular office for an appointment on 08/12/2017 and bilateral ABIs were done  which were notable for moderate right lower extremity arterial disease and unable to obtain left ankle brachial indices due to absent Doppler velocities in the posterior tibial artery and the anterior tibial artery. Monophasic waveforms to the left femoral and popliteal artery. this was worrisome for a failure of stents placed and lower extremity angiogram was recommended. as was done on 08/16/2017 and the stents placed in the left eye Lex system had some mild narrowing at the iliac bifurcation and the leading edge of the stent proximally was poor wall apposition with stenosis in the 50-60% range in the right common iliac artery. There was also occlusion of the common femoral artery and the origin of the SFA and profunda femoris artery. Flow distally was poor but they appeared to be multiple areas of high-grade stenosis or occlusion in the distal SFA and popliteal artery with only a diseased peroneal artery. in another stent was placed and deployed. The femoral occlusion would need to be treated surgically. 08/27/17 on evaluation today patient appears to be doing about the same in regard to her left for extremity wound. Unfortunately secondary to her vascular status this is not a very well healing wound at this time. She has been tolerating the dressing changes she has some discomfort but mainly with cleansing of the wound not at other times. No fevers, chills, nausea, or vomiting noted at this time. 09/17/2017 -- the patient was recently discharged from the hospital where she was admitted between 09/08/2017 and 09/10/2017, when she had a procedure on her left femoral artery with the endarterectomy and a patch. she will be going back for review  this coming week. 09/24/2017 -- was seen on 09/18/2017 in the ER, for bleeding from her wound which was debrided earlier during the day. At the time she was seen there was no active bleeding. A thrombin pad was placed over the area and she was asked to  change the outside dressing but come back to the wound center as planned. she was also seen at the [facility] office for a review after her surgery and bilateral lower extremity ABI when compared to the previous ones showed minimal improvement of arterial blood flow. She was set up for a CT angiogram of the abdomen and pelvis and lower extremities to see the degree of peripheral arterial disease 10/28/2017 -- had a x-ray of the left tibia and fibula done on 10/07/2017 -- showed no bony acute abnormality she was reviewed by Dr. Corene Cornea dew on 10/26/2017, and he reviewed her CT angiogram and this demonstrated a widely patent left femoral endarterectomy including the origins of the femoral artery. She also had moderate distal disease in the popliteal artery, distal SFA and tibial disease. Her peroneal artery is a dominant runoff distally.he felt she had adequate perfusion for healing the mid calf level and he is putting off all surgical intervention for now and is going to see her back in 2-3 months for noninvasive studies. She also has a new wound on the plantar aspect of the left big toe where she pulled a piece of skin and has caused a superficial ulceration 11/19/17 on evaluation today patient appears to be doing decently well at this point in time. It does appear that her Wound VAC has been approved as ordered by Dr. Con Memos however she has not received that as of yet. Nonetheless the wound bed in general appears to have minimal slough noted at this time in a fairly good amount of granulation although there is to be a noted in the center of the wound. No fevers, chills, nausea, or vomiting noted at this time. 12/02/17 on evaluation today patient appears to be doing about the same in regard to her left anterior shin wound. Unfortunately her left toe wound appears to still be doing about the same as far as the eschar covering. This has softened up a bit to the point that it is  now loose and could have selective debridement which would allow for the Santyl to work better. Fortunately she is having no pain. 12/10/17 on evaluation today patient appears to be doing fairly well in regard to her left anterior shin wound and that it at least appears to be stable. With that being said she does have really no significant healing at this point this wound seems to be mainly just maintaining. Subsequently patient also has the left toe wound which does not seem to be progressing. There is still nonviable tissue overlying the surface of the wound and this doesn't even seem to be loosening up very well at all. And patient has been using the Santyl. I do think this may need selective debridement today. Fortunately there does not appear to be any evidence of infection although I am concerned about the possibility that patient's blood flow is not likely sufficient to be able to tolerate appropriate wound healing. This was discussed with patient today as well although obviously I would leave the determination up to vascular surgery. She will be seeing Dr. dew on February 5 for repeat vascular studies and then in office Courtney, Enez P. ([id number]) evaluation. 12/17/17 on evaluation  today patient appears to be doing about the same in regard to her left shin and left great toe ulcers. She has been performing the dressing changes as previously recommended over the past week. With that being said unfortunately nothing seems to be changing much in regard to the dimensions of the wound and I do feel again that this may be related to vascular flow. Patient's daughter was present during the evaluation today and we did discuss this as well. I will be detailed in greater detail in the plan. 12/23/17 she is here in follow up evaluation of a left great toe and left anterior leg ulcer. There is noted ischemic changes (urple, dry gangrene, shriveling effect laterally) to the left great toe, although  not overtly cold. Contact was made to Dr Ozella Almond office regarding possibility of seeing her in the office today or tomorrow as this is an acute change; they recommended she go to the emergency department for evaluation by Dr Ozella Almond partner Dr Curley Spice. The ER was contacted, spoke with Tiffany, regarding this. Patient was accompanied by her son. The left anterior shin ulcer is improved in measurement, overtly exposed bone with area of discoloration/necrosis. will follow up after hospitalization/vascular intervention Electronic Signature(s) Signed: 12/23/2017 4:11:02 PM By: Lawanda Cousins Entered By: Lawanda Cousins on 12/23/2017 13:37:45 Ogando, [name] ([id number]) -------------------------------------------------------------------------------- Physician Orders Details Patient Name: Alexa Lowes P. Date of Service: 12/23/2017 12:30 PM Medical Record Number: [id number] Patient Account Number: [id number] Date of Birth/Sex: Apr 29, 1926 (84 y.o. Female) Treating RN: Cornell Barman Primary Care Provider: Lucianne Lei Other Clinician: Referring Provider: Lucianne Lei Treating Provider/Extender: Cathie Olden in Treatment: 61 Verbal / Phone Orders: No Diagnosis Coding Wound Cleansing Wound #1 Left,Anterior Lower Leg o Clean wound with Normal Saline. Wound #2 Left,Plantar Toe Great o Clean wound with Normal Saline. Primary Wound Dressing Wound #1 Left,Anterior Lower Leg o Hydrogel Secondary Dressing Wound #1 Left,Anterior Lower Leg o Conform/Kerlix o Non-adherent pad Wound #2 Left,Plantar Toe Great o Conform/Kerlix Dressing Change Frequency Wound #1 Left,Anterior Lower Leg o Change Dressing Monday, Wednesday, Friday Wound #2 Left,Plantar Toe Great o Change Dressing Monday, Wednesday, Friday Follow-up Appointments Wound #1 Left,Anterior Lower Leg o Return Appointment in 1 week. Wound #2 Left,Plantar Toe Great o Return Appointment in 1 week. Notes Patient sent to  [facility] for evaluation of left great toe. Electronic Signature(s) Signed: 12/23/2017 4:11:02 PM By: Lawanda Cousins Entered By: Lawanda Cousins on 12/23/2017 13:38:24 Mccrystal, [name] ([id number]) -------------------------------------------------------------------------------- Problem List Details Patient Name: Alexa Lowes P. Date of Service: 12/23/2017 12:30 PM Medical Record Number: [id number] Patient Account Number: [id number] Date of Birth/Sex: 1926-03-30 (84 y.o. 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 ([id number]) -------------------------------------------------------------------------------- Progress Note Details Patient Name: Alexa Lowes P. Date of Service: 12/23/2017 12:30 PM Medical Record Number:  [id number] Patient Account Number: [id number] Date of Birth/Sex: August 12, 1926 (84 y.o. Female) Treating RN: Cornell Barman Primary Care Provider: Lucianne Lei Other Clinician: Referring Provider: Lucianne Lei Treating Provider/Extender: Cathie Olden in Treatment: 29 Subjective Chief Complaint Information obtained from  Patient Patients presents for follow up for left great toe and left lower leg ulcer History of Present Illness (HPI) The following HPI elements were documented for the patient's wound: Location: left anterior shin Quality: Patient reports experiencing a dull pain to affected area(s). Severity: Patient states wound are getting worse. Duration: Patient has had the wound for < 3 weeks prior to presenting for treatment Timing: Pain in wound is Intermittent (comes and goes Context: The wound occurred when the patient blunt trauma against her left shin Modifying Factors: Other treatment(s) tried include:active Bactroban ointment locally Associated Signs and Symptoms: Patient reports having: occasional discharge from the wound 84 year old patient was seen in the ED about 10 days ago for a history of abrasion to the left lower extremity while she was boarding a bus and scraped the anterior part of her left leg. She has been a diabetic for many years and has been taking treatment regularly. Her past medical history is also significant for anemia arthritis constipation and hypertension and is status post abdominal hysterectomy. She has never been a smoker. after her ER visit she was advised to apply Bactroban ointment to the wound twice a day and continue to monitor her blood glucose levels. her last hemoglobin A1c was done 3 years ago and was 6.6% 06/04/17 patient left anterior shin wound appears to be doing well although it is still somewhat dry despite the treatment with Medihoney. We have been using Kerlex following the Medihoney application and I believe this is just not retaining that much moisture. Nonetheless there is no evidence of infection. 06/11/17 on evaluation today patient appears to be doing a little bit worse in regard to her wound. The entirety of the wound is dry and unfortunately the Medihoney does not seem to be helping this. That is even with the dressing changes that I made last  week to try to retain more moisture. She has not tried [employer] as of yet. She was also noncompressible when testing for ABIs. 07/01/2017 -- she had a arterial studies done and was seen by Dr. Lucky Cowboy. her noninvasive studies showed noncompressible vessels on the right but brisk waveforms and digital pressures of 71 on the right consistent with only mild arterial insufficienc. On the left her ABI was 0.47 and this may be falsely elevated due to calcification. No digital pressures were obtained on the left and this is consistent with severe arterial insufficiency. this critical limb threatening situation on the left, would make wound healing difficult and it represents a serious situation and he has recommended an angiography with possible revascularization. The patient desired to defer the decision to she discuss with the family members. 07/09/17 on evaluation today patient appears to be doing fairly well in regard to her left anterior lower extremity wound. She has been tolerating the dressing changes without complication we have been utilizing Santyl at this time. She tells me that she is having no significant pain compared to what she has had in the past. She has decided after discussing with family that she is going to go forward with the surgery with Dr. dew. This is to restore better blood flow to left lower extremity. With that it does appear that her lower extremity wound is making some progress albeit slow.  No fevers, chills, nausea, or vomiting noted at this time. 07/16/17 on evaluation today patient's wound appears to be doing roughly about the same although some of the slough is Kamen, Ruhama P. ([id number]) clearing off. [name] she has her appointment with vascular next week on Thursday the 23rd. With that being said her wound does not appear to be hurting as badly as it has in the past which is good news. No fevers, chills, nausea, or vomiting noted at this time. 07/30/2017 -- she had  surgery on 07/22/2017 --indications being nonhealing ulcer on the left leg with a noninvasive study showing marked reduction in ABI of less than 0.5 and no digital pressure on her left leg. she had a percutaneous transluminal angioplasty of the left common and external iliac arteries. She then had a stent placed to the left external iliac arteries and to the left common iliac artery. 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 The flow distally was poor and there were multiple areas of high-grade stenosis or occlusion of the distal SFA and the popliteal artery with only a diseased peroneal artery as runoff distally. She has a postop appointment on September 23 08/20/2017 -- she went back to the vascular office for an appointment on 08/12/2017 and bilateral ABIs were done which were notable for moderate right lower extremity arterial disease and unable to obtain left ankle brachial indices due to absent Doppler velocities in the posterior tibial artery and the anterior tibial artery. Monophasic waveforms to the left femoral and popliteal artery. this was worrisome for a failure of stents placed and lower extremity angiogram was recommended. as was done on 08/16/2017 and the stents placed in the left eye Lex system had some mild narrowing at the iliac bifurcation and the leading edge of the stent proximally was poor wall apposition with stenosis in the 50-60% range in the right common iliac artery. There was also occlusion of the common femoral artery and the origin of the SFA and profunda femoris artery. Flow distally was poor but they appeared to be multiple areas of high-grade stenosis or occlusion in the distal SFA and popliteal artery with only a diseased peroneal artery. in another stent was placed and deployed. The femoral occlusion would need to be treated surgically. 08/27/17 on evaluation today patient  appears to be doing about the same in regard to her left for extremity wound. Unfortunately secondary to her vascular status this is not a very well healing wound at this time. She has been tolerating the dressing changes she has some discomfort but mainly with cleansing of the wound not at other times. No fevers, chills, nausea, or vomiting noted at this time. 09/17/2017 -- the patient was recently discharged from the hospital where she was admitted between 09/08/2017 and 09/10/2017, when she had a procedure on her left femoral artery with the endarterectomy and a patch. she will be going back for review this coming week. 09/24/2017 -- was seen on 09/18/2017 in the ER, for bleeding from her wound which was debrided earlier during the day. At the time she was seen there was no active bleeding. A thrombin pad was placed over the area and she was asked to change the outside dressing but come back to the wound center as planned. she was also seen at the [facility] office for a review after her surgery and bilateral lower extremity ABI when compared to  the previous ones showed minimal improvement of arterial blood flow. She was set up for a CT angiogram of the abdomen and pelvis and lower extremities to see the degree of peripheral arterial disease 10/28/2017 -- had a x-ray of the left tibia and fibula done on 10/07/2017 -- showed no bony acute abnormality she was reviewed by Dr. Corene Cornea dew on 10/26/2017, and he reviewed her CT angiogram and this demonstrated a widely patent left femoral endarterectomy including the origins of the femoral artery. She also had moderate distal disease in the popliteal artery, distal SFA and tibial disease. Her peroneal artery is a dominant runoff distally.he felt she had adequate perfusion for healing the mid calf level and he is putting off all surgical intervention for now and is going to see her back in 2-3 months for noninvasive studies. She  also has a new wound on the plantar aspect of the left big toe where she pulled a piece of skin and has caused a superficial ulceration 11/19/17 on evaluation today patient appears to be doing decently well at this point in time. It does appear that her Wound VAC has been approved as ordered by Dr. Con Memos however she has not received that as of yet. Nonetheless the wound bed in general appears to have minimal slough noted at this time in a fairly good amount of granulation although there is to be a noted in the center of the wound. No fevers, chills, nausea, or vomiting noted at this time. 12/02/17 on evaluation today patient appears to be doing about the same in regard to her left anterior shin wound. Unfortunately her left toe wound appears to still be doing about the same as far as the eschar covering. This has softened up a bit to the point that it is now loose and could have selective debridement which would allow for the Santyl to work better. Fortunately she is having no pain. NOU, CHARD ([id number]) 12/10/17 on evaluation today patient appears to be doing fairly well in regard to her left anterior shin wound and that it at least appears to be stable. With that being said she does have really no significant healing at this point this wound seems to be mainly just maintaining. Subsequently patient also has the left toe wound which does not seem to be progressing. There is still nonviable tissue overlying the surface of the wound and this doesn't even seem to be loosening up very well at all. And patient has been using the Santyl. I do think this may need selective debridement today. Fortunately there does not appear to be any evidence of infection although I am concerned about the possibility that patient's blood flow is not likely sufficient to be able to tolerate appropriate wound healing. This was discussed with patient today as well although obviously I would leave the determination  up to vascular surgery. She will be seeing Dr. dew on February 5 for repeat vascular studies and then in office evaluation. 12/17/17 on evaluation today patient appears to be doing about the same in regard to her left shin and left great toe ulcers. She has been performing the dressing changes as previously recommended over the past week. With that being said unfortunately nothing seems to be changing much in regard to the dimensions of the wound and I do feel again that this may be related to vascular flow. Patient's daughter was present during the evaluation today and we did discuss this as well. I will be detailed in greater  detail in the plan. 12/23/17 she is here in follow up evaluation of a left great toe and left anterior leg ulcer. There is noted ischemic changes (urple, dry gangrene, shriveling effect laterally) to the left great toe, although not overtly cold. Contact was made to Dr Ozella Almond office regarding possibility of seeing her in the office today or tomorrow as this is an acute change; they recommended she go to the emergency department for evaluation by Dr Ozella Almond partner Dr Curley Spice. The ER was contacted, spoke with Tiffany, regarding this. Patient was accompanied by her son. The left anterior shin ulcer is improved in measurement, overtly exposed bone with area of discoloration/necrosis. will follow up after hospitalization/vascular intervention Patient History Information obtained from Patient. Social History Never smoker, Marital Status - Separated, Alcohol Use - Never, Drug Use - No History, Caffeine Use - Rarely. Objective Constitutional Vitals Time Taken: 12:43 PM, Height: 65 in, Weight: 131 lbs, BMI: 21.8, Temperature: 98.3 F, Pulse: 99 bpm, Respiratory Rate: 16 breaths/min, Blood Pressure: 131/58 mmHg. Integumentary (Hair, Skin) Wound #1 status is Open. Original cause of wound was Trauma. The wound is located on the Left,Anterior Lower Leg. The wound measures 7cm length x  3.2cm width x 0.2cm depth; 17.593cm^2 area and 3.519cm^3 volume. There is tendon, Fat Layer (Subcutaneous Tissue) Exposed, and fascia exposed. There is no tunneling or undermining noted. There is a large amount of serous drainage noted. The wound margin is flat and intact. There is medium (34-66%) red granulation within the wound bed. There is a medium (34-66%) amount of necrotic tissue within the wound bed including Adherent Slough. The periwound skin appearance exhibited: Scarring, Maceration, Hemosiderin Staining. The periwound skin appearance did not exhibit: Callus, Crepitus, Excoriation, Induration, Rash, Dry/Scaly, Atrophie Blanche, Cyanosis, Ecchymosis, Mottled, Pallor, Rubor, Erythema. Periwound temperature was noted as No Abnormality. The periwound has tenderness on palpation. Wound #2 status is Open. Original cause of wound was Trauma. The wound is located on the [employer]. The wound measures 1cm length x 1cm width x 0.2cm depth; 0.785cm^2 area and 0.157cm^3 volume. There is Fat Layer (Subcutaneous Tissue) Exposed exposed. There is no tunneling or undermining noted. There is a none present amount of drainage noted. The wound margin is flat and intact. There is no granulation within the wound bed. There is a large (67-100%) Hohler, Jermika P. ([id number]) amount of necrotic tissue within the wound bed including Eschar. The periwound skin appearance exhibited: Callus. The periwound skin appearance did not exhibit: Crepitus, Excoriation, Induration, Rash, Scarring, Dry/Scaly, Maceration, Atrophie Blanche, Cyanosis, Ecchymosis, Hemosiderin Staining, Mottled, Pallor, Rubor, Erythema. Periwound temperature was noted as No Abnormality. 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 Wound #2 Left,Plantar Toe Great: Clean wound with Normal Saline. Primary Wound Dressing: Wound #1 Left,Anterior Lower Leg: Hydrogel Secondary Dressing: Wound #1 Left,Anterior Lower Leg: Conform/Kerlix Non-adherent pad Wound #2 Left,Plantar Toe Great: Conform/Kerlix Dressing Change Frequency: Wound #1 Left,Anterior Lower Leg: Change Dressing Monday, Wednesday, Friday Wound #2 Left,Plantar Toe Great: Change Dressing Monday, Wednesday, Friday Follow-up Appointments: Wound #1 Left,Anterior  Lower Leg: Return Appointment in 1 week. Wound #2 Left,Plantar Toe Great: Return Appointment in 1 week. General Notes: Patient sent to [facility] for evaluation of left great toe. NINETTA, ADELSTEIN ([id number]) 1. to ER for evaluation by vascular medicine (per vascular medicine office) 2. continue with hydrogel to left anterior ulcer; no indication for NPWT 3. follow up after hospitalization/vascular evaluation and anticipated intervention Electronic Signature(s) Signed: 12/23/2017 4:11:02 PM By: Lawanda Cousins Entered By: Lawanda Cousins on 12/23/2017 13:43:13 Reasoner, [name] ([id number]) -------------------------------------------------------------------------------- ROS/PFSH Details Patient Name: Alexa Lowes P. Date of Service: 12/23/2017 12:30 PM Medical Record Number: [id number] Patient Account Number: [id number] Date of Birth/Sex: [birth year] (84 y.o. Female) Treating RN: Cornell Barman Primary Care Provider: Lucianne Lei Other Clinician: Referring Provider: Lucianne Lei Treating Provider/Extender: Cathie Olden in Treatment: 29 Information Obtained From Patient Wound History Do you currently  have one or more open woundso Yes How many open wounds do you currently haveo 1 Approximately how long have you had your woundso 3 weeks How have you been treating your wound(s) until nowo open to air Has your wound(s) ever healed and then re-openedo No Have you had any lab work done in the past montho No Have you tested positive for an antibiotic resistant organism (MRSA, VRE)o No Have you tested positive for osteomyelitis (bone infection)o No Have you had any tests for circulation on your legso No Hematologic/Lymphatic Medical History: Positive for: Anemia Cardiovascular Medical History: Positive for: Hypertension Endocrine Medical History: Positive for: Type II Diabetes Treated with: Oral agents Musculoskeletal Medical History: Positive for: Osteoarthritis Oncologic Medical History: Negative for: Received Chemotherapy; Received Radiation Immunizations Pneumococcal Vaccine: Received Pneumococcal Vaccination: Yes Immunization Notes: up to date Implantable Devices Family and Social History SHLEY, DOLBY ([id number]) Never smoker; Marital Status - Separated; Alcohol Use: Never; Drug Use: No History; Caffeine Use: Rarely; Financial Concerns: No; Food, Clothing or Shelter Needs: No; Support System Lacking: No; Transportation Concerns: No; Advanced Directives: No; Patient does not want information on Advanced Directives Physician Affirmation I have reviewed and agree with the above information. Electronic Signature(s) Signed: 12/23/2017 4:11:02 PM By: Lawanda Cousins Signed: 12/23/2017 5:01:37 PM By: Gretta Cool, BSN, RN, CWS, Kim RN, BSN Entered By: Lawanda Cousins on 12/23/2017 13:38:06 Tencza, [name] ([id number]) -------------------------------------------------------------------------------- SuperBill Details Patient Name: Alexa Lowes P. Date of Service: 12/23/2017 Medical Record Number: [id number] Patient Account Number: [id number] Date of Birth/Sex: 07-03-1926 (84  y.o. 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 I70.245 Atherosclerosis of native arteries of left leg with ulceration of other part of foot Facility Procedures CPT4 Code: [id number] Description: 99214 - WOUND CARE VISIT-LEV 4 EST PT Modifier: Quantity: 1 Physician Procedures CPT4 Code Description: [id number] 99213 - WC PHYS LEVEL 3 - EST PT ICD-10 Diagnosis Description I70.245 Atherosclerosis of native arteries of left leg with ulceration of I70.242 Atherosclerosis of native arteries of left leg with ulceration of E11.622 Type  2 diabetes mellitus with other skin ulcer L97.824 Non-pressure chronic ulcer of other part of left lower leg with n Modifier: other part of foo calf ecrosis of bone Quantity: 1 t  Electronic Signature(s) Signed: 12/23/2017 4:11:02 PM By: Lawanda Cousins Entered By: Lawanda Cousins on 12/23/2017 13:41:46

## 2017-12-24 NOTE — Progress Notes (Signed)
[name], [name] ([id number]) Visit Report for 12/23/2017 Arrival Information Details Patient Name: [name], [name] Date of Service: 12/23/2017 12:30 PM Medical Record Number: [id number] Patient Account Number: [id number] Date of Birth/Sex: 1925/12/15 (84 y.o. Female) Treating RN: Cornell Barman Primary Care Edye Hainline: Lucianne Lei Other Clinician: Referring Miaa Latterell: Lucianne Lei Treating Sugey Trevathan/Extender: Cathie Olden in Treatment: 29 Visit Information History Since Last Visit Added or deleted any medications: No Patient Arrived: Wheel Chair Any new allergies or adverse reactions: No Arrival Time: 12:42 Had a fall or experienced change in No activities of daily living that may affect Accompanied By: son risk of falls: Transfer Assistance: Manual Signs or symptoms of abuse/neglect since last visito No Patient Identification Verified: Yes Hospitalized since last visit: No Secondary Verification Process Completed: Yes Has Dressing in Place as Prescribed: Yes Patient Requires Transmission-Based No Pain Present Now: No Precautions: Patient Has Alerts: Yes Electronic Signature(s) Signed: 12/23/2017 5:01:37 PM By: Gretta Cool, BSN, RN, CWS, Kim RN, BSN Entered By: Gretta Cool, BSN, RN, CWS, Kim on 12/23/2017 12:42:51 [name], [name] ([id number]) -------------------------------------------------------------------------------- Clinic Level of Care Assessment Details Patient Name: Alexa Lowes P. Date of Service: 12/23/2017 12:30 PM Medical Record Number: [id number] Patient Account Number: [id number] Date of Birth/Sex: 07/12/1926 (84 y.o. Female) Treating RN: Cornell Barman Primary Care Jade Burright: Lucianne Lei Other Clinician: Referring Raeden Schippers: Lucianne Lei Treating Toretto Tingler/Extender: Cathie Olden in Treatment: 29 Clinic Level of Care Assessment Items TOOL 4 Quantity Score []  - Use when only an EandM is performed on FOLLOW-UP visit 0 ASSESSMENTS - Nursing Assessment /  Reassessment []  - Reassessment of Co-morbidities (includes updates in patient status) 0 X- 1 5 Reassessment of Adherence to Treatment Plan ASSESSMENTS - Wound and Skin Assessment / Reassessment []  - Simple Wound Assessment / Reassessment - one wound 0 X- 2 5 Complex Wound Assessment / Reassessment - multiple wounds []  - 0 Dermatologic / Skin Assessment (not related to wound area) ASSESSMENTS - Focused Assessment []  - Circumferential Edema Measurements - multi extremities 0 []  - 0 Nutritional Assessment / Counseling / Intervention []  - 0 Lower Extremity Assessment (monofilament, tuning fork, pulses) []  - 0 Peripheral Arterial Disease Assessment (using hand held doppler) ASSESSMENTS - Ostomy and/or Continence Assessment and Care []  - Incontinence Assessment and Management 0 []  - 0 Ostomy Care Assessment and Management (repouching, etc.) PROCESS - Coordination of Care X - Simple Patient / Family Education for ongoing care 1 15 X- 1 20 Complex (extensive) Patient / Family Education for ongoing care []  - 0 Staff obtains Programmer, systems, Records, Test Results / Process Orders []  - 0 Staff telephones HHA, Nursing Homes / Clarify orders / etc []  - 0 Routine Transfer to another Facility (non-emergent condition) []  - 0 Routine Hospital Admission (non-emergent condition) []  - 0 New Admissions / Biomedical engineer / Ordering NPWT, Apligraf, etc. X- 1 20 Emergency Hospital Admission (emergent condition) X- 1 10 Simple Discharge Coordination Touhey, Narelle P. ([id number]) []  - 0 Complex (extensive) Discharge Coordination PROCESS - Special Needs []  - Pediatric / Minor Patient Management 0 []  - 0 Isolation Patient Management []  - 0 Hearing / Language / Visual special needs []  - 0 Assessment of Community assistance (transportation, D/C planning, etc.) []  - 0 Additional assistance / Altered mentation []  - 0 Support Surface(s) Assessment (bed, cushion, seat, etc.) INTERVENTIONS -  Wound Cleansing / Measurement []  - Simple Wound Cleansing - one wound 0 X- 1 5 Complex Wound Cleansing - multiple wounds X- 1 5 Wound Imaging (photographs - any number of wounds) []  -  0 Wound Tracing (instead of photographs) []  - 0 Simple Wound Measurement - one wound X- 1 5 Complex Wound Measurement - multiple wounds INTERVENTIONS - Wound Dressings X - Small Wound Dressing one or multiple wounds 1 10 X- 1 15 Medium Wound Dressing one or multiple wounds []  - 0 Large Wound Dressing one or multiple wounds []  - 0 Application of Medications - topical []  - 0 Application of Medications - injection INTERVENTIONS - Miscellaneous []  - External ear exam 0 []  - 0 Specimen Collection (cultures, biopsies, blood, body fluids, etc.) []  - 0 Specimen(s) / Culture(s) sent or taken to Lab for analysis []  - 0 Patient Transfer (multiple staff / Civil Service fast streamer / Similar devices) []  - 0 Simple Staple / Suture removal (25 or less) []  - 0 Complex Staple / Suture removal (26 or more) []  - 0 Hypo / Hyperglycemic Management (close monitor of Blood Glucose) []  - 0 Ankle / Brachial Index (ABI) - do not check if billed separately X- 1 5 Vital Signs [name], [name] ([id number]) Has the patient been seen at the hospital within the last three years: Yes Total Score: 125 Level Of Care: New/Established - Level 4 Electronic Signature(s) Signed: 12/23/2017 5:01:37 PM By: Gretta Cool, BSN, RN, CWS, Kim RN, BSN Entered By: Gretta Cool, BSN, RN, CWS, Kim on 12/23/2017 13:29:30 [name], [name] ([id number]) -------------------------------------------------------------------------------- Encounter Discharge Information Details Patient Name: Alexa Lowes P. Date of Service: 12/23/2017 12:30 PM Medical Record Number: [id number] Patient Account Number: [id number] Date of Birth/Sex: 09-18-1926 (84 y.o. Female) Treating RN: Cornell Barman Primary Care Romi Rathel: Lucianne Lei Other Clinician: Referring Jetson Pickrel: Lucianne Lei Treating Bridger Pizzi/Extender: Cathie Olden in Treatment: 29 Encounter Discharge Information Items Discharge Condition: Stable Ambulatory Status: Wheelchair Emergency Discharge Destination: Room Transportation: Private Auto Accompanied By: son Schedule Follow-up Appointment: Yes Medication Reconciliation completed and Yes provided to Patient/Care Jadin Kagel: Patient Clinical Summary of Care: Declined Electronic Signature(s) Signed: 12/23/2017 5:01:37 PM By: Gretta Cool, BSN, RN, CWS, Kim RN, BSN Entered By: Gretta Cool, BSN, RN, CWS, Kim on 12/23/2017 13:30:48 [name], [name] ([id number]) -------------------------------------------------------------------------------- Lower Extremity Assessment Details Patient Name: Alexa Lowes P. Date of Service: 12/23/2017 12:30 PM Medical Record Number: [id number] Patient Account Number: [id number] Date of Birth/Sex: 09-03-1926 (84 y.o. Female) Treating RN: Cornell Barman Primary Care Philemon Riedesel: Lucianne Lei Other Clinician: Referring Zaidyn Claire: Lucianne Lei Treating Jaeden Messer/Extender: Cathie Olden in Treatment: 29 Vascular Assessment Pulses: Dorsalis Pedis Palpable: [Left:No] Doppler Audible: [Left:Yes] Posterior Tibial Palpable: [Left:No] Doppler Audible: [Left:Yes] Extremity colors, hair growth, and conditions: Extremity Color: [Left:Dusky] Hair Growth on Extremity: [Left:No] Temperature of Extremity: [Left:Warm] Capillary Refill: [Left:> 3 seconds] Toe Nail Assessment Left: Right: Thick: Yes Discolored: Yes Deformed: Yes Improper Length and Hygiene: No Electronic Signature(s) Signed: 12/23/2017 5:01:37 PM By: Gretta Cool, BSN, RN, CWS, Kim RN, BSN Entered By: Gretta Cool, BSN, RN, CWS, Kim on 12/23/2017 12:54:22 [name], [name] ([id number]) -------------------------------------------------------------------------------- Multi Wound Chart Details Patient Name: Alexa Lowes P. Date of Service: 12/23/2017 12:30 PM Medical  Record Number: [id number] Patient Account Number: [id number] Date of Birth/Sex: 1926/07/28 (84 y.o. 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 Secondary  Etiology: Trauma, Other N/A N/A Comorbid History: Anemia, Hypertension, Type II Anemia, Hypertension, Type II N/A Diabetes, Osteoarthritis Diabetes, Osteoarthritis Date Acquired: 05/02/2017 10/28/2017 N/A Weeks of Treatment: 29 8 N/A Wound Status: Open Open N/A Measurements L x W x D 7x3.2x0.2 1x1x0.2 N/A (cm) Area (cm) : 17.593 0.785 N/A Volume (cm) : 3.519 0.157 N/A % Reduction in Area: -266.00% -256.80% N/A % Reduction in Volume: -266.20% -613.60% N/A Classification: Grade 2 Full Thickness Without N/A Exposed Support Structures Exudate Amount: Large None Present N/A Exudate Type: Serous N/A N/A Exudate Color: amber N/A N/A Wound Margin: Flat and Intact Flat and Intact N/A Granulation Amount: Medium (34-66%) None Present (0%) N/A Granulation Quality: Red N/A N/A [name], [name] ([id number]) Necrotic Amount: Medium (34-66%) Large (67-100%) N/A Necrotic Tissue: Adherent Slough Eschar N/A Exposed Structures: Fascia: Yes Fat Layer (Subcutaneous N/A Fat Layer (Subcutaneous Tissue) Exposed: Yes Tissue) Exposed: Yes Fascia: No Tendon: Yes Tendon: No Muscle: No Muscle: No Joint: No Joint: No Bone: No Bone: No Epithelialization: Small (1-33%) None N/A Periwound Skin Texture: Scarring:  Yes Callus: Yes N/A Excoriation: No Excoriation: No Induration: No Induration: No Callus: No Crepitus: No Crepitus: No Rash: No Rash: No Scarring: No Periwound Skin Moisture: Maceration: Yes Maceration: No N/A Dry/Scaly: No Dry/Scaly: No Periwound Skin Color: Hemosiderin Staining: Yes Atrophie Blanche: No N/A Atrophie Blanche: No Cyanosis: No Cyanosis: No Ecchymosis: No Ecchymosis: No Erythema: No Erythema: No Hemosiderin Staining: No Mottled: No Mottled: No Pallor: No Pallor: No Rubor: No Rubor: No Temperature: No Abnormality No Abnormality N/A Tenderness on Palpation: Yes No N/A Wound Preparation: Ulcer Cleansing: Ulcer Cleansing: N/A Rinsed/Irrigated with Saline Rinsed/Irrigated with Saline Topical Anesthetic Applied: Topical Anesthetic Applied: Other: lidocaine 4% Other: lidocaine 4% Treatment Notes Wound #1 (Left, Anterior Lower Leg) 1. Cleansed with: Clean wound with Normal Saline Notes hydrogel and nonstick pad applied to leg, conform only to toe Wound #2 (Left, Plantar Toe Great) 1. Cleansed with: Clean wound with Normal Saline Notes hydrogel and nonstick pad applied to leg, conform only to toe Electronic Signature(s) Signed: 12/23/2017 4:11:02 PM By: Lawanda Cousins Entered By: Lawanda Cousins on 12/23/2017 13:42:45 [name], [name] ([id number]) -------------------------------------------------------------------------------- [place] Details Patient Name: Alexa Lowes P. Date of Service: 12/23/2017 12:30 PM Medical Record Number: [id number] Patient Account Number: [id number] Date of Birth/Sex: Apr 09, 1926 (84 y.o. Female) Treating RN: Cornell Barman Primary Care Ashelynn Marks: Lucianne Lei Other Clinician: Referring Payzlee Ryder: Lucianne Lei Treating Mathew Postiglione/Extender: Cathie Olden in Treatment: 29 Active Inactive ` Abuse / Safety / Falls / Self Care Management Nursing Diagnoses: Potential for falls Goals: Patient will  remain injury free related to falls Date Initiated: 05/28/2017 Target Resolution Date: 07/23/2017 Goal Status: Active Interventions: Assess fall risk on admission and as needed Notes: ` Orientation to the Wound Care Program Nursing Diagnoses: Knowledge deficit related to the wound healing center program Goals: Patient/caregiver will verbalize understanding of the [facility] Program Date Initiated: 05/28/2017 Target Resolution Date: 07/23/2017 Goal Status: Active Interventions: Provide education on orientation to the wound center Notes: ` Wound/Skin Impairment Nursing Diagnoses: Impaired tissue integrity Goals: Ulcer/skin breakdown will have a volume reduction of 30% by week 4 Date Initiated: 05/28/2017 Target Resolution Date: 07/23/2017 Goal Status: Active Ulcer/skin breakdown will have a volume reduction of 50% by week 8 Date Initiated: 05/28/2017 Target Resolution Date: 07/23/2017 [name], [name] ([id number]) Goal Status: Active Ulcer/skin breakdown will have a volume reduction of 80% by week 12 Date Initiated: 05/28/2017 Target Resolution Date: 07/23/2017 Goal Status: Active Ulcer/skin breakdown  will heal within 14 weeks Date Initiated: 05/28/2017 Target Resolution Date: 07/23/2017 Goal Status: Active Interventions: Assess patient/caregiver ability to obtain necessary supplies Assess patient/caregiver ability to perform ulcer/skin care regimen upon admission and as needed Assess ulceration(s) every visit Notes: Electronic Signature(s) Signed: 12/23/2017 5:01:37 PM By: Gretta Cool, BSN, RN, CWS, Kim RN, BSN Entered By: Gretta Cool, BSN, RN, CWS, Kim on 12/23/2017 12:56:34 [name], [name] ([id number]) -------------------------------------------------------------------------------- Pain Assessment Details Patient Name: Alexa Lowes P. Date of Service: 12/23/2017 12:30 PM Medical Record Number: [id number] Patient Account Number: [id number] Date of Birth/Sex: 1926/02/25  (84 y.o. Female) Treating RN: Cornell Barman Primary Care Delwin Raczkowski: Lucianne Lei Other Clinician: Referring Karlye Ihrig: Lucianne Lei Treating Catie Chiao/Extender: Cathie Olden in Treatment: 29 Active Problems Location of Pain Severity and Description of Pain Patient Has Paino Yes Site Locations Pain Location: Generalized Pain, Pain in Ulcers With Dressing Change: Yes Rate the pain. Current Pain Level: 7 Worst Pain Level: 7 Least Pain Level: 2 Character of Pain Describe the Pain: Aching, Sharp, Shooting, Tender Pain Management and Medication Current Pain Management: Goals for Pain Management Topical or injectable lidocaine is offered to patient for acute pain when surgical debridement is performed. If needed, Patient is instructed to use over the counter pain medication for the following 24-48 hours after debridement. Wound care MDs do not prescribed pain medications. Patient has chronic pain or uncontrolled pain. Patient has been instructed to make an appointment with their Primary Care Physician for pain management. Electronic Signature(s) Signed: 12/23/2017 5:01:37 PM By: Gretta Cool, BSN, RN, CWS, Kim RN, BSN Entered By: Gretta Cool, BSN, RN, CWS, Kim on 12/23/2017 12:43:30 [name], [name] ([id number]) -------------------------------------------------------------------------------- Patient/Caregiver Education Details Patient Name: Alexa Lowes P. Date of Service: 12/23/2017 12:30 PM Medical Record Number: [id number] Patient Account Number: [id number] Date of Birth/Gender: 1926-04-21 (84 y.o. Female) Treating RN: Cornell Barman Primary Care Physician: Lucianne Lei Other Clinician: Referring Physician: Lucianne Lei Treating Physician/Extender: Cathie Olden in Treatment: 25 Education Assessment Education Provided To: Patient Education Topics Provided Wound/Skin Impairment: Handouts: Caring for Your Ulcer, Other: Go to ED for evaluation of left great toe Methods:  Demonstration, Explain/Verbal Responses: State content correctly Electronic Signature(s) Signed: 12/23/2017 5:01:37 PM By: Gretta Cool, BSN, RN, CWS, Kim RN, BSN Entered By: Gretta Cool, BSN, RN, CWS, Kim on 12/23/2017 13:31:18 Amadi, [name] ([id number]) -------------------------------------------------------------------------------- Wound Assessment Details Patient Name: Alexa Lowes P. Date of Service: 12/23/2017 12:30 PM Medical Record Number: [id number] Patient Account Number: [id number] Date of Birth/Sex: 26-Apr-1926 (84 y.o. Female) Treating RN: Cornell Barman Primary Care Tove Wideman: Lucianne Lei Other Clinician: Referring Jayd Cadieux: Lucianne Lei Treating Eastyn Dattilo/Extender: Cathie Olden in Treatment: 29 Wound Status Wound Number: 1 Primary Etiology: Diabetic Wound/Ulcer of the Lower Extremity Wound Location: Left Lower Leg - Anterior Secondary Trauma, Other Wounding Event: Trauma Etiology: Date Acquired: 05/02/2017 Wound Status: Open Weeks Of Treatment: 29 Comorbid Anemia, Hypertension, Type II Diabetes, Clustered Wound: No History: Osteoarthritis Photos Wound Measurements Length: (cm) 7 Width: (cm) 3.2 Depth: (cm) 0.2 Area: (cm) 17.593 Volume: (cm) 3.519 % Reduction in Area: -266% % Reduction in Volume: -266.2% Epithelialization: Small (1-33%) Tunneling: No Undermining: No Wound Description Classification: Grade 2 Foul Odor Wound Margin: Flat and Intact Slough/Fi Exudate Amount: Large Exudate Type: Serous Exudate Color: amber After Cleansing: No brino Yes Wound Bed Granulation Amount: Medium (34-66%) Exposed Structure Granulation Quality: Red Fascia Exposed: Yes Necrotic Amount: Medium (34-66%) Fat Layer (Subcutaneous Tissue) Exposed: Yes Necrotic Quality: Adherent Slough Tendon Exposed: Yes Muscle Exposed: No Joint Exposed: No Bone Exposed: No Periwound Skin  Texture Texture Color No Abnormalities Noted: No No Abnormalities Noted: No Callus:  No Atrophie Blanche: No Saur, Cilicia P. ([id number]) Crepitus: No Cyanosis: No Excoriation: No Ecchymosis: No Induration: No Erythema: No Rash: No Hemosiderin Staining: Yes Scarring: Yes Mottled: No Pallor: No Moisture Rubor: No No Abnormalities Noted: No Dry / Scaly: No Temperature / Pain Maceration: Yes Temperature: No Abnormality Tenderness on Palpation: Yes Wound Preparation Ulcer Cleansing: Rinsed/Irrigated with Saline Topical Anesthetic Applied: Other: lidocaine 4%, Treatment Notes Wound #1 (Left, Anterior Lower Leg) 1. Cleansed with: Clean wound with Normal Saline Notes hydrogel and nonstick pad applied to leg, conform only to toe Electronic Signature(s) Signed: 12/23/2017 5:01:37 PM By: Gretta Cool, BSN, RN, CWS, Kim RN, BSN Entered By: Gretta Cool, BSN, RN, CWS, Kim on 12/23/2017 12:55:44 Neff, [name] ([id number]) -------------------------------------------------------------------------------- Wound Assessment Details Patient Name: Alexa Lowes P. Date of Service: 12/23/2017 12:30 PM Medical Record Number: [id number] Patient Account Number: [id number] Date of Birth/Sex: 12-25-1925 (84 y.o. Female) Treating RN: Cornell Barman Primary Care Aryav Wimberly: Lucianne Lei Other Clinician: Referring Mayzee Reichenbach: Lucianne Lei Treating Darlette Dubow/Extender: Cathie Olden in Treatment: 29 Wound Status Wound Number: 2 Primary Arterial Insufficiency Ulcer Etiology: Wound Location: Left Toe Great - Plantar Wound Status: Open Wounding Event: Trauma Comorbid Anemia, Hypertension, Type II Diabetes, Date Acquired: 10/28/2017 History: Osteoarthritis Weeks Of Treatment: 8 Clustered Wound: No Photos Wound Measurements Length: (cm) 1 Width: (cm) 1 Depth: (cm) 0.2 Area: (cm) 0.785 Volume: (cm) 0.157 % Reduction in Area: -256.8% % Reduction in Volume: -613.6% Epithelialization: None Tunneling: No Undermining: No Wound Description Full Thickness Without Exposed Support  Foul Od Classification: Structures Slough/ Wound Margin: Flat and Intact Exudate None Present Amount: or After Cleansing: No Fibrino No Wound Bed Granulation Amount: None Present (0%) Exposed Structure Necrotic Amount: Large (67-100%) Fascia Exposed: No Necrotic Quality: Eschar Fat Layer (Subcutaneous Tissue) Exposed: Yes Tendon Exposed: No Muscle Exposed: No Joint Exposed: No Bone Exposed: No Periwound Skin Texture Texture Color No Abnormalities Noted: No No Abnormalities Noted: No Callus: Yes Atrophie Blanche: No Crepitus: No Cyanosis: No Popescu, Crystalynn P. ([id number]) Excoriation: No Ecchymosis: No Induration: No Erythema: No Rash: No Hemosiderin Staining: No Scarring: No Mottled: No Pallor: No Moisture Rubor: No No Abnormalities Noted: No Dry / Scaly: No Temperature / Pain Maceration: No Temperature: No Abnormality Wound Preparation Ulcer Cleansing: Rinsed/Irrigated with Saline Topical Anesthetic Applied: Other: lidocaine 4%, Treatment Notes Wound #2 (Left, Plantar Toe Great) 1. Cleansed with: Clean wound with Normal Saline Notes hydrogel and nonstick pad applied to leg, conform only to toe Electronic Signature(s) Signed: 12/23/2017 5:01:37 PM By: Gretta Cool, BSN, RN, CWS, Kim RN, BSN Entered By: Gretta Cool, BSN, RN, CWS, Kim on 12/23/2017 12:56:17 [place], [name] ([id number]) -------------------------------------------------------------------------------- Vitals Details Patient Name: Alexa Lowes P. Date of Service: 12/23/2017 12:30 PM Medical Record Number: [id number] Patient Account Number: [id number] Date of Birth/Sex: 1926/02/07 (84 y.o. Female) Treating RN: Cornell Barman Primary Care Khaliel Morey: Lucianne Lei Other Clinician: Referring Veronika Heard: Lucianne Lei Treating Brehanna Deveny/Extender: Cathie Olden in Treatment: 29 Vital Signs Time Taken: 12:43 Temperature (F): 98.3 Height (in): 65 Pulse (bpm): 99 Weight (lbs): 131 Respiratory Rate  (breaths/min): 16 Body Mass Index (BMI): 21.8 Blood Pressure (mmHg): 131/58 Reference Range: 80 - 120 mg / dl Electronic Signature(s) Signed: 12/23/2017 5:01:37 PM By: Gretta Cool, BSN, RN, CWS, Kim RN, BSN Entered By: Gretta Cool, BSN, RN, CWS, Kim on 12/23/2017 12:43:48

## 2017-12-25 LAB — TYPE AND SCREEN
ABO/RH(D): B POS
Antibody Screen: NEGATIVE
UNIT DIVISION: 0

## 2017-12-25 LAB — BPAM RBC
Blood Product Expiration Date: 201902272359
ISSUE DATE / TIME: 201901251715
Unit Type and Rh: 7300

## 2017-12-25 LAB — GLUCOSE, CAPILLARY
GLUCOSE-CAPILLARY: 123 mg/dL — AB (ref 65–99)
GLUCOSE-CAPILLARY: 118 mg/dL — AB (ref 65–99)
GLUCOSE-CAPILLARY: 205 mg/dL — AB (ref 65–99)
Glucose-Capillary: 106 mg/dL — ABNORMAL HIGH (ref 65–99)

## 2017-12-25 MED ORDER — VALSARTAN-HYDROCHLOROTHIAZIDE 320-25 MG PO TABS: 1.0000 | ORAL_TABLET | Freq: Every day | ORAL | Status: DC

## 2017-12-25 NOTE — Progress Notes (Addendum)
[name] at [place] NAME: [name]    MR#:  [id number]  DATE OF BIRTH:  06-Dec-1925  SUBJECTIVE:  S/p left toe surgery No new complaints  REVIEW OF SYSTEMS:   Review of Systems  Constitutional: Negative for chills, fever and weight loss.  HENT: Negative for ear discharge, ear pain and nosebleeds.   Eyes: Negative for blurred vision, pain and discharge.  Respiratory: Negative for sputum production, shortness of breath, wheezing and stridor.   Cardiovascular: Negative for chest pain, palpitations, orthopnea and PND.  Gastrointestinal: Negative for abdominal pain, diarrhea, nausea and vomiting.  Genitourinary: Negative for frequency and urgency.  Musculoskeletal: Negative for back pain and joint pain.  Neurological: Positive for weakness. Negative for sensory change, speech change and focal weakness.  Psychiatric/Behavioral: Negative for depression and hallucinations. The patient is not nervous/anxious.    Tolerating Diet:yes Tolerating PT: pending  DRUG ALLERGIES:  No Known Allergies  VITALS:  Blood pressure (!) 152/73, pulse 88, temperature 99.2 F (37.3 C), temperature source Oral, resp. rate 18, height 5\' 5"  (1.651 m), weight 59.7 kg (131 lb 9.8 oz), SpO2 100 %.  PHYSICAL EXAMINATION:   Physical Exam  GENERAL:  84 y.o.-year-old patient lying in the bed with no acute distress.  EYES: Pupils equal, round, reactive to light and accommodation. No scleral icterus. Extraocular muscles intact.  HEENT: Head atraumatic, normocephalic. Oropharynx and nasopharynx clear.  NECK:  Supple, no jugular venous distention. No thyroid enlargement, no tenderness.  LUNGS: Normal breath sounds bilaterally, no wheezing, rales, rhonchi. No use of accessory muscles of respiration.  CARDIOVASCULAR: S1, S2 normal. No murmurs, rubs, or gallops.  ABDOMEN: Soft, nontender, nondistended. Bowel sounds present. No organomegaly or mass.   EXTREMITIES: No cyanosis, clubbing or edema b/l.   Left foot dressing+ NEUROLOGIC: Cranial nerves II through XII are intact. No focal Motor or sensory deficits b/l.   PSYCHIATRIC:  patient is alert and oriented x 3.  SKIN: No obvious rash, lesion, or ulcer.   LABORATORY PANEL:  CBC Recent Labs  Lab 12/24/17 0358 12/24/17 2123  WBC 16.5*  --   HGB 7.1* 8.2*  HCT 22.5*  --   PLT 448*  --     Chemistries  Recent Labs  Lab 12/23/17 1432 12/24/17 0358  NA 137 136  K 4.2 3.9  CL 102 104  CO2 25 24  GLUCOSE 112* 200*  BUN 14 12  CREATININE 1.01* 0.86  CALCIUM 9.1 8.3*  AST 27  --   ALT 12*  --   ALKPHOS 68  --   BILITOT 0.5  --    Cardiac Enzymes No results for input(s): TROPONINI in the last 168 hours. RADIOLOGY:  Dg Toe Great Left  Result Date: 12/23/2017 CLINICAL DATA:  Necrosis at the great toe. Infection. Diabetic ulcer. EXAM: LEFT GREAT TOE COMPARISON:  None. FINDINGS: Gas is present in the medial and lateral aspect of great toe. No radiopaque foreign body is present. There is no osseous erosion. Mild generalized osteopenia is present. IMPRESSION: 1. Gas within the soft tissues at the IP joint of the great toe is likely associated to the plantar ulceration. This is most concerning for soft tissue infection. 2. No significant osseous abnormality associated with the infection. Electronically Signed   By: San Morelle M.D.   On: 12/23/2017 15:00   ASSESSMENT AND PLAN:   [name]  is a 84 y.o. female with a known history of renal artery disease, hypertension, Dm-2,hyperlipidemia  sent from wound care because of necrotic left great toe.  Noticed that the left great toe became black since 2 days   #1. sepsis present on admission with elevated lactic acid, elevated white count secondary to left great toe cellulitis, gas gangrene: - Continue vancomycin, Zosyn, IV fluids -POD # 1 left 2nd toe amputation by dr cline -Bc negative WC Gpc and rare GNR  2.  Left  great toe gangrene: s/p amputation  3.  History of peripheral artery disease -pt is s/p left leg angiogram, stent placement in September -dr schnier plans angiogram on Monday -will resume plavix after angio on monday  4.acute on chronic anemia -hgb down to 7.1--1 unit BT today  5. DM-2 Cont ssi Will resume metformin 48 hours after angiogram  PT to see pt  D/w family   Case discussed with Care Management/Social Worker. Management plans discussed with the patient, family and they are in agreement.  CODE STATUS: Full  DVT Prophylaxis: lovenox  TOTAL TIME TAKING CARE OF THIS PATIENT:30 minutes.  >50% time spent on counselling and coordination of care  POSSIBLE D/C IN2-3DAYS, DEPENDING ON CLINICAL CONDITION.  Note: This dictation was prepared with Dragon dictation along with smaller phrase technology. Any transcriptional errors that result from this process are unintentional.  Fritzi Mandes M.D on 12/25/2017 at 8:15 AM  Between 7am to 6pm - Pager - [phone]  After 6pm go to [email or web address] - password [email or web address] [facility]  Office  [phone]  CC: Primary care physician; Lucianne Lei, MDPatient ID: [name], female   DOB: 1926/08/08, 84 y.o.   MRN: [id number]

## 2017-12-25 NOTE — Progress Notes (Signed)
[name] at [place] NAME: [name]    MR#:  [id number]  DATE OF BIRTH:  1926/05/21  SUBJECTIVE:  S/p left toe surgery No new complaints  REVIEW OF SYSTEMS:   Review of Systems  Constitutional: Negative for chills, fever and weight loss.  HENT: Negative for ear discharge, ear pain and nosebleeds.   Eyes: Negative for blurred vision, pain and discharge.  Respiratory: Negative for sputum production, shortness of breath, wheezing and stridor.   Cardiovascular: Negative for chest pain, palpitations, orthopnea and PND.  Gastrointestinal: Negative for abdominal pain, diarrhea, nausea and vomiting.  Genitourinary: Negative for frequency and urgency.  Musculoskeletal: Negative for back pain and joint pain.  Neurological: Positive for weakness. Negative for sensory change, speech change and focal weakness.  Psychiatric/Behavioral: Negative for depression and hallucinations. The patient is not nervous/anxious.    Tolerating Diet:yes Tolerating PT: pending  DRUG ALLERGIES:  No Known Allergies  VITALS:  Blood pressure (!) 152/73, pulse 88, temperature 99.2 F (37.3 C), temperature source Oral, resp. rate 18, height 5\' 5"  (1.651 m), weight 59.7 kg (131 lb 9.8 oz), SpO2 100 %.  PHYSICAL EXAMINATION:   Physical Exam  GENERAL:  84 y.o.-year-old patient lying in the bed with no acute distress.  EYES: Pupils equal, round, reactive to light and accommodation. No scleral icterus. Extraocular muscles intact.  HEENT: Head atraumatic, normocephalic. Oropharynx and nasopharynx clear.  NECK:  Supple, no jugular venous distention. No thyroid enlargement, no tenderness.  LUNGS: Normal breath sounds bilaterally, no wheezing, rales, rhonchi. No use of accessory muscles of respiration.  CARDIOVASCULAR: S1, S2 normal. No murmurs, rubs, or gallops.  ABDOMEN: Soft, nontender, nondistended. Bowel sounds present. No organomegaly or mass.   EXTREMITIES: No cyanosis, clubbing or edema b/l.   Left foot dressing+ NEUROLOGIC: Cranial nerves II through XII are intact. No focal Motor or sensory deficits b/l.   PSYCHIATRIC:  patient is alert and oriented x 3.  SKIN: No obvious rash, lesion, or ulcer.   LABORATORY PANEL:  CBC Recent Labs  Lab 12/24/17 0358 12/24/17 2123  WBC 16.5*  --   HGB 7.1* 8.2*  HCT 22.5*  --   PLT 448*  --     Chemistries  Recent Labs  Lab 12/23/17 1432 12/24/17 0358  NA 137 136  K 4.2 3.9  CL 102 104  CO2 25 24  GLUCOSE 112* 200*  BUN 14 12  CREATININE 1.01* 0.86  CALCIUM 9.1 8.3*  AST 27  --   ALT 12*  --   ALKPHOS 68  --   BILITOT 0.5  --    Cardiac Enzymes No results for input(s): TROPONINI in the last 168 hours. RADIOLOGY:  Dg Toe Great Left  Result Date: 12/23/2017 CLINICAL DATA:  Necrosis at the great toe. Infection. Diabetic ulcer. EXAM: LEFT GREAT TOE COMPARISON:  None. FINDINGS: Gas is present in the medial and lateral aspect of great toe. No radiopaque foreign body is present. There is no osseous erosion. Mild generalized osteopenia is present. IMPRESSION: 1. Gas within the soft tissues at the IP joint of the great toe is likely associated to the plantar ulceration. This is most concerning for soft tissue infection. 2. No significant osseous abnormality associated with the infection. Electronically Signed   By: San Morelle M.D.   On: 12/23/2017 15:00   ASSESSMENT AND PLAN:   [name]  is a 84 y.o. female with a known history of renal artery disease, hypertension, Dm-2,hyperlipidemia  sent from wound care because of necrotic left great toe.  Noticed that the left great toe became black since 2 days   #1. sepsis present on admission with elevated lactic acid, elevated white count secondary to left great toe cellulitis, gas gangrene - Continue vancomycin, Zosyn, IV fluids -POD # 2 left 2nd toe amputation by dr cline -Bc negative WC Gpc and rare GNR  2.  Left  great toe gangrene: s/p amputation  3.  History of peripheral artery disease -pt is s/p left leg angiogram, stent placement in September -dr schnier plans angiogram on Monday -will resume plavix after angio on monday  4.acute on chronic anemia -hgb down to 7.1--1 unit BT --8.2  5. DM-2 Cont ssi Will resume metformin 48 hours after angiogram  PT to see pt  D/w family   Case discussed with Care Management/Social Worker. Management plans discussed with the patient, family and they are in agreement.  CODE STATUS: Full  DVT Prophylaxis: lovenox  TOTAL TIME TAKING CARE OF THIS PATIENT:30 minutes.  >50% time spent on counselling and coordination of care  POSSIBLE D/C IN2-3DAYS, DEPENDING ON CLINICAL CONDITION.  Note: This dictation was prepared with Dragon dictation along with smaller phrase technology. Any transcriptional errors that result from this process are unintentional.  [name] M.D on 12/25/2017 at 8:31 AM  Between 7am to 6pm - Pager - [phone]  After 6pm go to [email or web address] - password [email or web address] [facility]  Office  [phone]  CC: Primary care physician; Lucianne Lei, MDPatient ID: [name], female   DOB: August 25, 1926, 84 y.o.   MRN: [id number]

## 2017-12-25 NOTE — Evaluation (Signed)
Physical Therapy Evaluation Patient Details Name: [name] MRN: [id number] DOB: 1926-01-13 Today's Date: 12/25/2017   History of Present Illness  [name] is an 84 y.o. female. Pt is now s/p Amputation left great toe.  Debridement infected tissue left forefoot on 12/23/2016.  Clinical Impression  Pt presents to PT s/p great toe amputation and is Mod I for all functional mobility.  Pt ambulates 200' with RW and surgical shoe on L foot and is able to go up/down 4 steps with supervision assist.  Pt without complaints of pain and educated on keeping L leg elevated and how to perform seated therex.  All education completed this date.  Pt would benefit from post acute HHPT to address LE weakness and to prevent functional decline.    Follow Up Recommendations Home health PT    Equipment Recommendations  None recommended by PT(has RW and cane at home)    Recommendations for Other Services       Precautions / Restrictions Precautions Precautions: None Restrictions Weight Bearing Restrictions: No Other Position/Activity Restrictions: surgical shoe      Mobility  Bed Mobility  General bed mobility comments: Pt received and returned to recliner.  Transfers Overall transfer level: Modified independent Equipment used: Rolling walker (2 wheeled)  General transfer comment: Sit<>stand from recliner with RW and Mod I assist, rising slowly wihtout LOB.  Ambulation/Gait Ambulation/Gait assistance: Supervision Ambulation Distance (Feet): 200 Feet Assistive device: Rolling walker (2 wheeled) Gait Pattern/deviations: WFL(Within Functional Limits)     General Gait Details: Pt amb with steady step though gait using RW and surgical shoe on L foot and pt's own shoe on R foot.  Stairs Stairs: Yes Stairs assistance: Supervision Stair Management: One rail Right Number of Stairs: 4 General stair comments: Up/down 4 steps with 1 rail and verbal cues for step to  sequencing.  Wheelchair Mobility    Modified Rankin (Stroke Patients Only)       Balance Overall balance assessment: Modified Independent        Pertinent Vitals/Pain Pain Assessment: No/denies pain    Home Living Family/patient expects to be discharged to:: Private residence Living Arrangements: Children Available Help at Discharge: Available PRN/intermittently;Family Type of Home: House Home Access: Stairs to enter Entrance Stairs-Rails: Right Entrance Stairs-Number of Steps: 2 Home Layout: One level Home Equipment: Shower seat;Grab bars - toilet;Grab bars - tub/shower;Cane - single point;Walker - 2 wheels      Prior Function Level of Independence: Independent with assistive device(s)       Comments: Independent with ADL's, uses a stool to sit on while cooking     Hand Dominance        Extremity/Trunk Assessment   Upper Extremity Assessment Upper Extremity Assessment: Overall WFL for tasks assessed    Lower Extremity Assessment Lower Extremity Assessment: Generalized weakness(4/5 knee extension)       Communication   Communication: No difficulties  Cognition Arousal/Alertness: Awake/alert Behavior During Therapy: WFL for tasks assessed/performed Overall Cognitive Status: Within Functional Limits for tasks assessed    General Comments: Plesant and answers all questions appropriately      General Comments General comments (skin integrity, edema, etc.): surgical dressing intact    Exercises General Exercises - Lower Extremity Ankle Circles/Pumps: AROM;Both;10 reps Ahmiyah Coil Arc Quad: AROM;Both;10 reps Heel Slides: AROM;Both;10 reps Straight Leg Raises: Strengthening;Both;10 reps Hip Flexion/Marching: AROM;Both;10 reps   Assessment/Plan    PT Assessment Patent does not need any further PT services  PT Problem List  PT Treatment Interventions      PT Goals (Current goals can be found in the Care Plan section)  Acute Rehab PT  Goals Patient Stated Goal: To get well. PT Goal Formulation: With patient/family Time For Goal Achievement: 01/01/18 Potential to Achieve Goals: Good    Frequency     Barriers to discharge None    Co-evaluation        AM-PAC PT "6 Clicks" Daily Activity  Outcome Measure Difficulty turning over in bed (including adjusting bedclothes, sheets and blankets)?: None Difficulty moving from lying on back to sitting on the side of the bed? : A Little Difficulty sitting down on and standing up from a chair with arms (e.g., wheelchair, bedside commode, etc,.)?: None Help needed moving to and from a bed to chair (including a wheelchair)?: None Help needed walking in hospital room?: None Help needed climbing 3-5 steps with a railing? : A Little 6 Click Score: 22    End of Session Equipment Utilized During Treatment: Gait belt Activity Tolerance: Patient tolerated treatment well Patient left: in chair;with call bell/phone within reach;with family/visitor present Nurse Communication: Mobility status PT Visit Diagnosis: Other abnormalities of gait and mobility (R26.89)    Time: 5615-3794 PT Time Calculation (min) (ACUTE ONLY): 26 min   Charges:   PT Evaluation $PT Eval Low Complexity: 1 Low PT Treatments $Gait Training: 8-22 mins   PT G Codes:        Jamirah Zelaya A Emary Zalar, PT 12/25/2017, 4:09 PM

## 2017-12-26 LAB — COMPREHENSIVE METABOLIC PANEL
ALT: 10 U/L — ABNORMAL LOW (ref 14–54)
AST: 20 U/L (ref 15–41)
Albumin: 2.5 g/dL — ABNORMAL LOW (ref 3.5–5.0)
Alkaline Phosphatase: 59 U/L (ref 38–126)
Anion gap: 8 (ref 5–15)
BUN: 12 mg/dL (ref 6–20)
CHLORIDE: 103 mmol/L (ref 101–111)
CO2: 27 mmol/L (ref 22–32)
Calcium: 8.8 mg/dL — ABNORMAL LOW (ref 8.9–10.3)
Creatinine, Ser: 1.04 mg/dL — ABNORMAL HIGH (ref 0.44–1.00)
GFR, EST AFRICAN AMERICAN: 54 mL/min — AB (ref >60)
GFR, EST NON AFRICAN AMERICAN: 46 mL/min — AB (ref >60)
Glucose, Bld: 120 mg/dL — ABNORMAL HIGH (ref 65–99)
POTASSIUM: 3.2 mmol/L — AB (ref 3.5–5.1)
Sodium: 138 mmol/L (ref 135–145)
Total Bilirubin: 0.6 mg/dL (ref 0.3–1.2)
Total Protein: 8.4 g/dL — ABNORMAL HIGH (ref 6.5–8.1)

## 2017-12-26 LAB — CBC WITH DIFFERENTIAL/PLATELET
BASOS ABS: 0.1 10*3/uL (ref 0–0.1)
Basophils Relative: 1 %
EOS ABS: 0.2 10*3/uL (ref 0–0.7)
Eosinophils Relative: 2 %
HCT: 29.4 % — ABNORMAL LOW (ref 35.0–47.0)
Hemoglobin: 9.6 g/dL — ABNORMAL LOW (ref 12.0–16.0)
LYMPHS ABS: 1.9 10*3/uL (ref 1.0–3.6)
Lymphocytes Relative: 14 %
MCH: 24.6 pg — AB (ref 26.0–34.0)
MCHC: 32.8 g/dL (ref 32.0–36.0)
MCV: 74.9 fL — AB (ref 80.0–100.0)
MONO ABS: 1.3 10*3/uL — AB (ref 0.2–0.9)
Monocytes Relative: 10 %
Neutro Abs: 9.9 10*3/uL — ABNORMAL HIGH (ref 1.4–6.5)
Neutrophils Relative %: 73 %
PLATELETS: 550 10*3/uL — AB (ref 150–440)
RBC: 3.92 MIL/uL (ref 3.80–5.20)
RDW: 21.3 % — AB (ref 11.5–14.5)
WBC: 13.4 10*3/uL — ABNORMAL HIGH (ref 3.6–11.0)

## 2017-12-26 LAB — GLUCOSE, CAPILLARY
GLUCOSE-CAPILLARY: 251 mg/dL — AB (ref 65–99)
GLUCOSE-CAPILLARY: 49 mg/dL — AB (ref 65–99)
GLUCOSE-CAPILLARY: 120 mg/dL — AB (ref 65–99)
Glucose-Capillary: 114 mg/dL — ABNORMAL HIGH (ref 65–99)
Glucose-Capillary: 209 mg/dL — ABNORMAL HIGH (ref 65–99)

## 2017-12-26 MED ORDER — SODIUM CHLORIDE 0.9 % IV SOLN
INTRAVENOUS | Status: DC
  Administered 2017-12-26 – 2017-12-27 (×3): via INTRAVENOUS

## 2017-12-26 NOTE — Progress Notes (Signed)
3 Days Post-Op   Subjective/Chief Complaint: Without complaint this morning. Pain controlled with current regimen. Granddaughter at bedside.   Objective: Vital signs in last 24 hours: Temp:  [97.7 F (36.5 C)-98.9 F (37.2 C)] 98.3 F (36.8 C) (01/27 0741) Pulse Rate:  [76-92] 87 (01/27 0741) Resp:  [16-20] 18 (01/27 0741) BP: (137-154)/(70-87) 152/87 (01/27 0741) SpO2:  [95 %-100 %] 99 % (01/27 0741) Weight:  [59.4 kg (131 lb)] 59.4 kg (131 lb) (01/27 0500) Last BM Date: 12/26/17  Intake/Output from previous day: 01/26 0701 - 01/27 0700 In: -  Out: 1 [Urine:1] Intake/Output this shift: No intake/output data recorded.  General appearance: alert and no distress Resp: clear to auscultation bilaterally Cardio: regular rate and rhythm, S1, S2 normal, no murmur, click, rub or gallop Extremities: palp femoral pulses, Left foot dressing in place.  Lab Results:  Recent Labs    12/24/17 0358 12/24/17 2123 12/26/17 0938  WBC 16.5*  --  13.4*  HGB 7.1* 8.2* 9.6*  HCT 22.5*  --  29.4*  PLT 448*  --  550*   BMET Recent Labs    12/24/17 0358 12/26/17 0938  NA 136 138  K 3.9 3.2*  CL 104 103  CO2 24 27  GLUCOSE 200* 120*  BUN 12 12  CREATININE 0.86 1.04*  CALCIUM 8.3* 8.8*   PT/INR No results for input(s): LABPROT, INR in the last 72 hours. ABG No results for input(s): PHART, HCO3 in the last 72 hours.  Invalid input(s): PCO2, PO2  Studies/Results: No results found.  Anti-infectives: Anti-infectives (From admission, onward)   Start     Dose/Rate Route Frequency Ordered Stop   12/24/17 1000  vancomycin (VANCOCIN) IVPB 750 mg/150 ml premix     750 mg 150 mL/hr over 60 Minutes Intravenous Every 24 hours 12/23/17 1534     12/23/17 2200  piperacillin-tazobactam (ZOSYN) IVPB 3.375 g     3.375 g 12.5 mL/hr over 240 Minutes Intravenous Every 8 hours 12/23/17 1534     12/23/17 1530  piperacillin-tazobactam (ZOSYN) IVPB 3.375 g     3.375 g 100 mL/hr over 30 Minutes  Intravenous  Once 12/23/17 1522 12/23/17 1621   12/23/17 1530  vancomycin (VANCOCIN) IVPB 1000 mg/200 mL premix     1,000 mg 200 mL/hr over 60 Minutes Intravenous  Once 12/23/17 1522 12/23/17 1721      Assessment/Plan: s/p Procedure(s): AMPUTATION TOE LEFT GREAT TOE (Left) Plan for left lower extremity angiogram with intervention tomorrow per Dr. Lucky Cowboy.  NPO after MN.  LOS: 3 days    Evaristo Bury 12/26/2017

## 2017-12-26 NOTE — Progress Notes (Signed)
[place] at [place] NAME: [name]    MR#:  [id number]  DATE OF BIRTH:  Jul 18, 1926  SUBJECTIVE:  S/p left toe surgery No new complaints  REVIEW OF SYSTEMS:   Review of Systems  Constitutional: Negative for chills, fever and weight loss.  HENT: Negative for ear discharge, ear pain and nosebleeds.   Eyes: Negative for blurred vision, pain and discharge.  Respiratory: Negative for sputum production, shortness of breath, wheezing and stridor.   Cardiovascular: Negative for chest pain, palpitations, orthopnea and PND.  Gastrointestinal: Negative for abdominal pain, diarrhea, nausea and vomiting.  Genitourinary: Negative for frequency and urgency.  Musculoskeletal: Negative for back pain and joint pain.  Neurological: Positive for weakness. Negative for sensory change, speech change and focal weakness.  Psychiatric/Behavioral: Negative for depression and hallucinations. The patient is not nervous/anxious.    Tolerating Diet:yes Tolerating PT:  home health PT  DRUG ALLERGIES:  No Known Allergies  VITALS:  Blood pressure (!) 152/87, pulse 87, temperature 98.3 F (36.8 C), temperature source Oral, resp. rate 18, height 5\' 5"  (1.651 m), weight 59.4 kg (131 lb), SpO2 99 %.  PHYSICAL EXAMINATION:   Physical Exam  GENERAL:  84 y.o.-year-old patient lying in the bed with no acute distress.  EYES: Pupils equal, round, reactive to light and accommodation. No scleral icterus. Extraocular muscles intact.  HEENT: Head atraumatic, normocephalic. Oropharynx and nasopharynx clear.  NECK:  Supple, no jugular venous distention. No thyroid enlargement, no tenderness.  LUNGS: Normal breath sounds bilaterally, no wheezing, rales, rhonchi. No use of accessory muscles of respiration.  CARDIOVASCULAR: S1, S2 normal. No murmurs, rubs, or gallops.  ABDOMEN: Soft, nontender, nondistended. Bowel sounds present. No organomegaly or mass.   EXTREMITIES: No cyanosis, clubbing or edema b/l.   Left foot dressing+ NEUROLOGIC: Cranial nerves II through XII are intact. No focal Motor or sensory deficits b/l.   PSYCHIATRIC:  patient is alert and oriented x 3.  SKIN: No obvious rash, lesion, or ulcer.   LABORATORY PANEL:  CBC Recent Labs  Lab 12/26/17 0938  WBC 13.4*  HGB 9.6*  HCT 29.4*  PLT 550*    Chemistries  Recent Labs  Lab 12/26/17 0938  NA 138  K 3.2*  CL 103  CO2 27  GLUCOSE 120*  BUN 12  CREATININE 1.04*  CALCIUM 8.8*  AST 20  ALT 10*  ALKPHOS 59  BILITOT 0.6   Cardiac Enzymes No results for input(s): TROPONINI in the last 168 hours. RADIOLOGY:  No results found. ASSESSMENT AND PLAN:   [name]  is a 84 y.o. female with a known history of renal artery disease, hypertension, Dm-2,hyperlipidemia sent from wound care because of necrotic left great toe.  Noticed that the left great toe became black since 2 days   #1. sepsis present on admission with elevated lactic acid, elevated white count secondary to left great toe cellulitis, gas gangrene - Continue vancomycin, Zosyn----consider de-escalating antibiotics -POD # 3 left 2nd toe amputation by dr cline -Bc negative WC Gpc and rare GNR  2.  Left great toe gangrene: s/p amputation  3.  History of peripheral artery disease -pt is s/p left leg angiogram, stent placement in September -dr schnier plans angiogram on Monday -will resume plavix after angio on monday  4.acute on chronic anemia -hgb down to 7.1--1 unit BT --8.2  5. DM-2 Cont ssi Will resume metformin 48 hours after angiogram  PT  recommends home health PT  D/w  family   Case discussed with Care Management/Social Worker. Management plans discussed with the patient, family and they are in agreement.  CODE STATUS: Full  DVT Prophylaxis: lovenox  TOTAL TIME TAKING CARE OF THIS PATIENT:30 minutes.  >50% time spent on counselling and coordination of care  POSSIBLE D/C  IN2-3DAYS, DEPENDING ON CLINICAL CONDITION.  Note: This dictation was prepared with Dragon dictation along with smaller phrase technology. Any transcriptional errors that result from this process are unintentional.  Fritzi Mandes M.D on 12/26/2017 at 12:35 PM  Between 7am to 6pm - Pager - [phone]  After 6pm go to [email or web address] - password [email or web address]  Office  [phone]  CC: Primary care physician; Lucianne Lei, MDPatient ID: [name], female   DOB: 1925-12-09, 84 y.o.   MRN: [id number]

## 2017-12-26 NOTE — Anesthesia Postprocedure Evaluation (Signed)
Anesthesia Post Note  Patient: [name]  Procedure(s) Performed: AMPUTATION TOE LEFT GREAT TOE (Left )  Patient location during evaluation: PACU Anesthesia Type: General Level of consciousness: awake and alert Pain management: pain level controlled Vital Signs Assessment: post-procedure vital signs reviewed and stable Respiratory status: spontaneous breathing, nonlabored ventilation, respiratory function stable and patient connected to nasal cannula oxygen Cardiovascular status: blood pressure returned to baseline and stable Postop Assessment: no apparent nausea or vomiting Anesthetic complications: no     Last Vitals:  Vitals:   12/26/17 0531 12/26/17 0741  BP: 140/81 (!) 152/87  Pulse: 84 87  Resp:  18  Temp: 36.7 C 36.8 C  SpO2: 99% 99%    Last Pain:  Vitals:   12/26/17 0741  TempSrc: Oral  PainSc: Asleep                 Martha Clan

## 2017-12-27 ENCOUNTER — Encounter
Admission: EM | Disposition: A | Discharge status: Home Health | Payer: Self-pay | Source: Home / Self Care | Attending: Internal Medicine

## 2017-12-27 DIAGNOSIS — I70248 Atherosclerosis of native arteries of left leg with ulceration of other part of lower left leg: Secondary | ICD-10-CM

## 2017-12-27 HISTORY — PX: ABDOMINAL AORTOGRAM W/LOWER EXTREMITY: CATH118223

## 2017-12-27 LAB — CBC
HCT: 27.6 % — ABNORMAL LOW (ref 35.0–47.0)
Hemoglobin: 8.9 g/dL — ABNORMAL LOW (ref 12.0–16.0)
MCH: 24.3 pg — AB (ref 26.0–34.0)
MCHC: 32.4 g/dL (ref 32.0–36.0)
MCV: 74.9 fL — ABNORMAL LOW (ref 80.0–100.0)
Platelets: 600 10*3/uL — ABNORMAL HIGH (ref 150–440)
RBC: 3.68 MIL/uL — AB (ref 3.80–5.20)
RDW: 21.3 % — ABNORMAL HIGH (ref 11.5–14.5)
WBC: 12.4 10*3/uL — AB (ref 3.6–11.0)

## 2017-12-27 LAB — GLUCOSE, CAPILLARY
GLUCOSE-CAPILLARY: 255 mg/dL — AB (ref 65–99)
Glucose-Capillary: 129 mg/dL — ABNORMAL HIGH (ref 65–99)
Glucose-Capillary: 109 mg/dL — ABNORMAL HIGH (ref 65–99)

## 2017-12-27 LAB — COMPREHENSIVE METABOLIC PANEL
ALK PHOS: 51 U/L (ref 38–126)
ALT: 9 U/L — AB (ref 14–54)
AST: 16 U/L (ref 15–41)
Albumin: 2.3 g/dL — ABNORMAL LOW (ref 3.5–5.0)
Anion gap: 11 (ref 5–15)
BUN: 12 mg/dL (ref 6–20)
CALCIUM: 8.5 mg/dL — AB (ref 8.9–10.3)
CHLORIDE: 102 mmol/L (ref 101–111)
CO2: 26 mmol/L (ref 22–32)
Creatinine, Ser: 0.97 mg/dL (ref 0.44–1.00)
GFR calc Af Amer: 58 mL/min — ABNORMAL LOW (ref >60)
GFR calc non Af Amer: 50 mL/min — ABNORMAL LOW (ref >60)
GLUCOSE: 124 mg/dL — AB (ref 65–99)
Potassium: 3 mmol/L — ABNORMAL LOW (ref 3.5–5.1)
SODIUM: 139 mmol/L (ref 135–145)
Total Bilirubin: 0.6 mg/dL (ref 0.3–1.2)
Total Protein: 8.3 g/dL — ABNORMAL HIGH (ref 6.5–8.1)

## 2017-12-27 LAB — VANCOMYCIN, TROUGH: Vancomycin Tr: 20 ug/mL (ref 15–20)

## 2017-12-27 LAB — MAGNESIUM: MAGNESIUM: 1.6 mg/dL — AB (ref 1.7–2.4)

## 2017-12-27 LAB — SURGICAL PATHOLOGY

## 2017-12-27 SURGERY — ABDOMINAL AORTOGRAM W/LOWER EXTREMITY
Anesthesia: Moderate Sedation | Laterality: Left

## 2017-12-27 MED ORDER — FENTANYL CITRATE (PF) 100 MCG/2ML IJ SOLN
INTRAMUSCULAR | Status: AC
  Filled 2017-12-27: qty 2

## 2017-12-27 MED ORDER — CLOPIDOGREL BISULFATE 75 MG PO TABS
75.0000 mg | ORAL_TABLET | Freq: Every day | ORAL | Status: DC
  Administered 2017-12-27 – 2017-12-28 (×2): 75 mg via ORAL
  Filled 2017-12-27 (×2): qty 1

## 2017-12-27 MED ORDER — POTASSIUM CHLORIDE CRYS ER 20 MEQ PO TBCR: 40.0000 meq | EXTENDED_RELEASE_TABLET | Freq: Once | ORAL | Status: DC

## 2017-12-27 MED ORDER — MIDAZOLAM HCL 5 MG/5ML IJ SOLN
INTRAMUSCULAR | Status: AC
  Filled 2017-12-27: qty 5

## 2017-12-27 MED ORDER — HEPARIN SODIUM (PORCINE) 1000 UNIT/ML IJ SOLN
INTRAMUSCULAR | Status: AC
  Filled 2017-12-27: qty 1

## 2017-12-27 MED ORDER — CEFAZOLIN SODIUM-DEXTROSE 2-4 GM/100ML-% IV SOLN
INTRAVENOUS | Status: AC
  Administered 2017-12-27: 2 g
  Filled 2017-12-27: qty 100

## 2017-12-27 MED ORDER — LIDOCAINE-EPINEPHRINE (PF) 1 %-1:200000 IJ SOLN
INTRAMUSCULAR | Status: AC
  Filled 2017-12-27: qty 30

## 2017-12-27 MED ORDER — VANCOMYCIN HCL 500 MG IV SOLR
500.0000 mg | INTRAVENOUS | Status: DC
  Filled 2017-12-27: qty 500

## 2017-12-27 MED ORDER — MIDAZOLAM HCL 2 MG/2ML IJ SOLN
INTRAMUSCULAR | Status: DC | PRN
  Administered 2017-12-27 (×2): 1 mg via INTRAVENOUS
  Administered 2017-12-27: 2 mg via INTRAVENOUS
  Administered 2017-12-27: 1 mg via INTRAVENOUS

## 2017-12-27 MED ORDER — FENTANYL CITRATE (PF) 100 MCG/2ML IJ SOLN
INTRAMUSCULAR | Status: DC | PRN
  Administered 2017-12-27 (×3): 50 ug via INTRAVENOUS
  Administered 2017-12-27 (×2): 25 ug via INTRAVENOUS

## 2017-12-27 MED ORDER — HEPARIN (PORCINE) IN NACL 2-0.9 UNIT/ML-% IJ SOLN
INTRAMUSCULAR | Status: AC
  Filled 2017-12-27: qty 1000

## 2017-12-27 MED ORDER — IOPAMIDOL (ISOVUE-300) INJECTION 61%
INTRAVENOUS | Status: DC | PRN
  Administered 2017-12-27: 70 mL via INTRAVENOUS

## 2017-12-27 MED ORDER — CEFAZOLIN SODIUM-DEXTROSE 2-4 GM/100ML-% IV SOLN: 2.0000 g | Freq: Once | INTRAVENOUS | Status: DC

## 2017-12-27 SURGICAL SUPPLY — 30 items
BALLN DORADO 5X60X80 (BALLOONS) ×3
BALLN LUTONIX 018 4X150X130 (BALLOONS) ×3
BALLN LUTONIX 5X220X130 (BALLOONS) ×3
BALLN STERLING OTW 5X80X135 (BALLOONS) ×3
BALLN ULTRVRSE 3X300X150 (BALLOONS) ×6
BALLN ULTRVRSE 3X300X150 OTW (BALLOONS) ×1
BALLOON DORADO 5X60X80 (BALLOONS) IMPLANT
BALLOON LUTONIX 018 4X150X130 (BALLOONS) IMPLANT
BALLOON LUTONIX 5X220X130 (BALLOONS) IMPLANT
BALLOON STERLING OTW 5X80X135 (BALLOONS) IMPLANT
BALLOON ULTRVRSE 3X300X150 (BALLOONS) IMPLANT
BALLOON ULTRVRSE 3X300X150 OTW (BALLOONS) ×1 IMPLANT
CATH BEACON 5 .038 100 VERT TP (CATHETERS) ×2 IMPLANT
CATH CXI SUPP ANG 2.6FR 150CM (MICROCATHETER) ×2 IMPLANT
CATH CXI SUPP ANG 4FR 135 (MICROCATHETER) IMPLANT
CATH CXI SUPP ANG 4FR 135CM (MICROCATHETER) ×3
CATH PIG 70CM (CATHETERS) ×3 IMPLANT
DEVICE PRESTO INFLATION (MISCELLANEOUS) ×2 IMPLANT
DEVICE STARCLOSE SE CLOSURE (Vascular Products) ×2 IMPLANT
DEVICE TORQUE (MISCELLANEOUS) ×2 IMPLANT
GLIDEWIRE ADV .014X300CM (WIRE) ×3 IMPLANT
GLIDEWIRE ADV .035X260CM (WIRE) ×3 IMPLANT
GUIDEWIRE PFTE-COATED .018X300 (WIRE) ×2 IMPLANT
PACK ANGIOGRAPHY (CUSTOM PROCEDURE TRAY) ×2 IMPLANT
SHEATH ANL2 6FRX45 HC (SHEATH) ×2 IMPLANT
SHEATH BRITE TIP 5FRX11 (SHEATH) ×2 IMPLANT
STENT VIABAHN 6X50X120 (Permanent Stent) ×2 IMPLANT
STENT VIABAHN 6X7.5X120 (Permanent Stent) ×2 IMPLANT
TUBING CONTRAST HIGH PRESS 72 (TUBING) ×2 IMPLANT
WIRE J 3MM .035X145CM (WIRE) ×3 IMPLANT

## 2017-12-27 NOTE — Progress Notes (Signed)
4 Days Post-Op   Subjective/Chief Complaint: Patient seen.  States she is feeling pretty good.   Objective: Vital signs in last 24 hours: Temp:  [98 F (36.7 C)-98.6 F (37 C)] 98.5 F (36.9 C) (01/28 0825) Pulse Rate:  [82-85] 84 (01/28 0825) Resp:  [15-18] 18 (01/28 0825) BP: (129-150)/(67-79) 148/76 (01/28 0939) SpO2:  [97 %-100 %] 99 % (01/28 0825) Weight:  [59.4 kg (131 lb)] 59.4 kg (131 lb) (01/28 0500) Last BM Date: 12/26/17  Intake/Output from previous day: 01/27 0701 - 01/28 0700 In: 495 [I.V.:245; IV Piggyback:250] Out: -  Intake/Output this shift: Total I/O In: 3 [I.V.:3] Out: -   Minimal blood and drainage noted on the.  Upon removal the medial aspect of the incision appears to be well coapted and dry.  Lateral aspect is still with some mild expressible blood and drainage but no significant purulence.  Lab Results:  Recent Labs    12/26/17 0938 12/27/17 0533  WBC 13.4* 12.4*  HGB 9.6* 8.9*  HCT 29.4* 27.6*  PLT 550* 600*   BMET Recent Labs    12/26/17 0938 12/27/17 0533  NA 138 139  K 3.2* 3.0*  CL 103 102  CO2 27 26  GLUCOSE 120* 124*  BUN 12 12  CREATININE 1.04* 0.97  CALCIUM 8.8* 8.5*   PT/INR No results for input(s): LABPROT, INR in the last 72 hours. ABG No results for input(s): PHART, HCO3 in the last 72 hours.  Invalid input(s): PCO2, PO2  Studies/Results: No results found.  Anti-infectives: Anti-infectives (From admission, onward)   Start     Dose/Rate Route Frequency Ordered Stop   12/28/17 1000  vancomycin (VANCOCIN) 500 mg in sodium chloride 0.9 % 100 mL IVPB     500 mg 100 mL/hr over 60 Minutes Intravenous Every 24 hours 12/27/17 1157     12/24/17 1000  vancomycin (VANCOCIN) IVPB 750 mg/150 ml premix  Status:  Discontinued     750 mg 150 mL/hr over 60 Minutes Intravenous Every 24 hours 12/23/17 1534 12/27/17 1157   12/23/17 2200  piperacillin-tazobactam (ZOSYN) IVPB 3.375 g     3.375 g 12.5 mL/hr over 240 Minutes  Intravenous Every 8 hours 12/23/17 1534     12/23/17 1530  piperacillin-tazobactam (ZOSYN) IVPB 3.375 g     3.375 g 100 mL/hr over 30 Minutes Intravenous  Once 12/23/17 1522 12/23/17 1621   12/23/17 1530  vancomycin (VANCOCIN) IVPB 1000 mg/200 mL premix     1,000 mg 200 mL/hr over 60 Minutes Intravenous  Once 12/23/17 1522 12/23/17 1721      Assessment/Plan: s/p Procedure(s): AMPUTATION TOE LEFT GREAT TOE (Left) Assessment: Stable status post amputation left great toe.   Plan: Betadine and a sterile bandage reapplied.  Patient is awaiting her vascular procedure this afternoon.  At this point I would say the patient may be stable from her amputation site to be discharged on oral antibiotics and we will monitor outpatient with home health care.  Reevaluate tomorrow after her vascular procedure.  LOS: 4 days    Durward Fortes 12/27/2017

## 2017-12-27 NOTE — OR Nursing (Signed)
Dr Lucky Cowboy notified pt on round clock Vancomyacin, last dose this am. No additional antibiotics needed.

## 2017-12-27 NOTE — Op Note (Addendum)
[facility] Percutaneous Study/Intervention Procedural Note   Date of Surgery:  12/27/2017  Surgeon(s):Anastasios Melander   Assistants:none  Pre-operative Diagnosis: PAD with ulceration and infection left lower extremity  Post-operative diagnosis: Same  Procedure(s) Performed: 1. Ultrasound guidance for vascular access right femoral artery 2. Catheter placement into left peroneal artery from right femoral approach 3. Aortogram and selective left lower extremity angiogram 4. Percutaneous transluminal angioplasty of left peroneal artery and tibioperoneal trunk with 3 mm diameter by 30 cm length angioplasty balloon 5. Percutaneous transluminal angioplasty of the left popliteal artery with 4 mm diameter by 15 cm length Lutonix drug-coated angioplasty balloon  6.  Percutaneous transluminal angioplasty of the mid and distal superficial femoral artery with 5 mm diameter by 22 cm length Lutonix drug-coated angioplasty balloon  7.  Viabahn stent placement to the left above-knee popliteal artery with 6 mm diameter by 7.5 cm length stent for high-grade residual stenosis after angioplasty  8.  Viabahn stent placement to the mid SFA with 6 mm diameter by 5 cm stent for rater than 50% residual stenosis after angioplasty 9. StarClose closure device right femoral artery  EBL: 10 cc  Contrast: 70 cc  Fluoro Time: 14.7 minutes  Moderate Conscious Sedation Time: approximately 65 minutes using 5 mg of Versed and 200 Mcg of Fentanyl  Indications: Patient is a 84 y.o.female with limb threatening ischemia with an infected ulceration on the left lower extremity and a known history of multiple previous surgeries or interventions. The patient is brought in for angiography for further evaluation and potential treatment.  Due to the limb threatening nature of the situation, angiogram was performed for  attempted limb salvage. Risks and benefits are discussed and informed consent is obtained  Procedure: The patient was identified and appropriate procedural time out was performed. The patient was then placed supine on the table and prepped and draped in the usual sterile fashion.Moderate conscious sedation was administered during a face to face encounter with the patient throughout the procedure with my supervision of the RN administering medicines and monitoring the patient's vital signs, pulse oximetry, telemetry and mental status throughout from the start of the procedure until the patient was taken to the recovery room. Ultrasound was used to evaluate the right common femoral artery. It was patent . A digital ultrasound image was acquired. A Seldinger needle was used to access the right common femoral artery under direct ultrasound guidance and a permanent image was performed. A 0.035 J wire was advanced without resistance and a 5Fr sheath was placed. Pigtail catheter was placed into the aorta and an AP aortogram was performed. This demonstrated normal renal arteries and an aorta that was highly irregular but not stenotic.  The right iliac system was calcified but had no significant stenosis.  The previously placed left iliac stents had good flow although were a couple of areas of mild stenosis in 30-40% range.  The left external iliac artery also had a mild stenosis in 30-40% range above the previous endarterectomy but this did not appear high-grade. I then crossed the aortic bifurcation and advanced to the left femoral head. Selective left lower extremity angiogram was then performed. This demonstrated that the profunda femoris artery appeared to be reasonably normal.  The proximal SFA was reasonably normal.  The common femoral artery was widely patent.  This was all indicative of a successful previous endarterectomy.  In the mid SFA, there was a short segment occlusion.  This was followed several  centimeters later in  the distal SFA and Hunter's canal area with 2 more short segment high-grade stenoses or occlusions.  There was then another short segment occlusion in the above-knee popliteal artery with disease going into the below-knee popliteal artery.  The tibioperoneal trunk had a high-grade stenosis and the peroneal artery was the only runoff distally. The patient was systemically heparinized and a 6 [place] Ansell sheath was then placed over the [employer] wire. I then used a Kumpe catheter and the advantage wire to navigate through the SFA and popliteal lesions, but the catheter would not get past the above-knee popliteal lesion.  I then had to exchange for a 0.035 CXI catheter and eventually telescope a 0.018 CXI catheter through this and exchanged for a 0.018 advantage wire.  Using this, I advanced down through the tibioperoneal trunk lesion and down into the peroneal artery and exchanged for a 0.014 wire I then used a 3 mm diameter by 30 cm length angioplasty balloon to treat the proximal peroneal artery and tibioperoneal trunk lesion as well as to predilate the popliteal and SFA lesions.  The treatment inflation was to 8 atm for 1 minute in the peroneal artery and tibioperoneal trunk.  I predilated the SFA and popliteal lesions with an inflation to 12 atm for 1 minute.  I then proceeded with a 4 mm diameter by 15 cm length Lutonix drug-coated angioplasty balloon in the popliteal artery.  This was inflated to 12 atm for 1 minute.  A 5 mm diameter by 22 cm length Lutonix drug-coated angioplasty balloon was then used to treat the mid and distal superficial femoral artery to encompass 3 lesions.  This was inflated to 10 atm for 1 minute.  The completion angiogram showed about a 60% residual stenosis at the most proximal lesion in the mid SFA.  The distal SFA at Hunter's canal lesions had less than 20% stenosis.  The above-knee popliteal lesion had about an 80% residual stenosis.  The tibioperoneal  trunk and proximal peroneal artery had less than 20% residual stenosis.  A 6 mm diameter by 7.5 cm length Viabahn stent was then deployed in the above-knee popliteal artery and postdilated with a high pressure 5 mm balloon up to 20 atm.  Following this, there was about a 20% residual stenosis.  The mid SFA lesion was treated with a 6 mm diameter by 5 cm length Viabahn stent and postdilated with a high pressure 5 mm balloon to 24 atm.  With following this, there was about a 30-35% residual stenosis.  The flow at this point was markedly improved. I elected to terminate the procedure. The sheath was removed and StarClose closure device was deployed in the right femoral artery with excellent hemostatic result. The patient was taken to the recovery room in stable condition having tolerated the procedure well.  Findings:  Aortogram: This demonstrated normal renal arteries and an aorta that was highly irregular but not stenotic.  The right iliac system was calcified but had no significant stenosis.  The previously placed left iliac stents had good flow although were a couple of areas of mild stenosis in 30-40% range.  The left external iliac artery also had a mild stenosis in 30-40% range above the previous endarterectomy but this did not appear high-grade Left lower Extremity:The profunda femoris artery appeared to be reasonably normal.  The proximal SFA was reasonably normal.  The common femoral artery was widely patent.  This was all indicative of a successful previous endarterectomy.  In the mid SFA, there  was a short segment occlusion.  This was followed several centimeters later in the distal SFA and Hunter's canal area with 2 more short segment high-grade stenoses or occlusions.  There was then another short segment occlusion in the above-knee popliteal artery with disease going into the below-knee popliteal artery.  The tibioperoneal trunk had a high-grade stenosis and the peroneal  artery was the only runoff distally.   Disposition: Patient was taken to the recovery room in stable condition having tolerated the procedure well.  Complications: None  Leotis Pain 12/27/2017 4:24 PM   This note was created with Dragon Medical transcription system. Any errors in dictation are purely unintentional.

## 2017-12-27 NOTE — Progress Notes (Addendum)
ANTIBIOTIC CONSULT NOTE - INITIAL  Pharmacy Consult for Zosyn and vancomycin Indication: sepsis  No Known Allergies  Patient Measurements: Height: 5\' 5"  (165.1 cm) Weight: 131 lb (59.4 kg) IBW/kg (Calculated) : 57 Adjusted Body Weight:   Vital Signs: Temp: 98.5 F (36.9 C) (01/28 0825) Temp Source: Oral (01/28 0825) BP: 148/76 (01/28 0939) Pulse Rate: 84 (01/28 0825) Intake/Output from previous day: 01/27 0701 - 01/28 0700 In: 495 [I.V.:245; IV Piggyback:250] Out: -  Intake/Output from this shift: Total I/O In: 3 [I.V.:3] Out: -   Labs: Recent Labs    12/24/17 2123 12/26/17 0938 12/27/17 0533  WBC  --  13.4* 12.4*  HGB 8.2* 9.6* 8.9*  PLT  --  550* 600*  CREATININE  --  1.04* 0.97   Estimated Creatinine Clearance: 35.4 mL/min (by C-G formula based on SCr of 0.97 mg/dL). Recent Labs    12/27/17 0945  [place] 20     Microbiology: Recent Results (from the past 720 hour(s))  Blood Culture (routine x 2)     Status: None (Preliminary result)   Collection Time: 12/23/17  3:31 PM  Result Value Ref Range Status   Specimen Description   Final    BLOOD Blood Culture results may not be optimal due to an inadequate volume of blood received in culture bottles   Special Requests BLOOD RIGHT FOREARM  Final   Culture   Final    NO GROWTH 4 DAYS Performed at [facility], [address], [place], [place] [address]    Report Status PENDING  Incomplete  Blood Culture (routine x 2)     Status: None (Preliminary result)   Collection Time: 12/23/17  3:32 PM  Result Value Ref Range Status   Specimen Description BLOOD Blood Culture adequate volume  Final   Special Requests LEFT ANTECUBITAL  Final   Culture   Final    NO GROWTH 4 DAYS Performed at [facility], [address], [place], [place] [address]    Report Status PENDING  Incomplete  Surgical pcr screen     Status: Abnormal   Collection Time: 12/23/17  6:36 PM  Result Value Ref Range Status    MRSA, PCR POSITIVE (A) NEGATIVE Final    Comment: RESULT CALLED TO, READ BACK BY AND VERIFIED WITH: THERESA COBLE ON 12/23/17 AT 2200 JAG    Staphylococcus aureus POSITIVE (A) NEGATIVE Final    Comment: (NOTE) The Xpert SA Assay (FDA approved for NASAL specimens in patients 32 years of age and older), is one component of a comprehensive surveillance program. It is not intended to diagnose infection nor to guide or monitor treatment. Performed at [facility], [address], [place], [place] [address]   Aerobic/Anaerobic Culture (surgical/deep wound)     Status: None (Preliminary result)   Collection Time: 12/23/17 10:47 PM  Result Value Ref Range Status   Specimen Description   Final    TOE LEFT GREAT TOE ULCER Performed at [facility], [address], [place], [place] [address]    Special Requests   Final    NONE Performed at [facility], [address] [place], [place] [address]    Gram Stain   Final    FEW WBC PRESENT, PREDOMINANTLY PMN ABUNDANT GRAM POSITIVE COCCI IN CHAINS RARE GRAM NEGATIVE RODS    Culture   Final    MODERATE GROUP B STREP(S.AGALACTIAE)ISOLATED TESTING AGAINST S. AGALACTIAE NOT ROUTINELY PERFORMED DUE TO PREDICTABILITY OF AMP/PEN/VAN SUSCEPTIBILITY. [place]  TO FOLLOW HOLDING FOR POSSIBLE ANAEROBE Performed at [facility], [place] [address], [place], [place] [address]    Report Status PENDING  Incomplete    Medical History: Past Medical History:  Diagnosis Date  . Anemia    family unsure of this history  . Arthritis   . Chronic ulcer of calf (HCC)    Left  . Constipation   . Diabetes mellitus without complication ([place])   . Dyslipidemia   . GERD (gastroesophageal reflux disease)   . Hypertension   . Peripheral vascular disease (HCC)     Medications:  Infusions:  . sodium chloride    . sodium chloride    . sodium chloride 50 mL/hr at 12/26/17 2331  .  piperacillin-tazobactam (ZOSYN)  IV Stopped (12/27/17 0942)  . vancomycin Stopped (12/27/17 1032)   Assessment: 84 yof cc toe pain with PMH HTN, DM, PVD. Here from wound care after being referred for necrotic left great toe. Pharmacy consulted to dose Zosyn and vancomycin for sepsis/wound infection.  BCx x2 NGTD WCx mod GBS, few staph aureus - sens pending  Goal of Therapy:  Vancomycin trough level 15-20 mcg/ml  Plan:  1. Continue Zosyn 3.375 gm IV Q8H EI 2. Continue vancomycin 750 mg iv q 24 hours. Trough scheduled with 4th dose. Goal trough 15-20 mcg/ml.   1/28 0945 vanc trough = 20 (expected trough ~15), appears that dose was hung a little bit before trough was collected. Unclear if that affected trough result. As dose today already given, will draw another trough before dose tomorrow to confirm (will order it 1h before dose). Will empirically lower dose to vancomycin 500 mg IV q24h as dose and trough done minutes apart, level today drawn from opposite arm, and patient age. Will need to follow up cultures. Renal function stable.   Rocky Morel, Pharm.D., BCPS Clinical Pharmacist 12/27/2017,11:47 AM

## 2017-12-27 NOTE — Progress Notes (Signed)
[facility] NAME: [name]    MR#:  [id number]  DATE OF BIRTH:  1925-12-23  SUBJECTIVE:  S/p left toe surgery No new complaints  REVIEW OF SYSTEMS:   Review of Systems  Constitutional: Negative for chills, fever and weight loss.  HENT: Negative for ear discharge, ear pain and nosebleeds.   Eyes: Negative for blurred vision, pain and discharge.  Respiratory: Negative for sputum production, shortness of breath, wheezing and stridor.   Cardiovascular: Negative for chest pain, palpitations, orthopnea and PND.  Gastrointestinal: Negative for abdominal pain, diarrhea, nausea and vomiting.  Genitourinary: Negative for frequency and urgency.  Musculoskeletal: Negative for back pain and joint pain.  Neurological: Positive for weakness. Negative for sensory change, speech change and focal weakness.  Psychiatric/Behavioral: Negative for depression and hallucinations. The patient is not nervous/anxious.    Tolerating Diet:yes Tolerating PT:  home health PT  DRUG ALLERGIES:  No Known Allergies  VITALS:  Blood pressure (!) 148/76, pulse 84, temperature 98.5 F (36.9 C), temperature source Oral, resp. rate 18, height 5\' 5"  (1.651 m), weight 59.4 kg (131 lb), SpO2 99 %.  PHYSICAL EXAMINATION:   Physical Exam  GENERAL:  84 y.o.-year-old patient lying in the bed with no acute distress.  EYES: Pupils equal, round, reactive to light and accommodation. No scleral icterus. Extraocular muscles intact.  HEENT: Head atraumatic, normocephalic. Oropharynx and nasopharynx clear.  NECK:  Supple, no jugular venous distention. No thyroid enlargement, no tenderness.  LUNGS: Normal breath sounds bilaterally, no wheezing, rales, rhonchi. No use of accessory muscles of respiration.  CARDIOVASCULAR: S1, S2 normal. No murmurs, rubs, or gallops.  ABDOMEN: Soft, nontender, nondistended. Bowel sounds present. No organomegaly or mass.   EXTREMITIES: No cyanosis, clubbing or edema b/l.   Left foot dressing+ NEUROLOGIC: Cranial nerves II through XII are intact. No focal Motor or sensory deficits b/l.   PSYCHIATRIC:  patient is alert and oriented x 3.  SKIN: No obvious rash, lesion, or ulcer.   LABORATORY PANEL:  CBC Recent Labs  Lab 12/27/17 0533  WBC 12.4*  HGB 8.9*  HCT 27.6*  PLT 600*    Chemistries  Recent Labs  Lab 12/27/17 0533  NA 139  K 3.0*  CL 102  CO2 26  GLUCOSE 124*  BUN 12  CREATININE 0.97  CALCIUM 8.5*  AST 16  ALT 9*  ALKPHOS 51  BILITOT 0.6   Cardiac Enzymes No results for input(s): TROPONINI in the last 168 hours. RADIOLOGY:  No results found. ASSESSMENT AND PLAN:   Deborahann Poteat  is a 84 y.o. female with a known history of renal artery disease, hypertension, Dm-2,hyperlipidemia sent from wound care because of necrotic left great toe.  Noticed that the left great toe became black since 2 days   1. sepsis present on admission with elevated lactic acid, elevated white count secondary to left great toe cellulitis, gas gangrene - Continue vancomycin, Zosyn----consider de-escalating antibiotics -POD # 4 left 2nd toe amputation by dr cline -Bc negative WC Gpc and rare GNR -Per Dr. Cleda Mccreedy patient will likely need some more debridement on the left foot. He will schedule it after the angiogram is completed today.  2.  Left great toe gangrene: s/p amputation  3.  History of peripheral artery disease -pt is s/p left leg angiogram, stent placement in September -dr schnier plans angiogram  Today  -will resume plavix after angio on monday  4.acute on chronic anemia -hgb down to  7.1--1 unit BT --8.2  5. DM-2 Cont ssi Will resume metformin 48 hours after angiogram  PT  recommends home health PT  D/w family   Case discussed with Care Management/Social Worker. Management plans discussed with the patient, family and they are in agreement.  CODE STATUS: Full  DVT Prophylaxis:  lovenox  TOTAL TIME TAKING CARE OF THIS PATIENT:30 minutes.  >50% time spent on counselling and coordination of care  POSSIBLE D/C IN2-3DAYS, DEPENDING ON CLINICAL CONDITION.  Note: This dictation was prepared with Dragon dictation along with smaller phrase technology. Any transcriptional errors that result from this process are unintentional.  Fritzi Mandes M.D on 12/27/2017 at 12:06 PM  Between 7am to 6pm - Pager - [phone]  After 6pm go to [email or web address] - password [email or web address] [facility]  Office  [phone]  CC: Primary care physician; Lucianne Lei, MDPatient ID: [name], female   DOB: 1926/02/19, 84 y.o.   MRN: [id number]

## 2017-12-27 NOTE — H&P (Signed)
[facility] History & Physical Update  The patient was interviewed and re-examined.  The patient's previous History and Physical has been reviewed and is unchanged.  There is no change in the plan of care. We plan to proceed with the scheduled procedure.  Leotis Pain, MD  12/27/2017, 2:49 PM

## 2017-12-28 ENCOUNTER — Ambulatory Visit: Payer: Medicare Other | Admitting: Podiatry

## 2017-12-28 ENCOUNTER — Encounter: Payer: Self-pay | Admitting: Vascular Surgery

## 2017-12-28 LAB — CULTURE, BLOOD (ROUTINE X 2)
CULTURE: NO GROWTH
Culture: NO GROWTH
SPECIMEN DESCRIPTION: ADEQUATE

## 2017-12-28 LAB — VANCOMYCIN, TROUGH: VANCOMYCIN TR: 14 ug/mL — AB (ref 15–20)

## 2017-12-28 LAB — GLUCOSE, CAPILLARY: GLUCOSE-CAPILLARY: 121 mg/dL — AB (ref 65–99)

## 2017-12-28 LAB — POTASSIUM: Potassium: 2.9 mmol/L — ABNORMAL LOW (ref 3.5–5.1)

## 2017-12-28 MED ORDER — DOXYCYCLINE HYCLATE 100 MG PO TABS: 100.0000 mg | ORAL_TABLET | Freq: Two times a day (BID) | ORAL | Status: DC

## 2017-12-28 MED ORDER — OXYCODONE-ACETAMINOPHEN 5-325 MG PO TABS: 1.0000 | ORAL_TABLET | Freq: Four times a day (QID) | ORAL | 0 refills | Status: DC | PRN

## 2017-12-28 MED ORDER — DOXYCYCLINE HYCLATE 100 MG PO TABS: 100.0000 mg | ORAL_TABLET | Freq: Two times a day (BID) | ORAL | 0 refills | Status: DC

## 2017-12-28 MED ORDER — AMOXICILLIN-POT CLAVULANATE 875-125 MG PO TABS: 1.0000 | ORAL_TABLET | Freq: Two times a day (BID) | ORAL | 0 refills | Status: DC

## 2017-12-28 MED ORDER — POTASSIUM CHLORIDE CRYS ER 20 MEQ PO TBCR
60.0000 meq | EXTENDED_RELEASE_TABLET | Freq: Once | ORAL | Status: AC
  Administered 2017-12-28: 60 meq via ORAL
  Filled 2017-12-28: qty 3

## 2017-12-28 MED ORDER — AMOXICILLIN-POT CLAVULANATE 875-125 MG PO TABS
1.0000 | ORAL_TABLET | Freq: Two times a day (BID) | ORAL | Status: DC
  Administered 2017-12-28: 1 via ORAL
  Filled 2017-12-28: qty 1

## 2017-12-28 NOTE — Progress Notes (Signed)
Patient is being discharged home with granddaughter. IV removed with cath intact. Reviewed meds, scripts, and last dose given. ABX teaching. Follow-up appointments. Oral Potassium given. Tolerated well.

## 2017-12-28 NOTE — Discharge Summary (Signed)
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 Anemia    family unsure of this history  . Arthritis   . Chronic ulcer of calf (HCC)    Left  . Constipation   . Diabetes mellitus without complication ([place])   . Dyslipidemia   . GERD (gastroesophageal reflux disease)   . Hypertension   . Peripheral vascular disease [facility])     HOSPITAL COURSE:  [name], hypertension, Dm-2,hyperlipidemia sent from wound care because of necrotic left great [name]. Noticed that the left great [name] became black since 2 days   1.sepsis present on admission with elevated lactic acid, elevated white count secondary to left great [name] cellulitis and gas gangrene - Continue vancomycin, Zosyn---- change to po augmentin -WC growing strep agalactaie -POD # 5 left 2nd [name] amputation by dr cline -Bc negative WC Gpc and rare GNR -Per Dr. Cleda Mccreedy observe wound after revascularization as out pt  2. Left great [name] gangrene: s/p amputation  3. History of peripheral artery disease -pt is s/p left leg angiogram, stent placement in September - s/p angiogram and stent placement in above knee popliteal artery, mid superior femoral artery and angioplasty of left peroneal, left popliteal and mid anddistal SFA  4.acute on chronic  anemia -hgb down to 7.1--1 unit BT --8.2  5. DM-2 Cont ssi Will resume metformin at d/c  PT  recommends home health PT  D/w family D/c home today after seen Dr cline  CONSULTS OBTAINED:  Treatment Team:  [name], DPM [name], Alexa Lory, MD  DRUG ALLERGIES:  No Known Allergies  DISCHARGE MEDICATIONS:   Allergies as of 12/28/2017   No Known Allergies     Medication List    TAKE these medications   amLODipine 10 MG tablet Commonly known as:  NORVASC Take 10 mg by mouth daily.   amoxicillin-clavulanate 875-125 MG tablet Commonly known as:  AUGMENTIN Take 1 tablet by mouth every 12 (twelve) hours.   aspirin EC 81 MG tablet Take 81 mg by mouth daily.   atorvastatin 10 MG tablet Commonly known as:  LIPITOR Take 10 mg by mouth daily.   CAL-MAG-ZINC PO Take 1 tablet by mouth daily.   carvedilol 6.25 MG tablet Commonly known as:  COREG Take 6.25 mg by mouth at bedtime.   clopidogrel 75 MG tablet Commonly known as:  PLAVIX Take 1 tablet (75 mg total) by mouth daily with breakfast.   metFORMIN 1000 MG tablet Commonly known as:  GLUCOPHAGE Take 1,000 mg by mouth 2 (two) times daily with a meal.   oxyCODONE-acetaminophen 5-325 MG tablet Commonly known as:  PERCOCET/ROXICET Take 1-2 tablets by mouth every 6 (six) hours as needed for severe pain.   pantoprazole 20 MG tablet Commonly known as:  PROTONIX Take 1 tablet (20 mg total) by mouth daily.   polyethylene glycol packet Commonly known as:  MIRALAX / GLYCOLAX Take 17 g by mouth daily as needed (for constipation.).   SANTYL ointment Generic drug:  collagenase Apply 1 application topically daily. Applied to wound of leg and dress wound   traMADol 50 MG tablet Commonly known as:  ULTRAM Take 1 tablet (50 mg total) by mouth every 6 (six) hours as needed for moderate pain.   valsartan-hydrochlorothiazide 320-25 MG tablet Commonly known as:  DIOVAN-HCT Take 1 tablet by mouth daily.   VITAMIN D3  GUMMIES ADULT PO Take 1 tablet by mouth daily.       If you experience worsening of your admission symptoms, develop shortness of breath, life threatening emergency, suicidal or homicidal thoughts you must seek medical attention immediately by calling 911 or calling your MD immediately  if symptoms less severe.  You Must read complete instructions/literature along with all the possible adverse reactions/side effects for all the Medicines you take and that have been prescribed to you. Take any new Medicines after you have completely understood and accept all the possible adverse reactions/side effects.   Please note  You were cared for by a hospitalist during your hospital stay. If you have any questions about your discharge medications or the care you received while you were in the hospital after you are discharged, you can call the unit and asked to speak with the hospitalist on call if the hospitalist that took care of you is not available. Once you are discharged, your primary care physician will handle any further medical issues. Please note that NO REFILLS for any discharge medications will be authorized once you are discharged, as it is imperative that you return to your primary care physician (or establish a relationship with a primary care physician if you do not have one) for your aftercare needs so that they can reassess your need for medications and monitor your lab values. Today   SUBJECTIVE   Doing well  VITAL SIGNS:  Blood pressure (!) 147/91, pulse 88, temperature 98 F (36.7 C), temperature source Oral, resp. rate 20, height 5\' 5"  (1.651 m), weight 52.2 kg (115 lb), SpO2 100 %.  I/O:    Intake/Output Summary (Last 24 hours) at 12/28/2017 0758 Last data filed at 12/28/2017 0424 Gross per 24 hour  Intake 3 ml  Output 300 ml  Net -297 ml    PHYSICAL EXAMINATION:  GENERAL:  84 y.o.-year-old patient lying in the bed with no acute distress.  EYES: Pupils equal, round,  reactive to light and accommodation. No scleral icterus. Extraocular muscles intact.  HEENT: Head atraumatic, normocephalic. Oropharynx and nasopharynx clear.  NECK:  Supple, no jugular venous distention. No thyroid enlargement, no tenderness.  LUNGS: Normal breath sounds bilaterally, no wheezing, rales,rhonchi or crepitation. No use of accessory muscles of respiration.  CARDIOVASCULAR: S1, S2 normal. No murmurs, rubs, or gallops.  ABDOMEN: Soft, non-tender, non-distended. Bowel sounds present. No organomegaly or mass.  EXTREMITIES: left foot surgical dressing+ NEUROLOGIC: Cranial nerves II through XII are intact. Muscle strength 5/5 in all extremities. Sensation intact. Gait not checked.  PSYCHIATRIC: The patient is alert and oriented x 3.  SKIN: No obvious rash, lesion, or ulcer.   DATA REVIEW:   CBC  Recent Labs  Lab 12/27/17 0533  WBC 12.4*  HGB 8.9*  HCT 27.6*  PLT 600*    Chemistries  Recent Labs  Lab 12/27/17 0533  NA 139  K 3.0*  CL 102  CO2 26  GLUCOSE 124*  BUN 12  CREATININE 0.97  CALCIUM 8.5*  MG 1.6*  AST 16  ALT 9*  ALKPHOS 51  BILITOT 0.6    Microbiology Results   Recent Results (from the past 240 hour(s))  Blood Culture (routine x 2)     Status: None   Collection Time: 12/23/17  3:31 PM  Result Value Ref Range Status   Specimen Description   Final    BLOOD Blood Culture results may not be optimal due to an inadequate volume of blood received in culture bottles   Special Requests BLOOD RIGHT FOREARM  Final   Culture   Final    NO GROWTH 5 DAYS Performed at [facility], [address] [place], [place] [address]    Report Status 12/28/2017 FINAL  Final  Blood Culture (routine x 2)     Status: None   Collection Time: 12/23/17  3:32 PM  Result Value Ref Range Status   Specimen Description BLOOD Blood Culture adequate volume  Final   Special Requests LEFT ANTECUBITAL  Final   Culture   Final    NO GROWTH 5 DAYS Performed at [facility], [address] [place], [place] [address]    Report Status 12/28/2017 FINAL  Final  Surgical pcr screen     Status: Abnormal   Collection Time: 12/23/17  6:36 PM  Result Value Ref Range Status   MRSA, PCR POSITIVE (A) NEGATIVE Final    Comment: RESULT CALLED TO, READ BACK BY AND VERIFIED WITH: THERESA COBLE ON 12/23/17 AT 2200 JAG    Staphylococcus aureus POSITIVE (A) NEGATIVE Final    Comment: (NOTE) The Xpert SA Assay (FDA approved for NASAL specimens in patients 17 years of age and older), is one component of a comprehensive surveillance program. It is not intended to diagnose infection nor to guide or monitor treatment. Performed at [facility], [address] [place], [place] [address]   Aerobic/Anaerobic Culture (surgical/deep wound)     Status: None (Preliminary result)   Collection Time: 12/23/17 10:47 PM  Result Value Ref Range Status   Specimen Description   Final    [name] LEFT GREAT [name] ULCER Performed at [facility], [address], [place], [place] [address]    Special Requests   Final    NONE Performed at [facility], [address] [place], [place] [address]    Gram Stain   Final    FEW WBC PRESENT, PREDOMINANTLY PMN ABUNDANT GRAM POSITIVE COCCI IN CHAINS RARE GRAM NEGATIVE RODS    Culture   Final    MODERATE GROUP B STREP(S.AGALACTIAE)ISOLATED FEW STAPHYLOCOCCUS AUREUS HOLDING FOR POSSIBLE ANAEROBE TESTING AGAINST S. AGALACTIAE NOT ROUTINELY PERFORMED DUE TO PREDICTABILITY OF AMP/PEN/VAN SUSCEPTIBILITY. Performed at [facility], [place] [address], [place], [place] [address]    Report Status PENDING  Incomplete    RADIOLOGY:  No results found.   Management plans discussed with the patient, family and they are in agreement.  CODE STATUS:     Code Status Orders  (From admission, onward)        Start     Ordered   12/24/17 0021  Full code  Continuous     12/24/17 0020    Code Status History     Date Active Date Inactive Code Status Order ID Comments User Context   12/23/2017 15:42 12/24/2017 00:20 Full Code [id number]  Epifanio Lesches, MD ED   09/08/2017 12:26 09/10/2017 16:35 Full Code [id number]  Algernon Huxley, MD Inpatient   08/16/2017 14:52 08/16/2017  19:20 Full Code [id number]  Algernon Huxley, MD Inpatient   07/22/2017 11:07 07/22/2017 16:10 Full Code [id number]  Algernon Huxley, MD Inpatient   12/31/2013 16:23 01/01/2014 19:23 Full Code [id number]  Murlean Iba, MD Inpatient    Advance Directive Documentation     Most Recent Value  Type of Advance Directive  Healthcare Power of Attorney, Living will  Pre-existing out of facility DNR order (yellow form or pink MOST form)  No data  "MOST" Form in Place?  No data      TOTAL TIME TAKING CARE OF THIS PATIENT: *40* minutes.    Fritzi Mandes M.D on 12/28/2017 at 7:58 AM  Between 7am to 6pm - Pager - [phone] After 6pm go to [email or web address] - password [email or web address] [facility]  Office  [phone]  CC: Primary care physician; Lucianne Lei, MD

## 2017-12-28 NOTE — Discharge Instructions (Signed)
BMP to be checked with PCP on 01/10/18

## 2017-12-28 NOTE — Progress Notes (Signed)
1 Day Post-Op   Subjective/Chief Complaint: Patient seen.  Doing well.  No complaints of pain.   Objective: Vital signs in last 24 hours: Temp:  [98 F (36.7 C)-98.5 F (36.9 C)] 98 F (36.7 C) (01/29 0420) Pulse Rate:  [81-89] 88 (01/29 0420) Resp:  [14-21] 20 (01/29 0420) BP: (136-163)/(65-91) 147/91 (01/29 0420) SpO2:  [92 %-100 %] 100 % (01/29 0420) Weight:  [52.2 kg (115 lb)] 52.2 kg (115 lb) (01/29 0500) Last BM Date: 12/26/17  Intake/Output from previous day: 01/28 0701 - 01/29 0700 In: 3 [I.V.:3] Out: 300 [Urine:300] Intake/Output this shift: No intake/output data recorded.  Still some mild bleeding and drainage on the bandage.  Upon removal the medial aspect of the incision still appears well coapted.  No expressible drainage today.  Lab Results:  Recent Labs    12/26/17 0938 12/27/17 0533  WBC 13.4* 12.4*  HGB 9.6* 8.9*  HCT 29.4* 27.6*  PLT 550* 600*   BMET Recent Labs    12/26/17 0938 12/27/17 0533  NA 138 139  K 3.2* 3.0*  CL 103 102  CO2 27 26  GLUCOSE 120* 124*  BUN 12 12  CREATININE 1.04* 0.97  CALCIUM 8.8* 8.5*   PT/INR No results for input(s): LABPROT, INR in the last 72 hours. ABG No results for input(s): PHART, HCO3 in the last 72 hours.  Invalid input(s): PCO2, PO2  Studies/Results: No results found.  Anti-infectives: Anti-infectives (From admission, onward)   Start     Dose/Rate Route Frequency Ordered Stop   12/28/17 1000  vancomycin (VANCOCIN) 500 mg in sodium chloride 0.9 % 100 mL IVPB  Status:  Discontinued     500 mg 100 mL/hr over 60 Minutes Intravenous Every 24 hours 12/27/17 1157 12/28/17 0742   12/28/17 1000  amoxicillin-clavulanate (AUGMENTIN) 875-125 MG per tablet 1 tablet     1 tablet Oral Every 12 hours 12/28/17 0743     12/28/17 0000  amoxicillin-clavulanate (AUGMENTIN) 875-125 MG tablet     1 tablet Oral Every 12 hours 12/28/17 0757     12/27/17 1645  ceFAZolin (ANCEF) IVPB 2g/100 mL premix  Status:   Discontinued     2 g 200 mL/hr over 30 Minutes Intravenous  Once 12/27/17 1635 12/27/17 1726   12/27/17 1457  ceFAZolin (ANCEF) 2-4 GM/100ML-% IVPB    Comments:  Despina Arias  : cabinet override      12/27/17 1457 12/27/17 1505   12/24/17 1000  vancomycin (VANCOCIN) IVPB 750 mg/150 ml premix  Status:  Discontinued     750 mg 150 mL/hr over 60 Minutes Intravenous Every 24 hours 12/23/17 1534 12/27/17 1157   12/23/17 2200  piperacillin-tazobactam (ZOSYN) IVPB 3.375 g  Status:  Discontinued     3.375 g 12.5 mL/hr over 240 Minutes Intravenous Every 8 hours 12/23/17 1534 12/28/17 0743   12/23/17 1530  piperacillin-tazobactam (ZOSYN) IVPB 3.375 g     3.375 g 100 mL/hr over 30 Minutes Intravenous  Once 12/23/17 1522 12/23/17 1621   12/23/17 1530  vancomycin (VANCOCIN) IVPB 1000 mg/200 mL premix     1,000 mg 200 mL/hr over 60 Minutes Intravenous  Once 12/23/17 1522 12/23/17 1721      Assessment/Plan: s/p Procedure(s): ABDOMINAL AORTOGRAM W/LOWER EXTREMITY (Left) Assessment: Stable status post amputation left great toe.   Plan: Betadine and a sterile bandage reapplied to the left foot.  Patient should be stable for discharge on oral antibiotics and home health care.  Follow-up in 1 week.  LOS: 5 days  Durward Fortes 12/28/2017

## 2017-12-28 NOTE — Care Management Note (Signed)
Case Management Note  Patient Details  Name: [name] MRN: [id number] Date of Birth: [birth year]  Subjective/Objective:  Spoke with patient's granddaughter, [name]. Discussed discharge plan. She states patient was active with [facility]. Notified her that patient will resume care with [facility] with nursing, dressing changes and PT. She is agreeable. Corene Cornea with [facility] notified of orders and discharge. Patient has a walker. No other DME needed.                   Action/Plan: [facility] for RN and PT  Expected Discharge Date:  12/28/17               Expected Discharge Plan:  [place]  In-House Referral:     Discharge planning Services  CM Consult  Post Acute Care Choice:  Home Health, Resumption of Svcs/PTA Provider Choice offered to:  [facility] POA / Guardian  DME Arranged:    DME Agency:     HH Arranged:  RN, PT [place] Agency:  [facility]  Status of Service:  Completed, signed off  If discussed at [place] of Stay Meetings, dates discussed:    Additional Comments:  Jolly Mango, RN 12/28/2017, 9:03 AM

## 2017-12-28 NOTE — Progress Notes (Signed)
Pt alert and oriented. No complainst of pain. Iv infusing without difficulty. Small amount of drainage to angiogram dressing. Surgical dressing to foot dry and intact.

## 2017-12-28 NOTE — Discharge Summary (Signed)
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 Anemia    family unsure of this history  . Arthritis   . Chronic ulcer of calf (HCC)    Left  . Constipation   . Diabetes mellitus without complication ([place])   . Dyslipidemia   . GERD (gastroesophageal reflux disease)   . Hypertension   . Peripheral vascular disease [facility])     HOSPITAL COURSE:  [name], hypertension, Dm-2,hyperlipidemia sent from wound care because of necrotic left great toe. Noticed that the left great toe became black since 2 days   1.sepsis present on admission with elevated lactic acid, elevated white count secondary to left great toe cellulitis and gas gangrene - Continue vancomycin, Zosyn---- change to po augmentin and doxycycline -WC growing strep agalactaie -POD # 5 left 2nd toe amputation by dr cline -Bc negative WC Gpc and rare GNR -Per Dr. Cleda Mccreedy observe wound after revascularization as out pt  2. Left great toe gangrene: s/p amputation  3. History of peripheral artery disease -pt is s/p left leg angiogram, stent placement in September - s/p angiogram and stent placement in above knee popliteal artery, mid superior femoral artery and angioplasty of left peroneal, left popliteal and mid anddistal  SFA  4.acute on chronic anemia -hgb down to 7.1--1 unit BT --8.2  5. DM-2 Cont ssi Will resume metformin at d/c  6. Hypokalemia Replete with oral K Pt will f/u labs as outpt  PT  recommends home health PT  D/w family D/c home today after seen Dr cline  CONSULTS OBTAINED:  Treatment Team:  Sharlotte Alamo, DPM Schnier, Dolores Lory, MD  DRUG ALLERGIES:  No Known Allergies  DISCHARGE MEDICATIONS:   Allergies as of 12/28/2017   No Known Allergies     Medication List    TAKE these medications   amLODipine 10 MG tablet Commonly known as:  NORVASC Take 10 mg by mouth daily.   amoxicillin-clavulanate 875-125 MG tablet Commonly known as:  AUGMENTIN Take 1 tablet by mouth every 12 (twelve) hours.   aspirin EC 81 MG tablet Take 81 mg by mouth daily.   atorvastatin 10 MG tablet Commonly known as:  LIPITOR Take 10 mg by mouth daily.   CAL-MAG-ZINC PO Take 1 tablet by mouth daily.   carvedilol 6.25 MG tablet Commonly known as:  COREG Take 6.25 mg by mouth at bedtime.   clopidogrel 75 MG tablet Commonly known as:  PLAVIX Take 1 tablet (75 mg total) by mouth daily with breakfast.   doxycycline 100 MG tablet Commonly known as:  VIBRA-TABS Take 1 tablet (100 mg total) by mouth every 12 (twelve) hours.   metFORMIN 1000 MG tablet Commonly known as:  GLUCOPHAGE Take 1,000 mg by mouth 2 (two) times daily with a meal.   oxyCODONE-acetaminophen 5-325 MG tablet Commonly known as:  PERCOCET/ROXICET Take 1-2 tablets by mouth  every 6 (six) hours as needed for severe pain.   pantoprazole 20 MG tablet Commonly known as:  PROTONIX Take 1 tablet (20 mg total) by mouth daily.   polyethylene glycol packet Commonly known as:  MIRALAX / GLYCOLAX Take 17 g by mouth daily as needed (for constipation.).   SANTYL ointment Generic drug:  collagenase Apply 1 application topically daily. Applied to wound of leg and dress wound   traMADol 50 MG tablet Commonly known as:   ULTRAM Take 1 tablet (50 mg total) by mouth every 6 (six) hours as needed for moderate pain.   valsartan-hydrochlorothiazide 320-25 MG tablet Commonly known as:  DIOVAN-HCT Take 1 tablet by mouth daily.   VITAMIN D3 GUMMIES ADULT PO Take 1 tablet by mouth daily.       If you experience worsening of your admission symptoms, develop shortness of breath, life threatening emergency, suicidal or homicidal thoughts you must seek medical attention immediately by calling 911 or calling your MD immediately  if symptoms less severe.  You Must read complete instructions/literature along with all the possible adverse reactions/side effects for all the Medicines you take and that have been prescribed to you. Take any new Medicines after you have completely understood and accept all the possible adverse reactions/side effects.   Please note  You were cared for by a hospitalist during your hospital stay. If you have any questions about your discharge medications or the care you received while you were in the hospital after you are discharged, you can call the unit and asked to speak with the hospitalist on call if the hospitalist that took care of you is not available. Once you are discharged, your primary care physician will handle any further medical issues. Please note that NO REFILLS for any discharge medications will be authorized once you are discharged, as it is imperative that you return to your primary care physician (or establish a relationship with a primary care physician if you do not have one) for your aftercare needs so that they can reassess your need for medications and monitor your lab values. Today   SUBJECTIVE   Doing well  VITAL SIGNS:  Blood pressure (!) 158/72, pulse 92, temperature 98.9 F (37.2 C), temperature source Oral, resp. rate 20, height 5\' 5"  (1.651 m), weight 52.2 kg (115 lb), SpO2 98 %.  I/O:    Intake/Output Summary (Last 24 hours) at 12/28/2017 0959 Last data  filed at 12/28/2017 0424 Gross per 24 hour  Intake 0 ml  Output 300 ml  Net -300 ml    PHYSICAL EXAMINATION:  GENERAL:  84 y.o.-year-old patient lying in the bed with no acute distress.  EYES: Pupils equal, round, reactive to light and accommodation. No scleral icterus. Extraocular muscles intact.  HEENT: Head atraumatic, normocephalic. Oropharynx and nasopharynx clear.  NECK:  Supple, no jugular venous distention. No thyroid enlargement, no tenderness.  LUNGS: Normal breath sounds bilaterally, no wheezing, rales,rhonchi or crepitation. No use of accessory muscles of respiration.  CARDIOVASCULAR: S1, S2 normal. No murmurs, rubs, or gallops.  ABDOMEN: Soft, non-tender, non-distended. Bowel sounds present. No organomegaly or mass.  EXTREMITIES: left foot surgical dressing+ NEUROLOGIC: Cranial nerves II through XII are intact. Muscle strength 5/5 in all extremities. Sensation intact. Gait not checked.  PSYCHIATRIC: The patient is alert and oriented x 3.  SKIN: No obvious rash, lesion, or ulcer.   DATA REVIEW:   CBC  Recent Labs  Lab 12/27/17 0533  WBC 12.4*  HGB 8.9*  HCT 27.6*  PLT 600*    Chemistries  Recent Labs  Lab 12/27/17 0533 12/28/17 0847  NA 139  --   K 3.0* 2.9*  CL 102  --   CO2 26  --   GLUCOSE 124*  --   BUN 12  --   CREATININE 0.97  --   CALCIUM 8.5*  --   MG 1.6*  --   AST 16  --   ALT 9*  --   ALKPHOS 51  --   BILITOT 0.6  --     Microbiology Results   Recent Results (from the past 240 hour(s))  Blood Culture (routine x 2)     Status: None   Collection Time: 12/23/17  3:31 PM  Result Value Ref Range Status   Specimen Description   Final    BLOOD Blood Culture results may not be optimal due to an inadequate volume of blood received in culture bottles   Special Requests BLOOD RIGHT FOREARM  Final   Culture   Final    NO GROWTH 5 DAYS Performed at [facility], [address] [place], [place] [address]    Report Status 12/28/2017  FINAL  Final  Blood Culture (routine x 2)     Status: None   Collection Time: 12/23/17  3:32 PM  Result Value Ref Range Status   Specimen Description BLOOD Blood Culture adequate volume  Final   Special Requests LEFT ANTECUBITAL  Final   Culture   Final    NO GROWTH 5 DAYS Performed at [facility], [address] [place], [place] [address]    Report Status 12/28/2017 FINAL  Final  Surgical pcr screen     Status: Abnormal   Collection Time: 12/23/17  6:36 PM  Result Value Ref Range Status   MRSA, PCR POSITIVE (A) NEGATIVE Final    Comment: RESULT CALLED TO, READ BACK BY AND VERIFIED WITH: THERESA COBLE ON 12/23/17 AT 2200 JAG    Staphylococcus aureus POSITIVE (A) NEGATIVE Final    Comment: (NOTE) The Xpert SA Assay (FDA approved for NASAL specimens in patients 10 years of age and older), is one component of a comprehensive surveillance program. It is not intended to diagnose infection nor to guide or monitor treatment. Performed at [facility], [address] [place], [place] [address]   Aerobic/Anaerobic Culture (surgical/deep wound)     Status: None (Preliminary result)   Collection Time: 12/23/17 10:47 PM  Result Value Ref Range Status   Specimen Description   Final    TOE LEFT GREAT TOE ULCER Performed at [facility], [address] [place], [place] [address]    Special Requests   Final    NONE Performed at [facility], [address] [place], [place] [address]    Gram Stain   Final    FEW WBC PRESENT, PREDOMINANTLY PMN ABUNDANT GRAM POSITIVE COCCI IN CHAINS RARE GRAM NEGATIVE RODS    Culture   Final    MODERATE GROUP B STREP(S.AGALACTIAE)ISOLATED FEW STAPHYLOCOCCUS AUREUS HOLDING FOR POSSIBLE ANAEROBE TESTING AGAINST S. AGALACTIAE NOT ROUTINELY PERFORMED DUE TO PREDICTABILITY OF AMP/PEN/VAN SUSCEPTIBILITY. Performed at [facility], [place] [address], [place],  [address]    Report Status PENDING   Incomplete    RADIOLOGY:  No results found.   Management plans discussed with the patient, family and they are in agreement.  CODE STATUS:     Code Status Orders  (From admission, onward)  Start     Ordered   12/24/17 0021  Full code  Continuous     12/24/17 0020    Code Status History    Date Active Date Inactive Code Status Order ID Comments User Context   12/23/2017 15:42 12/24/2017 00:20 Full Code [id number]  Epifanio Lesches, MD ED   09/08/2017 12:26 09/10/2017 16:35 Full Code [id number]  Algernon Huxley, MD Inpatient   08/16/2017 14:52 08/16/2017 19:20 Full Code [id number]  Algernon Huxley, MD Inpatient   07/22/2017 11:07 07/22/2017 16:10 Full Code [id number]  Algernon Huxley, MD Inpatient   12/31/2013 16:23 01/01/2014 19:23 Full Code [id number]  Murlean Iba, MD Inpatient    Advance Directive Documentation     Most Recent Value  Type of Advance Directive  Healthcare Power of [place], Living will  Pre-existing out of facility DNR order (yellow form or pink MOST form)  No data  "MOST" Form in Place?  No data      TOTAL TIME TAKING CARE OF THIS PATIENT: *40* minutes.    Fritzi Mandes M.D on 12/28/2017 at 9:59 AM  Between 7am to 6pm - Pager - [phone] After 6pm go to [email or web address] - password [email or web address] [facility]  Office  [phone]  CC: Primary care physician; Lucianne Lei, MD

## 2017-12-29 LAB — AEROBIC/ANAEROBIC CULTURE (SURGICAL/DEEP WOUND)

## 2017-12-29 LAB — AEROBIC/ANAEROBIC CULTURE W GRAM STAIN (SURGICAL/DEEP WOUND)

## 2017-12-31 ENCOUNTER — Encounter: Payer: Medicare Other | Attending: Physician Assistant | Admitting: Physician Assistant

## 2017-12-31 DIAGNOSIS — I10 Essential (primary) hypertension: Secondary | ICD-10-CM | POA: Diagnosis not present

## 2017-12-31 DIAGNOSIS — I70245 Atherosclerosis of native arteries of left leg with ulceration of other part of foot: Secondary | ICD-10-CM | POA: Insufficient documentation

## 2017-12-31 DIAGNOSIS — L97522 Non-pressure chronic ulcer of other part of left foot with fat layer exposed: Secondary | ICD-10-CM | POA: Diagnosis not present

## 2017-12-31 DIAGNOSIS — E11621 Type 2 diabetes mellitus with foot ulcer: Secondary | ICD-10-CM | POA: Insufficient documentation

## 2017-12-31 DIAGNOSIS — I70242 Atherosclerosis of native arteries of left leg with ulceration of calf: Secondary | ICD-10-CM | POA: Diagnosis not present

## 2017-12-31 DIAGNOSIS — L97824 Non-pressure chronic ulcer of other part of left lower leg with necrosis of bone: Secondary | ICD-10-CM | POA: Insufficient documentation

## 2017-12-31 DIAGNOSIS — E1151 Type 2 diabetes mellitus with diabetic peripheral angiopathy without gangrene: Secondary | ICD-10-CM | POA: Insufficient documentation

## 2017-12-31 DIAGNOSIS — E11622 Type 2 diabetes mellitus with other skin ulcer: Secondary | ICD-10-CM | POA: Diagnosis present

## 2018-01-02 NOTE — Progress Notes (Signed)
ROBERT, SUNGA ([id number]) Visit Report for 12/31/2017 Arrival Information Details Patient Name: [name], [name] Date of Service: 12/31/2017 3:15 PM Medical Record Number: [id number] Patient Account Number: [id number] Date of Birth/Sex: 1926-01-18 (84 y.o. Female) Treating RN: Carolyne Fiscal, Debi Primary Care Kyrstin Campillo: Lucianne Lei Other Clinician: Referring Teller Wakefield: Lucianne Lei Treating Anmol Paschen/Extender: Melburn Hake, [name] Weeks in Treatment: 87 Visit Information History Since Last Visit All ordered tests and consults were completed: No Patient Arrived: [name] Added or deleted any medications: No Arrival Time: 15:14 Any new allergies or adverse reactions: No Accompanied By: self Had a fall or experienced change in No Transfer Assistance: EasyPivot Patient activities of daily living that may affect Lift risk of falls: Patient Identification Verified: Yes Signs or symptoms of abuse/neglect since last visito No Secondary Verification Process Yes Hospitalized since last visit: No Completed: Has Dressing in Place as Prescribed: Yes Patient Requires Transmission-Based No Precautions: Pain Present Now: Yes Patient Has Alerts: Yes Electronic Signature(s) Signed: 12/31/2017 4:17:47 PM By: Alric Quan Entered By: Alric Quan on 12/31/2017 15:15:48 Noseworthy, [name] ([id number]) -------------------------------------------------------------------------------- Clinic Level of Care Assessment Details Patient Name: Alexa Lowes P. Date of Service: 12/31/2017 3:15 PM Medical Record Number: [id number] Patient Account Number: [id number] Date of Birth/Sex: [birth year] (84 y.o. Female) Treating RN: Carolyne Fiscal, Debi Primary Care Aundria Bitterman: Lucianne Lei Other Clinician: Referring Breannah Kratt: Lucianne Lei Treating Alysiana Ethridge/Extender: Melburn Hake, [name] Weeks in Treatment: 31 Clinic Level of Care Assessment Items TOOL 4 Quantity Score X - Use when only an EandM is performed on FOLLOW-UP  visit 1 0 ASSESSMENTS - Nursing Assessment / Reassessment X - Reassessment of Co-morbidities (includes updates in patient status) 1 10 X- 1 5 Reassessment of Adherence to Treatment Plan ASSESSMENTS - Wound and Skin Assessment / Reassessment X - Simple Wound Assessment / Reassessment - one wound 1 5 []  - 0 Complex Wound Assessment / Reassessment - multiple wounds []  - 0 Dermatologic / Skin Assessment (not related to wound area) ASSESSMENTS - Focused Assessment []  - Circumferential Edema Measurements - multi extremities 0 []  - 0 Nutritional Assessment / Counseling / Intervention []  - 0 Lower Extremity Assessment (monofilament, tuning fork, pulses) []  - 0 Peripheral Arterial Disease Assessment (using hand held doppler) ASSESSMENTS - Ostomy and/or Continence Assessment and Care []  - Incontinence Assessment and Management 0 []  - 0 Ostomy Care Assessment and Management (repouching, etc.) PROCESS - Coordination of Care []  - Simple Patient / Family Education for ongoing care 0 X- 1 20 Complex (extensive) Patient / Family Education for ongoing care X- 1 10 Staff obtains Programmer, systems, Records, Test Results / Process Orders X- 1 10 Staff telephones HHA, Nursing Homes / Clarify orders / etc []  - 0 Routine Transfer to another Facility (non-emergent condition) []  - 0 Routine Hospital Admission (non-emergent condition) []  - 0 New Admissions / Biomedical engineer / Ordering NPWT, Apligraf, etc. []  - 0 Emergency Hospital Admission (emergent condition) X- 1 10 Simple Discharge Coordination [name], [name] ([id number]) []  - 0 Complex (extensive) Discharge Coordination PROCESS - Special Needs []  - Pediatric / Minor Patient Management 0 []  - 0 Isolation Patient Management []  - 0 Hearing / Language / Visual special needs []  - 0 Assessment of Community assistance (transportation, D/C planning, etc.) []  - 0 Additional assistance / Altered mentation []  - 0 Support Surface(s)  Assessment (bed, cushion, seat, etc.) INTERVENTIONS - Wound Cleansing / Measurement X - Simple Wound Cleansing - one wound 1 5 []  - 0 Complex Wound Cleansing - multiple wounds X- 1 5 Wound Imaging (  photographs - any number of wounds) []  - 0 Wound Tracing (instead of photographs) X- 1 5 Simple Wound Measurement - one wound []  - 0 Complex Wound Measurement - multiple wounds INTERVENTIONS - Wound Dressings X - Small Wound Dressing one or multiple wounds 1 10 []  - 0 Medium Wound Dressing one or multiple wounds []  - 0 Large Wound Dressing one or multiple wounds X- 1 5 Application of Medications - topical []  - 0 Application of Medications - injection INTERVENTIONS - Miscellaneous []  - External ear exam 0 []  - 0 Specimen Collection (cultures, biopsies, blood, body fluids, etc.) []  - 0 Specimen(s) / Culture(s) sent or taken to Lab for analysis []  - 0 Patient Transfer (multiple staff / Civil Service fast streamer / Similar devices) []  - 0 Simple Staple / Suture removal (25 or less) []  - 0 Complex Staple / Suture removal (26 or more) []  - 0 Hypo / Hyperglycemic Management (close monitor of Blood Glucose) []  - 0 Ankle / Brachial Index (ABI) - do not check if billed separately X- 1 5 Vital Signs [name], [name] ([id number]) Has the patient been seen at the hospital within the last three years: Yes Total Score: 105 Level Of Care: New/Established - Level 3 Electronic Signature(s) Signed: 12/31/2017 4:17:47 PM By: Alric Quan Entered By: Alric Quan on 12/31/2017 16:12:35 Malkowski, [name] ([id number]) -------------------------------------------------------------------------------- Encounter Discharge Information Details Patient Name: Alexa Lowes P. Date of Service: 12/31/2017 3:15 PM Medical Record Number: [id number] Patient Account Number: [id number] Date of Birth/Sex: 30-Jan-1926 (84 y.o. Female) Treating RN: Carolyne Fiscal, Debi Primary Care Daniesha Driver: Lucianne Lei Other  Clinician: Referring Krishav Mamone: Lucianne Lei Treating Taletha Twiford/Extender: Melburn Hake, [name] Weeks in Treatment: 31 Encounter Discharge Information Items Discharge Pain Level: 0 Discharge Condition: Stable Ambulatory Status: [name] Discharge Destination: Home Transportation: Private Auto Accompanied By: son Schedule Follow-up Appointment: Yes Medication Reconciliation completed and No provided to Patient/Care Ashlei Chinchilla: Provided on Clinical Summary of Care: 12/31/2017 Form Type Recipient Paper Patient GW Electronic Signature(s) Signed: 12/31/2017 4:10:53 PM By: Alric Quan Entered By: Alric Quan on 12/31/2017 16:10:52 [name], [name] ([id number]) -------------------------------------------------------------------------------- Lower Extremity Assessment Details Patient Name: Alexa Lowes P. Date of Service: 12/31/2017 3:15 PM Medical Record Number: [id number] Patient Account Number: [id number] Date of Birth/Sex: 1926-10-21 (84 y.o. Female) Treating RN: Carolyne Fiscal, Debi Primary Care Asna Muldrow: Lucianne Lei Other Clinician: Referring Nyana Haren: Lucianne Lei Treating Deauna Yaw/Extender: Melburn Hake, [name] Weeks in Treatment: 31 Vascular Assessment Pulses: Posterior Tibial Extremity colors, hair growth, and conditions: Extremity Color: [Left:Hyperpigmented] Temperature of Extremity: [Left:Warm] Notes unable to access pulse and foot d/t dressing being on there from surgery Electronic Signature(s) Signed: 12/31/2017 4:17:47 PM By: Alric Quan Entered By: Alric Quan on 12/31/2017 15:36:11 Wilcoxen, FMBWGYKZ P. ([id number]) -------------------------------------------------------------------------------- Multi Wound Chart Details Patient Name: Alexa Lowes P. Date of Service: 12/31/2017 3:15 PM Medical Record Number: [id number] Patient Account Number: [id number] Date of Birth/Sex: 01-22-1926 (84 y.o. Female) Treating RN: Carolyne Fiscal, Debi Primary Care Inesha Sow: Lucianne Lei Other Clinician: Referring Aileen Amore: Lucianne Lei Treating Ineta Sinning/Extender: Melburn Hake, [name] Weeks in Treatment: 31 Vital Signs Height(in): 65 Pulse(bpm): 25 Weight(lbs): 131 Blood Pressure(mmHg): 140/65 Body Mass Index(BMI): 22 Temperature(F): 97.6 Respiratory Rate 16 (breaths/min): Photos: [1:No Photos] [N/A:N/A] Wound Location: [1:Left Lower Leg - Anterior] [N/A:N/A] Wounding Event: [1:Trauma] [N/A:N/A] Primary Etiology: [1:Diabetic Wound/Ulcer of the Lower Extremity] [N/A:N/A] Secondary Etiology: [1:Trauma, Other] [N/A:N/A] Comorbid History: [1:Anemia, Hypertension, Type II Diabetes, Osteoarthritis] [N/A:N/A] Date Acquired: [1:05/02/2017] [N/A:N/A] Weeks of Treatment: [1:31] [N/A:N/A] Wound Status: [1:Open] [N/A:N/A] Measurements L x W x D [1:6.9x3.2x0.2] [N/A:N/A] (  cm) Area (cm) : [1:17.342] [N/A:N/A] Volume (cm) : [1:3.468] [N/A:N/A] % Reduction in Area: [1:-260.80%] [N/A:N/A] % Reduction in Volume: [1:-260.90%] [N/A:N/A] Classification: [1:Grade 2] [N/A:N/A] Exudate Amount: [1:Large] [N/A:N/A] Exudate Type: [1:Serosanguineous] [N/A:N/A] Exudate Color: [1:red, brown] [N/A:N/A] Wound Margin: [1:Flat and Intact] [N/A:N/A] Granulation Amount: [1:Medium (34-66%)] [N/A:N/A] Granulation Quality: [1:Red] [N/A:N/A] Necrotic Amount: [1:Medium (34-66%)] [N/A:N/A] Exposed Structures: [1:Fascia: Yes Fat Layer (Subcutaneous Tissue) Exposed: Yes Tendon: Yes Muscle: No Joint: No Bone: No] [N/A:N/A] Epithelialization: [1:Small (1-33%)] [N/A:N/A] Periwound Skin Texture: [1:Scarring: Yes Excoriation: No Induration: No Callus: No] [N/A:N/A] Crepitus: No Rash: No Periwound Skin Moisture: Maceration: Yes N/A N/A Dry/Scaly: No Periwound Skin Color: Hemosiderin Staining: Yes N/A N/A Atrophie Blanche: No Cyanosis: No Ecchymosis: No Erythema: No Mottled: No Pallor: No Rubor: No Temperature: No Abnormality N/A N/A Tenderness on Palpation: Yes N/A N/A Wound  Preparation: Ulcer Cleansing: N/A N/A Rinsed/Irrigated with Saline Topical Anesthetic Applied: Other: lidocaine 4% Treatment Notes Electronic Signature(s) Signed: 12/31/2017 4:17:47 PM By: Alric Quan Entered By: Alric Quan on 12/31/2017 15:36:27 [name], [name] ([id number]) -------------------------------------------------------------------------------- [place] Details Patient Name: Alexa Lowes P. Date of Service: 12/31/2017 3:15 PM Medical Record Number: [id number] Patient Account Number: [id number] Date of Birth/Sex: 04-29-1926 (84 y.o. Female) Treating RN: Carolyne Fiscal, Debi Primary Care Drianna Chandran: Lucianne Lei Other Clinician: Referring Nicosha Struve: Lucianne Lei Treating Tamatha Gadbois/Extender: Melburn Hake, [name] Weeks in Treatment: 31 Active Inactive ` Abuse / Safety / Falls / Self Care Management Nursing Diagnoses: Potential for falls Goals: Patient will remain injury free related to falls Date Initiated: 05/28/2017 Target Resolution Date: 07/23/2017 Goal Status: Active Interventions: Assess fall risk on admission and as needed Notes: ` Orientation to the Wound Care Program Nursing Diagnoses: Knowledge deficit related to the wound healing center program Goals: Patient/caregiver will verbalize understanding of the [place] Program Date Initiated: 05/28/2017 Target Resolution Date: 07/23/2017 Goal Status: Active Interventions: Provide education on orientation to the wound center Notes: ` Wound/Skin Impairment Nursing Diagnoses: Impaired tissue integrity Goals: Ulcer/skin breakdown will have a volume reduction of 30% by week 4 Date Initiated: 05/28/2017 Target Resolution Date: 07/23/2017 Goal Status: Active Ulcer/skin breakdown will have a volume reduction of 50% by week 8 Date Initiated: 05/28/2017 Target Resolution Date: 07/23/2017 JAYLEAH, GARBERS ([id number]) Goal Status: Active Ulcer/skin breakdown will have a volume  reduction of 80% by week 12 Date Initiated: 05/28/2017 Target Resolution Date: 07/23/2017 Goal Status: Active Ulcer/skin breakdown will heal within 14 weeks Date Initiated: 05/28/2017 Target Resolution Date: 07/23/2017 Goal Status: Active Interventions: Assess patient/caregiver ability to obtain necessary supplies Assess patient/caregiver ability to perform ulcer/skin care regimen upon admission and as needed Assess ulceration(s) every visit Notes: Electronic Signature(s) Signed: 12/31/2017 4:17:47 PM By: Alric Quan Entered By: Alric Quan on 12/31/2017 15:36:21 [name], [name] ([id number]) -------------------------------------------------------------------------------- Pain Assessment Details Patient Name: Alexa Lowes P. Date of Service: 12/31/2017 3:15 PM Medical Record Number: [id number] Patient Account Number: [id number] Date of Birth/Sex: 15-Jan-1926 (84 y.o. Female) Treating RN: Carolyne Fiscal, Debi Primary Care Ellieanna Funderburg: Lucianne Lei Other Clinician: Referring Zaida Reiland: Lucianne Lei Treating Ephriam Turman/Extender: Melburn Hake, [name] Weeks in Treatment: 31 Active Problems Location of Pain Severity and Description of Pain Patient Has Paino Yes Site Locations Pain Location: Pain in Ulcers Rate the pain. Current Pain Level: 4 Pain Management and Medication Current Pain Management: Notes Topical or injectable lidocaine is offered to patient for acute pain when surgical debridement is performed. If needed, Patient is instructed to use over the counter pain medication for the following 24-48 hours after debridement. Wound care  MDs do not prescribed pain medications. Patient has chronic pain or uncontrolled pain. Patient has been instructed to make an appointment with their Primary Care Physician for pain management. Electronic Signature(s) Signed: 12/31/2017 4:17:47 PM By: Alric Quan Entered By: Alric Quan on 12/31/2017 15:16:11 [name]  ([id number]) -------------------------------------------------------------------------------- Patient/Caregiver Education Details Patient Name: Alexa Lowes P. Date of Service: 12/31/2017 3:15 PM Medical Record Number: [id number] Patient Account Number: [id number] Date of Birth/Gender: [birth year] (84 y.o. Female) Treating RN: Carolyne Fiscal, Debi Primary Care Physician: Lucianne Lei Other Clinician: Referring Physician: Lucianne Lei Treating Physician/Extender: Sharalyn Ink in Treatment: 31 Education Assessment Education Provided To: Patient Education Topics Provided Wound/Skin Impairment: Handouts: Caring for Your Ulcer, Other: change dressing as ordered Methods: Demonstration, Explain/Verbal Responses: State content correctly Electronic Signature(s) Signed: 12/31/2017 4:17:47 PM By: Alric Quan Entered By: Alric Quan on 12/31/2017 16:11:12 [name], Branch. ([id number]) -------------------------------------------------------------------------------- Wound Assessment Details Patient Name: Alexa Lowes P. Date of Service: 12/31/2017 3:15 PM Medical Record Number: [id number] Patient Account Number: [id number] Date of Birth/Sex: [birth year] (84 y.o. Female) Treating RN: Carolyne Fiscal, Debi Primary Care Kye Hedden: Lucianne Lei Other Clinician: Referring Cindie Rajagopalan: Lucianne Lei Treating Blythe Veach/Extender: Melburn Hake, [name] Weeks in Treatment: 31 Wound Status Wound Number: 1 Primary Etiology: Diabetic Wound/Ulcer of the Lower Extremity Wound Location: Left Lower Leg - Anterior Secondary Trauma, Other Wounding Event: Trauma Etiology: Date Acquired: 05/02/2017 Wound Status: Open Weeks Of Treatment: 31 Comorbid Anemia, Hypertension, Type II Diabetes, Clustered Wound: No History: Osteoarthritis Photos Photo Uploaded By: Alric Quan on 12/31/2017 16:25:49 Wound Measurements Length: (cm) 6.9 Width: (cm) 3.2 Depth: (cm) 0.2 Area: (cm) 17.342 Volume: (cm)  3.468 % Reduction in Area: -260.8% % Reduction in Volume: -260.9% Epithelialization: Small (1-33%) Tunneling: No Undermining: No Wound Description Classification: Grade 2 Wound Margin: Flat and Intact Exudate Amount: Large Exudate Type: Serosanguineous Exudate Color: red, brown Foul Odor After Cleansing: No Slough/Fibrino Yes Wound Bed Granulation Amount: Medium (34-66%) Exposed Structure Granulation Quality: Red Fascia Exposed: Yes Necrotic Amount: Medium (34-66%) Fat Layer (Subcutaneous Tissue) Exposed: Yes Necrotic Quality: Adherent Slough Tendon Exposed: Yes Muscle Exposed: No Joint Exposed: No Bone Exposed: No Periwound Skin Texture [name], [name] ([id number]) Texture Color No Abnormalities Noted: No No Abnormalities Noted: No Callus: No Atrophie Blanche: No Crepitus: No Cyanosis: No Excoriation: No Ecchymosis: No Induration: No Erythema: No Rash: No Hemosiderin Staining: Yes Scarring: Yes Mottled: No Pallor: No Moisture Rubor: No No Abnormalities Noted: No Dry / Scaly: No Temperature / Pain Maceration: Yes Temperature: No Abnormality Tenderness on Palpation: Yes Wound Preparation Ulcer Cleansing: Rinsed/Irrigated with Saline Topical Anesthetic Applied: Other: lidocaine 4%, Treatment Notes Wound #1 (Left, Anterior Lower Leg) 1. Cleansed with: Clean wound with Normal Saline 2. Anesthetic Topical Lidocaine 4% cream to wound bed prior to debridement 4. Dressing Applied: Hydrogel Promogran 5. Secondary Dressing Applied ABD Pad Kerlix/Conform 7. Secured with Tape Notes netting Electronic Signature(s) Signed: 12/31/2017 4:17:47 PM By: Alric Quan Entered By: Alric Quan on 12/31/2017 15:24:28 [name], [name] ([id number]) -------------------------------------------------------------------------------- Wound Assessment Details Patient Name: Alexa Lowes P. Date of Service: 12/31/2017 3:15 PM Medical Record Number:  [id number] Patient Account Number: [id number] Date of Birth/Sex: 06-28-1926 (84 y.o. Female) Treating RN: Carolyne Fiscal, Debi Primary Care Meera Vasco: Lucianne Lei Other Clinician: Referring Viggo Perko: Lucianne Lei Treating Dorine Duffey/Extender: [name], [name] Weeks in Treatment: 31 Wound Status Wound Number: 2 Primary Etiology: Arterial Insufficiency Ulcer Wound Location: Left, Plantar Toe Great Wound Status: Amputation Wounding Event: Trauma Outcome Level: Transmetatarsal Date Acquired: 10/28/2017 Weeks Of Treatment: 9 Clustered  Wound: No Wound Description Full Thickness Without Exposed Support Classification: Structures Periwound Skin Texture Texture Color No Abnormalities Noted: No No Abnormalities Noted: No Moisture No Abnormalities Noted: No Electronic Signature(s) Signed: 12/31/2017 4:17:47 PM By: Alric Quan Entered By: Alric Quan on 12/31/2017 15:46:21 [name], [name] ([id number]) -------------------------------------------------------------------------------- Vitals Details Patient Name: Alexa Lowes P. Date of Service: 12/31/2017 3:15 PM Medical Record Number: [id number] Patient Account Number: [id number] Date of Birth/Sex: 09/19/1926 (84 y.o. Female) Treating RN: Carolyne Fiscal, Debi Primary Care Tailor Westfall: Lucianne Lei Other Clinician: Referring Lasonia Casino: Lucianne Lei Treating Osmar Howton/Extender: Melburn Hake, [name] Weeks in Treatment: 31 Vital Signs Time Taken: 15:16 Temperature (F): 97.6 Height (in): 65 Pulse (bpm): 93 Weight (lbs): 131 Respiratory Rate (breaths/min): 16 Body Mass Index (BMI): 21.8 Blood Pressure (mmHg): 140/65 Reference Range: 80 - 120 mg / dl Electronic Signature(s) Signed: 12/31/2017 4:17:47 PM By: Alric Quan Entered By: Alric Quan on 12/31/2017 15:18:14

## 2018-01-02 NOTE — Progress Notes (Signed)
BIBI, ECONOMOS ([id number]) Visit Report for 12/31/2017 Chief Complaint Document Details Patient Name: [name], [name] Date of Service: 12/31/2017 3:15 PM Medical Record Number: [id number] Patient Account Number: [id number] Date of Birth/Sex: 06-21-1926 (84 y.o. Female) Treating RN: Carolyne Fiscal, Debi Primary Care Provider: Lucianne Lei Other Clinician: Referring Provider: Lucianne Lei Treating Provider/Extender: Melburn Hake, HOYT Weeks in Treatment: 31 Information Obtained from: Patient Chief Complaint Patients presents for follow up for left lower leg ulcer Electronic Signature(s) Signed: 12/31/2017 4:48:53 PM By: Worthy Keeler PA-C Entered By: Worthy Keeler on 12/31/2017 15:46:44 Bangs, [name] ([id number]) -------------------------------------------------------------------------------- HPI Details Patient Name: Alexa Lowes P. Date of Service: 12/31/2017 3:15 PM Medical Record Number: [id number] Patient Account Number: [id number] Date of Birth/Sex: 27-Oct-1926 (84 y.o. Female) Treating RN: Carolyne Fiscal, Debi Primary Care Provider: Lucianne Lei Other Clinician: Referring Provider: Lucianne Lei Treating Provider/Extender: Melburn Hake, HOYT Weeks in Treatment: 31 History of Present Illness HPI Description: 84 year old patient was seen in the ED about 10 days ago for a history of abrasion to the left lower extremity while she was boarding a bus and scraped the anterior part of her left leg. She has been a diabetic for many years and has been taking treatment regularly. Her past medical history is also significant for anemia arthritis constipation and hypertension and is status post abdominal hysterectomy. She has never been a smoker. after her ER visit she was advised to apply Bactroban ointment to the wound twice a day and continue to monitor her blood glucose levels. her last hemoglobin A1c was done 3 years ago and was 6.6% 06/04/17 patient left anterior shin wound appears to be  doing well although it is still somewhat dry despite the treatment with Medihoney. We have been using Kerlex following the Medihoney application and I believe this is just not retaining that much moisture. Nonetheless there is no evidence of infection. 06/11/17 on evaluation today patient appears to be doing a little bit worse in regard to her wound. The entirety of the wound is dry and unfortunately the Medihoney does not seem to be helping this. That is even with the dressing changes that I made last week to try to retain more moisture. She has not tried [employer] as of yet. She was also noncompressible when testing for ABIs. 07/01/2017 -- she had a arterial studies done and was seen by Dr. Lucky Cowboy. her noninvasive studies showed noncompressible vessels on the right but brisk waveforms and digital pressures of 71 on the right consistent with only mild arterial insufficienc. On the left her ABI was 0.47 and this may be falsely elevated due to calcification. No digital pressures were obtained on the left and this is consistent with severe arterial insufficiency. this critical limb threatening situation on the left, would make wound healing difficult and it represents a serious situation and he has recommended an angiography with possible revascularization. The patient desired to defer the decision to she discuss with the family members. 07/09/17 on evaluation today patient appears to be doing fairly well in regard to her left anterior lower extremity wound. She has been tolerating the dressing changes without complication we have been utilizing Santyl at this time. She tells me that she is having no significant pain compared to what she has had in the past. She has decided after discussing with family that she is going to go forward with the surgery with Dr. dew. This is to restore better blood flow to left lower extremity. With that it does appear that her  lower extremity wound is making some progress  albeit slow. No fevers, chills, nausea, or vomiting noted at this time. 07/16/17 on evaluation today patient's wound appears to be doing roughly about the same although some of the slough is clearing off. [name] she has her appointment with vascular next week on Thursday the 23rd. With that being said her wound does not appear to be hurting as badly as it has in the past which is good news. No fevers, chills, nausea, or vomiting noted at this time. 07/30/2017 -- she had surgery on 07/22/2017 --indications being nonhealing ulcer on the left leg with a noninvasive study showing marked reduction in ABI of less than 0.5 and no digital pressure on her left leg. she had a percutaneous transluminal angioplasty of the left common and external iliac arteries. She then had a stent placed to the left external iliac arteries and to the left common iliac artery. 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 The flow distally was poor and there were multiple areas of high-grade stenosis or occlusion of the distal SFA and the popliteal artery with only a diseased peroneal artery as runoff distally. She has a postop appointment on September 23 08/20/2017 -- she went back to the vascular office for an appointment on 08/12/2017 and bilateral ABIs were done which were notable for moderate right lower extremity arterial disease and unable to obtain left ankle brachial indices due to absent Doppler velocities in the posterior tibial artery and the anterior tibial artery. Monophasic waveforms to the left femoral and popliteal artery. this was worrisome for a failure of stents placed and lower extremity angiogram was recommended. as was [name], [name] ([id number]) done on 08/16/2017 and the stents placed in the left eye Lex system had some mild narrowing at the iliac bifurcation and the leading edge of the stent proximally was poor  wall apposition with stenosis in the 50-60% range in the right common iliac artery. There was also occlusion of the common femoral artery and the origin of the SFA and profunda femoris artery. Flow distally was poor but they appeared to be multiple areas of high-grade stenosis or occlusion in the distal SFA and popliteal artery with only a diseased peroneal artery. in another stent was placed and deployed. The femoral occlusion would need to be treated surgically. 08/27/17 on evaluation today patient appears to be doing about the same in regard to her left for extremity wound. Unfortunately secondary to her vascular status this is not a very well healing wound at this time. She has been tolerating the dressing changes she has some discomfort but mainly with cleansing of the wound not at other times. No fevers, chills, nausea, or vomiting noted at this time. 09/17/2017 -- the patient was recently discharged from the hospital where she was admitted between 09/08/2017 and 09/10/2017, when she had a procedure on her left femoral artery with the endarterectomy and a patch. she will be going back for review this coming week. 09/24/2017 -- was seen on 09/18/2017 in the ER, for bleeding from her wound which was debrided earlier during the day. At the time she was seen there was no active bleeding. A thrombin pad was placed over the area and she was asked to change the outside dressing but come back to the wound center as planned. she was also seen at the [facility] office for a review after her  surgery and bilateral lower extremity ABI when compared to the previous ones showed minimal improvement of arterial blood flow. She was set up for a CT angiogram of the abdomen and pelvis and lower extremities to see the degree of peripheral arterial disease 10/28/2017 -- had a x-ray of the left tibia and fibula done on 10/07/2017 -- showed no bony acute abnormality she was reviewed by Dr.  Corene Cornea dew on 10/26/2017, and he reviewed her CT angiogram and this demonstrated a widely patent left femoral endarterectomy including the origins of the femoral artery. She also had moderate distal disease in the popliteal artery, distal SFA and tibial disease. Her peroneal artery is a dominant runoff distally.he felt she had adequate perfusion for healing the mid calf level and he is putting off all surgical intervention for now and is going to see her back in 2-3 months for noninvasive studies. She also has a new wound on the plantar aspect of the left big toe where she pulled a piece of skin and has caused a superficial ulceration 11/19/17 on evaluation today patient appears to be doing decently well at this point in time. It does appear that her Wound VAC has been approved as ordered by Dr. Con Memos however she has not received that as of yet. Nonetheless the wound bed in general appears to have minimal slough noted at this time in a fairly good amount of granulation although there is to be a noted in the center of the wound. No fevers, chills, nausea, or vomiting noted at this time. 12/02/17 on evaluation today patient appears to be doing about the same in regard to her left anterior shin wound. Unfortunately her left toe wound appears to still be doing about the same as far as the eschar covering. This has softened up a bit to the point that it is now loose and could have selective debridement which would allow for the Santyl to work better. Fortunately she is having no pain. 12/10/17 on evaluation today patient appears to be doing fairly well in regard to her left anterior shin wound and that it at least appears to be stable. With that being said she does have really no significant healing at this point this wound seems to be mainly just maintaining. Subsequently patient also has the left toe wound which does not seem to be progressing. There is still nonviable tissue overlying the surface of the  wound and this doesn't even seem to be loosening up very well at all. And patient has been using the Santyl. I do think this may need selective debridement today. Fortunately there does not appear to be any evidence of infection although I am concerned about the possibility that patient's blood flow is not likely sufficient to be able to tolerate appropriate wound healing. This was discussed with patient today as well although obviously I would leave the determination up to vascular surgery. She will be seeing Dr. dew on February 5 for repeat vascular studies and then in office evaluation. 12/17/17 on evaluation today patient appears to be doing about the same in regard to her left shin and left great toe ulcers. She has been performing the dressing changes as previously recommended over the past week. With that being said unfortunately nothing seems to be changing much in regard to the dimensions of the wound and I do feel again that this may be related to vascular flow. Patient's daughter was present during the evaluation today and we did discuss this as well. I  will be detailed in greater detail in the plan. [name], [name] ([id number]) 12/23/17 she is here in follow up evaluation of a left great toe and left anterior leg ulcer. There is noted ischemic changes (urple, dry gangrene, shriveling effect laterally) to the left great toe, although not overtly cold. Contact was made to Dr Ozella Almond office regarding possibility of seeing her in the office today or tomorrow as this is an acute change; they recommended she go to the emergency department for evaluation by Dr Ozella Almond partner Dr Curley Spice. The ER was contacted, spoke with Tiffany, regarding this. Patient was accompanied by her son. The left anterior shin ulcer is improved in measurement, overtly exposed bone with area of discoloration/necrosis. will follow up after hospitalization/vascular intervention 12/31/17 on evaluation today patient is status  post having gone to the ER last week on 12/23/17. 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 This was performed by Dr. Cleda Mccreedy on 12/23/17. Subsequently Dr. Corene Cornea do with [facility] performed percutaneous evaluation and intervention of patient's left lower extremity on 12/27/17. Angioplasty was performed following angiogram of the left peroneal artery and tibial angioplasty was also performed of the left popliteal artery. Angioplasty of the mid and distal superficial femoral arteries was likewise undertaken. Stents were placed in the left above knee popliteal artery following angioplasty and then a second stent placed in the mid- SFA secondary to 50% residual stenosis following the angioplasty. Fortunately patient was much better post procedure as far as stenosis/occlusions were concerned and she did have definitely much better blood flow which was noted to be evident on evaluation today. Electronic Signature(s) Signed: 12/31/2017 4:48:53 PM By: Worthy Keeler PA-C Entered By: Worthy Keeler on 12/31/2017 15:52:03 [name], [name] ([id number]) -------------------------------------------------------------------------------- Physical Exam Details Patient Name: Alexa Lowes P. Date of Service: 12/31/2017 3:15 PM Medical Record Number: [id number] Patient Account Number: [id number] Date of Birth/Sex: Feb 10, 1926 (84 y.o. Female) Treating RN: Carolyne Fiscal, Debi Primary Care Provider: Lucianne Lei Other Clinician: Referring Provider: Lucianne Lei Treating Provider/Extender: STONE III, HOYT Weeks in Treatment: 31 Constitutional Thin and well-hydrated in no acute distress. Respiratory normal breathing without difficulty. clear to auscultation bilaterally. Cardiovascular regular rate and rhythm with normal S1, S2. 1+ dorsalis pedis/posterior tibialis pulses. Psychiatric this patient is able to make decisions and demonstrates good insight into disease  process. Alert and Oriented x 3. pleasant and cooperative. Notes On evaluation today patient's wound on the left shin appears to be doing much better. There's excellent blood flow and this definitely appears to be healthy compared to what we have seen recently. Subsequently the bone is still exposed but it does appear that the tissue that is granulation tissue is starting to grow over which is excellent news. Unfortunately she did have to undergo an application of the left great toe and this seems to be doing well I did not look at the wound today as it was covered and obviously we are not following this wound at this time. Nonetheless she seems to be doing well she was in good spirits she was smiling fortunately everything appears to be very good. Electronic Signature(s) Signed: 12/31/2017 4:48:53 PM By: Worthy Keeler PA-C Entered By: Worthy Keeler on 12/31/2017 15:59:03 Marti, [name] ([id number]) -------------------------------------------------------------------------------- Physician Orders Details Patient Name: Alexa Lowes P. Date of Service: 12/31/2017 3:15 PM Medical Record Number: [id number] Patient Account Number: [id number] Date of Birth/Sex: 1926-02-08 (84 y.o. Female) Treating RN: Ahmed Prima Primary Care Provider:  BLAND, VEITA Other Clinician: Referring Provider: Lucianne Lei Treating Provider/Extender: STONE III, HOYT Weeks in Treatment: 31 Verbal / Phone Orders: Yes Clinician: Pinkerton, Debi Read Back and Verified: Yes Diagnosis Coding Wound Cleansing Wound #1 Left,Anterior Lower Leg o Clean wound with Normal Saline. o Cleanse wound with mild soap and water Anesthetic (add to Medication List) Wound #1 Left,Anterior Lower Leg o Topical Lidocaine 4% cream applied to wound bed prior to debridement (In Clinic Only). Primary Wound Dressing Wound #1 Left,Anterior Lower Leg o Mepitel One Contact layer - with Hydrogel over Bone o Promogran - place  on the wound bed except the bone (moisten the Promogran with the Hydrogel as well) Secondary Dressing Wound #1 Left,Anterior Lower Leg o ABD and Kerlix/Conform o Non-adherent pad o Other - tape and stretch net to secure Dressing Change Frequency Wound #1 Left,Anterior Lower Leg o Change Dressing Monday, Wednesday, Friday Follow-up Appointments Wound #1 Left,Anterior Lower Leg o Return Appointment in 1 week. Home Health Wound #1 [place] Visits o Home Health Nurse may visit PRN to address patientos wound care needs. o FACE TO FACE ENCOUNTER: MEDICARE and MEDICAID PATIENTS: I certify that this patient is under my care and that I had a face-to-face encounter that meets the physician face-to-face encounter requirements with this patient on this date. The encounter with the patient was in whole or in part for the following MEDICAL CONDITION: (primary reason for [place]) MEDICAL NECESSITY: I certify, that based on my findings, NURSING services are a medically necessary home health service. HOME BOUND STATUS: I certify that my clinical findings support that this patient is homebound (i.e., Due to illness or injury, pt requires aid of supportive devices such as crutches, cane, wheelchairs, walkers, the use of special transportation or the assistance of another person to leave their place of residence. There is a normal inability to leave the home and doing so requires considerable and taxing effort. Other absences are for medical reasons / religious services and are infrequent or of short duration when for other reasons). [name], [name] P. ([id number]) o If current dressing causes regression in wound condition, may D/C ordered dressing product/s and apply Normal Saline Moist Dressing daily until next [place] / Other MD appointment. [place] of regression in wound condition at [phone]. o  Please direct any NON-WOUND related issues/requests for orders to patient's Primary Care Physician Negative Pressure Wound Therapy Wound #1 Left,Anterior Lower Leg o Place NPWT on HOLD. Patient Medications Allergies: No Known Drug Allergies Notifications Medication Indication Start End lidocaine DOSE 1 - topical 4 % cream - 1 cream topical Electronic Signature(s) Signed: 12/31/2017 4:17:47 PM By: Alric Quan Signed: 12/31/2017 4:48:53 PM By: Worthy Keeler PA-C Entered By: Alric Quan on 12/31/2017 15:46:42 [name], [name] ([id number]) -------------------------------------------------------------------------------- Prescription 12/31/2017 Patient Name: Alexa Lowes P. Provider: Worthy Keeler PA-C Date of Birth: Sep 03, 1926 NPI#: [id number] Sex: F DEA#: [id number] Phone #: [phone] License #: Patient Address: [place] [address] [place], [place] [address] [address] [place], [place] [address] [phone] Allergies No Known Drug Allergies Medication Medication: Route: Strength: Form: lidocaine 4 % topical cream topical 4% cream Class: TOPICAL LOCAL ANESTHETICS Dose: Frequency / Time: Indication: 1 1 cream topical Number of Refills: Number of Units: 0 Generic Substitution: Start Date: End Date: One Time Use: Substitution Permitted No Note to Pharmacy: Signature(s): Date(s): Electronic Signature(s) Signed: 12/31/2017 4:17:47 PM By:  Alric Quan Signed: 12/31/2017 4:48:53 PM By: Worthy Keeler PA-C Entered By: Alric Quan on 12/31/2017 15:46:43 [name], [name] ([id number]) --------------------------------------------------------------------------------  Problem List Details Patient Name: Alexa Lowes P. Date of Service: 12/31/2017 3:15 PM Medical Record Number: [id number] Patient Account Number: [id number] Date of Birth/Sex: 1926-05-23 (84 y.o.  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 ([id number]) -------------------------------------------------------------------------------- Progress Note Details Patient Name: Alexa Lowes P. Date of Service: 12/31/2017 3:15 PM Medical Record Number: [id number] Patient Account Number: [id number] Date of Birth/Sex: 1926-11-15 (84 y.o. Female) Treating RN: Carolyne Fiscal, Debi Primary Care Provider: Lucianne Lei Other Clinician: Referring Provider: Lucianne Lei Treating Provider/Extender: Melburn Hake, HOYT Weeks in Treatment: 31 Subjective Chief Complaint Information obtained from Patient Patients presents for follow up for left lower leg ulcer History of Present Illness (HPI) 84 year old patient was seen in the ED about 10 days ago for a history of abrasion to the left lower extremity while she was boarding a bus and scraped the anterior part of her left leg. She has been a diabetic for many years and has been taking treatment regularly. Her past medical history is also significant for anemia arthritis constipation and  hypertension and is status post abdominal hysterectomy. She has never been a smoker. after her ER visit she was advised to apply Bactroban ointment to the wound twice a day and continue to monitor her blood glucose levels. her last hemoglobin A1c was done 3 years ago and was 6.6% 06/04/17 patient left anterior shin wound appears to be doing well although it is still somewhat dry despite the treatment with Medihoney. We have been using Kerlex following the Medihoney application and I believe this is just not retaining that much moisture. Nonetheless there is no evidence of infection. 06/11/17 on evaluation today patient appears to be doing a little bit worse in regard to her wound. The entirety of the wound is dry and unfortunately the Medihoney does not seem to be helping this. That is even with the dressing changes that I made last week to try to retain more moisture. She has not tried [employer] as of yet. She was also noncompressible when testing for ABIs. 07/01/2017 -- she had a arterial studies done and was seen by Dr. Lucky Cowboy. her noninvasive studies showed noncompressible vessels on the right but brisk waveforms and digital pressures of 71 on the right consistent with only mild arterial insufficienc. On the left her ABI was 0.47 and this may be falsely elevated due to calcification. No digital pressures were obtained on the left and this is consistent with severe arterial insufficiency. this critical limb threatening situation on the left, would make wound healing difficult and it represents a serious situation and he has recommended an angiography with possible revascularization. The patient desired  to defer the decision to she discuss with the family members. 07/09/17 on evaluation today patient appears to be doing fairly well in regard to her left anterior lower extremity wound. She has been tolerating the dressing changes without complication we have been utilizing Santyl at this time. She tells me  that she is having no significant pain compared to what she has had in the past. She has decided after discussing with family that she is going to go forward with the surgery with Dr. dew. This is to restore better blood flow to left lower extremity. With that it does appear that her lower extremity wound is making some progress albeit slow. No fevers, chills, nausea, or vomiting noted at this time. 07/16/17 on evaluation today patient's wound appears to be doing roughly about the same although some of the slough is clearing off. [name] she has her appointment with vascular next week on Thursday the 23rd. With that being said her wound does not appear to be hurting as badly as it has in the past which is good news. No fevers, chills, nausea, or vomiting noted at this time. 07/30/2017 -- she had surgery on 07/22/2017 --indications being nonhealing ulcer on the left leg with a noninvasive study showing marked reduction in ABI of less than 0.5 and no digital pressure on her left leg. she had a percutaneous transluminal angioplasty of the left common and external iliac arteries. She then had a stent placed to the left external iliac arteries and to the left common iliac artery. 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 The flow distally was poor and there were multiple areas of high-grade stenosis or occlusion of the distal SFA and the popliteal artery with only a diseased peroneal artery as runoff distally. [name], [name] ([id number]) She has a postop appointment on September 23 08/20/2017 -- she went back to the vascular office for an appointment on 08/12/2017 and bilateral ABIs were done which were notable for moderate right lower extremity arterial disease and unable to obtain left ankle brachial indices due to absent Doppler velocities in the posterior tibial artery and the anterior tibial artery.  Monophasic waveforms to the left femoral and popliteal artery. this was worrisome for a failure of stents placed and lower extremity angiogram was recommended. as was done on 08/16/2017 and the stents placed in the left eye Lex system had some mild narrowing at the iliac bifurcation and the leading edge of the stent proximally was poor wall apposition with stenosis in the 50-60% range in the right common iliac artery. There was also occlusion of the common femoral artery and the origin of the SFA and profunda femoris artery. Flow distally was poor but they appeared to be multiple areas of high-grade stenosis or occlusion in the distal SFA and popliteal artery with only a diseased peroneal artery. in another stent was placed and deployed. The femoral occlusion would need to be treated surgically. 08/27/17 on evaluation today patient appears to be doing about the same in regard to her left for extremity wound. Unfortunately secondary to her vascular status this is not a very well healing wound at this time. She has been tolerating the dressing changes she has some discomfort but mainly with cleansing of the wound not at other times. No fevers, chills, nausea, or vomiting noted at this time. 09/17/2017 -- the patient was recently discharged from the hospital where she was admitted  between 09/08/2017 and 09/10/2017, when she had a procedure on her left femoral artery with the endarterectomy and a patch. she will be going back for review this coming week. 09/24/2017 -- was seen on 09/18/2017 in the ER, for bleeding from her wound which was debrided earlier during the day. At the time she was seen there was no active bleeding. A thrombin pad was placed over the area and she was asked to change the outside dressing but come back to the wound center as planned. she was also seen at the [facility] for a review after her surgery and bilateral lower extremity ABI when compared  to the previous ones showed minimal improvement of arterial blood flow. She was set up for a CT angiogram of the abdomen and pelvis and lower extremities to see the degree of peripheral arterial disease 10/28/2017 -- had a x-ray of the left tibia and fibula done on 10/07/2017 -- showed no bony acute abnormality she was reviewed by Dr. Corene Cornea dew on 10/26/2017, and he reviewed her CT angiogram and this demonstrated a widely patent left femoral endarterectomy including the origins of the femoral artery. She also had moderate distal disease in the popliteal artery, distal SFA and tibial disease. Her peroneal artery is a dominant runoff distally.he felt she had adequate perfusion for healing the mid calf level and he is putting off all surgical intervention for now and is going to see her back in 2-3 months for noninvasive studies. She also has a new wound on the plantar aspect of the left big toe where she pulled a piece of skin and has caused a superficial ulceration 11/19/17 on evaluation today patient appears to be doing decently well at this point in time. It does appear that her Wound VAC has been approved as ordered by Dr. Con Memos however she has not received that as of yet. Nonetheless the wound bed in general appears to have minimal slough noted at this time in a fairly good amount of granulation although there is to be a noted in the center of the wound. No fevers, chills, nausea, or vomiting noted at this time. 12/02/17 on evaluation today patient appears to be doing about the same in regard to her left anterior shin wound. Unfortunately her left toe wound appears to still be doing about the same as far as the eschar covering. This has softened up a bit to the point that it is now loose and could have selective debridement which would allow for the Santyl to work better. Fortunately she is having no pain. 12/10/17 on evaluation today patient appears to be doing fairly well in regard to her left  anterior shin wound and that it at least appears to be stable. With that being said she does have really no significant healing at this point this wound seems to be mainly just maintaining. Subsequently patient also has the left toe wound which does not seem to be progressing. There is still nonviable tissue overlying the surface of the wound and this doesn't even seem to be loosening up very well at all. And patient has been using the Santyl. I do think this may need selective debridement today. Fortunately there does not appear to be any evidence of infection although I am concerned about the possibility that patient's blood flow is not likely sufficient to be able to tolerate appropriate wound healing. This was discussed with patient today as well although obviously I would leave the determination up to  vascular surgery. She will be seeing Dr. dew on February 5 for repeat vascular studies and then in office evaluation. [name], [name] ([id number]) 12/17/17 on evaluation today patient appears to be doing about the same in regard to her left shin and left great toe ulcers. She has been performing the dressing changes as previously recommended over the past week. With that being said unfortunately nothing seems to be changing much in regard to the dimensions of the wound and I do feel again that this may be related to vascular flow. Patient's daughter was present during the evaluation today and we did discuss this as well. I will be detailed in greater detail in the plan. 12/23/17 she is here in follow up evaluation of a left great toe and left anterior leg ulcer. There is noted ischemic changes (urple, dry gangrene, shriveling effect laterally) to the left great toe, although not overtly cold. Contact was made to Dr Ozella Almond office regarding possibility of seeing her in the office today or tomorrow as this is an acute change; they recommended she go to the emergency department for evaluation by Dr  Ozella Almond partner Dr Curley Spice. The ER was contacted, spoke with Tiffany, regarding this. Patient was accompanied by her son. The left anterior shin ulcer is improved in measurement, overtly exposed bone with area of discoloration/necrosis. will follow up after hospitalization/vascular intervention 12/31/17 on evaluation today patient is status post having gone to the ER last week on 12/23/17. 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 This was performed by Dr. Cleda Mccreedy on 12/23/17. Subsequently Dr. Corene Cornea do with [facility] performed percutaneous evaluation and intervention of patient's left lower extremity on 12/27/17. Angioplasty was performed following angiogram of the left peroneal artery and tibial angioplasty was also performed of the left popliteal artery. Angioplasty of the mid and distal superficial femoral arteries was likewise undertaken. Stents were placed in the left above knee popliteal artery following angioplasty and then a second stent placed in the mid- SFA secondary to 50% residual stenosis following the angioplasty. Fortunately patient was much better post procedure as far as stenosis/occlusions were concerned and she did have definitely much better blood flow which was noted to be evident on evaluation today. Patient History Information obtained from Patient. Social History Never smoker, Marital Status - Separated, Alcohol Use - Never, Drug Use - No History, Caffeine Use - Rarely. Review of Systems (ROS) Constitutional Symptoms (General Health) Denies complaints or symptoms of Fever, Chills. Hematologic/Lymphatic The patient has no complaints or symptoms. Respiratory The patient has no complaints or symptoms. Psychiatric The patient has no complaints or symptoms. Objective Constitutional Thin and well-hydrated in no acute distress. Vitals Time Taken: 3:16 PM, Height: 65 in, Weight: 131 lbs, BMI: 21.8, Temperature: 97.6 F, Pulse:  93 bpm, Respiratory Rate: 16 breaths/min, Blood Pressure: 140/65 mmHg. Respiratory normal breathing without difficulty. clear to auscultation bilaterally. Cla[place]in, Valaria P. ([id number]) Cardiovascular regular rate and rhythm with normal S1, S2. 1+ dorsalis pedis/posterior tibialis pulses. Psychiatric this patient is able to make decisions and demonstrates good insight into disease process. Alert and Oriented x 3. pleasant and cooperative. General Notes: On evaluation today patient's wound on the left shin appears to be doing much better. There's excellent blood flow and this definitely appears to be healthy compared to what we have seen recently. Subsequently the bone is still exposed but it does appear that the tissue that is granulation tissue is starting to grow over which is  excellent news. Unfortunately she did have to undergo an application of the left great toe and this seems to be doing well I did not look at the wound today as it was covered and obviously we are not following this wound at this time. Nonetheless she seems to be doing well she was in good spirits she was smiling fortunately everything appears to be very good. Integumentary (Hair, Skin) Wound #1 status is Open. Original cause of wound was Trauma. The wound is located on the Left,Anterior Lower Leg. The wound measures 6.9cm length x 3.2cm width x 0.2cm depth; 17.342cm^2 area and 3.468cm^3 volume. There is tendon, Fat Layer (Subcutaneous Tissue) Exposed, and fascia exposed. There is no tunneling or undermining noted. There is a large amount of serosanguineous drainage noted. The wound margin is flat and intact. There is medium (34-66%) red granulation within the wound bed. There is a medium (34-66%) amount of necrotic tissue within the wound bed including Adherent Slough. The periwound skin appearance exhibited: Scarring, Maceration, Hemosiderin Staining. The periwound skin appearance did not exhibit: Callus, Crepitus,  Excoriation, Induration, Rash, Dry/Scaly, Atrophie Blanche, Cyanosis, Ecchymosis, Mottled, Pallor, Rubor, Erythema. Periwound temperature was noted as No Abnormality. The periwound has tenderness on palpation. Wound #2 status is Amputation. Original cause of wound was Trauma. The wound is located on the [employer]. 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 Cleanse wound with mild soap and water Anesthetic (add to Medication List): Wound #1 Left,Anterior Lower Leg: Topical Lidocaine 4% cream applied to wound bed prior to debridement (In Clinic Only). Primary Wound Dressing: [name], [name] ([id number]) Wound #1 Left,Anterior Lower Leg: Mepitel One Contact layer - with Hydrogel over Bone Promogran - place on the wound bed except the bone (moisten the Promogran with the Hydrogel as well) Secondary Dressing: Wound #1 Left,Anterior Lower Leg: ABD and Kerlix/Conform Non-adherent pad Other - tape and stretch net to secure Dressing Change Frequency: Wound #1 Left,Anterior Lower Leg: Change Dressing Monday, Wednesday, Friday Follow-up Appointments: Wound #1 Left,Anterior Lower Leg: Return Appointment in 1 week. Home Health: Wound #1 Left,Anterior Lower Leg: [place] Nurse may visit PRN to address patient s wound care needs. FACE TO FACE ENCOUNTER: MEDICARE and MEDICAID PATIENTS: I certify that this patient is under my care and that I had a face-to-face  encounter that meets the physician face-to-face encounter requirements with this patient on this date. The encounter with the patient was in whole or in part for the following MEDICAL CONDITION: (primary reason for [place]) MEDICAL NECESSITY: I certify, that based on my findings, NURSING services are a medically necessary home health service. HOME BOUND STATUS: I certify that my clinical findings support that this patient is homebound (i.e., Due to illness or injury, pt requires aid of supportive devices such as crutches, cane, wheelchairs, walkers, the use of special transportation or the assistance of another person to leave their place of residence. There is a normal inability to leave the home and doing so requires considerable and taxing effort.  Other absences are for medical reasons / religious services and are infrequent or of short duration when for other reasons). If current dressing causes regression in wound condition, may D/C ordered dressing product/s and apply Normal Saline Moist Dressing daily until next [place] / Other MD appointment. [place] of regression in wound condition at [phone]. Please direct any NON-WOUND related issues/requests for orders to patient's Primary Care Physician Negative Pressure Wound Therapy: Wound #1 Left,Anterior Lower Leg: Place NPWT on HOLD. The following medication(s) was prescribed: lidocaine topical 4 % cream 1 1 cream topical was prescribed at facility I am going to recommend that we continue with the Current wound care measures for the next week. This is in regard to the left shin that is. We will see were things stand at that time. Patient is in agreement with the plan. I'm still potentially considering the [place] which I think may be a possibility especially now that she has good blood flow I would like to see how this appears next week however. Please see above for specific wound care orders. We  will see patient for re-evaluation in 1 week(s) here in the clinic. If anything worsens or changes patient will contact our office for additional recommendations. Electronic Signature(s) Signed: 12/31/2017 4:48:53 PM By: Worthy Keeler PA-C Entered By: Worthy Keeler on 12/31/2017 16:01:30 [name], [name] ([id number]) -------------------------------------------------------------------------------- ROS/PFSH Details Patient Name: Alexa Lowes P. Date of Service: 12/31/2017 3:15 PM Medical Record Number: [id number] Patient Account Number: [id number] Date of Birth/Sex: [birth year] (84 y.o. Female) Treating RN: Carolyne Fiscal, Debi Primary Care Provider: Lucianne Lei Other Clinician: Referring Provider: Lucianne Lei Treating Provider/Extender: Melburn Hake, HOYT Weeks in Treatment: 31 Information Obtained From Patient Wound History Do you currently have one or more open woundso Yes How many open wounds do you currently haveo 1 Approximately how long have you had your woundso 3 weeks How have you been treating your wound(s) until nowo open to air Has your wound(s) ever healed and then re-openedo No Have you had any lab work done in the past montho No Have you tested positive for an antibiotic resistant organism (MRSA, VRE)o No Have you tested positive for osteomyelitis (bone infection)o No Have you had any tests for circulation on your legso No Constitutional Symptoms (General Health) Complaints and Symptoms: Negative for: Fever; Chills Hematologic/Lymphatic Complaints and Symptoms: No Complaints or Symptoms Medical History: Positive for: Anemia Respiratory Complaints and Symptoms: No Complaints or Symptoms Cardiovascular Medical History: Positive for: Hypertension Endocrine Medical History: Positive for: Type II Diabetes Treated with: Oral agents Musculoskeletal Medical History: Positive for: Osteoarthritis Oncologic CHERY, GIUSTO ([id number]) Medical  History: Negative for: Received Chemotherapy; Received Radiation Psychiatric Complaints and Symptoms: No Complaints or Symptoms Immunizations Pneumococcal Vaccine: Received Pneumococcal Vaccination: Yes Immunization Notes: up to date Implantable Devices Family and Social History Never smoker; Marital Status - Separated; Alcohol Use: Never; Drug Use: No History; Caffeine Use: Rarely; Financial Concerns: No; Food, Clothing or Shelter Needs: No; Support System Lacking: No; Transportation Concerns: No; Advanced Directives: No; Patient does not want information on Advanced Directives Physician Affirmation I have reviewed and agree with the above information. Electronic Signature(s) Signed: 12/31/2017 4:17:47 PM By: Alric Quan Signed: 12/31/2017 4:48:53 PM By: Worthy Keeler PA-C Entered By: Worthy Keeler on 12/31/2017 15:57:15 [place], [name] ([id number]) -------------------------------------------------------------------------------- SuperBill Details Patient Name: Alexa Lowes P. Date of Service: 12/31/2017 Medical Record Number: [id number] Patient Account Number: [id number] Date of Birth/Sex: Apr 19, 1926 (84 y.o. 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 I70.245 Atherosclerosis of native arteries of left leg with ulceration of other part of foot Facility Procedures CPT4 Code: [id number] Description: 99213 - WOUND CARE VISIT-LEV 3 EST PT Modifier: Quantity: 1 Physician  Procedures CPT4 Code Description: [id number] 99213 - WC PHYS LEVEL 3 - EST PT ICD-10 Diagnosis Description E11.622 Type 2 diabetes mellitus with other skin ulcer L97.522 Non-pressure chronic ulcer of other part of left foot with fat la I70.242 Atherosclerosis of  native arteries of left leg with ulceration of L97.824 Non-pressure chronic ulcer of other part of left lower leg with n Modifier: yer exposed calf ecrosis of bone Quantity: 1 Electronic Signature(s) Signed: 12/31/2017 4:12:43 PM By: Alric Quan Signed: 12/31/2017 4:48:53 PM By: Worthy Keeler PA-C Entered By: Alric Quan on 12/31/2017 16:12:43

## 2018-01-03 ENCOUNTER — Ambulatory Visit: Payer: Medicare Other | Admitting: Podiatry

## 2018-01-04 ENCOUNTER — Ambulatory Visit (INDEPENDENT_AMBULATORY_CARE_PROVIDER_SITE_OTHER): Payer: Medicare Other | Admitting: Vascular Surgery

## 2018-01-04 ENCOUNTER — Encounter (INDEPENDENT_AMBULATORY_CARE_PROVIDER_SITE_OTHER): Payer: Self-pay | Admitting: Vascular Surgery

## 2018-01-04 ENCOUNTER — Ambulatory Visit (INDEPENDENT_AMBULATORY_CARE_PROVIDER_SITE_OTHER): Payer: Medicare Other

## 2018-01-04 VITALS — BP 135/72 | HR 99 | Resp 17 | Ht 66.5 in | Wt 115.0 lb

## 2018-01-04 DIAGNOSIS — I7025 Atherosclerosis of native arteries of other extremities with ulceration: Secondary | ICD-10-CM

## 2018-01-04 DIAGNOSIS — I10 Essential (primary) hypertension: Secondary | ICD-10-CM

## 2018-01-04 DIAGNOSIS — E119 Type 2 diabetes mellitus without complications: Secondary | ICD-10-CM | POA: Diagnosis not present

## 2018-01-04 NOTE — Assessment & Plan Note (Signed)
Her ABIs today are slightly improved on the right upper 0.69 and significantly improved on the left up to 0.81. Overall she seems to be doing better.  We will need to keep a close eye on her and I will see her back in about 2-3 months with ABIs.

## 2018-01-04 NOTE — Progress Notes (Signed)
MRN : [id number]  [name] is a 84 y.o. (Apr 09, 1926) female who presents with chief complaint of  Chief Complaint  Patient presents with  . Follow-up    2-3 month ABI f/u  .  History of Present Illness: Patient returns today in follow up of PAD.  Her foot has been doing better and she is scheduled to see the podiatrist tomorrow.  She reports no fever or chills.  Her ABIs today are slightly improved on the right upper 0.69 and significantly improved on the left up to 0.81.  She had no periprocedural complications after her recent angiogram.  Current Outpatient Medications  Medication Sig Dispense Refill  . ACCU-CHEK FASTCLIX LANCETS MISC TEST TID  1  . ACCU-CHEK GUIDE test strip     . amLODipine (NORVASC) 10 MG tablet Take 10 mg by mouth daily.    Marland Kitchen amoxicillin-clavulanate (AUGMENTIN) 875-125 MG tablet Take 1 tablet by mouth every 12 (twelve) hours. 20 tablet 0  . aspirin EC 81 MG tablet Take 81 mg by mouth daily.    Marland Kitchen atorvastatin (LIPITOR) 10 MG tablet Take 10 mg by mouth daily.    . Blood Glucose Monitoring Suppl (ACCU-CHEK GUIDE) w/Device KIT See admin instructions.  0  . Calcium-Magnesium-Zinc (CAL-MAG-ZINC PO) Take 1 tablet by mouth daily.    . carvedilol (COREG) 6.25 MG tablet Take 6.25 mg by mouth at bedtime.    . Cholecalciferol (VITAMIN D3 GUMMIES ADULT PO) Take 1 tablet by mouth daily.    . clopidogrel (PLAVIX) 75 MG tablet Take 1 tablet (75 mg total) by mouth daily with breakfast. 30 tablet 1  . doxycycline (VIBRA-TABS) 100 MG tablet Take 1 tablet (100 mg total) by mouth every 12 (twelve) hours. 20 tablet 0  . metFORMIN (GLUCOPHAGE) 1000 MG tablet Take 1,000 mg by mouth 2 (two) times daily with a meal.    . oxyCODONE-acetaminophen (PERCOCET/ROXICET) 5-325 MG tablet Take 1-2 tablets by mouth every 6 (six) hours as needed for severe pain. 20 tablet 0  . pantoprazole (PROTONIX) 20 MG tablet Take 1 tablet (20 mg total) by mouth daily. 30 tablet 1  . polyethylene glycol  (MIRALAX / GLYCOLAX) packet Take 17 g by mouth daily as needed (for constipation.).     Marland Kitchen SANTYL ointment Apply 1 application topically daily. Applied to wound of leg and dress wound  0  . traMADol (ULTRAM) 50 MG tablet Take 1 tablet (50 mg total) by mouth every 6 (six) hours as needed for moderate pain. 30 tablet 1  . valsartan-hydrochlorothiazide (DIOVAN-HCT) 320-25 MG tablet Take 1 tablet by mouth daily.     No current facility-administered medications for this visit.     Past Medical History:  Diagnosis Date  . Anemia    family unsure of this history  . Arthritis   . Chronic ulcer of calf (HCC)    Left  . Constipation   . Diabetes mellitus without complication ([place])   . Dyslipidemia   . GERD (gastroesophageal reflux disease)   . Hypertension   . Peripheral vascular disease [facility])     Past Surgical History:  Procedure Laterality Date  . ABDOMINAL AORTOGRAM W/LOWER EXTREMITY Left 12/27/2017   Procedure: ABDOMINAL AORTOGRAM W/LOWER EXTREMITY;  Surgeon: Algernon Huxley, MD;  Location: [place] [facility];  Service: Cardiovascular;  Laterality: Left;  . ABDOMINAL HYSTERECTOMY    . AMPUTATION TOE Left 12/23/2017   Procedure: AMPUTATION TOE LEFT GREAT TOE;  Surgeon: Sharlotte Alamo, DPM;  Location: [facility];  Service: Podiatry;  Laterality: Left;  . ENDARTERECTOMY FEMORAL Left 09/08/2017   Procedure: ENDARTERECTOMY FEMORAL WITH CORMATRIX PATCH;  Surgeon: Algernon Huxley, MD;  Location: [facility] OR[facility];  Service: Vascular;  Laterality: Left;  . EYE SURGERY Bilateral    Cataract Extraction with IOL  . LOWER EXTREMITY ANGIOGRAPHY Left 07/22/2017   Procedure: Lower Extremity Angiography;  Surgeon: Algernon Huxley, MD;  Location: [place] [facility];  Service: Cardiovascular;  Laterality: Left;  . LOWER EXTREMITY ANGIOGRAPHY Left 08/16/2017   Procedure: Lower Extremity Angiography;  Surgeon: Algernon Huxley, MD;  Location: [place] [facility];  Service: Cardiovascular;  Laterality: Left;  Marland Kitchen MULTIPLE TOOTH  EXTRACTIONS      Social History Social History       Tobacco Use  . Smoking status: Never Smoker  . Smokeless tobacco: Never Used  Substance Use Topics  . Alcohol use: No  . Drug use: No     Family History No bleeding or clotting disorders, no aneurysms, no autoimmune diseases     No Known Allergies   REVIEW OF SYSTEMS (Negative unless checked)  Constitutional: _0 Weight loss  _1 Fever  _2 Chills Cardiac: _3 Chest pain   _4 Chest pressure   _5 Palpitations   _6 Shortness of breath when laying flat   _7 Shortness of breath at rest   _8 Shortness of breath with exertion. Vascular:  _9 Pain in legs with walking   _10 Pain in legs at rest   _11 Pain in legs when laying flat   _12 Claudication   _13 Pain in feet when walking  _14 Pain in feet at rest  _15 Pain in feet when laying flat   _16 History of DVT   _17 Phlebitis   _18 Swelling in legs   _19 Varicose veins   _20 Non-healing ulcers Pulmonary:   _21 Uses home oxygen   _22 Productive cough   _23 Hemoptysis   _24 Wheeze  _25 COPD   _26 Asthma Neurologic:  _27 Dizziness  _28 Blackouts   _29 Seizures   _30 History of stroke   _31 History of TIA  _32 Aphasia   _33 Temporary blindness   _34 Dysphagia   _35 Weakness or numbness in arms   _36 Weakness or numbness in legs Musculoskeletal:  _37 Arthritis   _38 Joint swelling   _39 Joint pain   _40 Low back pain Hematologic:  _41 Easy bruising  _42 Easy bleeding   _43 Hypercoagulable state   _44 Anemic   Gastrointestinal:  _45 Blood in stool   _46 Vomiting blood  _47 Gastroesophageal reflux/heartburn   _48 Abdominal pain Genitourinary:  _49 Chronic kidney disease   _50 Difficult urination  _51 Frequent urination  _52 Burning with urination   _53 Hematuria Skin:  _54 Rashes   _55 Ulcers   _56 Wounds Psychological:  _57 History of anxiety   _58  History of major depression.    Physical Examination  BP 135/72 (BP Location: Right Arm, Patient Position: Sitting)   Pulse 99   Resp 17   Ht 5' 6.5" (1.689 m)   Wt 52.2 kg (115 lb)   BMI 18.28 kg/m  Gen:  WD/WN, NAD. Appears younger  than stated age. Head: [place]/AT, No temporalis wasting. Ear/Nose/Throat: Hearing somewhat decreased, nares w/o erythema or drainage, trachea midline Eyes: Conjunctiva clear. Sclera non-icteric Neck: Supple.  No JVD.  Pulmonary:  Good air movement, no use of accessory muscles.  Cardiac: RRR, normal S1, S2 Vascular:  Vessel Right Left  Radial Palpable Palpable                          PT 1+ Palpable 1+ Palpable  DP Not Palpable Trace Palpable    Musculoskeletal: M/S 5/5 throughout.  Left leg wounds dressed. No edema.  Neurologic: Sensation grossly intact in extremities.  Symmetrical.  Speech is fluent.  Psychiatric: Judgment intact, Mood & affect appropriate for pt's clinical situation. Dermatologic: left leg wounds dressed       Labs Recent Results (from the past 2160 hour(s))  Lactic acid, plasma     Status: Abnormal   Collection Time: 12/23/17  2:32 PM  Result Value Ref Range   Lactic Acid, Venous 2.0 (HH) 0.5 - 1.9 mmol/L    Comment: CRITICAL RESULT CALLED TO, READ BACK BY AND VERIFIED WITH SUSAN NEAL AT 1515 12/23/17 DAS Performed at [facility], [address] [place], [place] [address]   Comprehensive metabolic panel     Status: Abnormal   Collection Time: 12/23/17  2:32 PM  Result Value Ref Range   Sodium 137 135 - 145 mmol/L   Potassium 4.2 3.5 - 5.1 mmol/L   Chloride 102 101 - 111 mmol/L   CO2 25 22 - 32 mmol/L   Glucose, Bld 112 (H) 65 - 99 mg/dL   BUN 14 6 - 20 mg/dL   Creatinine, Ser 1.01 (H) 0.44 - 1.00 mg/dL   Calcium 9.1 8.9 - 10.3 mg/dL   Total Protein 9.7 (H) 6.5 - 8.1 g/dL   Albumin 2.9 (L) 3.5 - 5.0 g/dL   AST 27 15 - 41 U/L   ALT 12 (L) 14 - 54 U/L   Alkaline Phosphatase 68 38 - 126 U/L   Total Bilirubin 0.5 0.3 - 1.2 mg/dL   GFR calc non Af Amer 48 (L) >60 mL/min   GFR calc Af Amer 55 (L) >60 mL/min    Comment: (NOTE) The eGFR has been calculated using the CKD EPI equation. This calculation has not been validated in all clinical  situations. eGFR's persistently <60 mL/min signify possible Chronic Kidney Disease.    Anion gap 10 5 - 15    Comment: Performed at [facility], [address] [place], [place] [address]  CBC with Differential     Status: Abnormal   Collection Time: 12/23/17  2:32 PM  Result Value Ref Range   WBC 17.7 (H) 3.6 - 11.0 K/uL   RBC 3.36 (L) 3.80 - 5.20 MIL/uL   Hemoglobin 7.9 (L) 12.0 - 16.0 g/dL   HCT 24.7 (L) 35.0 - 47.0 %   MCV 73.4 (L) 80.0 - 100.0 fL   MCH 23.6 (L) 26.0 - 34.0 pg   MCHC 32.1 32.0 - 36.0 g/dL   RDW 18.9 (H) 11.5 - 14.5 %   Platelets 509 (H) 150 - 440 K/uL   Neutrophils Relative % 81 %   Neutro Abs 14.4 (H) 1.4 - 6.5 K/uL   Lymphocytes Relative 8 %   Lymphs Abs 1.4 1.0 - 3.6 K/uL   Monocytes Relative 10 %   Monocytes Absolute 1.8 (H) 0.2 - 0.9 K/uL   Eosinophils Relative 1 %   Eosinophils Absolute 0.1 0 - 0.7 K/uL   Basophils Relative 0 %   Basophils Absolute 0.0 0 - 0.1 K/uL    Comment: Performed at [facility], [address] [place], [place] [address]  Blood Culture (routine x 2)     Status: None   Collection Time: 12/23/17  3:31 PM  Result Value Ref Range   Specimen Description      BLOOD Blood Culture results may not be optimal due to an inadequate volume of blood received in culture bottles   Special Requests BLOOD RIGHT FOREARM    Culture  NO GROWTH 5 DAYS Performed at [facility], [address] [place], [place] [address]    Report Status 12/28/2017 FINAL   Blood Culture (routine x 2)     Status: None   Collection Time: 12/23/17  3:32 PM  Result Value Ref Range   Specimen Description BLOOD Blood Culture adequate volume    Special Requests LEFT ANTECUBITAL    Culture      NO GROWTH 5 DAYS Performed at [facility], [address] [place], [place] [address]    Report Status 12/28/2017 FINAL   Surgical pcr screen     Status: Abnormal   Collection Time: 12/23/17  6:36 PM  Result Value Ref Range     MRSA, PCR POSITIVE (A) NEGATIVE    Comment: RESULT CALLED TO, READ BACK BY AND VERIFIED WITH: THERESA COBLE ON 12/23/17 AT 2200 JAG    Staphylococcus aureus POSITIVE (A) NEGATIVE    Comment: (NOTE) The Xpert SA Assay (FDA approved for NASAL specimens in patients 37 years of age and older), is one component of a comprehensive surveillance program. It is not intended to diagnose infection nor to guide or monitor treatment. Performed at [facility], [address] [place], [place] [address]   Urinalysis, Complete w Microscopic     Status: Abnormal   Collection Time: 12/23/17  8:20 PM  Result Value Ref Range   Color, Urine STRAW (A) YELLOW   APPearance CLEAR (A) CLEAR   Specific Gravity, Urine 1.009 1.005 - 1.030   pH 7.0 5.0 - 8.0   Glucose, UA NEGATIVE NEGATIVE mg/dL   Hgb urine dipstick NEGATIVE NEGATIVE   Bilirubin Urine NEGATIVE NEGATIVE   Ketones, ur NEGATIVE NEGATIVE mg/dL   Protein, ur NEGATIVE NEGATIVE mg/dL   Nitrite NEGATIVE NEGATIVE   Leukocytes, UA NEGATIVE NEGATIVE   RBC / HPF 0-5 0 - 5 RBC/hpf   WBC, UA 0-5 0 - 5 WBC/hpf   Bacteria, UA NONE SEEN NONE SEEN   Squamous Epithelial / LPF 0-5 (A) NONE SEEN    Comment: Performed at [facility], [address], [place], [place] [address]  Aerobic/Anaerobic Culture (surgical/deep wound)     Status: None   Collection Time: 12/23/17 10:47 PM  Result Value Ref Range   Specimen Description      TOE LEFT GREAT TOE ULCER Performed at [facility], [address] [place], [place] [address]    Special Requests      NONE Performed at [facility], [address] [place], [place] [address]    Gram Stain      FEW WBC PRESENT, PREDOMINANTLY PMN ABUNDANT GRAM POSITIVE COCCI IN CHAINS RARE GRAM NEGATIVE RODS    Culture      MODERATE GROUP B STREP(S.AGALACTIAE)ISOLATED FEW STAPHYLOCOCCUS AUREUS MODERATE FINEGOLDIA MAGNA ORGANISM 1 TESTING AGAINST S. AGALACTIAE NOT ROUTINELY  PERFORMED DUE TO PREDICTABILITY OF AMP/PEN/VAN SUSCEPTIBILITY. Performed at [facility], [place] [address] [place], [place] [address]    Report Status 12/29/2017 FINAL    Organism ID, Bacteria STAPHYLOCOCCUS AUREUS       Susceptibility   Staphylococcus aureus - MIC*    CIPROFLOXACIN 4 RESISTANT Resistant     ERYTHROMYCIN <=0.25 SENSITIVE Sensitive     GENTAMICIN <=0.5 SENSITIVE Sensitive     OXACILLIN RESISTANT Resistant     TETRACYCLINE <=1 SENSITIVE Sensitive     VANCOMYCIN <=0.5 SENSITIVE Sensitive     TRIMETH/SULFA <=10 SENSITIVE Sensitive     CLINDAMYCIN <=0.25 SENSITIVE Sensitive  RIFAMPIN <=0.5 SENSITIVE Sensitive     Inducible Clindamycin NEGATIVE Sensitive     * FEW STAPHYLOCOCCUS AUREUS  Surgical pathology     Status: None   Collection Time: 12/23/17 11:04 PM  Result Value Ref Range   SURGICAL PATHOLOGY      Surgical Pathology CASE: [id number] PATIENT: [facility] Surgical Pathology Report     SPECIMEN SUBMITTED: A. Great toe, left  CLINICAL HISTORY: None provided  PRE-OPERATIVE DIAGNOSIS: Gas in tissue, left great toe ulcer  POST-OPERATIVE DIAGNOSIS: Same as pre-op     DIAGNOSIS: A. GREAT TOE, LEFT; AMPUTATION: - SKIN AND SOFT TISSUE WITH CHRONIC ULCERATION AND UNDERLYING OSTEOMYELITIS. - VIABLE MARGINS OF RESECTION.   GROSS DESCRIPTION:  A. Labeled: left great toe  Size: 6.3 x 2.7 x 2.6cm  Description of lesion(s): 1.4 x 1.3 cm ulcer on the plantar aspect in the entire digit is discolored green to black with sloughing skin  Proximal margin: skin discoloration extends to margin, margin inked blue  Bone: bone at margin is a smooth articular surface with some yellow chalky white areas  Other findings: received in formalin  Block summary: 1-perpendicular skin margin 2-en face bone at margin following decalcification 3-representative ul cer with underlying bone following decalcification   Final Diagnosis performed by  Quay Burow, MD.  Electronically signed 12/27/2017 3:04:54PM    The electronic signature indicates that the named Attending Pathologist has evaluated the specimen  Technical component performed at [facility], [address] [place], [place] [address] Lab: [phone] Dir: Rush Farmer, MD, MMM  Professional component performed at [facility], [address], [place], [place] [address] Lab: [phone] Dir: Dellia Nims. Rubinas, MD    Glucose, capillary     Status: Abnormal   Collection Time: 12/24/17 12:41 AM  Result Value Ref Range   Glucose-Capillary 139 (H) 65 - 99 mg/dL   Comment 1 Notify RN   Basic metabolic panel     Status: Abnormal   Collection Time: 12/24/17  3:58 AM  Result Value Ref Range   Sodium 136 135 - 145 mmol/L   Potassium 3.9 3.5 - 5.1 mmol/L   Chloride 104 101 - 111 mmol/L   CO2 24 22 - 32 mmol/L   Glucose, Bld 200 (H) 65 - 99 mg/dL   BUN 12 6 - 20 mg/dL   Creatinine, Ser 0.86 0.44 - 1.00 mg/dL   Calcium 8.3 (L) 8.9 - 10.3 mg/dL   GFR calc non Af Amer 58 (L) >60 mL/min   GFR calc Af Amer >60 >60 mL/min    Comment: (NOTE) The eGFR has been calculated using the CKD EPI equation. This calculation has not been validated in all clinical situations. eGFR's persistently <60 mL/min signify possible Chronic Kidney Disease.    Anion gap 8 5 - 15    Comment: Performed at [facility], [address] [place], [place] [address]  CBC     Status: Abnormal   Collection Time: 12/24/17  3:58 AM  Result Value Ref Range   WBC 16.5 (H) 3.6 - 11.0 K/uL   RBC 3.11 (L) 3.80 - 5.20 MIL/uL   Hemoglobin 7.1 (L) 12.0 - 16.0 g/dL   HCT 22.5 (L) 35.0 - 47.0 %   MCV 72.5 (L) 80.0 - 100.0 fL   MCH 23.0 (L) 26.0 - 34.0 pg   MCHC 31.7 (L) 32.0 - 36.0 g/dL   RDW 18.7 (H) 11.5 - 14.5 %   Platelets 448 (H) 150 - 440 K/uL  Comment: Performed at [facility], [address] [place], [place] [address]  Glucose, capillary     Status:  Abnormal   Collection Time: 12/24/17  7:30 AM  Result Value Ref Range   Glucose-Capillary 154 (H) 65 - 99 mg/dL  Glucose, capillary     Status: Abnormal   Collection Time: 12/24/17 12:20 PM  Result Value Ref Range   Glucose-Capillary 171 (H) 65 - 99 mg/dL  Prepare RBC     Status: None   Collection Time: 12/24/17  2:49 PM  Result Value Ref Range   Order Confirmation      ORDER PROCESSED BY BLOOD BANK Performed at [facility], [address] [place], [place] [address]   Type and screen     Status: None   Collection Time: 12/24/17  3:18 PM  Result Value Ref Range   ABO/RH(D) B POS    Antibody Screen NEG    Sample Expiration 12/27/2017    Unit Number [id number]    Blood Component Type RED CELLS,LR    Unit division 00    Status of Unit ISSUED,FINAL    Transfusion Status OK TO TRANSFUSE    Crossmatch Result      Compatible Performed at [facility], [address] [place], [place] [address]   BPAM RBC     Status: None   Collection Time: 12/24/17  3:18 PM  Result Value Ref Range   ISSUE DATE / TIME [id number]    Blood Product Unit Number [id number]    PRODUCT CODE [id number]    Unit Type and Rh 7300    Blood Product Expiration Date [id number]   Glucose, capillary     Status: Abnormal   Collection Time: 12/24/17  4:42 PM  Result Value Ref Range   Glucose-Capillary 221 (H) 65 - 99 mg/dL   Comment 1 Notify RN   Glucose, capillary     Status: Abnormal   Collection Time: 12/24/17  9:10 PM  Result Value Ref Range   Glucose-Capillary 210 (H) 65 - 99 mg/dL  Hemoglobin     Status: Abnormal   Collection Time: 12/24/17  9:23 PM  Result Value Ref Range   Hemoglobin 8.2 (L) 12.0 - 16.0 g/dL    Comment: Performed at [facility], [address] [place], [place] [address]  Glucose, capillary     Status: Abnormal   Collection Time: 12/25/17  8:02 AM  Result Value Ref Range   Glucose-Capillary 123 (H) 65 - 99 mg/dL  Glucose, capillary      Status: Abnormal   Collection Time: 12/25/17 12:11 PM  Result Value Ref Range   Glucose-Capillary 118 (H) 65 - 99 mg/dL  Glucose, capillary     Status: Abnormal   Collection Time: 12/25/17  4:56 PM  Result Value Ref Range   Glucose-Capillary 205 (H) 65 - 99 mg/dL  Glucose, capillary     Status: Abnormal   Collection Time: 12/25/17  9:00 PM  Result Value Ref Range   Glucose-Capillary 106 (H) 65 - 99 mg/dL  Glucose, capillary     Status: Abnormal   Collection Time: 12/26/17  7:42 AM  Result Value Ref Range   Glucose-Capillary 114 (H) 65 - 99 mg/dL   Comment 1 Notify RN   CBC with Differential/Platelet     Status: Abnormal   Collection Time: 12/26/17  9:38 AM  Result Value Ref Range   WBC 13.4 (H) 3.6 - 11.0 K/uL   RBC 3.92 3.80 - 5.20 MIL/uL  Hemoglobin 9.6 (L) 12.0 - 16.0 g/dL   HCT 29.4 (L) 35.0 - 47.0 %   MCV 74.9 (L) 80.0 - 100.0 fL   MCH 24.6 (L) 26.0 - 34.0 pg   MCHC 32.8 32.0 - 36.0 g/dL   RDW 21.3 (H) 11.5 - 14.5 %   Platelets 550 (H) 150 - 440 K/uL   Neutrophils Relative % 73 %   Neutro Abs 9.9 (H) 1.4 - 6.5 K/uL   Lymphocytes Relative 14 %   Lymphs Abs 1.9 1.0 - 3.6 K/uL   Monocytes Relative 10 %   Monocytes Absolute 1.3 (H) 0.2 - 0.9 K/uL   Eosinophils Relative 2 %   Eosinophils Absolute 0.2 0 - 0.7 K/uL   Basophils Relative 1 %   Basophils Absolute 0.1 0 - 0.1 K/uL    Comment: Performed at [facility], [address] [place], [place] [address]  Comprehensive metabolic panel     Status: Abnormal   Collection Time: 12/26/17  9:38 AM  Result Value Ref Range   Sodium 138 135 - 145 mmol/L   Potassium 3.2 (L) 3.5 - 5.1 mmol/L   Chloride 103 101 - 111 mmol/L   CO2 27 22 - 32 mmol/L   Glucose, Bld 120 (H) 65 - 99 mg/dL   BUN 12 6 - 20 mg/dL   Creatinine, Ser 1.04 (H) 0.44 - 1.00 mg/dL   Calcium 8.8 (L) 8.9 - 10.3 mg/dL   Total Protein 8.4 (H) 6.5 - 8.1 g/dL   Albumin 2.5 (L) 3.5 - 5.0 g/dL   AST 20 15 - 41 U/L   ALT 10 (L) 14 - 54 U/L   Alkaline  Phosphatase 59 38 - 126 U/L   Total Bilirubin 0.6 0.3 - 1.2 mg/dL   GFR calc non Af Amer 46 (L) >60 mL/min   GFR calc Af Amer 54 (L) >60 mL/min    Comment: (NOTE) The eGFR has been calculated using the CKD EPI equation. This calculation has not been validated in all clinical situations. eGFR's persistently <60 mL/min signify possible Chronic Kidney Disease.    Anion gap 8 5 - 15    Comment: Performed at [facility], [address] [place],  [address]  Glucose, capillary     Status: Abnormal   Collection Time: 12/26/17 12:33 PM  Result Value Ref Range   Glucose-Capillary 251 (H) 65 - 99 mg/dL   Comment 1 Notify RN   Glucose, capillary     Status: Abnormal   Collection Time: 12/26/17  4:14 PM  Result Value Ref Range   Glucose-Capillary 49 (L) 65 - 99 mg/dL   Comment 1 Notify RN   Glucose, capillary     Status: Abnormal   Collection Time: 12/26/17  4:49 PM  Result Value Ref Range   Glucose-Capillary 120 (H) 65 - 99 mg/dL   Comment 1 Notify RN   Glucose, capillary     Status: Abnormal   Collection Time: 12/26/17  9:47 PM  Result Value Ref Range   Glucose-Capillary 209 (H) 65 - 99 mg/dL  CBC     Status: Abnormal   Collection Time: 12/27/17  5:33 AM  Result Value Ref Range   WBC 12.4 (H) 3.6 - 11.0 K/uL   RBC 3.68 (L) 3.80 - 5.20 MIL/uL   Hemoglobin 8.9 (L) 12.0 - 16.0 g/dL   HCT 27.6 (L) 35.0 - 47.0 %   MCV 74.9 (L) 80.0 - 100.0 fL   MCH 24.3 (L) 26.0 - 34.0  pg   MCHC 32.4 32.0 - 36.0 g/dL   RDW 21.3 (H) 11.5 - 14.5 %   Platelets 600 (H) 150 - 440 K/uL    Comment: Performed at [facility], [address] [place], [place] [address]  Comprehensive metabolic panel     Status: Abnormal   Collection Time: 12/27/17  5:33 AM  Result Value Ref Range   Sodium 139 135 - 145 mmol/L   Potassium 3.0 (L) 3.5 - 5.1 mmol/L   Chloride 102 101 - 111 mmol/L   CO2 26 22 - 32 mmol/L   Glucose, Bld 124 (H) 65 - 99 mg/dL   BUN 12 6 - 20 mg/dL   Creatinine, Ser  0.97 0.44 - 1.00 mg/dL   Calcium 8.5 (L) 8.9 - 10.3 mg/dL   Total Protein 8.3 (H) 6.5 - 8.1 g/dL   Albumin 2.3 (L) 3.5 - 5.0 g/dL   AST 16 15 - 41 U/L   ALT 9 (L) 14 - 54 U/L   Alkaline Phosphatase 51 38 - 126 U/L   Total Bilirubin 0.6 0.3 - 1.2 mg/dL   GFR calc non Af Amer 50 (L) >60 mL/min   GFR calc Af Amer 58 (L) >60 mL/min    Comment: (NOTE) The eGFR has been calculated using the CKD EPI equation. This calculation has not been validated in all clinical situations. eGFR's persistently <60 mL/min signify possible Chronic Kidney Disease.    Anion gap 11 5 - 15    Comment: Performed at [facility], [address] [place], [place] [address]  Magnesium     Status: Abnormal   Collection Time: 12/27/17  5:33 AM  Result Value Ref Range   Magnesium 1.6 (L) 1.7 - 2.4 mg/dL    Comment: Performed at [facility], [address] [place], [place] [address]  Glucose, capillary     Status: Abnormal   Collection Time: 12/27/17  7:57 AM  Result Value Ref Range   Glucose-Capillary 129 (H) 65 - 99 mg/dL  Vancomycin, trough     Status: None   Collection Time: 12/27/17  9:45 AM  Result Value Ref Range   Vancomycin Tr 20 15 - 20 ug/mL    Comment: Performed at [facility], [address] [place], [place] [address]  Glucose, capillary     Status: Abnormal   Collection Time: 12/27/17 12:10 PM  Result Value Ref Range   Glucose-Capillary 109 (H) 65 - 99 mg/dL  Glucose, capillary     Status: Abnormal   Collection Time: 12/27/17  9:26 PM  Result Value Ref Range   Glucose-Capillary 255 (H) 65 - 99 mg/dL   Comment 1 Notify RN   Glucose, capillary     Status: Abnormal   Collection Time: 12/28/17  8:05 AM  Result Value Ref Range   Glucose-Capillary 121 (H) 65 - 99 mg/dL  Vancomycin, trough     Status: Abnormal   Collection Time: 12/28/17  8:47 AM  Result Value Ref Range   Vancomycin Tr 14 (L) 15 - 20 ug/mL    Comment: Performed at [facility], [address], [place], [place] [address]  Potassium     Status: Abnormal   Collection Time: 12/28/17  8:47 AM  Result Value Ref Range   Potassium 2.9 (L) 3.5 - 5.1 mmol/L    Comment: Performed at [facility], [address], [place], [place] [address]    Radiology Dg Toe Great Left  Result Date: 12/23/2017  CLINICAL DATA:  Necrosis at the great toe. Infection. Diabetic ulcer. EXAM: LEFT GREAT TOE COMPARISON:  None. FINDINGS: Gas is present in the medial and lateral aspect of great toe. No radiopaque foreign body is present. There is no osseous erosion. Mild generalized osteopenia is present. IMPRESSION: 1. Gas within the soft tissues at the IP joint of the great toe is likely associated to the plantar ulceration. This is most concerning for soft tissue infection. 2. No significant osseous abnormality associated with the infection. Electronically Signed   By: San Morelle M.D.   On: 12/23/2017 15:00     Assessment/Plan Diabetes mellitus without complication blood glucose control important in reducing the progression of atherosclerotic disease. Also, involved in wound healing. On appropriate medications.   Hypertension blood pressure control important in reducing the progression of atherosclerotic disease. On appropriate oral medications.   Atherosclerosis of native arteries of the extremities with ulceration ([place])   Her ABIs today are slightly improved on the right upper 0.69 and significantly improved on the left up to 0.81. Overall she seems to be doing better.  We will need to keep a close eye on her and I will see her back in about 2-3 months with ABIs.    Leotis Pain, MD  01/04/2018 4:17 PM    This note was created with Dragon medical transcription system.  Any errors from dictation are purely unintentional

## 2018-01-04 NOTE — Patient Instructions (Signed)
Peripheral Vascular Disease Peripheral vascular disease (PVD) is a disease of the blood vessels that are not part of your heart and brain. A simple term for PVD is poor circulation. In most cases, PVD narrows the blood vessels that carry blood from your heart to the rest of your body. This can result in a decreased supply of blood to your arms, legs, and internal organs, like your stomach or kidneys. However, it most often affects a person's lower legs and feet. There are two types of PVD.  Organic PVD. This is the more common type. It is caused by damage to the structure of blood vessels.  Functional PVD. This is caused by conditions that make blood vessels contract and tighten (spasm).  Without treatment, PVD tends to get worse over time. PVD can also lead to acute ischemic limb. This is when an arm or limb suddenly has trouble getting enough blood. This is a medical emergency. What are the causes? Each type of PVD has many different causes. The most common cause of PVD is buildup of a fatty material (plaque) inside of your arteries (atherosclerosis). Small amounts of plaque can break off from the walls of the blood vessels and become lodged in a smaller artery. This blocks blood flow and can cause acute ischemic limb. Other common causes of PVD include:  Blood clots that form inside of blood vessels.  Injuries to blood vessels.  Diseases that cause inflammation of blood vessels or cause blood vessel spasms.  Health behaviors and health history that increase your risk of developing PVD.  What increases the risk? You may have a greater risk of PVD if you:  Have a family history of PVD.  Have certain medical conditions, including: ? High cholesterol. ? Diabetes. ? High blood pressure (hypertension). ? Coronary heart disease. ? Past problems with blood clots. ? Past injury, such as burns or a broken bone. These may have damaged blood vessels in your limbs. ? Buerger disease. This is  caused by inflamed blood vessels in your hands and feet. ? Some forms of arthritis. ? Rare birth defects that affect the arteries in your legs.  Use tobacco.  Do not get enough exercise.  Are obese.  Are age 50 or older.  What are the signs or symptoms? PVD may cause many different symptoms. Your symptoms depend on what part of your body is not getting enough blood. Some common signs and symptoms include:  Cramps in your lower legs. This may be a symptom of poor leg circulation (claudication).  Pain and weakness in your legs while you are physically active that goes away when you rest (intermittent claudication).  Leg pain when at rest.  Leg numbness, tingling, or weakness.  Coldness in a leg or foot, especially when compared with the other leg.  Skin or hair changes. These can include: ? Hair loss. ? Shiny skin. ? Pale or bluish skin. ? Thick toenails.  Inability to get or maintain an erection (erectile dysfunction).  People with PVD are more prone to developing ulcers and sores on their toes, feet, or legs. These may take longer than normal to heal. How is this diagnosed? Your health care provider may diagnose PVD from your signs and symptoms. The health care provider will also do a physical exam. You may have tests to find out what is causing your PVD and determine its severity. Tests may include:  Blood pressure recordings from your arms and legs and measurements of the strength of your pulses (  pulse volume recordings).  Imaging studies using sound waves to take pictures of the blood flow through your blood vessels (Doppler ultrasound).  Injecting a dye into your blood vessels before having imaging studies using: ? X-rays (angiogram or arteriogram). ? Computer-generated X-rays (CT angiogram). ? A powerful electromagnetic field and a computer (magnetic resonance angiogram or MRA).  How is this treated? Treatment for PVD depends on the cause of your condition and the  severity of your symptoms. It also depends on your age. Underlying causes need to be treated and controlled. These include long-lasting (chronic) conditions, such as diabetes, high cholesterol, and high blood pressure. You may need to first try making lifestyle changes and taking medicines. Surgery may be needed if these do not work. Lifestyle changes may include:  Quitting smoking.  Exercising regularly.  Following a low-fat, low-cholesterol diet.  Medicines may include:  Blood thinners to prevent blood clots.  Medicines to improve blood flow.  Medicines to improve your blood cholesterol levels.  Surgical procedures may include:  A procedure that uses an inflated balloon to open a blocked artery and improve blood flow (angioplasty).  A procedure to put in a tube (stent) to keep a blocked artery open (stent implant).  Surgery to reroute blood flow around a blocked artery (peripheral bypass surgery).  Surgery to remove dead tissue from an infected wound on the affected limb.  Amputation. This is surgical removal of the affected limb. This may be necessary in cases of acute ischemic limb that are not improved through medical or surgical treatments.  Follow these instructions at home:  Take medicines only as directed by your health care provider.  Do not use any tobacco products, including cigarettes, chewing tobacco, or electronic cigarettes. If you need help quitting, ask your health care provider.  Lose weight if you are overweight, and maintain a healthy weight as directed by your health care provider.  Eat a diet that is low in fat and cholesterol. If you need help, ask your health care provider.  Exercise regularly. Ask your health care provider to suggest some good activities for you.  Use compression stockings or other mechanical devices as directed by your health care provider.  Take good care of your feet. ? Wear comfortable shoes that fit well. ? Check your feet  often for any cuts or sores. Contact a health care provider if:  You have cramps in your legs while walking.  You have leg pain when you are at rest.  You have coldness in a leg or foot.  Your skin changes.  You have erectile dysfunction.  You have cuts or sores on your feet that are not healing. Get help right away if:  Your arm or leg turns cold and blue.  Your arms or legs become red, warm, swollen, painful, or numb.  You have chest pain or trouble breathing.  You suddenly have weakness in your face, arm, or leg.  You become very confused or lose the ability to speak.  You suddenly have a very bad headache or lose your vision. This information is not intended to replace advice given to you by your health care provider. Make sure you discuss any questions you have with your health care provider. Document Released: 12/24/2004 Document Revised: 04/23/2016 Document Reviewed: 04/26/2014 [employer] Interactive Patient Education  2017 [employer]  

## 2018-01-07 ENCOUNTER — Encounter: Payer: Medicare Other | Admitting: Physician Assistant

## 2018-01-07 DIAGNOSIS — E11622 Type 2 diabetes mellitus with other skin ulcer: Secondary | ICD-10-CM | POA: Diagnosis not present

## 2018-01-10 NOTE — Progress Notes (Addendum)
[name], [name] ([id number]) Visit Report for 01/07/2018 Chief Complaint Document Details Patient Name: [name], [name] Date of Service: 01/07/2018 1:30 PM Medical Record Number: [id number] Patient Account Number: [id number] Date of Birth/Sex: Jul 01, 1926 (84 y.o. Female) Treating RN: Cornell Barman Primary Care Provider: Lucianne Lei Other Clinician: Referring Provider: Lucianne Lei Treating Provider/Extender: Melburn Hake, HOYT Weeks in Treatment: 32 Information Obtained from: Patient Chief Complaint Patients presents for follow up for left lower leg ulcer Electronic Signature(s) Signed: 01/10/2018 9:06:28 AM By: Worthy Keeler PA-C Entered By: Worthy Keeler on 01/07/2018 13:58:18 Lamy, [name] ([id number]) -------------------------------------------------------------------------------- Debridement Details Patient Name: Alexa Lowes P. Date of Service: 01/07/2018 1:30 PM Medical Record Number: [id number] Patient Account Number: [id number] Date of Birth/Sex: Nov 05, 1926 (84 y.o. Female) Treating RN: Montey Hora Primary Care Provider: Lucianne Lei Other Clinician: Referring Provider: Lucianne Lei Treating Provider/Extender: Melburn Hake, HOYT Weeks in Treatment: 32 Debridement Performed for Wound #1 Left,Anterior Lower Leg Assessment: Performed By: Physician STONE III, HOYT E., PA-C Debridement: Debridement Severity of Tissue Pre Muscle involvement without necrosis Debridement: Pre-procedure Verification/Time Yes - 14:32 Out Taken: Start Time: 14:33 Pain Control: Other : lidocaine 4% Level: Skin/Subcutaneous Tissue Total Area Debrided (L x W): 6 (cm) x 2.5 (cm) = 15 (cm) Tissue and other material Viable, Non-Viable, Fibrin/Slough, Subcutaneous debrided: Instrument: Curette Bleeding: Moderate Hemostasis Achieved: Pressure End Time: 14:35 Procedural Pain: 0 Post Procedural Pain: 0 Response to Treatment: Procedure was tolerated well Post Debridement  Measurements of Total Wound Length: (cm) 6 Width: (cm) 2.5 Depth: (cm) 0.2 Volume: (cm) 2.356 Character of Wound/Ulcer Post Debridement: Stable Severity of Tissue Post Debridement: Muscle involvement without necrosis Post Procedure Diagnosis Same as Pre-procedure Electronic Signature(s) Signed: 01/24/2018 12:01:55 PM By: Montey Hora Signed: 02/16/2018 3:36:20 AM By: Worthy Keeler PA-C Previous Signature: 01/10/2018 9:06:28 AM Version By: Worthy Keeler PA-C Previous Signature: 01/10/2018 11:05:32 AM Version By: Gretta Cool, BSN, RN, CWS, Kim RN, BSN Entered By: Montey Hora on 01/24/2018 12:01:55 [name], [name] ([id number]) -------------------------------------------------------------------------------- HPI Details Patient Name: Alexa Lowes P. Date of Service: 01/07/2018 1:30 PM Medical Record Number: [id number] Patient Account Number: [id number] Date of Birth/Sex: 05/27/1926 (84 y.o. Female) Treating RN: Cornell Barman Primary Care Provider: Lucianne Lei Other Clinician: Referring Provider: Lucianne Lei Treating Provider/Extender: Melburn Hake, HOYT Weeks in Treatment: 39 History of Present Illness HPI Description: 84 year old patient was seen in the ED about 10 days ago for a history of abrasion to the left lower extremity while she was boarding a bus and scraped the anterior part of her left leg. She has been a diabetic for many years and has been taking treatment regularly. Her past medical history is also significant for anemia arthritis constipation and hypertension and is status post abdominal hysterectomy. She has never been a smoker. after her ER visit she was advised to apply Bactroban ointment to the wound twice a day and continue to monitor her blood glucose levels. her last hemoglobin A1c was done 3 years ago and was 6.6% 06/04/17 patient left anterior shin wound appears to be doing well although it is still somewhat dry despite the treatment with Medihoney. We have been  using Kerlex following the Medihoney application and I believe this is just not retaining that much moisture. Nonetheless there is no evidence of infection. 06/11/17 on evaluation today patient appears to be doing a little bit worse in regard to her wound. The entirety of the wound is dry and unfortunately the Medihoney does not seem to be helping this. That  is even with the dressing changes that I made last week to try to retain more moisture. She has not tried [employer] as of yet. She was also noncompressible when testing for ABIs. 07/01/2017 -- she had a arterial studies done and was seen by Dr. Lucky Cowboy. her noninvasive studies showed noncompressible vessels on the right but brisk waveforms and digital pressures of 71 on the right consistent with only mild arterial insufficienc. On the left her ABI was 0.47 and this may be falsely elevated due to calcification. No digital pressures were obtained on the left and this is consistent with severe arterial insufficiency. this critical limb threatening situation on the left, would make wound healing difficult and it represents a serious situation and he has recommended an angiography with possible revascularization. The patient desired to defer the decision to she discuss with the family members. 07/09/17 on evaluation today patient appears to be doing fairly well in regard to her left anterior lower extremity wound. She has been tolerating the dressing changes without complication we have been utilizing Santyl at this time. She tells me that she is having no significant pain compared to what she has had in the past. She has decided after discussing with family that she is going to go forward with the surgery with Dr. dew. This is to restore better blood flow to left lower extremity. With that it does appear that her lower extremity wound is making some progress albeit slow. No fevers, chills, nausea, or vomiting noted at this time. 07/16/17 on evaluation today  patient's wound appears to be doing roughly about the same although some of the slough is clearing off. [name] she has her appointment with vascular next week on Thursday the 23rd. With that being said her wound does not appear to be hurting as badly as it has in the past which is good news. No fevers, chills, nausea, or vomiting noted at this time. 07/30/2017 -- she had surgery on 07/22/2017 --indications being nonhealing ulcer on the left leg with a noninvasive study showing marked reduction in ABI of less than 0.5 and no digital pressure on her left leg. she had a percutaneous transluminal angioplasty of the left common and external iliac arteries. She then had a stent placed to the left external iliac arteries and to the left common iliac artery. 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 The flow distally was poor and there were multiple areas of high-grade stenosis or occlusion of the distal SFA and the popliteal artery with only a diseased peroneal artery as runoff distally. She has a postop appointment on September 23 08/20/2017 -- she went back to the vascular office for an appointment on 08/12/2017 and bilateral ABIs were done which were notable for moderate right lower extremity arterial disease and unable to obtain left ankle brachial indices due to absent Doppler velocities in the posterior tibial artery and the anterior tibial artery. Monophasic waveforms to the left femoral and popliteal artery. this was worrisome for a failure of stents placed and lower extremity angiogram was recommended. as was DYAMOND, TOLOSA ([id number]) done on 08/16/2017 and the stents placed in the left eye Lex system had some mild narrowing at the iliac bifurcation and the leading edge of the stent proximally was poor wall apposition with stenosis in the 50-60% range in the right common iliac artery. There was also  occlusion of the common femoral artery and the  origin of the SFA and profunda femoris artery. Flow distally was poor but they appeared to be multiple areas of high-grade stenosis or occlusion in the distal SFA and popliteal artery with only a diseased peroneal artery. in another stent was placed and deployed. The femoral occlusion would need to be treated surgically. 08/27/17 on evaluation today patient appears to be doing about the same in regard to her left for extremity wound. Unfortunately secondary to her vascular status this is not a very well healing wound at this time. She has been tolerating the dressing changes she has some discomfort but mainly with cleansing of the wound not at other times. No fevers, chills, nausea, or vomiting noted at this time. 09/17/2017 -- the patient was recently discharged from the hospital where she was admitted between 09/08/2017 and 09/10/2017, when she had a procedure on her left femoral artery with the endarterectomy and a patch. she will be going back for review this coming week. 09/24/2017 -- was seen on 09/18/2017 in the ER, for bleeding from her wound which was debrided earlier during the day. At the time she was seen there was no active bleeding. A thrombin pad was placed over the area and she was asked to change the outside dressing but come back to the wound center as planned. she was also seen at the [facility] office for a review after her surgery and bilateral lower extremity ABI when compared to the previous ones showed minimal improvement of arterial blood flow. She was set up for a CT angiogram of the abdomen and pelvis and lower extremities to see the degree of peripheral arterial disease 10/28/2017 -- had a x-ray of the left tibia and fibula done on 10/07/2017 -- showed no bony acute abnormality she was reviewed by Dr. Corene Cornea dew on 10/26/2017, and he reviewed her CT angiogram and this demonstrated a widely patent left  femoral endarterectomy including the origins of the femoral artery. She also had moderate distal disease in the popliteal artery, distal SFA and tibial disease. Her peroneal artery is a dominant runoff distally.he felt she had adequate perfusion for healing the mid calf level and he is putting off all surgical intervention for now and is going to see her back in 2-3 months for noninvasive studies. She also has a new wound on the plantar aspect of the left big toe where she pulled a piece of skin and has caused a superficial ulceration 11/19/17 on evaluation today patient appears to be doing decently well at this point in time. It does appear that her Wound VAC has been approved as ordered by Dr. Con Memos however she has not received that as of yet. Nonetheless the wound bed in general appears to have minimal slough noted at this time in a fairly good amount of granulation although there is to be a noted in the center of the wound. No fevers, chills, nausea, or vomiting noted at this time. 12/02/17 on evaluation today patient appears to be doing about the same in regard to her left anterior shin wound. Unfortunately her left toe wound appears to still be doing about the same as far as the eschar covering. This has softened up a bit to the point that it is now loose and could have selective debridement which would allow for the Santyl to work better. Fortunately she is having no pain. 12/10/17 on evaluation today patient appears to be doing fairly well in regard to her left anterior shin wound and that  it at least appears to be stable. With that being said she does have really no significant healing at this point this wound seems to be mainly just maintaining. Subsequently patient also has the left toe wound which does not seem to be progressing. There is still nonviable tissue overlying the surface of the wound and this doesn't even seem to be loosening up very well at all. And patient has been using the  Santyl. I do think this may need selective debridement today. Fortunately there does not appear to be any evidence of infection although I am concerned about the possibility that patient's blood flow is not likely sufficient to be able to tolerate appropriate wound healing. This was discussed with patient today as well although obviously I would leave the determination up to vascular surgery. She will be seeing Dr. dew on February 5 for repeat vascular studies and then in office evaluation. 12/17/17 on evaluation today patient appears to be doing about the same in regard to her left shin and left great toe ulcers. She has been performing the dressing changes as previously recommended over the past week. With that being said unfortunately nothing seems to be changing much in regard to the dimensions of the wound and I do feel again that this may be related to vascular flow. Patient's daughter was present during the evaluation today and we did discuss this as well. I will be detailed in greater detail in the plan. FAE, BLOSSOM ([id number]) 12/23/17 she is here in follow up evaluation of a left great toe and left anterior leg ulcer. There is noted ischemic changes (urple, dry gangrene, shriveling effect laterally) to the left great toe, although not overtly cold. Contact was made to Dr Ozella Almond office regarding possibility of seeing her in the office today or tomorrow as this is an acute change; they recommended she go to the emergency department for evaluation by Dr Ozella Almond partner Dr Curley Spice. The ER was contacted, spoke with Tiffany, regarding this. Patient was accompanied by her son. The left anterior shin ulcer is improved in measurement, overtly exposed bone with area of discoloration/necrosis. will follow up after hospitalization/vascular intervention 12/31/17 on evaluation today patient is status post having gone to the ER last week on 12/23/17. 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 This was performed by Dr. Cleda Mccreedy on 12/23/17. Subsequently Dr. Corene Cornea do with [facility] specialist performed percutaneous evaluation and intervention of patient's left lower extremity on 12/27/17. Angioplasty was performed following angiogram of the left peroneal artery and tibial angioplasty was also performed of the left popliteal artery. Angioplasty of the mid and distal superficial femoral arteries was likewise undertaken. Stents were placed in the left above knee popliteal artery following angioplasty and then a second stent placed in the mid- SFA secondary to 50% residual stenosis following the angioplasty. Fortunately patient was much better post procedure as far as stenosis/occlusions were concerned and she did have definitely much better blood flow which was noted to be evident on evaluation today. 01/07/18 on evaluation today patient appears to be doing well on her left lower extremity in regard to the anterior shin ulcer. She has been tolerating the dressing changes without complication. The good news is nothing appears to be doing worse and in general she does seem to have more granulation tissue. There is still bone exposure. Fortunately she's been tolerating the dressing changes. Patient's a the eyes that Dr. Bunnie Domino office on 01/04/18 revealed a  right ABI of 0.69 and a left ABI of 0.81 obviously this is doing very well and significantly improved. Electronic Signature(s) Signed: 01/10/2018 9:06:28 AM By: Worthy Keeler PA-C Entered By: Worthy Keeler on 01/07/2018 14:54:54 Hillis, [name] ([id number]) -------------------------------------------------------------------------------- Physical Exam Details Patient Name: Alexa Lowes P. Date of Service: 01/07/2018 1:30 PM Medical Record Number: [id number] Patient Account Number: [id number] Date of Birth/Sex: 08/28/1926 (84 y.o. Female) Treating RN: Cornell Barman Primary Care Provider: Lucianne Lei Other  Clinician: Referring Provider: Lucianne Lei Treating Provider/Extender: Melburn Hake, HOYT Weeks in Treatment: 41 Constitutional Well-nourished and well-hydrated in no acute distress. Respiratory normal breathing without difficulty. Psychiatric this patient is able to make decisions and demonstrates good insight into disease process. Alert and Oriented x 3. pleasant and cooperative. Notes Patient does not appear to have any evidence of infection which is good news. There does not appear to be any bone necrosis obviously present at this time. Patient is not having any significant discomfort. I did perform sharp debridement today of the granulation tissue that did have slough and biofilm noted on the surface surrounding the bone. No debridement was performed of the bone obviously. Electronic Signature(s) Signed: 01/10/2018 9:06:28 AM By: Worthy Keeler PA-C Entered By: Worthy Keeler on 01/07/2018 14:55:41 [place], [name] ([id number]) -------------------------------------------------------------------------------- Physician Orders Details Patient Name: Alexa Lowes P. Date of Service: 01/07/2018 1:30 PM Medical Record Number: [id number] Patient Account Number: [id number] Date of Birth/Sex: [birth year] (84 y.o. 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 o Cleanse wound with mild soap and water Anesthetic (add to Medication List) Wound #1 Left,Anterior Lower Leg o Topical Lidocaine 4% cream applied to wound bed prior to debridement (In Clinic Only). Primary Wound Dressing Wound #1 Left,Anterior Lower Leg o Mepitel One Contact layer - with Hydrogel just over Bone o Promogran - place on the wound bed except the bone (moisten the Promogran with the Hydrogel/saline as well) Secondary Dressing Wound #1 Left,Anterior Lower Leg o ABD and Kerlix/Conform o Other - tape and stretch net to secure Dressing Change Frequency Wound #1 Left,Anterior Lower Leg o Change Dressing Monday, Wednesday, Friday Follow-up Appointments Wound #1 Left,Anterior Lower Leg o Return Appointment in 1 week. Home Health Wound #1 [place] Visits o Home Health Nurse may visit PRN to address patientos wound care needs. SAMREET, EDENFIELD ([id number]) o FACE TO FACE ENCOUNTER: MEDICARE and MEDICAID PATIENTS: I certify that this patient is under my care and that I had a face-to-face encounter that meets the physician face-to-face encounter requirements with this patient on this date. The encounter with the patient was in whole or in part for the following MEDICAL CONDITION: (primary reason for [place]) MEDICAL NECESSITY: I certify, that based on my findings, NURSING services are a medically necessary home health service. HOME BOUND STATUS: I certify that my clinical findings  support that this patient is homebound (i.e., Due to illness or injury, pt requires aid of supportive devices such as crutches, cane, wheelchairs, walkers, the use of special transportation or the assistance of another person to leave their place of residence. There is a normal inability to leave the home and doing so requires considerable and taxing  effort. Other absences are for medical reasons / religious services and are infrequent or of short duration when for other reasons). o If current dressing causes regression in wound condition, may D/C ordered dressing product/s and apply Normal Saline Moist Dressing daily until next [place] / Other MD appointment. [place] of regression in wound condition at [phone]. o Please direct any NON-WOUND related issues/requests for orders to patient's Primary Care Physician Notes [place] Authorization. Order if insurance will cover at 100%. Electronic Signature(s) Signed: 01/10/2018 9:06:28 AM By: Worthy Keeler PA-C Signed: 01/10/2018 11:05:32 AM By: Gretta Cool, BSN, RN, CWS, Kim RN, BSN Entered By: Gretta Cool, BSN, RN, CWS, Kim on 01/07/2018 14:49:58 Kerri Perches, [name] ([id number]) -------------------------------------------------------------------------------- Problem List Details Patient Name: JAEDIN, TRUMBO P. Date of Service: 01/07/2018 1:30 PM Medical Record Number: [id number] Patient Account Number: [id number] Date of Birth/Sex: 11-Jun-1926 (84 y.o. 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 ([id number]) -------------------------------------------------------------------------------- Progress Note Details Patient Name: Alexa Lowes P. Date of Service: 01/07/2018 1:30 PM Medical Record Number: [id number] Patient Account Number: [id number] Date of Birth/Sex: 1926-10-07 (84 y.o. Female) Treating RN: Cornell Barman Primary Care Provider: Lucianne Lei Other Clinician: Referring Provider: Lucianne Lei Treating Provider/Extender: Melburn Hake, HOYT Weeks in Treatment: 32 Subjective Chief Complaint Information obtained from Patient Patients presents for follow up for left lower leg ulcer History of Present Illness (HPI) 84 year old patient was seen in the ED about 10 days ago for a history of abrasion to the left lower extremity while she was boarding a bus and scraped the anterior part of her left leg. She has been a diabetic for many years and has been taking treatment regularly. Her past medical history is also significant for anemia arthritis constipation and hypertension and is status post abdominal hysterectomy. She has never been a smoker. after her ER visit she was advised to apply Bactroban ointment to the wound twice a day and continue to monitor her blood glucose levels. her last hemoglobin A1c was done 3 years ago and was 6.6% 06/04/17 patient left anterior shin wound appears to be doing well although it is still somewhat dry despite the treatment with Medihoney. We have been using Kerlex following the Medihoney application and I believe this is just not retaining that much moisture. Nonetheless there is no evidence of infection. 06/11/17 on evaluation today patient appears to be doing a little bit worse in regard to  her wound. The entirety of the wound is dry and unfortunately the Medihoney does not seem to be helping this. That is even with the dressing changes that I made last  week to try to retain more moisture. She has not tried [employer] as of yet. She was also noncompressible when testing for ABIs. 07/01/2017 -- she had a arterial studies done and was seen by Dr. Lucky Cowboy. her noninvasive studies showed noncompressible vessels on the right but brisk waveforms and digital pressures of 71 on the right consistent with only mild arterial insufficienc. On the left her ABI was 0.47 and this may be falsely elevated due to calcification. No digital pressures were obtained on the left and this is consistent with severe arterial insufficiency. this critical limb threatening situation on the left, would make wound healing difficult and it represents a serious situation and he has recommended an angiography with possible revascularization. The patient desired to defer the decision to she discuss with the family members. 07/09/17 on evaluation today patient appears to be doing fairly well in regard to her left anterior lower extremity wound. She has been tolerating the dressing changes without complication we have been utilizing Santyl at this time. She tells me that she is having no significant pain compared to what she has had in the past. She has decided after discussing with family that she is going to go forward with the surgery with Dr. dew. This is to restore better blood flow to left lower extremity. With that it does appear that her lower extremity wound is making some progress albeit slow. No fevers, chills, nausea, or vomiting noted at this time. 07/16/17 on evaluation today patient's wound appears to be doing roughly about the same although some of the slough is clearing off. [name] she has her appointment with vascular next week on Thursday the 23rd. With that being said her wound does not appear to be hurting as badly as it has in the past which is good news. No fevers, chills, nausea, or vomiting noted at this time. 07/30/2017 -- she had surgery on 07/22/2017 --indications  being nonhealing ulcer on the left leg with a noninvasive study showing marked reduction in ABI of less than 0.5 and no digital pressure on her left leg. she had a percutaneous transluminal angioplasty of the left common and external iliac arteries. She then had a stent placed to the left external iliac arteries and to the left common iliac artery. 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 The flow distally was poor and there were multiple areas of high-grade stenosis or occlusion of the distal SFA and the popliteal artery with only a diseased peroneal artery as runoff distally. DEEYA, RICHESON ([id number]) She has a postop appointment on September 23 08/20/2017 -- she went back to the vascular office for an appointment on 08/12/2017 and bilateral ABIs were done which were notable for moderate right lower extremity arterial disease and unable to obtain left ankle brachial indices due to absent Doppler velocities in the posterior tibial artery and the anterior tibial artery. Monophasic waveforms to the left femoral and popliteal artery. this was worrisome for a failure of stents placed and lower extremity angiogram was recommended. as was done on 08/16/2017 and the stents placed in the left eye Lex system had some mild narrowing at the iliac bifurcation and the leading edge of the stent proximally was poor wall apposition with stenosis  in the 50-60% range in the right common iliac artery. There was also occlusion of the common femoral artery and the origin of the SFA and profunda femoris artery. Flow distally was poor but they appeared to be multiple areas of high-grade stenosis or occlusion in the distal SFA and popliteal artery with only a diseased peroneal artery. in another stent was placed and deployed. The femoral occlusion would need to be treated surgically. 08/27/17 on evaluation today patient  appears to be doing about the same in regard to her left for extremity wound. Unfortunately secondary to her vascular status this is not a very well healing wound at this time. She has been tolerating the dressing changes she has some discomfort but mainly with cleansing of the wound not at other times. No fevers, chills, nausea, or vomiting noted at this time. 09/17/2017 -- the patient was recently discharged from the hospital where she was admitted between 09/08/2017 and 09/10/2017, when she had a procedure on her left femoral artery with the endarterectomy and a patch. she will be going back for review this coming week. 09/24/2017 -- was seen on 09/18/2017 in the ER, for bleeding from her wound which was debrided earlier during the day. At the time she was seen there was no active bleeding. A thrombin pad was placed over the area and she was asked to change the outside dressing but come back to the wound center as planned. she was also seen at the [facility] office for a review after her surgery and bilateral lower extremity ABI when compared to the previous ones showed minimal improvement of arterial blood flow. She was set up for a CT angiogram of the abdomen and pelvis and lower extremities to see the degree of peripheral arterial disease 10/28/2017 -- had a x-ray of the left tibia and fibula done on 10/07/2017 -- showed no bony acute abnormality she was reviewed by Dr. Corene Cornea dew on 10/26/2017, and he reviewed her CT angiogram and this demonstrated a widely patent left femoral endarterectomy including the origins of the femoral artery. She also had moderate distal disease in the popliteal artery, distal SFA and tibial disease. Her peroneal artery is a dominant runoff distally.he felt she had adequate perfusion for healing the mid calf level and he is putting off all surgical intervention for now and is going to see her back in 2-3 months for noninvasive studies. She  also has a new wound on the plantar aspect of the left big toe where she pulled a piece of skin and has caused a superficial ulceration 11/19/17 on evaluation today patient appears to be doing decently well at this point in time. It does appear that her Wound VAC has been approved as ordered by Dr. Con Memos however she has not received that as of yet. Nonetheless the wound bed in general appears to have minimal slough noted at this time in a fairly good amount of granulation although there is to be a noted in the center of the wound. No fevers, chills, nausea, or vomiting noted at this time. 12/02/17 on evaluation today patient appears to be doing about the same in regard to her left anterior shin wound. Unfortunately her left toe wound appears to still be doing about the same as far as the eschar covering. This has softened up a bit to the point that it is now loose and could have selective debridement which would allow for the Santyl to work better. Fortunately she is having no  pain. 12/10/17 on evaluation today patient appears to be doing fairly well in regard to her left anterior shin wound and that it at least appears to be stable. With that being said she does have really no significant healing at this point this wound seems to be mainly just maintaining. Subsequently patient also has the left toe wound which does not seem to be progressing. There is still nonviable tissue overlying the surface of the wound and this doesn't even seem to be loosening up very well at all. And patient has been using the Santyl. I do think this may need selective debridement today. Fortunately there does not appear to be any evidence of infection although I am concerned about the possibility that patient's blood flow is not likely sufficient to be able to tolerate appropriate wound healing. This was discussed with patient today as well although obviously I would leave the determination up to vascular surgery. She will  be seeing Dr. dew on February 5 for repeat vascular studies and then in office evaluation. ANELY, SPIEWAK ([id number]) 12/17/17 on evaluation today patient appears to be doing about the same in regard to her left shin and left great toe ulcers. She has been performing the dressing changes as previously recommended over the past week. With that being said unfortunately nothing seems to be changing much in regard to the dimensions of the wound and I do feel again that this may be related to vascular flow. Patient's daughter was present during the evaluation today and we did discuss this as well. I will be detailed in greater detail in the plan. 12/23/17 she is here in follow up evaluation of a left great toe and left anterior leg ulcer. There is noted ischemic changes (urple, dry gangrene, shriveling effect laterally) to the left great toe, although not overtly cold. Contact was made to Dr Ozella Almond office regarding possibility of seeing her in the office today or tomorrow as this is an acute change; they recommended she go to the emergency department for evaluation by Dr Ozella Almond partner Dr Curley Spice. The ER was contacted, spoke with Tiffany, regarding this. Patient was accompanied by her son. The left anterior shin ulcer is improved in measurement, overtly exposed bone with area of discoloration/necrosis. will follow up after hospitalization/vascular intervention 12/31/17 on evaluation today patient is status post having gone to the ER last week on 12/23/17. 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 This was performed by Dr. Cleda Mccreedy on 12/23/17. Subsequently Dr. Corene Cornea do with [facility] performed percutaneous evaluation and intervention of patient's left lower extremity on 12/27/17. Angioplasty was performed following angiogram of the left peroneal artery and tibial angioplasty was also performed of the left popliteal artery. Angioplasty of the mid and distal  superficial femoral arteries was likewise undertaken. Stents were placed in the left above knee popliteal artery following angioplasty and then a second stent placed in the mid- SFA secondary to 50% residual stenosis following the angioplasty. Fortunately patient was much better post procedure as far as stenosis/occlusions were concerned and she did have definitely much better blood flow which was noted to be evident on evaluation today. 01/07/18 on evaluation today patient appears to be doing well on her left lower extremity in regard to the anterior shin ulcer. She has been tolerating the dressing changes without complication. The good news is nothing appears to be doing worse and in general she does seem to have more granulation tissue. There is  still bone exposure. Fortunately she's been tolerating the dressing changes. Patient's a the eyes that Dr. Bunnie Domino office on 01/04/18 revealed a right ABI of 0.69 and a left ABI of 0.81 obviously this is doing very well and significantly improved. Patient History Information obtained from Patient. Social History Never smoker, Marital Status - Separated, Alcohol Use - Never, Drug Use - No History, Caffeine Use - Rarely. Review of Systems (ROS) Constitutional Symptoms (General Health) Denies complaints or symptoms of Fever, Chills. Respiratory The patient has no complaints or symptoms. Cardiovascular The patient has no complaints or symptoms. Psychiatric The patient has no complaints or symptoms. Objective Constitutional Well-nourished and well-hydrated in no acute distress. Abeyta, Jream P. ([id number]) Vitals Time Taken: 2:02 PM, Height: 65 in, Weight: 131 lbs, BMI: 21.8, Temperature: 97.7 F, Pulse: 88 bpm, Respiratory Rate: 16 breaths/min, Blood Pressure: 142/68 mmHg. Respiratory normal breathing without difficulty. Psychiatric this patient is able to make decisions and demonstrates good insight into disease process. Alert and Oriented x  3. pleasant and cooperative. General Notes: Patient does not appear to have any evidence of infection which is good news. There does not appear to be any bone necrosis obviously present at this time. Patient is not having any significant discomfort. I did perform sharp debridement today of the granulation tissue that did have slough and biofilm noted on the surface surrounding the bone. No debridement was performed of the bone obviously. Integumentary (Hair, Skin) Wound #1 status is Open. Original cause of wound was Trauma. The wound is located on the Left,Anterior Lower Leg. The wound measures 6cm length x 2.5cm width x 0.2cm depth; 11.781cm^2 area and 2.356cm^3 volume. There is bone, muscle, Fat Layer (Subcutaneous Tissue) Exposed, and fascia exposed. There is no tunneling or undermining noted. There is a large amount of serosanguineous drainage noted. The wound margin is flat and intact. There is medium (34-66%) red granulation within the wound bed. There is a small (1-33%) amount of necrotic tissue within the wound bed including Adherent Slough. The periwound skin appearance exhibited: Scarring, Maceration, Hemosiderin Staining. The periwound skin appearance did not exhibit: Callus, Crepitus, Excoriation, Induration, Rash, Dry/Scaly, Atrophie Blanche, Cyanosis, Ecchymosis, Mottled, Pallor, Rubor, Erythema. Periwound temperature was noted as No Abnormality. The periwound has tenderness on palpation. 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 There was a Skin/Subcutaneous Tissue Debridement ([id number]) debridement with total area of 15 sq cm performed by STONE III, HOYT E., PA-C. with the following instrument(s): Curette to remove Viable and Non-Viable tissue/material including Fibrin/Slough and Subcutaneous after achieving pain control using Other (lidocaine 4%). A time out was conducted at 14:32, prior to the start of the procedure. A Moderate amount of bleeding was controlled with Pressure. The procedure was tolerated well with a pain level of 0 throughout and a pain level of 0 following the procedure. Post Debridement Measurements: 6cm length x 2.5cm width x 0.2cm depth; 2.356cm^3 volume. HALIMAH, BEWICK P. ([id number])  Character of Wound/Ulcer Post Debridement is stable. Severity of Tissue Post Debridement is: Muscle involvement without necrosis. Post procedure Diagnosis Wound #1: Same as Pre-Procedure Plan Wound Cleansing: Wound #1 Left,Anterior Lower Leg: Clean wound with Normal Saline. Cleanse wound with mild soap and water Anesthetic (add to Medication List): Wound #1 Left,Anterior Lower Leg: Topical Lidocaine 4% cream applied to wound bed prior to debridement (In Clinic Only). Primary Wound Dressing: Wound #1 Left,Anterior Lower Leg: Mepitel One Contact layer - with Hydrogel just over Bone Promogran - place on the wound bed except the bone (moisten the Promogran with the Hydrogel/saline as well) Secondary Dressing: Wound #1 Left,Anterior Lower Leg: ABD and Kerlix/Conform Other - tape and stretch net to secure Dressing Change Frequency: Wound #1 Left,Anterior Lower Leg: Change Dressing Monday, Wednesday, Friday Follow-up Appointments: Wound #1 Left,Anterior Lower Leg: Return Appointment in 1 week. Home Health: Wound #1 Left,Anterior Lower  Leg: [facility] Nurse may visit PRN to address patient s wound care needs. FACE TO FACE ENCOUNTER: MEDICARE and MEDICAID PATIENTS: I certify that this patient is under my care and that I had a face-to-face encounter that meets the physician face-to-face encounter requirements with this patient on this date. The encounter with the patient was in whole or in part for the following MEDICAL CONDITION: (primary reason for Albert) MEDICAL NECESSITY: I certify, that based on my findings, NURSING services are a medically necessary home health service. HOME BOUND STATUS: I certify that my clinical findings support that this patient is homebound (i.e., Due to illness or injury, pt requires aid of supportive devices such as crutches, cane, wheelchairs, walkers, the use of special transportation or the assistance of another person to leave their place of residence. There is a normal inability to leave the home and doing so requires considerable and taxing effort. Other absences are for medical reasons / religious services and are infrequent or of short duration when for other reasons). If current dressing causes regression in wound condition, may D/C ordered dressing product/s and apply Normal Saline Moist Dressing daily until next [facility] / Other MD appointment. Craig of regression in wound condition at [phone]. Please direct any NON-WOUND related issues/requests for orders to patient's Primary Care Physician General Notes: Allen. Order if insurance will cover at 100%. I'm going to recommend that we continue with the Current wound care measures although we will make sure that the map itself is just over the bone and nothing of of the granulation section. We will put this in the orders as well. We will see were Suares, Zephaniah P. ([id number]) things stand in one weeks time. We are going to see about [employer]  authorization. Patient is potentially in agreement with this plan. Please see above for specific wound care orders. We will see patient for re-evaluation in 1 week(s) here in the clinic. If anything worsens or changes patient will contact our office for additional recommendations. Electronic Signature(s) Signed: 02/16/2018 3:36:20 AM By: Worthy Keeler PA-C Previous Signature: 01/18/2018 10:42:09 AM Version By: Worthy Keeler PA-C Previous Signature: 01/10/2018 9:06:28 AM Version By: Worthy Keeler PA-C Entered By: Worthy Keeler on 02/16/2018 02:47:19 Mcartor, [name] ([id number]) -------------------------------------------------------------------------------- ROS/PFSH Details Patient Name: Alexa Lowes P. Date of Service: 01/07/2018 1:30 PM Medical Record Number: [id number] Patient Account Number: [id number] Date of Birth/Sex: 04-29-1926 (84 y.o. Female) Treating RN: Cornell Barman Primary Care Provider: Lucianne Lei Other Clinician: Referring Provider: Lucianne Lei Treating Provider/Extender: Joaquim Lai  III, HOYT Weeks in Treatment: 32 Information Obtained From Patient Wound History Do you currently have one or more open woundso Yes How many open wounds do you currently haveo 1 Approximately how long have you had your woundso 3 weeks How have you been treating your wound(s) until nowo open to air Has your wound(s) ever healed and then re-openedo No Have you had any lab work done in the past montho No Have you tested positive for an antibiotic resistant organism (MRSA, VRE)o No Have you tested positive for osteomyelitis (bone infection)o No Have you had any tests for circulation on your legso No Constitutional Symptoms (General Health) Complaints and Symptoms: Negative for: Fever; Chills Hematologic/Lymphatic Medical History: Positive for: Anemia Respiratory Complaints and Symptoms: No Complaints or Symptoms Cardiovascular Complaints and Symptoms: No Complaints or  Symptoms Medical History: Positive for: Hypertension Endocrine Medical History: Positive for: Type II Diabetes Treated with: Oral agents Musculoskeletal Medical History: Positive for: Osteoarthritis Oncologic SAMIKA, VETSCH ([id number]) Medical History: Negative for: Received Chemotherapy; Received Radiation Psychiatric Complaints and Symptoms: No Complaints or Symptoms Immunizations Pneumococcal Vaccine: Received Pneumococcal Vaccination: Yes Immunization Notes: up to date Implantable Devices Family and Social History Never smoker; Marital Status - Separated; Alcohol Use: Never; Drug Use: No History; Caffeine Use: Rarely; Financial Concerns: No; Food, Clothing or Shelter Needs: No; Support System Lacking: No; Transportation Concerns: No; Advanced Directives: No; Patient does not want information on Advanced Directives Physician Affirmation I have reviewed and agree with the above information. Electronic Signature(s) Signed: 01/10/2018 9:06:28 AM By: Worthy Keeler PA-C Signed: 01/10/2018 11:05:32 AM By: Gretta Cool, BSN, RN, CWS, Kim RN, BSN Entered By: Worthy Keeler on 01/07/2018 14:55:18 Beharry, [name] ([id number]) -------------------------------------------------------------------------------- SuperBill Details Patient Name: Alexa Lowes P. Date of Service: 01/07/2018 Medical Record Number: [id number] Patient Account Number: [id number] Date of Birth/Sex: Feb 27, 1926 (84 y.o. 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 L97.824 Non-pressure chronic ulcer of  other part of left lower leg with necrosis of bone I70.245 Atherosclerosis of native arteries of left leg with ulceration of other part of foot Facility Procedures CPT4 Code Description: [id number] 11042 - DEB SUBQ TISSUE 20 SQ CM/< ICD-10 Diagnosis Description L97.824 Non-pressure chronic ulcer of other part of left lower leg with ne Modifier: crosis of bo Quantity: 1 ne Physician Procedures CPT4 Code Description: [id number] 11042 - WC PHYS SUBQ TISS 20 SQ CM ICD-10 Diagnosis Description L97.824 Non-pressure chronic ulcer of other part of left lower leg with ne Modifier: crosis of bon Quantity: 1 e Electronic Signature(s) Signed: 02/16/2018 3:36:20 AM By: Worthy Keeler PA-C Previous Signature: 01/10/2018 9:06:28 AM Version By: Worthy Keeler PA-C Entered By: Worthy Keeler on 02/16/2018 02:41:03

## 2018-01-10 NOTE — Progress Notes (Addendum)
ADRYAN, DRUCKENMILLER ([id number]) Visit Report for 01/07/2018 Arrival Information Details Patient Name: [name], [name] Date of Service: 01/07/2018 1:30 PM Medical Record Number: [id number] Patient Account Number: [id number] Date of Birth/Sex: 12/06/1925 (84 y.o. Female) Treating RN: Cornell Barman Primary Care Sahil Milner: Lucianne Lei Other Clinician: Referring August Gosser: Lucianne Lei Treating Pritika Alvarez/Extender: Melburn Hake, HOYT Weeks in Treatment: 29 Visit Information History Since Last Visit Added or deleted any medications: No Patient Arrived: Walker Any new allergies or adverse reactions: No Arrival Time: 14:03 Had a fall or experienced change in No Accompanied By: granddaughter activities of daily living that may affect Transfer Assistance: None risk of falls: Patient Identification Verified: Yes Signs or symptoms of abuse/neglect since last visito No Secondary Verification Process Yes Hospitalized since last visit: No Completed: Has Dressing in Place as Prescribed: Yes Patient Requires Transmission-Based No Pain Present Now: No Precautions: Patient Has Alerts: Yes Patient Alerts: 01/04/2018 ABI @ [facility] (R) 0.69 (L) 0.81 Electronic Signature(s) Signed: 01/07/2018 2:26:30 PM By: Gretta Cool, BSN, RN, CWS, Kim RN, BSN Entered By: Gretta Cool, BSN, RN, CWS, Kim on 01/07/2018 14:26:30 Freeney, [name] ([id number]) -------------------------------------------------------------------------------- Encounter Discharge Information Details Patient Name: Alexa Lowes P. Date of Service: 01/07/2018 1:30 PM Medical Record Number: [id number] Patient Account Number: [id number] Date of Birth/Sex: 22-Nov-1926 (84 y.o. Female) Treating RN: Cornell Barman Primary Care Emily Massar: Lucianne Lei Other Clinician: Referring Isis Costanza: Lucianne Lei Treating Carolynne Schuchard/Extender: Melburn Hake, HOYT Weeks in Treatment: 45 Encounter Discharge Information Items Discharge Pain Level: 0 Discharge Condition: Stable Ambulatory  Status: Walker Discharge Destination: Home Transportation: Private Auto Accompanied By: granddaughter Schedule Follow-up Appointment: Yes Medication Reconciliation completed and Yes provided to Patient/Care Monte Bronder: Clinical Summary of Care: Provided Form Type Recipient Paper Patient gw Electronic Signature(s) Signed: 01/10/2018 11:05:32 AM By: Gretta Cool, BSN, RN, CWS, Kim RN, BSN Previous Signature: 01/07/2018 2:54:17 PM Version By: Lorine Bears RCP, RRT, CHT Entered By: Gretta Cool, BSN, RN, CWS, Kim on 01/07/2018 14:54:53 [place]ff, [name] ([id number]) -------------------------------------------------------------------------------- Lower Extremity Assessment Details Patient Name: Alexa Lowes P. Date of Service: 01/07/2018 1:30 PM Medical Record Number: [id number] Patient Account Number: [id number] Date of Birth/Sex: [birth year] (84 y.o. Female) Treating RN: Cornell Barman Primary Care Raylen Ken: Lucianne Lei Other Clinician: Referring Estephany Perot: Lucianne Lei Treating Elanah Osmanovic/Extender: Melburn Hake, HOYT Weeks in Treatment: 32 Vascular Assessment Pulses: Posterior Tibial Extremity colors, hair growth, and conditions: Extremity Color: [Left:Normal] Hair Growth on Extremity: [Left:No] Temperature of Extremity: [Left:Warm] Notes unable to access pulse and foot d/t dressing being on there from surgery Electronic Signature(s) Signed: 01/10/2018 11:05:32 AM By: Gretta Cool, BSN, RN, CWS, Kim RN, BSN Entered By: Gretta Cool, BSN, RN, CWS, Kim on 01/07/2018 14:12:57 Brearley, [name] ([id number]) -------------------------------------------------------------------------------- Multi Wound Chart Details Patient Name: Alexa Lowes P. Date of Service: 01/07/2018 1:30 PM Medical Record Number: [id number] Patient Account Number: [id number] Date of Birth/Sex: [birth year] (84 y.o. Female) Treating RN: Cornell Barman Primary Care Lujuana Kapler: Lucianne Lei Other Clinician: Referring Ricardo Kayes: Lucianne Lei Treating Unique Sillas/Extender: Melburn Hake, HOYT Weeks in Treatment: 32 Vital Signs Height(in): 65 Pulse(bpm): 88 Weight(lbs): 131 Blood Pressure(mmHg): 142/68 Body Mass Index(BMI): 22 Temperature(F): 97.7 Respiratory Rate 16 (breaths/min): Photos: [1:No Photos] [N/A:N/A] Wound Location: [1:Left Lower Leg - Anterior] [N/A:N/A] Wounding Event: [1:Trauma] [N/A:N/A] Primary Etiology: [1:Diabetic Wound/Ulcer of the Lower Extremity] [N/A:N/A] Secondary Etiology: [1:Trauma, Other] [N/A:N/A] Comorbid History: [1:Anemia, Hypertension, Type II Diabetes, Osteoarthritis] [N/A:N/A] Date Acquired: [1:05/02/2017] [N/A:N/A] Weeks of Treatment: [1:32] [N/A:N/A] Wound Status: [1:Open] [N/A:N/A] Measurements L x W x D [1:6x2.5x0.2] [N/A:N/A] (cm) Area (cm) : [1:11.781] [N/A:N/A] Volume (cm) : [  1:2.356] [N/A:N/A] % Reduction in Area: [1:-145.10%] [N/A:N/A] % Reduction in Volume: [1:-145.20%] [N/A:N/A] Classification: [1:Grade 2] [N/A:N/A] Exudate Amount: [1:Large] [N/A:N/A] Exudate Type: [1:Serosanguineous] [N/A:N/A] Exudate Color: [1:red, brown] [N/A:N/A] Wound Margin: [1:Flat and Intact] [N/A:N/A] Granulation Amount: [1:Medium (34-66%)] [N/A:N/A] Granulation Quality: [1:Red] [N/A:N/A] Necrotic Amount: [1:Small (1-33%)] [N/A:N/A] Exposed Structures: [1:Fascia: Yes Fat Layer (Subcutaneous Tissue) Exposed: Yes Bone: Yes Tendon: No Muscle: No Joint: No] [N/A:N/A] Epithelialization: [1:Small (1-33%)] [N/A:N/A] Periwound Skin Texture: [1:Scarring: Yes Excoriation: No Induration: No Callus: No] [N/A:N/A] Crepitus: No Rash: No Periwound Skin Moisture: Maceration: Yes N/A N/A Dry/Scaly: No Periwound Skin Color: Hemosiderin Staining: Yes N/A N/A Atrophie Blanche: No Cyanosis: No Ecchymosis: No Erythema: No Mottled: No Pallor: No Rubor: No Temperature: No Abnormality N/A N/A Tenderness on Palpation: Yes N/A N/A Wound Preparation: Ulcer Cleansing: N/A N/A Rinsed/Irrigated with  Saline Topical Anesthetic Applied: Other: lidocaine 4% Treatment Notes Electronic Signature(s) Signed: 01/10/2018 11:05:32 AM By: Gretta Cool, BSN, RN, CWS, Kim RN, BSN Entered By: Gretta Cool, BSN, RN, CWS, Kim on 01/07/2018 14:31:59 [name], [name] ([id number]) -------------------------------------------------------------------------------- Multi-Disciplinary Care Plan Details Patient Name: Alexa Lowes P. Date of Service: 01/07/2018 1:30 PM Medical Record Number: [id number] Patient Account Number: [id number] Date of Birth/Sex: [birth year] (84 y.o. Female) Treating RN: Cornell Barman Primary Care Yaser Harvill: Lucianne Lei Other Clinician: Referring Soffia Doshier: Lucianne Lei Treating Marquist Binstock/Extender: Melburn Hake, HOYT Weeks in Treatment: 32 Active Inactive ` Abuse / Safety / Falls / Self Care Management Nursing Diagnoses: Potential for falls Goals: Patient will remain injury free related to falls Date Initiated: 05/28/2017 Target Resolution Date: 07/23/2017 Goal Status: Active Interventions: Assess fall risk on admission and as needed Notes: ` Orientation to the Wound Care Program Nursing Diagnoses: Knowledge deficit related to the wound healing center program Goals: Patient/caregiver will verbalize understanding of the [facility] Date Initiated: 05/28/2017 Target Resolution Date: 07/23/2017 Goal Status: Active Interventions: Provide education on orientation to the wound center Notes: ` Wound/Skin Impairment Nursing Diagnoses: Impaired tissue integrity Goals: Ulcer/skin breakdown will have a volume reduction of 30% by week 4 Date Initiated: 05/28/2017 Target Resolution Date: 07/23/2017 Goal Status: Active Ulcer/skin breakdown will have a volume reduction of 50% by week 8 Date Initiated: 05/28/2017 Target Resolution Date: 07/23/2017 [name], [name] ([id number]) Goal Status: Active Ulcer/skin breakdown will have a volume reduction of 80% by week 12 Date Initiated:  05/28/2017 Target Resolution Date: 07/23/2017 Goal Status: Active Ulcer/skin breakdown will heal within 14 weeks Date Initiated: 05/28/2017 Target Resolution Date: 07/23/2017 Goal Status: Active Interventions: Assess patient/caregiver ability to obtain necessary supplies Assess patient/caregiver ability to perform ulcer/skin care regimen upon admission and as needed Assess ulceration(s) every visit Notes: Electronic Signature(s) Signed: 01/10/2018 11:05:32 AM By: Gretta Cool, BSN, RN, CWS, Kim RN, BSN Entered By: Gretta Cool, BSN, RN, CWS, Kim on 01/07/2018 14:13:03 [name], [name] ([id number]) -------------------------------------------------------------------------------- Pain Assessment Details Patient Name: Alexa Lowes P. Date of Service: 01/07/2018 1:30 PM Medical Record Number: [id number] Patient Account Number: [id number] Date of Birth/Sex: 09/08/1926 (84 y.o. Female) Treating RN: Cornell Barman Primary Care Sharyah Bostwick: Lucianne Lei Other Clinician: Referring July Nickson: Lucianne Lei Treating Treyshawn Muldrew/Extender: Melburn Hake, HOYT Weeks in Treatment: 32 Active Problems Location of Pain Severity and Description of Pain Patient Has Paino No Site Locations With Dressing Change: No Pain Management and Medication Current Pain Management: Goals for Pain Management Topical or injectable lidocaine is offered to patient for acute pain when surgical debridement is performed. If needed, Patient is instructed to use over the counter pain medication for the following 24-48 hours after debridement.  Wound care MDs do not prescribed pain medications. Patient has chronic pain or uncontrolled pain. Patient has been instructed to make an appointment with their Primary Care Physician for pain management. Electronic Signature(s) Signed: 01/10/2018 11:05:32 AM By: Gretta Cool, BSN, RN, CWS, Kim RN, BSN Entered By: Gretta Cool, BSN, RN, CWS, Kim on 01/07/2018 14:04:44 [name], [name]  ([id number]) -------------------------------------------------------------------------------- Patient/Caregiver Education Details Patient Name: Alexa Lowes P. Date of Service: 01/07/2018 1:30 PM Medical Record Number: [id number] Patient Account Number: [id number] Date of Birth/Gender: 06-10-1926 (84 y.o. Female) Treating RN: Cornell Barman Primary Care Physician: Lucianne Lei Other Clinician: Referring Physician: Lucianne Lei Treating Physician/Extender: Sharalyn Ink in Treatment: 32 Education Assessment Education Provided To: Patient Education Topics Provided Wound/Skin Impairment: Handouts: Caring for Your Ulcer, Other: Orders sent with patient Methods: Demonstration, Explain/Verbal Responses: State content correctly Electronic Signature(s) Signed: 01/10/2018 11:05:32 AM By: Gretta Cool, BSN, RN, CWS, Kim RN, BSN Entered By: Gretta Cool, BSN, RN, CWS, Kim on 01/07/2018 14:55:15 [name], [name] ([id number]) -------------------------------------------------------------------------------- Wound Assessment Details Patient Name: Alexa Lowes P. Date of Service: 01/07/2018 1:30 PM Medical Record Number: [id number] Patient Account Number: [id number] Date of Birth/Sex: 1926-10-22 (84 y.o. Female) Treating RN: Cornell Barman Primary Care Kenny Rea: Lucianne Lei Other Clinician: Referring Ravina Milner: Lucianne Lei Treating Kmari Halter/Extender: Melburn Hake, HOYT Weeks in Treatment: 32 Wound Status Wound Number: 1 Primary Etiology: Diabetic Wound/Ulcer of the Lower Extremity Wound Location: Left Lower Leg - Anterior Secondary Trauma, Other Wounding Event: Trauma Etiology: Date Acquired: 05/02/2017 Wound Status: Open Weeks Of Treatment: 32 Comorbid Anemia, Hypertension, Type II Diabetes, Clustered Wound: No History: Osteoarthritis Photos Wound Measurements Length: (cm) 6 Width: (cm) 2.5 Depth: (cm) 0.2 Area: (cm) 11.781 Volume: (cm) 2.356 % Reduction in Area: -145.1% % Reduction in  Volume: -145.2% Epithelialization: Small (1-33%) Tunneling: No Undermining: No Wound Description Classification: Grade 2 Foul Odor Wound Margin: Flat and Intact Slough/Fi Exudate Amount: Large Exudate Type: Serosanguineous Exudate Color: red, brown After Cleansing: No brino Yes Wound Bed Granulation Amount: Medium (34-66%) Exposed Structure Granulation Quality: Red Fascia Exposed: Yes Necrotic Amount: Small (1-33%) Fat Layer (Subcutaneous Tissue) Exposed: Yes Necrotic Quality: Adherent Slough Tendon Exposed: No Muscle Exposed: Yes Necrosis of Muscle: No Joint Exposed: No Bone Exposed: Yes Periwound Skin Texture Texture Color No Abnormalities Noted: No No Abnormalities Noted: No [name], [name] ([id number]) Callus: No Atrophie Blanche: No Crepitus: No Cyanosis: No Excoriation: No Ecchymosis: No Induration: No Erythema: No Rash: No Hemosiderin Staining: Yes Scarring: Yes Mottled: No Pallor: No Moisture Rubor: No No Abnormalities Noted: No Dry / Scaly: No Temperature / Pain Maceration: Yes Temperature: No Abnormality Tenderness on Palpation: Yes Wound Preparation Ulcer Cleansing: Rinsed/Irrigated with Saline Topical Anesthetic Applied: Other: lidocaine 4%, Treatment Notes Wound #1 (Left, Anterior Lower Leg) 1. Cleansed with: Clean wound with Normal Saline 2. Anesthetic Topical Lidocaine 4% cream to wound bed prior to debridement 4. Dressing Applied: Prisma Ag Notes Mepitel One and hydrogel over bone, Prisma over rest of area, netting Electronic Signature(s) Signed: 01/11/2018 5:08:07 PM By: Gretta Cool, BSN, RN, CWS, Kim RN, BSN Previous Signature: 01/10/2018 11:05:32 AM Version By: Gretta Cool, BSN, RN, CWS, Kim RN, BSN Entered By: Gretta Cool, BSN, RN, CWS, Kim on 01/10/2018 14:29:42 [name], [name] ([id number]) -------------------------------------------------------------------------------- Vitals Details Patient Name: Alexa Lowes P. Date of Service:  01/07/2018 1:30 PM Medical Record Number: [id number] Patient Account Number: [id number] Date of Birth/Sex: 18-Jan-1926 (84 y.o. Female) Treating RN: Cornell Barman Primary Care Ethel Meisenheimer: Lucianne Lei Other Clinician: Referring Carry Weesner: Lucianne Lei Treating Daelin Haste/Extender: Melburn Hake, HOYT Weeks  in Treatment: 32 Vital Signs Time Taken: 14:02 Temperature (F): 97.7 Height (in): 65 Pulse (bpm): 88 Weight (lbs): 131 Respiratory Rate (breaths/min): 16 Body Mass Index (BMI): 21.8 Blood Pressure (mmHg): 142/68 Reference Range: 80 - 120 mg / dl Electronic Signature(s) Signed: 01/10/2018 11:05:32 AM By: Gretta Cool, BSN, RN, CWS, Kim RN, BSN Entered By: Gretta Cool, BSN, RN, CWS, Kim on 01/07/2018 14:05:14

## 2018-01-12 ENCOUNTER — Encounter: Payer: Medicare Other | Admitting: Internal Medicine

## 2018-01-12 DIAGNOSIS — E11622 Type 2 diabetes mellitus with other skin ulcer: Secondary | ICD-10-CM | POA: Diagnosis not present

## 2018-01-13 NOTE — Progress Notes (Signed)
[name] ([id number]) Visit Report for 01/12/2018 HPI Details Patient Name: [name], [name] Date of Service: 01/12/2018 11:00 AM Medical Record Number: [id number] Patient Account Number: [id number] Date of Birth/Sex: [birth year] (84 y.o. Female) Treating RN: Montey Hora Primary Care Provider: Lucianne Lei Other Clinician: Referring Provider: Lucianne Lei Treating Provider/Extender: Tito Dine in Treatment: 102 History of Present Illness HPI Description: 84 year old patient was seen in the ED about 10 days ago for a history of abrasion to the left lower extremity while she was boarding a bus and scraped the anterior part of her left leg. She has been a diabetic for many years and has been taking treatment regularly. Her past medical history is also significant for anemia arthritis constipation and hypertension and is status post abdominal hysterectomy. She has never been a smoker. after her ER visit she was advised to apply Bactroban ointment to the wound twice a day and continue to monitor her blood glucose levels. her last hemoglobin A1c was done 3 years ago and was 6.6% 06/04/17 patient left anterior shin wound appears to be doing well although it is still somewhat dry despite the treatment with Medihoney. We have been using Kerlex following the Medihoney application and I believe this is just not retaining that much moisture. Nonetheless there is no evidence of infection. 06/11/17 on evaluation today patient appears to be doing a little bit worse in regard to her wound. The entirety of the wound is dry and unfortunately the Medihoney does not seem to be helping this. That is even with the dressing changes that I made last week to try to retain more moisture. She has not tried [employer] as of yet. She was also noncompressible when testing for ABIs. 07/01/2017 -- she had a arterial studies done and was seen by Dr. Lucky Cowboy. her noninvasive studies showed  noncompressible vessels on the right but brisk waveforms and digital pressures of 71 on the right consistent with only mild arterial insufficienc. On the left her ABI was 0.47 and this may be falsely elevated due to calcification. No digital pressures were obtained on the left and this is consistent with severe arterial insufficiency. this critical limb threatening situation on the left, would make wound healing difficult and it represents a serious situation and he has recommended an angiography with possible revascularization. The patient desired to defer the decision to she discuss with the family members. 07/09/17 on evaluation today patient appears to be doing fairly well in regard to her left anterior lower extremity wound. She has been tolerating the dressing changes without complication we have been utilizing Santyl at this time. She tells me that she is having no significant pain compared to what she has had in the past. She has decided after discussing with family that she is going to go forward with the surgery with Dr. dew. This is to restore better blood flow to left lower extremity. With that it does appear that her lower extremity wound is making some progress albeit slow. No fevers, chills, nausea, or vomiting noted at this time. 07/16/17 on evaluation today patient's wound appears to be doing roughly about the same although some of the slough is clearing off. [name] she has her appointment with vascular next week on Thursday the 23rd. With that being said her wound does not appear to be hurting as badly as it has in the past which is good news. No fevers, chills, nausea, or vomiting noted at this time. 07/30/2017 -- she had surgery on  07/22/2017 --indications being nonhealing ulcer on the left leg with a noninvasive study showing marked reduction in ABI of less than 0.5 and no digital pressure on her left leg. she had a percutaneous transluminal angioplasty of the left common and  external iliac arteries. She then had a stent placed to the left external iliac arteries and to the left common iliac artery. 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 The flow distally was poor and there were multiple areas of high-grade stenosis or occlusion of the distal SFA and the popliteal artery with only a diseased peroneal artery as runoff distally. She has a postop appointment on September 23 08/20/2017 -- she went back to the vascular office for an appointment on 08/12/2017 and bilateral ABIs were done which [name], [name] ([id number]) were notable for moderate right lower extremity arterial disease and unable to obtain left ankle brachial indices due to absent Doppler velocities in the posterior tibial artery and the anterior tibial artery. Monophasic waveforms to the left femoral and popliteal artery. this was worrisome for a failure of stents placed and lower extremity angiogram was recommended. as was done on 08/16/2017 and the stents placed in the left eye Lex system had some mild narrowing at the iliac bifurcation and the leading edge of the stent proximally was poor wall apposition with stenosis in the 50-60% range in the right common iliac artery. There was also occlusion of the common femoral artery and the origin of the SFA and profunda femoris artery. Flow distally was poor but they appeared to be multiple areas of high-grade stenosis or occlusion in the distal SFA and popliteal artery with only a diseased peroneal artery. in another stent was placed and deployed. The femoral occlusion would need to be treated surgically. 08/27/17 on evaluation today patient appears to be doing about the same in regard to her left for extremity wound. Unfortunately secondary to her vascular status this is not a very well healing wound at this time. She has been tolerating the dressing changes she has  some discomfort but mainly with cleansing of the wound not at other times. No fevers, chills, nausea, or vomiting noted at this time. 09/17/2017 -- the patient was recently discharged from the hospital where she was admitted between 09/08/2017 and 09/10/2017, when she had a procedure on her left femoral artery with the endarterectomy and a patch. she will be going back for review this coming week. 09/24/2017 -- was seen on 09/18/2017 in the ER, for bleeding from her wound which was debrided earlier during the day. At the time she was seen there was no active bleeding. A thrombin pad was placed over the area and she was asked to change the outside dressing but come back to the wound center as planned. she was also seen at the [facility] office for a review after her surgery and bilateral lower extremity ABI when compared to the previous ones showed minimal improvement of arterial blood flow. She was set up for a CT angiogram of the abdomen and pelvis and lower extremities to see the degree of peripheral arterial disease 10/28/2017 -- had a x-ray of the left tibia and fibula done on 10/07/2017 -- showed no bony acute abnormality she was reviewed by Dr. Corene Cornea dew on 10/26/2017, and he reviewed her CT angiogram and this demonstrated a widely patent left femoral endarterectomy including the origins of the femoral artery.  She also had moderate distal disease in the popliteal artery, distal SFA and tibial disease. Her peroneal artery is a dominant runoff distally.he felt she had adequate perfusion for healing the mid calf level and he is putting off all surgical intervention for now and is going to see her back in 2-3 months for noninvasive studies. She also has a new wound on the plantar aspect of the left big toe where she pulled a piece of skin and has caused a superficial ulceration 11/19/17 on evaluation today patient appears to be doing decently well at this point in time.  It does appear that her Wound VAC has been approved as ordered by Dr. Con Memos however she has not received that as of yet. Nonetheless the wound bed in general appears to have minimal slough noted at this time in a fairly good amount of granulation although there is to be a noted in the center of the wound. No fevers, chills, nausea, or vomiting noted at this time. 12/02/17 on evaluation today patient appears to be doing about the same in regard to her left anterior shin wound. Unfortunately her left toe wound appears to still be doing about the same as far as the eschar covering. This has softened up a bit to the point that it is now loose and could have selective debridement which would allow for the Santyl to work better. Fortunately she is having no pain. 12/10/17 on evaluation today patient appears to be doing fairly well in regard to her left anterior shin wound and that it at least appears to be stable. With that being said she does have really no significant healing at this point this wound seems to be mainly just maintaining. Subsequently patient also has the left toe wound which does not seem to be progressing. There is still nonviable tissue overlying the surface of the wound and this doesn't even seem to be loosening up very well at all. And patient has been using the Santyl. I do think this may need selective debridement today. Fortunately there does not appear to be any evidence of infection although I am concerned about the possibility that patient's blood flow is not likely sufficient to be able to tolerate appropriate wound healing. This was discussed with patient today as well although obviously I would leave the determination up to vascular surgery. She will be seeing Dr. dew on February 5 for repeat vascular studies and then in office evaluation. 12/17/17 on evaluation today patient appears to be doing about the same in regard to her left shin and left great toe ulcers. She has been  performing the dressing changes as previously recommended over the past week. With that being said unfortunately nothing seems to be changing much in regard to the dimensions of the wound and I do feel again that this may Yutzy, [name] ([id number]) be related to vascular flow. Patient's daughter was present during the evaluation today and we did discuss this as well. I will be detailed in greater detail in the plan. 12/23/17 she is here in follow up evaluation of a left great toe and left anterior leg ulcer. There is noted ischemic changes (urple, dry gangrene, shriveling effect laterally) to the left great toe, although not overtly cold. Contact was made to Dr Ozella Almond office regarding possibility of seeing her in the office today or tomorrow as this is an acute change; they recommended she go to the emergency department for evaluation by Dr Ozella Almond partner Dr Curley Spice. The ER was  contacted, spoke with Tiffany, regarding this. Patient was accompanied by her son. The left anterior shin ulcer is improved in measurement, overtly exposed bone with area of discoloration/necrosis. will follow up after hospitalization/vascular intervention 12/31/17 on evaluation today patient is status post having gone to the ER last week on 12/23/17. 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 This was performed by Dr. Cleda Mccreedy on 12/23/17. Subsequently Dr. Corene Cornea do with [facility] performed percutaneous evaluation and intervention of patient's left lower extremity on 12/27/17. Angioplasty was performed following angiogram of the left peroneal artery and tibial angioplasty was also performed of the left popliteal artery. Angioplasty of the mid and distal superficial femoral arteries was likewise undertaken. Stents were placed in the left above knee popliteal artery following angioplasty and then a second stent placed in the mid- SFA secondary to 50% residual stenosis following the  angioplasty. Fortunately patient was much better post procedure as far as stenosis/occlusions were concerned and she did have definitely much better blood flow which was noted to be evident on evaluation today. 01/07/18 on evaluation today patient appears to be doing well on her left lower extremity in regard to the anterior shin ulcer. She has been tolerating the dressing changes without complication. The good news is nothing appears to be doing worse and in general she does seem to have more granulation tissue. There is still bone exposure. Fortunately she's been tolerating the dressing changes. Patient's a the eyes that Dr. Bunnie Domino office on 01/04/18 revealed a right ABI of 0.69 and a left ABI of 0.81 obviously this is doing very well and significantly improved. 01/12/18; patient with a traumatic wound on the left anterior tibial area. Fairly substantial wound was still open tibia however the amount of exposed bone is apparently a lot better. We have applied for TheraSkins but have not yet received approval. She has been using silver collagen as mentioned everything appears better. She had recent revascularization by [facility] and had a left ABI of 0.81. 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 This is still wrapped and I did not look at this today, apparently we are not following secondary to this being a recent surgical wound. Electronic Signature(s) Signed: 01/12/2018 4:45:41 PM By: Linton Ham MD Entered By: Linton Ham on 01/12/2018 12:38:31 Bayles, [name] ([id number]) -------------------------------------------------------------------------------- Physical Exam Details Patient Name: Alexa Lowes P. Date of Service: 01/12/2018 11:00 AM Medical Record Number: [id number] Patient Account Number: [id number] Date of Birth/Sex: 1926/11/17 (84 y.o. Female) Treating RN: Montey Hora Primary Care Provider: Lucianne Lei Other Clinician: Referring  Provider: Lucianne Lei Treating Provider/Extender: Tito Dine in Treatment: 32 Constitutional Sitting or standing Blood Pressure is within target range for patient.. Pulse regular and within target range for patient.Marland Kitchen Respirations regular, non-labored and within target range.. Temperature is normal and within the target range for the patient.Marland Kitchen appears in no distress. Eyes Conjunctivae clear. No discharge. Respiratory Respiratory effort is easy and symmetric bilaterally. Rate is normal at rest and on room air.. Cardiovascular I think I can feel her dorsalis pedis pulse through the Ace wrap. Popliteal pulse and femoral pulses palpable.. Lymphatic none palpable in the popliteal area bilaterally. Integumentary (Hair, Skin) no systemic skin issue.Marland Kitchen Psychiatric No evidence of depression, anxiety, or agitation. Calm, cooperative, and communicative. Appropriate interactions and affect.. Notes wound exam; wound is on the anterior left tibial area. Surrounding granulation is healthy. There is some degree of  surface slough although I would leave this for another week. She was apparently just debrided last week. Dimensions are smaller. Linear area of exposed tibia apparently a lot less exposure. There is no evidence of surrounding infection no soft tissue crepitus. Electronic Signature(s) Signed: 01/12/2018 4:45:41 PM By: Linton Ham MD Entered By: Linton Ham on 01/12/2018 12:40:22 [place]den, [name] ([id number]) -------------------------------------------------------------------------------- Physician Orders Details Patient Name: Alexa Lowes P. Date of Service: 01/12/2018 11:00 AM Medical Record Number: [id number] Patient Account Number: [id number] Date of Birth/Sex: 20-Nov-1926 (84 y.o. Female) Treating RN: Montey Hora Primary Care Provider: Lucianne Lei Other Clinician: Referring Provider: Lucianne Lei Treating Provider/Extender: Tito Dine in  Treatment: 22 Verbal / Phone Orders: No Diagnosis Coding Wound Cleansing Wound #1 Left,Anterior Lower Leg o Clean wound with Normal Saline. o Cleanse wound with mild soap and water Anesthetic (add to Medication List) Wound #1 Left,Anterior Lower Leg o Topical Lidocaine 4% cream applied to wound bed prior to debridement (In Clinic Only). Primary Wound Dressing Wound #1 Left,Anterior Lower Leg o Mepitel One Contact layer - with Hydrogel just over Bone o Promogran - place on the wound bed except the bone (moisten the Promogran with the Hydrogel/saline as well) Secondary Dressing Wound #1 Left,Anterior Lower Leg o ABD and Kerlix/Conform o Other - tape and stretch net to secure Dressing Change Frequency Wound #1 Left,Anterior Lower Leg o Change Dressing Monday, Wednesday, Friday Follow-up Appointments Wound #1 Left,Anterior Lower Leg o Return Appointment in 1 week. Home Health Wound #1 [place] Visits - [place] Nurse may visit PRN to address patientos wound care needs. o FACE TO FACE ENCOUNTER: MEDICARE and MEDICAID PATIENTS: I certify that this patient is under my care and that I had a face-to-face encounter that meets the physician face-to-face encounter requirements with this patient on this date. The encounter with the patient was in whole or in part for the following MEDICAL CONDITION: (primary reason for [place]) MEDICAL NECESSITY: I certify, that based on my findings, NURSING services are a medically necessary home health service. HOME BOUND STATUS: I certify that my clinical findings support that this patient is homebound (i.e., Due to illness or injury, pt requires aid of supportive devices such as crutches, cane, wheelchairs, walkers, the use of special transportation or the assistance of another person to leave their place of residence. There is a normal inability to leave the home and doing  so requires considerable and taxing effort. Other absences are for medical reasons / religious services and are infrequent or of short duration when for other reasons). Sudberry, [name] ([id number]) o If current dressing causes regression in wound condition, may D/C ordered dressing product/s and apply Normal Saline Moist Dressing daily until next [place] / Other MD appointment. Butler of regression in wound condition at [phone]. o Please direct any NON-WOUND related issues/requests for orders to patient's Primary Care Physician Electronic Signature(s) Signed: 01/12/2018 4:09:49 PM By: Montey Hora Signed: 01/12/2018 4:45:41 PM By: Linton Ham MD Entered By: Montey Hora on 01/12/2018 11:44:23 [place], [name] ([id number]) -------------------------------------------------------------------------------- Problem List Details Patient Name: Alexa Lowes P. Date of Service: 01/12/2018 11:00 AM Medical Record Number: [id number] Patient Account Number: [id number] Date of Birth/Sex: Mar 24, 1926 (84 y.o. 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 ([id number]) -------------------------------------------------------------------------------- Progress Note Details Patient Name: Alexa Lowes P. Date of Service: 01/12/2018 11:00 AM Medical Record Number: [id number] Patient Account Number: [id number] Date of Birth/Sex: 15-May-1926 (84 y.o. Female) Treating RN: Montey Hora Primary Care Provider: Lucianne Lei Other Clinician: Referring Provider: Lucianne Lei Treating Provider/Extender: Tito Dine in Treatment: 32 Subjective History of Present Illness (HPI) 84 year old patient was seen in the ED about 10 days ago for a history of abrasion to the left lower extremity while she was boarding a bus and scraped the anterior part of her left leg. She has been a diabetic for many years and has been taking treatment regularly. Her past medical history is also significant for anemia arthritis constipation and hypertension and is status post abdominal hysterectomy. She has never been a smoker. after her ER visit she was advised to apply Bactroban ointment to the wound twice a day and continue to monitor her blood glucose levels. her last hemoglobin A1c was done 3 years ago and was 6.6% 06/04/17 patient left anterior shin wound appears to be doing well although it is still somewhat dry despite the treatment with Medihoney. We have been using Kerlex following the Medihoney application and I believe this is just not retaining that much moisture. Nonetheless there is no evidence of infection. 06/11/17 on evaluation today patient appears to be doing a little bit worse in regard to her wound. The entirety of the wound is dry and unfortunately the Medihoney does not seem to be helping this. That is even with the dressing changes that I made last week to try to retain more moisture. She has not tried [employer] as of yet. She was also noncompressible when testing  for ABIs. 07/01/2017 -- she had a arterial studies done and was seen by Dr. Lucky Cowboy. her noninvasive studies showed noncompressible vessels on the right but brisk waveforms and digital pressures of 71 on the right consistent with only mild arterial insufficienc. On the left her ABI was 0.47 and this may be falsely elevated due to calcification. No digital pressures were obtained on the left and this is consistent with severe arterial insufficiency. this critical limb threatening situation on the left, would make wound healing difficult and it represents a serious situation and he has recommended an angiography with possible revascularization. The patient desired to defer the decision to she discuss with the family members. 07/09/17 on evaluation today patient appears to be doing fairly well in regard to her left anterior lower extremity wound. She has been tolerating the dressing changes without complication we have been utilizing Santyl at this time. She tells me that she is having no significant pain compared to what she has had in the past. She has decided after discussing with family that she is going to go forward with the surgery with Dr. dew. This is to restore better blood flow to left lower extremity. With that it does appear that her lower extremity  wound is making some progress albeit slow. No fevers, chills, nausea, or vomiting noted at this time. 07/16/17 on evaluation today patient's wound appears to be doing roughly about the same although some of the slough is clearing off. [name] she has her appointment with vascular next week on Thursday the 23rd. With that being said her wound does not appear to be hurting as badly as it has in the past which is good news. No fevers, chills, nausea, or vomiting noted at this time. 07/30/2017 -- she had surgery on 07/22/2017 --indications being nonhealing ulcer on the left leg with a noninvasive study showing marked reduction in ABI of less than 0.5 and  no digital pressure on her left leg. she had a percutaneous transluminal angioplasty of the left common and external iliac arteries. She then had a stent placed to the left external iliac arteries and to the left common iliac artery. 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 The flow distally was poor and there were multiple areas of high-grade stenosis or occlusion of the distal SFA and the popliteal artery with only a diseased peroneal artery as runoff distally. She has a postop appointment on September 23 08/20/2017 -- she went back to the vascular office for an appointment on 08/12/2017 and bilateral ABIs were done which were notable for moderate right lower extremity arterial disease and unable to obtain left ankle brachial indices due to absent Doppler velocities in the posterior tibial artery and the anterior tibial artery. Monophasic waveforms to the left femoral and Bogan, [name] ([id number]) popliteal artery. this was worrisome for a failure of stents placed and lower extremity angiogram was recommended. as was done on 08/16/2017 and the stents placed in the left eye Lex system had some mild narrowing at the iliac bifurcation and the leading edge of the stent proximally was poor wall apposition with stenosis in the 50-60% range in the right common iliac artery. There was also occlusion of the common femoral artery and the origin of the SFA and profunda femoris artery. Flow distally was poor but they appeared to be multiple areas of high-grade stenosis or occlusion in the distal SFA and popliteal artery with only a diseased peroneal artery. in another stent was placed and deployed. The femoral occlusion would need to be treated surgically. 08/27/17 on evaluation today patient appears to be doing about the same in regard to her left for extremity wound. Unfortunately secondary to her vascular  status this is not a very well healing wound at this time. She has been tolerating the dressing changes she has some discomfort but mainly with cleansing of the wound not at other times. No fevers, chills, nausea, or vomiting noted at this time. 09/17/2017 -- the patient was recently discharged from the hospital where she was admitted between 09/08/2017 and 09/10/2017, when she had a procedure on her left femoral artery with the endarterectomy and a patch. she will be going back for review this coming week. 09/24/2017 -- was seen on 09/18/2017 in the ER, for bleeding from her wound which was debrided earlier during the day. At the time she was seen there was no active bleeding. A thrombin pad was placed over the area and she was asked to change the outside dressing but come back to the wound center as planned. she was also seen at the [facility] office for a review after her surgery and  bilateral lower extremity ABI when compared to the previous ones showed minimal improvement of arterial blood flow. She was set up for a CT angiogram of the abdomen and pelvis and lower extremities to see the degree of peripheral arterial disease 10/28/2017 -- had a x-ray of the left tibia and fibula done on 10/07/2017 -- showed no bony acute abnormality she was reviewed by Dr. Corene Cornea dew on 10/26/2017, and he reviewed her CT angiogram and this demonstrated a widely patent left femoral endarterectomy including the origins of the femoral artery. She also had moderate distal disease in the popliteal artery, distal SFA and tibial disease. Her peroneal artery is a dominant runoff distally.he felt she had adequate perfusion for healing the mid calf level and he is putting off all surgical intervention for now and is going to see her back in 2-3 months for noninvasive studies. She also has a new wound on the plantar aspect of the left big toe where she pulled a piece of skin and has caused  a superficial ulceration 11/19/17 on evaluation today patient appears to be doing decently well at this point in time. It does appear that her Wound VAC has been approved as ordered by Dr. Con Memos however she has not received that as of yet. Nonetheless the wound bed in general appears to have minimal slough noted at this time in a fairly good amount of granulation although there is to be a noted in the center of the wound. No fevers, chills, nausea, or vomiting noted at this time. 12/02/17 on evaluation today patient appears to be doing about the same in regard to her left anterior shin wound. Unfortunately her left toe wound appears to still be doing about the same as far as the eschar covering. This has softened up a bit to the point that it is now loose and could have selective debridement which would allow for the Santyl to work better. Fortunately she is having no pain. 12/10/17 on evaluation today patient appears to be doing fairly well in regard to her left anterior shin wound and that it at least appears to be stable. With that being said she does have really no significant healing at this point this wound seems to be mainly just maintaining. Subsequently patient also has the left toe wound which does not seem to be progressing. There is still nonviable tissue overlying the surface of the wound and this doesn't even seem to be loosening up very well at all. And patient has been using the Santyl. I do think this may need selective debridement today. Fortunately there does not appear to be any evidence of infection although I am concerned about the possibility that patient's blood flow is not likely sufficient to be able to tolerate appropriate wound healing. This was discussed with patient today as well although obviously I would leave the determination up to vascular surgery. She will be seeing Dr. dew on February 5 for repeat vascular studies and then in office evaluation. 12/17/17 on  evaluation today patient appears to be doing about the same in regard to her left shin and left great toe ulcers. She has been performing the dressing changes as previously recommended over the past week. With that being said unfortunately nothing seems to be changing much in regard to the dimensions of the wound and I do feel again that this may be related to vascular flow. Patient's daughter was present during the evaluation today and we did discuss this as well. I will be  detailed in greater detail in the plan. RECIE, CIRRINCIONE ([id number]) 12/23/17 she is here in follow up evaluation of a left great toe and left anterior leg ulcer. There is noted ischemic changes (urple, dry gangrene, shriveling effect laterally) to the left great toe, although not overtly cold. Contact was made to Dr Ozella Almond office regarding possibility of seeing her in the office today or tomorrow as this is an acute change; they recommended she go to the emergency department for evaluation by Dr Ozella Almond partner Dr Curley Spice. The ER was contacted, spoke with Tiffany, regarding this. Patient was accompanied by her son. The left anterior shin ulcer is improved in measurement, overtly exposed bone with area of discoloration/necrosis. will follow up after hospitalization/vascular intervention 12/31/17 on evaluation today patient is status post having gone to the ER last week on 12/23/17. 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 This was performed by Dr. Cleda Mccreedy on 12/23/17. Subsequently Dr. Corene Cornea do with [facility] performed percutaneous evaluation and intervention of patient's left lower extremity on 12/27/17. Angioplasty was performed following angiogram of the left peroneal artery and tibial angioplasty was also performed of the left popliteal artery. Angioplasty of the mid and distal superficial femoral arteries was likewise undertaken. Stents were placed in the left above knee popliteal  artery following angioplasty and then a second stent placed in the mid- SFA secondary to 50% residual stenosis following the angioplasty. Fortunately patient was much better post procedure as far as stenosis/occlusions were concerned and she did have definitely much better blood flow which was noted to be evident on evaluation today. 01/07/18 on evaluation today patient appears to be doing well on her left lower extremity in regard to the anterior shin ulcer. She has been tolerating the dressing changes without complication. The good news is nothing appears to be doing worse and in general she does seem to have more granulation tissue. There is still bone exposure. Fortunately she's been tolerating the dressing changes. Patient's a the eyes that Dr. Bunnie Domino office on 01/04/18 revealed a right ABI of 0.69 and a left ABI of 0.81 obviously this is doing very well and significantly improved. 01/12/18; patient with a traumatic wound on the left anterior tibial area. Fairly substantial wound was still open tibia however the amount of exposed bone is apparently a lot better. We have applied for TheraSkins but have not yet received approval. She has been using silver collagen as mentioned everything appears better. She had recent revascularization by [facility] and had a left ABI of 0.81. 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 This is still wrapped and I did not look at this today, apparently we are not following secondary to this being a recent surgical wound. Objective Constitutional Sitting or standing Blood Pressure is within target range for patient.. Pulse regular and within target range for patient.Marland Kitchen Respirations regular, non-labored and within target range.. Temperature is normal and within the target range for the patient.Marland Kitchen appears in no distress. Vitals Time Taken: 11:26 AM, Height: 65 in, Weight: 131 lbs, BMI: 21.8, Temperature: 97.8 F, Pulse: 95 bpm,  Respiratory Rate: 16 breaths/min, Blood Pressure: 133/80 mmHg. Eyes Conjunctivae clear. No discharge. Respiratory Respiratory effort is easy and symmetric bilaterally. Rate is normal at rest and on room air.. Cardiovascular I think I can feel her dorsalis pedis pulse through the Ace wrap. Popliteal pulse and femoral pulses palpable.Kerri Perches, [name] ([id number]) Lymphatic none  palpable in the popliteal area bilaterally. Psychiatric No evidence of depression, anxiety, or agitation. Calm, cooperative, and communicative. Appropriate interactions and affect.. General Notes: wound exam; wound is on the anterior left tibial area. Surrounding granulation is healthy. There is some degree of surface slough although I would leave this for another week. She was apparently just debrided last week. Dimensions are smaller. Linear area of exposed tibia apparently a lot less exposure. There is no evidence of surrounding infection no soft tissue crepitus. Integumentary (Hair, Skin) no systemic skin issue.. Wound #1 status is Open. Original cause of wound was Trauma. The wound is located on the Left,Anterior Lower Leg. The wound measures 6.6cm length x 2.5cm width x 0.2cm depth; 12.959cm^2 area and 2.592cm^3 volume. There is bone, muscle, Fat Layer (Subcutaneous Tissue) Exposed, and fascia exposed. There is no tunneling or undermining noted. There is a large amount of serosanguineous drainage noted. The wound margin is flat and intact. There is medium (34-66%) red granulation within the wound bed. There is a medium (34-66%) amount of necrotic tissue within the wound bed including Adherent Slough. The periwound skin appearance exhibited: Scarring, Maceration, Hemosiderin Staining. The periwound skin appearance did not exhibit: Callus, Crepitus, Excoriation, Induration, Rash, Dry/Scaly, Atrophie Blanche, Cyanosis, Ecchymosis, Mottled, Pallor, Rubor, Erythema. Periwound temperature was noted as No  Abnormality. The periwound has tenderness on palpation. 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 Cleanse wound with mild soap and water Anesthetic (add to Medication List): Wound #1 Left,Anterior Lower Leg: Topical Lidocaine 4% cream applied to wound bed prior to debridement (In Clinic Only). Primary Wound Dressing: Wound #1 Left,Anterior Lower Leg: Mepitel One Contact layer - with Hydrogel just over Bone Promogran - place on the wound bed except the bone (moisten the Promogran with the Hydrogel/saline as well) Secondary Dressing: [name], [name]. ([id number]) Wound #1 Left,Anterior Lower Leg: ABD and Kerlix/Conform Other - tape and stretch net to secure Dressing Change Frequency: Wound #1 Left,Anterior Lower Leg: Change Dressing Monday, Wednesday, Friday Follow-up Appointments: Wound #1 Left,Anterior Lower Leg: Return Appointment in 1 week. Home Health: Wound #1 Left,Anterior Lower Leg: [facility] [facility] - [place] Nurse may visit PRN to address patient s wound care needs. FACE TO FACE ENCOUNTER: MEDICARE and MEDICAID PATIENTS: I certify that this patient is under my care and that I had a face-to-face encounter that meets the physician face-to-face encounter requirements with this patient on this date. The encounter with the patient was in whole or in part for the following MEDICAL CONDITION: (primary reason for [place]) MEDICAL NECESSITY: I certify, that based  on my findings, NURSING services are a medically necessary home health service. HOME BOUND STATUS: I certify that my clinical findings support that this patient is homebound (i.e., Due to illness or injury, pt requires aid of supportive devices such as crutches, cane, wheelchairs, walkers, the use of special transportation or the assistance of another person to leave their place of residence. There is a normal inability to leave the home and doing so requires considerable  and taxing effort. Other absences are for medical reasons / religious services and are infrequent or of short duration when for other reasons). If current dressing causes regression in wound condition, may D/C ordered dressing product/s and apply Normal Saline Moist Dressing daily until next [place] / Other MD appointment. Frost of regression in wound condition at [phone]. Please direct any NON-WOUND related issues/requests for orders to patient's Primary Care Physician #1 the patient appears to be making goodgood improvement with less exposed bone #2 Promogran/hydrogel/Mepitel #3 TheraSkin would be a good option here. Electronic Signature(s) Signed: 01/12/2018 4:45:41 PM By: Linton Ham MD Entered By: Linton Ham on 01/12/2018 12:46:02 Miceli, [name] ([id number]) -------------------------------------------------------------------------------- SuperBill Details Patient Name: Alexa Lowes P. Date of Service: 01/12/2018 Medical Record Number: [id number] Patient Account Number: [id number] Date of Birth/Sex: 03-May-1926 (84 y.o. 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 I70.245 Atherosclerosis of native arteries of left leg with ulceration of other part of foot Facility Procedures CPT4 Code: [id number] Description: 99213 - WOUND CARE VISIT-LEV 3 EST PT Modifier: Quantity: 1 Physician Procedures CPT4 Code Description: [id number] [place] - WC PHYS LEVEL 3 - EST PT ICD-10 Diagnosis Description I70.242 Atherosclerosis of native arteries of left leg with ulceration of L97.824 Non-pressure chronic ulcer of other part of left lower leg with n Modifier: calf ecrosis of bone Quantity: 1 Electronic Signature(s) Signed: 01/13/2018 11:55:37 AM By: Montey Hora Previous Signature: 01/12/2018 4:45:41 PM Version By: Linton Ham MD Entered By: Montey Hora on 01/13/2018 11:55:36

## 2018-01-13 NOTE — Progress Notes (Addendum)
[name] ([id number]) Visit Report for 01/12/2018 Arrival Information Details Patient Name: [name], [name]. Date of Service: 01/12/2018 11:00 AM Medical Record Number: [id number] Patient Account Number: [id number] Date of Birth/Sex: [birth year] (84 y.o. Female) Treating RN: Montey Hora Primary Care Almedia Cordell: Lucianne Lei Other Clinician: Referring Akaysha Cobern: Lucianne Lei Treating Rejeana Fadness/Extender: Tito Dine in Treatment: 23 Visit Information History Since Last Visit Added or deleted any medications: No Patient Arrived: Wheel Chair Any new allergies or adverse reactions: No Arrival Time: 11:22 Had a fall or experienced change in No Accompanied By: self activities of daily living that may affect Transfer Assistance: None risk of falls: Patient Identification Verified: Yes Signs or symptoms of abuse/neglect since last visito No Secondary Verification Process Yes Hospitalized since last visit: No Completed: Has Dressing in Place as Prescribed: Yes Patient Requires Transmission-Based No Pain Present Now: No Precautions: Patient Has Alerts: Yes Patient Alerts: 01/04/2018 ABI @ [facility] (R) 0.69 (L) 0.81 Electronic Signature(s) Signed: 01/12/2018 4:09:49 PM By: Montey Hora Entered By: Montey Hora on 01/12/2018 11:25:28 [name], [name]. ([id number]) -------------------------------------------------------------------------------- Clinic Level of Care Assessment Details Patient Name: [name]. Date of Service: 01/12/2018 11:00 AM Medical Record Number: [id number] Patient Account Number: [id number] Date of Birth/Sex: Jun 09, 1926 (84 y.o. Female) Treating RN: Montey Hora Primary Care Calbert Hulsebus: Lucianne Lei Other Clinician: Referring Rozlynn Lippold: Lucianne Lei Treating Nasirah Sachs/Extender: Tito Dine in Treatment: 32 Clinic Level of Care Assessment Items TOOL 4 Quantity Score []  - Use when only an EandM is performed on FOLLOW-UP  visit 0 ASSESSMENTS - Nursing Assessment / Reassessment X - Reassessment of Co-morbidities (includes updates in patient status) 1 10 X- 1 5 Reassessment of Adherence to Treatment Plan ASSESSMENTS - Wound and Skin Assessment / Reassessment X - Simple Wound Assessment / Reassessment - one wound 1 5 []  - 0 Complex Wound Assessment / Reassessment - multiple wounds []  - 0 Dermatologic / Skin Assessment (not related to wound area) ASSESSMENTS - Focused Assessment []  - Circumferential Edema Measurements - multi extremities 0 []  - 0 Nutritional Assessment / Counseling / Intervention X- 1 5 Lower Extremity Assessment (monofilament, tuning fork, pulses) []  - 0 Peripheral Arterial Disease Assessment (using hand held doppler) ASSESSMENTS - Ostomy and/or Continence Assessment and Care []  - Incontinence Assessment and Management 0 []  - 0 Ostomy Care Assessment and Management (repouching, etc.) PROCESS - Coordination of Care X - Simple Patient / Family Education for ongoing care 1 15 []  - 0 Complex (extensive) Patient / Family Education for ongoing care []  - 0 Staff obtains Programmer, systems, Records, Test Results / Process Orders []  - 0 Staff telephones HHA, Nursing Homes / Clarify orders / etc []  - 0 Routine Transfer to another Facility (non-emergent condition) []  - 0 Routine Hospital Admission (non-emergent condition) []  - 0 New Admissions / Biomedical engineer / Ordering NPWT, Apligraf, etc. []  - 0 Emergency Hospital Admission (emergent condition) X- 1 10 Simple Discharge Coordination [name]. ([id number]) []  - 0 Complex (extensive) Discharge Coordination PROCESS - Special Needs []  - Pediatric / Minor Patient Management 0 []  - 0 Isolation Patient Management []  - 0 Hearing / Language / Visual special needs []  - 0 Assessment of Community assistance (transportation, D/C planning, etc.) []  - 0 Additional assistance / Altered mentation []  - 0 Support Surface(s)  Assessment (bed, cushion, seat, etc.) INTERVENTIONS - Wound Cleansing / Measurement X - Simple Wound Cleansing - one wound 1 5 []  - 0 Complex Wound Cleansing - multiple wounds X- 1 5 Wound  Imaging (photographs - any number of wounds) []  - 0 Wound Tracing (instead of photographs) X- 1 5 Simple Wound Measurement - one wound []  - 0 Complex Wound Measurement - multiple wounds INTERVENTIONS - Wound Dressings []  - Small Wound Dressing one or multiple wounds 0 X- 1 15 Medium Wound Dressing one or multiple wounds []  - 0 Large Wound Dressing one or multiple wounds []  - 0 Application of Medications - topical []  - 0 Application of Medications - injection INTERVENTIONS - Miscellaneous []  - External ear exam 0 []  - 0 Specimen Collection (cultures, biopsies, blood, body fluids, etc.) []  - 0 Specimen(s) / Culture(s) sent or taken to Lab for analysis []  - 0 Patient Transfer (multiple staff / Civil Service fast streamer / Similar devices) []  - 0 Simple Staple / Suture removal (25 or less) []  - 0 Complex Staple / Suture removal (26 or more) []  - 0 Hypo / Hyperglycemic Management (close monitor of Blood Glucose) []  - 0 Ankle / Brachial Index (ABI) - do not check if billed separately X- 1 5 Vital Signs [name], [name]. ([id number]) Has the patient been seen at the hospital within the last three years: Yes Total Score: 85 Level Of Care: New/Established - Level 3 Electronic Signature(s) Signed: 01/13/2018 4:22:52 PM By: Montey Hora Entered By: Montey Hora on 01/13/2018 11:55:25 [name], [name] ([id number]) -------------------------------------------------------------------------------- Encounter Discharge Information Details Patient Name: [name]. Date of Service: 01/12/2018 11:00 AM Medical Record Number: [id number] Patient Account Number: [id number] Date of Birth/Sex: Apr 11, 1926 (84 y.o. Female) Treating RN: Montey Hora Primary Care Kelsea Mousel: Lucianne Lei Other  Clinician: Referring Zipporah Finamore: Lucianne Lei Treating Onelia Cadmus/Extender: Tito Dine in Treatment: 45 Encounter Discharge Information Items Discharge Pain Level: 0 Discharge Condition: Stable Ambulatory Status: Wheelchair Discharge Destination: Home Transportation: Private Auto Accompanied By: self Schedule Follow-up Appointment: Yes Medication Reconciliation completed and No provided to Patient/Care Cara Aguino: Provided on Clinical Summary of Care: 01/12/2018 Form Type Recipient Paper Patient GW Electronic Signature(s) Signed: 01/13/2018 11:57:16 AM By: Montey Hora Previous Signature: 01/12/2018 4:05:31 PM Version By: Ruthine Dose Entered By: Montey Hora on 01/13/2018 11:57:16 [place], QQVZDGLO [name]. ([id number]) -------------------------------------------------------------------------------- Lower Extremity Assessment Details Patient Name: [name]. Date of Service: 01/12/2018 11:00 AM Medical Record Number: [id number] Patient Account Number: [id number] Date of Birth/Sex: 1926/06/26 (84 y.o. Female) Treating RN: Montey Hora Primary Care Sarahmarie Leavey: Lucianne Lei Other Clinician: Referring Jerimie Mancuso: Lucianne Lei Treating Sanjit Mcmichael/Extender: Tito Dine in Treatment: 32 Vascular Assessment Pulses: Posterior Tibial Extremity colors, hair growth, and conditions: Extremity Color: [Left:Hyperpigmented] Hair Growth on Extremity: [Left:No] Temperature of Extremity: [Left:Warm] Electronic Signature(s) Signed: 01/12/2018 4:09:49 PM By: Montey Hora Entered By: Montey Hora on 01/12/2018 11:31:52 [name], [name]. ([id number]) -------------------------------------------------------------------------------- Multi Wound Chart Details Patient Name: [name]. Date of Service: 01/12/2018 11:00 AM Medical Record Number: [id number] Patient Account Number: [id number] Date of Birth/Sex: May 21, 1926 (84 y.o. Female) Treating RN: Montey Hora Primary Care Radwan Cowley: Lucianne Lei Other Clinician: Referring Cherry Turlington: Lucianne Lei Treating Lora Chavers/Extender: Tito Dine in Treatment: 32 Vital Signs Height(in): 65 Pulse(bpm): 95 Weight(lbs): 131 Blood Pressure(mmHg): 133/80 Body Mass Index(BMI): 22 Temperature(F): 97.8 Respiratory Rate 16 (breaths/min): Photos: [1:No Photos] [N/A:N/A] Wound Location: [1:Left Lower Leg - Anterior] [N/A:N/A] Wounding Event: [1:Trauma] [N/A:N/A] Primary Etiology: [1:Diabetic Wound/Ulcer of the Lower Extremity] [N/A:N/A] Secondary Etiology: [1:Trauma, Other] [N/A:N/A] Comorbid History: [1:Anemia, Hypertension, Type II Diabetes, Osteoarthritis] [N/A:N/A] Date Acquired: [1:05/02/2017] [N/A:N/A] Weeks of Treatment: [1:32] [N/A:N/A] Wound Status: [1:Open] [N/A:N/A] Measurements L x W x D [1:6.6x2.5x0.2] [N/A:N/A] (  cm) Area (cm) : [1:12.959] [N/A:N/A] Volume (cm) : [1:2.592] [N/A:N/A] % Reduction in Area: [1:-169.60%] [N/A:N/A] % Reduction in Volume: [1:-169.70%] [N/A:N/A] Classification: [1:Grade 2] [N/A:N/A] Exudate Amount: [1:Large] [N/A:N/A] Exudate Type: [1:Serosanguineous] [N/A:N/A] Exudate Color: [1:red, brown] [N/A:N/A] Wound Margin: [1:Flat and Intact] [N/A:N/A] Granulation Amount: [1:Medium (34-66%)] [N/A:N/A] Granulation Quality: [1:Red] [N/A:N/A] Necrotic Amount: [1:Medium (34-66%)] [N/A:N/A] Exposed Structures: [1:Fascia: Yes Fat Layer (Subcutaneous Tissue) Exposed: Yes Muscle: Yes Bone: Yes Tendon: No Joint: No] [N/A:N/A] Epithelialization: [1:Small (1-33%)] [N/A:N/A] Periwound Skin Texture: [1:Scarring: Yes Excoriation: No Induration: No Callus: No] [N/A:N/A] Crepitus: No Rash: No Periwound Skin Moisture: Maceration: Yes N/A N/A Dry/Scaly: No Periwound Skin Color: Hemosiderin Staining: Yes N/A N/A Atrophie Blanche: No Cyanosis: No Ecchymosis: No Erythema: No Mottled: No Pallor: No Rubor: No Temperature: No Abnormality N/A N/A Tenderness on  Palpation: Yes N/A N/A Wound Preparation: Ulcer Cleansing: N/A N/A Rinsed/Irrigated with Saline Topical Anesthetic Applied: Other: lidocaine 4% Treatment Notes Electronic Signature(s) Signed: 01/12/2018 4:45:41 PM By: Linton Ham MD Entered By: Linton Ham on 01/12/2018 12:36:42 [name], [name] ([id number]) -------------------------------------------------------------------------------- Multi-Disciplinary Care Plan Details Patient Name: [name]. Date of Service: 01/12/2018 11:00 AM Medical Record Number: [id number] Patient Account Number: [id number] Date of Birth/Sex: 30-Aug-1926 (84 y.o. Female) Treating RN: Montey Hora Primary Care Bentlee Benningfield: Lucianne Lei Other Clinician: Referring Tymier Lindholm: Lucianne Lei Treating Jadarian Mckay/Extender: Tito Dine in Treatment: 32 Active Inactive ` Abuse / Safety / Falls / Self Care Management Nursing Diagnoses: Potential for falls Goals: Patient will remain injury free related to falls Date Initiated: 05/28/2017 Target Resolution Date: 07/23/2017 Goal Status: Active Interventions: Assess fall risk on admission and as needed Notes: ` Orientation to the Wound Care Program Nursing Diagnoses: Knowledge deficit related to the wound healing center program Goals: Patient/caregiver will verbalize understanding of the [facility] Program Date Initiated: 05/28/2017 Target Resolution Date: 07/23/2017 Goal Status: Active Interventions: Provide education on orientation to the wound center Notes: ` Wound/Skin Impairment Nursing Diagnoses: Impaired tissue integrity Goals: Ulcer/skin breakdown will have a volume reduction of 30% by week 4 Date Initiated: 05/28/2017 Target Resolution Date: 07/23/2017 Goal Status: Active Ulcer/skin breakdown will have a volume reduction of 50% by week 8 Date Initiated: 05/28/2017 Target Resolution Date: 07/23/2017 GAVRIELLE, STRECK ([id number]) Goal Status:  Active Ulcer/skin breakdown will have a volume reduction of 80% by week 12 Date Initiated: 05/28/2017 Target Resolution Date: 07/23/2017 Goal Status: Active Ulcer/skin breakdown will heal within 14 weeks Date Initiated: 05/28/2017 Target Resolution Date: 07/23/2017 Goal Status: Active Interventions: Assess patient/caregiver ability to obtain necessary supplies Assess patient/caregiver ability to perform ulcer/skin care regimen upon admission and as needed Assess ulceration(s) every visit Notes: Electronic Signature(s) Signed: 01/12/2018 4:09:49 PM By: Montey Hora Entered By: Montey Hora on 01/12/2018 11:43:46 [name], [name]. ([id number]) -------------------------------------------------------------------------------- Pain Assessment Details Patient Name: [name]. Date of Service: 01/12/2018 11:00 AM Medical Record Number: [id number] Patient Account Number: [id number] Date of Birth/Sex: May 21, 1926 (84 y.o. Female) Treating RN: Montey Hora Primary Care Oliviana Mcgahee: Lucianne Lei Other Clinician: Referring Andrienne Havener: Lucianne Lei Treating Rio Kidane/Extender: Tito Dine in Treatment: 32 Active Problems Location of Pain Severity and Description of Pain Patient Has Paino Yes Site Locations Pain Location: Pain in Ulcers With Dressing Change: Yes Duration of the Pain. Constant / Intermittento Intermittent Pain Management and Medication Current Pain Management: Goals for Pain Management "hurts a little bit sometimes" Notes Topical or injectable lidocaine is offered to patient for acute pain when surgical debridement is performed. If needed, Patient is instructed to  use over the counter pain medication for the following 24-48 hours after debridement. Wound care MDs do not prescribed pain medications. Patient has chronic pain or uncontrolled pain. Patient has been instructed to make an appointment with their Primary Care Physician for pain  management. Electronic Signature(s) Signed: 01/12/2018 4:09:49 PM By: Montey Hora Entered By: Montey Hora on 01/12/2018 11:25:50 [name], [name] ([id number]) -------------------------------------------------------------------------------- Patient/Caregiver Education Details Patient Name: [name]. Date of Service: 01/12/2018 11:00 AM Medical Record Number: [id number] Patient Account Number: [id number] Date of Birth/Gender: 1926-11-03 (84 y.o. Female) Treating RN: Montey Hora Primary Care Physician: Lucianne Lei Other Clinician: Referring Physician: Lucianne Lei Treating Physician/Extender: Tito Dine in Treatment: 24 Education Assessment Education Provided To: Patient Education Topics Provided Wound/Skin Impairment: Handouts: Other: wound care as ordered Methods: Demonstration, Explain/Verbal Responses: State content correctly Electronic Signature(s) Signed: 01/13/2018 4:22:52 PM By: Montey Hora Entered By: Montey Hora on 01/13/2018 11:57:36 [name], [name]. ([id number]) -------------------------------------------------------------------------------- Wound Assessment Details Patient Name: [name]. Date of Service: 01/12/2018 11:00 AM Medical Record Number: [id number] Patient Account Number: [id number] Date of Birth/Sex: 1926-01-23 (84 y.o. Female) Treating RN: Montey Hora Primary Care Ferrell Claiborne: Lucianne Lei Other Clinician: Referring Ronin Crager: Lucianne Lei Treating Norrine Ballester/Extender: Tito Dine in Treatment: 32 Wound Status Wound Number: 1 Primary Etiology: Diabetic Wound/Ulcer of the Lower Extremity Wound Location: Left Lower Leg - Anterior Secondary Trauma, Other Wounding Event: Trauma Etiology: Date Acquired: 05/02/2017 Wound Status: Open Weeks Of Treatment: 32 Comorbid Anemia, Hypertension, Type II Diabetes, Clustered Wound: No History: Osteoarthritis Photos Photo Uploaded By: Montey Hora on  01/13/2018 11:59:45 Wound Measurements Length: (cm) 6.6 Width: (cm) 2.5 Depth: (cm) 0.2 Area: (cm) 12.959 Volume: (cm) 2.592 % Reduction in Area: -169.6% % Reduction in Volume: -169.7% Epithelialization: Small (1-33%) Tunneling: No Undermining: No Wound Description Classification: Grade 2 Wound Margin: Flat and Intact Exudate Amount: Large Exudate Type: Serosanguineous Exudate Color: red, brown Foul Odor After Cleansing: No Slough/Fibrino Yes Wound Bed Granulation Amount: Medium (34-66%) Exposed Structure Granulation Quality: Red Fascia Exposed: Yes Necrotic Amount: Medium (34-66%) Fat Layer (Subcutaneous Tissue) Exposed: Yes Necrotic Quality: Adherent Slough Tendon Exposed: No Muscle Exposed: Yes Necrosis of Muscle: No Joint Exposed: No Bone Exposed: Yes [name], [name]. ([id number]) Periwound Skin Texture Texture Color No Abnormalities Noted: No No Abnormalities Noted: No Callus: No Atrophie Blanche: No Crepitus: No Cyanosis: No Excoriation: No Ecchymosis: No Induration: No Erythema: No Rash: No Hemosiderin Staining: Yes Scarring: Yes Mottled: No Pallor: No Moisture Rubor: No No Abnormalities Noted: No Dry / Scaly: No Temperature / Pain Maceration: Yes Temperature: No Abnormality Tenderness on Palpation: Yes Wound Preparation Ulcer Cleansing: Rinsed/Irrigated with Saline Topical Anesthetic Applied: Other: lidocaine 4%, Treatment Notes Wound #1 (Left, Anterior Lower Leg) 1. Cleansed with: Clean wound with Normal Saline 4. Dressing Applied: Prisma Ag Mepitel 5. Secondary Dressing Applied Dry Gauze Kerlix/Conform 7. Secured with Tape Notes Mepitel One and hydrogel over bone, Prisma over rest of area, netting Electronic Signature(s) Signed: 01/12/2018 4:09:49 PM By: Montey Hora Entered By: Montey Hora on 01/12/2018 11:31:01 [name], Alexa Hill.  ([id number]) -------------------------------------------------------------------------------- Vitals Details Patient Name: [name]. Date of Service: 01/12/2018 11:00 AM Medical Record Number: [id number] Patient Account Number: [id number] Date of Birth/Sex: 12-09-1925 (84 y.o. Female) Treating RN: Montey Hora Primary Care Laelah Siravo: Lucianne Lei Other Clinician: Referring Annis Lagoy: Lucianne Lei Treating Shilo Pauwels/Extender: Tito Dine in Treatment: 32 Vital Signs Time Taken: 11:26 Temperature (F): 97.8 Height (in): 65 Pulse (bpm): 95 Weight (lbs): 131 Respiratory Rate (  breaths/min): 16 Body Mass Index (BMI): 21.8 Blood Pressure (mmHg): 133/80 Reference Range: 80 - 120 mg / dl Electronic Signature(s) Signed: 01/12/2018 4:09:49 PM By: Montey Hora Entered By: Montey Hora on 01/12/2018 11:26:07

## 2018-01-19 ENCOUNTER — Encounter: Payer: Medicare Other | Admitting: Internal Medicine

## 2018-01-19 DIAGNOSIS — E11622 Type 2 diabetes mellitus with other skin ulcer: Secondary | ICD-10-CM | POA: Diagnosis not present

## 2018-01-23 NOTE — Progress Notes (Addendum)
DANETTA, PROM ([id number]) Visit Report for 01/19/2018 Cellular or Tissue Based Product Details Patient Name: [name], [name] Date of Service: 01/19/2018 3:00 PM Medical Record Number: [id number] Patient Account Number: [id number] Date of Birth/Sex: [birth year] (84 y.o. Female) Treating RN: Cornell Barman Primary Care Provider: Lucianne Lei Other Clinician: Referring Provider: Lucianne Lei Treating Provider/Extender: Tito Dine in Treatment: 33 Cellular or Tissue Based Wound #1 Left,Anterior Lower Leg Product Type Applied to: Performed By: Physician [name], MD Cellular or Tissue Based Other Product Type: Pre-procedure Verification/Time Yes - 13:15 Out Taken: Location: trunk / arms / legs Wound Size (sq cm): 14.72 Product Size (sq cm): 12.75 Waste Size (sq cm): 0 Amount of Product Applied (sq cm): 12.75 Instrument Used: Forceps Lot #: [id number] Order #: [id number] Expiration Date: 08/02/2018 Fenestrated: No Reconstituted: Yes Solution Type: nacl Solution Amount: 62ml Lot #: [id number] Solution Expiration Date: 08/31/2019 Secured: Yes Secured With: Steri-Strips Dressing Applied: Yes Primary Dressing: Mepital One Procedural Pain: 0 Post Procedural Pain: 0 Response to Treatment: Procedure was tolerated well Post Procedure Diagnosis Same as Pre-procedure Electronic Signature(s) Signed: 01/31/2018 12:56:37 PM By: Gretta Cool, BSN, RN, CWS, Kim RN, BSN Previous Signature: 01/19/2018 3:58:19 PM Version By: Gretta Cool, BSN, RN, CWS, Kim RN, BSN Entered By: Gretta Cool, BSN, RN, CWS, Kim on 01/31/2018 12:56:37 Manganiello, [name] ([id number]) -------------------------------------------------------------------------------- HPI Details Patient Name: Alexa Lowes P. Date of Service: 01/19/2018 3:00 PM Medical Record Number: [id number] Patient Account Number: [id number] Date of Birth/Sex: September 15, 1926 (84 y.o. Female) Treating RN: Cornell Barman Primary Care Provider: Lucianne Lei  Other Clinician: Referring Provider: Lucianne Lei Treating Provider/Extender: Tito Dine in Treatment: 76 History of Present Illness HPI Description: 84 year old patient was seen in the ED about 10 days ago for a history of abrasion to the left lower extremity while she was boarding a bus and scraped the anterior part of her left leg. She has been a diabetic for many years and has been taking treatment regularly. Her past medical history is also significant for anemia arthritis constipation and hypertension and is status post abdominal hysterectomy. She has never been a smoker. after her ER visit she was advised to apply Bactroban ointment to the wound twice a day and continue to monitor her blood glucose levels. her last hemoglobin A1c was done 3 years ago and was 6.6% 06/04/17 patient left anterior shin wound appears to be doing well although it is still somewhat dry despite the treatment with Medihoney. We have been using Kerlex following the Medihoney application and I believe this is just not retaining that much moisture. Nonetheless there is no evidence of infection. 06/11/17 on evaluation today patient appears to be doing a little bit worse in regard to her wound. The entirety of the wound is dry and unfortunately the Medihoney does not seem to be helping this. That is even with the dressing changes that I made last week to try to retain more moisture. She has not tried [employer] as of yet. She was also noncompressible when testing for ABIs. 07/01/2017 -- she had a arterial studies done and was seen by Dr. Lucky Cowboy. her noninvasive studies showed noncompressible vessels on the right but brisk waveforms and digital pressures of 71 on the right consistent with only mild arterial insufficienc. On the left her ABI was 0.47 and this may be falsely elevated due to calcification. No digital pressures were obtained on the left and this is consistent with severe arterial insufficiency. this  critical limb threatening situation on the  left, would make wound healing difficult and it represents a serious situation and he has recommended an angiography with possible revascularization. The patient desired to defer the decision to she discuss with the family members. 07/09/17 on evaluation today patient appears to be doing fairly well in regard to her left anterior lower extremity wound. She has been tolerating the dressing changes without complication we have been utilizing Santyl at this time. She tells me that she is having no significant pain compared to what she has had in the past. She has decided after discussing with family that she is going to go forward with the surgery with Dr. dew. This is to restore better blood flow to left lower extremity. With that it does appear that her lower extremity wound is making some progress albeit slow. No fevers, chills, nausea, or vomiting noted at this time. 07/16/17 on evaluation today patient's wound appears to be doing roughly about the same although some of the slough is clearing off. [name] she has her appointment with vascular next week on Thursday the 23rd. With that being said her wound does not appear to be hurting as badly as it has in the past which is good news. No fevers, chills, nausea, or vomiting noted at this time. 07/30/2017 -- she had surgery on 07/22/2017 --indications being nonhealing ulcer on the left leg with a noninvasive study showing marked reduction in ABI of less than 0.5 and no digital pressure on her left leg. she had a percutaneous transluminal angioplasty of the left common and external iliac arteries. She then had a stent placed to the left external iliac arteries and to the left common iliac artery. 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 The flow distally was poor and there were multiple areas of high-grade stenosis  or occlusion of the distal SFA and the popliteal artery with only a diseased peroneal artery as runoff distally. She has a postop appointment on September 23 08/20/2017 -- she went back to the vascular office for an appointment on 08/12/2017 and bilateral ABIs were done which were notable for moderate right lower extremity arterial disease and unable to obtain left ankle brachial indices due to absent Doppler velocities in the posterior tibial artery and the anterior tibial artery. Monophasic waveforms to the left femoral and popliteal artery. this was worrisome for a failure of stents placed and lower extremity angiogram was recommended. as was RAIANNA, SLIGHT ([id number]) done on 08/16/2017 and the stents placed in the left eye Lex system had some mild narrowing at the iliac bifurcation and the leading edge of the stent proximally was poor wall apposition with stenosis in the 50-60% range in the right common iliac artery. There was also occlusion of the common femoral artery and the origin of the SFA and profunda femoris artery. Flow distally was poor but they appeared to be multiple areas of high-grade stenosis or occlusion in the distal SFA and popliteal artery with only a diseased peroneal artery. in another stent was placed and deployed. The femoral occlusion would need to be treated surgically. 08/27/17 on evaluation today patient appears to be doing about the same in regard to her left for extremity wound. Unfortunately secondary to her vascular status this is not a very well healing wound at this time. She has been tolerating the dressing changes she has some discomfort but mainly with cleansing of the wound not at other times. No  fevers, chills, nausea, or vomiting noted at this time. 09/17/2017 -- the patient was recently discharged from the hospital where she was admitted between 09/08/2017 and 09/10/2017, when she had a procedure on her left femoral artery with the endarterectomy  and a patch. she will be going back for review this coming week. 09/24/2017 -- was seen on 09/18/2017 in the ER, for bleeding from her wound which was debrided earlier during the day. At the time she was seen there was no active bleeding. A thrombin pad was placed over the area and she was asked to change the outside dressing but come back to the wound center as planned. she was also seen at the [facility] office for a review after her surgery and bilateral lower extremity ABI when compared to the previous ones showed minimal improvement of arterial blood flow. She was set up for a CT angiogram of the abdomen and pelvis and lower extremities to see the degree of peripheral arterial disease 10/28/2017 -- had a x-ray of the left tibia and fibula done on 10/07/2017 -- showed no bony acute abnormality she was reviewed by Dr. Corene Cornea dew on 10/26/2017, and he reviewed her CT angiogram and this demonstrated a widely patent left femoral endarterectomy including the origins of the femoral artery. She also had moderate distal disease in the popliteal artery, distal SFA and tibial disease. Her peroneal artery is a dominant runoff distally.he felt she had adequate perfusion for healing the mid calf level and he is putting off all surgical intervention for now and is going to see her back in 2-3 months for noninvasive studies. She also has a new wound on the plantar aspect of the left big toe where she pulled a piece of skin and has caused a superficial ulceration 11/19/17 on evaluation today patient appears to be doing decently well at this point in time. It does appear that her Wound VAC has been approved as ordered by Dr. Con Memos however she has not received that as of yet. Nonetheless the wound bed in general appears to have minimal slough noted at this time in a fairly good amount of granulation although there is to be a noted in the center of the wound. No fevers, chills, nausea, or  vomiting noted at this time. 12/02/17 on evaluation today patient appears to be doing about the same in regard to her left anterior shin wound. Unfortunately her left toe wound appears to still be doing about the same as far as the eschar covering. This has softened up a bit to the point that it is now loose and could have selective debridement which would allow for the Santyl to work better. Fortunately she is having no pain. 12/10/17 on evaluation today patient appears to be doing fairly well in regard to her left anterior shin wound and that it at least appears to be stable. With that being said she does have really no significant healing at this point this wound seems to be mainly just maintaining. Subsequently patient also has the left toe wound which does not seem to be progressing. There is still nonviable tissue overlying the surface of the wound and this doesn't even seem to be loosening up very well at all. And patient has been using the Santyl. I do think this may need selective debridement today. Fortunately there does not appear to be any evidence of infection although I am concerned about the possibility that patient's blood flow is not likely sufficient to be  able to tolerate appropriate wound healing. This was discussed with patient today as well although obviously I would leave the determination up to vascular surgery. She will be seeing Dr. dew on February 5 for repeat vascular studies and then in office evaluation. 12/17/17 on evaluation today patient appears to be doing about the same in regard to her left shin and left great toe ulcers. She has been performing the dressing changes as previously recommended over the past week. With that being said unfortunately nothing seems to be changing much in regard to the dimensions of the wound and I do feel again that this may be related to vascular flow. Patient's daughter was present during the evaluation today and we did discuss this as  well. I will be detailed in greater detail in the plan. [name], [name] ([id number]) 12/23/17 she is here in follow up evaluation of a left great toe and left anterior leg ulcer. There is noted ischemic changes (urple, dry gangrene, shriveling effect laterally) to the left great toe, although not overtly cold. Contact was made to Dr Ozella Almond office regarding possibility of seeing her in the office today or tomorrow as this is an acute change; they recommended she go to the emergency department for evaluation by Dr Ozella Almond partner Dr Curley Spice. The ER was contacted, spoke with Tiffany, regarding this. Patient was accompanied by her son. The left anterior shin ulcer is improved in measurement, overtly exposed bone with area of discoloration/necrosis. will follow up after hospitalization/vascular intervention 12/31/17 on evaluation today patient is status post having gone to the ER last week on 12/23/17. 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 This was performed by Dr. Cleda Mccreedy on 12/23/17. Subsequently Dr. Corene Cornea do with [facility] specialist performed percutaneous evaluation and intervention of patient's left lower extremity on 12/27/17. Angioplasty was performed following angiogram of the left peroneal artery and tibial angioplasty was also performed of the left popliteal artery. Angioplasty of the mid and distal superficial femoral arteries was likewise undertaken. Stents were placed in the left above knee popliteal artery following angioplasty and then a second stent placed in the mid- SFA secondary to 50% residual stenosis following the angioplasty. Fortunately patient was much better post procedure as far as stenosis/occlusions were concerned and she did have definitely much better blood flow which was noted to be evident on evaluation today. 01/07/18 on evaluation today patient appears to be doing well on her left lower extremity in regard to the anterior shin ulcer.  She has been tolerating the dressing changes without complication. The good news is nothing appears to be doing worse and in general she does seem to have more granulation tissue. There is still bone exposure. Fortunately she's been tolerating the dressing changes. Patient's a the eyes that Dr. Bunnie Domino office on 01/04/18 revealed a right ABI of 0.69 and a left ABI of 0.81 obviously this is doing very well and significantly improved. 01/12/18; patient with a traumatic wound on the left anterior tibial area. Fairly substantial wound was still open tibia however the amount of exposed bone is apparently a lot better. We have applied for TheraSkins but have not yet received approval. She has been using silver collagen as mentioned everything appears better. She had recent revascularization by [facility] and had a left ABI of 0.81. 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 This is still wrapped and I did not look at this today, apparently  we are not following secondary to this being a recent surgical wound. 01/19/18; patient with a traumatic wound on the left anterior tibial area. Only a small amount of open tibia still remains. We applied Theraskin #1. She has an another wound at the right great toe amputation site. This is being followed by her surgeon on a 92 day global. Our nurse inadvertently remove the wet to dry dressing and reported there is exposed bone. We are not following this as of yet Electronic Signature(s) Signed: 01/19/2018 5:05:40 PM By: Linton Ham MD Entered By: Linton Ham on 01/19/2018 15:55:58 Edinger, [name] ([id number]) -------------------------------------------------------------------------------- Physical Exam Details Patient Name: Alexa Lowes P. Date of Service: 01/19/2018 3:00 PM Medical Record Number: [id number] Patient Account Number: [id number] Date of Birth/Sex: Jul 25, 1926 (84 y.o. Female) Treating RN: Cornell Barman Primary  Care Provider: Lucianne Lei Other Clinician: Referring Provider: Lucianne Lei Treating Provider/Extender: Tito Dine in Treatment: 43 Constitutional Patient is hypertensive.. Pulse regular and within target range for patient.Marland Kitchen Respirations regular, non-labored and within target range.. Temperature is normal and within the target range for the patient.Marland Kitchen appears in no distress. Notes Wound exam; wound is on the anterior left tibial area. Still a large wound with healthy looking granulation. No debridement was required. Her area of exposed tibia is smaller and on the superior lateral part of the wound surface from roughly 9 to 12:00. TheraSkin #1 applied Engineer, maintenance) Signed: 01/19/2018 5:05:40 PM By: Linton Ham MD Entered By: Linton Ham on 01/19/2018 15:57:29 Arko, [name] ([id number]) -------------------------------------------------------------------------------- Physician Orders Details Patient Name: Alexa Lowes P. Date of Service: 01/19/2018 3:00 PM Medical Record Number: [id number] Patient Account Number: [id number] Date of Birth/Sex: 03/14/1926 (84 y.o. Female) Treating RN: Roger Shelter Primary Care Provider: Lucianne Lei Other Clinician: Referring Provider: Lucianne Lei Treating Provider/Extender: Tito Dine in Treatment: 25 Verbal / Phone Orders: No Diagnosis Coding Wound Cleansing Wound #1 Left,Anterior Lower Leg o Clean wound with Normal Saline. o Cleanse wound with mild soap and water Anesthetic (add to Medication List) Wound #1 Left,Anterior Lower Leg o Topical Lidocaine 4% cream applied to wound bed prior to debridement (In Clinic Only). Skin Barriers/Peri-Wound Care Wound #1 Left,Anterior Lower Leg o Skin Prep Primary Wound Dressing Wound #1 Left,Anterior Lower Leg o Dry Gauze Secondary Dressing o ABD pad Wound #1 Left,Anterior Lower Leg o Dry Gauze o Other - kerlix Dressing Change  Frequency Wound #1 Left,Anterior Lower Leg o Change dressing every week - Theraskin applied: do not remove anything beyond the steri-strips. Follow-up Appointments Wound #1 Left,Anterior Lower Leg o Return Appointment in 2 weeks. Home Health Wound #1 [facility] Visits - [facility] Nurse may visit PRN to address patientos wound care needs. o FACE TO FACE ENCOUNTER: MEDICARE and MEDICAID PATIENTS: I certify that this patient is under my care and that I had a face-to-face encounter that meets the physician face-to-face encounter requirements with this patient on this date. The encounter with the patient was in whole or in part for the following MEDICAL CONDITION: (primary reason for [place]) MEDICAL NECESSITY: I certify, that based on my findings, NURSING services are a medically necessary home health service. HOME BOUND STATUS: I certify that my clinical findings support that this patient is homebound (i.e., Due to illness or injury, pt requires aid of supportive devices such as crutches, cane, wheelchairs, walkers, the use of special transportation or the Askari, Josephyne P. ([id number]) assistance of another person to leave  their place of residence. There is a normal inability to leave the home and doing so requires considerable and taxing effort. Other absences are for medical reasons / religious services and are infrequent or of short duration when for other reasons). o If current dressing causes regression in wound condition, may D/C ordered dressing product/s and apply Normal Saline Moist Dressing daily until next [place] / Other MD appointment. [place] of regression in wound condition at [phone]. o Please direct any NON-WOUND related issues/requests for orders to patient's Primary Care Physician Advanced Therapies Wound #1 Left,Anterior Lower Leg o Theraskin application in  clinic; including contact layer, fixation with steri strips, dry gauze and cover dressing. - Theraskin must stay in place with mepitel for 2 weeks with steri strips intact. HHRN - DO NOT REMOVE THIS DRESSING Patient Medications Allergies: No Known Drug Allergies Notifications Medication Indication Start End lidocaine DOSE topical 4 % cream - cream topical Electronic Signature(s) Signed: 01/19/2018 5:05:40 PM By: Linton Ham MD Signed: 01/21/2018 4:58:29 PM By: Roger Shelter Entered By: Roger Shelter on 01/19/2018 15:50:43 Packham, [name] ([id number]) -------------------------------------------------------------------------------- Prescription 01/19/2018 Patient Name: Alexa Lowes P. Provider: [name] MD Date of Birth: 05-Jun-1926 NPI#: [id number] Sex: F DEA#: [id number] Phone #: [phone] License #: [id number] Patient Address: [facility] [address] [place], [place] [address] [address] [place], [place] [address] [phone] Allergies No Known Drug Allergies Medication Medication: Route: Strength: Form: lidocaine 4 % topical cream topical 4% cream Class: TOPICAL LOCAL ANESTHETICS Dose: Frequency / Time: Indication: cream topical Number of Refills: Number of Units: 0 Generic Substitution: Start Date: End Date: One Time Use: Substitution Permitted No Note to Pharmacy: Signature(s): Date(s): Electronic Signature(s) Signed: 01/19/2018 5:05:40 PM By: Linton Ham MD Signed: 01/21/2018 4:58:29 PM By: Roger Shelter Entered By: Roger Shelter on 01/19/2018 15:50:43 Acevedo, Shiasia P. ([id number]) --------------------------------------------------------------------------------  Problem List Details Patient Name: Alexa Lowes P. Date of Service: 01/19/2018 3:00 PM Medical Record Number: [id number] Patient Account Number: [id number] Date of  Birth/Sex: [birth year] (84 y.o. 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 ([id number]) -------------------------------------------------------------------------------- Progress Note Details Patient Name: Alexa Lowes P. Date of Service: 01/19/2018 3:00 PM Medical Record Number: [id number] Patient Account Number: [id number] Date of Birth/Sex: [birth year] (84 y.o. Female) Treating RN: Cornell Barman Primary Care Provider: Lucianne Lei Other Clinician: Referring Provider: Lucianne Lei Treating Provider/Extender: Tito Dine in Treatment: 33 Subjective History of Present Illness (HPI) 84 year old patient was seen in the ED about 10 days ago for a history of abrasion to the left lower extremity while she was boarding a bus and scraped the anterior part of her left leg. She has been a diabetic for many years and  has been taking treatment regularly. Her past medical history is also significant for anemia arthritis constipation and hypertension and is status post abdominal hysterectomy. She has never been a smoker. after her  ER visit she was advised to apply Bactroban ointment to the wound twice a day and continue to monitor her blood glucose levels. her last hemoglobin A1c was done 3 years ago and was 6.6% 06/04/17 patient left anterior shin wound appears to be doing well although it is still somewhat dry despite the treatment with Medihoney. We have been using Kerlex following the Medihoney application and I believe this is just not retaining that much moisture. Nonetheless there is no evidence of infection. 06/11/17 on evaluation today patient appears to be doing a little bit worse in regard to her wound. The entirety of the wound is dry and unfortunately the Medihoney does not seem to be helping this. That is even with the dressing changes that I made last week to try to retain more moisture. She has not tried [employer] as of yet. She was also noncompressible when testing for ABIs. 07/01/2017 -- she had a arterial studies done and was seen by Dr. Lucky Cowboy. her noninvasive studies showed noncompressible vessels on the right but brisk waveforms and digital pressures of 71 on the right consistent with only mild arterial insufficienc. On the left her ABI was 0.47 and this may be falsely elevated due to calcification. No digital pressures were obtained on the left and this is consistent with severe arterial insufficiency. this critical limb threatening situation on the left, would make wound healing difficult and it represents a serious situation and he has recommended an angiography with possible revascularization. The patient desired to defer the decision to she discuss with the family members. 07/09/17 on evaluation today patient appears to be doing fairly well in regard to her left anterior lower extremity wound. She has been tolerating the dressing changes without complication we have been utilizing Santyl at this time. She tells me that she is having no significant pain compared to what she has had in the past. She has  decided after discussing with family that she is going to go forward with the surgery with Dr. dew. This is to restore better blood flow to left lower extremity. With that it does appear that her lower extremity wound is making some progress albeit slow. No fevers, chills, nausea, or vomiting noted at this time. 07/16/17 on evaluation today patient's wound appears to be doing roughly about the same although some of the slough is clearing off. [name] she has her appointment with vascular next week on Thursday the 23rd. With that being said her wound does not appear to be hurting as badly as it has in the past which is good news. No fevers, chills, nausea, or vomiting noted at this time. 07/30/2017 -- she had surgery on 07/22/2017 --indications being nonhealing ulcer on the left leg with a noninvasive study showing marked reduction in ABI of less than 0.5 and no digital pressure on her left leg. she had a percutaneous transluminal angioplasty of the left common and external iliac arteries. She then had a stent placed to the left external iliac arteries and to the left common iliac artery. 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 The flow distally was poor and there were multiple areas of high-grade stenosis or occlusion of the distal SFA and the popliteal artery with only  a diseased peroneal artery as runoff distally. She has a postop appointment on September 23 08/20/2017 -- she went back to the vascular office for an appointment on 08/12/2017 and bilateral ABIs were done which were notable for moderate right lower extremity arterial disease and unable to obtain left ankle brachial indices due to absent Doppler velocities in the posterior tibial artery and the anterior tibial artery. Monophasic waveforms to the left femoral and Bevans, [name] ([id number]) popliteal artery. this was worrisome for a failure of  stents placed and lower extremity angiogram was recommended. as was done on 08/16/2017 and the stents placed in the left eye Lex system had some mild narrowing at the iliac bifurcation and the leading edge of the stent proximally was poor wall apposition with stenosis in the 50-60% range in the right common iliac artery. There was also occlusion of the common femoral artery and the origin of the SFA and profunda femoris artery. Flow distally was poor but they appeared to be multiple areas of high-grade stenosis or occlusion in the distal SFA and popliteal artery with only a diseased peroneal artery. in another stent was placed and deployed. The femoral occlusion would need to be treated surgically. 08/27/17 on evaluation today patient appears to be doing about the same in regard to her left for extremity wound. Unfortunately secondary to her vascular status this is not a very well healing wound at this time. She has been tolerating the dressing changes she has some discomfort but mainly with cleansing of the wound not at other times. No fevers, chills, nausea, or vomiting noted at this time. 09/17/2017 -- the patient was recently discharged from the hospital where she was admitted between 09/08/2017 and 09/10/2017, when she had a procedure on her left femoral artery with the endarterectomy and a patch. she will be going back for review this coming week. 09/24/2017 -- was seen on 09/18/2017 in the ER, for bleeding from her wound which was debrided earlier during the day. At the time she was seen there was no active bleeding. A thrombin pad was placed over the area and she was asked to change the outside dressing but come back to the wound center as planned. she was also seen at the [facility] office for a review after her surgery and bilateral lower extremity ABI when compared to the previous ones showed minimal improvement of arterial blood flow. She was set up for a CT  angiogram of the abdomen and pelvis and lower extremities to see the degree of peripheral arterial disease 10/28/2017 -- had a x-ray of the left tibia and fibula done on 10/07/2017 -- showed no bony acute abnormality she was reviewed by Dr. Corene Cornea dew on 10/26/2017, and he reviewed her CT angiogram and this demonstrated a widely patent left femoral endarterectomy including the origins of the femoral artery. She also had moderate distal disease in the popliteal artery, distal SFA and tibial disease. Her peroneal artery is a dominant runoff distally.he felt she had adequate perfusion for healing the mid calf level and he is putting off all surgical intervention for now and is going to see her back in 2-3 months for noninvasive studies. She also has a new wound on the plantar aspect of the left big toe where she pulled a piece of skin and has caused a superficial ulceration 11/19/17 on evaluation today patient appears to be doing decently well at this point in time. It does appear that her Wound VAC has been  approved as ordered by Dr. Con Memos however she has not received that as of yet. Nonetheless the wound bed in general appears to have minimal slough noted at this time in a fairly good amount of granulation although there is to be a noted in the center of the wound. No fevers, chills, nausea, or vomiting noted at this time. 12/02/17 on evaluation today patient appears to be doing about the same in regard to her left anterior shin wound. Unfortunately her left toe wound appears to still be doing about the same as far as the eschar covering. This has softened up a bit to the point that it is now loose and could have selective debridement which would allow for the Santyl to work better. Fortunately she is having no pain. 12/10/17 on evaluation today patient appears to be doing fairly well in regard to her left anterior shin wound and that it at least appears to be stable. With that being said she does have  really no significant healing at this point this wound seems to be mainly just maintaining. Subsequently patient also has the left toe wound which does not seem to be progressing. There is still nonviable tissue overlying the surface of the wound and this doesn't even seem to be loosening up very well at all. And patient has been using the Santyl. I do think this may need selective debridement today. Fortunately there does not appear to be any evidence of infection although I am concerned about the possibility that patient's blood flow is not likely sufficient to be able to tolerate appropriate wound healing. This was discussed with patient today as well although obviously I would leave the determination up to vascular surgery. She will be seeing Dr. dew on February 5 for repeat vascular studies and then in office evaluation. 12/17/17 on evaluation today patient appears to be doing about the same in regard to her left shin and left great toe ulcers. She has been performing the dressing changes as previously recommended over the past week. With that being said unfortunately nothing seems to be changing much in regard to the dimensions of the wound and I do feel again that this may be related to vascular flow. Patient's daughter was present during the evaluation today and we did discuss this as well. I will be detailed in greater detail in the plan. [name], [name] ([id number]) 12/23/17 she is here in follow up evaluation of a left great toe and left anterior leg ulcer. There is noted ischemic changes (urple, dry gangrene, shriveling effect laterally) to the left great toe, although not overtly cold. Contact was made to Dr Ozella Almond office regarding possibility of seeing her in the office today or tomorrow as this is an acute change; they recommended she go to the emergency department for evaluation by Dr Ozella Almond partner Dr Curley Spice. The ER was contacted, spoke with Tiffany, regarding this. Patient was  accompanied by her son. The left anterior shin ulcer is improved in measurement, overtly exposed bone with area of discoloration/necrosis. will follow up after hospitalization/vascular intervention 12/31/17 on evaluation today patient is status post having gone to the ER last week on 12/23/17. 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 This was performed by Dr. Cleda Mccreedy on 12/23/17. Subsequently Dr. Corene Cornea do with [facility] performed percutaneous evaluation and intervention of patient's left lower extremity on 12/27/17. Angioplasty was performed following angiogram of the left peroneal artery and tibial angioplasty was also performed  of the left popliteal artery. Angioplasty of the mid and distal superficial femoral arteries was likewise undertaken. Stents were placed in the left above knee popliteal artery following angioplasty and then a second stent placed in the mid- SFA secondary to 50% residual stenosis following the angioplasty. Fortunately patient was much better post procedure as far as stenosis/occlusions were concerned and she did have definitely much better blood flow which was noted to be evident on evaluation today. 01/07/18 on evaluation today patient appears to be doing well on her left lower extremity in regard to the anterior shin ulcer. She has been tolerating the dressing changes without complication. The good news is nothing appears to be doing worse and in general she does seem to have more granulation tissue. There is still bone exposure. Fortunately she's been tolerating the dressing changes. Patient's a the eyes that Dr. Bunnie Domino office on 01/04/18 revealed a right ABI of 0.69 and a left ABI of 0.81 obviously this is doing very well and significantly improved. 01/12/18; patient with a traumatic wound on the left anterior tibial area. Fairly substantial wound was still open tibia however the amount of exposed bone is apparently a lot  better. We have applied for TheraSkins but have not yet received approval. She has been using silver collagen as mentioned everything appears better. She had recent revascularization by [facility] and had a left ABI of 0.81. 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 This is still wrapped and I did not look at this today, apparently we are not following secondary to this being a recent surgical wound. 01/19/18; patient with a traumatic wound on the left anterior tibial area. Only a small amount of open tibia still remains. We applied Theraskin #1. She has an another wound at the right great toe amputation site. This is being followed by her surgeon on a 49 day global. Our nurse inadvertently remove the wet to dry dressing and reported there is exposed bone. We are not following this as of yet Objective Constitutional Patient is hypertensive.. Pulse regular and within target range for patient.Marland Kitchen Respirations regular, non-labored and within target range.. Temperature is normal and within the target range for the patient.Marland Kitchen appears in no distress. Vitals Time Taken: 2:53 AM, Height: 65 in, Weight: 131 lbs, BMI: 21.8, Temperature: 97.5 F, Pulse: 86 bpm, Respiratory Rate: 16 breaths/min, Blood Pressure: 146/67 mmHg. General Notes: Wound exam; wound is on the anterior left tibial area. Still a large wound with healthy looking granulation. No debridement was required. Her area of exposed tibia is smaller and on the superior lateral part of the wound surface from roughly 9 to 12:00. TheraSkin #1 applied Integumentary (Hair, Skin) Wound #1 status is Open. Original cause of wound was Trauma. The wound is located on the Left,Anterior Lower Leg. The Trembath, Foster P. ([id number]) wound measures 6.4cm length x 2.3cm width x 0.2cm depth; 11.561cm^2 area and 2.312cm^3 volume. There is bone, muscle, Fat Layer (Subcutaneous Tissue) Exposed, and fascia exposed. There is no  tunneling or undermining noted. There is a large amount of serosanguineous drainage noted. The wound margin is flat and intact. There is medium (34-66%) red granulation within the wound bed. There is a medium (34-66%) amount of necrotic tissue within the wound bed including Adherent Slough. The periwound skin appearance exhibited: Scarring, Maceration, Hemosiderin Staining. The periwound skin appearance did not exhibit: Callus, Crepitus, Excoriation, Induration, Rash, Dry/Scaly, Atrophie Blanche, Cyanosis, Ecchymosis, Mottled, Pallor, Rubor, Erythema. Periwound temperature was  noted as No Abnormality. The periwound has tenderness on palpation. 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 A skin graft procedure using a bioengineered skin substitute/cellular or tissue based product was performed by [name], MD with the following instrument(s): Forceps. Other was applied and secured with Steri-Strips. 12.75 sq cm of product was utilized and 0 sq cm was wasted. Post Application, Mepital One was applied. A Time Out was conducted at 13:15, prior to the start of the procedure. The procedure was tolerated well with a pain level of 0 throughout and a pain level of 0 following the procedure. Post procedure Diagnosis Wound #1: Same as Pre-Procedure . Plan Wound Cleansing: Wound #1 Left,Anterior Lower Leg: Clean wound with Normal Saline. Cleanse wound  with mild soap and water Anesthetic (add to Medication List): Wound #1 Left,Anterior Lower Leg: Topical Lidocaine 4% cream applied to wound bed prior to debridement (In Clinic Only). Skin Barriers/Peri-Wound Care: Wound #1 Left,Anterior Lower Leg: Skin Prep Primary Wound Dressing: Wound #1 Left,Anterior Lower Leg: [name], [name] ([id number]) Dry Gauze Secondary Dressing: ABD pad Wound #1 Left,Anterior Lower Leg: Dry Gauze Other - kerlix Dressing Change Frequency: Wound #1 Left,Anterior Lower Leg: Change dressing every week - Theraskin applied: do not remove anything beyond the steri-strips. Follow-up Appointments: Wound #1 Left,Anterior Lower Leg: Return Appointment in 2 weeks. Home Health: Wound #1 Left,Anterior Lower Leg: [place] [facility] - [place] Nurse may visit PRN to address patient s wound care needs. FACE TO FACE ENCOUNTER: MEDICARE and MEDICAID PATIENTS: I certify that this patient is under my care and that I had a face-to-face encounter that meets the physician face-to-face encounter requirements with this patient on this date. The encounter with the patient was in whole or in part for the following MEDICAL CONDITION: (primary reason for [place]) MEDICAL NECESSITY: I certify, that based on my findings, NURSING services are a medically necessary home health service. HOME BOUND STATUS: I certify that my clinical findings support that this patient is homebound (i.e., Due to illness or injury, pt requires aid of supportive devices such as crutches, cane, wheelchairs, walkers, the use of special transportation or the assistance of another person to leave their place of residence. There is a normal inability to leave the home and doing so requires considerable and taxing effort. Other absences are for medical reasons / religious services and are infrequent or of short duration when for other reasons). If current dressing causes regression in  wound condition, may D/C ordered dressing product/s and apply Normal Saline Moist Dressing daily until next [place] / Other MD appointment. Terminous of regression in wound condition at [phone]. Please direct any NON-WOUND related issues/requests for orders to patient's Primary Care Physician Advanced Therapies: Wound #1 Left,Anterior Lower Leg: Theraskin application in clinic; including contact layer, fixation with steri strips, dry gauze and cover dressing. - Theraskin must stay in place with mepitel for 2 weeks with steri strips intact. HHRN -  DO NOT REMOVE THIS DRESSING The following medication(s) was prescribed: lidocaine topical 4 % cream cream topical was prescribed at facility 1 TheraSkin #1 2 doing well 3 [facility] to change secondary dressings next week Electronic Signature(s) Signed: 02/01/2018 3:51:45 PM By: Gretta Cool, BSN, RN, CWS, Kim RN, BSN Signed: 02/09/2018 5:42:24 PM By: Linton Ham MD Previous Signature: 01/19/2018 5:05:40 PM Version By: Linton Ham MD Entered By: Gretta Cool, BSN, RN, CWS, Kim on 02/01/2018 15:51:44 [place], [name] ([id number]) -------------------------------------------------------------------------------- SuperBill Details Patient Name: Alexa Lowes P. Date of Service: 01/19/2018 Medical Record Number: [id number] Patient Account Number: [id number] Date of Birth/Sex: Mar 05, 1926 (84 y.o. 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 L97.824 Non-pressure chronic ulcer of other part of left lower leg with  necrosis of bone I70.245 Atherosclerosis of native arteries of left leg with ulceration of other part of foot Facility Procedures CPT4 Code: 81103159 Description: 45859 - SKIN SUB GRAFT TRNK/ARM/LEG ICD-10 Diagnosis Description E11.622 Type 2 diabetes mellitus with other skin ulcer Modifier: Quantity: 1 CPT4 Code: [id number] Description: M3817- Theraskin per 1sq cm small -13 sq cm ICD-10 Diagnosis Description E11.622 Type 2 diabetes mellitus with other skin ulcer Modifier: Quantity: 13 Physician Procedures CPT4 Code: [id number] Description: 03833 - WC PHYS SKIN SUB GRAFT TRNK/ARM/LEG ICD-10 Diagnosis Description E11.622 Type 2 diabetes mellitus with other skin ulcer Modifier: Quantity: 1 Electronic Signature(s) Signed: 01/24/2018 9:23:52 AM By: Sharon Mt Signed: 02/01/2018 12:57:36 PM By: Linton Ham MD Previous Signature: 01/19/2018 5:05:40 PM Version By: Linton Ham MD Previous Signature: 01/19/2018 3:59:09 PM Version By: Gretta Cool, BSN, RN, CWS, Kim RN, BSN Entered By: Sharon Mt on 01/20/2018 13:51:20

## 2018-01-23 NOTE — Progress Notes (Signed)
[name], [name] ([id number]) Visit Report for 01/19/2018 Arrival Information Details Patient Name: [name], [name] [name]. Date of Service: 01/19/2018 3:00 PM Medical Record Number: [id number] Patient Account Number: [id number] Date of Birth/Sex: 1926/03/24 (84 y.o. Female) Treating RN: Roger Shelter Primary Care Filomeno Cromley: Lucianne Lei Other Clinician: Referring Jiovani Mccammon: Lucianne Lei Treating Avaya Mcjunkins/Extender: Tito Dine in Treatment: 15 Visit Information History Since Last Visit All ordered tests and consults were completed: No Patient Arrived: Walker Added or deleted any medications: No Arrival Time: 14:52 Any new allergies or adverse reactions: No Accompanied By: son Had a fall or experienced change in No Transfer Assistance: None activities of daily living that may affect Patient Identification Verified: Yes risk of falls: Secondary Verification Process Yes Signs or symptoms of abuse/neglect since last visito No Completed: Hospitalized since last visit: No Patient Requires Transmission-Based No Pain Present Now: No Precautions: Patient Has Alerts: Yes Patient Alerts: 01/04/2018 ABI @ [facility] (R) 0.69 (L) 0.81 Electronic Signature(s) Signed: 01/21/2018 4:58:29 PM By: Roger Shelter Entered By: Roger Shelter on 01/19/2018 14:53:31 Lichtenberger, UMPNTIRW [name]. ([id number]) -------------------------------------------------------------------------------- Encounter Discharge Information Details Patient Name: [name]. Date of Service: 01/19/2018 3:00 PM Medical Record Number: [id number] Patient Account Number: [id number] Date of Birth/Sex: 27-Feb-1926 (84 y.o. Female) Treating RN: Cornell Barman Primary Care Andrae Claunch: Lucianne Lei Other Clinician: Referring Deondrae Mcgrail: Lucianne Lei Treating Maicie Vanderloop/Extender: Tito Dine in Treatment: 78 Encounter Discharge Information Items Discharge Pain Level: 0 Discharge Condition: Stable Ambulatory Status:  Walker Discharge Destination: Home Transportation: Private Auto Accompanied By: self Schedule Follow-up Appointment: Yes Medication Reconciliation completed and No provided to Patient/Care Sheleen Conchas: Provided on Clinical Summary of Care: 01/19/2018 Form Type Recipient Paper Patient GW Electronic Signature(s) Signed: 01/19/2018 4:44:55 PM By: Montey Hora Entered By: Montey Hora on 01/19/2018 16:44:55 Ambriz, [name] ([id number]) -------------------------------------------------------------------------------- Lower Extremity Assessment Details Patient Name: [name]. Date of Service: 01/19/2018 3:00 PM Medical Record Number: [id number] Patient Account Number: [id number] Date of Birth/Sex: 12/06/1925 (84 y.o. Female) Treating RN: Roger Shelter Primary Care Jesstin Studstill: Lucianne Lei Other Clinician: Referring Lorelei Heikkila: Lucianne Lei Treating Taysen Bushart/Extender: [name] Weeks in Treatment: 33 Edema Assessment Assessed: [Left: No] [Right: No] Edema: [Left: N] [Right: o] Vascular Assessment Claudication: Claudication Assessment [Left:None] Pulses: Dorsalis Pedis Palpable: [Left:Yes] Posterior Tibial Extremity colors, hair growth, and conditions: Extremity Color: [Left:Normal] Hair Growth on Extremity: [Left:No] Temperature of Extremity: [Left:Cool] Toe Nail Assessment Left: Right: Thick: No Discolored: No Deformed: No Improper Length and Hygiene: No Electronic Signature(s) Signed: 01/21/2018 4:58:29 PM By: Roger Shelter Entered By: Roger Shelter on 01/19/2018 15:18:06 Kubota, Valora [name]. ([id number]) -------------------------------------------------------------------------------- Multi Wound Chart Details Patient Name: [name]. Date of Service: 01/19/2018 3:00 PM Medical Record Number: [id number] Patient Account Number: [id number] Date of Birth/Sex: July 02, 1926 (84 y.o. Female) Treating RN: Roger Shelter Primary Care Tacy Chavis:  Lucianne Lei Other Clinician: Referring Amaira Safley: Lucianne Lei Treating Stillman Buenger/Extender: Tito Dine in Treatment: 33 Vital Signs Height(in): 65 Pulse(bpm): 74 Weight(lbs): 131 Blood Pressure(mmHg): 146/67 Body Mass Index(BMI): 22 Temperature(F): 97.5 Respiratory Rate 16 (breaths/min): Photos: [1:No Photos] [N/A:N/A] Wound Location: [1:Left Lower Leg - Anterior] [N/A:N/A] Wounding Event: [1:Trauma] [N/A:N/A] Primary Etiology: [1:Diabetic Wound/Ulcer of the Lower Extremity] [N/A:N/A] Secondary Etiology: [1:Trauma, Other] [N/A:N/A] Comorbid History: [1:Anemia, Hypertension, Type II Diabetes, Osteoarthritis] [N/A:N/A] Date Acquired: [1:05/02/2017] [N/A:N/A] Weeks of Treatment: [1:33] [N/A:N/A] Wound Status: [1:Open] [N/A:N/A] Measurements L x W x D [1:6.4x2.3x0.2] [N/A:N/A] (cm) Area (cm) : [1:11.561] [N/A:N/A] Volume (cm) : [1:2.312] [N/A:N/A] % Reduction in Area: [1:-140.50%] [N/A:N/A] % Reduction in  Volume: [1:-140.60%] [N/A:N/A] Classification: [1:Grade 2] [N/A:N/A] Exudate Amount: [1:Large] [N/A:N/A] Exudate Type: [1:Serosanguineous] [N/A:N/A] Exudate Color: [1:red, brown] [N/A:N/A] Wound Margin: [1:Flat and Intact] [N/A:N/A] Granulation Amount: [1:Medium (34-66%)] [N/A:N/A] Granulation Quality: [1:Red] [N/A:N/A] Necrotic Amount: [1:Medium (34-66%)] [N/A:N/A] Exposed Structures: [1:Fascia: Yes Fat Layer (Subcutaneous Tissue) Exposed: Yes Muscle: Yes Bone: Yes Tendon: No Joint: No] [N/A:N/A] Epithelialization: [1:Small (1-33%)] [N/A:N/A] Periwound Skin Texture: [1:Scarring: Yes Excoriation: No Induration: No Callus: No] [N/A:N/A] Crepitus: No Rash: No Periwound Skin Moisture: Maceration: Yes N/A N/A Dry/Scaly: No Periwound Skin Color: Hemosiderin Staining: Yes N/A N/A Atrophie Blanche: No Cyanosis: No Ecchymosis: No Erythema: No Mottled: No Pallor: No Rubor: No Temperature: No Abnormality N/A N/A Tenderness on Palpation: Yes N/A N/A Wound  Preparation: Ulcer Cleansing: N/A N/A Rinsed/Irrigated with Saline Topical Anesthetic Applied: Other: lidocaine 4% Treatment Notes Electronic Signature(s) Signed: 01/19/2018 5:05:40 PM By: Linton Ham MD Entered By: Linton Ham on 01/19/2018 15:54:12 Tamms, [name] ([id number]) -------------------------------------------------------------------------------- Multi-Disciplinary Care Plan Details Patient Name: [name]. Date of Service: 01/19/2018 3:00 PM Medical Record Number: [id number] Patient Account Number: [id number] Date of Birth/Sex: 1926/11/03 (84 y.o. Female) Treating RN: Roger Shelter Primary Care Kagan Mutchler: Lucianne Lei Other Clinician: Referring Malaika Arnall: Lucianne Lei Treating Victorian Gunn/Extender: Tito Dine in Treatment: 8 Active Inactive ` Abuse / Safety / Falls / Self Care Management Nursing Diagnoses: Potential for falls Goals: Patient will remain injury free related to falls Date Initiated: 05/28/2017 Target Resolution Date: 07/23/2017 Goal Status: Active Interventions: Assess fall risk on admission and as needed Notes: ` Orientation to the Wound Care Program Nursing Diagnoses: Knowledge deficit related to the wound healing center program Goals: Patient/caregiver will verbalize understanding of the [facility] Program Date Initiated: 05/28/2017 Target Resolution Date: 07/23/2017 Goal Status: Active Interventions: Provide education on orientation to the wound center Notes: ` Wound/Skin Impairment Nursing Diagnoses: Impaired tissue integrity Goals: Ulcer/skin breakdown will have a volume reduction of 30% by week 4 Date Initiated: 05/28/2017 Target Resolution Date: 07/23/2017 Goal Status: Active Ulcer/skin breakdown will have a volume reduction of 50% by week 8 Date Initiated: 05/28/2017 Target Resolution Date: 07/23/2017 LOLA, CZERWONKA ([id number]) Goal Status: Active Ulcer/skin breakdown will have a  volume reduction of 80% by week 12 Date Initiated: 05/28/2017 Target Resolution Date: 07/23/2017 Goal Status: Active Ulcer/skin breakdown will heal within 14 weeks Date Initiated: 05/28/2017 Target Resolution Date: 07/23/2017 Goal Status: Active Interventions: Assess patient/caregiver ability to obtain necessary supplies Assess patient/caregiver ability to perform ulcer/skin care regimen upon admission and as needed Assess ulceration(s) every visit Notes: Electronic Signature(s) Signed: 01/21/2018 4:58:29 PM By: Roger Shelter Entered By: Roger Shelter on 01/19/2018 15:37:25 Reinecke, YOVZCHYI [name]. ([id number]) -------------------------------------------------------------------------------- Pain Assessment Details Patient Name: [name]. Date of Service: 01/19/2018 3:00 PM Medical Record Number: [id number] Patient Account Number: [id number] Date of Birth/Sex: 08/22/1926 (84 y.o. Female) Treating RN: Roger Shelter Primary Care Payeton Germani: Lucianne Lei Other Clinician: Referring Korrina Zern: Lucianne Lei Treating Demonte Dobratz/Extender: Tito Dine in Treatment: 33 Active Problems Location of Pain Severity and Description of Pain Patient Has Paino No Site Locations Pain Management and Medication Current Pain Management: Electronic Signature(s) Signed: 01/21/2018 4:58:29 PM By: Roger Shelter Entered By: Roger Shelter on 01/19/2018 14:53:38 Wenke, [name] ([id number]) -------------------------------------------------------------------------------- Patient/Caregiver Education Details Patient Name: [name]. Date of Service: 01/19/2018 3:00 PM Medical Record Number: [id number] Patient Account Number: [id number] Date of Birth/Gender: November 24, 1926 (84 y.o. Female) Treating RN: Montey Hora Primary Care Physician: Lucianne Lei Other Clinician: Referring Physician: Lucianne Lei Treating Physician/Extender: Linton Ham  G Weeks in Treatment:  3 Education Assessment Education Provided To: Patient Education Topics Provided Wound/Skin Impairment: Handouts: Other: wound care as ordered Methods: Demonstration, Explain/Verbal Responses: State content correctly Electronic Signature(s) Signed: 01/19/2018 4:58:37 PM By: Montey Hora Entered By: Montey Hora on 01/19/2018 16:45:16 Frank, PNTIRWER [name]. ([id number]) -------------------------------------------------------------------------------- Wound Assessment Details Patient Name: [name]. Date of Service: 01/19/2018 3:00 PM Medical Record Number: [id number] Patient Account Number: [id number] Date of Birth/Sex: 1926/04/14 (84 y.o. Female) Treating RN: Roger Shelter Primary Care Caylee Vlachos: Lucianne Lei Other Clinician: Referring Makiah Foye: Lucianne Lei Treating Renee Beale/Extender: Tito Dine in Treatment: 33 Wound Status Wound Number: 1 Primary Etiology: Diabetic Wound/Ulcer of the Lower Extremity Wound Location: Left Lower Leg - Anterior Secondary Trauma, Other Wounding Event: Trauma Etiology: Date Acquired: 05/02/2017 Wound Status: Open Weeks Of Treatment: 33 Comorbid Anemia, Hypertension, Type II Diabetes, Clustered Wound: No History: Osteoarthritis Wound Measurements Length: (cm) 6.4 Width: (cm) 2.3 Depth: (cm) 0.2 Area: (cm) 11.561 Volume: (cm) 2.312 % Reduction in Area: -140.5% % Reduction in Volume: -140.6% Epithelialization: Small (1-33%) Tunneling: No Undermining: No Wound Description Classification: Grade 2 Foul Odo Wound Margin: Flat and Intact Slough/F Exudate Amount: Large Exudate Type: Serosanguineous Exudate Color: red, brown r After Cleansing: No ibrino Yes Wound Bed Granulation Amount: Medium (34-66%) Exposed Structure Granulation Quality: Red Fascia Exposed: Yes Necrotic Amount: Medium (34-66%) Fat Layer (Subcutaneous Tissue) Exposed: Yes Necrotic Quality: Adherent Slough Tendon Exposed: No Muscle Exposed:  Yes Necrosis of Muscle: No Joint Exposed: No Bone Exposed: Yes Periwound Skin Texture Texture Color No Abnormalities Noted: No No Abnormalities Noted: No Callus: No Atrophie Blanche: No Crepitus: No Cyanosis: No Excoriation: No Ecchymosis: No Induration: No Erythema: No Rash: No Hemosiderin Staining: Yes Scarring: Yes Mottled: No Pallor: No Moisture Rubor: No No Abnormalities Noted: No Dry / Scaly: No Temperature / Pain Craney, Abrielle [name]. ([id number]) Maceration: Yes Temperature: No Abnormality Tenderness on Palpation: Yes Wound Preparation Ulcer Cleansing: Rinsed/Irrigated with Saline Topical Anesthetic Applied: Other: lidocaine 4%, Treatment Notes Wound #1 (Left, Anterior Lower Leg) 1. Cleansed with: Clean wound with Normal Saline 2. Anesthetic Topical Lidocaine 4% cream to wound bed prior to debridement 4. Dressing Applied: Mepitel Other dressing (specify in notes) 5. Secondary Dressing Applied ABD Pad Kerlix/Conform Notes theraskin applied in clinic today, netting Electronic Signature(s) Signed: 01/21/2018 4:58:29 PM By: Roger Shelter Entered By: Roger Shelter on 01/19/2018 15:04:01 Bisher, [name] ([id number]) -------------------------------------------------------------------------------- Vitals Details Patient Name: [name]. Date of Service: 01/19/2018 3:00 PM Medical Record Number: [id number] Patient Account Number: [id number] Date of Birth/Sex: 28-Jun-1926 (84 y.o. Female) Treating RN: Roger Shelter Primary Care Addilyne Backs: Lucianne Lei Other Clinician: Referring Solmon Bohr: Lucianne Lei Treating Remmie Bembenek/Extender: Tito Dine in Treatment: 33 Vital Signs Time Taken: 02:53 Temperature (F): 97.5 Height (in): 65 Pulse (bpm): 86 Weight (lbs): 131 Respiratory Rate (breaths/min): 16 Body Mass Index (BMI): 21.8 Blood Pressure (mmHg): 146/67 Reference Range: 80 - 120 mg / dl Electronic Signature(s) Signed:  01/21/2018 4:58:29 PM By: Roger Shelter Entered By: Roger Shelter on 01/19/2018 14:55:18

## 2018-01-26 ENCOUNTER — Ambulatory Visit: Payer: Medicare Other | Admitting: Internal Medicine

## 2018-02-02 ENCOUNTER — Encounter: Payer: Medicare Other | Attending: Internal Medicine | Admitting: Internal Medicine

## 2018-02-02 DIAGNOSIS — M1990 Unspecified osteoarthritis, unspecified site: Secondary | ICD-10-CM | POA: Diagnosis not present

## 2018-02-02 DIAGNOSIS — L97824 Non-pressure chronic ulcer of other part of left lower leg with necrosis of bone: Secondary | ICD-10-CM | POA: Insufficient documentation

## 2018-02-02 DIAGNOSIS — E11622 Type 2 diabetes mellitus with other skin ulcer: Secondary | ICD-10-CM | POA: Diagnosis not present

## 2018-02-02 DIAGNOSIS — Z90710 Acquired absence of both cervix and uterus: Secondary | ICD-10-CM | POA: Insufficient documentation

## 2018-02-02 DIAGNOSIS — E1151 Type 2 diabetes mellitus with diabetic peripheral angiopathy without gangrene: Secondary | ICD-10-CM | POA: Insufficient documentation

## 2018-02-02 DIAGNOSIS — Y9389 Activity, other specified: Secondary | ICD-10-CM | POA: Insufficient documentation

## 2018-02-02 DIAGNOSIS — L97822 Non-pressure chronic ulcer of other part of left lower leg with fat layer exposed: Secondary | ICD-10-CM | POA: Insufficient documentation

## 2018-02-02 DIAGNOSIS — S80812A Abrasion, left lower leg, initial encounter: Secondary | ICD-10-CM | POA: Diagnosis present

## 2018-02-02 DIAGNOSIS — I10 Essential (primary) hypertension: Secondary | ICD-10-CM | POA: Insufficient documentation

## 2018-02-02 DIAGNOSIS — D649 Anemia, unspecified: Secondary | ICD-10-CM | POA: Diagnosis not present

## 2018-02-02 DIAGNOSIS — K5900 Constipation, unspecified: Secondary | ICD-10-CM | POA: Diagnosis not present

## 2018-02-02 DIAGNOSIS — X58XXXA Exposure to other specified factors, initial encounter: Secondary | ICD-10-CM | POA: Diagnosis not present

## 2018-02-03 NOTE — Progress Notes (Signed)
[name], [name] ([id number]) Visit Report for 02/02/2018 Cellular or Tissue Based Product Details Patient Name: [name], [name] Date of Service: 02/02/2018 11:15 AM Medical Record Number: [id number] Patient Account Number: [id number] Date of Birth/Sex: May 29, 1926 (84 y.o. Female) Treating RN: Cornell Barman Primary Care Provider: Lucianne Lei Other Clinician: Referring Provider: Lucianne Lei Treating Provider/Extender: Tito Dine in Treatment: 71 Cellular or Tissue Based Wound #1 Left,Anterior Lower Leg Product Type Applied to: Performed By: Physician [name], MD Cellular or Tissue Based Theraskin Product Type: Pre-procedure Verification/Time Yes - 11:57 Out Taken: Location: trunk / arms / legs Wound Size (sq cm): 11.4 Product Size (sq cm): 13 Waste Size (sq cm): 0 Amount of Product Applied (sq cm): 13 Instrument Used: Forceps Lot #: [id number] Order #: [id number] Expiration Date: 02/25/2021 Secured: Yes Secured With: Steri-Strips Dressing Applied: Yes Primary Dressing: mepitel one Procedural Pain: 0 Post Procedural Pain: 0 Response to Treatment: Procedure was tolerated well Post Procedure Diagnosis Same as Pre-procedure Electronic Signature(s) Signed: 02/02/2018 5:10:26 PM By: Linton Ham MD Entered By: Linton Ham on 02/02/2018 12:32:21 [name], [name] ([id number]) -------------------------------------------------------------------------------- Debridement Details Patient Name: Alexa Lowes P. Date of Service: 02/02/2018 11:15 AM Medical Record Number: [id number] Patient Account Number: [id number] Date of Birth/Sex: 1926-11-27 (84 y.o. Female) Treating RN: Cornell Barman Primary Care Provider: Lucianne Lei Other Clinician: Referring Provider: Lucianne Lei Treating Provider/Extender: Tito Dine in Treatment: 35 Debridement Performed for Wound #1 Left,Anterior Lower Leg Assessment: Performed By: Physician [name],  MD Debridement: Debridement Severity of Tissue Pre Fat layer exposed Debridement: Pre-procedure Verification/Time Yes - 11:50 Out Taken: Start Time: 11:50 Pain Control: Other : lidocaine 4% Level: Skin/Subcutaneous Tissue Total Area Debrided (L x W): 6 (cm) x 1.9 (cm) = 11.4 (cm) Tissue and other material Viable, Non-Viable, Other, Subcutaneous debrided: Instrument: Blade, Forceps Bleeding: Moderate Hemostasis Achieved: Silver Nitrate End Time: 11:53 Procedural Pain: 0 Post Procedural Pain: 0 Response to Treatment: Procedure was tolerated well Post Debridement Measurements of Total Wound Length: (cm) 6 Width: (cm) 1.9 Depth: (cm) 0.2 Volume: (cm) 1.791 Character of Wound/Ulcer Post Debridement: Stable Severity of Tissue Post Debridement: Fat layer exposed Post Procedure Diagnosis Same as Pre-procedure Electronic Signature(s) Signed: 02/02/2018 4:24:03 PM By: Gretta Cool, BSN, RN, CWS, Kim RN, BSN Signed: 02/02/2018 5:10:26 PM By: Linton Ham MD Entered By: Linton Ham on 02/02/2018 12:32:13 [name], [name] ([id number]) -------------------------------------------------------------------------------- HPI Details Patient Name: Alexa Lowes P. Date of Service: 02/02/2018 11:15 AM Medical Record Number: [id number] Patient Account Number: [id number] Date of Birth/Sex: [birth year] (84 y.o. Female) Treating RN: Cornell Barman Primary Care Provider: Lucianne Lei Other Clinician: Referring Provider: Lucianne Lei Treating Provider/Extender: Tito Dine in Treatment: 67 History of Present Illness HPI Description: 84 year old patient was seen in the ED about 10 days ago for a history of abrasion to the left lower extremity while she was boarding a bus and scraped the anterior part of her left leg. She has been a diabetic for many years and has been taking treatment regularly. Her past medical history is also significant for anemia arthritis constipation  and hypertension and is status post abdominal hysterectomy. She has never been a smoker. after her ER visit she was advised to apply Bactroban ointment to the wound twice a day and continue to monitor her blood glucose levels. her last hemoglobin A1c was done 3 years ago and was 6.6% 06/04/17 patient left anterior shin wound appears to be doing well although it is still somewhat dry despite  the treatment with Medihoney. We have been using Kerlex following the Medihoney application and I believe this is just not retaining that much moisture. Nonetheless there is no evidence of infection. 06/11/17 on evaluation today patient appears to be doing a little bit worse in regard to her wound. The entirety of the wound is dry and unfortunately the Medihoney does not seem to be helping this. That is even with the dressing changes that I made last week to try to retain more moisture. She has not tried [employer] as of yet. She was also noncompressible when testing for ABIs. 07/01/2017 -- she had a arterial studies done and was seen by Dr. Lucky Cowboy. her noninvasive studies showed noncompressible vessels on the right but brisk waveforms and digital pressures of 71 on the right consistent with only mild arterial insufficienc. On the left her ABI was 0.47 and this may be falsely elevated due to calcification. No digital pressures were obtained on the left and this is consistent with severe arterial insufficiency. this critical limb threatening situation on the left, would make wound healing difficult and it represents a serious situation and he has recommended an angiography with possible revascularization. The patient desired to defer the decision to she discuss with the family members. 07/09/17 on evaluation today patient appears to be doing fairly well in regard to her left anterior lower extremity wound. She has been tolerating the dressing changes without complication we have been utilizing Santyl at this time. She  tells me that she is having no significant pain compared to what she has had in the past. She has decided after discussing with family that she is going to go forward with the surgery with Dr. dew. This is to restore better blood flow to left lower extremity. With that it does appear that her lower extremity wound is making some progress albeit slow. No fevers, chills, nausea, or vomiting noted at this time. 07/16/17 on evaluation today patient's wound appears to be doing roughly about the same although some of the slough is clearing off. [name] she has her appointment with vascular next week on Thursday the 23rd. With that being said her wound does not appear to be hurting as badly as it has in the past which is good news. No fevers, chills, nausea, or vomiting noted at this time. 07/30/2017 -- she had surgery on 07/22/2017 --indications being nonhealing ulcer on the left leg with a noninvasive study showing marked reduction in ABI of less than 0.5 and no digital pressure on her left leg. she had a percutaneous transluminal angioplasty of the left common and external iliac arteries. She then had a stent placed to the left external iliac arteries and to the left common iliac artery. 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 The flow distally was poor and there were multiple areas of high-grade stenosis or occlusion of the distal SFA and the popliteal artery with only a diseased peroneal artery as runoff distally. She has a postop appointment on September 23 08/20/2017 -- she went back to the vascular office for an appointment on 08/12/2017 and bilateral ABIs were done which were notable for moderate right lower extremity arterial disease and unable to obtain left ankle brachial indices due to absent Doppler velocities in the posterior tibial artery and the anterior tibial artery. Monophasic waveforms to the left  femoral and popliteal artery. this was worrisome for a failure of stents placed  and lower extremity angiogram was recommended. as was [name], [name] ([id number]) done on 08/16/2017 and the stents placed in the left eye Lex system had some mild narrowing at the iliac bifurcation and the leading edge of the stent proximally was poor wall apposition with stenosis in the 50-60% range in the right common iliac artery. There was also occlusion of the common femoral artery and the origin of the SFA and profunda femoris artery. Flow distally was poor but they appeared to be multiple areas of high-grade stenosis or occlusion in the distal SFA and popliteal artery with only a diseased peroneal artery. in another stent was placed and deployed. The femoral occlusion would need to be treated surgically. 08/27/17 on evaluation today patient appears to be doing about the same in regard to her left for extremity wound. Unfortunately secondary to her vascular status this is not a very well healing wound at this time. She has been tolerating the dressing changes she has some discomfort but mainly with cleansing of the wound not at other times. No fevers, chills, nausea, or vomiting noted at this time. 09/17/2017 -- the patient was recently discharged from the hospital where she was admitted between 09/08/2017 and 09/10/2017, when she had a procedure on her left femoral artery with the endarterectomy and a patch. she will be going back for review this coming week. 09/24/2017 -- was seen on 09/18/2017 in the ER, for bleeding from her wound which was debrided earlier during the day. At the time she was seen there was no active bleeding. A thrombin pad was placed over the area and she was asked to change the outside dressing but come back to the wound center as planned. she was also seen at the [facility] office for a review after her surgery and bilateral lower extremity ABI when compared  to the previous ones showed minimal improvement of arterial blood flow. She was set up for a CT angiogram of the abdomen and pelvis and lower extremities to see the degree of peripheral arterial disease 10/28/2017 -- had a x-ray of the left tibia and fibula done on 10/07/2017 -- showed no bony acute abnormality she was reviewed by Dr. Corene Cornea dew on 10/26/2017, and he reviewed her CT angiogram and this demonstrated a widely patent left femoral endarterectomy including the origins of the femoral artery. She also had moderate distal disease in the popliteal artery, distal SFA and tibial disease. Her peroneal artery is a dominant runoff distally.he felt she had adequate perfusion for healing the mid calf level and he is putting off all surgical intervention for now and is going to see her back in 2-3 months for noninvasive studies. She also has a new wound on the plantar aspect of the left big toe where she pulled a piece of skin and has caused a superficial ulceration 11/19/17 on evaluation today patient appears to be doing decently well at this point in time. It does appear that her Wound VAC has been approved as ordered by Dr. Con Memos however she has not received that as of yet. Nonetheless the wound bed in general appears to have minimal slough noted at this time in a fairly good amount of granulation although there is to be a noted in the center of the wound. No fevers, chills, nausea, or vomiting noted at this time. 12/02/17 on evaluation today patient appears to be doing about the same in regard to her left anterior shin wound. Unfortunately her left toe wound appears  to still be doing about the same as far as the eschar covering. This has softened up a bit to the point that it is now loose and could have selective debridement which would allow for the Santyl to work better. Fortunately she is having no pain. 12/10/17 on evaluation today patient appears to be doing fairly well in regard to her left  anterior shin wound and that it at least appears to be stable. With that being said she does have really no significant healing at this point this wound seems to be mainly just maintaining. Subsequently patient also has the left toe wound which does not seem to be progressing. There is still nonviable tissue overlying the surface of the wound and this doesn't even seem to be loosening up very well at all. And patient has been using the Santyl. I do think this may need selective debridement today. Fortunately there does not appear to be any evidence of infection although I am concerned about the possibility that patient's blood flow is not likely sufficient to be able to tolerate appropriate wound healing. This was discussed with patient today as well although obviously I would leave the determination up to vascular surgery. She will be seeing Dr. dew on February 5 for repeat vascular studies and then in office evaluation. 12/17/17 on evaluation today patient appears to be doing about the same in regard to her left shin and left great toe ulcers. She has been performing the dressing changes as previously recommended over the past week. With that being said unfortunately nothing seems to be changing much in regard to the dimensions of the wound and I do feel again that this may be related to vascular flow. Patient's daughter was present during the evaluation today and we did discuss this as well. I will be detailed in greater detail in the plan. [name], [name] ([id number]) 12/23/17 she is here in follow up evaluation of a left great toe and left anterior leg ulcer. There is noted ischemic changes (urple, dry gangrene, shriveling effect laterally) to the left great toe, although not overtly cold. Contact was made to Dr Ozella Almond office regarding possibility of seeing her in the office today or tomorrow as this is an acute change; they recommended she go to the emergency department for evaluation by Dr  Ozella Almond partner Dr Curley Spice. The ER was contacted, spoke with Tiffany, regarding this. Patient was accompanied by her son. The left anterior shin ulcer is improved in measurement, overtly exposed bone with area of discoloration/necrosis. will follow up after hospitalization/vascular intervention 12/31/17 on evaluation today patient is status post having gone to the ER last week on 12/23/17. 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 This was performed by Dr. Cleda Mccreedy on 12/23/17. Subsequently Dr. Corene Cornea do with [facility] specialist performed percutaneous evaluation and intervention of patient's left lower extremity on 12/27/17. Angioplasty was performed following angiogram of the left peroneal artery and tibial angioplasty was also performed of the left popliteal artery. Angioplasty of the mid and distal superficial femoral arteries was likewise undertaken. Stents were placed in the left above knee popliteal artery following angioplasty and then a second stent placed in the mid- SFA secondary to 50% residual stenosis following the angioplasty. Fortunately patient was much better post procedure as far as stenosis/occlusions were concerned and she did have definitely much better blood flow which was noted to be evident on evaluation today. 01/07/18 on evaluation today patient appears to be  doing well on her left lower extremity in regard to the anterior shin ulcer. She has been tolerating the dressing changes without complication. The good news is nothing appears to be doing worse and in general she does seem to have more granulation tissue. There is still bone exposure. Fortunately she's been tolerating the dressing changes. Patient's a the eyes that Dr. Bunnie Domino office on 01/04/18 revealed a right ABI of 0.69 and a left ABI of 0.81 obviously this is doing very well and significantly improved. 01/12/18; patient with a traumatic wound on the left anterior tibial area. Fairly  substantial wound was still open tibia however the amount of exposed bone is apparently a lot better. We have applied for TheraSkins but have not yet received approval. She has been using silver collagen as mentioned everything appears better. She had recent revascularization by [facility] and had a left ABI of 0.81. 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 This is still wrapped and I did not look at this today, apparently we are not following secondary to this being a recent surgical wound. 01/19/18; patient with a traumatic wound on the left anterior tibial area. Only a small amount of open tibia still remains. We applied Theraskin #1. She has an another wound at the right great toe amputation site. This is being followed by her surgeon on a 59 day global. Our nurse inadvertently remove the wet to dry dressing and reported there is exposed bone. We are not following this as of yet 02/02/18; she is back for a deep traumatic wound over the left anterior tibia. We applied the first TheraSkin 2 weeks ago. She returns today in follow-up. The wound is better there is no exposed bone. She has home health going out they will change the superficial dressings on this in a week there also dealing with a right great toe amputation site, we are not following this as of yet Electronic Signature(s) Signed: 02/02/2018 5:10:26 PM By: Linton Ham MD Entered By: Linton Ham on 02/02/2018 12:34:38 [name], [name] ([id number]) -------------------------------------------------------------------------------- Physical Exam Details Patient Name: Alexa Lowes P. Date of Service: 02/02/2018 11:15 AM Medical Record Number: [id number] Patient Account Number: [id number] Date of Birth/Sex: [birth year] (84 y.o. Female) Treating RN: Cornell Barman Primary Care Provider: Lucianne Lei Other Clinician: Referring Provider: Lucianne Lei Treating Provider/Extender: [name] Weeks in Treatment: 35 Notes wound exam; the wound is on the left anterior tibia. The wound is smaller and now with no exposed bone. There is still a divot medially which was actually where the exposed bone was a week ago. When she came in she had adherent TheraSkin and necrotic debris which I removed with pickups up pickups and a #15 scalpel blade. Hemostasis with silver nitrate Electronic Signature(s) Signed: 02/02/2018 5:10:26 PM By: Linton Ham MD Entered By: Linton Ham on 02/02/2018 12:36:49 [name], [name] ([id number]) -------------------------------------------------------------------------------- Physician Orders Details Patient Name: Alexa Lowes P. Date of Service: 02/02/2018 11:15 AM Medical Record Number: [id number] Patient Account Number: [id number] Date of Birth/Sex: 02-22-1926 (84 y.o. Female) Treating RN: Roger Shelter Primary Care Provider: Lucianne Lei Other Clinician: Referring Provider: Lucianne Lei Treating Provider/Extender: Tito Dine in Treatment: 41 Verbal / Phone Orders: No Diagnosis Coding Wound Cleansing Wound #1 Left,Anterior Lower Leg o Clean wound with Normal Saline. o Cleanse wound with mild soap and water Anesthetic (add to Medication List) Wound #1 Left,Anterior Lower Leg o Topical Lidocaine 4% cream applied to wound bed prior  to debridement (In Clinic Only). Skin Barriers/Peri-Wound Care Wound #1 Left,Anterior Lower Leg o Skin Prep Primary Wound Dressing Wound #1 Left,Anterior Lower Leg o Dry Gauze Secondary Dressing o ABD and Kerlix/Conform Wound #1 Left,Anterior Lower Leg o Other - x-sorb Dressing Change Frequency Wound #1 Left,Anterior Lower Leg o Change dressing every week - Theraskin applied: do not remove anything beyond the steri-strips. Follow-up Appointments Wound #1 Left,Anterior Lower Leg o Return Appointment in 2 weeks. Home Health Wound #1 [place] Visits - [place] Nurse may visit PRN to address patientos wound care needs. o FACE TO FACE ENCOUNTER: MEDICARE and MEDICAID PATIENTS: I certify that this patient is under my care and that I had a face-to-face encounter that meets the physician face-to-face encounter requirements with this patient on this date. The encounter with the patient was in whole or in part for the following MEDICAL CONDITION: (primary reason for [place]) MEDICAL NECESSITY: I certify, that based on my findings, NURSING services are a medically necessary home health service. HOME BOUND STATUS: I certify that my clinical findings support that this patient is homebound (i.e., Due to illness or injury, pt requires aid of supportive devices such as crutches, cane, wheelchairs, walkers, the use of special transportation or the assistance of another person to leave their place of residence. There is a normal inability to leave the home Hinderer, Solomon. ([id number]) and doing so requires considerable and taxing effort. Other absences are for medical reasons / religious services and are infrequent or of short duration when for other reasons). o If current dressing causes regression in wound condition, may D/C ordered dressing product/s and apply Normal Saline Moist Dressing daily until next [place] / Other MD appointment. [place] of regression in wound condition at [phone]. o Please direct any NON-WOUND related issues/requests for orders to patient's Primary Care Physician Advanced Therapies Wound #1 Left,Anterior Lower Leg o Theraskin application in clinic; including contact layer, fixation with steri strips, dry gauze and cover dressing. - Theraskin must stay in place with mepitel for 2 weeks with steri strips intact. HHRN - DO NOT REMOVE THIS DRESSING Electronic Signature(s) Signed: 02/02/2018 4:15:18 PM By: Roger Shelter Signed: 02/02/2018  5:10:26 PM By: Linton Ham MD Entered By: Roger Shelter on 02/02/2018 12:03:40 Safer, [name] ([id number]) -------------------------------------------------------------------------------- Problem List Details Patient Name: Alexa Lowes P. Date of Service: 02/02/2018 11:15 AM Medical Record Number: [id number] Patient Account Number: [id number] Date of Birth/Sex: 12/22/1925 (84 y.o. 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 ([id number]) -------------------------------------------------------------------------------- Progress Note Details Patient Name: Alexa Lowes P. Date of Service: 02/02/2018 11:15 AM Medical Record Number: [id number] Patient Account Number: [id number] Date of Birth/Sex: 05/25/1926 (84 y.o. Female) Treating RN: Gretta Cool,  [place] Primary Care Provider: Lucianne Lei Other Clinician: Referring Provider: Lucianne Lei Treating Provider/Extender: Tito Dine in Treatment: 35 Subjective History of Present Illness (HPI) 84 year old patient was seen in the ED about 10 days ago for a history of  abrasion to the left lower extremity while she was boarding a bus and scraped the anterior part of her left leg. She has been a diabetic for many years and has been taking treatment regularly. Her past medical history is also significant for anemia arthritis constipation and hypertension and is status post abdominal hysterectomy. She has never been a smoker. after her ER visit she was advised to apply Bactroban ointment to the wound twice a day and continue to monitor her blood glucose levels. her last hemoglobin A1c was done 3 years ago and was 6.6% 06/04/17 patient left anterior shin wound appears to be doing well although it is still somewhat dry despite the treatment with Medihoney. We have been using Kerlex following the Medihoney application and I believe this is just not retaining that much moisture. Nonetheless there is no evidence of infection. 06/11/17 on evaluation today patient appears to be doing a little bit worse in regard to her wound. The entirety of the wound is dry and unfortunately the Medihoney does not seem to be helping this. That is even with the dressing changes that I made last week to try to retain more moisture. She has not tried [employer] as of yet. She was also noncompressible when testing for ABIs. 07/01/2017 -- she had a arterial studies done and was seen by Dr. Lucky Cowboy. her noninvasive studies showed noncompressible vessels on the right but brisk waveforms and digital pressures of 71 on the right consistent with only mild arterial insufficienc. On the left her ABI was 0.47 and this may be falsely elevated due to calcification. No digital pressures were obtained on the left and this is consistent with severe arterial insufficiency. this critical limb threatening situation on the left, would make wound healing difficult and it represents a serious situation and he has recommended an angiography with possible revascularization. The patient desired to defer the decision to she  discuss with the family members. 07/09/17 on evaluation today patient appears to be doing fairly well in regard to her left anterior lower extremity wound. She has been tolerating the dressing changes without complication we have been utilizing Santyl at this time. She tells me that she is having no significant pain compared to what she has had in the past. She has decided after discussing with family that she is going to go forward with the surgery with Dr. dew. This is to restore better blood flow to left lower extremity. With that it does appear that her lower extremity wound is making some progress albeit slow. No fevers, chills, nausea, or vomiting noted at this time. 07/16/17 on evaluation today patient's wound appears to be doing roughly about the same although some of the slough is clearing off. [name] she has her appointment with vascular next week on Thursday the 23rd. With that being said her wound does not appear to be hurting as badly as it has in the past which is good news. No fevers, chills, nausea, or vomiting noted at this time. 07/30/2017 -- she had surgery on 07/22/2017 --indications being nonhealing ulcer on the left leg with a noninvasive study showing marked reduction in ABI of less than 0.5 and no digital pressure on her left leg. she had a percutaneous transluminal angioplasty of the  left common and external iliac arteries. She then had a stent placed to the left external iliac arteries and to the left common iliac artery. 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 The flow distally was poor and there were multiple areas of high-grade stenosis or occlusion of the distal SFA and the popliteal artery with only a diseased peroneal artery as runoff distally. She has a postop appointment on September 23 08/20/2017 -- she went back to the vascular office for an appointment on 08/12/2017 and  bilateral ABIs were done which were notable for moderate right lower extremity arterial disease and unable to obtain left ankle brachial indices due to absent Doppler velocities in the posterior tibial artery and the anterior tibial artery. Monophasic waveforms to the left femoral and Norberto, Citlali P. ([id number]) popliteal artery. this was worrisome for a failure of stents placed and lower extremity angiogram was recommended. as was done on 08/16/2017 and the stents placed in the left eye Lex system had some mild narrowing at the iliac bifurcation and the leading edge of the stent proximally was poor wall apposition with stenosis in the 50-60% range in the right common iliac artery. There was also occlusion of the common femoral artery and the origin of the SFA and profunda femoris artery. Flow distally was poor but they appeared to be multiple areas of high-grade stenosis or occlusion in the distal SFA and popliteal artery with only a diseased peroneal artery. in another stent was placed and deployed. The femoral occlusion would need to be treated surgically. 08/27/17 on evaluation today patient appears to be doing about the same in regard to her left for extremity wound. Unfortunately secondary to her vascular status this is not a very well healing wound at this time. She has been tolerating the dressing changes she has some discomfort but mainly with cleansing of the wound not at other times. No fevers, chills, nausea, or vomiting noted at this time. 09/17/2017 -- the patient was recently discharged from the hospital where she was admitted between 09/08/2017 and 09/10/2017, when she had a procedure on her left femoral artery with the endarterectomy and a patch. she will be going back for review this coming week. 09/24/2017 -- was seen on 09/18/2017 in the ER, for bleeding from her wound which was debrided earlier during the day. At the time she was seen there was no active bleeding. A  thrombin pad was placed over the area and she was asked to change the outside dressing but come back to the wound center as planned. she was also seen at the [facility] for a review after her surgery and bilateral lower extremity ABI when compared to the previous ones showed minimal improvement of arterial blood flow. She was set up for a CT angiogram of the abdomen and pelvis and lower extremities to see the degree of peripheral arterial disease 10/28/2017 -- had a x-ray of the left tibia and fibula done on 10/07/2017 -- showed no bony acute abnormality she was reviewed by Dr. Corene Cornea dew on 10/26/2017, and he reviewed her CT angiogram and this demonstrated a widely patent left femoral endarterectomy including the origins of the femoral artery. She also had moderate distal disease in the popliteal artery, distal SFA and tibial disease. Her peroneal artery is a dominant runoff distally.he felt she had adequate perfusion for healing the mid calf level and he is putting  off all surgical intervention for now and is going to see her back in 2-3 months for noninvasive studies. She also has a new wound on the plantar aspect of the left big toe where she pulled a piece of skin and has caused a superficial ulceration 11/19/17 on evaluation today patient appears to be doing decently well at this point in time. It does appear that her Wound VAC has been approved as ordered by Dr. Con Memos however she has not received that as of yet. Nonetheless the wound bed in general appears to have minimal slough noted at this time in a fairly good amount of granulation although there is to be a noted in the center of the wound. No fevers, chills, nausea, or vomiting noted at this time. 12/02/17 on evaluation today patient appears to be doing about the same in regard to her left anterior shin wound. Unfortunately her left toe wound appears to still be doing about the same as far as the eschar  covering. This has softened up a bit to the point that it is now loose and could have selective debridement which would allow for the Santyl to work better. Fortunately she is having no pain. 12/10/17 on evaluation today patient appears to be doing fairly well in regard to her left anterior shin wound and that it at least appears to be stable. With that being said she does have really no significant healing at this point this wound seems to be mainly just maintaining. Subsequently patient also has the left toe wound which does not seem to be progressing. There is still nonviable tissue overlying the surface of the wound and this doesn't even seem to be loosening up very well at all. And patient has been using the Santyl. I do think this may need selective debridement today. Fortunately there does not appear to be any evidence of infection although I am concerned about the possibility that patient's blood flow is not likely sufficient to be able to tolerate appropriate wound healing. This was discussed with patient today as well although obviously I would leave the determination up to vascular surgery. She will be seeing Dr. dew on February 5 for repeat vascular studies and then in office evaluation. 12/17/17 on evaluation today patient appears to be doing about the same in regard to her left shin and left great toe ulcers. She has been performing the dressing changes as previously recommended over the past week. With that being said unfortunately nothing seems to be changing much in regard to the dimensions of the wound and I do feel again that this may be related to vascular flow. Patient's daughter was present during the evaluation today and we did discuss this as well. I will be detailed in greater detail in the plan. AALYSSA, ELDERKIN ([id number]) 12/23/17 she is here in follow up evaluation of a left great toe and left anterior leg ulcer. There is noted ischemic changes (urple, dry gangrene,  shriveling effect laterally) to the left great toe, although not overtly cold. Contact was made to Dr Ozella Almond office regarding possibility of seeing her in the office today or tomorrow as this is an acute change; they recommended she go to the emergency department for evaluation by Dr Ozella Almond partner Dr Curley Spice. The ER was contacted, spoke with Tiffany, regarding this. Patient was accompanied by her son. The left anterior shin ulcer is improved in measurement, overtly exposed bone with area of discoloration/necrosis. will follow up after hospitalization/vascular intervention 12/31/17 on evaluation today  patient is status post having gone to the ER last week on 12/23/17. 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 This was performed by Dr. Cleda Mccreedy on 12/23/17. Subsequently Dr. Corene Cornea do with [facility] performed percutaneous evaluation and intervention of patient's left lower extremity on 12/27/17. Angioplasty was performed following angiogram of the left peroneal artery and tibial angioplasty was also performed of the left popliteal artery. Angioplasty of the mid and distal superficial femoral arteries was likewise undertaken. Stents were placed in the left above knee popliteal artery following angioplasty and then a second stent placed in the mid- SFA secondary to 50% residual stenosis following the angioplasty. Fortunately patient was much better post procedure as far as stenosis/occlusions were concerned and she did have definitely much better blood flow which was noted to be evident on evaluation today. 01/07/18 on evaluation today patient appears to be doing well on her left lower extremity in regard to the anterior shin ulcer. She has been tolerating the dressing changes without complication. The good news is nothing appears to be doing worse and in general she does seem to have more granulation tissue. There is still bone exposure. Fortunately she's been  tolerating the dressing changes. Patient's a the eyes that Dr. Bunnie Domino office on 01/04/18 revealed a right ABI of 0.69 and a left ABI of 0.81 obviously this is doing very well and significantly improved. 01/12/18; patient with a traumatic wound on the left anterior tibial area. Fairly substantial wound was still open tibia however the amount of exposed bone is apparently a lot better. We have applied for TheraSkins but have not yet received approval. She has been using silver collagen as mentioned everything appears better. She had recent revascularization by  vein and vascular and had a left ABI of 0.81. 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 This is still wrapped and I did not look at this today, apparently we are not following secondary to this being a recent surgical wound. 01/19/18; patient with a traumatic wound on the left anterior tibial area. Only a small amount of open tibia still remains. We applied Theraskin #1. She has an another wound at the right great toe amputation site. This is being followed by her surgeon on a 55 day global. Our nurse inadvertently remove the wet to dry dressing and reported there is exposed bone. We are not following this as of yet 02/02/18; she is back for a deep traumatic wound over the left anterior tibia. We applied the first TheraSkin 2 weeks ago. She returns today in follow-up. The wound is better there is no exposed bone. She has home health going out they will change the superficial dressings on this in a week there also dealing with a right great toe amputation site, we are not following this as of yet Objective Constitutional Vitals Time Taken: 11:22 AM, Height: 65 in, Weight: 131 lbs, BMI: 21.8, Temperature: 97.7 F, Pulse: 84 bpm, Respiratory Rate: 16 breaths/min, Blood Pressure: 148/64 mmHg. Integumentary (Hair, Skin) Wound #1 status is Open. Original cause of wound was Trauma. The wound is located on the Left,Anterior  Lower Leg. The wound measures 6cm length x 1.9cm width x 0.2cm depth; 8.954cm^2 area and 1.791cm^3 volume. There is bone, muscle, Fat Layer (Subcutaneous Tissue) Exposed, and fascia exposed. There is no tunneling or undermining noted. There is a large amount of serosanguineous drainage noted. The wound margin is flat and intact. There is  small (1-33%) red granulation [name], [name] ([id number]) within the wound bed. There is a large (67-100%) amount of necrotic tissue within the wound bed including Eschar and Adherent Slough. The periwound skin appearance exhibited: Scarring, Maceration, Hemosiderin Staining. The periwound skin appearance did not exhibit: Callus, Crepitus, Excoriation, Induration, Rash, Dry/Scaly, Atrophie Blanche, Cyanosis, Ecchymosis, Mottled, Pallor, Rubor, Erythema. Periwound temperature was noted as No Abnormality. The periwound has tenderness on palpation. 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 There was a Skin/Subcutaneous Tissue Debridement (11042- 11047) debridement with total area of 11.4 sq cm performed by [name], MD. with the following instrument(s): Blade and Forceps to remove Viable and Non-Viable tissue/material including Other and Subcutaneous after achieving pain control using Other (lidocaine 4%). A time out was conducted at 11:50, prior to the  start of the procedure. A Moderate amount of bleeding was controlled with Silver Nitrate. The procedure was tolerated well with a pain level of 0 throughout and a pain level of 0 following the procedure. Post Debridement Measurements: 6cm length x 1.9cm width x 0.2cm depth; 1.791cm^3 volume. Character of Wound/Ulcer Post Debridement is stable. Severity of Tissue Post Debridement is: Fat layer exposed. Post procedure Diagnosis Wound #1: Same as Pre-Procedure Pre-procedure diagnosis of Wound #1 is a Diabetic Wound/Ulcer of the Lower Extremity located on the Left,Anterior Lower Leg. A skin graft procedure using a bioengineered skin substitute/cellular or tissue based product was performed by [name], MD with the following instrument(s): Forceps. Theraskin was applied and secured with Steri-Strips. 13 sq cm of product was utilized and 0 sq cm was wasted. Post Application, mepitel one was applied. A Time Out was conducted at 11:57, prior to the start of the procedure. The procedure was tolerated well with a pain level of 0 throughout and a pain level of 0 following the procedure. Post procedure Diagnosis Wound #1: Same as Pre-Procedure . Plan Wound Cleansing: Wound #1 Left,Anterior Lower Leg: Clean wound with Normal Saline. [name], [name] ([id number]) Cleanse wound with mild soap and water Anesthetic (add to Medication List): Wound #1 Left,Anterior Lower Leg: Topical Lidocaine 4% cream applied to wound bed prior to debridement (In Clinic Only). Skin Barriers/Peri-Wound Care: Wound #1 Left,Anterior Lower Leg: Skin Prep Primary Wound Dressing: Wound #1 Left,Anterior Lower Leg: Dry Gauze Secondary Dressing: ABD and Kerlix/Conform Wound #1 Left,Anterior Lower Leg: Other - x-sorb Dressing Change Frequency: Wound #1 Left,Anterior Lower Leg: Change dressing every week - Theraskin applied: do not remove anything beyond the steri-strips. Follow-up Appointments: Wound #1  Left,Anterior Lower Leg: Return Appointment in 2 weeks. Home Health: Wound #1 Left,Anterior Lower Leg: [place] Visits - [place] Nurse may visit PRN to address patient s wound care needs. FACE TO FACE ENCOUNTER: MEDICARE and MEDICAID PATIENTS: I certify that this patient is under my care and that I had a face-to-face encounter that meets the physician face-to-face encounter requirements with this patient on this  date. The encounter with the patient was in whole or in part for the following MEDICAL CONDITION: (primary reason for [place]) MEDICAL NECESSITY: I certify, that based on my findings, NURSING services are a medically necessary home health service. HOME BOUND STATUS: I certify that my clinical findings support that this patient is homebound (i.e., Due to illness or injury, pt requires aid of supportive devices such as crutches, cane, wheelchairs, walkers, the use of special transportation or the assistance of another person to leave their place of residence. There is a normal inability to leave the home and doing so requires considerable and taxing effort. Other absences are for medical reasons / religious services and are infrequent or of short duration when for other reasons). If current dressing causes regression in wound condition, may D/C ordered dressing product/s and apply Normal Saline Moist Dressing daily until next Edith Endave / Other MD appointment. [place] of regression in wound condition at [phone]. Please direct any NON-WOUND related issues/requests for orders to patient's Primary Care Physician Advanced Therapies: Wound #1 Left,Anterior Lower Leg: Theraskin application in clinic; including contact layer, fixation with steri strips, dry gauze and cover dressing. - Theraskin must stay in place with mepitel for 2 weeks with steri strips intact. HHRN - DO NOT REMOVE THIS DRESSING #1 TheraSkin #2 applied in the  standard fashion. Electronic Signature(s) Signed: 02/02/2018 5:10:26 PM By: Linton Ham MD Entered By: Linton Ham on 02/02/2018 12:37:55 [name], [name] ([id number]) -------------------------------------------------------------------------------- SuperBill Details Patient Name: Alexa Lowes P. Date of Service: 02/02/2018 Medical Record Number: [id number] Patient Account Number: [id number] Date of Birth/Sex: 07-22-1926 (84 y.o. 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 I70.245 Atherosclerosis of native arteries of left leg with ulceration of other part of foot Facility Procedures CPT4 Code Description: [id number] - SKIN SUB GRAFT TRNK/ARM/LEG ICD-10 Diagnosis Description L97.824 Non-pressure chronic ulcer of other part of left lower leg with necros Modifier: is of bone Quantity: 1 CPT4 Code Description: [id number]- Theraskin per 1sq cm small -13 sq cm Modifier: Quantity: 13 Physician Procedures CPT4 Code Description: [id number] - WC PHYS SKIN SUB GRAFT TRNK/ARM/LEG ICD-10 Diagnosis Description L97.824 Non-pressure chronic ulcer of other part of left lower leg with necrosi Modifier: s of bone Quantity: 1 Electronic Signature(s) Signed: 02/02/2018 5:10:26 PM By: Linton Ham MD Entered By: Linton Ham on 02/02/2018 12:38:13

## 2018-02-04 NOTE — Progress Notes (Signed)
RAE, PLOTNER ([id number]) Visit Report for 02/02/2018 Arrival Information Details Patient Name: [name], [name] Date of Service: 02/02/2018 11:15 AM Medical Record Number: [id number] Patient Account Number: [id number] Date of Birth/Sex: 1926/08/30 (84 y.o. Female) Treating RN: Carolyne Fiscal, Debi Primary Care Demetreus Lothamer: Lucianne Lei Other Clinician: Referring Zakye Baby: Lucianne Lei Treating Klyn Kroening/Extender: Tito Dine in Treatment: 45 Visit Information History Since Last Visit All ordered tests and consults were completed: No Patient Arrived: Cane Added or deleted any medications: No Arrival Time: 11:20 Any new allergies or adverse reactions: No Accompanied By: self Had a fall or experienced change in No Transfer Assistance: None activities of daily living that may affect Patient Identification Verified: Yes risk of falls: Secondary Verification Process Yes Signs or symptoms of abuse/neglect since last visito No Completed: Hospitalized since last visit: No Patient Requires Transmission-Based No Has Dressing in Place as Prescribed: Yes Precautions: Pain Present Now: No Patient Has Alerts: Yes Patient Alerts: 01/04/2018 ABI @ [facility] (R) 0.69 (L) 0.81 Electronic Signature(s) Signed: 02/02/2018 4:15:20 PM By: Alric Quan Entered By: Alric Quan on 02/02/2018 11:22:03 [name], [name] ([id number]) -------------------------------------------------------------------------------- Encounter Discharge Information Details Patient Name: Alexa Lowes P. Date of Service: 02/02/2018 11:15 AM Medical Record Number: [id number] Patient Account Number: [id number] Date of Birth/Sex: 11-02-1926 (84 y.o. Female) Treating RN: Montey Hora Primary Care Zoei Amison: Lucianne Lei Other Clinician: Referring Emeri Estill: Lucianne Lei Treating Carmyn Hamm/Extender: Tito Dine in Treatment: 16 Encounter Discharge Information Items Discharge Pain Level: 0 Discharge  Condition: Stable Ambulatory Status: Cane Discharge Destination: Home Transportation: Private Auto Accompanied By: self Schedule Follow-up Appointment: Yes Medication Reconciliation completed and No provided to Patient/Care Earnestine Tuohey: Provided on Clinical Summary of Care: 02/02/2018 Form Type Recipient Paper Patient GW Electronic Signature(s) Signed: 02/03/2018 11:42:25 AM By: Ruthine Dose Previous Signature: 02/02/2018 12:42:23 PM Version By: Montey Hora Entered By: Ruthine Dose on 02/02/2018 12:45:47 [name], [name] ([id number]) -------------------------------------------------------------------------------- Lower Extremity Assessment Details Patient Name: Alexa Lowes P. Date of Service: 02/02/2018 11:15 AM Medical Record Number: [id number] Patient Account Number: [id number] Date of Birth/Sex: [birth year] (84 y.o. Female) Treating RN: Carolyne Fiscal, Debi Primary Care Nayah Lukens: Lucianne Lei Other Clinician: Referring Kaiyla Stahly: Lucianne Lei Treating Joann Kulpa/Extender: Tito Dine in Treatment: 35 Vascular Assessment Pulses: Posterior Tibial Extremity colors, hair growth, and conditions: Extremity Color: [Left:Normal] Temperature of Extremity: [Left:Warm] Notes unable to all the assessment d/t surgical wound dressing Electronic Signature(s) Signed: 02/02/2018 4:15:20 PM By: Alric Quan Entered By: Alric Quan on 02/02/2018 11:29:54 Covington, Fiona P. ([id number]) -------------------------------------------------------------------------------- Multi Wound Chart Details Patient Name: Alexa Lowes P. Date of Service: 02/02/2018 11:15 AM Medical Record Number: [id number] Patient Account Number: [id number] Date of Birth/Sex: 1926-11-29 (84 y.o. Female) Treating RN: Roger Shelter Primary Care Sultana Tierney: Lucianne Lei Other Clinician: Referring Hilary Pundt: Lucianne Lei Treating Belle Charlie/Extender: Tito Dine in Treatment: 35 Vital  Signs Height(in): 65 Pulse(bpm): 51 Weight(lbs): 131 Blood Pressure(mmHg): 148/64 Body Mass Index(BMI): 22 Temperature(F): 97.7 Respiratory Rate 16 (breaths/min): Photos: [1:No Photos] [N/A:N/A] Wound Location: [1:Left Lower Leg - Anterior] [N/A:N/A] Wounding Event: [1:Trauma] [N/A:N/A] Primary Etiology: [1:Diabetic Wound/Ulcer of the Lower Extremity] [N/A:N/A] Secondary Etiology: [1:Trauma, Other] [N/A:N/A] Comorbid History: [1:Anemia, Hypertension, Type II Diabetes, Osteoarthritis] [N/A:N/A] Date Acquired: [1:05/02/2017] [N/A:N/A] Weeks of Treatment: [1:35] [N/A:N/A] Wound Status: [1:Open] [N/A:N/A] Measurements L x W x D [1:6x1.9x0.2] [N/A:N/A] (cm) Area (cm) : [1:8.954] [N/A:N/A] Volume (cm) : [1:1.791] [N/A:N/A] % Reduction in Area: [1:-86.30%] [N/A:N/A] % Reduction in Volume: [1:-86.40%] [N/A:N/A] Classification: [1:Grade 2] [N/A:N/A] Exudate Amount: [1:Large] [N/A:N/A] Exudate Type: [1:Serosanguineous] [N/A:N/A]  Exudate Color: [1:red, brown] [N/A:N/A] Wound Margin: [1:Flat and Intact] [N/A:N/A] Granulation Amount: [1:Small (1-33%)] [N/A:N/A] Granulation Quality: [1:Red] [N/A:N/A] Necrotic Amount: [1:Large (67-100%)] [N/A:N/A] Necrotic Tissue: [1:Eschar, Adherent Slough] [N/A:N/A] Exposed Structures: [1:Fascia: Yes Fat Layer (Subcutaneous Tissue) Exposed: Yes Muscle: Yes Bone: Yes Tendon: No Joint: No] [N/A:N/A] Epithelialization: [1:Small (1-33%)] [N/A:N/A] Debridement: [1:Debridement ([id number])] [N/A:N/A] Pre-procedure [1:11:50] [N/A:N/A] Verification/Time Out Taken: [name], [name] ([id number]) Pain Control: Other N/A N/A Tissue Debrided: Other, Subcutaneous N/A N/A Level: Skin/Subcutaneous Tissue N/A N/A Debridement Area (sq cm): 11.4 N/A N/A Instrument: Blade, Forceps N/A N/A Bleeding: Moderate N/A N/A Hemostasis Achieved: Silver Nitrate N/A N/A Procedural Pain: 0 N/A N/A Post Procedural Pain: 0 N/A N/A Debridement Treatment Procedure was  tolerated well N/A N/A Response: Post Debridement 6x1.9x0.2 N/A N/A Measurements L x W x D (cm) Post Debridement Volume: 1.791 N/A N/A (cm) Periwound Skin Texture: Scarring: Yes N/A N/A Excoriation: No Induration: No Callus: No Crepitus: No Rash: No Periwound Skin Moisture: Maceration: Yes N/A N/A Dry/Scaly: No Periwound Skin Color: Hemosiderin Staining: Yes N/A N/A Atrophie Blanche: No Cyanosis: No Ecchymosis: No Erythema: No Mottled: No Pallor: No Rubor: No Temperature: No Abnormality N/A N/A Tenderness on Palpation: Yes N/A N/A Wound Preparation: Ulcer Cleansing: N/A N/A Rinsed/Irrigated with Saline Topical Anesthetic Applied: Other: lidocaine 4% Procedures Performed: Cellular or Tissue Based N/A N/A Product Debridement Treatment Notes Electronic Signature(s) Signed: 02/02/2018 5:10:26 PM By: Linton Ham MD Entered By: Linton Ham on 02/02/2018 12:31:59 [name], [name] ([id number]) -------------------------------------------------------------------------------- Multi-Disciplinary Care Plan Details Patient Name: Alexa Lowes P. Date of Service: 02/02/2018 11:15 AM Medical Record Number: [id number] Patient Account Number: [id number] Date of Birth/Sex: February 14, 1926 (84 y.o. Female) Treating RN: Roger Shelter Primary Care Pasco Marchitto: Lucianne Lei Other Clinician: Referring Raheel Kunkle: Lucianne Lei Treating Gustavus Haskin/Extender: Tito Dine in Treatment: 35 Active Inactive ` Abuse / Safety / Falls / Self Care Management Nursing Diagnoses: Potential for falls Goals: Patient will remain injury free related to falls Date Initiated: 05/28/2017 Target Resolution Date: 07/23/2017 Goal Status: Active Interventions: Assess fall risk on admission and as needed Notes: ` Orientation to the Wound Care Program Nursing Diagnoses: Knowledge deficit related to the wound healing center program Goals: Patient/caregiver will verbalize understanding of  the [place] Program Date Initiated: 05/28/2017 Target Resolution Date: 07/23/2017 Goal Status: Active Interventions: Provide education on orientation to the wound center Notes: ` Wound/Skin Impairment Nursing Diagnoses: Impaired tissue integrity Goals: Ulcer/skin breakdown will have a volume reduction of 30% by week 4 Date Initiated: 05/28/2017 Target Resolution Date: 07/23/2017 Goal Status: Active Ulcer/skin breakdown will have a volume reduction of 50% by week 8 Date Initiated: 05/28/2017 Target Resolution Date: 07/23/2017 BRIANI, MAUL ([id number]) Goal Status: Active Ulcer/skin breakdown will have a volume reduction of 80% by week 12 Date Initiated: 05/28/2017 Target Resolution Date: 07/23/2017 Goal Status: Active Ulcer/skin breakdown will heal within 14 weeks Date Initiated: 05/28/2017 Target Resolution Date: 07/23/2017 Goal Status: Active Interventions: Assess patient/caregiver ability to obtain necessary supplies Assess patient/caregiver ability to perform ulcer/skin care regimen upon admission and as needed Assess ulceration(s) every visit Notes: Electronic Signature(s) Signed: 02/02/2018 4:15:18 PM By: Roger Shelter Entered By: Roger Shelter on 02/02/2018 11:49:31 [name], [name] ([id number]) -------------------------------------------------------------------------------- Pain Assessment Details Patient Name: Alexa Lowes P. Date of Service: 02/02/2018 11:15 AM Medical Record Number: [id number] Patient Account Number: [id number] Date of Birth/Sex: October 18, 1926 (84 y.o. Female) Treating RN: Carolyne Fiscal, Debi Primary Care Kristle Wesch: Lucianne Lei Other Clinician: Referring Hollee Fate: Lucianne Lei Treating Kiyomi Pallo/Extender: [name] Weeks in Treatment:  35 Active Problems Location of Pain Severity and Description of Pain Patient Has Paino No Site Locations Pain Management and Medication Current Pain Management: Electronic  Signature(s) Signed: 02/02/2018 4:15:20 PM By: Alric Quan Entered By: Alric Quan on 02/02/2018 11:22:11 [name], [name] ([id number]) -------------------------------------------------------------------------------- Patient/Caregiver Education Details Patient Name: Alexa Lowes P. Date of Service: 02/02/2018 11:15 AM Medical Record Number: [id number] Patient Account Number: [id number] Date of Birth/Gender: 1926-11-19 (84 y.o. Female) Treating RN: Montey Hora Primary Care Physician: Lucianne Lei Other Clinician: Referring Physician: Lucianne Lei Treating Physician/Extender: Tito Dine in Treatment: 57 Education Assessment Education Provided To: Patient Education Topics Provided Wound/Skin Impairment: Handouts: Other: do not remove mepitel and steri strips Methods: Explain/Verbal Responses: State content correctly Electronic Signature(s) Signed: 02/02/2018 4:38:42 PM By: Montey Hora Entered By: Montey Hora on 02/02/2018 12:42:53 [name], [name] ([id number]) -------------------------------------------------------------------------------- Wound Assessment Details Patient Name: Alexa Lowes P. Date of Service: 02/02/2018 11:15 AM Medical Record Number: [id number] Patient Account Number: [id number] Date of Birth/Sex: June 05, 1926 (84 y.o. Female) Treating RN: Carolyne Fiscal, Debi Primary Care Mischell Branford: Lucianne Lei Other Clinician: Referring Lyanne Kates: Lucianne Lei Treating Victoria Euceda/Extender: [name] Weeks in Treatment: 35 Wound Status Wound Number: 1 Primary Etiology: Diabetic Wound/Ulcer of the Lower Extremity Wound Location: Left Lower Leg - Anterior Secondary Trauma, Other Wounding Event: Trauma Etiology: Date Acquired: 05/02/2017 Wound Status: Open Weeks Of Treatment: 35 Comorbid Anemia, Hypertension, Type II Diabetes, Clustered Wound: No History: Osteoarthritis Photos Photo Uploaded By: Gretta Cool, BSN, RN, CWS, Kim on 02/02/2018  16:49:13 Wound Measurements Length: (cm) 6 Width: (cm) 1.9 Depth: (cm) 0.2 Area: (cm) 8.954 Volume: (cm) 1.791 % Reduction in Area: -86.3% % Reduction in Volume: -86.4% Epithelialization: Small (1-33%) Tunneling: No Undermining: No Wound Description Classification: Grade 2 Foul Odor Wound Margin: Flat and Intact Slough/Fi Exudate Amount: Large Exudate Type: Serosanguineous Exudate Color: red, brown After Cleansing: No brino Yes Wound Bed Granulation Amount: Small (1-33%) Exposed Structure Granulation Quality: Red Fascia Exposed: Yes Necrotic Amount: Large (67-100%) Fat Layer (Subcutaneous Tissue) Exposed: Yes Necrotic Quality: Eschar, Adherent Slough Tendon Exposed: No Muscle Exposed: Yes Necrosis of Muscle: No Joint Exposed: No Bone Exposed: Yes [name], [name] ([id number]) Periwound Skin Texture Texture Color No Abnormalities Noted: No No Abnormalities Noted: No Callus: No Atrophie Blanche: No Crepitus: No Cyanosis: No Excoriation: No Ecchymosis: No Induration: No Erythema: No Rash: No Hemosiderin Staining: Yes Scarring: Yes Mottled: No Pallor: No Moisture Rubor: No No Abnormalities Noted: No Dry / Scaly: No Temperature / Pain Maceration: Yes Temperature: No Abnormality Tenderness on Palpation: Yes Wound Preparation Ulcer Cleansing: Rinsed/Irrigated with Saline Topical Anesthetic Applied: Other: lidocaine 4%, Treatment Notes Wound #1 (Left, Anterior Lower Leg) 1. Cleansed with: Clean wound with Normal Saline 2. Anesthetic Topical Lidocaine 4% cream to wound bed prior to debridement 3. Peri-wound Care: Skin Prep 4. Dressing Applied: Other dressing (specify in notes) 5. Secondary Dressing Applied Dry Gauze Kerlix/Conform 7. Secured with Tape Notes theraskin applied in clinic today, xtrasorb, Horticulturist, commercial) Signed: 02/02/2018 4:15:20 PM By: Alric Quan Entered By: Alric Quan on 02/02/2018  11:28:46 [name], [name] ([id number]) -------------------------------------------------------------------------------- Vitals Details Patient Name: Alexa Lowes P. Date of Service: 02/02/2018 11:15 AM Medical Record Number: [id number] Patient Account Number: [id number] Date of Birth/Sex: 06-13-1926 (84 y.o. Female) Treating RN: Carolyne Fiscal, Debi Primary Care Reno Clasby: Lucianne Lei Other Clinician: Referring Gearldean Lomanto: Lucianne Lei Treating Noralyn Karim/Extender: Tito Dine in Treatment: 35 Vital Signs Time Taken: 11:22 Temperature (F): 97.7 Height (in): 65 Pulse (bpm): 84 Weight (lbs): 131 Respiratory  Rate (breaths/min): 16 Body Mass Index (BMI): 21.8 Blood Pressure (mmHg): 148/64 Reference Range: 80 - 120 mg / dl Electronic Signature(s) Signed: 02/02/2018 4:15:20 PM By: Alric Quan Entered By: Alric Quan on 02/02/2018 11:23:40

## 2018-02-09 DIAGNOSIS — E11622 Type 2 diabetes mellitus with other skin ulcer: Secondary | ICD-10-CM | POA: Diagnosis not present

## 2018-02-10 NOTE — Progress Notes (Signed)
AYLSSA, HERRIG ([id number]) Visit Report for 02/09/2018 Arrival Information Details Patient Name: [name], [name] Date of Service: 02/09/2018 3:00 PM Medical Record Number: [id number] Patient Account Number: [id number] Date of Birth/Sex: 1926-11-13 (84 y.o. Female) Treating RN: Carolyne Fiscal, Debi Primary Care Jann Ra: Lucianne Lei Other Clinician: Referring Felicite Zeimet: Lucianne Lei Treating Deforrest Bogle/Extender: Tito Dine in Treatment: 30 Visit Information History Since Last Visit All ordered tests and consults were completed: No Patient Arrived: Cane Any new allergies or adverse reactions: No Arrival Time: 15:41 Had a fall or experienced change in No Accompanied By: grandaughter activities of daily living that may affect Transfer Assistance: EasyPivot Patient risk of falls: Lift Signs or symptoms of abuse/neglect since last visito No Patient Identification Verified: Yes Hospitalized since last visit: No Secondary Verification Process Yes Has Dressing in Place as Prescribed: Yes Completed: Pain Present Now: No Patient Requires Transmission-Based No Precautions: Patient Has Alerts: Yes Patient Alerts: 01/04/2018 ABI @ [facility] (R) 0.69 (L) 0.81 Electronic Signature(s) Signed: 02/09/2018 4:40:08 PM By: Alric Quan Entered By: Alric Quan on 02/09/2018 15:41:46 Gautreau, ZJQBHALP P. ([id number]) -------------------------------------------------------------------------------- Clinic Level of Care Assessment Details Patient Name: Alexa Lowes P. Date of Service: 02/09/2018 3:00 PM Medical Record Number: [id number] Patient Account Number: [id number] Date of Birth/Sex: Oct 16, 1926 (84 y.o. Female) Treating RN: Carolyne Fiscal, Debi Primary Care Lorraina Spring: Lucianne Lei Other Clinician: Referring Benedetto Ryder: Lucianne Lei Treating Ladell Lea/Extender: Tito Dine in Treatment: 63 Clinic Level of Care Assessment Items TOOL 4 Quantity Score X - Use when only an  EandM is performed on FOLLOW-UP visit 1 0 ASSESSMENTS - Nursing Assessment / Reassessment X - Reassessment of Co-morbidities (includes updates in patient status) 1 10 X- 1 5 Reassessment of Adherence to Treatment Plan ASSESSMENTS - Wound and Skin Assessment / Reassessment X - Simple Wound Assessment / Reassessment - one wound 1 5 []  - 0 Complex Wound Assessment / Reassessment - multiple wounds []  - 0 Dermatologic / Skin Assessment (not related to wound area) ASSESSMENTS - Focused Assessment []  - Circumferential Edema Measurements - multi extremities 0 []  - 0 Nutritional Assessment / Counseling / Intervention []  - 0 Lower Extremity Assessment (monofilament, tuning fork, pulses) []  - 0 Peripheral Arterial Disease Assessment (using hand held doppler) ASSESSMENTS - Ostomy and/or Continence Assessment and Care []  - Incontinence Assessment and Management 0 []  - 0 Ostomy Care Assessment and Management (repouching, etc.) PROCESS - Coordination of Care X - Simple Patient / Family Education for ongoing care 1 15 []  - 0 Complex (extensive) Patient / Family Education for ongoing care []  - 0 Staff obtains Programmer, systems, Records, Test Results / Process Orders []  - 0 Staff telephones HHA, Nursing Homes / Clarify orders / etc []  - 0 Routine Transfer to another Facility (non-emergent condition) []  - 0 Routine Hospital Admission (non-emergent condition) []  - 0 New Admissions / Biomedical engineer / Ordering NPWT, Apligraf, etc. []  - 0 Emergency Hospital Admission (emergent condition) X- 1 10 Simple Discharge Coordination Lunney, Aster P. ([id number]) []  - 0 Complex (extensive) Discharge Coordination PROCESS - Special Needs []  - Pediatric / Minor Patient Management 0 []  - 0 Isolation Patient Management []  - 0 Hearing / Language / Visual special needs []  - 0 Assessment of Community assistance (transportation, D/C planning, etc.) []  - 0 Additional assistance / Altered mentation []   - 0 Support Surface(s) Assessment (bed, cushion, seat, etc.) INTERVENTIONS - Wound Cleansing / Measurement []  - Simple Wound Cleansing - one wound 0 []  - 0 Complex Wound Cleansing - multiple wounds X-  1 5 Wound Imaging (photographs - any number of wounds) []  - 0 Wound Tracing (instead of photographs) []  - 0 Simple Wound Measurement - one wound []  - 0 Complex Wound Measurement - multiple wounds INTERVENTIONS - Wound Dressings []  - Small Wound Dressing one or multiple wounds 0 X- 1 15 Medium Wound Dressing one or multiple wounds []  - 0 Large Wound Dressing one or multiple wounds []  - 0 Application of Medications - topical []  - 0 Application of Medications - injection INTERVENTIONS - Miscellaneous []  - External ear exam 0 []  - 0 Specimen Collection (cultures, biopsies, blood, body fluids, etc.) []  - 0 Specimen(s) / Culture(s) sent or taken to Lab for analysis []  - 0 Patient Transfer (multiple staff / Civil Service fast streamer / Similar devices) []  - 0 Simple Staple / Suture removal (25 or less) []  - 0 Complex Staple / Suture removal (26 or more) []  - 0 Hypo / Hyperglycemic Management (close monitor of Blood Glucose) []  - 0 Ankle / Brachial Index (ABI) - do not check if billed separately []  - 0 Vital Signs Jackson, Cayli P. ([id number]) Has the patient been seen at the hospital within the last three years: Yes Total Score: 65 Level Of Care: New/Established - Level 2 Electronic Signature(s) Signed: 02/09/2018 5:23:08 PM By: Alric Quan Entered By: Alric Quan on 02/09/2018 16:44:14 Woodle, [name] ([id number]) -------------------------------------------------------------------------------- Encounter Discharge Information Details Patient Name: Alexa Lowes P. Date of Service: 02/09/2018 3:00 PM Medical Record Number: [id number] Patient Account Number: [id number] Date of Birth/Sex: [birth year] (84 y.o. Female) Treating RN: Carolyne Fiscal, Debi Primary Care Ashe Gago:  Lucianne Lei Other Clinician: Referring Brettney Ficken: Lucianne Lei Treating Karmine Kauer/Extender: Tito Dine in Treatment: 66 Encounter Discharge Information Items Discharge Pain Level: 0 Discharge Condition: Stable Ambulatory Status: Cane Discharge Destination: Home Transportation: Private Auto Accompanied By: grandaughter Schedule Follow-up Appointment: Yes Medication Reconciliation completed and No provided to Patient/Care Monte Zinni: Clinical Summary of Care: Electronic Signature(s) Signed: 02/09/2018 4:40:08 PM By: Alric Quan Entered By: Alric Quan on 02/09/2018 15:49:30 Mack, [name] ([id number]) -------------------------------------------------------------------------------- Patient/Caregiver Education Details Patient Name: Alexa Lowes P. Date of Service: 02/09/2018 3:00 PM Medical Record Number: [id number] Patient Account Number: [id number] Date of Birth/Gender: Apr 08, 1926 (84 y.o. Female) Treating RN: Carolyne Fiscal, Debi Primary Care Physician: Lucianne Lei Other Clinician: Referring Physician: Lucianne Lei Treating Physician/Extender: Tito Dine in Treatment: 25 Education Assessment Education Provided To: Patient and Caregiver grandaughter Education Topics Provided Wound/Skin Impairment: Handouts: Caring for Your Ulcer, Other: change dressing as ordered Methods: Demonstration, Explain/Verbal Responses: State content correctly Electronic Signature(s) Signed: 02/09/2018 4:40:08 PM By: Alric Quan Entered By: Alric Quan on 02/09/2018 15:49:08 Viti, Peytin P. ([id number]) -------------------------------------------------------------------------------- Wound Assessment Details Patient Name: Alexa Lowes P. Date of Service: 02/09/2018 3:00 PM Medical Record Number: [id number] Patient Account Number: [id number] Date of Birth/Sex: 1926/04/07 (84 y.o. Female) Treating RN: Carolyne Fiscal, Debi Primary Care Anastasija Anfinson: Lucianne Lei Other Clinician: Referring Prather Failla: Lucianne Lei Treating Nikoleta Dady/Extender: Tito Dine in Treatment: 36 Wound Status Wound Number: 1 Primary Etiology: Diabetic Wound/Ulcer of the Lower Extremity Wound Location: Left Lower Leg - Anterior Secondary Trauma, Other Wounding Event: Trauma Etiology: Date Acquired: 05/02/2017 Wound Status: Open Weeks Of Treatment: 36 Comorbid Anemia, Hypertension, Type II Diabetes, Clustered Wound: No History: Osteoarthritis Photos Photo Uploaded By: Alric Quan on 02/09/2018 17:20:31 Wound Measurements Length: (cm) 6 Width: (cm) 1.9 Depth: (cm) 0.2 Area: (cm) 8.954 Volume: (cm) 1.791 % Reduction in Area: -86.3% % Reduction in Volume: -86.4% Epithelialization: Small (1-33%) Tunneling: No Undermining:  No Wound Description Classification: Grade 2 Wound Margin: Flat and Intact Exudate Amount: Large Exudate Type: Serosanguineous Exudate Color: red, brown Foul Odor After Cleansing: No Slough/Fibrino Yes Wound Bed Granulation Amount: Small (1-33%) Exposed Structure Granulation Quality: Red Fascia Exposed: Yes Necrotic Amount: Large (67-100%) Fat Layer (Subcutaneous Tissue) Exposed: Yes Necrotic Quality: Eschar, Adherent Slough Tendon Exposed: No Muscle Exposed: Yes Necrosis of Muscle: No Joint Exposed: No Bone Exposed: Yes Eggenberger, Niema P. ([id number]) Periwound Skin Texture Texture Color No Abnormalities Noted: No No Abnormalities Noted: No Callus: No Atrophie Blanche: No Crepitus: No Cyanosis: No Excoriation: No Ecchymosis: No Induration: No Erythema: No Rash: No Hemosiderin Staining: Yes Scarring: Yes Mottled: No Pallor: No Moisture Rubor: No No Abnormalities Noted: No Dry / Scaly: No Temperature / Pain Maceration: Yes Temperature: No Abnormality Tenderness on Palpation: Yes Wound Preparation Ulcer Cleansing: Rinsed/Irrigated with Saline Topical Anesthetic Applied: Other: lidocaine  4%, Assessment Notes only outside dressing changed unable to visibly see wound kept measurements the same Treatment Notes Wound #1 (Left, Anterior Lower Leg) 1. Cleansed with: Clean wound with Normal Saline 5. Secondary Dressing Applied Dry Gauze Kerlix/Conform 7. Secured with Recruitment consultant) Signed: 02/09/2018 4:40:08 PM By: Alric Quan Entered By: Alric Quan on 02/09/2018 15:42:34

## 2018-02-16 ENCOUNTER — Encounter: Payer: Medicare Other | Admitting: Nurse Practitioner

## 2018-02-16 DIAGNOSIS — E11622 Type 2 diabetes mellitus with other skin ulcer: Secondary | ICD-10-CM | POA: Diagnosis not present

## 2018-02-20 NOTE — Progress Notes (Signed)
[name], [name] ([id number]) Visit Report for 02/16/2018 Chief Complaint Document Details Patient Name: [name], [name] Date of Service: 02/16/2018 1:00 PM Medical Record Number: [id number] Patient Account Number: [id number] Date of Birth/Sex: [birth year] (84 y.o. F) Treating RN: [name] Primary Care Provider: Lucianne [name] Other Clinician: Referring Provider: Lucianne [name] Treating Provider/Extender: [name] in Treatment: 37 Information Obtained from: Patient Chief Complaint Patients presents for follow up for left lower leg ulcer Electronic Signature(s) Signed: 02/16/2018 1:37:44 PM By: [name] Entered By: [name] on 02/16/2018 13:37:43 [name], [name] ([id number]) -------------------------------------------------------------------------------- Cellular or Tissue Based Product Details Patient Name: Alexa Lowes P. Date of Service: 02/16/2018 1:00 PM Medical Record Number: [id number] Patient Account Number: [id number] Date of Birth/Sex: March 18, 1926 (84 y.o. F) Treating RN: [name] Primary Care Provider: Lucianne [name] Other Clinician: Referring Provider: Lucianne [name] Treating Provider/Extender: [name] in Treatment: 37 Cellular or Tissue Based Wound #1 Left,Anterior Lower Leg Product Type Applied to: Performed By: Physician Alexa Cousins, NP Cellular or Tissue Based Theraskin Product Type: Pre-procedure Verification/Time Yes - 13:31 Out Taken: Location: trunk / arms / legs Wound Size (sq cm): 11.02 Product Size (sq cm): 13 Waste Size (sq cm): 2 Waste Reason: wound size Amount of Product Applied (sq cm): 11 Instrument Used: Forceps, Scissors Lot #: [id number] Expiration Date: 10/08/2018 Fenestrated: No Reconstituted: No Secured: Yes Secured With: Steri-Strips Dressing Applied: Yes Primary Dressing: mepitel one Procedural Pain: 0 Post Procedural Pain: 0 Response to Treatment: Procedure was tolerated well Post  Procedure Diagnosis Same as Pre-procedure Electronic Signature(s) Signed: 02/16/2018 1:45:42 PM By: [name] Entered By: [name] on 02/16/2018 13:45:42 [name], [name] ([id number]) -------------------------------------------------------------------------------- Debridement Details Patient Name: Alexa Lowes P. Date of Service: 02/16/2018 1:00 PM Medical Record Number: [id number] Patient Account Number: [id number] Date of Birth/Sex: 1926/01/14 (84 y.o. F) Treating RN: [name] Primary Care Provider: Lucianne [name] Other Clinician: Referring Provider: Lucianne [name] Treating Provider/Extender: [name] in Treatment: 37 Debridement Performed for Wound #1 Left,Anterior Lower Leg Assessment: Performed By: Physician Alexa Cousins, NP Debridement Type: Debridement Severity of Tissue Pre Fat layer exposed Debridement: Pre-procedure Verification/Time Yes - 13:27 Out Taken: Start Time: 13:27 Pain Control: Lidocaine 4% Topical Solution Level: Skin/Subcutaneous Tissue Total Area Debrided (L x W): 5.8 (cm) x 1.9 (cm) = 11.02 (cm) Tissue and other material Viable, Non-Viable, Fibrin/Slough, Subcutaneous debrided: Instrument: Curette Bleeding: Minimum Hemostasis Achieved: Pressure End Time: 13:29 Procedural Pain: 0 Post Procedural Pain: 0 Response to Treatment: Procedure was tolerated well Post Debridement Measurements of Total Wound Length: (cm) 5.8 Width: (cm) 1.9 Depth: (cm) 0.2 Volume: (cm) 1.731 Character of Wound/Ulcer Post Debridement: Improved Severity of Tissue Post Debridement: Fat layer exposed Post Procedure Diagnosis Same as Pre-procedure Electronic Signature(s) Signed: 02/16/2018 5:31:19 PM By: [name] Signed: 02/16/2018 5:43:37 PM By: [name] Entered By: [name] on 02/16/2018 13:33:54 [name], [name] ([id number]) -------------------------------------------------------------------------------- HPI  Details Patient Name: Alexa Lowes P. Date of Service: 02/16/2018 1:00 PM Medical Record Number: [id number] Patient Account Number: [id number] Date of Birth/Sex: 10/29/1926 (84 y.o. F) Treating RN: [name] Primary Care Provider: Lucianne [name] Other Clinician: Referring Provider: Lucianne [name] Treating Provider/Extender: [name] in Treatment: 6 History of Present Illness HPI Description: 84 year old patient was seen in the ED about 10 days ago for a history of abrasion to the left lower extremity while she was boarding a bus and scraped the anterior part of her left leg. She has been a diabetic for many years and has been  taking treatment regularly. Her past medical history is also significant for anemia arthritis constipation and hypertension and is status post abdominal hysterectomy. She has never been a smoker. after her ER visit she was advised to apply Bactroban ointment to the wound twice a day and continue to monitor her blood glucose levels. her last hemoglobin A1c was done 3 years ago and was 6.6% 06/04/17 patient left anterior shin wound appears to be doing well although it is still somewhat dry despite the treatment with Medihoney. We have been using Kerlex following the Medihoney application and I believe this is just not retaining that much moisture. Nonetheless there is no evidence of infection. 06/11/17 on evaluation today patient appears to be doing a little bit worse in regard to her wound. The entirety of the wound is dry and unfortunately the Medihoney does not seem to be helping this. That is even with the dressing changes that I made last week to try to retain more moisture. She has not tried [employer] as of yet. She was also noncompressible when testing for ABIs. 07/01/2017 -- she had a arterial studies done and was seen by Dr. Lucky [name]. her noninvasive studies showed noncompressible vessels on the right but brisk waveforms and digital pressures of 71 on the right  consistent with only mild arterial insufficienc. On the left her ABI was 0.47 and this may be falsely elevated due to calcification. No digital pressures were obtained on the left and this is consistent with severe arterial insufficiency. this critical limb threatening situation on the left, would make wound healing difficult and it represents a serious situation and he has recommended an angiography with possible revascularization. The patient desired to defer the decision to she discuss with the family members. 07/09/17 on evaluation today patient appears to be doing fairly well in regard to her left anterior lower extremity wound. She has been tolerating the dressing changes without complication we have been utilizing Santyl at this time. She tells me that she is having no significant pain compared to what she has had in the past. She has decided after discussing with family that she is going to go forward with the surgery with Dr. dew. This is to restore better blood flow to left lower extremity. With that it does appear that her lower extremity wound is making some progress albeit slow. No fevers, chills, nausea, or vomiting noted at this time. 07/16/17 on evaluation today patient's wound appears to be doing roughly about the same although some of the slough is clearing off. [name] she has her appointment with vascular next week on Thursday the 23rd. With that being said her wound does not appear to be hurting as badly as it has in the past which is good news. No fevers, chills, nausea, or vomiting noted at this time. 07/30/2017 -- she had surgery on 07/22/2017 --indications being nonhealing ulcer on the left leg with a noninvasive study showing marked reduction in ABI of less than 0.5 and no digital pressure on her left leg. she had a percutaneous transluminal angioplasty of the left common and external iliac arteries. She then had a stent placed to the left external iliac arteries and to the  left common iliac artery. 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 The flow distally was poor and there were multiple areas of high-grade stenosis or occlusion of the distal SFA and the popliteal artery with only a diseased  peroneal artery as runoff distally. She has a postop appointment on September 23 08/20/2017 -- she went back to the vascular office for an appointment on 08/12/2017 and bilateral ABIs were done which were notable for moderate right lower extremity arterial disease and unable to obtain left ankle brachial indices due to absent Doppler velocities in the posterior tibial artery and the anterior tibial artery. Monophasic waveforms to the left femoral and popliteal artery. this was worrisome for a failure of stents placed and lower extremity angiogram was recommended. as was SHATASIA, CUTSHAW ([id number]) done on 08/16/2017 and the stents placed in the left eye Lex system had some mild narrowing at the iliac bifurcation and the leading edge of the stent proximally was poor wall apposition with stenosis in the 50-60% range in the right common iliac artery. There was also occlusion of the common femoral artery and the origin of the SFA and profunda femoris artery. Flow distally was poor but they appeared to be multiple areas of high-grade stenosis or occlusion in the distal SFA and popliteal artery with only a diseased peroneal artery. in another stent was placed and deployed. The femoral occlusion would need to be treated surgically. 08/27/17 on evaluation today patient appears to be doing about the same in regard to her left for extremity wound. Unfortunately secondary to her vascular status this is not a very well healing wound at this time. She has been tolerating the dressing changes she has some discomfort but mainly with cleansing of the wound not at other times. No fevers, chills, nausea,  or vomiting noted at this time. 09/17/2017 -- the patient was recently discharged from the hospital where she was admitted between 09/08/2017 and 09/10/2017, when she had a procedure on her left femoral artery with the endarterectomy and a patch. she will be going back for review this coming week. 09/24/2017 -- was seen on 09/18/2017 in the ER, for bleeding from her wound which was debrided earlier during the day. At the time she was seen there was no active bleeding. A thrombin pad was placed over the area and she was asked to change the outside dressing but come back to the wound center as planned. she was also seen at the [facility] office for a review after her surgery and bilateral lower extremity ABI when compared to the previous ones showed minimal improvement of arterial blood flow. She was set up for a CT angiogram of the abdomen and pelvis and lower extremities to see the degree of peripheral arterial disease 10/28/2017 -- had a x-ray of the left tibia and fibula done on 10/07/2017 -- showed no bony acute abnormality she was reviewed by Dr. Corene Cornea dew on 10/26/2017, and he reviewed her CT angiogram and this demonstrated a widely patent left femoral endarterectomy including the origins of the femoral artery. She also had moderate distal disease in the popliteal artery, distal SFA and tibial disease. Her peroneal artery is a dominant runoff distally.he felt she had adequate perfusion for healing the mid calf level and he is putting off all surgical intervention for now and is going to see her back in 2-3 months for noninvasive studies. She also has a new wound on the plantar aspect of the left big toe where she pulled a piece of skin and has caused a superficial ulceration 11/19/17 on evaluation today patient appears to be doing decently well at this point in time. It does appear that her Wound VAC has been approved as ordered  by Dr. Con Memos however she has not  received that as of yet. Nonetheless the wound bed in general appears to have minimal slough noted at this time in a fairly good amount of granulation although there is to be a noted in the center of the wound. No fevers, chills, nausea, or vomiting noted at this time. 12/02/17 on evaluation today patient appears to be doing about the same in regard to her left anterior shin wound. Unfortunately her left toe wound appears to still be doing about the same as far as the eschar covering. This has softened up a bit to the point that it is now loose and could have selective debridement which would allow for the Santyl to work better. Fortunately she is having no pain. 12/10/17 on evaluation today patient appears to be doing fairly well in regard to her left anterior shin wound and that it at least appears to be stable. With that being said she does have really no significant healing at this point this wound seems to be mainly just maintaining. Subsequently patient also has the left toe wound which does not seem to be progressing. There is still nonviable tissue overlying the surface of the wound and this doesn't even seem to be loosening up very well at all. And patient has been using the Santyl. I do think this may need selective debridement today. Fortunately there does not appear to be any evidence of infection although I am concerned about the possibility that patient's blood flow is not likely sufficient to be able to tolerate appropriate wound healing. This was discussed with patient today as well although obviously I would leave the determination up to vascular surgery. She will be seeing Dr. dew on February 5 for repeat vascular studies and then in office evaluation. 12/17/17 on evaluation today patient appears to be doing about the same in regard to her left shin and left great toe ulcers. She has been performing the dressing changes as previously recommended over the past week. With that being  said unfortunately nothing seems to be changing much in regard to the dimensions of the wound and I do feel again that this may be related to vascular flow. Patient's daughter was present during the evaluation today and we did discuss this as well. I will be detailed in greater detail in the plan. [name], [name] ([id number]) 12/23/17 she is here in follow up evaluation of a left great toe and left anterior leg ulcer. There is noted ischemic changes (urple, dry gangrene, shriveling effect laterally) to the left great toe, although not overtly cold. Contact was made to Dr Ozella Almond office regarding possibility of seeing her in the office today or tomorrow as this is an acute change; they recommended she go to the emergency department for evaluation by Dr Ozella Almond partner Dr Curley Spice. The ER was contacted, spoke with Tiffany, regarding this. Patient was accompanied by her son. The left anterior shin ulcer is improved in measurement, overtly exposed bone with area of discoloration/necrosis. will follow up after hospitalization/vascular intervention 12/31/17 on evaluation today patient is status post having gone to the ER last week on 12/23/17. 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 This was performed by Dr. Cleda Mccreedy on 12/23/17. Subsequently Dr. Corene Cornea do with [facility] specialist performed percutaneous evaluation and intervention of patient's left lower extremity on 12/27/17. Angioplasty was performed following angiogram of the left peroneal artery and tibial angioplasty was also performed of the left  popliteal artery. Angioplasty of the mid and distal superficial femoral arteries was likewise undertaken. Stents were placed in the left above knee popliteal artery following angioplasty and then a second stent placed in the mid- SFA secondary to 50% residual stenosis following the angioplasty. Fortunately patient was much better post procedure as far as stenosis/occlusions  were concerned and she did have definitely much better blood flow which was noted to be evident on evaluation today. 01/07/18 on evaluation today patient appears to be doing well on her left lower extremity in regard to the anterior shin ulcer. She has been tolerating the dressing changes without complication. The good news is nothing appears to be doing worse and in general she does seem to have more granulation tissue. There is still bone exposure. Fortunately she's been tolerating the dressing changes. Patient's a the eyes that Dr. Bunnie Domino office on 01/04/18 revealed a right ABI of 0.69 and a left ABI of 0.81 obviously this is doing very well and significantly improved. 01/12/18; patient with a traumatic wound on the left anterior tibial area. Fairly substantial wound was still open tibia however the amount of exposed bone is apparently a lot better. We have applied for TheraSkins but have not yet received approval. She has been using silver collagen as mentioned everything appears better. She had recent revascularization by [facility] and had a left ABI of 0.81. 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 This is still wrapped and I did not look at this today, apparently we are not following secondary to this being a recent surgical wound. 01/19/18; patient with a traumatic wound on the left anterior tibial area. Only a small amount of open tibia still remains. We applied Theraskin #1. She has an another wound at the right great toe amputation site. This is being followed by her surgeon on a 82 day global. Our nurse inadvertently remove the wet to dry dressing and reported there is exposed bone. We are not following this as of yet 02/02/18; she is back for a deep traumatic wound over the left anterior tibia. We applied the first TheraSkin 2 weeks ago. She returns today in follow-up. The wound is better there is no exposed bone. She has home health going out they will  change the superficial dressings on this in a week there also dealing with a right great toe amputation site, we are not following this as of yet 02/16/18-she is here in follow up evaluation for left lower extremity wound. Theraskin was reapplied. She saw Dr Cleda Mccreedy today who redressed her amputation site Electronic Signature(s) Signed: 02/16/2018 1:42:30 PM By: [name] Entered By: [name] on 02/16/2018 13:42:30 [name], [name] ([id number]) -------------------------------------------------------------------------------- Physician Orders Details Patient Name: Alexa Lowes P. Date of Service: 02/16/2018 1:00 PM Medical Record Number: [id number] Patient Account Number: [id number] Date of Birth/Sex: 08/19/1926 (84 y.o. 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 o Cleanse wound with mild soap and water Anesthetic (add to Medication List) Wound #1 Left,Anterior Lower Leg o Topical Lidocaine 4% cream applied to wound bed prior to debridement (In Clinic Only). Skin Barriers/Peri-Wound Care Wound #1 Left,Anterior Lower Leg o Skin Prep Primary Wound Dressing Wound #1 Left,Anterior Lower Leg o Dry Gauze Secondary Dressing o ABD and Kerlix/Conform Wound #1 Left,Anterior Lower  Leg o Other - x-sorb Dressing Change Frequency Wound #1 Left,Anterior Lower Leg o Change dressing every week - Theraskin applied: do not remove anything beyond the steri-strips. Follow-up Appointments Wound #1 Left,Anterior Lower Leg o Return Appointment in 2 weeks. Home Health Wound #1 Left,Anterior Lower Leg ANGELYN, OSTERBERG ([id number]) o [place] Visits - [place] Nurse may visit PRN to address patientos wound care needs. o FACE TO FACE ENCOUNTER: MEDICARE and MEDICAID PATIENTS: I certify that this patient is under my care and that I had a face-to-face encounter that meets the physician face-to-face encounter requirements with this patient on this date. The encounter with the patient was in whole or in part for the following MEDICAL CONDITION: (primary reason for [place]) MEDICAL NECESSITY: I certify, that based on my findings, NURSING services are a medically necessary home health service. HOME BOUND STATUS: I certify that my clinical findings support that this patient is homebound (i.e., Due to illness or injury, pt requires aid of supportive devices such as crutches, cane, wheelchairs, walkers, the use of special transportation or the assistance of another person to leave their place of residence. There is a normal inability to leave the home and doing so requires considerable and taxing effort. Other absences are for medical reasons / religious services and are infrequent or of short duration when for other reasons). o If current dressing causes regression in wound condition, may D/C ordered dressing product/s and apply Normal Saline Moist Dressing daily until next [place] / Other MD appointment. [place] of regression in wound condition at [phone]. o Please direct any NON-WOUND related issues/requests for orders to patient's Primary Care Physician Advanced Therapies Wound #1 Left,Anterior  Lower Leg o Theraskin application in clinic; including contact layer, fixation with steri strips, dry gauze and cover dressing. - Theraskin must stay in place with mepitel for 2 weeks with steri strips intact. HHRN - DO NOT REMOVE THIS DRESSING Electronic Signature(s) Signed: 02/16/2018 5:31:19 PM By: [name] Signed: 02/16/2018 5:43:37 PM By: [name] Entered By: [name] on 02/16/2018 13:44:20 [name], [name] ([id number]) -------------------------------------------------------------------------------- Problem List Details Patient Name: Alexa Lowes P. Date of Service: 02/16/2018 1:00 PM Medical Record Number: [id number] Patient Account Number: [id number] Date of Birth/Sex: 04-13-1926 (84 y.o. F) Treating RN: [name] Primary Care Provider: Lucianne [name] Other Clinician: Referring Provider: Lucianne [name] Treating Provider/Extender: [name] in Treatment: 37 Active Problems ICD-10 Impacting Encounter Code Description Active Date Wound Healing Diagnosis E11.622 Type 2 diabetes mellitus with other skin ulcer 05/28/2017 Yes L97.522 Non-pressure chronic ulcer of other part of left foot with fat 12/10/2017 Yes layer exposed I70.242 Atherosclerosis of native arteries of left leg with ulceration of 07/01/2017 Yes calf L97.824 Non-pressure chronic ulcer of other part of left lower leg with 12/23/2017 Yes necrosis of bone I70.245 Atherosclerosis of native arteries of left leg with ulceration of 12/23/2017 Yes other part of foot Inactive Problems Resolved Problems Electronic Signature(s) Signed: 02/16/2018 1:37:22 PM By: [name] Entered By: [name] on 02/16/2018 13:37:21 [place], [id number]  P. ([id number]) -------------------------------------------------------------------------------- Progress Note Details Patient Name: [name], [name] Date of Service: 02/16/2018 1:00 PM Medical Record Number: [id number] Patient Account Number: [id number] Date  of Birth/Sex: 1926-07-20 (84 y.o. F) Treating RN: [name] Primary Care Provider: Lucianne [name] Other Clinician: Referring Provider: Lucianne [name] Treating Provider/Extender: [name] in Treatment: 63 Subjective Chief Complaint Information obtained from Patient Patients presents for follow up for left lower leg ulcer History of Present Illness (HPI) 84 year old patient was seen in the ED about 10 days ago for a history of abrasion to the left lower extremity while she was boarding a bus and scraped the anterior part of her left leg. She has been a diabetic for many years and has been taking treatment regularly. Her past medical history is also significant for anemia arthritis constipation and hypertension and is status post abdominal hysterectomy. She has never been a smoker. after her ER visit she was advised to apply Bactroban ointment to the wound twice a day and continue to monitor her blood glucose levels. her last hemoglobin A1c was done 3 years ago and was 6.6% 06/04/17 patient left anterior shin wound appears to be doing well although it is still somewhat dry despite the treatment with Medihoney. We have been using Kerlex following the Medihoney application and I believe this is just not retaining that much moisture. Nonetheless there is no evidence of infection. 06/11/17 on evaluation today patient appears to be doing a little bit worse in regard to her wound. The entirety of the wound is dry and unfortunately the Medihoney does not seem to be helping this. That is even with the dressing changes that I made last week to try to retain more moisture. She has not tried [employer] as of yet. She was also noncompressible when testing for ABIs. 07/01/2017 -- she had a arterial studies done and was seen by Dr. Lucky [name]. her noninvasive studies showed noncompressible vessels on the right but brisk waveforms and digital pressures of 71 on the right consistent with only mild arterial  insufficienc. On the left her ABI was 0.47 and this may be falsely elevated due to calcification. No digital pressures were obtained on the left and this is consistent with severe arterial insufficiency. this critical limb threatening situation on the left, would make wound healing difficult and it represents a serious situation and he has recommended an angiography with possible revascularization. The patient desired to defer the decision to she discuss with the family members. 07/09/17 on evaluation today patient appears to be doing fairly well in regard to her left anterior lower extremity wound. She has been tolerating the dressing changes without complication we have been utilizing Santyl at this time. She tells me that she is having no significant pain compared to what she has had in the past. She has decided after discussing with family that she is going to go forward with the surgery with Dr. dew. This is to restore better blood flow to left lower extremity. With that it does appear that her lower extremity wound is making some progress albeit slow. No fevers, chills, nausea, or vomiting noted at this time. 07/16/17 on evaluation today patient's wound appears to be doing roughly about the same although some of the slough is clearing off. [name] she has her appointment with vascular next week on Thursday the 23rd. With that being said her wound does not appear to be hurting as badly as it has in the past which is good news. No fevers, chills, nausea,  or vomiting noted at this time. 07/30/2017 -- she had surgery on 07/22/2017 --indications being nonhealing ulcer on the left leg with a noninvasive study showing marked reduction in ABI of less than 0.5 and no digital pressure on her left leg. she had a percutaneous transluminal angioplasty of the left common and external iliac arteries. She then had a stent placed to the left external iliac arteries and to the left common iliac artery. the left  lower extremity showed occlusion of the common femoral artery and the origins of the SFA and the profunda femoris artery. The flow distally was poor and there were multiple areas of high-grade stenosis or occlusion of the distal SFA and the popliteal artery with only a diseased peroneal artery as runoff distally. [name], [name] ([id number]) She has a postop appointment on September 23 08/20/2017 -- she went back to the vascular office for an appointment on 08/12/2017 and bilateral ABIs were done which were notable for moderate right lower extremity arterial disease and unable to obtain left ankle brachial indices due to absent Doppler velocities in the posterior tibial artery and the anterior tibial artery. Monophasic waveforms to the left femoral and popliteal artery. this was worrisome for a failure of stents placed and lower extremity angiogram was recommended. as was done on 08/16/2017 and the stents placed in the left eye Lex system had some mild narrowing at the iliac bifurcation and the leading edge of the stent proximally was poor wall apposition with stenosis in the 50-60% range in the right common iliac artery. There was also occlusion of the common femoral artery and the origin of the SFA and profunda femoris artery. Flow distally was poor but they appeared to be multiple areas of high-grade stenosis or occlusion in the distal SFA and popliteal artery with only a diseased peroneal artery. in another stent was placed and deployed. The femoral occlusion would need to be treated surgically. 08/27/17 on evaluation today patient appears to be doing about the same in regard to her left for extremity wound. Unfortunately secondary to her vascular status this is not a very well healing wound at this time. She has been tolerating the dressing changes she has some discomfort but mainly with cleansing of the wound not at other times. No fevers, chills, nausea, or vomiting noted at this  time. 09/17/2017 -- the patient was recently discharged from the hospital where she was admitted between 09/08/2017 and 09/10/2017, when she had a procedure on her left femoral artery with the endarterectomy and a patch. she will be going back for review this coming week. 09/24/2017 -- was seen on 09/18/2017 in the ER, for bleeding from her wound which was debrided earlier during the day. At the time she was seen there was no active bleeding. A thrombin pad was placed over the area and she was asked to change the outside dressing but come back to the wound center as planned. she was also seen at the [facility] office for a review after her surgery and bilateral lower extremity ABI when compared to the previous ones showed minimal improvement of arterial blood flow. She was set up for a CT angiogram of the abdomen and pelvis and lower extremities to see the degree of peripheral arterial disease 10/28/2017 -- had a x-ray of the left tibia and fibula done on 10/07/2017 -- showed no bony acute abnormality she was reviewed by Dr. Corene Cornea dew on 10/26/2017, and he reviewed her CT angiogram and this demonstrated a  widely patent left femoral endarterectomy including the origins of the femoral artery. She also had moderate distal disease in the popliteal artery, distal SFA and tibial disease. Her peroneal artery is a dominant runoff distally.he felt she had adequate perfusion for healing the mid calf level and he is putting off all surgical intervention for now and is going to see her back in 2-3 months for noninvasive studies. She also has a new wound on the plantar aspect of the left big toe where she pulled a piece of skin and has caused a superficial ulceration 11/19/17 on evaluation today patient appears to be doing decently well at this point in time. It does appear that her Wound VAC has been approved as ordered by Dr. Con Memos however she has not received that as of yet.  Nonetheless the wound bed in general appears to have minimal slough noted at this time in a fairly good amount of granulation although there is to be a noted in the center of the wound. No fevers, chills, nausea, or vomiting noted at this time. 12/02/17 on evaluation today patient appears to be doing about the same in regard to her left anterior shin wound. Unfortunately her left toe wound appears to still be doing about the same as far as the eschar covering. This has softened up a bit to the point that it is now loose and could have selective debridement which would allow for the Santyl to work better. Fortunately she is having no pain. 12/10/17 on evaluation today patient appears to be doing fairly well in regard to her left anterior shin wound and that it at least appears to be stable. With that being said she does have really no significant healing at this point this wound seems to be mainly just maintaining. Subsequently patient also has the left toe wound which does not seem to be progressing. There is still nonviable tissue overlying the surface of the wound and this doesn't even seem to be loosening up very well at all. And patient has been using the Santyl. I do think this may need selective debridement today. Fortunately there does not appear to be any evidence of infection although I am concerned about the possibility that patient's blood flow is not likely sufficient to be able to tolerate appropriate wound healing. This was discussed with patient today as well although obviously I would leave the determination up to vascular surgery. She will be seeing Dr. dew on February 5 for repeat vascular studies and then in office evaluation. [name], [name] ([id number]) 12/17/17 on evaluation today patient appears to be doing about the same in regard to her left shin and left great toe ulcers. She has been performing the dressing changes as previously recommended over the past week. With that  being said unfortunately nothing seems to be changing much in regard to the dimensions of the wound and I do feel again that this may be related to vascular flow. Patient's daughter was present during the evaluation today and we did discuss this as well. I will be detailed in greater detail in the plan. 12/23/17 she is here in follow up evaluation of a left great toe and left anterior leg ulcer. There is noted ischemic changes (urple, dry gangrene, shriveling effect laterally) to the left great toe, although not overtly cold. Contact was made to Dr Ozella Almond office regarding possibility of seeing her in the office today or tomorrow as this is an acute change; they recommended she go to the emergency  department for evaluation by Dr Ozella Almond partner Dr Curley Spice. The ER was contacted, spoke with Tiffany, regarding this. Patient was accompanied by her son. The left anterior shin ulcer is improved in measurement, overtly exposed bone with area of discoloration/necrosis. will follow up after hospitalization/vascular intervention 12/31/17 on evaluation today patient is status post having gone to the ER last week on 12/23/17. 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 This was performed by Dr. Cleda Mccreedy on 12/23/17. Subsequently Dr. Corene Cornea do with [facility] performed percutaneous evaluation and intervention of patient's left lower extremity on 12/27/17. Angioplasty was performed following angiogram of the left peroneal artery and tibial angioplasty was also performed of the left popliteal artery. Angioplasty of the mid and distal superficial femoral arteries was likewise undertaken. Stents were placed in the left above knee popliteal artery following angioplasty and then a second stent placed in the mid- SFA secondary to 50% residual stenosis following the angioplasty. Fortunately patient was much better post procedure as far as stenosis/occlusions were concerned and she did  have definitely much better blood flow which was noted to be evident on evaluation today. 01/07/18 on evaluation today patient appears to be doing well on her left lower extremity in regard to the anterior shin ulcer. She has been tolerating the dressing changes without complication. The good news is nothing appears to be doing worse and in general she does seem to have more granulation tissue. There is still bone exposure. Fortunately she's been tolerating the dressing changes. Patient's a the eyes that Dr. Bunnie Domino office on 01/04/18 revealed a right ABI of 0.69 and a left ABI of 0.81 obviously this is doing very well and significantly improved. 01/12/18; patient with a traumatic wound on the left anterior tibial area. Fairly substantial wound was still open tibia however the amount of exposed bone is apparently a lot better. We have applied for TheraSkins but have not yet received approval. She has been using silver collagen as mentioned everything appears better. She had recent revascularization by [facility] and had a left ABI of 0.81. 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 This is still wrapped and I did not look at this today, apparently we are not following secondary to this being a recent surgical wound. 01/19/18; patient with a traumatic wound on the left anterior tibial area. Only a small amount of open tibia still remains. We applied Theraskin #1. She has an another wound at the right great toe amputation site. This is being followed by her surgeon on a 46 day global. Our nurse inadvertently remove the wet to dry dressing and reported there is exposed bone. We are not following this as of yet 02/02/18; she is back for a deep traumatic wound over the left anterior tibia. We applied the first TheraSkin 2 weeks ago. She returns today in follow-up. The wound is better there is no exposed bone. She has home health going out they will change the superficial  dressings on this in a week there also dealing with a right great toe amputation site, we are not following this as of yet 02/16/18-she is here in follow up evaluation for left lower extremity wound. Theraskin was reapplied. She saw Dr Cleda Mccreedy today who redressed her amputation site Patient History Information obtained from Patient. Social History Never smoker, Marital Status - Separated, Alcohol Use - Never, Drug Use - No History, Caffeine Use - Rarely. [name], Matisyn P. (  [id number]) Objective Constitutional Vitals Time Taken: 1:00 PM, Height: 65 in, Weight: 131 lbs, BMI: 21.8, Temperature: 97.6 F, Pulse: 79 bpm, Respiratory Rate: 16 breaths/min, Blood Pressure: 147/71 mmHg. Integumentary (Hair, Skin) Wound #1 status is Open. Original cause of wound was Trauma. The wound is located on the Left,Anterior Lower Leg. The wound measures 5.8cm length x 1.9cm width x 0.2cm depth; 8.655cm^2 area and 1.731cm^3 volume. There is bone, muscle, Fat Layer (Subcutaneous Tissue) Exposed, and fascia exposed. There is no tunneling or undermining noted. There is a large amount of serosanguineous drainage noted. The wound margin is flat and intact. There is medium (34-66%) hyper - granulation within the wound bed. There is a medium (34-66%) amount of necrotic tissue within the wound bed including Eschar and Adherent Slough. The periwound skin appearance exhibited: Scarring, Maceration, Hemosiderin Staining. The periwound skin appearance did not exhibit: Callus, Crepitus, Excoriation, Induration, Rash, Dry/Scaly, Atrophie Blanche, Cyanosis, Ecchymosis, Mottled, Pallor, Rubor, Erythema. Periwound temperature was noted as No Abnormality. The periwound has tenderness on palpation. 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 There was a Skin/Subcutaneous Tissue Debridement ([id number]) debridement with total area of 11.02 sq cm performed by Alexa Cousins, NP. with the following instrument(s): Curette to remove Viable and Non-Viable tissue/material including Fibrin/Slough and Subcutaneous after achieving pain control using Lidocaine 4% Topical Solution. A time out was conducted at 13:27, prior to the start of the procedure. A Minimum amount of bleeding was controlled with Pressure. The procedure was tolerated well with a pain level of 0 throughout and a pain level of 0 following the procedure. Post Debridement Measurements: 5.8cm length x 1.9cm width x 0.2cm depth; 1.731cm^3 volume. Character of Wound/Ulcer Post Debridement is improved. Severity of Tissue Post Debridement is: Fat layer exposed. Post procedure Diagnosis Wound #1: Same as Pre-Procedure Pre-procedure diagnosis of Wound #1 is a Diabetic Wound/Ulcer of the Lower Extremity located on the Left,Anterior Lower Leg. A skin graft procedure using a bioengineered skin substitute/cellular or tissue based product was performed by Alexa Cousins, NP with the following instrument(s): Forceps and Scissors. Theraskin was applied and secured with Steri-Strips. 11 sq cm of product was utilized and 2 sq cm was wasted due to wound size. Post Application, mepitel one was applied. A Time [name], [name]. ([id number]) Out was conducted at 13:31, prior to the start of the procedure. The procedure was tolerated well with a pain level of 0 throughout and a pain level  of 0 following the procedure. Post procedure Diagnosis Wound #1: Same as Pre-Procedure . Plan Wound Cleansing: Wound #1 Left,Anterior Lower Leg: Clean wound with Normal Saline. Cleanse wound with mild soap and water Anesthetic (add to Medication List): Wound #1 Left,Anterior Lower Leg: Topical Lidocaine 4% cream applied to wound bed prior to debridement (In Clinic Only). Skin Barriers/Peri-Wound Care: Wound #1 Left,Anterior Lower Leg: Skin Prep Primary Wound Dressing: Wound #1 Left,Anterior Lower Leg: Dry Gauze Secondary Dressing: ABD and  Kerlix/Conform Wound #1 Left,Anterior Lower Leg: Other - x-sorb Dressing Change Frequency: Wound #1 Left,Anterior Lower Leg: Change dressing every week - Theraskin applied: do not remove anything beyond the steri-strips. Follow-up Appointments: Wound #1 Left,Anterior Lower Leg: Return Appointment in 2 weeks. Home Health: Wound #1 Left,Anterior Lower Leg: [place] Visits - [facility] Nurse may visit PRN to address patient s wound care needs. FACE TO FACE ENCOUNTER: MEDICARE and MEDICAID PATIENTS: I certify that this patient is under my care and that I had a face-to-face encounter that meets the physician face-to-face encounter requirements with this patient on this date. The encounter with the patient was in whole or in part for the following MEDICAL CONDITION: (primary reason for [place]) MEDICAL NECESSITY: I certify, that based on my findings, NURSING services are a medically necessary home health service. HOME BOUND STATUS: I certify that my clinical findings support that this patient is homebound (i.e., Due to illness or injury, pt requires aid of supportive devices such as crutches, cane, wheelchairs, walkers, the use of special transportation or the assistance of another person to leave their place of residence. There is a normal inability to leave the home and doing so requires considerable and taxing effort.  Other absences are for medical reasons / religious services and are infrequent or of short duration when for other reasons). If current dressing causes regression in wound condition, may D/C ordered dressing product/s and apply Normal Saline Moist Dressing daily until next [place] / Other MD appointment. [place] of regression in wound condition at [phone]. Please direct any NON-WOUND related issues/requests for orders to patient's Primary Care Physician Advanced Therapies: Wound #1 Left,Anterior Lower Leg: Theraskin application in clinic; including contact layer, fixation with steri strips, dry gauze and cover dressing. - Theraskin must stay in place with mepitel for 2 weeks with steri strips intact. HHRN - DO NOT REMOVE THIS DRESSING Holzhauer, Japji P. ([id number]) 1. theraskin reapplied today 2. follow up in two weeks Electronic Signature(s) Signed: 02/16/2018 5:32:36 PM By: [name] Previous Signature: 02/16/2018 1:42:59 PM Version By: [name] Entered By: [name] on 02/16/2018 17:32:36 Fodor, [name] ([id number]) -------------------------------------------------------------------------------- ROS/PFSH Details Patient Name: Alexa Lowes P. Date of Service: 02/16/2018 1:00 PM Medical Record Number: [id number] Patient Account Number: [id number] Date of Birth/Sex: 05/03/1926 (84 y.o. F) Treating RN: [name] Primary Care Provider: Lucianne [name] Other Clinician: Referring Provider: Lucianne [name] Treating Provider/Extender: [name] in Treatment: 37 Information Obtained From Patient Wound History Do you currently have one or more open woundso Yes How many open wounds do you currently haveo 1 Approximately how long have you had your woundso 3 weeks How have you been treating your wound(s) until nowo open to air Has your wound(s) ever healed and then re-openedo No Have you had any lab work done in the past montho  No Have you tested positive for an antibiotic resistant organism (MRSA, VRE)o No Have you tested positive for osteomyelitis (bone infection)o No Have you had any tests for circulation on your legso No Hematologic/Lymphatic Medical History: Positive for: Anemia Cardiovascular Medical History: Positive for: Hypertension Endocrine Medical History: Positive for: Type II Diabetes Treated with: Oral agents Musculoskeletal Medical History: Positive for: Osteoarthritis Oncologic Medical History: Negative for: Received Chemotherapy; Received Radiation Immunizations Pneumococcal Vaccine: Received Pneumococcal Vaccination: Yes Immunization Notes: up to date Implantable Devices Family and Social History DAROLYN, DOUBLE ([id number]) Never smoker; Marital Status - Separated; Alcohol Use: Never; Drug Use: No  History; Caffeine Use: Rarely; Financial Concerns: No; Food, Clothing or Shelter Needs: No; Support System Lacking: No; Transportation Concerns: No; Advanced Directives: No; Patient does not want information on Advanced Directives Physician Affirmation I have reviewed and agree with the above information. Electronic Signature(s) Signed: 02/16/2018 5:31:19 PM By: [name] Signed: 02/18/2018 4:43:23 PM By: Gretta Cool, BSN, RN, CWS, Kim RN, BSN Entered By: [name] on 02/16/2018 13:42:38 Kovacevic, [name] ([id number]) -------------------------------------------------------------------------------- SuperBill Details Patient Name: Alexa Lowes P. Date of Service: 02/16/2018 Medical Record Number: [id number] Patient Account Number: [id number] Date of Birth/Sex: 06-Oct-1926 (84 y.o. 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 I70.245 Atherosclerosis of native arteries of left leg with ulceration of other part of foot Facility Procedures CPT4 Code Description: [id number] Q4121- Theraskin per 1sq cm small -13 sq cm Modifier: Quantity: 67 CPT4 Code Description: [id number] 15271 - SKIN SUB GRAFT TRNK/ARM/LEG ICD-10 Diagnosis Description E11.622 Type 2 diabetes mellitus with other skin ulcer L97.522 Non-pressure chronic ulcer of other part of left foot with fat layer e M09.470 Atherosclerosis  of native arteries of left leg with ulceration of calf L97.824 Non-pressure chronic ulcer of other part of left lower leg with necros Modifier: xposed is of bone Quantity: 1 Physician Procedures CPT4 Code Description: [id number] 15271 - WC PHYS SKIN SUB GRAFT TRNK/ARM/LEG ICD-10 Diagnosis Description E11.622 Type 2 diabetes mellitus with other skin ulcer L97.522 Non-pressure chronic ulcer of other part of left foot with fat layer ex Q94.765  Atherosclerosis of native arteries of left leg with ulceration of calf L97.824 Non-pressure chronic ulcer of other part of left lower leg with necrosi Modifier: posed s of bone Quantity: 1 Electronic Signature(s) Signed: 02/16/2018 5:19:02 PM By: [name] Previous Signature: 02/16/2018 5:09:22 PM Version By: [name] Entered By: [name] on 02/16/2018 17:19:02

## 2018-02-20 NOTE — Progress Notes (Signed)
Alexa, [name] ([id number]) Visit Report for 02/16/2018 Arrival Information Details Patient Name: [name], [name] Date of Service: 02/16/2018 1:00 PM Medical Record Number: [id number] Patient Account Number: [id number] Date of Birth/Sex: May 01, 1926 (84 y.o. F) Treating RN: Cornell Barman Primary Care Saory Carriero: [name] Other Clinician: Referring Reisa Coppola: [name] Treating [name]/Extender: Cathie Olden in Treatment: 15 Visit Information History Since Last Visit Added or deleted any medications: No Patient Arrived: [name] Any new allergies or adverse reactions: No Arrival Time: 13:04 Had a fall or experienced change in No Accompanied By: sons activities of daily living that may affect Transfer Assistance: None risk of falls: Patient Identification Verified: Yes Signs or symptoms of abuse/neglect since last No Secondary Verification Process Yes visito Completed: Hospitalized since last visit: No Patient Requires Transmission-Based No Has Dressing in Place as Prescribed: Yes Precautions: Has Footwear/Offloading in Place as Prescribed: Yes Patient Has Alerts: Yes Left: Surgical Shoe with Patient Alerts: 01/04/2018 ABI @ Pressure Relief Insole AVVS Pain Present Now: No (R) 0.69 (L) 0.81 Electronic Signature(s) Signed: 02/17/2018 10:13:52 AM By: Lorine Bears RCP, RRT, CHT Entered By: Lorine Bears on 02/16/2018 13:06:23 [name], Alexa College ([id number]) -------------------------------------------------------------------------------- Encounter Discharge Information Details Patient Name: [name] P. Date of Service: 02/16/2018 1:00 PM Medical Record Number: [id number] Patient Account Number: [id number] Date of Birth/Sex: 1926-01-08 (84 y.o. F) Treating RN: Cornell Barman Primary Care Metta Koranda: [name] Other Clinician: Referring Jaleil Renwick: [name] Treating Elvyn Krohn/Extender: Cathie Olden in Treatment:  37 Encounter Discharge Information Items Discharge Pain Level: 0 Discharge Condition: Stable Ambulatory Status: [name] Discharge Destination: Home Transportation: Private Auto Accompanied By: self Schedule Follow-up Appointment: Yes Medication Reconciliation completed and No provided to Patient/Care [name]: Provided on Clinical Summary of Care: 02/16/2018 Form Type Recipient Paper Patient GW Electronic Signature(s) Signed: 02/16/2018 5:43:37 PM By: Montey Hora Entered By: Montey Hora on 02/16/2018 16:41:20 Helbling, Alexa College ([id number]) -------------------------------------------------------------------------------- Lower Extremity Assessment Details Patient Name: [name] P. Date of Service: 02/16/2018 1:00 PM Medical Record Number: [id number] Patient Account Number: [id number] Date of Birth/Sex: 1926/04/13 (84 y.o. F) Treating RN: Montey Hora Primary Care [name]: [name] Other Clinician: Referring Meghen Akopyan: [name] Treating [name]/Extender: Cathie Olden in Treatment: 37 Vascular Assessment Pulses: Posterior Tibial Extremity colors, hair growth, and conditions: Extremity Color: [Left:Hyperpigmented] Hair Growth on Extremity: [Left:No] Temperature of Extremity: [Left:Warm] Electronic Signature(s) Signed: 02/16/2018 5:43:37 PM By: Montey Hora Entered By: Montey Hora on 02/16/2018 13:18:45 Gasner, Edwena P. ([id number]) -------------------------------------------------------------------------------- Multi Wound Chart Details Patient Name: [name] P. Date of Service: 02/16/2018 1:00 PM Medical Record Number: [id number] Patient Account Number: [id number] Date of Birth/Sex: [birth year] (84 y.o. F) Treating RN: Montey Hora Primary Care Seda Kronberg: [name] Other Clinician: Referring [name]: [name] Treating Jessalynn Mccowan/Extender: Cathie Olden in Treatment: 37 Vital Signs Height(in): 65 Pulse(bpm):  8 Weight(lbs): 131 Blood Pressure(mmHg): 147/71 Body Mass Index(BMI): 22 Temperature(F): 97.6 Respiratory Rate 16 (breaths/min): Photos: [1:No Photos] [N/A:N/A] Wound Location: [1:Left Lower Leg - Anterior] [N/A:N/A] Wounding Event: [1:Trauma] [N/A:N/A] Primary Etiology: [1:Diabetic Wound/Ulcer of the Lower Extremity] [N/A:N/A] Secondary Etiology: [1:Trauma, Other] [N/A:N/A] Comorbid History: [1:Anemia, Hypertension, Type II Diabetes, Osteoarthritis] [N/A:N/A] Date Acquired: [1:05/02/2017] [N/A:N/A] Weeks of Treatment: [1:37] [N/A:N/A] Wound Status: [1:Open] [N/A:N/A] Measurements L x W x D [1:5.8x1.9x0.2] [N/A:N/A] (cm) Area (cm) : [1:8.655] [N/A:N/A] Volume (cm) : [1:1.731] [N/A:N/A] % Reduction in Area: [1:-80.00%] [N/A:N/A] % Reduction in Volume: [1:-80.10%] [N/A:N/A] Classification: [1:Grade 2] [N/A:N/A] Exudate Amount: [1:Large] [N/A:N/A] Exudate Type: [1:Serosanguineous] [N/A:N/A] Exudate Color: [1:red, brown] [N/A:N/A] Wound Margin: [  1:Flat and Intact] [N/A:N/A] Granulation Amount: [1:Medium (34-66%)] [N/A:N/A] Necrotic Amount: [1:Medium (34-66%)] [N/A:N/A] Necrotic Tissue: [1:Eschar, Adherent Slough] [N/A:N/A] Exposed Structures: [1:Fascia: Yes Fat Layer (Subcutaneous Tissue) Exposed: Yes Muscle: Yes Bone: Yes Tendon: No Joint: No] [N/A:N/A] Epithelialization: [1:Small (1-33%)] [N/A:N/A] Debridement: [1:Debridement ([id number])] [N/A:N/A] Pre-procedure [1:13:27] [N/A:N/A] Verification/Time Out Taken: Pain Control: [1:Lidocaine 4% Topical Solution] [N/A:N/A] Tissue Debrided: Fibrin/Slough, Subcutaneous N/A N/A Level: Skin/Subcutaneous Tissue N/A N/A Debridement Area (sq cm): 11.02 N/A N/A Instrument: Curette N/A N/A Bleeding: Minimum N/A N/A Hemostasis Achieved: Pressure N/A N/A Procedural Pain: 0 N/A N/A Post Procedural Pain: 0 N/A N/A Debridement Treatment Procedure was tolerated well N/A N/A Response: Post Debridement 5.8x1.9x0.2 N/A N/A Measurements  L x W x D (cm) Post Debridement Volume: 1.731 N/A N/A (cm) Periwound Skin Texture: Scarring: Yes N/A N/A Excoriation: No Induration: No Callus: No Crepitus: No Rash: No Periwound Skin Moisture: Maceration: Yes N/A N/A Dry/Scaly: No Periwound Skin Color: Hemosiderin Staining: Yes N/A N/A Atrophie Blanche: No Cyanosis: No Ecchymosis: No Erythema: No Mottled: No Pallor: No Rubor: No Temperature: No Abnormality N/A N/A Tenderness on Palpation: Yes N/A N/A Wound Preparation: Ulcer Cleansing: N/A N/A Rinsed/Irrigated with Saline Topical Anesthetic Applied: Other: lidocaine 4% Procedures Performed: Debridement N/A N/A Treatment Notes Electronic Signature(s) Signed: 02/16/2018 1:37:29 PM By: Lawanda Cousins Entered By: Lawanda Cousins on 02/16/2018 13:37:28 Brazzle, Alexa College ([id number]) -------------------------------------------------------------------------------- [place] Details Patient Name: [name] P. Date of Service: 02/16/2018 1:00 PM Medical Record Number: [id number] Patient Account Number: [id number] Date of Birth/Sex: Jan 01, 1926 (84 y.o. F) Treating RN: Montey Hora Primary Care Yonas Bunda: [name] Other Clinician: Referring Malacki Mcphearson: [name] Treating Ambert Virrueta/Extender: Cathie Olden in Treatment: 37 Active Inactive ` Abuse / Safety / Falls / Self Care Management Nursing Diagnoses: Potential for falls Goals: Patient will remain injury free related to falls Date Initiated: 05/28/2017 Target Resolution Date: 07/23/2017 Goal Status: Active Interventions: Assess fall risk on admission and as needed Notes: ` Orientation to the Wound Care Program Nursing Diagnoses: Knowledge deficit related to the wound healing center program Goals: Patient/caregiver will verbalize understanding of the [facility] Program Date Initiated: 05/28/2017 Target Resolution Date: 07/23/2017 Goal Status:  Active Interventions: Provide education on orientation to the wound center Notes: ` Wound/Skin Impairment Nursing Diagnoses: Impaired tissue integrity Goals: Ulcer/skin breakdown will have a volume reduction of 30% by week 4 Date Initiated: 05/28/2017 Target Resolution Date: 07/23/2017 Goal Status: Active Ulcer/skin breakdown will have a volume reduction of 50% by week 8 Date Initiated: 05/28/2017 Target Resolution Date: 07/23/2017 KARELY, HURTADO ([id number]) Goal Status: Active Ulcer/skin breakdown will have a volume reduction of 80% by week 12 Date Initiated: 05/28/2017 Target Resolution Date: 07/23/2017 Goal Status: Active Ulcer/skin breakdown will heal within 14 weeks Date Initiated: 05/28/2017 Target Resolution Date: 07/23/2017 Goal Status: Active Interventions: Assess patient/caregiver ability to obtain necessary supplies Assess patient/caregiver ability to perform ulcer/skin care regimen upon admission and as needed Assess ulceration(s) every visit Notes: Electronic Signature(s) Signed: 02/16/2018 5:43:37 PM By: Montey Hora Entered By: Montey Hora on 02/16/2018 13:25:37 Wollard, OINOMVEH P. ([id number]) -------------------------------------------------------------------------------- Pain Assessment Details Patient Name: [name] P. Date of Service: 02/16/2018 1:00 PM Medical Record Number: [id number] Patient Account Number: [id number] Date of Birth/Sex: Jan 12, 1926 (84 y.o. F) Treating RN: Cornell Barman Primary Care Makala Fetterolf: [name] Other Clinician: Referring Barbarann Kelly: [name] Treating Tanairi Cypert/Extender: Cathie Olden in Treatment: 37 Active Problems Location of Pain Severity and Description of Pain Patient Has Paino No Site Locations Pain Management and Medication Current Pain  Management: Electronic Signature(s) Signed: 02/17/2018 10:13:52 AM By: Paulla Fore, RRT, CHT Signed: 02/18/2018 4:43:23 PM By: Gretta Cool, BSN, RN,  CWS, Kim RN, BSN Entered By: Lorine Bears on 02/16/2018 13:06:41 [name] ([id number]) -------------------------------------------------------------------------------- Patient/Caregiver Education Details Patient Name: [name] P. Date of Service: 02/16/2018 1:00 PM Medical Record Number: [id number] Patient Account Number: [id number] Date of Birth/Gender: 1926/04/15 (84 y.o. F) Treating RN: Montey Hora Primary Care Physician: [name] Other Clinician: Referring Physician: Lucianne [name] Treating Physician/Extender: Cathie Olden in Treatment: 81 Education Assessment Education Provided To: Caregiver HHRN Education Topics Provided Wound/Skin Impairment: Handouts: Other: written wound care orders Methods: Pharmacist, hospital) Signed: 02/16/2018 5:43:37 PM By: Montey Hora Entered By: Montey Hora on 02/16/2018 16:41:50 Severs, Alexa College ([id number]) -------------------------------------------------------------------------------- Wound Assessment Details Patient Name: [name] P. Date of Service: 02/16/2018 1:00 PM Medical Record Number: [id number] Patient Account Number: [id number] Date of Birth/Sex: 04/25/1926 (84 y.o. F) Treating RN: Montey Hora Primary Care Steffen Hase: [name] Other Clinician: Referring Tiernan Millikin: [name] Treating Lateef Juncaj/Extender: Cathie Olden in Treatment: 44 Wound Status Wound Number: 1 Primary Etiology: Diabetic Wound/Ulcer of the Lower Extremity Wound Location: Left Lower Leg - Anterior Secondary Trauma, Other Wounding Event: Trauma Etiology: Date Acquired: 05/02/2017 Wound Status: Open Weeks Of Treatment: 37 Comorbid Anemia, Hypertension, Type II Diabetes, Clustered Wound: No History: Osteoarthritis Photos Photo Uploaded By: Montey Hora on 02/16/2018 16:25:35 Wound Measurements Length: (cm) 5.8 Width: (cm) 1.9 Depth: (cm) 0.2 Area: (cm) 8.655 Volume:  (cm) 1.731 % Reduction in Area: -80% % Reduction in Volume: -80.1% Epithelialization: Small (1-33%) Tunneling: No Undermining: No Wound Description Classification: Grade 2 Wound Margin: Flat and Intact Exudate Amount: Large Exudate Type: Serosanguineous Exudate Color: red, brown Foul Odor After Cleansing: No Slough/Fibrino Yes Wound Bed Granulation Amount: Medium (34-66%) Exposed Structure Granulation Quality: Hyper-granulation Fascia Exposed: Yes Necrotic Amount: Medium (34-66%) Fat Layer (Subcutaneous Tissue) Exposed: Yes Necrotic Quality: Eschar, Adherent Slough Tendon Exposed: No Muscle Exposed: Yes Necrosis of Muscle: No Joint Exposed: No Bone Exposed: Yes Pinales, Orissa P. ([id number]) Periwound Skin Texture Texture Color No Abnormalities Noted: No No Abnormalities Noted: No Callus: No Atrophie Blanche: No Crepitus: No Cyanosis: No Excoriation: No Ecchymosis: No Induration: No Erythema: No Rash: No Hemosiderin Staining: Yes Scarring: Yes Mottled: No Pallor: No Moisture Rubor: No No Abnormalities Noted: No Dry / Scaly: No Temperature / Pain Maceration: Yes Temperature: No Abnormality Tenderness on Palpation: Yes Wound Preparation Ulcer Cleansing: Rinsed/Irrigated with Saline Topical Anesthetic Applied: Other: lidocaine 4%, Treatment Notes Wound #1 (Left, Anterior Lower Leg) 1. Cleansed with: Clean wound with Normal Saline 2. Anesthetic Topical Lidocaine 4% cream to wound bed prior to debridement 4. Dressing Applied: Mepitel Other dressing (specify in notes) 5. Secondary Dressing Applied ABD Pad Dry Gauze Kerlix/Conform Notes Theraskin applied by Denny Peon today in clinic, drawtex Electronic Signature(s) Signed: 02/16/2018 5:43:37 PM By: Montey Hora Entered By: Montey Hora on 02/16/2018 13:18:02 [place], Alexa College ([id number]) -------------------------------------------------------------------------------- Vitals Details Patient  Name: [name] P. Date of Service: 02/16/2018 1:00 PM Medical Record Number: [id number] Patient Account Number: [id number] Date of Birth/Sex: [birth year] (84 y.o. F) Treating RN: Cornell Barman Primary Care Dilyn Smiles: [name] Other Clinician: Referring Maloree Uplinger: [name] Treating Shylah Dossantos/Extender: Cathie Olden in Treatment: 37 Vital Signs Time Taken: 13:00 Temperature (F): 97.6 Height (in): 65 Pulse (bpm): 79 Weight (lbs): 131 Respiratory Rate (breaths/min): 16 Body Mass Index (BMI): 21.8 Blood Pressure (mmHg): 147/71 Reference Range: 80 - 120 mg / dl Electronic Signature(s) Signed: 02/17/2018  10:13:52 AM By: Lorine Bears RCP, RRT, CHT Entered By: Lorine Bears on 02/16/2018 13:07:12

## 2018-03-02 ENCOUNTER — Encounter: Payer: Medicare Other | Attending: Internal Medicine | Admitting: Internal Medicine

## 2018-03-02 DIAGNOSIS — E11622 Type 2 diabetes mellitus with other skin ulcer: Secondary | ICD-10-CM | POA: Diagnosis not present

## 2018-03-02 DIAGNOSIS — E1151 Type 2 diabetes mellitus with diabetic peripheral angiopathy without gangrene: Secondary | ICD-10-CM | POA: Diagnosis not present

## 2018-03-02 DIAGNOSIS — L97829 Non-pressure chronic ulcer of other part of left lower leg with unspecified severity: Secondary | ICD-10-CM | POA: Insufficient documentation

## 2018-03-02 DIAGNOSIS — I10 Essential (primary) hypertension: Secondary | ICD-10-CM | POA: Insufficient documentation

## 2018-03-02 DIAGNOSIS — I70242 Atherosclerosis of native arteries of left leg with ulceration of calf: Secondary | ICD-10-CM | POA: Diagnosis not present

## 2018-03-07 NOTE — Progress Notes (Signed)
SIMRAT, KENDRICK ([id number]) Visit Report for 03/02/2018 Debridement Details Patient Name: [name], [name]. Date of Service: 03/02/2018 1:00 PM Medical Record Number: [id number] Patient Account Number: [id number] Date of Birth/Sex: Jun 11, 1926 (84 y.o. F) Treating RN: Cornell Barman Primary Care Provider: Lucianne Lei Other Clinician: Referring Provider: Lucianne Lei Treating Provider/Extender: Tito Dine in Treatment: 39 Debridement Performed for Wound #1 Left,Anterior Lower Leg Assessment: Performed By: Physician [name], MD Debridement Type: Debridement Severity of Tissue Pre Fat layer exposed Debridement: Pre-procedure Verification/Time Yes - 13:18 Out Taken: Start Time: 13:19 Pain Control: Other : hurricaine spray Total Area Debrided (L x W): 4 (cm) x 1.8 (cm) = 7.2 (cm) Tissue and other material Viable, Non-Viable, Subcutaneous debrided: Level: Skin/Subcutaneous Tissue Debridement Description: Excisional Instrument: Curette Bleeding: Moderate Hemostasis Achieved: Silver Nitrate End Time: 13:24 Procedural Pain: 0 Post Procedural Pain: 0 Response to Treatment: Procedure was tolerated well Post Debridement Measurements of Total Wound Length: (cm) 4 Width: (cm) 1.8 Depth: (cm) 0.2 Volume: (cm) 1.131 Character of Wound/Ulcer Post Debridement: Stable Severity of Tissue Post Debridement: Fat layer exposed Post Procedure Diagnosis Same as Pre-procedure Electronic Signature(s) Signed: 03/02/2018 4:49:21 PM By: Linton Ham MD Signed: 03/07/2018 9:08:37 AM By: Gretta Cool, BSN, RN, CWS, Kim RN, BSN Previous Signature: 03/02/2018 1:41:50 PM Version By: Gretta Cool, BSN, RN, CWS, Kim RN, BSN Entered By: Linton Ham on 03/02/2018 13:43:07 Novinger, Kyleeann [name]. ([id number]) -------------------------------------------------------------------------------- HPI Details Patient Name: Alexa Lowes [name]. Date of Service: 03/02/2018 1:00 PM Medical Record Number:  [id number] Patient Account Number: [id number] Date of Birth/Sex: [birth year] (84 y.o. F) Treating RN: Cornell Barman Primary Care Provider: Lucianne Lei Other Clinician: Referring Provider: Lucianne Lei Treating Provider/Extender: Tito Dine in Treatment: 70 History of Present Illness HPI Description: 84 year old patient was seen in the ED about 10 days ago for a history of abrasion to the left lower extremity while she was boarding a bus and scraped the anterior part of her left leg. She has been a diabetic for many years and has been taking treatment regularly. Her past medical history is also significant for anemia arthritis constipation and hypertension and is status post abdominal hysterectomy. She has never been a smoker. after her ER visit she was advised to apply Bactroban ointment to the wound twice a day and continue to monitor her blood glucose levels. her last hemoglobin A1c was done 3 years ago and was 6.6% 06/04/17 patient left anterior shin wound appears to be doing well although it is still somewhat dry despite the treatment with Medihoney. We have been using Kerlex following the Medihoney application and I believe this is just not retaining that much moisture. Nonetheless there is no evidence of infection. 06/11/17 on evaluation today patient appears to be doing a little bit worse in regard to her wound. The entirety of the wound is dry and unfortunately the Medihoney does not seem to be helping this. That is even with the dressing changes that I made last week to try to retain more moisture. She has not tried [employer] as of yet. She was also noncompressible when testing for ABIs. 07/01/2017 -- she had a arterial studies done and was seen by Dr. Lucky Cowboy. her noninvasive studies showed noncompressible vessels on the right but brisk waveforms and digital pressures of 71 on the right consistent with only mild arterial insufficienc. On the left her ABI was 0.47 and this may be  falsely elevated due to calcification. No digital pressures were obtained on the left and this is  consistent with severe arterial insufficiency. this critical limb threatening situation on the left, would make wound healing difficult and it represents a serious situation and he has recommended an angiography with possible revascularization. The patient desired to defer the decision to she discuss with the family members. 07/09/17 on evaluation today patient appears to be doing fairly well in regard to her left anterior lower extremity wound. She has been tolerating the dressing changes without complication we have been utilizing Santyl at this time. She tells me that she is having no significant pain compared to what she has had in the past. She has decided after discussing with family that she is going to go forward with the surgery with Dr. dew. This is to restore better blood flow to left lower extremity. With that it does appear that her lower extremity wound is making some progress albeit slow. No fevers, chills, nausea, or vomiting noted at this time. 07/16/17 on evaluation today patient's wound appears to be doing roughly about the same although some of the slough is clearing off. [name] she has her appointment with vascular next week on Thursday the 23rd. With that being said her wound does not appear to be hurting as badly as it has in the past which is good news. No fevers, chills, nausea, or vomiting noted at this time. 07/30/2017 -- she had surgery on 07/22/2017 --indications being nonhealing ulcer on the left leg with a noninvasive study showing marked reduction in ABI of less than 0.5 and no digital pressure on her left leg. she had a percutaneous transluminal angioplasty of the left common and external iliac arteries. She then had a stent placed to the left external iliac arteries and to the left common iliac artery. the left lower extremity showed occlusion of the common femoral artery  and the origins of the SFA and the profunda femoris artery. The flow distally was poor and there were multiple areas of high-grade stenosis or occlusion of the distal SFA and the popliteal artery with only a diseased peroneal artery as runoff distally. She has a postop appointment on September 23 08/20/2017 -- she went back to the vascular office for an appointment on 08/12/2017 and bilateral ABIs were done which were notable for moderate right lower extremity arterial disease and unable to obtain left ankle brachial indices due to absent Doppler velocities in the posterior tibial artery and the anterior tibial artery. Monophasic waveforms to the left femoral and popliteal artery. this was worrisome for a failure of stents placed and lower extremity angiogram was recommended. as was MELANNIE, METZNER ([id number]) done on 08/16/2017 and the stents placed in the left eye Lex system had some mild narrowing at the iliac bifurcation and the leading edge of the stent proximally was poor wall apposition with stenosis in the 50-60% range in the right common iliac artery. There was also occlusion of the common femoral artery and the origin of the SFA and profunda femoris artery. Flow distally was poor but they appeared to be multiple areas of high-grade stenosis or occlusion in the distal SFA and popliteal artery with only a diseased peroneal artery. in another stent was placed and deployed. The femoral occlusion would need to be treated surgically. 08/27/17 on evaluation today patient appears to be doing about the same in regard to her left for extremity wound. Unfortunately secondary to her vascular status this is not a very well healing wound at this time. She has been tolerating the dressing changes she has some discomfort  but mainly with cleansing of the wound not at other times. No fevers, chills, nausea, or vomiting noted at this time. 09/17/2017 -- the patient was recently discharged from the  hospital where she was admitted between 09/08/2017 and 09/10/2017, when she had a procedure on her left femoral artery with the endarterectomy and a patch. she will be going back for review this coming week. 09/24/2017 -- was seen on 09/18/2017 in the ER, for bleeding from her wound which was debrided earlier during the day. At the time she was seen there was no active bleeding. A thrombin pad was placed over the area and she was asked to change the outside dressing but come back to the wound center as planned. she was also seen at the [facility] office for a review after her surgery and bilateral lower extremity ABI when compared to the previous ones showed minimal improvement of arterial blood flow. She was set up for a CT angiogram of the abdomen and pelvis and lower extremities to see the degree of peripheral arterial disease 10/28/2017 -- had a x-ray of the left tibia and fibula done on 10/07/2017 -- showed no bony acute abnormality she was reviewed by Dr. Corene Cornea dew on 10/26/2017, and he reviewed her CT angiogram and this demonstrated a widely patent left femoral endarterectomy including the origins of the femoral artery. She also had moderate distal disease in the popliteal artery, distal SFA and tibial disease. Her peroneal artery is a dominant runoff distally.he felt she had adequate perfusion for healing the mid calf level and he is putting off all surgical intervention for now and is going to see her back in 2-3 months for noninvasive studies. She also has a new wound on the plantar aspect of the left big toe where she pulled a piece of skin and has caused a superficial ulceration 11/19/17 on evaluation today patient appears to be doing decently well at this point in time. It does appear that her Wound VAC has been approved as ordered by Dr. Con Memos however she has not received that as of yet. Nonetheless the wound bed in general appears to have minimal slough  noted at this time in a fairly good amount of granulation although there is to be a noted in the center of the wound. No fevers, chills, nausea, or vomiting noted at this time. 12/02/17 on evaluation today patient appears to be doing about the same in regard to her left anterior shin wound. Unfortunately her left toe wound appears to still be doing about the same as far as the eschar covering. This has softened up a bit to the point that it is now loose and could have selective debridement which would allow for the Santyl to work better. Fortunately she is having no pain. 12/10/17 on evaluation today patient appears to be doing fairly well in regard to her left anterior shin wound and that it at least appears to be stable. With that being said she does have really no significant healing at this point this wound seems to be mainly just maintaining. Subsequently patient also has the left toe wound which does not seem to be progressing. There is still nonviable tissue overlying the surface of the wound and this doesn't even seem to be loosening up very well at all. And patient has been using the Santyl. I do think this may need selective debridement today. Fortunately there does not appear to be any evidence of infection although I am concerned about  the possibility that patient's blood flow is not likely sufficient to be able to tolerate appropriate wound healing. This was discussed with patient today as well although obviously I would leave the determination up to vascular surgery. She will be seeing Dr. dew on February 5 for repeat vascular studies and then in office evaluation. 12/17/17 on evaluation today patient appears to be doing about the same in regard to her left shin and left great toe ulcers. She has been performing the dressing changes as previously recommended over the past week. With that being said unfortunately nothing seems to be changing much in regard to the dimensions of the wound and  I do feel again that this may be related to vascular flow. Patient's daughter was present during the evaluation today and we did discuss this as well. I will be detailed in greater detail in the plan. [name], Alexa ([id number]) 12/23/17 she is here in follow up evaluation of a left great toe and left anterior leg ulcer. There is noted ischemic changes (urple, dry gangrene, shriveling effect laterally) to the left great toe, although not overtly cold. Contact was made to Dr Ozella Almond office regarding possibility of seeing her in the office today or tomorrow as this is an acute change; they recommended she go to the emergency department for evaluation by Dr Ozella Almond partner Dr Curley Spice. The ER was contacted, spoke with Tiffany, regarding this. Patient was accompanied by her son. The left anterior shin ulcer is improved in measurement, overtly exposed bone with area of discoloration/necrosis. will follow up after hospitalization/vascular intervention 12/31/17 on evaluation today patient is status post having gone to the ER last week on 12/23/17. 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 This was performed by Dr. Cleda Mccreedy on 12/23/17. Subsequently Dr. Corene Cornea do with [facility] performed percutaneous evaluation and intervention of patient's left lower extremity on 12/27/17. Angioplasty was performed following angiogram of the left peroneal artery and tibial angioplasty was also performed of the left popliteal artery. Angioplasty of the mid and distal superficial femoral arteries was likewise undertaken. Stents were placed in the left above knee popliteal artery following angioplasty and then a second stent placed in the mid- SFA secondary to 50% residual stenosis following the angioplasty. Fortunately patient was much better post procedure as far as stenosis/occlusions were concerned and she did have definitely much better blood flow which was noted to be evident  on evaluation today. 01/07/18 on evaluation today patient appears to be doing well on her left lower extremity in regard to the anterior shin ulcer. She has been tolerating the dressing changes without complication. The good news is nothing appears to be doing worse and in general she does seem to have more granulation tissue. There is still bone exposure. Fortunately she's been tolerating the dressing changes. Patient's a the eyes that Dr. Bunnie Domino office on 01/04/18 revealed a right ABI of 0.69 and a left ABI of 0.81 obviously this is doing very well and significantly improved. 01/12/18; patient with a traumatic wound on the left anterior tibial area. Fairly substantial wound was still open tibia however the amount of exposed bone is apparently a lot better. We have applied for TheraSkins but have not yet received approval. She has been using silver collagen as mentioned everything appears better. She had recent revascularization by [facility] and had a left ABI of 0.81. 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 This  is still wrapped and I did not look at this today, apparently we are not following secondary to this being a recent surgical wound. 01/19/18; patient with a traumatic wound on the left anterior tibial area. Only a small amount of open tibia still remains. We applied Theraskin #1. She has an another wound at the right great toe amputation site. This is being followed by her surgeon on a 1 day global. Our nurse inadvertently remove the wet to dry dressing and reported there is exposed bone. We are not following this as of yet 02/02/18; she is back for a deep traumatic wound over the left anterior tibia. We applied the first TheraSkin 2 weeks ago. She returns today in follow-up. The wound is better there is no exposed bone. She has home health going out they will change the superficial dressings on this in a week there also dealing with a right great toe  amputation site, we are not following this as of yet 02/16/18-she is here in follow up evaluation for left lower extremity wound. Theraskin was reapplied. She saw Dr Cleda Mccreedy today who redressed her amputation site 03/02/18; patient is here for follow-up of her left lower extremity wound. We applied TheraSkin 2 weeks ago. She arrived with the wound bed completely granulated. This required debridement. Dimensions are better healthier looking wound. Nevertheless I didn't think a subsequent TheraSkin was necessary Electronic Signature(s) Signed: 03/02/2018 4:49:21 PM By: Linton Ham MD Entered By: Linton Ham on 03/02/2018 13:44:39 Kolinski, Alexa [employer] ([id number]) -------------------------------------------------------------------------------- Physical Exam Details Patient Name: Alexa Lowes [name]. Date of Service: 03/02/2018 1:00 PM Medical Record Number: [id number] Patient Account Number: [id number] Date of Birth/Sex: [birth year] (84 y.o. F) Treating RN: Cornell Barman Primary Care Provider: Lucianne Lei Other Clinician: Referring Provider: Lucianne Lei Treating Provider/Extender: Tito Dine in Treatment: 86 Constitutional Patient is hypertensive.. Pulse regular and within target range for patient.Marland Kitchen Respirations regular, non-labored and within target range.. Temperature is normal and within the target range for the patient.Marland Kitchen appears in no distress. Notes wound exam; the wound is on the left anterior tibia. There is no exposed bone fully granulated wound bed there was adherent debris and probably adherent dressing. Using a #5 curet the surface of the wound was debridement to healthy granulation tissue. There is no evidence of surrounding infection Electronic Signature(s) Signed: 03/02/2018 4:49:21 PM By: Linton Ham MD Entered By: Linton Ham on 03/02/2018 13:45:43 [name], Alexa [employer]  ([id number]) -------------------------------------------------------------------------------- Physician Orders Details Patient Name: Alexa Lowes [name]. Date of Service: 03/02/2018 1:00 PM Medical Record Number: [id number] Patient Account Number: [id number] Date of Birth/Sex: [birth year] (84 y.o. F) Treating RN: Cornell Barman Primary Care Provider: Lucianne Lei Other Clinician: Referring Provider: Lucianne Lei Treating Provider/Extender: Tito Dine in Treatment: 32 Verbal / Phone Orders: No Diagnosis Coding Wound Cleansing Wound #1 Left,Anterior Lower Leg o Clean wound with Normal Saline. o Cleanse wound with mild soap and water Anesthetic (add to Medication List) o Topical Lidocaine 4% cream applied to wound bed prior to debridement (In Clinic Only). Primary Wound Dressing Wound #1 Left,Anterior Lower Leg o Hydrafera Blue Ready Transfer Secondary Dressing o ABD and Kerlix/Conform - coban to secure Dressing Change Frequency o Other: - twice weekly Follow-up Appointments o Return Appointment in 1 week. [place] Visits o Home Health Nurse may visit PRN to address patientos wound care needs. o FACE TO FACE ENCOUNTER: MEDICARE and MEDICAID PATIENTS: I certify that this patient is under my care and that I  had a face-to-face encounter that meets the physician face-to-face encounter requirements with this patient on this date. The encounter with the patient was in whole or in part for the following MEDICAL CONDITION: (primary reason for [place]) MEDICAL NECESSITY: I certify, that based on my findings, NURSING services are a medically necessary home health service. HOME BOUND STATUS: I certify that my clinical findings support that this patient is homebound (i.e., Due to illness or injury, pt requires aid of supportive devices such as crutches, cane, wheelchairs, walkers, the use of special transportation or the assistance of  another person to leave their place of residence. There is a normal inability to leave the home and doing so requires considerable and taxing effort. Other absences are for medical reasons / religious services and are infrequent or of short duration when for other reasons). o If current dressing causes regression in wound condition, may D/C ordered dressing product/s and apply Normal Saline Moist Dressing daily until next [address] / Other MD appointment. [place] of regression in wound condition at [phone]. o Please direct any NON-WOUND related issues/requests for orders to patient's Primary Care Physician Electronic Signature(s) Signed: 03/02/2018 4:49:21 PM By: Linton Ham MD Signed: 03/07/2018 9:08:37 AM By: Gretta Cool, BSN, RN, CWS, Kim RN, BSN Entered By: Gretta Cool, BSN, RN, CWS, Kim on 03/02/2018 13:27:16 [name], Alexa [employer] ([id number]) -------------------------------------------------------------------------------- Problem List Details Patient Name: AHSHA, HINSLEY [name]. Date of Service: 03/02/2018 1:00 PM Medical Record Number: [id number] Patient Account Number: [id number] Date of Birth/Sex: February 09, 1926 (84 y.o. 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 ([id number]) -------------------------------------------------------------------------------- Progress Note Details Patient Name: Alexa Lowes [name]. Date of Service: 03/02/2018 1:00 PM Medical Record Number: [id number] Patient Account Number: [id number] Date of Birth/Sex: 1926/02/12 (84 y.o. F) Treating RN: Cornell Barman Primary Care Provider: Lucianne Lei Other Clinician: Referring Provider: Lucianne Lei Treating Provider/Extender: Tito Dine in Treatment: 39 Subjective History of Present Illness (HPI) 84 year old patient was seen in the ED about 10 days ago for a history of abrasion to the left lower extremity while she was boarding a bus and scraped the anterior part of her left leg. She has been a diabetic for many years and has been taking treatment regularly. Her past medical history is also significant for anemia arthritis constipation and hypertension and is status post abdominal hysterectomy. She has never been a smoker. after her ER visit she was advised to apply Bactroban ointment to the wound twice a day and continue to monitor her blood glucose levels. her last hemoglobin A1c was done 3 years ago and was 6.6% 06/04/17 patient left anterior shin wound appears to be doing well although it is still somewhat dry despite the treatment with Medihoney. We have been using  Kerlex following the Medihoney application and I believe this is just not retaining that much moisture. Nonetheless there is no evidence of infection. 06/11/17 on evaluation today patient appears to be doing a little bit worse in regard to her wound. The entirety of the wound is dry and unfortunately the Medihoney does not seem to be helping this. That is even with the dressing changes that I made last week to try to retain  more moisture. She has not tried [employer] as of yet. She was also noncompressible when testing for ABIs. 07/01/2017 -- she had a arterial studies done and was seen by Dr. Lucky Cowboy. her noninvasive studies showed noncompressible vessels on the right but brisk waveforms and digital pressures of 71 on the right consistent with only mild arterial insufficienc. On the left her ABI was 0.47 and this may be falsely elevated due to calcification. No digital pressures were obtained on the left and this is consistent with severe arterial insufficiency. this critical limb threatening situation on the left, would make wound healing difficult and it represents a serious situation and he has recommended an angiography with possible revascularization. The patient desired to defer the decision to she discuss with the family members. 07/09/17 on evaluation today patient appears to be doing fairly well in regard to her left anterior lower extremity wound. She has been tolerating the dressing changes without complication we have been utilizing Santyl at this time. She tells me that she is having no significant pain compared to what she has had in the past. She has decided after discussing with family that she is going to go forward with the surgery with Dr. dew. This is to restore better blood flow to left lower extremity. With that it does appear that her lower extremity wound is making some progress albeit slow. No fevers, chills, nausea, or vomiting noted at this time. 07/16/17 on evaluation today patient's wound appears to be doing roughly about the same although some of the slough is clearing off. [name] she has her appointment with vascular next week on Thursday the 23rd. With that being said her wound does not appear to be hurting as badly as it has in the past which is good news. No fevers, chills, nausea, or vomiting noted at this time. 07/30/2017 -- she had surgery on 07/22/2017 --indications being nonhealing ulcer on  the left leg with a noninvasive study showing marked reduction in ABI of less than 0.5 and no digital pressure on her left leg. she had a percutaneous transluminal angioplasty of the left common and external iliac arteries. She then had a stent placed to the left external iliac arteries and to the left common iliac artery. 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 The flow distally was poor and there were multiple areas of high-grade stenosis or occlusion of the distal SFA and the popliteal artery with only a diseased peroneal artery as runoff distally. She has a postop appointment on September 23 08/20/2017 -- she went back to the vascular office for an appointment on 08/12/2017 and bilateral ABIs were done which were notable for moderate right lower extremity arterial disease and unable to obtain left ankle brachial indices due to absent Doppler velocities in the posterior tibial artery and the anterior tibial artery. Monophasic waveforms to the left femoral and Kliethermes, Bridgette [name]. ([id number]) popliteal artery. this was worrisome for a failure of stents placed and lower extremity angiogram  was recommended. as was done on 08/16/2017 and the stents placed in the left eye Lex system had some mild narrowing at the iliac bifurcation and the leading edge of the stent proximally was poor wall apposition with stenosis in the 50-60% range in the right common iliac artery. There was also occlusion of the common femoral artery and the origin of the SFA and profunda femoris artery. Flow distally was poor but they appeared to be multiple areas of high-grade stenosis or occlusion in the distal SFA and popliteal artery with only a diseased peroneal artery. in another stent was placed and deployed. The femoral occlusion would need to be treated surgically. 08/27/17 on evaluation today patient appears to be doing about the  same in regard to her left for extremity wound. Unfortunately secondary to her vascular status this is not a very well healing wound at this time. She has been tolerating the dressing changes she has some discomfort but mainly with cleansing of the wound not at other times. No fevers, chills, nausea, or vomiting noted at this time. 09/17/2017 -- the patient was recently discharged from the hospital where she was admitted between 09/08/2017 and 09/10/2017, when she had a procedure on her left femoral artery with the endarterectomy and a patch. she will be going back for review this coming week. 09/24/2017 -- was seen on 09/18/2017 in the ER, for bleeding from her wound which was debrided earlier during the day. At the time she was seen there was no active bleeding. A thrombin pad was placed over the area and she was asked to change the outside dressing but come back to the wound center as planned. she was also seen at the [facility] office for a review after her surgery and bilateral lower extremity ABI when compared to the previous ones showed minimal improvement of arterial blood flow. She was set up for a CT angiogram of the abdomen and pelvis and lower extremities to see the degree of peripheral arterial disease 10/28/2017 -- had a x-ray of the left tibia and fibula done on 10/07/2017 -- showed no bony acute abnormality she was reviewed by Dr. Corene Cornea dew on 10/26/2017, and he reviewed her CT angiogram and this demonstrated a widely patent left femoral endarterectomy including the origins of the femoral artery. She also had moderate distal disease in the popliteal artery, distal SFA and tibial disease. Her peroneal artery is a dominant runoff distally.he felt she had adequate perfusion for healing the mid calf level and he is putting off all surgical intervention for now and is going to see her back in 2-3 months for noninvasive studies. She also has a new wound on the  plantar aspect of the left big toe where she pulled a piece of skin and has caused a superficial ulceration 11/19/17 on evaluation today patient appears to be doing decently well at this point in time. It does appear that her Wound VAC has been approved as ordered by Dr. Con Memos however she has not received that as of yet. Nonetheless the wound bed in general appears to have minimal slough noted at this time in a fairly good amount of granulation although there is to be a noted in the center of the wound. No fevers, chills, nausea, or vomiting noted at this time. 12/02/17 on evaluation today patient appears to be doing about the same in regard to her left anterior shin wound. Unfortunately her left toe wound appears to still be doing about the same  as far as the eschar covering. This has softened up a bit to the point that it is now loose and could have selective debridement which would allow for the Santyl to work better. Fortunately she is having no pain. 12/10/17 on evaluation today patient appears to be doing fairly well in regard to her left anterior shin wound and that it at least appears to be stable. With that being said she does have really no significant healing at this point this wound seems to be mainly just maintaining. Subsequently patient also has the left toe wound which does not seem to be progressing. There is still nonviable tissue overlying the surface of the wound and this doesn't even seem to be loosening up very well at all. And patient has been using the Santyl. I do think this may need selective debridement today. Fortunately there does not appear to be any evidence of infection although I am concerned about the possibility that patient's blood flow is not likely sufficient to be able to tolerate appropriate wound healing. This was discussed with patient today as well although obviously I would leave the determination up to vascular surgery. She will be seeing Dr. dew on  February 5 for repeat vascular studies and then in office evaluation. 12/17/17 on evaluation today patient appears to be doing about the same in regard to her left shin and left great toe ulcers. She has been performing the dressing changes as previously recommended over the past week. With that being said unfortunately nothing seems to be changing much in regard to the dimensions of the wound and I do feel again that this may be related to vascular flow. Patient's daughter was present during the evaluation today and we did discuss this as well. I will be detailed in greater detail in the plan. [name], Alexa ([id number]) 12/23/17 she is here in follow up evaluation of a left great toe and left anterior leg ulcer. There is noted ischemic changes (urple, dry gangrene, shriveling effect laterally) to the left great toe, although not overtly cold. Contact was made to Dr Ozella Almond office regarding possibility of seeing her in the office today or tomorrow as this is an acute change; they recommended she go to the emergency department for evaluation by Dr Ozella Almond partner Dr Curley Spice. The ER was contacted, spoke with Tiffany, regarding this. Patient was accompanied by her son. The left anterior shin ulcer is improved in measurement, overtly exposed bone with area of discoloration/necrosis. will follow up after hospitalization/vascular intervention 12/31/17 on evaluation today patient is status post having gone to the ER last week on 12/23/17. 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 This was performed by Dr. Cleda Mccreedy on 12/23/17. Subsequently Dr. Corene Cornea do with [facility] performed percutaneous evaluation and intervention of patient's left lower extremity on 12/27/17. Angioplasty was performed following angiogram of the left peroneal artery and tibial angioplasty was also performed of the left popliteal artery. Angioplasty of the mid and distal superficial femoral  arteries was likewise undertaken. Stents were placed in the left above knee popliteal artery following angioplasty and then a second stent placed in the mid- SFA secondary to 50% residual stenosis following the angioplasty. Fortunately patient was much better post procedure as far as stenosis/occlusions were concerned and she did have definitely much better blood flow which was noted to be evident on evaluation today. 01/07/18 on evaluation today patient appears to be doing well on her left lower extremity  in regard to the anterior shin ulcer. She has been tolerating the dressing changes without complication. The good news is nothing appears to be doing worse and in general she does seem to have more granulation tissue. There is still bone exposure. Fortunately she's been tolerating the dressing changes. Patient's a the eyes that Dr. Bunnie Domino office on 01/04/18 revealed a right ABI of 0.69 and a left ABI of 0.81 obviously this is doing very well and significantly improved. 01/12/18; patient with a traumatic wound on the left anterior tibial area. Fairly substantial wound was still open tibia however the amount of exposed bone is apparently a lot better. We have applied for TheraSkins but have not yet received approval. She has been using silver collagen as mentioned everything appears better. She had recent revascularization by [facility] and had a left ABI of 0.81. 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 This is still wrapped and I did not look at this today, apparently we are not following secondary to this being a recent surgical wound. 01/19/18; patient with a traumatic wound on the left anterior tibial area. Only a small amount of open tibia still remains. We applied Theraskin #1. She has an another wound at the right great toe amputation site. This is being followed by her surgeon on a 49 day global. Our nurse inadvertently remove the wet to dry dressing and  reported there is exposed bone. We are not following this as of yet 02/02/18; she is back for a deep traumatic wound over the left anterior tibia. We applied the first TheraSkin 2 weeks ago. She returns today in follow-up. The wound is better there is no exposed bone. She has home health going out they will change the superficial dressings on this in a week there also dealing with a right great toe amputation site, we are not following this as of yet 02/16/18-she is here in follow up evaluation for left lower extremity wound. Theraskin was reapplied. She saw Dr Cleda Mccreedy today who redressed her amputation site 03/02/18; patient is here for follow-up of her left lower extremity wound. We applied TheraSkin 2 weeks ago. She arrived with the wound bed completely granulated. This required debridement. Dimensions are better healthier looking wound. Nevertheless I didn't think a subsequent TheraSkin was necessary Objective Constitutional Patient is hypertensive.. Pulse regular and within target range for patient.Marland Kitchen Respirations regular, non-labored and within target range.. Temperature is normal and within the target range for the patient.Marland Kitchen appears in no distress. Vitals Time Taken: 12:49 PM, Height: 65 in, Weight: 131 lbs, BMI: 21.8, Temperature: 97.9 F, Pulse: 89 bpm, Respiratory [name], [name]. ([id number]) Rate: 18 breaths/min, Blood Pressure: 146/61 mmHg. General Notes: wound exam; the wound is on the left anterior tibia. There is no exposed bone fully granulated wound bed there was adherent debris and probably adherent dressing. Using a #5 curet the surface of the wound was debridement to healthy granulation tissue. There is no evidence of surrounding infection Integumentary (Hair, Skin) Wound #1 status is Open. Original cause of wound was Trauma. The wound is located on the Left,Anterior Lower Leg. The wound measures 4cm length x 1.8cm width x 0.2cm depth; 5.655cm^2 area and 1.131cm^3 volume.  There is muscle, Fat Layer (Subcutaneous Tissue) Exposed, and fascia exposed. There is no tunneling or undermining noted. There is a large amount of serosanguineous drainage noted. The wound margin is flat and intact. There is medium (34-66%) red, pink, hyper - granulation within the  wound bed. There is a medium (34-66%) amount of necrotic tissue within the wound bed including Eschar and Adherent Slough. The periwound skin appearance exhibited: Scarring, Maceration, Hemosiderin Staining. The periwound skin appearance did not exhibit: Callus, Crepitus, Excoriation, Induration, Rash, Dry/Scaly, Atrophie Blanche, Cyanosis, Ecchymosis, Mottled, Pallor, Rubor, Erythema. Periwound temperature was noted as No Abnormality. The periwound has tenderness on palpation. 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 There was a Excisional Skin/Subcutaneous Tissue Debridement with a total area of 7.2 sq cm performed by [name], MD. With the following instrument(s): Curette. to remove Viable and Non-Viable tissue/material Material removed includes Subcutaneous Tissue after achieving pain control using Other (hurricaine spray). A time out was conducted at 13:18, prior to the start of the procedure. A Moderate amount of bleeding was controlled with Silver Nitrate. The procedure was tolerated well with a pain  level of 0 throughout and a pain level of 0 following the procedure. Post Debridement Measurements: 4cm length x 1.8cm width x 0.2cm depth; 1.131cm^3 volume. Character of Wound/Ulcer Post Debridement is stable. Severity of Tissue Post Debridement is: Fat layer exposed. Post procedure Diagnosis Wound #1: Same as Pre-Procedure Plan Lamping, [name]. ([id number]) Wound Cleansing: Wound #1 Left,Anterior Lower Leg: Clean wound with Normal Saline. Cleanse wound with mild soap and water Anesthetic (add to Medication List): Topical Lidocaine 4% cream applied to wound bed prior to debridement (In Clinic Only). Primary Wound Dressing: Wound #1 Left,Anterior Lower Leg: Hydrafera Blue Ready Transfer Secondary Dressing: ABD and Kerlix/Conform - coban to secure Dressing Change Frequency: Other: - twice weekly Follow-up Appointments: Return Appointment in 1 week. Home Health: [place] Nurse may visit PRN to address patient s wound care needs. FACE TO FACE ENCOUNTER: MEDICARE and MEDICAID PATIENTS: I certify that this patient is under my care and that I had a face-to-face encounter that meets the physician face-to-face encounter requirements with this patient on this date. The encounter with the patient was in whole or in part for the following MEDICAL CONDITION: (primary reason for [place]) MEDICAL NECESSITY: I certify, that based on my findings, NURSING services are a medically necessary home health service. HOME BOUND STATUS: I certify that my clinical findings support that this patient is homebound (i.e., Due to illness or injury, pt requires aid of supportive devices such as crutches, cane, wheelchairs, walkers, the use of special transportation or the assistance of another person to leave their place of residence. There is a normal inability to leave the home and doing so requires considerable and taxing effort. Other absences are for medical reasons /  religious services and are infrequent or of short duration when for other reasons). If current dressing causes regression in wound condition, may D/C ordered dressing product/s and apply Normal Saline Moist Dressing daily until next [place] / Other MD appointment. [place] of regression in wound condition at [phone]. Please direct any  NON-WOUND related issues/requests for orders to patient's Primary Care Physician #1 we did not apply ThreaSkin #3 and's change the Hydrofera Blue under compression we will have home health change this once #2 follow-up in one week Electronic Signature(s) Signed: 03/02/2018 4:49:21 PM By: Linton Ham MD Entered By: Linton Ham on 03/02/2018 13:47:10 [name], [name]. ([id number]) -------------------------------------------------------------------------------- SuperBill Details Patient Name: Alexa Lowes [name]. Date of Service: 03/02/2018 Medical Record Number: [id number] Patient Account Number: [id number] Date of Birth/Sex: July 09, 1926 (84 y.o. 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 I70.245 Atherosclerosis of native arteries of left leg with ulceration of other part of foot Facility Procedures CPT4 Code Description: 76151834 11042 - DEB SUBQ TISSUE 20 SQ CM/< ICD-10 Diagnosis Description L97.824 Non-pressure chronic ulcer of other part of left lower leg with ne Modifier: crosis of bo Quantity: 1 ne Physician  Procedures CPT4 Code Description: [id number] 11042 - WC PHYS SUBQ TISS 20 SQ CM ICD-10 Diagnosis Description L97.824 Non-pressure chronic ulcer of other part of left lower leg with ne Modifier: crosis of bon Quantity: 1 e Electronic Signature(s) Signed: 03/02/2018 4:49:21 PM By: Linton Ham MD Entered By: Linton Ham on 03/02/2018 13:47:41

## 2018-03-09 ENCOUNTER — Encounter: Payer: Medicare Other | Admitting: Internal Medicine

## 2018-03-09 DIAGNOSIS — E11622 Type 2 diabetes mellitus with other skin ulcer: Secondary | ICD-10-CM | POA: Diagnosis not present

## 2018-03-11 NOTE — Progress Notes (Signed)
[name], [name] ([id number]) Visit Report for 03/09/2018 HPI Details Patient Name: [name], [name] P. Date of Service: 03/09/2018 1:45 PM Medical Record Number: [id number] Patient Account Number: [id number] Date of Birth/Sex: [birth year] (84 y.o. F) Treating RN: [name] Primary Care Provider: Lucianne Lei Other Clinician: Referring Provider: Lucianne Lei Treating Provider/Extender: Tito Dine in Treatment: 28 History of Present Illness HPI Description: 84 year old patient was seen in the ED about 10 days ago for a history of abrasion to the left lower extremity while she was boarding a bus and scraped the anterior part of her left leg. She has been a diabetic for many years and has been taking treatment regularly. Her past medical history is also significant for anemia arthritis constipation and hypertension and is status post abdominal hysterectomy. She has never been a smoker. after her ER visit she was advised to apply Bactroban ointment to the wound twice a day and continue to monitor her blood glucose levels. her last hemoglobin A1c was done 3 years ago and was 6.6% 06/04/17 patient left anterior shin wound appears to be doing well although it is still somewhat dry despite the treatment with Medihoney. We have been using Kerlex following the Medihoney application and I believe this is just not retaining that much moisture. Nonetheless there is no evidence of infection. 06/11/17 on evaluation today patient appears to be doing a little bit worse in regard to her wound. The entirety of the wound is dry and unfortunately the Medihoney does not seem to be helping this. That is even with the dressing changes that I made last week to try to retain more moisture. She has not tried [employer] as of yet. She was also noncompressible when testing for ABIs. 07/01/2017 -- she had a arterial studies done and was seen by Dr. Lucky Cowboy. her noninvasive studies showed noncompressible vessels on  the right but brisk waveforms and digital pressures of 71 on the right consistent with only mild arterial insufficienc. On the left her ABI was 0.47 and this may be falsely elevated due to calcification. No digital pressures were obtained on the left and this is consistent with severe arterial insufficiency. this critical limb threatening situation on the left, would make wound healing difficult and it represents a serious situation and he has recommended an angiography with possible revascularization. The patient desired to defer the decision to she discuss with the family members. 07/09/17 on evaluation today patient appears to be doing fairly well in regard to her left anterior lower extremity wound. She has been tolerating the dressing changes without complication we have been utilizing Santyl at this time. She tells me that she is having no significant pain compared to what she has had in the past. She has decided after discussing with family that she is going to go forward with the surgery with Dr. dew. This is to restore better blood flow to left lower extremity. With that it does appear that her lower extremity wound is making some progress albeit slow. No fevers, chills, nausea, or vomiting noted at this time. 07/16/17 on evaluation today patient's wound appears to be doing roughly about the same although some of the slough is clearing off. [name] she has her appointment with vascular next week on Thursday the 23rd. With that being said her wound does not appear to be hurting as badly as it has in the past which is good news. No fevers, chills, nausea, or vomiting noted at this time. 07/30/2017 -- she had surgery on  07/22/2017 --indications being nonhealing ulcer on the left leg with a noninvasive study showing marked reduction in ABI of less than 0.5 and no digital pressure on her left leg. she had a percutaneous transluminal angioplasty of the left common and external iliac arteries. She then  had a stent placed to the left external iliac arteries and to the left common iliac artery. 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 The flow distally was poor and there were multiple areas of high-grade stenosis or occlusion of the distal SFA and the popliteal artery with only a diseased peroneal artery as runoff distally. She has a postop appointment on September 23 08/20/2017 -- she went back to the vascular office for an appointment on 08/12/2017 and bilateral ABIs were done which Ragone, Denicia P. ([id number]) were notable for moderate right lower extremity arterial disease and unable to obtain left ankle brachial indices due to absent Doppler velocities in the posterior tibial artery and the anterior tibial artery. Monophasic waveforms to the left femoral and popliteal artery. this was worrisome for a failure of stents placed and lower extremity angiogram was recommended. as was done on 08/16/2017 and the stents placed in the left eye Lex system had some mild narrowing at the iliac bifurcation and the leading edge of the stent proximally was poor wall apposition with stenosis in the 50-60% range in the right common iliac artery. There was also occlusion of the common femoral artery and the origin of the SFA and profunda femoris artery. Flow distally was poor but they appeared to be multiple areas of high-grade stenosis or occlusion in the distal SFA and popliteal artery with only a diseased peroneal artery. in another stent was placed and deployed. The femoral occlusion would need to be treated surgically. 08/27/17 on evaluation today patient appears to be doing about the same in regard to her left for extremity wound. Unfortunately secondary to her vascular status this is not a very well healing wound at this time. She has been tolerating the dressing changes she has some discomfort but mainly with  cleansing of the wound not at other times. No fevers, chills, nausea, or vomiting noted at this time. 09/17/2017 -- the patient was recently discharged from the hospital where she was admitted between 09/08/2017 and 09/10/2017, when she had a procedure on her left femoral artery with the endarterectomy and a patch. she will be going back for review this coming week. 09/24/2017 -- was seen on 09/18/2017 in the ER, for bleeding from her wound which was debrided earlier during the day. At the time she was seen there was no active bleeding. A thrombin pad was placed over the area and she was asked to change the outside dressing but come back to the wound center as planned. she was also seen at the [facility] office for a review after her surgery and bilateral lower extremity ABI when compared to the previous ones showed minimal improvement of arterial blood flow. She was set up for a CT angiogram of the abdomen and pelvis and lower extremities to see the degree of peripheral arterial disease 10/28/2017 -- had a x-ray of the left tibia and fibula done on 10/07/2017 -- showed no bony acute abnormality she was reviewed by Dr. Corene Cornea dew on 10/26/2017, and he reviewed her CT angiogram and this demonstrated a widely patent left femoral endarterectomy including the origins of the femoral artery.  She also had moderate distal disease in the popliteal artery, distal SFA and tibial disease. Her peroneal artery is a dominant runoff distally.he felt she had adequate perfusion for healing the mid calf level and he is putting off all surgical intervention for now and is going to see her back in 2-3 months for noninvasive studies. She also has a new wound on the plantar aspect of the left big toe where she pulled a piece of skin and has caused a superficial ulceration 11/19/17 on evaluation today patient appears to be doing decently well at this point in time. It does appear that her  Wound VAC has been approved as ordered by Dr. Con Memos however she has not received that as of yet. Nonetheless the wound bed in general appears to have minimal slough noted at this time in a fairly good amount of granulation although there is to be a noted in the center of the wound. No fevers, chills, nausea, or vomiting noted at this time. 12/02/17 on evaluation today patient appears to be doing about the same in regard to her left anterior shin wound. Unfortunately her left toe wound appears to still be doing about the same as far as the eschar covering. This has softened up a bit to the point that it is now loose and could have selective debridement which would allow for the Santyl to work better. Fortunately she is having no pain. 12/10/17 on evaluation today patient appears to be doing fairly well in regard to her left anterior shin wound and that it at least appears to be stable. With that being said she does have really no significant healing at this point this wound seems to be mainly just maintaining. Subsequently patient also has the left toe wound which does not seem to be progressing. There is still nonviable tissue overlying the surface of the wound and this doesn't even seem to be loosening up very well at all. And patient has been using the Santyl. I do think this may need selective debridement today. Fortunately there does not appear to be any evidence of infection although I am concerned about the possibility that patient's blood flow is not likely sufficient to be able to tolerate appropriate wound healing. This was discussed with patient today as well although obviously I would leave the determination up to vascular surgery. She will be seeing Dr. dew on February 5 for repeat vascular studies and then in office evaluation. 12/17/17 on evaluation today patient appears to be doing about the same in regard to her left shin and left great toe ulcers. She has been performing the dressing  changes as previously recommended over the past week. With that being said unfortunately nothing seems to be changing much in regard to the dimensions of the wound and I do feel again that this may [name], [name] ([id number]) be related to vascular flow. Patient's daughter was present during the evaluation today and we did discuss this as well. I will be detailed in greater detail in the plan. 12/23/17 she is here in follow up evaluation of a left great toe and left anterior leg ulcer. There is noted ischemic changes (urple, dry gangrene, shriveling effect laterally) to the left great toe, although not overtly cold. Contact was made to Dr Ozella Almond office regarding possibility of seeing her in the office today or tomorrow as this is an acute change; they recommended she go to the emergency department for evaluation by Dr Ozella Almond partner Dr Curley Spice. The ER was  contacted, spoke with Tiffany, regarding this. Patient was accompanied by her son. The left anterior shin ulcer is improved in measurement, overtly exposed bone with area of discoloration/necrosis. will follow up after hospitalization/vascular intervention 12/31/17 on evaluation today patient is status post having gone to the ER last week on 12/23/17. 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 This was performed by Dr. Cleda Mccreedy on 12/23/17. Subsequently Dr. Corene Cornea do with [facility] performed percutaneous evaluation and intervention of patient's left lower extremity on 12/27/17. Angioplasty was performed following angiogram of the left peroneal artery and tibial angioplasty was also performed of the left popliteal artery. Angioplasty of the mid and distal superficial femoral arteries was likewise undertaken. Stents were placed in the left above knee popliteal artery following angioplasty and then a second stent placed in the mid- SFA secondary to 50% residual stenosis following the angioplasty. Fortunately  patient was much better post procedure as far as stenosis/occlusions were concerned and she did have definitely much better blood flow which was noted to be evident on evaluation today. 01/07/18 on evaluation today patient appears to be doing well on her left lower extremity in regard to the anterior shin ulcer. She has been tolerating the dressing changes without complication. The good news is nothing appears to be doing worse and in general she does seem to have more granulation tissue. There is still bone exposure. Fortunately she's been tolerating the dressing changes. Patient's a the eyes that Dr. Bunnie Domino office on 01/04/18 revealed a right ABI of 0.69 and a left ABI of 0.81 obviously this is doing very well and significantly improved. 01/12/18; patient with a traumatic wound on the left anterior tibial area. Fairly substantial wound was still open tibia however the amount of exposed bone is apparently a lot better. We have applied for TheraSkins but have not yet received approval. She has been using silver collagen as mentioned everything appears better. She had recent revascularization by [facility] and had a left ABI of 0.81. 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 This is still wrapped and I did not look at this today, apparently we are not following secondary to this being a recent surgical wound. 01/19/18; patient with a traumatic wound on the left anterior tibial area. Only a small amount of open tibia still remains. We applied Theraskin #1. She has an another wound at the right great toe amputation site. This is being followed by her surgeon on a 109 day global. Our nurse inadvertently remove the wet to dry dressing and reported there is exposed bone. We are not following this as of yet 02/02/18; she is back for a deep traumatic wound over the left anterior tibia. We applied the first TheraSkin 2 weeks ago. She returns today in follow-up. The wound is better  there is no exposed bone. She has home health going out they will change the superficial dressings on this in a week there also dealing with a right great toe amputation site, we are not following this as of yet 02/16/18-she is here in follow up evaluation for left lower extremity wound. Theraskin was reapplied. She saw Dr Cleda Mccreedy today who redressed her amputation site 03/02/18; patient is here for follow-up of her left lower extremity wound. We applied TheraSkin 2 weeks ago. She arrived with the wound bed completely granulated. This required debridement. Dimensions are better healthier looking wound. Nevertheless I didn't think a subsequent TheraSkin  was necessary 03/09/18; traumatic wound on the left lower extremity with initially exposed tibia. We closed this over with TheraSkin o2. We've now used Hydrofera Blue starting last week.the daughter was present is doing most of dressing changes. She seems to want to discharge home health although she has an appointment next week with regards to her orthopedic issues in her left foot so they're going to keep home health to see if therapy is required. There is apparently an open area on the left foot as well we have not seen this and haven't been following at Electronic Signature(s) Signed: 03/09/2018 5:02:04 PM By: Linton Ham MD Entered By: Linton Ham on 03/09/2018 14:51:18 Artley, [name] ([id number]) -------------------------------------------------------------------------------- Physical Exam Details Patient Name: [name] P. Date of Service: 03/09/2018 1:45 PM Medical Record Number: [id number] Patient Account Number: [id number] Date of Birth/Sex: September 02, 1926 (84 y.o. F) Treating RN: [name] Primary Care Provider: Lucianne Lei Other Clinician: Referring Provider: Lucianne Lei Treating Provider/Extender: Tito Dine in Treatment: 40 Constitutional Sitting or standing Blood Pressure is within target range for  patient.. Pulse regular and within target range for patient.Marland Kitchen Respirations regular, non-labored and within target range.. Temperature is normal and within the target range for the patient.Marland Kitchen appears in no distress. Respiratory Respiratory effort is easy and symmetric bilaterally. Rate is normal at rest and on room air.. Cardiovascular Pedal pulses palpable and strong bilaterally.Marland Kitchen edema is well controlled. Integumentary (Hair, Skin) no systemic skin issues are seen. Psychiatric No evidence of depression, anxiety, or agitation. Calm, cooperative, and communicative. Appropriate interactions and affect.. Notes wound exam; the area on the left anterior tibia. There is no exposed bone dimensions are smaller. No evidence of surrounding infection the wound bed is healthy granulation Electronic Signature(s) Signed: 03/09/2018 5:02:04 PM By: Linton Ham MD Entered By: Linton Ham on 03/09/2018 14:52:37 [name], [name] ([id number]) -------------------------------------------------------------------------------- Physician Orders Details Patient Name: [name] P. Date of Service: 03/09/2018 1:45 PM Medical Record Number: [id number] Patient Account Number: [id number] Date of Birth/Sex: 1926/10/13 (84 y.o. F) Treating RN: Montey Hora Primary Care Provider: Lucianne Lei Other Clinician: Referring Provider: Lucianne Lei Treating Provider/Extender: Tito Dine in Treatment: 32 Verbal / Phone Orders: No Diagnosis Coding Wound Cleansing Wound #1 Left,Anterior Lower Leg o Clean wound with Normal Saline. o Cleanse wound with mild soap and water Anesthetic (add to Medication List) o Topical Lidocaine 4% cream applied to wound bed prior to debridement (In Clinic Only). Primary Wound Dressing Wound #1 Left,Anterior Lower Leg o Hydrafera Blue Ready Transfer Secondary Dressing o ABD and Kerlix/Conform - coban to secure Dressing Change Frequency o Other: -  twice weekly Follow-up Appointments o Return Appointment in 1 week. [place] Visits o Home Health Nurse may visit PRN to address patientos wound care needs. o FACE TO FACE ENCOUNTER: MEDICARE and MEDICAID PATIENTS: I certify that this patient is under my care and that I had a face-to-face encounter that meets the physician face-to-face encounter requirements with this patient on this date. The encounter with the patient was in whole or in part for the following MEDICAL CONDITION: (primary reason for [place]) MEDICAL NECESSITY: I certify, that based on my findings, NURSING services are a medically necessary home health service. HOME BOUND STATUS: I certify that my clinical findings support that this patient is homebound (i.e., Due to illness or injury, pt requires aid of supportive devices such as crutches, cane, wheelchairs, walkers, the use of special transportation or the assistance of  another person to leave their place of residence. There is a normal inability to leave the home and doing so requires considerable and taxing effort. Other absences are for medical reasons / religious services and are infrequent or of short duration when for other reasons). o If current dressing causes regression in wound condition, may D/C ordered dressing product/s and apply Normal Saline Moist Dressing daily until next [place] / Other MD appointment. [place] of regression in wound condition at [phone]. o Please direct any NON-WOUND related issues/requests for orders to patient's Primary Care Physician Electronic Signature(s) Signed: 03/09/2018 5:02:04 PM By: Linton Ham MD Signed: 03/09/2018 5:03:40 PM By: Montey Hora Entered By: Montey Hora on 03/09/2018 14:28:20 [name], [name] ([id number]) -------------------------------------------------------------------------------- Problem List Details Patient Name:  [name] P. Date of Service: 03/09/2018 1:45 PM Medical Record Number: [id number] Patient Account Number: [id number] Date of Birth/Sex: [birth year] (84 y.o. 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 ([id number]) -------------------------------------------------------------------------------- Progress Note Details Patient Name: [name] P. Date of Service: 03/09/2018 1:45 PM Medical Record Number: [id number] Patient Account Number: [id number] Date of Birth/Sex: [birth year] (84 y.o. F) Treating RN: [name] Primary Care Provider: Lucianne Lei Other Clinician: Referring Provider: Lucianne Lei Treating Provider/Extender: Tito Dine in Treatment: 40 Subjective History of Present Illness (HPI) 84 year old patient was seen in the ED about 10 days ago for a history of abrasion to the left lower extremity while she was boarding a bus and scraped the anterior part of her left leg. She has been a diabetic for many years and has been taking treatment regularly. Her past medical history is  also significant for anemia arthritis constipation and hypertension and is status post abdominal hysterectomy. She has never been a smoker. after her ER visit she was advised to apply Bactroban ointment to the wound twice a day and continue to monitor her blood glucose levels. her last hemoglobin A1c was done 3 years ago and was 6.6% 06/04/17 patient left anterior shin wound appears to be doing well although it is still somewhat dry despite the treatment with Medihoney. We have been using Kerlex following the Medihoney application and I believe this is just not retaining that much moisture. Nonetheless there is no evidence of infection. 06/11/17 on evaluation today patient appears to be doing a little bit worse in regard to her wound. The entirety of the wound is dry and unfortunately the Medihoney does not seem to be helping this. That is even with the dressing changes that I made last week to try to retain more moisture. She has not tried [employer] as of yet. She was also noncompressible when testing for ABIs. 07/01/2017 -- she had a arterial studies done and was seen by Dr.  Dew. her noninvasive studies showed noncompressible vessels on the right but brisk waveforms and digital pressures of 71 on the right consistent with only mild arterial insufficienc. On the left her ABI was 0.47 and this may be falsely elevated due to calcification. No digital pressures were obtained on the left and this is consistent with severe arterial insufficiency. this critical limb threatening situation on the left, would make wound healing difficult and it represents a serious situation and he has recommended an angiography with possible revascularization. The patient desired to defer the decision to she discuss with the family members. 07/09/17 on evaluation today patient appears to be doing fairly well in regard to her left anterior lower extremity wound. She has been tolerating the dressing changes without complication  we have been utilizing Santyl at this time. She tells me that she is having no significant pain compared to what she has had in the past. She has decided after discussing with family that she is going to go forward with the surgery with Dr. dew. This is to restore better blood flow to left lower extremity. With that it does appear that her lower extremity wound is making some progress albeit slow. No fevers, chills, nausea, or vomiting noted at this time. 07/16/17 on evaluation today patient's wound appears to be doing roughly about the same although some of the slough is clearing off. [name] she has her appointment with vascular next week on Thursday the 23rd. With that being said her wound does not appear to be hurting as badly as it has in the past which is good news. No fevers, chills, nausea, or vomiting noted at this time. 07/30/2017 -- she had surgery on 07/22/2017 --indications being nonhealing ulcer on the left leg with a noninvasive study showing marked reduction in ABI of less than 0.5 and no digital pressure on her left leg. she had a percutaneous transluminal angioplasty of the left common and external iliac arteries. She then had a stent placed to the left external iliac arteries and to the left common iliac artery. 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 The flow distally was poor and there were multiple areas of high-grade stenosis or occlusion of the distal SFA and the popliteal artery with only a diseased peroneal artery as runoff distally. She has a postop appointment on September 23 08/20/2017 -- she went back to the vascular office for an appointment on 08/12/2017 and bilateral ABIs were done which were notable for moderate right lower extremity arterial disease and unable to obtain left ankle brachial indices due to absent Doppler velocities in the posterior tibial artery and the anterior  tibial artery. Monophasic waveforms to the left femoral and [name], [name] ([id number]) popliteal artery. this was worrisome for a failure of stents placed and lower extremity angiogram was recommended. as was done on 08/16/2017 and the stents placed in the left eye Lex system had some mild narrowing at the iliac bifurcation and the leading edge of the stent proximally was poor wall apposition with stenosis in the 50-60% range in the right common iliac artery. There was also occlusion of the common femoral artery and the origin of the SFA and profunda femoris artery. Flow distally was poor but they appeared to be multiple areas of high-grade stenosis or occlusion in the distal SFA and popliteal artery with only a diseased peroneal artery. in another stent was placed and deployed. The femoral  occlusion would need to be treated surgically. 08/27/17 on evaluation today patient appears to be doing about the same in regard to her left for extremity wound. Unfortunately secondary to her vascular status this is not a very well healing wound at this time. She has been tolerating the dressing changes she has some discomfort but mainly with cleansing of the wound not at other times. No fevers, chills, nausea, or vomiting noted at this time. 09/17/2017 -- the patient was recently discharged from the hospital where she was admitted between 09/08/2017 and 09/10/2017, when she had a procedure on her left femoral artery with the endarterectomy and a patch. she will be going back for review this coming week. 09/24/2017 -- was seen on 09/18/2017 in the ER, for bleeding from her wound which was debrided earlier during the day. At the time she was seen there was no active bleeding. A thrombin pad was placed over the area and she was asked to change the outside dressing but come back to the wound center as planned. she was also seen at the [facility] office for a review after her surgery  and bilateral lower extremity ABI when compared to the previous ones showed minimal improvement of arterial blood flow. She was set up for a CT angiogram of the abdomen and pelvis and lower extremities to see the degree of peripheral arterial disease 10/28/2017 -- had a x-ray of the left tibia and fibula done on 10/07/2017 -- showed no bony acute abnormality she was reviewed by Dr. Corene Cornea dew on 10/26/2017, and he reviewed her CT angiogram and this demonstrated a widely patent left femoral endarterectomy including the origins of the femoral artery. She also had moderate distal disease in the popliteal artery, distal SFA and tibial disease. Her peroneal artery is a dominant runoff distally.he felt she had adequate perfusion for healing the mid calf level and he is putting off all surgical intervention for now and is going to see her back in 2-3 months for noninvasive studies. She also has a new wound on the plantar aspect of the left big toe where she pulled a piece of skin and has caused a superficial ulceration 11/19/17 on evaluation today patient appears to be doing decently well at this point in time. It does appear that her Wound VAC has been approved as ordered by Dr. Con Memos however she has not received that as of yet. Nonetheless the wound bed in general appears to have minimal slough noted at this time in a fairly good amount of granulation although there is to be a noted in the center of the wound. No fevers, chills, nausea, or vomiting noted at this time. 12/02/17 on evaluation today patient appears to be doing about the same in regard to her left anterior shin wound. Unfortunately her left toe wound appears to still be doing about the same as far as the eschar covering. This has softened up a bit to the point that it is now loose and could have selective debridement which would allow for the Santyl to work better. Fortunately she is having no pain. 12/10/17 on evaluation today patient  appears to be doing fairly well in regard to her left anterior shin wound and that it at least appears to be stable. With that being said she does have really no significant healing at this point this wound seems to be mainly just maintaining. Subsequently patient also has the left toe wound which does not seem to be progressing. There  is still nonviable tissue overlying the surface of the wound and this doesn't even seem to be loosening up very well at all. And patient has been using the Santyl. I do think this may need selective debridement today. Fortunately there does not appear to be any evidence of infection although I am concerned about the possibility that patient's blood flow is not likely sufficient to be able to tolerate appropriate wound healing. This was discussed with patient today as well although obviously I would leave the determination up to vascular surgery. She will be seeing Dr. dew on February 5 for repeat vascular studies and then in office evaluation. 12/17/17 on evaluation today patient appears to be doing about the same in regard to her left shin and left great toe ulcers. She has been performing the dressing changes as previously recommended over the past week. With that being said unfortunately nothing seems to be changing much in regard to the dimensions of the wound and I do feel again that this may be related to vascular flow. Patient's daughter was present during the evaluation today and we did discuss this as well. I will be detailed in greater detail in the plan. HADLIE, GIPSON ([id number]) 12/23/17 she is here in follow up evaluation of a left great toe and left anterior leg ulcer. There is noted ischemic changes (urple, dry gangrene, shriveling effect laterally) to the left great toe, although not overtly cold. Contact was made to Dr Ozella Almond office regarding possibility of seeing her in the office today or tomorrow as this is an acute change; they recommended she  go to the emergency department for evaluation by Dr Ozella Almond partner Dr Curley Spice. The ER was contacted, spoke with Tiffany, regarding this. Patient was accompanied by her son. The left anterior shin ulcer is improved in measurement, overtly exposed bone with area of discoloration/necrosis. will follow up after hospitalization/vascular intervention 12/31/17 on evaluation today patient is status post having gone to the ER last week on 12/23/17. 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 This was performed by Dr. Cleda Mccreedy on 12/23/17. Subsequently Dr. Corene Cornea do with [facility] performed percutaneous evaluation and intervention of patient's left lower extremity on 12/27/17. Angioplasty was performed following angiogram of the left peroneal artery and tibial angioplasty was also performed of the left popliteal artery. Angioplasty of the mid and distal superficial femoral arteries was likewise undertaken. Stents were placed in the left above knee popliteal artery following angioplasty and then a second stent placed in the mid- SFA secondary to 50% residual stenosis following the angioplasty. Fortunately patient was much better post procedure as far as stenosis/occlusions were concerned and she did have definitely much better blood flow which was noted to be evident on evaluation today. 01/07/18 on evaluation today patient appears to be doing well on her left lower extremity in regard to the anterior shin ulcer. She has been tolerating the dressing changes without complication. The good news is nothing appears to be doing worse and in general she does seem to have more granulation tissue. There is still bone exposure. Fortunately she's been tolerating the dressing changes. Patient's a the eyes that Dr. Bunnie Domino office on 01/04/18 revealed a right ABI of 0.69 and a left ABI of 0.81 obviously this is doing very well and significantly improved. 01/12/18; patient with a traumatic  wound on the left anterior tibial area. Fairly substantial wound was still open tibia however the amount of exposed bone is  apparently a lot better. We have applied for TheraSkins but have not yet received approval. She has been using silver collagen as mentioned everything appears better. She had recent revascularization by [facility] and had a left ABI of 0.81. 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 This is still wrapped and I did not look at this today, apparently we are not following secondary to this being a recent surgical wound. 01/19/18; patient with a traumatic wound on the left anterior tibial area. Only a small amount of open tibia still remains. We applied Theraskin #1. She has an another wound at the right great toe amputation site. This is being followed by her surgeon on a 70 day global. Our nurse inadvertently remove the wet to dry dressing and reported there is exposed bone. We are not following this as of yet 02/02/18; she is back for a deep traumatic wound over the left anterior tibia. We applied the first TheraSkin 2 weeks ago. She returns today in follow-up. The wound is better there is no exposed bone. She has home health going out they will change the superficial dressings on this in a week there also dealing with a right great toe amputation site, we are not following this as of yet 02/16/18-she is here in follow up evaluation for left lower extremity wound. Theraskin was reapplied. She saw Dr Cleda Mccreedy today who redressed her amputation site 03/02/18; patient is here for follow-up of her left lower extremity wound. We applied TheraSkin 2 weeks ago. She arrived with the wound bed completely granulated. This required debridement. Dimensions are better healthier looking wound. Nevertheless I didn't think a subsequent TheraSkin was necessary 03/09/18; traumatic wound on the left lower extremity with initially exposed tibia. We closed this over with  TheraSkin o2. We've now used Hydrofera Blue starting last week.the daughter was present is doing most of dressing changes. She seems to want to discharge home health although she has an appointment next week with regards to her orthopedic issues in her left foot so they're going to keep home health to see if therapy is required. There is apparently an open area on the left foot as well we have not seen this and haven't been following at [employer] Montufar, Alida P. ([id number]) Constitutional Sitting or standing Blood Pressure is within target range for patient.. Pulse regular and within target range for patient.Marland Kitchen Respirations regular, non-labored and within target range.. Temperature is normal and within the target range for the patient.Marland Kitchen appears in no distress. Vitals Time Taken: 2:02 PM, Height: 65 in, Weight: 131 lbs, BMI: 21.8, Temperature: 97.8 F, Pulse: 99 bpm, Respiratory Rate: 18 breaths/min, Blood Pressure: 132/72 mmHg. Respiratory Respiratory effort is easy and symmetric bilaterally. Rate is normal at rest and on room air.. Cardiovascular Pedal pulses palpable and strong bilaterally.Marland Kitchen edema is well controlled. Psychiatric No evidence of depression, anxiety, or agitation. Calm, cooperative, and communicative. Appropriate interactions and affect.. General Notes: wound exam; the area on the left anterior tibia. There is no exposed bone dimensions are smaller. No evidence of surrounding infection the wound bed is healthy granulation Integumentary (Hair, Skin) no systemic skin issues are seen. Wound #1 status is Open. Original cause of wound was Trauma. The wound is located on the Left,Anterior Lower Leg. The wound measures 3.6cm length x 1.4cm width x 0.2cm depth; 3.958cm^2 area and 0.792cm^3 volume. There is muscle, Fat Layer (Subcutaneous Tissue) Exposed, and fascia exposed. There is no tunneling or undermining noted. There  is a large amount of serosanguineous drainage noted.  The wound margin is flat and intact. There is large (67-100%) red, pink, hyper - granulation within the wound bed. There is a small (1-33%) amount of necrotic tissue within the wound bed including Eschar and Adherent Slough. The periwound skin appearance exhibited: Scarring, Maceration, Hemosiderin Staining. The periwound skin appearance did not exhibit: Callus, Crepitus, Excoriation, Induration, Rash, Dry/Scaly, Atrophie Blanche, Cyanosis, Ecchymosis, Mottled, Pallor, Rubor, Erythema. Periwound temperature was noted as No Abnormality. The periwound has tenderness on palpation. 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 ([id number]) Wound #1 Left,Anterior Lower Leg: Clean wound with Normal Saline. Cleanse wound with mild soap and water Anesthetic (add to Medication List): Topical Lidocaine 4% cream applied to wound bed prior to debridement (In Clinic Only). Primary Wound Dressing: Wound #1 Left,Anterior Lower Leg: Hydrafera Blue Ready Transfer Secondary Dressing: ABD and Kerlix/Conform - coban to secure Dressing Change Frequency: Other: - twice weekly Follow-up Appointments: Return Appointment in 1 week. Home Health: [facility] Nurse may visit [facility] to address patient s wound care needs. FACE TO FACE ENCOUNTER: MEDICARE and MEDICAID PATIENTS: I certify that this patient is under my care and that I had a face-to-face encounter that meets the physician face-to-face encounter requirements with this patient on this date.  The encounter with the patient was in whole or in part for the following MEDICAL CONDITION: (primary reason for [place]) MEDICAL NECESSITY: I certify, that based on my findings, NURSING services are a medically necessary home health service. HOME BOUND STATUS: I certify that my clinical findings support that this patient is homebound (i.e., Due to illness or injury, pt requires aid of supportive devices such as crutches, cane, wheelchairs, walkers, the use of special transportation or the assistance of another person to leave their place of residence. There is a normal inability to leave the home and doing so requires considerable and taxing effort. Other absences are for medical reasons / religious services and are infrequent or of short duration when for other reasons). If current dressing causes regression in wound condition, may D/C ordered dressing product/s and apply Normal Saline Moist Dressing daily until next [place] / Other MD appointment. [place] of regression in wound condition at [phone]. Please direct any NON-WOUND related issues/requests for orders to patient's Primary Care Physician #1 clinically continue with Hydrofera Blue/ABDs/Kerlix/conform #2 she follows up with orthopedics with regards to the surgical wound in the left foot #3 we are continuing home health although next week we may be able to ask about whether that should continue. Her daughter seems anxious to discharge home health and do the dressings herself. If that were the case we need to get her Chrys Racer through her Musician) Signed: 03/09/2018 5:02:04 PM By: Linton Ham MD Entered By: Linton Ham on 03/09/2018 14:54:54 [name], [name] ([id number]) -------------------------------------------------------------------------------- SuperBill Details Patient Name: [name] P. Date of Service: 03/09/2018 Medical Record  Number: [id number] Patient Account Number: [id number] Date of Birth/Sex: [birth year] (84 y.o. 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 of other part of left lower leg with necrosis of bone I70.245 Atherosclerosis of native arteries of left leg with ulceration of other part of foot Facility Procedures CPT4 Code: [id number] Description: 44628 - WOUND CARE VISIT-LEV 2 EST PT Modifier: Quantity: 1 Physician Procedures CPT4 Code Description: [id number] [place] - WC PHYS LEVEL 3 - EST PT ICD-10 Diagnosis Description L97.824 Non-pressure chronic ulcer of other part of left lower leg with n E11.622 Type 2 diabetes mellitus with other skin ulcer Modifier: ecrosis of bone Quantity: 1 Electronic Signature(s) Signed: 03/09/2018 5:02:04 PM By: Linton Ham MD Previous Signature: 03/09/2018 2:39:25 PM Version By: Gretta Cool, BSN, RN, CWS, Kim RN, BSN Entered By: Linton Ham on 03/09/2018 14:55:26

## 2018-03-11 NOTE — Progress Notes (Signed)
[name], [name] ([id number]) Visit Report for 03/02/2018 Arrival Information Details Patient Name: [name], [name] Date of Service: 03/02/2018 1:00 PM Medical Record Number: [id number] Patient Account Number: [id number] Date of Birth/Sex: May 19, 1926 (84 y.o. F) Treating RN: Roger Shelter Primary Care Cledith Abdou: Lucianne Lei Other Clinician: Referring Nylan Nakatani: Lucianne Lei Treating Callista Hoh/Extender: Tito Dine in Treatment: 7 Visit Information History Since Last Visit All ordered tests and consults were completed: No Patient Arrived: Walker Added or deleted any medications: No Arrival Time: 12:48 Any new allergies or adverse reactions: No Accompanied By: self Had a fall or experienced change in No Transfer Assistance: None activities of daily living that may affect Patient Identification Verified: Yes risk of falls: Secondary Verification Process Yes Signs or symptoms of abuse/neglect since last visito No Completed: Hospitalized since last visit: No Patient Requires Transmission-Based No Implantable device outside of the clinic excluding No Precautions: cellular tissue based products placed in the center Patient Has Alerts: Yes since last visit: Patient Alerts: 01/04/2018 ABI @ Pain Present Now: No AVVS (R) 0.69 (L) 0.81 Electronic Signature(s) Signed: 03/03/2018 2:53:14 PM By: Roger Shelter Entered By: Roger Shelter on 03/02/2018 12:48:40 [name], [name] ([id number]) -------------------------------------------------------------------------------- Complex / Palliative Patient Assessment Details Patient Name: Alexa Lowes P. Date of Service: 03/02/2018 1:00 PM Medical Record Number: [id number] Patient Account Number: [id number] Date of Birth/Sex: [birth year] (84 y.o. F) Treating RN: Cornell Barman Primary Care Nayquan Evinger: Lucianne Lei Other Clinician: Referring Alekxander Isola: Lucianne Lei Treating Yisell Sprunger/Extender: Tito Dine in Treatment:  39 Palliative Management Criteria Complex Wound Management Criteria Evaluation by a vascular surgeon has determined that the patient is not a revascularization candidate due to: All procedures have been completed. No additional at this time. Care Approach Wound Care Plan: Complex Wound Management Electronic Signature(s) Signed: 03/03/2018 4:19:12 PM By: Gretta Cool, BSN, RN, CWS, Kim RN, BSN Signed: 03/09/2018 5:02:04 PM By: Linton Ham MD Entered By: Gretta Cool, BSN, RN, CWS, Kim on 03/03/2018 16:19:12 [name], [name] ([id number]) -------------------------------------------------------------------------------- Encounter Discharge Information Details Patient Name: [name], [name] Date of Service: 03/02/2018 1:00 PM Medical Record Number: [id number] Patient Account Number: [id number] Date of Birth/Sex: 01-31-1926 (84 y.o. F) Treating RN: Montey Hora Primary Care Keyleigh Manninen: Lucianne Lei Other Clinician: Referring Earon Rivest: Lucianne Lei Treating Keviana Guida/Extender: Tito Dine in Treatment: 27 Encounter Discharge Information Items Discharge Pain Level: 0 Discharge Condition: Stable Ambulatory Status: Walker Discharge Destination: Home Transportation: Private Auto Accompanied By: self Schedule Follow-up Appointment: Yes Medication Reconciliation completed and No provided to Patient/Care Anquan Azzarello: Provided on Clinical Summary of Care: 03/02/2018 Form Type Recipient Paper Patient GW Electronic Signature(s) Signed: 03/02/2018 4:04:39 PM By: Ruthine Dose Entered By: Ruthine Dose on 03/02/2018 13:40:43 [name], [name] ([id number]) -------------------------------------------------------------------------------- Lower Extremity Assessment Details Patient Name: Alexa Lowes P. Date of Service: 03/02/2018 1:00 PM Medical Record Number: [id number] Patient Account Number: [id number] Date of Birth/Sex: [birth year] (84 y.o. F) Treating RN: Roger Shelter Primary Care  Tapanga Ottaway: Lucianne Lei Other Clinician: Referring Adra Shepler: Lucianne Lei Treating Aloma Boch/Extender: Tito Dine in Treatment: 39 Vascular Assessment Claudication: Claudication Assessment [Left:None] Pulses: Dorsalis Pedis Palpable: [Left:No] Posterior Tibial Extremity colors, hair growth, and conditions: Extremity Color: [Left:Hyperpigmented] Hair Growth on Extremity: [Left:No] Temperature of Extremity: [Left:Warm] Electronic Signature(s) Signed: 03/03/2018 2:53:14 PM By: Roger Shelter Entered By: Roger Shelter on 03/02/2018 12:56:31 [name], [name] ([id number]) -------------------------------------------------------------------------------- Multi Wound Chart Details Patient Name: Alexa Lowes P. Date of Service: 03/02/2018 1:00 PM Medical Record Number: [id number] Patient Account Number: [id number] Date of Birth/Sex: 11-22-1926 (84 y.o.  F) Treating RN: Cornell Barman Primary Care Dvontae Ruan: Lucianne Lei Other Clinician: Referring Demarlo Riojas: Lucianne Lei Treating Jaziyah Gradel/Extender: Tito Dine in Treatment: 39 Vital Signs Height(in): 65 Pulse(bpm): 82 Weight(lbs): 131 Blood Pressure(mmHg): 146/61 Body Mass Index(BMI): 22 Temperature(F): 97.9 Respiratory Rate 18 (breaths/min): Photos: [1:No Photos] [N/A:N/A] Wound Location: [1:Left, Anterior Lower Leg] [N/A:N/A] Wounding Event: [1:Trauma] [N/A:N/A] Primary Etiology: [1:Diabetic Wound/Ulcer of the Lower Extremity] [N/A:N/A] Secondary Etiology: [1:Trauma, Other] [N/A:N/A] Comorbid History: [1:Anemia, Hypertension, Type II Diabetes, Osteoarthritis] [N/A:N/A] Date Acquired: [1:05/02/2017] [N/A:N/A] Weeks of Treatment: [1:39] [N/A:N/A] Wound Status: [1:Open] [N/A:N/A] Measurements L x W x D [1:4x1.8x0.2] [N/A:N/A] (cm) Area (cm) : [1:5.655] [N/A:N/A] Volume (cm) : [1:1.131] [N/A:N/A] % Reduction in Area: [1:-17.60%] [N/A:N/A] % Reduction in Volume: [1:-17.70%] [N/A:N/A] Classification:  [1:Grade 2] [N/A:N/A] Exudate Amount: [1:Large] [N/A:N/A] Exudate Type: [1:Serosanguineous] [N/A:N/A] Exudate Color: [1:red, brown] [N/A:N/A] Wound Margin: [1:Flat and Intact] [N/A:N/A] Granulation Amount: [1:Medium (34-66%)] [N/A:N/A] Granulation Quality: [1:Red, Pink, Hyper-granulation] [N/A:N/A] Necrotic Amount: [1:Medium (34-66%)] [N/A:N/A] Necrotic Tissue: [1:Eschar, Adherent Slough] [N/A:N/A] Exposed Structures: [1:Fascia: Yes Fat Layer (Subcutaneous Tissue) Exposed: Yes Muscle: Yes Tendon: No Joint: No Bone: No] [N/A:N/A] Epithelialization: [1:Small (1-33%)] [N/A:N/A] Debridement: [1:Debridement - Excisional] [N/A:N/A] Pre-procedure [1:13:18] [N/A:N/A] Verification/Time Out Taken: [name], [name] ([id number]) Pain Control: Other N/A N/A Tissue Debrided: Subcutaneous N/A N/A Level: Skin/Subcutaneous Tissue N/A N/A Debridement Area (sq cm): 7.2 N/A N/A Instrument: Curette N/A N/A Bleeding: Moderate N/A N/A Hemostasis Achieved: Pressure N/A N/A Procedural Pain: 0 N/A N/A Post Procedural Pain: 0 N/A N/A Debridement Treatment Procedure was tolerated well N/A N/A Response: Post Debridement 4x1.8x0.2 N/A N/A Measurements L x W x D (cm) Post Debridement Volume: 1.131 N/A N/A (cm) Periwound Skin Texture: Scarring: Yes N/A N/A Excoriation: No Induration: No Callus: No Crepitus: No Rash: No Periwound Skin Moisture: Maceration: Yes N/A N/A Dry/Scaly: No Periwound Skin Color: Hemosiderin Staining: Yes N/A N/A Atrophie Blanche: No Cyanosis: No Ecchymosis: No Erythema: No Mottled: No Pallor: No Rubor: No Temperature: No Abnormality N/A N/A Tenderness on Palpation: Yes N/A N/A Wound Preparation: Ulcer Cleansing: N/A N/A Rinsed/Irrigated with Saline Topical Anesthetic Applied: Other: lidocaine 4% Procedures Performed: Debridement N/A N/A Treatment Notes Wound #1 (Left, Anterior Lower Leg) 1. Cleansed with: Clean wound with Normal Saline 2.  Anesthetic Topical Lidocaine 4% cream to wound bed prior to debridement 4. Dressing Applied: Hydrafera Blue 5. Secondary Dressing Applied ABD Pad Kerlix/Conform Notes secured with netting [name], [name] ([id number]) Electronic Signature(s) Signed: 03/02/2018 4:49:21 PM By: Linton Ham MD Entered By: Linton Ham on 03/02/2018 13:41:44 Riegler, [name] ([id number]) -------------------------------------------------------------------------------- Multi-Disciplinary Care Plan Details Patient Name: Alexa Lowes P. Date of Service: 03/02/2018 1:00 PM Medical Record Number: [id number] Patient Account Number: [id number] Date of Birth/Sex: 30-Jun-1926 (84 y.o. F) Treating RN: Cornell Barman Primary Care Lillian Ballester: Lucianne Lei Other Clinician: Referring Kevona Lupinacci: Lucianne Lei Treating Dustan Hyams/Extender: Tito Dine in Treatment: 50 Active Inactive ` Abuse / Safety / Falls / Self Care Management Nursing Diagnoses: Potential for falls Goals: Patient will remain injury free related to falls Date Initiated: 05/28/2017 Target Resolution Date: 07/23/2017 Goal Status: Active Interventions: Assess fall risk on admission and as needed Notes: ` Orientation to the Wound Care Program Nursing Diagnoses: Knowledge deficit related to the wound healing center program Goals: Patient/caregiver will verbalize understanding of the [facility] Date Initiated: 05/28/2017 Target Resolution Date: 07/23/2017 Goal Status: Active Interventions: Provide education on orientation to the wound center Notes: ` Wound/Skin Impairment Nursing Diagnoses: Impaired tissue integrity Goals: Ulcer/skin breakdown will have a  volume reduction of 30% by week 4 Date Initiated: 05/28/2017 Target Resolution Date: 07/23/2017 Goal Status: Active Ulcer/skin breakdown will have a volume reduction of 50% by week 8 Date Initiated: 05/28/2017 Target Resolution Date: 07/23/2017 [name], [name] ([id number]) Goal Status: Active Ulcer/skin breakdown will have a volume reduction of 80% by week 12 Date Initiated: 05/28/2017 Target Resolution Date: 07/23/2017 Goal Status: Active Ulcer/skin breakdown will heal within 14 weeks Date Initiated: 05/28/2017 Target Resolution Date: 07/23/2017 Goal Status: Active Interventions: Assess patient/caregiver ability to obtain necessary supplies Assess patient/caregiver ability to perform ulcer/skin care regimen upon admission and as needed Assess ulceration(s) every visit Notes: Electronic Signature(s) Signed: 03/07/2018 9:08:37 AM By: Gretta Cool, BSN, RN, CWS, Kim RN, BSN Entered By: Gretta Cool, BSN, RN, CWS, Kim on 03/02/2018 13:22:49 [name], [name] ([id number]) -------------------------------------------------------------------------------- Pain Assessment Details Patient Name: Alexa Lowes P. Date of Service: 03/02/2018 1:00 PM Medical Record Number: [id number] Patient Account Number: [id number] Date of Birth/Sex: 1926-03-30 (84 y.o. F) Treating RN: Roger Shelter Primary Care Jamira Barfuss: Lucianne Lei Other Clinician: Referring Lottie Siska: Lucianne Lei Treating Ritha Sampedro/Extender: Tito Dine in Treatment: 39 Active Problems Location of Pain Severity and Description of Pain Patient Has Paino No Site Locations Pain Management and Medication Current Pain Management: Electronic Signature(s) Signed: 03/03/2018 2:53:14 PM By: Roger Shelter Entered By: Roger Shelter on 03/02/2018 12:48:47 Luhman, [name] ([id number]) -------------------------------------------------------------------------------- Patient/Caregiver Education Details Patient Name: Alexa Lowes P. Date of Service: 03/02/2018 1:00 PM Medical Record Number: [id number] Patient Account Number: [id number] Date of Birth/Gender: 02/23/1926 (84 y.o. F) Treating RN: Montey Hora Primary Care Physician: Lucianne Lei Other Clinician: Referring Physician:  Lucianne Lei Treating Physician/Extender: Tito Dine in Treatment: 33 Education Assessment Education Provided To: Patient Education Topics Provided Wound/Skin Impairment: Handouts: Other: wound care as ordered Methods: Demonstration, Explain/Verbal Responses: State content correctly Electronic Signature(s) Signed: 03/02/2018 4:21:37 PM By: Montey Hora Entered By: Montey Hora on 03/02/2018 13:33:41 [name], [name] ([id number]) -------------------------------------------------------------------------------- Wound Assessment Details Patient Name: Alexa Lowes P. Date of Service: 03/02/2018 1:00 PM Medical Record Number: [id number] Patient Account Number: [id number] Date of Birth/Sex: 1926/04/29 (84 y.o. F) Treating RN: Cornell Barman Primary Care Cai Flott: Lucianne Lei Other Clinician: Referring Saloma Cadena: Lucianne Lei Treating Timiko Offutt/Extender: Tito Dine in Treatment: 36 Wound Status Wound Number: 1 Primary Etiology: Diabetic Wound/Ulcer of the Lower Extremity Wound Location: Left, Anterior Lower Leg Secondary Trauma, Other Wounding Event: Trauma Etiology: Date Acquired: 05/02/2017 Wound Status: Open Weeks Of Treatment: 39 Comorbid Anemia, Hypertension, Type II Diabetes, Clustered Wound: No History: Osteoarthritis Photos Photo Uploaded By: Roger Shelter on 03/02/2018 14:13:13 Wound Measurements Length: (cm) 4 Width: (cm) 1.8 Depth: (cm) 0.2 Area: (cm) 5.655 Volume: (cm) 1.131 % Reduction in Area: -17.6% % Reduction in Volume: -17.7% Epithelialization: Small (1-33%) Tunneling: No Undermining: No Wound Description Classification: Grade 2 Wound Margin: Flat and Intact Exudate Amount: Large Exudate Type: Serosanguineous Exudate Color: red, brown Foul Odor After Cleansing: No Slough/Fibrino Yes Wound Bed Granulation Amount: Medium (34-66%) Exposed Structure Granulation Quality: Red, Pink, Hyper-granulation Fascia Exposed:  Yes Necrotic Amount: Medium (34-66%) Fat Layer (Subcutaneous Tissue) Exposed: Yes Necrotic Quality: Eschar, Adherent Slough Tendon Exposed: No Muscle Exposed: Yes Necrosis of Muscle: No Joint Exposed: No Bone Exposed: No Periwound Skin Texture Texture Color [name], [name] ([id number]) No Abnormalities Noted: No No Abnormalities Noted: No Callus: No Atrophie Blanche: No Crepitus: No Cyanosis: No Excoriation: No Ecchymosis: No Induration: No Erythema: No Rash: No Hemosiderin Staining: Yes Scarring: Yes Mottled: No Pallor: No Moisture Rubor: No  No Abnormalities Noted: No Dry / Scaly: No Temperature / Pain Maceration: Yes Temperature: No Abnormality Tenderness on Palpation: Yes Wound Preparation Ulcer Cleansing: Rinsed/Irrigated with Saline Topical Anesthetic Applied: Other: lidocaine 4%, Electronic Signature(s) Signed: 03/07/2018 9:08:37 AM By: Gretta Cool, BSN, RN, CWS, Kim RN, BSN Entered By: Gretta Cool, BSN, RN, CWS, Kim on 03/02/2018 13:22:39 [name], [name] ([id number]) -------------------------------------------------------------------------------- Vitals Details Patient Name: Alexa Lowes P. Date of Service: 03/02/2018 1:00 PM Medical Record Number: [id number] Patient Account Number: [id number] Date of Birth/Sex: 08-Mar-1926 (84 y.o. F) Treating RN: Roger Shelter Primary Care Keirstan Iannello: Lucianne Lei Other Clinician: Referring Marquavis Hannen: Lucianne Lei Treating Bowen Goyal/Extender: Tito Dine in Treatment: 39 Vital Signs Time Taken: 12:49 Temperature (F): 97.9 Height (in): 65 Pulse (bpm): 89 Weight (lbs): 131 Respiratory Rate (breaths/min): 18 Body Mass Index (BMI): 21.8 Blood Pressure (mmHg): 146/61 Reference Range: 80 - 120 mg / dl Electronic Signature(s) Signed: 03/03/2018 2:53:14 PM By: Roger Shelter Entered By: Roger Shelter on 03/02/2018 12:50:56

## 2018-03-13 NOTE — Progress Notes (Signed)
[name], [name] ([id number]) Visit Report for 03/09/2018 Arrival Information Details Patient Name: [name], [name] Date of Service: 03/09/2018 1:45 PM Medical Record Number: [id number] Patient Account Number: [id number] Date of Birth/Sex: 01/20/1926 (84 y.o. F) Treating RN: Cornell Barman Primary Care Logan [place]: Lucianne Lei Other Clinician: Referring Solene Hereford: Lucianne Lei Treating Stavros Cail/Extender: Tito Dine in Treatment: 55 Visit Information History Since Last Visit Added or deleted any medications: No Patient Arrived: [name] Any new allergies or adverse reactions: No Arrival Time: 14:02 Had a fall or experienced change in No Accompanied By: granddaughter activities of daily living that may affect Transfer Assistance: None risk of falls: Patient Identification Verified: Yes Signs or symptoms of abuse/neglect since last visito No Secondary Verification Process Yes Hospitalized since last visit: No Completed: Implantable device outside of the clinic excluding No Patient Requires Transmission-Based No cellular tissue based products placed in the center Precautions: since last visit: Patient Has Alerts: Yes Has Dressing in Place as Prescribed: Yes Patient Alerts: 01/04/2018 ABI @ Pain Present Now: No AVVS (R) 0.69 (L) 0.81 Electronic Signature(s) Signed: 03/09/2018 4:09:34 PM By: Lorine Bears RCP, RRT, CHT Entered By: Becky Sax, Amado Nash on 03/09/2018 14:18:26 [name] ([id number]) -------------------------------------------------------------------------------- Clinic Level of Care Assessment Details Patient Name: Alexa Lowes P. Date of Service: 03/09/2018 1:45 PM Medical Record Number: [id number] Patient Account Number: [id number] Date of Birth/Sex: 27-May-1926 (84 y.o. F) Treating RN: Montey Hora Primary Care Gust Eugene: Lucianne Lei Other Clinician: Referring Murry Diaz: Lucianne Lei Treating Delight Bickle/Extender:  Tito Dine in Treatment: 40 Clinic Level of Care Assessment Items TOOL 4 Quantity Score []  - Use when only an EandM is performed on FOLLOW-UP visit 0 ASSESSMENTS - Nursing Assessment / Reassessment []  - Reassessment of Co-morbidities (includes updates in patient status) 0 X- 1 5 Reassessment of Adherence to Treatment Plan ASSESSMENTS - Wound and Skin Assessment / Reassessment X - Simple Wound Assessment / Reassessment - one wound 1 5 []  - 0 Complex Wound Assessment / Reassessment - multiple wounds []  - 0 Dermatologic / Skin Assessment (not related to wound area) ASSESSMENTS - Focused Assessment []  - Circumferential Edema Measurements - multi extremities 0 []  - 0 Nutritional Assessment / Counseling / Intervention []  - 0 Lower Extremity Assessment (monofilament, tuning fork, pulses) []  - 0 Peripheral Arterial Disease Assessment (using hand held doppler) ASSESSMENTS - Ostomy and/or Continence Assessment and Care []  - Incontinence Assessment and Management 0 []  - 0 Ostomy Care Assessment and Management (repouching, etc.) PROCESS - Coordination of Care X - Simple Patient / Family Education for ongoing care 1 15 []  - 0 Complex (extensive) Patient / Family Education for ongoing care []  - 0 Staff obtains Programmer, systems, Records, Test Results / Process Orders []  - 0 Staff telephones HHA, Nursing Homes / Clarify orders / etc []  - 0 Routine Transfer to another Facility (non-emergent condition) []  - 0 Routine Hospital Admission (non-emergent condition) []  - 0 New Admissions / Biomedical engineer / Ordering NPWT, Apligraf, etc. []  - 0 Emergency Hospital Admission (emergent condition) X- 1 10 Simple Discharge Coordination [name], [name] ([id number]) []  - 0 Complex (extensive) Discharge Coordination PROCESS - Special Needs []  - Pediatric / Minor Patient Management 0 []  - 0 Isolation Patient Management []  - 0 Hearing / Language / Visual special needs []  -  0 Assessment of Community assistance (transportation, D/C planning, etc.) []  - 0 Additional assistance / Altered mentation []  - 0 Support Surface(s) Assessment (bed, cushion, seat, etc.) INTERVENTIONS - Wound Cleansing / Measurement X -  Simple Wound Cleansing - one wound 1 5 []  - 0 Complex Wound Cleansing - multiple wounds X- 1 5 Wound Imaging (photographs - any number of wounds) []  - 0 Wound Tracing (instead of photographs) X- 1 5 Simple Wound Measurement - one wound []  - 0 Complex Wound Measurement - multiple wounds INTERVENTIONS - Wound Dressings []  - Small Wound Dressing one or multiple wounds 0 X- 1 15 Medium Wound Dressing one or multiple wounds []  - 0 Large Wound Dressing one or multiple wounds []  - 0 Application of Medications - topical []  - 0 Application of Medications - injection INTERVENTIONS - Miscellaneous []  - External ear exam 0 []  - 0 Specimen Collection (cultures, biopsies, blood, body fluids, etc.) []  - 0 Specimen(s) / Culture(s) sent or taken to Lab for analysis []  - 0 Patient Transfer (multiple staff / Civil Service fast streamer / Similar devices) []  - 0 Simple Staple / Suture removal (25 or less) []  - 0 Complex Staple / Suture removal (26 or more) []  - 0 Hypo / Hyperglycemic Management (close monitor of Blood Glucose) []  - 0 Ankle / Brachial Index (ABI) - do not check if billed separately X- 1 5 Vital Signs [name], [name] ([id number]) Has the patient been seen at the hospital within the last three years: Yes Total Score: 70 Level Of Care: New/Established - Level 2 Electronic Signature(s) Signed: 03/09/2018 5:03:40 PM By: Montey Hora Entered By: Montey Hora on 03/09/2018 14:28:43 [name], [name] ([id number]) -------------------------------------------------------------------------------- Encounter Discharge Information Details Patient Name: Alexa Lowes P. Date of Service: 03/09/2018 1:45 PM Medical Record Number: [id number] Patient  Account Number: [id number] Date of Birth/Sex: 06-20-1926 (84 y.o. F) Treating RN: Cornell Barman Primary Care Altariq Goodall: Lucianne Lei Other Clinician: Referring Caly Pellum: Lucianne Lei Treating Korde Jeppsen/Extender: Tito Dine in Treatment: 40 Encounter Discharge Information Items Discharge Pain Level: 0 Discharge Condition: Stable Ambulatory Status: [name] Discharge Destination: Home Transportation: Private Auto Accompanied By: grand daughter Schedule Follow-up Appointment: Yes Medication Reconciliation completed and No provided to Patient/Care Sharnay Cashion: Provided on Clinical Summary of Care: 03/09/2018 Form Type Recipient Paper Patient [place] Signature(s) Signed: 03/11/2018 4:04:12 PM By: Roger Shelter Entered By: Roger Shelter on 03/09/2018 14:42:28 Musil, [name] [name] ([id number]) -------------------------------------------------------------------------------- Lower Extremity Assessment Details Patient Name: Alexa Lowes P. Date of Service: 03/09/2018 1:45 PM Medical Record Number: [id number] Patient Account Number: [id number] Date of Birth/Sex: 1926-07-05 (84 y.o. F) Treating RN: Montey Hora Primary Care Dewell Monnier: Lucianne Lei Other Clinician: Referring Amaurie Schreckengost: Lucianne Lei Treating Gabrien Mentink/Extender: Tito Dine in Treatment: 40 Vascular Assessment Pulses: Posterior Tibial Popliteal Palpable: [Right:Yes] Extremity colors, hair growth, and conditions: Extremity Color: [Right:Hyperpigmented] Hair Growth on Extremity: [Right:No] Temperature of Extremity: [Right:Warm] Electronic Signature(s) Signed: 03/09/2018 5:03:40 PM By: Montey Hora Entered By: Montey Hora on 03/09/2018 14:13:40 Gabrielsen, OYDXAJOI P. ([id number]) -------------------------------------------------------------------------------- Multi Wound Chart Details Patient Name: Alexa Lowes P. Date of Service: 03/09/2018 1:45 PM Medical Record Number:  [id number] Patient Account Number: [id number] Date of Birth/Sex: Jul 08, 1926 (84 y.o. F) Treating RN: Montey Hora Primary Care Vietta Bonifield: Lucianne Lei Other Clinician: Referring Donathan Buller: Lucianne Lei Treating Ashaunte Standley/Extender: Tito Dine in Treatment: 40 Vital Signs Height(in): 65 Pulse(bpm): 99 Weight(lbs): 131 Blood Pressure(mmHg): 132/72 Body Mass Index(BMI): 22 Temperature(F): 97.8 Respiratory Rate 18 (breaths/min): Photos: [1:No Photos] [N/A:N/A] Wound Location: [1:Left Lower Leg - Anterior] [N/A:N/A] Wounding Event: [1:Trauma] [N/A:N/A] Primary Etiology: [1:Diabetic Wound/Ulcer of the Lower Extremity] [N/A:N/A] Secondary Etiology: [1:Trauma, Other] [N/A:N/A] Comorbid History: [1:Anemia, Hypertension, Type II Diabetes, Osteoarthritis] [N/A:N/A] Date Acquired: [1:05/02/2017] [N/A:N/A] Weeks  of Treatment: [1:40] [N/A:N/A] Wound Status: [1:Open] [N/A:N/A] Measurements L x W x D [1:3.6x1.4x0.2] [N/A:N/A] (cm) Area (cm) : [1:3.958] [N/A:N/A] Volume (cm) : [1:0.792] [N/A:N/A] % Reduction in Area: [1:17.70%] [N/A:N/A] % Reduction in Volume: [1:17.60%] [N/A:N/A] Classification: [1:Grade 2] [N/A:N/A] Exudate Amount: [1:Large] [N/A:N/A] Exudate Type: [1:Serosanguineous] [N/A:N/A] Exudate Color: [1:red, brown] [N/A:N/A] Wound Margin: [1:Flat and Intact] [N/A:N/A] Granulation Amount: [1:Large (67-100%)] [N/A:N/A] Granulation Quality: [1:Red, Pink, Hyper-granulation] [N/A:N/A] Necrotic Amount: [1:Small (1-33%)] [N/A:N/A] Necrotic Tissue: [1:Eschar, Adherent Slough] [N/A:N/A] Exposed Structures: [1:Fascia: Yes Fat Layer (Subcutaneous Tissue) Exposed: Yes Muscle: Yes Tendon: No Joint: No Bone: No] [N/A:N/A] Epithelialization: [1:Small (1-33%)] [N/A:N/A] Periwound Skin Texture: [1:Scarring: Yes Excoriation: No Induration: No] [N/A:N/A] Callus: No Crepitus: No Rash: No Periwound Skin Moisture: Maceration: Yes N/A N/A Dry/Scaly: No Periwound Skin  Color: Hemosiderin Staining: Yes N/A N/A Atrophie Blanche: No Cyanosis: No Ecchymosis: No Erythema: No Mottled: No Pallor: No Rubor: No Temperature: No Abnormality N/A N/A Tenderness on Palpation: Yes N/A N/A Wound Preparation: Ulcer Cleansing: N/A N/A Rinsed/Irrigated with Saline Topical Anesthetic Applied: Other: lidocaine 4% Treatment Notes Wound #1 (Left, Anterior Lower Leg) 1. Cleansed with: Clean wound with Normal Saline 2. Anesthetic Topical Lidocaine 4% cream to wound bed prior to debridement 4. Dressing Applied: Hydrafera Blue Notes kerlix and coban light wrap with tape, secured with netting Electronic Signature(s) Signed: 03/09/2018 5:02:04 PM By: Linton Ham MD Entered By: Linton Ham on 03/09/2018 14:47:15 Moffit, [name] [name] ([id number]) -------------------------------------------------------------------------------- Multi-Disciplinary Care Plan Details Patient Name: Alexa Lowes P. Date of Service: 03/09/2018 1:45 PM Medical Record Number: [id number] Patient Account Number: [id number] Date of Birth/Sex: October 13, 1926 (84 y.o. F) Treating RN: Montey Hora Primary Care Shenee Wignall: Lucianne Lei Other Clinician: Referring Cera Rorke: Lucianne Lei Treating Kadasia Kassing/Extender: Tito Dine in Treatment: 40 Active Inactive ` Abuse / Safety / Falls / Self Care Management Nursing Diagnoses: Potential for falls Goals: Patient will remain injury free related to falls Date Initiated: 05/28/2017 Target Resolution Date: 07/23/2017 Goal Status: Active Interventions: Assess fall risk on admission and as needed Notes: ` Orientation to the Wound Care Program Nursing Diagnoses: Knowledge deficit related to the wound healing center program Goals: Patient/caregiver will verbalize understanding of the [facility] Program Date Initiated: 05/28/2017 Target Resolution Date: 07/23/2017 Goal Status: Active Interventions: Provide education on  orientation to the wound center Notes: ` Wound/Skin Impairment Nursing Diagnoses: Impaired tissue integrity Goals: Ulcer/skin breakdown will have a volume reduction of 30% by week 4 Date Initiated: 05/28/2017 Target Resolution Date: 07/23/2017 Goal Status: Active Ulcer/skin breakdown will have a volume reduction of 50% by week 8 Date Initiated: 05/28/2017 Target Resolution Date: 07/23/2017 DAYTON, SHERR ([id number]) Goal Status: Active Ulcer/skin breakdown will have a volume reduction of 80% by week 12 Date Initiated: 05/28/2017 Target Resolution Date: 07/23/2017 Goal Status: Active Ulcer/skin breakdown will heal within 14 weeks Date Initiated: 05/28/2017 Target Resolution Date: 07/23/2017 Goal Status: Active Interventions: Assess patient/caregiver ability to obtain necessary supplies Assess patient/caregiver ability to perform ulcer/skin care regimen upon admission and as needed Assess ulceration(s) every visit Notes: Electronic Signature(s) Signed: 03/09/2018 5:03:40 PM By: Montey Hora Entered By: Montey Hora on 03/09/2018 14:26:29 Soberanis, HTDSKAJG P. ([id number]) -------------------------------------------------------------------------------- Pain Assessment Details Patient Name: Alexa Lowes P. Date of Service: 03/09/2018 1:45 PM Medical Record Number: [id number] Patient Account Number: [id number] Date of Birth/Sex: Mar 14, 1926 (84 y.o. F) Treating RN: Cornell Barman Primary Care Susane Bey: Lucianne Lei Other Clinician: Referring Saivon Prowse: Lucianne Lei Treating Kiosha Buchan/Extender: Tito Dine in Treatment: 40 Active Problems Location of Pain Severity  and Description of Pain Patient Has Paino No Site Locations Pain Management and Medication Current Pain Management: Electronic Signature(s) Signed: 03/09/2018 2:55:02 PM By: Gretta Cool, BSN, RN, CWS, Kim RN, BSN Signed: 03/09/2018 4:09:34 PM By: Lorine Bears RCP, RRT, CHT Entered By: Lorine Bears on 03/09/2018 14:03:47 Gumz, [name] [name] ([id number]) -------------------------------------------------------------------------------- Patient/Caregiver Education Details Patient Name: Alexa Lowes P. Date of Service: 03/09/2018 1:45 PM Medical Record Number: [id number] Patient Account Number: [id number] Date of Birth/Gender: [birth year] (84 y.o. F) Treating RN: Roger Shelter Primary Care Physician: Lucianne Lei Other Clinician: Referring Physician: Lucianne Lei Treating Physician/Extender: Tito Dine in Treatment: 26 Education Assessment Education Provided To: Patient Education Topics Provided Wound Debridement: Handouts: Wound Debridement Methods: Explain/Verbal Responses: State content correctly Wound/Skin Impairment: Handouts: Caring for Your Ulcer Methods: Explain/Verbal Responses: State content correctly Electronic Signature(s) Signed: 03/11/2018 4:04:12 PM By: Roger Shelter Entered By: Roger Shelter on 03/09/2018 14:42:47 Dilling, Londin P. ([id number]) -------------------------------------------------------------------------------- Wound Assessment Details Patient Name: Alexa Lowes P. Date of Service: 03/09/2018 1:45 PM Medical Record Number: [id number] Patient Account Number: [id number] Date of Birth/Sex: 1926/11/02 (84 y.o. F) Treating RN: Montey Hora Primary Care Jacere Pangborn: Lucianne Lei Other Clinician: Referring Charletha Dalpe: Lucianne Lei Treating Jorgeluis Gurganus/Extender: Tito Dine in Treatment: 40 Wound Status Wound Number: 1 Primary Etiology: Diabetic Wound/Ulcer of the Lower Extremity Wound Location: Left Lower Leg - Anterior Secondary Trauma, Other Wounding Event: Trauma Etiology: Date Acquired: 05/02/2017 Wound Status: Open Weeks Of Treatment: 40 Comorbid Anemia, Hypertension, Type II Diabetes, Clustered Wound: No History: Osteoarthritis Photos Photo Uploaded By: Montey Hora on 03/09/2018  15:14:08 Wound Measurements Length: (cm) 3.6 Width: (cm) 1.4 Depth: (cm) 0.2 Area: (cm) 3.958 Volume: (cm) 0.792 % Reduction in Area: 17.7% % Reduction in Volume: 17.6% Epithelialization: Small (1-33%) Tunneling: No Undermining: No Wound Description Classification: Grade 2 Wound Margin: Flat and Intact Exudate Amount: Large Exudate Type: Serosanguineous Exudate Color: red, brown Foul Odor After Cleansing: No Slough/Fibrino Yes Wound Bed Granulation Amount: Large (67-100%) Exposed Structure Granulation Quality: Red, Pink, Hyper-granulation Fascia Exposed: Yes Necrotic Amount: Small (1-33%) Fat Layer (Subcutaneous Tissue) Exposed: Yes Necrotic Quality: Eschar, Adherent Slough Tendon Exposed: No Muscle Exposed: Yes Necrosis of Muscle: No Joint Exposed: No Bone Exposed: No Plude, Yarelin P. ([id number]) Periwound Skin Texture Texture Color No Abnormalities Noted: No No Abnormalities Noted: No Callus: No Atrophie Blanche: No Crepitus: No Cyanosis: No Excoriation: No Ecchymosis: No Induration: No Erythema: No Rash: No Hemosiderin Staining: Yes Scarring: Yes Mottled: No Pallor: No Moisture Rubor: No No Abnormalities Noted: No Dry / Scaly: No Temperature / Pain Maceration: Yes Temperature: No Abnormality Tenderness on Palpation: Yes Wound Preparation Ulcer Cleansing: Rinsed/Irrigated with Saline Topical Anesthetic Applied: Other: lidocaine 4%, Treatment Notes Wound #1 (Left, Anterior Lower Leg) 1. Cleansed with: Clean wound with Normal Saline 2. Anesthetic Topical Lidocaine 4% cream to wound bed prior to debridement 4. Dressing Applied: Hydrafera Blue Notes kerlix and coban light wrap with tape, secured with netting Electronic Signature(s) Signed: 03/09/2018 5:03:40 PM By: Montey Hora Entered By: Montey Hora on 03/09/2018 14:12:17 Cervone, [name] [name]  ([id number]) -------------------------------------------------------------------------------- Vitals Details Patient Name: Alexa Lowes P. Date of Service: 03/09/2018 1:45 PM Medical Record Number: [id number] Patient Account Number: [id number] Date of Birth/Sex: March 06, 1926 (84 y.o. F) Treating RN: Cornell Barman Primary Care Lelania Bia: Lucianne Lei Other Clinician: Referring Dalyla Chui: Lucianne Lei Treating Roizy Harold/Extender: Tito Dine in Treatment: 40 Vital Signs Time Taken: 14:02 Temperature (F): 97.8 Height (in): 65 Pulse (bpm): 99 Weight (lbs): 131 Respiratory Rate (  breaths/min): 18 Body Mass Index (BMI): 21.8 Blood Pressure (mmHg): 132/72 Reference Range: 80 - 120 mg / dl Electronic Signature(s) Signed: 03/09/2018 4:09:34 PM By: Lorine Bears RCP, RRT, CHT Entered By: Lorine Bears on 03/09/2018 14:04:14

## 2018-03-16 ENCOUNTER — Ambulatory Visit: Payer: Medicare Other | Admitting: Internal Medicine

## 2018-03-22 ENCOUNTER — Encounter (INDEPENDENT_AMBULATORY_CARE_PROVIDER_SITE_OTHER): Payer: Self-pay | Admitting: Vascular Surgery

## 2018-03-22 ENCOUNTER — Ambulatory Visit (INDEPENDENT_AMBULATORY_CARE_PROVIDER_SITE_OTHER): Payer: Medicare Other | Admitting: Vascular Surgery

## 2018-03-22 ENCOUNTER — Ambulatory Visit (INDEPENDENT_AMBULATORY_CARE_PROVIDER_SITE_OTHER): Payer: Medicare Other

## 2018-03-22 VITALS — BP 156/75 | HR 90 | Resp 16 | Wt 117.0 lb

## 2018-03-22 DIAGNOSIS — I7025 Atherosclerosis of native arteries of other extremities with ulceration: Secondary | ICD-10-CM

## 2018-03-22 DIAGNOSIS — I70299 Other atherosclerosis of native arteries of extremities, unspecified extremity: Secondary | ICD-10-CM

## 2018-03-22 DIAGNOSIS — L97909 Non-pressure chronic ulcer of unspecified part of unspecified lower leg with unspecified severity: Secondary | ICD-10-CM

## 2018-03-22 DIAGNOSIS — E119 Type 2 diabetes mellitus without complications: Secondary | ICD-10-CM

## 2018-03-22 DIAGNOSIS — I10 Essential (primary) hypertension: Secondary | ICD-10-CM | POA: Diagnosis not present

## 2018-03-22 NOTE — Progress Notes (Signed)
MRN : [id number]  [name] is a 84 y.o. (1926/06/28) female who presents with chief complaint of  Chief Complaint  Patient presents with  . Follow-up    2-14monthabi  .  History of Present Illness: Patient returns today in follow up of her PAD and ulcerations.  Her podiatrist and her wound care specialist are both very pleased with her progress of her wounds although they have not completely healed.  Her pain is much improved.  She is not having any rest pain.  No fever or chills.  Her noninvasive studies today demonstrate ABIs of 0.68 on the right which is stable and 0.88 on the left which is also stable to slightly improved.  Her left leg waveforms are quite good.  Current Outpatient Medications  Medication Sig Dispense Refill  . ACCU-CHEK FASTCLIX LANCETS MISC TEST TID  1  . ACCU-CHEK GUIDE test strip     . amLODipine (NORVASC) 10 MG tablet Take 10 mg by mouth daily.    .Marland Kitchenaspirin EC 81 MG tablet Take 81 mg by mouth daily.    .Marland Kitchenatorvastatin (LIPITOR) 10 MG tablet Take 10 mg by mouth daily.    . Blood Glucose Monitoring Suppl (ACCU-CHEK GUIDE) w/Device KIT See admin instructions.  0  . Calcium-Magnesium-Zinc (CAL-MAG-ZINC PO) Take 1 tablet by mouth daily.    . carvedilol (COREG) 6.25 MG tablet Take 6.25 mg by mouth at bedtime.    . Cholecalciferol (VITAMIN D3 GUMMIES ADULT PO) Take 1 tablet by mouth daily.    . clopidogrel (PLAVIX) 75 MG tablet Take 1 tablet (75 mg total) by mouth daily with breakfast. 30 tablet 1  . doxycycline (VIBRA-TABS) 100 MG tablet Take 1 tablet (100 mg total) by mouth every 12 (twelve) hours. 20 tablet 0  . metFORMIN (GLUCOPHAGE) 1000 MG tablet Take 1,000 mg by mouth 2 (two) times daily with a meal.    . pantoprazole (PROTONIX) 20 MG tablet Take 1 tablet (20 mg total) by mouth daily. 30 tablet 1  . polyethylene glycol (MIRALAX / GLYCOLAX) packet Take 17 g by mouth daily as needed (for constipation.).     .Marland KitchenSANTYL ointment Apply 1 application topically  daily. Applied to wound of leg and dress wound  0  . valsartan-hydrochlorothiazide (DIOVAN-HCT) 320-25 MG tablet Take 1 tablet by mouth daily.    .Marland Kitchenamoxicillin-clavulanate (AUGMENTIN) 875-125 MG tablet Take 1 tablet by mouth every 12 (twelve) hours. (Patient not taking: Reported on 03/22/2018) 20 tablet 0  . oxyCODONE-acetaminophen (PERCOCET/ROXICET) 5-325 MG tablet Take 1-2 tablets by mouth every 6 (six) hours as needed for severe pain. (Patient not taking: Reported on 03/22/2018) 20 tablet 0  . traMADol (ULTRAM) 50 MG tablet Take 1 tablet (50 mg total) by mouth every 6 (six) hours as needed for moderate pain. (Patient not taking: Reported on 03/22/2018) 30 tablet 1   No current facility-administered medications for this visit.     Past Medical History:  Diagnosis Date  . Anemia    family unsure of this history  . Arthritis   . Chronic ulcer of calf (HCC)    Left  . Constipation   . [place] complication ([place]   . Dyslipidemia   . GERD (gastroesophageal reflux disease)   . Hypertension   . Peripheral vascular disease ([facility]     Past Surgical History:  Procedure Laterality Date  . ABDOMINAL AORTOGRAM W/LOWER EXTREMITY Left 12/27/2017   Procedure: ABDOMINAL AORTOGRAM W/LOWER EXTREMITY;  Surgeon: DLeotis Pain  S, MD;  Location: [place] [facility];  Service: Cardiovascular;  Laterality: Left;  . ABDOMINAL HYSTERECTOMY    . AMPUTATION TOE Left 12/23/2017   Procedure: AMPUTATION TOE LEFT GREAT TOE;  Surgeon: Sharlotte Alamo, DPM;  Location: [facility];  Service: Podiatry;  Laterality: Left;  . ENDARTERECTOMY FEMORAL Left 09/08/2017   Procedure: ENDARTERECTOMY FEMORAL WITH CORMATRIX PATCH;  Surgeon: Algernon Huxley, MD;  Location: [facility];  Service: Vascular;  Laterality: Left;  . EYE SURGERY Bilateral    Cataract Extraction with IOL  . LOWER EXTREMITY ANGIOGRAPHY Left 07/22/2017   Procedure: Lower Extremity Angiography;  Surgeon: Algernon Huxley, MD;  Location: [place] [facility];   Service: Cardiovascular;  Laterality: Left;  . LOWER EXTREMITY ANGIOGRAPHY Left 08/16/2017   Procedure: Lower Extremity Angiography;  Surgeon: Algernon Huxley, MD;  Location: [place] [facility];  Service: Cardiovascular;  Laterality: Left;  Marland Kitchen MULTIPLE TOOTH EXTRACTIONS      Social History Social History   Tobacco Use  . Smoking status: Never Smoker  . Smokeless tobacco: Never Used  Substance Use Topics  . Alcohol use: No  . Drug use: No      Family History No family history on file.   No Known Allergies   REVIEW OF SYSTEMS(Negative unless checked)  Constitutional: [] Weight loss[] Fever[] Chills Cardiac:[] Chest pain[] Chest pressure[] Palpitations [] Shortness of breath when laying flat [] Shortness of breath at rest [] Shortness of breath with exertion. Vascular: [] Pain in legs with walking[] Pain in legsat rest[] Pain in legs when laying flat [] Claudication [] Pain in feet when walking [] Pain in feet at rest [] Pain in feet when laying flat [] History of DVT [] Phlebitis [x] Swelling in legs [] Varicose veins [] Non-healing ulcers Pulmonary: [] Uses home oxygen [] Productive cough[] Hemoptysis [] Wheeze [] COPD [] Asthma Neurologic: [] Dizziness [] Blackouts [] Seizures [] History of stroke [] History of TIA[] Aphasia [] Temporary blindness[] Dysphagia [] Weaknessor numbness in arms [] Weakness or numbnessin legs Musculoskeletal: [] Arthritis [] Joint swelling [] Joint pain [] Low back pain Hematologic:[] Easy bruising[] Easy bleeding [] Hypercoagulable state [] Anemic  Gastrointestinal:[] Blood in stool[] Vomiting blood[] Gastroesophageal reflux/heartburn[] Abdominal pain Genitourinary: [] Chronic kidney disease [] Difficulturination [] Frequenturination [] Burning with urination[] Hematuria Skin: [] Rashes [x] Ulcers [x] Wounds Psychological: [] History of anxiety[] History of major depression.   Physical  Examination  BP (!) 156/75 (BP Location: Right Arm)   Pulse 90   Resp 16   Wt 53.1 kg (117 lb)   BMI 18.60 kg/m  Gen:  WD/WN, NAD.  Appears younger than stated age Head: [place]/AT, No temporalis wasting. Ear/Nose/Throat: Hearing grossly intact, nares w/o erythema or drainage Eyes: Conjunctiva clear. Sclera non-icteric Neck: Supple.  Trachea midline Pulmonary:  Good air movement, no use of accessory muscles.  Cardiac: Irregular Vascular:  Vessel Right Left  Radial Palpable Palpable                          PT  1+ palpable  1+ palpable  DP  1+ palpable Palpable    Musculoskeletal: M/S 5/5 throughout.  No deformity or atrophy.  Mild left lower extremity edema. Neurologic: Sensation grossly intact in extremities.  Symmetrical.  Speech is fluent.  Psychiatric: Judgment intact, Mood & affect appropriate for pt's clinical situation. Dermatologic: No rashes or ulcers noted.  No cellulitis or open wounds.       Labs Recent Results (from the past 2160 hour(s))  Lactic acid, plasma     Status: Abnormal   Collection Time: 12/23/17  2:32 PM  Result Value Ref Range   Lactic Acid, Venous 2.0 (HH) 0.5 - 1.9 mmol/L    Comment: CRITICAL RESULT CALLED TO, READ BACK BY AND  VERIFIED WITH SUSAN NEAL AT 9326 12/23/17 DAS Performed at [facility], [address] [place], [place] [address]   Comprehensive metabolic panel     Status: Abnormal   Collection Time: 12/23/17  2:32 PM  Result Value Ref Range   Sodium 137 135 - 145 mmol/L   Potassium 4.2 3.5 - 5.1 mmol/L   Chloride 102 101 - 111 mmol/L   CO2 25 22 - 32 mmol/L   Glucose, Bld 112 (H) 65 - 99 mg/dL   BUN 14 6 - 20 mg/dL   Creatinine, Ser 1.01 (H) 0.44 - 1.00 mg/dL   Calcium 9.1 8.9 - 10.3 mg/dL   Total Protein 9.7 (H) 6.5 - 8.1 g/dL   Albumin 2.9 (L) 3.5 - 5.0 g/dL   AST 27 15 - 41 U/L   ALT 12 (L) 14 - 54 U/L   Alkaline Phosphatase 68 38 - 126 U/L   Total Bilirubin 0.5 0.3 - 1.2 mg/dL   GFR calc non Af Amer 48  (L) >60 mL/min   GFR calc Af Amer 55 (L) >60 mL/min    Comment: (NOTE) The eGFR has been calculated using the CKD EPI equation. This calculation has not been validated in all clinical situations. eGFR's persistently <60 mL/min signify possible Chronic Kidney Disease.    Anion gap 10 5 - 15    Comment: Performed at [facility], [address] [place], [place] [address]  CBC with Differential     Status: Abnormal   Collection Time: 12/23/17  2:32 PM  Result Value Ref Range   WBC 17.7 (H) 3.6 - 11.0 K/uL   RBC 3.36 (L) 3.80 - 5.20 MIL/uL   Hemoglobin 7.9 (L) 12.0 - 16.0 g/dL   HCT 24.7 (L) 35.0 - 47.0 %   MCV 73.4 (L) 80.0 - 100.0 fL   MCH 23.6 (L) 26.0 - 34.0 pg   MCHC 32.1 32.0 - 36.0 g/dL   RDW 18.9 (H) 11.5 - 14.5 %   Platelets 509 (H) 150 - 440 K/uL   Neutrophils Relative % 81 %   Neutro Abs 14.4 (H) 1.4 - 6.5 K/uL   Lymphocytes Relative 8 %   Lymphs Abs 1.4 1.0 - 3.6 K/uL   Monocytes Relative 10 %   Monocytes Absolute 1.8 (H) 0.2 - 0.9 K/uL   Eosinophils Relative 1 %   Eosinophils Absolute 0.1 0 - 0.7 K/uL   Basophils Relative 0 %   Basophils Absolute 0.0 0 - 0.1 K/uL    Comment: Performed at [facility], [address] [place], [place] [address]  Blood Culture (routine x 2)     Status: None   Collection Time: 12/23/17  3:31 PM  Result Value Ref Range   Specimen Description      BLOOD Blood Culture results may not be optimal due to an inadequate volume of blood received in culture bottles   Special Requests BLOOD RIGHT FOREARM    Culture      NO GROWTH 5 DAYS Performed at [facility], [address], [place], [place] [address]    Report Status 12/28/2017 FINAL   Blood Culture (routine x 2)     Status: None   Collection Time: 12/23/17  3:32 PM  Result Value Ref Range   Specimen Description BLOOD Blood Culture adequate volume    Special Requests LEFT ANTECUBITAL    Culture      NO GROWTH 5 DAYS Performed at [facility],  [address] [place], [place] [address]    Report Status 12/28/2017 FINAL   Surgical pcr screen     Status: Abnormal   Collection Time: 12/23/17  6:36 PM  Result Value Ref Range   MRSA, PCR POSITIVE (A) NEGATIVE    Comment: RESULT CALLED TO, READ BACK BY AND VERIFIED WITH: THERESA COBLE ON 12/23/17 AT 2200 JAG    Staphylococcus aureus POSITIVE (A) NEGATIVE    Comment: (NOTE) The Xpert SA Assay (FDA approved for NASAL specimens in patients 7 years of age and older), is one component of a comprehensive surveillance program. It is not intended to diagnose infection nor to guide or monitor treatment. Performed at [facility], [address] [place], [place] [address]   Urinalysis, Complete w Microscopic     Status: Abnormal   Collection Time: 12/23/17  8:20 PM  Result Value Ref Range   Color, Urine STRAW (A) YELLOW   APPearance CLEAR (A) CLEAR   Specific Gravity, Urine 1.009 1.005 - 1.030   pH 7.0 5.0 - 8.0   Glucose, UA NEGATIVE NEGATIVE mg/dL   Hgb urine dipstick NEGATIVE NEGATIVE   Bilirubin Urine NEGATIVE NEGATIVE   Ketones, ur NEGATIVE NEGATIVE mg/dL   Protein, ur NEGATIVE NEGATIVE mg/dL   Nitrite NEGATIVE NEGATIVE   Leukocytes, UA NEGATIVE NEGATIVE   RBC / HPF 0-5 0 - 5 RBC/hpf   WBC, UA 0-5 0 - 5 WBC/hpf   Bacteria, UA NONE SEEN NONE SEEN   Squamous Epithelial / LPF 0-5 (A) NONE SEEN    Comment: Performed at [facility], [address] [place], [place] [address]  Aerobic/Anaerobic Culture (surgical/deep wound)     Status: None   Collection Time: 12/23/17 10:47 PM  Result Value Ref Range   Specimen Description      TOE LEFT GREAT TOE ULCER Performed at [facility], [address] [place], [place] [address]    Special Requests      NONE Performed at [facility], [address] [place], [place] [address]    Gram Stain      FEW WBC PRESENT, PREDOMINANTLY PMN ABUNDANT GRAM POSITIVE COCCI IN CHAINS RARE GRAM  NEGATIVE RODS    Culture      MODERATE GROUP B STREP(S.AGALACTIAE)ISOLATED FEW STAPHYLOCOCCUS AUREUS MODERATE FINEGOLDIA MAGNA ORGANISM 1 TESTING AGAINST S. AGALACTIAE NOT ROUTINELY PERFORMED DUE TO PREDICTABILITY OF AMP/PEN/VAN SUSCEPTIBILITY. Performed at [facility], [place] [address], [place], [place] [address]    Report Status 12/29/2017 FINAL    Organism ID, Bacteria STAPHYLOCOCCUS AUREUS       Susceptibility   Staphylococcus aureus - MIC*    CIPROFLOXACIN 4 RESISTANT Resistant     ERYTHROMYCIN <=0.25 SENSITIVE Sensitive     GENTAMICIN <=0.5 SENSITIVE Sensitive     OXACILLIN RESISTANT Resistant     TETRACYCLINE <=1 SENSITIVE Sensitive     VANCOMYCIN <=0.5 SENSITIVE Sensitive     TRIMETH/SULFA <=10 SENSITIVE Sensitive     CLINDAMYCIN <=0.25 SENSITIVE Sensitive     RIFAMPIN <=0.5 SENSITIVE Sensitive     Inducible Clindamycin NEGATIVE Sensitive     * FEW STAPHYLOCOCCUS AUREUS  Surgical pathology     Status: None   Collection Time: 12/23/17 11:04 PM  Result Value Ref Range   SURGICAL PATHOLOGY      Surgical Pathology CASE: [id number] PATIENT: [facility] Surgical Pathology Report     SPECIMEN SUBMITTED: A. Great toe, left  CLINICAL HISTORY: None provided  PRE-OPERATIVE DIAGNOSIS: Gas in tissue, left great toe  ulcer  POST-OPERATIVE DIAGNOSIS: Same as pre-op     DIAGNOSIS: A. GREAT TOE, LEFT; AMPUTATION: - SKIN AND SOFT TISSUE WITH CHRONIC ULCERATION AND UNDERLYING OSTEOMYELITIS. - VIABLE MARGINS OF RESECTION.   GROSS DESCRIPTION:  A. Labeled: left great toe  Size: 6.3 x 2.7 x 2.6cm  Description of lesion(s): 1.4 x 1.3 cm ulcer on the plantar aspect in the entire digit is discolored green to black with sloughing skin  Proximal margin: skin discoloration extends to margin, margin inked blue  Bone: bone at margin is a smooth articular surface with some yellow chalky white areas  Other findings: received in formalin  Block  summary: 1-perpendicular skin margin 2-en face bone at margin following decalcification 3-representative ul cer with underlying bone following decalcification   Final Diagnosis performed by Quay Burow, MD.  Electronically signed 12/27/2017 3:04:54PM    The electronic signature indicates that the named Attending Pathologist has evaluated the specimen  Technical component performed at [facility], [address] [place], [place] [address] Lab: [phone] Dir: Rush Farmer, MD, MMM  Professional component performed at [facility], [facility], [address], [place], [place] [address] Lab: [phone] Dir: Dellia Nims. Rubinas, MD    Glucose, capillary     Status: Abnormal   Collection Time: 12/24/17 12:41 AM  Result Value Ref Range   Glucose-Capillary 139 (H) 65 - 99 mg/dL   Comment 1 Notify RN   Basic metabolic panel     Status: Abnormal   Collection Time: 12/24/17  3:58 AM  Result Value Ref Range   Sodium 136 135 - 145 mmol/L   Potassium 3.9 3.5 - 5.1 mmol/L   Chloride 104 101 - 111 mmol/L   CO2 24 22 - 32 mmol/L   Glucose, Bld 200 (H) 65 - 99 mg/dL   BUN 12 6 - 20 mg/dL   Creatinine, Ser 0.86 0.44 - 1.00 mg/dL   Calcium 8.3 (L) 8.9 - 10.3 mg/dL   GFR calc non Af Amer 58 (L) >60 mL/min   GFR calc Af Amer >60 >60 mL/min    Comment: (NOTE) The eGFR has been calculated using the CKD EPI equation. This calculation has not been validated in all clinical situations. eGFR's persistently <60 mL/min signify possible Chronic Kidney Disease.    Anion gap 8 5 - 15    Comment: Performed at [facility], [address] [place], [place] [address]  CBC     Status: Abnormal   Collection Time: 12/24/17  3:58 AM  Result Value Ref Range   WBC 16.5 (H) 3.6 - 11.0 K/uL   RBC 3.11 (L) 3.80 - 5.20 MIL/uL   Hemoglobin 7.1 (L) 12.0 - 16.0 g/dL   HCT 22.5 (L) 35.0 - 47.0 %   MCV 72.5 (L) 80.0 - 100.0 fL   MCH 23.0 (L) 26.0 - 34.0 pg   MCHC 31.7 (L) 32.0 - 36.0  g/dL   RDW 18.7 (H) 11.5 - 14.5 %   Platelets 448 (H) 150 - 440 K/uL    Comment: Performed at [facility], [address] [place], [place] [address]  Glucose, capillary     Status: Abnormal   Collection Time: 12/24/17  7:30 AM  Result Value Ref Range   Glucose-Capillary 154 (H) 65 - 99 mg/dL  Glucose, capillary     Status: Abnormal   Collection Time: 12/24/17 12:20 PM  Result Value Ref Range   Glucose-Capillary 171 (H) 65 - 99 mg/dL  Prepare RBC     Status:  None   Collection Time: 12/24/17  2:49 PM  Result Value Ref Range   Order Confirmation      ORDER PROCESSED BY BLOOD BANK Performed at [facility], [address] [place], [place] [address]   Type and screen     Status: None   Collection Time: 12/24/17  3:18 PM  Result Value Ref Range   ABO/RH(D) B POS    Antibody Screen NEG    Sample Expiration 12/27/2017    Unit Number [id number]    Blood Component Type RED CELLS,LR    Unit division 00    Status of Unit ISSUED,FINAL    Transfusion Status OK TO TRANSFUSE    Crossmatch Result      Compatible Performed at [facility], [address] [place], [place] [address]   BPAM RBC     Status: None   Collection Time: 12/24/17  3:18 PM  Result Value Ref Range   ISSUE DATE / TIME 517616073710    Blood Product Unit Number [id number]    PRODUCT CODE [id number]    Unit Type and Rh 7300    Blood Product Expiration Date [id number]   Glucose, capillary     Status: Abnormal   Collection Time: 12/24/17  4:42 PM  Result Value Ref Range   Glucose-Capillary 221 (H) 65 - 99 mg/dL   Comment 1 Notify RN   Glucose, capillary     Status: Abnormal   Collection Time: 12/24/17  9:10 PM  Result Value Ref Range   Glucose-Capillary 210 (H) 65 - 99 mg/dL  Hemoglobin     Status: Abnormal   Collection Time: 12/24/17  9:23 PM  Result Value Ref Range   Hemoglobin 8.2 (L) 12.0 - 16.0 g/dL    Comment: Performed at [facility], [address]  [place],  [address]  Glucose, capillary     Status: Abnormal   Collection Time: 12/25/17  8:02 AM  Result Value Ref Range   Glucose-Capillary 123 (H) 65 - 99 mg/dL  Glucose, capillary     Status: Abnormal   Collection Time: 12/25/17 12:11 PM  Result Value Ref Range   Glucose-Capillary 118 (H) 65 - 99 mg/dL  Glucose, capillary     Status: Abnormal   Collection Time: 12/25/17  4:56 PM  Result Value Ref Range   Glucose-Capillary 205 (H) 65 - 99 mg/dL  Glucose, capillary     Status: Abnormal   Collection Time: 12/25/17  9:00 PM  Result Value Ref Range   Glucose-Capillary 106 (H) 65 - 99 mg/dL  Glucose, capillary     Status: Abnormal   Collection Time: 12/26/17  7:42 AM  Result Value Ref Range   Glucose-Capillary 114 (H) 65 - 99 mg/dL   Comment 1 Notify RN   CBC with Differential/Platelet     Status: Abnormal   Collection Time: 12/26/17  9:38 AM  Result Value Ref Range   WBC 13.4 (H) 3.6 - 11.0 K/uL   RBC 3.92 3.80 - 5.20 MIL/uL   Hemoglobin 9.6 (L) 12.0 - 16.0 g/dL   HCT 29.4 (L) 35.0 - 47.0 %   MCV 74.9 (L) 80.0 - 100.0 fL   MCH 24.6 (L) 26.0 - 34.0 pg   MCHC 32.8 32.0 - 36.0 g/dL   RDW 21.3 (H) 11.5 - 14.5 %   Platelets 550 (H) 150 - 440 K/uL   Neutrophils Relative % 73 %   Neutro Abs 9.9 (H) 1.4 - 6.5 K/uL   Lymphocytes Relative  14 %   Lymphs Abs 1.9 1.0 - 3.6 K/uL   Monocytes Relative 10 %   Monocytes Absolute 1.3 (H) 0.2 - 0.9 K/uL   Eosinophils Relative 2 %   Eosinophils Absolute 0.2 0 - 0.7 K/uL   Basophils Relative 1 %   Basophils Absolute 0.1 0 - 0.1 K/uL    Comment: Performed at [facility], [address] [place], [place] [address]  Comprehensive metabolic panel     Status: Abnormal   Collection Time: 12/26/17  9:38 AM  Result Value Ref Range   Sodium 138 135 - 145 mmol/L   Potassium 3.2 (L) 3.5 - 5.1 mmol/L   Chloride 103 101 - 111 mmol/L   CO2 27 22 - 32 mmol/L   Glucose, Bld 120 (H) 65 - 99 mg/dL   BUN 12 6 - 20 mg/dL   Creatinine, Ser 1.04  (H) 0.44 - 1.00 mg/dL   Calcium 8.8 (L) 8.9 - 10.3 mg/dL   Total Protein 8.4 (H) 6.5 - 8.1 g/dL   Albumin 2.5 (L) 3.5 - 5.0 g/dL   AST 20 15 - 41 U/L   ALT 10 (L) 14 - 54 U/L   Alkaline Phosphatase 59 38 - 126 U/L   Total Bilirubin 0.6 0.3 - 1.2 mg/dL   GFR calc non Af Amer 46 (L) >60 mL/min   GFR calc Af Amer 54 (L) >60 mL/min    Comment: (NOTE) The eGFR has been calculated using the CKD EPI equation. This calculation has not been validated in all clinical situations. eGFR's persistently <60 mL/min signify possible Chronic Kidney Disease.    Anion gap 8 5 - 15    Comment: Performed at [facility], [address] [place], [place] [address]  Glucose, capillary     Status: Abnormal   Collection Time: 12/26/17 12:33 PM  Result Value Ref Range   Glucose-Capillary 251 (H) 65 - 99 mg/dL   Comment 1 Notify RN   Glucose, capillary     Status: Abnormal   Collection Time: 12/26/17  4:14 PM  Result Value Ref Range   Glucose-Capillary 49 (L) 65 - 99 mg/dL   Comment 1 Notify RN   Glucose, capillary     Status: Abnormal   Collection Time: 12/26/17  4:49 PM  Result Value Ref Range   Glucose-Capillary 120 (H) 65 - 99 mg/dL   Comment 1 Notify RN   Glucose, capillary     Status: Abnormal   Collection Time: 12/26/17  9:47 PM  Result Value Ref Range   Glucose-Capillary 209 (H) 65 - 99 mg/dL  CBC     Status: Abnormal   Collection Time: 12/27/17  5:33 AM  Result Value Ref Range   WBC 12.4 (H) 3.6 - 11.0 K/uL   RBC 3.68 (L) 3.80 - 5.20 MIL/uL   Hemoglobin 8.9 (L) 12.0 - 16.0 g/dL   HCT 27.6 (L) 35.0 - 47.0 %   MCV 74.9 (L) 80.0 - 100.0 fL   MCH 24.3 (L) 26.0 - 34.0 pg   MCHC 32.4 32.0 - 36.0 g/dL   RDW 21.3 (H) 11.5 - 14.5 %   Platelets 600 (H) 150 - 440 K/uL    Comment: Performed at [facility], [address] [place],  [address]  Comprehensive metabolic panel     Status: Abnormal   Collection Time: 12/27/17  5:33 AM  Result Value Ref Range   Sodium 139  135 - 145 mmol/L   Potassium 3.0 (L) 3.5 -  5.1 mmol/L   Chloride 102 101 - 111 mmol/L   CO2 26 22 - 32 mmol/L   Glucose, Bld 124 (H) 65 - 99 mg/dL   BUN 12 6 - 20 mg/dL   Creatinine, Ser 0.97 0.44 - 1.00 mg/dL   Calcium 8.5 (L) 8.9 - 10.3 mg/dL   Total Protein 8.3 (H) 6.5 - 8.1 g/dL   Albumin 2.3 (L) 3.5 - 5.0 g/dL   AST 16 15 - 41 U/L   ALT 9 (L) 14 - 54 U/L   Alkaline Phosphatase 51 38 - 126 U/L   Total Bilirubin 0.6 0.3 - 1.2 mg/dL   GFR calc non Af Amer 50 (L) >60 mL/min   GFR calc Af Amer 58 (L) >60 mL/min    Comment: (NOTE) The eGFR has been calculated using the CKD EPI equation. This calculation has not been validated in all clinical situations. eGFR's persistently <60 mL/min signify possible Chronic Kidney Disease.    Anion gap 11 5 - 15    Comment: Performed at [facility], [address] [place], [place] [address]  Magnesium     Status: Abnormal   Collection Time: 12/27/17  5:33 AM  Result Value Ref Range   Magnesium 1.6 (L) 1.7 - 2.4 mg/dL    Comment: Performed at [facility], [address] [place], [place] [address]  Glucose, capillary     Status: Abnormal   Collection Time: 12/27/17  7:57 AM  Result Value Ref Range   Glucose-Capillary 129 (H) 65 - 99 mg/dL  Vancomycin, trough     Status: None   Collection Time: 12/27/17  9:45 AM  Result Value Ref Range   Vancomycin Tr 20 15 - 20 ug/mL    Comment: Performed at [facility], [address] [place], [place] [address]  Glucose, capillary     Status: Abnormal   Collection Time: 12/27/17 12:10 PM  Result Value Ref Range   Glucose-Capillary 109 (H) 65 - 99 mg/dL  Glucose, capillary     Status: Abnormal   Collection Time: 12/27/17  9:26 PM  Result Value Ref Range   Glucose-Capillary 255 (H) 65 - 99 mg/dL   Comment 1 Notify RN   Glucose, capillary     Status: Abnormal   Collection Time: 12/28/17  8:05 AM  Result Value Ref Range   Glucose-Capillary 121 (H) 65 - 99 mg/dL    Vancomycin, trough     Status: Abnormal   Collection Time: 12/28/17  8:47 AM  Result Value Ref Range   Vancomycin Tr 14 (L) 15 - 20 ug/mL    Comment: Performed at [facility], [address] [place], [place] [address]  Potassium     Status: Abnormal   Collection Time: 12/28/17  8:47 AM  Result Value Ref Range   Potassium 2.9 (L) 3.5 - 5.1 mmol/L    Comment: Performed at [facility] [address] [place],  [address]    Radiology No results found.  Assessment/Plan Diabetes mellitus without complication blood glucose control important in reducing the progression of atherosclerotic disease. Also, involved in wound healing. On appropriate medications.   Hypertension blood pressure control important in reducing the progression of atherosclerotic disease. On appropriate oral medications.   Atherosclerosis of artery of extremity with ulceration ([place]) Her noninvasive studies today demonstrate ABIs of 0.68 on the right which is stable and 0.88 on the left which is also stable to slightly improved.  Her left leg waveforms are quite  good. Overall she is doing fairly well.  We will stretch out her follow-up to about 6 months unless worsening problems develop in the interim.    Leotis Pain, MD  03/23/2018 12:50 PM    This note was created with Dragon medical transcription system.  Any errors from dictation are purely unintentional

## 2018-03-23 ENCOUNTER — Encounter: Payer: Medicare Other | Admitting: Internal Medicine

## 2018-03-23 DIAGNOSIS — E11622 Type 2 diabetes mellitus with other skin ulcer: Secondary | ICD-10-CM | POA: Diagnosis not present

## 2018-03-23 NOTE — Assessment & Plan Note (Signed)
Her noninvasive studies today demonstrate ABIs of 0.68 on the right which is stable and 0.88 on the left which is also stable to slightly improved.  Her left leg waveforms are quite good. Overall she is doing fairly well.  We will stretch out her follow-up to about 6 months unless worsening problems develop in the interim.

## 2018-03-23 NOTE — Patient Instructions (Signed)
Peripheral Vascular Disease Peripheral vascular disease (PVD) is a disease of the blood vessels that are not part of your heart and brain. A simple term for PVD is poor circulation. In most cases, PVD narrows the blood vessels that carry blood from your heart to the rest of your body. This can result in a decreased supply of blood to your arms, legs, and internal organs, like your stomach or kidneys. However, it most often affects a person's lower legs and feet. There are two types of PVD.  Organic PVD. This is the more common type. It is caused by damage to the structure of blood vessels.  Functional PVD. This is caused by conditions that make blood vessels contract and tighten (spasm).  Without treatment, PVD tends to get worse over time. PVD can also lead to acute ischemic limb. This is when an arm or limb suddenly has trouble getting enough blood. This is a medical emergency. What are the causes? Each type of PVD has many different causes. The most common cause of PVD is buildup of a fatty material (plaque) inside of your arteries (atherosclerosis). Small amounts of plaque can break off from the walls of the blood vessels and become lodged in a smaller artery. This blocks blood flow and can cause acute ischemic limb. Other common causes of PVD include:  Blood clots that form inside of blood vessels.  Injuries to blood vessels.  Diseases that cause inflammation of blood vessels or cause blood vessel spasms.  Health behaviors and health history that increase your risk of developing PVD.  What increases the risk? You may have a greater risk of PVD if you:  Have a family history of PVD.  Have certain medical conditions, including: ? High cholesterol. ? Diabetes. ? High blood pressure (hypertension). ? Coronary heart disease. ? Past problems with blood clots. ? Past injury, such as burns or a broken bone. These may have damaged blood vessels in your limbs. ? Buerger disease. This is  caused by inflamed blood vessels in your hands and feet. ? Some forms of arthritis. ? Rare birth defects that affect the arteries in your legs.  Use tobacco.  Do not get enough exercise.  Are obese.  Are age 50 or older.  What are the signs or symptoms? PVD may cause many different symptoms. Your symptoms depend on what part of your body is not getting enough blood. Some common signs and symptoms include:  Cramps in your lower legs. This may be a symptom of poor leg circulation (claudication).  Pain and weakness in your legs while you are physically active that goes away when you rest (intermittent claudication).  Leg pain when at rest.  Leg numbness, tingling, or weakness.  Coldness in a leg or foot, especially when compared with the other leg.  Skin or hair changes. These can include: ? Hair loss. ? Shiny skin. ? Pale or bluish skin. ? Thick toenails.  Inability to get or maintain an erection (erectile dysfunction).  People with PVD are more prone to developing ulcers and sores on their toes, feet, or legs. These may take longer than normal to heal. How is this diagnosed? Your health care provider may diagnose PVD from your signs and symptoms. The health care provider will also do a physical exam. You may have tests to find out what is causing your PVD and determine its severity. Tests may include:  Blood pressure recordings from your arms and legs and measurements of the strength of your pulses (  pulse volume recordings).  Imaging studies using sound waves to take pictures of the blood flow through your blood vessels (Doppler ultrasound).  Injecting a dye into your blood vessels before having imaging studies using: ? X-rays (angiogram or arteriogram). ? Computer-generated X-rays (CT angiogram). ? A powerful electromagnetic field and a computer (magnetic resonance angiogram or MRA).  How is this treated? Treatment for PVD depends on the cause of your condition and the  severity of your symptoms. It also depends on your age. Underlying causes need to be treated and controlled. These include long-lasting (chronic) conditions, such as diabetes, high cholesterol, and high blood pressure. You may need to first try making lifestyle changes and taking medicines. Surgery may be needed if these do not work. Lifestyle changes may include:  Quitting smoking.  Exercising regularly.  Following a low-fat, low-cholesterol diet.  Medicines may include:  Blood thinners to prevent blood clots.  Medicines to improve blood flow.  Medicines to improve your blood cholesterol levels.  Surgical procedures may include:  A procedure that uses an inflated balloon to open a blocked artery and improve blood flow (angioplasty).  A procedure to put in a tube (stent) to keep a blocked artery open (stent implant).  Surgery to reroute blood flow around a blocked artery (peripheral bypass surgery).  Surgery to remove dead tissue from an infected wound on the affected limb.  Amputation. This is surgical removal of the affected limb. This may be necessary in cases of acute ischemic limb that are not improved through medical or surgical treatments.  Follow these instructions at home:  Take medicines only as directed by your health care provider.  Do not use any tobacco products, including cigarettes, chewing tobacco, or electronic cigarettes. If you need help quitting, ask your health care provider.  Lose weight if you are overweight, and maintain a healthy weight as directed by your health care provider.  Eat a diet that is low in fat and cholesterol. If you need help, ask your health care provider.  Exercise regularly. Ask your health care provider to suggest some good activities for you.  Use compression stockings or other mechanical devices as directed by your health care provider.  Take good care of your feet. ? Wear comfortable shoes that fit well. ? Check your feet  often for any cuts or sores. Contact a health care provider if:  You have cramps in your legs while walking.  You have leg pain when you are at rest.  You have coldness in a leg or foot.  Your skin changes.  You have erectile dysfunction.  You have cuts or sores on your feet that are not healing. Get help right away if:  Your arm or leg turns cold and blue.  Your arms or legs become red, warm, swollen, painful, or numb.  You have chest pain or trouble breathing.  You suddenly have weakness in your face, arm, or leg.  You become very confused or lose the ability to speak.  You suddenly have a very bad headache or lose your vision. This information is not intended to replace advice given to you by your health care provider. Make sure you discuss any questions you have with your health care provider. Document Released: 12/24/2004 Document Revised: 04/23/2016 Document Reviewed: 04/26/2014 [employer] Interactive Patient Education  2017 [employer]  

## 2018-03-27 NOTE — Progress Notes (Signed)
FORTUNE, TOROSIAN ([id number]) Visit Report for 03/23/2018 Arrival Information Details Patient Name: [name], [name]. Date of Service: 03/23/2018 1:15 PM Medical Record Number: [id number] Patient Account Number: [id number] Date of Birth/Sex: 1926/08/09 (84 y.o. F) Treating RN: Roger Shelter Primary Care Goldman Birchall: Lucianne Lei Other Clinician: Referring Haytham Maher: Lucianne Lei Treating Quindell Shere/Extender: Tito Dine in Treatment: 70 Visit Information History Since Last Visit All ordered tests and consults were completed: No Patient Arrived: Walker Added or deleted any medications: No Arrival Time: 13:00 Any new allergies or adverse reactions: No Accompanied By: daughter Had a fall or experienced change in No Transfer Assistance: None activities of daily living that may affect Patient Identification Verified: Yes risk of falls: Secondary Verification Process Yes Signs or symptoms of abuse/neglect since last visito No Completed: Hospitalized since last visit: No Patient Requires Transmission-Based No Implantable device outside of the clinic excluding No Precautions: cellular tissue based products placed in the center Patient Has Alerts: Yes since last visit: Patient Alerts: 01/04/2018 ABI @ Pain Present Now: No AVVS (R) 0.69 (L) 0.81 Electronic Signature(s) Signed: 03/23/2018 4:28:16 PM By: Roger Shelter Entered By: Roger Shelter on 03/23/2018 13:04:08 [place], YJEHUDJS [name]. ([id number]) -------------------------------------------------------------------------------- Clinic Level of Care Assessment Details Patient Name: Alexa Lowes [name]. Date of Service: 03/23/2018 1:15 PM Medical Record Number: [id number] Patient Account Number: [id number] Date of Birth/Sex: January 02, 1926 (84 y.o. F) Treating RN: Cornell Barman Primary Care Shin Lamour: Lucianne Lei Other Clinician: Referring Benson Porcaro: Lucianne Lei Treating Fayette Gasner/Extender: Tito Dine in Treatment:  42 Clinic Level of Care Assessment Items TOOL 4 Quantity Score []  - Use when only an EandM is performed on FOLLOW-UP visit 0 ASSESSMENTS - Nursing Assessment / Reassessment []  - Reassessment of Co-morbidities (includes updates in patient status) 0 X- 1 5 Reassessment of Adherence to Treatment Plan ASSESSMENTS - Wound and Skin Assessment / Reassessment X - Simple Wound Assessment / Reassessment - one wound 1 5 []  - 0 Complex Wound Assessment / Reassessment - multiple wounds []  - 0 Dermatologic / Skin Assessment (not related to wound area) ASSESSMENTS - Focused Assessment []  - Circumferential Edema Measurements - multi extremities 0 []  - 0 Nutritional Assessment / Counseling / Intervention []  - 0 Lower Extremity Assessment (monofilament, tuning fork, pulses) []  - 0 Peripheral Arterial Disease Assessment (using hand held doppler) ASSESSMENTS - Ostomy and/or Continence Assessment and Care []  - Incontinence Assessment and Management 0 []  - 0 Ostomy Care Assessment and Management (repouching, etc.) PROCESS - Coordination of Care X - Simple Patient / Family Education for ongoing care 1 15 []  - 0 Complex (extensive) Patient / Family Education for ongoing care []  - 0 Staff obtains Programmer, systems, Records, Test Results / Process Orders []  - 0 Staff telephones HHA, Nursing Homes / Clarify orders / etc []  - 0 Routine Transfer to another Facility (non-emergent condition) []  - 0 Routine Hospital Admission (non-emergent condition) []  - 0 New Admissions / Biomedical engineer / Ordering NPWT, Apligraf, etc. []  - 0 Emergency Hospital Admission (emergent condition) X- 1 10 Simple Discharge Coordination [name], [name]. ([id number]) []  - 0 Complex (extensive) Discharge Coordination PROCESS - Special Needs []  - Pediatric / Minor Patient Management 0 []  - 0 Isolation Patient Management []  - 0 Hearing / Language / Visual special needs []  - 0 Assessment of Community assistance  (transportation, D/C planning, etc.) []  - 0 Additional assistance / Altered mentation []  - 0 Support Surface(s) Assessment (bed, cushion, seat, etc.) INTERVENTIONS - Wound Cleansing / Measurement X - Simple Wound Cleansing -  one wound 1 5 []  - 0 Complex Wound Cleansing - multiple wounds X- 1 5 Wound Imaging (photographs - any number of wounds) []  - 0 Wound Tracing (instead of photographs) X- 1 5 Simple Wound Measurement - one wound []  - 0 Complex Wound Measurement - multiple wounds INTERVENTIONS - Wound Dressings []  - Small Wound Dressing one or multiple wounds 0 X- 1 15 Medium Wound Dressing one or multiple wounds []  - 0 Large Wound Dressing one or multiple wounds []  - 0 Application of Medications - topical []  - 0 Application of Medications - injection INTERVENTIONS - Miscellaneous []  - External ear exam 0 []  - 0 Specimen Collection (cultures, biopsies, blood, body fluids, etc.) []  - 0 Specimen(s) / Culture(s) sent or taken to Lab for analysis []  - 0 Patient Transfer (multiple staff / Civil Service fast streamer / Similar devices) []  - 0 Simple Staple / Suture removal (25 or less) []  - 0 Complex Staple / Suture removal (26 or more) []  - 0 Hypo / Hyperglycemic Management (close monitor of Blood Glucose) []  - 0 Ankle / Brachial Index (ABI) - do not check if billed separately X- 1 5 Vital Signs [name], [name]. ([id number]) Has the patient been seen at the hospital within the last three years: Yes Total Score: 70 Level Of Care: New/Established - Level 2 Electronic Signature(s) Signed: 03/23/2018 8:51:29 PM By: Gretta Cool, BSN, RN, CWS, Kim RN, BSN Entered By: Gretta Cool, BSN, RN, CWS, Kim on 03/23/2018 13:52:08 [name], [name] ([id number]) -------------------------------------------------------------------------------- Encounter Discharge Information Details Patient Name: Alexa Lowes [name]. Date of Service: 03/23/2018 1:15 PM Medical Record Number: [id number] Patient Account  Number: [id number] Date of Birth/Sex: [birth year] (84 y.o. F) Treating RN: Cornell Barman Primary Care Walker Sitar: Lucianne Lei Other Clinician: Referring Lindsi Bayliss: Lucianne Lei Treating Xaria Judon/Extender: Tito Dine in Treatment: 38 Encounter Discharge Information Items Discharge Pain Level: 0 Discharge Condition: Stable Ambulatory Status: Walker Discharge Destination: Home Transportation: Private Auto Accompanied By: grand daughter Schedule Follow-up Appointment: Yes Medication Reconciliation completed and No provided to Patient/Care Kassadee Carawan: Provided on Clinical Summary of Care: 03/23/2018 Form Type Recipient Paper Patient Rogers Signature(s) Signed: 03/23/2018 4:28:16 PM By: Roger Shelter Entered By: Roger Shelter on 03/23/2018 14:07:00 [name], [name]. ([id number]) -------------------------------------------------------------------------------- Lower Extremity Assessment Details Patient Name: Alexa Lowes [name]. Date of Service: 03/23/2018 1:15 PM Medical Record Number: [id number] Patient Account Number: [id number] Date of Birth/Sex: 07/24/1926 (84 y.o. F) Treating RN: Roger Shelter Primary Care Zackari Ruane: Lucianne Lei Other Clinician: Referring Josilyn Shippee: Lucianne Lei Treating Antaeus Karel/Extender: Tito Dine in Treatment: 42 Edema Assessment Assessed: [Left: No] [Right: No] Edema: [Left: N] [Right: o] Vascular Assessment Claudication: Claudication Assessment [Left:None] Pulses: Posterior Tibial Extremity colors, hair growth, and conditions: Extremity Color: [Left:Hyperpigmented] Hair Growth on Extremity: [Left:No] Temperature of Extremity: [Left:Warm] Electronic Signature(s) Signed: 03/23/2018 4:28:16 PM By: Roger Shelter Entered By: Roger Shelter on 03/23/2018 13:10:17 Hice, KXFGHWEX [name]. ([id number]) -------------------------------------------------------------------------------- Multi Wound Chart Details Patient Name:  Alexa Lowes [name]. Date of Service: 03/23/2018 1:15 PM Medical Record Number: [id number] Patient Account Number: [id number] Date of Birth/Sex: 08/24/1926 (84 y.o. F) Treating RN: Cornell Barman Primary Care Shakora Nordquist: Lucianne Lei Other Clinician: Referring Javis Abboud: Lucianne Lei Treating Varun Jourdan/Extender: Tito Dine in Treatment: 42 Vital Signs Height(in): 65 Pulse(bpm): 92 Weight(lbs): 131 Blood Pressure(mmHg): 143/65 Body Mass Index(BMI): 22 Temperature(F): 97.5 Respiratory Rate 18 (breaths/min): Photos: [N/A:N/A] Wound Location: Left Lower Leg - Anterior N/A N/A Wounding Event: Trauma N/A N/A Primary Etiology: Diabetic Wound/Ulcer of the N/A N/A Lower Extremity Secondary  Etiology: Trauma, Other N/A N/A Comorbid History: Anemia, Hypertension, Type II N/A N/A Diabetes, Osteoarthritis Date Acquired: 05/02/2017 N/A N/A Weeks of Treatment: 42 N/A N/A Wound Status: Open N/A N/A Measurements L x W x D 2x0.8x0.2 N/A N/A (cm) Area (cm) : 1.257 N/A N/A Volume (cm) : 0.251 N/A N/A % Reduction in Area: 73.90% N/A N/A % Reduction in Volume: 73.90% N/A N/A Classification: Grade 2 N/A N/A Exudate Amount: Medium N/A N/A Exudate Type: Serosanguineous N/A N/A Exudate Color: red, brown N/A N/A Wound Margin: Flat and Intact N/A N/A Granulation Amount: Large (67-100%) N/A N/A Granulation Quality: Red, Pink, Hyper-granulation N/A N/A Necrotic Amount: Small (1-33%) N/A N/A Necrotic Tissue: Eschar, Adherent Slough N/A N/A Exposed Structures: Fascia: Yes N/A N/A Fat Layer (Subcutaneous Tissue) Exposed: Yes Muscle: Yes [name], [name]. ([id number]) Tendon: No Joint: No Bone: No Epithelialization: Small (1-33%) N/A N/A Periwound Skin Texture: Scarring: Yes N/A N/A Excoriation: No Induration: No Callus: No Crepitus: No Rash: No Periwound Skin Moisture: Maceration: Yes N/A N/A Dry/Scaly: No Periwound Skin Color: Hemosiderin Staining: Yes N/A N/A Atrophie  Blanche: No Cyanosis: No Ecchymosis: No Erythema: No Mottled: No Pallor: No Rubor: No Temperature: No Abnormality N/A N/A Tenderness on Palpation: Yes N/A N/A Wound Preparation: Ulcer Cleansing: N/A N/A Rinsed/Irrigated with Saline Topical Anesthetic Applied: Other: lidocaine 4% Treatment Notes Wound #1 (Left, Anterior Lower Leg) 1. Cleansed with: Clean wound with Normal Saline 2. Anesthetic Topical Lidocaine 4% cream to wound bed prior to debridement 3. Peri-wound Care: Moisturizing lotion 4. Dressing Applied: Hydrafera Blue 5. Secondary Dressing Applied ABD Pad Notes kerlix and coban light wrap with tape, secured with netting Electronic Signature(s) Signed: 03/23/2018 5:40:08 PM By: Linton Ham MD Entered By: Linton Ham on 03/23/2018 14:25:12 [name], [name] ([id number]) -------------------------------------------------------------------------------- Multi-Disciplinary Care Plan Details Patient Name: Alexa Lowes [name]. Date of Service: 03/23/2018 1:15 PM Medical Record Number: [id number] Patient Account Number: [id number] Date of Birth/Sex: [birth year] (84 y.o. F) Treating RN: Cornell Barman Primary Care Manasseh Pittsley: Lucianne Lei Other Clinician: Referring Annamaria Salah: Lucianne Lei Treating Kaleth Koy/Extender: Tito Dine in Treatment: 8 Active Inactive ` Abuse / Safety / Falls / Self Care Management Nursing Diagnoses: Potential for falls Goals: Patient will remain injury free related to falls Date Initiated: 05/28/2017 Target Resolution Date: 07/23/2017 Goal Status: Active Interventions: Assess fall risk on admission and as needed Notes: ` Orientation to the Wound Care Program Nursing Diagnoses: Knowledge deficit related to the wound healing center program Goals: Patient/caregiver will verbalize understanding of the [facility] Date Initiated: 05/28/2017 Target Resolution Date: 07/23/2017 Goal Status:  Active Interventions: Provide education on orientation to the wound center Notes: ` Wound/Skin Impairment Nursing Diagnoses: Impaired tissue integrity Goals: Ulcer/skin breakdown will have a volume reduction of 30% by week 4 Date Initiated: 05/28/2017 Target Resolution Date: 07/23/2017 Goal Status: Active Ulcer/skin breakdown will have a volume reduction of 50% by week 8 Date Initiated: 05/28/2017 Target Resolution Date: 07/23/2017 [name], [name] ([id number]) Goal Status: Active Ulcer/skin breakdown will have a volume reduction of 80% by week 12 Date Initiated: 05/28/2017 Target Resolution Date: 07/23/2017 Goal Status: Active Ulcer/skin breakdown will heal within 14 weeks Date Initiated: 05/28/2017 Target Resolution Date: 07/23/2017 Goal Status: Active Interventions: Assess patient/caregiver ability to obtain necessary supplies Assess patient/caregiver ability to perform ulcer/skin care regimen upon admission and as needed Assess ulceration(s) every visit Notes: Electronic Signature(s) Signed: 03/23/2018 8:51:29 PM By: Gretta Cool, BSN, RN, CWS, Kim RN, BSN Entered By: Gretta Cool, BSN, RN, CWS, Kim on 03/23/2018 13:48:32 [name],  [name]. ([id number]) -------------------------------------------------------------------------------- Pain Assessment Details Patient Name: [name], [name]. Date of Service: 03/23/2018 1:15 PM Medical Record Number: [id number] Patient Account Number: [id number] Date of Birth/Sex: 29-Jun-1926 (84 y.o. F) Treating RN: Roger Shelter Primary Care Saheed Carrington: Lucianne Lei Other Clinician: Referring Madelynne Lasker: Lucianne Lei Treating Reta Norgren/Extender: Tito Dine in Treatment: 53 Active Problems Location of Pain Severity and Description of Pain Patient Has Paino No Site Locations Pain Management and Medication Current Pain Management: Electronic Signature(s) Signed: 03/23/2018 4:28:16 PM By: Roger Shelter Entered By: Roger Shelter on  03/23/2018 13:04:15 [name], [name] ([id number]) -------------------------------------------------------------------------------- Patient/Caregiver Education Details Patient Name: Alexa Lowes [name]. Date of Service: 03/23/2018 1:15 PM Medical Record Number: [id number] Patient Account Number: [id number] Date of Birth/Gender: 07-26-1926 (84 y.o. F) Treating RN: Roger Shelter Primary Care Physician: Lucianne Lei Other Clinician: Referring Physician: Lucianne Lei Treating Physician/Extender: Tito Dine in Treatment: 76 Education Assessment Education Provided To: Patient Education Topics Provided Wound Debridement: Handouts: Wound Debridement Methods: Explain/Verbal Responses: State content correctly Wound/Skin Impairment: Handouts: Caring for Your Ulcer Methods: Explain/Verbal Responses: State content correctly Electronic Signature(s) Signed: 03/23/2018 4:28:16 PM By: Roger Shelter Entered By: Roger Shelter on 03/23/2018 14:07:22 [name], [name]. ([id number]) -------------------------------------------------------------------------------- Wound Assessment Details Patient Name: Alexa Lowes [name]. Date of Service: 03/23/2018 1:15 PM Medical Record Number: [id number] Patient Account Number: [id number] Date of Birth/Sex: [birth year] (84 y.o. F) Treating RN: Roger Shelter Primary Care Sharlon Pfohl: Lucianne Lei Other Clinician: Referring Karrigan Messamore: Lucianne Lei Treating Gerald Honea/Extender: Tito Dine in Treatment: 42 Wound Status Wound Number: 1 Primary Etiology: Diabetic Wound/Ulcer of the Lower Extremity Wound Location: Left Lower Leg - Anterior Secondary Trauma, Other Wounding Event: Trauma Etiology: Date Acquired: 05/02/2017 Wound Status: Open Weeks Of Treatment: 42 Comorbid Anemia, Hypertension, Type II Diabetes, Clustered Wound: No History: Osteoarthritis Photos Photo Uploaded By: Montey Hora on 03/23/2018 14:04:48 Wound  Measurements Length: (cm) 2 Width: (cm) 0.8 Depth: (cm) 0.2 Area: (cm) 1.257 Volume: (cm) 0.251 % Reduction in Area: 73.9% % Reduction in Volume: 73.9% Epithelialization: Small (1-33%) Tunneling: No Undermining: No Wound Description Classification: Grade 2 Wound Margin: Flat and Intact Exudate Amount: Medium Exudate Type: Serosanguineous Exudate Color: red, brown Foul Odor After Cleansing: No Slough/Fibrino Yes Wound Bed Granulation Amount: Large (67-100%) Exposed Structure Granulation Quality: Red, Pink, Hyper-granulation Fascia Exposed: Yes Necrotic Amount: Small (1-33%) Fat Layer (Subcutaneous Tissue) Exposed: Yes Necrotic Quality: Eschar, Adherent Slough Tendon Exposed: No Muscle Exposed: Yes Necrosis of Muscle: No Joint Exposed: No Bone Exposed: No Lantzy, Kadejah [name]. ([id number]) Periwound Skin Texture Texture Color No Abnormalities Noted: No No Abnormalities Noted: No Callus: No Atrophie Blanche: No Crepitus: No Cyanosis: No Excoriation: No Ecchymosis: No Induration: No Erythema: No Rash: No Hemosiderin Staining: Yes Scarring: Yes Mottled: No Pallor: No Moisture Rubor: No No Abnormalities Noted: No Dry / Scaly: No Temperature / Pain Maceration: Yes Temperature: No Abnormality Tenderness on Palpation: Yes Wound Preparation Ulcer Cleansing: Rinsed/Irrigated with Saline Topical Anesthetic Applied: Other: lidocaine 4%, Treatment Notes Wound #1 (Left, Anterior Lower Leg) 1. Cleansed with: Clean wound with Normal Saline 2. Anesthetic Topical Lidocaine 4% cream to wound bed prior to debridement 3. Peri-wound Care: Moisturizing lotion 4. Dressing Applied: Hydrafera Blue 5. Secondary Dressing Applied ABD Pad Notes kerlix and coban light wrap with tape, secured with netting Electronic Signature(s) Signed: 03/23/2018 4:28:16 PM By: Roger Shelter Entered By: Roger Shelter on 03/23/2018 13:09:36 Kidd, Tinya [name].  ([id number]) -------------------------------------------------------------------------------- Vitals Details Patient Name: Alexa Lowes [name]. Date of Service: 03/23/2018 1:15 PM Medical  Record Number: [id number] Patient Account Number: [id number] Date of Birth/Sex: 1926-06-10 (84 y.o. F) Treating RN: Roger Shelter Primary Care Zakiya Sporrer: Lucianne Lei Other Clinician: Referring Kassim Guertin: Lucianne Lei Treating Krysteena Stalker/Extender: Tito Dine in Treatment: 42 Vital Signs Time Taken: 13:04 Temperature (F): 97.5 Height (in): 65 Pulse (bpm): 92 Weight (lbs): 131 Respiratory Rate (breaths/min): 18 Body Mass Index (BMI): 21.8 Blood Pressure (mmHg): 143/65 Reference Range: 80 - 120 mg / dl Electronic Signature(s) Signed: 03/23/2018 4:28:16 PM By: Roger Shelter Entered By: Roger Shelter on 03/23/2018 13:04:34

## 2018-03-27 NOTE — Progress Notes (Signed)
GENNIE, EISINGER ([id number]) Visit Report for 03/23/2018 HPI Details Patient Name: [name], [name] Date of Service: 03/23/2018 1:15 PM Medical Record Number: [id number] Patient Account Number: [id number] Date of Birth/Sex: 1926/06/03 (84 y.o. F) Treating RN: [name] Primary Care Provider: Lucianne [name] Other Clinician: Referring Provider: Lucianne [name] Treating Provider/Extender: [name] in Treatment: 13 History of Present Illness HPI Description: 84 year old patient was seen in the ED about 10 days ago for a history of abrasion to the left lower extremity while she was boarding a bus and scraped the anterior part of her left leg. She has been a diabetic for many years and has been taking treatment regularly. Her past medical history is also significant for anemia arthritis constipation and hypertension and is status post abdominal hysterectomy. She has never been a smoker. after her ER visit she was advised to apply Bactroban ointment to the wound twice a day and continue to monitor her blood glucose levels. her last hemoglobin A1c was done 3 years ago and was 6.6% 06/04/17 patient left anterior shin wound appears to be doing well although it is still somewhat dry despite the treatment with Medihoney. We have been using Kerlex following the Medihoney application and I believe this is just not retaining that much moisture. Nonetheless there is no evidence of infection. 06/11/17 on evaluation today patient appears to be doing a little bit worse in regard to her wound. The entirety of the wound is dry and unfortunately the Medihoney does not seem to be helping this. That is even with the dressing changes that I made last week to try to retain more moisture. She has not tried [employer] as of yet. She was also noncompressible when testing for ABIs. 07/01/2017 -- she had a arterial studies done and was seen by [name]. her noninvasive studies showed noncompressible vessels on  the right but brisk waveforms and digital pressures of 71 on the right consistent with only mild arterial insufficienc. On the left her ABI was 0.47 and this may be falsely elevated due to calcification. No digital pressures were obtained on the left and this is consistent with severe arterial insufficiency. this critical limb threatening situation on the left, would make wound healing difficult and it represents a serious situation and he has recommended an angiography with possible revascularization. The patient desired to defer the decision to she discuss with the family members. 07/09/17 on evaluation today patient appears to be doing fairly well in regard to her left anterior lower extremity wound. She has been tolerating the dressing changes without complication we have been utilizing Santyl at this time. She tells me that she is having no significant pain compared to what she has had in the past. She has decided after discussing with family that she is going to go forward with the surgery with [name]. This is to restore better blood flow to left lower extremity. With that it does appear that her lower extremity wound is making some progress albeit slow. No fevers, chills, nausea, or vomiting noted at this time. 07/16/17 on evaluation today patient's wound appears to be doing roughly about the same although some of the slough is clearing off. [name] she has her appointment with vascular next week on Thursday the 23rd. With that being said her wound does not appear to be hurting as badly as it has in the past which is good news. No fevers, chills, nausea, or vomiting noted at this time. 07/30/2017 -- she had surgery on  07/22/2017 --indications being nonhealing ulcer on the left leg with a noninvasive study showing marked reduction in ABI of less than 0.5 and no digital pressure on her left leg. she had a percutaneous transluminal angioplasty of the left common and external iliac arteries. She then  had a stent placed to the left external iliac arteries and to the left common iliac artery. 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 The flow distally was poor and there were multiple areas of high-grade stenosis or occlusion of the distal SFA and the popliteal artery with only a diseased peroneal artery as runoff distally. She has a postop appointment on September 23 08/20/2017 -- she went back to the vascular office for an appointment on 08/12/2017 and bilateral ABIs were done which [name], [name] ([id number]) were notable for moderate right lower extremity arterial disease and unable to obtain left ankle brachial indices due to absent Doppler velocities in the posterior tibial artery and the anterior tibial artery. Monophasic waveforms to the left femoral and popliteal artery. this was worrisome for a failure of stents placed and lower extremity angiogram was recommended. as was done on 08/16/2017 and the stents placed in the left eye Lex system had some mild narrowing at the iliac bifurcation and the leading edge of the stent proximally was poor wall apposition with stenosis in the 50-60% range in the right common iliac artery. There was also occlusion of the common femoral artery and the origin of the SFA and profunda femoris artery. Flow distally was poor but they appeared to be multiple areas of high-grade stenosis or occlusion in the distal SFA and popliteal artery with only a diseased peroneal artery. in another stent was placed and deployed. The femoral occlusion would need to be treated surgically. 08/27/17 on evaluation today patient appears to be doing about the same in regard to her left for extremity wound. Unfortunately secondary to her vascular status this is not a very well healing wound at this time. She has been tolerating the dressing changes she has some discomfort but mainly with  cleansing of the wound not at other times. No fevers, chills, nausea, or vomiting noted at this time. 09/17/2017 -- the patient was recently discharged from the hospital where she was admitted between 09/08/2017 and 09/10/2017, when she had a procedure on her left femoral artery with the endarterectomy and a patch. she will be going back for review this coming week. 09/24/2017 -- was seen on 09/18/2017 in the ER, for bleeding from her wound which was debrided earlier during the day. At the time she was seen there was no active bleeding. A thrombin pad was placed over the area and she was asked to change the outside dressing but come back to the wound center as planned. she was also seen at the [facility] office for a review after her surgery and bilateral lower extremity ABI when compared to the previous ones showed minimal improvement of arterial blood flow. She was set up for a CT angiogram of the abdomen and pelvis and lower extremities to see the degree of peripheral arterial disease 10/28/2017 -- had a x-ray of the left tibia and fibula done on 10/07/2017 -- showed no bony acute abnormality she was reviewed by Dr. Corene Cornea [name] on 10/26/2017, and he reviewed her CT angiogram and this demonstrated a widely patent left femoral endarterectomy including the origins of the femoral artery.  She also had moderate distal disease in the popliteal artery, distal SFA and tibial disease. Her peroneal artery is a dominant runoff distally.he felt she had adequate perfusion for healing the mid calf level and he is putting off all surgical intervention for now and is going to see her back in 2-3 months for noninvasive studies. She also has a new wound on the plantar aspect of the left big toe where she pulled a piece of skin and has caused a superficial ulceration 11/19/17 on evaluation today patient appears to be doing decently well at this point in time. It does appear that her  Wound VAC has been approved as ordered by Dr. Con Memos however she has not received that as of yet. Nonetheless the wound bed in general appears to have minimal slough noted at this time in a fairly good amount of granulation although there is to be a noted in the center of the wound. No fevers, chills, nausea, or vomiting noted at this time. 12/02/17 on evaluation today patient appears to be doing about the same in regard to her left anterior shin wound. Unfortunately her left toe wound appears to still be doing about the same as far as the eschar covering. This has softened up a bit to the point that it is now loose and could have selective debridement which would allow for the Santyl to work better. Fortunately she is having no pain. 12/10/17 on evaluation today patient appears to be doing fairly well in regard to her left anterior shin wound and that it at least appears to be stable. With that being said she does have really no significant healing at this point this wound seems to be mainly just maintaining. Subsequently patient also has the left toe wound which does not seem to be progressing. There is still nonviable tissue overlying the surface of the wound and this doesn't even seem to be loosening up very well at all. And patient has been using the Santyl. I do think this may need selective debridement today. Fortunately there does not appear to be any evidence of infection although I am concerned about the possibility that patient's blood flow is not likely sufficient to be able to tolerate appropriate wound healing. This was discussed with patient today as well although obviously I would leave the determination up to vascular surgery. She will be seeing [name] on February 5 for repeat vascular studies and then in office evaluation. 12/17/17 on evaluation today patient appears to be doing about the same in regard to her left shin and left great toe ulcers. She has been performing the dressing  changes as previously recommended over the past week. With that being said unfortunately nothing seems to be changing much in regard to the dimensions of the wound and I do feel again that this may Loredo, Naijah P. ([id number]) be related to vascular flow. Patient's daughter was present during the evaluation today and we did discuss this as well. I will be detailed in greater detail in the plan. 12/23/17 she is here in follow up evaluation of a left great toe and left anterior leg ulcer. There is noted ischemic changes (urple, dry gangrene, shriveling effect laterally) to the left great toe, although not overtly cold. Contact was made to Dr Ozella Almond office regarding possibility of seeing her in the office today or tomorrow as this is an acute change; they recommended she go to the emergency department for evaluation by Dr Ozella Almond partner Dr Curley Spice. The ER was  contacted, spoke with Tiffany, regarding this. Patient was accompanied by her son. The left anterior shin ulcer is improved in measurement, overtly exposed bone with area of discoloration/necrosis. will follow up after hospitalization/vascular intervention 12/31/17 on evaluation today patient is status post having gone to the ER last week on 12/23/17. 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 This was performed by Dr. Cleda Mccreedy on 12/23/17. Subsequently Dr. Corene Cornea do with [facility] performed percutaneous evaluation and intervention of patient's left lower extremity on 12/27/17. Angioplasty was performed following angiogram of the left peroneal artery and tibial angioplasty was also performed of the left popliteal artery. Angioplasty of the mid and distal superficial femoral arteries was likewise undertaken. Stents were placed in the left above knee popliteal artery following angioplasty and then a second stent placed in the mid- SFA secondary to 50% residual stenosis following the angioplasty. Fortunately  patient was much better post procedure as far as stenosis/occlusions were concerned and she did have definitely much better blood flow which was noted to be evident on evaluation today. 01/07/18 on evaluation today patient appears to be doing well on her left lower extremity in regard to the anterior shin ulcer. She has been tolerating the dressing changes without complication. The good news is nothing appears to be doing worse and in general she does seem to have more granulation tissue. There is still bone exposure. Fortunately she's been tolerating the dressing changes. Patient's a the eyes that Dr. Bunnie Domino office on 01/04/18 revealed a right ABI of 0.69 and a left ABI of 0.81 obviously this is doing very well and significantly improved. 01/12/18; patient with a traumatic wound on the left anterior tibial area. Fairly substantial wound was still open tibia however the amount of exposed bone is apparently a lot better. We have applied for TheraSkins but have not yet received approval. She has been using silver collagen as mentioned everything appears better. She had recent revascularization by  vein and vascular and had a left ABI of 0.81. 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 This is still wrapped and I did not look at this today, apparently we are not following secondary to this being a recent surgical wound. 01/19/18; patient with a traumatic wound on the left anterior tibial area. Only a small amount of open tibia still remains. We applied Theraskin #1. She has an another wound at the right great toe amputation site. This is being followed by her surgeon on a 84 day global. Our nurse inadvertently remove the wet to dry dressing and reported there is exposed bone. We are not following this as of yet 02/02/18; she is back for a deep traumatic wound over the left anterior tibia. We applied the first TheraSkin 2 weeks ago. She returns today in follow-up. The wound is better  there is no exposed bone. She has home health going out they will change the superficial dressings on this in a week there also dealing with a right great toe amputation site, we are not following this as of yet 02/16/18-she is here in follow up evaluation for left lower extremity wound. Theraskin was reapplied. She saw Dr Cleda Mccreedy today who redressed her amputation site 03/02/18; patient is here for follow-up of her left lower extremity wound. We applied TheraSkin 2 weeks ago. She arrived with the wound bed completely granulated. This required debridement. Dimensions are better healthier looking wound. Nevertheless I didn't think a subsequent TheraSkin  was necessary 03/09/18; traumatic wound on the left lower extremity with initially exposed tibia. We closed this over with TheraSkin o2. We've now used Hydrofera Blue starting last week.the daughter was present is doing most of dressing changes. She seems to want to discharge home health although she has an appointment next week with regards to her orthopedic issues in her left foot so they're going to keep home health to see if therapy is required. There is apparently an open area on the left foot as well we have not seen this and haven't been following 03/23/18; traumatic wound on the left anterior lower extremity with initially with exposed tibia. This is closed over. The wound continues to contract and is smaller. Surface looks healthy. She follows with orthopedics for an area on her left foot/great toe apparently this is doing better. We haven't looked at this.they no longer have home health care. Her daughter is changing the dressing using Hydrofera Blue Electronic Signature(s) Signed: 03/23/2018 5:40:08 PM By: Linton Ham MD Entered By: Linton Ham on 03/23/2018 14:28:34 Kerri Perches, [name] ([id number]) Imhoff, [name] ([id number]) -------------------------------------------------------------------------------- Physical Exam  Details Patient Name: Alexa Lowes P. Date of Service: 03/23/2018 1:15 PM Medical Record Number: [id number] Patient Account Number: [id number] Date of Birth/Sex: 11/18/1926 (84 y.o. F) Treating RN: [name] Primary Care Provider: Lucianne [name] Other Clinician: Referring Provider: Lucianne [name] Treating Provider/Extender: [name] in Treatment: 7 Constitutional Patient is hypertensive.. Pulse regular and within target range for patient.Marland Kitchen Respirations regular, non-labored and within target range.. Temperature is normal and within the target range for the patient.Marland Kitchen appears in no distress. Eyes Conjunctivae clear. No discharge. Respiratory Respiratory effort is easy and symmetric bilaterally. Rate is normal at rest and on room air.. Cardiovascular femoral pulses palpable on the left. Musculoskeletal left foot is dressed and healing sandal.. Psychiatric No evidence of depression, anxiety, or agitation. Calm, cooperative, and communicative. Appropriate interactions and affect.. Notes wound exam; the area on the left anterior tibia looks a lot better. Smaller dimensions healthy granulation. No evidence of infection. No debridement is required Electronic Signature(s) Signed: 03/23/2018 5:40:08 PM By: Linton Ham MD Entered By: Linton Ham on 03/23/2018 14:30:35 [name], [name] ([id number]) -------------------------------------------------------------------------------- Physician Orders Details Patient Name: Alexa Lowes P. Date of Service: 03/23/2018 1:15 PM Medical Record Number: [id number] Patient Account Number: [id number] Date of Birth/Sex: 02-19-1926 (84 y.o. F) Treating RN: [name] Primary Care Provider: Lucianne [name] Other Clinician: Referring Provider: Lucianne [name] Treating Provider/Extender: [name] in Treatment: 5 Verbal / Phone Orders: No Diagnosis Coding Wound Cleansing Wound #1 Left,Anterior Lower Leg o Clean wound  with Normal Saline. o Cleanse wound with mild soap and water Anesthetic (add to Medication List) o Topical Lidocaine 4% cream applied to wound bed prior to debridement (In Clinic Only). Primary Wound Dressing Wound #1 Left,Anterior Lower Leg o Hydrafera Blue Ready Transfer Secondary Dressing o ABD and Kerlix/Conform - coban to secure Dressing Change Frequency o Other: - twice weekly Follow-up Appointments o Return Appointment in 1 week. Home Health o D/C Home Health Services Electronic Signature(s) Signed: 03/23/2018 5:40:08 PM By: Linton Ham MD Signed: 03/23/2018 8:51:29 PM By: Gretta Cool, BSN, RN, CWS, Kim RN, BSN Entered By: Gretta Cool, BSN, RN, CWS, Kim on 03/23/2018 13:50:43 [name], [name] ([id number]) -------------------------------------------------------------------------------- Problem List Details Patient Name: DARNISHA, VERNET P. Date of Service: 03/23/2018 1:15 PM Medical Record Number: [id number] Patient Account Number: [id number] Date of Birth/Sex: November 24, 1926 (84 y.o. 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 ([id number]) -------------------------------------------------------------------------------- Progress Note Details Patient Name: Alexa Lowes P. Date of Service: 03/23/2018 1:15 PM Medical Record Number: [id number] Patient Account Number: [id number] Date of Birth/Sex: 1926/02/28 (84 y.o. F) Treating RN: [name] Primary Care Provider: Lucianne [name] Other Clinician: Referring Provider: Lucianne [name] Treating Provider/Extender: [name] in Treatment: 42 Subjective History of Present Illness (HPI) 84 year old patient was seen in the ED about 10 days ago for a history of abrasion to the left lower extremity while she was boarding a bus and scraped the anterior part of her left leg. She has been a diabetic for many years and has been taking treatment regularly. Her past medical history is also significant for anemia arthritis constipation and hypertension and is status post abdominal hysterectomy. She has never been a smoker. after her ER visit she was advised to apply Bactroban ointment to the wound twice a day and continue to monitor her blood glucose levels. her last hemoglobin A1c was done 3 years ago and was 6.6% 06/04/17 patient left anterior shin wound appears to be doing well although it is still somewhat dry despite the treatment with Medihoney. We have been using Kerlex following the Medihoney application and I believe this is just not retaining that much moisture. Nonetheless there is no evidence of infection. 06/11/17 on evaluation today patient appears to be doing a little bit worse in regard to her wound. The entirety of the wound is dry and unfortunately the Medihoney does not seem to be helping this. That is even with the dressing changes that I made last week to try to retain more moisture. She has not tried [employer] as of yet. She was also noncompressible when testing for ABIs. 07/01/2017 -- she had a arterial studies done  and was seen by [name]. her noninvasive studies showed noncompressible vessels on the right but brisk waveforms and digital pressures of 71 on the right consistent with only mild arterial insufficienc. On the left her ABI was 0.47 and this may be falsely elevated due to calcification. No digital pressures were obtained on the left and this is consistent with severe arterial insufficiency. this critical limb threatening situation on the left, would make wound healing difficult and it represents a serious situation and he has recommended an angiography with possible revascularization. The patient desired to defer the decision to she discuss with the family members. 07/09/17 on evaluation today patient appears to be doing fairly well in regard to her left anterior lower extremity wound. She has been tolerating the dressing changes without complication we have been utilizing Santyl at this time. She tells me that she is having no significant pain compared to what she has had in the past. She has decided after discussing with family that she  is going to go forward with the surgery with [name]. This is to restore better blood flow to left lower extremity. With that it does appear that her lower extremity wound is making some progress albeit slow. No fevers, chills, nausea, or vomiting noted at this time. 07/16/17 on evaluation today patient's wound appears to be doing roughly about the same although some of the slough is clearing off. [name] she has her appointment with vascular next week on Thursday the 23rd. With that being said her wound does not appear to be hurting as badly as it has in the past which is good news. No fevers, chills, nausea, or vomiting noted at this time. 07/30/2017 -- she had surgery on 07/22/2017 --indications being nonhealing ulcer on the left leg with a noninvasive study showing marked reduction in ABI of less than 0.5 and no digital pressure on her left leg. she had a  percutaneous transluminal angioplasty of the left common and external iliac arteries. She then had a stent placed to the left external iliac arteries and to the left common iliac artery. 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 The flow distally was poor and there were multiple areas of high-grade stenosis or occlusion of the distal SFA and the popliteal artery with only a diseased peroneal artery as runoff distally. She has a postop appointment on September 23 08/20/2017 -- she went back to the vascular office for an appointment on 08/12/2017 and bilateral ABIs were done which were notable for moderate right lower extremity arterial disease and unable to obtain left ankle brachial indices due to absent Doppler velocities in the posterior tibial artery and the anterior tibial artery. Monophasic waveforms to the left femoral and Love, [name] ([id number]) popliteal artery. this was worrisome for a failure of stents placed and lower extremity angiogram was recommended. as was done on 08/16/2017 and the stents placed in the left eye Lex system had some mild narrowing at the iliac bifurcation and the leading edge of the stent proximally was poor wall apposition with stenosis in the 50-60% range in the right common iliac artery. There was also occlusion of the common femoral artery and the origin of the SFA and profunda femoris artery. Flow distally was poor but they appeared to be multiple areas of high-grade stenosis or occlusion in the distal SFA and popliteal artery with only a diseased peroneal artery. in another stent was placed and deployed. The femoral occlusion would need to be treated surgically. 08/27/17 on evaluation today patient appears to be doing about the same in regard to her left for extremity wound. Unfortunately secondary to her vascular status this is not a very well healing wound at this  time. She has been tolerating the dressing changes she has some discomfort but mainly with cleansing of the wound not at other times. No fevers, chills, nausea, or vomiting noted at this time. 09/17/2017 -- the patient was recently discharged from the hospital where she was admitted between 09/08/2017 and 09/10/2017, when she had a procedure on her left femoral artery with the endarterectomy and a patch. she will be going back for review this coming week. 09/24/2017 -- was seen on 09/18/2017 in the ER, for bleeding from her wound which was debrided earlier during the day. At the time she was seen there was no active bleeding. A thrombin pad was placed over the area and she was asked to change  the outside dressing but come back to the wound center as planned. she was also seen at the [facility] for a review after her surgery and bilateral lower extremity ABI when compared to the previous ones showed minimal improvement of arterial blood flow. She was set up for a CT angiogram of the abdomen and pelvis and lower extremities to see the degree of peripheral arterial disease 10/28/2017 -- had a x-ray of the left tibia and fibula done on 10/07/2017 -- showed no bony acute abnormality she was reviewed by Dr. Corene Cornea [name] on 10/26/2017, and he reviewed her CT angiogram and this demonstrated a widely patent left femoral endarterectomy including the origins of the femoral artery. She also had moderate distal disease in the popliteal artery, distal SFA and tibial disease. Her peroneal artery is a dominant runoff distally.he felt she had adequate perfusion for healing the mid calf level and he is putting off all surgical intervention for now and is going to see her back in 2-3 months for noninvasive studies. She also has a new wound on the plantar aspect of the left big toe where she pulled a piece of skin and has caused a superficial ulceration 11/19/17 on evaluation today  patient appears to be doing decently well at this point in time. It does appear that her Wound VAC has been approved as ordered by Dr. Con Memos however she has not received that as of yet. Nonetheless the wound bed in general appears to have minimal slough noted at this time in a fairly good amount of granulation although there is to be a noted in the center of the wound. No fevers, chills, nausea, or vomiting noted at this time. 12/02/17 on evaluation today patient appears to be doing about the same in regard to her left anterior shin wound. Unfortunately her left toe wound appears to still be doing about the same as far as the eschar covering. This has softened up a bit to the point that it is now loose and could have selective debridement which would allow for the Santyl to work better. Fortunately she is having no pain. 12/10/17 on evaluation today patient appears to be doing fairly well in regard to her left anterior shin wound and that it at least appears to be stable. With that being said she does have really no significant healing at this point this wound seems to be mainly just maintaining. Subsequently patient also has the left toe wound which does not seem to be progressing. There is still nonviable tissue overlying the surface of the wound and this doesn't even seem to be loosening up very well at all. And patient has been using the Santyl. I do think this may need selective debridement today. Fortunately there does not appear to be any evidence of infection although I am concerned about the possibility that patient's blood flow is not likely sufficient to be able to tolerate appropriate wound healing. This was discussed with patient today as well although obviously I would leave the determination up to vascular surgery. She will be seeing [name] on February 5 for repeat vascular studies and then in office evaluation. 12/17/17 on evaluation today patient appears to be doing about the same in  regard to her left shin and left great toe ulcers. She has been performing the dressing changes as previously recommended over the past week. With that being said unfortunately nothing seems to be changing much in regard to the dimensions of the wound  and I do feel again that this may be related to vascular flow. Patient's daughter was present during the evaluation today and we did discuss this as well. I will be detailed in greater detail in the plan. [name], [name] ([id number]) 12/23/17 she is here in follow up evaluation of a left great toe and left anterior leg ulcer. There is noted ischemic changes (urple, dry gangrene, shriveling effect laterally) to the left great toe, although not overtly cold. Contact was made to Dr Ozella Almond office regarding possibility of seeing her in the office today or tomorrow as this is an acute change; they recommended she go to the emergency department for evaluation by Dr Ozella Almond partner Dr Curley Spice. The ER was contacted, spoke with Tiffany, regarding this. Patient was accompanied by her son. The left anterior shin ulcer is improved in measurement, overtly exposed bone with area of discoloration/necrosis. will follow up after hospitalization/vascular intervention 12/31/17 on evaluation today patient is status post having gone to the ER last week on 12/23/17. 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 This was performed by Dr. Cleda Mccreedy on 12/23/17. Subsequently Dr. Corene Cornea do with [facility] performed percutaneous evaluation and intervention of patient's left lower extremity on 12/27/17. Angioplasty was performed following angiogram of the left peroneal artery and tibial angioplasty was also performed of the left popliteal artery. Angioplasty of the mid and distal superficial femoral arteries was likewise undertaken. Stents were placed in the left above knee popliteal artery following angioplasty and then a second stent placed in  the mid- SFA secondary to 50% residual stenosis following the angioplasty. Fortunately patient was much better post procedure as far as stenosis/occlusions were concerned and she did have definitely much better blood flow which was noted to be evident on evaluation today. 01/07/18 on evaluation today patient appears to be doing well on her left lower extremity in regard to the anterior shin ulcer. She has been tolerating the dressing changes without complication. The good news is nothing appears to be doing worse and in general she does seem to have more granulation tissue. There is still bone exposure. Fortunately she's been tolerating the dressing changes. Patient's a the eyes that Dr. Bunnie Domino office on 01/04/18 revealed a right ABI of 0.69 and a left ABI of 0.81 obviously this is doing very well and significantly improved. 01/12/18; patient with a traumatic wound on the left anterior tibial area. Fairly substantial wound was still open tibia however the amount of exposed bone is apparently a lot better. We have applied for TheraSkins but have not yet received approval. She has been using silver collagen as mentioned everything appears better. She had recent revascularization by [facility] and had a left ABI of 0.81. 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 This is still wrapped and I did not look at this today, apparently we are not following secondary to this being a recent surgical wound. 01/19/18; patient with a traumatic wound on the left anterior tibial area. Only a small amount of open tibia still remains. We applied Theraskin #1. She has an another wound at the right great toe amputation site. This is being followed by her surgeon on a 73 day global. Our nurse inadvertently remove the wet to dry dressing and reported there is exposed bone. We are not following this as of yet 02/02/18; she is back for a deep traumatic wound over the left anterior tibia. We  applied the first TheraSkin 2 weeks ago. She returns today in follow-up. The wound is better there is no exposed bone. She has home health going out they will change the superficial dressings on this in a week there also dealing with a right great toe amputation site, we are not following this as of yet 02/16/18-she is here in follow up evaluation for left lower extremity wound. Theraskin was reapplied. She saw Dr Cleda Mccreedy today who redressed her amputation site 03/02/18; patient is here for follow-up of her left lower extremity wound. We applied TheraSkin 2 weeks ago. She arrived with the wound bed completely granulated. This required debridement. Dimensions are better healthier looking wound. Nevertheless I didn't think a subsequent TheraSkin was necessary 03/09/18; traumatic wound on the left lower extremity with initially exposed tibia. We closed this over with TheraSkin o2. We've now used Hydrofera Blue starting last week.the daughter was present is doing most of dressing changes. She seems to want to discharge home health although she has an appointment next week with regards to her orthopedic issues in her left foot so they're going to keep home health to see if therapy is required. There is apparently an open area on the left foot as well we have not seen this and haven't been following 03/23/18; traumatic wound on the left anterior lower extremity with initially with exposed tibia. This is closed over. The wound continues to contract and is smaller. Surface looks healthy. She follows with orthopedics for an area on her left foot/great toe apparently this is doing better. We haven't looked at this.they no longer have home health care. Her daughter is changing the dressing using Hydrofera Blue [name], [name] ([id number]) Objective Constitutional Patient is hypertensive.. Pulse regular and within target range for patient.Marland Kitchen Respirations regular, non-labored and within target range..  Temperature is normal and within the target range for the patient.Marland Kitchen appears in no distress. Vitals Time Taken: 1:04 PM, Height: 65 in, Weight: 131 lbs, BMI: 21.8, Temperature: 97.5 F, Pulse: 92 bpm, Respiratory Rate: 18 breaths/min, Blood Pressure: 143/65 mmHg. Eyes Conjunctivae clear. No discharge. Respiratory Respiratory effort is easy and symmetric bilaterally. Rate is normal at rest and on room air.. Cardiovascular femoral pulses palpable on the left. Musculoskeletal left foot is dressed and healing sandal.. Psychiatric No evidence of depression, anxiety, or agitation. Calm, cooperative, and communicative. Appropriate interactions and affect.. General Notes: wound exam; the area on the left anterior tibia looks a lot better. Smaller dimensions healthy granulation. No evidence of infection. No debridement is required Integumentary (Hair, Skin) Wound #1 status is Open. Original cause of wound was Trauma. The wound is located on the Left,Anterior Lower Leg. The wound measures 2cm length x 0.8cm width x 0.2cm depth; 1.257cm^2 area and 0.251cm^3 volume. There is muscle, Fat Layer (Subcutaneous Tissue) Exposed, and fascia exposed. There is no tunneling or undermining noted. There is a medium amount of serosanguineous drainage noted. The wound margin is flat and intact. There is large (67-100%) red, pink, hyper - granulation within the wound bed. There is a small (1-33%) amount of necrotic tissue within the wound bed including Eschar and Adherent Slough. The periwound skin appearance exhibited: Scarring, Maceration, Hemosiderin Staining. The periwound skin appearance did not exhibit: Callus, Crepitus, Excoriation, Induration, Rash, Dry/Scaly, Atrophie Blanche, Cyanosis, Ecchymosis, Mottled, Pallor, Rubor, Erythema. Periwound temperature was noted as No Abnormality. The periwound has tenderness on palpation. 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 ([id number]) Plan Wound Cleansing: Wound #1 Left,Anterior Lower Leg: Clean wound with Normal Saline. Cleanse wound with mild soap and water Anesthetic (add to Medication List): Topical Lidocaine 4% cream applied to wound bed prior to debridement (In Clinic Only). Primary Wound Dressing: Wound #1 Left,Anterior Lower Leg: Hydrafera Blue Ready Transfer Secondary Dressing: ABD and Kerlix/Conform - coban to secure Dressing Change Frequency: Other: - twice weekly Follow-up Appointments: Return Appointment in 1 week. Home Health: D/C Home Health Services #1 I think we can continue Hydrofera Blue. The wound continues to make nice dimensional progress also the wound bed appears healthy. #2 her daughter is changing the dressing now, home health is discharged #3 we will see her again next week in follow-up Electronic Signature(s) Signed: 03/23/2018 5:40:08 PM By: Linton Ham MD Entered By: Linton Ham on 03/23/2018 14:32:34 [name], [name] ([id number]) -------------------------------------------------------------------------------- SuperBill Details Patient Name: Alexa Lowes P. Date of Service: 03/23/2018 Medical Record Number: [id number] Patient Account Number: [id number] Date of Birth/Sex: [birth year] (84 y.o. 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 of other part of left lower leg with necrosis of bone I70.245 Atherosclerosis of native arteries of left leg with ulceration of other part of foot Facility Procedures CPT4 Code: [id number] Description: 01007 - WOUND CARE VISIT-LEV 2 EST PT Modifier: Quantity: 1 Physician Procedures CPT4 Code: 1219758 Description: 83254 - WC PHYS LEVEL 3 - EST PT ICD-10 Diagnosis Description L97.522 Non-pressure chronic ulcer of other part of left foot with fat l I70.242 Atherosclerosis of native arteries of left leg with ulceration o Modifier: ayer exposed f calf Quantity: 1 Electronic Signature(s) Signed: 03/23/2018 5:40:08 PM By: Linton Ham MD Entered By: Linton Ham on 03/23/2018 14:33:31

## 2018-03-30 ENCOUNTER — Encounter: Payer: Medicare Other | Attending: Internal Medicine | Admitting: Internal Medicine

## 2018-03-30 DIAGNOSIS — E1151 Type 2 diabetes mellitus with diabetic peripheral angiopathy without gangrene: Secondary | ICD-10-CM | POA: Insufficient documentation

## 2018-03-30 DIAGNOSIS — L97522 Non-pressure chronic ulcer of other part of left foot with fat layer exposed: Secondary | ICD-10-CM | POA: Diagnosis not present

## 2018-03-30 DIAGNOSIS — Z89412 Acquired absence of left great toe: Secondary | ICD-10-CM | POA: Insufficient documentation

## 2018-03-30 DIAGNOSIS — I70242 Atherosclerosis of native arteries of left leg with ulceration of calf: Secondary | ICD-10-CM | POA: Diagnosis not present

## 2018-03-30 DIAGNOSIS — I10 Essential (primary) hypertension: Secondary | ICD-10-CM | POA: Insufficient documentation

## 2018-03-30 DIAGNOSIS — E13622 Other specified diabetes mellitus with other skin ulcer: Secondary | ICD-10-CM | POA: Diagnosis present

## 2018-03-30 DIAGNOSIS — E11622 Type 2 diabetes mellitus with other skin ulcer: Secondary | ICD-10-CM | POA: Insufficient documentation

## 2018-03-30 DIAGNOSIS — I70245 Atherosclerosis of native arteries of left leg with ulceration of other part of foot: Secondary | ICD-10-CM | POA: Insufficient documentation

## 2018-03-30 DIAGNOSIS — L97824 Non-pressure chronic ulcer of other part of left lower leg with necrosis of bone: Secondary | ICD-10-CM | POA: Diagnosis not present

## 2018-03-30 DIAGNOSIS — L97224 Non-pressure chronic ulcer of left calf with necrosis of bone: Secondary | ICD-10-CM | POA: Diagnosis not present

## 2018-03-30 DIAGNOSIS — E11621 Type 2 diabetes mellitus with foot ulcer: Secondary | ICD-10-CM | POA: Insufficient documentation

## 2018-04-02 NOTE — Progress Notes (Signed)
ALICEA, WENTE ([id number]) Visit Report for 03/30/2018 HPI Details Patient Name: [name], [name] Date of Service: 03/30/2018 2:45 PM Medical Record Number: [id number] Patient Account Number: [id number] Date of Birth/Sex: June 29, 1926 (84 y.o. F) Treating RN: Montey Hora Primary Care Provider: Lucianne Lei Other Clinician: Referring Provider: Lucianne Lei Treating Provider/Extender: Tito Dine in Treatment: 62 History of Present Illness HPI Description: 84 year old patient was seen in the ED about 10 days ago for a history of abrasion to the left lower extremity while she was boarding a bus and scraped the anterior part of her left leg. She has been a diabetic for many years and has been taking treatment regularly. Her past medical history is also significant for anemia arthritis constipation and hypertension and is status post abdominal hysterectomy. She has never been a smoker. after her ER visit she was advised to apply Bactroban ointment to the wound twice a day and continue to monitor her blood glucose levels. her last hemoglobin A1c was done 3 years ago and was 6.6% 06/04/17 patient left anterior shin wound appears to be doing well although it is still somewhat dry despite the treatment with Medihoney. We have been using Kerlex following the Medihoney application and I believe this is just not retaining that much moisture. Nonetheless there is no evidence of infection. 06/11/17 on evaluation today patient appears to be doing a little bit worse in regard to her wound. The entirety of the wound is dry and unfortunately the Medihoney does not seem to be helping this. That is even with the dressing changes that I made last week to try to retain more moisture. She has not tried [employer] as of yet. She was also noncompressible when testing for ABIs. 07/01/2017 -- she had a arterial studies done and was seen by Dr. Lucky Cowboy. her noninvasive studies showed noncompressible vessels on  the right but brisk waveforms and digital pressures of 71 on the right consistent with only mild arterial insufficienc. On the left her ABI was 0.47 and this may be falsely elevated due to calcification. No digital pressures were obtained on the left and this is consistent with severe arterial insufficiency. this critical limb threatening situation on the left, would make wound healing difficult and it represents a serious situation and he has recommended an angiography with possible revascularization. The patient desired to defer the decision to she discuss with the family members. 07/09/17 on evaluation today patient appears to be doing fairly well in regard to her left anterior lower extremity wound. She has been tolerating the dressing changes without complication we have been utilizing Santyl at this time. She tells me that she is having no significant pain compared to what she has had in the past. She has decided after discussing with family that she is going to go forward with the surgery with Dr. dew. This is to restore better blood flow to left lower extremity. With that it does appear that her lower extremity wound is making some progress albeit slow. No fevers, chills, nausea, or vomiting noted at this time. 07/16/17 on evaluation today patient's wound appears to be doing roughly about the same although some of the slough is clearing off. [name] she has her appointment with vascular next week on Thursday the 23rd. With that being said her wound does not appear to be hurting as badly as it has in the past which is good news. No fevers, chills, nausea, or vomiting noted at this time. 07/30/2017 -- she had surgery on  07/22/2017 --indications being nonhealing ulcer on the left leg with a noninvasive study showing marked reduction in ABI of less than 0.5 and no digital pressure on her left leg. she had a percutaneous transluminal angioplasty of the left common and external iliac arteries. She then  had a stent placed to the left external iliac arteries and to the left common iliac artery. 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 The flow distally was poor and there were multiple areas of high-grade stenosis or occlusion of the distal SFA and the popliteal artery with only a diseased peroneal artery as runoff distally. She has a postop appointment on September 23 08/20/2017 -- she went back to the vascular office for an appointment on 08/12/2017 and bilateral ABIs were done which [name], [name] ([id number]) were notable for moderate right lower extremity arterial disease and unable to obtain left ankle brachial indices due to absent Doppler velocities in the posterior tibial artery and the anterior tibial artery. Monophasic waveforms to the left femoral and popliteal artery. this was worrisome for a failure of stents placed and lower extremity angiogram was recommended. as was done on 08/16/2017 and the stents placed in the left eye Lex system had some mild narrowing at the iliac bifurcation and the leading edge of the stent proximally was poor wall apposition with stenosis in the 50-60% range in the right common iliac artery. There was also occlusion of the common femoral artery and the origin of the SFA and profunda femoris artery. Flow distally was poor but they appeared to be multiple areas of high-grade stenosis or occlusion in the distal SFA and popliteal artery with only a diseased peroneal artery. in another stent was placed and deployed. The femoral occlusion would need to be treated surgically. 08/27/17 on evaluation today patient appears to be doing about the same in regard to her left for extremity wound. Unfortunately secondary to her vascular status this is not a very well healing wound at this time. She has been tolerating the dressing changes she has some discomfort but mainly with  cleansing of the wound not at other times. No fevers, chills, nausea, or vomiting noted at this time. 09/17/2017 -- the patient was recently discharged from the hospital where she was admitted between 09/08/2017 and 09/10/2017, when she had a procedure on her left femoral artery with the endarterectomy and a patch. she will be going back for review this coming week. 09/24/2017 -- was seen on 09/18/2017 in the ER, for bleeding from her wound which was debrided earlier during the day. At the time she was seen there was no active bleeding. A thrombin pad was placed over the area and she was asked to change the outside dressing but come back to the wound center as planned. she was also seen at the [facility] office for a review after her surgery and bilateral lower extremity ABI when compared to the previous ones showed minimal improvement of arterial blood flow. She was set up for a CT angiogram of the abdomen and pelvis and lower extremities to see the degree of peripheral arterial disease 10/28/2017 -- had a x-ray of the left tibia and fibula done on 10/07/2017 -- showed no bony acute abnormality she was reviewed by Dr. Corene Cornea dew on 10/26/2017, and he reviewed her CT angiogram and this demonstrated a widely patent left femoral endarterectomy including the origins of the femoral artery.  She also had moderate distal disease in the popliteal artery, distal SFA and tibial disease. Her peroneal artery is a dominant runoff distally.he felt she had adequate perfusion for healing the mid calf level and he is putting off all surgical intervention for now and is going to see her back in 2-3 months for noninvasive studies. She also has a new wound on the plantar aspect of the left big toe where she pulled a piece of skin and has caused a superficial ulceration 11/19/17 on evaluation today patient appears to be doing decently well at this point in time. It does appear that her  Wound VAC has been approved as ordered by Dr. Con Memos however she has not received that as of yet. Nonetheless the wound bed in general appears to have minimal slough noted at this time in a fairly good amount of granulation although there is to be a noted in the center of the wound. No fevers, chills, nausea, or vomiting noted at this time. 12/02/17 on evaluation today patient appears to be doing about the same in regard to her left anterior shin wound. Unfortunately her left toe wound appears to still be doing about the same as far as the eschar covering. This has softened up a bit to the point that it is now loose and could have selective debridement which would allow for the Santyl to work better. Fortunately she is having no pain. 12/10/17 on evaluation today patient appears to be doing fairly well in regard to her left anterior shin wound and that it at least appears to be stable. With that being said she does have really no significant healing at this point this wound seems to be mainly just maintaining. Subsequently patient also has the left toe wound which does not seem to be progressing. There is still nonviable tissue overlying the surface of the wound and this doesn't even seem to be loosening up very well at all. And patient has been using the Santyl. I do think this may need selective debridement today. Fortunately there does not appear to be any evidence of infection although I am concerned about the possibility that patient's blood flow is not likely sufficient to be able to tolerate appropriate wound healing. This was discussed with patient today as well although obviously I would leave the determination up to vascular surgery. She will be seeing Dr. dew on February 5 for repeat vascular studies and then in office evaluation. 12/17/17 on evaluation today patient appears to be doing about the same in regard to her left shin and left great toe ulcers. She has been performing the dressing  changes as previously recommended over the past week. With that being said unfortunately nothing seems to be changing much in regard to the dimensions of the wound and I do feel again that this may [place], [name] ([id number]) be related to vascular flow. Patient's daughter was present during the evaluation today and we did discuss this as well. I will be detailed in greater detail in the plan. 12/23/17 she is here in follow up evaluation of a left great toe and left anterior leg ulcer. There is noted ischemic changes (urple, dry gangrene, shriveling effect laterally) to the left great toe, although not overtly cold. Contact was made to Dr Ozella Almond office regarding possibility of seeing her in the office today or tomorrow as this is an acute change; they recommended she go to the emergency department for evaluation by Dr Ozella Almond partner Dr Curley Spice. The ER was  contacted, spoke with Tiffany, regarding this. Patient was accompanied by her son. The left anterior shin ulcer is improved in measurement, overtly exposed bone with area of discoloration/necrosis. will follow up after hospitalization/vascular intervention 12/31/17 on evaluation today patient is status post having gone to the ER last week on 12/23/17. 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 This was performed by Dr. Cleda Mccreedy on 12/23/17. Subsequently Dr. Corene Cornea do with [facility] performed percutaneous evaluation and intervention of patient's left lower extremity on 12/27/17. Angioplasty was performed following angiogram of the left peroneal artery and tibial angioplasty was also performed of the left popliteal artery. Angioplasty of the mid and distal superficial femoral arteries was likewise undertaken. Stents were placed in the left above knee popliteal artery following angioplasty and then a second stent placed in the mid- SFA secondary to 50% residual stenosis following the angioplasty. Fortunately  patient was much better post procedure as far as stenosis/occlusions were concerned and she did have definitely much better blood flow which was noted to be evident on evaluation today. 01/07/18 on evaluation today patient appears to be doing well on her left lower extremity in regard to the anterior shin ulcer. She has been tolerating the dressing changes without complication. The good news is nothing appears to be doing worse and in general she does seem to have more granulation tissue. There is still bone exposure. Fortunately she's been tolerating the dressing changes. Patient's a the eyes that Dr. Bunnie Domino office on 01/04/18 revealed a right ABI of 0.69 and a left ABI of 0.81 obviously this is doing very well and significantly improved. 01/12/18; patient with a traumatic wound on the left anterior tibial area. Fairly substantial wound was still open tibia however the amount of exposed bone is apparently a lot better. We have applied for TheraSkins but have not yet received approval. She has been using silver collagen as mentioned everything appears better. She had recent revascularization by [facility] and had a left ABI of 0.81. 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 This is still wrapped and I did not look at this today, apparently we are not following secondary to this being a recent surgical wound. 01/19/18; patient with a traumatic wound on the left anterior tibial area. Only a small amount of open tibia still remains. We applied Theraskin #1. She has an another wound at the right great toe amputation site. This is being followed by her surgeon on a 27 day global. Our nurse inadvertently remove the wet to dry dressing and reported there is exposed bone. We are not following this as of yet 02/02/18; she is back for a deep traumatic wound over the left anterior tibia. We applied the first TheraSkin 2 weeks ago. She returns today in follow-up. The wound is better  there is no exposed bone. She has home health going out they will change the superficial dressings on this in a week there also dealing with a right great toe amputation site, we are not following this as of yet 02/16/18-she is here in follow up evaluation for left lower extremity wound. Theraskin was reapplied. She saw Dr Cleda Mccreedy today who redressed her amputation site 03/02/18; patient is here for follow-up of her left lower extremity wound. We applied TheraSkin 2 weeks ago. She arrived with the wound bed completely granulated. This required debridement. Dimensions are better healthier looking wound. Nevertheless I didn't think a subsequent TheraSkin  was necessary 03/09/18; traumatic wound on the left lower extremity with initially exposed tibia. We closed this over with TheraSkin o2. We've now used Hydrofera Blue starting last week.the daughter was present is doing most of dressing changes. She seems to want to discharge home health although she has an appointment next week with regards to her orthopedic issues in her left foot so they're going to keep home health to see if therapy is required. There is apparently an open area on the left foot as well we have not seen this and haven't been following 03/23/18; traumatic wound on the left anterior lower extremity with initially with exposed tibia. This is closed over. The wound continues to contract and is smaller. Surface looks healthy. She follows with orthopedics for an area on her left foot/great toe apparently this is doing better. We haven't looked at this.they no longer have home health care. Her daughter is changing the dressing using Hydrofera Blue 03/30/18; traumatic wound on the left anterior lower extremity with initially exposed tibia. The tibia is closed over. The wound continues to contract and measures smaller. There is only a small part of the inferior part of this that is still open. She follows with orthopedics for an area on her left  foot left great toe. Her daughter is changing the dressing Electronic Signature(s) KAITLYNE, FRIEDHOFF ([id number]) Signed: 03/30/2018 4:28:35 PM By: Linton Ham MD Entered By: Linton Ham on 03/30/2018 16:17:54 [name], [name] ([id number]) -------------------------------------------------------------------------------- Physical Exam Details Patient Name: Alexa Lowes P. Date of Service: 03/30/2018 2:45 PM Medical Record Number: [id number] Patient Account Number: [id number] Date of Birth/Sex: [birth year] (84 y.o. F) Treating RN: Montey Hora Primary Care Provider: Lucianne Lei Other Clinician: Referring Provider: Lucianne Lei Treating Provider/Extender: Tito Dine in Treatment: 28 Constitutional Patient is hypertensive.. Pulse regular and within target range for patient.Marland Kitchen Respirations regular, non-labored and within target range.. Temperature is normal and within the target range for the patient.Marland Kitchen appears in no distress. Notes wound exam; the area on the left anterior tibia continues to look better. Wound is contracted. No debridement was necessary. Did have a slight amount of subcutaneous blistering which opened spontaneously. No evidence of surrounding infection Electronic Signature(s) Signed: 03/30/2018 4:28:35 PM By: Linton Ham MD Entered By: Linton Ham on 03/30/2018 16:19:01 [name], [name] ([id number]) -------------------------------------------------------------------------------- Physician Orders Details Patient Name: Alexa Lowes P. Date of Service: 03/30/2018 2:45 PM Medical Record Number: [id number] Patient Account Number: [id number] Date of Birth/Sex: 06-28-1926 (84 y.o. F) Treating RN: Montey Hora Primary Care Provider: Lucianne Lei Other Clinician: Referring Provider: Lucianne Lei Treating Provider/Extender: Tito Dine in Treatment: 60 Verbal / Phone Orders: No Diagnosis Coding Wound Cleansing Wound #1  Left,Anterior Lower Leg o Clean wound with Normal Saline. o Cleanse wound with mild soap and water Anesthetic (add to Medication List) o Topical Lidocaine 4% cream applied to wound bed prior to debridement (In Clinic Only). Primary Wound Dressing Wound #1 Left,Anterior Lower Leg o Hydrafera Blue Ready Transfer Secondary Dressing o ABD and Kerlix/Conform - coban to secure Dressing Change Frequency Wound #1 Left,Anterior Lower Leg o Other: - twice weekly Follow-up Appointments Wound #1 Left,Anterior Lower Leg o Return Appointment in 1 week. Electronic Signature(s) Signed: 03/30/2018 4:28:35 PM By: Linton Ham MD Signed: 03/30/2018 4:48:19 PM By: Montey Hora Entered By: Montey Hora on 03/30/2018 15:38:58 [name], [name] ([id number]) -------------------------------------------------------------------------------- Problem List Details Patient Name: Alexa Lowes P. Date of Service: 03/30/2018 2:45 PM Medical Record Number: [id number] Patient Account Number: [id number]  Date of Birth/Sex: [birth year] (84 y.o. 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 ([id number]) -------------------------------------------------------------------------------- Progress Note Details Patient Name: Alexa Lowes P. Date of Service: 03/30/2018 2:45 PM Medical Record Number: [id number] Patient Account Number: [id number] Date of Birth/Sex: 02-Dec-1925 (84 y.o. F) Treating RN: Montey Hora Primary Care Provider: Lucianne Lei Other Clinician: Referring Provider: Lucianne Lei Treating Provider/Extender: Tito Dine in Treatment: 43 Subjective History of Present Illness (HPI) 85 year old patient was seen in the ED about 10 days ago for a history of abrasion to the left lower extremity while she was boarding a bus and scraped the anterior part of her left leg. She has been a diabetic for many years and has been taking treatment regularly. Her past medical history is also significant for anemia arthritis constipation and hypertension and is status post abdominal hysterectomy. She has never been a smoker. after her ER visit she was advised to apply Bactroban ointment to the wound twice a day and continue to monitor her blood glucose levels. her last hemoglobin A1c was done 3 years ago and was 6.6% 06/04/17 patient left anterior shin wound appears to be doing well although it is still somewhat dry despite the treatment with Medihoney. We have been using Kerlex following the Medihoney application and I believe this is just not retaining that much moisture. Nonetheless there is no evidence of infection. 06/11/17 on evaluation today patient appears to be doing a little bit worse in regard to her wound. The entirety of the wound is dry and unfortunately the Medihoney does not seem to be helping this. That is even with the dressing changes that I made last week to try to retain more moisture. She has not tried [employer] as of yet. She was also noncompressible when testing  for ABIs. 07/01/2017 -- she had a arterial studies done and was seen by Dr. Lucky Cowboy. her noninvasive studies showed noncompressible vessels on the right but brisk waveforms and digital pressures of 71 on the right consistent with only mild arterial insufficienc. On the left her ABI was 0.47 and this may be falsely elevated due to calcification. No digital pressures were obtained on the left and this is consistent with severe arterial insufficiency. this critical limb threatening situation on the left, would make wound healing difficult and it represents a serious situation and he has recommended an angiography with possible revascularization. The patient desired to defer the decision to she discuss with the family members. 07/09/17 on evaluation today patient appears to be doing fairly well in regard to her left anterior lower extremity wound. She has been tolerating the dressing changes without complication we have been utilizing Santyl at this time. She tells me that she is having no significant pain compared to what  she has had in the past. She has decided after discussing with family that she is going to go forward with the surgery with Dr. dew. This is to restore better blood flow to left lower extremity. With that it does appear that her lower extremity wound is making some progress albeit slow. No fevers, chills, nausea, or vomiting noted at this time. 07/16/17 on evaluation today patient's wound appears to be doing roughly about the same although some of the slough is clearing off. [name] she has her appointment with vascular next week on Thursday the 23rd. With that being said her wound does not appear to be hurting as badly as it has in the past which is good news. No fevers, chills, nausea, or vomiting noted at this time. 07/30/2017 -- she had surgery on 07/22/2017 --indications being nonhealing ulcer on the left leg with a noninvasive study showing marked reduction in ABI of less than 0.5 and  no digital pressure on her left leg. she had a percutaneous transluminal angioplasty of the left common and external iliac arteries. She then had a stent placed to the left external iliac arteries and to the left common iliac artery. 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 The flow distally was poor and there were multiple areas of high-grade stenosis or occlusion of the distal SFA and the popliteal artery with only a diseased peroneal artery as runoff distally. She has a postop appointment on September 23 08/20/2017 -- she went back to the vascular office for an appointment on 08/12/2017 and bilateral ABIs were done which were notable for moderate right lower extremity arterial disease and unable to obtain left ankle brachial indices due to absent Doppler velocities in the posterior tibial artery and the anterior tibial artery. Monophasic waveforms to the left femoral and Napierkowski, Ladeja P. ([id number]) popliteal artery. this was worrisome for a failure of stents placed and lower extremity angiogram was recommended. as was done on 08/16/2017 and the stents placed in the left eye Lex system had some mild narrowing at the iliac bifurcation and the leading edge of the stent proximally was poor wall apposition with stenosis in the 50-60% range in the right common iliac artery. There was also occlusion of the common femoral artery and the origin of the SFA and profunda femoris artery. Flow distally was poor but they appeared to be multiple areas of high-grade stenosis or occlusion in the distal SFA and popliteal artery with only a diseased peroneal artery. in another stent was placed and deployed. The femoral occlusion would need to be treated surgically. 08/27/17 on evaluation today patient appears to be doing about the same in regard to her left for extremity wound. Unfortunately secondary to her vascular  status this is not a very well healing wound at this time. She has been tolerating the dressing changes she has some discomfort but mainly with cleansing of the wound not at other times. No fevers, chills, nausea, or vomiting noted at this time. 09/17/2017 -- the patient was recently discharged from the hospital where she was admitted between 09/08/2017 and 09/10/2017, when she had a procedure on her left femoral artery with the endarterectomy and a patch. she will be going back for review this coming week. 09/24/2017 -- was seen on 09/18/2017 in the ER, for bleeding from her wound which was debrided earlier during the day. At the time she was seen there was no active  bleeding. A thrombin pad was placed over the area and she was asked to change the outside dressing but come back to the wound center as planned. she was also seen at the [facility] office for a review after her surgery and bilateral lower extremity ABI when compared to the previous ones showed minimal improvement of arterial blood flow. She was set up for a CT angiogram of the abdomen and pelvis and lower extremities to see the degree of peripheral arterial disease 10/28/2017 -- had a x-ray of the left tibia and fibula done on 10/07/2017 -- showed no bony acute abnormality she was reviewed by Dr. Corene Cornea dew on 10/26/2017, and he reviewed her CT angiogram and this demonstrated a widely patent left femoral endarterectomy including the origins of the femoral artery. She also had moderate distal disease in the popliteal artery, distal SFA and tibial disease. Her peroneal artery is a dominant runoff distally.he felt she had adequate perfusion for healing the mid calf level and he is putting off all surgical intervention for now and is going to see her back in 2-3 months for noninvasive studies. She also has a new wound on the plantar aspect of the left big toe where she pulled a piece of skin and has caused  a superficial ulceration 11/19/17 on evaluation today patient appears to be doing decently well at this point in time. It does appear that her Wound VAC has been approved as ordered by Dr. Con Memos however she has not received that as of yet. Nonetheless the wound bed in general appears to have minimal slough noted at this time in a fairly good amount of granulation although there is to be a noted in the center of the wound. No fevers, chills, nausea, or vomiting noted at this time. 12/02/17 on evaluation today patient appears to be doing about the same in regard to her left anterior shin wound. Unfortunately her left toe wound appears to still be doing about the same as far as the eschar covering. This has softened up a bit to the point that it is now loose and could have selective debridement which would allow for the Santyl to work better. Fortunately she is having no pain. 12/10/17 on evaluation today patient appears to be doing fairly well in regard to her left anterior shin wound and that it at least appears to be stable. With that being said she does have really no significant healing at this point this wound seems to be mainly just maintaining. Subsequently patient also has the left toe wound which does not seem to be progressing. There is still nonviable tissue overlying the surface of the wound and this doesn't even seem to be loosening up very well at all. And patient has been using the Santyl. I do think this may need selective debridement today. Fortunately there does not appear to be any evidence of infection although I am concerned about the possibility that patient's blood flow is not likely sufficient to be able to tolerate appropriate wound healing. This was discussed with patient today as well although obviously I would leave the determination up to vascular surgery. She will be seeing Dr. dew on February 5 for repeat vascular studies and then in office evaluation. 12/17/17 on  evaluation today patient appears to be doing about the same in regard to her left shin and left great toe ulcers. She has been performing the dressing changes as previously recommended over the past week. With that being said  unfortunately nothing seems to be changing much in regard to the dimensions of the wound and I do feel again that this may be related to vascular flow. Patient's daughter was present during the evaluation today and we did discuss this as well. I will be detailed in greater detail in the plan. [name], [name] ([id number]) 12/23/17 she is here in follow up evaluation of a left great toe and left anterior leg ulcer. There is noted ischemic changes (urple, dry gangrene, shriveling effect laterally) to the left great toe, although not overtly cold. Contact was made to Dr Ozella Almond office regarding possibility of seeing her in the office today or tomorrow as this is an acute change; they recommended she go to the emergency department for evaluation by Dr Ozella Almond partner Dr Curley Spice. The ER was contacted, spoke with Tiffany, regarding this. Patient was accompanied by her son. The left anterior shin ulcer is improved in measurement, overtly exposed bone with area of discoloration/necrosis. will follow up after hospitalization/vascular intervention 12/31/17 on evaluation today patient is status post having gone to the ER last week on 12/23/17. 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 This was performed by Dr. Cleda Mccreedy on 12/23/17. Subsequently Dr. Corene Cornea do with [facility] performed percutaneous evaluation and intervention of patient's left lower extremity on 12/27/17. Angioplasty was performed following angiogram of the left peroneal artery and tibial angioplasty was also performed of the left popliteal artery. Angioplasty of the mid and distal superficial femoral arteries was likewise undertaken. Stents were placed in the left above knee popliteal  artery following angioplasty and then a second stent placed in the mid- SFA secondary to 50% residual stenosis following the angioplasty. Fortunately patient was much better post procedure as far as stenosis/occlusions were concerned and she did have definitely much better blood flow which was noted to be evident on evaluation today. 01/07/18 on evaluation today patient appears to be doing well on her left lower extremity in regard to the anterior shin ulcer. She has been tolerating the dressing changes without complication. The good news is nothing appears to be doing worse and in general she does seem to have more granulation tissue. There is still bone exposure. Fortunately she's been tolerating the dressing changes. Patient's a the eyes that Dr. Bunnie Domino office on 01/04/18 revealed a right ABI of 0.69 and a left ABI of 0.81 obviously this is doing very well and significantly improved. 01/12/18; patient with a traumatic wound on the left anterior tibial area. Fairly substantial wound was still open tibia however the amount of exposed bone is apparently a lot better. We have applied for TheraSkins but have not yet received approval. She has been using silver collagen as mentioned everything appears better. She had recent revascularization by [facility] and had a left ABI of 0.81. 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 This is still wrapped and I did not look at this today, apparently we are not following secondary to this being a recent surgical wound. 01/19/18; patient with a traumatic wound on the left anterior tibial area. Only a small amount of open tibia still remains. We applied Theraskin #1. She has an another wound at the right great toe amputation site. This is being followed by her surgeon on a 20 day global. Our nurse inadvertently remove the wet to dry dressing and reported there is exposed bone. We are not following this as of yet 02/02/18;  she is back  for a deep traumatic wound over the left anterior tibia. We applied the first TheraSkin 2 weeks ago. She returns today in follow-up. The wound is better there is no exposed bone. She has home health going out they will change the superficial dressings on this in a week there also dealing with a right great toe amputation site, we are not following this as of yet 02/16/18-she is here in follow up evaluation for left lower extremity wound. Theraskin was reapplied. She saw Dr Cleda Mccreedy today who redressed her amputation site 03/02/18; patient is here for follow-up of her left lower extremity wound. We applied TheraSkin 2 weeks ago. She arrived with the wound bed completely granulated. This required debridement. Dimensions are better healthier looking wound. Nevertheless I didn't think a subsequent TheraSkin was necessary 03/09/18; traumatic wound on the left lower extremity with initially exposed tibia. We closed this over with TheraSkin o2. We've now used Hydrofera Blue starting last week.the daughter was present is doing most of dressing changes. She seems to want to discharge home health although she has an appointment next week with regards to her orthopedic issues in her left foot so they're going to keep home health to see if therapy is required. There is apparently an open area on the left foot as well we have not seen this and haven't been following 03/23/18; traumatic wound on the left anterior lower extremity with initially with exposed tibia. This is closed over. The wound continues to contract and is smaller. Surface looks healthy. She follows with orthopedics for an area on her left foot/great toe apparently this is doing better. We haven't looked at this.they no longer have home health care. Her daughter is changing the dressing using Hydrofera Blue 03/30/18; traumatic wound on the left anterior lower extremity with initially exposed tibia. The tibia is closed over. The wound continues to  contract and measures smaller. There is only a small part of the inferior part of this that is still open. She follows with orthopedics for an area on her left foot left great toe. Her daughter is changing the dressing [name], [name] ([id number]) Objective Constitutional Patient is hypertensive.. Pulse regular and within target range for patient.Marland Kitchen Respirations regular, non-labored and within target range.. Temperature is normal and within the target range for the patient.Marland Kitchen appears in no distress. Vitals Time Taken: 2:52 PM, Height: 65 in, Weight: 131 lbs, BMI: 21.8, Temperature: 98.2 F, Pulse: 87 bpm, Respiratory Rate: 18 breaths/min, Blood Pressure: 152/70 mmHg. General Notes: wound exam; the area on the left anterior tibia continues to look better. Wound is contracted. No debridement was necessary. Did have a slight amount of subcutaneous blistering which opened spontaneously. No evidence of surrounding infection Integumentary (Hair, Skin) Wound #1 status is Open. Original cause of wound was Trauma. The wound is located on the Left,Anterior Lower Leg. The wound measures 1.5cm length x 0.8cm width x 0.1cm depth; 0.942cm^2 area and 0.094cm^3 volume. There is muscle, Fat Layer (Subcutaneous Tissue) Exposed, and fascia exposed. There is no tunneling or undermining noted. There is a medium amount of serosanguineous drainage noted. The wound margin is flat and intact. There is large (67-100%) red, pink, hyper - granulation within the wound bed. There is a small (1-33%) amount of necrotic tissue within the wound bed including Adherent Slough. The periwound skin appearance exhibited: Scarring, Maceration, Hemosiderin Staining. The periwound skin appearance did not exhibit: Callus, Crepitus, Excoriation, Induration, Rash, Dry/Scaly, Atrophie Blanche, Cyanosis, Ecchymosis, Mottled, Pallor, Rubor, Erythema.  Periwound temperature was noted as No Abnormality. The periwound has tenderness on  palpation. 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 Cleanse wound with mild soap and water Anesthetic (add to Medication List): Topical Lidocaine 4% cream applied to wound bed prior to debridement (In Clinic Only). Primary Wound Dressing: Wound #1 Left,Anterior Lower Leg: [name], [name]. ([id number]) Hydrafera Blue Ready Transfer Secondary Dressing: ABD and Kerlix/Conform - coban to secure Dressing Change Frequency: Wound #1 Left,Anterior Lower Leg: Other: - twice weekly Follow-up Appointments: Wound #1 Left,Anterior Lower Leg: Return Appointment in 1 week. #1 continue with Hydrofera Blue ready/ABDs Kerlix and conformer. She is doing well #2 should be closed in the next week or 2. This was initially a traumatic wound, not sure there will be any secondary prevention Electronic Signature(s) Signed: 03/30/2018 4:28:35 PM By: Linton Ham MD Entered By: Linton Ham on 03/30/2018 16:20:05 Fossett, Haines. ([id number]) -------------------------------------------------------------------------------- SuperBill Details Patient Name: Alexa Lowes P. Date of Service: 03/30/2018 Medical Record Number: [id number] Patient Account Number: [id number] Date of Birth/Sex: 04-15-1926 (84 y.o. 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 I70.245 Atherosclerosis of native arteries of left leg with ulceration of other part of foot Facility Procedures CPT4 Code: [id number] Description: 99213 - WOUND CARE VISIT-LEV 3 EST PT Modifier: Quantity: 1 Physician Procedures CPT4 Code Description: [id number] - WC PHYS LEVEL 2 - EST PT ICD-10 Diagnosis Description I70.242 Atherosclerosis of native arteries of left leg with ulceration of L97.824 Non-pressure chronic ulcer of other part of left lower leg with n Modifier: calf ecrosis of bone Quantity: 1 Electronic Signature(s) Signed: 03/30/2018 4:28:35 PM By: Linton Ham MD Entered By: Linton Ham on 03/30/2018 16:20:32

## 2018-04-02 NOTE — Progress Notes (Signed)
ILYANNA, BAILLARGEON ([id number]) Visit Report for 03/30/2018 Arrival Information Details Patient Name: [name], [name] P. Date of Service: 03/30/2018 2:45 PM Medical Record Number: [id number] Patient Account Number: [id number] Date of Birth/Sex: 01-Nov-1926 (84 y.o. F) Treating RN: Ahmed Prima Primary Care Jawuan Robb: Lucianne Lei Other Clinician: Referring Tiena Manansala: Lucianne Lei Treating Abdulloh Ullom/Extender: Tito Dine in Treatment: 4 Visit Information History Since Last Visit All ordered tests and consults were completed: No Patient Arrived: Cane Added or deleted any medications: No Arrival Time: 14:50 Any new allergies or adverse reactions: No Accompanied By: self Had a fall or experienced change in No Transfer Assistance: EasyPivot Patient activities of daily living that may affect Lift risk of falls: Patient Identification Verified: Yes Signs or symptoms of abuse/neglect since last visito No Secondary Verification Process Yes Hospitalized since last visit: No Completed: Implantable device outside of the clinic excluding No Patient Requires Transmission-Based No cellular tissue based products placed in the center Precautions: since last visit: Patient Has Alerts: Yes Has Dressing in Place as Prescribed: Yes Patient Alerts: 01/04/2018 ABI @ Pain Present Now: No AVVS (R) 0.69 (L) 0.81 Electronic Signature(s) Signed: 03/30/2018 4:27:21 PM By: Alric Quan Entered By: Alric Quan on 03/30/2018 14:50:20 Sardina, FVCBSWHQ P. ([id number]) -------------------------------------------------------------------------------- Clinic Level of Care Assessment Details Patient Name: [name] P. Date of Service: 03/30/2018 2:45 PM Medical Record Number: [id number] Patient Account Number: [id number] Date of Birth/Sex: August 09, 1926 (84 y.o. F) Treating RN: Montey Hora Primary Care Dayna Geurts: Lucianne Lei Other Clinician: Referring Jonnelle Lawniczak: Lucianne Lei Treating  Gabriela Irigoyen/Extender: Tito Dine in Treatment: 64 Clinic Level of Care Assessment Items TOOL 4 Quantity Score []  - Use when only an EandM is performed on FOLLOW-UP visit 0 ASSESSMENTS - Nursing Assessment / Reassessment X - Reassessment of Co-morbidities (includes updates in patient status) 1 10 X- 1 5 Reassessment of Adherence to Treatment Plan ASSESSMENTS - Wound and Skin Assessment / Reassessment X - Simple Wound Assessment / Reassessment - one wound 1 5 []  - 0 Complex Wound Assessment / Reassessment - multiple wounds []  - 0 Dermatologic / Skin Assessment (not related to wound area) ASSESSMENTS - Focused Assessment []  - Circumferential Edema Measurements - multi extremities 0 []  - 0 Nutritional Assessment / Counseling / Intervention X- 1 5 Lower Extremity Assessment (monofilament, tuning fork, pulses) []  - 0 Peripheral Arterial Disease Assessment (using hand held doppler) ASSESSMENTS - Ostomy and/or Continence Assessment and Care []  - Incontinence Assessment and Management 0 []  - 0 Ostomy Care Assessment and Management (repouching, etc.) PROCESS - Coordination of Care X - Simple Patient / Family Education for ongoing care 1 15 []  - 0 Complex (extensive) Patient / Family Education for ongoing care []  - 0 Staff obtains Programmer, systems, Records, Test Results / Process Orders []  - 0 Staff telephones HHA, Nursing Homes / Clarify orders / etc []  - 0 Routine Transfer to another Facility (non-emergent condition) []  - 0 Routine Hospital Admission (non-emergent condition) []  - 0 New Admissions / Biomedical engineer / Ordering NPWT, Apligraf, etc. []  - 0 Emergency Hospital Admission (emergent condition) X- 1 10 Simple Discharge Coordination Cianci, Jozee P. ([id number]) []  - 0 Complex (extensive) Discharge Coordination PROCESS - Special Needs []  - Pediatric / Minor Patient Management 0 []  - 0 Isolation Patient Management []  - 0 Hearing / Language / Visual  special needs []  - 0 Assessment of Community assistance (transportation, D/C planning, etc.) []  - 0 Additional assistance / Altered mentation []  - 0 Support Surface(s) Assessment (bed, cushion, seat, etc.) INTERVENTIONS -  Wound Cleansing / Measurement X - Simple Wound Cleansing - one wound 1 5 []  - 0 Complex Wound Cleansing - multiple wounds X- 1 5 Wound Imaging (photographs - any number of wounds) []  - 0 Wound Tracing (instead of photographs) X- 1 5 Simple Wound Measurement - one wound []  - 0 Complex Wound Measurement - multiple wounds INTERVENTIONS - Wound Dressings X - Small Wound Dressing one or multiple wounds 1 10 []  - 0 Medium Wound Dressing one or multiple wounds []  - 0 Large Wound Dressing one or multiple wounds []  - 0 Application of Medications - topical []  - 0 Application of Medications - injection INTERVENTIONS - Miscellaneous []  - External ear exam 0 []  - 0 Specimen Collection (cultures, biopsies, blood, body fluids, etc.) []  - 0 Specimen(s) / Culture(s) sent or taken to Lab for analysis []  - 0 Patient Transfer (multiple staff / Civil Service fast streamer / Similar devices) []  - 0 Simple Staple / Suture removal (25 or less) []  - 0 Complex Staple / Suture removal (26 or more) []  - 0 Hypo / Hyperglycemic Management (close monitor of Blood Glucose) []  - 0 Ankle / Brachial Index (ABI) - do not check if billed separately X- 1 5 Vital Signs Carcamo, Shannara P. ([id number]) Has the patient been seen at the hospital within the last three years: Yes Total Score: 80 Level Of Care: New/Established - Level 3 Electronic Signature(s) Signed: 03/30/2018 4:48:19 PM By: Montey Hora Entered By: Montey Hora on 03/30/2018 15:39:28 Trieu, TKZSWFUX N. ([id number]) -------------------------------------------------------------------------------- Encounter Discharge Information Details Patient Name: [name] P. Date of Service: 03/30/2018 2:45 PM Medical Record Number:  [id number] Patient Account Number: [id number] Date of Birth/Sex: 09-28-1926 (84 y.o. F) Treating RN: Montey Hora Primary Care Margaurite Salido: Lucianne Lei Other Clinician: Referring Wendie Diskin: Lucianne Lei Treating Jessilyn Catino/Extender: Tito Dine in Treatment: 53 Encounter Discharge Information Items Discharge Pain Level: 0 Discharge Condition: Stable Ambulatory Status: Cane Discharge Destination: Home Transportation: Private Auto Accompanied By: self Schedule Follow-up Appointment: Yes Medication Reconciliation completed and No provided to Patient/Care Silvio Sausedo: Provided on Clinical Summary of Care: 03/30/2018 Form Type Recipient Paper Patient GW Electronic Signature(s) Signed: 03/30/2018 4:04:35 PM By: Montey Hora Entered By: Montey Hora on 03/30/2018 16:04:34 Armato, [name] ([id number]) -------------------------------------------------------------------------------- Lower Extremity Assessment Details Patient Name: [name] P. Date of Service: 03/30/2018 2:45 PM Medical Record Number: [id number] Patient Account Number: [id number] Date of Birth/Sex: [birth year] (84 y.o. F) Treating RN: Ahmed Prima Primary Care Kelisha Dall: Lucianne Lei Other Clinician: Referring Maloni Musleh: Lucianne Lei Treating Maleiya Pergola/Extender: Tito Dine in Treatment: 52 Vascular Assessment Pulses: Posterior Tibial Extremity colors, hair growth, and conditions: Extremity Color: [Left:Hyperpigmented] Temperature of Extremity: [Left:Warm] Electronic Signature(s) Signed: 03/30/2018 4:27:21 PM By: Alric Quan Entered By: Alric Quan on 03/30/2018 14:56:47 Farro, Alaiyah P. ([id number]) -------------------------------------------------------------------------------- Multi Wound Chart Details Patient Name: [name] P. Date of Service: 03/30/2018 2:45 PM Medical Record Number: [id number] Patient Account Number: [id number] Date of Birth/Sex: 12-22-1925 (84 y.o.  F) Treating RN: Montey Hora Primary Care Merel Santoli: Lucianne Lei Other Clinician: Referring Ayanah Snader: Lucianne Lei Treating Cheick Suhr/Extender: Tito Dine in Treatment: 43 Vital Signs Height(in): 65 Pulse(bpm): 36 Weight(lbs): 131 Blood Pressure(mmHg): 152/70 Body Mass Index(BMI): 22 Temperature(F): 98.2 Respiratory Rate 18 (breaths/min): Photos: [1:No Photos] [N/A:N/A] Wound Location: [1:Left Lower Leg - Anterior] [N/A:N/A] Wounding Event: [1:Trauma] [N/A:N/A] Primary Etiology: [1:Diabetic Wound/Ulcer of the Lower Extremity] [N/A:N/A] Secondary Etiology: [1:Trauma, Other] [N/A:N/A] Comorbid History: [1:Anemia, Hypertension, Type II Diabetes, Osteoarthritis] [N/A:N/A] Date Acquired: [1:05/02/2017] [N/A:N/A] Weeks of Treatment: [  1:43] [N/A:N/A] Wound Status: [1:Open] [N/A:N/A] Measurements L x W x D [1:1.5x0.8x0.1] [N/A:N/A] (cm) Area (cm) : [1:0.942] [N/A:N/A] Volume (cm) : [1:0.094] [N/A:N/A] % Reduction in Area: [1:80.40%] [N/A:N/A] % Reduction in Volume: [1:90.20%] [N/A:N/A] Classification: [1:Grade 2] [N/A:N/A] Exudate Amount: [1:Medium] [N/A:N/A] Exudate Type: [1:Serosanguineous] [N/A:N/A] Exudate Color: [1:red, brown] [N/A:N/A] Wound Margin: [1:Flat and Intact] [N/A:N/A] Granulation Amount: [1:Large (67-100%)] [N/A:N/A] Granulation Quality: [1:Red, Pink, Hyper-granulation] [N/A:N/A] Necrotic Amount: [1:Small (1-33%)] [N/A:N/A] Exposed Structures: [1:Fascia: Yes Fat Layer (Subcutaneous Tissue) Exposed: Yes Muscle: Yes Tendon: No Joint: No Bone: No] [N/A:N/A] Epithelialization: [1:Small (1-33%)] [N/A:N/A] Periwound Skin Texture: [1:Scarring: Yes Excoriation: No Induration: No Callus: No] [N/A:N/A] Crepitus: No Rash: No Periwound Skin Moisture: Maceration: Yes N/A N/A Dry/Scaly: No Periwound Skin Color: Hemosiderin Staining: Yes N/A N/A Atrophie Blanche: No Cyanosis: No Ecchymosis: No Erythema: No Mottled: No Pallor: No Rubor:  No Temperature: No Abnormality N/A N/A Tenderness on Palpation: Yes N/A N/A Wound Preparation: Ulcer Cleansing: N/A N/A Rinsed/Irrigated with Saline Topical Anesthetic Applied: Other: lidocaine 4% Treatment Notes Wound #1 (Left, Anterior Lower Leg) 1. Cleansed with: Clean wound with Normal Saline 2. Anesthetic Topical Lidocaine 4% cream to wound bed prior to debridement 4. Dressing Applied: Hydrafera Blue 5. Secondary Dressing Applied ABD Pad Kerlix/Conform 7. Secured with Tape Notes kerlix and coban light wrap with tape, secured with netting Electronic Signature(s) Signed: 03/30/2018 4:28:35 PM By: Linton Ham MD Entered By: Linton Ham on 03/30/2018 16:16:55 [place], [name] ([id number]) -------------------------------------------------------------------------------- Multi-Disciplinary Care Plan Details Patient Name: [name] P. Date of Service: 03/30/2018 2:45 PM Medical Record Number: [id number] Patient Account Number: [id number] Date of Birth/Sex: 1926-07-03 (84 y.o. F) Treating RN: Montey Hora Primary Care Verita Kuroda: Lucianne Lei Other Clinician: Referring Tilda Samudio: Lucianne Lei Treating Abel Ra/Extender: Tito Dine in Treatment: 63 Active Inactive ` Abuse / Safety / Falls / Self Care Management Nursing Diagnoses: Potential for falls Goals: Patient will remain injury free related to falls Date Initiated: 05/28/2017 Target Resolution Date: 07/23/2017 Goal Status: Active Interventions: Assess fall risk on admission and as needed Notes: ` Orientation to the Wound Care Program Nursing Diagnoses: Knowledge deficit related to the wound healing center program Goals: Patient/caregiver will verbalize understanding of the [place] Program Date Initiated: 05/28/2017 Target Resolution Date: 07/23/2017 Goal Status: Active Interventions: Provide education on orientation to the wound center Notes: ` Wound/Skin  Impairment Nursing Diagnoses: Impaired tissue integrity Goals: Ulcer/skin breakdown will have a volume reduction of 30% by week 4 Date Initiated: 05/28/2017 Target Resolution Date: 07/23/2017 Goal Status: Active Ulcer/skin breakdown will have a volume reduction of 50% by week 8 Date Initiated: 05/28/2017 Target Resolution Date: 07/23/2017 TYLENE, QUASHIE ([id number]) Goal Status: Active Ulcer/skin breakdown will have a volume reduction of 80% by week 12 Date Initiated: 05/28/2017 Target Resolution Date: 07/23/2017 Goal Status: Active Ulcer/skin breakdown will heal within 14 weeks Date Initiated: 05/28/2017 Target Resolution Date: 07/23/2017 Goal Status: Active Interventions: Assess patient/caregiver ability to obtain necessary supplies Assess patient/caregiver ability to perform ulcer/skin care regimen upon admission and as needed Assess ulceration(s) every visit Notes: Electronic Signature(s) Signed: 03/30/2018 4:48:19 PM By: Montey Hora Entered By: Montey Hora on 03/30/2018 15:38:14 Nelms, Dakota P. ([id number]) -------------------------------------------------------------------------------- Pain Assessment Details Patient Name: [name] P. Date of Service: 03/30/2018 2:45 PM Medical Record Number: [id number] Patient Account Number: [id number] Date of Birth/Sex: 09-16-1926 (84 y.o. F) Treating RN: Ahmed Prima Primary Care Ingra Rother: Lucianne Lei Other Clinician: Referring Tracyann Duffell: Lucianne Lei Treating Mallie Giambra/Extender: Tito Dine in Treatment: 10 Active Problems Location  of Pain Severity and Description of Pain Patient Has Paino No Site Locations Pain Management and Medication Current Pain Management: Electronic Signature(s) Signed: 03/30/2018 4:27:21 PM By: Alric Quan Entered By: Alric Quan on 03/30/2018 14:51:59 Harkleroad, [name]  ([id number]) -------------------------------------------------------------------------------- Patient/Caregiver Education Details Patient Name: [name] P. Date of Service: 03/30/2018 2:45 PM Medical Record Number: [id number] Patient Account Number: [id number] Date of Birth/Gender: [birth year] (84 y.o. F) Treating RN: Montey Hora Primary Care Physician: Lucianne Lei Other Clinician: Referring Physician: Lucianne Lei Treating Physician/Extender: Tito Dine in Treatment: 3 Education Assessment Education Provided To: Patient Education Topics Provided Wound/Skin Impairment: Handouts: Other: wound care as ordered Methods: Demonstration, Explain/Verbal Responses: State content correctly Electronic Signature(s) Signed: 03/30/2018 4:48:19 PM By: Montey Hora Entered By: Montey Hora on 03/30/2018 16:04:57 Washam, Atalaya P. ([id number]) -------------------------------------------------------------------------------- Wound Assessment Details Patient Name: [name] P. Date of Service: 03/30/2018 2:45 PM Medical Record Number: [id number] Patient Account Number: [id number] Date of Birth/Sex: [birth year] (84 y.o. F) Treating RN: Ahmed Prima Primary Care Nishika Parkhurst: Lucianne Lei Other Clinician: Referring Dymphna Wadley: Lucianne Lei Treating Lavanda Nevels/Extender: Tito Dine in Treatment: 43 Wound Status Wound Number: 1 Primary Etiology: Diabetic Wound/Ulcer of the Lower Extremity Wound Location: Left Lower Leg - Anterior Secondary Trauma, Other Wounding Event: Trauma Etiology: Date Acquired: 05/02/2017 Wound Status: Open Weeks Of Treatment: 43 Comorbid Anemia, Hypertension, Type II Diabetes, Clustered Wound: No History: Osteoarthritis Photos Photo Uploaded By: Alric Quan on 03/30/2018 16:25:37 Wound Measurements Length: (cm) 1.5 Width: (cm) 0.8 Depth: (cm) 0.1 Area: (cm) 0.942 Volume: (cm) 0.094 % Reduction in Area: 80.4% %  Reduction in Volume: 90.2% Epithelialization: Small (1-33%) Tunneling: No Undermining: No Wound Description Classification: Grade 2 Wound Margin: Flat and Intact Exudate Amount: Medium Exudate Type: Serosanguineous Exudate Color: red, brown Foul Odor After Cleansing: No Slough/Fibrino Yes Wound Bed Granulation Amount: Large (67-100%) Exposed Structure Granulation Quality: Red, Pink, Hyper-granulation Fascia Exposed: Yes Necrotic Amount: Small (1-33%) Fat Layer (Subcutaneous Tissue) Exposed: Yes Necrotic Quality: Adherent Slough Tendon Exposed: No Muscle Exposed: Yes Necrosis of Muscle: No Joint Exposed: No Bone Exposed: No Periwound Skin Texture Texture Color Clubb, Dianca P. ([id number]) No Abnormalities Noted: No No Abnormalities Noted: No Callus: No Atrophie Blanche: No Crepitus: No Cyanosis: No Excoriation: No Ecchymosis: No Induration: No Erythema: No Rash: No Hemosiderin Staining: Yes Scarring: Yes Mottled: No Pallor: No Moisture Rubor: No No Abnormalities Noted: No Dry / Scaly: No Temperature / Pain Maceration: Yes Temperature: No Abnormality Tenderness on Palpation: Yes Wound Preparation Ulcer Cleansing: Rinsed/Irrigated with Saline Topical Anesthetic Applied: Other: lidocaine 4%, Treatment Notes Wound #1 (Left, Anterior Lower Leg) 1. Cleansed with: Clean wound with Normal Saline 2. Anesthetic Topical Lidocaine 4% cream to wound bed prior to debridement 4. Dressing Applied: Hydrafera Blue 5. Secondary Dressing Applied ABD Pad Kerlix/Conform 7. Secured with Tape Notes kerlix and coban light wrap with tape, secured with netting Electronic Signature(s) Signed: 03/30/2018 4:27:21 PM By: Alric Quan Entered By: Alric Quan on 03/30/2018 14:56:14 Nees, Oliana P. ([id number]) -------------------------------------------------------------------------------- Vitals Details Patient Name: [name] P. Date of Service:  03/30/2018 2:45 PM Medical Record Number: [id number] Patient Account Number: [id number] Date of Birth/Sex: [birth year] (84 y.o. F) Treating RN: Ahmed Prima Primary Care Ayodele Sangalang: Lucianne Lei Other Clinician: Referring Gracia Saggese: Lucianne Lei Treating Anora Schwenke/Extender: Tito Dine in Treatment: 43 Vital Signs Time Taken: 14:52 Temperature (F): 98.2 Height (in): 65 Pulse (bpm): 87 Weight (lbs): 131 Respiratory Rate (breaths/min): 18 Body Mass Index (BMI): 21.8 Blood Pressure (mmHg): 152/70 Reference Range:  80 - 120 mg / dl Electronic Signature(s) Signed: 03/30/2018 4:27:21 PM By: Alric Quan Entered By: Alric Quan on 03/30/2018 14:52:19

## 2018-04-04 ENCOUNTER — Ambulatory Visit (INDEPENDENT_AMBULATORY_CARE_PROVIDER_SITE_OTHER): Payer: Medicare Other | Admitting: Podiatry

## 2018-04-04 ENCOUNTER — Encounter: Payer: Self-pay | Admitting: Podiatry

## 2018-04-04 DIAGNOSIS — M79675 Pain in left toe(s): Secondary | ICD-10-CM | POA: Diagnosis not present

## 2018-04-04 DIAGNOSIS — D689 Coagulation defect, unspecified: Secondary | ICD-10-CM | POA: Diagnosis not present

## 2018-04-04 DIAGNOSIS — M79674 Pain in right toe(s): Secondary | ICD-10-CM

## 2018-04-04 DIAGNOSIS — L84 Corns and callosities: Secondary | ICD-10-CM

## 2018-04-04 DIAGNOSIS — B351 Tinea unguium: Secondary | ICD-10-CM | POA: Diagnosis not present

## 2018-04-06 ENCOUNTER — Encounter: Payer: Medicare Other | Admitting: Internal Medicine

## 2018-04-06 DIAGNOSIS — E11622 Type 2 diabetes mellitus with other skin ulcer: Secondary | ICD-10-CM | POA: Diagnosis not present

## 2018-04-06 NOTE — Progress Notes (Signed)
Subjective:   Patient ID: [name], female   DOB: 84 y.o.   MRN: [id number]   HPI Patient presents with elongated nailbeds 1-5 both feet that she cannot cut and they become painful shoe gear   ROS      Objective:  Physical Exam  Neurovascular status unchanged with thick yellow brittle nailbeds 1-5 both feet that are painful     Assessment:  Mycotic nail infection with pain 1-5 both feet     Plan:  Debride painful nailbeds 1-5 both feet with no iatrogenic bleeding noted

## 2018-04-09 NOTE — Progress Notes (Signed)
[name], [name] ([id number]) Visit Report for 04/06/2018 Debridement Details Patient Name: [name], [name]. Date of Service: 04/06/2018 3:15 PM Medical Record Number: [id number] Patient Account Number: [id number] Date of Birth/Sex: 17-Jul-1926 (84 y.o. F) Treating RN: Cornell Barman Primary Care Provider: Lucianne Lei Other Clinician: Referring Provider: Lucianne Lei Treating Provider/Extender: Tito Dine in Treatment: 44 Debridement Performed for Wound #1 Left,Anterior Lower Leg Assessment: Performed By: Physician [name], MD Debridement Type: Debridement Severity of Tissue Pre Fat layer exposed Debridement: Pre-procedure Verification/Time Yes - 15:55 Out Taken: Start Time: 15:55 Pain Control: Other : lidocaine 4% Total Area Debrided (L x W): 0.6 (cm) x 0.3 (cm) = 0.18 (cm) Tissue Alexa other material Non-Viable, Eschar debrided: Level: Non-Viable Tissue Debridement Description: Selective/Open Wound Instrument: Curette Bleeding: None End Time: 15:57 Procedural Pain: 0 Post Procedural Pain: 0 Response to Treatment: Procedure was tolerated well Level of Consciousness: Awake Alexa Alert Post Procedure Vitals: Temperature: 98.2 Pulse: 76 Respiratory Rate: 16 Blood Pressure: Systolic Blood Pressure: 509 Diastolic Blood Pressure: 53 Post Debridement Measurements of Total Wound Length: (cm) 0.6 Width: (cm) 0.3 Depth: (cm) 0.1 Volume: (cm) 0.014 Character of Wound/Ulcer Post Debridement: Stable Severity of Tissue Post Debridement: Fat layer exposed Post Procedure Diagnosis Same as Pre-procedure Electronic Signature(s) NEVADA, KIRCHNER ([id number]) Signed: 04/06/2018 5:47:31 PM By: Gretta Cool, BSN, RN, CWS, Kim RN, BSN Signed: 04/07/2018 1:16:01 PM By: Linton Ham MD Entered By: Linton Ham on 04/06/2018 16:54:08 [name], [name]. ([id number]) -------------------------------------------------------------------------------- HPI  Details Patient Name: [name]. Date of Service: 04/06/2018 3:15 PM Medical Record Number: [id number] Patient Account Number: [id number] Date of Birth/Sex: October 27, 1926 (84 y.o. F) Treating RN: Cornell Barman Primary Care Provider: Lucianne Lei Other Clinician: Referring Provider: Lucianne Lei Treating Provider/Extender: Tito Dine in Treatment: 50 History of Present Illness HPI Description: 84 year old patient was seen in the ED about 10 days ago for a history of abrasion to the left lower extremity while she was boarding a bus Alexa scraped the anterior part of her left leg. She has been a diabetic for many years Alexa has been taking treatment regularly. Her past medical history is also significant for anemia arthritis constipation Alexa hypertension Alexa is status post abdominal hysterectomy. She has never been a smoker. after her ER visit she was advised to apply Bactroban ointment to the wound twice a day Alexa continue to monitor her blood glucose levels. her last hemoglobin A1c was done 3 years ago Alexa was 6.6% 06/04/17 patient left anterior shin wound appears to be doing well although it is still somewhat dry despite the treatment with Medihoney. We have been using Kerlex following the Medihoney application Alexa I believe this is just not retaining that much moisture. Nonetheless there is no evidence of infection. 06/11/17 on evaluation today patient appears to be doing a little bit worse in regard to her wound. The entirety of the wound is dry Alexa unfortunately the Medihoney does not seem to be helping this. That is even with the dressing changes that I made last week to try to retain more moisture. She has not tried [employer] as of yet. She was also noncompressible when testing for ABIs. 07/01/2017 -- she had a arterial studies done Alexa was seen by Dr. Lucky Cowboy. her noninvasive studies showed noncompressible vessels on the right but brisk waveforms Alexa digital pressures of 71 on the  right consistent with only mild arterial insufficienc. On the left her ABI was 0.47 Alexa this may be falsely elevated due to  calcification. No digital pressures were obtained on the left Alexa this is consistent with severe arterial insufficiency. this critical limb threatening situation on the left, would make wound healing difficult Alexa it represents a serious situation Alexa he has recommended an angiography with possible revascularization. The patient desired to defer the decision to she discuss with the family members. 07/09/17 on evaluation today patient appears to be doing fairly well in regard to her left anterior lower extremity wound. She has been tolerating the dressing changes without complication we have been utilizing Santyl at this time. She tells me that she is having no significant pain compared to what she has had in the past. She has decided after discussing with family that she is going to go forward with the surgery with Dr. dew. This is to restore better blood flow to left lower extremity. With that it does appear that her lower extremity wound is making some progress albeit slow. No fevers, chills, nausea, or vomiting noted at this time. 07/16/17 on evaluation today patient's wound appears to be doing roughly about the same although some of the slough is clearing off. [name] she has her appointment with vascular next week on Thursday the 23rd. With that being said her wound does not appear to be hurting as badly as it has in the past which is good news. No fevers, chills, nausea, or vomiting noted at this time. 07/30/2017 -- she had surgery on 07/22/2017 --indications being nonhealing ulcer on the left leg with a noninvasive study showing marked reduction in ABI of less than 0.5 Alexa no digital pressure on her left leg. she had a percutaneous transluminal angioplasty of the left common Alexa external iliac arteries. She then had a stent placed to the left external iliac arteries Alexa  to the left common iliac artery. the left lower extremity showed occlusion of the common femoral artery Alexa the origins of the SFA Alexa the profunda femoris artery. The flow distally was poor Alexa there were multiple areas of high-grade stenosis or occlusion of the distal SFA Alexa the popliteal artery with only a diseased peroneal artery as runoff distally. She has a postop appointment on September 23 08/20/2017 -- she went back to the vascular office for an appointment on 08/12/2017 Alexa bilateral ABIs were done which were notable for moderate right lower extremity arterial disease Alexa unable to obtain left ankle brachial indices due to absent Doppler velocities in the posterior tibial artery Alexa the anterior tibial artery. Monophasic waveforms to the left femoral Alexa popliteal artery. this was worrisome for a failure of stents placed Alexa lower extremity angiogram was recommended. as was DAKSHA, KOONE ([id number]) done on 08/16/2017 Alexa the stents placed in the left eye Lex system had some mild narrowing at the iliac bifurcation Alexa the leading edge of the stent proximally was poor wall apposition with stenosis in the 50-60% range in the right common iliac artery. There was also occlusion of the common femoral artery Alexa the origin of the SFA Alexa profunda femoris artery. Flow distally was poor but they appeared to be multiple areas of high-grade stenosis or occlusion in the distal SFA Alexa popliteal artery with only a diseased peroneal artery. in another stent was placed Alexa deployed. The femoral occlusion would need to be treated surgically. 08/27/17 on evaluation today patient appears to be doing about the same in regard to her left for extremity wound. Unfortunately secondary to her vascular status this is not a very well healing wound at this  time. She has been tolerating the dressing changes she has some discomfort but mainly with cleansing of the wound not at other times. No fevers,  chills, nausea, or vomiting noted at this time. 09/17/2017 -- the patient was recently discharged from the hospital where she was admitted between 09/08/2017 Alexa 09/10/2017, when she had a procedure on her left femoral artery with the endarterectomy Alexa a patch. she will be going back for review this coming week. 09/24/2017 -- was seen on 09/18/2017 in the ER, for bleeding from her wound which was debrided earlier during the day. At the time she was seen there was no active bleeding. A thrombin pad was placed over the area Alexa she was asked to change the outside dressing but come back to the wound center as planned. she was also seen at the [facility] vascular surgery office for a review after her surgery Alexa bilateral lower extremity ABI when compared to the previous ones showed minimal improvement of arterial blood flow. She was set up for a CT angiogram of the abdomen Alexa pelvis Alexa lower extremities to see the degree of peripheral arterial disease 10/28/2017 -- had a x-ray of the left tibia Alexa fibula done on 10/07/2017 -- showed no bony acute abnormality she was reviewed by Dr. Corene Cornea dew on 10/26/2017, Alexa he reviewed her CT angiogram Alexa this demonstrated a widely patent left femoral endarterectomy including the origins of the femoral artery. She also had moderate distal disease in the popliteal artery, distal SFA Alexa tibial disease. Her peroneal artery is a dominant runoff distally.he felt she had adequate perfusion for healing the mid calf level Alexa he is putting off all surgical intervention for now Alexa is going to see her back in 2-3 months for noninvasive studies. She also has a new wound on the plantar aspect of the left big toe where she pulled a piece of skin Alexa has caused a superficial ulceration 11/19/17 on evaluation today patient appears to be doing decently well at this point in time. It does appear that her Wound VAC has been approved as ordered by Dr. Con Memos however  she has not received that as of yet. Nonetheless the wound bed in general appears to have minimal slough noted at this time in a fairly good amount of granulation although there is to be a noted in the center of the wound. No fevers, chills, nausea, or vomiting noted at this time. 12/02/17 on evaluation today patient appears to be doing about the same in regard to her left anterior shin wound. Unfortunately her left toe wound appears to still be doing about the same as far as the eschar covering. This has softened up a bit to the point that it is now loose Alexa could have selective debridement which would allow for the Santyl to work better. Fortunately she is having no pain. 12/10/17 on evaluation today patient appears to be doing fairly well in regard to her left anterior shin wound Alexa that it at least appears to be stable. With that being said she does have really no significant healing at this point this wound seems to be mainly just maintaining. Subsequently patient also has the left toe wound which does not seem to be progressing. There is still nonviable tissue overlying the surface of the wound Alexa this doesn't even seem to be loosening up very well at all. Alexa patient has been using the Santyl. I do think this may need selective debridement today. Fortunately there does not  appear to be any evidence of infection although I am concerned about the possibility that patient's blood flow is not likely sufficient to be able to tolerate appropriate wound healing. This was discussed with patient today as well although obviously I would leave the determination up to vascular surgery. She will be seeing Dr. dew on February 5 for repeat vascular studies Alexa then in office evaluation. 12/17/17 on evaluation today patient appears to be doing about the same in regard to her left shin Alexa left great toe ulcers. She has been performing the dressing changes as previously recommended over the past week. With  that being said unfortunately nothing seems to be changing much in regard to the dimensions of the wound Alexa I do feel again that this may be related to vascular flow. Patient's daughter was present during the evaluation today Alexa we did discuss this as well. I will be detailed in greater detail in the plan. CUMI, SANAGUSTIN ([id number]) 12/23/17 she is here in follow up evaluation of a left great toe Alexa left anterior leg ulcer. There is noted ischemic changes (urple, dry gangrene, shriveling effect laterally) to the left great toe, although not overtly cold. Contact was made to Dr Ozella Almond office regarding possibility of seeing her in the office today or tomorrow as this is an acute change; they recommended she go to the emergency department for evaluation by Dr Ozella Almond partner Dr Curley Spice. The ER was contacted, spoke with Tiffany, regarding this. Patient was accompanied by her son. The left anterior shin ulcer is improved in measurement, overtly exposed bone with area of discoloration/necrosis. will follow up after hospitalization/vascular intervention 12/31/17 on evaluation today patient is status post having gone to the ER last week on 12/23/17. 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 This was performed by Dr. Cleda Mccreedy on 12/23/17. Subsequently Dr. Corene Cornea do with [facility] performed percutaneous evaluation Alexa intervention of patient's left lower extremity on 12/27/17. Angioplasty was performed following angiogram of the left peroneal artery Alexa tibial angioplasty was also performed of the left popliteal artery. Angioplasty of the mid Alexa distal superficial femoral arteries was likewise undertaken. Stents were placed in the left above knee popliteal artery following angioplasty Alexa then a second stent placed in the mid- SFA secondary to 50% residual stenosis following the angioplasty. Fortunately patient was much better post procedure as far as  stenosis/occlusions were concerned Alexa she did have definitely much better blood flow which was noted to be evident on evaluation today. 01/07/18 on evaluation today patient appears to be doing well on her left lower extremity in regard to the anterior shin ulcer. She has been tolerating the dressing changes without complication. The good news is nothing appears to be doing worse Alexa in general she does seem to have more granulation tissue. There is still bone exposure. Fortunately she's been tolerating the dressing changes. Patient's a the eyes that Dr. Bunnie Domino office on 01/04/18 revealed a right ABI of 0.69 Alexa a left ABI of 0.81 obviously this is doing very well Alexa significantly improved. 01/12/18; patient with a traumatic wound on the left anterior tibial area. Fairly substantial wound was still open tibia however the amount of exposed bone is apparently a lot better. We have applied for TheraSkins but have not yet received approval. She has been using silver collagen as mentioned everything appears better. She had recent revascularization by [facility] Alexa had a left ABI of 0.81. She  is also recently had an amputation of her left great toe. This is still wrapped Alexa I did not look at this today, apparently we are not following secondary to this being a recent surgical wound. 01/19/18; patient with a traumatic wound on the left anterior tibial area. Only a small amount of open tibia still remains. We applied Theraskin #1. She has an another wound at the right great toe amputation site. This is being followed by her surgeon on a 89 day global. Our nurse inadvertently remove the wet to dry dressing Alexa reported there is exposed bone. We are not following this as of yet 02/02/18; she is back for a deep traumatic wound over the left anterior tibia. We applied the first TheraSkin 2 weeks ago. She returns today in follow-up. The wound is better there is no exposed bone. She has home health  going out they will change the superficial dressings on this in a week there also dealing with a right great toe amputation site, we are not following this as of yet 02/16/18-she is here in follow up evaluation for left lower extremity wound. Theraskin was reapplied. She saw Dr Cleda Mccreedy today who redressed her amputation site 03/02/18; patient is here for follow-up of her left lower extremity wound. We applied TheraSkin 2 weeks ago. She arrived with the wound bed completely granulated. This required debridement. Dimensions are better healthier looking wound. Nevertheless I didn't think a subsequent TheraSkin was necessary 03/09/18; traumatic wound on the left lower extremity with initially exposed tibia. We closed this over with TheraSkin o2. We've now used Hydrofera Blue starting last week.the daughter was present is doing most of dressing changes. She seems to want to discharge home health although she has an appointment next week with regards to her orthopedic issues in her left foot so they're going to keep home health to see if therapy is required. There is apparently an open area on the left foot as well we have not seen this Alexa haven't been following 03/23/18; traumatic wound on the left anterior lower extremity with initially with exposed tibia. This is closed over. The wound continues to contract Alexa is smaller. Surface looks healthy. She follows with orthopedics for an area on her left foot/great toe apparently this is doing better. We haven't looked at this.they no longer have home health care. Her daughter is changing the dressing using Hydrofera Blue 03/30/18; traumatic wound on the left anterior lower extremity with initially exposed tibia. The tibia is closed over. The wound continues to contract Alexa measures smaller. There is only a small part of the inferior part of this that is still open. She follows with orthopedics for an area on her left foot left great toe. Her daughter is changing  the dressing 04/06/18; traumatic wound on the left anterior lower leg which initially had exposed tibia. The tibia was closed over. We've been using Hydrofera Blue. Electronic Signature(s) Signed: 04/07/2018 1:16:01 PM By: Linton Ham MD Pena, [name] ([id number]) Entered By: Linton Ham on 04/06/2018 16:55:37 Lorah, [name] ([id number]) -------------------------------------------------------------------------------- Physical Exam Details Patient Name: [name]. Date of Service: 04/06/2018 3:15 PM Medical Record Number: [id number] Patient Account Number: [id number] Date of Birth/Sex: 1926-11-11 (84 y.o. F) Treating RN: Cornell Barman Primary Care Provider: Lucianne Lei Other Clinician: Referring Provider: Lucianne Lei Treating Provider/Extender: Tito Dine in Treatment: 45 Constitutional Patient is hypertensive.. Pulse regular Alexa within target range for DillonMarland Kitchen Respirations regular, non-labored Alexa within target range.. Temperature is normal Alexa within  the target range for the DillonMarland Kitchen appears in no distress. Notes one exam; the area on the left anterior tibia continues to look better although today she was covered in thick adherent eschar. I removed this with a #5 curet. She still has a small open area in the inferior part of the wound that has some depth. Most of this is superficial. Electronic Signature(s) Signed: 04/07/2018 1:16:01 PM By: Linton Ham MD Entered By: Linton Ham on 04/06/2018 16:56:39 [name], [name] ([id number]) -------------------------------------------------------------------------------- Physician Orders Details Patient Name: [name]. Date of Service: 04/06/2018 3:15 PM Medical Record Number: [id number] Patient Account Number: [id number] Date of Birth/Sex: 01-07-1926 (84 y.o. F) Treating RN: Cornell Barman Primary Care Provider: Lucianne Lei Other Clinician: Referring Provider: Lucianne Lei Treating  Provider/Extender: Tito Dine in Treatment: 31 Verbal / Phone Orders: No Diagnosis Coding Wound Cleansing Wound #1 Left,Anterior Lower Leg o Clean wound with Normal Saline. o Cleanse wound with mild soap Alexa water Anesthetic (add to Medication List) o Topical Lidocaine 4% cream applied to wound bed prior to debridement (In Clinic Only). Primary Wound Dressing Wound #1 Left,Anterior Lower Leg o Hydrafera Blue Ready Transfer Secondary Dressing o ABD Alexa Kerlix/Conform - coban to secure Dressing Change Frequency Wound #1 Left,Anterior Lower Leg o Other: - twice weekly Follow-up Appointments Wound #1 Left,Anterior Lower Leg o Return Appointment in 1 week. Electronic Signature(s) Signed: 04/06/2018 5:47:31 PM By: Gretta Cool, BSN, RN, CWS, Kim RN, BSN Signed: 04/07/2018 1:16:01 PM By: Linton Ham MD Entered By: Gretta Cool, BSN, RN, CWS, Kim on 04/06/2018 15:58:12 Neto, [name] ([id number]) -------------------------------------------------------------------------------- Problem List Details Patient Name: [name], [name]. Date of Service: 04/06/2018 3:15 PM Medical Record Number: [id number] Patient Account Number: [id number] Date of Birth/Sex: 09/02/1926 (84 y.o. 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 ([id number]) -------------------------------------------------------------------------------- Progress Note Details Patient Name: [name]. Date of Service: 04/06/2018 3:15 PM Medical Record Number: [id number] Patient Account Number: [id number] Date of Birth/Sex: [birth year] (84 y.o. F) Treating RN: Cornell Barman Primary Care Provider: Lucianne Lei Other Clinician: Referring Provider: Lucianne Lei Treating Provider/Extender: Tito Dine in Treatment: 44 Subjective History of Present Illness (HPI) 84 year old patient was seen in the ED about 10 days ago for a history of abrasion to the left lower extremity while she was boarding a bus Alexa scraped the anterior part of her left leg. She has been a diabetic for many years Alexa has been taking treatment regularly. Her past medical history is also significant for anemia arthritis constipation Alexa hypertension Alexa is status post abdominal hysterectomy. She has never been a smoker. after her ER visit she was advised to apply Bactroban ointment to the wound twice a day Alexa continue to monitor her blood glucose levels. her  last hemoglobin A1c was done 3 years ago Alexa was 6.6% 06/04/17 patient left anterior shin wound appears to be doing well although it is still somewhat dry despite the treatment with Medihoney. We have been using Kerlex following the Medihoney application Alexa I believe this is just not retaining that much moisture. Nonetheless there is no evidence of infection. 06/11/17 on evaluation today patient appears to be doing a little bit worse in regard to her wound. The entirety of the wound is dry Alexa unfortunately the Medihoney does not seem to be helping this. That is even with the dressing  changes that I made last week to try to retain more moisture. She has not tried [employer] as of yet. She was also noncompressible when testing for ABIs. 07/01/2017 -- she had a arterial studies done Alexa was seen by Dr. Lucky Cowboy. her noninvasive studies showed noncompressible vessels on the right but brisk waveforms Alexa digital pressures of 71 on the right consistent with only mild arterial insufficienc. On the left her ABI was 0.47 Alexa this may be falsely elevated due to calcification. No digital pressures were obtained on the left Alexa this is consistent with severe arterial insufficiency. this critical limb threatening situation on the left, would make wound healing difficult Alexa it represents a serious situation Alexa he has recommended an angiography with possible revascularization. The patient desired to defer the decision to she discuss with the family members. 07/09/17 on evaluation today patient appears to be doing fairly well in regard to her left anterior lower extremity wound. She has been tolerating the dressing changes without complication we have been utilizing Santyl at this time. She tells me that she is having no significant pain compared to what she has had in the past. She has decided after discussing with family that she is going to go forward with the surgery with Dr. dew. This is to restore better blood flow to left lower extremity. With that it does appear that her lower extremity wound is making some progress albeit slow. No fevers, chills, nausea, or vomiting noted at this time. 07/16/17 on evaluation today patient's wound appears to be doing roughly about the same although some of the slough is clearing off. [name] she has her appointment with vascular next week on Thursday the 23rd. With that being said her wound does not appear to be hurting as badly as it has in the past which is good news. No fevers, chills, nausea, or vomiting noted at this time. 07/30/2017 -- she had surgery on  07/22/2017 --indications being nonhealing ulcer on the left leg with a noninvasive study showing marked reduction in ABI of less than 0.5 Alexa no digital pressure on her left leg. she had a percutaneous transluminal angioplasty of the left common Alexa external iliac arteries. She then had a stent placed to the left external iliac arteries Alexa to the left common iliac artery. the left lower extremity showed occlusion of the common femoral artery Alexa the origins of the SFA Alexa the profunda femoris artery. The flow distally was poor Alexa there were multiple areas of high-grade stenosis or occlusion of the distal SFA Alexa the popliteal artery with only a diseased peroneal artery as runoff distally. She has a postop appointment on September 23 08/20/2017 -- she went back to the vascular office for an appointment on 08/12/2017 Alexa bilateral ABIs were done which were notable for moderate right lower extremity arterial disease Alexa unable to obtain left ankle brachial indices due to absent Doppler  velocities in the posterior tibial artery Alexa the anterior tibial artery. Monophasic waveforms to the left femoral Alexa Beecher, Aliegha [name]. ([id number]) popliteal artery. this was worrisome for a failure of stents placed Alexa lower extremity angiogram was recommended. as was done on 08/16/2017 Alexa the stents placed in the left eye Lex system had some mild narrowing at the iliac bifurcation Alexa the leading edge of the stent proximally was poor wall apposition with stenosis in the 50-60% range in the right common iliac artery. There was also occlusion of the common femoral artery Alexa the origin of the SFA Alexa profunda femoris artery. Flow distally was poor but they appeared to be multiple areas of high-grade stenosis or occlusion in the distal SFA Alexa popliteal artery with only a diseased peroneal artery. in another stent was placed Alexa deployed. The femoral occlusion would need to be treated surgically. 08/27/17 on  evaluation today patient appears to be doing about the same in regard to her left for extremity wound. Unfortunately secondary to her vascular status this is not a very well healing wound at this time. She has been tolerating the dressing changes she has some discomfort but mainly with cleansing of the wound not at other times. No fevers, chills, nausea, or vomiting noted at this time. 09/17/2017 -- the patient was recently discharged from the hospital where she was admitted between 09/08/2017 Alexa 09/10/2017, when she had a procedure on her left femoral artery with the endarterectomy Alexa a patch. she will be going back for review this coming week. 09/24/2017 -- was seen on 09/18/2017 in the ER, for bleeding from her wound which was debrided earlier during the day. At the time she was seen there was no active bleeding. A thrombin pad was placed over the area Alexa she was asked to change the outside dressing but come back to the wound center as planned. she was also seen at the [facility] vascular surgery office for a review after her surgery Alexa bilateral lower extremity ABI when compared to the previous ones showed minimal improvement of arterial blood flow. She was set up for a CT angiogram of the abdomen Alexa pelvis Alexa lower extremities to see the degree of peripheral arterial disease 10/28/2017 -- had a x-ray of the left tibia Alexa fibula done on 10/07/2017 -- showed no bony acute abnormality she was reviewed by Dr. Corene Cornea dew on 10/26/2017, Alexa he reviewed her CT angiogram Alexa this demonstrated a widely patent left femoral endarterectomy including the origins of the femoral artery. She also had moderate distal disease in the popliteal artery, distal SFA Alexa tibial disease. Her peroneal artery is a dominant runoff distally.he felt she had adequate perfusion for healing the mid calf level Alexa he is putting off all surgical intervention for now Alexa is going to see her back in 2-3 months for  noninvasive studies. She also has a new wound on the plantar aspect of the left big toe where she pulled a piece of skin Alexa has caused a superficial ulceration 11/19/17 on evaluation today patient appears to be doing decently well at this point in time. It does appear that her Wound VAC has been approved as ordered by Dr. Con Memos however she has not received that as of yet. Nonetheless the wound bed in general appears to have minimal slough noted at this time in a fairly good amount of granulation although there is to be a noted in the center of the wound. No fevers, chills, nausea, or vomiting  noted at this time. 12/02/17 on evaluation today patient appears to be doing about the same in regard to her left anterior shin wound. Unfortunately her left toe wound appears to still be doing about the same as far as the eschar covering. This has softened up a bit to the point that it is now loose Alexa could have selective debridement which would allow for the Santyl to work better. Fortunately she is having no pain. 12/10/17 on evaluation today patient appears to be doing fairly well in regard to her left anterior shin wound Alexa that it at least appears to be stable. With that being said she does have really no significant healing at this point this wound seems to be mainly just maintaining. Subsequently patient also has the left toe wound which does not seem to be progressing. There is still nonviable tissue overlying the surface of the wound Alexa this doesn't even seem to be loosening up very well at all. Alexa patient has been using the Santyl. I do think this may need selective debridement today. Fortunately there does not appear to be any evidence of infection although I am concerned about the possibility that patient's blood flow is not likely sufficient to be able to tolerate appropriate wound healing. This was discussed with patient today as well although obviously I would leave the determination up to  vascular surgery. She will be seeing Dr. dew on February 5 for repeat vascular studies Alexa then in office evaluation. 12/17/17 on evaluation today patient appears to be doing about the same in regard to her left shin Alexa left great toe ulcers. She has been performing the dressing changes as previously recommended over the past week. With that being said unfortunately nothing seems to be changing much in regard to the dimensions of the wound Alexa I do feel again that this may be related to vascular flow. Patient's daughter was present during the evaluation today Alexa we did discuss this as well. I will be detailed in greater detail in the plan. [name], Alexa ([id number]) 12/23/17 she is here in follow up evaluation of a left great toe Alexa left anterior leg ulcer. There is noted ischemic changes (urple, dry gangrene, shriveling effect laterally) to the left great toe, although not overtly cold. Contact was made to Dr Ozella Almond office regarding possibility of seeing her in the office today or tomorrow as this is an acute change; they recommended she go to the emergency department for evaluation by Dr Ozella Almond partner Dr Curley Spice. The ER was contacted, spoke with Tiffany, regarding this. Patient was accompanied by her son. The left anterior shin ulcer is improved in measurement, overtly exposed bone with area of discoloration/necrosis. will follow up after hospitalization/vascular intervention 12/31/17 on evaluation today patient is status post having gone to the ER last week on 12/23/17. 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 This was performed by Dr. Cleda Mccreedy on 12/23/17. Subsequently Dr. Corene Cornea do with [facility] performed percutaneous evaluation Alexa intervention of patient's left lower extremity on 12/27/17. Angioplasty was performed following angiogram of the left peroneal artery Alexa tibial angioplasty was also performed of the left popliteal artery.  Angioplasty of the mid Alexa distal superficial femoral arteries was likewise undertaken. Stents were placed in the left above knee popliteal artery following angioplasty Alexa then a second stent placed in the mid- SFA secondary to 50% residual stenosis following the angioplasty. Fortunately patient was much better post procedure as far  as stenosis/occlusions were concerned Alexa she did have definitely much better blood flow which was noted to be evident on evaluation today. 01/07/18 on evaluation today patient appears to be doing well on her left lower extremity in regard to the anterior shin ulcer. She has been tolerating the dressing changes without complication. The good news is nothing appears to be doing worse Alexa in general she does seem to have more granulation tissue. There is still bone exposure. Fortunately she's been tolerating the dressing changes. Patient's a the eyes that Dr. Bunnie Domino office on 01/04/18 revealed a right ABI of 0.69 Alexa a left ABI of 0.81 obviously this is doing very well Alexa significantly improved. 01/12/18; patient with a traumatic wound on the left anterior tibial area. Fairly substantial wound was still open tibia however the amount of exposed bone is apparently a lot better. We have applied for TheraSkins but have not yet received approval. She has been using silver collagen as mentioned everything appears better. She had recent revascularization by [facility] Alexa had a left ABI of 0.81. 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 This is still wrapped Alexa I did not look at this today, apparently we are not following secondary to this being a recent surgical wound. 01/19/18; patient with a traumatic wound on the left anterior tibial area. Only a small amount of open tibia still remains. We applied Theraskin #1. She has an another wound at the right great toe amputation site. This is being followed by her surgeon on a 45 day global. Our nurse  inadvertently remove the wet to dry dressing Alexa reported there is exposed bone. We are not following this as of yet 02/02/18; she is back for a deep traumatic wound over the left anterior tibia. We applied the first TheraSkin 2 weeks ago. She returns today in follow-up. The wound is better there is no exposed bone. She has home health going out they will change the superficial dressings on this in a week there also dealing with a right great toe amputation site, we are not following this as of yet 02/16/18-she is here in follow up evaluation for left lower extremity wound. Theraskin was reapplied. She saw Dr Cleda Mccreedy today who redressed her amputation site 03/02/18; patient is here for follow-up of her left lower extremity wound. We applied TheraSkin 2 weeks ago. She arrived with the wound bed completely granulated. This required debridement. Dimensions are better healthier looking wound. Nevertheless I didn't think a subsequent TheraSkin was necessary 03/09/18; traumatic wound on the left lower extremity with initially exposed tibia. We closed this over with TheraSkin o2. We've now used Hydrofera Blue starting last week.the daughter was present is doing most of dressing changes. She seems to want to discharge home health although she has an appointment next week with regards to her orthopedic issues in her left foot so they're going to keep home health to see if therapy is required. There is apparently an open area on the left foot as well we have not seen this Alexa haven't been following 03/23/18; traumatic wound on the left anterior lower extremity with initially with exposed tibia. This is closed over. The wound continues to contract Alexa is smaller. Surface looks healthy. She follows with orthopedics for an area on her left foot/great toe apparently this is doing better. We haven't looked at this.they no longer have home health care. Her daughter is changing the dressing using Hydrofera Blue 03/30/18;  traumatic wound  on the left anterior lower extremity with initially exposed tibia. The tibia is closed over. The wound continues to contract Alexa measures smaller. There is only a small part of the inferior part of this that is still open. She follows with orthopedics for an area on her left foot left great toe. Her daughter is changing the dressing 04/06/18; traumatic wound on the left anterior lower leg which initially had exposed tibia. The tibia was closed over. We've been using Hydrofera Blue. KALIMA, SAYLOR ([id number]) Objective Constitutional Patient is hypertensive.. Pulse regular Alexa within target range for DillonMarland Kitchen Respirations regular, non-labored Alexa within target range.. Temperature is normal Alexa within the target range for the DillonMarland Kitchen appears in no distress. Vitals Time Taken: 3:26 PM, Height: 65 in, Weight: 131 lbs, BMI: 21.8, Pulse: 87 bpm, Respiratory Rate: 18 breaths/min, Blood Pressure: 155/69 mmHg. General Notes: one exam; the area on the left anterior tibia continues to look better although today she was covered in thick adherent eschar. I removed this with a #5 curet. She still has a small open area in the inferior part of the wound that has some depth. Most of this is superficial. Integumentary (Hair, Skin) Wound #1 status is Open. Original cause of wound was Trauma. The wound is located on the Left,Anterior Lower Leg. The wound measures 0.6cm length x 0.3cm width x 0.1cm depth; 0.141cm^2 area Alexa 0.014cm^3 volume. There is muscle, Fat Layer (Subcutaneous Tissue) Exposed, Alexa fascia exposed. There is no tunneling or undermining noted. There is a medium amount of serosanguineous drainage noted. The wound margin is flat Alexa intact. There is medium (34-66%) red granulation within the wound bed. There is a medium (34-66%) amount of necrotic tissue within the wound bed including Eschar Alexa Adherent Slough. The periwound skin appearance exhibited: Scarring, Maceration,  Hemosiderin Staining. The periwound skin appearance did not exhibit: Callus, Crepitus, Excoriation, Induration, Rash, Dry/Scaly, Atrophie Blanche, Cyanosis, Ecchymosis, Mottled, Pallor, Rubor, Erythema. Periwound temperature was noted as No Abnormality. The periwound has tenderness on palpation. 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 There was a Selective/Open Wound Non-Viable Tissue Debridement with a total area of 0.18 sq cm performed by [name], MD. With the following instrument(s): Curette. to remove Non-Viable tissue/material Material removed includes Eschar after achieving pain control using Other Molesky, Tyannah [name]. ([id number]) (lidocaine 4%). No specimens were taken. A time out was conducted at 15:55, prior to the start of the procedure. There was no bleeding. The procedure was tolerated well with a pain level of 0 throughout Alexa a pain level of 0 following the procedure. Patient s Level of Consciousness post procedure was recorded as Awake Alexa Alert Alexa post-procedure vitals were taken including Temperature: 98.2 F, Pulse: 76 bpm, Respiratory Rate: 16 breaths/min, Blood Pressure: (113)/(53) mmHg. Post Debridement Measurements: 0.6cm length x 0.3cm width x 0.1cm depth; 0.014cm^3 volume. Character of Wound/Ulcer Post Debridement is stable. Severity of Tissue Post Debridement  is:  Fat layer exposed. Post procedure Diagnosis Wound #1: Same as Pre-Procedure Plan Wound Cleansing: Wound #1 Left,Anterior Lower Leg: Clean wound with Normal Saline. Cleanse wound with mild soap Alexa water Anesthetic (add to Medication List): Topical Lidocaine 4% cream applied to wound bed prior to debridement (In Clinic Only). Primary Wound Dressing: Wound #1 Left,Anterior Lower Leg: Hydrafera Blue Ready Transfer Secondary Dressing: ABD Alexa Kerlix/Conform - coban to secure Dressing Change Frequency: Wound #1 Left,Anterior Lower Leg: Other: - twice weekly Follow-up Appointments: Wound #1 Left,Anterior Lower Leg: Return Appointment in 1 week. #1 continue Hydrofera Blue/ABD/kerlix Alexa coban Electronic Signature(s) Signed: 04/07/2018 1:16:01 PM By: Linton Ham MD Entered By: Linton Ham on 04/06/2018 16:57:40 Gavilanes, Devany [name]. ([id number]) -------------------------------------------------------------------------------- SuperBill Details Patient Name: [name]. Date of Service: 04/06/2018 Medical Record Number: [id number] Patient Account Number: [id number] Date of Birth/Sex: 07-07-1926 (84 y.o. 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 I70.245 Atherosclerosis of native arteries of left leg with ulceration of other part of foot Facility Procedures CPT4 Code Description: [id number] - DEBRIDE WOUND 1ST 20 SQ CM OR < ICD-10 Diagnosis Description L97.824 Non-pressure chronic  ulcer of other part of left lower leg with necros Modifier: is of bone Quantity: 1 Physician Procedures CPT4 Code Description: [id number] - WC PHYS DEBR WO ANESTH 20 SQ CM ICD-10 Diagnosis Description L97.824 Non-pressure chronic ulcer of other part of left lower leg with necros Modifier: is of bone Quantity: 1 Electronic Signature(s) Signed: 04/07/2018 1:16:01 PM By: Linton Ham MD Entered By: Linton Ham on 04/06/2018 16:58:10

## 2018-04-13 ENCOUNTER — Ambulatory Visit: Payer: Medicare Other | Admitting: Internal Medicine

## 2018-04-20 ENCOUNTER — Encounter: Payer: Medicare Other | Admitting: Internal Medicine

## 2018-04-20 DIAGNOSIS — E11622 Type 2 diabetes mellitus with other skin ulcer: Secondary | ICD-10-CM | POA: Diagnosis not present

## 2018-04-22 NOTE — Progress Notes (Signed)
MADELINE, PHO ([id number]) Visit Report for 04/20/2018 Debridement Details Patient Name: [name], [name] Date of Service: 04/20/2018 1:00 PM Medical Record Number: [id number] Patient Account Number: [id number] Date of Birth/Sex: [birth year] (84 y.o. F) Treating RN: Cornell Barman Primary Care Provider: Lucianne Lei Other Clinician: Referring Provider: Lucianne Lei Treating Provider/Extender: Tito Dine in Treatment: 24 Debridement Performed for Wound #1 Left,Anterior Lower Leg Assessment: Performed By: Physician [name], MD Debridement Type: Debridement Severity of Tissue Pre Limited to breakdown of skin Debridement: Pre-procedure Verification/Time Yes - 13:08 Out Taken: Start Time: 13:08 Pain Control: Other : lidocaine 4% Total Area Debrided (L x [name]): 3 (cm) x 1 (cm) = 3 (cm) Tissue and other material Non-Viable, Eschar, Fibrin/Exudate debrided: Level: Non-Viable Tissue Debridement Description: Selective/Open Wound Instrument: Curette Bleeding: Minimum Hemostasis Achieved: Pressure End Time: 13:11 Procedural Pain: 0 Post Procedural Pain: 0 Response to Treatment: Procedure was tolerated well Level of Consciousness: Awake and Alert Post Procedure Vitals: Temperature: 97.5 Pulse: 84 Respiratory Rate: 16 Blood Pressure: Systolic Blood Pressure: 585 Diastolic Blood Pressure: 58 Post Debridement Measurements of Total Wound Length: (cm) 3 Width: (cm) 1 Depth: (cm) 0.1 Volume: (cm) 0.236 Character of Wound/Ulcer Post Debridement: Requires Further Debridement Severity of Tissue Post Debridement: Limited to breakdown of skin Post Procedure Diagnosis Same as Pre-procedure [name], [name] ([id number]) Electronic Signature(s) Signed: 04/20/2018 5:10:30 PM By: Linton Ham MD Signed: 04/20/2018 5:19:09 PM By: Gretta Cool, BSN, RN, CWS, Kim RN, BSN Entered By: Linton Ham on 04/20/2018 13:27:19 [name], [name]  ([id number]) -------------------------------------------------------------------------------- HPI Details Patient Name: Alexa Lowes P. Date of Service: 04/20/2018 1:00 PM Medical Record Number: [id number] Patient Account Number: [id number] Date of Birth/Sex: Aug 18, 1926 (84 y.o. F) Treating RN: Cornell Barman Primary Care Provider: Lucianne Lei Other Clinician: Referring Provider: Lucianne Lei Treating Provider/Extender: Tito Dine in Treatment: 4 History of Present Illness HPI Description: 84 year old patient was seen in the ED about 10 days ago for a history of abrasion to the left lower extremity while she was boarding a bus and scraped the anterior part of her left leg. She has been a diabetic for many years and has been taking treatment regularly. Her past medical history is also significant for anemia arthritis constipation and hypertension and is status post abdominal hysterectomy. She has never been a smoker. after her ER visit she was advised to apply Bactroban ointment to the wound twice a day and continue to monitor her blood glucose levels. her last hemoglobin A1c was done 3 years ago and was 6.6% 06/04/17 patient left anterior shin wound appears to be doing well although it is still somewhat dry despite the treatment with Medihoney. We have been using Kerlex following the Medihoney application and I believe this is just not retaining that much moisture. Nonetheless there is no evidence of infection. 06/11/17 on evaluation today patient appears to be doing a little bit worse in regard to her wound. The entirety of the wound is dry and unfortunately the Medihoney does not seem to be helping this. That is even with the dressing changes that I made last week to try to retain more moisture. She has not tried [employer] as of yet. She was also noncompressible when testing for ABIs. 07/01/2017 -- she had a arterial studies done and was seen by Dr. Lucky Cowboy. her noninvasive studies  showed noncompressible vessels on the right but brisk waveforms and digital pressures of 71 on the right consistent with only mild arterial insufficienc. On the left her ABI  was 0.47 and this may be falsely elevated due to calcification. No digital pressures were obtained on the left and this is consistent with severe arterial insufficiency. this critical limb threatening situation on the left, would make wound healing difficult and it represents a serious situation and he has recommended an angiography with possible revascularization. The patient desired to defer the decision to she discuss with the family members. 07/09/17 on evaluation today patient appears to be doing fairly well in regard to her left anterior lower extremity wound. She has been tolerating the dressing changes without complication we have been utilizing Santyl at this time. She tells me that she is having no significant pain compared to what she has had in the past. She has decided after discussing with family that she is going to go forward with the surgery with Dr. dew. This is to restore better blood flow to left lower extremity. With that it does appear that her lower extremity wound is making some progress albeit slow. No fevers, chills, nausea, or vomiting noted at this time. 07/16/17 on evaluation today patient's wound appears to be doing roughly about the same although some of the slough is clearing off. [name] she has her appointment with vascular next week on Thursday the 23rd. With that being said her wound does not appear to be hurting as badly as it has in the past which is good news. No fevers, chills, nausea, or vomiting noted at this time. 07/30/2017 -- she had surgery on 07/22/2017 --indications being nonhealing ulcer on the left leg with a noninvasive study showing marked reduction in ABI of less than 0.5 and no digital pressure on her left leg. she had a percutaneous transluminal angioplasty of the left common  and external iliac arteries. She then had a stent placed to the left external iliac arteries and to the left common iliac artery. 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 The flow distally was poor and there were multiple areas of high-grade stenosis or occlusion of the distal SFA and the popliteal artery with only a diseased peroneal artery as runoff distally. She has a postop appointment on September 23 08/20/2017 -- she went back to the vascular office for an appointment on 08/12/2017 and bilateral ABIs were done which were notable for moderate right lower extremity arterial disease and unable to obtain left ankle brachial indices due to absent Doppler velocities in the posterior tibial artery and the anterior tibial artery. Monophasic waveforms to the left femoral and popliteal artery. this was worrisome for a failure of stents placed and lower extremity angiogram was recommended. as was [name], [name] ([id number]) done on 08/16/2017 and the stents placed in the left eye Lex system had some mild narrowing at the iliac bifurcation and the leading edge of the stent proximally was poor wall apposition with stenosis in the 50-60% range in the right common iliac artery. There was also occlusion of the common femoral artery and the origin of the SFA and profunda femoris artery. Flow distally was poor but they appeared to be multiple areas of high-grade stenosis or occlusion in the distal SFA and popliteal artery with only a diseased peroneal artery. in another stent was placed and deployed. The femoral occlusion would need to be treated surgically. 08/27/17 on evaluation today patient appears to be doing about the same in regard to her left for extremity wound. Unfortunately secondary to her vascular status  this is not a very well healing wound at this time. She has been tolerating the dressing changes she  has some discomfort but mainly with cleansing of the wound not at other times. No fevers, chills, nausea, or vomiting noted at this time. 09/17/2017 -- the patient was recently discharged from the hospital where she was admitted between 09/08/2017 and 09/10/2017, when she had a procedure on her left femoral artery with the endarterectomy and a patch. she will be going back for review this coming week. 09/24/2017 -- was seen on 09/18/2017 in the ER, for bleeding from her wound which was debrided earlier during the day. At the time she was seen there was no active bleeding. A thrombin pad was placed over the area and she was asked to change the outside dressing but come back to the wound center as planned. she was also seen at the [facility] office for a review after her surgery and bilateral lower extremity ABI when compared to the previous ones showed minimal improvement of arterial blood flow. She was set up for a CT angiogram of the abdomen and pelvis and lower extremities to see the degree of peripheral arterial disease 10/28/2017 -- had a x-ray of the left tibia and fibula done on 10/07/2017 -- showed no bony acute abnormality she was reviewed by Dr. Corene Cornea dew on 10/26/2017, and he reviewed her CT angiogram and this demonstrated a widely patent left femoral endarterectomy including the origins of the femoral artery. She also had moderate distal disease in the popliteal artery, distal SFA and tibial disease. Her peroneal artery is a dominant runoff distally.he felt she had adequate perfusion for healing the mid calf level and he is putting off all surgical intervention for now and is going to see her back in 2-3 months for noninvasive studies. She also has a new wound on the plantar aspect of the left big toe where she pulled a piece of skin and has caused a superficial ulceration 11/19/17 on evaluation today patient appears to be doing decently well at this point in  time. It does appear that her Wound VAC has been approved as ordered by Dr. Con Memos however she has not received that as of yet. Nonetheless the wound bed in general appears to have minimal slough noted at this time in a fairly good amount of granulation although there is to be a noted in the center of the wound. No fevers, chills, nausea, or vomiting noted at this time. 12/02/17 on evaluation today patient appears to be doing about the same in regard to her left anterior shin wound. Unfortunately her left toe wound appears to still be doing about the same as far as the eschar covering. This has softened up a bit to the point that it is now loose and could have selective debridement which would allow for the Santyl to work better. Fortunately she is having no pain. 12/10/17 on evaluation today patient appears to be doing fairly well in regard to her left anterior shin wound and that it at least appears to be stable. With that being said she does have really no significant healing at this point this wound seems to be mainly just maintaining. Subsequently patient also has the left toe wound which does not seem to be progressing. There is still nonviable tissue overlying the surface of the wound and this doesn't even seem to be loosening up very well at all. And patient has been using the Santyl. I do think  this may need selective debridement today. Fortunately there does not appear to be any evidence of infection although I am concerned about the possibility that patient's blood flow is not likely sufficient to be able to tolerate appropriate wound healing. This was discussed with patient today as well although obviously I would leave the determination up to vascular surgery. She will be seeing Dr. dew on February 5 for repeat vascular studies and then in office evaluation. 12/17/17 on evaluation today patient appears to be doing about the same in regard to her left shin and left great toe ulcers. She has  been performing the dressing changes as previously recommended over the past week. With that being said unfortunately nothing seems to be changing much in regard to the dimensions of the wound and I do feel again that this may be related to vascular flow. Patient's daughter was present during the evaluation today and we did discuss this as well. I will be detailed in greater detail in the plan. [name], [name] ([id number]) 12/23/17 she is here in follow up evaluation of a left great toe and left anterior leg ulcer. There is noted ischemic changes (urple, dry gangrene, shriveling effect laterally) to the left great toe, although not overtly cold. Contact was made to Dr Ozella Almond office regarding possibility of seeing her in the office today or tomorrow as this is an acute change; they recommended she go to the emergency department for evaluation by Dr Ozella Almond partner Dr Curley Spice. The ER was contacted, spoke with Tiffany, regarding this. Patient was accompanied by her son. The left anterior shin ulcer is improved in measurement, overtly exposed bone with area of discoloration/necrosis. will follow up after hospitalization/vascular intervention 12/31/17 on evaluation today patient is status post having gone to the ER last week on 12/23/17. 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 This was performed by Dr. Cleda Mccreedy on 12/23/17. Subsequently Dr. Corene Cornea do with [facility] performed percutaneous evaluation and intervention of patient's left lower extremity on 12/27/17. Angioplasty was performed following angiogram of the left peroneal artery and tibial angioplasty was also performed of the left popliteal artery. Angioplasty of the mid and distal superficial femoral arteries was likewise undertaken. Stents were placed in the left above knee popliteal artery following angioplasty and then a second stent placed in the mid- SFA secondary to 50% residual stenosis following  the angioplasty. Fortunately patient was much better post procedure as far as stenosis/occlusions were concerned and she did have definitely much better blood flow which was noted to be evident on evaluation today. 01/07/18 on evaluation today patient appears to be doing well on her left lower extremity in regard to the anterior shin ulcer. She has been tolerating the dressing changes without complication. The good news is nothing appears to be doing worse and in general she does seem to have more granulation tissue. There is still bone exposure. Fortunately she's been tolerating the dressing changes. Patient's a the eyes that Dr. Bunnie Domino office on 01/04/18 revealed a right ABI of 0.69 and a left ABI of 0.81 obviously this is doing very well and significantly improved. 01/12/18; patient with a traumatic wound on the left anterior tibial area. Fairly substantial wound was still open tibia however the amount of exposed bone is apparently a lot better. We have applied for TheraSkins but have not yet received approval. She has been using silver collagen as mentioned everything appears better. She had recent revascularization by [facility] and had a left ABI of 0.81. 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 This is still wrapped and I did not look at this today, apparently we are not following secondary to this being a recent surgical wound. 01/19/18; patient with a traumatic wound on the left anterior tibial area. Only a small amount of open tibia still remains. We applied Theraskin #1. She has an another wound at the right great toe amputation site. This is being followed by her surgeon on a 20 day global. Our nurse inadvertently remove the wet to dry dressing and reported there is exposed bone. We are not following this as of yet 02/02/18; she is back for a deep traumatic wound over the left anterior tibia. We applied the first TheraSkin 2 weeks ago. She returns today in  follow-up. The wound is better there is no exposed bone. She has home health going out they will change the superficial dressings on this in a week there also dealing with a right great toe amputation site, we are not following this as of yet 02/16/18-she is here in follow up evaluation for left lower extremity wound. Theraskin was reapplied. She saw Dr Cleda Mccreedy today who redressed her amputation site 03/02/18; patient is here for follow-up of her left lower extremity wound. We applied TheraSkin 2 weeks ago. She arrived with the wound bed completely granulated. This required debridement. Dimensions are better healthier looking wound. Nevertheless I didn't think a subsequent TheraSkin was necessary 03/09/18; traumatic wound on the left lower extremity with initially exposed tibia. We closed this over with TheraSkin o2. We've now used Hydrofera Blue starting last week.the daughter was present is doing most of dressing changes. She seems to want to discharge home health although she has an appointment next week with regards to her orthopedic issues in her left foot so they're going to keep home health to see if therapy is required. There is apparently an open area on the left foot as well we have not seen this and haven't been following 03/23/18; traumatic wound on the left anterior lower extremity with initially with exposed tibia. This is closed over. The wound continues to contract and is smaller. Surface looks healthy. She follows with orthopedics for an area on her left foot/great toe apparently this is doing better. We haven't looked at this.they no longer have home health care. Her daughter is changing the dressing using Hydrofera Blue 03/30/18; traumatic wound on the left anterior lower extremity with initially exposed tibia. The tibia is closed over. The wound continues to contract and measures smaller. There is only a small part of the inferior part of this that is still open. She follows with  orthopedics for an area on her left foot left great toe. Her daughter is changing the dressing 04/06/18; traumatic wound on the left anterior lower leg which initially had exposed tibia. The tibia was closed over. We've been using Hydrofera Blue. 04/20/18; traumatic wound on the left anterior lower leg which initially had exposed tibia. The second time in a row she comes in with tightly adherent eschar over the surface of this wound over the tibia. Using a #5 curet this is removed. There is still small open areas linearly that remain. Most of the rest of this is epithelialized. [name], [name] ([id number]) Electronic Signature(s) Signed: 04/20/2018 5:10:30 PM By: Linton Ham MD Entered By: Linton Ham on 04/20/2018 13:28:48 [name] ([id number]) -------------------------------------------------------------------------------- Physical Exam Details Patient Name: Alexa Lowes  P. Date of Service: 04/20/2018 1:00 PM Medical Record Number: [id number] Patient Account Number: [id number] Date of Birth/Sex: 09-29-1926 (84 y.o. F) Treating RN: Cornell Barman Primary Care Provider: Lucianne Lei Other Clinician: Referring Provider: Lucianne Lei Treating Provider/Extender: Tito Dine in Treatment: 9 Constitutional Patient is hypertensive.. Pulse regular and within target range for patient.Marland Kitchen Respirations regular, non-labored and within target range.. Temperature is normal and within the target range for the patient.Marland Kitchen appears in no distress. Notes wound exam; the area on the left anterior tibia today again is covered by thick adherent eschar. I removed this with #5 curet removing eschar in February and a surface material. She has several small open areas with healthy-looking surfaces. All of this is superficial Electronic Signature(s) Signed: 04/20/2018 5:10:30 PM By: Linton Ham MD Entered By: Linton Ham on 04/20/2018 13:29:47 Petrelli, [name]  ([id number]) -------------------------------------------------------------------------------- Physician Orders Details Patient Name: Alexa Lowes P. Date of Service: 04/20/2018 1:00 PM Medical Record Number: [id number] Patient Account Number: [id number] Date of Birth/Sex: 04-Oct-1926 (84 y.o. F) Treating RN: Cornell Barman Primary Care Provider: Lucianne Lei Other Clinician: Referring Provider: Lucianne Lei Treating Provider/Extender: Tito Dine in Treatment: 70 Verbal / Phone Orders: No Diagnosis Coding Wound Cleansing Wound #1 Left,Anterior Lower Leg o Clean wound with Normal Saline. o Cleanse wound with mild soap and water Anesthetic (add to Medication List) o Topical Lidocaine 4% cream applied to wound bed prior to debridement (In Clinic Only). Primary Wound Dressing Wound #1 Left,Anterior Lower Leg o Silver Collagen Secondary Dressing o ABD and Kerlix/Conform - coban to secure Dressing Change Frequency Wound #1 Left,Anterior Lower Leg o Other: - twice weekly Follow-up Appointments Wound #1 Left,Anterior Lower Leg o Return Appointment in 1 week. Electronic Signature(s) Signed: 04/20/2018 5:10:30 PM By: Linton Ham MD Signed: 04/20/2018 5:19:09 PM By: Gretta Cool, BSN, RN, CWS, Kim RN, BSN Entered By: Gretta Cool, BSN, RN, CWS, Kim on 04/20/2018 13:12:45 [name], [name] ([id number]) -------------------------------------------------------------------------------- Problem List Details Patient Name: [name], [name] Date of Service: 04/20/2018 1:00 PM Medical Record Number: [id number] Patient Account Number: [id number] Date of Birth/Sex: 1926-10-25 (84 y.o. 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 ([id number]) -------------------------------------------------------------------------------- Progress Note Details Patient Name: Alexa Lowes P. Date of Service: 04/20/2018 1:00 PM Medical Record Number: [id number] Patient Account Number: [id number] Date of Birth/Sex: 01-08-1926 (84 y.o. F) Treating RN: Cornell Barman Primary Care Provider: Lucianne Lei Other Clinician: Referring Provider: Lucianne Lei Treating Provider/Extender: Tito Dine in Treatment: 46 Subjective History of Present Illness (HPI) 84 year old patient was seen in the ED about 10 days ago for a history of abrasion to the left lower extremity while she was boarding a bus and scraped the anterior part of her left leg. She has been a  diabetic for many years and has been taking treatment regularly. Her past medical history is also significant for anemia arthritis constipation and hypertension and is status post abdominal hysterectomy. She has never been a smoker. after her ER visit she was advised to apply Bactroban ointment to the wound twice a day and continue to monitor her blood glucose levels. her last hemoglobin A1c was done 3 years ago and was 6.6% 06/04/17 patient left anterior shin wound appears to be doing well although it is still somewhat dry despite the treatment  with Medihoney. We have been using Kerlex following the Medihoney application and I believe this is just not retaining that much moisture. Nonetheless there is no evidence of infection. 06/11/17 on evaluation today patient appears to be doing a little bit worse in regard to her wound. The entirety of the wound is dry and unfortunately the Medihoney does not seem to be helping this. That is even with the dressing changes that I made last week to try to retain more moisture. She has not tried [employer] as of yet. She was also noncompressible when testing for ABIs. 07/01/2017 -- she had a arterial studies done and was seen by Dr. Lucky Cowboy. her noninvasive studies showed noncompressible vessels on the right but brisk waveforms and digital pressures of 71 on the right consistent with only mild arterial insufficienc. On the left her ABI was 0.47 and this may be falsely elevated due to calcification. No digital pressures were obtained on the left and this is consistent with severe arterial insufficiency. this critical limb threatening situation on the left, would make wound healing difficult and it represents a serious situation and he has recommended an angiography with possible revascularization. The patient desired to defer the decision to she discuss with the family members. 07/09/17 on evaluation today patient appears to be doing fairly well in regard to her left anterior lower extremity wound. She has been tolerating the dressing changes without complication we have been utilizing Santyl at this time. She tells me that she is having no significant pain compared to what she has had in the past. She has decided after discussing with family that she is going to go forward with the surgery with Dr. dew. This is to restore better blood flow to left lower extremity. With that it does appear that her lower extremity wound is making some progress albeit slow. No fevers, chills, nausea, or vomiting noted at this  time. 07/16/17 on evaluation today patient's wound appears to be doing roughly about the same although some of the slough is clearing off. [name] she has her appointment with vascular next week on Thursday the 23rd. With that being said her wound does not appear to be hurting as badly as it has in the past which is good news. No fevers, chills, nausea, or vomiting noted at this time. 07/30/2017 -- she had surgery on 07/22/2017 --indications being nonhealing ulcer on the left leg with a noninvasive study showing marked reduction in ABI of less than 0.5 and no digital pressure on her left leg. she had a percutaneous transluminal angioplasty of the left common and external iliac arteries. She then had a stent placed to the left external iliac arteries and to the left common iliac artery. 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 The flow distally was poor and there were multiple areas of high-grade stenosis or occlusion of the distal SFA and  the popliteal artery with only a diseased peroneal artery as runoff distally. She has a postop appointment on September 23 08/20/2017 -- she went back to the vascular office for an appointment on 08/12/2017 and bilateral ABIs were done which were notable for moderate right lower extremity arterial disease and unable to obtain left ankle brachial indices due to absent Doppler velocities in the posterior tibial artery and the anterior tibial artery. Monophasic waveforms to the left femoral and Gall, Fraya P. ([id number]) popliteal artery. this was worrisome for a failure of stents placed and lower extremity angiogram was recommended. as was done on 08/16/2017 and the stents placed in the left eye Lex system had some mild narrowing at the iliac bifurcation and the leading edge of the stent proximally was poor wall apposition with stenosis in the 50-60% range in the right common  iliac artery. There was also occlusion of the common femoral artery and the origin of the SFA and profunda femoris artery. Flow distally was poor but they appeared to be multiple areas of high-grade stenosis or occlusion in the distal SFA and popliteal artery with only a diseased peroneal artery. in another stent was placed and deployed. The femoral occlusion would need to be treated surgically. 08/27/17 on evaluation today patient appears to be doing about the same in regard to her left for extremity wound. Unfortunately secondary to her vascular status this is not a very well healing wound at this time. She has been tolerating the dressing changes she has some discomfort but mainly with cleansing of the wound not at other times. No fevers, chills, nausea, or vomiting noted at this time. 09/17/2017 -- the patient was recently discharged from the hospital where she was admitted between 09/08/2017 and 09/10/2017, when she had a procedure on her left femoral artery with the endarterectomy and a patch. she will be going back for review this coming week. 09/24/2017 -- was seen on 09/18/2017 in the ER, for bleeding from her wound which was debrided earlier during the day. At the time she was seen there was no active bleeding. A thrombin pad was placed over the area and she was asked to change the outside dressing but come back to the wound center as planned. she was also seen at the  vein and vascular surgery office for a review after her surgery and bilateral lower extremity ABI when compared to the previous ones showed minimal improvement of arterial blood flow. She was set up for a CT angiogram of the abdomen and pelvis and lower extremities to see the degree of peripheral arterial disease 10/28/2017 -- had a x-ray of the left tibia and fibula done on 10/07/2017 -- showed no bony acute abnormality she was reviewed by Dr. Corene Cornea dew on 10/26/2017, and he reviewed her CT angiogram and this  demonstrated a widely patent left femoral endarterectomy including the origins of the femoral artery. She also had moderate distal disease in the popliteal artery, distal SFA and tibial disease. Her peroneal artery is a dominant runoff distally.he felt she had adequate perfusion for healing the mid calf level and he is putting off all surgical intervention for now and is going to see her back in 2-3 months for noninvasive studies. She also has a new wound on the plantar aspect of the left big toe where she pulled a piece of skin and has caused a superficial ulceration 11/19/17 on evaluation today patient appears to be doing decently well at this point in time. It does appear that  her Wound VAC has been approved as ordered by Dr. Con Memos however she has not received that as of yet. Nonetheless the wound bed in general appears to have minimal slough noted at this time in a fairly good amount of granulation although there is to be a noted in the center of the wound. No fevers, chills, nausea, or vomiting noted at this time. 12/02/17 on evaluation today patient appears to be doing about the same in regard to her left anterior shin wound. Unfortunately her left toe wound appears to still be doing about the same as far as the eschar covering. This has softened up a bit to the point that it is now loose and could have selective debridement which would allow for the Santyl to work better. Fortunately she is having no pain. 12/10/17 on evaluation today patient appears to be doing fairly well in regard to her left anterior shin wound and that it at least appears to be stable. With that being said she does have really no significant healing at this point this wound seems to be mainly just maintaining. Subsequently patient also has the left toe wound which does not seem to be progressing. There is still nonviable tissue overlying the surface of the wound and this doesn't even seem to be loosening up very well at  all. And patient has been using the Santyl. I do think this may need selective debridement today. Fortunately there does not appear to be any evidence of infection although I am concerned about the possibility that patient's blood flow is not likely sufficient to be able to tolerate appropriate wound healing. This was discussed with patient today as well although obviously I would leave the determination up to vascular surgery. She will be seeing Dr. dew on February 5 for repeat vascular studies and then in office evaluation. 12/17/17 on evaluation today patient appears to be doing about the same in regard to her left shin and left great toe ulcers. She has been performing the dressing changes as previously recommended over the past week. With that being said unfortunately nothing seems to be changing much in regard to the dimensions of the wound and I do feel again that this may be related to vascular flow. Patient's daughter was present during the evaluation today and we did discuss this as well. I will be detailed in greater detail in the plan. [name], [name] ([id number]) 12/23/17 she is here in follow up evaluation of a left great toe and left anterior leg ulcer. There is noted ischemic changes (urple, dry gangrene, shriveling effect laterally) to the left great toe, although not overtly cold. Contact was made to Dr Ozella Almond office regarding possibility of seeing her in the office today or tomorrow as this is an acute change; they recommended she go to the emergency department for evaluation by Dr Ozella Almond partner Dr Curley Spice. The ER was contacted, spoke with Tiffany, regarding this. Patient was accompanied by her son. The left anterior shin ulcer is improved in measurement, overtly exposed bone with area of discoloration/necrosis. will follow up after hospitalization/vascular intervention 12/31/17 on evaluation today patient is status post having gone to the ER last week on 12/23/17. At that point she  ended up having amputation of the left great toe. This was performed by Dr. Cleda Mccreedy on 12/23/17. Subsequently Dr. Corene Cornea do with [facility] performed percutaneous evaluation and intervention of patient's left lower extremity on 12/27/17. Angioplasty was performed following angiogram of the left peroneal artery and  tibial angioplasty was also performed of the left popliteal artery. Angioplasty of the mid and distal superficial femoral arteries was likewise undertaken. Stents were placed in the left above knee popliteal artery following angioplasty and then a second stent placed in the mid- SFA secondary to 50% residual stenosis following the angioplasty. Fortunately patient was much better post procedure as far as stenosis/occlusions were concerned and she did have definitely much better blood flow which was noted to be evident on evaluation today. 01/07/18 on evaluation today patient appears to be doing well on her left lower extremity in regard to the anterior shin ulcer. She has been tolerating the dressing changes without complication. The good news is nothing appears to be doing worse and in general she does seem to have more granulation tissue. There is still bone exposure. Fortunately she's been tolerating the dressing changes. Patient's a the eyes that Dr. Bunnie Domino office on 01/04/18 revealed a right ABI of 0.69 and a left ABI of 0.81 obviously this is doing very well and significantly improved. 01/12/18; patient with a traumatic wound on the left anterior tibial area. Fairly substantial wound was still open tibia however the amount of exposed bone is apparently a lot better. We have applied for TheraSkins but have not yet received approval. She has been using silver collagen as mentioned everything appears better. She had recent revascularization by [facility] and had a left ABI of 0.81. 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 This is  still wrapped and I did not look at this today, apparently we are not following secondary to this being a recent surgical wound. 01/19/18; patient with a traumatic wound on the left anterior tibial area. Only a small amount of open tibia still remains. We applied Theraskin #1. She has an another wound at the right great toe amputation site. This is being followed by her surgeon on a 70 day global. Our nurse inadvertently remove the wet to dry dressing and reported there is exposed bone. We are not following this as of yet 02/02/18; she is back for a deep traumatic wound over the left anterior tibia. We applied the first TheraSkin 2 weeks ago. She returns today in follow-up. The wound is better there is no exposed bone. She has home health going out they will change the superficial dressings on this in a week there also dealing with a right great toe amputation site, we are not following this as of yet 02/16/18-she is here in follow up evaluation for left lower extremity wound. Theraskin was reapplied. She saw Dr Cleda Mccreedy today who redressed her amputation site 03/02/18; patient is here for follow-up of her left lower extremity wound. We applied TheraSkin 2 weeks ago. She arrived with the wound bed completely granulated. This required debridement. Dimensions are better healthier looking wound. Nevertheless I didn't think a subsequent TheraSkin was necessary 03/09/18; traumatic wound on the left lower extremity with initially exposed tibia. We closed this over with TheraSkin o2. We've now used Hydrofera Blue starting last week.the daughter was present is doing most of dressing changes. She seems to want to discharge home health although she has an appointment next week with regards to her orthopedic issues in her left foot so they're going to keep home health to see if therapy is required. There is apparently an open area on the left foot as well we have not seen this and haven't been following 03/23/18;  traumatic wound on the left anterior lower  extremity with initially with exposed tibia. This is closed over. The wound continues to contract and is smaller. Surface looks healthy. She follows with orthopedics for an area on her left foot/great toe apparently this is doing better. We haven't looked at this.they no longer have home health care. Her daughter is changing the dressing using Hydrofera Blue 03/30/18; traumatic wound on the left anterior lower extremity with initially exposed tibia. The tibia is closed over. The wound continues to contract and measures smaller. There is only a small part of the inferior part of this that is still open. She follows with orthopedics for an area on her left foot left great toe. Her daughter is changing the dressing 04/06/18; traumatic wound on the left anterior lower leg which initially had exposed tibia. The tibia was closed over. We've been using Hydrofera Blue. 04/20/18; traumatic wound on the left anterior lower leg which initially had exposed tibia. The second time in a row she comes in with tightly adherent eschar over the surface of this wound over the tibia. Using a #5 curet this is removed. There is still small open areas linearly that remain. Most of the rest of this is epithelialized. [name], WELGE ([id number]) Objective Constitutional Patient is hypertensive.. Pulse regular and within target range for patient.Marland Kitchen Respirations regular, non-labored and within target range.. Temperature is normal and within the target range for the patient.Marland Kitchen appears in no distress. Vitals Time Taken: 12:52 PM, Height: 65 in, Weight: 131 lbs, BMI: 21.8, Temperature: 97.5 F, Pulse: 84 bpm, Respiratory Rate: 18 breaths/min, Blood Pressure: 146/58 mmHg. General Notes: wound exam; the area on the left anterior tibia today again is covered by thick adherent eschar. I removed this with #5 curet removing eschar in February and a surface material. She has several small  open areas with healthy-looking surfaces. All of this is superficial Integumentary (Hair, Skin) Wound #1 status is Open. Original cause of wound was Trauma. The wound is located on the Left,Anterior Lower Leg. The wound measures 3cm length x 1cm width x 0.1cm depth; 2.356cm^2 area and 0.236cm^3 volume. There is Fat Layer (Subcutaneous Tissue) Exposed exposed. There is no tunneling or undermining noted. There is a none present amount of drainage noted. The wound margin is flat and intact. There is medium (34-66%) red granulation within the wound bed. There is a medium (34-66%) amount of necrotic tissue within the wound bed including Eschar. The periwound skin appearance exhibited: Scarring, Maceration, Hemosiderin Staining. The periwound skin appearance did not exhibit: Callus, Crepitus, Excoriation, Induration, Rash, Dry/Scaly, Atrophie Blanche, Cyanosis, Ecchymosis, Mottled, Pallor, Rubor, Erythema. Periwound temperature was noted as No Abnormality. The periwound has tenderness on palpation. 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 There was a Selective/Open Wound Non-Viable Selley, Adanna P. ([id number]) Tissue Debridement with a total area of 3 sq cm performed by [name], MD. With the following instrument(s): Curette to remove Non-Viable tissue/material. Material removed  includes Eschar and Fibrin/Exudate and after achieving pain control using Other (lidocaine 4%). No specimens were taken. A time out was conducted at 13:08, prior to the start of the procedure. A Minimum amount of bleeding was controlled with Pressure. The procedure was tolerated well with a pain level of 0 throughout and a pain level of 0 following the procedure. Patient s Level of Consciousness post procedure was recorded as Awake and Alert and post-procedure vitals were taken including Temperature: 97.5 F, Pulse: 84 bpm, Respiratory Rate: 16 breaths/min, Blood Pressure: (146)/(58) mmHg. Post Debridement Measurements: 3cm length x 1cm width x 0.1cm depth; 0.236cm^3 volume. Character of Wound/Ulcer Post Debridement requires further debridement. Severity of Tissue Post Debridement is: Limited to breakdown of skin. Post procedure Diagnosis Wound #1: Same as Pre-Procedure Plan Wound Cleansing: Wound #1 Left,Anterior Lower Leg: Clean wound with Normal Saline. Cleanse wound with mild soap and water Anesthetic (add to Medication List): Topical Lidocaine 4% cream applied to wound bed prior to debridement (In Clinic Only). Primary Wound Dressing: Wound #1 Left,Anterior Lower Leg: Silver Collagen Secondary Dressing: ABD and Kerlix/Conform - coban to secure Dressing Change Frequency: Wound #1 Left,Anterior Lower Leg: Other: - twice weekly Follow-up Appointments: Wound #1 Left,Anterior Lower Leg: Return Appointment in 1 week. #1I change the primary dressing from [facility] to silver collagen which should be less adherent. ABD kerlix and conform. she has home health changing the dressing #2 I like to see this in one week versus 2 to see if we can get epithelialization over this area Electronic Signature(s) Signed: 04/20/2018 5:10:30 PM By: Linton Ham MD Entered By: Linton Ham on 04/20/2018 13:32:08 Boch, SAYTKZSW P.  ([id number]) -------------------------------------------------------------------------------- SuperBill Details Patient Name: Alexa Lowes P. Date of Service: 04/20/2018 Medical Record Number: [id number] Patient Account Number: [id number] Date of Birth/Sex: Nov 12, 1926 (84 y.o. 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 I70.245 Atherosclerosis of native arteries of left leg with ulceration of other part of foot Facility Procedures CPT4 Code Description: [id number] - DEBRIDE WOUND 1ST 20 SQ CM OR < ICD-10 Diagnosis Description L97.824 Non-pressure chronic ulcer of other part of left lower leg with necros Modifier: is of bone Quantity: 1 Physician Procedures CPT4 Code Description: [id number] - WC PHYS DEBR WO ANESTH 20 SQ CM ICD-10 Diagnosis Description L97.824 Non-pressure chronic ulcer of other part of left lower leg with necros Modifier: is of bone Quantity: 1 Electronic Signature(s) Signed: 04/20/2018 5:10:30 PM By: Linton Ham MD Entered By: Linton Ham on 04/20/2018 13:34:23

## 2018-04-22 NOTE — Progress Notes (Signed)
ILIANY, LOSIER ([id number]) Visit Report for 04/20/2018 Arrival Information Details Patient Name: [name], [name] P. Date of Service: 04/20/2018 1:00 PM Medical Record Number: [id number] Patient Account Number: [id number] Date of Birth/Sex: 10-30-1926 (84 y.o. F) Treating RN: Roger Shelter Primary Care Vance Hochmuth: Lucianne Lei Other Clinician: Referring Wilkin Lippy: Lucianne Lei Treating Mi Balla/Extender: Tito Dine in Treatment: 51 Visit Information History Since Last Visit All ordered tests and consults were completed: No Patient Arrived: Cane Added or deleted any medications: No Arrival Time: 12:51 Any new allergies or adverse reactions: No Accompanied By: self Had a fall or experienced change in No Transfer Assistance: None activities of daily living that may affect Patient Identification Verified: Yes risk of falls: Secondary Verification Process Yes Signs or symptoms of abuse/neglect since last visito No Completed: Hospitalized since last visit: No Patient Requires Transmission-Based No Implantable device outside of the clinic excluding No Precautions: cellular tissue based products placed in the center Patient Has Alerts: Yes since last visit: Patient Alerts: 01/04/2018 ABI @ Pain Present Now: No AVVS (R) 0.69 (L) 0.81 Electronic Signature(s) Signed: 04/20/2018 4:05:18 PM By: Roger Shelter Entered By: Roger Shelter on 04/20/2018 12:52:11 Lyons, [name] ([id number]) -------------------------------------------------------------------------------- Encounter Discharge Information Details Patient Name: Alexa Lowes P. Date of Service: 04/20/2018 1:00 PM Medical Record Number: [id number] Patient Account Number: [id number] Date of Birth/Sex: Mar 18, 1926 (84 y.o. F) Treating RN: Cornell Barman Primary Care Chandrika Sandles: Lucianne Lei Other Clinician: Referring Amare Kontos: Lucianne Lei Treating Jaleea Alesi/Extender: Tito Dine in Treatment:  98 Encounter Discharge Information Items Discharge Condition: Stable Ambulatory Status: Cane Discharge Destination: Home Transportation: Private Auto Accompanied By: self Schedule Follow-up Appointment: Yes Clinical Summary of Care: Electronic Signature(s) Signed: 04/20/2018 5:19:09 PM By: Gretta Cool, BSN, RN, CWS, Kim RN, BSN Entered By: Gretta Cool, BSN, RN, CWS, Kim on 04/20/2018 13:18:58 Millett, [name] ([id number]) -------------------------------------------------------------------------------- Lower Extremity Assessment Details Patient Name: Alexa Lowes P. Date of Service: 04/20/2018 1:00 PM Medical Record Number: [id number] Patient Account Number: [id number] Date of Birth/Sex: 1926/02/13 (84 y.o. F) Treating RN: Roger Shelter Primary Care Lanaiya Lantry: Lucianne Lei Other Clinician: Referring Adamary Savary: Lucianne Lei Treating Danasha Melman/Extender: Tito Dine in Treatment: 46 Edema Assessment Assessed: [Left: No] [Right: No] Edema: [Left: N] [Right: o] Vascular Assessment Claudication: Claudication Assessment [Left:None] Pulses: Posterior Tibial Extremity colors, hair growth, and conditions: Extremity Color: [Left:Normal] Hair Growth on Extremity: [Left:No] Temperature of Extremity: [Left:Warm] Electronic Signature(s) Signed: 04/20/2018 4:05:18 PM By: Roger Shelter Entered By: Roger Shelter on 04/20/2018 12:59:48 [name], [name] ([id number]) -------------------------------------------------------------------------------- Multi Wound Chart Details Patient Name: Alexa Lowes P. Date of Service: 04/20/2018 1:00 PM Medical Record Number: [id number] Patient Account Number: [id number] Date of Birth/Sex: [birth year] (84 y.o. F) Treating RN: Cornell Barman Primary Care Aslan Montagna: Lucianne Lei Other Clinician: Referring Markhi Kleckner: Lucianne Lei Treating Evey Mcmahan/Extender: Tito Dine in Treatment: 46 Vital Signs Height(in): 65 Pulse(bpm):  69 Weight(lbs): 131 Blood Pressure(mmHg): 146/58 Body Mass Index(BMI): 22 Temperature(F): 97.5 Respiratory Rate 18 (breaths/min): Photos: [1:No Photos] [N/A:N/A] Wound Location: [1:Left Lower Leg - Anterior] [N/A:N/A] Wounding Event: [1:Trauma] [N/A:N/A] Primary Etiology: [1:Diabetic Wound/Ulcer of the Lower Extremity] [N/A:N/A] Secondary Etiology: [1:Trauma, Other] [N/A:N/A] Comorbid History: [1:Anemia, Hypertension, Type II Diabetes, Osteoarthritis] [N/A:N/A] Date Acquired: [1:05/02/2017] [N/A:N/A] Weeks of Treatment: [1:46] [N/A:N/A] Wound Status: [1:Open] [N/A:N/A] Measurements L x W x D [1:3x1x0.1] [N/A:N/A] (cm) Area (cm) : [1:2.356] [N/A:N/A] Volume (cm) : [1:0.236] [N/A:N/A] % Reduction in Area: [1:51.00%] [N/A:N/A] % Reduction in Volume: [1:75.40%] [N/A:N/A] Classification: [1:Grade 2] [N/A:N/A] Exudate Amount: [1:None Present] [N/A:N/A] Wound Margin: [1:Flat  and Intact] [N/A:N/A] Granulation Amount: [1:Medium (34-66%)] [N/A:N/A] Granulation Quality: [1:Red] [N/A:N/A] Necrotic Amount: [1:Medium (34-66%)] [N/A:N/A] Necrotic Tissue: [1:Eschar] [N/A:N/A] Exposed Structures: [1:Fat Layer (Subcutaneous Tissue) Exposed: Yes Fascia: No Tendon: No Muscle: No Joint: No Bone: No] [N/A:N/A] Epithelialization: [1:Small (1-33%)] [N/A:N/A] Debridement: [1:Debridement - Selective/Open Wound] [N/A:N/A] Pre-procedure [1:13:08] [N/A:N/A] Verification/Time Out Taken: Pain Control: [1:Other] [N/A:N/A] Tissue Debrided: Necrotic/Eschar N/A N/A Level: Non-Viable Tissue N/A N/A Debridement Area (sq cm): 3 N/A N/A Instrument: Curette N/A N/A Bleeding: Minimum N/A N/A Hemostasis Achieved: Pressure N/A N/A Procedural Pain: 0 N/A N/A Post Procedural Pain: 0 N/A N/A Debridement Treatment Procedure was tolerated well N/A N/A Response: Post Debridement 3x1x0.1 N/A N/A Measurements L x W x D (cm) Post Debridement Volume: 0.236 N/A N/A (cm) Periwound Skin Texture: Scarring: Yes N/A  N/A Excoriation: No Induration: No Callus: No Crepitus: No Rash: No Periwound Skin Moisture: Maceration: Yes N/A N/A Dry/Scaly: No Periwound Skin Color: Hemosiderin Staining: Yes N/A N/A Atrophie Blanche: No Cyanosis: No Ecchymosis: No Erythema: No Mottled: No Pallor: No Rubor: No Temperature: No Abnormality N/A N/A Tenderness on Palpation: Yes N/A N/A Wound Preparation: Ulcer Cleansing: N/A N/A Rinsed/Irrigated with Saline Topical Anesthetic Applied: Other: lidocaine 4% Procedures Performed: Debridement N/A N/A Treatment Notes Wound #1 (Left, Anterior Lower Leg) 1. Cleansed with: Clean wound with Dakins/Anasept 2. Anesthetic Topical Lidocaine 4% cream to wound bed prior to debridement 4. Dressing Applied: Prisma Ag Notes kerlix and coban light wrap with tape, secured with netting Electronic Signature(s) Signed: 04/20/2018 5:10:30 PM By: Linton Ham MD [place], [facility] ([id number]) Entered By: Linton Ham on 04/20/2018 13:26:53 [name], [name] ([id number]) -------------------------------------------------------------------------------- [name] Details Patient Name: Alexa Lowes P. Date of Service: 04/20/2018 1:00 PM Medical Record Number: [id number] Patient Account Number: [id number] Date of Birth/Sex: 1926-08-22 (84 y.o. F) Treating RN: Cornell Barman Primary Care Broox Lonigro: Lucianne Lei Other Clinician: Referring Rox Mcgriff: Lucianne Lei Treating Bryssa Tones/Extender: Tito Dine in Treatment: 23 Active Inactive ` Abuse / Safety / Falls / Self Care Management Nursing Diagnoses: Potential for falls Goals: Patient will remain injury free related to falls Date Initiated: 05/28/2017 Target Resolution Date: 07/23/2017 Goal Status: Active Interventions: Assess fall risk on admission and as needed Notes: ` Orientation to the Wound Care Program Nursing Diagnoses: Knowledge deficit related to the wound healing center  program Goals: Patient/caregiver will verbalize understanding of the [facility] Program Date Initiated: 05/28/2017 Target Resolution Date: 07/23/2017 Goal Status: Active Interventions: Provide education on orientation to the wound center Notes: ` Wound/Skin Impairment Nursing Diagnoses: Impaired tissue integrity Goals: Ulcer/skin breakdown will have a volume reduction of 30% by week 4 Date Initiated: 05/28/2017 Target Resolution Date: 07/23/2017 Goal Status: Active Ulcer/skin breakdown will have a volume reduction of 50% by week 8 Date Initiated: 05/28/2017 Target Resolution Date: 07/23/2017 NAHIARA, [name] ([id number]) Goal Status: Active Ulcer/skin breakdown will have a volume reduction of 80% by week 12 Date Initiated: 05/28/2017 Target Resolution Date: 07/23/2017 Goal Status: Active Ulcer/skin breakdown will heal within 14 weeks Date Initiated: 05/28/2017 Target Resolution Date: 07/23/2017 Goal Status: Active Interventions: Assess patient/caregiver ability to obtain necessary supplies Assess patient/caregiver ability to perform ulcer/skin care regimen upon admission and as needed Assess ulceration(s) every visit Notes: Electronic Signature(s) Signed: 04/20/2018 5:19:09 PM By: Gretta Cool, BSN, RN, CWS, Kim RN, BSN Entered By: Gretta Cool, BSN, RN, CWS, Kim on 04/20/2018 13:09:20 Zalar, [name] ([id number]) -------------------------------------------------------------------------------- Pain Assessment Details Patient Name: Alexa Lowes P. Date of Service: 04/20/2018 1:00 PM Medical Record Number: [id number] Patient Account  Number: [id number] Date of Birth/Sex: 1926/07/16 (84 y.o. F) Treating RN: Roger Shelter Primary Care Ekta Dancer: Lucianne Lei Other Clinician: Referring Nathaly Dawkins: Lucianne Lei Treating Mathea Frieling/Extender: Tito Dine in Treatment: 13 Active Problems Location of Pain Severity and Description of Pain Patient Has Paino No Site  Locations Pain Management and Medication Current Pain Management: Electronic Signature(s) Signed: 04/20/2018 4:05:18 PM By: Roger Shelter Entered By: Roger Shelter on 04/20/2018 12:52:17 [name], [name] ([id number]) -------------------------------------------------------------------------------- Patient/Caregiver Education Details Patient Name: Alexa Lowes P. Date of Service: 04/20/2018 1:00 PM Medical Record Number: [id number] Patient Account Number: [id number] Date of Birth/Gender: 08-11-1926 (84 y.o. F) Treating RN: Cornell Barman Primary Care Physician: Lucianne Lei Other Clinician: Referring Physician: Lucianne Lei Treating Physician/Extender: Tito Dine in Treatment: 46 Education Assessment Education Provided To: Patient Education Topics Provided Wound/Skin Impairment: Handouts: Caring for Your Ulcer Methods: Demonstration, Explain/Verbal Responses: State content correctly Electronic Signature(s) Signed: 04/20/2018 5:19:09 PM By: Gretta Cool, BSN, RN, CWS, Kim RN, BSN Entered By: Gretta Cool, BSN, RN, CWS, Kim on 04/20/2018 13:19:08 [name], [name] ([id number]) -------------------------------------------------------------------------------- Wound Assessment Details Patient Name: Alexa Lowes P. Date of Service: 04/20/2018 1:00 PM Medical Record Number: [id number] Patient Account Number: [id number] Date of Birth/Sex: June 17, 1926 (84 y.o. F) Treating RN: Roger Shelter Primary Care Kyrstyn Greear: Lucianne Lei Other Clinician: Referring Janielle Mittel[place]: Lucianne Lei Treating Teofil Maniaci/Extender: Tito Dine in Treatment: 46 Wound Status Wound Number: 1 Primary Etiology: Diabetic Wound/Ulcer of the Lower Extremity Wound Location: Left Lower Leg - Anterior Secondary Trauma, Other Wounding Event: Trauma Etiology: Date Acquired: 05/02/2017 Wound Status: Open Weeks Of Treatment: 46 Comorbid Anemia, Hypertension, Type II Diabetes, Clustered Wound:  No History: Osteoarthritis Photos Photo Uploaded By: Roger Shelter on 04/20/2018 16:42:32 Wound Measurements Length: (cm) 3 Width: (cm) 1 Depth: (cm) 0.1 Area: (cm) 2.356 Volume: (cm) 0.236 % Reduction in Area: 51% % Reduction in Volume: 75.4% Epithelialization: Small (1-33%) Tunneling: No Undermining: No Wound Description Classification: Grade 2 Wound Margin: Flat and Intact Exudate Amount: None Present Foul Odor After Cleansing: No Slough/Fibrino Yes Wound Bed Granulation Amount: Medium (34-66%) Exposed Structure Granulation Quality: Red Fascia Exposed: No Necrotic Amount: Medium (34-66%) Fat Layer (Subcutaneous Tissue) Exposed: Yes Necrotic Quality: Eschar Tendon Exposed: No Muscle Exposed: No Joint Exposed: No Bone Exposed: No Periwound Skin Texture Texture Color No Abnormalities Noted: No No Abnormalities Noted: No [name], [name] ([id number]) Callus: No Atrophie Blanche: No Crepitus: No Cyanosis: No Excoriation: No Ecchymosis: No Induration: No Erythema: No Rash: No Hemosiderin Staining: Yes Scarring: Yes Mottled: No Pallor: No Moisture Rubor: No No Abnormalities Noted: No Dry / Scaly: No Temperature / Pain Maceration: Yes Temperature: No Abnormality Tenderness on Palpation: Yes Wound Preparation Ulcer Cleansing: Rinsed/Irrigated with Saline Topical Anesthetic Applied: Other: lidocaine 4%, Treatment Notes Wound #1 (Left, Anterior Lower Leg) 1. Cleansed with: Clean wound with Dakins/Anasept 2. Anesthetic Topical Lidocaine 4% cream to wound bed prior to debridement 4. Dressing Applied: Prisma Ag Notes kerlix and coban light wrap with tape, secured with netting Electronic Signature(s) Signed: 04/20/2018 4:05:18 PM By: Roger Shelter Entered By: Roger Shelter on 04/20/2018 12:59:22 [name], [name] ([id number]) -------------------------------------------------------------------------------- Vitals Details Patient Name:  Alexa Lowes P. Date of Service: 04/20/2018 1:00 PM Medical Record Number: [id number] Patient Account Number: [id number] Date of Birth/Sex: [birth year] (84 y.o. F) Treating RN: Roger Shelter Primary Care Noreene Boreman: Lucianne Lei Other Clinician: Referring Tauheed Mcfayden: Lucianne Lei Treating Dariusz Brase/Extender: Tito Dine in Treatment: 46 Vital Signs Time Taken: 12:52 Temperature (F): 97.5 Height (in): 65 Pulse (bpm): 84  Weight (lbs): 131 Respiratory Rate (breaths/min): 18 Body Mass Index (BMI): 21.8 Blood Pressure (mmHg): 146/58 Reference Range: 80 - 120 mg / dl Electronic Signature(s) Signed: 04/20/2018 4:05:18 PM By: Roger Shelter Entered By: Roger Shelter on 04/20/2018 12:52:41

## 2018-04-27 ENCOUNTER — Ambulatory Visit: Payer: Medicare Other | Admitting: Internal Medicine

## 2018-04-27 NOTE — Progress Notes (Signed)
[name], [name] ([id number]) Visit Report for 04/06/2018 Arrival Information Details Patient Name: [name], [name] Date of Service: 04/06/2018 3:15 PM Medical Record Number: [id number] Patient Account Number: [id number] Date of Birth/Sex: [birth year] (84 y.o. F) Treating RN: Ahmed Prima Primary Care Adja Ruff: Lucianne Lei Other Clinician: Referring Nichlas Pitera: Lucianne Lei Treating Keelie Zemanek/Extender: Tito Dine in Treatment: 18 Visit Information History Since Last Visit All ordered tests and consults were completed: No Patient Arrived: Cane Added or deleted any medications: No Arrival Time: 15:24 Any new allergies or adverse reactions: No Accompanied By: self Had a fall or experienced change in No Transfer Assistance: EasyPivot Patient activities of daily living that may affect Lift risk of falls: Patient Identification Verified: Yes Signs or symptoms of abuse/neglect since last visito No Secondary Verification Process Yes Hospitalized since last visit: No Completed: Implantable device outside of the clinic excluding No Patient Requires Transmission-Based No cellular tissue based products placed in the center Precautions: since last visit: Patient Has Alerts: Yes Has Dressing in Place as Prescribed: Yes Patient Alerts: 01/04/2018 ABI @ Pain Present Now: No AVVS (R) 0.69 (L) 0.81 Electronic Signature(s) Signed: 04/06/2018 4:48:17 PM By: Alric Quan Entered By: Alric Quan on 04/06/2018 15:25:55 Rezendes, [name] ([id number]) -------------------------------------------------------------------------------- Encounter Discharge Information Details Patient Name: Alexa Lowes P. Date of Service: 04/06/2018 3:15 PM Medical Record Number: [id number] Patient Account Number: [id number] Date of Birth/Sex: [birth year] (84 y.o. F) Treating RN: Cornell Barman Primary Care Constantinos Krempasky: Lucianne Lei Other Clinician: Referring Donie Lemelin: Lucianne Lei Treating  Pamala Hayman/Extender: Tito Dine in Treatment: 39 Encounter Discharge Information Items Discharge Condition: Stable Ambulatory Status: Cane Discharge Destination: Home Transportation: Private Auto Accompanied By: son Schedule Follow-up Appointment: Yes Clinical Summary of Care: Electronic Signature(s) Signed: 04/27/2018 7:45:54 AM By: Harold Barban Entered By: Harold Barban on 04/06/2018 16:02:02 [name], Alexa B. ([id number]) -------------------------------------------------------------------------------- Lower Extremity Assessment Details Patient Name: Alexa Lowes P. Date of Service: 04/06/2018 3:15 PM Medical Record Number: [id number] Patient Account Number: [id number] Date of Birth/Sex: 04-10-1926 (84 y.o. F) Treating RN: Ahmed Prima Primary Care Makye Radle: Lucianne Lei Other Clinician: Referring Zeba Luby: Lucianne Lei Treating Yakira Duquette/Extender: Tito Dine in Treatment: 43 Vascular Assessment Pulses: Dorsalis Pedis Palpable: [Left:Yes] Doppler Audible: [Left:Yes] Posterior Tibial Extremity colors, hair growth, and conditions: Extremity Color: [Left:Normal] Temperature of Extremity: [Left:Warm] Electronic Signature(s) Signed: 04/06/2018 4:48:17 PM By: Alric Quan Entered By: Alric Quan on 04/06/2018 15:37:25 [name], [name] ([id number]) -------------------------------------------------------------------------------- Multi Wound Chart Details Patient Name: Alexa Lowes P. Date of Service: 04/06/2018 3:15 PM Medical Record Number: [id number] Patient Account Number: [id number] Date of Birth/Sex: February 16, 1926 (84 y.o. F) Treating RN: Cornell Barman Primary Care Collyn Ribas: Lucianne Lei Other Clinician: Referring Gracen Southwell: Lucianne Lei Treating Antwine Agosto/Extender: Tito Dine in Treatment: 71 Vital Signs Height(in): 65 Pulse(bpm): 18 Weight(lbs): 131 Blood Pressure(mmHg): 155/69 Body Mass Index(BMI):  22 Temperature(F): Respiratory Rate 18 (breaths/min): Photos: [1:No Photos] [N/A:N/A] Wound Location: [1:Left Lower Leg - Anterior] [N/A:N/A] Wounding Event: [1:Trauma] [N/A:N/A] Primary Etiology: [1:Diabetic Wound/Ulcer of the Lower Extremity] [N/A:N/A] Secondary Etiology: [1:Trauma, Other] [N/A:N/A] Comorbid History: [1:Anemia, Hypertension, Type II Diabetes, Osteoarthritis] [N/A:N/A] Date Acquired: [1:05/02/2017] [N/A:N/A] Weeks of Treatment: [1:44] [N/A:N/A] Wound Status: [1:Open] [N/A:N/A] Measurements L x W x D [1:0.6x0.3x0.1] [N/A:N/A] (cm) Area (cm) : [1:0.141] [N/A:N/A] Volume (cm) : [1:0.014] [N/A:N/A] % Reduction in Area: [1:97.10%] [N/A:N/A] % Reduction in Volume: [1:98.50%] [N/A:N/A] Classification: [1:Grade 2] [N/A:N/A] Exudate Amount: [1:Medium] [N/A:N/A] Exudate Type: [1:Serosanguineous] [N/A:N/A] Exudate Color: [1:red, brown] [N/A:N/A] Wound Margin: [1:Flat and Intact] [N/A:N/A] Granulation Amount: [1:Medium (  34-66%)] [N/A:N/A] Granulation Quality: [1:Red] [N/A:N/A] Necrotic Amount: [1:Medium (34-66%)] [N/A:N/A] Necrotic Tissue: [1:Eschar, Adherent Slough] [N/A:N/A] Exposed Structures: [1:Fascia: Yes Fat Layer (Subcutaneous Tissue) Exposed: Yes Muscle: Yes Tendon: No Joint: No Bone: No] [N/A:N/A] Epithelialization: [1:Small (1-33%)] [N/A:N/A] Debridement: [1:Debridement - Selective/Open Wound 15:55] [N/A:N/A N/A] Pre-procedure Verification/Time Out Taken: Pain Control: Other N/A N/A Tissue Debrided: Necrotic/Eschar N/A N/A Level: Non-Viable Tissue N/A N/A Debridement Area (sq cm): 0.18 N/A N/A Instrument: Curette N/A N/A Bleeding: None N/A N/A Procedural Pain: 0 N/A N/A Post Procedural Pain: 0 N/A N/A Debridement Treatment Procedure was tolerated well N/A N/A Response: Post Debridement 0.6x0.3x0.1 N/A N/A Measurements L x W x D (cm) Post Debridement Volume: 0.014 N/A N/A (cm) Periwound Skin Texture: Scarring: Yes N/A N/A Excoriation:  No Induration: No Callus: No Crepitus: No Rash: No Periwound Skin Moisture: Maceration: Yes N/A N/A Dry/Scaly: No Periwound Skin Color: Hemosiderin Staining: Yes N/A N/A Atrophie Blanche: No Cyanosis: No Ecchymosis: No Erythema: No Mottled: No Pallor: No Rubor: No Temperature: No Abnormality N/A N/A Tenderness on Palpation: Yes N/A N/A Wound Preparation: Ulcer Cleansing: N/A N/A Rinsed/Irrigated with Saline Topical Anesthetic Applied: Other: lidocaine 4% Procedures Performed: Debridement N/A N/A Treatment Notes Wound #1 (Left, Anterior Lower Leg) 1. Cleansed with: Clean wound with Normal Saline 2. Anesthetic Topical Lidocaine 4% cream to wound bed prior to debridement 4. Dressing Applied: Dry Gauze Hydrafera Blue 5. Secondary Dressing Applied Kerlix/Conform Notes kerlix and coban light wrap with tape, secured with netting RAINN, ZUPKO ([id number]) Electronic Signature(s) Signed: 04/07/2018 1:16:01 PM By: Linton Ham MD Entered By: Linton Ham on 04/06/2018 16:52:54 [name], [name] ([id number]) -------------------------------------------------------------------------------- Multi-Disciplinary Care Plan Details Patient Name: Alexa Lowes P. Date of Service: 04/06/2018 3:15 PM Medical Record Number: [id number] Patient Account Number: [id number] Date of Birth/Sex: 11-17-1926 (84 y.o. F) Treating RN: Cornell Barman Primary Care Shakoya Gilmore: Lucianne Lei Other Clinician: Referring Martin Belling: Lucianne Lei Treating Lanaysia Fritchman/Extender: Tito Dine in Treatment: 47 Active Inactive ` Abuse / Safety / Falls / Self Care Management Nursing Diagnoses: Potential for falls Goals: Patient will remain injury free related to falls Date Initiated: 05/28/2017 Target Resolution Date: 07/23/2017 Goal Status: Active Interventions: Assess fall risk on admission and as needed Notes: ` Orientation to the Wound Care Program Nursing Diagnoses: Knowledge  deficit related to the wound healing center program Goals: Patient/caregiver will verbalize understanding of the [facility] Date Initiated: 05/28/2017 Target Resolution Date: 07/23/2017 Goal Status: Active Interventions: Provide education on orientation to the wound center Notes: ` Wound/Skin Impairment Nursing Diagnoses: Impaired tissue integrity Goals: Ulcer/skin breakdown will have a volume reduction of 30% by week 4 Date Initiated: 05/28/2017 Target Resolution Date: 07/23/2017 Goal Status: Active Ulcer/skin breakdown will have a volume reduction of 50% by week 8 Date Initiated: 05/28/2017 Target Resolution Date: 07/23/2017 [name], [name] ([id number]) Goal Status: Active Ulcer/skin breakdown will have a volume reduction of 80% by week 12 Date Initiated: 05/28/2017 Target Resolution Date: 07/23/2017 Goal Status: Active Ulcer/skin breakdown will heal within 14 weeks Date Initiated: 05/28/2017 Target Resolution Date: 07/23/2017 Goal Status: Active Interventions: Assess patient/caregiver ability to obtain necessary supplies Assess patient/caregiver ability to perform ulcer/skin care regimen upon admission and as needed Assess ulceration(s) every visit Notes: Electronic Signature(s) Signed: 04/06/2018 5:47:31 PM By: Gretta Cool, BSN, RN, CWS, Kim RN, BSN Entered By: Gretta Cool, BSN, RN, CWS, Kim on 04/06/2018 15:55:25 [name], [name] ([id number]) -------------------------------------------------------------------------------- Pain Assessment Details Patient Name: Alexa Lowes P. Date of Service: 04/06/2018 3:15 PM Medical Record Number: [id number] Patient Account  Number: [id number] Date of Birth/Sex: 01/20/1926 (84 y.o. F) Treating RN: Ahmed Prima Primary Care Mckenna Boruff: Lucianne Lei Other Clinician: Referring Kiala Faraj: Lucianne Lei Treating Adrinne Sze/Extender: Tito Dine in Treatment: 47 Active Problems Location of Pain Severity and Description of  Pain Patient Has Paino No Site Locations Pain Management and Medication Current Pain Management: Electronic Signature(s) Signed: 04/06/2018 4:48:17 PM By: Alric Quan Entered By: Alric Quan on 04/06/2018 15:26:04 [name] ([id number]) -------------------------------------------------------------------------------- Patient/Caregiver Education Details Patient Name: Alexa Lowes P. Date of Service: 04/06/2018 3:15 PM Medical Record Number: [id number] Patient Account Number: [id number] Date of Birth/Gender: [birth year] (84 y.o. F) Treating RN: Cornell Barman Primary Care Physician: Lucianne Lei Other Clinician: Referring Physician: Lucianne Lei Treating Physician/Extender: Tito Dine in Treatment: 11 Education Assessment Education Provided To: Patient Education Topics Provided Wound/Skin Impairment: Handouts: Caring for Your Ulcer Methods: Explain/Verbal Responses: State content correctly Electronic Signature(s) Signed: 04/27/2018 7:45:54 AM By: Harold Barban Entered By: Harold Barban on 04/06/2018 16:02:27 [name], [name] ([id number]) -------------------------------------------------------------------------------- Wound Assessment Details Patient Name: Alexa Lowes P. Date of Service: 04/06/2018 3:15 PM Medical Record Number: [id number] Patient Account Number: [id number] Date of Birth/Sex: [birth year] (84 y.o. F) Treating RN: Ahmed Prima Primary Care Signora Zucco: Lucianne Lei Other Clinician: Referring Ithzel Fedorchak: Lucianne Lei Treating Pericles Carmicheal/Extender: Tito Dine in Treatment: 44 Wound Status Wound Number: 1 Primary Etiology: Diabetic Wound/Ulcer of the Lower Extremity Wound Location: Left Lower Leg - Anterior Secondary Trauma, Other Wounding Event: Trauma Etiology: Date Acquired: 05/02/2017 Wound Status: Open Weeks Of Treatment: 44 Comorbid Anemia, Hypertension, Type II Diabetes, Clustered Wound: No History:  Osteoarthritis Photos Photo Uploaded By: Alric Quan on 04/06/2018 16:58:16 Wound Measurements Length: (cm) 0.6 Width: (cm) 0.3 Depth: (cm) 0.1 Area: (cm) 0.141 Volume: (cm) 0.014 % Reduction in Area: 97.1% % Reduction in Volume: 98.5% Epithelialization: Small (1-33%) Tunneling: No Undermining: No Wound Description Classification: Grade 2 Wound Margin: Flat and Intact Exudate Amount: Medium Exudate Type: Serosanguineous Exudate Color: red, brown Foul Odor After Cleansing: No Slough/Fibrino Yes Wound Bed Granulation Amount: Medium (34-66%) Exposed Structure Granulation Quality: Red Fascia Exposed: Yes Necrotic Amount: Medium (34-66%) Fat Layer (Subcutaneous Tissue) Exposed: Yes Necrotic Quality: Eschar, Adherent Slough Tendon Exposed: No Muscle Exposed: Yes Necrosis of Muscle: No Joint Exposed: No Bone Exposed: No Mandigo, Harlee P. ([id number]) Periwound Skin Texture Texture Color No Abnormalities Noted: No No Abnormalities Noted: No Callus: No Atrophie Blanche: No Crepitus: No Cyanosis: No Excoriation: No Ecchymosis: No Induration: No Erythema: No Rash: No Hemosiderin Staining: Yes Scarring: Yes Mottled: No Pallor: No Moisture Rubor: No No Abnormalities Noted: No Dry / Scaly: No Temperature / Pain Maceration: Yes Temperature: No Abnormality Tenderness on Palpation: Yes Wound Preparation Ulcer Cleansing: Rinsed/Irrigated with Saline Topical Anesthetic Applied: Other: lidocaine 4%, Electronic Signature(s) Signed: 04/06/2018 4:48:17 PM By: Alric Quan Entered By: Alric Quan on 04/06/2018 15:36:58 Ault, [name] ([id number]) -------------------------------------------------------------------------------- Vitals Details Patient Name: Alexa Lowes P. Date of Service: 04/06/2018 3:15 PM Medical Record Number: [id number] Patient Account Number: [id number] Date of Birth/Sex: 1926/03/15 (84 y.o. F) Treating RN: Ahmed Prima Primary Care Moise Friday: Lucianne Lei Other Clinician: Referring Donyae Kohn: Lucianne Lei Treating Shawnna Pancake/Extender: Tito Dine in Treatment: 44 Vital Signs Time Taken: 15:26 Pulse (bpm): 87 Height (in): 65 Respiratory Rate (breaths/min): 18 Weight (lbs): 131 Blood Pressure (mmHg): 155/69 Body Mass Index (BMI): 21.8 Reference Range: 80 - 120 mg / dl Electronic Signature(s) Signed: 04/06/2018 4:48:17 PM By: Alric Quan Entered By: Alric Quan on 04/06/2018 15:31:21

## 2018-05-02 DIAGNOSIS — D649 Anemia, unspecified: Secondary | ICD-10-CM | POA: Diagnosis not present

## 2018-05-02 DIAGNOSIS — I10 Essential (primary) hypertension: Secondary | ICD-10-CM | POA: Diagnosis not present

## 2018-05-02 DIAGNOSIS — N189 Chronic kidney disease, unspecified: Secondary | ICD-10-CM | POA: Diagnosis not present

## 2018-05-02 DIAGNOSIS — E039 Hypothyroidism, unspecified: Secondary | ICD-10-CM | POA: Diagnosis not present

## 2018-05-04 ENCOUNTER — Encounter: Payer: Medicare Other | Attending: Internal Medicine | Admitting: Internal Medicine

## 2018-05-04 DIAGNOSIS — I10 Essential (primary) hypertension: Secondary | ICD-10-CM | POA: Insufficient documentation

## 2018-05-04 DIAGNOSIS — L97522 Non-pressure chronic ulcer of other part of left foot with fat layer exposed: Secondary | ICD-10-CM | POA: Insufficient documentation

## 2018-05-04 DIAGNOSIS — E1151 Type 2 diabetes mellitus with diabetic peripheral angiopathy without gangrene: Secondary | ICD-10-CM | POA: Insufficient documentation

## 2018-05-04 DIAGNOSIS — L97824 Non-pressure chronic ulcer of other part of left lower leg with necrosis of bone: Secondary | ICD-10-CM | POA: Insufficient documentation

## 2018-05-04 DIAGNOSIS — M1990 Unspecified osteoarthritis, unspecified site: Secondary | ICD-10-CM | POA: Insufficient documentation

## 2018-05-04 DIAGNOSIS — E11621 Type 2 diabetes mellitus with foot ulcer: Secondary | ICD-10-CM | POA: Insufficient documentation

## 2018-05-04 DIAGNOSIS — I70245 Atherosclerosis of native arteries of left leg with ulceration of other part of foot: Secondary | ICD-10-CM | POA: Diagnosis not present

## 2018-05-04 DIAGNOSIS — L97822 Non-pressure chronic ulcer of other part of left lower leg with fat layer exposed: Secondary | ICD-10-CM | POA: Diagnosis not present

## 2018-05-04 DIAGNOSIS — Z90710 Acquired absence of both cervix and uterus: Secondary | ICD-10-CM | POA: Insufficient documentation

## 2018-05-04 DIAGNOSIS — I70242 Atherosclerosis of native arteries of left leg with ulceration of calf: Secondary | ICD-10-CM | POA: Insufficient documentation

## 2018-05-06 NOTE — Progress Notes (Addendum)
ANZLEE, HINESLEY ([id number]) Visit Report for 05/04/2018 HPI Details Patient Name: GAYNELLE, PASTRANA P. Date of Service: 05/04/2018 2:30 PM Medical Record Number: [id number] Patient Account Number: [id number] Date of Birth/Sex: Oct 07, 1926 (84 y.o. F) Treating RN: Cornell Barman Primary Care Provider: Lucianne Lei Other Clinician: Referring Provider: Lucianne Lei Treating Provider/Extender: Tito Dine in Treatment: 27 History of Present Illness HPI Description: 84 year old patient was seen in the ED about 10 days ago for a history of abrasion to the left lower extremity while she was boarding a bus and scraped the anterior part of her left leg. She has been a diabetic for many years and has been taking treatment regularly. Her past medical history is also significant for anemia arthritis constipation and hypertension and is status post abdominal hysterectomy. She has never been a smoker. after her ER visit she was advised to apply Bactroban ointment to the wound twice a day and continue to monitor her blood glucose levels. her last hemoglobin A1c was done 3 years ago and was 6.6% 06/04/17 patient left anterior shin wound appears to be doing well although it is still somewhat dry despite the treatment with Medihoney. We have been using Kerlex following the Medihoney application and I believe this is just not retaining that much moisture. Nonetheless there is no evidence of infection. 06/11/17 on evaluation today patient appears to be doing a little bit worse in regard to her wound. The entirety of the wound is dry and unfortunately the Medihoney does not seem to be helping this. That is even with the dressing changes that I made last week to try to retain more moisture. She has not tried [employer] as of yet. She was also noncompressible when testing for ABIs. 07/01/2017 -- she had a arterial studies done and was seen by Dr. Lucky Cowboy. her noninvasive studies showed noncompressible vessels on the  right but brisk waveforms and digital pressures of 71 on the right consistent with only mild arterial insufficienc. On the left her ABI was 0.47 and this may be falsely elevated due to calcification. No digital pressures were obtained on the left and this is consistent with severe arterial insufficiency. this critical limb threatening situation on the left, would make wound healing difficult and it represents a serious situation and he has recommended an angiography with possible revascularization. The patient desired to defer the decision to she discuss with the family members. 07/09/17 on evaluation today patient appears to be doing fairly well in regard to her left anterior lower extremity wound. She has been tolerating the dressing changes without complication we have been utilizing Santyl at this time. She tells me that she is having no significant pain compared to what she has had in the past. She has decided after discussing with family that she is going to go forward with the surgery with Dr. dew. This is to restore better blood flow to left lower extremity. With that it does appear that her lower extremity wound is making some progress albeit slow. No fevers, chills, nausea, or vomiting noted at this time. 07/16/17 on evaluation today patient's wound appears to be doing roughly about the same although some of the slough is clearing off. [name] she has her appointment with vascular next week on Thursday the 23rd. With that being said her wound does not appear to be hurting as badly as it has in the past which is good news. No fevers, chills, nausea, or vomiting noted at this time. 07/30/2017 -- she had surgery on  07/22/2017 --indications being nonhealing ulcer on the left leg with a noninvasive study showing marked reduction in ABI of less than 0.5 and no digital pressure on her left leg. she had a percutaneous transluminal angioplasty of the left common and external iliac arteries. She then had  a stent placed to the left external iliac arteries and to the left common iliac artery. 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 The flow distally was poor and there were multiple areas of high-grade stenosis or occlusion of the distal SFA and the popliteal artery with only a diseased peroneal artery as runoff distally. She has a postop appointment on September 23 08/20/2017 -- she went back to the vascular office for an appointment on 08/12/2017 and bilateral ABIs were done which Mitcheltree, Zakkiyya P. ([id number]) were notable for moderate right lower extremity arterial disease and unable to obtain left ankle brachial indices due to absent Doppler velocities in the posterior tibial artery and the anterior tibial artery. Monophasic waveforms to the left femoral and popliteal artery. this was worrisome for a failure of stents placed and lower extremity angiogram was recommended. as was done on 08/16/2017 and the stents placed in the left eye Lex system had some mild narrowing at the iliac bifurcation and the leading edge of the stent proximally was poor wall apposition with stenosis in the 50-60% range in the right common iliac artery. There was also occlusion of the common femoral artery and the origin of the SFA and profunda femoris artery. Flow distally was poor but they appeared to be multiple areas of high-grade stenosis or occlusion in the distal SFA and popliteal artery with only a diseased peroneal artery. in another stent was placed and deployed. The femoral occlusion would need to be treated surgically. 08/27/17 on evaluation today patient appears to be doing about the same in regard to her left for extremity wound. Unfortunately secondary to her vascular status this is not a very well healing wound at this time. She has been tolerating the dressing changes she has some discomfort but mainly with  cleansing of the wound not at other times. No fevers, chills, nausea, or vomiting noted at this time. 09/17/2017 -- the patient was recently discharged from the hospital where she was admitted between 09/08/2017 and 09/10/2017, when she had a procedure on her left femoral artery with the endarterectomy and a patch. she will be going back for review this coming week. 09/24/2017 -- was seen on 09/18/2017 in the ER, for bleeding from her wound which was debrided earlier during the day. At the time she was seen there was no active bleeding. A thrombin pad was placed over the area and she was asked to change the outside dressing but come back to the wound center as planned. she was also seen at the [facility] office for a review after her surgery and bilateral lower extremity ABI when compared to the previous ones showed minimal improvement of arterial blood flow. She was set up for a CT angiogram of the abdomen and pelvis and lower extremities to see the degree of peripheral arterial disease 10/28/2017 -- had a x-ray of the left tibia and fibula done on 10/07/2017 -- showed no bony acute abnormality she was reviewed by Dr. Corene Cornea dew on 10/26/2017, and he reviewed her CT angiogram and this demonstrated a widely patent left femoral endarterectomy including the origins of the femoral artery.  She also had moderate distal disease in the popliteal artery, distal SFA and tibial disease. Her peroneal artery is a dominant runoff distally.he felt she had adequate perfusion for healing the mid calf level and he is putting off all surgical intervention for now and is going to see her back in 2-3 months for noninvasive studies. She also has a new wound on the plantar aspect of the left big toe where she pulled a piece of skin and has caused a superficial ulceration 11/19/17 on evaluation today patient appears to be doing decently well at this point in time. It does appear that her  Wound VAC has been approved as ordered by Dr. Con Memos however she has not received that as of yet. Nonetheless the wound bed in general appears to have minimal slough noted at this time in a fairly good amount of granulation although there is to be a noted in the center of the wound. No fevers, chills, nausea, or vomiting noted at this time. 12/02/17 on evaluation today patient appears to be doing about the same in regard to her left anterior shin wound. Unfortunately her left toe wound appears to still be doing about the same as far as the eschar covering. This has softened up a bit to the point that it is now loose and could have selective debridement which would allow for the Santyl to work better. Fortunately she is having no pain. 12/10/17 on evaluation today patient appears to be doing fairly well in regard to her left anterior shin wound and that it at least appears to be stable. With that being said she does have really no significant healing at this point this wound seems to be mainly just maintaining. Subsequently patient also has the left toe wound which does not seem to be progressing. There is still nonviable tissue overlying the surface of the wound and this doesn't even seem to be loosening up very well at all. And patient has been using the Santyl. I do think this may need selective debridement today. Fortunately there does not appear to be any evidence of infection although I am concerned about the possibility that patient's blood flow is not likely sufficient to be able to tolerate appropriate wound healing. This was discussed with patient today as well although obviously I would leave the determination up to vascular surgery. She will be seeing Dr. dew on February 5 for repeat vascular studies and then in office evaluation. 12/17/17 on evaluation today patient appears to be doing about the same in regard to her left shin and left great toe ulcers. She has been performing the dressing  changes as previously recommended over the past week. With that being said unfortunately nothing seems to be changing much in regard to the dimensions of the wound and I do feel again that this may Rosal, Keili P. ([id number]) be related to vascular flow. Patient's daughter was present during the evaluation today and we did discuss this as well. I will be detailed in greater detail in the plan. 12/23/17 she is here in follow up evaluation of a left great toe and left anterior leg ulcer. There is noted ischemic changes (urple, dry gangrene, shriveling effect laterally) to the left great toe, although not overtly cold. Contact was made to Dr Ozella Almond office regarding possibility of seeing her in the office today or tomorrow as this is an acute change; they recommended she go to the emergency department for evaluation by Dr Ozella Almond partner Dr Curley Spice. The ER was  contacted, spoke with Tiffany, regarding this. Patient was accompanied by her son. The left anterior shin ulcer is improved in measurement, overtly exposed bone with area of discoloration/necrosis. will follow up after hospitalization/vascular intervention 12/31/17 on evaluation today patient is status post having gone to the ER last week on 12/23/17. 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 This was performed by Dr. Cleda Mccreedy on 12/23/17. Subsequently Dr. Corene Cornea do with [facility] performed percutaneous evaluation and intervention of patient's left lower extremity on 12/27/17. Angioplasty was performed following angiogram of the left peroneal artery and tibial angioplasty was also performed of the left popliteal artery. Angioplasty of the mid and distal superficial femoral arteries was likewise undertaken. Stents were placed in the left above knee popliteal artery following angioplasty and then a second stent placed in the mid- SFA secondary to 50% residual stenosis following the angioplasty. Fortunately  patient was much better post procedure as far as stenosis/occlusions were concerned and she did have definitely much better blood flow which was noted to be evident on evaluation today. 01/07/18 on evaluation today patient appears to be doing well on her left lower extremity in regard to the anterior shin ulcer. She has been tolerating the dressing changes without complication. The good news is nothing appears to be doing worse and in general she does seem to have more granulation tissue. There is still bone exposure. Fortunately she's been tolerating the dressing changes. Patient's a the eyes that Dr. Bunnie Domino office on 01/04/18 revealed a right ABI of 0.69 and a left ABI of 0.81 obviously this is doing very well and significantly improved. 01/12/18; patient with a traumatic wound on the left anterior tibial area. Fairly substantial wound was still open tibia however the amount of exposed bone is apparently a lot better. We have applied for TheraSkins but have not yet received approval. She has been using silver collagen as mentioned everything appears better. She had recent revascularization by  vein and vascular and had a left ABI of 0.81. 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 This is still wrapped and I did not look at this today, apparently we are not following secondary to this being a recent surgical wound. 01/19/18; patient with a traumatic wound on the left anterior tibial area. Only a small amount of open tibia still remains. We applied Theraskin #1. She has an another wound at the right great toe amputation site. This is being followed by her surgeon on a 19 day global. Our nurse inadvertently remove the wet to dry dressing and reported there is exposed bone. We are not following this as of yet 02/02/18; she is back for a deep traumatic wound over the left anterior tibia. We applied the first TheraSkin 2 weeks ago. She returns today in follow-up. The wound is better  there is no exposed bone. She has home health going out they will change the superficial dressings on this in a week there also dealing with a right great toe amputation site, we are not following this as of yet 02/16/18-she is here in follow up evaluation for left lower extremity wound. Theraskin was reapplied. She saw Dr Cleda Mccreedy today who redressed her amputation site 03/02/18; patient is here for follow-up of her left lower extremity wound. We applied TheraSkin 2 weeks ago. She arrived with the wound bed completely granulated. This required debridement. Dimensions are better healthier looking wound. Nevertheless I didn't think a subsequent TheraSkin  was necessary 03/09/18; traumatic wound on the left lower extremity with initially exposed tibia. We closed this over with TheraSkin o2. We've now used Hydrofera Blue starting last week.the daughter was present is doing most of dressing changes. She seems to want to discharge home health although she has an appointment next week with regards to her orthopedic issues in her left foot so they're going to keep home health to see if therapy is required. There is apparently an open area on the left foot as well we have not seen this and haven't been following 03/23/18; traumatic wound on the left anterior lower extremity with initially with exposed tibia. This is closed over. The wound continues to contract and is smaller. Surface looks healthy. She follows with orthopedics for an area on her left foot/great toe apparently this is doing better. We haven't looked at this.they no longer have home health care. Her daughter is changing the dressing using Hydrofera Blue 03/30/18; traumatic wound on the left anterior lower extremity with initially exposed tibia. The tibia is closed over. The wound continues to contract and measures smaller. There is only a small part of the inferior part of this that is still open. She follows with orthopedics for an area on her left  foot left great toe. Her daughter is changing the dressing 04/06/18; traumatic wound on the left anterior lower leg which initially had exposed tibia. The tibia was closed over. We've been using Hydrofera Blue. 04/20/18; traumatic wound on the left anterior lower leg which initially had exposed tibia. The second time in a row she comes Saulsbury, Ashyla P. ([id number]) in with tightly adherent eschar over the surface of this wound over the tibia. Using a #5 curet this is removed. There is still small open areas linearly that remain. Most of the rest of this is epithelialized. 05/04/18; traumatic wound on the left anterior tibial area which on presentation had exposed tibia. There is only 2 tiny open areas here one inferiorly and one superiorly. There is no drainage. No debridement is required Electronic Signature(s) Signed: 05/19/2018 9:21:15 AM By: Gretta Cool, BSN, RN, CWS, Kim RN, BSN Signed: 05/25/2018 6:35:58 PM By: Linton Ham MD Previous Signature: 05/04/2018 6:13:14 PM Version By: Linton Ham MD Entered By: Gretta Cool, BSN, RN, CWS, Kim on 05/19/2018 09:21:15 Kerri Perches, [name] ([id number]) -------------------------------------------------------------------------------- Physical Exam Details Patient Name: CLORISSA, GRUENBERG P. Date of Service: 05/04/2018 2:30 PM Medical Record Number: [id number] Patient Account Number: [id number] Date of Birth/Sex: September 18, 1926 (84 y.o. F) Treating RN: Cornell Barman Primary Care Provider: Lucianne Lei Other Clinician: Referring Provider: Lucianne Lei Treating Provider/Extender: Tito Dine in Treatment: 48 Constitutional Sitting or standing Blood Pressure is within target range for patient.. Pulse regular and within target range for patient.Marland Kitchen Respirations regular, non-labored and within target range.. Temperature is normal and within the target range for the patient.Marland Kitchen appears in no distress. Eyes Conjunctivae clear. No discharge. Respiratory Respiratory  effort is easy and symmetric bilaterally. Rate is normal at rest and on room air.. Cardiovascular Pedal pulses palpable and strong bilaterally.. Lymphatic none palpable in the left popliteal or inguinal area. Integumentary (Hair, Skin) no primary skin issues are seen. Notes wound exam oThe area on the left anterior tibia as only 2 small open areas. No debridement was required. It looks as though these will close by next week. Electronic Signature(s) Signed: 05/04/2018 6:13:14 PM By: Linton Ham MD Entered By: Linton Ham on 05/04/2018 17:24:10 Lozinski, [name] ([id number]) -------------------------------------------------------------------------------- Physician Orders Details Patient Name: [name]  P. Date of Service: 05/04/2018 2:30 PM Medical Record Number: [id number] Patient Account Number: [id number] Date of Birth/Sex: 01/25/1926 (84 y.o. F) Treating RN: Cornell Barman Primary Care Provider: Lucianne Lei Other Clinician: Referring Provider: Lucianne Lei Treating Provider/Extender: Tito Dine in Treatment: 91 Verbal / Phone Orders: No Diagnosis Coding Wound Cleansing Wound #1 Left,Anterior Lower Leg o Clean wound with Normal Saline. o Cleanse wound with mild soap and water Anesthetic (add to Medication List) Wound #1 Left,Anterior Lower Leg o Topical Lidocaine 4% cream applied to wound bed prior to debridement (In Clinic Only). Primary Wound Dressing Wound #1 Left,Anterior Lower Leg o Silver Collagen Secondary Dressing Wound #1 Left,Anterior Lower Leg o ABD and Kerlix/Conform - coban to secure Dressing Change Frequency Wound #1 Left,Anterior Lower Leg o Other: - twice weekly Follow-up Appointments Wound #1 Left,Anterior Lower Leg o Return Appointment in 2 weeks. Electronic Signature(s) Signed: 05/04/2018 6:13:14 PM By: Linton Ham MD Signed: 05/05/2018 2:15:42 PM By: Gretta Cool, BSN, RN, CWS, Kim RN, BSN Entered By: Gretta Cool, BSN, RN,  CWS, Kim on 05/04/2018 15:16:08 Kerri Perches, [name] ([id number]) -------------------------------------------------------------------------------- Problem List Details Patient Name: ADELENE, POLIVKA P. Date of Service: 05/04/2018 2:30 PM Medical Record Number: [id number] Patient Account Number: [id number] Date of Birth/Sex: 13-Jul-1926 (84 y.o. 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 ([id number]) -------------------------------------------------------------------------------- Progress Note Details Patient Name: [name] Date of Service: 05/04/2018 2:30 PM Medical Record Number: [id number] Patient Account Number: [id number] Date of Birth/Sex: December 13, 1925 (84 y.o. F) Treating RN: Cornell Barman Primary Care Provider: Lucianne Lei Other Clinician: Referring Provider: Lucianne Lei Treating Provider/Extender: Tito Dine in Treatment: 48 Subjective History of Present Illness (HPI) 84 year old patient was seen in the ED about 10 days ago for a history of abrasion to the left lower  extremity while she was boarding a bus and scraped the anterior part of her left leg. She has been a diabetic for many years and has been taking treatment regularly. Her past medical history is also significant for anemia arthritis constipation and hypertension and is status post abdominal hysterectomy. She has never been a smoker. after her ER visit she was advised to apply Bactroban ointment to the wound twice a day and continue to monitor her blood glucose levels. her last hemoglobin A1c was done 3 years ago and was 6.6% 06/04/17 patient left anterior shin wound appears to be doing well although it is still somewhat dry despite the treatment with Medihoney. We have been using Kerlex following the Medihoney application and I believe this is just not retaining that much moisture. Nonetheless there is no evidence of infection. 06/11/17 on evaluation today patient appears to be doing a little bit worse in regard to her wound. The entirety  of the wound is dry and unfortunately the Medihoney does not seem to be helping this. That is even with the dressing changes that I made last week to try to retain more moisture. She has not tried [employer] as of yet. She was also noncompressible when testing for ABIs. 07/01/2017 -- she had a arterial studies done and was seen by Dr. Lucky Cowboy. her noninvasive studies showed noncompressible vessels on the right but brisk waveforms and digital pressures of 71 on the right consistent with only mild arterial insufficienc. On the left her ABI was 0.47 and this may be falsely elevated due to calcification. No digital pressures were obtained on the left and this is consistent with severe arterial insufficiency. this critical limb threatening situation on the left, would make wound healing difficult and it represents a serious situation and he has recommended an angiography with possible revascularization. The patient desired to defer the decision to she discuss with the family  members. 07/09/17 on evaluation today patient appears to be doing fairly well in regard to her left anterior lower extremity wound. She has been tolerating the dressing changes without complication we have been utilizing Santyl at this time. She tells me that she is having no significant pain compared to what she has had in the past. She has decided after discussing with family that she is going to go forward with the surgery with Dr. dew. This is to restore better blood flow to left lower extremity. With that it does appear that her lower extremity wound is making some progress albeit slow. No fevers, chills, nausea, or vomiting noted at this time. 07/16/17 on evaluation today patient's wound appears to be doing roughly about the same although some of the slough is clearing off. [name] she has her appointment with vascular next week on Thursday the 23rd. With that being said her wound does not appear to be hurting as badly as it has in the past which is good news. No fevers, chills, nausea, or vomiting noted at this time. 07/30/2017 -- she had surgery on 07/22/2017 --indications being nonhealing ulcer on the left leg with a noninvasive study showing marked reduction in ABI of less than 0.5 and no digital pressure on her left leg. she had a percutaneous transluminal angioplasty of the left common and external iliac arteries. She then had a stent placed to the left external iliac arteries and to the left common iliac artery. 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 The flow distally was poor and there were multiple areas of high-grade stenosis or occlusion of the distal SFA and the popliteal artery with only a diseased peroneal artery as runoff distally. She has a postop appointment on September 23 08/20/2017 -- she went back to the vascular office for an appointment on 08/12/2017 and bilateral ABIs were done  which were notable for moderate right lower extremity arterial disease and unable to obtain left ankle brachial indices due to absent Doppler velocities in the posterior tibial artery and the anterior tibial artery. Monophasic waveforms to the left femoral and Remedios, Rickayla P. ([id number]) popliteal artery. this was worrisome for a failure of stents placed and lower extremity angiogram was recommended. as was done on 08/16/2017 and the stents placed in the left eye Lex system had some mild narrowing at the iliac bifurcation and the leading edge of the stent proximally was poor wall apposition with stenosis in the 50-60% range  in the right common iliac artery. There was also occlusion of the common femoral artery and the origin of the SFA and profunda femoris artery. Flow distally was poor but they appeared to be multiple areas of high-grade stenosis or occlusion in the distal SFA and popliteal artery with only a diseased peroneal artery. in another stent was placed and deployed. The femoral occlusion would need to be treated surgically. 08/27/17 on evaluation today patient appears to be doing about the same in regard to her left for extremity wound. Unfortunately secondary to her vascular status this is not a very well healing wound at this time. She has been tolerating the dressing changes she has some discomfort but mainly with cleansing of the wound not at other times. No fevers, chills, nausea, or vomiting noted at this time. 09/17/2017 -- the patient was recently discharged from the hospital where she was admitted between 09/08/2017 and 09/10/2017, when she had a procedure on her left femoral artery with the endarterectomy and a patch. she will be going back for review this coming week. 09/24/2017 -- was seen on 09/18/2017 in the ER, for bleeding from her wound which was debrided earlier during the day. At the time she was seen there was no active bleeding. A thrombin pad was placed over  the area and she was asked to change the outside dressing but come back to the wound center as planned. she was also seen at the [facility] office for a review after her surgery and bilateral lower extremity ABI when compared to the previous ones showed minimal improvement of arterial blood flow. She was set up for a CT angiogram of the abdomen and pelvis and lower extremities to see the degree of peripheral arterial disease 10/28/2017 -- had a x-ray of the left tibia and fibula done on 10/07/2017 -- showed no bony acute abnormality she was reviewed by Dr. Corene Cornea dew on 10/26/2017, and he reviewed her CT angiogram and this demonstrated a widely patent left femoral endarterectomy including the origins of the femoral artery. She also had moderate distal disease in the popliteal artery, distal SFA and tibial disease. Her peroneal artery is a dominant runoff distally.he felt she had adequate perfusion for healing the mid calf level and he is putting off all surgical intervention for now and is going to see her back in 2-3 months for noninvasive studies. She also has a new wound on the plantar aspect of the left big toe where she pulled a piece of skin and has caused a superficial ulceration 11/19/17 on evaluation today patient appears to be doing decently well at this point in time. It does appear that her Wound VAC has been approved as ordered by Dr. Con Memos however she has not received that as of yet. Nonetheless the wound bed in general appears to have minimal slough noted at this time in a fairly good amount of granulation although there is to be a noted in the center of the wound. No fevers, chills, nausea, or vomiting noted at this time. 12/02/17 on evaluation today patient appears to be doing about the same in regard to her left anterior shin wound. Unfortunately her left toe wound appears to still be doing about the same as far as the eschar covering. This has softened up a  bit to the point that it is now loose and could have selective debridement which would allow for the Santyl to work better. Fortunately she is having no pain. 12/10/17 on evaluation  today patient appears to be doing fairly well in regard to her left anterior shin wound and that it at least appears to be stable. With that being said she does have really no significant healing at this point this wound seems to be mainly just maintaining. Subsequently patient also has the left toe wound which does not seem to be progressing. There is still nonviable tissue overlying the surface of the wound and this doesn't even seem to be loosening up very well at all. And patient has been using the Santyl. I do think this may need selective debridement today. Fortunately there does not appear to be any evidence of infection although I am concerned about the possibility that patient's blood flow is not likely sufficient to be able to tolerate appropriate wound healing. This was discussed with patient today as well although obviously I would leave the determination up to vascular surgery. She will be seeing Dr. dew on February 5 for repeat vascular studies and then in office evaluation. 12/17/17 on evaluation today patient appears to be doing about the same in regard to her left shin and left great toe ulcers. She has been performing the dressing changes as previously recommended over the past week. With that being said unfortunately nothing seems to be changing much in regard to the dimensions of the wound and I do feel again that this may be related to vascular flow. Patient's daughter was present during the evaluation today and we did discuss this as well. I will be detailed in greater detail in the plan. CALANDRIA, MULLINGS ([id number]) 12/23/17 she is here in follow up evaluation of a left great toe and left anterior leg ulcer. There is noted ischemic changes (urple, dry gangrene, shriveling effect laterally) to  the left great toe, although not overtly cold. Contact was made to Dr Ozella Almond office regarding possibility of seeing her in the office today or tomorrow as this is an acute change; they recommended she go to the emergency department for evaluation by Dr Ozella Almond partner Dr Curley Spice. The ER was contacted, spoke with Tiffany, regarding this. Patient was accompanied by her son. The left anterior shin ulcer is improved in measurement, overtly exposed bone with area of discoloration/necrosis. will follow up after hospitalization/vascular intervention 12/31/17 on evaluation today patient is status post having gone to the ER last week on 12/23/17. 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 This was performed by Dr. Cleda Mccreedy on 12/23/17. Subsequently Dr. Corene Cornea do with [facility] performed percutaneous evaluation and intervention of patient's left lower extremity on 12/27/17. Angioplasty was performed following angiogram of the left peroneal artery and tibial angioplasty was also performed of the left popliteal artery. Angioplasty of the mid and distal superficial femoral arteries was likewise undertaken. Stents were placed in the left above knee popliteal artery following angioplasty and then a second stent placed in the mid- SFA secondary to 50% residual stenosis following the angioplasty. Fortunately patient was much better post procedure as far as stenosis/occlusions were concerned and she did have definitely much better blood flow which was noted to be evident on evaluation today. 01/07/18 on evaluation today patient appears to be doing well on her left lower extremity in regard to the anterior shin ulcer. She has been tolerating the dressing changes without complication. The good news is nothing appears to be doing worse and in general she does seem to have more granulation tissue. There is still bone exposure. Fortunately she's  been tolerating the dressing  changes. Patient's a the eyes that Dr. Bunnie Domino office on 01/04/18 revealed a right ABI of 0.69 and a left ABI of 0.81 obviously this is doing very well and significantly improved. 01/12/18; patient with a traumatic wound on the left anterior tibial area. Fairly substantial wound was still open tibia however the amount of exposed bone is apparently a lot better. We have applied for TheraSkins but have not yet received approval. She has been using silver collagen as mentioned everything appears better. She had recent revascularization by [facility] and had a left ABI of 0.81. 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 This is still wrapped and I did not look at this today, apparently we are not following secondary to this being a recent surgical wound. 01/19/18; patient with a traumatic wound on the left anterior tibial area. Only a small amount of open tibia still remains. We applied Theraskin #1. She has an another wound at the right great toe amputation site. This is being followed by her surgeon on a 12 day global. Our nurse inadvertently remove the wet to dry dressing and reported there is exposed bone. We are not following this as of yet 02/02/18; she is back for a deep traumatic wound over the left anterior tibia. We applied the first TheraSkin 2 weeks ago. She returns today in follow-up. The wound is better there is no exposed bone. She has home health going out they will change the superficial dressings on this in a week there also dealing with a right great toe amputation site, we are not following this as of yet 02/16/18-she is here in follow up evaluation for left lower extremity wound. Theraskin was reapplied. She saw Dr Cleda Mccreedy today who redressed her amputation site 03/02/18; patient is here for follow-up of her left lower extremity wound. We applied TheraSkin 2 weeks ago. She arrived with the wound bed completely granulated. This required debridement. Dimensions  are better healthier looking wound. Nevertheless I didn't think a subsequent TheraSkin was necessary 03/09/18; traumatic wound on the left lower extremity with initially exposed tibia. We closed this over with TheraSkin o2. We've now used Hydrofera Blue starting last week.the daughter was present is doing most of dressing changes. She seems to want to discharge home health although she has an appointment next week with regards to her orthopedic issues in her left foot so they're going to keep home health to see if therapy is required. There is apparently an open area on the left foot as well we have not seen this and haven't been following 03/23/18; traumatic wound on the left anterior lower extremity with initially with exposed tibia. This is closed over. The wound continues to contract and is smaller. Surface looks healthy. She follows with orthopedics for an area on her left foot/great toe apparently this is doing better. We haven't looked at this.they no longer have home health care. Her daughter is changing the dressing using Hydrofera Blue 03/30/18; traumatic wound on the left anterior lower extremity with initially exposed tibia. The tibia is closed over. The wound continues to contract and measures smaller. There is only a small part of the inferior part of this that is still open. She follows with orthopedics for an area on her left foot left great toe. Her daughter is changing the dressing 04/06/18; traumatic wound on the left anterior lower leg which initially had exposed tibia. The tibia was closed over. We've been using  Hydrofera Blue. 04/20/18; traumatic wound on the left anterior lower leg which initially had exposed tibia. The second time in a row she comes in with tightly adherent eschar over the surface of this wound over the tibia. Using a #5 curet this is removed. There is still small open areas linearly that remain. Most of the rest of this is epithelialized. JAYNELL, CASTAGNOLA  ([id number]) 04/30/18; traumatic wound on the left anterior tibial area which on presentation had exposed tibia. There is only 2 tiny open areas here one inferiorly and one superiorly. There is no drainage. No debridement is required Objective Constitutional Sitting or standing Blood Pressure is within target range for patient.. Pulse regular and within target range for patient.Marland Kitchen Respirations regular, non-labored and within target range.. Temperature is normal and within the target range for the patient.Marland Kitchen appears in no distress. Vitals Time Taken: 2:51 PM, Height: 65 in, Weight: 131 lbs, BMI: 21.8, Temperature: 97.5 F, Pulse: 81 bpm, Respiratory Rate: 18 breaths/min, Blood Pressure: 143/56 mmHg. Eyes Conjunctivae clear. No discharge. Respiratory Respiratory effort is easy and symmetric bilaterally. Rate is normal at rest and on room air.. Cardiovascular Pedal pulses palpable and strong bilaterally.. Lymphatic none palpable in the left popliteal or inguinal area. General Notes: wound exam The area on the left anterior tibia as only 2 small open areas. No debridement was required. It looks as though these will close by next week. Integumentary (Hair, Skin) no primary skin issues are seen. Wound #1 status is Open. Original cause of wound was Trauma. The wound is located on the Left,Anterior Lower Leg. The wound measures 2.9cm length x 0.2cm width x 0.1cm depth; 0.456cm^2 area and 0.046cm^3 volume. There is Fat Layer (Subcutaneous Tissue) Exposed exposed. There is no tunneling or undermining noted. There is a small amount of serosanguineous drainage noted. The wound margin is flat and intact. There is large (67-100%) red granulation within the wound bed. There is no necrotic tissue within the wound bed. The periwound skin appearance exhibited: Scarring, Maceration, Hemosiderin Staining. The periwound skin appearance did not exhibit: Callus, Crepitus, Excoriation, Induration, Rash, Dry/Scaly,  Atrophie Blanche, Cyanosis, Ecchymosis, Mottled, Pallor, Rubor, Erythema. Periwound temperature was noted as No Abnormality. The periwound has tenderness on palpation. Assessment Active Problems ICD-10 LAFAWN, LENOIR. 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 Cleanse wound with mild soap and water Anesthetic (add to Medication List): Wound #1 Left,Anterior Lower Leg: Topical Lidocaine 4% cream applied to wound bed prior to debridement (In Clinic Only). Primary Wound Dressing: Wound #1 Left,Anterior Lower Leg: Silver Collagen Secondary Dressing: Wound #1 Left,Anterior Lower Leg: ABD and Kerlix/Conform - coban to secure Dressing Change Frequency: Wound #1 Left,Anterior Lower Leg: Other: - twice weekly Follow-up Appointments: Wound #1 Left,Anterior Lower Leg: Return Appointment in 2 weeks. #1 no debridement is required here #2we continued with silver collagen kerlix and conform Electronic Signature(s) Signed: 05/04/2018 6:13:14 PM By: Linton Ham MD Entered By: Linton Ham on 05/04/2018 17:30:19 Kady, Kala P. ([id number]) -------------------------------------------------------------------------------- SuperBill Details Patient Name: [name] Date of Service: 05/04/2018 Medical Record Number: [id number] Patient Account Number: [id number] Date of Birth/Sex:  1926/03/26 (84 y.o. 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 I70.245 Atherosclerosis of native arteries of left leg with ulceration of other part of foot Facility Procedures CPT4 Code: [id number] Description: 52174 - WOUND CARE VISIT-LEV 2 EST PT Modifier: Quantity: 1 Physician Procedures CPT4 Code Description: [id number] [place] - WC PHYS LEVEL 3 - EST PT ICD-10 Diagnosis Description L97.824 Non-pressure chronic ulcer of other part of left lower leg with n E11.622 Type 2 diabetes mellitus with other skin ulcer Modifier: ecrosis of bone Quantity: 1 Electronic Signature(s) Signed: 05/04/2018 6:13:14 PM By: Linton Ham MD Entered By: Linton Ham on 05/04/2018 17:30:47

## 2018-05-06 NOTE — Progress Notes (Signed)
[name], [name] ([id number]) Visit Report for 05/04/2018 Arrival Information Details Patient Name: [name], [name] Date of Service: 05/04/2018 2:30 PM Medical Record Number: [id number] Patient Account Number: [id number] Date of Birth/Sex: 1926/03/31 (84 y.o. F) Treating RN: Ahmed Prima Primary Care Kenden Brandt: Lucianne Lei Other Clinician: Referring Cinque Begley: Lucianne Lei Treating Kristyanna Barcelo/Extender: Tito Dine in Treatment: 47 Visit Information History Since Last Visit All ordered tests and consults were completed: No Patient Arrived: [name] Added or deleted any medications: No Arrival Time: 14:49 Any new allergies or adverse reactions: No Accompanied By: grandaughter Had a fall or experienced change in No Transfer Assistance: None activities of daily living that may affect Patient Requires Transmission-Based No risk of falls: Precautions: Signs or symptoms of abuse/neglect since last visito No Patient Has Alerts: Yes Hospitalized since last visit: No Patient Alerts: 01/04/2018 ABI @ Implantable device outside of the clinic excluding No AVVS cellular tissue based products placed in the center (R) 0.69 (L) 0.81 since last visit: Has Dressing in Place as Prescribed: Yes Has Compression in Place as Prescribed: Yes Pain Present Now: No Electronic Signature(s) Signed: 05/05/2018 2:43:37 PM By: Alric Quan Entered By: Alric Quan on 05/04/2018 14:51:40 [name], [name] ([id number]) -------------------------------------------------------------------------------- Clinic Level of Care Assessment Details Patient Name: Alexa Lowes P. Date of Service: 05/04/2018 2:30 PM Medical Record Number: [id number] Patient Account Number: [id number] Date of Birth/Sex: [birth year] (84 y.o. F) Treating RN: Cornell Barman Primary Care Azaiah Licciardi: Lucianne Lei Other Clinician: Referring Borghild Thaker: Lucianne Lei Treating Syre Knerr/Extender: Tito Dine in Treatment:  16 Clinic Level of Care Assessment Items TOOL 4 Quantity Score []  - Use when only an EandM is performed on FOLLOW-UP visit 0 ASSESSMENTS - Nursing Assessment / Reassessment []  - Reassessment of Co-morbidities (includes updates in patient status) 0 X- 1 5 Reassessment of Adherence to Treatment Plan ASSESSMENTS - Wound and Skin Assessment / Reassessment X - Simple Wound Assessment / Reassessment - one wound 1 5 []  - 0 Complex Wound Assessment / Reassessment - multiple wounds []  - 0 Dermatologic / Skin Assessment (not related to wound area) ASSESSMENTS - Focused Assessment []  - Circumferential Edema Measurements - multi extremities 0 []  - 0 Nutritional Assessment / Counseling / Intervention []  - 0 Lower Extremity Assessment (monofilament, tuning fork, pulses) []  - 0 Peripheral Arterial Disease Assessment (using hand held doppler) ASSESSMENTS - Ostomy and/or Continence Assessment and Care []  - Incontinence Assessment and Management 0 []  - 0 Ostomy Care Assessment and Management (repouching, etc.) PROCESS - Coordination of Care X - Simple Patient / Family Education for ongoing care 1 15 []  - 0 Complex (extensive) Patient / Family Education for ongoing care []  - 0 Staff obtains Programmer, systems, Records, Test Results / Process Orders []  - 0 Staff telephones HHA, Nursing Homes / Clarify orders / etc []  - 0 Routine Transfer to another Facility (non-emergent condition) []  - 0 Routine Hospital Admission (non-emergent condition) []  - 0 New Admissions / Biomedical engineer / Ordering NPWT, Apligraf, etc. []  - 0 Emergency Hospital Admission (emergent condition) X- 1 10 Simple Discharge Coordination [name], [name] ([id number]) []  - 0 Complex (extensive) Discharge Coordination PROCESS - Special Needs []  - Pediatric / Minor Patient Management 0 []  - 0 Isolation Patient Management []  - 0 Hearing / Language / Visual special needs []  - 0 Assessment of Community assistance  (transportation, D/C planning, etc.) []  - 0 Additional assistance / Altered mentation []  - 0 Support Surface(s) Assessment (bed, cushion, seat, etc.) INTERVENTIONS - Wound Cleansing / Measurement  X - Simple Wound Cleansing - one wound 1 5 []  - 0 Complex Wound Cleansing - multiple wounds X- 1 5 Wound Imaging (photographs - any number of wounds) []  - 0 Wound Tracing (instead of photographs) X- 1 5 Simple Wound Measurement - one wound []  - 0 Complex Wound Measurement - multiple wounds INTERVENTIONS - Wound Dressings []  - Small Wound Dressing one or multiple wounds 0 X- 1 15 Medium Wound Dressing one or multiple wounds []  - 0 Large Wound Dressing one or multiple wounds []  - 0 Application of Medications - topical []  - 0 Application of Medications - injection INTERVENTIONS - Miscellaneous []  - External ear exam 0 []  - 0 Specimen Collection (cultures, biopsies, blood, body fluids, etc.) []  - 0 Specimen(s) / Culture(s) sent or taken to Lab for analysis []  - 0 Patient Transfer (multiple staff / Civil Service fast streamer / Similar devices) []  - 0 Simple Staple / Suture removal (25 or less) []  - 0 Complex Staple / Suture removal (26 or more) []  - 0 Hypo / Hyperglycemic Management (close monitor of Blood Glucose) []  - 0 Ankle / Brachial Index (ABI) - do not check if billed separately X- 1 5 Vital Signs [name], [name] ([id number]) Has the patient been seen at the hospital within the last three years: Yes Total Score: 70 Level Of Care: New/Established - Level 2 Electronic Signature(s) Signed: 05/05/2018 2:15:42 PM By: Gretta Cool, BSN, RN, CWS, Kim RN, BSN Entered By: Gretta Cool, BSN, RN, CWS, Kim on 05/04/2018 15:15:26 [name] ([id number]) -------------------------------------------------------------------------------- Encounter Discharge Information Details Patient Name: Alexa Lowes P. Date of Service: 05/04/2018 2:30 PM Medical Record Number: [id number] Patient Account Number:  [id number] Date of Birth/Sex: 02-24-1926 (84 y.o. F) Treating RN: Cornell Barman Primary Care Lynzie Cliburn: Lucianne Lei Other Clinician: Referring Rhianna Raulerson: Lucianne Lei Treating Aracelis Ulrey/Extender: Tito Dine in Treatment: 25 Encounter Discharge Information Items Discharge Condition: Stable Ambulatory Status: Ambulatory Discharge Destination: Home Transportation: Private Auto Accompanied By: grandaughtert Schedule Follow-up Appointment: Yes Clinical Summary of Care: Electronic Signature(s) Signed: 05/05/2018 2:15:42 PM By: Gretta Cool, BSN, RN, CWS, Kim RN, BSN Entered By: Gretta Cool, BSN, RN, CWS, Kim on 05/04/2018 15:21:40 Claudio, [name] [name] ([id number]) -------------------------------------------------------------------------------- Lower Extremity Assessment Details Patient Name: Alexa Lowes P. Date of Service: 05/04/2018 2:30 PM Medical Record Number: [id number] Patient Account Number: [id number] Date of Birth/Sex: 28-Apr-1926 (84 y.o. F) Treating RN: Ahmed Prima Primary Care Jonty Morrical: Lucianne Lei Other Clinician: Referring Jazzmyn Filion: Lucianne Lei Treating Florita Nitsch/Extender: Tito Dine in Treatment: 48 Vascular Assessment Pulses: Posterior Tibial Extremity colors, hair growth, and conditions: Extremity Color: [Left:Normal] Hair Growth on Extremity: [Left:No] Temperature of Extremity: [Left:Warm] Electronic Signature(s) Signed: 05/05/2018 2:43:37 PM By: Alric Quan Entered By: Alric Quan on 05/04/2018 15:00:37 [place], XIPJASNK P. ([id number]) -------------------------------------------------------------------------------- Multi Wound Chart Details Patient Name: Alexa Lowes P. Date of Service: 05/04/2018 2:30 PM Medical Record Number: [id number] Patient Account Number: [id number] Date of Birth/Sex: [birth year] (84 y.o. F) Treating RN: Cornell Barman Primary Care Anderson Coppock: Lucianne Lei Other Clinician: Referring Masiya Claassen: Lucianne Lei Treating  Imberly Troxler/Extender: Tito Dine in Treatment: 48 Vital Signs Height(in): 65 Pulse(bpm): 36 Weight(lbs): 131 Blood Pressure(mmHg): 143/56 Body Mass Index(BMI): 22 Temperature(F): 97.5 Respiratory Rate 18 (breaths/min): Photos: [1:No Photos] [N/A:N/A] Wound Location: [1:Left Lower Leg - Anterior] [N/A:N/A] Wounding Event: [1:Trauma] [N/A:N/A] Primary Etiology: [1:Diabetic Wound/Ulcer of the Lower Extremity] [N/A:N/A] Secondary Etiology: [1:Trauma, Other] [N/A:N/A] Comorbid History: [1:Anemia, Hypertension, Type II Diabetes, Osteoarthritis] [N/A:N/A] Date Acquired: [1:05/02/2017] [N/A:N/A] Weeks of Treatment: [1:48] [N/A:N/A] Wound Status: [1:Open] [N/A:N/A]  Measurements L x W x D [1:2.9x0.2x0.1] [N/A:N/A] (cm) Area (cm) : [1:0.456] [N/A:N/A] Volume (cm) : [1:0.046] [N/A:N/A] % Reduction in Area: [1:90.50%] [N/A:N/A] % Reduction in Volume: [1:95.20%] [N/A:N/A] Classification: [1:Grade 2] [N/A:N/A] Exudate Amount: [1:Small] [N/A:N/A] Exudate Type: [1:Serosanguineous] [N/A:N/A] Exudate Color: [1:red, brown] [N/A:N/A] Wound Margin: [1:Flat and Intact] [N/A:N/A] Granulation Amount: [1:Large (67-100%)] [N/A:N/A] Granulation Quality: [1:Red] [N/A:N/A] Necrotic Amount: [1:None Present (0%)] [N/A:N/A] Exposed Structures: [1:Fat Layer (Subcutaneous Tissue) Exposed: Yes Fascia: No Tendon: No Muscle: No Joint: No Bone: No] [N/A:N/A] Epithelialization: [1:Large (67-100%)] [N/A:N/A] Periwound Skin Texture: [1:Scarring: Yes Excoriation: No Induration: No Callus: No] [N/A:N/A] Crepitus: No Rash: No Periwound Skin Moisture: Maceration: Yes N/A N/A Dry/Scaly: No Periwound Skin Color: Hemosiderin Staining: Yes N/A N/A Atrophie Blanche: No Cyanosis: No Ecchymosis: No Erythema: No Mottled: No Pallor: No Rubor: No Temperature: No Abnormality N/A N/A Tenderness on Palpation: Yes N/A N/A Wound Preparation: Ulcer Cleansing: N/A N/A Rinsed/Irrigated with Saline Topical  Anesthetic Applied: Other: lidocaine 4% Treatment Notes Wound #1 (Left, Anterior Lower Leg) 1. Cleansed with: Clean wound with Normal Saline 2. Anesthetic Topical Lidocaine 4% cream to wound bed prior to debridement 4. Dressing Applied: Promogran 5. Secondary Dressing Applied ABD Pad 7. Secured with Recruitment consultant) Signed: 05/04/2018 6:13:14 PM By: Linton Ham MD Entered By: Linton Ham on 05/04/2018 17:21:59 [place], [name] [name] ([id number]) -------------------------------------------------------------------------------- Multi-Disciplinary Care Plan Details Patient Name: Alexa Lowes P. Date of Service: 05/04/2018 2:30 PM Medical Record Number: [id number] Patient Account Number: [id number] Date of Birth/Sex: 03/28/1926 (84 y.o. F) Treating RN: Cornell Barman Primary Care Ahron Hulbert: Lucianne Lei Other Clinician: Referring Keegen Heffern: Lucianne Lei Treating Anneth Brunell/Extender: Tito Dine in Treatment: 61 Active Inactive ` Abuse / Safety / Falls / Self Care Management Nursing Diagnoses: Potential for falls Goals: Patient will remain injury free related to falls Date Initiated: 05/28/2017 Target Resolution Date: 07/23/2017 Goal Status: Active Interventions: Assess fall risk on admission and as needed Notes: ` Orientation to the Wound Care Program Nursing Diagnoses: Knowledge deficit related to the wound healing center program Goals: Patient/caregiver will verbalize understanding of the [place] Program Date Initiated: 05/28/2017 Target Resolution Date: 07/23/2017 Goal Status: Active Interventions: Provide education on orientation to the wound center Notes: ` Wound/Skin Impairment Nursing Diagnoses: Impaired tissue integrity Goals: Ulcer/skin breakdown will have a volume reduction of 30% by week 4 Date Initiated: 05/28/2017 Target Resolution Date: 07/23/2017 Goal Status: Active Ulcer/skin breakdown will have a volume reduction of  50% by week 8 Date Initiated: 05/28/2017 Target Resolution Date: 07/23/2017 JACKLIN, ZWICK ([id number]) Goal Status: Active Ulcer/skin breakdown will have a volume reduction of 80% by week 12 Date Initiated: 05/28/2017 Target Resolution Date: 07/23/2017 Goal Status: Active Ulcer/skin breakdown will heal within 14 weeks Date Initiated: 05/28/2017 Target Resolution Date: 07/23/2017 Goal Status: Active Interventions: Assess patient/caregiver ability to obtain necessary supplies Assess patient/caregiver ability to perform ulcer/skin care regimen upon admission and as needed Assess ulceration(s) every visit Notes: Electronic Signature(s) Signed: 05/05/2018 2:15:42 PM By: Gretta Cool, BSN, RN, CWS, Kim RN, BSN Entered By: Gretta Cool, BSN, RN, CWS, Kim on 05/04/2018 15:13:45 Barthel, [name] [name] ([id number]) -------------------------------------------------------------------------------- Pain Assessment Details Patient Name: Alexa Lowes P. Date of Service: 05/04/2018 2:30 PM Medical Record Number: [id number] Patient Account Number: [id number] Date of Birth/Sex: 1926-06-24 (84 y.o. F) Treating RN: Ahmed Prima Primary Care Corinna Burkman: Lucianne Lei Other Clinician: Referring Amyah Clawson: Lucianne Lei Treating Abanoub Hanken/Extender: Tito Dine in Treatment: 48 Active Problems Location of Pain Severity and Description of Pain Patient Has Paino No Site  Locations Pain Management and Medication Current Pain Management: Electronic Signature(s) Signed: 05/05/2018 2:43:37 PM By: Alric Quan Entered By: Alric Quan on 05/04/2018 14:51:55 [place], [name] [name] ([id number]) -------------------------------------------------------------------------------- Patient/Caregiver Education Details Patient Name: Alexa Lowes P. Date of Service: 05/04/2018 2:30 PM Medical Record Number: [id number] Patient Account Number: [id number] Date of Birth/Gender: [birth year] (84 y.o. F) Treating RN: Cornell Barman Primary Care Physician: Lucianne Lei Other Clinician: Referring Physician: Lucianne Lei Treating Physician/Extender: Tito Dine in Treatment: 26 Education Assessment Education Provided To: Patient Education Topics Provided Wound/Skin Impairment: Handouts: Caring for Your Ulcer, Other: continue wound care as prescribed Methods: Demonstration, Explain/Verbal Responses: State content correctly Electronic Signature(s) Signed: 05/05/2018 2:15:42 PM By: Gretta Cool, BSN, RN, CWS, Kim RN, BSN Entered By: Gretta Cool, BSN, RN, CWS, Kim on 05/04/2018 15:22:08 [name] ([id number]) -------------------------------------------------------------------------------- Wound Assessment Details Patient Name: Alexa Lowes P. Date of Service: 05/04/2018 2:30 PM Medical Record Number: [id number] Patient Account Number: [id number] Date of Birth/Sex: Oct 14, 1926 (84 y.o. F) Treating RN: Ahmed Prima Primary Care Emmons Toth: Lucianne Lei Other Clinician: Referring Jianni Shelden: Lucianne Lei Treating Bev Drennen/Extender: Tito Dine in Treatment: 48 Wound Status Wound Number: 1 Primary Etiology: Diabetic Wound/Ulcer of the Lower Extremity Wound Location: Left Lower Leg - Anterior Secondary Trauma, Other Wounding Event: Trauma Etiology: Date Acquired: 05/02/2017 Wound Status: Open Weeks Of Treatment: 48 Comorbid Anemia, Hypertension, Type II Diabetes, Clustered Wound: No History: Osteoarthritis Photos Photo Uploaded By: Alric Quan on 05/05/2018 08:05:25 Wound Measurements Length: (cm) 2.9 Width: (cm) 0.2 Depth: (cm) 0.1 Area: (cm) 0.456 Volume: (cm) 0.046 % Reduction in Area: 90.5% % Reduction in Volume: 95.2% Epithelialization: Large (67-100%) Tunneling: No Undermining: No Wound Description Classification: Grade 2 Wound Margin: Flat and Intact Exudate Amount: Small Exudate Type: Serosanguineous Exudate Color: red, brown Foul Odor After Cleansing:  No Slough/Fibrino No Wound Bed Granulation Amount: Large (67-100%) Exposed Structure Granulation Quality: Red Fascia Exposed: No Necrotic Amount: None Present (0%) Fat Layer (Subcutaneous Tissue) Exposed: Yes Tendon Exposed: No Muscle Exposed: No Joint Exposed: No Bone Exposed: No Periwound Skin Texture Texture Color No Abnormalities Noted: No No Abnormalities Noted: No Turan, Shanitra P. ([id number]) Callus: No Atrophie Blanche: No Crepitus: No Cyanosis: No Excoriation: No Ecchymosis: No Induration: No Erythema: No Rash: No Hemosiderin Staining: Yes Scarring: Yes Mottled: No Pallor: No Moisture Rubor: No No Abnormalities Noted: No Dry / Scaly: No Temperature / Pain Maceration: Yes Temperature: No Abnormality Tenderness on Palpation: Yes Wound Preparation Ulcer Cleansing: Rinsed/Irrigated with Saline Topical Anesthetic Applied: Other: lidocaine 4%, Treatment Notes Wound #1 (Left, Anterior Lower Leg) 1. Cleansed with: Clean wound with Normal Saline 2. Anesthetic Topical Lidocaine 4% cream to wound bed prior to debridement 4. Dressing Applied: Promogran 5. Secondary Dressing Applied ABD Pad 7. Secured with Recruitment consultant) Signed: 05/05/2018 2:43:37 PM By: Alric Quan Entered By: Alric Quan on 05/04/2018 14:59:50 [place], [name] [name] ([id number]) -------------------------------------------------------------------------------- Vitals Details Patient Name: Alexa Lowes P. Date of Service: 05/04/2018 2:30 PM Medical Record Number: [id number] Patient Account Number: [id number] Date of Birth/Sex: 07/11/1926 (84 y.o. F) Treating RN: Ahmed Prima Primary Care Sebastion Jun: Lucianne Lei Other Clinician: Referring Jackqueline Aquilar: Lucianne Lei Treating Rickelle Sylvestre/Extender: Tito Dine in Treatment: 48 Vital Signs Time Taken: 14:51 Temperature (F): 97.5 Height (in): 65 Pulse (bpm): 81 Weight (lbs): 131 Respiratory Rate  (breaths/min): 18 Body Mass Index (BMI): 21.8 Blood Pressure (mmHg): 143/56 Reference Range: 80 - 120 mg / dl Electronic Signature(s) Signed: 05/05/2018 2:43:37 PM By: Alric Quan Entered By: Alric Quan on 05/04/2018  14:52:49 

## 2018-05-09 ENCOUNTER — Telehealth: Payer: Self-pay | Admitting: Oncology

## 2018-05-09 ENCOUNTER — Encounter: Payer: Self-pay | Admitting: Oncology

## 2018-05-09 NOTE — Telephone Encounter (Signed)
New hematology referral received from Dr. Lucianne Lei for a dx of anemia. Pt has been scheduled to see Dr. Alen Blew on 6/20 at 11am. Letter faxed to the referring office to notify the pt. Letter mailed.

## 2018-05-10 ENCOUNTER — Telehealth: Payer: Self-pay | Admitting: Oncology

## 2018-05-10 NOTE — Telephone Encounter (Signed)
Patient called to reschedule  °

## 2018-05-13 DIAGNOSIS — S91302A Unspecified open wound, left foot, initial encounter: Secondary | ICD-10-CM | POA: Diagnosis not present

## 2018-05-13 DIAGNOSIS — M86172 Other acute osteomyelitis, left ankle and foot: Secondary | ICD-10-CM | POA: Diagnosis not present

## 2018-05-13 DIAGNOSIS — Z89429 Acquired absence of other toe(s), unspecified side: Secondary | ICD-10-CM | POA: Diagnosis not present

## 2018-05-13 DIAGNOSIS — S81802A Unspecified open wound, left lower leg, initial encounter: Secondary | ICD-10-CM | POA: Diagnosis not present

## 2018-05-13 DIAGNOSIS — I739 Peripheral vascular disease, unspecified: Secondary | ICD-10-CM | POA: Diagnosis not present

## 2018-05-17 DIAGNOSIS — N189 Chronic kidney disease, unspecified: Secondary | ICD-10-CM | POA: Diagnosis not present

## 2018-05-17 DIAGNOSIS — I10 Essential (primary) hypertension: Secondary | ICD-10-CM | POA: Diagnosis not present

## 2018-05-17 DIAGNOSIS — E039 Hypothyroidism, unspecified: Secondary | ICD-10-CM | POA: Diagnosis not present

## 2018-05-18 ENCOUNTER — Encounter: Payer: Medicare Other | Admitting: Internal Medicine

## 2018-05-18 DIAGNOSIS — I10 Essential (primary) hypertension: Secondary | ICD-10-CM | POA: Diagnosis not present

## 2018-05-18 DIAGNOSIS — E11621 Type 2 diabetes mellitus with foot ulcer: Secondary | ICD-10-CM | POA: Diagnosis not present

## 2018-05-18 DIAGNOSIS — E1151 Type 2 diabetes mellitus with diabetic peripheral angiopathy without gangrene: Secondary | ICD-10-CM | POA: Diagnosis not present

## 2018-05-18 DIAGNOSIS — I70245 Atherosclerosis of native arteries of left leg with ulceration of other part of foot: Secondary | ICD-10-CM | POA: Diagnosis not present

## 2018-05-18 DIAGNOSIS — L97822 Non-pressure chronic ulcer of other part of left lower leg with fat layer exposed: Secondary | ICD-10-CM | POA: Diagnosis not present

## 2018-05-18 DIAGNOSIS — M1990 Unspecified osteoarthritis, unspecified site: Secondary | ICD-10-CM | POA: Diagnosis not present

## 2018-05-18 DIAGNOSIS — L97824 Non-pressure chronic ulcer of other part of left lower leg with necrosis of bone: Secondary | ICD-10-CM | POA: Diagnosis not present

## 2018-05-18 DIAGNOSIS — I70242 Atherosclerosis of native arteries of left leg with ulceration of calf: Secondary | ICD-10-CM | POA: Diagnosis not present

## 2018-05-18 DIAGNOSIS — L97522 Non-pressure chronic ulcer of other part of left foot with fat layer exposed: Secondary | ICD-10-CM | POA: Diagnosis not present

## 2018-05-19 ENCOUNTER — Encounter: Payer: Medicare Other | Admitting: Oncology

## 2018-05-20 NOTE — Progress Notes (Signed)
[name], [name] ([id number]) Visit Report for 05/18/2018 HPI Details Patient Name: [name], [name] Date of Service: 05/18/2018 1:15 PM Medical Record Number: [id number] Patient Account Number: [id number] Date of Birth/Sex: [birth year] (84 y.o. F) Treating RN: Primary Care Provider: Lucianne Lei Other Clinician: Referring Provider: Lucianne Lei Treating Provider/Extender: Tito Dine in Treatment: 43 History of Present Illness HPI Description: 84 year old patient was seen in the ED about 10 days ago for a history of abrasion to the left lower extremity while she was boarding a bus and scraped the anterior part of her left leg. She has been a diabetic for many years and has been taking treatment regularly. Her past medical history is also significant for anemia arthritis constipation and hypertension and is status post abdominal hysterectomy. She has never been a smoker. after her ER visit she was advised to apply Bactroban ointment to the wound twice a day and continue to monitor her blood glucose levels. her last hemoglobin A1c was done 3 years ago and was 6.6% 06/04/17 patient left anterior shin wound appears to be doing well although it is still somewhat dry despite the treatment with Medihoney. We have been using Kerlex following the Medihoney application and I believe this is just not retaining that much moisture. Nonetheless there is no evidence of infection. 06/11/17 on evaluation today patient appears to be doing a little bit worse in regard to her wound. The entirety of the wound is dry and unfortunately the Medihoney does not seem to be helping this. That is even with the dressing changes that I made last week to try to retain more moisture. She has not tried [employer] as of yet. She was also noncompressible when testing for ABIs. 07/01/2017 -- she had a arterial studies done and was seen by Dr. Lucky Cowboy. her noninvasive studies showed noncompressible vessels on the right but  brisk waveforms and digital pressures of 71 on the right consistent with only mild arterial insufficienc. On the left her ABI was 0.47 and this may be falsely elevated due to calcification. No digital pressures were obtained on the left and this is consistent with severe arterial insufficiency. this critical limb threatening situation on the left, would make wound healing difficult and it represents a serious situation and he has recommended an angiography with possible revascularization. The patient desired to defer the decision to she discuss with the family members. 07/09/17 on evaluation today patient appears to be doing fairly well in regard to her left anterior lower extremity wound. She has been tolerating the dressing changes without complication we have been utilizing Santyl at this time. She tells me that she is having no significant pain compared to what she has had in the past. She has decided after discussing with family that she is going to go forward with the surgery with Dr. dew. This is to restore better blood flow to left lower extremity. With that it does appear that her lower extremity wound is making some progress albeit slow. No fevers, chills, nausea, or vomiting noted at this time. 07/16/17 on evaluation today patient's wound appears to be doing roughly about the same although some of the slough is clearing off. [name] she has her appointment with vascular next week on Thursday the 23rd. With that being said her wound does not appear to be hurting as badly as it has in the past which is good news. No fevers, chills, nausea, or vomiting noted at this time. 07/30/2017 -- she had surgery on 07/22/2017 --indications  being nonhealing ulcer on the left leg with a noninvasive study showing marked reduction in ABI of less than 0.5 and no digital pressure on her left leg. she had a percutaneous transluminal angioplasty of the left common and external iliac arteries. She then had a stent  placed to the left external iliac arteries and to the left common iliac artery. 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 The flow distally was poor and there were multiple areas of high-grade stenosis or occlusion of the distal SFA and the popliteal artery with only a diseased peroneal artery as runoff distally. She has a postop appointment on September 23 08/20/2017 -- she went back to the vascular office for an appointment on 08/12/2017 and bilateral ABIs were done which Heckmann, [name] ([id number]) were notable for moderate right lower extremity arterial disease and unable to obtain left ankle brachial indices due to absent Doppler velocities in the posterior tibial artery and the anterior tibial artery. Monophasic waveforms to the left femoral and popliteal artery. this was worrisome for a failure of stents placed and lower extremity angiogram was recommended. as was done on 08/16/2017 and the stents placed in the left eye Lex system had some mild narrowing at the iliac bifurcation and the leading edge of the stent proximally was poor wall apposition with stenosis in the 50-60% range in the right common iliac artery. There was also occlusion of the common femoral artery and the origin of the SFA and profunda femoris artery. Flow distally was poor but they appeared to be multiple areas of high-grade stenosis or occlusion in the distal SFA and popliteal artery with only a diseased peroneal artery. in another stent was placed and deployed. The femoral occlusion would need to be treated surgically. 08/27/17 on evaluation today patient appears to be doing about the same in regard to her left for extremity wound. Unfortunately secondary to her vascular status this is not a very well healing wound at this time. She has been tolerating the dressing changes she has some discomfort but mainly with cleansing of  the wound not at other times. No fevers, chills, nausea, or vomiting noted at this time. 09/17/2017 -- the patient was recently discharged from the hospital where she was admitted between 09/08/2017 and 09/10/2017, when she had a procedure on her left femoral artery with the endarterectomy and a patch. she will be going back for review this coming week. 09/24/2017 -- was seen on 09/18/2017 in the ER, for bleeding from her wound which was debrided earlier during the day. At the time she was seen there was no active bleeding. A thrombin pad was placed over the area and she was asked to change the outside dressing but come back to the wound center as planned. she was also seen at the [facility] office for a review after her surgery and bilateral lower extremity ABI when compared to the previous ones showed minimal improvement of arterial blood flow. She was set up for a CT angiogram of the abdomen and pelvis and lower extremities to see the degree of peripheral arterial disease 10/28/2017 -- had a x-ray of the left tibia and fibula done on 10/07/2017 -- showed no bony acute abnormality she was reviewed by Dr. Corene Cornea dew on 10/26/2017, and he reviewed her CT angiogram and this demonstrated a widely patent left femoral endarterectomy including the origins of the femoral artery. She also  had moderate distal disease in the popliteal artery, distal SFA and tibial disease. Her peroneal artery is a dominant runoff distally.he felt she had adequate perfusion for healing the mid calf level and he is putting off all surgical intervention for now and is going to see her back in 2-3 months for noninvasive studies. She also has a new wound on the plantar aspect of the left big toe where she pulled a piece of skin and has caused a superficial ulceration 11/19/17 on evaluation today patient appears to be doing decently well at this point in time. It does appear that her Wound VAC has been  approved as ordered by Dr. Con Memos however she has not received that as of yet. Nonetheless the wound bed in general appears to have minimal slough noted at this time in a fairly good amount of granulation although there is to be a noted in the center of the wound. No fevers, chills, nausea, or vomiting noted at this time. 12/02/17 on evaluation today patient appears to be doing about the same in regard to her left anterior shin wound. Unfortunately her left toe wound appears to still be doing about the same as far as the eschar covering. This has softened up a bit to the point that it is now loose and could have selective debridement which would allow for the Santyl to work better. Fortunately she is having no pain. 12/10/17 on evaluation today patient appears to be doing fairly well in regard to her left anterior shin wound and that it at least appears to be stable. With that being said she does have really no significant healing at this point this wound seems to be mainly just maintaining. Subsequently patient also has the left toe wound which does not seem to be progressing. There is still nonviable tissue overlying the surface of the wound and this doesn't even seem to be loosening up very well at all. And patient has been using the Santyl. I do think this may need selective debridement today. Fortunately there does not appear to be any evidence of infection although I am concerned about the possibility that patient's blood flow is not likely sufficient to be able to tolerate appropriate wound healing. This was discussed with patient today as well although obviously I would leave the determination up to vascular surgery. She will be seeing Dr. dew on February 5 for repeat vascular studies and then in office evaluation. 12/17/17 on evaluation today patient appears to be doing about the same in regard to her left shin and left great toe ulcers. She has been performing the dressing changes as  previously recommended over the past week. With that being said unfortunately nothing seems to be changing much in regard to the dimensions of the wound and I do feel again that this may [name], [name] ([id number]) be related to vascular flow. Patient's daughter was present during the evaluation today and we did discuss this as well. I will be detailed in greater detail in the plan. 12/23/17 she is here in follow up evaluation of a left great toe and left anterior leg ulcer. There is noted ischemic changes (urple, dry gangrene, shriveling effect laterally) to the left great toe, although not overtly cold. Contact was made to Dr Ozella Almond office regarding possibility of seeing her in the office today or tomorrow as this is an acute change; they recommended she go to the emergency department for evaluation by Dr Ozella Almond partner Dr Curley Spice. The ER was contacted, spoke  with Tiffany, regarding this. Patient was accompanied by her son. The left anterior shin ulcer is improved in measurement, overtly exposed bone with area of discoloration/necrosis. will follow up after hospitalization/vascular intervention 12/31/17 on evaluation today patient is status post having gone to the ER last week on 12/23/17. 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 This was performed by Dr. Cleda Mccreedy on 12/23/17. Subsequently Dr. Corene Cornea do with [facility] performed percutaneous evaluation and intervention of patient's left lower extremity on 12/27/17. Angioplasty was performed following angiogram of the left peroneal artery and tibial angioplasty was also performed of the left popliteal artery. Angioplasty of the mid and distal superficial femoral arteries was likewise undertaken. Stents were placed in the left above knee popliteal artery following angioplasty and then a second stent placed in the mid- SFA secondary to 50% residual stenosis following the angioplasty. Fortunately patient was  much better post procedure as far as stenosis/occlusions were concerned and she did have definitely much better blood flow which was noted to be evident on evaluation today. 01/07/18 on evaluation today patient appears to be doing well on her left lower extremity in regard to the anterior shin ulcer. She has been tolerating the dressing changes without complication. The good news is nothing appears to be doing worse and in general she does seem to have more granulation tissue. There is still bone exposure. Fortunately she's been tolerating the dressing changes. Patient's a the eyes that Dr. Bunnie Domino office on 01/04/18 revealed a right ABI of 0.69 and a left ABI of 0.81 obviously this is doing very well and significantly improved. 01/12/18; patient with a traumatic wound on the left anterior tibial area. Fairly substantial wound was still open tibia however the amount of exposed bone is apparently a lot better. We have applied for TheraSkins but have not yet received approval. She has been using silver collagen as mentioned everything appears better. She had recent revascularization by [facility] and had a left ABI of 0.81. 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 This is still wrapped and I did not look at this today, apparently we are not following secondary to this being a recent surgical wound. 01/19/18; patient with a traumatic wound on the left anterior tibial area. Only a small amount of open tibia still remains. We applied Theraskin #1. She has an another wound at the right great toe amputation site. This is being followed by her surgeon on a 70 day global. Our nurse inadvertently remove the wet to dry dressing and reported there is exposed bone. We are not following this as of yet 02/02/18; she is back for a deep traumatic wound over the left anterior tibia. We applied the first TheraSkin 2 weeks ago. She returns today in follow-up. The wound is better there is no  exposed bone. She has home health going out they will change the superficial dressings on this in a week there also dealing with a right great toe amputation site, we are not following this as of yet 02/16/18-she is here in follow up evaluation for left lower extremity wound. Theraskin was reapplied. She saw Dr Cleda Mccreedy today who redressed her amputation site 03/02/18; patient is here for follow-up of her left lower extremity wound. We applied TheraSkin 2 weeks ago. She arrived with the wound bed completely granulated. This required debridement. Dimensions are better healthier looking wound. Nevertheless I didn't think a subsequent TheraSkin was necessary  03/09/18; traumatic wound on the left lower extremity with initially exposed tibia. We closed this over with TheraSkin o2. We've now used Hydrofera Blue starting last week.the daughter was present is doing most of dressing changes. She seems to want to discharge home health although she has an appointment next week with regards to her orthopedic issues in her left foot so they're going to keep home health to see if therapy is required. There is apparently an open area on the left foot as well we have not seen this and haven't been following 03/23/18; traumatic wound on the left anterior lower extremity with initially with exposed tibia. This is closed over. The wound continues to contract and is smaller. Surface looks healthy. She follows with orthopedics for an area on her left foot/great toe apparently this is doing better. We haven't looked at this.they no longer have home health care. Her daughter is changing the dressing using Hydrofera Blue 03/30/18; traumatic wound on the left anterior lower extremity with initially exposed tibia. The tibia is closed over. The wound continues to contract and measures smaller. There is only a small part of the inferior part of this that is still open. She follows with orthopedics for an area on her left foot left  great toe. Her daughter is changing the dressing 04/06/18; traumatic wound on the left anterior lower leg which initially had exposed tibia. The tibia was closed over. We've been using Hydrofera Blue. 04/20/18; traumatic wound on the left anterior lower leg which initially had exposed tibia. The second time in a row she comes [name], [name] ([id number]) in with tightly adherent eschar over the surface of this wound over the tibia. Using a #5 curet this is removed. There is still small open areas linearly that remain. Most of the rest of this is epithelialized. 04/30/18; traumatic wound on the left anterior tibial area which on presentation had exposed tibia. There is only 2 tiny open areas here one inferiorly and one superiorly. There is no drainage. No debridement is required 05/18/18; traumatic wound on the left anterior tibia which on presentation had exposed tibia. She arrives today in clinic fully epithelialized. There is scar tissue but no open area.she required revascularization by [facility]. Electronic Signature(s) Signed: 05/18/2018 4:07:49 PM By: Linton Ham MD Entered By: Linton Ham on 05/18/2018 14:20:03 [name], [name] ([id number]) -------------------------------------------------------------------------------- Physical Exam Details Patient Name: Alexa Lowes P. Date of Service: 05/18/2018 1:15 PM Medical Record Number: [id number] Patient Account Number: [id number] Date of Birth/Sex: 11-14-1926 (84 y.o. F) Treating RN: Primary Care Provider: Lucianne Lei Other Clinician: Referring Provider: Lucianne Lei Treating Provider/Extender: [name] Weeks in Treatment: 50 Constitutional Sitting or standing Blood Pressure is within target range for patient.. Pulse regular and within target range for patient.Marland Kitchen Respirations regular, non-labored and within target range.. Temperature is normal and within the target range for the patient.Marland Kitchen appears in no  distress. Notes wound exam; the area on the left anterior tibia is completely epithelialized. Scar tissue present Electronic Signature(s) Signed: 05/18/2018 4:07:49 PM By: Linton Ham MD Entered By: Linton Ham on 05/18/2018 14:29:04 [name], [name] ([id number]) -------------------------------------------------------------------------------- Physician Orders Details Patient Name: Alexa Lowes P. Date of Service: 05/18/2018 1:15 PM Medical Record Number: [id number] Patient Account Number: [id number] Date of Birth/Sex: [birth year] (84 y.o. F) Treating RN: Cornell Barman Primary Care Provider: Lucianne Lei Other Clinician: Referring Provider: Lucianne Lei Treating Provider/Extender: Tito Dine in Treatment: 43 Verbal / Phone Orders: No Diagnosis Coding Discharge From  [facility] o Discharge from [facility] - treatment complete Electronic Signature(s) Signed: 05/18/2018 2:14:45 PM By: Gretta Cool, BSN, RN, CWS, Kim RN, BSN Signed: 05/18/2018 4:07:49 PM By: Linton Ham MD Entered By: Gretta Cool, BSN, RN, CWS, Kim on 05/18/2018 14:14:44 [name], [name] ([id number]) -------------------------------------------------------------------------------- Problem List Details Patient Name: [name], [name] Date of Service: 05/18/2018 1:15 PM Medical Record Number: [id number] Patient Account Number: [id number] Date of Birth/Sex: Sep 07, 1926 (84 y.o. 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 ([id number]) -------------------------------------------------------------------------------- Progress Note Details Patient Name: Alexa Lowes P. Date of Service: 05/18/2018 1:15 PM Medical Record Number: [id number] Patient Account Number: [id number] Date of Birth/Sex: [birth year] (84 y.o. F) Treating RN: Primary Care Provider: Lucianne Lei Other Clinician: Referring Provider: Lucianne Lei Treating Provider/Extender: Tito Dine in Treatment: 50 Subjective History of Present Illness (HPI) 84 year old patient was seen in the ED about 10 days ago for a history of abrasion to the left lower extremity while she was boarding a bus and scraped the anterior part of her left leg. She has been a diabetic for many years and has been taking treatment regularly. Her past medical history is also significant for anemia arthritis constipation and hypertension and is status post abdominal hysterectomy. She has never been a smoker. after her ER visit she was advised to apply Bactroban ointment to the wound twice a day and continue to monitor her blood glucose levels. her last hemoglobin A1c was done 3 years ago and was 6.6% 06/04/17 patient left anterior shin wound appears to be doing well although it is still somewhat dry despite the treatment with Medihoney. We have been using Kerlex following the Medihoney application and I believe this is just not retaining that much moisture. Nonetheless there is no evidence of infection. 06/11/17 on evaluation today patient appears to be doing a  little bit worse in regard to her wound. The entirety of the wound is dry and unfortunately the Medihoney does not seem to be helping this. That is even with the dressing changes that I made last week to try to retain more moisture. She has not tried [employer] as of yet. She was also noncompressible when testing for ABIs. 07/01/2017 -- she had a arterial studies done and was seen by Dr. Lucky Cowboy. her noninvasive studies showed noncompressible vessels on the right but brisk waveforms and digital pressures of 71 on the right consistent with only mild arterial insufficienc. On the left her ABI was 0.47 and this may be falsely elevated due to calcification. No digital pressures were obtained on the left and this is consistent with severe arterial insufficiency. this critical limb threatening situation on the left, would make wound healing difficult and it represents a serious situation and he  has recommended an angiography with possible revascularization. The patient desired to defer the decision to she discuss with the family members. 07/09/17 on evaluation today patient appears to be doing fairly well in regard to her left anterior lower extremity wound. She has been tolerating the dressing changes without complication we have been utilizing Santyl at this time. She tells me that she is having no significant pain compared to what she has had in the past. She has decided after discussing with family that she is going to go forward with the surgery with Dr. dew. This is to restore better blood flow to left lower extremity. With that it does appear that her lower extremity wound is making some progress albeit slow. No fevers, chills, nausea, or vomiting noted at this time. 07/16/17 on evaluation today patient's wound appears to be doing roughly about the same although some of the slough is clearing off. [name] she has her appointment with vascular next week on Thursday the 23rd. With that being said her wound does  not appear to be hurting as badly as it has in the past which is good news. No fevers, chills, nausea, or vomiting noted at this time. 07/30/2017 -- she had surgery on 07/22/2017 --indications being nonhealing ulcer on the left leg with a noninvasive study showing marked reduction in ABI of less than 0.5 and no digital pressure on her left leg. she had a percutaneous transluminal angioplasty of the left common and external iliac arteries. She then had a stent placed to the left external iliac arteries and to the left common iliac artery. 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 The flow distally was poor and there were multiple areas of high-grade stenosis or occlusion of the distal SFA and the popliteal artery with only a diseased peroneal artery as runoff distally. She has a postop appointment on September 23 08/20/2017 -- she went back to the vascular office for an appointment on 08/12/2017 and bilateral ABIs were done which were notable for moderate right lower extremity arterial disease and unable to obtain left ankle brachial indices due to absent Doppler velocities in the posterior tibial artery and the anterior tibial artery. Monophasic waveforms to the left femoral and [name], [name] ([id number]) popliteal artery. this was worrisome for a failure of stents placed and lower extremity angiogram was recommended. as was done on 08/16/2017 and the stents placed in the left eye Lex system had some mild narrowing at the iliac bifurcation and the leading edge of the stent proximally was poor wall apposition with stenosis in the 50-60% range in the right common iliac artery. There was also occlusion of the common femoral artery and the origin of the SFA and profunda femoris artery. Flow distally was poor but they appeared to be multiple areas of high-grade stenosis or occlusion in the distal SFA and  popliteal artery with only a diseased peroneal artery. in another stent was placed and deployed. The femoral occlusion would need to be treated surgically. 08/27/17 on evaluation today patient appears to be doing about the same in regard to her left for extremity wound. Unfortunately secondary to her vascular status this is not a very well healing wound at this time. She has been tolerating the dressing changes she has some discomfort but mainly with cleansing of the wound not at other times. No fevers, chills, nausea, or vomiting noted at this time. 09/17/2017 -- the patient  was recently discharged from the hospital where she was admitted between 09/08/2017 and 09/10/2017, when she had a procedure on her left femoral artery with the endarterectomy and a patch. she will be going back for review this coming week. 09/24/2017 -- was seen on 09/18/2017 in the ER, for bleeding from her wound which was debrided earlier during the day. At the time she was seen there was no active bleeding. A thrombin pad was placed over the area and she was asked to change the outside dressing but come back to the wound center as planned. she was also seen at the [facility] office for a review after her surgery and bilateral lower extremity ABI when compared to the previous ones showed minimal improvement of arterial blood flow. She was set up for a CT angiogram of the abdomen and pelvis and lower extremities to see the degree of peripheral arterial disease 10/28/2017 -- had a x-ray of the left tibia and fibula done on 10/07/2017 -- showed no bony acute abnormality she was reviewed by Dr. Corene Cornea dew on 10/26/2017, and he reviewed her CT angiogram and this demonstrated a widely patent left femoral endarterectomy including the origins of the femoral artery. She also had moderate distal disease in the popliteal artery, distal SFA and tibial disease. Her peroneal artery is a dominant runoff distally.he  felt she had adequate perfusion for healing the mid calf level and he is putting off all surgical intervention for now and is going to see her back in 2-3 months for noninvasive studies. She also has a new wound on the plantar aspect of the left big toe where she pulled a piece of skin and has caused a superficial ulceration 11/19/17 on evaluation today patient appears to be doing decently well at this point in time. It does appear that her Wound VAC has been approved as ordered by Dr. Con Memos however she has not received that as of yet. Nonetheless the wound bed in general appears to have minimal slough noted at this time in a fairly good amount of granulation although there is to be a noted in the center of the wound. No fevers, chills, nausea, or vomiting noted at this time. 12/02/17 on evaluation today patient appears to be doing about the same in regard to her left anterior shin wound. Unfortunately her left toe wound appears to still be doing about the same as far as the eschar covering. This has softened up a bit to the point that it is now loose and could have selective debridement which would allow for the Santyl to work better. Fortunately she is having no pain. 12/10/17 on evaluation today patient appears to be doing fairly well in regard to her left anterior shin wound and that it at least appears to be stable. With that being said she does have really no significant healing at this point this wound seems to be mainly just maintaining. Subsequently patient also has the left toe wound which does not seem to be progressing. There is still nonviable tissue overlying the surface of the wound and this doesn't even seem to be loosening up very well at all. And patient has been using the Santyl. I do think this may need selective debridement today. Fortunately there does not appear to be any evidence of infection although I am concerned about the possibility that patient's blood flow is not likely  sufficient to be able to tolerate appropriate wound healing. This was discussed with patient today as  well although obviously I would leave the determination up to vascular surgery. She will be seeing Dr. dew on February 5 for repeat vascular studies and then in office evaluation. 12/17/17 on evaluation today patient appears to be doing about the same in regard to her left shin and left great toe ulcers. She has been performing the dressing changes as previously recommended over the past week. With that being said unfortunately nothing seems to be changing much in regard to the dimensions of the wound and I do feel again that this may be related to vascular flow. Patient's daughter was present during the evaluation today and we did discuss this as well. I will be detailed in greater detail in the plan. [name], [name] ([id number]) 12/23/17 she is here in follow up evaluation of a left great toe and left anterior leg ulcer. There is noted ischemic changes (urple, dry gangrene, shriveling effect laterally) to the left great toe, although not overtly cold. Contact was made to Dr Ozella Almond office regarding possibility of seeing her in the office today or tomorrow as this is an acute change; they recommended she go to the emergency department for evaluation by Dr Ozella Almond partner Dr Curley Spice. The ER was contacted, spoke with Tiffany, regarding this. Patient was accompanied by her son. The left anterior shin ulcer is improved in measurement, overtly exposed bone with area of discoloration/necrosis. will follow up after hospitalization/vascular intervention 12/31/17 on evaluation today patient is status post having gone to the ER last week on 12/23/17. 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 This was performed by Dr. Cleda Mccreedy on 12/23/17. Subsequently Dr. Corene Cornea do with [facility] performed percutaneous evaluation and intervention of patient's left lower extremity on  12/27/17. Angioplasty was performed following angiogram of the left peroneal artery and tibial angioplasty was also performed of the left popliteal artery. Angioplasty of the mid and distal superficial femoral arteries was likewise undertaken. Stents were placed in the left above knee popliteal artery following angioplasty and then a second stent placed in the mid- SFA secondary to 50% residual stenosis following the angioplasty. Fortunately patient was much better post procedure as far as stenosis/occlusions were concerned and she did have definitely much better blood flow which was noted to be evident on evaluation today. 01/07/18 on evaluation today patient appears to be doing well on her left lower extremity in regard to the anterior shin ulcer. She has been tolerating the dressing changes without complication. The good news is nothing appears to be doing worse and in general she does seem to have more granulation tissue. There is still bone exposure. Fortunately she's been tolerating the dressing changes. Patient's a the eyes that Dr. Bunnie Domino office on 01/04/18 revealed a right ABI of 0.69 and a left ABI of 0.81 obviously this is doing very well and significantly improved. 01/12/18; patient with a traumatic wound on the left anterior tibial area. Fairly substantial wound was still open tibia however the amount of exposed bone is apparently a lot better. We have applied for TheraSkins but have not yet received approval. She has been using silver collagen as mentioned everything appears better. She had recent revascularization by [facility] and had a left ABI of 0.81. 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 This is still wrapped and I did not look at this today, apparently we are not following secondary to this being a recent surgical wound. 01/19/18; patient  with a traumatic wound on the left anterior tibial area. Only a small amount of open tibia still remains.  We applied Theraskin #1. She has an another wound at the right great toe amputation site. This is being followed by her surgeon on a 40 day global. Our nurse inadvertently remove the wet to dry dressing and reported there is exposed bone. We are not following this as of yet 02/02/18; she is back for a deep traumatic wound over the left anterior tibia. We applied the first TheraSkin 2 weeks ago. She returns today in follow-up. The wound is better there is no exposed bone. She has home health going out they will change the superficial dressings on this in a week there also dealing with a right great toe amputation site, we are not following this as of yet 02/16/18-she is here in follow up evaluation for left lower extremity wound. Theraskin was reapplied. She saw Dr Cleda Mccreedy today who redressed her amputation site 03/02/18; patient is here for follow-up of her left lower extremity wound. We applied TheraSkin 2 weeks ago. She arrived with the wound bed completely granulated. This required debridement. Dimensions are better healthier looking wound. Nevertheless I didn't think a subsequent TheraSkin was necessary 03/09/18; traumatic wound on the left lower extremity with initially exposed tibia. We closed this over with TheraSkin o2. We've now used Hydrofera Blue starting last week.the daughter was present is doing most of dressing changes. She seems to want to discharge home health although she has an appointment next week with regards to her orthopedic issues in her left foot so they're going to keep home health to see if therapy is required. There is apparently an open area on the left foot as well we have not seen this and haven't been following 03/23/18; traumatic wound on the left anterior lower extremity with initially with exposed tibia. This is closed over. The wound continues to contract and is smaller. Surface looks healthy. She follows with orthopedics for an area on her left foot/great  toe apparently this is doing better. We haven't looked at this.they no longer have home health care. Her daughter is changing the dressing using Hydrofera Blue 03/30/18; traumatic wound on the left anterior lower extremity with initially exposed tibia. The tibia is closed over. The wound continues to contract and measures smaller. There is only a small part of the inferior part of this that is still open. She follows with orthopedics for an area on her left foot left great toe. Her daughter is changing the dressing 04/06/18; traumatic wound on the left anterior lower leg which initially had exposed tibia. The tibia was closed over. We've been using Hydrofera Blue. 04/20/18; traumatic wound on the left anterior lower leg which initially had exposed tibia. The second time in a row she comes in with tightly adherent eschar over the surface of this wound over the tibia. Using a #5 curet this is removed. There is still small open areas linearly that remain. Most of the rest of this is epithelialized. CHRISTAIN, MCRANEY ([id number]) 04/30/18; traumatic wound on the left anterior tibial area which on presentation had exposed tibia. There is only 2 tiny open areas here one inferiorly and one superiorly. There is no drainage. No debridement is required 05/18/18; traumatic wound on the left anterior tibia which on presentation had exposed tibia. She arrives today in clinic fully epithelialized. There is scar tissue but no open area.she required revascularization by [facility]. Objective Constitutional Sitting or standing  Blood Pressure is within target range for patient.. Pulse regular and within target range for patient.Marland Kitchen Respirations regular, non-labored and within target range.. Temperature is normal and within the target range for the patient.Marland Kitchen appears in no distress. Vitals Time Taken: 1:20 PM, Height: 65 in, Weight: 131 lbs, BMI: 21.8, Temperature: 97.5 F, Pulse: 70 bpm,  Respiratory Rate: 18 breaths/min, Blood Pressure: 133/62 mmHg. General Notes: wound exam; the area on the left anterior tibia is completely epithelialized. Scar tissue present Integumentary (Hair, Skin) Wound #1 status is Healed - Epithelialized. Original cause of wound was Trauma. The wound is located on the Left,Anterior Lower Leg. The wound measures 0cm length x 0cm width x 0cm depth; 0cm^2 area and 0cm^3 volume. There is Fat Layer (Subcutaneous Tissue) Exposed exposed. There is no tunneling or undermining noted. There is a none present amount of drainage noted. The wound margin is flat and intact. There is no granulation within the wound bed. There is no necrotic tissue within the wound bed. The periwound skin appearance exhibited: Scarring, Maceration, Hemosiderin Staining. The periwound skin appearance did not exhibit: Callus, Crepitus, Excoriation, Induration, Rash, Dry/Scaly, Atrophie Blanche, Cyanosis, Ecchymosis, Mottled, Pallor, Rubor, Erythema. Periwound temperature was noted as No Abnormality. The periwound has tenderness on palpation. 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 #3 no secondary prevention here. Electronic Signature(s) Signed: 05/18/2018 4:07:49 PM By: Linton Ham MD Entered By: Linton Ham on 05/18/2018  14:31:10 Kinkead, [name] ([id number]) -------------------------------------------------------------------------------- SuperBill Details Patient Name: Alexa Lowes P. Date of Service: 05/18/2018 Medical Record Number: [id number] Patient Account Number: [id number] Date of Birth/Sex: [birth year] (84 y.o. 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

## 2018-05-21 NOTE — Progress Notes (Signed)
[name], [name] ([id number]) Visit Report for 05/18/2018 Arrival Information Details Patient Name: [name], [name] Date of Service: 05/18/2018 1:15 PM Medical Record Number: [id number] Patient Account Number: [id number] Date of Birth/Sex: 29-Oct-1926 (84 y.o. F) Treating RN: Ahmed Prima Primary Care Natalee Tomkiewicz: Lucianne Lei Other Clinician: Referring Ron Beske: Lucianne Lei Treating Stephano Arrants/Extender: Tito Dine in Treatment: 105 Visit Information History Since Last Visit All ordered tests and consults were completed: No Patient Arrived: Cane Added or deleted any medications: No Arrival Time: 13:14 Any new allergies or adverse reactions: No Accompanied By: grandaughter Had a fall or experienced change in No Transfer Assistance: EasyPivot Patient activities of daily living that may affect Lift risk of falls: Patient Identification Verified: Yes Signs or symptoms of abuse/neglect since last visito No Secondary Verification Process Yes Hospitalized since last visit: No Completed: Implantable device outside of the clinic excluding No Patient Requires Transmission-Based No cellular tissue based products placed in the center Precautions: since last visit: Patient Has Alerts: Yes Has Dressing in Place as Prescribed: Yes Patient Alerts: 01/04/2018 ABI @ Pain Present Now: No AVVS (R) 0.69 (L) 0.81 Electronic Signature(s) Signed: 05/19/2018 4:29:22 PM By: Alric Quan Entered By: Alric Quan on 05/18/2018 13:15:00 [name], [name] ([id number]) -------------------------------------------------------------------------------- Clinic Level of Care Assessment Details Patient Name: Alexa Lowes P. Date of Service: 05/18/2018 1:15 PM Medical Record Number: [id number] Patient Account Number: [id number] Date of Birth/Sex: 09-Apr-1926 (84 y.o. F) Treating RN: Cornell Barman Primary Care Lesbia Ottaway: Lucianne Lei Other Clinician: Referring Baylen Buckner: Lucianne Lei Treating Valetta Mulroy/Extender: Tito Dine in Treatment: 50 Clinic Level of Care Assessment Items TOOL 4 Quantity Score []  - Use when only an EandM is performed on FOLLOW-UP visit 0 ASSESSMENTS - Nursing Assessment / Reassessment []  - Reassessment of Co-morbidities (includes updates in patient status) 0 X- 1 5 Reassessment of Adherence to Treatment Plan ASSESSMENTS - Wound and Skin Assessment / Reassessment X - Simple Wound Assessment / Reassessment - one wound 1 5 []  - 0 Complex Wound Assessment / Reassessment - multiple wounds []  - 0 Dermatologic / Skin Assessment (not related to wound area) ASSESSMENTS - Focused Assessment []  - Circumferential Edema Measurements - multi extremities 0 []  - 0 Nutritional Assessment / Counseling / Intervention []  - 0 Lower Extremity Assessment (monofilament, tuning fork, pulses) []  - 0 Peripheral Arterial Disease Assessment (using hand held doppler) ASSESSMENTS - Ostomy and/or Continence Assessment and Care []  - Incontinence Assessment and Management 0 []  - 0 Ostomy Care Assessment and Management (repouching, etc.) PROCESS - Coordination of Care X - Simple Patient / Family Education for ongoing care 1 15 []  - 0 Complex (extensive) Patient / Family Education for ongoing care []  - 0 Staff obtains Programmer, systems, Records, Test Results / Process Orders []  - 0 Staff telephones HHA, Nursing Homes / Clarify orders / etc []  - 0 Routine Transfer to another Facility (non-emergent condition) []  - 0 Routine Hospital Admission (non-emergent condition) []  - 0 New Admissions / Biomedical engineer / Ordering NPWT, Apligraf, etc. []  - 0 Emergency Hospital Admission (emergent condition) X- 1 10 Simple Discharge Coordination Marchesi, [name] ([id number]) []  - 0 Complex (extensive) Discharge Coordination PROCESS - Special Needs []  - Pediatric / Minor Patient Management 0 []  - 0 Isolation Patient Management []  - 0 Hearing / Language  / Visual special needs []  - 0 Assessment of Community assistance (transportation, D/C planning, etc.) []  - 0 Additional assistance / Altered mentation []  - 0 Support Surface(s) Assessment (bed, cushion, seat, etc.) INTERVENTIONS -  Wound Cleansing / Measurement X - Simple Wound Cleansing - one wound 1 5 []  - 0 Complex Wound Cleansing - multiple wounds X- 1 5 Wound Imaging (photographs - any number of wounds) []  - 0 Wound Tracing (instead of photographs) X- 1 5 Simple Wound Measurement - one wound []  - 0 Complex Wound Measurement - multiple wounds INTERVENTIONS - Wound Dressings []  - Small Wound Dressing one or multiple wounds 0 []  - 0 Medium Wound Dressing one or multiple wounds []  - 0 Large Wound Dressing one or multiple wounds []  - 0 Application of Medications - topical []  - 0 Application of Medications - injection INTERVENTIONS - Miscellaneous []  - External ear exam 0 []  - 0 Specimen Collection (cultures, biopsies, blood, body fluids, etc.) []  - 0 Specimen(s) / Culture(s) sent or taken to Lab for analysis []  - 0 Patient Transfer (multiple staff / Civil Service fast streamer / Similar devices) []  - 0 Simple Staple / Suture removal (25 or less) []  - 0 Complex Staple / Suture removal (26 or more) []  - 0 Hypo / Hyperglycemic Management (close monitor of Blood Glucose) []  - 0 Ankle / Brachial Index (ABI) - do not check if billed separately X- 1 5 Vital Signs Melnyk, [name] ([id number]) Has the patient been seen at the hospital within the last three years: Yes Total Score: 55 Level Of Care: New/Established - Level 2 Electronic Signature(s) Signed: 05/18/2018 4:19:21 PM By: Gretta Cool, BSN, RN, CWS, Kim RN, BSN Entered By: Gretta Cool, BSN, RN, CWS, Kim on 05/18/2018 14:15:03 Dolby, [name] ([id number]) -------------------------------------------------------------------------------- Encounter Discharge Information Details Patient Name: Alexa Lowes P. Date of Service: 05/18/2018  1:15 PM Medical Record Number: [id number] Patient Account Number: [id number] Date of Birth/Sex: [birth year] (84 y.o. F) Treating RN: Cornell Barman Primary Care [name] [name]: Lucianne Lei Other Clinician: Referring Yusif Gnau: Lucianne Lei Treating Jarod Bozzo/Extender: Tito Dine in Treatment: 45 Encounter Discharge Information Items Discharge Condition: Stable Ambulatory Status: Cane Discharge Destination: Home Transportation: Private Auto Accompanied By: granddaughter Schedule Follow-up Appointment: Yes Clinical Summary of Care: Electronic Signature(s) Signed: 05/18/2018 2:15:43 PM By: Gretta Cool, BSN, RN, CWS, Kim RN, BSN Entered By: Gretta Cool, BSN, RN, CWS, Kim on 05/18/2018 14:15:42 Tays, [name] ([id number]) -------------------------------------------------------------------------------- Lower Extremity Assessment Details Patient Name: Alexa Lowes P. Date of Service: 05/18/2018 1:15 PM Medical Record Number: [id number] Patient Account Number: [id number] Date of Birth/Sex: 1926-02-04 (84 y.o. F) Treating RN: Ahmed Prima Primary Care Javelle Donigan: Lucianne Lei Other Clinician: Referring Davyon Fisch: Lucianne Lei Treating Kaivon Livesey/Extender: Tito Dine in Treatment: 50 Vascular Assessment Pulses: Posterior Tibial Extremity colors, hair growth, and conditions: Extremity Color: [Left:Normal] Hair Growth on Extremity: [Left:No] Temperature of Extremity: [Left:Warm] Electronic Signature(s) Signed: 05/19/2018 4:29:22 PM By: Alric Quan Entered By: Alric Quan on 05/18/2018 13:25:42 Crites, [name] ([id number]) -------------------------------------------------------------------------------- Multi Wound Chart Details Patient Name: Alexa Lowes P. Date of Service: 05/18/2018 1:15 PM Medical Record Number: [id number] Patient Account Number: [id number] Date of Birth/Sex: 1926-01-16 (84 y.o. F) Treating RN: Cornell Barman Primary Care Tamlyn Sides: Lucianne Lei  Other Clinician: Referring Firman Petrow: Lucianne Lei Treating Mallori Araque/Extender: Tito Dine in Treatment: 50 Vital Signs Height(in): 65 Pulse(bpm): 70 Weight(lbs): 131 Blood Pressure(mmHg): 133/62 Body Mass Index(BMI): 22 Temperature(F): 97.5 Respiratory Rate 18 (breaths/min): Photos: [1:No Photos] [N/A:N/A] Wound Location: [1:Left Lower Leg - Anterior] [N/A:N/A] Wounding Event: [1:Trauma] [N/A:N/A] Primary Etiology: [1:Diabetic Wound/Ulcer of the Lower Extremity] [N/A:N/A] Secondary Etiology: [1:Trauma, Other] [N/A:N/A] Comorbid History: [1:Anemia, Hypertension, Type II Diabetes, Osteoarthritis] [N/A:N/A] Date Acquired: [1:05/02/2017] [N/A:N/A] Weeks of Treatment: [1:50] [N/A:N/A]  Wound Status: [1:Healed - Epithelialized] [N/A:N/A] Measurements L x W x D [1:0x0x0] [N/A:N/A] (cm) Area (cm) : [1:0] [N/A:N/A] Volume (cm) : [1:0] [N/A:N/A] % Reduction in Area: [1:100.00%] [N/A:N/A] % Reduction in Volume: [1:100.00%] [N/A:N/A] Classification: [1:Grade 2] [N/A:N/A] Exudate Amount: [1:None Present] [N/A:N/A] Wound Margin: [1:Flat and Intact] [N/A:N/A] Granulation Amount: [1:None Present (0%)] [N/A:N/A] Necrotic Amount: [1:None Present (0%)] [N/A:N/A] Exposed Structures: [1:Fat Layer (Subcutaneous Tissue) Exposed: Yes Fascia: No Tendon: No Muscle: No Joint: No Bone: No] [N/A:N/A] Epithelialization: [1:Large (67-100%)] [N/A:N/A] Periwound Skin Texture: [1:Scarring: Yes Excoriation: No Induration: No Callus: No Crepitus: No Rash: No] [N/A:N/A] Periwound Skin Moisture: [N/A:N/A] Maceration: Yes Dry/Scaly: No Periwound Skin Color: Hemosiderin Staining: Yes N/A N/A Atrophie Blanche: No Cyanosis: No Ecchymosis: No Erythema: No Mottled: No Pallor: No Rubor: No Temperature: No Abnormality N/A N/A Tenderness on Palpation: Yes N/A N/A Wound Preparation: Ulcer Cleansing: N/A N/A Rinsed/Irrigated with Saline Topical Anesthetic Applied: None Treatment  Notes Electronic Signature(s) Signed: 05/18/2018 4:07:49 PM By: Linton Ham MD Previous Signature: 05/18/2018 2:14:11 PM Version By: Gretta Cool, BSN, RN, CWS, Kim RN, BSN Entered By: Linton Ham on 05/18/2018 14:18:21 [name], [name] ([id number]) -------------------------------------------------------------------------------- Multi-Disciplinary Care Plan Details Patient Name: Alexa Lowes P. Date of Service: 05/18/2018 1:15 PM Medical Record Number: [id number] Patient Account Number: [id number] Date of Birth/Sex: [birth year] (84 y.o. F) Treating RN: Cornell Barman Primary Care Clanton Emanuelson: Lucianne Lei Other Clinician: Referring Kaidan Spengler: Lucianne Lei Treating Samara Stankowski/Extender: Tito Dine in Treatment: 50 Active Inactive Electronic Signature(s) Signed: 05/18/2018 2:14:01 PM By: Gretta Cool, BSN, RN, CWS, Kim RN, BSN Entered By: Gretta Cool, BSN, RN, CWS, Kim on 05/18/2018 14:14:01 [name], [name] ([id number]) -------------------------------------------------------------------------------- Pain Assessment Details Patient Name: Alexa Lowes P. Date of Service: 05/18/2018 1:15 PM Medical Record Number: [id number] Patient Account Number: [id number] Date of Birth/Sex: Oct 14, 1926 (84 y.o. F) Treating RN: Ahmed Prima Primary Care Lakysha Kossman: Lucianne Lei Other Clinician: Referring Haya Hemler: Lucianne Lei Treating Hartlee Amedee/Extender: Tito Dine in Treatment: 50 Active Problems Location of Pain Severity and Description of Pain Patient Has Paino No Site Locations Pain Management and Medication Current Pain Management: Electronic Signature(s) Signed: 05/19/2018 4:29:22 PM By: Alric Quan Entered By: Alric Quan on 05/18/2018 13:15:12 [name], [name] ([id number]) -------------------------------------------------------------------------------- Patient/Caregiver Education Details Patient Name: Alexa Lowes P. Date of Service: 05/18/2018 1:15  PM Medical Record Number: [id number] Patient Account Number: [id number] Date of Birth/Gender: 1926-01-09 (84 y.o. F) Treating RN: Cornell Barman Primary Care Physician: Lucianne Lei Other Clinician: Referring Physician: Lucianne Lei Treating Physician/Extender: Tito Dine in Treatment: 82 Education Assessment Education Provided To: Patient Education Topics Provided Wound/Skin Impairment: Handouts: Other: moisturize daily Methods: Explain/Verbal Responses: State content correctly Electronic Signature(s) Signed: 05/18/2018 4:19:21 PM By: Gretta Cool, BSN, RN, CWS, Kim RN, BSN Entered By: Gretta Cool, BSN, RN, CWS, Kim on 05/18/2018 14:16:12 Hellinger, [name] ([id number]) -------------------------------------------------------------------------------- Wound Assessment Details Patient Name: Alexa Lowes P. Date of Service: 05/18/2018 1:15 PM Medical Record Number: [id number] Patient Account Number: [id number] Date of Birth/Sex: 18-Oct-1926 (84 y.o. F) Treating RN: Ahmed Prima Primary Care Vanden Fawaz: Lucianne Lei Other Clinician: Referring [name]: Lucianne Lei Treating Jin Shockley/Extender: [name] Weeks in Treatment: 50 Wound Status Wound Number: 1 Primary Etiology: Diabetic Wound/Ulcer of the Lower Extremity Wound Location: Left Lower Leg - Anterior Secondary Trauma, Other Wounding Event: Trauma Etiology: Date Acquired: 05/02/2017 Wound Status: Healed - Epithelialized Weeks Of Treatment: 50 Comorbid Anemia, Hypertension, Type II Diabetes, Clustered Wound: No History: Osteoarthritis Photos Photo Uploaded By: Alric Quan on 05/19/2018 16:38:03 Wound Measurements Length: (cm) 0 %  Re Width: (cm) 0 % Re Depth: (cm) 0 Epit Area: (cm) 0 Tun Volume: (cm) 0 Und duction in Area: 100% duction in Volume: 100% helialization: Large (67-100%) neling: No ermining: No Wound Description Classification: Grade 2 Wound Margin: Flat and Intact Exudate Amount: None  Present Foul Odor After Cleansing: No Slough/Fibrino No Wound Bed Granulation Amount: None Present (0%) Exposed Structure Necrotic Amount: None Present (0%) Fascia Exposed: No Fat Layer (Subcutaneous Tissue) Exposed: Yes Tendon Exposed: No Muscle Exposed: No Joint Exposed: No Bone Exposed: No Periwound Skin Texture Texture Color No Abnormalities Noted: No No Abnormalities Noted: No Callus: No Atrophie Blanche: No Crepitus: No Cyanosis: No Waner, Lizmarie P. ([id number]) Excoriation: No Ecchymosis: No Induration: No Erythema: No Rash: No Hemosiderin Staining: Yes Scarring: Yes Mottled: No Pallor: No Moisture Rubor: No No Abnormalities Noted: No Dry / Scaly: No Temperature / Pain Maceration: Yes Temperature: No Abnormality Tenderness on Palpation: Yes Wound Preparation Ulcer Cleansing: Rinsed/Irrigated with Saline Topical Anesthetic Applied: None Electronic Signature(s) Signed: 05/19/2018 4:29:22 PM By: Alric Quan Entered By: Alric Quan on 05/18/2018 13:25:21 Gabrielle, TELMRAJH P. ([id number]) -------------------------------------------------------------------------------- Vitals Details Patient Name: Alexa Lowes P. Date of Service: 05/18/2018 1:15 PM Medical Record Number: [id number] Patient Account Number: [id number] Date of Birth/Sex: 13-Nov-1926 (84 y.o. F) Treating RN: Ahmed Prima Primary Care Chanin Frumkin: Lucianne Lei Other Clinician: Referring Myrl Lazarus: Lucianne Lei Treating Simisola Sandles/Extender: Tito Dine in Treatment: 50 Vital Signs Time Taken: 13:20 Temperature (F): 97.5 Height (in): 65 Pulse (bpm): 70 Weight (lbs): 131 Respiratory Rate (breaths/min): 18 Body Mass Index (BMI): 21.8 Blood Pressure (mmHg): 133/62 Reference Range: 80 - 120 mg / dl Electronic Signature(s) Signed: 05/19/2018 4:29:22 PM By: Alric Quan Entered By: Alric Quan on 05/18/2018 13:24:52

## 2018-05-25 ENCOUNTER — Inpatient Hospital Stay: Payer: Medicare Other | Attending: Oncology | Admitting: Oncology

## 2018-05-25 ENCOUNTER — Inpatient Hospital Stay: Payer: Medicare Other

## 2018-05-25 ENCOUNTER — Telehealth: Payer: Self-pay | Admitting: Oncology

## 2018-05-25 VITALS — BP 152/69 | HR 73 | Temp 98.0°F | Resp 17 | Ht 66.5 in | Wt 118.4 lb

## 2018-05-25 DIAGNOSIS — D649 Anemia, unspecified: Secondary | ICD-10-CM | POA: Insufficient documentation

## 2018-05-25 DIAGNOSIS — Z7982 Long term (current) use of aspirin: Secondary | ICD-10-CM | POA: Insufficient documentation

## 2018-05-25 DIAGNOSIS — I10 Essential (primary) hypertension: Secondary | ICD-10-CM | POA: Insufficient documentation

## 2018-05-25 DIAGNOSIS — Z79899 Other long term (current) drug therapy: Secondary | ICD-10-CM | POA: Diagnosis not present

## 2018-05-25 DIAGNOSIS — Z7984 Long term (current) use of oral hypoglycemic drugs: Secondary | ICD-10-CM | POA: Diagnosis not present

## 2018-05-25 DIAGNOSIS — E1151 Type 2 diabetes mellitus with diabetic peripheral angiopathy without gangrene: Secondary | ICD-10-CM | POA: Insufficient documentation

## 2018-05-25 DIAGNOSIS — N289 Disorder of kidney and ureter, unspecified: Secondary | ICD-10-CM | POA: Insufficient documentation

## 2018-05-25 LAB — CMP (CANCER CENTER ONLY)
ALT: 33 U/L (ref 0–44)
AST: 59 U/L — AB (ref 15–41)
Albumin: 3.1 g/dL — ABNORMAL LOW (ref 3.5–5.0)
Alkaline Phosphatase: 63 U/L (ref 38–126)
Anion gap: 11 (ref 5–15)
BILIRUBIN TOTAL: 0.3 mg/dL (ref 0.3–1.2)
BUN: 33 mg/dL — AB (ref 8–23)
CO2: 24 mmol/L (ref 22–32)
CREATININE: 2.24 mg/dL — AB (ref 0.44–1.00)
Calcium: 8.5 mg/dL — ABNORMAL LOW (ref 8.9–10.3)
Chloride: 105 mmol/L (ref 98–111)
GFR, EST NON AFRICAN AMERICAN: 18 mL/min — AB (ref >60)
GFR, Est AFR Am: 21 mL/min — ABNORMAL LOW (ref >60)
Glucose, Bld: 120 mg/dL — ABNORMAL HIGH (ref 70–99)
POTASSIUM: 3.9 mmol/L (ref 3.5–5.1)
Sodium: 140 mmol/L (ref 135–145)
TOTAL PROTEIN: 8 g/dL (ref 6.5–8.1)

## 2018-05-25 LAB — FERRITIN: Ferritin: 159 ng/mL (ref 11–307)

## 2018-05-25 LAB — CBC WITH DIFFERENTIAL (CANCER CENTER ONLY)
BASOS ABS: 0 10*3/uL (ref 0.0–0.1)
Basophils Relative: 0 %
Eosinophils Absolute: 0.1 10*3/uL (ref 0.0–0.5)
Eosinophils Relative: 1 %
HEMATOCRIT: 27.1 % — AB (ref 34.8–46.6)
Hemoglobin: 8.8 g/dL — ABNORMAL LOW (ref 11.6–15.9)
LYMPHS ABS: 2.4 10*3/uL (ref 0.9–3.3)
LYMPHS PCT: 44 %
MCH: 26 pg (ref 25.1–34.0)
MCHC: 32.5 g/dL (ref 31.5–36.0)
MCV: 80.2 fL (ref 79.5–101.0)
MONOS PCT: 11 %
Monocytes Absolute: 0.6 10*3/uL (ref 0.1–0.9)
NEUTROS ABS: 2.5 10*3/uL (ref 1.5–6.5)
Neutrophils Relative %: 44 %
Platelet Count: 236 10*3/uL (ref 145–400)
RBC: 3.38 MIL/uL — ABNORMAL LOW (ref 3.70–5.45)
RDW: 19.7 % — AB (ref 11.2–14.5)
WBC Count: 5.6 10*3/uL (ref 3.9–10.3)

## 2018-05-25 LAB — IRON AND TIBC
IRON: 50 ug/dL (ref 41–142)
Saturation Ratios: 20 % — ABNORMAL LOW (ref 21–57)
TIBC: 255 ug/dL (ref 236–444)
UIBC: 205 ug/dL

## 2018-05-25 LAB — VITAMIN B12: Vitamin B-12: 442 pg/mL (ref 180–914)

## 2018-05-25 NOTE — Telephone Encounter (Signed)
Appointments scheduled AVS/Calendar printed per 6/26 los °

## 2018-05-25 NOTE — Progress Notes (Signed)
Reason for the request: Anemia  HPI: I was asked by Dr. Criss Rosales  to evaluate [name] for anemia.  She is a pleasant 84 year old woman with history of diabetes, hyperlipidemia and peripheral vascular disease.  She was noted to have anemia during her hospitalization in January 2019 where she was found to have cellulitis and necrotic toe and required a left toe amputation.  At that time her hemoglobin was 7.1 with an MCV of 72 and elevated RDW.  Her platelet count was 448.  She received packed red cell transfusion at that time with a hemoglobin improving up to 8.9.  A recent CBC obtained by Dr. Criss Rosales and no CBC obtained in June 2019 showed a hemoglobin of 8.8, white cell count of 4.0, MCV 79.9 and RDW of 20.2.  She had normal platelets and differential.  Her creatinine was 1.66 with creatinine clearance of around 31 cc/min.  Based these findings I was asked to evaluate her for diagnosis of anemia.  Clinically, she reports no symptoms related to these findings.  She denies any shortness of breath or difficulty breathing.  She denies any hematochezia or melena.  She does ambulate with the help of a cane without any falls or syncope.  She does report unsteadiness because of her toe amputation.  She does not report any headaches, blurry vision, syncope or seizures. Does not report any fevers, chills or sweats.  Does not report any cough, wheezing or hemoptysis.  Does not report any chest pain, palpitation, orthopnea or leg edema.  Does not report any nausea, vomiting or abdominal pain.  Does not report any constipation or diarrhea.  Does not report any skeletal complaints.    Does not report frequency, urgency or hematuria.  Does not report any skin rashes or lesions. Does not report any heat or cold intolerance.  Does not report any lymphadenopathy or petechiae.  Does not report any anxiety or depression.  Remaining review of systems is negative.    Past Medical History:  Diagnosis Date  . Anemia    family  unsure of this history  . Arthritis   . Chronic ulcer of calf (HCC)    Left  . Constipation   . Diabetes mellitus without complication ([place])   . Dyslipidemia   . GERD (gastroesophageal reflux disease)   . Hypertension   . Peripheral vascular disease ([place])   :  Past Surgical History:  Procedure Laterality Date  . ABDOMINAL AORTOGRAM W/LOWER EXTREMITY Left 12/27/2017   Procedure: ABDOMINAL AORTOGRAM W/LOWER EXTREMITY;  Surgeon: Algernon Huxley, MD;  Location: [facility];  Service: Cardiovascular;  Laterality: Left;  . ABDOMINAL HYSTERECTOMY    . AMPUTATION TOE Left 12/23/2017   Procedure: AMPUTATION TOE LEFT GREAT TOE;  Surgeon: Sharlotte Alamo, DPM;  Location: [facility];  Service: Podiatry;  Laterality: Left;  . ENDARTERECTOMY FEMORAL Left 09/08/2017   Procedure: ENDARTERECTOMY FEMORAL WITH CORMATRIX PATCH;  Surgeon: Algernon Huxley, MD;  Location: [facility];  Service: Vascular;  Laterality: Left;  . EYE SURGERY Bilateral    Cataract Extraction with IOL  . LOWER EXTREMITY ANGIOGRAPHY Left 07/22/2017   Procedure: Lower Extremity Angiography;  Surgeon: Algernon Huxley, MD;  Location: [facility];  Service: Cardiovascular;  Laterality: Left;  . LOWER EXTREMITY ANGIOGRAPHY Left 08/16/2017   Procedure: Lower Extremity Angiography;  Surgeon: Algernon Huxley, MD;  Location: [facility];  Service: Cardiovascular;  Laterality: Left;  Marland Kitchen MULTIPLE TOOTH EXTRACTIONS    :   Current  Outpatient Medications:  .  ACCU-CHEK FASTCLIX LANCETS MISC, TEST TID, Disp: , Rfl: 1 .  ACCU-CHEK GUIDE test strip, , Disp: , Rfl:  .  amLODipine (NORVASC) 10 MG tablet, Take 10 mg by mouth daily., Disp: , Rfl:  .  aspirin EC 81 MG tablet, Take 81 mg by mouth daily., Disp: , Rfl:  .  atorvastatin (LIPITOR) 10 MG tablet, Take 10 mg by mouth daily., Disp: , Rfl:  .  Blood Glucose Monitoring Suppl (ACCU-CHEK GUIDE) w/Device KIT, See admin instructions., Disp: , Rfl: 0 .  Calcium Carbonate-Vitamin D (CALCIUM 500  + D) 500-125 MG-UNIT TABS, Take by mouth., Disp: , Rfl:  .  Calcium-Magnesium-Zinc (CAL-MAG-ZINC PO), Take 1 tablet by mouth daily., Disp: , Rfl:  .  carvedilol (COREG) 6.25 MG tablet, Take 6.25 mg by mouth at bedtime., Disp: , Rfl:  .  Cholecalciferol (VITAMIN D3 GUMMIES ADULT PO), Take 1 tablet by mouth daily., Disp: , Rfl:  .  clopidogrel (PLAVIX) 75 MG tablet, Take 1 tablet (75 mg total) by mouth daily with breakfast., Disp: 30 tablet, Rfl: 1 .  doxycycline (VIBRA-TABS) 100 MG tablet, Take 1 tablet (100 mg total) by mouth every 12 (twelve) hours., Disp: 20 tablet, Rfl: 0 .  metFORMIN (GLUCOPHAGE) 1000 MG tablet, Take 1,000 mg by mouth 2 (two) times daily with a meal., Disp: , Rfl:  .  pantoprazole (PROTONIX) 20 MG tablet, Take 1 tablet (20 mg total) by mouth daily., Disp: 30 tablet, Rfl: 1 .  polyethylene glycol (MIRALAX / GLYCOLAX) packet, Take 17 g by mouth daily as needed (for constipation.). , Disp: , Rfl:  .  SANTYL ointment, Apply 1 application topically daily. Applied to wound of leg and dress wound, Disp: , Rfl: 0:  No Known Allergies:  No family history on file.:  Social History   Socioeconomic History  . Marital status: Legally Separated    Spouse name: Not on file  . Number of children: Not on file  . Years of education: Not on file  . Highest education level: Not on file  Occupational History  . Not on file  Social Needs  . Financial resource strain: Not on file  . Food insecurity:    Worry: Not on file    Inability: Not on file  . Transportation needs:    Medical: Not on file    Non-medical: Not on file  Tobacco Use  . Smoking status: Never Smoker  . Smokeless tobacco: Never Used  Substance and Sexual Activity  . Alcohol use: No  . Drug use: No  . Sexual activity: Not Currently  Lifestyle  . Physical activity:    Days per week: Not on file    Minutes per session: Not on file  . Stress: Not on file  Relationships  . Social connections:    Talks on phone:  Not on file    Gets together: Not on file    Attends religious service: Not on file    Active member of club or organization: Not on file    Attends meetings of clubs or organizations: Not on file    Relationship status: Not on file  . Intimate partner violence:    Fear of current or ex partner: Not on file    Emotionally abused: Not on file    Physically abused: Not on file    Forced sexual activity: Not on file  Other Topics Concern  . Not on file  Social History Narrative  . Not on  file  :  Pertinent items are noted in HPI.  Exam: Blood pressure (!) 152/69, pulse 73, temperature 98 F (36.7 C), temperature source Oral, resp. rate 17, height 5' 6.5" (1.689 m), weight 118 lb 6.4 oz (53.7 kg), SpO2 98 %.  ECOG 1 General appearance: Elderly woman appears younger than stated age. Head: atraumatic without any abnormalities. Eyes: conjunctivae/corneas clear. PERRL.  Sclera anicteric. Throat: lips, mucosa, and tongue normal; without oral thrush or ulcers. Resp: clear to auscultation bilaterally without rhonchi, wheezes or dullness to percussion. Cardio: regular rate and rhythm, S1, S2 normal, no murmur, click, rub or gallop GI: soft, non-tender; bowel sounds normal; no masses,  no organomegaly Skin: Skin color, texture, turgor normal. No rashes or lesions Lymph nodes: Cervical, supraclavicular, and axillary nodes normal. Neurologic: Grossly normal without any motor, sensory or deep tendon reflexes. Musculoskeletal: No joint deformity or effusion.  CBC    Component Value Date/Time   WBC 12.4 (H) 12/27/2017 0533   RBC 3.68 (L) 12/27/2017 0533   HGB 8.9 (L) 12/27/2017 0533   HCT 27.6 (L) 12/27/2017 0533   PLT 600 (H) 12/27/2017 0533   MCV 74.9 (L) 12/27/2017 0533   MCH 24.3 (L) 12/27/2017 0533   MCHC 32.4 12/27/2017 0533   RDW 21.3 (H) 12/27/2017 0533   LYMPHSABS 1.9 12/26/2017 0938   MONOABS 1.3 (H) 12/26/2017 0938   EOSABS 0.2 12/26/2017 0938   BASOSABS 0.1 12/26/2017 0938      Chemistry      Component Value Date/Time   NA 139 12/27/2017 0533   K 2.9 (L) 12/28/2017 0847   CL 102 12/27/2017 0533   CO2 26 12/27/2017 0533   BUN 12 12/27/2017 0533   CREATININE 0.97 12/27/2017 0533      Component Value Date/Time   CALCIUM 8.5 (L) 12/27/2017 0533   ALKPHOS 51 12/27/2017 0533   AST 16 12/27/2017 0533   ALT 9 (L) 12/27/2017 0533   BILITOT 0.6 12/27/2017 0533       Assessment and Plan:   84 year old woman with the following:  1.  Anemia: The etiology appears to be multifactorial with element of microcytosis, chronic disease and renal insufficiency.  This has been documented in January 2019 although she had an element of microcytosis dating back to 2015.  In October 2018 her hemoglobin was 10.5 with an elevated RDW and MCV 79.  The differential diagnosis was reviewed today with the patient and her family.  Iron deficiency anemia, vitamin B12, myelodysplastic syndrome, chronic disease anemia, anemia of renal insufficiency are also considerations.  I will obtain iron studies as well as a serum protein electrophoresis and B12 levels.  We have discussed strategies to boost her red cell production with replacing her iron if she is deficient.  Procrit or Aranesp could also be a possibility if her iron levels are normal.  Bone marrow biopsy will be deferred at this time unless other abnormalities or cytopenias are detected.  2.  Renal insufficiency: Likely related to long-standing hypertension and diabetes.  This could be contributing to her anemia.  I doubt a plasma cell disorder is also contributing to her renal insufficiency.  Serum protein electrophoresis will be completed.  3.  Follow-up: We will be in 3 months to follow her progress.  30  minutes was spent with the patient face-to-face today.  More than 50% of time was dedicated to patient counseling, education and coordination of her care.     Thank you for the referral.  I had the  pleasure of meeting Ms.  Sitzmann.  A copy of this consult has been forwarded to the requesting physician.

## 2018-05-26 LAB — ERYTHROPOIETIN: Erythropoietin: 17.5 m[IU]/mL (ref 2.6–18.5)

## 2018-05-27 LAB — MULTIPLE MYELOMA PANEL, SERUM
Albumin SerPl Elph-Mcnc: 3.2 g/dL (ref 2.9–4.4)
Albumin/Glob SerPl: 0.8 (ref 0.7–1.7)
Alpha 1: 0.2 g/dL (ref 0.0–0.4)
Alpha2 Glob SerPl Elph-Mcnc: 0.9 g/dL (ref 0.4–1.0)
B-GLOBULIN SERPL ELPH-MCNC: 0.9 g/dL (ref 0.7–1.3)
Gamma Glob SerPl Elph-Mcnc: 2.4 g/dL — ABNORMAL HIGH (ref 0.4–1.8)
Globulin, Total: 4.3 g/dL — ABNORMAL HIGH (ref 2.2–3.9)
IGA: 281 mg/dL (ref 64–422)
IGG (IMMUNOGLOBIN G), SERUM: 2495 mg/dL — AB (ref 700–1600)
IGM (IMMUNOGLOBULIN M), SRM: 179 mg/dL (ref 26–217)
TOTAL PROTEIN ELP: 7.5 g/dL (ref 6.0–8.5)

## 2018-06-24 DIAGNOSIS — E1140 Type 2 diabetes mellitus with diabetic neuropathy, unspecified: Secondary | ICD-10-CM | POA: Diagnosis not present

## 2018-06-24 DIAGNOSIS — L851 Acquired keratosis [keratoderma] palmaris et plantaris: Secondary | ICD-10-CM | POA: Diagnosis not present

## 2018-06-24 DIAGNOSIS — I739 Peripheral vascular disease, unspecified: Secondary | ICD-10-CM | POA: Diagnosis not present

## 2018-06-24 DIAGNOSIS — B351 Tinea unguium: Secondary | ICD-10-CM | POA: Diagnosis not present

## 2018-06-28 DIAGNOSIS — H9190 Unspecified hearing loss, unspecified ear: Secondary | ICD-10-CM | POA: Diagnosis not present

## 2018-06-28 DIAGNOSIS — Z6820 Body mass index (BMI) 20.0-20.9, adult: Secondary | ICD-10-CM | POA: Diagnosis not present

## 2018-07-04 ENCOUNTER — Ambulatory Visit: Payer: Medicare Other | Admitting: Podiatry

## 2018-07-07 DIAGNOSIS — H9190 Unspecified hearing loss, unspecified ear: Secondary | ICD-10-CM | POA: Diagnosis not present

## 2018-07-07 DIAGNOSIS — E1100 Type 2 diabetes mellitus with hyperosmolarity without nonketotic hyperglycemic-hyperosmolar coma (NKHHC): Secondary | ICD-10-CM | POA: Diagnosis not present

## 2018-07-07 DIAGNOSIS — Z0000 Encounter for general adult medical examination without abnormal findings: Secondary | ICD-10-CM | POA: Diagnosis not present

## 2018-07-07 DIAGNOSIS — I739 Peripheral vascular disease, unspecified: Secondary | ICD-10-CM | POA: Diagnosis not present

## 2018-07-11 ENCOUNTER — Ambulatory Visit (INDEPENDENT_AMBULATORY_CARE_PROVIDER_SITE_OTHER): Payer: Medicare Other | Admitting: Podiatry

## 2018-07-11 ENCOUNTER — Encounter: Payer: Self-pay | Admitting: Podiatry

## 2018-07-11 DIAGNOSIS — L84 Corns and callosities: Secondary | ICD-10-CM | POA: Diagnosis not present

## 2018-07-11 DIAGNOSIS — D689 Coagulation defect, unspecified: Secondary | ICD-10-CM

## 2018-07-12 ENCOUNTER — Encounter: Payer: Medicare Other | Attending: Nurse Practitioner | Admitting: Nurse Practitioner

## 2018-07-12 DIAGNOSIS — E1151 Type 2 diabetes mellitus with diabetic peripheral angiopathy without gangrene: Secondary | ICD-10-CM | POA: Diagnosis not present

## 2018-07-12 DIAGNOSIS — I10 Essential (primary) hypertension: Secondary | ICD-10-CM | POA: Diagnosis not present

## 2018-07-12 DIAGNOSIS — L97821 Non-pressure chronic ulcer of other part of left lower leg limited to breakdown of skin: Secondary | ICD-10-CM | POA: Diagnosis not present

## 2018-07-12 DIAGNOSIS — L97822 Non-pressure chronic ulcer of other part of left lower leg with fat layer exposed: Secondary | ICD-10-CM | POA: Diagnosis not present

## 2018-07-14 NOTE — Progress Notes (Signed)
Subjective:   Patient ID: [name], female   DOB: 84 y.o.   MRN: [id number]   HPI Patient presents with elongated nailbeds 1-5 both feet that are dystrophic and painful and patient tries to cut herself without relief   ROS      Objective:  Physical Exam  Neurovascular status unchanged with thick yellow brittle nailbeds 1-5 both feet     Assessment:  Mycotic nail infection with pain 1-5 both feet     Plan:  Debride painful nailbeds 1-5 both feet with no iatrogenic bleeding noted

## 2018-07-19 ENCOUNTER — Ambulatory Visit: Payer: Medicare Other | Admitting: Physician Assistant

## 2018-07-20 DIAGNOSIS — H10413 Chronic giant papillary conjunctivitis, bilateral: Secondary | ICD-10-CM | POA: Diagnosis not present

## 2018-07-20 DIAGNOSIS — Z961 Presence of intraocular lens: Secondary | ICD-10-CM | POA: Diagnosis not present

## 2018-07-20 DIAGNOSIS — E119 Type 2 diabetes mellitus without complications: Secondary | ICD-10-CM | POA: Diagnosis not present

## 2018-07-20 DIAGNOSIS — H353121 Nonexudative age-related macular degeneration, left eye, early dry stage: Secondary | ICD-10-CM | POA: Diagnosis not present

## 2018-07-22 ENCOUNTER — Encounter: Payer: Medicare Other | Admitting: Physician Assistant

## 2018-07-22 DIAGNOSIS — E1151 Type 2 diabetes mellitus with diabetic peripheral angiopathy without gangrene: Secondary | ICD-10-CM | POA: Diagnosis not present

## 2018-07-22 DIAGNOSIS — L97829 Non-pressure chronic ulcer of other part of left lower leg with unspecified severity: Secondary | ICD-10-CM | POA: Diagnosis not present

## 2018-07-22 DIAGNOSIS — I10 Essential (primary) hypertension: Secondary | ICD-10-CM | POA: Diagnosis not present

## 2018-07-22 DIAGNOSIS — L97821 Non-pressure chronic ulcer of other part of left lower leg limited to breakdown of skin: Secondary | ICD-10-CM | POA: Diagnosis not present

## 2018-07-24 NOTE — Progress Notes (Signed)
[name], [name] ([id number]) Visit Report for 07/12/2018 Allergy List Details Patient Name: [name], [name] Date of Service: 07/12/2018 12:30 PM Medical Record Number: [id number] Patient Account Number: [id number] Date of Birth/Sex: 11-11-1926 (84 y.o. F) Treating RN: [name] Primary Care [name]: [name] Other Clinician: Referring [name]: [name] Treating [name]/Extender: [name] Weeks in Treatment: 0 Allergies Active Allergies No Known Drug Allergies Allergy Notes Electronic Signature(s) Signed: 07/12/2018 4:48:53 PM By: [name] Entered By: [name] on 07/12/2018 12:46:23 [name], [name] ([id number]) -------------------------------------------------------------------------------- Arrival Information Details Patient Name: Alexa Lowes P. Date of Service: 07/12/2018 12:30 PM Medical Record Number: [id number] Patient Account Number: [id number] Date of Birth/Sex: 1926-08-18 (84 y.o. F) Treating RN: [name] Primary Care [name]: [name] Other Clinician: Referring [name]: [name] Treating [name]/Extender: [name] in Treatment: 0 Visit Information Patient Arrived: Walker Arrival Time: 12:36 Accompanied By: self Transfer Assistance: None Patient Identification Verified: Yes Secondary Verification Process Yes Completed: Patient Has Alerts: Yes Patient Alerts: ABI 03/22/18 R .68 L .88 History Since Last Visit Added or deleted any medications: No Any new allergies or adverse reactions: No Had a fall or experienced change in activities of daily living that may affect risk of falls: No Signs or symptoms of abuse/neglect since last visito No Hospitalized since last visit: No Implantable device outside of the clinic excluding cellular tissue based products placed in the center since last visit: No Has Dressing in Place as Prescribed: Yes Electronic Signature(s) Signed: 07/12/2018 1:18:01 PM By:  [name] Entered By: [name] on 07/12/2018 13:18:00 [name], OACZYSAY P. ([id number]) -------------------------------------------------------------------------------- Clinic Level of Care Assessment Details Patient Name: Alexa Lowes P. Date of Service: 07/12/2018 12:30 PM Medical Record Number: [id number] Patient Account Number: [id number] Date of Birth/Sex: [birth year] (84 y.o. F) Treating RN: [name] Primary Care Adamae Ricklefs: [name] Other Clinician: Referring [name]: [name] Treating Maalik Pinn/Extender: [name] in Treatment: 0 Clinic Level of Care Assessment Items TOOL 2 Quantity Score []  - Use when only an EandM is performed on the INITIAL visit 0 ASSESSMENTS - Nursing Assessment / Reassessment X - General Physical Exam (combine w/ comprehensive assessment (listed just below) when 1 20 performed on new pt. evals) X- 1 25 Comprehensive Assessment (HX, ROS, Risk Assessments, Wounds Hx, etc.) ASSESSMENTS - Wound and Skin Assessment / Reassessment X - Simple Wound Assessment / Reassessment - one wound 1 5 []  - 0 Complex Wound Assessment / Reassessment - multiple wounds []  - 0 Dermatologic / Skin Assessment (not related to wound area) ASSESSMENTS - Ostomy and/or Continence Assessment and Care []  - Incontinence Assessment and Management 0 []  - 0 Ostomy Care Assessment and Management (repouching, etc.) PROCESS - Coordination of Care X - Simple Patient / Family Education for ongoing care 1 15 []  - 0 Complex (extensive) Patient / Family Education for ongoing care X- 1 10 Staff obtains Programmer, systems, Records, Test Results / Process Orders []  - 0 Staff telephones HHA, Nursing Homes / Clarify orders / etc []  - 0 Routine Transfer to another Facility (non-emergent condition) []  - 0 Routine Hospital Admission (non-emergent condition) X- 1 15 New Admissions / Biomedical engineer / Ordering NPWT, Apligraf, etc. []  - 0 Emergency Hospital Admission  (emergent condition) []  - 0 Simple Discharge Coordination X- 1 15 Complex (extensive) Discharge Coordination PROCESS - Special Needs []  - Pediatric / Minor Patient Management 0 []  - 0 Isolation Patient Management [name], [name] ([id number]) []  - 0 Hearing / Language / Visual special needs []  -  0 Assessment of Community assistance (transportation, D/C planning, etc.) []  - 0 Additional assistance / Altered mentation []  - 0 Support Surface(s) Assessment (bed, cushion, seat, etc.) INTERVENTIONS - Wound Cleansing / Measurement X - Wound Imaging (photographs - any number of wounds) 1 5 []  - 0 Wound Tracing (instead of photographs) X- 1 5 Simple Wound Measurement - one wound []  - 0 Complex Wound Measurement - multiple wounds X- 1 5 Simple Wound Cleansing - one wound []  - 0 Complex Wound Cleansing - multiple wounds INTERVENTIONS - Wound Dressings []  - Small Wound Dressing one or multiple wounds 0 X- 1 15 Medium Wound Dressing one or multiple wounds []  - 0 Large Wound Dressing one or multiple wounds []  - 0 Application of Medications - injection INTERVENTIONS - Miscellaneous []  - External ear exam 0 []  - 0 Specimen Collection (cultures, biopsies, blood, body fluids, etc.) []  - 0 Specimen(s) / Culture(s) sent or taken to Lab for analysis []  - 0 Patient Transfer (multiple staff / Civil Service fast streamer / Similar devices) []  - 0 Simple Staple / Suture removal (25 or less) []  - 0 Complex Staple / Suture removal (26 or more) []  - 0 Hypo / Hyperglycemic Management (close monitor of Blood Glucose) []  - 0 Ankle / Brachial Index (ABI) - do not check if billed separately Has the patient been seen at the hospital within the last three years: Yes Total Score: 135 Level Of Care: New/Established - Level 4 Electronic Signature(s) Signed: 07/12/2018 6:04:06 PM By: [name], BSN, RN, CWS, Kim RN, BSN Entered By: [name], BSN, RN, CWS, Kim on 07/12/2018 13:19:02 [name]  ([id number]) -------------------------------------------------------------------------------- Encounter Discharge Information Details Patient Name: Alexa Lowes P. Date of Service: 07/12/2018 12:30 PM Medical Record Number: [id number] Patient Account Number: [id number] Date of Birth/Sex: [birth year] (84 y.o. F) Treating RN: Roger Shelter Primary Care Kensley Lares: [name] Other Clinician: Referring Carlea Badour: [name] Treating Averi Kilty/Extender: [name] in Treatment: 0 Encounter Discharge Information Items Discharge Condition: Stable Ambulatory Status: Walker Discharge Destination: Home Transportation: Private Auto Schedule Follow-up Appointment: Yes Clinical Summary of Care: Electronic Signature(s) Signed: 07/13/2018 9:21:22 AM By: Roger Shelter Entered By: Roger Shelter on 07/12/2018 13:30:21 Hochstetler, [name] ([id number]) -------------------------------------------------------------------------------- Lower Extremity Assessment Details Patient Name: Alexa Lowes P. Date of Service: 07/12/2018 12:30 PM Medical Record Number: [id number] Patient Account Number: [id number] Date of Birth/Sex: December 09, 1925 (84 y.o. F) Treating RN: [name] Primary Care Elliyah Liszewski: [name] Other Clinician: Referring Maximilliano Kersh: [name] Treating Timothy Townsel/Extender: [name] in Treatment: 0 Edema Assessment Assessed: [Left: No] [Right: No] Edema: [Left: Yes] [Right: Yes] Calf Left: Right: Point of Measurement: 36 cm From Medial Instep 31.6 cm 28.4 cm Ankle Left: Right: Point of Measurement: 12 cm From Medial Instep 19.6 cm 18.7 cm Vascular Assessment Pulses: Dorsalis Pedis Palpable: [Left:Yes] [Right:Yes] Doppler Audible: [Left:Yes] [Right:Yes] Posterior Tibial Palpable: [Left:No] [Right:No] Doppler Audible: [Left:Yes] [Right:Yes] Extremity colors, hair growth, and conditions: Extremity Color: [Left:Hyperpigmented]  [Right:Hyperpigmented] Hair Growth on Extremity: [Left:No] [Right:No] Temperature of Extremity: [Left:Warm] [Right:Warm] Capillary Refill: [Left:< 3 seconds] [Right:< 3 seconds] Toe Nail Assessment Left: Right: Thick: Yes Yes Discolored: No No Deformed: No No Improper Length and Hygiene: No No Electronic Signature(s) Signed: 07/12/2018 4:48:53 PM By: [name] Entered By: [name] on 07/12/2018 13:00:18 Bangura, XLKGMWNU P. ([id number]) -------------------------------------------------------------------------------- Multi Wound Chart Details Patient Name: Alexa Lowes P. Date of Service: 07/12/2018 12:30 PM Medical Record Number: [id number] Patient Account Number: [id number] Date of Birth/Sex: Nov 16, 1926 (85 y.o. F) Treating RN: [name] Primary  Care Isolde Skaff: [name] Other Clinician: Referring Cayle Thunder: [name] Treating Denys Salinger/Extender: [name] in Treatment: 0 Vital Signs Height(in): 65 Pulse(bpm): 47 Weight(lbs): 125 Blood Pressure(mmHg): 158/64 Body Mass Index(BMI): 21 Temperature(F): 97.6 Respiratory Rate 16 (breaths/min): Photos: [4:No Photos] [5:No Photos] [N/A:N/A] Wound Location: [4:Left Lower Leg - Anterior] [5:Left Lower Leg - Medial] [N/A:N/A] Wounding Event: [4:Gradually Appeared] [5:Gradually Appeared] [N/A:N/A] Primary Etiology: [4:Venous Leg Ulcer] [5:Venous Leg Ulcer] [N/A:N/A] Comorbid History: [4:Anemia, Hypertension, Type II Diabetes, Osteoarthritis] [5:Anemia, Hypertension, Type II Diabetes, Osteoarthritis] [N/A:N/A] Date Acquired: [4:06/28/2018] [5:06/28/2018] [N/A:N/A] Weeks of Treatment: [4:0] [5:0] [N/A:N/A] Wound Status: [4:Open] [5:Open] [N/A:N/A] Measurements L x W x D [4:0.6x0.5x0.1] [5:0.6x0.3x0.1] [N/A:N/A] (cm) Area (cm) : [4:0.236] [5:0.141] [N/A:N/A] Volume (cm) : [4:0.024] [5:0.014] [N/A:N/A] Classification: [4:Partial Thickness] [5:Partial Thickness] [N/A:N/A] Exudate Amount: [4:Large] [5:Large]  [N/A:N/A] Exudate Type: [4:Serous] [5:Serous] [N/A:N/A] Exudate Color: [4:amber] [5:amber] [N/A:N/A] Wound Margin: [4:Flat and Intact] [5:Flat and Intact] [N/A:N/A] Granulation Amount: [4:Large (67-100%)] [5:Large (67-100%)] [N/A:N/A] Granulation Quality: [4:Pink] [5:Pink] [N/A:N/A] Necrotic Amount: [4:None Present (0%)] [5:None Present (0%)] [N/A:N/A] Exposed Structures: [4:Fascia: No Fat Layer (Subcutaneous Tissue) Exposed: No Tendon: No Muscle: No Joint: No Bone: No] [5:Fascia: No Fat Layer (Subcutaneous Tissue) Exposed: No Tendon: No Muscle: No Joint: No Bone: No] [N/A:N/A] Epithelialization: [4:Small (1-33%)] [5:None] [N/A:N/A] Periwound Skin Texture: [4:Excoriation: No Induration: No Callus: No Crepitus: No Rash: No Scarring: No] [5:Excoriation: No Induration: No Callus: No Crepitus: No Rash: No Scarring: No] [N/A:N/A] Periwound Skin Moisture: [4:Maceration: No Dry/Scaly: No] [5:Maceration: No Dry/Scaly: No] [N/A:N/A] Periwound Skin Color: Hemosiderin Staining: Yes Hemosiderin Staining: Yes N/A Atrophie Blanche: No Atrophie Blanche: No Cyanosis: No Cyanosis: No Ecchymosis: No Ecchymosis: No Erythema: No Erythema: No Mottled: No Mottled: No Pallor: No Pallor: No Rubor: No Rubor: No Temperature: No Abnormality No Abnormality N/A Tenderness on Palpation: No Yes N/A Wound Preparation: Ulcer Cleansing: Ulcer Cleansing: N/A Rinsed/Irrigated with Saline Rinsed/Irrigated with Saline Topical Anesthetic Applied: Topical Anesthetic Applied: Other: lidocaine 4% Other: lidocaine 4% Treatment Notes Wound #4 (Left, Anterior Lower Leg) 1. Cleansed with: Clean wound with Normal Saline 2. Anesthetic Topical Lidocaine 4% cream to wound bed prior to debridement 4. Dressing Applied: Mepitel 5. Secondary Dressing Applied Kerlix/Conform Notes ace wrap Wound #5 (Left, Medial Lower Leg) 1. Cleansed with: Clean wound with Normal Saline 2. Anesthetic Topical Lidocaine 4% cream to  wound bed prior to debridement 4. Dressing Applied: Mepitel 5. Secondary Dressing Applied Kerlix/Conform Notes ace wrap Electronic Signature(s) Signed: 07/12/2018 1:36:55 PM By: [name] Entered By: [name] on 07/12/2018 13:36:55 Passow, [name] ([id number]) -------------------------------------------------------------------------------- [place] Details Patient Name: Alexa Lowes P. Date of Service: 07/12/2018 12:30 PM Medical Record Number: [id number] Patient Account Number: [id number] Date of Birth/Sex: [birth year] (84 y.o. F) Treating RN: [name] Primary Care Lia Vigilante: [name] Other Clinician: Referring Sofya Moustafa: [name] Treating Charlesetta Milliron/Extender: [name] in Treatment: 0 Active Inactive ` Orientation to the Wound Care Program Nursing Diagnoses: Knowledge deficit related to the wound healing center program Goals: Patient/caregiver will verbalize understanding of the [place] Program Date Initiated: 07/12/2018 Target Resolution Date: 08/05/2018 Goal Status: Active Interventions: Provide education on orientation to the wound center Notes: ` Wound/Skin Impairment Nursing Diagnoses: Impaired tissue integrity Goals: Ulcer/skin breakdown will have a volume reduction of 30% by week 4 Date Initiated: 07/12/2018 Target Resolution Date: 08/12/2018 Goal Status: Active Interventions: Provide education on ulcer and skin care Treatment Activities: Skin care regimen initiated : 07/12/2018 Notes: Electronic Signature(s) Signed: 07/12/2018 6:04:06 PM By: [name], BSN, RN, CWS, Kim  RN, BSN Entered By: [name], BSN, RN, CWS, Kim on 07/12/2018 13:15:39 Kerri Perches, [name] ([id number]) -------------------------------------------------------------------------------- Pain Assessment Details Patient Name: Alexa Lowes P. Date of Service: 07/12/2018 12:30 PM Medical Record Number: [id number] Patient Account Number:  [id number] Date of Birth/Sex: 08/11/1926 (84 y.o. F) Treating RN: [name] Primary Care Jacqeline Broers: [name] Other Clinician: Referring Bailyn Spackman: [name] Treating Keondre Markson/Extender: [name] in Treatment: 0 Active Problems Location of Pain Severity and Description of Pain Patient Has Paino No Site Locations Pain Management and Medication Current Pain Management: Electronic Signature(s) Signed: 07/12/2018 4:48:53 PM By: [name] Entered By: [name] on 07/12/2018 12:39:42 Dedmon, [name] ([id number]) -------------------------------------------------------------------------------- Patient/Caregiver Education Details Patient Name: Alexa Lowes P. Date of Service: 07/12/2018 12:30 PM Medical Record Number: [id number] Patient Account Number: [id number] Date of Birth/Gender: 09/09/1926 (84 y.o. F) Treating RN: Roger Shelter Primary Care Physician: [name] Other Clinician: Referring Physician: Lucianne [name] Treating Physician/Extender: [name] in Treatment: 0 Education Assessment Education Provided To: Patient Education Topics Provided Wound/Skin Impairment: Handouts: Caring for Your Ulcer Methods: Explain/Verbal Responses: State content correctly Electronic Signature(s) Signed: 07/13/2018 9:21:22 AM By: Roger Shelter Entered By: Roger Shelter on 07/12/2018 13:30:32 Vanderpol, [name] ([id number]) -------------------------------------------------------------------------------- Wound Assessment Details Patient Name: Alexa Lowes P. Date of Service: 07/12/2018 12:30 PM Medical Record Number: [id number] Patient Account Number: [id number] Date of Birth/Sex: 09/26/1926 (84 y.o. F) Treating RN: [name] Primary Care Adriana Lina: [name] Other Clinician: Referring Jevante Hollibaugh: [name] Treating Neri Vieyra/Extender: [name] in Treatment: 0 Wound Status Wound Number: 4 Primary Venous Leg  Ulcer Etiology: Wound Location: Left Lower Leg - Anterior Wound Status: Open Wounding Event: Gradually Appeared Comorbid Anemia, Hypertension, Type II Diabetes, Date Acquired: 06/28/2018 History: Osteoarthritis Weeks Of Treatment: 0 Clustered Wound: No Wound Measurements Length: (cm) 0.6 Width: (cm) 0.5 Depth: (cm) 0.1 Area: (cm) 0.236 Volume: (cm) 0.024 % Reduction in Area: % Reduction in Volume: Epithelialization: Small (1-33%) Tunneling: No Undermining: No Wound Description Classification: Partial Thickness Foul Odo Wound Margin: Flat and Intact Slough/F Exudate Amount: Large Exudate Type: Serous Exudate Color: amber r After Cleansing: No ibrino No Wound Bed Granulation Amount: Large (67-100%) Exposed Structure Granulation Quality: Pink Fascia Exposed: No Necrotic Amount: None Present (0%) Fat Layer (Subcutaneous Tissue) Exposed: No Tendon Exposed: No Muscle Exposed: No Joint Exposed: No Bone Exposed: No Periwound Skin Texture Texture Color No Abnormalities Noted: No No Abnormalities Noted: No Callus: No Atrophie Blanche: No Crepitus: No Cyanosis: No Excoriation: No Ecchymosis: No Induration: No Erythema: No Rash: No Hemosiderin Staining: Yes Scarring: No Mottled: No Pallor: No Moisture Rubor: No No Abnormalities Noted: No Dry / Scaly: No Temperature / Pain Maceration: No Temperature: No Abnormality Axford, Kaelei P. ([id number]) Wound Preparation Ulcer Cleansing: Rinsed/Irrigated with Saline Topical Anesthetic Applied: Other: lidocaine 4%, Treatment Notes Wound #4 (Left, Anterior Lower Leg) 1. Cleansed with: Clean wound with Normal Saline 2. Anesthetic Topical Lidocaine 4% cream to wound bed prior to debridement 4. Dressing Applied: Mepitel 5. Secondary Dressing Applied Kerlix/Conform Notes ace wrap Electronic Signature(s) Signed: 07/12/2018 4:48:53 PM By: [name] Entered By: [name] on 07/12/2018  12:53:57 Suminski, [name] ([id number]) -------------------------------------------------------------------------------- Wound Assessment Details Patient Name: Alexa Lowes P. Date of Service: 07/12/2018 12:30 PM Medical Record Number: [id number] Patient Account Number: [id number] Date of Birth/Sex: 1926/01/25 (84 y.o. F) Treating RN: [name] Primary Care Surina Storts: [name] Other Clinician: Referring Wylan Gentzler: [name] Treating Shresta Risden/Extender: [name] in Treatment: 0 Wound Status Wound Number: 5 Primary Venous Leg  Ulcer Etiology: Wound Location: Left Lower Leg - Medial Wound Status: Open Wounding Event: Gradually Appeared Comorbid Anemia, Hypertension, Type II Diabetes, Date Acquired: 06/28/2018 History: Osteoarthritis Weeks Of Treatment: 0 Clustered Wound: No Wound Measurements Length: (cm) 0.6 Width: (cm) 0.3 Depth: (cm) 0.1 Area: (cm) 0.141 Volume: (cm) 0.014 % Reduction in Area: % Reduction in Volume: Epithelialization: None Tunneling: No Undermining: No Wound Description Classification: Partial Thickness Foul Odo Wound Margin: Flat and Intact Slough/F Exudate Amount: Large Exudate Type: Serous Exudate Color: amber r After Cleansing: No ibrino No Wound Bed Granulation Amount: Large (67-100%) Exposed Structure Granulation Quality: Pink Fascia Exposed: No Necrotic Amount: None Present (0%) Fat Layer (Subcutaneous Tissue) Exposed: No Tendon Exposed: No Muscle Exposed: No Joint Exposed: No Bone Exposed: No Periwound Skin Texture Texture Color No Abnormalities Noted: No No Abnormalities Noted: No Callus: No Atrophie Blanche: No Crepitus: No Cyanosis: No Excoriation: No Ecchymosis: No Induration: No Erythema: No Rash: No Hemosiderin Staining: Yes Scarring: No Mottled: No Pallor: No Moisture Rubor: No No Abnormalities Noted: No Dry / Scaly: No Temperature / Pain Maceration: No Temperature: No  Abnormality Tenderness on Palpation: Yes Dangler, Tanikka P. ([id number]) Wound Preparation Ulcer Cleansing: Rinsed/Irrigated with Saline Topical Anesthetic Applied: Other: lidocaine 4%, Treatment Notes Wound #5 (Left, Medial Lower Leg) 1. Cleansed with: Clean wound with Normal Saline 2. Anesthetic Topical Lidocaine 4% cream to wound bed prior to debridement 4. Dressing Applied: Mepitel 5. Secondary Dressing Applied Kerlix/Conform Notes ace wrap Electronic Signature(s) Signed: 07/12/2018 4:48:53 PM By: [name] Entered By: [name] on 07/12/2018 12:56:09 [place], [name] ([id number]) -------------------------------------------------------------------------------- Vitals Details Patient Name: Alexa Lowes P. Date of Service: 07/12/2018 12:30 PM Medical Record Number: [id number] Patient Account Number: [id number] Date of Birth/Sex: Jun 24, 1926 (84 y.o. F) Treating RN: [name] Primary Care Yutaka Holberg: [name] Other Clinician: Referring Raena Pau: [name] Treating Pamela Intrieri/Extender: [name] in Treatment: 0 Vital Signs Time Taken: 12:43 Temperature (F): 97.6 Height (in): 65 Pulse (bpm): 78 Source: Measured Respiratory Rate (breaths/min): 16 Weight (lbs): 125 Blood Pressure (mmHg): 158/64 Source: Measured Reference Range: 80 - 120 mg / dl Body Mass Index (BMI): 20.8 Electronic Signature(s) Signed: 07/12/2018 4:48:53 PM By: [name] Entered By: [name] on 07/12/2018 12:44:42

## 2018-07-24 NOTE — Progress Notes (Signed)
[name] ([id number]) Visit Report for 07/12/2018 Abuse/Suicide Risk Screen Details Patient Name: [name], [name]. Date of Service: 07/12/2018 12:30 PM Medical Record Number: [id number] Patient Account Number: [id number] Date of Birth/Sex: 1926-10-30 (84 y.o. F) Treating RN: Montey Hora Primary Care Margree Gimbel: Lucianne Lei Other Clinician: Referring Dalaina Tates: Lucianne Lei Treating Lukah Goswami/Extender: Cathie Olden in Treatment: 0 Abuse/Suicide Risk Screen Items Answer ABUSE/SUICIDE RISK SCREEN: Has anyone close to you tried to hurt or harm you recentlyo No Do you feel uncomfortable with anyone in your familyo No Has anyone forced you do things that you didnot want to doo No Do you have any thoughts of harming yourselfo No Patient displays signs or symptoms of abuse and/or neglect. No Electronic Signature(s) Signed: 07/12/2018 4:48:53 PM By: Montey Hora Entered By: Montey Hora on 07/12/2018 13:01:46 Gailey, [name] ([id number]) -------------------------------------------------------------------------------- Activities of Daily Living Details Patient Name: [name]. Date of Service: 07/12/2018 12:30 PM Medical Record Number: [id number] Patient Account Number: [id number] Date of Birth/Sex: [birth year] (84 y.o. F) Treating RN: Montey Hora Primary Care [place] Throckmorton: Lucianne Lei Other Clinician: Referring Adraine Biffle: Lucianne Lei Treating Mairlyn Tegtmeyer/Extender: Cathie Olden in Treatment: 0 Activities of Daily Living Items Answer Activities of Daily Living (Please select one for each item) Drive Automobile Not Able Take Medications Completely Able Use Telephone Completely Able Care for Appearance Completely Able Use Toilet Completely Able Bath / Shower Need Assistance Dress Self Completely Able Feed Self Completely Able Walk Completely Able Get In / Out Bed Completely [place]  Need Assistance Shop for Self Need Assistance Electronic Signature(s) Signed: 07/12/2018 1:07:11 PM By: Montey Hora Entered By: Montey Hora on 07/12/2018 13:07:10 Lemma, [name] ([id number]) -------------------------------------------------------------------------------- Education Assessment Details Patient Name: [name]. Date of Service: 07/12/2018 12:30 PM Medical Record Number: [id number] Patient Account Number: [id number] Date of Birth/Sex: Aug 02, 1926 (84 y.o. F) Treating RN: Montey Hora Primary Care Eufemio Strahm: Lucianne Lei Other Clinician: Referring Jamielyn Petrucci: Lucianne Lei Treating Delphia Kaylor/Extender: Cathie Olden in Treatment: 0 Primary Learner Assessed: Patient Learning Preferences/Education Level/Primary Language Learning Preference: Explanation, Demonstration, Printed Material Highest Education Level: College or Above Preferred Language: English Cognitive Barrier Assessment/Beliefs Language Barrier: No Translator Needed: No Memory Deficit: No Emotional Barrier: No Cultural/Religious Beliefs Affecting Medical Care: No Physical Barrier Assessment Impaired Vision: No Impaired Hearing: No Decreased Hand dexterity: No Knowledge/Comprehension Assessment Knowledge Level: Medium Comprehension Level: Medium Ability to understand written Medium instructions: Ability to understand verbal Medium instructions: Motivation Assessment Anxiety Level: Calm Cooperation: Cooperative Education Importance: Acknowledges Need Interest in Health Problems: Asks Questions Perception: Coherent Willingness to Engage in Self- Medium Management Activities: Readiness to Engage in Self- Medium Management Activities: Electronic Signature(s) Signed: 07/12/2018 1:12:32 PM By: Montey Hora Entered By: Montey Hora on 07/12/2018 13:12:32 Angeletti, [name] ([id number]) -------------------------------------------------------------------------------- Fall Risk  Assessment Details Patient Name: [name]. Date of Service: 07/12/2018 12:30 PM Medical Record Number: [id number] Patient Account Number: [id number] Date of Birth/Sex: 1926/08/11 (84 y.o. F) Treating RN: Montey Hora Primary Care Yochanan Eddleman: Lucianne Lei Other Clinician: Referring Jedaiah Rathbun: Lucianne Lei Treating Nandita Mathenia/Extender: Cathie Olden in Treatment: 0 Fall Risk Assessment Items Have you had 2 or more falls in the last 12 monthso 0 No Have you had any fall that resulted in injury in the last 12 monthso 0 No FALL RISK ASSESSMENT: History of falling - immediate or within 3 months 0 No Secondary diagnosis 0 No Ambulatory aid None/bed rest/wheelchair/nurse 0 No Crutches/cane/walker 15 Yes Furniture 0 No  IV Access/Saline Lock 0 No Gait/Training Normal/bed rest/immobile 0 No Weak 10 Yes Impaired 0 No Mental Status Oriented to own ability 0 Yes Electronic Signature(s) Signed: 07/12/2018 1:07:58 PM By: Montey Hora Entered By: Montey Hora on 07/12/2018 13:07:58 Mehlberg, [name]. ([id number]) -------------------------------------------------------------------------------- Foot Assessment Details Patient Name: [name]. Date of Service: 07/12/2018 12:30 PM Medical Record Number: [id number] Patient Account Number: [id number] Date of Birth/Sex: 1925/12/06 (84 y.o. F) Treating RN: Montey Hora Primary Care Raif Chachere: Lucianne Lei Other Clinician: Referring Mackay Hanauer: Lucianne Lei Treating Analina Filla/Extender: Cathie Olden in Treatment: 0 Foot Assessment Items Site Locations + = Sensation present, - = Sensation absent, C = Callus, U = Ulcer R = Redness, W = Warmth, M = Maceration, PU = Pre-ulcerative lesion F = Fissure, S = Swelling, D = Dryness Assessment Right: Left: Other Deformity: No No Prior Foot Ulcer: No No Prior Amputation: No No Charcot Joint: No No Ambulatory Status: Ambulatory With Help Assistance Device: Walker Gait:  Steady Electronic Signature(s) Signed: 07/12/2018 1:12:04 PM By: Montey Hora Entered By: Montey Hora on 07/12/2018 13:12:04 Philemon, [name]. ([id number]) -------------------------------------------------------------------------------- Nutrition Risk Assessment Details Patient Name: [name]. Date of Service: 07/12/2018 12:30 PM Medical Record Number: [id number] Patient Account Number: [id number] Date of Birth/Sex: [birth year] (84 y.o. F) Treating RN: Montey Hora Primary Care Carston Riedl: Lucianne Lei Other Clinician: Referring Lisa Blakeman: Lucianne Lei Treating [place] Teters/Extender: Cathie Olden in Treatment: 0 Height (in): 65 Weight (lbs): 125 Body Mass Index (BMI): 20.8 Nutrition Risk Assessment Items NUTRITION RISK SCREEN: I have an illness or condition that made me change the kind and/or amount of 0 No food I eat I eat fewer than two meals per day 0 No I eat few fruits and vegetables, or milk products 0 No I have three or more drinks of beer, liquor or wine almost every day 0 No I have tooth or mouth problems that make it hard for me to eat 0 No I don't always have enough money to buy the food I need 0 No I eat alone most of the time 0 No I take three or more different prescribed or over-the-counter drugs a day 1 Yes Without wanting to, I have lost or gained 10 pounds in the last six months 0 No I am not always physically able to shop, cook and/or feed myself 0 No Nutrition Protocols Good Risk Protocol 0 No interventions needed Moderate Risk Protocol Electronic Signature(s) Signed: 07/12/2018 1:11:41 PM By: Montey Hora Entered By: Montey Hora on 07/12/2018 13:11:40

## 2018-07-24 NOTE — Progress Notes (Signed)
Alexa, [name] ([id number]) Visit Report for 07/12/2018 Chief Complaint Document Details Patient Name: [name], [name] Date of Service: 07/12/2018 12:30 PM Medical Record Number: [id number] Patient Account Number: [id number] Date of Birth/Sex: Jun 17, 1926 (84 y.o. F) Treating RN: Ahmed Prima Primary Care Provider: Lucianne Lei Other Clinician: Referring Provider: Lucianne Lei Treating Provider/Extender: Cathie Olden in Treatment: 0 Information Obtained from: Patient Chief Complaint left lower leg ulcer Electronic Signature(s) Signed: 07/12/2018 1:37:50 PM By: Lawanda Cousins Entered By: Lawanda Cousins on 07/12/2018 13:37:50 Samaras, GGEZMOQH P. ([id number]) -------------------------------------------------------------------------------- HPI Details Patient Name: [name] P. Date of Service: 07/12/2018 12:30 PM Medical Record Number: [id number] Patient Account Number: [id number] Date of Birth/Sex: 12/26/1925 (84 y.o. F) Treating RN: Ahmed Prima Primary Care Provider: Lucianne Lei Other Clinician: Referring Provider: Lucianne Lei Treating Provider/Extender: Cathie Olden in Treatment: 0 History of Present Illness HPI Description: 07/12/18-she is seen for initial evaluation for 2 partial-thickness openings to the left anterior and left medial lower extremity. She states that these started as spontaneous blisters, she has been treating with Band-Aid. Her biggest complaint is of slight edema and large amount of serous fluid. She denies pain. We will treat with ace wrap compression; she will follow up later in the week to assess for tolerance of compression. Electronic Signature(s) Signed: 07/12/2018 1:54:43 PM By: Lawanda Cousins Entered By: Lawanda Cousins on 07/12/2018 13:54:43 Foutz, [name] ([id number]) -------------------------------------------------------------------------------- Physical Exam Details Patient Name: [name] P. Date of  Service: 07/12/2018 12:30 PM Medical Record Number: [id number] Patient Account Number: [id number] Date of Birth/Sex: February 23, 1926 (84 y.o. F) Treating RN: Ahmed Prima Primary Care Provider: Lucianne Lei Other Clinician: Referring Provider: Lucianne Lei Treating Provider/Extender: Cathie Olden in Treatment: 0 Respiratory respirations are even and unlabored. clear throughout. Cardiovascular s1 s2 regular rate and rhythm. LLE- non-palpable, audible doppler. LLE- pitting edema, hyperpigmentation, warm to touch. Musculoskeletal ambulated with walker assistance. Psychiatric . calm, cooperative. Electronic Signature(s) Signed: 07/12/2018 1:56:06 PM By: Lawanda Cousins Entered By: Lawanda Cousins on 07/12/2018 13:56:05 Caples, [name] ([id number]) -------------------------------------------------------------------------------- Physician Orders Details Patient Name: [name] P. Date of Service: 07/12/2018 12:30 PM Medical Record Number: [id number] Patient Account Number: [id number] Date of Birth/Sex: 12-12-1925 (84 y.o. F) Treating RN: Cornell Barman Primary Care Provider: Lucianne Lei Other Clinician: Referring Provider: Lucianne Lei Treating Provider/Extender: Cathie Olden in Treatment: 0 Verbal / Phone Orders: No Diagnosis Coding Wound Cleansing Wound #4 Left,Anterior Lower Leg o Cleanse wound with mild soap and water Wound #5 Left,Medial Lower Leg o Cleanse wound with mild soap and water Anesthetic (add to Medication List) Wound #4 Left,Anterior Lower Leg o Topical Lidocaine 4% cream applied to wound bed prior to debridement (In Clinic Only). Wound #5 Left,Medial Lower Leg o Topical Lidocaine 4% cream applied to wound bed prior to debridement (In Clinic Only). Primary Wound Dressing Wound #4 Left,Anterior Lower Leg o Mepitel One Contact layer Wound #5 Left,Medial Lower Leg o Mepitel One Contact layer Secondary Dressing Wound #4 Left,Anterior Lower  Leg o Conform/Kerlix Wound #5 Left,Medial Lower Leg o Conform/Kerlix Dressing Change Frequency Wound #4 Left,Anterior Lower Leg o Other: - Leave dressing in place until Nurse Visit on Thursday or Friday of this week. Wound #5 Left,Medial Lower Leg o Other: - Leave dressing in place until Nurse Visit on Thursday or Friday of this week. Follow-up Appointments Wound #4 Left,Anterior Lower Leg o Return Appointment in 1 week. o Nurse Visit as needed - Thursday or Friday Wound #5 Left,Medial Lower Leg o Return Appointment in 1  week. o Nurse Visit as needed - Thursday or Friday [name], [name] P. ([id number]) Edema Control Wound #4 Left,Anterior Lower Leg o Other: - Ace Wrap Wound #5 Left,Medial Lower Leg o Other: - Ace Wrap Electronic Signature(s) Signed: 07/12/2018 5:29:48 PM By: Lawanda Cousins Signed: 07/12/2018 6:04:06 PM By: Gretta Cool, BSN, RN, CWS, Kim RN, BSN Entered By: Gretta Cool, BSN, RN, CWS, Kim on 07/12/2018 13:20:33 [name], [name] ([id number]) -------------------------------------------------------------------------------- Problem List Details Patient Name: [name] P. Date of Service: 07/12/2018 12:30 PM Medical Record Number: [id number] Patient Account Number: [id number] Date of Birth/Sex: 04/05/1926 (84 y.o. 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  ([id number]) -------------------------------------------------------------------------------- Progress Note Details Patient Name: [name] P. Date of Service: 07/12/2018 12:30 PM Medical Record Number: [id number] Patient Account Number: [id number] Date of Birth/Sex: 12-09-1925 (84 y.o. F) Treating RN: Ahmed Prima Primary Care Provider: Lucianne Lei Other Clinician: Referring Provider: Lucianne Lei Treating Provider/Extender: Cathie Olden in Treatment: 0 Subjective Chief Complaint Information obtained from Patient left lower leg ulcer History of Present Illness (HPI) 07/12/18-she is seen for initial evaluation for 2 partial-thickness openings to the left anterior and left medial lower extremity. She states that these started as spontaneous blisters, she has been treating with Band-Aid. Her biggest complaint is of slight edema and large amount of serous fluid. She denies pain. We will treat with ace wrap compression; she will follow up later in the week to assess for tolerance of compression. Wound History Patient presents with 2 open wounds that have been present for approximately 2 weeks. Patient has been treating wounds in the following manner: bandaid. Laboratory tests have been performed in the last month. Patient reportedly has not tested positive for an antibiotic resistant organism. Patient reportedly has not tested positive for osteomyelitis. Patient reportedly has had testing performed to evaluate circulation in the legs. Patient experiences the following problems associated with their wounds: swelling. Patient History Information obtained from Patient. Allergies No Known Drug Allergies Family History Diabetes - Mother,Siblings, Heart Disease - Paternal Grandparents, Hypertension - Paternal Grandparents, No family history of Cancer, Hereditary Spherocytosis, Kidney Disease, Lung Disease, Seizures, Stroke, Thyroid Problems, Tuberculosis. Social History Never  smoker, Marital Status - Separated, Alcohol Use - Never, Drug Use - No History, Caffeine Use - Rarely. Medical History Eyes Denies history of Cataracts, Glaucoma, Optic Neuritis Ear/Nose/Mouth/Throat Denies history of Chronic sinus problems/congestion, Middle ear problems Hematologic/Lymphatic Denies history of Hemophilia, Human Immunodeficiency Virus, Lymphedema, Sickle Cell Disease Respiratory Denies history of Aspiration, Asthma, Chronic Obstructive Pulmonary Disease (COPD), Pneumothorax, Sleep Apnea, Tuberculosis Cardiovascular Denies history of Angina, Arrhythmia, Congestive Heart Failure, Coronary Artery Disease, Deep Vein Thrombosis, Hypotension, Myocardial Infarction, Peripheral Arterial Disease, Peripheral Venous Disease, Phlebitis, Vasculitis Vanzee, Lorrie P. ([id number]) Gastrointestinal Denies history of Cirrhosis , Colitis, Crohn s, Hepatitis A, Hepatitis B, Hepatitis C Endocrine Denies history of Type I Diabetes Genitourinary Denies history of End Stage Renal Disease Immunological Denies history of Lupus Erythematosus, Raynaud s, Scleroderma Integumentary (Skin) Denies history of History of Burn, History of pressure wounds Musculoskeletal Denies history of Gout, Rheumatoid Arthritis,  Osteomyelitis Neurologic Denies history of Dementia, Neuropathy, Quadriplegia, Paraplegia, Seizure Disorder Psychiatric Denies history of Anorexia/bulimia, Confinement Anxiety Patient is treated with Oral Agents. Review of Systems (ROS) Constitutional Symptoms (General Health) Denies complaints or symptoms of Fatigue, Fever, Chills, Marked Weight Change. Eyes Complains or has symptoms of Glasses / Contacts - glasses. Denies complaints or symptoms of Dry Eyes, Vision Changes. Ear/Nose/Mouth/Throat Denies complaints or symptoms of Difficult clearing ears, Sinusitis. Hematologic/Lymphatic Denies complaints or symptoms of Bleeding / Clotting Disorders, Human Immunodeficiency  Virus. Respiratory Denies complaints or symptoms of Chronic or frequent coughs, Shortness of Breath. Cardiovascular Complains or has symptoms of LE edema. Denies complaints or symptoms of Chest pain. Gastrointestinal Denies complaints or symptoms of Frequent diarrhea, Nausea, Vomiting. Endocrine Denies complaints or symptoms of Hepatitis, Thyroid disease, Polydypsia (Excessive Thirst). Genitourinary Denies complaints or symptoms of Kidney failure/ Dialysis, Incontinence/dribbling. Immunological Denies complaints or symptoms of Hives, Itching. Integumentary (Skin) Complains or has symptoms of Wounds, Swelling. Denies complaints or symptoms of Bleeding or bruising tendency, Breakdown. Musculoskeletal Denies complaints or symptoms of Muscle Pain, Muscle Weakness. Neurologic Denies complaints or symptoms of Numbness/parasthesias, Focal/Weakness. Psychiatric Denies complaints or symptoms of Anxiety, Claustrophobia. ELLANORA, RAYBORN ([id number]) Objective Constitutional Vitals Time Taken: 12:43 PM, Height: 65 in, Source: Measured, Weight: 125 lbs, Source: Measured, BMI: 20.8, Temperature: 97.6 F, Pulse: 78 bpm, Respiratory Rate: 16 breaths/min, Blood Pressure: 158/64 mmHg. Respiratory respirations are even and unlabored. clear throughout. Cardiovascular s1 s2 regular rate and rhythm. LLE- non-palpable, audible doppler. LLE- pitting edema, hyperpigmentation, warm to touch. Musculoskeletal ambulated with walker assistance. Psychiatric calm, cooperative. Integumentary (Hair, Skin) Wound #4 status is Open. Original cause of wound was Gradually Appeared. The wound is located on the Left,Anterior Lower Leg. The wound measures 0.6cm length x 0.5cm width x 0.1cm depth; 0.236cm^2 area and 0.024cm^3 volume. There is no tunneling or undermining noted. There is a large amount of serous drainage noted. The wound margin is flat and intact. There is large (67-100%) pink granulation within the  wound bed. There is no necrotic tissue within the wound bed. The periwound skin appearance exhibited: Hemosiderin Staining. The periwound skin appearance did not exhibit: Callus, Crepitus, Excoriation, Induration, Rash, Scarring, Dry/Scaly, Maceration, Atrophie Blanche, Cyanosis, Ecchymosis, Mottled, Pallor, Rubor, Erythema. Periwound temperature was noted as No Abnormality. Wound #5 status is Open. Original cause of wound was Gradually Appeared. The wound is located on the Left,Medial Lower Leg. The wound measures 0.6cm length x 0.3cm width x 0.1cm depth; 0.141cm^2 area and 0.014cm^3 volume. There is no tunneling or undermining noted. There is a large amount of serous drainage noted. The wound margin is flat and intact. There is large (67-100%) pink granulation within the wound bed. There is no necrotic tissue within the wound bed. The periwound skin appearance exhibited: Hemosiderin Staining. The periwound skin appearance did not exhibit: Callus, Crepitus, Excoriation, Induration, Rash, Scarring, Dry/Scaly, Maceration, Atrophie Blanche, Cyanosis, Ecchymosis, Mottled, Pallor, Rubor, Erythema. Periwound temperature was noted as No Abnormality. The periwound has tenderness on palpation. 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 ([id number]) Wound #4 Left,Anterior Lower Leg: Cleanse wound with mild soap and water Wound #5 Left,Medial Lower Leg: Cleanse wound with mild soap and water Anesthetic (add to Medication List): Wound #4 Left,Anterior Lower Leg: Topical Lidocaine 4% cream applied to wound bed prior to debridement (In Clinic Only). Wound #5 Left,Medial Lower Leg: Topical Lidocaine 4% cream  applied to wound bed prior to debridement (In Clinic Only). Primary Wound Dressing: Wound #4 Left,Anterior Lower Leg: Mepitel One  Contact layer Wound #5 Left,Medial Lower Leg: Mepitel One Contact layer Secondary Dressing: Wound #4 Left,Anterior Lower Leg: Conform/Kerlix Wound #5 Left,Medial Lower Leg: Conform/Kerlix Dressing Change Frequency: Wound #4 Left,Anterior Lower Leg: Other: - Leave dressing in place until Nurse Visit on Thursday or Friday of this week. Wound #5 Left,Medial Lower Leg: Other: - Leave dressing in place until Nurse Visit on Thursday or Friday of this week. Follow-up Appointments: Wound #4 Left,Anterior Lower Leg: Return Appointment in 1 week. Nurse Visit as needed - Thursday or Friday Wound #5 Left,Medial Lower Leg: Return Appointment in 1 week. Nurse Visit as needed - Thursday or Friday Edema Control: Wound #4 Left,Anterior Lower Leg: Other: - Ace Wrap Wound #5 Left,Medial Lower Leg: Other: - Ace Wrap Electronic Signature(s) Signed: 07/12/2018 1:56:20 PM By: Lawanda Cousins Entered By: Lawanda Cousins on 07/12/2018 13:56:20 Bowmer, AYTKZSWF P. ([id number]) -------------------------------------------------------------------------------- ROS/PFSH Details Patient Name: [name] P. Date of Service: 07/12/2018 12:30 PM Medical Record Number: [id number] Patient Account Number: [id number] Date of Birth/Sex: September 14, 1926 (84 y.o. F) Treating RN: Montey Hora Primary Care Provider: Lucianne Lei Other Clinician: Referring Provider: Lucianne Lei Treating Provider/Extender: Cathie Olden in Treatment: 0 Information Obtained From Patient Wound History Do you currently have one or more open woundso Yes How many open wounds do you currently haveo 2 Approximately how long have you had your woundso 2 weeks How have you been treating your wound(s) until nowo bandaid Has your wound(s) ever healed and then re-openedo No Have you had any lab work done in the past montho Yes Who ordered the lab work doneo PCP Have you tested positive for an antibiotic resistant organism (MRSA, VRE)o  No Have you tested positive for osteomyelitis (bone infection)o No Have you had any tests for circulation on your legso Yes Who ordered the testo [facility] Where was the test doneo [facility] Have you had other problems associated with your woundso Swelling Constitutional Symptoms (General Health) Complaints and Symptoms: Negative for: Fatigue; Fever; Chills; Marked Weight Change Eyes Complaints and Symptoms: Positive for: Glasses / Contacts - glasses Negative for: Dry Eyes; Vision Changes Medical History: Negative for: Cataracts; Glaucoma; Optic Neuritis Ear/Nose/Mouth/Throat Complaints and Symptoms: Negative for: Difficult clearing ears; Sinusitis Medical History: Negative for: Chronic sinus problems/congestion; Middle ear problems Hematologic/Lymphatic Complaints and Symptoms: Negative for: Bleeding / Clotting Disorders; Human Immunodeficiency Virus Medical History: Positive for: Anemia Negative for: Hemophilia; Human Immunodeficiency Virus; Lymphedema; Sickle Cell Disease Davidian, Lonya P. ([id number]) Respiratory Complaints and Symptoms: Negative for: Chronic or frequent coughs; Shortness of Breath Medical History: Negative for: Aspiration; Asthma; Chronic Obstructive Pulmonary Disease (COPD); Pneumothorax; Sleep Apnea; Tuberculosis Cardiovascular Complaints and Symptoms: Positive for: LE edema Negative for: Chest pain Medical History: Positive for: Hypertension Negative for: Angina; Arrhythmia; Congestive Heart Failure; Coronary Artery Disease; Deep Vein Thrombosis; Hypotension; Myocardial Infarction; Peripheral Arterial Disease; Peripheral Venous Disease; Phlebitis; Vasculitis Gastrointestinal Complaints and Symptoms: Negative for: Frequent diarrhea; Nausea; Vomiting Medical History: Negative for: Cirrhosis ; Colitis; Crohnos; Hepatitis A; Hepatitis B; Hepatitis C Endocrine Complaints and Symptoms: Negative for: Hepatitis; Thyroid disease; Polydypsia (Excessive  Thirst) Medical History: Positive for: Type II Diabetes Negative for: Type I Diabetes Treated with: Oral agents Genitourinary Complaints and Symptoms: Negative for: Kidney failure/ Dialysis; Incontinence/dribbling Medical History: Negative for: End Stage Renal Disease Immunological Complaints and Symptoms: Negative for: Hives; Itching Medical History: Negative for: Lupus Erythematosus; Raynaudos; Scleroderma Integumentary (Skin) Complaints  and Symptoms: Positive for: Wounds; Swelling Negative for: Bleeding or bruising tendency; Breakdown Rossel, Naysha P. ([id number]) Medical History: Negative for: History of Burn; History of pressure wounds Musculoskeletal Complaints and Symptoms: Negative for: Muscle Pain; Muscle Weakness Medical History: Positive for: Osteoarthritis Negative for: Gout; Rheumatoid Arthritis; Osteomyelitis Neurologic Complaints and Symptoms: Negative for: Numbness/parasthesias; Focal/Weakness Medical History: Negative for: Dementia; Neuropathy; Quadriplegia; Paraplegia; Seizure Disorder Psychiatric Complaints and Symptoms: Negative for: Anxiety; Claustrophobia Medical History: Negative for: Anorexia/bulimia; Confinement Anxiety Oncologic Medical History: Negative for: Received Chemotherapy; Received Radiation Immunizations Pneumococcal Vaccine: Received Pneumococcal Vaccination: Yes Immunization Notes: up to date Implantable Devices Family and Social History Cancer: No; Diabetes: Yes - Mother,Siblings; Heart Disease: Yes - Paternal Grandparents; Hereditary Spherocytosis: No; Hypertension: Yes - Paternal Grandparents; Kidney Disease: No; Lung Disease: No; Seizures: No; Stroke: No; Thyroid Problems: No; Tuberculosis: No; Never smoker; Marital Status - Separated; Alcohol Use: Never; Drug Use: No History; Caffeine Use: Rarely; Financial Concerns: No; Food, Clothing or Shelter Needs: No; Support System Lacking: No; Transportation Concerns: No;  Advanced Directives: No; Patient does not want information on Advanced Directives Electronic Signature(s) Signed: 07/12/2018 4:48:53 PM By: Montey Hora Signed: 07/12/2018 5:29:48 PM By: Lawanda Cousins Entered By: Montey Hora on 07/12/2018 12:50:15 [employer], [name] ([id number]) -------------------------------------------------------------------------------- SuperBill Details Patient Name: [name] P. Date of Service: 07/12/2018 Medical Record Number: [id number] Patient Account Number: [id number] Date of Birth/Sex: [birth year] (84 y.o. 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

## 2018-07-30 NOTE — Progress Notes (Signed)
Alexa, [name] ([id number]) Visit Report for 07/22/2018 Arrival Information Details Patient Name: [name], [name] Date of Service: 07/22/2018 11:15 AM Medical Record Number: [id number] Patient Account Number: [id number] Date of Birth/Sex: September 09, 1926 (84 y.o. F) Treating RN: Ahmed Prima Primary Care Tameyah Koch: Lucianne Lei Other Clinician: Referring Delanie Tirrell: Lucianne Lei Treating Aprile Dickenson/Extender: Melburn Hake, HOYT Weeks in Treatment: 1 Visit Information History Since Last Visit All ordered tests and consults were completed: No Patient Arrived: [name] Added or deleted any medications: No Arrival Time: 11:27 Any new allergies or adverse reactions: No Accompanied By: son Had a fall or experienced change in No Transfer Assistance: EasyPivot Patient activities of daily living that may affect Lift risk of falls: Patient Identification Verified: Yes Signs or symptoms of abuse/neglect since last visito No Secondary Verification Process Yes Hospitalized since last visit: No Completed: Implantable device outside of the clinic excluding No Patient Requires Transmission-Based No cellular tissue based products placed in the center Precautions: since last visit: Patient Has Alerts: Yes Has Dressing in Place as Prescribed: Yes Patient Alerts: ABI 03/22/18 R .68 Has Compression in Place as Prescribed: No L .88 Pain Present Now: No Electronic Signature(s) Signed: 07/22/2018 3:54:45 PM By: Alric Quan Entered By: Alric Quan on 07/22/2018 11:28:58 [name], [name] ([id number]) -------------------------------------------------------------------------------- Clinic Level of Care Assessment Details Patient Name: Alexa Lowes P. Date of Service: 07/22/2018 11:15 AM Medical Record Number: [id number] Patient Account Number: [id number] Date of Birth/Sex: 1926/10/24 (84 y.o. F) Treating RN: Montey Hora Primary Care Margaret Staggs: Lucianne Lei Other Clinician: Referring  Alaila Pillard: Lucianne Lei Treating Deerica Waszak/Extender: Melburn Hake, HOYT Weeks in Treatment: 1 Clinic Level of Care Assessment Items TOOL 4 Quantity Score []  - Use when only an EandM is performed on FOLLOW-UP visit 0 ASSESSMENTS - Nursing Assessment / Reassessment X - Reassessment of Co-morbidities (includes updates in patient status) 1 10 X- 1 5 Reassessment of Adherence to Treatment Plan ASSESSMENTS - Wound and Skin Assessment / Reassessment X - Simple Wound Assessment / Reassessment - one wound 1 5 []  - 0 Complex Wound Assessment / Reassessment - multiple wounds []  - 0 Dermatologic / Skin Assessment (not related to wound area) ASSESSMENTS - Focused Assessment X - Circumferential Edema Measurements - multi extremities 1 5 []  - 0 Nutritional Assessment / Counseling / Intervention X- 1 5 Lower Extremity Assessment (monofilament, tuning fork, pulses) []  - 0 Peripheral Arterial Disease Assessment (using hand held doppler) ASSESSMENTS - Ostomy and/or Continence Assessment and Care []  - Incontinence Assessment and Management 0 []  - 0 Ostomy Care Assessment and Management (repouching, etc.) PROCESS - Coordination of Care X - Simple Patient / Family Education for ongoing care 1 15 []  - 0 Complex (extensive) Patient / Family Education for ongoing care []  - 0 Staff obtains Programmer, systems, Records, Test Results / Process Orders []  - 0 Staff telephones HHA, Nursing Homes / Clarify orders / etc []  - 0 Routine Transfer to another Facility (non-emergent condition) []  - 0 Routine Hospital Admission (non-emergent condition) []  - 0 New Admissions / Biomedical engineer / Ordering NPWT, Apligraf, etc. []  - 0 Emergency Hospital Admission (emergent condition) X- 1 10 Simple Discharge Coordination [name], [name] ([id number]) []  - 0 Complex (extensive) Discharge Coordination PROCESS - Special Needs []  - Pediatric / Minor Patient Management 0 []  - 0 Isolation Patient Management []  -  0 Hearing / Language / Visual special needs []  - 0 Assessment of Community assistance (transportation, D/C planning, etc.) []  - 0 Additional assistance / Altered mentation []  - 0 Support Surface(s)  Assessment (bed, cushion, seat, etc.) INTERVENTIONS - Wound Cleansing / Measurement X - Simple Wound Cleansing - one wound 1 5 []  - 0 Complex Wound Cleansing - multiple wounds X- 1 5 Wound Imaging (photographs - any number of wounds) []  - 0 Wound Tracing (instead of photographs) X- 1 5 Simple Wound Measurement - one wound []  - 0 Complex Wound Measurement - multiple wounds INTERVENTIONS - Wound Dressings []  - Small Wound Dressing one or multiple wounds 0 []  - 0 Medium Wound Dressing one or multiple wounds []  - 0 Large Wound Dressing one or multiple wounds []  - 0 Application of Medications - topical []  - 0 Application of Medications - injection INTERVENTIONS - Miscellaneous []  - External ear exam 0 []  - 0 Specimen Collection (cultures, biopsies, blood, body fluids, etc.) []  - 0 Specimen(s) / Culture(s) sent or taken to Lab for analysis []  - 0 Patient Transfer (multiple staff / Civil Service fast streamer / Similar devices) []  - 0 Simple Staple / Suture removal (25 or less) []  - 0 Complex Staple / Suture removal (26 or more) []  - 0 Hypo / Hyperglycemic Management (close monitor of Blood Glucose) []  - 0 Ankle / Brachial Index (ABI) - do not check if billed separately X- 1 5 Vital Signs [name], [name] ([id number]) Has the patient been seen at the hospital within the last three years: Yes Total Score: 75 Level Of Care: New/Established - Level 2 Electronic Signature(s) Signed: 07/22/2018 4:18:40 PM By: Montey Hora Entered By: Montey Hora on 07/22/2018 13:08:59 [name], [name] ([id number]) -------------------------------------------------------------------------------- Encounter Discharge Information Details Patient Name: Alexa Lowes P. Date of Service: 07/22/2018 11:15  AM Medical Record Number: [id number] Patient Account Number: [id number] Date of Birth/Sex: 04/17/1926 (84 y.o. F) Treating RN: Montey Hora Primary Care Jillyan Plitt: Lucianne Lei Other Clinician: Referring Leatta Alewine: Lucianne Lei Treating Marialy Urbanczyk/Extender: Melburn Hake, HOYT Weeks in Treatment: 1 Encounter Discharge Information Items Discharge Condition: Stable Ambulatory Status: Ambulatory Discharge Destination: Home Transportation: Private Auto Accompanied By: self Schedule Follow-up Appointment: No Clinical Summary of Care: Electronic Signature(s) Signed: 07/22/2018 1:09:24 PM By: Montey Hora Entered By: Montey Hora on 07/22/2018 13:09:24 Mehlman, ZSWFUXNA P. ([id number]) -------------------------------------------------------------------------------- Lower Extremity Assessment Details Patient Name: Alexa Lowes P. Date of Service: 07/22/2018 11:15 AM Medical Record Number: [id number] Patient Account Number: [id number] Date of Birth/Sex: 1926-07-10 (84 y.o. F) Treating RN: Ahmed Prima Primary Care Melanee Cordial: Lucianne Lei Other Clinician: Referring Irem Stoneham: Lucianne Lei Treating Kashawn Manzano/Extender: Melburn Hake, HOYT Weeks in Treatment: 1 Edema Assessment Assessed: [Left: No] [Right: No] [Left: Edema] [Right: :] Calf Left: Right: Point of Measurement: 36 cm From Medial Instep 31.4 cm 28 cm Ankle Left: Right: Point of Measurement: 12 cm From Medial Instep 20.1 cm 18.4 cm Vascular Assessment Pulses: Dorsalis Pedis Palpable: [Left:Yes] [Right:Yes] Posterior Tibial Extremity colors, hair growth, and conditions: Extremity Color: [Left:Hyperpigmented] [Right:Normal] Temperature of Extremity: [Left:Warm] [Right:Cool] Capillary Refill: [Left:< 3 seconds] [Right:< 3 seconds] Toe Nail Assessment Left: Right: Thick: No No Discolored: No No Deformed: No No Improper Length and Hygiene: No No Electronic Signature(s) Signed: 07/22/2018 3:54:45 PM By: Alric Quan Entered  By: Alric Quan on 07/22/2018 11:35:14 [name], [name] ([id number]) -------------------------------------------------------------------------------- Multi-Disciplinary Care Plan Details Patient Name: Alexa Lowes P. Date of Service: 07/22/2018 11:15 AM Medical Record Number: [id number] Patient Account Number: [id number] Date of Birth/Sex: 1926/06/04 (84 y.o. F) Treating RN: Montey Hora Primary Care Bowen Goyal: Lucianne Lei Other Clinician: Referring Trevone Prestwood: Lucianne Lei Treating Maitlyn Penza/Extender: Melburn Hake, HOYT Weeks in Treatment: 1 Active Inactive Electronic Signature(s) Signed: 07/22/2018 4:18:40  PM By: Montey Hora Entered By: Montey Hora on 07/22/2018 12:08:37 [name], [name] ([id number]) -------------------------------------------------------------------------------- Pain Assessment Details Patient Name: Alexa Lowes P. Date of Service: 07/22/2018 11:15 AM Medical Record Number: [id number] Patient Account Number: [id number] Date of Birth/Sex: January 16, 1926 (84 y.o. F) Treating RN: Ahmed Prima Primary Care Chaysen Tillman: Lucianne Lei Other Clinician: Referring Robby Bulkley: Lucianne Lei Treating Brooklyn Jeff/Extender: Melburn Hake, HOYT Weeks in Treatment: 1 Active Problems Location of Pain Severity and Description of Pain Patient Has Paino No Site Locations Pain Management and Medication Current Pain Management: Electronic Signature(s) Signed: 07/22/2018 3:54:45 PM By: Alric Quan Entered By: Alric Quan on 07/22/2018 11:29:03 [name], [name] ([id number]) -------------------------------------------------------------------------------- Patient/Caregiver Education Details Patient Name: Alexa Lowes P. Date of Service: 07/22/2018 11:15 AM Medical Record Number: [id number] Patient Account Number: [id number] Date of Birth/Gender: [birth year] (84 y.o. F) Treating RN: Montey Hora Primary Care Physician: Lucianne Lei Other Clinician: Referring  Physician: Lucianne Lei Treating Physician/Extender: Sharalyn Ink in Treatment: 1 Education Assessment Education Provided To: Patient Education Topics Provided Basic Hygiene: Handouts: Other: care of newly healed ulcer site Methods: Explain/Verbal Responses: State content correctly Electronic Signature(s) Signed: 07/22/2018 4:18:40 PM By: Montey Hora Entered By: Montey Hora on 07/22/2018 13:09:50 [name], [name] ([id number]) -------------------------------------------------------------------------------- Wound Assessment Details Patient Name: Alexa Lowes P. Date of Service: 07/22/2018 11:15 AM Medical Record Number: [id number] Patient Account Number: [id number] Date of Birth/Sex: 12/27/1925 (84 y.o. F) Treating RN: Montey Hora Primary Care Makennah Omura: Lucianne Lei Other Clinician: Referring Milee Qualls: Lucianne Lei Treating Sherra Kimmons/Extender: Melburn Hake, HOYT Weeks in Treatment: 1 Wound Status Wound Number: 4 Primary Venous Leg Ulcer Etiology: Wound Location: Left, Anterior Lower Leg Wound Status: Healed - Epithelialized Wounding Event: Gradually Appeared Comorbid Anemia, Hypertension, Type II Diabetes, Date Acquired: 06/28/2018 History: Osteoarthritis Weeks Of Treatment: 1 Clustered Wound: No Photos Photo Uploaded By: Alric Quan on 07/22/2018 11:48:12 Wound Measurements Length: (cm) 0 % Reduct Width: (cm) 0 % Reduct Depth: (cm) 0 Epitheli Area: (cm) 0 Tunneli Volume: (cm) 0 Undermi ion in Area: 100% ion in Volume: 100% alization: Large (67-100%) ng: No ning: No Wound Description Classification: Partial Thickness Wound Margin: Flat and Intact Exudate Amount: None Present Foul Odor After Cleansing: No Slough/Fibrino No Wound Bed Granulation Amount: None Present (0%) Exposed Structure Necrotic Amount: None Present (0%) Fascia Exposed: No Fat Layer (Subcutaneous Tissue) Exposed: No Tendon Exposed: No Muscle Exposed: No Joint  Exposed: No Bone Exposed: No Periwound Skin Texture Texture Color No Abnormalities Noted: No No Abnormalities Noted: No [name], [name] ([id number]) Callus: No Atrophie Blanche: No Crepitus: No Cyanosis: No Excoriation: No Ecchymosis: No Induration: No Erythema: No Rash: No Hemosiderin Staining: Yes Scarring: No Mottled: No Pallor: No Moisture Rubor: No No Abnormalities Noted: No Dry / Scaly: No Temperature / Pain Maceration: No Temperature: No Abnormality Wound Preparation Ulcer Cleansing: Rinsed/Irrigated with Saline Topical Anesthetic Applied: None Electronic Signature(s) Signed: 07/22/2018 4:18:40 PM By: Montey Hora Entered By: Montey Hora on 07/22/2018 12:08:20 Favata, SAYTKZSW P. ([id number]) -------------------------------------------------------------------------------- Wound Assessment Details Patient Name: Alexa Lowes P. Date of Service: 07/22/2018 11:15 AM Medical Record Number: [id number] Patient Account Number: [id number] Date of Birth/Sex: [birth year] (84 y.o. F) Treating RN: Montey Hora Primary Care Carolos Fecher: Lucianne Lei Other Clinician: Referring Kamara Allan: Lucianne Lei Treating Tyrone Pautsch/Extender: Melburn Hake, HOYT Weeks in Treatment: 1 Wound Status Wound Number: 5 Primary Venous Leg Ulcer Etiology: Wound Location: Left, Medial Lower Leg Wound Status: Healed - Epithelialized Wounding Event: Gradually Appeared Comorbid Anemia, Hypertension, Type II Diabetes, Date Acquired: 06/28/2018 History: Osteoarthritis Weeks  Of Treatment: 1 Clustered Wound: No Photos Photo Uploaded By: Alric Quan on 07/22/2018 11:48:13 Wound Measurements Length: (cm) 0 % Reduct Width: (cm) 0 % Reduct Depth: (cm) 0 Epitheli Area: (cm) 0 Tunneli Volume: (cm) 0 Undermi ion in Area: 100% ion in Volume: 100% alization: Large (67-100%) ng: No ning: No Wound Description Classification: Partial Thickness Wound Margin: Flat and Intact Exudate  Amount: None Present Foul Odor After Cleansing: No Slough/Fibrino No Wound Bed Granulation Amount: None Present (0%) Exposed Structure Necrotic Amount: None Present (0%) Fascia Exposed: No Fat Layer (Subcutaneous Tissue) Exposed: No Tendon Exposed: No Muscle Exposed: No Joint Exposed: No Bone Exposed: No Periwound Skin Texture Texture Color No Abnormalities Noted: No No Abnormalities Noted: No Patchell, Ayana P. ([id number]) Callus: No Atrophie Blanche: No Crepitus: No Cyanosis: No Excoriation: No Ecchymosis: No Induration: No Erythema: No Rash: No Hemosiderin Staining: No Scarring: No Mottled: No Pallor: No Moisture Rubor: No No Abnormalities Noted: No Dry / Scaly: No Temperature / Pain Maceration: No Temperature: No Abnormality Tenderness on Palpation: Yes Wound Preparation Ulcer Cleansing: Rinsed/Irrigated with Saline Topical Anesthetic Applied: None Electronic Signature(s) Signed: 07/22/2018 4:18:40 PM By: Montey Hora Entered By: Montey Hora on 07/22/2018 12:08:21 Krawczyk, QMGNOIBB P. ([id number]) -------------------------------------------------------------------------------- Vitals Details Patient Name: Alexa Lowes P. Date of Service: 07/22/2018 11:15 AM Medical Record Number: [id number] Patient Account Number: [id number] Date of Birth/Sex: May 09, 1926 (84 y.o. F) Treating RN: Ahmed Prima Primary Care Margie Urbanowicz: Lucianne Lei Other Clinician: Referring Iline Buchinger: Lucianne Lei Treating Jacquese Hackman/Extender: Melburn Hake, HOYT Weeks in Treatment: 1 Vital Signs Time Taken: 11:31 Temperature (F): 97.9 Height (in): 65 Pulse (bpm): 85 Weight (lbs): 125 Respiratory Rate (breaths/min): 16 Body Mass Index (BMI): 20.8 Blood Pressure (mmHg): 163/66 Reference Range: 80 - 120 mg / dl Electronic Signature(s) Signed: 07/22/2018 3:54:45 PM By: Alric Quan Entered By: Alric Quan on 07/22/2018 11:31:24

## 2018-08-25 ENCOUNTER — Inpatient Hospital Stay: Payer: Medicare Other | Attending: Oncology

## 2018-08-25 ENCOUNTER — Inpatient Hospital Stay (HOSPITAL_BASED_OUTPATIENT_CLINIC_OR_DEPARTMENT_OTHER): Payer: Medicare Other | Admitting: Oncology

## 2018-08-25 ENCOUNTER — Telehealth: Payer: Self-pay | Admitting: Oncology

## 2018-08-25 VITALS — BP 157/69 | HR 85 | Temp 97.6°F | Resp 18 | Ht 66.5 in | Wt 129.9 lb

## 2018-08-25 DIAGNOSIS — D649 Anemia, unspecified: Secondary | ICD-10-CM

## 2018-08-25 DIAGNOSIS — R1012 Left upper quadrant pain: Secondary | ICD-10-CM

## 2018-08-25 DIAGNOSIS — E1122 Type 2 diabetes mellitus with diabetic chronic kidney disease: Secondary | ICD-10-CM

## 2018-08-25 DIAGNOSIS — N189 Chronic kidney disease, unspecified: Secondary | ICD-10-CM | POA: Insufficient documentation

## 2018-08-25 DIAGNOSIS — I129 Hypertensive chronic kidney disease with stage 1 through stage 4 chronic kidney disease, or unspecified chronic kidney disease: Secondary | ICD-10-CM

## 2018-08-25 DIAGNOSIS — D631 Anemia in chronic kidney disease: Secondary | ICD-10-CM | POA: Insufficient documentation

## 2018-08-25 LAB — CBC WITH DIFFERENTIAL (CANCER CENTER ONLY)
BASOS ABS: 0 10*3/uL (ref 0.0–0.1)
Basophils Relative: 0 %
Eosinophils Absolute: 0.2 10*3/uL (ref 0.0–0.5)
Eosinophils Relative: 3 %
HEMATOCRIT: 24.6 % — AB (ref 34.8–46.6)
Hemoglobin: 8 g/dL — ABNORMAL LOW (ref 11.6–15.9)
LYMPHS PCT: 29 %
Lymphs Abs: 1.4 10*3/uL (ref 0.9–3.3)
MCH: 27.3 pg (ref 25.1–34.0)
MCHC: 32.6 g/dL (ref 31.5–36.0)
MCV: 83.8 fL (ref 79.5–101.0)
MONO ABS: 0.7 10*3/uL (ref 0.1–0.9)
MONOS PCT: 14 %
NEUTROS ABS: 2.6 10*3/uL (ref 1.5–6.5)
Neutrophils Relative %: 54 %
Platelet Count: 260 10*3/uL (ref 145–400)
RBC: 2.94 MIL/uL — ABNORMAL LOW (ref 3.70–5.45)
RDW: 19.5 % — AB (ref 11.2–14.5)
WBC Count: 4.9 10*3/uL (ref 3.9–10.3)

## 2018-08-25 NOTE — Telephone Encounter (Signed)
Gave pt avs and calendar  °

## 2018-08-25 NOTE — Progress Notes (Signed)
Hematology and Oncology Follow Up Visit  [name] [id number] 06-08-1926 84 y.o. 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   Prior Therapy:  She is status post packed red cell transfusion in the past  Current therapy: Under evaluation start growth factor support.  Interim History: [name] presents today for a follow-up.  Since her last visit, she reports no major changes in her health.  She has reported left upper quadrant discomfort at times without any nausea or vomiting.  She does report some mild fatigue and tiredness but no hematochezia or melena.  Her appetite and performance status remains unchanged.   He does not report any headaches, blurry vision, syncope or seizures. Does not report any fevers, chills or sweats.  Does not report any cough, wheezing or hemoptysis.  Does not report any chest pain, palpitation, orthopnea or leg edema.  Does not report any nausea, vomiting or abdominal pain.  Does not report any constipation or diarrhea.  Does not report any skeletal complaints.    Does not report frequency, urgency or hematuria.  Does not report any skin rashes or lesions. Does not report any heat or cold intolerance.  Does not report any lymphadenopathy or petechiae.  Does not report any anxiety or depression.  Remaining review of systems is negative.    Medications: I have reviewed the patient's current medications.  Current Outpatient Medications  Medication Sig Dispense Refill  . ACCU-CHEK FASTCLIX LANCETS MISC TEST TID  1  . ACCU-CHEK GUIDE test strip     . amLODipine (NORVASC) 10 MG tablet Take 10 mg by mouth daily.    Marland Kitchen aspirin EC 81 MG tablet Take 81 mg by mouth daily.    Marland Kitchen atorvastatin (LIPITOR) 10 MG tablet Take 10 mg by mouth daily.    . Blood Glucose Monitoring Suppl (ACCU-CHEK GUIDE) w/Device KIT See admin instructions.  0   . Calcium Carbonate-Vitamin D (CALCIUM 500 + D) 500-125 MG-UNIT TABS Take by mouth.    . Calcium-Magnesium-Zinc (CAL-MAG-ZINC PO) Take 1 tablet by mouth daily.    . carvedilol (COREG) 6.25 MG tablet Take 6.25 mg by mouth at bedtime.    . cephALEXin (KEFLEX) 250 MG capsule TK 1 C PO QID  0  . Cholecalciferol (VITAMIN D3 GUMMIES ADULT PO) Take 1 tablet by mouth daily.    . citalopram (CELEXA) 10 MG tablet Take 1 tablet by mouth daily.  2  . clopidogrel (PLAVIX) 75 MG tablet Take 1 tablet (75 mg total) by mouth daily with breakfast. 30 tablet 1  . doxycycline (VIBRA-TABS) 100 MG tablet Take 1 tablet (100 mg total) by mouth every 12 (twelve) hours. 20 tablet 0  . metFORMIN (GLUCOPHAGE) 1000 MG tablet Take 1,000 mg by mouth 2 (two) times daily with a meal.    . pantoprazole (PROTONIX) 20 MG tablet Take 1 tablet (20 mg total) by mouth daily. 30 tablet 1  . polyethylene glycol (MIRALAX / GLYCOLAX) packet Take 17 g by mouth daily as needed (for constipation.).     Marland Kitchen SANTYL ointment Apply 1 application topically daily. Applied to wound of leg and dress wound  0  . valsartan-hydrochlorothiazide (DIOVAN-HCT) 320-25 MG tablet Take by mouth.     No current facility-administered medications for this visit.      Allergies: No Known Allergies  Past Medical History, Surgical history, Social history, and Family History were  reviewed and updated.    Physical Exam: Blood pressure (!) 157/69, pulse 85, temperature 97.6 F (36.4 C), temperature source Oral, resp. rate 18, height 5' 6.5" (1.689 m), weight 129 lb 14.4 oz (58.9 kg), SpO2 100 %.   ECOG: 1 General appearance: alert and cooperative appeared without distress. Head: Normocephalic, without obvious abnormality Oropharynx: No oral thrush or ulcers. Eyes: No scleral icterus.  Pupils are equal and round reactive to light. Lymph nodes: Cervical, supraclavicular, and axillary nodes normal. Heart:regular rate and rhythm, S1, S2 normal, no murmur,  click, rub or gallop Lung:chest clear, no wheezing, rales, normal symmetric air entry Abdomin: soft, non-tender, without masses or organomegaly.  No shifting dullness or ascites. Neurological: No motor, sensory deficits.  Intact deep tendon reflexes. Skin: No rashes or lesions.  No ecchymosis or petechiae. Musculoskeletal: No joint deformity or effusion. Psychiatric: Mood and affect are appropriate.    Lab Results: Lab Results  Component Value Date   WBC 4.9 08/25/2018   HGB 8.0 (L) 08/25/2018   HCT 24.6 (L) 08/25/2018   MCV 83.8 08/25/2018   PLT 260 08/25/2018     Chemistry      Component Value Date/Time   NA 140 05/25/2018 1135   K 3.9 05/25/2018 1135   CL 105 05/25/2018 1135   CO2 24 05/25/2018 1135   BUN 33 (H) 05/25/2018 1135   CREATININE 2.24 (H) 05/25/2018 1135      Component Value Date/Time   CALCIUM 8.5 (L) 05/25/2018 1135   ALKPHOS 63 05/25/2018 1135   AST 59 (H) 05/25/2018 1135   ALT 33 05/25/2018 1135   BILITOT 0.3 05/25/2018 1135        Impression and Plan:  84 year old woman with  1.    Multifactorial anemia: Her work-up did not reveal any evidence of plasma cell disorder with normal iron and ferritin levels.  Her serum protein electrophoresis was also within normal range.  The differential diagnosis was reviewed again and management options were discussed.  Growth factor support in the form of Aranesp or Procrit would be reasonable given her renal insufficiency.  Continue to monitor her iron studies periodically and replace as needed.  Complication associated with Aranesp was reviewed today.  His were include hypertension and thromboembolism.  She will receive 300 mcg every 3 weeks to keep her hemoglobin above 10.  She is agreeable to proceed.  2.  Renal insufficiency: Related to long-standing hypertension and diabetes.  No evidence of plasma cell disorder.  3.  Left upper quadrant pain: I will obtain abdominal ultrasound to rule out splenomegaly as  the cause of her abdominal discomfort.  Splenic megaly could be a sign of a myeloproliferative disorder contributing to her anemia.  4.  Follow-up: We will be in 3 months.   15  minutes was spent with the patient face-to-face today.  More than 50% of time was dedicated to reviewing the differential diagnosis, treatment options and managing plan of care.    Zola Button, MD 9/26/201910:34 AM

## 2018-08-26 ENCOUNTER — Ambulatory Visit (HOSPITAL_COMMUNITY)
Admission: RE | Admit: 2018-08-26 | Discharge: 2018-08-26 | Disposition: A | Discharge status: Home / Self Care | Payer: Medicare Other | Source: Ambulatory Visit | Attending: Oncology | Admitting: Oncology

## 2018-08-26 DIAGNOSIS — R1012 Left upper quadrant pain: Secondary | ICD-10-CM | POA: Insufficient documentation

## 2018-08-26 DIAGNOSIS — D649 Anemia, unspecified: Secondary | ICD-10-CM | POA: Diagnosis not present

## 2018-08-30 DIAGNOSIS — I249 Acute ischemic heart disease, unspecified: Secondary | ICD-10-CM

## 2018-08-30 HISTORY — DX: Acute ischemic heart disease, unspecified: I24.9

## 2018-08-31 ENCOUNTER — Inpatient Hospital Stay: Payer: Medicare Other | Attending: Oncology

## 2018-08-31 VITALS — BP 155/68 | HR 72

## 2018-08-31 DIAGNOSIS — N189 Chronic kidney disease, unspecified: Secondary | ICD-10-CM | POA: Diagnosis not present

## 2018-08-31 DIAGNOSIS — D631 Anemia in chronic kidney disease: Secondary | ICD-10-CM | POA: Diagnosis not present

## 2018-08-31 MED ORDER — DARBEPOETIN ALFA 300 MCG/0.6ML IJ SOSY
300.0000 ug | PREFILLED_SYRINGE | Freq: Once | INTRAMUSCULAR | Status: AC
  Administered 2018-08-31: 300 ug via SUBCUTANEOUS

## 2018-08-31 NOTE — Patient Instructions (Signed)

## 2018-09-06 DIAGNOSIS — J069 Acute upper respiratory infection, unspecified: Secondary | ICD-10-CM | POA: Diagnosis not present

## 2018-09-07 ENCOUNTER — Other Ambulatory Visit: Payer: Self-pay

## 2018-09-07 NOTE — Patient Outreach (Signed)
[facility]) Care Management  09/07/2018  [name] 28-Aug-1926 [id number]   Medication Adherence call to [name] patient was not feeling good and wants a call back patient is due on Metformin 1000 mg under [facility].   [place] Management Direct Dial [phone]  Fax [phone] [name]

## 2018-09-09 ENCOUNTER — Other Ambulatory Visit: Payer: Self-pay

## 2018-09-09 ENCOUNTER — Emergency Department (HOSPITAL_COMMUNITY): Payer: Medicare Other

## 2018-09-09 ENCOUNTER — Inpatient Hospital Stay (HOSPITAL_COMMUNITY): Payer: Medicare Other

## 2018-09-09 ENCOUNTER — Encounter (HOSPITAL_COMMUNITY): Payer: Self-pay | Admitting: *Deleted

## 2018-09-09 ENCOUNTER — Inpatient Hospital Stay (HOSPITAL_COMMUNITY)
Admission: EM | Admit: 2018-09-09 | Discharge: 2018-09-13 | DRG: 291 | Disposition: A | Discharge status: Home Health | Payer: Medicare Other | Attending: Family Medicine | Admitting: Family Medicine

## 2018-09-09 DIAGNOSIS — Z8673 Personal history of transient ischemic attack (TIA), and cerebral infarction without residual deficits: Secondary | ICD-10-CM | POA: Diagnosis not present

## 2018-09-09 DIAGNOSIS — Z961 Presence of intraocular lens: Secondary | ICD-10-CM | POA: Diagnosis present

## 2018-09-09 DIAGNOSIS — E1151 Type 2 diabetes mellitus with diabetic peripheral angiopathy without gangrene: Secondary | ICD-10-CM | POA: Diagnosis present

## 2018-09-09 DIAGNOSIS — I509 Heart failure, unspecified: Secondary | ICD-10-CM | POA: Diagnosis not present

## 2018-09-09 DIAGNOSIS — J811 Chronic pulmonary edema: Secondary | ICD-10-CM | POA: Diagnosis not present

## 2018-09-09 DIAGNOSIS — Z9841 Cataract extraction status, right eye: Secondary | ICD-10-CM

## 2018-09-09 DIAGNOSIS — I739 Peripheral vascular disease, unspecified: Secondary | ICD-10-CM | POA: Diagnosis present

## 2018-09-09 DIAGNOSIS — Z66 Do not resuscitate: Secondary | ICD-10-CM | POA: Diagnosis not present

## 2018-09-09 DIAGNOSIS — F0390 Unspecified dementia without behavioral disturbance: Secondary | ICD-10-CM | POA: Diagnosis present

## 2018-09-09 DIAGNOSIS — N184 Chronic kidney disease, stage 4 (severe): Secondary | ICD-10-CM | POA: Diagnosis present

## 2018-09-09 DIAGNOSIS — Z7982 Long term (current) use of aspirin: Secondary | ICD-10-CM

## 2018-09-09 DIAGNOSIS — Z89422 Acquired absence of other left toe(s): Secondary | ICD-10-CM

## 2018-09-09 DIAGNOSIS — I340 Nonrheumatic mitral (valve) insufficiency: Secondary | ICD-10-CM | POA: Diagnosis not present

## 2018-09-09 DIAGNOSIS — D631 Anemia in chronic kidney disease: Secondary | ICD-10-CM | POA: Diagnosis present

## 2018-09-09 DIAGNOSIS — E785 Hyperlipidemia, unspecified: Secondary | ICD-10-CM | POA: Diagnosis not present

## 2018-09-09 DIAGNOSIS — J9601 Acute respiratory failure with hypoxia: Secondary | ICD-10-CM | POA: Diagnosis present

## 2018-09-09 DIAGNOSIS — N179 Acute kidney failure, unspecified: Secondary | ICD-10-CM | POA: Diagnosis present

## 2018-09-09 DIAGNOSIS — Z7984 Long term (current) use of oral hypoglycemic drugs: Secondary | ICD-10-CM

## 2018-09-09 DIAGNOSIS — Z515 Encounter for palliative care: Secondary | ICD-10-CM | POA: Diagnosis not present

## 2018-09-09 DIAGNOSIS — F329 Major depressive disorder, single episode, unspecified: Secondary | ICD-10-CM | POA: Diagnosis present

## 2018-09-09 DIAGNOSIS — E1122 Type 2 diabetes mellitus with diabetic chronic kidney disease: Secondary | ICD-10-CM | POA: Diagnosis not present

## 2018-09-09 DIAGNOSIS — I248 Other forms of acute ischemic heart disease: Secondary | ICD-10-CM | POA: Diagnosis not present

## 2018-09-09 DIAGNOSIS — I5021 Acute systolic (congestive) heart failure: Secondary | ICD-10-CM | POA: Diagnosis not present

## 2018-09-09 DIAGNOSIS — I5043 Acute on chronic combined systolic (congestive) and diastolic (congestive) heart failure: Secondary | ICD-10-CM | POA: Diagnosis not present

## 2018-09-09 DIAGNOSIS — I249 Acute ischemic heart disease, unspecified: Secondary | ICD-10-CM | POA: Diagnosis present

## 2018-09-09 DIAGNOSIS — N189 Chronic kidney disease, unspecified: Secondary | ICD-10-CM | POA: Diagnosis not present

## 2018-09-09 DIAGNOSIS — Z7902 Long term (current) use of antithrombotics/antiplatelets: Secondary | ICD-10-CM

## 2018-09-09 DIAGNOSIS — Z79899 Other long term (current) drug therapy: Secondary | ICD-10-CM

## 2018-09-09 DIAGNOSIS — J90 Pleural effusion, not elsewhere classified: Secondary | ICD-10-CM | POA: Diagnosis not present

## 2018-09-09 DIAGNOSIS — K5900 Constipation, unspecified: Secondary | ICD-10-CM | POA: Diagnosis not present

## 2018-09-09 DIAGNOSIS — K219 Gastro-esophageal reflux disease without esophagitis: Secondary | ICD-10-CM | POA: Diagnosis not present

## 2018-09-09 DIAGNOSIS — Z9842 Cataract extraction status, left eye: Secondary | ICD-10-CM

## 2018-09-09 DIAGNOSIS — R0600 Dyspnea, unspecified: Secondary | ICD-10-CM | POA: Diagnosis not present

## 2018-09-09 DIAGNOSIS — R531 Weakness: Secondary | ICD-10-CM

## 2018-09-09 DIAGNOSIS — I10 Essential (primary) hypertension: Secondary | ICD-10-CM | POA: Diagnosis not present

## 2018-09-09 DIAGNOSIS — R7989 Other specified abnormal findings of blood chemistry: Secondary | ICD-10-CM

## 2018-09-09 DIAGNOSIS — Z23 Encounter for immunization: Secondary | ICD-10-CM | POA: Diagnosis not present

## 2018-09-09 DIAGNOSIS — L97229 Non-pressure chronic ulcer of left calf with unspecified severity: Secondary | ICD-10-CM | POA: Diagnosis present

## 2018-09-09 DIAGNOSIS — I2510 Atherosclerotic heart disease of native coronary artery without angina pectoris: Secondary | ICD-10-CM | POA: Diagnosis not present

## 2018-09-09 DIAGNOSIS — I130 Hypertensive heart and chronic kidney disease with heart failure and stage 1 through stage 4 chronic kidney disease, or unspecified chronic kidney disease: Secondary | ICD-10-CM | POA: Diagnosis not present

## 2018-09-09 DIAGNOSIS — R748 Abnormal levels of other serum enzymes: Secondary | ICD-10-CM | POA: Diagnosis not present

## 2018-09-09 DIAGNOSIS — R778 Other specified abnormalities of plasma proteins: Secondary | ICD-10-CM

## 2018-09-09 DIAGNOSIS — R0602 Shortness of breath: Secondary | ICD-10-CM | POA: Diagnosis not present

## 2018-09-09 DIAGNOSIS — E119 Type 2 diabetes mellitus without complications: Secondary | ICD-10-CM

## 2018-09-09 HISTORY — DX: Dyspnea, unspecified: R06.00

## 2018-09-09 HISTORY — DX: Acute ischemic heart disease, unspecified: I24.9

## 2018-09-09 LAB — D-DIMER, QUANTITATIVE: D-Dimer, Quant: 1.16 ug/mL-FEU — ABNORMAL HIGH (ref 0.00–0.50)

## 2018-09-09 LAB — BASIC METABOLIC PANEL
Anion gap: 11 (ref 5–15)
BUN: 23 mg/dL (ref 8–23)
CALCIUM: 9.4 mg/dL (ref 8.9–10.3)
CO2: 22 mmol/L (ref 22–32)
CREATININE: 2 mg/dL — AB (ref 0.44–1.00)
Chloride: 108 mmol/L (ref 98–111)
GFR, EST AFRICAN AMERICAN: 24 mL/min — AB (ref >60)
GFR, EST NON AFRICAN AMERICAN: 21 mL/min — AB (ref >60)
GLUCOSE: 117 mg/dL — AB (ref 70–99)
Potassium: 4.1 mmol/L (ref 3.5–5.1)
Sodium: 141 mmol/L (ref 135–145)

## 2018-09-09 LAB — HEMOGLOBIN A1C
Hgb A1c MFr Bld: 5.3 % (ref 4.8–5.6)
Mean Plasma Glucose: 105.41 mg/dL

## 2018-09-09 LAB — I-STAT TROPONIN, ED
TROPONIN I, POC: 0.11 ng/mL — AB (ref 0.00–0.08)
Troponin i, poc: 0.08 ng/mL (ref 0.00–0.08)

## 2018-09-09 LAB — CBC
HCT: 27.9 % — ABNORMAL LOW (ref 36.0–46.0)
Hemoglobin: 8.8 g/dL — ABNORMAL LOW (ref 12.0–15.0)
MCH: 26.3 pg (ref 26.0–34.0)
MCHC: 31.5 g/dL (ref 30.0–36.0)
MCV: 83.3 fL (ref 80.0–100.0)
PLATELETS: 329 10*3/uL (ref 150–400)
RBC: 3.35 MIL/uL — ABNORMAL LOW (ref 3.87–5.11)
RDW: 18.5 % — AB (ref 11.5–15.5)
WBC: 6.1 10*3/uL (ref 4.0–10.5)
nRBC: 0 % (ref 0.0–0.2)

## 2018-09-09 LAB — GLUCOSE, CAPILLARY
GLUCOSE-CAPILLARY: 169 mg/dL — AB (ref 70–99)
GLUCOSE-CAPILLARY: 114 mg/dL — AB (ref 70–99)
GLUCOSE-CAPILLARY: 66 mg/dL — AB (ref 70–99)

## 2018-09-09 LAB — TYPE AND SCREEN
ABO/RH(D): B POS
Antibody Screen: NEGATIVE

## 2018-09-09 LAB — TROPONIN I
Troponin I: 0.06 ng/mL (ref <0.03)
Troponin I: 0.1 ng/mL (ref <0.03)

## 2018-09-09 LAB — ABO/RH: ABO/RH(D): B POS

## 2018-09-09 LAB — MRSA PCR SCREENING: MRSA BY PCR: NEGATIVE

## 2018-09-09 LAB — ECHOCARDIOGRAM COMPLETE

## 2018-09-09 MED ORDER — ENOXAPARIN SODIUM 30 MG/0.3ML ~~LOC~~ SOLN
30.0000 mg | SUBCUTANEOUS | Status: DC
  Administered 2018-09-09 – 2018-09-12 (×4): 30 mg via SUBCUTANEOUS
  Filled 2018-09-09 (×4): qty 0.3

## 2018-09-09 MED ORDER — ONDANSETRON HCL 4 MG PO TABS
4.0000 mg | ORAL_TABLET | Freq: Four times a day (QID) | ORAL | Status: DC | PRN
  Administered 2018-09-12: 4 mg via ORAL
  Filled 2018-09-09: qty 1

## 2018-09-09 MED ORDER — FUROSEMIDE 10 MG/ML IJ SOLN
20.0000 mg | Freq: Two times a day (BID) | INTRAMUSCULAR | Status: DC
  Administered 2018-09-09 – 2018-09-10 (×2): 20 mg via INTRAVENOUS
  Filled 2018-09-09 (×2): qty 2

## 2018-09-09 MED ORDER — ONDANSETRON HCL 4 MG/2ML IJ SOLN
4.0000 mg | Freq: Four times a day (QID) | INTRAMUSCULAR | Status: DC | PRN
  Administered 2018-09-10: 4 mg via INTRAVENOUS
  Filled 2018-09-09: qty 2

## 2018-09-09 MED ORDER — CITALOPRAM HYDROBROMIDE 10 MG PO TABS
10.0000 mg | ORAL_TABLET | Freq: Every day | ORAL | Status: DC
  Administered 2018-09-09: 10 mg via ORAL
  Filled 2018-09-09 (×2): qty 1

## 2018-09-09 MED ORDER — POLYETHYLENE GLYCOL 3350 17 G PO PACK
17.0000 g | PACK | Freq: Every day | ORAL | Status: DC | PRN
  Administered 2018-09-10: 17 g via ORAL
  Filled 2018-09-09: qty 1

## 2018-09-09 MED ORDER — INSULIN ASPART 100 UNIT/ML ~~LOC~~ SOLN
0.0000 [IU] | Freq: Every day | SUBCUTANEOUS | Status: DC
  Administered 2018-09-12: 2 [IU] via SUBCUTANEOUS

## 2018-09-09 MED ORDER — ASPIRIN EC 81 MG PO TBEC
81.0000 mg | DELAYED_RELEASE_TABLET | Freq: Every day | ORAL | Status: DC
  Administered 2018-09-09 – 2018-09-13 (×5): 81 mg via ORAL
  Filled 2018-09-09 (×5): qty 1

## 2018-09-09 MED ORDER — AMLODIPINE BESYLATE 10 MG PO TABS
10.0000 mg | ORAL_TABLET | Freq: Every day | ORAL | Status: DC
  Administered 2018-09-09 – 2018-09-13 (×5): 10 mg via ORAL
  Filled 2018-09-09 (×6): qty 1

## 2018-09-09 MED ORDER — SODIUM CHLORIDE 0.9% FLUSH
3.0000 mL | Freq: Two times a day (BID) | INTRAVENOUS | Status: DC
  Administered 2018-09-09 – 2018-09-13 (×9): 3 mL via INTRAVENOUS

## 2018-09-09 MED ORDER — SODIUM CHLORIDE 0.9% FLUSH: 3.0000 mL | INTRAVENOUS | Status: DC | PRN

## 2018-09-09 MED ORDER — CLOPIDOGREL BISULFATE 75 MG PO TABS
75.0000 mg | ORAL_TABLET | Freq: Every day | ORAL | Status: DC
  Administered 2018-09-09 – 2018-09-13 (×5): 75 mg via ORAL
  Filled 2018-09-09 (×5): qty 1

## 2018-09-09 MED ORDER — SODIUM CHLORIDE 0.9 % IV SOLN: 250.0000 mL | INTRAVENOUS | Status: DC | PRN

## 2018-09-09 MED ORDER — INFLUENZA VAC SPLIT HIGH-DOSE 0.5 ML IM SUSY
0.5000 mL | PREFILLED_SYRINGE | INTRAMUSCULAR | Status: DC
  Filled 2018-09-09 (×2): qty 0.5

## 2018-09-09 MED ORDER — PANTOPRAZOLE SODIUM 20 MG PO TBEC
20.0000 mg | DELAYED_RELEASE_TABLET | Freq: Every day | ORAL | Status: DC
  Administered 2018-09-09 – 2018-09-13 (×5): 20 mg via ORAL
  Filled 2018-09-09 (×5): qty 1

## 2018-09-09 MED ORDER — CARVEDILOL 6.25 MG PO TABS
6.2500 mg | ORAL_TABLET | Freq: Every day | ORAL | Status: DC
  Administered 2018-09-09: 6.25 mg via ORAL
  Filled 2018-09-09: qty 1

## 2018-09-09 MED ORDER — ACETAMINOPHEN 325 MG PO TABS: 650.0000 mg | ORAL_TABLET | Freq: Four times a day (QID) | ORAL | Status: DC | PRN

## 2018-09-09 MED ORDER — ACETAMINOPHEN 650 MG RE SUPP: 650.0000 mg | Freq: Four times a day (QID) | RECTAL | Status: DC | PRN

## 2018-09-09 MED ORDER — BENZONATATE 100 MG PO CAPS
100.0000 mg | ORAL_CAPSULE | Freq: Three times a day (TID) | ORAL | Status: DC
  Administered 2018-09-09 – 2018-09-13 (×13): 100 mg via ORAL
  Filled 2018-09-09 (×13): qty 1

## 2018-09-09 MED ORDER — INSULIN ASPART 100 UNIT/ML ~~LOC~~ SOLN
0.0000 [IU] | Freq: Three times a day (TID) | SUBCUTANEOUS | Status: DC
  Administered 2018-09-09: 2 [IU] via SUBCUTANEOUS
  Administered 2018-09-10: 3 [IU] via SUBCUTANEOUS
  Administered 2018-09-11 – 2018-09-13 (×3): 2 [IU] via SUBCUTANEOUS

## 2018-09-09 MED ORDER — PERFLUTREN LIPID MICROSPHERE
1.0000 mL | INTRAVENOUS | Status: AC | PRN
  Administered 2018-09-09: 2 mL via INTRAVENOUS
  Filled 2018-09-09: qty 10

## 2018-09-09 MED ORDER — ATORVASTATIN CALCIUM 10 MG PO TABS
10.0000 mg | ORAL_TABLET | Freq: Every day | ORAL | Status: DC
  Administered 2018-09-09 – 2018-09-12 (×4): 10 mg via ORAL
  Filled 2018-09-09 (×4): qty 1

## 2018-09-09 MED ORDER — FUROSEMIDE 10 MG/ML IJ SOLN
40.0000 mg | Freq: Once | INTRAMUSCULAR | Status: AC
  Administered 2018-09-09: 40 mg via INTRAVENOUS
  Filled 2018-09-09: qty 4

## 2018-09-09 MED ORDER — HYDROCODONE-ACETAMINOPHEN 5-325 MG PO TABS: 1.0000 | ORAL_TABLET | ORAL | Status: DC | PRN

## 2018-09-09 MED ORDER — PERFLUTREN LIPID MICROSPHERE
INTRAVENOUS | Status: AC
  Administered 2018-09-09: 2 mL via INTRAVENOUS
  Filled 2018-09-09: qty 10

## 2018-09-09 NOTE — ED Notes (Signed)
Lab results given to Ellis.

## 2018-09-09 NOTE — ED Notes (Signed)
Pt's room air sats 87%. Placed pt on 4L [place] with improvement to 90-93%

## 2018-09-09 NOTE — ED Provider Notes (Signed)
[facility] EMERGENCY DEPARTMENT Provider Note   CSN: [id number] Arrival date & time: 09/09/18  8563     History   Chief Complaint Chief Complaint  Patient presents with  . Shortness of Breath    HPI [name] is a 84 y.o. female.  Level 5 caveat for mild dementia.  Patient presents with dyspnea for 2 to 3 days with associated left-sided chest pain.  Past medical history includes anemia, diabetes, hypertension, dyslipidemia.  Symptoms are worse with exertion.  Severity is moderate.  Initial pulse ox on room air 87%.     Past Medical History:  Diagnosis Date  . Anemia    family unsure of this history  . Arthritis   . Chronic ulcer of calf (HCC)    Left  . Constipation   . Diabetes mellitus without complication ([place])   . Dyslipidemia   . GERD (gastroesophageal reflux disease)   . Hypertension   . Peripheral vascular disease [facility])     Patient Active Problem List   Diagnosis Date Noted  . Anemia in chronic kidney disease 08/25/2018  . Gangrene of toe of left foot ([place]) 12/23/2017  . PAD (peripheral artery disease) ([place]) 09/21/2017  . Atherosclerosis of artery of extremity with ulceration ([place]) 09/08/2017  . Atherosclerosis of native arteries of the extremities with ulceration ([place]) 06/25/2017  . Stroke ([place]) 12/31/2013  . TIA (transient ischemic attack) 12/31/2013  . Acute ischemic stroke ([place]) 12/31/2013  . ARF (acute renal failure) ([place]) 12/31/2013  . Hypertension   . Diabetes mellitus without complication ([place])   . Dyslipidemia   . Constipation   . Anemia     Past Surgical History:  Procedure Laterality Date  . ABDOMINAL AORTOGRAM W/LOWER EXTREMITY Left 12/27/2017   Procedure: ABDOMINAL AORTOGRAM W/LOWER EXTREMITY;  Surgeon: Algernon Huxley, MD;  Location: [place] CV LAB;  Service: Cardiovascular;  Laterality: Left;  . ABDOMINAL HYSTERECTOMY    . AMPUTATION TOE Left 12/23/2017   Procedure: AMPUTATION TOE LEFT GREAT TOE;   Surgeon: Sharlotte Alamo, DPM;  Location: [facility] ORS;  Service: Podiatry;  Laterality: Left;  . ENDARTERECTOMY FEMORAL Left 09/08/2017   Procedure: ENDARTERECTOMY FEMORAL WITH CORMATRIX PATCH;  Surgeon: Algernon Huxley, MD;  Location: [facility] ORS;  Service: Vascular;  Laterality: Left;  . EYE SURGERY Bilateral    Cataract Extraction with IOL  . LOWER EXTREMITY ANGIOGRAPHY Left 07/22/2017   Procedure: Lower Extremity Angiography;  Surgeon: Algernon Huxley, MD;  Location: [place] CV LAB;  Service: Cardiovascular;  Laterality: Left;  . LOWER EXTREMITY ANGIOGRAPHY Left 08/16/2017   Procedure: Lower Extremity Angiography;  Surgeon: Algernon Huxley, MD;  Location: [place] CV LAB;  Service: Cardiovascular;  Laterality: Left;  Marland Kitchen MULTIPLE TOOTH EXTRACTIONS       OB History   None      Home Medications    Prior to Admission medications   Medication Sig Start Date End Date Taking? Authorizing Provider  amLODipine (NORVASC) 10 MG tablet Take 10 mg by mouth daily.   Yes [provider]  aspirin EC 81 MG tablet Take 81 mg by mouth daily.   Yes [provider]  atorvastatin (LIPITOR) 10 MG tablet Take 10 mg by mouth daily.   Yes [provider]  azithromycin (ZITHROMAX) 250 MG tablet Take 250-500 mg by mouth See admin instructions. Take 2 tablets on day 1, then 1 tablet on days 2-6 09/06/18 09/11/18 Yes [provider]  benzonatate (TESSALON) 100 MG capsule Take  100 mg by mouth 3 (three) times daily. 09/06/18  Yes [provider]  Calcium Carbonate-Vitamin D (CALCIUM 500 + D) 500-125 MG-UNIT TABS Take by mouth.   Yes [provider]  Calcium-Magnesium-Zinc (CAL-MAG-ZINC PO) Take 1 tablet by mouth daily.   Yes [provider]  carvedilol (COREG) 6.25 MG tablet Take 6.25 mg by mouth at bedtime.   Yes [provider]  Cholecalciferol (VITAMIN D3 GUMMIES ADULT PO) Take 1 tablet by mouth daily.   Yes [provider]  citalopram  (CELEXA) 10 MG tablet Take 1 tablet by mouth daily. 05/12/18  Yes [provider]  clopidogrel (PLAVIX) 75 MG tablet Take 1 tablet (75 mg total) by mouth daily with breakfast. 01/01/14  Yes Barton Dubois, MD  metFORMIN (GLUCOPHAGE) 1000 MG tablet Take 1,000 mg by mouth 2 (two) times daily with a meal.   Yes [provider]  pantoprazole (PROTONIX) 20 MG tablet Take 1 tablet (20 mg total) by mouth daily. 07/13/14  Yes Kindl, Nelda Severe, MD  polyethylene glycol (MIRALAX / GLYCOLAX) packet Take 17 g by mouth daily as needed (for constipation.).    Yes [provider]  valsartan-hydrochlorothiazide (DIOVAN-HCT) 320-25 MG tablet Take by mouth. 08/05/17  Yes [provider]  ACCU-CHEK FASTCLIX LANCETS MISC TEST TID 12/31/17   [provider]  ACCU-CHEK GUIDE test strip  12/31/17   [provider]  Blood Glucose Monitoring Suppl (ACCU-CHEK GUIDE) w/Device KIT See admin instructions. 12/28/17   [provider]    Family History History reviewed. No pertinent family history.  Social History Social History   Tobacco Use  . Smoking status: Never Smoker  . Smokeless tobacco: Never Used  Substance Use Topics  . Alcohol use: No  . Drug use: No     Allergies   Patient has no known allergies.   Review of Systems Review of Systems  Unable to perform ROS: Dementia     Physical Exam Updated Vital Signs BP 140/84   Pulse 80   Temp (!) 96.1 F (35.6 C) (Oral)   Resp (!) 27   SpO2 (!) 89%   Physical Exam  Constitutional: She is oriented to person, place, and time. She appears well-developed and well-nourished.  No frank dyspnea.  HENT:  Head: Normocephalic and atraumatic.  Eyes: Conjunctivae are normal.  Neck: Neck supple.  Cardiovascular: Normal rate and regular rhythm.  Pulmonary/Chest: Effort normal.  Bibasilar rales right greater than left.  Abdominal: Soft. Bowel sounds are normal.  Musculoskeletal: Normal range of motion.    Neurological: She is alert and oriented to person, place, and time.  Skin: Skin is warm and dry.  Psychiatric:  Flat affect  Nursing note and vitals reviewed.    ED Treatments / Results  Labs (all labs ordered are listed, but only abnormal results are displayed) Labs Reviewed  BASIC METABOLIC PANEL - Abnormal; Notable for the following components:      Result Value   Glucose, Bld 117 (*)    Creatinine, Ser 2.00 (*)    GFR calc non Af Amer 21 (*)    GFR calc Af Amer 24 (*)    All other components within normal limits  CBC - Abnormal; Notable for the following components:   RBC 3.35 (*)    Hemoglobin 8.8 (*)    HCT 27.9 (*)    RDW 18.5 (*)    All other components within normal limits  D-DIMER, QUANTITATIVE (NOT AT [facility]) - Abnormal; Notable for the following  components:   D-Dimer, Quant 1.16 (*)    All other components within normal limits  I-STAT TROPONIN, ED - Abnormal; Notable for the following components:   Troponin i, poc 0.11 (*)    All other components within normal limits  TYPE AND SCREEN    EKG EKG Interpretation  Date/Time:  Friday September 09 2018 08:31:40 EDT Ventricular Rate:  103 PR Interval:    QRS Duration: 86 QT Interval:  360 QTC Calculation: 472 R Axis:   81 Text Interpretation:  Sinus tachycardia Probable anterior infarct, age indeterminate Confirmed by Nat Christen [phone]) on 09/09/2018 9:55:25 AM   Radiology Dg Chest 2 View  Result Date: 09/09/2018 CLINICAL DATA:  Increased sob.Recent blood transfusion EXAM: CHEST - 2 VIEW COMPARISON:  None. FINDINGS: Heart size is enlarged and accentuated by technique. There are airspace filling opacities throughout the central perihilar regions and bilateral LOWER lobes. There are bilateral pleural effusions. There is atherosclerotic calcification of the thoracic aorta. Degenerative changes are seen in thoracic spine. IMPRESSION: Cardiomegaly and pulmonary edema. Electronically Signed   By: Nolon Nations M.D.    On: 09/09/2018 09:20    Procedures Procedures (including critical care time)  Medications Ordered in ED Medications  furosemide (LASIX) injection 40 mg (40 mg Intravenous Given 09/09/18 0955)     Initial Impression / Assessment and Plan / ED Course  I have reviewed the triage vital signs and the nursing notes.  Pertinent labs & imaging results that were available during my care of the patient were reviewed by me and considered in my medical decision making (see chart for details).     Patient presents with dyspnea and chest pain.  She is not an extremis.  Pulse ox low.  EKG shows no acute changes.  Troponin 0 0.11.  CHF shows pulmonary edema.  IV Lasix 40 mg administered.  Discussed with cardiology admit to general medicine.   CRITICAL CARE Performed by: Nat Christen Total critical care time: 30 minutes Critical care time was exclusive of separately billable procedures and treating other patients. Critical care was necessary to treat or prevent imminent or life-threatening deterioration. Critical care was time spent personally by me on the following activities: development of treatment plan with patient and/or surrogate as well as nursing, discussions with consultants, evaluation of patient's response to treatment, examination of patient, obtaining history from patient or surrogate, ordering and performing treatments and interventions, ordering and review of laboratory studies, ordering and review of radiographic studies, pulse oximetry and re-evaluation of patient's condition.  Final Clinical Impressions(s) / ED Diagnoses   Final diagnoses:  Acute congestive heart failure, unspecified heart failure type ([place])  Elevated troponin    ED Discharge Orders    None       Nat Christen, MD 09/09/18 1044

## 2018-09-09 NOTE — Progress Notes (Signed)
CRITICAL VALUE ALERT  Critical Value:  Troponin 0.06   Date & Time Notied:  09/09/2018 1615  Provider Notified: Dr. Laren Everts

## 2018-09-09 NOTE — Progress Notes (Signed)
*  PRELIMINARY RESULTS* Echocardiogram 2D Echocardiogram with definity has been performed.  Darlina Sicilian M 09/09/2018, 3:32 PM

## 2018-09-09 NOTE — ED Triage Notes (Signed)
Pt in c/o shortness of breath for awhile, states she recently had to receive a blood transfusion, no distress noted

## 2018-09-09 NOTE — H&P (Addendum)
[facility]                                                                                    Patient Demographics  [name], is a 84 y.o. female  CSN: [id number]  MRN: [id number]  DOB - December 09, 1925  Admit Date - 09/09/2018  Outpatient Primary MD for the patient is Lucianne Lei, MD   With History of -  Past Medical History:  Diagnosis Date  . Anemia    family unsure of this history  . Arthritis   . Chronic ulcer of calf (HCC)    Left  . Constipation   . Diabetes mellitus without complication ([place])   . Dyslipidemia   . GERD (gastroesophageal reflux disease)   . Hypertension   . Peripheral vascular disease [facility])       Past Surgical History:  Procedure Laterality Date  . ABDOMINAL AORTOGRAM W/LOWER EXTREMITY Left 12/27/2017   Procedure: ABDOMINAL AORTOGRAM W/LOWER EXTREMITY;  Surgeon: Algernon Huxley, MD;  Location: [place] CV LAB;  Service: Cardiovascular;  Laterality: Left;  . ABDOMINAL HYSTERECTOMY    . AMPUTATION TOE Left 12/23/2017   Procedure: AMPUTATION TOE LEFT GREAT TOE;  Surgeon: Sharlotte Alamo, DPM;  Location: [facility] ORS;  Service: Podiatry;  Laterality: Left;  . ENDARTERECTOMY FEMORAL Left 09/08/2017   Procedure: ENDARTERECTOMY FEMORAL WITH CORMATRIX PATCH;  Surgeon: Algernon Huxley, MD;  Location: [facility] ORS;  Service: Vascular;  Laterality: Left;  . EYE SURGERY Bilateral    Cataract Extraction with IOL  . LOWER EXTREMITY ANGIOGRAPHY Left 07/22/2017   Procedure: Lower Extremity Angiography;  Surgeon: Algernon Huxley, MD;  Location: [place] CV LAB;  Service: Cardiovascular;  Laterality: Left;  . LOWER EXTREMITY ANGIOGRAPHY Left 08/16/2017   Procedure: Lower Extremity Angiography;  Surgeon: Algernon Huxley, MD;  Location: [place] CV LAB;  Service: Cardiovascular;  Laterality: Left;  Marland Kitchen MULTIPLE TOOTH EXTRACTIONS      in for   Chief Complaint  Patient presents with  . Shortness of Breath     HPI  [name]  is a 84 y.o. female,  with past medical history significant for hypertension, hyperlipidemia and diabetes mellitus presenting with 2 to 3 days of left-sided chest discomfort with shortness of breath.  According to her family symptoms are worse with exertion.  In the emergency room the patient was noted to be hypoxemic with initial pulse ox on room air at 87%.  Her chest x-ray showed congestive heart failure.  Patient is not a good historian and most of the history was taken from her son at bedside    Review of Systems    Unable to obtain  Social History Social History   Tobacco Use  . Smoking status: Never Smoker  . Smokeless tobacco: Never Used  Substance Use Topics  . Alcohol use: No     Family History History reviewed. No pertinent family history.   Prior to Admission medications   Medication Sig Start Date End Date Taking? Authorizing Provider  amLODipine (NORVASC) 10 MG tablet Take 10 mg by mouth daily.   Yes [provider]  aspirin EC 81 MG tablet Take 81 mg  by mouth daily.   Yes [provider]  atorvastatin (LIPITOR) 10 MG tablet Take 10 mg by mouth daily.   Yes [provider]  azithromycin (ZITHROMAX) 250 MG tablet Take 250-500 mg by mouth See admin instructions. Take 2 tablets on day 1, then 1 tablet on days 2-6 09/06/18 09/11/18 Yes [provider]  benzonatate (TESSALON) 100 MG capsule Take 100 mg by mouth 3 (three) times daily. 09/06/18  Yes [provider]  Calcium Carbonate-Vitamin D (CALCIUM 500 + D) 500-125 MG-UNIT TABS Take by mouth.   Yes [provider]  Calcium-Magnesium-Zinc (CAL-MAG-ZINC PO) Take 1 tablet by mouth daily.   Yes [provider]  carvedilol (COREG) 6.25 MG tablet Take 6.25 mg by mouth at bedtime.   Yes [provider]  Cholecalciferol (VITAMIN D3 GUMMIES ADULT PO) Take 1 tablet by mouth daily.   Yes [provider]  citalopram (CELEXA) 10 MG tablet Take 1 tablet by mouth daily. 05/12/18   Yes [provider]  clopidogrel (PLAVIX) 75 MG tablet Take 1 tablet (75 mg total) by mouth daily with breakfast. 01/01/14  Yes Barton Dubois, MD  metFORMIN (GLUCOPHAGE) 1000 MG tablet Take 1,000 mg by mouth 2 (two) times daily with a meal.   Yes [provider]  pantoprazole (PROTONIX) 20 MG tablet Take 1 tablet (20 mg total) by mouth daily. 07/13/14  Yes Kindl, Nelda Severe, MD  polyethylene glycol (MIRALAX / GLYCOLAX) packet Take 17 g by mouth daily as needed (for constipation.).    Yes [provider]  valsartan-hydrochlorothiazide (DIOVAN-HCT) 320-25 MG tablet Take by mouth. 08/05/17  Yes [provider]  ACCU-CHEK FASTCLIX LANCETS MISC TEST TID 12/31/17   [provider]  ACCU-CHEK GUIDE test strip  12/31/17   [provider]  Blood Glucose Monitoring Suppl (ACCU-CHEK GUIDE) w/Device KIT See admin instructions. 12/28/17   [provider]    No Known Allergies  Physical Exam  Vitals  Blood pressure (!) 146/75, pulse 82, temperature (!) 96.1 F (35.6 C), temperature source Oral, resp. rate (!) 26, SpO2 90 %.   1. General elderly female, well-developed, well-nourished extremely pleasant  2. Normal affect and insight, Not Suicidal or Homicidal, pleasantly confused.  3. No F.N deficits, grossly, patient moving all extremities.  4. Ears and Eyes appear Normal, Conjunctivae clear, PERRLA. Moist Oral Mucosa.  5. Supple Neck, No JVD, No cervical lymphadenopathy appriciated, No Carotid Bruits.  6. Symmetrical Chest wall movement, mild bilateral crackles.  7. RRR, No Gallops, Rubs or Murmurs, No Parasternal Heave.  8. Positive Bowel Sounds, Abdomen Soft, Non tender, No organomegaly appriciated,No rebound -guarding or rigidity.  9.  No Cyanosis, Normal Skin Turgor, No Skin Rash or Bruise.  10. Good muscle tone,  joints appear normal , no effusions, Normal ROM.    Data Review  CBC Recent Labs  Lab 09/09/18 0841  WBC 6.1  HGB  8.8*  HCT 27.9*  PLT 329  MCV 83.3  MCH 26.3  MCHC 31.5  RDW 18.5*   ------------------------------------------------------------------------------------------------------------------  Chemistries  Recent Labs  Lab 09/09/18 0841  NA 141  K 4.1  CL 108  CO2 22  GLUCOSE 117*  BUN 23  CREATININE 2.00*  CALCIUM 9.4   ------------------------------------------------------------------------------------------------------------------ CrCl cannot be calculated (Unknown ideal weight.). ------------------------------------------------------------------------------------------------------------------ No results for input(s): TSH, T4TOTAL, T3FREE, THYROIDAB in the last 72 hours.  Invalid input(s): FREET3   Coagulation profile No results for input(s): INR, PROTIME in the last 168 hours. ------------------------------------------------------------------------------------------------------------------- Recent Labs  09/09/18 0846  DDIMER 1.16*   -------------------------------------------------------------------------------------------------------------------  Cardiac Enzymes No results for input(s): CKMB, TROPONINI, MYOGLOBIN in the last 168 hours.  Invalid input(s): CK ------------------------------------------------------------------------------------------------------------------ Invalid input(s): POCBNP   ---------------------------------------------------------------------------------------------------------------  Urinalysis    Component Value Date/Time   COLORURINE STRAW (A) 12/23/2017 2020   APPEARANCEUR CLEAR (A) 12/23/2017 2020   LABSPEC 1.009 12/23/2017 2020   PHURINE 7.0 12/23/2017 2020   GLUCOSEU NEGATIVE 12/23/2017 2020   HGBUR NEGATIVE 12/23/2017 2020   BILIRUBINUR NEGATIVE 12/23/2017 2020   KETONESUR NEGATIVE 12/23/2017 2020   PROTEINUR NEGATIVE 12/23/2017 2020   UROBILINOGEN 1.0 09/07/2016 1403   NITRITE NEGATIVE 12/23/2017 2020   LEUKOCYTESUR  NEGATIVE 12/23/2017 2020    ----------------------------------------------------------------------------------------------------------------  Imaging results:   Dg Chest 2 View  Result Date: 09/09/2018 CLINICAL DATA:  Increased sob.Recent blood transfusion EXAM: CHEST - 2 VIEW COMPARISON:  None. FINDINGS: Heart size is enlarged and accentuated by technique. There are airspace filling opacities throughout the central perihilar regions and bilateral LOWER lobes. There are bilateral pleural effusions. There is atherosclerotic calcification of the thoracic aorta. Degenerative changes are seen in thoracic spine. IMPRESSION: Cardiomegaly and pulmonary edema. Electronically Signed   By: Nolon Nations M.D.   On: 09/09/2018 09:20   US Abdomen Limited  Result Date: 08/26/2018 CLINICAL DATA:  84 year old female with 1 month of left upper quadrant abdominal pain. Query splenomegaly. EXAM: ULTRASOUND ABDOMEN LIMITED COMPARISON:  CTA abdomen 10/19/2017. FINDINGS: Stable and normal spleen size compared to the 2018 CTA. Estimated splenic volume is 74 milliliters (normal splenic volume range 83 - 412 mL). Splenic length is 7.9 centimeters. Splenic echotexture is within normal limits. Suggestion of a small left pleural effusion. No left upper quadrant free fluid. IMPRESSION: 1. Suggestion of a small left pleural effusion. Consider follow-up chest radiographs. 2. Stable and normal spleen. Electronically Signed   By: Genevie [name] M.D.   On: 08/26/2018 16:20    My personal review of EKG: Normal sinus rhythm at 93 bpm with old anterior MI .  Assessment & Plan   Acute coronary syndrome    Serial troponins    Continue with aspirin and Plavix    Surgery following  Congestive heart failure     Last echocardiogram done in 2015 showing ejection fraction 55 to 60%    Lasix IV    Check echocardiogram  Acute kidney injury on chronic      Monitor creatinine     DC Diovan  Diabetes mellitus on metformin     DC  metformin due to high creatinine     Sliding scale  Hypertension     Continue with Norvasc/Coreg/DC Diovan  Hyperlipidemia    Continue with Lipitor  Depression    Renew Celexa  Constipation    Continue with MiraLAX  Consulted palliative care for goals of care issues.   DVT Prophylaxis Lovenox  AM Labs Ordered, also please review Full Orders  Family Communication: Admission, patients condition and plan of care including tests being ordered have been discussed with theson who indicates understanding and agrees with the plan and Code Status.  Code Status Full  Disposition Plan: undetermined  Time spent in minutes : 45 min  Condition GUARDED   _0 @

## 2018-09-09 NOTE — ED Notes (Signed)
Attempted report 

## 2018-09-10 DIAGNOSIS — I248 Other forms of acute ischemic heart disease: Secondary | ICD-10-CM

## 2018-09-10 DIAGNOSIS — N184 Chronic kidney disease, stage 4 (severe): Secondary | ICD-10-CM

## 2018-09-10 DIAGNOSIS — I5043 Acute on chronic combined systolic (congestive) and diastolic (congestive) heart failure: Secondary | ICD-10-CM

## 2018-09-10 DIAGNOSIS — I5021 Acute systolic (congestive) heart failure: Secondary | ICD-10-CM | POA: Diagnosis present

## 2018-09-10 LAB — BASIC METABOLIC PANEL
Anion gap: 10 (ref 5–15)
BUN: 23 mg/dL (ref 8–23)
CO2: 24 mmol/L (ref 22–32)
Calcium: 8.9 mg/dL (ref 8.9–10.3)
Chloride: 107 mmol/L (ref 98–111)
Creatinine, Ser: 1.99 mg/dL — ABNORMAL HIGH (ref 0.44–1.00)
GFR calc Af Amer: 24 mL/min — ABNORMAL LOW (ref >60)
GFR calc non Af Amer: 21 mL/min — ABNORMAL LOW (ref >60)
Glucose, Bld: 110 mg/dL — ABNORMAL HIGH (ref 70–99)
Potassium: 3.6 mmol/L (ref 3.5–5.1)
Sodium: 141 mmol/L (ref 135–145)

## 2018-09-10 LAB — GLUCOSE, CAPILLARY
GLUCOSE-CAPILLARY: 92 mg/dL (ref 70–99)
GLUCOSE-CAPILLARY: 98 mg/dL (ref 70–99)
GLUCOSE-CAPILLARY: 213 mg/dL — AB (ref 70–99)
Glucose-Capillary: 92 mg/dL (ref 70–99)

## 2018-09-10 LAB — TROPONIN I: TROPONIN I: 0.12 ng/mL — AB (ref <0.03)

## 2018-09-10 LAB — BRAIN NATRIURETIC PEPTIDE: B Natriuretic Peptide: 676.4 pg/mL — ABNORMAL HIGH (ref 0.0–100.0)

## 2018-09-10 MED ORDER — FUROSEMIDE 10 MG/ML IJ SOLN
80.0000 mg | Freq: Two times a day (BID) | INTRAMUSCULAR | Status: DC
  Administered 2018-09-10 – 2018-09-11 (×2): 80 mg via INTRAVENOUS
  Filled 2018-09-10 (×2): qty 8

## 2018-09-10 MED ORDER — CARVEDILOL 6.25 MG PO TABS
6.2500 mg | ORAL_TABLET | Freq: Two times a day (BID) | ORAL | Status: DC
  Administered 2018-09-10 – 2018-09-13 (×6): 6.25 mg via ORAL
  Filled 2018-09-10 (×6): qty 1

## 2018-09-10 NOTE — Consult Note (Signed)
Cardiology Consultation:   Patient ID: [name] MRN: [id number]; DOB: 27-Mar-1926  Admit date: 09/09/2018 Date of Consult: 09/10/2018  Primary Care Provider: Lucianne Lei, MD Primary Cardiologist: New to [facility]   Patient Profile:   [name] is a 84 y.o. female with a hx of HTN, HLD, DM, anemia of renal disease with CKD (followed by hematology oncology) and PVD s/p left femoral endarterectomy (followed by [facility])  who is being seen today for the evaluation of acute CHF at the request of Dr. Denton Brick.   History of Present Illness:   [name] with no prior documented CAD presented with chest pain and shortness of breath.  No family at bedside currently.  Patient is poor historian and has baseline dementia.  Hx obtained from reviewing chart.  Chest pain, palpitation, orthopnea, PND, lower extremity edema or melena.  Denies Reported dyspnea on exertion for unknown period of time.  In the emergency room she was found hypoxic at 87%.  Chest x-ray showed cardiomegaly with pulmonary edema. Elevated serum creatinine and troponin.  Low hemoglobin at 8.8.  D-dimer 1.16.  Given IV Lasix 40 mg x 1.  Now on IV Lasix 20 mg twice daily.  Net I&O +488 cc.  Echo showed newly depressed LV function to 20 to 25% with suspicious of Takotsubo or multivessel CAD.  Elevated RVSP. Marland Kitchen  Echo 09/09/18 Study Conclusions  - Procedure narrative: Transthoracic echocardiography. Image   quality was adequate. The study was technically difficult, as a   result of poor sound wave transmission. Intravenous contrast   (Definity) was administered. - Left ventricle: The cavity size was normal. Wall thickness was   increased in a pattern of mild LVH. Systolic function was   severely reduced. The estimated ejection fraction was in the   range of 20% to 25%. No apical thrombus with Definity contrast.   Severe apical hypokinesis with preserved basal function,   suggestive of possible  Takatsubo cardiomyopathy or multivessel   coronary artery disease. Doppler parameters are consistent with   abnormal left ventricular relaxation (grade 1 diastolic   dysfunction). LV filling pressures are markedly elevated. - Aortic valve: Sclerosis without stenosis. There was mild   regurgitation. Valve area (Vmax): 1.28 cm^2. - Mitral valve: Mildly thickened leaflets . Sclerotic anterior   leaflet. There was mild to moderate regurgitation. - Left atrium: The atrium was normal in size. - Tricuspid valve: There was moderate regurgitation. - Pulmonary arteries: PA peak pressure: 56 mm Hg (S). - Inferior vena cava: The vessel was dilated. The respirophasic   diameter changes were blunted (< 50%), consistent with elevated   central venous pressure. - Pericardium, extracardiac: There was no pericardial effusion.   There was a left pleural effusion.  Impressions:  - Technically difficult study. Definity contrast given. Compared to   a prior study in 2015, the LVEF is markedly lower at 20-25% with   predominantly mid to distal anterior, apical, inferior and   lateral wall hypokinesis with a preserved basal function,   suggestive of possible Takatsubo cardimyopathy or multivessel   coronary artery disease. LV filling pressure is very high. RVSP   is 56 mmHg with a dilated IVC and left pleural effusion.  Past Medical History:  Diagnosis Date  . ACS (acute coronary syndrome) ([place]) 08/2018  . Anemia    family unsure of this history  . Arthritis   . Chronic ulcer of calf (HCC)    Left  . Constipation   .  Diabetes mellitus without complication ([place])   . Dyslipidemia   . Dyspnea   . GERD (gastroesophageal reflux disease)   . Hypertension   . Peripheral vascular disease [facility])     Past Surgical History:  Procedure Laterality Date  . ABDOMINAL AORTOGRAM W/LOWER EXTREMITY Left 12/27/2017   Procedure: ABDOMINAL AORTOGRAM W/LOWER EXTREMITY;  Surgeon: Algernon Huxley, MD;  Location: [place] [facility];  Service: Cardiovascular;  Laterality: Left;  . ABDOMINAL HYSTERECTOMY    . AMPUTATION TOE Left 12/23/2017   Procedure: AMPUTATION TOE LEFT GREAT TOE;  Surgeon: Sharlotte Alamo, DPM;  Location: [facility] OR[facility];  Service: Podiatry;  Laterality: Left;  . ENDARTERECTOMY FEMORAL Left 09/08/2017   Procedure: ENDARTERECTOMY FEMORAL WITH CORMATRIX PATCH;  Surgeon: Algernon Huxley, MD;  Location: [facility] OR[facility];  Service: Vascular;  Laterality: Left;  . EYE SURGERY Bilateral    Cataract Extraction with IOL  . LOWER EXTREMITY ANGIOGRAPHY Left 07/22/2017   Procedure: Lower Extremity Angiography;  Surgeon: Algernon Huxley, MD;  Location: [place] [facility];  Service: Cardiovascular;  Laterality: Left;  . LOWER EXTREMITY ANGIOGRAPHY Left 08/16/2017   Procedure: Lower Extremity Angiography;  Surgeon: Algernon Huxley, MD;  Location: [place] [facility];  Service: Cardiovascular;  Laterality: Left;  Marland Kitchen MULTIPLE TOOTH EXTRACTIONS         Inpatient Medications: Scheduled Meds: . amLODipine  10 mg Oral Daily  . aspirin EC  81 mg Oral Daily  . atorvastatin  10 mg Oral q1800  . benzonatate  100 mg Oral TID  . carvedilol  6.25 mg Oral QHS  . clopidogrel  75 mg Oral Q breakfast  . enoxaparin (LOVENOX) injection  30 mg Subcutaneous Q24H  . furosemide  20 mg Intravenous Q12H  . Influenza vac split quadrivalent PF  0.5 mL Intramuscular Tomorrow-1000  . insulin aspart  0-5 Units Subcutaneous QHS  . insulin aspart  0-9 Units Subcutaneous TID WC  . pantoprazole  20 mg Oral Daily  . sodium chloride flush  3 mL Intravenous Q12H   Continuous Infusions: . sodium chloride     PRN Meds: sodium chloride, acetaminophen **OR** acetaminophen, HYDROcodone-acetaminophen, ondansetron **OR** ondansetron (ZOFRAN) IV, polyethylene glycol, sodium chloride flush  Allergies:   No Known Allergies  Social History:   Social History   Socioeconomic History  . Marital status: Legally Separated    Spouse name: Not on file  . Number  of children: Not on file  . Years of education: Not on file  . Highest education level: Not on file  Occupational History  . Not on file  Social Needs  . Financial resource strain: Not on file  . Food insecurity:    Worry: Not on file    Inability: Not on file  . Transportation needs:    Medical: Not on file    Non-medical: Not on file  Tobacco Use  . Smoking status: Never Smoker  . Smokeless tobacco: Never Used  Substance and Sexual Activity  . Alcohol use: No  . Drug use: No  . Sexual activity: Not Currently  Lifestyle  . Physical activity:    Days per week: Not on file    Minutes per session: Not on file  . Stress: Not on file  Relationships  . Social connections:    Talks on phone: Not on file    Gets together: Not on file    Attends religious service: Not on file    Active member of club or organization: Not on file  Attends meetings of clubs or organizations: Not on file    Relationship status: Not on file  . Intimate partner violence:    Fear of current or ex partner: Not on file    Emotionally abused: Not on file    Physically abused: Not on file    Forced sexual activity: Not on file  Other Topics Concern  . Not on file  Social History Narrative  . Not on file    Family History:   History reviewed. No pertinent family history.  Says no family hx of CAd  ROS:  Please see the history of present illness.  All other ROS reviewed and negative.     Physical Exam/Data:   Vitals:   09/09/18 1344 09/09/18 1421 09/09/18 2050 09/10/18 0609  BP: (!) 145/82 (!) 141/80 120/67 135/77  Pulse: 85 84 78 86  Resp: (!) 21  (!) 31 (!) 28  Temp:  (!) 97.5 F (36.4 C) 98.2 F (36.8 C) (!) 97.5 F (36.4 C)  TempSrc:  Oral Oral Oral  SpO2: 93% (!) 88% 100% 95%  Weight:    55.8 kg    Intake/Output Summary (Last 24 hours) at 09/10/2018 1135 Last data filed at 09/10/2018 0655 Gross per 24 hour  Intake 963 ml  Output 475 ml  Net 488 ml   Filed Weights    09/10/18 0609  Weight: 55.8 kg   Body mass index is 19.57 kg/m.  General:  Thin frail female in no acute distress HEENT: normal Lymph: no adenopathy Neck: + JVD Endocrine:  No thryomegaly Vascular: No carotid bruits; FA pulses 2+ bilaterally without bruits  Cardiac:  normal S1, S2; RRR; no murmur  Lungs: Faint rales bibasilar Abd: soft, nontender, no hepatomegaly  Ext: no edema Musculoskeletal:  No deformities, BUE and BLE strength normal and equal Skin: warm and dry  Neuro:  CNs 2-12 intact, no focal abnormalities noted Psych:  Normal affect   EKG:  The EKG was personally reviewed and demonstrates: EKG shows sinus rhythm at rate of 87 bpm today with anterior lateral T wave inversion which is more pronounced than yesterday. Telemetry:  Telemetry was personally reviewed and demonstrates: Sinus rhythm with rare PVC  Relevant CV Studies: As summarized above  Laboratory Data:  Chemistry Recent Labs  Lab 09/09/18 0841 09/10/18 0257  NA 141 141  K 4.1 3.6  CL 108 107  CO2 22 24  GLUCOSE 117* 110*  BUN 23 23  CREATININE 2.00* 1.99*  CALCIUM 9.4 8.9  GFRNONAA 21* 21*  GFRAA 24* 24*  ANIONGAP 11 10    No results for input(s): PROT, ALBUMIN, AST, ALT, ALKPHOS, BILITOT in the last 168 hours. Hematology Recent Labs  Lab 09/09/18 0841  WBC 6.1  RBC 3.35*  HGB 8.8*  HCT 27.9*  MCV 83.3  MCH 26.3  MCHC 31.5  RDW 18.5*  PLT 329   Cardiac Enzymes Recent Labs  Lab 09/09/18 1429 09/09/18 2101 09/10/18 0257  TROPONINI 0.06* 0.10* 0.12*    Recent Labs  Lab 09/09/18 0858 09/09/18 1206  TROPIPOC 0.11* 0.08    BNPNo results for input(s): BNP, PROBNP in the last 168 hours.  DDimer  Recent Labs  Lab 09/09/18 0846  DDIMER 1.16*    Radiology/Studies:  Dg Chest 2 View  Result Date: 09/09/2018 CLINICAL DATA:  Increased sob.Recent blood transfusion EXAM: CHEST - 2 VIEW COMPARISON:  None. FINDINGS: Heart size is enlarged and accentuated by technique. There are  airspace filling opacities throughout the central perihilar  regions and bilateral LOWER lobes. There are bilateral pleural effusions. There is atherosclerotic calcification of the thoracic aorta. Degenerative changes are seen in thoracic spine. IMPRESSION: Cardiomegaly and pulmonary edema. Electronically Signed   By: Nolon Nations M.D.   On: 09/09/2018 09:20    Assessment and Plan:   1. Acute systolic heart failure -Newly depressed LV function to 20 to 25% compared to 2015.  Features concerning for Takotsubo versus multivessel CAD.  She denies chest pain.  Presenting with shortness of breath.  Troponin minimally elevated as above.  EKG showed anterior lateral T wave inversion which is more pronounced than yesterday.  Currently denies any chest pain.  She will need ischemic evaluation.  However elevated serum creatinine with unknown functional status in 84 year-old female. Will review with MD. -Continue IV diuresis.  Strict I&O and daily weight.  Lungs with faint rales.  No lower extremity edema. -Continue Coreg 6.25 mg twice daily.  No ACE/ARB due to CKD.    2.  Elevated troponin with abnormal EKG -As above.  No chest pain.  Could be demand in setting of CHF exacerbation.  Check BNP.  3.  Chronic kidney disease stage III/IV -Her creatinine was normal in January 2019.  However creatinine ranging into since 04/2018. -Follow renal function closely with diuresis.  4.  Anemia of chronic disease -Followed by hematology. Results for DHANVI, BOESEN (MRN [id number]) as of 09/10/2018 11:56  Ref. Range 12/26/2017 09:38 12/27/2017 05:33 05/25/2018 11:35 08/25/2018 10:14 09/09/2018 08:41  Hemoglobin Latest Ref Range: 12.0 - 15.0 g/dL 9.6 (L) 8.9 (L) 8.8 (L) 8.0 (L) 8.8 (L)   5.  Peripheral vascular disease -Followed by  vein and vascular.  Likely she takes aspirin and Plavix due to this.  For questions or updates, please contact [place] Please consult [email or web address] for contact info  under     Jarrett Soho, PA  09/10/2018 11:35 AM

## 2018-09-10 NOTE — Progress Notes (Addendum)
Patient Demographics:    [name], is a 84 y.o. female, DOB - [birth year], RDE:[id number]  Admit date - 09/09/2018   Admitting Physician Merton Border, MD  Outpatient Primary MD for the patient is Lucianne Lei, MD  LOS - 1   Chief Complaint  Patient presents with  . Shortness of Breath        Subjective:    [name] today has no fevers, no emesis,  Sob and orthopnea noted, grand-daughter at bedside, requiring oxygen  Assessment  & Plan :    Principal Problem:   Acute coronary syndrome (HCC) Active Problems:   Hypertension   PAD (peripheral artery disease) (HCC)   Anemia in chronic kidney disease   Acute HFrEF (heart failure with reduced ejection fraction) (HCC)   CKD (chronic kidney disease), stage IV [facility]  Brief Summary:- 84 year old [place] female with a history of hypertension, dyslipidemia, type 2 diabetes, stage III chronic kidney disease and peripheral vascular disease status post femoral endarterectomy admitted on 09/09/2018 with shortness of breath chest pains elevated troponin and found to have depressed EF of 20 to 25% compared to 55 to 60% back in 2015  Plan:- 1)HFrEF--- acute systolic dysfunction CHF, EF on echo from 09/09/2018 is 20 to 25%,???? possible multivessel disease Vs Takotsubo cardiomyopathy, cardiology consult appreciated, okay to increase Lasix to 80 mg twice daily, monitor daily weight and fluid balance closely, weight is currently 156 kg previous weight may have been as high as 59 kg in the past, ???  Weight accuracy.  Cardiologist recommends medical management without left heart catheterization given tenuous renal situation, avoid ACEI/ARB/ARNI due to renal concerns  2)Elevated Troponin/chest pain and abnormal EKG--- please see #1 above, continue aspirin 81 mg daily, Lipitor 10 mg daily, and Plavix 75 mg daily as well as Coreg 6.25 mg twice  daily  3)AKI----acute kidney injury on CKD stage - III to IV,  creatinine on admission= 2.0 ,  baseline creatinine = 2.2  , creatinine is now= 1.99   , renally adjust medications, avoid nephrotoxic agents/dehydration/hypotension, avoid ACEI/ARB/ARNI due to renal concerns  4)Anemia of CKD--- hgb stable at 8.8, appears stable at this time, normochromic and hypochromic   5)PAD-- status post femoral endarterectomy, c/n aspirin 81 mg daily, Lipitor 10 mg daily, and Plavix 75 mg daily  6)H/o DM2-- last A1C  5.3, Use Novolog/Humalog Sliding scale insulin with Accu-Cheks/Fingersticks as ordered   7)Elevated D-Dimer---  D-Dimer 1.1, Vq scan pending  8) acute hypoxic respiratory failure--- secondary to #1 #2 above  Disposition/Need for in-Hospital Stay- patient unable to be discharged at this time due to acute heart failure and elevated troponins requiring aggressive IV diuresis and ongoing monitoring of electrolytes and renal function along with diuresis, also awaiting PE rule out with a VQ scan, patient is requiring oxygen  Code Status : Full   Disposition Plan  : TBD  Consults  : Cardiology   DVT Prophylaxis  :  Lovenox  Lab Results  Component Value Date   PLT 329 09/09/2018    Inpatient Medications  Scheduled Meds: . amLODipine  10 mg Oral Daily  . aspirin EC  81 mg Oral Daily  . atorvastatin  10 mg Oral q1800  . benzonatate  100 mg Oral TID  .  carvedilol  6.25 mg Oral QHS  . clopidogrel  75 mg Oral Q breakfast  . enoxaparin (LOVENOX) injection  30 mg Subcutaneous Q24H  . furosemide  80 mg Intravenous BID  . Influenza vac split quadrivalent PF  0.5 mL Intramuscular Tomorrow-1000  . insulin aspart  0-5 Units Subcutaneous QHS  . insulin aspart  0-9 Units Subcutaneous TID WC  . pantoprazole  20 mg Oral Daily  . sodium chloride flush  3 mL Intravenous Q12H   Continuous Infusions: . sodium chloride     PRN Meds:.sodium chloride, acetaminophen **OR** acetaminophen,  HYDROcodone-acetaminophen, ondansetron **OR** ondansetron (ZOFRAN) IV, polyethylene glycol, sodium chloride flush    Anti-infectives (From admission, onward)   None        Objective:   Vitals:   09/09/18 1344 09/09/18 1421 09/09/18 2050 09/10/18 0609  BP: (!) 145/82 (!) 141/80 120/67 135/77  Pulse: 85 84 78 86  Resp: (!) 21  (!) 31 (!) 28  Temp:  (!) 97.5 F (36.4 C) 98.2 F (36.8 C) (!) 97.5 F (36.4 C)  TempSrc:  Oral Oral Oral  SpO2: 93% (!) 88% 100% 95%  Weight:    55.8 kg    Wt Readings from Last 3 Encounters:  09/10/18 55.8 kg  08/25/18 58.9 kg  05/25/18 53.7 kg     Intake/Output Summary (Last 24 hours) at 09/10/2018 1258 Last data filed at 09/10/2018 1093 Gross per 24 hour  Intake 963 ml  Output 200 ml  Net 763 ml     Physical Exam Patient is examined daily including today on 10/12/9 , exams remain the same as of yesterday except that has changed   Gen:- Awake Alert, in no acute distress HEENT:- [place].AT, No sclera icterus Nose- [place] 2 L/min Neck-Supple Neck, +ve JVD,.  Lungs-diminished in bases with faint bibasilar rales, CV- S1, S2 normal, regular Abd-  +ve B.Sounds, Abd Soft, No tenderness,    Extremity/Skin:-Warm and dry with good pulses  psych-affect is appropriate, oriented x3 Neuro-no new focal deficits, no tremors   Data Review:   Micro Results Recent Results (from the past 240 hour(s))  MRSA PCR Screening     Status: None   Collection Time: 09/09/18  2:35 PM  Result Value Ref Range Status   MRSA by PCR NEGATIVE NEGATIVE Final    Comment:        The GeneXpert MRSA Assay (FDA approved for NASAL specimens only), is one component of a comprehensive MRSA colonization surveillance program. It is not intended to diagnose MRSA infection nor to guide or monitor treatment for MRSA infections. Performed at [facility], [place] [address], [place], [name] [address]     Radiology Reports Dg Chest 2 View  Result Date:  09/09/2018 CLINICAL DATA:  Increased sob.Recent blood transfusion EXAM: CHEST - 2 VIEW COMPARISON:  None. FINDINGS: Heart size is enlarged and accentuated by technique. There are airspace filling opacities throughout the central perihilar regions and bilateral LOWER lobes. There are bilateral pleural effusions. There is atherosclerotic calcification of the thoracic aorta. Degenerative changes are seen in thoracic spine. IMPRESSION: Cardiomegaly and pulmonary edema. Electronically Signed   By: Nolon Nations M.D.   On: 09/09/2018 09:20   US Abdomen Limited  Result Date: 08/26/2018 CLINICAL DATA:  84 year old female with 1 month of left upper quadrant abdominal pain. Query splenomegaly. EXAM: ULTRASOUND ABDOMEN LIMITED COMPARISON:  CTA abdomen 10/19/2017. FINDINGS: Stable and normal spleen size compared to the 2018 CTA. Estimated splenic volume is 74 milliliters (normal splenic  volume range 83 - 412 mL). Splenic length is 7.9 centimeters. Splenic echotexture is within normal limits. Suggestion of a small left pleural effusion. No left upper quadrant free fluid. IMPRESSION: 1. Suggestion of a small left pleural effusion. Consider follow-up chest radiographs. 2. Stable and normal spleen. Electronically Signed   By: Genevie [name] M.D.   On: 08/26/2018 16:20     CBC Recent Labs  Lab 09/09/18 0841  WBC 6.1  HGB 8.8*  HCT 27.9*  PLT 329  MCV 83.3  MCH 26.3  MCHC 31.5  RDW 18.5*    Chemistries  Recent Labs  Lab 09/09/18 0841 09/10/18 0257  NA 141 141  K 4.1 3.6  CL 108 107  CO2 22 24  GLUCOSE 117* 110*  BUN 23 23  CREATININE 2.00* 1.99*  CALCIUM 9.4 8.9   ------------------------------------------------------------------------------------------------------------------ No results for input(s): CHOL, HDL, LDLCALC, TRIG, CHOLHDL, LDLDIRECT in the last 72 hours.  Lab Results  Component Value Date   HGBA1C 5.3 09/09/2018    ------------------------------------------------------------------------------------------------------------------ No results for input(s): TSH, T4TOTAL, T3FREE, THYROIDAB in the last 72 hours.  Invalid input(s): FREET3 ------------------------------------------------------------------------------------------------------------------ No results for input(s): VITAMINB12, FOLATE, FERRITIN, TIBC, IRON, RETICCTPCT in the last 72 hours.  Coagulation profile No results for input(s): INR, PROTIME in the last 168 hours.  Recent Labs    09/09/18 0846  DDIMER 1.16*    Cardiac Enzymes Recent Labs  Lab 09/09/18 1429 09/09/18 2101 09/10/18 0257  TROPONINI 0.06* 0.10* 0.12*   ------------------------------------------------------------------------------------------------------------------ No results found for: BNP   Roxan Hockey M.D on 09/10/2018 at 12:58 PM  Pager---[phone] Go to [email or web address] - password [email or web address] for contact info  [facility] - Office  [phone]

## 2018-09-11 ENCOUNTER — Inpatient Hospital Stay (HOSPITAL_COMMUNITY): Payer: Medicare Other

## 2018-09-11 DIAGNOSIS — Z66 Do not resuscitate: Secondary | ICD-10-CM

## 2018-09-11 DIAGNOSIS — R531 Weakness: Secondary | ICD-10-CM

## 2018-09-11 DIAGNOSIS — I5021 Acute systolic (congestive) heart failure: Secondary | ICD-10-CM

## 2018-09-11 DIAGNOSIS — I509 Heart failure, unspecified: Secondary | ICD-10-CM

## 2018-09-11 DIAGNOSIS — I739 Peripheral vascular disease, unspecified: Secondary | ICD-10-CM

## 2018-09-11 DIAGNOSIS — I249 Acute ischemic heart disease, unspecified: Secondary | ICD-10-CM

## 2018-09-11 DIAGNOSIS — Z515 Encounter for palliative care: Secondary | ICD-10-CM

## 2018-09-11 LAB — BASIC METABOLIC PANEL
Anion gap: 11 (ref 5–15)
BUN: 26 mg/dL — AB (ref 8–23)
CO2: 24 mmol/L (ref 22–32)
Calcium: 8.4 mg/dL — ABNORMAL LOW (ref 8.9–10.3)
Chloride: 106 mmol/L (ref 98–111)
Creatinine, Ser: 2.5 mg/dL — ABNORMAL HIGH (ref 0.44–1.00)
GFR, EST AFRICAN AMERICAN: 18 mL/min — AB (ref >60)
GFR, EST NON AFRICAN AMERICAN: 16 mL/min — AB (ref >60)
Glucose, Bld: 97 mg/dL (ref 70–99)
Potassium: 3.5 mmol/L (ref 3.5–5.1)
SODIUM: 141 mmol/L (ref 135–145)

## 2018-09-11 LAB — CBC
HEMATOCRIT: 24.1 % — AB (ref 36.0–46.0)
Hemoglobin: 7.7 g/dL — ABNORMAL LOW (ref 12.0–15.0)
MCH: 26.1 pg (ref 26.0–34.0)
MCHC: 32 g/dL (ref 30.0–36.0)
MCV: 81.7 fL (ref 80.0–100.0)
PLATELETS: 275 10*3/uL (ref 150–400)
RBC: 2.95 MIL/uL — AB (ref 3.87–5.11)
RDW: 18 % — ABNORMAL HIGH (ref 11.5–15.5)
WBC: 5 10*3/uL (ref 4.0–10.5)
nRBC: 0 % (ref 0.0–0.2)

## 2018-09-11 LAB — GLUCOSE, CAPILLARY
GLUCOSE-CAPILLARY: 171 mg/dL — AB (ref 70–99)
GLUCOSE-CAPILLARY: 113 mg/dL — AB (ref 70–99)
GLUCOSE-CAPILLARY: 180 mg/dL — AB (ref 70–99)
Glucose-Capillary: 84 mg/dL (ref 70–99)

## 2018-09-11 MED ORDER — TECHNETIUM TO 99M ALBUMIN AGGREGATED
4.1500 | Freq: Once | INTRAVENOUS | Status: AC | PRN
  Administered 2018-09-11: 4.15 via INTRAVENOUS

## 2018-09-11 MED ORDER — TECHNETIUM TC 99M DIETHYLENETRIAME-PENTAACETIC ACID
32.6000 | Freq: Once | INTRAVENOUS | Status: AC | PRN
  Administered 2018-09-11: 32.6 via RESPIRATORY_TRACT

## 2018-09-11 NOTE — Plan of Care (Signed)
Monitor O2 sats. CXR done. VQ scan done.

## 2018-09-11 NOTE — Progress Notes (Signed)
Patient Demographics:    [name], is a 84 y.o. female, DOB - 1926/06/05, UEA:[id number]  Admit date - 09/09/2018   Admitting Physician Merton Border, MD  Outpatient Primary MD for the patient is Lucianne Lei, MD  LOS - 2   Chief Complaint  Patient presents with  . Shortness of Breath        Subjective:    [name] today has no fevers, no emesis,  grand-daughter at bedside, requiring oxygen, continues to have dyspnea, voided at least 6-7 times over the last 24 hours but no proper charting of urine output  Assessment  & Plan :    Principal Problem:   Acute coronary syndrome (HCC) Active Problems:   Hypertension   PAD (peripheral artery disease) (HCC)   Anemia in chronic kidney disease   Acute HFrEF (heart failure with reduced ejection fraction) (HCC)   CKD (chronic kidney disease), stage IV [facility])  Brief Summary:- 84 year old [place] female with a history of hypertension, dyslipidemia, type 2 diabetes, stage III chronic kidney disease and peripheral vascular disease status post femoral endarterectomy admitted on 09/09/2018 with shortness of breath chest pains elevated troponin and found to have depressed EF of 20 to 25% compared to 55 to 60% back in 2015  Plan:- 1)HFrEF--- acute systolic dysfunction CHF, EF on echo from 09/09/2018 is 20 to 25%,???? possible multivessel disease Vs Takotsubo cardiomyopathy, cardiology consult appreciated, repeat chest x-ray on 09/11/2018 with bilateral effusions on primary venous congestion ,  requiring oxygen, continues to have dyspnea, voided at least 6-7 times over the last 24 hours but no proper charting of urine output , Cardiologist advised holding Lasix due to worsening renal function , weight is currently 56.9 kg previous weight may have been as high as 59 kg in the past, ???  Weight accuracy.  Cardiologist recommends medical management without left  heart catheterization given tenuous renal situation, avoid ACEI/ARB/ARNI due to renal concerns  2)Elevated Troponin/chest pain and abnormal EKG--- please see #1 above, continue aspirin 81 mg daily, Lipitor 10 mg daily, and Plavix 75 mg daily as well as Coreg 6.25 mg twice daily  3)AKI----acute kidney injury on CKD stage - III to IV, creatinine is trending up, cardiologist advised holding Lasix as of 09/11/2018, creatinine on admission= 2.0 ,  baseline creatinine = 2.2  , creatinine is now= 2.5    , renally adjust medications, avoid nephrotoxic agents/dehydration/hypotension, avoid ACEI/ARB/ARNI due to renal concerns  4)Anemia of CKD--- hgb down to 7.7 from 8.8, check stool occult blood, consider transfusion of 1 unit of PRBcs given concerns about CAD/CHF with need to keep hemoglobin close to 9 , she has normochromic and hypochromic   5)PAD-- status post femoral endarterectomy, c/n aspirin 81 mg daily, Lipitor 10 mg daily, and Plavix 75 mg daily  6)H/o DM2-- last A1C  5.3, Use Novolog/Humalog Sliding scale insulin with Accu-Cheks/Fingersticks as ordered   7)Elevated D-Dimer---  D-Dimer 1.1, Vq scan done, results pending  8) acute hypoxic respiratory failure--- secondary to #1 #2 above  Disposition/Need for in-Hospital Stay- patient unable to be discharged at this time due to acute heart failure and elevated troponins requiring aggressive IV diuresis and ongoing monitoring of electrolytes and renal function along with diuresis, also awaiting PE rule out  with a VQ scan, patient is requiring oxygen  Code Status : Full   Disposition Plan  : TBD  Consults  : Cardiology   DVT Prophylaxis  :  Lovenox  Lab Results  Component Value Date   PLT 275 09/11/2018    Inpatient Medications  Scheduled Meds: . amLODipine  10 mg Oral Daily  . aspirin EC  81 mg Oral Daily  . atorvastatin  10 mg Oral q1800  . benzonatate  100 mg Oral TID  . carvedilol  6.25 mg Oral BID WC  . clopidogrel  75 mg Oral  Q breakfast  . enoxaparin (LOVENOX) injection  30 mg Subcutaneous Q24H  . Influenza vac split quadrivalent PF  0.5 mL Intramuscular Tomorrow-1000  . insulin aspart  0-5 Units Subcutaneous QHS  . insulin aspart  0-9 Units Subcutaneous TID WC  . pantoprazole  20 mg Oral Daily  . sodium chloride flush  3 mL Intravenous Q12H   Continuous Infusions: . sodium chloride     PRN Meds:.sodium chloride, acetaminophen **OR** acetaminophen, HYDROcodone-acetaminophen, ondansetron **OR** ondansetron (ZOFRAN) IV, polyethylene glycol, sodium chloride flush    Anti-infectives (From admission, onward)   None        Objective:   Vitals:   09/10/18 2050 09/11/18 0632 09/11/18 0800 09/11/18 1421  BP: 97/62 137/77  (!) 107/55  Pulse: 73 92 85 79  Resp: (!) 25 (!) 25 18   Temp: 97.6 F (36.4 C) (!) 97.4 F (36.3 C)  (!) 94.6 F (34.8 C)  TempSrc:  Oral  Oral  SpO2: 91% (!) 87% 94% 96%  Weight:  56.9 kg      Wt Readings from Last 3 Encounters:  09/11/18 56.9 kg  08/25/18 58.9 kg  05/25/18 53.7 kg    No intake or output data in the 24 hours ending 09/11/18 1432   Physical Exam Patient is examined daily including today on 09/11/18 , exams remain the same as of yesterday except that has changed   Gen:- Awake Alert, in no acute distress HEENT:- [place].AT, No sclera icterus Nose- [place] 2 L/min Neck-Supple Neck, +ve JVD,.  Lungs-diminished in bases with faint bibasilar rales, CV- S1, S2 normal, regular Abd-  +ve B.Sounds, Abd Soft, No tenderness,    Extremity/Skin:-Warm and dry with good pulses  psych-affect is appropriate, oriented x3 Neuro-no new focal deficits, no tremors   Data Review:   Micro Results Recent Results (from the past 240 hour(s))  MRSA PCR Screening     Status: None   Collection Time: 09/09/18  2:35 PM  Result Value Ref Range Status   MRSA by PCR NEGATIVE NEGATIVE Final    Comment:        The GeneXpert MRSA Assay (FDA approved for NASAL specimens only), is one  component of a comprehensive MRSA colonization surveillance program. It is not intended to diagnose MRSA infection nor to guide or monitor treatment for MRSA infections. Performed at [facility], [place] [address] [place], [place] [address]     Radiology Reports Dg Chest 2 View  Result Date: 09/11/2018 CLINICAL DATA:  Shortness of breath and chest pain EXAM: CHEST - 2 VIEW COMPARISON:  09/09/2018 FINDINGS: Cardiac shadow is enlarged. Aortic calcifications are seen. Bibasilar infiltrates are noted increased from the prior exam. Stable vascular congestion and interstitial edema is noted. Small bilateral pleural effusions are noted as well. IMPRESSION: Bibasilar infiltrates and effusions with mild vascular congestion. Electronically Signed   By: Inez Catalina M.D.   On: 09/11/2018  12:40   Dg Chest 2 View  Result Date: 09/09/2018 CLINICAL DATA:  Increased sob.Recent blood transfusion EXAM: CHEST - 2 VIEW COMPARISON:  None. FINDINGS: Heart size is enlarged and accentuated by technique. There are airspace filling opacities throughout the central perihilar regions and bilateral LOWER lobes. There are bilateral pleural effusions. There is atherosclerotic calcification of the thoracic aorta. Degenerative changes are seen in thoracic spine. IMPRESSION: Cardiomegaly and pulmonary edema. Electronically Signed   By: Nolon Nations M.D.   On: 09/09/2018 09:20   US Abdomen Limited  Result Date: 08/26/2018 CLINICAL DATA:  84 year old female with 1 month of left upper quadrant abdominal pain. Query splenomegaly. EXAM: ULTRASOUND ABDOMEN LIMITED COMPARISON:  CTA abdomen 10/19/2017. FINDINGS: Stable and normal spleen size compared to the 2018 CTA. Estimated splenic volume is 74 milliliters (normal splenic volume range 83 - 412 mL). Splenic length is 7.9 centimeters. Splenic echotexture is within normal limits. Suggestion of a small left pleural effusion. No left upper quadrant free fluid. IMPRESSION: 1.  Suggestion of a small left pleural effusion. Consider follow-up chest radiographs. 2. Stable and normal spleen. Electronically Signed   By: Genevie [name] M.D.   On: 08/26/2018 16:20     CBC Recent Labs  Lab 09/09/18 0841 09/11/18 0354  WBC 6.1 5.0  HGB 8.8* 7.7*  HCT 27.9* 24.1*  PLT 329 275  MCV 83.3 81.7  MCH 26.3 26.1  MCHC 31.5 32.0  RDW 18.5* 18.0*    Chemistries  Recent Labs  Lab 09/09/18 0841 09/10/18 0257 09/11/18 0354  NA 141 141 141  K 4.1 3.6 3.5  CL 108 107 106  CO2 22 24 24   GLUCOSE 117* 110* 97  BUN 23 23 26*  CREATININE 2.00* 1.99* 2.50*  CALCIUM 9.4 8.9 8.4*   ------------------------------------------------------------------------------------------------------------------ No results for input(s): CHOL, HDL, LDLCALC, TRIG, CHOLHDL, LDLDIRECT in the last 72 hours.  Lab Results  Component Value Date   HGBA1C 5.3 09/09/2018   ------------------------------------------------------------------------------------------------------------------ No results for input(s): TSH, T4TOTAL, T3FREE, THYROIDAB in the last 72 hours.  Invalid input(s): FREET3 ------------------------------------------------------------------------------------------------------------------ No results for input(s): VITAMINB12, FOLATE, FERRITIN, TIBC, IRON, RETICCTPCT in the last 72 hours.  Coagulation profile No results for input(s): INR, PROTIME in the last 168 hours.  Recent Labs    09/09/18 0846  DDIMER 1.16*    Cardiac Enzymes Recent Labs  Lab 09/09/18 1429 09/09/18 2101 09/10/18 0257  TROPONINI 0.06* 0.10* 0.12*   ------------------------------------------------------------------------------------------------------------------    Component Value Date/Time   BNP 676.4 (H) 09/09/2018 1419     [name] M.D on 09/11/2018 at 2:32 PM  Pager---[phone] Go to [email or web address] - password [email or web address] for contact info  [facility] - Office  [phone]

## 2018-09-11 NOTE — Progress Notes (Signed)
Patient was not available for evaluation today. Case d/w with Dr. Denton Brick. Will plan holding lasix in the setting of worsening renal failure. LVEF declined to 20-25% - urine output seems to have declined. May require inotropes for diuresis, however, given age and comorbidies, she is not a great candidate for long-term advanced CHF therapy. Agree with palliative care evaluation.  Cardiology will see again on Monday.  Alexa Casino, MD, [facility], [place] Director of the Advanced Lipid Disorders &  Cardiovascular Risk Reduction Clinic Diplomate of the American Board of Clinical Lipidology Attending Cardiologist  Direct Dial: [phone]  Fax: [phone]  Website:  [email or web address] [name] 09/11/2018, 10:10 AM

## 2018-09-11 NOTE — Consult Note (Signed)
Consultation Note Date: 09/11/2018   Patient Name: [name]  DOB: Aug 06, 1926  MRN: [id number]  Age / Sex: 84 y.o., female  PCP: Lucianne Lei, MD Referring Physician: Roxan Hockey, MD  Reason for Consultation: Establishing goals of care and Psychosocial/spiritual support  HPI/Patient Profile: 84 y.o. female   admitted on 09/09/2018 with  history of hypertension, dyslipidemia, type 2 diabetes, stage III chronic kidney disease, and peripheral vascular disease status post femoral endarterectomy, who presented with acute shortness of breath and hypoxia and was found to have congestive heart failure with a newly depressed LVEF of 20 to 25%.  According to her family symptoms are worse with exertion.   In the emergency room the patient was noted to be hypoxemic with initial pulse ox on room air at 87%.  Her chest x-ray showed congestive heart failure.    Family at the bedside today tell me that the patient continues to live in her home with the support of her family.   Patient has had continued slow physical, functional and cognitive decline.  Family understand the concept of human mortality for this elderly female with multiple comorbidities.  Patient along with her family face treatment option decisions, advanced directive decisions and anticipatory care needs.  Clinical Assessment and Goals of Care:  This NP Wadie Lessen reviewed medical records, received report from team, assessed the patient and then meet at the patient's bedside along with her grand-daughter [name]  to discuss diagnosis, prognosis, GOC, EOL wishes disposition and options.  Concepts of Hospice and Palliative Care were discussed  A detailed discussion was had today regarding advanced directives.  Concepts specific to code status, artifical feeding and hydration, continued IV antibiotics and rehospitalization was had.  The  difference between a aggressive medical intervention path  and a palliative comfort care path for this patient at this time was had.  Values and goals of care important to patient and family were attempted to be elicited.  At this time family remains hopeful for stabilization and return to baseline.  They hope for her to return home.  They would like for "things to remain as normal as possible"  MOST form introduced.  Education on hospice services in the home offered  Questions and concerns addressed.   Family encouraged to call with questions or concerns.    PMT will continue to support holistically.  HCPOA    SUMMARY OF RECOMMENDATIONS    Code Status/Advance Care Planning:  DNR-documented today and family at bedside [name] supports decisions    Palliative Prophylaxis:   Bowel Regimen, Delirium Protocol, Frequent Pain Assessment and Oral Care  Additional Recommendations (Limitations, Scope, Preferences):  Avoid Hospitalization   The treatable and hope for improvement  No other limitations have been placed at this time, however discussion supports a more comfort approach   Psycho-social/Spiritual:   Desire for further Chaplaincy support:no  Additional Recommendations: Education on Hospice  Prognosis:   Unable to determine  Discharge Planning: To Be Determined      Primary Diagnoses:  Present on Admission: . Acute coronary syndrome ([place]) . Acute HFrEF (heart failure with reduced ejection fraction) ([place]) . Hypertension . PAD (peripheral artery disease) ([place]) . Anemia in chronic kidney disease . CKD (chronic kidney disease), stage IV ([place])   I have reviewed the medical record, interviewed the patient and family, and examined the patient. The following aspects are pertinent.  Past Medical History:  Diagnosis Date  . ACS (acute coronary syndrome) ([place]) 08/2018  . Anemia    family unsure of this history  . Arthritis   . Chronic ulcer of calf (HCC)      Left  . Constipation   . Diabetes mellitus without complication ([place])   . Dyslipidemia   . Dyspnea   . GERD (gastroesophageal reflux disease)   . Hypertension   . Peripheral vascular disease ([place])    Social History   Socioeconomic History  . Marital status: Legally Separated    Spouse name: Not on file  . Number of children: Not on file  . Years of education: Not on file  . Highest education level: Not on file  Occupational History  . Not on file  Social Needs  . Financial resource strain: Not on file  . Food insecurity:    Worry: Not on file    Inability: Not on file  . Transportation needs:    Medical: Not on file    Non-medical: Not on file  Tobacco Use  . Smoking status: Never Smoker  . Smokeless tobacco: Never Used  Substance and Sexual Activity  . Alcohol use: No  . Drug use: No  . Sexual activity: Not Currently  Lifestyle  . Physical activity:    Days per week: Not on file    Minutes per session: Not on file  . Stress: Not on file  Relationships  . Social connections:    Talks on phone: Not on file    Gets together: Not on file    Attends religious service: Not on file    Active member of club or organization: Not on file    Attends meetings of clubs or organizations: Not on file    Relationship status: Not on file  Other Topics Concern  . Not on file  Social History Narrative  . Not on file   History reviewed. No pertinent family history. Scheduled Meds: . amLODipine  10 mg Oral Daily  . aspirin EC  81 mg Oral Daily  . atorvastatin  10 mg Oral q1800  . benzonatate  100 mg Oral TID  . carvedilol  6.25 mg Oral BID WC  . clopidogrel  75 mg Oral Q breakfast  . enoxaparin (LOVENOX) injection  30 mg Subcutaneous Q24H  . Influenza vac split quadrivalent PF  0.5 mL Intramuscular Tomorrow-1000  . insulin aspart  0-5 Units Subcutaneous QHS  . insulin aspart  0-9 Units Subcutaneous TID WC  . pantoprazole  20 mg Oral Daily  . sodium chloride flush  3 mL  Intravenous Q12H   Continuous Infusions: . sodium chloride     PRN Meds:.sodium chloride, acetaminophen **OR** acetaminophen, HYDROcodone-acetaminophen, ondansetron **OR** ondansetron (ZOFRAN) IV, polyethylene glycol, sodium chloride flush Medications Prior to Admission:  Prior to Admission medications   Medication Sig Start Date End Date Taking? Authorizing Provider  amLODipine (NORVASC) 10 MG tablet Take 10 mg by mouth daily.   Yes [provider]  aspirin EC 81 MG tablet Take 81 mg by mouth daily.   Yes [provider]  atorvastatin (LIPITOR) 10  MG tablet Take 10 mg by mouth daily.   Yes [provider]  azithromycin (ZITHROMAX) 250 MG tablet Take 250-500 mg by mouth See admin instructions. Take 2 tablets on day 1, then 1 tablet on days 2-6 09/06/18 09/11/18 Yes [provider]  benzonatate (TESSALON) 100 MG capsule Take 100 mg by mouth 3 (three) times daily. 09/06/18  Yes [provider]  Calcium Carbonate-Vitamin D (CALCIUM 500 + D) 500-125 MG-UNIT TABS Take 1 tablet by mouth daily.    Yes [provider]  Calcium-Magnesium-Zinc (CAL-MAG-ZINC PO) Take 1 tablet by mouth daily.   Yes [provider]  carvedilol (COREG) 6.25 MG tablet Take 6.25 mg by mouth at bedtime.   Yes [provider]  Cholecalciferol (VITAMIN D3 GUMMIES ADULT PO) Take 1 tablet by mouth daily.   Yes [provider]  citalopram (CELEXA) 10 MG tablet Take 10 mg by mouth daily.  05/12/18  Yes [provider]  clopidogrel (PLAVIX) 75 MG tablet Take 1 tablet (75 mg total) by mouth daily with breakfast. 01/01/14  Yes Barton Dubois, MD  metFORMIN (GLUCOPHAGE) 1000 MG tablet Take 1,000 mg by mouth 2 (two) times daily with a meal.   Yes [provider]  pantoprazole (PROTONIX) 20 MG tablet Take 1 tablet (20 mg total) by mouth daily. 07/13/14  Yes Kindl, Nelda Severe, MD  polyethylene glycol (MIRALAX / GLYCOLAX) packet Take 17 g by mouth daily  as needed (for constipation.).    Yes [provider]  valsartan-hydrochlorothiazide (DIOVAN-HCT) 320-25 MG tablet Take 1 tablet by mouth daily.  08/05/17  Yes [provider]  ACCU-CHEK FASTCLIX LANCETS MISC TEST TID 12/31/17   [provider]  ACCU-CHEK GUIDE test strip  12/31/17   [provider]  Blood Glucose Monitoring Suppl (ACCU-CHEK GUIDE) w/Device KIT See admin instructions. 12/28/17   [provider]   No Known Allergies Review of Systems  Neurological: Positive for weakness.    Physical Exam  Constitutional: She appears well-developed.  Cardiovascular: Normal rate and regular rhythm.  Pulmonary/Chest: Breath sounds normal.  Skin: Skin is warm and dry.    Vital Signs: BP 137/77 (BP Location: Right Arm)   Pulse 92   Temp (!) 97.4 F (36.3 C) (Oral)   Resp (!) 25   Wt 56.9 kg   SpO2 (!) 87%   BMI 19.95 kg/m  Pain Scale: 0-10   Pain Score: 0-No pain   SpO2: SpO2: (!) 87 % O2 Device:SpO2: (!) 87 % O2 Flow Rate: .O2 Flow Rate (L/min): 2 L/min  IO: Intake/output summary: No intake or output data in the 24 hours ending 09/11/18 1025  LBM: Last BM Date: 09/09/18 Baseline Weight: Weight: 55.8 kg Most recent weight: Weight: 56.9 kg     Palliative Assessment/Data: 50 %    Discussed with Dr Denton Brick  Time In: 0900 Time Out: 1015 Time Total: 75 minutes Greater than 50%  of this time was spent counseling and coordinating care related to the above assessment and plan.  Signed by: Wadie Lessen, NP   Please contact Palliative Medicine Team phone at [phone] for questions and concerns.  For individual provider: See Shea Evans

## 2018-09-12 LAB — BASIC METABOLIC PANEL
Anion gap: 9 (ref 5–15)
BUN: 28 mg/dL — ABNORMAL HIGH (ref 8–23)
CO2: 26 mmol/L (ref 22–32)
Calcium: 8.5 mg/dL — ABNORMAL LOW (ref 8.9–10.3)
Chloride: 106 mmol/L (ref 98–111)
Creatinine, Ser: 2.39 mg/dL — ABNORMAL HIGH (ref 0.44–1.00)
GFR calc Af Amer: 19 mL/min — ABNORMAL LOW (ref >60)
GFR calc non Af Amer: 17 mL/min — ABNORMAL LOW (ref >60)
Glucose, Bld: 102 mg/dL — ABNORMAL HIGH (ref 70–99)
Potassium: 3.8 mmol/L (ref 3.5–5.1)
Sodium: 141 mmol/L (ref 135–145)

## 2018-09-12 LAB — CBC
HCT: 25.6 % — ABNORMAL LOW (ref 36.0–46.0)
Hemoglobin: 8.1 g/dL — ABNORMAL LOW (ref 12.0–15.0)
MCH: 25.6 pg — ABNORMAL LOW (ref 26.0–34.0)
MCHC: 31.6 g/dL (ref 30.0–36.0)
MCV: 81 fL (ref 80.0–100.0)
Platelets: 314 10*3/uL (ref 150–400)
RBC: 3.16 MIL/uL — ABNORMAL LOW (ref 3.87–5.11)
RDW: 17.5 % — ABNORMAL HIGH (ref 11.5–15.5)
WBC: 6.1 10*3/uL (ref 4.0–10.5)
nRBC: 0 % (ref 0.0–0.2)

## 2018-09-12 LAB — GLUCOSE, CAPILLARY
GLUCOSE-CAPILLARY: 239 mg/dL — AB (ref 70–99)
Glucose-Capillary: 98 mg/dL (ref 70–99)
Glucose-Capillary: 161 mg/dL — ABNORMAL HIGH (ref 70–99)
Glucose-Capillary: 90 mg/dL (ref 70–99)

## 2018-09-12 MED ORDER — FUROSEMIDE 10 MG/ML IJ SOLN
60.0000 mg | Freq: Two times a day (BID) | INTRAMUSCULAR | Status: DC
  Administered 2018-09-12 – 2018-09-13 (×2): 60 mg via INTRAVENOUS
  Filled 2018-09-12 (×2): qty 6

## 2018-09-12 NOTE — Progress Notes (Addendum)
Patient Demographics:    [name], is a 84 y.o. female, DOB - [birth year], XFG:[id number]  Admit date - 09/09/2018   Admitting Physician Merton Border, MD  Outpatient Primary MD for the patient is Lucianne Lei, MD  LOS - 3   Chief Complaint  Patient presents with  . Shortness of Breath        Subjective:    [name] today has no fevers, no emesis,   no proper charting of urine output, less short of breath  Assessment  & Plan :    Principal Problem:   Acute coronary syndrome (HCC) Active Problems:   Hypertension   PAD (peripheral artery disease) (HCC)   Anemia in chronic kidney disease   Acute HFrEF (heart failure with reduced ejection fraction) (HCC)   Chronic kidney disease   Acute congestive heart failure ([place])   Palliative care by specialist   DNR (do not resuscitate)   Weakness generalized  Brief Summary:- 84 year old [place] female with a history of hypertension, dyslipidemia, type 2 diabetes, stage III chronic kidney disease and peripheral vascular disease status post femoral endarterectomy admitted on 09/09/2018 with shortness of breath chest pains elevated troponin and found to have depressed EF of 20 to 25% compared to 55 to 60% back in 2015  Plan:- 1)HFrEF--- acute systolic dysfunction CHF, EF on echo from 09/09/2018 is 20 to 25%,???? possible multivessel disease Vs Takotsubo cardiomyopathy, cardiology consult appreciated, repeat chest x-ray on 09/11/2018 with bilateral effusions and pulm venous congestion ,   continues to void/urinate, but no proper charting/documentation of urine output, weight is down to 55.6 kg from 56.9 kg, fluid balance is not acute, restart iv Lasix restarted at 60 mg twice daily on 09/12/2018, dyspnea and hypoxia appears to be improving slowly, , ???  Weight accuracy.  Cardiologist recommends medical management without left heart catheterization given  tenuous renal situation, avoid ACEI/ARB/ARNI due to renal concerns  2)Elevated Troponin/Chest pain and abnormal EKG--- please see #1 above, continue aspirin 81 mg daily, Lipitor 10 mg daily, and Plavix 75 mg daily as well as Coreg 6.25 mg twice daily  3)AKI----acute kidney injury on CKD stage - III to IV, creatinine is down to 2.39,  , creatinine on admission= 2.0 ,  baseline creatinine = 2.2  , creatinine is now= 2.39    , renally adjust medications, avoid nephrotoxic agents/dehydration/hypotension, avoid ACEI/ARB/ARNI due to renal concerns, iv Lasix restarted at 60 mg twice daily on 09/12/2018  4)Anemia of CKD--- hgb down to 8.1 from 8.8, check stool occult blood, consider transfusion of 1 unit of PRBcs given concerns about CAD/CHF with need to keep hemoglobin close to 9 , she has normochromic and hypochromic   5)PAD-- status post femoral endarterectomy, c/n aspirin 81 mg daily, Lipitor 10 mg daily, and Plavix 75 mg daily  6)H/o DM2-- last A1C  5.3, Use Novolog/Humalog Sliding scale insulin with Accu-Cheks/Fingersticks as ordered   7)Elevated D-Dimer---  D-Dimer 1.1 (adjusted for age just slightly above expected range), , Vq scan without evidence for  PE  8) acute hypoxic respiratory failure--- secondary to #1 #2 above, continue to wean off O2 currently down to 1 L of oxygen from 4 L of oxygen per minute couple days ago  Disposition/Need for in-Hospital Stay-  patient unable to be discharged at this time due to acute heart failure and elevated troponins requiring aggressive IV diuresis and ongoing monitoring of electrolytes and renal function along with diuresis, patient is requiring oxygen  Code Status : Full  Disposition Plan  : TBD  Consults  : Cardiology   DVT Prophylaxis  :  Lovenox  Lab Results  Component Value Date   PLT 314 09/12/2018    Inpatient Medications  Scheduled Meds: . amLODipine  10 mg Oral Daily  . aspirin EC  81 mg Oral Daily  . atorvastatin  10 mg Oral q1800   . benzonatate  100 mg Oral TID  . carvedilol  6.25 mg Oral BID WC  . clopidogrel  75 mg Oral Q breakfast  . enoxaparin (LOVENOX) injection  30 mg Subcutaneous Q24H  . furosemide  60 mg Intravenous BID  . Influenza vac split quadrivalent PF  0.5 mL Intramuscular Tomorrow-1000  . insulin aspart  0-5 Units Subcutaneous QHS  . insulin aspart  0-9 Units Subcutaneous TID WC  . pantoprazole  20 mg Oral Daily  . sodium chloride flush  3 mL Intravenous Q12H   Continuous Infusions: . sodium chloride     PRN Meds:.sodium chloride, acetaminophen **OR** acetaminophen, HYDROcodone-acetaminophen, ondansetron **OR** ondansetron (ZOFRAN) IV, polyethylene glycol, sodium chloride flush    Anti-infectives (From admission, onward)   None        Objective:   Vitals:   09/12/18 0948 09/12/18 1057 09/12/18 1240 09/12/18 1503  BP: 134/78     Pulse: 92 79 73   Resp:  (!) 32 14   Temp:      TempSrc:      SpO2:  97% 95% 98%  Weight:        Wt Readings from Last 3 Encounters:  09/12/18 55.6 kg  08/25/18 58.9 kg  05/25/18 53.7 kg     Intake/Output Summary (Last 24 hours) at 09/12/2018 1542 Last data filed at 09/12/2018 1000 Gross per 24 hour  Intake 240 ml  Output 350 ml  Net -110 ml     Physical Exam Patient is examined daily including today on 09/12/18 , exams remains the same as of yesterday except that has changed   Gen:- Awake Alert, in no acute distress HEENT:- [place].AT, No sclera icterus Nose- [place] 1 L/min Neck-Supple Neck, +ve JVD,.  Lungs-improving air movement with faint bibasilar rales, CV- S1, S2 normal, regular Abd-  +ve B.Sounds, Abd Soft, No tenderness,    Extremity/Skin:-Warm and dry with good pulses  psych-affect is appropriate, oriented x3 Neuro-no new focal deficits, no tremors   Data Review:   Micro Results Recent Results (from the past 240 hour(s))  MRSA PCR Screening     Status: None   Collection Time: 09/09/18  2:35 PM  Result Value Ref Range Status    MRSA by PCR NEGATIVE NEGATIVE Final    Comment:        The GeneXpert MRSA Assay (FDA approved for NASAL specimens only), is one component of a comprehensive MRSA colonization surveillance program. It is not intended to diagnose MRSA infection nor to guide or monitor treatment for MRSA infections. Performed at [facility], [place] [address], [place], [place] [address]     Radiology Reports Dg Chest 2 View  Result Date: 09/11/2018 CLINICAL DATA:  Shortness of breath and chest pain EXAM: CHEST - 2 VIEW COMPARISON:  09/09/2018 FINDINGS: Cardiac shadow is enlarged. Aortic calcifications are seen. Bibasilar infiltrates are noted increased from the  prior exam. Stable vascular congestion and interstitial edema is noted. Small bilateral pleural effusions are noted as well. IMPRESSION: Bibasilar infiltrates and effusions with mild vascular congestion. Electronically Signed   By: Inez Catalina M.D.   On: 09/11/2018 12:40   Dg Chest 2 View  Result Date: 09/09/2018 CLINICAL DATA:  Increased sob.Recent blood transfusion EXAM: CHEST - 2 VIEW COMPARISON:  None. FINDINGS: Heart size is enlarged and accentuated by technique. There are airspace filling opacities throughout the central perihilar regions and bilateral LOWER lobes. There are bilateral pleural effusions. There is atherosclerotic calcification of the thoracic aorta. Degenerative changes are seen in thoracic spine. IMPRESSION: Cardiomegaly and pulmonary edema. Electronically Signed   By: Nolon Nations M.D.   On: 09/09/2018 09:20   Nm Pulmonary Perf And Vent  Result Date: 09/11/2018 CLINICAL DATA:  Dyspnea.  Elevated D-dimer. EXAM: NUCLEAR MEDICINE VENTILATION - PERFUSION LUNG SCAN TECHNIQUE: Ventilation images were obtained in multiple projections using inhaled aerosol Tc-59m DTPA. Perfusion images were obtained in multiple projections after intravenous injection of Tc-32m-MAA. RADIOPHARMACEUTICALS:  32.6 mCi of Tc-33m DTPA aerosol  inhalation and 4.15 mCi Tc26m-MAA IV COMPARISON:  Chest x-ray dated 09/11/2018. FINDINGS: Ventilation: No focal ventilation defect. Perfusion: No wedge shaped peripheral perfusion defects to suggest acute pulmonary embolism. IMPRESSION: No evidence of pulmonary embolism. Electronically Signed   By: Franki Cabot M.D.   On: 09/11/2018 14:37   US Abdomen Limited  Result Date: 08/26/2018 CLINICAL DATA:  84 year old female with 1 month of left upper quadrant abdominal pain. Query splenomegaly. EXAM: ULTRASOUND ABDOMEN LIMITED COMPARISON:  CTA abdomen 10/19/2017. FINDINGS: Stable and normal spleen size compared to the 2018 CTA. Estimated splenic volume is 74 milliliters (normal splenic volume range 83 - 412 mL). Splenic length is 7.9 centimeters. Splenic echotexture is within normal limits. Suggestion of a small left pleural effusion. No left upper quadrant free fluid. IMPRESSION: 1. Suggestion of a small left pleural effusion. Consider follow-up chest radiographs. 2. Stable and normal spleen. Electronically Signed   By: Genevie [name] M.D.   On: 08/26/2018 16:20     CBC Recent Labs  Lab 09/09/18 0841 09/11/18 0354 09/12/18 0409  WBC 6.1 5.0 6.1  HGB 8.8* 7.7* 8.1*  HCT 27.9* 24.1* 25.6*  PLT 329 275 314  MCV 83.3 81.7 81.0  MCH 26.3 26.1 25.6*  MCHC 31.5 32.0 31.6  RDW 18.5* 18.0* 17.5*    Chemistries  Recent Labs  Lab 09/09/18 0841 09/10/18 0257 09/11/18 0354 09/12/18 0409  NA 141 141 141 141  K 4.1 3.6 3.5 3.8  CL 108 107 106 106  CO2 22 24 24 26   GLUCOSE 117* 110* 97 102*  BUN 23 23 26* 28*  CREATININE 2.00* 1.99* 2.50* 2.39*  CALCIUM 9.4 8.9 8.4* 8.5*   ------------------------------------------------------------------------------------------------------------------ No results for input(s): CHOL, HDL, LDLCALC, TRIG, CHOLHDL, LDLDIRECT in the last 72 hours.  Lab Results  Component Value Date   HGBA1C 5.3 09/09/2018    ------------------------------------------------------------------------------------------------------------------ No results for input(s): TSH, T4TOTAL, T3FREE, THYROIDAB in the last 72 hours.  Invalid input(s): FREET3 ------------------------------------------------------------------------------------------------------------------ No results for input(s): VITAMINB12, FOLATE, FERRITIN, TIBC, IRON, RETICCTPCT in the last 72 hours.  Coagulation profile No results for input(s): INR, PROTIME in the last 168 hours.  No results for input(s): DDIMER in the last 72 hours.  Cardiac Enzymes Recent Labs  Lab 09/09/18 1429 09/09/18 2101 09/10/18 0257  TROPONINI 0.06* 0.10* 0.12*   ------------------------------------------------------------------------------------------------------------------    Component Value Date/Time   BNP 676.4 (H) 09/09/2018 1419  Roxan Hockey M.D on 09/12/2018 at 3:42 PM  Pager---[phone] Go to [email or web address] - password [email or web address] for contact info  [facility] - Office  [phone]

## 2018-09-12 NOTE — Progress Notes (Signed)
Progress Note  Patient Name: [name] Date of Encounter: 09/12/2018  Primary Cardiologist: No primary care provider on file.   Subjective   Feeling well today, no acute concerns.   Inpatient Medications    Scheduled Meds: . amLODipine  10 mg Oral Daily  . aspirin EC  81 mg Oral Daily  . atorvastatin  10 mg Oral q1800  . benzonatate  100 mg Oral TID  . carvedilol  6.25 mg Oral BID WC  . clopidogrel  75 mg Oral Q breakfast  . enoxaparin (LOVENOX) injection  30 mg Subcutaneous Q24H  . furosemide  60 mg Intravenous BID  . Influenza vac split quadrivalent PF  0.5 mL Intramuscular Tomorrow-1000  . insulin aspart  0-5 Units Subcutaneous QHS  . insulin aspart  0-9 Units Subcutaneous TID WC  . pantoprazole  20 mg Oral Daily  . sodium chloride flush  3 mL Intravenous Q12H   Continuous Infusions: . sodium chloride     PRN Meds: sodium chloride, acetaminophen **OR** acetaminophen, HYDROcodone-acetaminophen, ondansetron **OR** ondansetron (ZOFRAN) IV, polyethylene glycol, sodium chloride flush   Vital Signs    Vitals:   09/11/18 2019 09/12/18 0552 09/12/18 0948 09/12/18 1057  BP: 114/65 137/81 134/78   Pulse: 78  92 79  Resp: (!) 34   (!) 32  Temp: (!) 97.5 F (36.4 C) 98.3 F (36.8 C)    TempSrc: Oral Oral    SpO2: 96%   97%  Weight:  55.6 kg      Intake/Output Summary (Last 24 hours) at 09/12/2018 1236 Last data filed at 09/12/2018 1000 Gross per 24 hour  Intake 240 ml  Output 350 ml  Net -110 ml   Filed Weights   09/10/18 0609 09/11/18 0632 09/12/18 0552  Weight: 55.8 kg 56.9 kg 55.6 kg    ECG    Sinus tachycardia 09/10/18 - Personally Reviewed  Physical Exam   GEN: No acute distress.   Neck: JVD to the mid neck at 45 deg Cardiac: regular rhythm normal rate, no murmurs, rubs, or gallops.  Respiratory: Clear to auscultation bilaterally. GI: Soft, nontender, non-distended  MS: No edema; No deformity. Neuro:  Nonfocal  Psych: Normal affect,  answers are vague but appropriate    Labs    Chemistry Recent Labs  Lab 09/10/18 0257 09/11/18 0354 09/12/18 0409  NA 141 141 141  K 3.6 3.5 3.8  CL 107 106 106  CO2 24 24 26   GLUCOSE 110* 97 102*  BUN 23 26* 28*  CREATININE 1.99* 2.50* 2.39*  CALCIUM 8.9 8.4* 8.5*  GFRNONAA 21* 16* 17*  GFRAA 24* 18* 19*  ANIONGAP 10 11 9      Hematology Recent Labs  Lab 09/09/18 0841 09/11/18 0354 09/12/18 0409  WBC 6.1 5.0 6.1  RBC 3.35* 2.95* 3.16*  HGB 8.8* 7.7* 8.1*  HCT 27.9* 24.1* 25.6*  MCV 83.3 81.7 81.0  MCH 26.3 26.1 25.6*  MCHC 31.5 32.0 31.6  RDW 18.5* 18.0* 17.5*  PLT 329 275 314    Cardiac Enzymes Recent Labs  Lab 09/09/18 1429 09/09/18 2101 09/10/18 0257  TROPONINI 0.06* 0.10* 0.12*    Recent Labs  Lab 09/09/18 0858 09/09/18 1206  TROPIPOC 0.11* 0.08     BNP Recent Labs  Lab 09/09/18 1419  BNP 676.4*     DDimer  Recent Labs  Lab 09/09/18 0846  DDIMER 1.16*     Radiology    Dg Chest 2 View  Result Date: 09/11/2018 CLINICAL DATA:  Shortness  of breath and chest pain EXAM: CHEST - 2 VIEW COMPARISON:  09/09/2018 FINDINGS: Cardiac shadow is enlarged. Aortic calcifications are seen. Bibasilar infiltrates are noted increased from the prior exam. Stable vascular congestion and interstitial edema is noted. Small bilateral pleural effusions are noted as well. IMPRESSION: Bibasilar infiltrates and effusions with mild vascular congestion. Electronically Signed   By: Inez Catalina M.D.   On: 09/11/2018 12:40   Nm Pulmonary Perf And Vent  Result Date: 09/11/2018 CLINICAL DATA:  Dyspnea.  Elevated D-dimer. EXAM: NUCLEAR MEDICINE VENTILATION - PERFUSION LUNG SCAN TECHNIQUE: Ventilation images were obtained in multiple projections using inhaled aerosol Tc-94m DTPA. Perfusion images were obtained in multiple projections after intravenous injection of Tc-58m-MAA. RADIOPHARMACEUTICALS:  32.6 mCi of Tc-31m DTPA aerosol inhalation and 4.15 mCi Tc30m-MAA IV  COMPARISON:  Chest x-ray dated 09/11/2018. FINDINGS: Ventilation: No focal ventilation defect. Perfusion: No wedge shaped peripheral perfusion defects to suggest acute pulmonary embolism. IMPRESSION: No evidence of pulmonary embolism. Electronically Signed   By: Franki Cabot M.D.   On: 09/11/2018 14:37    Cardiac Studies  Echo 09/09/18 Study Conclusions  - Procedure narrative: Transthoracic echocardiography. Image   quality was adequate. The study was technically difficult, as a   result of poor sound wave transmission. Intravenous contrast   (Definity) was administered. - Left ventricle: The cavity size was normal. Wall thickness was   increased in a pattern of mild LVH. Systolic function was   severely reduced. The estimated ejection fraction was in the   range of 20% to 25%. No apical thrombus with Definity contrast.   Severe apical hypokinesis with preserved basal function,   suggestive of possible Takatsubo cardiomyopathy or multivessel   coronary artery disease. Doppler parameters are consistent with   abnormal left ventricular relaxation (grade 1 diastolic   dysfunction). LV filling pressures are markedly elevated. - Aortic valve: Sclerosis without stenosis. There was mild   regurgitation. Valve area (Vmax): 1.28 cm^2. - Mitral valve: Mildly thickened leaflets . Sclerotic anterior   leaflet. There was mild to moderate regurgitation. - Left atrium: The atrium was normal in size. - Tricuspid valve: There was moderate regurgitation. - Pulmonary arteries: PA peak pressure: 56 mm Hg (S). - Inferior vena cava: The vessel was dilated. The respirophasic   diameter changes were blunted (< 50%), consistent with elevated   central venous pressure. - Pericardium, extracardiac: There was no pericardial effusion.   There was a left pleural effusion.  Impressions:  - Technically difficult study. Definity contrast given. Compared to   a prior study in 2015, the LVEF is markedly lower  at 20-25% with   predominantly mid to distal anterior, apical, inferior and   lateral wall hypokinesis with a preserved basal function,   suggestive of possible Takatsubo cardimyopathy or multivessel   coronary artery disease. LV filling pressure is very high. RVSP   is 56 mmHg with a dilated IVC and left pleural effusion.  Patient Profile     [name] is a 84 y.o. female with a hx of HTN, HLD, DM, anemia of renal disease with CKD (followed by hematology oncology) and PVD s/p left femoral endarterectomy (followed by [facility])  who is being seen for the evaluation of acute CHF at the request of Dr. Denton Brick.   Assessment & Plan    Principal Problem:   Acute coronary syndrome (HCC) Active Problems:   Hypertension   PAD (peripheral artery disease) (HCC)   Anemia in chronic kidney disease  Acute HFrEF (heart failure with reduced ejection fraction) (HCC)   Chronic kidney disease   Acute congestive heart failure ([place])   Palliative care by specialist   DNR (do not resuscitate)   Weakness generalized  Internal Medicine has resumed lasix at a higher dose to effectively diurese, we have discussed this today, and her respiratory status is improving. I/O have not been accurately recorded, we will monitor more closely for diuresis effect in setting of renal dysfunction.   Tolerating carvedilol well, can consider uptitration tomorrow.   No other changes at this time.     For questions or updates, please contact [place] Please consult [email or web address] for contact info under        Signed, Elouise Munroe, MD  09/12/2018, 12:36 PM

## 2018-09-12 NOTE — Consult Note (Signed)
   [facility] CM Inpatient Consult   09/12/2018  [name] [birth year] [id number]    Patient screened for potential [facility] Care Management services due to unplanned readmission risk score of 24% (high).  Went to bedside to speak with [name] and family about [place] Management program. [name] is agreeable and [facility] Management written consent obtained. [facility] folder provided.   [name] granddaughter- [name]- requests that she be contacted post discharge. Also states she will have questions about medication changes and interactions. Agreeable to [place] referral.   [name] is agreeable to her granddaughter being primary contact post hospital discharge. [name] [phone].   [name] lives with family.  Confirmed Primary Care Provider is [name]. Denies having concerns with transportation.  Discussed above notes with inpatient RNCM. Also noted palliative consult was done.  Will await to make referral for [place] and [facility] Pharmacist once disposition plans are clear.  [name] has a history of DM, HLD, HTN, CHF.   Marthenia Rolling, MSN-Ed, RN,BSN [facility] Liaison [phone]

## 2018-09-13 DIAGNOSIS — R0602 Shortness of breath: Secondary | ICD-10-CM

## 2018-09-13 DIAGNOSIS — I10 Essential (primary) hypertension: Secondary | ICD-10-CM

## 2018-09-13 LAB — BASIC METABOLIC PANEL
Anion gap: 10 (ref 5–15)
BUN: 33 mg/dL — AB (ref 8–23)
CHLORIDE: 105 mmol/L (ref 98–111)
CO2: 24 mmol/L (ref 22–32)
CREATININE: 2.73 mg/dL — AB (ref 0.44–1.00)
Calcium: 8.5 mg/dL — ABNORMAL LOW (ref 8.9–10.3)
GFR calc Af Amer: 17 mL/min — ABNORMAL LOW (ref >60)
GFR, EST NON AFRICAN AMERICAN: 14 mL/min — AB (ref >60)
Glucose, Bld: 190 mg/dL — ABNORMAL HIGH (ref 70–99)
Potassium: 3.9 mmol/L (ref 3.5–5.1)
SODIUM: 139 mmol/L (ref 135–145)

## 2018-09-13 LAB — GLUCOSE, CAPILLARY
GLUCOSE-CAPILLARY: 92 mg/dL (ref 70–99)
GLUCOSE-CAPILLARY: 185 mg/dL — AB (ref 70–99)
Glucose-Capillary: 108 mg/dL — ABNORMAL HIGH (ref 70–99)

## 2018-09-13 MED ORDER — CARVEDILOL 12.5 MG PO TABS
12.5000 mg | ORAL_TABLET | Freq: Two times a day (BID) | ORAL | Status: DC
  Administered 2018-09-13: 12.5 mg via ORAL
  Filled 2018-09-13: qty 1

## 2018-09-13 MED ORDER — CARVEDILOL 6.25 MG PO TABS
6.2500 mg | ORAL_TABLET | Freq: Once | ORAL | Status: AC
  Administered 2018-09-13: 6.25 mg via ORAL
  Filled 2018-09-13: qty 1

## 2018-09-13 MED ORDER — ACETAMINOPHEN 325 MG PO TABS: 650.0000 mg | ORAL_TABLET | Freq: Four times a day (QID) | ORAL | 0 refills | Status: DC | PRN

## 2018-09-13 MED ORDER — AMLODIPINE BESYLATE 10 MG PO TABS: 10.0000 mg | ORAL_TABLET | Freq: Every day | ORAL | 1 refills | Status: DC

## 2018-09-13 MED ORDER — GLIPIZIDE 5 MG PO TABS: 2.5000 mg | ORAL_TABLET | Freq: Two times a day (BID) | ORAL | 2 refills | Status: DC

## 2018-09-13 MED ORDER — ASPIRIN EC 81 MG PO TBEC: 81.0000 mg | DELAYED_RELEASE_TABLET | Freq: Every day | ORAL | 2 refills | Status: DC

## 2018-09-13 MED ORDER — CLOPIDOGREL BISULFATE 75 MG PO TABS: 75.0000 mg | ORAL_TABLET | Freq: Every day | ORAL | 2 refills | Status: AC

## 2018-09-13 MED ORDER — ATORVASTATIN CALCIUM 10 MG PO TABS: 10.0000 mg | ORAL_TABLET | Freq: Every day | ORAL | 1 refills | Status: DC

## 2018-09-13 MED ORDER — CARVEDILOL 12.5 MG PO TABS: 12.5000 mg | ORAL_TABLET | Freq: Two times a day (BID) | ORAL | 2 refills | Status: DC

## 2018-09-13 NOTE — Progress Notes (Addendum)
Progress Note  Patient Name: [name] Date of Encounter: 09/13/2018  Primary Cardiologist: Pixie Casino, MD   Subjective    Feeling well today, no acute concerns.   Inpatient Medications    Scheduled Meds: . amLODipine  10 mg Oral Daily  . aspirin EC  81 mg Oral Daily  . atorvastatin  10 mg Oral q1800  . benzonatate  100 mg Oral TID  . carvedilol  6.25 mg Oral BID WC  . clopidogrel  75 mg Oral Q breakfast  . enoxaparin (LOVENOX) injection  30 mg Subcutaneous Q24H  . furosemide  60 mg Intravenous BID  . Influenza vac split quadrivalent PF  0.5 mL Intramuscular Tomorrow-1000  . insulin aspart  0-5 Units Subcutaneous QHS  . insulin aspart  0-9 Units Subcutaneous TID WC  . pantoprazole  20 mg Oral Daily  . sodium chloride flush  3 mL Intravenous Q12H   Continuous Infusions: . sodium chloride     PRN Meds: sodium chloride, acetaminophen **OR** acetaminophen, HYDROcodone-acetaminophen, ondansetron **OR** ondansetron (ZOFRAN) IV, polyethylene glycol, sodium chloride flush   Vital Signs    Vitals:   09/12/18 2134 09/13/18 0331 09/13/18 0405 09/13/18 0800  BP: 112/68  140/78 131/78  Pulse: 76  97   Resp: 18  (!) 22   Temp: (!) 97.4 F (36.3 C)  (!) 97.5 F (36.4 C)   TempSrc: Oral  Oral   SpO2: 93% (!) 88% 94%   Weight:   55.8 kg     Intake/Output Summary (Last 24 hours) at 09/13/2018 1042 Last data filed at 09/13/2018 0848 Gross per 24 hour  Intake 200 ml  Output 400 ml  Net -200 ml   Filed Weights   09/11/18 2440 09/12/18 0552 09/13/18 0405  Weight: 56.9 kg 55.6 kg 55.8 kg   Telemetry    SR, no sig ectopy  ECG    09/10/18 Sinus tach - Personally Reviewed  Physical Exam   General: Well developed, well nourished, elderly female in no acute distress Head: Eyes PERRLA, No xanthomas.   Normocephalic and atraumatic Lungs: few rales bases R>L. Heart: HRRR S1 S2, without MRG.  Pulses are 2+ & equal. JVD 8-9 cm Abdomen: Bowel sounds are  present, abdomen soft and non-tender without masses or  hernias noted. Msk: Normal strength and tone for age. Extremities: No clubbing, cyanosis or edema.    Skin:  No rashes or lesions noted. Neuro: Alert and oriented X 3. Psych:  Good affect, responds appropriately  Labs    Chemistry Recent Labs  Lab 09/10/18 0257 09/11/18 0354 09/12/18 0409  NA 141 141 141  K 3.6 3.5 3.8  CL 107 106 106  CO2 24 24 26   GLUCOSE 110* 97 102*  BUN 23 26* 28*  CREATININE 1.99* 2.50* 2.39*  CALCIUM 8.9 8.4* 8.5*  GFRNONAA 21* 16* 17*  GFRAA 24* 18* 19*  ANIONGAP 10 11 9      Hematology Recent Labs  Lab 09/09/18 0841 09/11/18 0354 09/12/18 0409  WBC 6.1 5.0 6.1  RBC 3.35* 2.95* 3.16*  HGB 8.8* 7.7* 8.1*  HCT 27.9* 24.1* 25.6*  MCV 83.3 81.7 81.0  MCH 26.3 26.1 25.6*  MCHC 31.5 32.0 31.6  RDW 18.5* 18.0* 17.5*  PLT 329 275 314    Cardiac Enzymes Recent Labs  Lab 09/09/18 1429 09/09/18 2101 09/10/18 0257  TROPONINI 0.06* 0.10* 0.12*    Recent Labs  Lab 09/09/18 0858 09/09/18 1206  TROPIPOC 0.11* 0.08     BNP  Recent Labs  Lab 09/09/18 1419  BNP 676.4*     DDimer  Recent Labs  Lab 09/09/18 0846  DDIMER 1.16*     Radiology    Nm Pulmonary Perf And Vent  Result Date: 09/11/2018 CLINICAL DATA:  Dyspnea.  Elevated D-dimer. EXAM: NUCLEAR MEDICINE VENTILATION - PERFUSION LUNG SCAN TECHNIQUE: Ventilation images were obtained in multiple projections using inhaled aerosol Tc-44m DTPA. Perfusion images were obtained in multiple projections after intravenous injection of Tc-73m-MAA. RADIOPHARMACEUTICALS:  32.6 mCi of Tc-10m DTPA aerosol inhalation and 4.15 mCi Tc42m-MAA IV COMPARISON:  Chest x-ray dated 09/11/2018. FINDINGS: Ventilation: No focal ventilation defect. Perfusion: No wedge shaped peripheral perfusion defects to suggest acute pulmonary embolism. IMPRESSION: No evidence of pulmonary embolism. Electronically Signed   By: Franki Cabot M.D.   On: 09/11/2018 14:37     Cardiac Studies  Echo 09/09/18 Study Conclusions  - Procedure narrative: Transthoracic echocardiography. Image   quality was adequate. The study was technically difficult, as a   result of poor sound wave transmission. Intravenous contrast   (Definity) was administered. - Left ventricle: The cavity size was normal. Wall thickness was   increased in a pattern of mild LVH. Systolic function was   severely reduced. The estimated ejection fraction was in the   range of 20% to 25%. No apical thrombus with Definity contrast.   Severe apical hypokinesis with preserved basal function,   suggestive of possible Takatsubo cardiomyopathy or multivessel   coronary artery disease. Doppler parameters are consistent with   abnormal left ventricular relaxation (grade 1 diastolic   dysfunction). LV filling pressures are markedly elevated. - Aortic valve: Sclerosis without stenosis. There was mild   regurgitation. Valve area (Vmax): 1.28 cm^2. - Mitral valve: Mildly thickened leaflets . Sclerotic anterior   leaflet. There was mild to moderate regurgitation. - Left atrium: The atrium was normal in size. - Tricuspid valve: There was moderate regurgitation. - Pulmonary arteries: PA peak pressure: 56 mm Hg (S). - Inferior vena cava: The vessel was dilated. The respirophasic   diameter changes were blunted (< 50%), consistent with elevated   central venous pressure. - Pericardium, extracardiac: There was no pericardial effusion.   There was a left pleural effusion.  Impressions:  - Technically difficult study. Definity contrast given. Compared to   a prior study in 2015, the LVEF is markedly lower at 20-25% with   predominantly mid to distal anterior, apical, inferior and   lateral wall hypokinesis with a preserved basal function,   suggestive of possible Takatsubo cardimyopathy or multivessel   coronary artery disease. LV filling pressure is very high. RVSP   is 56 mmHg with a dilated IVC and  left pleural effusion.  Patient Profile     [name] is a 84 y.o. female with a hx of HTN, HLD, DM, anemia of renal disease with CKD (followed by hematology oncology) and PVD s/p left femoral endarterectomy (followed by [facility])  who is being seen for the evaluation of acute CHF at the request of Dr. Denton Brick.   Assessment & Plan    Principal Problem: 1.  Acute coronary syndrome (HCC) - med rx for presumed CAD - EF decreased, could be Takotsubo CM vs multi-vessel dz - continue ASA, Plavix, BB (increase dose), statin>change to Lipitor 40 mg  - no invasive eval planned  2.  Acute HFrEF (heart failure with reduced ejection fraction) (HCC) - PA pressures elevated on 10/11 echo - Lasix IV increased to 80 mg  bid on 10/12 pm - Cr went up, Lasix decreased to 60 mg IV BID 10/14 - I/O are inaccurate and wt is unchanged - no BMET today, will order - check O2 sats w/ ambulation - not a candidate for advanced therapies - no ACE/ARB/Entresto 2nd poor renal function  Otherwise, per IM Active Problems:   Hypertension   PAD (peripheral artery disease) ([place])   Anemia in chronic kidney disease   Chronic kidney disease   Palliative care by specialist   DNR (do not resuscitate)   Acute congestive heart failure ([place])   Weakness generalized  For questions or updates, please contact [facility] Please consult [email or web address] for contact info under     Signed, Rosaria Ferries, PA-C  09/13/2018, 10:42 AM   ------------------------------------------------------   History and all data above reviewed.  Patient examined.  I agree with the findings as above.  [name] is doing well with plans to be dismissed today.  GEN:No acute distress.   Neck:No significant JVD Cardiac:regular rhythm normal rate, no murmurs, rubs, or gallops.  Respiratory:Clear to auscultation bilaterally. NM:MHWK, nontender, non-distended  MS:No edema; No  deformity. Neuro:Nonfocal  Psych: Normal affect, answers are vague but appropriate    All available labs, radiology testing, previous records reviewed. Agree with documented assessment and plan of my colleague as stated above with the following additions or changes:  Principal Problem:   Acute coronary syndrome ([place]) Active Problems:   Hypertension   PAD (peripheral artery disease) (HCC)   Anemia in chronic kidney disease   Acute HFrEF (heart failure with reduced ejection fraction) (HCC)   Chronic kidney disease   Acute congestive heart failure ([place])   Palliative care by specialist   DNR (do not resuscitate)   Weakness generalized    Plan: agree with the recommendations of my colleague as above. Continue medical therapy for CAD. She appears euvolemic and is breathing comfortably and primary service plans to dismiss home.  Elouise Munroe, MD [facility] 5:57 PM  09/13/2018

## 2018-09-13 NOTE — Discharge Summary (Signed)
[name], is a 84 y.o. female  DOB [birth year]  MRN [id number].  Admission date:  09/09/2018  Admitting Physician  Merton Border, MD  Discharge Date:  09/13/2018   Primary MD  Lucianne Lei, MD  Recommendations for primary care physician for things to follow:   1)Very low-salt diet advised 2)Weigh yourself daily, call if you gain more than 3 pounds in 1 day or more than 5 pounds in 1 week as your diuretic medications may need to be adjusted 3)Limit your Fluid  intake to no more than 60 ounces (1.8 Liters) per day 4)Stop Metformin due to kidney concerns, take Glipizide 2.5 mg twice a day with food instead 5) hold valsartan and HCTZ due to kidney concerns 6) repeat BMP/kidney electrolyte blood test no later than Monday, 09/19/2018 with your PCP, if possible have these blood tests done on Friday, 09/16/2018 7)Follow-up with cardiologist in 1 to 2 weeks 8)Avoid ibuprofen/Advil/Aleve/Motrin/Goody Powders/Naproxen/BC powders/Meloxicam/Diclofenac/Indomethacin and other Nonsteroidal anti-inflammatory medications as these will make you more likely to bleed and can cause stomach ulcers, can also cause Kidney problems.    Admission Diagnosis  Elevated troponin [R79.89] Chronic kidney disease, unspecified CKD stage [N18.9] Acute congestive heart failure, unspecified heart failure type ([place]) [I50.9]   Discharge Diagnosis  Elevated troponin [R79.89] Chronic kidney disease, unspecified CKD stage [N18.9] Acute congestive heart failure, unspecified heart failure type ([place]) [I50.9]    Principal Problem:   Acute coronary syndrome (HCC) Active Problems:   Hypertension   PAD (peripheral artery disease) (HCC)   Anemia in chronic kidney disease   Acute HFrEF (heart failure with reduced ejection fraction) (HCC)   Chronic kidney disease   Acute congestive heart failure ([place])   Palliative care by specialist   DNR  (do not resuscitate)   Weakness generalized      Past Medical History:  Diagnosis Date  . ACS (acute coronary syndrome) ([place]) 08/2018  . Anemia    family unsure of this history  . Arthritis   . Chronic ulcer of calf (HCC)    Left  . Constipation   . Diabetes mellitus without complication ([place])   . Dyslipidemia   . Dyspnea   . GERD (gastroesophageal reflux disease)   . Hypertension   . Peripheral vascular disease [facility]     Past Surgical History:  Procedure Laterality Date  . ABDOMINAL AORTOGRAM W/LOWER EXTREMITY Left 12/27/2017   Procedure: ABDOMINAL AORTOGRAM W/LOWER EXTREMITY;  Surgeon: Algernon Huxley, MD;  Location: [place] [facility];  Service: Cardiovascular;  Laterality: Left;  . ABDOMINAL HYSTERECTOMY    . AMPUTATION TOE Left 12/23/2017   Procedure: AMPUTATION TOE LEFT GREAT TOE;  Surgeon: Sharlotte Alamo, DPM;  Location: [facility] OR[facility];  Service: Podiatry;  Laterality: Left;  . ENDARTERECTOMY FEMORAL Left 09/08/2017   Procedure: ENDARTERECTOMY FEMORAL WITH CORMATRIX PATCH;  Surgeon: Algernon Huxley, MD;  Location: [facility] ORS;  Service: Vascular;  Laterality: Left;  . EYE SURGERY Bilateral    Cataract Extraction with IOL  . LOWER EXTREMITY ANGIOGRAPHY Left 07/22/2017  Procedure: Lower Extremity Angiography;  Surgeon: Algernon Huxley, MD;  Location: [place] [facility];  Service: Cardiovascular;  Laterality: Left;  . LOWER EXTREMITY ANGIOGRAPHY Left 08/16/2017   Procedure: Lower Extremity Angiography;  Surgeon: Algernon Huxley, MD;  Location: [place] [facility];  Service: Cardiovascular;  Laterality: Left;  Marland Kitchen MULTIPLE TOOTH EXTRACTIONS         HPI  from the history and physical done on the day of admission:    HPI  [name]  is a 84 y.o. female, with past medical history significant for hypertension, hyperlipidemia and diabetes mellitus presenting with 2 to 3 days of left-sided chest discomfort with shortness of breath.  According to her family symptoms are worse with exertion.   In the emergency room the patient was noted to be hypoxemic with initial pulse ox on room air at 87%.  Her chest x-ray showed congestive heart failure.  Patient is not a good historian and most of the history was taken from her son at bedside    Hospital Course:       Brief Summary:- 84 year old pleasant female with a history of hypertension, dyslipidemia, type 2 diabetes, stage III chronic kidney disease and peripheral vascular disease status post femoral endarterectomy admitted on 09/09/2018 with shortness of breath chest pains elevated troponin and found to have depressed EF of 20 to 25% compared to 55 to 60% back in 2015  Plan:- 1)HFrEF--- acute systolic dysfunction CHF, EF on echo from 09/09/2018 is 20 to 25%,???? possible multivessel disease Vs Takotsubo cardiomyopathy, cardiology consult appreciated, repeat chest x-ray on 09/11/2018 with bilateral effusions and pulm venous congestion , she was treated with IV Lasix,    with diuresis weight is down to 55.6 kg from 56.9 kg, fluid balance is not accurate,  dyspnea and hypoxia appears to be improving slowly, , ???  Weight accuracy.  Cardiologist recommends medical management without left heart catheterization given tenuous renal situation, avoid ACEI/ARB/ARNI due to renal concerns  2)Elevated Troponin/Chest pain and abnormal EKG--- possible NSTEMI versus Takotsubo cardiomyopathy, continue aspirin 81 mg daily, Lipitor 10 mg daily, and Plavix 75 mg daily as well as Coreg 6.25 mg twice daily  3)AKI----acute kidney injury on CKD stage - III to IV, creatinine is down to 2.39,  , creatinine on admission= 2.0 ,  baseline creatinine = 2.2  , creatinine is up to 2.7   , renally adjust medications, avoid nephrotoxic agents/dehydration/hypotension, avoid ACEI/ARB/ARNI due to renal concerns,  4)Anemia of CKD--- hgb down to 8.1 from 8.8, no evidence of acute blood loss,, she has normochromic and hypochromic   5)PAD-- status post femoral  endarterectomy, c/n aspirin 81 mg daily, Lipitor 10 mg daily, and Plavix 75 mg daily  6)H/o DM2-- last A1C  5.3, allow some permissive hyperglycemia in order to avoid life-threatening hypoglycemia   7)Elevated D-Dimer---  D-Dimer 1.1 (adjusted for age just slightly above expected range), , Vq scan without evidence for  PE  8) acute hypoxic respiratory failure--- secondary to #1 #2 above weaning off oxygen  Code Status : Full  Disposition Plan  :  Home with home health  Consults  : Cardiology   Discharge Condition: Stable/improved  Follow UP ----PCP and cardiology as outpatient,  Diet and Activity recommendation:  As advised  Discharge Instructions    Discharge Instructions    (HEART FAILURE PATIENTS) Call MD:  Anytime you have any of the following symptoms: 1) 3 pound weight gain in 24 hours or 5 pounds in 1 week  2) shortness of breath, with or without a dry hacking cough 3) swelling in the hands, feet or stomach 4) if you have to sleep on extra pillows at night in order to breathe.   Complete by:  As directed    Call MD for:  difficulty breathing, headache or visual disturbances   Complete by:  As directed    Call MD for:  persistant dizziness or light-headedness   Complete by:  As directed    Call MD for:  persistant nausea and vomiting   Complete by:  As directed    Call MD for:  severe uncontrolled pain   Complete by:  As directed    Call MD for:  temperature >100.4   Complete by:  As directed    Diet - low sodium heart healthy   Complete by:  As directed    Diet Carb Modified   Complete by:  As directed    Discharge instructions   Complete by:  As directed    1)Very low-salt diet advised 2)Weigh yourself daily, call if you gain more than 3 pounds in 1 day or more than 5 pounds in 1 week as your diuretic medications may need to be adjusted 3)Limit your Fluid  intake to no more than 60 ounces (1.8 Liters) per day 4)Stop Metformin due to kidney concerns, take  Glipizide 2.5 mg twice a day with food instead 5) hold valsartan and HCTZ due to kidney concerns 6) repeat BMP/kidney electrolyte blood test no later than Monday, 09/19/2018 with your PCP, if possible have these blood tests done on Friday, 09/16/2018 7)Follow-up with cardiologist in 1 to 2 weeks 8)Avoid ibuprofen/Advil/Aleve/Motrin/Goody Powders/Naproxen/BC powders/Meloxicam/Diclofenac/Indomethacin and other Nonsteroidal anti-inflammatory medications as these will make you more likely to bleed and can cause stomach ulcers, can also cause Kidney problems.   Increase activity slowly   Complete by:  As directed         Discharge Medications     Allergies as of 09/13/2018   No Known Allergies     Medication List    STOP taking these medications   azithromycin 250 MG tablet Commonly known as:  ZITHROMAX   metFORMIN 1000 MG tablet Commonly known as:  GLUCOPHAGE   valsartan-hydrochlorothiazide 320-25 MG tablet Commonly known as:  DIOVAN-HCT     TAKE these medications   ACCU-CHEK FASTCLIX LANCETS Misc TEST TID   ACCU-CHEK GUIDE test strip Generic drug:  glucose blood   ACCU-CHEK GUIDE w/Device Kit See admin instructions.   acetaminophen 325 MG tablet Commonly known as:  TYLENOL Take 2 tablets (650 mg total) by mouth every 6 (six) hours as needed for mild pain or headache (or Fever >/= 101).   amLODipine 10 MG tablet Commonly known as:  NORVASC Take 1 tablet (10 mg total) by mouth daily.   aspirin EC 81 MG tablet Take 1 tablet (81 mg total) by mouth daily. With food What changed:  additional instructions   atorvastatin 10 MG tablet Commonly known as:  LIPITOR Take 1 tablet (10 mg total) by mouth daily.   benzonatate 100 MG capsule Commonly known as:  TESSALON Take 100 mg by mouth 3 (three) times daily.   CAL-MAG-ZINC PO Take 1 tablet by mouth daily.   CALCIUM 500 + D 500-125 MG-UNIT Tabs Generic drug:  Calcium Carbonate-Vitamin D Take 1 tablet by mouth  daily.   carvedilol 12.5 MG tablet Commonly known as:  COREG Take 1 tablet (12.5 mg total) by mouth 2 (two) times daily with a  meal. What changed:    medication strength  how much to take  when to take this   citalopram 10 MG tablet Commonly known as:  CELEXA Take 10 mg by mouth daily.   clopidogrel 75 MG tablet Commonly known as:  PLAVIX Take 1 tablet (75 mg total) by mouth daily with breakfast.   glipiZIDE 5 MG tablet Commonly known as:  GLUCOTROL Take 0.5 tablets (2.5 mg total) by mouth 2 (two) times daily before a meal. Always take with food   pantoprazole 20 MG tablet Commonly known as:  PROTONIX Take 1 tablet (20 mg total) by mouth daily.   polyethylene glycol packet Commonly known as:  MIRALAX / GLYCOLAX Take 17 g by mouth daily as needed (for constipation.).   VITAMIN D3 GUMMIES ADULT PO Take 1 tablet by mouth daily.       Major procedures and Radiology Reports - PLEASE review detailed and final reports for all details, in brief -    Dg Chest 2 View  Result Date: 09/11/2018 CLINICAL DATA:  Shortness of breath and chest pain EXAM: CHEST - 2 VIEW COMPARISON:  09/09/2018 FINDINGS: Cardiac shadow is enlarged. Aortic calcifications are seen. Bibasilar infiltrates are noted increased from the prior exam. Stable vascular congestion and interstitial edema is noted. Small bilateral pleural effusions are noted as well. IMPRESSION: Bibasilar infiltrates and effusions with mild vascular congestion. Electronically Signed   By: Inez Catalina M.D.   On: 09/11/2018 12:40   Dg Chest 2 View  Result Date: 09/09/2018 CLINICAL DATA:  Increased sob.Recent blood transfusion EXAM: CHEST - 2 VIEW COMPARISON:  None. FINDINGS: Heart size is enlarged and accentuated by technique. There are airspace filling opacities throughout the central perihilar regions and bilateral LOWER lobes. There are bilateral pleural effusions. There is atherosclerotic calcification of the thoracic aorta.  Degenerative changes are seen in thoracic spine. IMPRESSION: Cardiomegaly and pulmonary edema. Electronically Signed   By: Nolon Nations M.D.   On: 09/09/2018 09:20   Nm Pulmonary Perf And Vent  Result Date: 09/11/2018 CLINICAL DATA:  Dyspnea.  Elevated D-dimer. EXAM: NUCLEAR MEDICINE VENTILATION - PERFUSION LUNG SCAN TECHNIQUE: Ventilation images were obtained in multiple projections using inhaled aerosol Tc-79mDTPA. Perfusion images were obtained in multiple projections after intravenous injection of Tc-933mAA. RADIOPHARMACEUTICALS:  32.6 mCi of Tc-9963mPA aerosol inhalation and 4.15 mCi Tc99m71m IV COMPARISON:  Chest x-ray dated 09/11/2018. FINDINGS: Ventilation: No focal ventilation defect. Perfusion: No wedge shaped peripheral perfusion defects to suggest acute pulmonary embolism. IMPRESSION: No evidence of pulmonary embolism. Electronically Signed   By: StanFranki Cabot.   On: 09/11/2018 14:37   Us [place]omen Limited  Result Date: 08/26/2018 CLINICAL DATA:  89 y31r old female with 1 month of left upper quadrant abdominal pain. Query splenomegaly. EXAM: ULTRASOUND ABDOMEN LIMITED COMPARISON:  CTA abdomen 10/19/2017. FINDINGS: Stable and normal spleen size compared to the 2018 CTA. Estimated splenic volume is 74 milliliters (normal splenic volume range 83 - 412 mL). Splenic length is 7.9 centimeters. Splenic echotexture is within normal limits. Suggestion of a small left pleural effusion. No left upper quadrant free fluid. IMPRESSION: 1. Suggestion of a small left pleural effusion. Consider follow-up chest radiographs. 2. Stable and normal spleen. Electronically Signed   By: H  HGenevie [name].   On: 08/26/2018 16:20    Micro Results    Recent Results (from the past 240 hour(s))  MRSA PCR Screening     Status: None   Collection Time: 09/09/18  2:35 PM  Result Value Ref  Range Status   MRSA by PCR NEGATIVE NEGATIVE Final    Comment:        The GeneXpert MRSA Assay (FDA approved for NASAL  specimens only), is one component of a comprehensive MRSA colonization surveillance program. It is not intended to diagnose MRSA infection nor to guide or monitor treatment for MRSA infections. Performed at [facility], [place] [address] [place], [place] [address]        Today   Subjective    Iva Montelongo today has no new concerns, dyspnea is improved significantly, voiding okay, no new concerns, no chest pains          Patient has been seen and examined prior to discharge   Objective   Blood pressure 107/65, pulse 75, temperature (!) 97.5 F (36.4 C), temperature source Oral, resp. rate 15, weight 55.8 kg, SpO2 99 %.   Intake/Output Summary (Last 24 hours) at 09/13/2018 1552 Last data filed at 09/13/2018 0848 Gross per 24 hour  Intake 200 ml  Output 400 ml  Net -200 ml    Exam Patient is examined daily including today on 09/13/18 , exams remains the same as of yesterday except that has changed   Gen:- Awake Alert, in no acute distress HEENT:- [place].AT, No sclera icterus Neck-Supple Neck, +ve JVD,.  Lungs-improving air movement , no wheezing  CV- S1, S2 normal, regular Abd-  +ve B.Sounds, Abd Soft, No tenderness,    Extremity/Skin:-Warm and dry with good pulses  psych-affect is appropriate, oriented x3 Neuro-no new focal deficits, no tremors   Data Review   CBC w Diff:  Lab Results  Component Value Date   WBC 6.1 09/12/2018   HGB 8.1 (L) 09/12/2018   HGB 8.0 (L) 08/25/2018   HCT 25.6 (L) 09/12/2018   PLT 314 09/12/2018   PLT 260 08/25/2018   LYMPHOPCT 29 08/25/2018   MONOPCT 14 08/25/2018   EOSPCT 3 08/25/2018   BASOPCT 0 08/25/2018    CMP:  Lab Results  Component Value Date   NA 139 09/13/2018   K 3.9 09/13/2018   CL 105 09/13/2018   CO2 24 09/13/2018   BUN 33 (H) 09/13/2018   CREATININE 2.73 (H) 09/13/2018   CREATININE 2.24 (H) 05/25/2018   PROT 8.0 05/25/2018   ALBUMIN 3.1 (L) 05/25/2018   BILITOT 0.3 05/25/2018   ALKPHOS 63  05/25/2018   AST 59 (H) 05/25/2018   ALT 33 05/25/2018  .   Total Discharge time is about 33 minutes  Roxan Hockey M.D on 09/13/2018 at 3:52 PM  Pager---[phone]  Go to [email or web address] - password [email or web address] for contact info  [facility] - Office  [phone]

## 2018-09-13 NOTE — Care Management Important Message (Signed)
Important Message  Patient Details  Name: [name] MRN: [id number] Date of Birth: 1926-08-20   Medicare Important Message Given:  Yes    Aniqua Briere P Atasha [place]bank 09/13/2018, 10:58 AM

## 2018-09-13 NOTE — Discharge Instructions (Signed)
1)Very low-salt diet advised 2)Weigh yourself daily, call if you gain more than 3 pounds in 1 day or more than 5 pounds in 1 week as your diuretic medications may need to be adjusted 3)Limit your Fluid  intake to no more than 60 ounces (1.8 Liters) per day 4)Stop Metformin due to kidney concerns, take Glipizide 2.5 mg twice a day with food instead 5) hold valsartan and HCTZ due to kidney concerns 6) repeat BMP/kidney electrolyte blood test no later than Monday, 09/19/2018 with your PCP, if possible have these blood tests done on Friday, 09/16/2018 7)Follow-up with cardiologist in 1 to 2 weeks 8)Avoid ibuprofen/Advil/Aleve/Motrin/Goody Powders/Naproxen/BC powders/Meloxicam/Diclofenac/Indomethacin and other Nonsteroidal anti-inflammatory medications as these will make you more likely to bleed and can cause stomach ulcers, can also cause Kidney problems.

## 2018-09-13 NOTE — Evaluation (Signed)
Physical Therapy Evaluation Patient Details Name: [name] MRN: [id number] DOB: [birth year] Today's Date: 09/13/2018   History of Present Illness  Patient is a 84 y/o female who presents with SOB and chest pain. Admitted with new onset CHF and possible multivessel disease Vs Takotsubo cardiomyopathy. CXR-Bibasilar infiltrates and effusions with mild vascular congestion. PMH includes dementia, PVD status post femoral endarterectomy admitted on 09/09/2018, HTN, DM.  Clinical Impression  Patient presents with generalized weakness, impaired balance and impaired mobility s/p above. Pt independent PTA and uses RW PRN. Lives with family. Tolerated gait training with min A for balance/safety due to BLE weakness and balance. Sp02 remained in 90s on RA. Reluctant to use RW however discussed this would be the safest option at home. Will follow acutely to maximize independence and mobility prior to return home.     Follow Up Recommendations Home health PT;Supervision for mobility/OOB    Equipment Recommendations  None recommended by PT    Recommendations for Other Services       Precautions / Restrictions Precautions Precautions: Fall Restrictions Weight Bearing Restrictions: No      Mobility  Bed Mobility Overal bed mobility: Needs Assistance Bed Mobility: Supine to Sit     Supine to sit: Modified independent (Device/Increase time);HOB elevated     General bed mobility comments: No assist needed.   Transfers Overall transfer level: Needs assistance Equipment used: None Transfers: Sit to/from Stand Sit to Stand: Min guard         General transfer comment: Min guard for safety.   Ambulation/Gait Ambulation/Gait assistance: Min assist Gait Distance (Feet): 120 Feet Assistive device: None(rail at times) Gait Pattern/deviations: Step-through pattern;Decreased stride length;Trunk flexed;Drifts right/left Gait velocity: decreased   General Gait Details: Slow unsteady gait  with right knee instability, reaching for rail for support. legs giving way towards end of ambulation, Min A for balance. Would benefit from RW even though reluctant  Stairs            Wheelchair Mobility    Modified Rankin (Stroke Patients Only)       Balance Overall balance assessment: Needs assistance Sitting-balance support: Feet supported;No upper extremity supported Sitting balance-Leahy Scale: Good Sitting balance - Comments: Able to donn socks sitting EOB without difficulty.   Standing balance support: During functional activity;Single extremity supported Standing balance-Leahy Scale: Fair Standing balance comment: Able to stand statically wihtout UE support but requires UE support for dynamic tasks.                             Pertinent Vitals/Pain Pain Assessment: No/denies pain    Home Living Family/patient expects to be discharged to:: Private residence Living Arrangements: Children Available Help at Discharge: Available PRN/intermittently;Family Type of Home: House Home Access: Stairs to enter Entrance Stairs-Rails: Right Entrance Stairs-Number of Steps: 2 Home Layout: One level Home Equipment: Shower seat;Grab bars - toilet;Grab bars - tub/shower;Cane - single point;Walker - 2 wheels      Prior Function Level of Independence: Independent with assistive device(s)         Comments: Independent with ADL's, uses a stool to sit on while cooking. Uses RW as needed.     Hand Dominance   Dominant Hand: Right    Extremity/Trunk Assessment   Upper Extremity Assessment Upper Extremity Assessment: Defer to OT evaluation    Lower Extremity Assessment Lower Extremity Assessment: Generalized weakness    Cervical / Trunk Assessment Cervical / Trunk Assessment: Kyphotic  Communication   Communication: No difficulties  Cognition Arousal/Alertness: Awake/alert Behavior During Therapy: WFL for tasks assessed/performed Overall Cognitive  Status: History of cognitive impairments - at baseline                                 General Comments: Hx of dementia. A&Ox3.      General Comments General comments (skin integrity, edema, etc.): Granddaughter present during session    Exercises     Assessment/Plan    PT Assessment Patient needs continued PT services  PT Problem List Decreased strength;Decreased mobility;Decreased safety awareness;Decreased balance;Decreased activity tolerance;Decreased cognition;Decreased knowledge of use of DME       PT Treatment Interventions Functional mobility training;Balance training;Patient/family education;Gait training;Therapeutic activities;Stair training;Therapeutic exercise;DME instruction    PT Goals (Current goals can be found in the Care Plan section)  Acute Rehab PT Goals Patient Stated Goal: to go home PT Goal Formulation: With patient/family Time For Goal Achievement: 09/27/18 Potential to Achieve Goals: Good    Frequency Min 3X/week   Barriers to discharge        Co-evaluation               AM-PAC PT "6 Clicks" Daily Activity  Outcome Measure Difficulty turning over in bed (including adjusting bedclothes, sheets and blankets)?: A Little Difficulty moving from lying on back to sitting on the side of the bed? : A Little Difficulty sitting down on and standing up from a chair with arms (e.g., wheelchair, bedside commode, etc,.)?: None Help needed moving to and from a bed to chair (including a wheelchair)?: A Little Help needed walking in hospital room?: A Little Help needed climbing 3-5 steps with a railing? : A Lot 6 Click Score: 18    End of Session Equipment Utilized During Treatment: Gait belt Activity Tolerance: Patient tolerated treatment well Patient left: in bed;with call bell/phone within reach;with bed alarm set;with family/visitor present Nurse Communication: Mobility status PT Visit Diagnosis: Unsteadiness on feet (R26.81);Muscle  weakness (generalized) (M62.81)    Time: 2800-3491 PT Time Calculation (min) (ACUTE ONLY): 21 min   Charges:   PT Evaluation $PT Eval Low Complexity: 1 Low          Wray Kearns, PT, DPT Acute Rehabilitation Services Pager [phone] Office [place] 09/13/2018, 9:06 AM

## 2018-09-14 ENCOUNTER — Other Ambulatory Visit: Payer: Self-pay | Admitting: *Deleted

## 2018-09-15 ENCOUNTER — Other Ambulatory Visit: Payer: Self-pay

## 2018-09-15 ENCOUNTER — Emergency Department: Payer: Medicare Other

## 2018-09-15 ENCOUNTER — Inpatient Hospital Stay
Admission: EM | Admit: 2018-09-15 | Discharge: 2018-09-17 | DRG: 291 | Disposition: A | Discharge status: Home Health | Payer: Medicare Other | Attending: Specialist | Admitting: Specialist

## 2018-09-15 ENCOUNTER — Encounter: Payer: Self-pay | Admitting: Emergency Medicine

## 2018-09-15 DIAGNOSIS — I110 Hypertensive heart disease with heart failure: Secondary | ICD-10-CM | POA: Diagnosis not present

## 2018-09-15 DIAGNOSIS — Z79899 Other long term (current) drug therapy: Secondary | ICD-10-CM

## 2018-09-15 DIAGNOSIS — I130 Hypertensive heart and chronic kidney disease with heart failure and stage 1 through stage 4 chronic kidney disease, or unspecified chronic kidney disease: Principal | ICD-10-CM | POA: Diagnosis present

## 2018-09-15 DIAGNOSIS — I5023 Acute on chronic systolic (congestive) heart failure: Secondary | ICD-10-CM

## 2018-09-15 DIAGNOSIS — Z7902 Long term (current) use of antithrombotics/antiplatelets: Secondary | ICD-10-CM

## 2018-09-15 DIAGNOSIS — E785 Hyperlipidemia, unspecified: Secondary | ICD-10-CM | POA: Diagnosis not present

## 2018-09-15 DIAGNOSIS — Z7984 Long term (current) use of oral hypoglycemic drugs: Secondary | ICD-10-CM | POA: Diagnosis not present

## 2018-09-15 DIAGNOSIS — Z23 Encounter for immunization: Secondary | ICD-10-CM

## 2018-09-15 DIAGNOSIS — E119 Type 2 diabetes mellitus without complications: Secondary | ICD-10-CM

## 2018-09-15 DIAGNOSIS — Z89412 Acquired absence of left great toe: Secondary | ICD-10-CM | POA: Diagnosis not present

## 2018-09-15 DIAGNOSIS — E1122 Type 2 diabetes mellitus with diabetic chronic kidney disease: Secondary | ICD-10-CM | POA: Diagnosis not present

## 2018-09-15 DIAGNOSIS — I509 Heart failure, unspecified: Secondary | ICD-10-CM

## 2018-09-15 DIAGNOSIS — Z66 Do not resuscitate: Secondary | ICD-10-CM | POA: Diagnosis present

## 2018-09-15 DIAGNOSIS — R0600 Dyspnea, unspecified: Secondary | ICD-10-CM | POA: Diagnosis not present

## 2018-09-15 DIAGNOSIS — D631 Anemia in chronic kidney disease: Secondary | ICD-10-CM | POA: Diagnosis not present

## 2018-09-15 DIAGNOSIS — M1990 Unspecified osteoarthritis, unspecified site: Secondary | ICD-10-CM | POA: Diagnosis present

## 2018-09-15 DIAGNOSIS — Z8679 Personal history of other diseases of the circulatory system: Secondary | ICD-10-CM | POA: Diagnosis not present

## 2018-09-15 DIAGNOSIS — Z7189 Other specified counseling: Secondary | ICD-10-CM | POA: Diagnosis not present

## 2018-09-15 DIAGNOSIS — Z8249 Family history of ischemic heart disease and other diseases of the circulatory system: Secondary | ICD-10-CM | POA: Diagnosis not present

## 2018-09-15 DIAGNOSIS — J811 Chronic pulmonary edema: Secondary | ICD-10-CM | POA: Diagnosis not present

## 2018-09-15 DIAGNOSIS — I255 Ischemic cardiomyopathy: Secondary | ICD-10-CM | POA: Diagnosis not present

## 2018-09-15 DIAGNOSIS — J90 Pleural effusion, not elsewhere classified: Secondary | ICD-10-CM | POA: Diagnosis not present

## 2018-09-15 DIAGNOSIS — K219 Gastro-esophageal reflux disease without esophagitis: Secondary | ICD-10-CM | POA: Diagnosis not present

## 2018-09-15 DIAGNOSIS — Z90710 Acquired absence of both cervix and uterus: Secondary | ICD-10-CM

## 2018-09-15 DIAGNOSIS — N184 Chronic kidney disease, stage 4 (severe): Secondary | ICD-10-CM | POA: Diagnosis not present

## 2018-09-15 DIAGNOSIS — R0902 Hypoxemia: Secondary | ICD-10-CM | POA: Diagnosis present

## 2018-09-15 DIAGNOSIS — Z7982 Long term (current) use of aspirin: Secondary | ICD-10-CM | POA: Diagnosis not present

## 2018-09-15 DIAGNOSIS — Z515 Encounter for palliative care: Secondary | ICD-10-CM | POA: Diagnosis not present

## 2018-09-15 DIAGNOSIS — E1151 Type 2 diabetes mellitus with diabetic peripheral angiopathy without gangrene: Secondary | ICD-10-CM | POA: Diagnosis present

## 2018-09-15 DIAGNOSIS — R0602 Shortness of breath: Secondary | ICD-10-CM | POA: Diagnosis not present

## 2018-09-15 DIAGNOSIS — I10 Essential (primary) hypertension: Secondary | ICD-10-CM | POA: Diagnosis not present

## 2018-09-15 HISTORY — DX: Heart failure, unspecified: I50.9

## 2018-09-15 LAB — CBC WITH DIFFERENTIAL/PLATELET
Abs Immature Granulocytes: 0.02 10*3/uL (ref 0.00–0.07)
BASOS ABS: 0 10*3/uL (ref 0.0–0.1)
Basophils Relative: 0 %
EOS ABS: 0.1 10*3/uL (ref 0.0–0.5)
EOS PCT: 2 %
HCT: 25.8 % — ABNORMAL LOW (ref 36.0–46.0)
HEMOGLOBIN: 8.2 g/dL — AB (ref 12.0–15.0)
IMMATURE GRANULOCYTES: 0 %
LYMPHS ABS: 1.4 10*3/uL (ref 0.7–4.0)
LYMPHS PCT: 25 %
MCH: 25.9 pg — ABNORMAL LOW (ref 26.0–34.0)
MCHC: 31.8 g/dL (ref 30.0–36.0)
MCV: 81.6 fL (ref 80.0–100.0)
MONOS PCT: 10 %
Monocytes Absolute: 0.6 10*3/uL (ref 0.1–1.0)
Neutro Abs: 3.6 10*3/uL (ref 1.7–7.7)
Neutrophils Relative %: 63 %
Platelets: 324 10*3/uL (ref 150–400)
RBC: 3.16 MIL/uL — ABNORMAL LOW (ref 3.87–5.11)
RDW: 17.6 % — AB (ref 11.5–15.5)
WBC: 5.7 10*3/uL (ref 4.0–10.5)
nRBC: 0 % (ref 0.0–0.2)

## 2018-09-15 LAB — CBC
HCT: 26.6 % — ABNORMAL LOW (ref 36.0–46.0)
Hemoglobin: 8.4 g/dL — ABNORMAL LOW (ref 12.0–15.0)
MCH: 26.3 pg (ref 26.0–34.0)
MCHC: 31.6 g/dL (ref 30.0–36.0)
MCV: 83.1 fL (ref 80.0–100.0)
PLATELETS: 308 10*3/uL (ref 150–400)
RBC: 3.2 MIL/uL — AB (ref 3.87–5.11)
RDW: 18.6 % — ABNORMAL HIGH (ref 11.5–15.5)
WBC: 5 10*3/uL (ref 4.0–10.5)
nRBC: 0 % (ref 0.0–0.2)

## 2018-09-15 LAB — BASIC METABOLIC PANEL
ANION GAP: 13 (ref 5–15)
BUN: 40 mg/dL — ABNORMAL HIGH (ref 8–23)
CALCIUM: 8.5 mg/dL — AB (ref 8.9–10.3)
CO2: 24 mmol/L (ref 22–32)
CREATININE: 2.54 mg/dL — AB (ref 0.44–1.00)
Chloride: 101 mmol/L (ref 98–111)
GFR calc Af Amer: 18 mL/min — ABNORMAL LOW (ref >60)
GFR calc non Af Amer: 16 mL/min — ABNORMAL LOW (ref >60)
Glucose, Bld: 138 mg/dL — ABNORMAL HIGH (ref 70–99)
POTASSIUM: 3.7 mmol/L (ref 3.5–5.1)
Sodium: 138 mmol/L (ref 135–145)

## 2018-09-15 LAB — TROPONIN I
TROPONIN I: 0.07 ng/mL — AB (ref <0.03)
TROPONIN I: 0.08 ng/mL — AB (ref <0.03)
Troponin I: 0.04 ng/mL (ref <0.03)
Troponin I: 0.06 ng/mL (ref <0.03)

## 2018-09-15 LAB — CREATININE, SERUM
CREATININE: 2.33 mg/dL — AB (ref 0.44–1.00)
GFR calc Af Amer: 20 mL/min — ABNORMAL LOW (ref >60)
GFR calc non Af Amer: 17 mL/min — ABNORMAL LOW (ref >60)

## 2018-09-15 LAB — BRAIN NATRIURETIC PEPTIDE: B NATRIURETIC PEPTIDE 5: 398 pg/mL — AB (ref 0.0–100.0)

## 2018-09-15 MED ORDER — INSULIN ASPART 100 UNIT/ML ~~LOC~~ SOLN
0.0000 [IU] | Freq: Three times a day (TID) | SUBCUTANEOUS | Status: DC
  Administered 2018-09-16 (×2): 2 [IU] via SUBCUTANEOUS
  Filled 2018-09-15 (×3): qty 1

## 2018-09-15 MED ORDER — BENZONATATE 100 MG PO CAPS
100.0000 mg | ORAL_CAPSULE | Freq: Three times a day (TID) | ORAL | Status: DC
  Administered 2018-09-15 – 2018-09-17 (×5): 100 mg via ORAL
  Filled 2018-09-15 (×8): qty 1

## 2018-09-15 MED ORDER — FUROSEMIDE 10 MG/ML IJ SOLN
20.0000 mg | Freq: Two times a day (BID) | INTRAMUSCULAR | Status: DC
  Administered 2018-09-16 – 2018-09-17 (×3): 20 mg via INTRAVENOUS
  Filled 2018-09-15 (×3): qty 2

## 2018-09-15 MED ORDER — FUROSEMIDE 10 MG/ML IJ SOLN
40.0000 mg | Freq: Once | INTRAMUSCULAR | Status: AC
  Administered 2018-09-15: 40 mg via INTRAVENOUS
  Filled 2018-09-15: qty 4

## 2018-09-15 MED ORDER — HEPARIN SODIUM (PORCINE) 5000 UNIT/ML IJ SOLN
5000.0000 [IU] | Freq: Three times a day (TID) | INTRAMUSCULAR | Status: DC
  Administered 2018-09-15 – 2018-09-17 (×6): 5000 [IU] via SUBCUTANEOUS
  Filled 2018-09-15 (×8): qty 1

## 2018-09-15 MED ORDER — ATORVASTATIN CALCIUM 10 MG PO TABS
10.0000 mg | ORAL_TABLET | Freq: Every day | ORAL | Status: DC
  Administered 2018-09-15 – 2018-09-17 (×3): 10 mg via ORAL
  Filled 2018-09-15 (×4): qty 1

## 2018-09-15 MED ORDER — ACETAMINOPHEN 325 MG PO TABS
650.0000 mg | ORAL_TABLET | Freq: Four times a day (QID) | ORAL | Status: DC | PRN
  Administered 2018-09-17: 650 mg via ORAL
  Filled 2018-09-15: qty 2

## 2018-09-15 MED ORDER — ASPIRIN EC 81 MG PO TBEC
81.0000 mg | DELAYED_RELEASE_TABLET | Freq: Every day | ORAL | Status: DC
  Administered 2018-09-15 – 2018-09-17 (×3): 81 mg via ORAL
  Filled 2018-09-15 (×4): qty 1

## 2018-09-15 MED ORDER — POLYETHYLENE GLYCOL 3350 17 G PO PACK
17.0000 g | PACK | Freq: Every day | ORAL | Status: DC | PRN
  Filled 2018-09-15: qty 1

## 2018-09-15 MED ORDER — AMLODIPINE BESYLATE 10 MG PO TABS
10.0000 mg | ORAL_TABLET | Freq: Every day | ORAL | Status: DC
  Administered 2018-09-15: 10 mg via ORAL
  Filled 2018-09-15: qty 2
  Filled 2018-09-15: qty 1

## 2018-09-15 MED ORDER — DOCUSATE SODIUM 100 MG PO CAPS: 100.0000 mg | ORAL_CAPSULE | Freq: Two times a day (BID) | ORAL | Status: DC | PRN

## 2018-09-15 MED ORDER — CITALOPRAM HYDROBROMIDE 20 MG PO TABS
10.0000 mg | ORAL_TABLET | Freq: Every day | ORAL | Status: DC
  Administered 2018-09-15 – 2018-09-17 (×2): 10 mg via ORAL
  Filled 2018-09-15 (×4): qty 1

## 2018-09-15 MED ORDER — GLIPIZIDE 5 MG PO TABS
2.5000 mg | ORAL_TABLET | Freq: Two times a day (BID) | ORAL | Status: DC
  Administered 2018-09-16 – 2018-09-17 (×3): 2.5 mg via ORAL
  Filled 2018-09-15 (×5): qty 0.5

## 2018-09-15 MED ORDER — CARVEDILOL 12.5 MG PO TABS
12.5000 mg | ORAL_TABLET | Freq: Two times a day (BID) | ORAL | Status: DC
  Administered 2018-09-15 – 2018-09-17 (×4): 12.5 mg via ORAL
  Filled 2018-09-15 (×5): qty 1
  Filled 2018-09-15: qty 2

## 2018-09-15 MED ORDER — CALCIUM CARBONATE-VITAMIN D 500-200 MG-UNIT PO TABS
1.0000 | ORAL_TABLET | Freq: Every day | ORAL | Status: DC
  Administered 2018-09-15 – 2018-09-17 (×3): 1 via ORAL
  Filled 2018-09-15 (×4): qty 1

## 2018-09-15 MED ORDER — PANTOPRAZOLE SODIUM 20 MG PO TBEC
20.0000 mg | DELAYED_RELEASE_TABLET | Freq: Every day | ORAL | Status: DC
  Administered 2018-09-16 – 2018-09-17 (×2): 20 mg via ORAL
  Filled 2018-09-15 (×3): qty 1

## 2018-09-15 MED ORDER — CLOPIDOGREL BISULFATE 75 MG PO TABS
75.0000 mg | ORAL_TABLET | Freq: Every day | ORAL | Status: DC
  Administered 2018-09-16 – 2018-09-17 (×2): 75 mg via ORAL
  Filled 2018-09-15 (×2): qty 1

## 2018-09-15 NOTE — ED Triage Notes (Signed)
PT arrived with family with complaints of shortness of breath that started last Friday. Pt was recently diagnosed with heart failure and was admitted to the hospital but pt denies every improving. Pt appears in NAD in triage and denies pain. PT's breathing equal and unlabored. 92% on room air.

## 2018-09-15 NOTE — Plan of Care (Signed)

## 2018-09-15 NOTE — ED Notes (Signed)
Date and time results received: 09/15/18 1:03 PM   Test: Troponin Critical Value: 0.06  Name of Provider Notified: Vachhanni  Orders Received? Or Actions Taken?: None

## 2018-09-15 NOTE — Progress Notes (Signed)
Pt was at [facility] for CHF d/c 2 days ago, no lasix due to renal failure concern. Had Chest pain and SOB last night. Xray noted Interstitial edema.  Admit for Ac on Ch systolic CHF. IV lasix/ Spoke to Cardiology PA- Gerald Stabs.  Palliative care consult. Full h & P to follow.

## 2018-09-15 NOTE — ED Notes (Signed)
Dr Reita Cliche notified in person of Trop 0.04.

## 2018-09-15 NOTE — H&P (Signed)
[facility] NAME: [name]    MR#:  [id number]  DATE OF BIRTH:  01/28/1926  DATE OF ADMISSION:  09/15/2018  PRIMARY CARE PHYSICIAN: Lucianne Lei, MD   REQUESTING/REFERRING PHYSICIAN: Reita Cliche  CHIEF COMPLAINT:   Chief Complaint  Patient presents with  . Shortness of Breath    HISTORY OF PRESENT ILLNESS: [name]  is a 84 y.o. female with a known history of acute coronary syndrome, anemia, arthritis, systolic congestive heart failure, constipation, diabetes, dyslipidemia, hypertension, peripheral vascular disease-was admitted to [facility] for acute on chronic systolic congestive heart failure and also had slight worsening in the renal function so her Lasix dose was adjusted and finally discharged home 2 days ago. She was advised not to take Lasix at the time of discharge as per family. Last night patient could not sleep because of her left-sided chest pain and her left-sided jaw pain, along with shortness of breath.  She denies any leg edema.  Concerned with this family decided to bring her to our hospital emergency room as in [facility] is long ER waiting. On work-up in ER she was noted to have slight worsening of pulmonary edema.  And she was also hypoxic in ER.  PAST MEDICAL HISTORY:   Past Medical History:  Diagnosis Date  . ACS (acute coronary syndrome) ([place]) 08/2018  . Anemia    family unsure of this history  . Arthritis   . CHF (congestive heart failure) ([place])   . Chronic ulcer of calf (HCC)    Left  . Constipation   . Diabetes mellitus without complication ([place])   . Dyslipidemia   . Dyspnea   . GERD (gastroesophageal reflux disease)   . Hypertension   . Peripheral vascular disease ([place])     PAST SURGICAL HISTORY:  Past Surgical History:  Procedure Laterality Date  . ABDOMINAL AORTOGRAM W/LOWER EXTREMITY Left 12/27/2017   Procedure: ABDOMINAL AORTOGRAM W/LOWER EXTREMITY;  Surgeon:  Algernon Huxley, MD;  Location: [facility];  Service: Cardiovascular;  Laterality: Left;  . ABDOMINAL HYSTERECTOMY    . AMPUTATION TOE Left 12/23/2017   Procedure: AMPUTATION TOE LEFT GREAT TOE;  Surgeon: Sharlotte Alamo, DPM;  Location: [facility];  Service: Podiatry;  Laterality: Left;  . ENDARTERECTOMY FEMORAL Left 09/08/2017   Procedure: ENDARTERECTOMY FEMORAL WITH CORMATRIX PATCH;  Surgeon: Algernon Huxley, MD;  Location: [facility];  Service: Vascular;  Laterality: Left;  . EYE SURGERY Bilateral    Cataract Extraction with IOL  . LOWER EXTREMITY ANGIOGRAPHY Left 07/22/2017   Procedure: Lower Extremity Angiography;  Surgeon: Algernon Huxley, MD;  Location: [facility];  Service: Cardiovascular;  Laterality: Left;  . LOWER EXTREMITY ANGIOGRAPHY Left 08/16/2017   Procedure: Lower Extremity Angiography;  Surgeon: Algernon Huxley, MD;  Location: [facility];  Service: Cardiovascular;  Laterality: Left;  Marland Kitchen MULTIPLE TOOTH EXTRACTIONS      SOCIAL HISTORY:  Social History   Tobacco Use  . Smoking status: Never Smoker  . Smokeless tobacco: Never Used  Substance Use Topics  . Alcohol use: No    FAMILY HISTORY:  Family History  Problem Relation Age of Onset  . Hypertension Father     DRUG ALLERGIES: No Known Allergies  REVIEW OF SYSTEMS:   CONSTITUTIONAL: No fever, fatigue or weakness.  EYES: No blurred or double vision.  EARS, NOSE, AND THROAT: No tinnitus or ear pain.  RESPIRATORY: No cough, have  shortness of breath, no wheezing or hemoptysis.  CARDIOVASCULAR: Left-sided chest pain, orthopnea, no edema.  GASTROINTESTINAL: No nausea, vomiting, diarrhea or abdominal pain.  GENITOURINARY: No dysuria, hematuria.  ENDOCRINE: No polyuria, nocturia,  HEMATOLOGY: No anemia, easy bruising or bleeding SKIN: No rash or lesion. MUSCULOSKELETAL: No joint pain or arthritis.   NEUROLOGIC: No tingling, numbness, weakness.  PSYCHIATRY: No anxiety or depression.   MEDICATIONS AT HOME:   Prior to Admission medications   Medication Sig Start Date End Date Taking? Authorizing Provider  ACCU-CHEK FASTCLIX LANCETS MISC TEST TID 12/31/17  Yes [provider]  ACCU-CHEK GUIDE test strip  12/31/17  Yes [provider]  acetaminophen (TYLENOL) 325 MG tablet Take 2 tablets (650 mg total) by mouth every 6 (six) hours as needed for mild pain or headache (or Fever >/= 101). 09/13/18  Yes Emokpae, Courage, MD  amLODipine (NORVASC) 10 MG tablet Take 1 tablet (10 mg total) by mouth daily. 09/13/18  Yes Roxan Hockey, MD  aspirin EC 81 MG tablet Take 1 tablet (81 mg total) by mouth daily. With food 09/13/18  Yes Emokpae, Courage, MD  atorvastatin (LIPITOR) 10 MG tablet Take 1 tablet (10 mg total) by mouth daily. 09/13/18  Yes Emokpae, Courage, MD  Calcium Carbonate-Vitamin D (CALCIUM 500 + D) 500-125 MG-UNIT TABS Take 1 tablet by mouth daily.    Yes [provider]  carvedilol (COREG) 12.5 MG tablet Take 1 tablet (12.5 mg total) by mouth 2 (two) times daily with a meal. 09/13/18  Yes Emokpae, Courage, MD  Cholecalciferol (VITAMIN D3 GUMMIES ADULT PO) Take 1 tablet by mouth daily.   Yes [provider]  citalopram (CELEXA) 10 MG tablet Take 10 mg by mouth daily.  05/12/18  Yes [provider]  clopidogrel (PLAVIX) 75 MG tablet Take 1 tablet (75 mg total) by mouth daily with breakfast. 09/13/18  Yes Emokpae, Courage, MD  glipiZIDE (GLUCOTROL) 5 MG tablet Take 0.5 tablets (2.5 mg total) by mouth 2 (two) times daily before a meal. Always take with food 09/13/18  Yes Emokpae, Courage, MD  pantoprazole (PROTONIX) 20 MG tablet Take 1 tablet (20 mg total) by mouth daily. 07/13/14  Yes Kindl, Nelda Severe, MD  polyethylene glycol (MIRALAX / GLYCOLAX) packet Take 17 g by mouth daily as needed (for constipation.).    Yes [provider]  benzonatate (TESSALON) 100 MG capsule Take 100 mg by mouth 3 (three) times daily. 09/06/18   [provider]  Blood  Glucose Monitoring Suppl (ACCU-CHEK GUIDE) w/Device KIT See admin instructions. 12/28/17   [provider]  Calcium-Magnesium-Zinc (CAL-MAG-ZINC PO) Take 1 tablet by mouth daily.    [provider]      PHYSICAL EXAMINATION:   VITAL SIGNS: Blood pressure (!) 94/54, pulse 60, temperature 98.2 F (36.8 C), resp. rate 18, height _0  (1.676 m), weight 55.1 kg, SpO2 99 %.  GENERAL:  84 y.o.-year-old patient lying in the bed with no acute distress.  EYES: Pupils equal, round, reactive to light and accommodation. No scleral icterus. Extraocular muscles intact.  HEENT: Head atraumatic, normocephalic. Oropharynx and nasopharynx clear.  NECK:  Supple, no jugular venous distention. No thyroid enlargement, no tenderness.  LUNGS: Normal breath sounds bilaterally, no wheezing, some crepitation. No use of accessory muscles of respiration.  CARDIOVASCULAR: S1, S2 normal. No murmurs, rubs, or gallops.  ABDOMEN: Soft, nontender, nondistended. Bowel sounds present. No organomegaly or mass.  EXTREMITIES: No pedal edema, cyanosis, or clubbing.  NEUROLOGIC: Cranial nerves II through XII  are intact. Muscle strength 4/5 in all extremities. Sensation intact. Gait not checked.  PSYCHIATRIC: The patient is alert and oriented x 3.  SKIN: No obvious rash, lesion, or ulcer.   LABORATORY PANEL:   CBC Recent Labs  Lab 09/09/18 0841 09/11/18 0354 09/12/18 0409 09/15/18 0738 09/15/18 1509  WBC 6.1 5.0 6.1 5.7 5.0  HGB 8.8* 7.7* 8.1* 8.2* 8.4*  HCT 27.9* 24.1* 25.6* 25.8* 26.6*  PLT 329 275 314 324 308  MCV 83.3 81.7 81.0 81.6 83.1  MCH 26.3 26.1 25.6* 25.9* 26.3  MCHC 31.5 32.0 31.6 31.8 31.6  RDW 18.5* 18.0* 17.5* 17.6* 18.6*  LYMPHSABS  --   --   --  1.4  --   MONOABS  --   --   --  0.6  --   EOSABS  --   --   --  0.1  --   BASOSABS  --   --   --  0.0  --     ------------------------------------------------------------------------------------------------------------------  Chemistries  Recent Labs  Lab 09/10/18 0257 09/11/18 0354 09/12/18 0409 09/13/18 1130 09/15/18 0738 09/15/18 1509  NA 141 141 141 139 138  --   K 3.6 3.5 3.8 3.9 3.7  --   CL 107 106 106 105 101  --   CO2 _0 --   GLUCOSE 110* 97 102* 190* 138*  --   BUN 23 26* 28* 33* 40*  --   CREATININE 1.99* 2.50* 2.39* 2.73* 2.54* 2.33*  CALCIUM 8.9 8.4* 8.5* 8.5* 8.5*  --    ------------------------------------------------------------------------------------------------------------------ estimated creatinine clearance is 14 mL/min (A) (by C-G formula based on SCr of 2.33 mg/dL (H)). ------------------------------------------------------------------------------------------------------------------ No results for input(s): TSH, T4TOTAL, T3FREE, THYROIDAB in the last 72 hours.  Invalid input(s): FREET3   Coagulation profile No results for input(s): INR, PROTIME in the last 168 hours. ------------------------------------------------------------------------------------------------------------------- No results for input(s): DDIMER in the last 72 hours. -------------------------------------------------------------------------------------------------------------------  Cardiac Enzymes Recent Labs  Lab 09/15/18 1205 09/15/18 1509 09/15/18 1838  TROPONINI 0.06* 0.07* 0.08*   ------------------------------------------------------------------------------------------------------------------ Invalid input(s): POCBNP  ---------------------------------------------------------------------------------------------------------------  Urinalysis    Component Value Date/Time   COLORURINE STRAW (A) 12/23/2017 2020   APPEARANCEUR CLEAR (A) 12/23/2017 2020   LABSPEC 1.009 12/23/2017 2020   PHURINE 7.0 12/23/2017 2020   GLUCOSEU NEGATIVE 12/23/2017 2020   HGBUR  NEGATIVE 12/23/2017 2020   BILIRUBINUR NEGATIVE 12/23/2017 2020   KETONESUR NEGATIVE 12/23/2017 2020   PROTEINUR NEGATIVE 12/23/2017 2020   UROBILINOGEN 1.0 09/07/2016 1403   NITRITE NEGATIVE 12/23/2017 2020   LEUKOCYTESUR NEGATIVE 12/23/2017 2020     RADIOLOGY: Dg Chest 2 View  Result Date: 09/15/2018 CLINICAL DATA:  Shortness of breath began 6 days ago. Recently diagnosed with CHF. Persistent symptoms. EXAM: CHEST - 2 VIEW COMPARISON:  PA and lateral chest x-ray of September 11, 2018 FINDINGS: The lungs are well-expanded. There small bilateral pleural effusions. The interstitial markings are increased. Patchy airspace opacity previously demonstrated at the lung bases has improved. The cardiac silhouette remains enlarged. The pulmonary vascularity is engorged. There is calcification in the wall of the thoracic aorta. IMPRESSION: CHF with interstitial edema. Patchy airspace opacity in the right infrahilar region is present. There are small bilateral pleural effusions which are slightly less conspicuous today. Thoracic aortic atherosclerosis. Electronically Signed   By: David  Martinique M.D.   On: 09/15/2018 08:50    EKG: Orders placed or performed in visit on 09/15/18  . EKG 12-Lead    IMPRESSION AND PLAN:  *  Acute on chronic systolic congestive heart failure Recent admission to [facility]. IV Lasix, may need low-dose oral Lasix on discharge. Monitor intake and output and renal function closely. Called cardiology consult.  *Chronic kidney disease stage IV Monitor renal function closely while giving IV Lasix.  *Anemia due to chronic kidney disease  Monitor while in hospital.  *Hypertension Continue amlodipine and carvedilol. Also on Lasix.  *Diabetes Continue glipizide Keep on sliding scale coverage.  *Hyperlipidemia Continue atorvastatin.    All the records are reviewed and case discussed with ED provider. Management plans discussed with the patient, family and  they are in agreement.  CODE STATUS: DNR    Code Status Orders  (From admission, onward)         Start     Ordered   09/15/18 1448  Do not attempt resuscitation (DNR)  Continuous    Question Answer Comment  In the event of cardiac or respiratory ARREST Do not call a "code blue"   In the event of cardiac or respiratory ARREST Do not perform Intubation, CPR, defibrillation or ACLS   In the event of cardiac or respiratory ARREST Use medication by any route, position, wound care, and other measures to relive pain and suffering. May use oxygen, suction and manual treatment of airway obstruction as needed for comfort.      09/15/18 1447        Code Status History    Date Active Date Inactive Code Status Order ID Comments User Context   09/11/2018 1025 09/13/2018 2049 DNR [id number]  Knox Royalty, NP Inpatient   09/09/2018 1414 09/11/2018 1025 Full Code [id number]  Merton Border, MD Inpatient   12/24/2017 0020 12/28/2017 1456 Full Code [id number]  Epifanio Lesches, MD Inpatient   12/23/2017 1542 12/24/2017 0020 Full Code [id number]  Epifanio Lesches, MD ED   09/08/2017 1226 09/10/2017 1635 Full Code [id number]  Algernon Huxley, MD Inpatient   08/16/2017 1452 08/16/2017 1920 Full Code [id number]  Algernon Huxley, MD Inpatient   07/22/2017 1107 07/22/2017 1610 Full Code [id number]  Algernon Huxley, MD Inpatient   12/31/2013 1623 01/01/2014 1923 Full Code [id number]  Murlean Iba, MD Inpatient    Advance Directive Documentation     Most Recent Value  Type of Advance Directive  Healthcare Power of Attorney  Pre-existing out of facility DNR order (yellow form or pink MOST form)  -  "MOST" Form in Place?  -       TOTAL TIME TAKING CARE OF THIS PATIENT: 45 minutes.  Discussed with patient's granddaughter who was present in the room during my visit and with cardiologist PA.  Vaughan Basta M.D on 09/15/2018   Between 7am to 6pm - Pager - [phone]  After 6pm go to [email or web address] -  password [email or web address] [facility]  Office  [phone]  CC: Primary care physician; Lucianne Lei, MD   Note: This dictation was prepared with Dragon dictation along with smaller phrase technology. Any transcriptional errors that result from this process are unintentional.

## 2018-09-15 NOTE — Plan of Care (Signed)
  Problem: Education: Goal: Ability to demonstrate management of disease process will improve Outcome: Progressing Goal: Ability to verbalize understanding of medication therapies will improve Outcome: Progressing   Problem: Activity: Goal: Capacity to carry out activities will improve Outcome: Progressing   

## 2018-09-15 NOTE — Progress Notes (Signed)
Family Meeting Note  Advance Directive:yes  Today a meeting took place with the Patient and grand daughter.   The following clinical team members were present during this meeting:MD  The following were discussed:Patient's diagnosis: Systolic CHF, DM, Htn, CKD stage 4 , Patient's progosis: Unable to determine and Goals for treatment: DNR  Additional follow-up to be provided: cardiology  Time spent during discussion:20 minutes  Vaughan Basta, MD

## 2018-09-15 NOTE — ED Notes (Signed)
Report to Erica, RN

## 2018-09-15 NOTE — ED Provider Notes (Signed)
[facility] Emergency Department Provider Note ____________________________________________   I have reviewed the triage vital signs and the triage nursing note.  HISTORY  Chief Complaint Shortness of Breath   Historian Patient  HPI [name] is a 84 y.o. female with a history of CHF, recently in the hospital for an acute congestive heart failure exacerbation and release just a few days ago presenting back with persistent dyspnea, very short of breath with walking short distances and worse mostly at night when she is trying to lay flat.  No new cough or sputum production.  No fever.  No lower extremity edema.  Symptoms are moderate.     Past Medical History:  Diagnosis Date  . ACS (acute coronary syndrome) ([place]) 08/2018  . Anemia    family unsure of this history  . Arthritis   . Chronic ulcer of calf (HCC)    Left  . Constipation   . Diabetes mellitus without complication ([place])   . Dyslipidemia   . Dyspnea   . GERD (gastroesophageal reflux disease)   . Hypertension   . Peripheral vascular disease [facility])     Patient Active Problem List   Diagnosis Date Noted  . Acute congestive heart failure ([place])   . Palliative care by specialist   . DNR (do not resuscitate)   . Weakness generalized   . Acute HFrEF (heart failure with reduced ejection fraction) ([place]) 09/10/2018  . Chronic kidney disease 09/10/2018  . Acute coronary syndrome ([place]) 09/09/2018  . Anemia in chronic kidney disease 08/25/2018  . Gangrene of toe of left foot ([place]) 12/23/2017  . PAD (peripheral artery disease) ([place]) 09/21/2017  . Atherosclerosis of artery of extremity with ulceration ([place]) 09/08/2017  . Atherosclerosis of native arteries of the extremities with ulceration ([place]) 06/25/2017  . Stroke ([place]) 12/31/2013  . TIA (transient ischemic attack) 12/31/2013  . Acute ischemic stroke ([place]) 12/31/2013  . ARF (acute renal failure) ([place]) 12/31/2013  . Hypertension   .  Diabetes mellitus without complication ([place])   . Dyslipidemia   . Constipation   . Anemia     Past Surgical History:  Procedure Laterality Date  . ABDOMINAL AORTOGRAM W/LOWER EXTREMITY Left 12/27/2017   Procedure: ABDOMINAL AORTOGRAM W/LOWER EXTREMITY;  Surgeon: Algernon Huxley, MD;  Location: [place] [facility];  Service: Cardiovascular;  Laterality: Left;  . ABDOMINAL HYSTERECTOMY    . AMPUTATION TOE Left 12/23/2017   Procedure: AMPUTATION TOE LEFT GREAT TOE;  Surgeon: Sharlotte Alamo, DPM;  Location: [facility] ORS;  Service: Podiatry;  Laterality: Left;  . ENDARTERECTOMY FEMORAL Left 09/08/2017   Procedure: ENDARTERECTOMY FEMORAL WITH CORMATRIX PATCH;  Surgeon: Algernon Huxley, MD;  Location: [facility] OR[facility];  Service: Vascular;  Laterality: Left;  . EYE SURGERY Bilateral    Cataract Extraction with IOL  . LOWER EXTREMITY ANGIOGRAPHY Left 07/22/2017   Procedure: Lower Extremity Angiography;  Surgeon: Algernon Huxley, MD;  Location: [place] [facility];  Service: Cardiovascular;  Laterality: Left;  . LOWER EXTREMITY ANGIOGRAPHY Left 08/16/2017   Procedure: Lower Extremity Angiography;  Surgeon: Algernon Huxley, MD;  Location: [place] [facility];  Service: Cardiovascular;  Laterality: Left;  Marland Kitchen MULTIPLE TOOTH EXTRACTIONS      Prior to Admission medications   Medication Sig Start Date End Date Taking? Authorizing Provider  ACCU-CHEK FASTCLIX LANCETS MISC TEST TID 12/31/17   [provider]  ACCU-CHEK GUIDE test strip  12/31/17   [provider]  acetaminophen (TYLENOL) 325 MG tablet Take 2  tablets (650 mg total) by mouth every 6 (six) hours as needed for mild pain or headache (or Fever >/= 101). 09/13/18   Roxan Hockey, MD  amLODipine (NORVASC) 10 MG tablet Take 1 tablet (10 mg total) by mouth daily. 09/13/18   Roxan Hockey, MD  aspirin EC 81 MG tablet Take 1 tablet (81 mg total) by mouth daily. With food 09/13/18   Roxan Hockey, MD  atorvastatin (LIPITOR) 10 MG tablet Take 1 tablet (10  mg total) by mouth daily. 09/13/18   Roxan Hockey, MD  benzonatate (TESSALON) 100 MG capsule Take 100 mg by mouth 3 (three) times daily. 09/06/18   [provider]  Blood Glucose Monitoring Suppl (ACCU-CHEK GUIDE) w/Device KIT See admin instructions. 12/28/17   [provider]  Calcium Carbonate-Vitamin D (CALCIUM 500 + D) 500-125 MG-UNIT TABS Take 1 tablet by mouth daily.     [provider]  Calcium-Magnesium-Zinc (CAL-MAG-ZINC PO) Take 1 tablet by mouth daily.    [provider]  carvedilol (COREG) 12.5 MG tablet Take 1 tablet (12.5 mg total) by mouth 2 (two) times daily with a meal. 09/13/18   Emokpae, Courage, MD  Cholecalciferol (VITAMIN D3 GUMMIES ADULT PO) Take 1 tablet by mouth daily.    [provider]  citalopram (CELEXA) 10 MG tablet Take 10 mg by mouth daily.  05/12/18   [provider]  clopidogrel (PLAVIX) 75 MG tablet Take 1 tablet (75 mg total) by mouth daily with breakfast. 09/13/18   Denton Brick, Courage, MD  glipiZIDE (GLUCOTROL) 5 MG tablet Take 0.5 tablets (2.5 mg total) by mouth 2 (two) times daily before a meal. Always take with food 09/13/18   Roxan Hockey, MD  pantoprazole (PROTONIX) 20 MG tablet Take 1 tablet (20 mg total) by mouth daily. 07/13/14   Billy Fischer, MD  polyethylene glycol (MIRALAX / [name]) packet Take 17 g by mouth daily as needed (for constipation.).     [provider]    No Known Allergies  No family history on file.  Social History Social History   Tobacco Use  . Smoking status: Never Smoker  . Smokeless tobacco: Never Used  Substance Use Topics  . Alcohol use: No  . Drug use: No    Review of Systems  Constitutional: Negative for fever. Eyes: Negative for visual changes. ENT: Negative for sore throat. Cardiovascular: Negative for chest pain. Respiratory: Positive for shortness of breath. Gastrointestinal: Negative for abdominal pain, vomiting and  diarrhea. Genitourinary: Negative for dysuria. Musculoskeletal: Negative for back pain. Skin: Negative for rash. Neurological: Negative for headache.  ____________________________________________   PHYSICAL EXAM:  VITAL SIGNS: ED Triage Vitals  Enc Vitals Group     BP 09/15/18 0701 134/69     Pulse Rate 09/15/18 0701 85     Resp 09/15/18 0701 18     Temp 09/15/18 0701 (!) 96.2 F (35.7 C)     Temp Source 09/15/18 0701 Oral     SpO2 09/15/18 0701 92 %     Weight 09/15/18 0703 123 lb (55.8 kg)     Height 09/15/18 0703 5' 6.5" (1.689 m)     Head Circumference --      Peak Flow --      Pain Score 09/15/18 0659 0     Pain Loc --      Pain Edu? --      Excl. in [place]? --      Constitutional: Alert and oriented.  HEENT  Head: Normocephalic and atraumatic.      Eyes: Conjunctivae are normal. Pupils equal and round.       Ears:         Nose: No congestion/rhinnorhea.      Mouth/Throat: Mucous membranes are moist.      Neck: No stridor. Cardiovascular/Chest: Normal rate, regular rhythm.  No murmurs, rubs, or gallops. Respiratory: Normal respiratory effort without tachypnea nor retractions. Breath sounds are clear and equal bilaterally. No wheezes/rales/rhonchi. Gastrointestinal: Soft. No distention, no guarding, no rebound. Nontender.    Genitourinary/rectal:Deferred Musculoskeletal: Nontender with normal range of motion in all extremities. No joint effusions.  No lower extremity tenderness.  No edema. Neurologic:  Normal speech and language. No gross or focal neurologic deficits are appreciated. Skin:  Skin is warm, dry and intact. No rash noted. Psychiatric: Mood and affect are normal. Speech and behavior are normal. Patient exhibits appropriate insight and judgment.   ____________________________________________  LABS (pertinent positives/negatives) I, Lisa Roca, MD the attending physician have reviewed the labs noted below.  Labs Reviewed  BASIC METABOLIC PANEL -  Abnormal; Notable for the following components:      Result Value   Glucose, Bld 138 (*)    BUN 40 (*)    Creatinine, Ser 2.54 (*)    Calcium 8.5 (*)    GFR calc non Af Amer 16 (*)    GFR calc Af Amer 18 (*)    All other components within normal limits  CBC WITH DIFFERENTIAL/PLATELET - Abnormal; Notable for the following components:   RBC 3.16 (*)    Hemoglobin 8.2 (*)    HCT 25.8 (*)    MCH 25.9 (*)    RDW 17.6 (*)    All other components within normal limits  TROPONIN I - Abnormal; Notable for the following components:   Troponin I 0.04 (*)    All other components within normal limits  BRAIN NATRIURETIC PEPTIDE - Abnormal; Notable for the following components:   B Natriuretic Peptide 398.0 (*)    All other components within normal limits    ____________________________________________    EKG I, Lisa Roca, MD, the attending physician have personally viewed and interpreted all ECGs.  89 bpm. Normal sinus rhythm.  Narrow QS per normal axis.  T waves inverted inferiorly and laterally.  Similar to prior EKG. ____________________________________________  RADIOLOGY   Chest x-ray two-view, viewed by myself, radiologist interpretation reviewed:  IMPRESSION: CHF with interstitial edema. Patchy airspace opacity in the right infrahilar region is present. There are small bilateral pleural effusions which are slightly less conspicuous today.  Thoracic aortic atherosclerosis. __________________________________________  PROCEDURES  Procedure(s) performed: None  Procedures  Critical Care performed: None   ____________________________________________  ED COURSE / ASSESSMENT AND PLAN  Pertinent labs & imaging results that were available during my care of the patient were reviewed by me and considered in my medical decision making (see chart for details).    Hypoxic to 86% on room air, on 2 L 88%, on 3 L 93%.  Suspect cause CHF exacerbation.  I reviewed recent  imaging from last hospitalization, just a few days ago, VQ scan was negative.  Per discharge summary and medication list, does not appear that she was sent home on Lasix.  I am to start IV Lasix here.  Troponin 0 0.04, previously slightly more elevated than that.  She is not complaining of chest pain.  WBC not elevated, not suspicious of pneumonia.  CXR most consistent with edema.    Is  going to need hospitalization for hypoxia associated with CHF exacerbation.   CONSULTATIONS: Hospitalist for admission.   Patient / Family / Caregiver informed of clinical course, medical decision-making process, and agree with plan.    ___________________________________________   FINAL CLINICAL IMPRESSION(S) / ED DIAGNOSES   Final diagnoses:  Hypoxia  Dyspnea, unspecified type  Acute congestive heart failure, unspecified heart failure type ([place])      ___________________________________________         Note: This dictation was prepared with Dragon dictation. Any transcriptional errors that result from this process are unintentional    Lisa Roca, MD 09/15/18 [phone]

## 2018-09-15 NOTE — Consult Note (Signed)
Cardiology Consultation:   Patient ID: YELINA SARRATT MRN: [id number]; DOB: 1925/12/18  Admit date: 09/15/2018 Date of Consult: 09/15/2018  Primary Care Provider: Lucianne Lei, MD Primary Cardiologist: Pixie Casino, MD  Primary Electrophysiologist:  None    Patient Profile:   [name] is a 84 y.o. 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 Anselm Jungling.  History of Present Illness:   [name] was recently hospitalized 09/09/18 with cardiology consulted 09/10/18 and after presenting to [facility] in [place] with acute SOB and hypoxia. Workup revealed HFrEF (EF to 20-25%), and upon review of echo, wall motion abnormalities thought to be possibly consistent with multi-vessel disease rather than Takotsubo CM based on PMH. Labs revealed minimal, flat trending elevated troponin, thought to be more consistent with demand ischemia in setting of CHF rather than ACS and with no patient report of CP. She underwent diuresis with some improvement in symptoms. Lasix dosage was increased despite renal insufficiency, as JVD elevated despite decreased weight, and renal insufficiency thought to be at least partially d/t cardiorenal syndrome. IV Lasix increased to aggressive diuresis at 30m BID with associated bump in Cr, followed by a decrease in Lasix dosage to 675mIV. Per documentation at that time, she was not thought to be a candidate for advanced therapies or invasive ischemic evaluation given her age and comorbidities (including renal failure); Cardiac catheterization was not performed and plan discussed with family for medical therapy only. She was discharged 09/13/18 with  plan to continue medical therapy on ASA, Plavix, increased dosage of Coreg & Lipitor 4080mShe was noted to not receive an ACE/ARB/Entresto 2/2 renal insufficiency. If HR continued to be elevated, it was noted that Bidil could be considered to optimize medical therapy.   [facility] 10[facility]17 [facility]. Patient is a poor historian, and stated that she woke in the middle of the night "after a weird dream and didn't feel right/felt sick," which she was unable to clarify despite many attempts / follow-up questions regarding the symptoms that brought her into the ED today. Further discussion with her son, present at that time, clarified the patient presented d/t fatigue and SOB.  In the ED, she was found to be hypoxic to 86% ORA -  improving on 2L O2 to 88% then on 3L O2 to 93%. VQ scan performed earlier in the week (in Epic) and negative for PE. Vitals: BP 134/69, HR 85, RR 18, T 96.9F, SpO2 92% ORA Labs: glucose 138, BUN 40, Cr 2.54, Ca 8.5, RBC 3.16, Hgb 8.2 Troponin 0.04 without CP  BNP 398.0 (compared with 10/11 676.4) EKG: NSR 89 bpm, TWI in inferior and lateral leads, consistent with [place] EKG CXR: CHF with interstitial edema, patchy airspace opacity in R infrahilar region. Small b/l pleural effusions. Thoracic ao atherosclerosis Meds: IV lasix started  *Of note, patient's son was with her during today's exam and expressed concern regarding her EPO injection leading to HF as side effects of the medication included SOB. Son also mentioned concern regarding patient constipation.    Past Medical History:  Diagnosis Date  . ACS (acute coronary syndrome) ([facility]0/2019  . Anemia  family unsure of this history  . Arthritis   . CHF (congestive heart failure) ([place])   . Chronic ulcer of calf (HCC)    Left  . Constipation   . Diabetes mellitus without complication ([place])   . Dyslipidemia   . Dyspnea   .  GERD (gastroesophageal reflux disease)   . Hypertension   . Peripheral vascular disease [facility])     Past Surgical History:  Procedure Laterality Date  . ABDOMINAL AORTOGRAM W/LOWER EXTREMITY Left 12/27/2017   Procedure: ABDOMINAL AORTOGRAM W/LOWER EXTREMITY;  Surgeon: Algernon Huxley, MD;  Location: [place] CV LAB;  Service: Cardiovascular;  Laterality: Left;  . ABDOMINAL HYSTERECTOMY    . AMPUTATION TOE Left 12/23/2017   Procedure: AMPUTATION TOE LEFT GREAT TOE;  Surgeon: Sharlotte Alamo, DPM;  Location: [facility] ORS;  Service: Podiatry;  Laterality: Left;  . ENDARTERECTOMY FEMORAL Left 09/08/2017   Procedure: ENDARTERECTOMY FEMORAL WITH CORMATRIX PATCH;  Surgeon: Algernon Huxley, MD;  Location: [facility] ORS;  Service: Vascular;  Laterality: Left;  . EYE SURGERY Bilateral    Cataract Extraction with IOL  . LOWER EXTREMITY ANGIOGRAPHY Left 07/22/2017   Procedure: Lower Extremity Angiography;  Surgeon: Algernon Huxley, MD;  Location: [place] CV LAB;  Service: Cardiovascular;  Laterality: Left;  . LOWER EXTREMITY ANGIOGRAPHY Left 08/16/2017   Procedure: Lower Extremity Angiography;  Surgeon: Algernon Huxley, MD;  Location: [place] CV LAB;  Service: Cardiovascular;  Laterality: Left;  Marland Kitchen MULTIPLE TOOTH EXTRACTIONS       Home Medications:  Prior to Admission medications   Medication Sig Start Date End Date Taking? Authorizing Provider  ACCU-CHEK FASTCLIX LANCETS MISC TEST TID 12/31/17  Yes [provider]  ACCU-CHEK GUIDE test strip  12/31/17  Yes [provider]  acetaminophen (TYLENOL) 325 MG tablet Take 2 tablets (650 mg total) by mouth every 6 (six) hours as needed for mild pain or headache (or Fever >/= 101). 09/13/18  Yes Emokpae, Courage, MD  amLODipine (NORVASC) 10 MG tablet Take 1 tablet (10 mg total) by mouth daily. 09/13/18  Yes Roxan Hockey, MD  aspirin EC 81 MG tablet Take 1 tablet (81 mg total) by mouth daily. With food 09/13/18  Yes Emokpae, Courage, MD  atorvastatin  (LIPITOR) 10 MG tablet Take 1 tablet (10 mg total) by mouth daily. 09/13/18  Yes Emokpae, Courage, MD  Calcium Carbonate-Vitamin D (CALCIUM 500 + D) 500-125 MG-UNIT TABS Take 1 tablet by mouth daily.    Yes [provider]  carvedilol (COREG) 12.5 MG tablet Take 1 tablet (12.5 mg total) by mouth 2 (two) times daily with a meal. 09/13/18  Yes Emokpae, Courage, MD  Cholecalciferol (VITAMIN D3 GUMMIES ADULT PO) Take 1 tablet by mouth daily.   Yes [provider]  citalopram (CELEXA) 10 MG tablet Take 10 mg by mouth daily.  05/12/18  Yes [provider]  clopidogrel (PLAVIX) 75 MG tablet Take 1 tablet (75 mg total) by mouth daily with breakfast. 09/13/18  Yes Emokpae, Courage, MD  glipiZIDE (GLUCOTROL) 5 MG tablet Take 0.5 tablets (2.5 mg total) by mouth 2 (two) times daily before a meal. Always take with food 09/13/18  Yes Emokpae, Courage, MD  pantoprazole (PROTONIX) 20 MG tablet Take 1 tablet (20 mg total) by mouth daily. 07/13/14  Yes Kindl, Nelda Severe, MD  polyethylene glycol (MIRALAX / GLYCOLAX) packet Take 17 g by mouth daily as needed (for constipation.).    Yes [provider]  benzonatate (TESSALON) 100 MG capsule Take  100 mg by mouth 3 (three) times daily. 09/06/18   [provider]  Blood Glucose Monitoring Suppl (ACCU-CHEK GUIDE) w/Device KIT See admin instructions. 12/28/17   [provider]  Calcium-Magnesium-Zinc (CAL-MAG-ZINC PO) Take 1 tablet by mouth daily.    [provider]    Inpatient Medications: Scheduled Meds: . furosemide  20 mg Intravenous Q12H   Continuous Infusions:  PRN Meds:   Allergies:   No Known Allergies  Social History:   Social History   Socioeconomic History  . Marital status: Legally Separated    Spouse name: Not on file  . Number of children: Not on file  . Years of education: Not on file  . Highest education level: Not on file  Occupational History  . Not on file  Social Needs  .  Financial resource strain: Not on file  . Food insecurity:    Worry: Not on file    Inability: Not on file  . Transportation needs:    Medical: Not on file    Non-medical: Not on file  Tobacco Use  . Smoking status: Never Smoker  . Smokeless tobacco: Never Used  Substance and Sexual Activity  . Alcohol use: No  . Drug use: No  . Sexual activity: Not Currently  Lifestyle  . Physical activity:    Days per week: Not on file    Minutes per session: Not on file  . Stress: Not on file  Relationships  . Social connections:    Talks on phone: Not on file    Gets together: Not on file    Attends religious service: Not on file    Active member of club or organization: Not on file    Attends meetings of clubs or organizations: Not on file    Relationship status: Not on file  . Intimate partner violence:    Fear of current or ex partner: Not on file    Emotionally abused: Not on file    Physically abused: Not on file    Forced sexual activity: Not on file  Other Topics Concern  . Not on file  Social History Narrative  . Not on file    Family History:   No new reported family history  ROS:  Please see the history of present illness.   All other ROS reviewed and negative.     Physical Exam/Data:   Vitals:   09/15/18 0915 09/15/18 0930 09/15/18 0945 09/15/18 1137  BP:    135/74  Pulse: 76 75 79 76  Resp: (!) 24 17 16    Temp:      TempSrc:      SpO2: 90% 91% 96% 95%  Weight:      Height:        Intake/Output Summary (Last 24 hours) at 09/15/2018 1217 Last data filed at 09/15/2018 1130 Gross per 24 hour  Intake -  Output 600 ml  Net -600 ml   Filed Weights   09/15/18 0703  Weight: 55.8 kg   Body mass index is 19.56 kg/m.  General:  Elderly, frail, very thin AA female. Joined by her son  HEENT: normal Neck: distended neck veins, JVP ~8cm Cardiac:  normal S1, S2; RRR; 1/6 systolic murmur appreciated Lungs:  CTAB Abd: soft, nontender Ext: 1+ edema noted at  b/l tibia  Musculoskeletal:  No deformities Skin: warm and dry  Neuro:  no focal abnormalities noted; confused at times  Psych:  Normal affect   EKG: Refer to HPI  CV  Studies:   Relevant CV Studies: Echo 09/09/18 Study Conclusions - Procedure narrative: Transthoracic echocardiography. Image quality was adequate. The study was technically difficult, as a result of poor sound wave transmission. Intravenous contrast (Definity) was administered. - Left ventricle: The cavity size was normal. Wall thickness was increased in a pattern of mild LVH. Systolic function was severely reduced. The estimated ejection fraction was in the range of 20% to 25%. No apical thrombus with Definity contrast. Severe apical hypokinesis with preserved basal function, suggestive of possible Takatsubo cardiomyopathy or multivessel coronary artery disease. Doppler parameters are consistent with abnormal left ventricular relaxation (grade 1 diastolic dysfunction). LV filling pressures are markedly elevated. - Aortic valve: Sclerosis without stenosis. There was mild regurgitation. Valve area (Vmax): 1.28 cm^2. - Mitral valve: Mildly thickened leaflets . Sclerotic anterior leaflet. There was mild to moderate regurgitation. - Left atrium: The atrium was normal in size. - Tricuspid valve: There was moderate regurgitation. - Pulmonary arteries: PA peak pressure: 56 mm Hg (S). - Inferior vena cava: The vessel was dilated. The respirophasic diameter changes were blunted (<50%), consistent with elevated central venous pressure. - Pericardium, extracardiac: There was no pericardial effusion. There was a left pleural effusion. Impressions: - Technically difficult study. Definity contrast given. Compared to a prior study in 2015, the LVEF is markedly lower at 20-25% with predominantly mid to distal anterior, apical, inferior and lateral wall hypokinesis with a preserved basal  function, suggestive of possible Takatsubo cardimyopathy or multivessel coronary artery disease. LV filling pressure is very high. RVSP is 56 mmHg with a dilated IVC and left pleural effusion.  Laboratory Data:  Chemistry Recent Labs  Lab 09/12/18 0409 09/13/18 1130 09/15/18 0738  NA 141 139 138  K 3.8 3.9 3.7  CL 106 105 101  CO2 26 24 24   GLUCOSE 102* 190* 138*  BUN 28* 33* 40*  CREATININE 2.39* 2.73* 2.54*  CALCIUM 8.5* 8.5* 8.5*  GFRNONAA 17* 14* 16*  GFRAA 19* 17* 18*  ANIONGAP 9 10 13     No results for input(s): PROT, ALBUMIN, AST, ALT, ALKPHOS, BILITOT in the last 168 hours. Hematology Recent Labs  Lab 09/11/18 0354 09/12/18 0409 09/15/18 0738  WBC 5.0 6.1 5.7  RBC 2.95* 3.16* 3.16*  HGB 7.7* 8.1* 8.2*  HCT 24.1* 25.6* 25.8*  MCV 81.7 81.0 81.6  MCH 26.1 25.6* 25.9*  MCHC 32.0 31.6 31.8  RDW 18.0* 17.5* 17.6*  PLT 275 314 324   Cardiac Enzymes Recent Labs  Lab 09/09/18 1429 09/09/18 2101 09/10/18 0257 09/15/18 0738  TROPONINI 0.06* 0.10* 0.12* 0.04*    Recent Labs  Lab 09/09/18 0858 09/09/18 1206  TROPIPOC 0.11* 0.08    BNP Recent Labs  Lab 09/09/18 1419 09/15/18 0738  BNP 676.4* 398.0*    DDimer  Recent Labs  Lab 09/09/18 0846  DDIMER 1.16*    Radiology/Studies:  Dg Chest 2 View  Result Date: 09/15/2018 CLINICAL DATA:  Shortness of breath began 6 days ago. Recently diagnosed with CHF. Persistent symptoms. EXAM: CHEST - 2 VIEW COMPARISON:  PA and lateral chest x-ray of September 11, 2018 FINDINGS: The lungs are well-expanded. There small bilateral pleural effusions. The interstitial markings are increased. Patchy airspace opacity previously demonstrated at the lung bases has improved. The cardiac silhouette remains enlarged. The pulmonary vascularity is engorged. There is calcification in the wall of the thoracic aorta. IMPRESSION: CHF with interstitial edema. Patchy airspace opacity in the right infrahilar region is present. There  are small bilateral pleural effusions which are  slightly less conspicuous today. Thoracic aortic atherosclerosis. Electronically Signed   By: David  Martinique M.D.   On: 09/15/2018 08:50    Assessment and Plan:   Acute HFrEF (EF 20-25%, 08/2018) - 10/11-10/15: Admission at [place], with newly depressed LV function vs. EF in 2015. See above HPI / CV studies for further details. Per documentation, treatment plan for medical therapy only at discharge as advanced ischemic workup / therapy was not recommended given patient age, renal insufficiency, and comorbidities. No ACE/ARB given d/t CKD.  - 10/17: 2 days s/p discharge - continued SOB. 10/17 EKG with TWI in inferior /lateral leads. Troponin minimally elevated but decreased when compared to [facility] admission earlier in week (10/12, Troponin 0.12). 10/17 Troponin 0.04  0.06  0.07. No chest pain reported. Elevated Tn likely d/t demand ischemia in setting of acute HFrEF rather than ACS. Reasonable to continue heparin subcutaneous injection at this time. BNP 398, down from previous BNP of 676.4 at [facility] as above.  - Continue IV lasix 12m q12, started in ED. Recommend cautious diuresis, given renal function.Daily BMET to monitor renal function and electrolytes. Daily I/O, daily weights. Fluid restriction. - [facility] Coreg 12.5 mg BID and titrate as needed for HR and BP control. Continue home Plavix 761mwith first dose tomorrow AM 10/18. Recommend continue ASA 8120maily. Continue Lipitor 33m65mily. Consider holding amlodipine 33mg63mHR and BP allow. Continue [place] for SOB. CXR as above in HPI - Daily BMET, Daily CBC. Continue telemetry. - Consider insertion of PICC line for hemodynamic monitoring and further evaluation of volume overload and also evaluation of hypoxia as will be able to assess central venous oxygen saturation (ScvO2). Pending MD to see patient. - Would recommend  [facility] & ensure patient aware of appointment and with transportation.  SOB with Hypoxia (SpO2 86%), improved with nasal cannula - 10/13 NM Pulmonary Perf and Vent study with no focal ventilation defect and not consistent with pulmonary embolism. CXR as above. - On [place] oxygen. Continue to monitor O2 saturation, vitals - Consider further evaluation with insertion of PICC with ScvO2 as directly above and pending MD to see patient  CKD III/IV with associated anemia of chronic disease - Followed by hematology and oncology.  - Labile creatinine - Cr (10/13) 2.50  (10/14) 2.39  (10/15) 2.73  (10/17) 2.54  2.33  - Daily BMET. Caution with diuresis. Close monitoring of renal function and electrolytes. Avoid nephrotoxic medications / procedures as above. No ACE/ARB/ARNI - Per IM  HTN - BP 122/69, HR 69 - Continue medical therapy with antihypertensives as above and titrate as needed for HR/BP. Monitor vitals  HLD - Continue statin as above - Last LDL 12/2013 45 and at goal. Consider updating LDL now or with outpatient labs.  - LDL goal <70  DM2 - SSI - Per IM  PVD - Per IM   For questions or updates, please contact [facility] consult [email or web address] for contact info under     Signed, JacquArvil ChacoC  09/15/2018 12:17 PM

## 2018-09-15 NOTE — ED Notes (Signed)
Pt off unit for xray

## 2018-09-16 ENCOUNTER — Ambulatory Visit: Payer: Self-pay | Admitting: Pharmacist

## 2018-09-16 ENCOUNTER — Observation Stay: Payer: Medicare Other

## 2018-09-16 DIAGNOSIS — I5023 Acute on chronic systolic (congestive) heart failure: Secondary | ICD-10-CM

## 2018-09-16 DIAGNOSIS — M1990 Unspecified osteoarthritis, unspecified site: Secondary | ICD-10-CM | POA: Diagnosis present

## 2018-09-16 DIAGNOSIS — I255 Ischemic cardiomyopathy: Secondary | ICD-10-CM | POA: Diagnosis present

## 2018-09-16 DIAGNOSIS — Z90710 Acquired absence of both cervix and uterus: Secondary | ICD-10-CM | POA: Diagnosis not present

## 2018-09-16 DIAGNOSIS — Z7984 Long term (current) use of oral hypoglycemic drugs: Secondary | ICD-10-CM | POA: Diagnosis not present

## 2018-09-16 DIAGNOSIS — Z515 Encounter for palliative care: Secondary | ICD-10-CM | POA: Diagnosis not present

## 2018-09-16 DIAGNOSIS — D631 Anemia in chronic kidney disease: Secondary | ICD-10-CM

## 2018-09-16 DIAGNOSIS — Z7902 Long term (current) use of antithrombotics/antiplatelets: Secondary | ICD-10-CM | POA: Diagnosis not present

## 2018-09-16 DIAGNOSIS — E785 Hyperlipidemia, unspecified: Secondary | ICD-10-CM | POA: Diagnosis present

## 2018-09-16 DIAGNOSIS — I509 Heart failure, unspecified: Secondary | ICD-10-CM

## 2018-09-16 DIAGNOSIS — Z23 Encounter for immunization: Secondary | ICD-10-CM | POA: Diagnosis not present

## 2018-09-16 DIAGNOSIS — K219 Gastro-esophageal reflux disease without esophagitis: Secondary | ICD-10-CM | POA: Diagnosis present

## 2018-09-16 DIAGNOSIS — R0600 Dyspnea, unspecified: Secondary | ICD-10-CM | POA: Diagnosis not present

## 2018-09-16 DIAGNOSIS — E1151 Type 2 diabetes mellitus with diabetic peripheral angiopathy without gangrene: Secondary | ICD-10-CM | POA: Diagnosis present

## 2018-09-16 DIAGNOSIS — R0602 Shortness of breath: Secondary | ICD-10-CM | POA: Diagnosis present

## 2018-09-16 DIAGNOSIS — Z8249 Family history of ischemic heart disease and other diseases of the circulatory system: Secondary | ICD-10-CM | POA: Diagnosis not present

## 2018-09-16 DIAGNOSIS — N184 Chronic kidney disease, stage 4 (severe): Secondary | ICD-10-CM | POA: Diagnosis present

## 2018-09-16 DIAGNOSIS — Z7189 Other specified counseling: Secondary | ICD-10-CM

## 2018-09-16 DIAGNOSIS — Z66 Do not resuscitate: Secondary | ICD-10-CM | POA: Diagnosis present

## 2018-09-16 DIAGNOSIS — Z7982 Long term (current) use of aspirin: Secondary | ICD-10-CM | POA: Diagnosis not present

## 2018-09-16 DIAGNOSIS — E1122 Type 2 diabetes mellitus with diabetic chronic kidney disease: Secondary | ICD-10-CM | POA: Diagnosis present

## 2018-09-16 DIAGNOSIS — R0902 Hypoxemia: Secondary | ICD-10-CM

## 2018-09-16 DIAGNOSIS — Z89412 Acquired absence of left great toe: Secondary | ICD-10-CM | POA: Diagnosis not present

## 2018-09-16 DIAGNOSIS — Z79899 Other long term (current) drug therapy: Secondary | ICD-10-CM | POA: Diagnosis not present

## 2018-09-16 DIAGNOSIS — Z8679 Personal history of other diseases of the circulatory system: Secondary | ICD-10-CM | POA: Diagnosis not present

## 2018-09-16 DIAGNOSIS — I130 Hypertensive heart and chronic kidney disease with heart failure and stage 1 through stage 4 chronic kidney disease, or unspecified chronic kidney disease: Secondary | ICD-10-CM | POA: Diagnosis present

## 2018-09-16 LAB — CBC
HCT: 23.6 % — ABNORMAL LOW (ref 36.0–46.0)
HEMOGLOBIN: 7.6 g/dL — AB (ref 12.0–15.0)
MCH: 26 pg (ref 26.0–34.0)
MCHC: 32.2 g/dL (ref 30.0–36.0)
MCV: 80.8 fL (ref 80.0–100.0)
NRBC: 0 % (ref 0.0–0.2)
Platelets: 283 10*3/uL (ref 150–400)
RBC: 2.92 MIL/uL — ABNORMAL LOW (ref 3.87–5.11)
RDW: 17.6 % — AB (ref 11.5–15.5)
WBC: 4.7 10*3/uL (ref 4.0–10.5)

## 2018-09-16 LAB — BASIC METABOLIC PANEL
Anion gap: 12 (ref 5–15)
BUN: 36 mg/dL — AB (ref 8–23)
CO2: 26 mmol/L (ref 22–32)
Calcium: 8.5 mg/dL — ABNORMAL LOW (ref 8.9–10.3)
Chloride: 105 mmol/L (ref 98–111)
Creatinine, Ser: 2.32 mg/dL — ABNORMAL HIGH (ref 0.44–1.00)
GFR, EST AFRICAN AMERICAN: 20 mL/min — AB (ref >60)
GFR, EST NON AFRICAN AMERICAN: 17 mL/min — AB (ref >60)
Glucose, Bld: 111 mg/dL — ABNORMAL HIGH (ref 70–99)
POTASSIUM: 3.5 mmol/L (ref 3.5–5.1)
Sodium: 143 mmol/L (ref 135–145)

## 2018-09-16 LAB — GLUCOSE, CAPILLARY
GLUCOSE-CAPILLARY: 172 mg/dL — AB (ref 70–99)
Glucose-Capillary: 95 mg/dL (ref 70–99)
Glucose-Capillary: 166 mg/dL — ABNORMAL HIGH (ref 70–99)
Glucose-Capillary: 152 mg/dL — ABNORMAL HIGH (ref 70–99)

## 2018-09-16 MED ORDER — ONDANSETRON HCL 4 MG/2ML IJ SOLN
4.0000 mg | Freq: Four times a day (QID) | INTRAMUSCULAR | Status: DC | PRN
  Administered 2018-09-16: 4 mg via INTRAVENOUS
  Filled 2018-09-16: qty 2

## 2018-09-16 NOTE — Progress Notes (Signed)
[place] at [place] NAME: [name]    MR#:  [id number]  DATE OF BIRTH:  03-31-1926  SUBJECTIVE:  CHIEF COMPLAINT:   Chief Complaint  Patient presents with  . Shortness of Breath   Admitted after recent discharge from hospital while he getting treated for CHF Had diuresis with IV Lasix, renal function is stable and patient feels slightly better.  REVIEW OF SYSTEMS:  CONSTITUTIONAL: No fever, have fatigue or weakness.  EYES: No blurred or double vision.  EARS, NOSE, AND THROAT: No tinnitus or ear pain.  RESPIRATORY: No cough, some shortness of breath, wheezing or hemoptysis.  CARDIOVASCULAR: No chest pain, orthopnea, edema.  GASTROINTESTINAL: No nausea, vomiting, diarrhea or abdominal pain.  GENITOURINARY: No dysuria, hematuria.  ENDOCRINE: No polyuria, nocturia,  HEMATOLOGY: No anemia, easy bruising or bleeding SKIN: No rash or lesion. MUSCULOSKELETAL: No joint pain or arthritis.   NEUROLOGIC: No tingling, numbness, weakness.  PSYCHIATRY: No anxiety or depression.   ROS  DRUG ALLERGIES:  No Known Allergies  VITALS:  Blood pressure 107/65, pulse 63, temperature (!) 97.4 F (36.3 C), temperature source Oral, resp. rate 18, height 5\' 6"  (1.676 m), weight 55.5 kg, SpO2 98 %.  PHYSICAL EXAMINATION:   GENERAL:  83 y.o.-year-old patient lying in the bed with no acute distress.  EYES: Pupils equal, round, reactive to light and accommodation. No scleral icterus. Extraocular muscles intact.  HEENT: Head atraumatic, normocephalic. Oropharynx and nasopharynx clear.  NECK:  Supple, no jugular venous distention. No thyroid enlargement, no tenderness.  LUNGS: Normal breath sounds bilaterally, no wheezing, some crepitation. No use of accessory muscles of respiration.  CARDIOVASCULAR: S1, S2 normal. No murmurs, rubs, or gallops.  ABDOMEN: Soft, nontender, nondistended. Bowel sounds present. No organomegaly or mass.  EXTREMITIES: No pedal  edema, cyanosis, or clubbing.  NEUROLOGIC: Cranial nerves II through XII are intact. Muscle strength 4/5 in all extremities. Sensation intact. Gait not checked.  PSYCHIATRIC: The patient is alert and oriented x 3.  SKIN: No obvious rash, lesion, or ulcer.   Physical Exam LABORATORY PANEL:   CBC Recent Labs  Lab 09/16/18 0601  WBC 4.7  HGB 7.6*  HCT 23.6*  PLT 283   ------------------------------------------------------------------------------------------------------------------  Chemistries  Recent Labs  Lab 09/16/18 0601  NA 143  K 3.5  CL 105  CO2 26  GLUCOSE 111*  BUN 36*  CREATININE 2.32*  CALCIUM 8.5*   ------------------------------------------------------------------------------------------------------------------  Cardiac Enzymes Recent Labs  Lab 09/15/18 1509 09/15/18 1838  TROPONINI 0.07* 0.08*   ------------------------------------------------------------------------------------------------------------------  RADIOLOGY:  Dg Chest 2 View  Result Date: 09/16/2018 CLINICAL DATA:  CHF EXAM: CHEST - 2 VIEW COMPARISON:  09/15/2018 FINDINGS: Mild cardiomegaly. Interstitial prominence throughout the lungs, slightly improved. Persistent bibasilar opacities and small effusions. IMPRESSION: Slight interval improvement in the diffuse interstitial prominence/edema. Continued small bilateral effusions with bibasilar atelectasis or infiltrates. Electronically Signed   By: Rolm Baptise M.D.   On: 09/16/2018 08:22   Dg Chest 2 View  Result Date: 09/15/2018 CLINICAL DATA:  Shortness of breath began 6 days ago. Recently diagnosed with CHF. Persistent symptoms. EXAM: CHEST - 2 VIEW COMPARISON:  PA and lateral chest x-ray of September 11, 2018 FINDINGS: The lungs are well-expanded. There small bilateral pleural effusions. The interstitial markings are increased. Patchy airspace opacity previously demonstrated at the lung bases has improved. The cardiac silhouette remains  enlarged. The pulmonary vascularity is engorged. There is calcification in the wall of the thoracic aorta. IMPRESSION: CHF with  interstitial edema. Patchy airspace opacity in the right infrahilar region is present. There are small bilateral pleural effusions which are slightly less conspicuous today. Thoracic aortic atherosclerosis. Electronically Signed   By: David  Martinique M.D.   On: 09/15/2018 08:50    ASSESSMENT AND PLAN:   Principal Problem:   Acute congestive heart failure (HCC) Active Problems:   CKD (chronic kidney disease) stage 4, GFR 15-29 ml/min (HCC)   Acute on chronic systolic heart failure (HCC)   CHF (congestive heart failure) (HCC)   Advanced care planning/counseling discussion   Goals of care, counseling/discussion  *Acute on chronic systolic congestive heart failure Recent admission to [facility]. IV Lasix, may need low-dose oral Lasix on discharge. Monitor intake and output and renal function closely. appreciated cardiology consult.  After discussing with family, decision was made about no invasive work-up like cath for low EF.  *Chronic kidney disease stage IV Monitor renal function closely while giving IV Lasix.  *Anemia due to chronic kidney disease  Monitor while in hospital.  This may be contributing to SOB.  *Hypertension Continue amlodipine and carvedilol. Also on Lasix.  BP was soft- hold meds, stopped amlodipine.  *Diabetes Continue glipizide Keep on sliding scale coverage.  *Hyperlipidemia Continue atorvastatin.     All the records are reviewed and case discussed with Care Management/Social Workerr. Management plans discussed with the patient, family and they are in agreement.  CODE STATUS: DNR  TOTAL TIME TAKING CARE OF THIS PATIENT: 35 minutes.     POSSIBLE D/C IN 1-2 DAYS, DEPENDING ON CLINICAL CONDITION.   Vaughan Basta M.D on 09/16/2018   Between 7am to 6pm - Pager - [phone]  After 6pm go to  [email or web address] - password [email or web address] [place] [facility]  Office  [phone]  CC: Primary care physician; Lucianne Lei, MD  Note: This dictation was prepared with Dragon dictation along with smaller phrase technology. Any transcriptional errors that result from this process are unintentional.

## 2018-09-16 NOTE — Consult Note (Signed)
Consultation Note Date: 09/16/2018   Patient Name: [name]  DOB: Jun 18, 1926  MRN: [id number]  Age / Sex: 84 y.o., female  PCP: Lucianne Lei, MD Referring Physician: Vaughan Basta, *  Reason for Consultation: Establishing goals of care  HPI/Patient Profile: 84 y.o. female  with past medical history of CHF (EF 20-25%), CKD, HLD, DM2, PVD admitted on 09/15/2018 with SOB felt to be related to CHF exacerbation. Palliative medicine consulted for [place].   Clinical Assessment and Goals of Care: Patient was seen by my partner Wadie Lessen, NP on 09/11/18 during patient's last admission also for CHF exacerbation.  During that admission GOC were defined and patient was determined to be made DNR code status. Patient was hopeful and able to return home.  Patient's granddaughter who is primary caretaker and who patient lives with felt that patient returned to hospital so quickly because she was released before she was adequately managed.  Other physician notes indicate patient was not instructed to take lasix on discharge- possible due to kidney issues.  Patient lying in bed, eyes closed, in no distress. Patient and granddaughter- Peggye Form was at bedside. She stated she remembered meeting with previous Palliative provider on Sunday and didn't feel a need for further discussion. She said that [place] remains for her Grandmother to improve and return home. Lishel does agree to further discussion with Palliative providers during this admission if her Grandmother fails to improve or shows signs of further decline. She also agrees to outpatient Palliative follow-up for continued GOC.   Primary Decision Maker NEXT OF KIN- granddaughter- Lishel Blackmon    SUMMARY OF RECOMMENDATIONS -Continue current care -Outpatient followup with Palliative at discharge -PMT will shadow and intervene if patient declines- please call  if further assistance is needed    Code Status/Advance Care Planning:  DNR  Additional Recommendations (Limitations, Scope, Preferences):  Full Scope Treatment  Prognosis:    Unable to determine  Discharge Planning: Home with Palliative Services  Primary Diagnoses: Present on Admission: **None**   I have reviewed the medical record, interviewed the patient and family, and examined the patient. The following aspects are pertinent.  Past Medical History:  Diagnosis Date  . ACS (acute coronary syndrome) ([place]) 08/2018  . Anemia    family unsure of this history  . Arthritis   . CHF (congestive heart failure) ([place])   . Chronic ulcer of calf (HCC)    Left  . Constipation   . Diabetes mellitus without complication ([place])   . Dyslipidemia   . Dyspnea   . GERD (gastroesophageal reflux disease)   . Hypertension   . Peripheral vascular disease ([place])    Social History   Socioeconomic History  . Marital status: Legally Separated    Spouse name: Not on file  . Number of children: Not on file  . Years of education: Not on file  . Highest education level: Not on file  Occupational History  . Not on file  Social Needs  . Financial resource strain: Not on file  .  Food insecurity:    Worry: Not on file    Inability: Not on file  . Transportation needs:    Medical: Not on file    Non-medical: Not on file  Tobacco Use  . Smoking status: Never Smoker  . Smokeless tobacco: Never Used  Substance and Sexual Activity  . Alcohol use: No  . Drug use: No  . Sexual activity: Not Currently  Lifestyle  . Physical activity:    Days per week: Not on file    Minutes per session: Not on file  . Stress: Not on file  Relationships  . Social connections:    Talks on phone: Not on file    Gets together: Not on file    Attends religious service: Not on file    Active member of club or organization: Not on file    Attends meetings of clubs or organizations: Not on file     Relationship status: Not on file  Other Topics Concern  . Not on file  Social History Narrative  . Not on file   Family History  Problem Relation Age of Onset  . Hypertension Father    Scheduled Meds: . amLODipine  10 mg Oral Daily  . aspirin EC  81 mg Oral Daily  . atorvastatin  10 mg Oral Daily  . benzonatate  100 mg Oral TID  . calcium-vitamin D  1 tablet Oral Daily  . carvedilol  12.5 mg Oral BID WC  . citalopram  10 mg Oral Daily  . clopidogrel  75 mg Oral Q breakfast  . furosemide  20 mg Intravenous Q12H  . glipiZIDE  2.5 mg Oral BID AC  . heparin  5,000 Units Subcutaneous Q8H  . insulin aspart  0-9 Units Subcutaneous TID WC  . pantoprazole  20 mg Oral Daily   Continuous Infusions: PRN Meds:.acetaminophen, docusate sodium, ondansetron (ZOFRAN) IV, polyethylene glycol Medications Prior to Admission:  Prior to Admission medications   Medication Sig Start Date End Date Taking? Authorizing Provider  ACCU-CHEK FASTCLIX LANCETS MISC TEST TID 12/31/17  Yes [provider]  ACCU-CHEK GUIDE test strip  12/31/17  Yes [provider]  acetaminophen (TYLENOL) 325 MG tablet Take 2 tablets (650 mg total) by mouth every 6 (six) hours as needed for mild pain or headache (or Fever >/= 101). 09/13/18  Yes Emokpae, Courage, MD  amLODipine (NORVASC) 10 MG tablet Take 1 tablet (10 mg total) by mouth daily. 09/13/18  Yes Roxan Hockey, MD  aspirin EC 81 MG tablet Take 1 tablet (81 mg total) by mouth daily. With food 09/13/18  Yes Emokpae, Courage, MD  atorvastatin (LIPITOR) 10 MG tablet Take 1 tablet (10 mg total) by mouth daily. 09/13/18  Yes Emokpae, Courage, MD  Calcium Carbonate-Vitamin D (CALCIUM 500 + D) 500-125 MG-UNIT TABS Take 1 tablet by mouth daily.    Yes [provider]  carvedilol (COREG) 12.5 MG tablet Take 1 tablet (12.5 mg total) by mouth 2 (two) times daily with a meal. 09/13/18  Yes Emokpae, Courage, MD  Cholecalciferol (VITAMIN D3 GUMMIES ADULT PO)  Take 1 tablet by mouth daily.   Yes [provider]  citalopram (CELEXA) 10 MG tablet Take 10 mg by mouth daily.  05/12/18  Yes [provider]  clopidogrel (PLAVIX) 75 MG tablet Take 1 tablet (75 mg total) by mouth daily with breakfast. 09/13/18  Yes Emokpae, Courage, MD  glipiZIDE (GLUCOTROL) 5 MG tablet Take 0.5 tablets (2.5 mg total) by mouth 2 (two) times  daily before a meal. Always take with food 09/13/18  Yes Emokpae, Courage, MD  pantoprazole (PROTONIX) 20 MG tablet Take 1 tablet (20 mg total) by mouth daily. 07/13/14  Yes Kindl, Nelda Severe, MD  polyethylene glycol (MIRALAX / GLYCOLAX) packet Take 17 g by mouth daily as needed (for constipation.).    Yes [provider]  benzonatate (TESSALON) 100 MG capsule Take 100 mg by mouth 3 (three) times daily. 09/06/18   [provider]  Blood Glucose Monitoring Suppl (ACCU-CHEK GUIDE) w/Device KIT See admin instructions. 12/28/17   [provider]  Calcium-Magnesium-Zinc (CAL-MAG-ZINC PO) Take 1 tablet by mouth daily.    [provider]   No Known Allergies Review of Systems  Physical Exam  Constitutional: She appears well-developed and well-nourished.  Cardiovascular: Normal rate.  Pulmonary/Chest: Effort normal.  Neurological: She is alert.  Nursing note and vitals reviewed.   Vital Signs: BP (!) 108/57 (BP Location: Right Arm)   Pulse 63   Temp (!) 97.4 F (36.3 C) (Oral)   Resp 18   Ht 5' 6"  (1.676 m)   Wt 55.5 kg   SpO2 95%   BMI 19.74 kg/m  Pain Scale: 0-10   Pain Score: 0-No pain   SpO2: SpO2: 95 % O2 Device:SpO2: 95 % O2 Flow Rate: .O2 Flow Rate (L/min): 2 L/min  IO: Intake/output summary:   Intake/Output Summary (Last 24 hours) at 09/16/2018 1340 Last data filed at 09/16/2018 1100 Gross per 24 hour  Intake 360 ml  Output 1050 ml  Net -690 ml    LBM: Last BM Date: 09/14/18 Baseline Weight: Weight: 55.8 kg Most recent weight: Weight: 55.5 kg     Palliative  Assessment/Data: PPS: 40%     Thank you for this consult. Palliative medicine will continue to follow and assist as needed.   Time In: 1000 Time Out: 1100 Time Total:60 minutes Greater than 50%  of this time was spent counseling and coordinating care related to the above assessment and plan.  Signed by: Mariana Kaufman, AGNP-C Palliative Medicine    Please contact Palliative Medicine Team phone at [phone] for questions and concerns.  For individual provider: See Shea Evans

## 2018-09-16 NOTE — Plan of Care (Signed)

## 2018-09-16 NOTE — Progress Notes (Signed)
Pts BP is soft. BP was soft last shift and IV lasix was held. MD notified. Norvasc was held. I will continue to assess.

## 2018-09-16 NOTE — Progress Notes (Addendum)
Progress Note  Patient Name: [name] Date of Encounter: 09/16/2018  Primary Cardiologist: Pixie Casino, MD   Subjective   Patient asleep during most of exam, as she is now in a bed with head raised and able to sleep better than when laying flat.  Started on diuresis yesterday -  IV lasix 20mg  q12 Net out -990 ml Wt 55.8kg  55.5kg  HR 60-80s, BP 94/54 Cr 2.32; K 3.5  Daughter in room at time of exam and noted that they do not wish to pursue further ischemic / invasive cath evaluation. She stated that the family would like to obtain home oxygen for the patient.  Long discussion with daughter regarding etiology of patient's SOB with consideration of anemia vs. reduced EF.  Inpatient Medications    Scheduled Meds: . amLODipine  10 mg Oral Daily  . aspirin EC  81 mg Oral Daily  . atorvastatin  10 mg Oral Daily  . benzonatate  100 mg Oral TID  . calcium-vitamin D  1 tablet Oral Daily  . carvedilol  12.5 mg Oral BID WC  . citalopram  10 mg Oral Daily  . clopidogrel  75 mg Oral Q breakfast  . furosemide  20 mg Intravenous Q12H  . glipiZIDE  2.5 mg Oral BID AC  . heparin  5,000 Units Subcutaneous Q8H  . insulin aspart  0-9 Units Subcutaneous TID WC  . pantoprazole  20 mg Oral Daily   Continuous Infusions:  PRN Meds: acetaminophen, docusate sodium, ondansetron (ZOFRAN) IV, polyethylene glycol   Vital Signs    Vitals:   09/15/18 2000 09/16/18 0347 09/16/18 0746 09/16/18 0954  BP: (!) 94/54 117/61 126/80 (!) 108/57  Pulse:  80 74 63  Resp:  18 18   Temp:  98.8 F (37.1 C) (!) 97.4 F (36.3 C)   TempSrc:   Oral   SpO2:  96% 95%   Weight:  55.5 kg    Height:        Intake/Output Summary (Last 24 hours) at 09/16/2018 1104 Last data filed at 09/16/2018 1023 Gross per 24 hour  Intake 360 ml  Output 1350 ml  Net -990 ml   Filed Weights   09/15/18 0703 09/15/18 1703 09/16/18 0347  Weight: 55.8 kg 55.1 kg 55.5 kg    Telemetry    NSR, HR 60-80s -  Personally Reviewed  Physical Exam   GEN: No acute distress.  Elderly, frail AA female. Asleep on exam. Joined by daughter Neck: Distended neck veins with JVP mildly elevated at +10 Cardiac: RRR, no rubs, or gallops. + 1/6 systolic murmur Respiratory: Clear to auscultation bilaterally - sleeping with coarse breath sounds at bases  GI: Soft, nontender, non-distended  MS: 1+ ankle edema (in setting of anemia); No deformity. Neuro:  Nonfocal  Psych: Normal affect   Labs    Chemistry Recent Labs  Lab 09/13/18 1130 09/15/18 0738 09/15/18 1509 09/16/18 0601  NA 139 138  --  143  K 3.9 3.7  --  3.5  CL 105 101  --  105  CO2 24 24  --  26  GLUCOSE 190* 138*  --  111*  BUN 33* 40*  --  36*  CREATININE 2.73* 2.54* 2.33* 2.32*  CALCIUM 8.5* 8.5*  --  8.5*  GFRNONAA 14* 16* 17* 17*  GFRAA 17* 18* 20* 20*  ANIONGAP 10 13  --  12     Hematology Recent Labs  Lab 09/15/18 7169 09/15/18 1509 09/16/18 0601  WBC 5.7 5.0 4.7  RBC 3.16* 3.20* 2.92*  HGB 8.2* 8.4* 7.6*  HCT 25.8* 26.6* 23.6*  MCV 81.6 83.1 80.8  MCH 25.9* 26.3 26.0  MCHC 31.8 31.6 32.2  RDW 17.6* 18.6* 17.6*  PLT 324 308 283    Cardiac Enzymes Recent Labs  Lab 09/15/18 0738 09/15/18 1205 09/15/18 1509 09/15/18 1838  TROPONINI 0.04* 0.06* 0.07* 0.08*    Recent Labs  Lab 09/09/18 1206  TROPIPOC 0.08     BNP Recent Labs  Lab 09/09/18 1419 09/15/18 0738  BNP 676.4* 398.0*     DDimer No results for input(s): DDIMER in the last 168 hours.   Radiology    Dg Chest 2 View  Result Date: 09/16/2018 CLINICAL DATA:  CHF EXAM: CHEST - 2 VIEW COMPARISON:  09/15/2018 FINDINGS: Mild cardiomegaly. Interstitial prominence throughout the lungs, slightly improved. Persistent bibasilar opacities and small effusions. IMPRESSION: Slight interval improvement in the diffuse interstitial prominence/edema. Continued small bilateral effusions with bibasilar atelectasis or infiltrates. Electronically Signed   By: Rolm Baptise M.D.   On: 09/16/2018 08:22   Dg Chest 2 View  Result Date: 09/15/2018 CLINICAL DATA:  Shortness of breath began 6 days ago. Recently diagnosed with CHF. Persistent symptoms. EXAM: CHEST - 2 VIEW COMPARISON:  PA and lateral chest x-ray of September 11, 2018 FINDINGS: The lungs are well-expanded. There small bilateral pleural effusions. The interstitial markings are increased. Patchy airspace opacity previously demonstrated at the lung bases has improved. The cardiac silhouette remains enlarged. The pulmonary vascularity is engorged. There is calcification in the wall of the thoracic aorta. IMPRESSION: CHF with interstitial edema. Patchy airspace opacity in the right infrahilar region is present. There are small bilateral pleural effusions which are slightly less conspicuous today. Thoracic aortic atherosclerosis. Electronically Signed   By: David  Martinique M.D.   On: 09/15/2018 08:50    Cardiac Studies   Relevant CV Studies: Echo 09/09/18 Study Conclusions - Procedure narrative: Transthoracic echocardiography. Image quality was adequate. The study was technically difficult, as a result of poor sound wave transmission. Intravenous contrast (Definity) was administered. - Left ventricle: The cavity size was normal. Wall thickness was increased in a pattern of mild LVH. Systolic function was severely reduced. The estimated ejection fraction was in the range of 20% to 25%. No apical thrombus with Definity contrast. Severe apical hypokinesis with preserved basal function, suggestive of possible Takatsubo cardiomyopathy or multivessel coronary artery disease. Doppler parameters are consistent with abnormal left ventricular relaxation (grade 1 diastolic dysfunction). LV filling pressures are markedly elevated. - Aortic valve: Sclerosis without stenosis. There was mild regurgitation. Valve area (Vmax): 1.28 cm^2. - Mitral valve: Mildly thickened leaflets . Sclerotic  anterior leaflet. There was mild to moderate regurgitation. - Left atrium: The atrium was normal in size. - Tricuspid valve: There was moderate regurgitation. - Pulmonary arteries: PA peak pressure: 56 mm Hg (S). - Inferior vena cava: The vessel was dilated. The respirophasic diameter changes were blunted (<50%), consistent with elevated central venous pressure. - Pericardium, extracardiac: There was no pericardial effusion. There was a left pleural effusion. Impressions: - Technically difficult study. Definity contrast given. Compared to a prior study in 2015, the LVEF is markedly lower at 20-25% with predominantly mid to distal anterior, apical, inferior and lateral wall hypokinesis with a preserved basal function, suggestive of possible Takatsubo cardimyopathy or multivessel coronary artery disease. LV filling pressure is very high. RVSP is 56 mmHg with a dilated IVC and left pleural effusion.   Patient Profile  84 y.o. 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 Anselm Jungling.  Assessment & Plan    Acute HFrEF (EF 20-25%, 08/2018) - 10/11-10/15: Admission at [facility]. Newly depressed LV function. See original consult HPI for additional details. Advanced ischemic workup / therapy not recommended with cath given patient age, renal insufficiency, and comorbidities. No ACE/ARB/spiro given d/t CKD.  - 10/17: Troponin 0.04  0.06  0.07. No CP - elevated Tn likely d/t demand ischemia in setting of acute HFrEF rather than ACS. Reasonable to continue heparin subcutaneous injection at this time. BNP 398, down from previous BNP of 676.4 at [facility] as above. Elevated  JVP but otherwise not significantly volume overloaded  - Continue IV lasix 20mg  q12, started in ED. Recommend cautious diuresis, given renal function. Daily BMET;  I/O, daily weights. Fluid restriction. - Continue medical management started at [facility] discharge with Coreg 12.5 mg BID and titrate as needed for HR and BP control. Continue home Plavix 75mg  with first dose tomorrow AM 10/18. Recommend continue ASA 81mg  daily. Continue Lipitor 10mg  daily. Consider holding amlodipine 10mg  if HR and BP allow.  - Will start process to obtain home O2.  - SOB and LEE likely d/t combination of anemia of chronic disease and recently reduced EF. PRBC transfusion might be helpful in the future as below if continues to have SOB. - Recommend cardiology f/u as outpatient  SOB with Hypoxia (SpO2 86%), improved with nasal cannula - 10/13 NM Pulmonary Perf and Vent study with no focal ventilation defect and not consistent with pulmonary embolism. CXR as above. - Working on obtaining home O2 - Continue to monitor O2 saturation, vitals  CKD III/IV with associated anemia of chronic disease - Followed by hematology and oncology.  - Receiving epo injections - Labile creatinine - Cr (10/13) 2.50  (10/14) 2.39  (10/15) 2.73  (10/17) 2.54  2.33  2.32 - Daily BMET. Caution with diuresis. Close monitoring of renal function and electrolytes. Avoid nephrotoxic medications / procedures as above. No ACE/ARB/ARNI - Recommend transfusion if continues to have SOB - Per IM  HTN - Hypotensive this AM. HR 60-80s. Monitor  - Continue medical therapy with antihypertensives as above and titrate as needed for HR/BP. Monitor vitals  HLD - Continue statin as above - Last LDL 12/2013 45 and at goal. Consider updating LDL now or with outpatient labs.  - LDL goal <70  DM2 - SSI - Per IM  PVD - Per IM  For questions or updates, please contact [employer] Please consult [email or web address] for contact info under          Signed, Arvil Chaco, PA-C  09/16/2018, 11:04 AM     Attending Note Patient seen and examined, agree with detailed note above,  Patient presentation and plan discussed on rounds.   EKG lab work, chest x-ray, echocardiogram reviewed independently by myself  Patient resting comfortably, sleepy, feels cold Daughter at the bedside,  Long discussion concerning underlying medical issues including: Chronic kidney disease, cardiomyopathy systolic CHF, anemia Reports patient brought in for shortness of breath when supine in bed and with exertion  Lab work reviewed with daughter, hemoglobin in the 7 range, Creatinine 2.32 BUN 36 potassium 3.5 troponin 0 0.08  Telemetry reviewed  normal sinus rhythm  On exam she is laying on her side trying to sleep, minimally conversant but alert, unable to estimate JVD, lungs grossly clear to auscultation with no wheezes or rales heart sounds regular normal S1-S2 no murmurs appreciated abdomen soft nontender no significant lower extremity edema  BNP on arrival in the 300s  A/P: 1) chronic systolic CHF BNP in the 638G, lower than last week when it was in the 600s Suspect readmission for symptoms of shortness of breath which is multifactorial, less for acute exacerbation of her CHF -Given stable renal function would continue Lasix 20 IV twice daily Close monitoring of renal function Symptoms exacerbated by profound anemia  2) ischemic cardiomyopathy Cardiomyopathy presumed to be ischemic based on severe PAD Likely multivessel disease Discussed with family in detail, they do not want cardiac catheterization at this time They feel that she would not want it Continue aspirin, Plavix, carvedilol, Lipitor No recent lipid panel available  3) chronic kidney disease, stage IV Stable creatinine 2.3 Gentle diuresis with close monitoring  4) anemia Managed by hematology oncology, given Aranesp recently No significant change in blood count Likely  contributing to shortness of breath symptoms and hypoxia  5) dyspnea Multifactorial including anemia, cardiomyopathy Nurses did report some hypoxia and will record ambulatory saturations Suspect she may qualify for home oxygen Family reports unable to lay flat in her current situation,   Dispo: We did discuss palliative and hospice Daughter is aware, would like to try oxygen if she qualifies  Greater than 50% was spent in counseling and coordination of care with patient Total encounter time 35 minutes or more   Signed: Esmond Plants  M.D., Ph.D. [facility]

## 2018-09-16 NOTE — Care Management Note (Signed)
Case Management Note  Patient Details  Name: GENIE WENKE MRN: [id number] Date of Birth: 1926-08-26  Subjective/Objective:      Patient from home; son lives with her.  Recently diagnosed with CHF.  Has a functioning scale and educated on importance of weighing everyday.  Denies difficulty obtaining medications or with transportation.  She is currently on room air and no oxygen at room.  No current services in the home.  Has used [facility] in the past.  Will follow up with out patient palliative after discharge.  Uses a walker at home.  No PT ordered as of now.  Spoke with nurse and she is ambulating without difficulty and up to chair.    Will continue to follow and progress patient.         Action/Plan:   Expected Discharge Date:                  Expected Discharge Plan:     In-House Referral:  Hospice / Palliative Care  Discharge planning Services  CM Consult  Post Acute Care Choice:    Choice offered to:     DME Arranged:    DME Agency:     HH Arranged:    [place] Agency:     Status of Service:  In process, will continue to follow  If discussed at Long Length of Stay Meetings, dates discussed:    Additional Comments:  Elza Rafter, RN 09/16/2018, 3:33 PM

## 2018-09-16 NOTE — Progress Notes (Signed)
New referral for outpatient Palliative to follow at home received from [facility]. Patient lives in [place], patient information faxed to referral. Flo Shanks RN, BSN, [place] [facility] [place], hospital Liaison [phone]

## 2018-09-17 DIAGNOSIS — Z23 Encounter for immunization: Secondary | ICD-10-CM | POA: Diagnosis not present

## 2018-09-17 LAB — URINALYSIS, ROUTINE W REFLEX MICROSCOPIC
Bilirubin Urine: NEGATIVE
Glucose, UA: NEGATIVE mg/dL
Hgb urine dipstick: NEGATIVE
Ketones, ur: NEGATIVE mg/dL
Nitrite: NEGATIVE
Protein, ur: NEGATIVE mg/dL
Specific Gravity, Urine: 1.016 (ref 1.005–1.030)
pH: 5 (ref 5.0–8.0)

## 2018-09-17 LAB — BASIC METABOLIC PANEL
Anion gap: 10 (ref 5–15)
BUN: 40 mg/dL — AB (ref 8–23)
CHLORIDE: 106 mmol/L (ref 98–111)
CO2: 27 mmol/L (ref 22–32)
CREATININE: 2.61 mg/dL — AB (ref 0.44–1.00)
Calcium: 8.3 mg/dL — ABNORMAL LOW (ref 8.9–10.3)
GFR calc Af Amer: 17 mL/min — ABNORMAL LOW (ref >60)
GFR calc non Af Amer: 15 mL/min — ABNORMAL LOW (ref >60)
GLUCOSE: 71 mg/dL (ref 70–99)
Potassium: 3.7 mmol/L (ref 3.5–5.1)
Sodium: 143 mmol/L (ref 135–145)

## 2018-09-17 LAB — CBC
HEMATOCRIT: 23 % — AB (ref 36.0–46.0)
HEMOGLOBIN: 7.4 g/dL — AB (ref 12.0–15.0)
MCH: 25.9 pg — AB (ref 26.0–34.0)
MCHC: 32.2 g/dL (ref 30.0–36.0)
MCV: 80.4 fL (ref 80.0–100.0)
Platelets: 279 10*3/uL (ref 150–400)
RBC: 2.86 MIL/uL — AB (ref 3.87–5.11)
RDW: 17.5 % — ABNORMAL HIGH (ref 11.5–15.5)
WBC: 4.7 10*3/uL (ref 4.0–10.5)
nRBC: 0 % (ref 0.0–0.2)

## 2018-09-17 LAB — GLUCOSE, CAPILLARY
GLUCOSE-CAPILLARY: 80 mg/dL (ref 70–99)
GLUCOSE-CAPILLARY: 73 mg/dL (ref 70–99)
GLUCOSE-CAPILLARY: 103 mg/dL — AB (ref 70–99)

## 2018-09-17 MED ORDER — FUROSEMIDE 40 MG PO TABS: 40.0000 mg | ORAL_TABLET | Freq: Every day | ORAL | 1 refills | Status: DC

## 2018-09-17 MED ORDER — INFLUENZA VAC SPLIT HIGH-DOSE 0.5 ML IM SUSY
0.5000 mL | PREFILLED_SYRINGE | INTRAMUSCULAR | Status: AC
  Administered 2018-09-17: 0.5 mL via INTRAMUSCULAR
  Filled 2018-09-17: qty 0.5

## 2018-09-17 NOTE — Care Management Note (Addendum)
Case Management Note  Patient Details  Name: [name] MRN: [id number] Date of Birth: 1926-01-25  Subjective/Objective: Patient to be discharged per MD order. Orders in place for home health services. Patient agreeable to home health and prefers to use [facility]. Referral placed to [facility] with [facility] for PT/RN. Also, patient qualifies for home O2. Will obtain from [employer]. Family to provide transport. Patient also worked up for palliative referral. Ines Bloomer RN BSN RNCM [phone]                      Action/Plan:   Expected Discharge Date:  09/17/18               Expected Discharge Plan:     In-House Referral:  Hospice / Palliative Care  Discharge planning Services  CM Consult  Post Acute Care Choice:  Durable Medical Equipment, Home Health Choice offered to:  Patient  DME Arranged:  Oxygen DME Agency:  [place] Arranged:  RN, PT [facility] Agency:  [place]  Status of Service:  Completed, signed off  If discussed at [place] of Stay Meetings, dates discussed:    Additional Comments:  Latanya Maudlin, RN 09/17/2018, 2:16 PM

## 2018-09-17 NOTE — Progress Notes (Signed)
SATURATION QUALIFICATIONS: (This note is used to comply with regulatory documentation for home oxygen)  Patient Saturations on Room Air at Rest = 93%  Patient Saturations on Room Air while Ambulating = 87%  Patient Saturations on 2 Liters of oxygen while Ambulating = 95%  Please briefly explain why patient needs home oxygen: Pt is DOE and per MD has a low EF. Oxygen at home would benefit patient.

## 2018-09-17 NOTE — Discharge Summary (Signed)
[name] NAME: [name]    MR#:  [id number]  DATE OF BIRTH:  Nov 16, 1926  DATE OF ADMISSION:  09/15/2018 ADMITTING PHYSICIAN: Vaughan Basta, MD  DATE OF DISCHARGE: 09/17/2018  PRIMARY CARE PHYSICIAN: Lucianne Lei, MD    ADMISSION DIAGNOSIS:  CHF (congestive heart failure) (HCC) [I50.9] Hypoxia [R09.02] Dyspnea, unspecified type [R06.00] Acute congestive heart failure, unspecified heart failure type ([name]) [I50.9]  DISCHARGE DIAGNOSIS:  Principal Problem:   Acute congestive heart failure (HCC) Active Problems:   CKD (chronic kidney disease) stage 4, GFR 15-29 ml/min (HCC)   Acute on chronic systolic heart failure (HCC)   CHF (congestive heart failure) ([name])   Advanced care planning/counseling discussion   Goals of care, counseling/discussion   SECONDARY DIAGNOSIS:   Past Medical History:  Diagnosis Date  . ACS (acute coronary syndrome) ([name]) 08/2018  . Anemia    family unsure of this history  . Arthritis   . CHF (congestive heart failure) ([place])   . Chronic ulcer of calf (HCC)    Left  . Constipation   . Diabetes mellitus without complication ([name])   . Dyslipidemia   . Dyspnea   . GERD (gastroesophageal reflux disease)   . Hypertension   . Peripheral vascular disease [name])     HOSPITAL COURSE:   84 year old female with past medical history of hypertension, diabetes, chronic systolic CHF, CKD stage III, peripheral vascular disease, GERD, who presented to the hospital due to shortness of breath.  1.  Acute on chronic systolic CHF-this was the cause of patient's worsening shortness of breath. - Patient was recently discharged from [facility] and taken off the Lasix due to her CKD.  Patient was diuresed with IV Lasix here in the hospital and has responded well.  Her shortness of breath has improved.   -She qualified for home oxygen which is being arranged for her. - Presently patient is  being discharged on oral Lasix, carvedilol and outpatient follow-up with cardiology.  2.  CKD stage III-IV-patient's creatinine is close to baseline can be further followed as an outpatient.  3.  Essential hypertension-patient will continue her carvedilol, she was taken off her Norvasc due to some low blood pressure and also due to worsening peripheral edema.  4.  Anemia of chronic disease-secondary to chronic kidney disease.  Patient's hemoglobin remained stable.  There was no acute need for transfusion while in the hospital.  5.  Diabetes type 2 CKD stage III-IV- blood sugars remained stable, patient was maintained on sliding scale insulin but will zoom her glipizide upon discharge.  6.  Hyperlipidemia-patient will resume her atorvastatin.  7.  GERD-patient will resume her Protonix.  She is being discharged home with home health physical therapy, nursing services with palliative to follow at home.  DISCHARGE CONDITIONS:   Stable  CONSULTS OBTAINED:  Treatment Team:  Nelva Bush, MD Minna Merritts, MD  DRUG ALLERGIES:  No Known Allergies  DISCHARGE MEDICATIONS:   Allergies as of 09/17/2018   No Known Allergies     Medication List    STOP taking these medications   amLODipine 10 MG tablet Commonly known as:  NORVASC     TAKE these medications   ACCU-CHEK FASTCLIX LANCETS Misc TEST TID   ACCU-CHEK GUIDE test strip Generic drug:  glucose blood   ACCU-CHEK GUIDE w/Device Kit See admin instructions.   acetaminophen 325 MG tablet Commonly known as:  TYLENOL Take 2 tablets (650 mg total) by  mouth every 6 (six) hours as needed for mild pain or headache (or Fever >/= 101).   aspirin EC 81 MG tablet Take 1 tablet (81 mg total) by mouth daily. With food   atorvastatin 10 MG tablet Commonly known as:  LIPITOR Take 1 tablet (10 mg total) by mouth daily.   benzonatate 100 MG capsule Commonly known as:  TESSALON Take 100 mg by mouth 3 (three) times daily.    CAL-MAG-ZINC PO Take 1 tablet by mouth daily.   CALCIUM 500 + D 500-125 MG-UNIT Tabs Generic drug:  Calcium Carbonate-Vitamin D Take 1 tablet by mouth daily.   carvedilol 12.5 MG tablet Commonly known as:  COREG Take 1 tablet (12.5 mg total) by mouth 2 (two) times daily with a meal.   citalopram 10 MG tablet Commonly known as:  CELEXA Take 10 mg by mouth daily.   clopidogrel 75 MG tablet Commonly known as:  PLAVIX Take 1 tablet (75 mg total) by mouth daily with breakfast.   furosemide 40 MG tablet Commonly known as:  LASIX Take 1 tablet (40 mg total) by mouth daily.   glipiZIDE 5 MG tablet Commonly known as:  GLUCOTROL Take 0.5 tablets (2.5 mg total) by mouth 2 (two) times daily before a meal. Always take with food   pantoprazole 20 MG tablet Commonly known as:  PROTONIX Take 1 tablet (20 mg total) by mouth daily.   polyethylene glycol packet Commonly known as:  MIRALAX / GLYCOLAX Take 17 g by mouth daily as needed (for constipation.).   VITAMIN D3 GUMMIES ADULT PO Take 1 tablet by mouth daily.            Durable Medical Equipment  (From admission, onward)         Start     Ordered   09/17/18 1345  For home use only DME oxygen  Once    Question Answer Comment  Mode or (Route) Nasal cannula   Liters per Minute 2   Frequency Continuous (stationary and portable oxygen unit needed)   Oxygen conserving device Yes   Oxygen delivery system Gas      09/17/18 1345            DISCHARGE INSTRUCTIONS:   DIET:  Cardiac diet and Diabetic diet  DISCHARGE CONDITION:  Stable  ACTIVITY:  Activity as tolerated  OXYGEN:  Home Oxygen: Yes.     Oxygen Delivery: 2 liters/min via Patient connected to nasal cannula oxygen  DISCHARGE LOCATION:  Home with home health physical therapy, nursing with palliative to follow  If you experience worsening of your admission symptoms, develop shortness of breath, life threatening emergency, suicidal or homicidal  thoughts you must seek medical attention immediately by calling 911 or calling your MD immediately  if symptoms less severe.  You Must read complete instructions/literature along with all the possible adverse reactions/side effects for all the Medicines you take and that have been prescribed to you. Take any new Medicines after you have completely understood and accpet all the possible adverse reactions/side effects.   Please note  You were cared for by a hospitalist during your hospital stay. If you have any questions about your discharge medications or the care you received while you were in the hospital after you are discharged, you can call the unit and asked to speak with the hospitalist on call if the hospitalist that took care of you is not available. Once you are discharged, your primary care physician will handle any further medical issues.  Please note that NO REFILLS for any discharge medications will be authorized once you are discharged, as it is imperative that you return to your primary care physician (or establish a relationship with a primary care physician if you do not have one) for your aftercare needs so that they can reassess your need for medications and monitor your lab values.     Today   Patient sitting up in chair eating lunch in no apparent distress.  Shortness of breath improved.  Patient did qualify for home oxygen which is being arranged for the patient.  Patient to be discharged with home health nursing and physical therapy services.  Family in agreement.  VITAL SIGNS:  Blood pressure 130/79, pulse 81, temperature (!) 97.4 F (36.3 C), temperature source Oral, resp. rate 18, height _0  (1.676 m), weight 55.4 kg, SpO2 90 %.  I/O:    Intake/Output Summary (Last 24 hours) at 09/17/2018 1615 Last data filed at 09/17/2018 1200 Gross per 24 hour  Intake 120 ml  Output 425 ml  Net -305 ml    PHYSICAL EXAMINATION:  GENERAL:  83 y.o.-year-old patient sitting up  in chair in no acute distress.  EYES: Pupils equal, round, reactive to light and accommodation. No scleral icterus. Extraocular muscles intact.  HEENT: Head atraumatic, normocephalic. Oropharynx and nasopharynx clear.  NECK:  Supple, no jugular venous distention. No thyroid enlargement, no tenderness.  LUNGS: Normal breath sounds bilaterally, no wheezing, rales,rhonchi. No use of accessory muscles of respiration.  CARDIOVASCULAR: S1, S2 normal. No murmurs, rubs, or gallops.  ABDOMEN: Soft, non-tender, non-distended. Bowel sounds present. No organomegaly or mass.  EXTREMITIES: No pedal edema, cyanosis, or clubbing.  NEUROLOGIC: Cranial nerves II through XII are intact. No focal motor or sensory defecits b/l. Globally weak PSYCHIATRIC: The patient is alert and oriented x 3.   SKIN: No obvious rash, lesion, or ulcer.   DATA REVIEW:   CBC Recent Labs  Lab 09/17/18 0504  WBC 4.7  HGB 7.4*  HCT 23.0*  PLT 279    Chemistries  Recent Labs  Lab 09/17/18 0504  NA 143  K 3.7  CL 106  CO2 27  GLUCOSE 71  BUN 40*  CREATININE 2.61*  CALCIUM 8.3*    Cardiac Enzymes Recent Labs  Lab 09/15/18 1838  TROPONINI 0.08*    Microbiology Results  Results for orders placed or performed during the hospital encounter of 09/09/18  MRSA PCR Screening     Status: None   Collection Time: 09/09/18  2:35 PM  Result Value Ref Range Status   MRSA by PCR NEGATIVE NEGATIVE Final    Comment:        The GeneXpert MRSA Assay (FDA approved for NASAL specimens only), is one component of a comprehensive MRSA colonization surveillance program. It is not intended to diagnose MRSA infection nor to guide or monitor treatment for MRSA infections. Performed at [facility], [place] [address] [place], [place] [address]     RADIOLOGY:  Dg Chest 2 View  Result Date: 09/16/2018 CLINICAL DATA:  CHF EXAM: CHEST - 2 VIEW COMPARISON:  09/15/2018 FINDINGS: Mild cardiomegaly. Interstitial prominence  throughout the lungs, slightly improved. Persistent bibasilar opacities and small effusions. IMPRESSION: Slight interval improvement in the diffuse interstitial prominence/edema. Continued small bilateral effusions with bibasilar atelectasis or infiltrates. Electronically Signed   By: Rolm Baptise M.D.   On: 09/16/2018 08:22      Management plans discussed with the patient, family and they are in agreement.  CODE STATUS:     Code Status Orders  (From admission, onward)         Start     Ordered   09/15/18 1448  Do not attempt resuscitation (DNR)  Continuous    Question Answer Comment  In the event of cardiac or respiratory ARREST Do not call a "code blue"   In the event of cardiac or respiratory ARREST Do not perform Intubation, CPR, defibrillation or ACLS   In the event of cardiac or respiratory ARREST Use medication by any route, position, wound care, and other measures to relive pain and suffering. May use oxygen, suction and manual treatment of airway obstruction as needed for comfort.      09/15/18 1447          TOTAL TIME TAKING CARE OF THIS PATIENT: 40 minutes.    Henreitta Leber M.D on 09/17/2018 at 4:15 PM  Between 7am to 6pm - Pager - [phone]  After 6pm go to [email or web address] - Proofreader  [facility]  Office  [phone]  CC: Primary care physician; Lucianne Lei, MD

## 2018-09-17 NOTE — Evaluation (Signed)
Physical Therapy Evaluation Patient Details Name: [name] MRN: [id number] DOB: 1926/10/28 Today's Date: 09/17/2018   History of Present Illness  Patient is a 84 year old female admitted from home for acute CHF following c/o SOB.  PMH of L great toe amputation, acute coronary syndrome, anemia, arthritis, systolic congestive heart failure, constipation, diabetes, dyslipidemia, hypertension, peripheral vascular disease.  Clinical Impression  Patient is a 84 year old female who lives in a one story home with her son.  She is independent with ADL's and use of SPC at baseline.  A&O x3 though she did report some confusion this morning which has resolved.  She is able to follow simple commands.  Pt presented with overall fair strength of UE's and fair-good strength of LE's.  Pt able to perform bed mobility mod I and sit at EOB without difficulty.  She reported to be asymptomatic and O2 remained at 90-91% on 2L O2 throughout evaluation.  Pt able to stand from bedside without assistance, demonstrating mild difficulty and requiring use of UE's.  Pt ambulated 200 ft with RW and some gait deviations indicative of fall risk.  Pt able to perform standing functional tasks with heavy unilateral support.  Pt will continue to benefit from skilled Pt with focus on strength, tolerance to activity and balance and fall prevention.    Follow Up Recommendations Home health PT;Supervision for mobility/OOB    Equipment Recommendations  None recommended by PT    Recommendations for Other Services       Precautions / Restrictions Precautions Precautions: Fall Restrictions Weight Bearing Restrictions: No      Mobility  Bed Mobility Overal bed mobility: Modified Independent Bed Mobility: Supine to Sit     Supine to sit: Modified independent (Device/Increase time);HOB elevated     General bed mobility comments: Use of handrails.  Transfers Overall transfer level: Needs assistance Equipment used:  None Transfers: Sit to/from Stand Sit to Stand: Supervision         General transfer comment: Pt able to stand from bedside with min effort and use of UE's to push from bed or RW.  Ambulation/Gait Ambulation/Gait assistance: Supervision Gait Distance (Feet): 200 Feet Assistive device: Rolling walker (2 wheeled)     Gait velocity interpretation: 1.31 - 2.62 ft/sec, indicative of limited community ambulator General Gait Details: Pt more comfortable using RW at this time.  She presented with some unsteadiness on feet and gait deviations to L and R side with use of RW.  She did present with confusion when asking for directions back to room.  Pt did not report fatigue and remained at 90% O2 on 2 L of O2.  Stairs            Wheelchair Mobility    Modified Rankin (Stroke Patients Only)       Balance Overall balance assessment: Needs assistance Sitting-balance support: Feet supported;No upper extremity supported Sitting balance-Leahy Scale: Good     Standing balance support: During functional activity;Bilateral upper extremity supported Standing balance-Leahy Scale: Fair Standing balance comment: Requires min/mod UE support to bend to lift object from floor.  PT addressed importance of remaining independent but also asking for help when pt feels safety could be compromised.                             Pertinent Vitals/Pain Pain Assessment: No/denies pain    Home Living Family/patient expects to be discharged to:: Private residence Living Arrangements:  Children Available Help at Discharge: Family;Available 24 hours/day Type of Home: House Home Access: Stairs to enter Entrance Stairs-Rails: Right Entrance Stairs-Number of Steps: 2 Home Layout: One level Home Equipment: Shower seat;Grab bars - toilet;Grab bars - tub/shower;Cane - single point;Walker - 2 wheels      Prior Function Level of Independence: Independent with assistive device(s)          Comments:  Uses RW as needed.  Pt lives with her son who does most of the cooking and cleaning.  Independent with ADL's.     Hand Dominance   Dominant Hand: Right    Extremity/Trunk Assessment   Upper Extremity Assessment Upper Extremity Assessment: Generalized weakness    Lower Extremity Assessment Lower Extremity Assessment: Overall WFL for tasks assessed(No report of sensation being impaired but reports some tingling in bilateral feet.  PT noted L great toe amputation.)    Cervical / Trunk Assessment Cervical / Trunk Assessment: Normal  Communication   Communication: No difficulties  Cognition Arousal/Alertness: Awake/alert Behavior During Therapy: WFL for tasks assessed/performed Overall Cognitive Status: History of cognitive impairments - at baseline                                 General Comments: Documented hx of dementia.  pt able to follow commands consistently and A&O x3.  Pt did state that she felt her room had been rearranged when she awoke this morning but that this was resolved by her drinking orange juice.  Pt feels she had become hypoglycemic for this reason.      General Comments      Exercises Other Exercises Other Exercises: Discussed importance of use of RW for fall prevention and process of weaning back to use of SPC if appropriate. x3 min Other Exercises: Educated regarding importance of consistency with O2 use. x 2 min Other Exercises: Educated regarding nature of [place] PT and importance of HEP and consistent exercise. x3 min   Assessment/Plan    PT Assessment Patient needs continued PT services  PT Problem List Decreased strength;Decreased mobility;Decreased safety awareness;Decreased balance;Decreased activity tolerance;Decreased cognition;Decreased knowledge of use of DME       PT Treatment Interventions Functional mobility training;Balance training;Patient/family education;Gait training;Therapeutic activities;Stair  training;Therapeutic exercise;DME instruction    PT Goals (Current goals can be found in the Care Plan section)  Acute Rehab PT Goals Patient Stated Goal: to resume general daily activity at home with use of SPC. PT Goal Formulation: With patient/family Time For Goal Achievement: 10/01/18 Potential to Achieve Goals: Good    Frequency Min 2X/week   Barriers to discharge        Co-evaluation               AM-PAC PT "6 Clicks" Daily Activity  Outcome Measure Difficulty turning over in bed (including adjusting bedclothes, sheets and blankets)?: A Little Difficulty moving from lying on back to sitting on the side of the bed? : A Little Difficulty sitting down on and standing up from a chair with arms (e.g., wheelchair, bedside commode, etc,.)?: A Little Help needed moving to and from a bed to chair (including a wheelchair)?: A Little Help needed walking in hospital room?: A Little Help needed climbing 3-5 steps with a railing? : A Lot 6 Click Score: 17    End of Session Equipment Utilized During Treatment: Gait belt;Oxygen Activity Tolerance: Patient tolerated treatment well Patient left: in bed;with call bell/phone within reach;with  bed alarm set;with family/visitor present   PT Visit Diagnosis: Unsteadiness on feet (R26.81);Muscle weakness (generalized) (M62.81)    Time: 2353-6144 PT Time Calculation (min) (ACUTE ONLY): 28 min   Charges:   PT Evaluation $PT Eval Low Complexity: 1 Low PT Treatments $Therapeutic Activity: 8-22 mins        Roxanne Gates, PT, DPT   Roxanne Gates 09/17/2018, 2:55 PM

## 2018-09-19 ENCOUNTER — Other Ambulatory Visit: Payer: Self-pay | Admitting: *Deleted

## 2018-09-19 ENCOUNTER — Inpatient Hospital Stay: Payer: Medicare Other

## 2018-09-19 DIAGNOSIS — N184 Chronic kidney disease, stage 4 (severe): Secondary | ICD-10-CM | POA: Diagnosis not present

## 2018-09-19 DIAGNOSIS — I5023 Acute on chronic systolic (congestive) heart failure: Secondary | ICD-10-CM | POA: Diagnosis not present

## 2018-09-19 DIAGNOSIS — D631 Anemia in chronic kidney disease: Secondary | ICD-10-CM | POA: Diagnosis not present

## 2018-09-19 DIAGNOSIS — E1122 Type 2 diabetes mellitus with diabetic chronic kidney disease: Secondary | ICD-10-CM | POA: Diagnosis not present

## 2018-09-19 NOTE — Patient Outreach (Signed)
[facility]) Care Management  09/19/2018  BETTY DAIDONE October 03, 1926 [id number]   Referral received from hospital liaison as member was recently admitted to hospital for heart failure 10/11-10/15, readmitted 10/17-10/19 again for heart failure.  Primary MD office will complete transition of care assessment.  Per chart, she has history of stroke, hypertension, PAD, ACS, diabetes, CKD, and dyslipidemia.  Noted that granddaughter Peggye Form is contact person.    Call placed to [place], no answer.  HIPAA compliant voice message left.  Unsuccessful outreach letter sent.  Will follow up within the next 4 business days if no call back.  Valente David, [place], MSN [place] [phone]

## 2018-09-20 ENCOUNTER — Observation Stay (HOSPITAL_COMMUNITY): Payer: Medicare Other

## 2018-09-20 ENCOUNTER — Observation Stay (HOSPITAL_BASED_OUTPATIENT_CLINIC_OR_DEPARTMENT_OTHER): Payer: Medicare Other

## 2018-09-20 ENCOUNTER — Observation Stay (HOSPITAL_COMMUNITY)
Admission: EM | Admit: 2018-09-20 | Discharge: 2018-09-22 | Disposition: A | Discharge status: Home / Self Care | Payer: Medicare Other | Attending: Internal Medicine | Admitting: Internal Medicine

## 2018-09-20 ENCOUNTER — Emergency Department (HOSPITAL_COMMUNITY): Payer: Medicare Other

## 2018-09-20 ENCOUNTER — Other Ambulatory Visit: Payer: Self-pay | Admitting: *Deleted

## 2018-09-20 DIAGNOSIS — M6281 Muscle weakness (generalized): Secondary | ICD-10-CM | POA: Insufficient documentation

## 2018-09-20 DIAGNOSIS — I959 Hypotension, unspecified: Secondary | ICD-10-CM | POA: Diagnosis not present

## 2018-09-20 DIAGNOSIS — E785 Hyperlipidemia, unspecified: Secondary | ICD-10-CM | POA: Diagnosis not present

## 2018-09-20 DIAGNOSIS — N184 Chronic kidney disease, stage 4 (severe): Secondary | ICD-10-CM | POA: Diagnosis not present

## 2018-09-20 DIAGNOSIS — R4701 Aphasia: Secondary | ICD-10-CM | POA: Diagnosis not present

## 2018-09-20 DIAGNOSIS — G459 Transient cerebral ischemic attack, unspecified: Secondary | ICD-10-CM

## 2018-09-20 DIAGNOSIS — I10 Essential (primary) hypertension: Secondary | ICD-10-CM | POA: Diagnosis present

## 2018-09-20 DIAGNOSIS — R404 Transient alteration of awareness: Secondary | ICD-10-CM | POA: Diagnosis not present

## 2018-09-20 DIAGNOSIS — Z79899 Other long term (current) drug therapy: Secondary | ICD-10-CM | POA: Insufficient documentation

## 2018-09-20 DIAGNOSIS — Z09 Encounter for follow-up examination after completed treatment for conditions other than malignant neoplasm: Secondary | ICD-10-CM

## 2018-09-20 DIAGNOSIS — E1122 Type 2 diabetes mellitus with diabetic chronic kidney disease: Secondary | ICD-10-CM | POA: Diagnosis not present

## 2018-09-20 DIAGNOSIS — I130 Hypertensive heart and chronic kidney disease with heart failure and stage 1 through stage 4 chronic kidney disease, or unspecified chronic kidney disease: Secondary | ICD-10-CM | POA: Insufficient documentation

## 2018-09-20 DIAGNOSIS — Z7984 Long term (current) use of oral hypoglycemic drugs: Secondary | ICD-10-CM | POA: Diagnosis not present

## 2018-09-20 DIAGNOSIS — J90 Pleural effusion, not elsewhere classified: Secondary | ICD-10-CM | POA: Diagnosis not present

## 2018-09-20 DIAGNOSIS — I739 Peripheral vascular disease, unspecified: Secondary | ICD-10-CM | POA: Diagnosis not present

## 2018-09-20 DIAGNOSIS — R0902 Hypoxemia: Secondary | ICD-10-CM | POA: Diagnosis not present

## 2018-09-20 DIAGNOSIS — D649 Anemia, unspecified: Secondary | ICD-10-CM | POA: Diagnosis present

## 2018-09-20 DIAGNOSIS — T68XXXA Hypothermia, initial encounter: Secondary | ICD-10-CM | POA: Diagnosis present

## 2018-09-20 DIAGNOSIS — I5043 Acute on chronic combined systolic (congestive) and diastolic (congestive) heart failure: Secondary | ICD-10-CM | POA: Diagnosis present

## 2018-09-20 DIAGNOSIS — Z7902 Long term (current) use of antithrombotics/antiplatelets: Secondary | ICD-10-CM | POA: Diagnosis not present

## 2018-09-20 DIAGNOSIS — R410 Disorientation, unspecified: Secondary | ICD-10-CM | POA: Diagnosis not present

## 2018-09-20 DIAGNOSIS — I5023 Acute on chronic systolic (congestive) heart failure: Secondary | ICD-10-CM | POA: Diagnosis not present

## 2018-09-20 DIAGNOSIS — R4781 Slurred speech: Secondary | ICD-10-CM | POA: Diagnosis not present

## 2018-09-20 DIAGNOSIS — R1312 Dysphagia, oropharyngeal phase: Secondary | ICD-10-CM | POA: Diagnosis not present

## 2018-09-20 DIAGNOSIS — Z7982 Long term (current) use of aspirin: Secondary | ICD-10-CM | POA: Diagnosis not present

## 2018-09-20 LAB — DIFFERENTIAL
Abs Immature Granulocytes: 0.02 10*3/uL (ref 0.00–0.07)
BASOS PCT: 0 %
Basophils Absolute: 0 10*3/uL (ref 0.0–0.1)
EOS ABS: 0.1 10*3/uL (ref 0.0–0.5)
Eosinophils Relative: 2 %
Immature Granulocytes: 0 %
Lymphocytes Relative: 28 %
Lymphs Abs: 1.3 10*3/uL (ref 0.7–4.0)
MONO ABS: 0.5 10*3/uL (ref 0.1–1.0)
MONOS PCT: 12 %
NEUTROS PCT: 58 %
Neutro Abs: 2.7 10*3/uL (ref 1.7–7.7)

## 2018-09-20 LAB — I-STAT TROPONIN, ED: TROPONIN I, POC: 0.04 ng/mL (ref 0.00–0.08)

## 2018-09-20 LAB — I-STAT CHEM 8, ED
BUN: 36 mg/dL — AB (ref 8–23)
CHLORIDE: 103 mmol/L (ref 98–111)
CREATININE: 3 mg/dL — AB (ref 0.44–1.00)
Calcium, Ion: 1.07 mmol/L — ABNORMAL LOW (ref 1.15–1.40)
Glucose, Bld: 141 mg/dL — ABNORMAL HIGH (ref 70–99)
HEMATOCRIT: 22 % — AB (ref 36.0–46.0)
Hemoglobin: 7.5 g/dL — ABNORMAL LOW (ref 12.0–15.0)
POTASSIUM: 4 mmol/L (ref 3.5–5.1)
Sodium: 138 mmol/L (ref 135–145)
TCO2: 26 mmol/L (ref 22–32)

## 2018-09-20 LAB — CBG MONITORING, ED: GLUCOSE-CAPILLARY: 122 mg/dL — AB (ref 70–99)

## 2018-09-20 LAB — COMPREHENSIVE METABOLIC PANEL
ALT: 31 U/L (ref 0–44)
AST: 59 U/L — ABNORMAL HIGH (ref 15–41)
Albumin: 2.8 g/dL — ABNORMAL LOW (ref 3.5–5.0)
Alkaline Phosphatase: 80 U/L (ref 38–126)
Anion gap: 11 (ref 5–15)
BUN: 38 mg/dL — ABNORMAL HIGH (ref 8–23)
CO2: 23 mmol/L (ref 22–32)
Calcium: 8.9 mg/dL (ref 8.9–10.3)
Chloride: 101 mmol/L (ref 98–111)
Creatinine, Ser: 2.69 mg/dL — ABNORMAL HIGH (ref 0.44–1.00)
GFR, EST AFRICAN AMERICAN: 17 mL/min — AB (ref >60)
GFR, EST NON AFRICAN AMERICAN: 15 mL/min — AB (ref >60)
Glucose, Bld: 142 mg/dL — ABNORMAL HIGH (ref 70–99)
POTASSIUM: 3.9 mmol/L (ref 3.5–5.1)
Sodium: 135 mmol/L (ref 135–145)
Total Bilirubin: 0.4 mg/dL (ref 0.3–1.2)
Total Protein: 7.7 g/dL (ref 6.5–8.1)

## 2018-09-20 LAB — CBC
HCT: 22.8 % — ABNORMAL LOW (ref 36.0–46.0)
Hemoglobin: 7 g/dL — ABNORMAL LOW (ref 12.0–15.0)
MCH: 24.9 pg — ABNORMAL LOW (ref 26.0–34.0)
MCHC: 30.7 g/dL (ref 30.0–36.0)
MCV: 81.1 fL (ref 80.0–100.0)
PLATELETS: 279 10*3/uL (ref 150–400)
RBC: 2.81 MIL/uL — AB (ref 3.87–5.11)
RDW: 17.9 % — ABNORMAL HIGH (ref 11.5–15.5)
WBC: 4.6 10*3/uL (ref 4.0–10.5)
nRBC: 0.4 % — ABNORMAL HIGH (ref 0.0–0.2)

## 2018-09-20 LAB — POC OCCULT BLOOD, ED: FECAL OCCULT BLD: NEGATIVE

## 2018-09-20 LAB — PREPARE RBC (CROSSMATCH)

## 2018-09-20 LAB — PROTIME-INR
INR: 1.13
Prothrombin Time: 14.4 seconds (ref 11.4–15.2)

## 2018-09-20 LAB — ETHANOL: Alcohol, Ethyl (B): <10 mg/dL (ref <10)

## 2018-09-20 LAB — APTT: APTT: 31 s (ref 24–36)

## 2018-09-20 MED ORDER — FUROSEMIDE 10 MG/ML IJ SOLN
40.0000 mg | Freq: Every day | INTRAMUSCULAR | Status: DC
  Administered 2018-09-21 – 2018-09-22 (×2): 40 mg via INTRAVENOUS
  Filled 2018-09-20 (×2): qty 4

## 2018-09-20 MED ORDER — ACETAMINOPHEN 160 MG/5ML PO SOLN: 650.0000 mg | ORAL | Status: DC | PRN

## 2018-09-20 MED ORDER — ASPIRIN EC 81 MG PO TBEC
81.0000 mg | DELAYED_RELEASE_TABLET | Freq: Every day | ORAL | Status: DC
  Administered 2018-09-21 – 2018-09-22 (×2): 81 mg via ORAL
  Filled 2018-09-20 (×2): qty 1

## 2018-09-20 MED ORDER — FUROSEMIDE 10 MG/ML IJ SOLN
20.0000 mg | Freq: Once | INTRAMUSCULAR | Status: AC
  Administered 2018-09-20: 20 mg via INTRAVENOUS
  Filled 2018-09-20: qty 2

## 2018-09-20 MED ORDER — STROKE: EARLY STAGES OF RECOVERY BOOK
Freq: Once | Status: AC
  Administered 2018-09-20: 19:00:00
  Filled 2018-09-20: qty 1

## 2018-09-20 MED ORDER — GLIPIZIDE 5 MG PO TABS
2.5000 mg | ORAL_TABLET | Freq: Two times a day (BID) | ORAL | Status: DC
  Administered 2018-09-21 – 2018-09-22 (×2): 2.5 mg via ORAL
  Filled 2018-09-20 (×3): qty 1

## 2018-09-20 MED ORDER — ACETAMINOPHEN 650 MG RE SUPP: 650.0000 mg | RECTAL | Status: DC | PRN

## 2018-09-20 MED ORDER — POLYETHYLENE GLYCOL 3350 17 G PO PACK
17.0000 g | PACK | Freq: Every day | ORAL | Status: DC | PRN
  Filled 2018-09-20: qty 1

## 2018-09-20 MED ORDER — SODIUM CHLORIDE 0.9% IV SOLUTION
Freq: Once | INTRAVENOUS | Status: AC
  Administered 2018-09-20: 15:00:00 via INTRAVENOUS

## 2018-09-20 MED ORDER — CARVEDILOL 12.5 MG PO TABS
12.5000 mg | ORAL_TABLET | Freq: Two times a day (BID) | ORAL | Status: DC
  Administered 2018-09-21 – 2018-09-22 (×3): 12.5 mg via ORAL
  Filled 2018-09-20 (×3): qty 1

## 2018-09-20 MED ORDER — CLOPIDOGREL BISULFATE 75 MG PO TABS
75.0000 mg | ORAL_TABLET | Freq: Every day | ORAL | Status: DC
  Administered 2018-09-21 – 2018-09-22 (×2): 75 mg via ORAL
  Filled 2018-09-20 (×2): qty 1

## 2018-09-20 MED ORDER — PANTOPRAZOLE SODIUM 20 MG PO TBEC
20.0000 mg | DELAYED_RELEASE_TABLET | Freq: Every day | ORAL | Status: DC
  Filled 2018-09-20 (×2): qty 1

## 2018-09-20 MED ORDER — ACETAMINOPHEN 325 MG PO TABS
650.0000 mg | ORAL_TABLET | ORAL | Status: DC | PRN
  Administered 2018-09-21: 650 mg via ORAL
  Filled 2018-09-20: qty 2

## 2018-09-20 NOTE — H&P (Signed)
4        History and Physical    [name] WRU:[id number] DOB: 11/13/1926 DOA: 09/20/2018  PCP: Lucianne Lei, MD   Patient coming from: Home.  I have personally briefly reviewed patient's old medical records in [place]  Chief Complaint: Mini stroke.  HPI: [name] is a 84 y.o. female with medical history significant of CAD, history of acute coronary syndrome, chronic combined systolic and diastolic heart failure, history of left calf ulcer, constipation, type 2 diabetes, hyperlipidemia, dyspnea, GERD, hypertension, peripheral vascular disease who was recently admitted from 09/15/2018 to 09/17/2018 at [facility] due to hypoxia secondary to congestive heart failure and is being brought to the emergency department now after having sudden onset of slurred speech, expressive aphasia and left-sided facial droop while she was talking to her son.  He asked the neighbor for help, who notified 911 and the patient was brought to the emergency department here via EMS.  Her symptoms have resolved spontaneously since then.  There has not being any fever, chills, sore throat, rhinorrhea, productive cough, chest pain, palpitations, but complains of dyspnea, lower extremity edema and orthopnea.  No abdominal pain, nausea, emesis, diarrhea, melena or hematochezia.  There is occasional constipation.  She denies dysuria, frequency or hematuria.  No polyuria, polydipsia, polyphagia or blurred vision.  No heat or cold intolerance.  Denies skin pruritus.  ED Course: Initial vital signs temperature 95.2 F, pulse 73, respirations 16, blood pressure 126/84 mmHg O2 sat 97% on room air.  The patient was ordered supplemental oxygen, 1 unit of PRBC and 20 mg of furosemide IVP x1.  Fecal occult blood was negative.  White count was 4.6 with a normal differential, hemoglobin 7.0 g/dL and platelets 279.  PT/INR/PTT were within normal limits.  Alcohol level was normal.  CMP shows normal electrolytes.  BUN was  38, creatinine 2.69 and glucose 142 mg/dL.  Albumin was 2.8 g/dL and AST was mildly elevated at 59 units/L.  All other CMP values are within normal limits.  Imaging: Her chest radiograph showed stable cardiomegaly with stable bilateral pulmonary edema or atelectasis.  CT brain did not show any acute intracranial pathology.  Please see images and full radiology reports for further detail.  Review of Systems: As per HPI otherwise 10 point review of systems negative.   Past Medical History:  Diagnosis Date  . ACS (acute coronary syndrome) ([place]) 08/2018  . Anemia    family unsure of this history  . Arthritis   . CHF (congestive heart failure) ([place])   . Chronic ulcer of calf (HCC)    Left  . Constipation   . Diabetes mellitus without complication ([place])   . Dyslipidemia   . Dyspnea   . GERD (gastroesophageal reflux disease)   . Hypertension   . Peripheral vascular disease [facility])     Past Surgical History:  Procedure Laterality Date  . ABDOMINAL AORTOGRAM W/LOWER EXTREMITY Left 12/27/2017   Procedure: ABDOMINAL AORTOGRAM W/LOWER EXTREMITY;  Surgeon: Algernon Huxley, MD;  Location: [facility];  Service: Cardiovascular;  Laterality: Left;  . ABDOMINAL HYSTERECTOMY    . AMPUTATION TOE Left 12/23/2017   Procedure: AMPUTATION TOE LEFT GREAT TOE;  Surgeon: Sharlotte Alamo, DPM;  Location: [facility] ORS;  Service: Podiatry;  Laterality: Left;  . ENDARTERECTOMY FEMORAL Left 09/08/2017   Procedure: ENDARTERECTOMY FEMORAL WITH CORMATRIX PATCH;  Surgeon: Algernon Huxley, MD;  Location: [facility] ORS;  Service: Vascular;  Laterality: Left;  . EYE SURGERY Bilateral  Cataract Extraction with IOL  . LOWER EXTREMITY ANGIOGRAPHY Left 07/22/2017   Procedure: Lower Extremity Angiography;  Surgeon: Algernon Huxley, MD;  Location: [place] [facility];  Service: Cardiovascular;  Laterality: Left;  . LOWER EXTREMITY ANGIOGRAPHY Left 08/16/2017   Procedure: Lower Extremity Angiography;  Surgeon: Algernon Huxley, MD;  Location:  [place] [facility];  Service: Cardiovascular;  Laterality: Left;  Marland Kitchen MULTIPLE TOOTH EXTRACTIONS       reports that she has never smoked. She has never used smokeless tobacco. She reports that she does not drink alcohol or use drugs.  No Known Allergies  Family History  Problem Relation Age of Onset  . Hypertension Father     Prior to Admission medications   Medication Sig Start Date End Date Taking? Authorizing Provider  acetaminophen (TYLENOL) 325 MG tablet Take 2 tablets (650 mg total) by mouth every 6 (six) hours as needed for mild pain or headache (or Fever >/= 101). 09/13/18  Yes Roxan Hockey, MD  aspirin EC 81 MG tablet Take 1 tablet (81 mg total) by mouth daily. With food Patient taking differently: Take 81 mg by mouth daily.  09/13/18  Yes Emokpae, Courage, MD  Calcium Carbonate-Vitamin D (CALCIUM 500 + D) 500-125 MG-UNIT TABS Take 1 tablet by mouth daily.    Yes [provider]  carvedilol (COREG) 12.5 MG tablet Take 1 tablet (12.5 mg total) by mouth 2 (two) times daily with a meal. 09/13/18  Yes Emokpae, Courage, MD  clopidogrel (PLAVIX) 75 MG tablet Take 1 tablet (75 mg total) by mouth daily with breakfast. 09/13/18  Yes Emokpae, Courage, MD  furosemide (LASIX) 40 MG tablet Take 1 tablet (40 mg total) by mouth daily. 09/17/18 11/16/18 Yes Sainani, Belia Heman, MD  glipiZIDE (GLUCOTROL) 5 MG tablet Take 0.5 tablets (2.5 mg total) by mouth 2 (two) times daily before a meal. Always take with food 09/13/18  Yes Emokpae, Courage, MD  pantoprazole (PROTONIX) 20 MG tablet Take 1 tablet (20 mg total) by mouth daily. 07/13/14  Yes Kindl, Nelda Severe, MD  polyethylene glycol (MIRALAX / GLYCOLAX) packet Take 17 g by mouth daily as needed (for constipation.).    Yes [provider]  ACCU-CHEK FASTCLIX LANCETS MISC TEST TID 12/31/17   [provider]  ACCU-CHEK GUIDE test strip  12/31/17   [provider]  atorvastatin (LIPITOR) 10 MG tablet Take 1 tablet (10 mg  total) by mouth daily. Patient not taking: Reported on 09/20/2018 09/13/18   Roxan Hockey, MD  Blood Glucose Monitoring Suppl (ACCU-CHEK GUIDE) w/Device KIT See admin instructions. 12/28/17   [provider]    Physical Exam: Vitals:   09/20/18 0915 09/20/18 0930 09/20/18 0945 09/20/18 1008  BP: 120/70 (!) 141/82 130/78   Pulse: 68 71 71   Resp: 19 (!) 21 (!) 26   Temp:    (!) 93.9 F (34.4 C)  TempSrc:    Rectal  SpO2: 96% 95% 98%     Constitutional: Frail, elderly female, but in NAD, calm, comfortable Eyes: PERRL, lids and conjunctivae pale. ENMT: Mucous membranes are moist. Posterior pharynx clear of any exudate or lesions. Neck: Normal, supple, no JVD, no masses, no thyromegaly. Respiratory: Decreased breath sounds with bibasilar crackles. Normal respiratory effort. No accessory muscle use.  Cardiovascular: Regular rate and rhythm, no murmurs / rubs / gallops. No extremity edema. 2+ pedal pulses. No carotid bruits.  Abdomen: Soft, no tenderness, no masses palpated. No hepatosplenomegaly. Bowel sounds positive.  Musculoskeletal: no clubbing /  cyanosis.  Left first toe amputation.  Left pretibial scar from chronic ulceration.  Good ROM, no contractures. Normal muscle tone.  Skin: no rashes, lesions, ulcers. No induration on very limited dermatological examination. Neurologic: CN 2-12 grossly intact. Sensation intact, DTR normal. Strength 5/5 in all 4.  Psychiatric: Alert and oriented x 2, partially oriented to time and situation.  Depressed mood.   Labs on Admission: I have personally reviewed following labs and imaging studies  CBC: Recent Labs  Lab 09/15/18 0738 09/15/18 1509 09/16/18 0601 09/17/18 0504 09/20/18 0648 09/20/18 0700  WBC 5.7 5.0 4.7 4.7 4.6  --   NEUTROABS 3.6  --   --   --  2.7  --   HGB 8.2* 8.4* 7.6* 7.4* 7.0* 7.5*  HCT 25.8* 26.6* 23.6* 23.0* 22.8* 22.0*  MCV 81.6 83.1 80.8 80.4 81.1  --   PLT 324 308 283 279 279  --    Basic Metabolic  Panel: Recent Labs  Lab 09/13/18 1130 09/15/18 0738 09/15/18 1509 09/16/18 0601 09/17/18 0504 09/20/18 0648 09/20/18 0700  NA 139 138  --  143 143 135 138  K 3.9 3.7  --  3.5 3.7 3.9 4.0  CL 105 101  --  105 106 101 103  CO2 24 24  --  26 27 23   --   GLUCOSE 190* 138*  --  111* 71 142* 141*  BUN 33* 40*  --  36* 40* 38* 36*  CREATININE 2.73* 2.54* 2.33* 2.32* 2.61* 2.69* 3.00*  CALCIUM 8.5* 8.5*  --  8.5* 8.3* 8.9  --    GFR: Estimated Creatinine Clearance: 10.9 mL/min (A) (by C-G formula based on SCr of 3 mg/dL (H)). Liver Function Tests: Recent Labs  Lab 09/20/18 0648  AST 59*  ALT 31  ALKPHOS 80  BILITOT 0.4  PROT 7.7  ALBUMIN 2.8*   No results for input(s): LIPASE, AMYLASE in the last 168 hours. No results for input(s): AMMONIA in the last 168 hours. Coagulation Profile: Recent Labs  Lab 09/20/18 0648  INR 1.13   Cardiac Enzymes: Recent Labs  Lab 09/15/18 0738 09/15/18 1205 09/15/18 1509 09/15/18 1838  TROPONINI 0.04* 0.06* 0.07* 0.08*   BNP (last 3 results) No results for input(s): PROBNP in the last 8760 hours. HbA1C: No results for input(s): HGBA1C in the last 72 hours. CBG: Recent Labs  Lab 09/16/18 2111 09/17/18 0624 09/17/18 0743 09/17/18 1216 09/20/18 0912  GLUCAP 172* 80 73 103* 122*   Lipid Profile: No results for input(s): CHOL, HDL, LDLCALC, TRIG, CHOLHDL, LDLDIRECT in the last 72 hours. Thyroid Function Tests: No results for input(s): TSH, T4TOTAL, FREET4, T3FREE, THYROIDAB in the last 72 hours. Anemia Panel: No results for input(s): VITAMINB12, FOLATE, FERRITIN, TIBC, IRON, RETICCTPCT in the last 72 hours. Urine analysis:    Component Value Date/Time   COLORURINE YELLOW (A) 09/17/2018 1159   APPEARANCEUR CLEAR (A) 09/17/2018 1159   LABSPEC 1.016 09/17/2018 1159   PHURINE 5.0 09/17/2018 1159   GLUCOSEU NEGATIVE 09/17/2018 1159   HGBUR NEGATIVE 09/17/2018 1159   BILIRUBINUR NEGATIVE 09/17/2018 1159   KETONESUR NEGATIVE  09/17/2018 1159   PROTEINUR NEGATIVE 09/17/2018 1159   UROBILINOGEN 1.0 09/07/2016 1403   NITRITE NEGATIVE 09/17/2018 1159   LEUKOCYTESUR TRACE (A) 09/17/2018 1159    Radiological Exams on Admission: Dg Chest 2 View  Result Date: 09/20/2018 CLINICAL DATA:  Weakness. EXAM: CHEST - 2 VIEW COMPARISON:  Radiographs of September 16, 2018. FINDINGS: Stable cardiomegaly. Atherosclerosis of thoracic aorta is noted.  No pneumothorax is noted. Stable bibasilar atelectasis or edema is noted with associated pleural effusions. Bony thorax is unremarkable. IMPRESSION: Stable bilateral pulmonary edema or atelectasis is noted with associated pleural effusions. Aortic Atherosclerosis (ICD10-I70.0). Electronically Signed   By: Marijo Conception, M.D.   On: 09/20/2018 08:42   Ct Head Wo Contrast  Result Date: 09/20/2018 CLINICAL DATA:  Slurred speech, transient EXAM: CT HEAD WITHOUT CONTRAST TECHNIQUE: Contiguous axial images were obtained from the base of the skull through the vertex without intravenous contrast. COMPARISON:  Head CT December 31, 2013 and brain MRI December 31, 2013 FINDINGS: Brain: Moderate diffuse atrophy is stable. There is no intracranial mass, hemorrhage, extra-axial fluid collection, or midline shift. There is small vessel disease throughout the centra semiovale bilaterally. There is evidence of a prior small infarct in the lateral left thalamus. There is small vessel disease throughout portions of the internal and external capsule regions bilaterally. Small vessel disease in the mid brain region is present, somewhat better seen on prior studies. No acute infarct is demonstrated currently. Vascular: There are no appreciable hyperdense vessels. There is calcification in each distal vertebral artery and carotid siphon region. Skull: The bony calvarium appears intact. Sinuses/Orbits: There is a retention cyst in the medial left maxillary antrum. There is mucosal thickening in several ethmoid air cells.  Orbits appear symmetric bilaterally. Patient has had cataract removals bilaterally. Other: Mastoid air cells are clear. IMPRESSION: Atrophy with extensive supratentorial small vessel disease. Midbrain small vessel disease is present, better seen on prior studies. No acute infarct appreciable. No mass or hemorrhage. There are multiple foci of arterial vascular calcification. There are foci of paranasal sinus disease. Electronically Signed   By: Lowella Grip III M.D.   On: 09/20/2018 08:19   09/09/2018 echocardiogram ------------------------------------------------------------------- LV EF: 20% -   25%  ------------------------------------------------------------------- Indications:      MI - acute 410.91.  ------------------------------------------------------------------- History:   PMH:  GERD. Peripheral Vascular Disease.  Risk factors: Hypertension. Diabetes mellitus. Dyslipidemia.  ------------------------------------------------------------------- Study Conclusions  - Procedure narrative: Transthoracic echocardiography. Image   quality was adequate. The study was technically difficult, as a   result of poor sound wave transmission. Intravenous contrast   (Definity) was administered. - Left ventricle: The cavity size was normal. Wall thickness was   increased in a pattern of mild LVH. Systolic function was   severely reduced. The estimated ejection fraction was in the   range of 20% to 25%. No apical thrombus with Definity contrast.   Severe apical hypokinesis with preserved basal function,   suggestive of possible Takatsubo cardiomyopathy or multivessel   coronary artery disease. Doppler parameters are consistent with   abnormal left ventricular relaxation (grade 1 diastolic   dysfunction). LV filling pressures are markedly elevated. - Aortic valve: Sclerosis without stenosis. There was mild   regurgitation. Valve area (Vmax): 1.28 cm^2. - Mitral valve: Mildly thickened  leaflets . Sclerotic anterior   leaflet. There was mild to moderate regurgitation. - Left atrium: The atrium was normal in size. - Tricuspid valve: There was moderate regurgitation. - Pulmonary arteries: PA peak pressure: 56 mm Hg (S). - Inferior vena cava: The vessel was dilated. The respirophasic   diameter changes were blunted (< 50%), consistent with elevated   central venous pressure. - Pericardium, extracardiac: There was no pericardial effusion.   There was a left pleural effusion.  Impressions:  - Technically difficult study. Definity contrast given. Compared to   a prior study in 2015, the LVEF is  markedly lower at 20-25% with   predominantly mid to distal anterior, apical, inferior and   lateral wall hypokinesis with a preserved basal function,   suggestive of possible Takatsubo cardimyopathy or multivessel   coronary artery disease. LV filling pressure is very high. RVSP   is 56 mmHg with a dilated IVC and left pleural effusion.  EKG: Independently reviewed.  Vent. rate 70 BPM PR interval * ms QRS duration 96 ms QT/QTc 449/485 ms P-R-T axes 79 82 245 Sinus rhythm Probable anterior infarct, age indeterminate Lateral leads are also involved No significant change since last tracing  Assessment/Plan Principal Problem:   TIA (transient ischemic attack) Observation/stepdown. Frequent neuro checks. PT/OT/SLP. Swallow screen. Consult case management. Check carotid Doppler. She had a recent echocardiogram 11 days ago. Continue aspirin. Check MR MRA to brain.  Active Problems:   Acute on chronic combined systolic and diastolic congestive heart failure (HCC) Worsened by secondary to symptomatic anemia and weakened state. Continue supplemental oxygen. PRBC transfusion ordered along with furosemide. Continue IV furosemide. Hold carvedilol this morning. Daily weights. Monitor intake and output. Follow-up renal function and electrolytes. She was seen by  palliative care last week [place]. Given her advanced age, renal and cardiac disease her prognosis is poor. Her family is debating    Hypothermia Central versus infectious. Upgraded to stepdown. Warming measures. Awaiting for urinalysis results.    Hypertension Switch furosemide to IV form. Hold carvedilol for now and resume later this evening. Monitor blood pressure, heart rate, BUN, creatinine and electrolytes.    Dyslipidemia Not on medical therapy. Continue lifestyle modifications.    Anemia Secondary to chronic blood loss. Her son would like consult with GI, but not sure about endoscopic procedures. She would need to improve clinically before even considering EGD/colonoscopy.    PAD (peripheral artery disease) (HCC) Continue aspirin and Plavix.    CKD (chronic kidney disease) stage 4, GFR 15-29 ml/min (HCC) Monitor renal function electrolytes.     DVT prophylaxis: SCDs. Code Status: Full code. Family Communication: Her son was present in the ED room. Disposition Plan: Observation for PRBC transfusion and further treatment. Consults called:  Admission status: Observation/stepdown.   Reubin Milan MD [facility] Pager [phone].  If 7PM-7AM, please contact night-coverage [email or web address] Password [facility]  09/20/2018, 11:28 AM

## 2018-09-20 NOTE — ED Triage Notes (Addendum)
Pt coming from home. Pt family states pt was having right sided lean and some slurred speech. Pt was then picked up by ems and had - LVO screen and all the symptoms have resolved since then LKW was 2300. Pt has hx of a TIA last one was 3 years ago.

## 2018-09-20 NOTE — CV Procedure (Signed)
Echocardiogram not completed, patient is undergoing other testing and is off the floor.  Darlina Sicilian RDCS

## 2018-09-20 NOTE — ED Notes (Signed)
Pt ambulated to restroom without complication °

## 2018-09-20 NOTE — Progress Notes (Signed)
Bilateral carotid duplex completed. 1% to 39% ICA stenosis. Vertebral artery flow is antegrade. [place] Nell Gales,RVS 09/20/2018 11:43 AM

## 2018-09-20 NOTE — ED Notes (Signed)
WALKED PATIENT TO THE BATHROOM PATIENT DID WELL PATIENT IS NOW BACK IN BED

## 2018-09-20 NOTE — ED Notes (Signed)
Report attempted 

## 2018-09-20 NOTE — ED Notes (Addendum)
Called receiving RN to let her know [facility] was bringing the patient and that the blood is complete only NS flush. Green sheet completed and in med rec drawer.

## 2018-09-20 NOTE — ED Notes (Signed)
[name] will transport.

## 2018-09-20 NOTE — Evaluation (Signed)
Physical Therapy Evaluation Patient Details Name: [name] MRN: [id number] DOB: 1926-10-20 Today's Date: 09/20/2018   History of Present Illness  [name] is a 84 y.o. female with medical history significant of CAD, history of acute coronary syndrome, chronic combined systolic and diastolic heart failure, history of left calf ulcer, constipation, type 2 diabetes, hyperlipidemia, dyspnea, GERD, hypertension, peripheral vascular disease who was recently admitted from 09/15/2018 to 09/17/2018 at [facility] due to hypoxia secondary to congestive heart failure and is being brought to the emergency department now after having sudden onset of slurred speech, expressive aphasia and left-sided facial droop while she was talking to her son.    Clinical Impression  Pt admitted with above diagnosis. Pt currently with functional limitations due to the deficits listed below (see PT Problem List). Pt's symptoms have resolved, no new deficits noted at this point. Will plan to follow pt if admitted, otherwise defer back to HHPT that was ordered from [facility].  Pt will benefit from skilled PT to increase their independence and safety with mobility to allow discharge to the venue listed below.       Follow Up Recommendations Home health PT;Supervision for mobility/OOB    Equipment Recommendations  None recommended by PT    Recommendations for Other Services       Precautions / Restrictions Precautions Precautions: Fall Restrictions Weight Bearing Restrictions: No      Mobility  Bed Mobility Overal bed mobility: Modified Independent Bed Mobility: Supine to Sit     Supine to sit: Modified independent (Device/Increase time);HOB elevated     General bed mobility comments: pt able to get to EOB without rail. Also able to return to bed on high gurney without assist  Transfers Overall transfer level: Needs assistance Equipment used: None Transfers: Sit to/from Stand Sit to Stand: Min  guard         General transfer comment: pt unsteady with initial standing but gained balance as she adjusted her feet  Ambulation/Gait Ambulation/Gait assistance: Min guard           General Gait Details: marching in place due to pt being in ED and short lines  Stairs            Wheelchair Mobility    Modified Rankin (Stroke Patients Only) Modified Rankin (Stroke Patients Only) Pre-Morbid Rankin Score: No symptoms Modified Rankin: No symptoms     Balance Overall balance assessment: Needs assistance Sitting-balance support: Feet supported;No upper extremity supported Sitting balance-Leahy Scale: Good     Standing balance support: No upper extremity supported Standing balance-Leahy Scale: Fair Standing balance comment: limitations evident with dynamic balance but able to stand statically and close eyes without LOB                             Pertinent Vitals/Pain Pain Assessment: No/denies pain    Home Living Family/patient expects to be discharged to:: Private residence Living Arrangements: Children Available Help at Discharge: Family;Available 24 hours/day Type of Home: House Home Access: Stairs to enter Entrance Stairs-Rails: Right Entrance Stairs-Number of Steps: 2 Home Layout: One level Home Equipment: Shower seat;Grab bars - toilet;Grab bars - tub/shower;Cane - single point;Walker - 2 wheels Additional Comments: pt lives with son who has had a stroke but he is independent and does some of the cooking    Prior Function Level of Independence: Independent with assistive device(s)         Comments:  Uses RW  as needed.  Pt lives with her son who does most of the cooking and cleaning.  Independent with ADL's.     Hand Dominance   Dominant Hand: Right    Extremity/Trunk Assessment   Upper Extremity Assessment Upper Extremity Assessment: Overall WFL for tasks assessed    Lower Extremity Assessment Lower Extremity Assessment:  Generalized weakness(L great toe amp, getting blood)    Cervical / Trunk Assessment Cervical / Trunk Assessment: Normal  Communication   Communication: No difficulties(slurring has resolved)  Cognition Arousal/Alertness: Awake/alert Behavior During Therapy: WFL for tasks assessed/performed Overall Cognitive Status: History of cognitive impairments - at baseline                                 General Comments: h/ dementia. Pt A&Ox3 and appropriate on eval      General Comments General comments (skin integrity, edema, etc.): son present. Pt on 2 L O2 and went home on it from [facility]    Exercises     Assessment/Plan    PT Assessment Patient needs continued PT services  PT Problem List Decreased strength;Decreased mobility;Decreased safety awareness;Decreased balance;Decreased activity tolerance;Decreased cognition;Decreased knowledge of use of DME       PT Treatment Interventions Functional mobility training;Balance training;Patient/family education;Gait training;Therapeutic activities;Stair training;Therapeutic exercise;DME instruction    PT Goals (Current goals can be found in the Care Plan section)  Acute Rehab PT Goals Patient Stated Goal: to resume general daily activity at home with use of SPC. PT Goal Formulation: With patient/family Time For Goal Achievement: 10/04/18 Potential to Achieve Goals: Good    Frequency Min 3X/week   Barriers to discharge        Co-evaluation               AM-PAC PT "6 Clicks" Daily Activity  Outcome Measure Difficulty turning over in bed (including adjusting bedclothes, sheets and blankets)?: None Difficulty moving from lying on back to sitting on the side of the bed? : None Difficulty sitting down on and standing up from a chair with arms (e.g., wheelchair, bedside commode, etc,.)?: A Little Help needed moving to and from a bed to chair (including a wheelchair)?: A Little Help needed walking in hospital room?: A  Little Help needed climbing 3-5 steps with a railing? : A Little 6 Click Score: 20    End of Session Equipment Utilized During Treatment: Gait belt;Oxygen Activity Tolerance: Patient tolerated treatment well Patient left: in bed;with call bell/phone within reach;with bed alarm set;with family/visitor present Nurse Communication: Mobility status PT Visit Diagnosis: Unsteadiness on feet (R26.81);Muscle weakness (generalized) (M62.81)    Time: 6270-3500 PT Time Calculation (min) (ACUTE ONLY): 18 min   Charges:   PT Evaluation $PT Eval Moderate Complexity: [place]  Pager [phone] Office [place] 09/20/2018, 5:04 PM

## 2018-09-20 NOTE — ED Provider Notes (Signed)
[facility] EMERGENCY DEPARTMENT Provider Note   CSN: [id number] Arrival date & time: 09/20/18  2778     History   Chief Complaint Chief Complaint  Patient presents with  . Transient Ischemic Attack    HPI [name] is a 84 y.o. female.  The history is provided by the patient, a relative and medical records. No language interpreter was used.   [name] is a 84 y.o. female who presents to the Emergency Department complaining of TIA. Presents to the emergency department via EMS for evaluation of possible TIA/stroke. Level V caveat due to confusion. She was last seen normal at 10 PM last night. This morning she experienced difficulty speaking and she got her son who notified the neighbor and called 911. On EMS arrival she was noted to have expressive aphasia as well as left sided facial droop. Overall her symptoms have completely resolved. She states that she was feeling a little bit confused earlier but feels well currently. She has recently had to hospital admissions for CHF exacerbation. She has CKD. She takes aspirin and Plavix. She denies any fevers, chest pain, black or body stools. She does have a history of prior TIA 2 to 3 years ago. Past Medical History:  Diagnosis Date  . ACS (acute coronary syndrome) ([place]) 08/2018  . Anemia    family unsure of this history  . Arthritis   . CHF (congestive heart failure) ([place])   . Chronic ulcer of calf (HCC)    Left  . Constipation   . Diabetes mellitus without complication ([place])   . Dyslipidemia   . Dyspnea   . GERD (gastroesophageal reflux disease)   . Hypertension   . Peripheral vascular disease [facility])     Patient Active Problem List   Diagnosis Date Noted  . CHF (congestive heart failure) ([place]) 09/16/2018  . Advanced care planning/counseling discussion   . Goals of care, counseling/discussion   . Acute on chronic systolic heart failure ([place]) 09/15/2018  . Acute congestive heart failure  ([place])   . Palliative care by specialist   . DNR (do not resuscitate)   . Weakness generalized   . Acute HFrEF (heart failure with reduced ejection fraction) ([place]) 09/10/2018  . CKD (chronic kidney disease) stage 4, GFR 15-29 ml/min (HCC) 09/10/2018  . Acute coronary syndrome ([place]) 09/09/2018  . Anemia in chronic kidney disease 08/25/2018  . Gangrene of toe of left foot ([place]) 12/23/2017  . PAD (peripheral artery disease) ([place]) 09/21/2017  . Atherosclerosis of artery of extremity with ulceration ([place]) 09/08/2017  . Atherosclerosis of native arteries of the extremities with ulceration ([place]) 06/25/2017  . Stroke ([place]) 12/31/2013  . TIA (transient ischemic attack) 12/31/2013  . Acute ischemic stroke ([place]) 12/31/2013  . ARF (acute renal failure) ([place]) 12/31/2013  . Hypertension   . Diabetes mellitus without complication ([place])   . Dyslipidemia   . Constipation   . Anemia     Past Surgical History:  Procedure Laterality Date  . ABDOMINAL AORTOGRAM W/LOWER EXTREMITY Left 12/27/2017   Procedure: ABDOMINAL AORTOGRAM W/LOWER EXTREMITY;  Surgeon: Algernon Huxley, MD;  Location: [facility] CV [facility];  Service: Cardiovascular;  Laterality: Left;  . ABDOMINAL HYSTERECTOMY    . AMPUTATION TOE Left 12/23/2017   Procedure: AMPUTATION TOE LEFT GREAT TOE;  Surgeon: Sharlotte Alamo, DPM;  Location: [facility] ORS;  Service: Podiatry;  Laterality: Left;  . ENDARTERECTOMY FEMORAL Left 09/08/2017   Procedure: ENDARTERECTOMY FEMORAL WITH CORMATRIX PATCH;  Surgeon: Leotis Pain  S, MD;  Location: [facility] ORS;  Service: Vascular;  Laterality: Left;  . EYE SURGERY Bilateral    Cataract Extraction with IOL  . LOWER EXTREMITY ANGIOGRAPHY Left 07/22/2017   Procedure: Lower Extremity Angiography;  Surgeon: Algernon Huxley, MD;  Location: [facility];  Service: Cardiovascular;  Laterality: Left;  . LOWER EXTREMITY ANGIOGRAPHY Left 08/16/2017   Procedure: Lower Extremity Angiography;  Surgeon: Algernon Huxley, MD;  Location: [facility];  Service: Cardiovascular;  Laterality: Left;  Marland Kitchen MULTIPLE TOOTH EXTRACTIONS       OB History   None      Home Medications    Prior to Admission medications   Medication Sig Start Date End Date Taking? Authorizing Provider  ACCU-CHEK FASTCLIX LANCETS MISC TEST TID 12/31/17   [provider]  ACCU-CHEK GUIDE test strip  12/31/17   [provider]  acetaminophen (TYLENOL) 325 MG tablet Take 2 tablets (650 mg total) by mouth every 6 (six) hours as needed for mild pain or headache (or Fever >/= 101). 09/13/18   Roxan Hockey, MD  aspirin EC 81 MG tablet Take 1 tablet (81 mg total) by mouth daily. With food 09/13/18   Roxan Hockey, MD  atorvastatin (LIPITOR) 10 MG tablet Take 1 tablet (10 mg total) by mouth daily. 09/13/18   Roxan Hockey, MD  benzonatate (TESSALON) 100 MG capsule Take 100 mg by mouth 3 (three) times daily. 09/06/18   [provider]  Blood Glucose Monitoring Suppl (ACCU-CHEK GUIDE) w/Device KIT See admin instructions. 12/28/17   [provider]  Calcium Carbonate-Vitamin D (CALCIUM 500 + D) 500-125 MG-UNIT TABS Take 1 tablet by mouth daily.     [provider]  Calcium-Magnesium-Zinc (CAL-MAG-ZINC PO) Take 1 tablet by mouth daily.    [provider]  carvedilol (COREG) 12.5 MG tablet Take 1 tablet (12.5 mg total) by mouth 2 (two) times daily with a meal. 09/13/18   Emokpae, Courage, MD  Cholecalciferol (VITAMIN D3 GUMMIES ADULT PO) Take 1 tablet by mouth daily.    [provider]  citalopram (CELEXA) 10 MG tablet Take 10 mg by mouth daily.  05/12/18   [provider]  clopidogrel (PLAVIX) 75 MG tablet Take 1 tablet (75 mg total) by mouth daily with breakfast. 09/13/18   Roxan Hockey, MD  furosemide (LASIX) 40 MG tablet Take 1 tablet (40 mg total) by mouth daily. 09/17/18 11/16/18  Henreitta Leber, MD  glipiZIDE (GLUCOTROL) 5 MG tablet Take 0.5 tablets (2.5 mg total) by mouth 2 (two)  times daily before a meal. Always take with food 09/13/18   Roxan Hockey, MD  pantoprazole (PROTONIX) 20 MG tablet Take 1 tablet (20 mg total) by mouth daily. 07/13/14   Billy Fischer, MD  polyethylene glycol (MIRALAX / [name]) packet Take 17 g by mouth daily as needed (for constipation.).     [provider]    Family History Family History  Problem Relation Age of Onset  . Hypertension Father     Social History Social History   Tobacco Use  . Smoking status: Never Smoker  . Smokeless tobacco: Never Used  Substance Use Topics  . Alcohol use: No  . Drug use: No     Allergies   Patient has no known allergies.   Review of Systems Review of Systems  All other systems reviewed and are negative.    Physical Exam Updated Vital Signs BP 122/71   Pulse 64   Temp Marland Kitchen)  95.2 F (35.1 C) (Rectal)   Resp (!) 21   SpO2 96%   Physical Exam  Constitutional: She is oriented to person, place, and time. She appears well-developed.  Thin  HENT:  Head: Normocephalic and atraumatic.  Cardiovascular: Normal rate and regular rhythm.  No murmur heard. Pulmonary/Chest: Effort normal. No respiratory distress.  Crackles in the left lung base  Abdominal: Soft. There is no tenderness. There is no rebound and no guarding.  Musculoskeletal: She exhibits no edema or tenderness.  Neurological: She is alert and oriented to person, place, and time.  No facial asymmetry, 5/5 strength in all four extremities, sensation to light touch intact in all four extremities, no pronator drift, visual fields grossly intact.    Skin: Skin is warm and dry.  Psychiatric: She has a normal mood and affect. Her behavior is normal.  Nursing note and vitals reviewed.    ED Treatments / Results  Labs (all labs ordered are listed, but only abnormal results are displayed) Labs Reviewed  CBC - Abnormal; Notable for the following components:      Result Value   RBC 2.81 (*)    Hemoglobin 7.0 (*)     HCT 22.8 (*)    MCH 24.9 (*)    RDW 17.9 (*)    nRBC 0.4 (*)    All other components within normal limits  I-STAT CHEM 8, ED - Abnormal; Notable for the following components:   BUN 36 (*)    Creatinine, Ser 3.00 (*)    Glucose, Bld 141 (*)    Calcium, Ion 1.07 (*)    Hemoglobin 7.5 (*)    HCT 22.0 (*)    All other components within normal limits  DIFFERENTIAL  ETHANOL  PROTIME-INR  APTT  COMPREHENSIVE METABOLIC PANEL  RAPID URINE DRUG SCREEN, HOSP PERFORMED  URINALYSIS, ROUTINE W REFLEX MICROSCOPIC  I-STAT TROPONIN, ED  POC OCCULT BLOOD, ED    EKG EKG Interpretation  Date/Time:  Tuesday September 20 2018 06:22:05 EDT Ventricular Rate:  70 PR Interval:    QRS Duration: 96 QT Interval:  449 QTC Calculation: 485 R Axis:   82 Text Interpretation:  Sinus rhythm Probable anterior infarct, age indeterminate Lateral leads are also involved No significant change since last tracing Confirmed by Pryor Curia [phone]) on 09/20/2018 6:24:31 AM   Radiology No results found.  Procedures Procedures (including critical care time) CRITICAL CARE Performed by: Quintella Reichert   Total critical care time: 35 minutes  Critical care time was exclusive of separately billable procedures and treating other patients.  Critical care was necessary to treat or prevent imminent or life-threatening deterioration.  Critical care was time spent personally by me on the following activities: development of treatment plan with patient and/or surrogate as well as nursing, discussions with consultants, evaluation of patient's response to treatment, examination of patient, obtaining history from patient or surrogate, ordering and performing treatments and interventions, ordering and review of laboratory studies, ordering and review of radiographic studies, pulse oximetry and re-evaluation of patient's condition.  Medications Ordered in ED Medications - No data to display   Initial Impression /  Assessment and Plan / ED Course  I have reviewed the triage vital signs and the nursing notes.  Pertinent labs & imaging results that were available during my care of the patient were reviewed by me and considered in my medical decision making (see chart for details).     Patient with history of CHF here for evaluation of change in mental  status with facial droop and aphasia. She is back at her baseline on evaluation in the emergency department. Labs demonstrate progressive anemia, concern that anemia did trigger her neurologic deficits. Plan to transfuse. Medicine consulted for admission for further treatment. Patient and family updated of findings of studies and recommendation for admission and they are in agreement with plan.  Final Clinical Impressions(s) / ED Diagnoses   Final diagnoses:  None    ED Discharge Orders    None       Quintella Reichert, MD 09/20/18 1545

## 2018-09-20 NOTE — ED Notes (Signed)
Patient transported to CT 

## 2018-09-20 NOTE — Consult Note (Signed)
[facility] Consult: 12:55 PM 09/20/2018  LOS: 0 days    Referring Provider: Dr Olevia Bowens.    Primary Care Physician:  Lucianne Lei, MD Primary Gastroenterologist: Althia Forts.    Reason for Consultation: Anemia.  FOBT negative.    HPI: [name] is a 84 y.o. female.  Hx CAD.  Acute coronary syndrome.  Combined diastolic and systolic heart failure.  DM 2.  HLD.  Peripheral vascular disease. s/p 08/2017 left LE endarterectomy and angioplasty.  S/p left common and external iliac artery stents.  Chronic Plavix 11/2017 left Hallux amputation for osteomyelitis.  Normocytic anemia 08/26/2018 abdominal ultrasound, to evaluate for possible splenomegaly and left upper quadrant pain, showed suggestion of left pleural effusion but no splenomegaly. Admitted 10/17 -09/17/2018 2 [facility] with heart failure.  EMS transported the patient to [facility] today with acute slurred speech, expressive aphasia, left facial droop.  Speech became altered while she was on the phone talking to her son.  The symptoms resolved spontaneously. Labs initially showed Hgb 7.  MCV 81.   Anemia dates back to 08/2017 with Hgb 8.7, MCV 77.  Hgb 7.1 in 11/2017.  Transfused 1 U PRBC then.  During recent admission Hgb of 7.7 on 10/13, was  Not transfused. PT/INR normal. Stool FOBT negative today.   WBCs 4.6.  Platelets 279.  Normal coags.  Normal alcohol level.  Slightly worse advanced kidney disease with normal electrolytes.  Glucose 142. LFTs with mild elevation AST at 59 but otherwise normal. CXR revealed stable cardiomegaly.  Stable bilateral pulmonary edema versus atelectasis. Head CT without acute intracranial pathology.  Anemia has been evaluated and followed by hematologist, Dr. Alen Blew.  His assessment is that of multifactorial anemia.  There is no  evidence of plasma cell disorder, iron and ferritin levels and serum protein electrophoresis all normal.  As of office note visit of 08/25/2018 she was to receive Aranesp 300 mcg every 3 weeks with goal of keeping Hgb above 10.   He did not suggest endoscopic/colonoscopic evaluation this had never been discussed with the patient or family until she arrived in the ED today. Patient has not had any bloody or black stools.  No nausea, no emesis.  Appetite generally poor but it has been like that chronically.  Chronic constipation, bowels move once or twice a week.  She uses MiraLAX prn.  Occasional left flank and UQ discomfort, none in recent weeks.        Past Medical History:  Diagnosis Date  . ACS (acute coronary syndrome) ([place]) 08/2018  . Anemia    family unsure of this history  . Arthritis   . CHF (congestive heart failure) ([place])   . Chronic ulcer of calf (HCC)    Left  . Constipation   . Diabetes mellitus without complication ([place])   . Dyslipidemia   . Dyspnea   . GERD (gastroesophageal reflux disease)   . Hypertension   . Peripheral vascular disease [facility])     Past Surgical History:  Procedure Laterality Date  . ABDOMINAL AORTOGRAM W/LOWER EXTREMITY Left 12/27/2017  Procedure: ABDOMINAL AORTOGRAM W/LOWER EXTREMITY;  Surgeon: Algernon Huxley, MD;  Location: [facility];  Service: Cardiovascular;  Laterality: Left;  . ABDOMINAL HYSTERECTOMY    . AMPUTATION TOE Left 12/23/2017   Procedure: AMPUTATION TOE LEFT GREAT TOE;  Surgeon: Sharlotte Alamo, DPM;  Location: [facility] ORS;  Service: Podiatry;  Laterality: Left;  . ENDARTERECTOMY FEMORAL Left 09/08/2017   Procedure: ENDARTERECTOMY FEMORAL WITH CORMATRIX PATCH;  Surgeon: Algernon Huxley, MD;  Location: [facility] ORS;  Service: Vascular;  Laterality: Left;  . EYE SURGERY Bilateral    Cataract Extraction with IOL  . LOWER EXTREMITY ANGIOGRAPHY Left 07/22/2017   Procedure: Lower Extremity Angiography;  Surgeon: Algernon Huxley, MD;  Location: [facility];  Service: Cardiovascular;  Laterality: Left;  . LOWER EXTREMITY ANGIOGRAPHY Left 08/16/2017   Procedure: Lower Extremity Angiography;  Surgeon: Algernon Huxley, MD;  Location: [facility];  Service: Cardiovascular;  Laterality: Left;  Marland Kitchen MULTIPLE TOOTH EXTRACTIONS      Prior to Admission medications   Medication Sig Start Date End Date Taking? Authorizing Provider  acetaminophen (TYLENOL) 325 MG tablet Take 2 tablets (650 mg total) by mouth every 6 (six) hours as needed for mild pain or headache (or Fever >/= 101). 09/13/18  Yes Roxan Hockey, MD  aspirin EC 81 MG tablet Take 1 tablet (81 mg total) by mouth daily. With food Patient taking differently: Take 81 mg by mouth daily.  09/13/18  Yes Emokpae, Courage, MD  Calcium Carbonate-Vitamin D (CALCIUM 500 + D) 500-125 MG-UNIT TABS Take 1 tablet by mouth daily.    Yes [provider]  carvedilol (COREG) 12.5 MG tablet Take 1 tablet (12.5 mg total) by mouth 2 (two) times daily with a meal. 09/13/18  Yes Emokpae, Courage, MD  clopidogrel (PLAVIX) 75 MG tablet Take 1 tablet (75 mg total) by mouth daily with breakfast. 09/13/18  Yes Emokpae, Courage, MD  furosemide (LASIX) 40 MG tablet Take 1 tablet (40 mg total) by mouth daily. 09/17/18 11/16/18 Yes Sainani, Belia Heman, MD  glipiZIDE (GLUCOTROL) 5 MG tablet Take 0.5 tablets (2.5 mg total) by mouth 2 (two) times daily before a meal. Always take with food 09/13/18  Yes Emokpae, Courage, MD  pantoprazole (PROTONIX) 20 MG tablet Take 1 tablet (20 mg total) by mouth daily. 07/13/14  Yes Kindl, Nelda Severe, MD  polyethylene glycol (MIRALAX / GLYCOLAX) packet Take 17 g by mouth daily as needed (for constipation.).    Yes [provider]  ACCU-CHEK FASTCLIX LANCETS MISC TEST TID 12/31/17   [provider]  ACCU-CHEK GUIDE test strip  12/31/17   [provider]  atorvastatin (LIPITOR) 10 MG tablet Take 1 tablet (10 mg total) by mouth daily. Patient not taking:  Reported on 09/20/2018 09/13/18   Roxan Hockey, MD  Blood Glucose Monitoring Suppl (ACCU-CHEK GUIDE) w/Device KIT See admin instructions. 12/28/17   [provider]    Scheduled Meds: .  stroke: mapping our early stages of recovery book   Does not apply Once  . sodium chloride   Intravenous Once  . furosemide  20 mg Intravenous Once  . [START ON 09/21/2018] furosemide  40 mg Intravenous Daily   Infusions:  PRN Meds: acetaminophen **OR** acetaminophen (TYLENOL) oral liquid 160 mg/5 mL **OR** acetaminophen   Allergies as of 09/20/2018  . (No Known Allergies)    Family History  Problem Relation Age of Onset  . Hypertension Father     Social History   Socioeconomic  History  . Marital status: Legally Separated    Spouse name: Not on file  . Number of children: Not on file  . Years of education: Not on file  . Highest education level: Not on file  Occupational History  . Not on file  Social Needs  . Financial resource strain: Not on file  . Food insecurity:    Worry: Not on file    Inability: Not on file  . Transportation needs:    Medical: Not on file    Non-medical: Not on file  Tobacco Use  . Smoking status: Never Smoker  . Smokeless tobacco: Never Used  Substance and Sexual Activity  . Alcohol use: No  . Drug use: No  . Sexual activity: Not Currently  Lifestyle  . Physical activity:    Days per week: Not on file    Minutes per session: Not on file  . Stress: Not on file  Relationships  . Social connections:    Talks on phone: Not on file    Gets together: Not on file    Attends religious service: Not on file    Active member of club or organization: Not on file    Attends meetings of clubs or organizations: Not on file    Relationship status: Not on file  . Intimate partner violence:    Fear of current or ex partner: Not on file    Emotionally abused: Not on file    Physically abused: Not on file    Forced sexual activity: Not on file    Other Topics Concern  . Not on file  Social History Narrative  . Not on file    REVIEW OF SYSTEMS: Constitutional: General weakness.  No fatigue. ENT:  No nose bleeds Pulm: No trouble breathing, no cough. CV:  No palpitations, no LE edema.  GU:  No hematuria, no frequency GI:  Per HPI Heme: Denies unusual or excessive bleeding or bruising. Transfusions:  Per HPI Neuro:  No headaches, no peripheral tingling or numbness Derm:  No itching, no rash or sores.  Endocrine:  No sweats or chills.  No polyuria or dysuria Immunization:  Not queried.   Travel:  None beyond local counties in last few months.    PHYSICAL EXAM: Vital signs in last 24 hours: Vitals:   09/20/18 0945 09/20/18 1008  BP: 130/78   Pulse: 71   Resp: (!) 26   Temp:  (!) 93.9 F (34.4 C)  SpO2: 98%    Wt Readings from Last 3 Encounters:  09/17/18 55.4 kg  09/13/18 55.8 kg  08/25/18 58.9 kg    General: Pleasant, alert, elderly AAF.  Laying comfortably in the stretcher in the ED. Head: No facial asymmetry or swelling.  No signs of head trauma. Eyes: No scleral icterus.  Conjunctiva pale.  EOMI. Ears: Slightly HOH. Nose: No congestion or discharge. Mouth: Oropharynx moist, pink, clear. Neck: No JVD, no masses, no thyromegaly. Lungs: Clear bilaterally. Heart: RRR.  No MRG.  S1, S2 present. Abdomen: Soft.  Not tender.  Active bowel sounds.  Not distended.  Liver edge smooth, palpable, nontender about 1 to 2 cm below costal margin.  Old, well-healed lower midline surgical scar. Rectal: Deferred Musc/Skeltl: No joint redness, swelling or significant deformity. Extremities: No edema.  Left hallux amputation site well-healed. Neurologic: Alert.  Appropriate.  Recalls names of some of her medications.  Not confused.  Moves all 4 limbs.  No tremor or gross deficits/weakness. Skin: Somewhat dry but no  suspicious lesions, open sores or rashes. Tattoos: None observed Nodes: No cervical adenopathy. Psych:  Cooperative, pleasant, calm.  Intake/Output from previous day: No intake/output data recorded. Intake/Output this shift: No intake/output data recorded.  LAB RESULTS: Recent Labs    09/20/18 0648 09/20/18 0700  WBC 4.6  --   HGB 7.0* 7.5*  HCT 22.8* 22.0*  PLT 279  --    BMET Lab Results  Component Value Date   NA 138 09/20/2018   NA 135 09/20/2018   NA 143 09/17/2018   K 4.0 09/20/2018   K 3.9 09/20/2018   K 3.7 09/17/2018   CL 103 09/20/2018   CL 101 09/20/2018   CL 106 09/17/2018   CO2 23 09/20/2018   CO2 27 09/17/2018   CO2 26 09/16/2018   GLUCOSE 141 (H) 09/20/2018   GLUCOSE 142 (H) 09/20/2018   GLUCOSE 71 09/17/2018   BUN 36 (H) 09/20/2018   BUN 38 (H) 09/20/2018   BUN 40 (H) 09/17/2018   CREATININE 3.00 (H) 09/20/2018   CREATININE 2.69 (H) 09/20/2018   CREATININE 2.61 (H) 09/17/2018   CALCIUM 8.9 09/20/2018   CALCIUM 8.3 (L) 09/17/2018   CALCIUM 8.5 (L) 09/16/2018   LFT Recent Labs    09/20/18 0648  PROT 7.7  ALBUMIN 2.8*  AST 59*  ALT 31  ALKPHOS 80  BILITOT 0.4   PT/INR Lab Results  Component Value Date   INR 1.13 09/20/2018   INR 1.05 09/02/2017   Hepatitis Panel No results for input(s): HEPBSAG, HCVAB, HEPAIGM, HEPBIGM in the last 72 hours. C-Diff No components found for: CDIFF Lipase  No results found for: LIPASE  Drugs of Abuse  No results found for: LABOPIA, COCAINSCRNUR, LABBENZ, AMPHETMU, THCU, LABBARB   RADIOLOGY STUDIES: Dg Chest 2 View  Result Date: 09/20/2018 CLINICAL DATA:  Weakness. EXAM: CHEST - 2 VIEW COMPARISON:  Radiographs of September 16, 2018. FINDINGS: Stable cardiomegaly. Atherosclerosis of thoracic aorta is noted. No pneumothorax is noted. Stable bibasilar atelectasis or edema is noted with associated pleural effusions. Bony thorax is unremarkable. IMPRESSION: Stable bilateral pulmonary edema or atelectasis is noted with associated pleural effusions. Aortic Atherosclerosis (ICD10-I70.0). Electronically Signed    By: Marijo Conception, M.D.   On: 09/20/2018 08:42   Ct Head Wo Contrast  Result Date: 09/20/2018 CLINICAL DATA:  Slurred speech, transient EXAM: CT HEAD WITHOUT CONTRAST TECHNIQUE: Contiguous axial images were obtained from the base of the skull through the vertex without intravenous contrast. COMPARISON:  Head CT December 31, 2013 and brain MRI December 31, 2013 FINDINGS: Brain: Moderate diffuse atrophy is stable. There is no intracranial mass, hemorrhage, extra-axial fluid collection, or midline shift. There is small vessel disease throughout the centra semiovale bilaterally. There is evidence of a prior small infarct in the lateral left thalamus. There is small vessel disease throughout portions of the internal and external capsule regions bilaterally. Small vessel disease in the mid brain region is present, somewhat better seen on prior studies. No acute infarct is demonstrated currently. Vascular: There are no appreciable hyperdense vessels. There is calcification in each distal vertebral artery and carotid siphon region. Skull: The bony calvarium appears intact. Sinuses/Orbits: There is a retention cyst in the medial left maxillary antrum. There is mucosal thickening in several ethmoid air cells. Orbits appear symmetric bilaterally. Patient has had cataract removals bilaterally. Other: Mastoid air cells are clear. IMPRESSION: Atrophy with extensive supratentorial small vessel disease. Midbrain small vessel disease is present, better seen on prior studies. No acute infarct  appreciable. No mass or hemorrhage. There are multiple foci of arterial vascular calcification. There are foci of paranasal sinus disease. Electronically Signed   By: Lowella Grip III M.D.   On: 09/20/2018 08:19      IMPRESSION:   *    Chronic anemia.  Previously microcytic, now normocytic. Labs in 04/2018 did not reveal iron or B12 deficiency Hematologist started treatment with Aranesp as of late 07/2018.  I see that she  received this on 08/31/2018.  *    Stage 4 - 5 CKD.   Suspect this is the biggest culprit in her anemia.  *    Peripheral vascular disease.  Status post surgical intervention and stenting.  On chronic Plavix.   PLAN:     *   Given her age and the fact that she is FOBT negative not clear it is worth the risk of performing colonoscopy or upper endoscopy.  Her family (son) was/is curious about the procedures and whether or not they are indicated.  They are not pushing for endoscopic procedures to be performed. Dr. Rush Landmark will see pt today.     Azucena Freed  09/20/2018, 12:55 PM Phone [phone]

## 2018-09-20 NOTE — ED Notes (Signed)
Warm blankets applied. Dr. Ralene Bathe aware

## 2018-09-20 NOTE — Patient Outreach (Signed)
[facility]) Care Management  09/20/2018  [name] Oct 18, 1926 [id number]   Noted that member presented back to the ED today with TIA, plan to be admitted for observation.  Hospital liaisons notified, will follow up once discharged.  Valente David, [place], MSN [place] [phone]

## 2018-09-20 NOTE — ED Notes (Signed)
Bair Hugger applied

## 2018-09-20 NOTE — ED Notes (Signed)
Admitting MD paged regarding pt temp. Will cont to monitor.

## 2018-09-20 NOTE — Progress Notes (Addendum)
Patient stable and settled in bed with granddaughter at bedside. AO x 4, no pain. Placed on cardiac monitor, vitals taken.

## 2018-09-21 ENCOUNTER — Ambulatory Visit: Payer: Medicaid Other | Admitting: Internal Medicine

## 2018-09-21 ENCOUNTER — Ambulatory Visit: Payer: Self-pay | Admitting: Pharmacist

## 2018-09-21 ENCOUNTER — Other Ambulatory Visit: Payer: Self-pay | Admitting: Pharmacist

## 2018-09-21 ENCOUNTER — Ambulatory Visit (HOSPITAL_BASED_OUTPATIENT_CLINIC_OR_DEPARTMENT_OTHER): Payer: Medicare Other

## 2018-09-21 DIAGNOSIS — I340 Nonrheumatic mitral (valve) insufficiency: Secondary | ICD-10-CM | POA: Diagnosis not present

## 2018-09-21 DIAGNOSIS — N184 Chronic kidney disease, stage 4 (severe): Secondary | ICD-10-CM

## 2018-09-21 DIAGNOSIS — E785 Hyperlipidemia, unspecified: Secondary | ICD-10-CM | POA: Diagnosis not present

## 2018-09-21 DIAGNOSIS — I5043 Acute on chronic combined systolic (congestive) and diastolic (congestive) heart failure: Secondary | ICD-10-CM

## 2018-09-21 DIAGNOSIS — I10 Essential (primary) hypertension: Secondary | ICD-10-CM | POA: Diagnosis not present

## 2018-09-21 DIAGNOSIS — G459 Transient cerebral ischemic attack, unspecified: Secondary | ICD-10-CM | POA: Diagnosis not present

## 2018-09-21 LAB — LIPID PANEL
CHOL/HDL RATIO: 2.8 ratio
Cholesterol: 121 mg/dL (ref 0–200)
HDL: 43 mg/dL (ref >40)
LDL Cholesterol: 60 mg/dL (ref 0–99)
Triglycerides: 91 mg/dL (ref <150)
VLDL: 18 mg/dL (ref 0–40)

## 2018-09-21 LAB — TYPE AND SCREEN
ABO/RH(D): B POS
ANTIBODY SCREEN: NEGATIVE
Unit division: 0

## 2018-09-21 LAB — CBC
HCT: 27.7 % — ABNORMAL LOW (ref 36.0–46.0)
Hemoglobin: 9.1 g/dL — ABNORMAL LOW (ref 12.0–15.0)
MCH: 25.5 pg — ABNORMAL LOW (ref 26.0–34.0)
MCHC: 32.9 g/dL (ref 30.0–36.0)
MCV: 77.6 fL — ABNORMAL LOW (ref 80.0–100.0)
NRBC: 0.4 % — AB (ref 0.0–0.2)
PLATELETS: 275 10*3/uL (ref 150–400)
RBC: 3.57 MIL/uL — ABNORMAL LOW (ref 3.87–5.11)
RDW: 17.1 % — AB (ref 11.5–15.5)
WBC: 5.1 10*3/uL (ref 4.0–10.5)

## 2018-09-21 LAB — COMPREHENSIVE METABOLIC PANEL
ALBUMIN: 2.8 g/dL — AB (ref 3.5–5.0)
ALT: 30 U/L (ref 0–44)
AST: 47 U/L — AB (ref 15–41)
Alkaline Phosphatase: 75 U/L (ref 38–126)
Anion gap: 11 (ref 5–15)
BUN: 34 mg/dL — AB (ref 8–23)
CHLORIDE: 106 mmol/L (ref 98–111)
CO2: 23 mmol/L (ref 22–32)
Calcium: 8.9 mg/dL (ref 8.9–10.3)
Creatinine, Ser: 2.5 mg/dL — ABNORMAL HIGH (ref 0.44–1.00)
GFR calc Af Amer: 18 mL/min — ABNORMAL LOW (ref >60)
GFR calc non Af Amer: 16 mL/min — ABNORMAL LOW (ref >60)
GLUCOSE: 91 mg/dL (ref 70–99)
POTASSIUM: 3.8 mmol/L (ref 3.5–5.1)
SODIUM: 140 mmol/L (ref 135–145)
Total Bilirubin: 0.8 mg/dL (ref 0.3–1.2)
Total Protein: 7.9 g/dL (ref 6.5–8.1)

## 2018-09-21 LAB — GLUCOSE, CAPILLARY
GLUCOSE-CAPILLARY: 176 mg/dL — AB (ref 70–99)
Glucose-Capillary: 108 mg/dL — ABNORMAL HIGH (ref 70–99)
Glucose-Capillary: 133 mg/dL — ABNORMAL HIGH (ref 70–99)

## 2018-09-21 LAB — HEMOGLOBIN A1C
Hgb A1c MFr Bld: 5.8 % — ABNORMAL HIGH (ref 4.8–5.6)
MEAN PLASMA GLUCOSE: 119.76 mg/dL

## 2018-09-21 LAB — BPAM RBC
BLOOD PRODUCT EXPIRATION DATE: 201911162359
ISSUE DATE / TIME: 201910221530
Unit Type and Rh: 7300

## 2018-09-21 LAB — ECHOCARDIOGRAM LIMITED: WEIGHTICAEL: 1978.85 [oz_av]

## 2018-09-21 LAB — TROPONIN I
TROPONIN I: 0.04 ng/mL — AB (ref <0.03)
TROPONIN I: 0.03 ng/mL — AB (ref <0.03)

## 2018-09-21 LAB — HEMOGLOBIN AND HEMATOCRIT, BLOOD
HCT: 28.4 % — ABNORMAL LOW (ref 36.0–46.0)
Hemoglobin: 8.9 g/dL — ABNORMAL LOW (ref 12.0–15.0)

## 2018-09-21 MED ORDER — FAMOTIDINE IN NACL 20-0.9 MG/50ML-% IV SOLN
20.0000 mg | Freq: Two times a day (BID) | INTRAVENOUS | Status: DC
  Administered 2018-09-21: 20 mg via INTRAVENOUS
  Filled 2018-09-21: qty 50

## 2018-09-21 MED ORDER — FAMOTIDINE 20 MG PO TABS
20.0000 mg | ORAL_TABLET | Freq: Two times a day (BID) | ORAL | Status: DC
  Administered 2018-09-21: 20 mg via ORAL
  Filled 2018-09-21: qty 1

## 2018-09-21 MED ORDER — FAMOTIDINE 20 MG PO TABS
20.0000 mg | ORAL_TABLET | Freq: Every day | ORAL | Status: DC
  Administered 2018-09-22: 20 mg via ORAL
  Filled 2018-09-21 (×2): qty 1

## 2018-09-21 NOTE — Progress Notes (Signed)
  Echocardiogram 2D Echocardiogram has been performed.  Bobbye Charleston 09/21/2018, 11:46 AM

## 2018-09-21 NOTE — Progress Notes (Signed)
PROGRESS NOTE    [name]  EHU:[id number] DOB: 1926/06/24 DOA: 09/20/2018 PCP: Lucianne Lei, MD    Brief Narrative:  [name] is a 84 y.o. female with medical history significant of CAD, history of acute coronary syndrome, chronic combined systolic and diastolic heart failure, history of left calf ulcer, constipation, type 2 diabetes, hyperlipidemia, dyspnea, GERD, hypertension, peripheral vascular disease who was recently admitted from 09/15/2018 to 09/17/2018 at [facility] due to hypoxia secondary to congestive heart failure and is being brought to the emergency department now after having sudden onset of slurred speech, expressive aphasia and left-sided facial droop while she was talking to her son.  Assessment & Plan:   Principal Problem:   TIA (transient ischemic attack) Active Problems:   Hypertension   Dyslipidemia   Anemia   PAD (peripheral artery disease) (HCC)   CKD (chronic kidney disease) stage 4, GFR 15-29 ml/min (HCC)   Acute on chronic combined systolic and diastolic congestive heart failure (HCC)   Hypothermia  Generalized weakness She was initially admitted for evaluation of TIA, stroke work-up has been negative so far with negative MRI of the brain.  Therapy evaluations recommending home PT.    Mild acute on chronic combined systolic and diastolic heart failure Started her on IV Lasix, diuresing appropriately.  Continue with daily weights and strict intake and output.  Check renal para meters while on IV Lasix.  Given her advanced age renal disease and cardiac disease her prognosis Is poor and cardiology did not recommend any revascularization procedures at this time.  Patient her to 2 L of nasal cannula oxygen monitor overnight and if stable probably discharge her in the morning.   Peripheral artery disease Continue with aspirin and Plavix   Stage IV CKD GFR about 15-29 Renal para meters while on IV Lasix.    Anemia of chronic disease probably  secondary to chronic renal insufficiency GI consulted did not recommended any endoscopy procedures at this time.  Stool for occult blood is negative at this time.   Hyperlipidemia not on any medical therapy at this time.   Hypertension well controlled resume home blood pressure medications on discharge   DVT prophylaxis: SCDs Code Status: DNR Family Communication: Discussed with son at bed  disposition Plan: We will discharge on home home with home physical therapy possibly tomorrow when we can wean her oxygen down to 2 L   Consultants:   Gastroenterology  Procedures: Echocardiogram  Antimicrobials: None   Subjective: Chest pain, shortness of breath, cough, nausea or vomiting no headache or dizziness.  Objective: Vitals:   09/21/18 0327 09/21/18 0731 09/21/18 1224 09/21/18 1631  BP: (!) 148/83 (!) 141/71 101/65 109/60  Pulse: 77 77 74 66  Resp: 20 20 20 20   Temp: (!) 97.2 F (36.2 C) 98.4 F (36.9 C) 98.1 F (36.7 C) 97.6 F (36.4 C)  TempSrc: Oral Oral Axillary Oral  SpO2: (!) 88%  98% 97%  Weight: 56.1 kg       Intake/Output Summary (Last 24 hours) at 09/21/2018 1831 Last data filed at 09/21/2018 1800 Gross per 24 hour  Intake 945 ml  Output 150 ml  Net 795 ml   Filed Weights   09/20/18 2015 09/21/18 0327  Weight: 56.3 kg 56.1 kg    Examination:  General exam: Appears calm and comfortable 3 L of nasal cannula oxygen Respiratory system: Diminished at bases, respiratory effort normal. Cardiovascular system: S1 & S2 heard, RRR. No JVD, . No pedal edema. Gastrointestinal system:  Abdomen is nondistended, soft and nontender.  Normal bowel sounds heard. Central nervous system: Alert and oriented. No focal neurological deficits. Extremities: Symmetric 5 x 5 power. Skin: No rashes, lesions or ulcers Psychiatry: . Mood & affect appropriate.     Data Reviewed: I have personally reviewed following labs and imaging studies  CBC: Recent Labs  Lab  09/15/18 0738 09/15/18 1509 09/16/18 0601 09/17/18 0504 09/20/18 0648 09/20/18 0700 09/21/18 0356 09/21/18 0906  WBC 5.7 5.0 4.7 4.7 4.6  --  5.1  --   NEUTROABS 3.6  --   --   --  2.7  --   --   --   HGB 8.2* 8.4* 7.6* 7.4* 7.0* 7.5* 9.1* 8.9*  HCT 25.8* 26.6* 23.6* 23.0* 22.8* 22.0* 27.7* 28.4*  MCV 81.6 83.1 80.8 80.4 81.1  --  77.6*  --   PLT 324 308 283 279 279  --  275  --    Basic Metabolic Panel: Recent Labs  Lab 09/15/18 0738  09/16/18 0601 09/17/18 0504 09/20/18 0648 09/20/18 0700 09/21/18 0356  NA 138  --  143 143 135 138 140  K 3.7  --  3.5 3.7 3.9 4.0 3.8  CL 101  --  105 106 101 103 106  CO2 24  --  26 27 23   --  23  GLUCOSE 138*  --  111* 71 142* 141* 91  BUN 40*  --  36* 40* 38* 36* 34*  CREATININE 2.54*   < > 2.32* 2.61* 2.69* 3.00* 2.50*  CALCIUM 8.5*  --  8.5* 8.3* 8.9  --  8.9   < > = values in this interval not displayed.   GFR: Estimated Creatinine Clearance: 13.2 mL/min (A) (by C-G formula based on SCr of 2.5 mg/dL (H)). Liver Function Tests: Recent Labs  Lab 09/20/18 0648 09/21/18 0356  AST 59* 47*  ALT 31 30  ALKPHOS 80 75  BILITOT 0.4 0.8  PROT 7.7 7.9  ALBUMIN 2.8* 2.8*   No results for input(s): LIPASE, AMYLASE in the last 168 hours. No results for input(s): AMMONIA in the last 168 hours. Coagulation Profile: Recent Labs  Lab 09/20/18 0648  INR 1.13   Cardiac Enzymes: Recent Labs  Lab 09/15/18 1205 09/15/18 1509 09/15/18 1838 09/21/18 0356 09/21/18 0906  TROPONINI 0.06* 0.07* 0.08* 0.04* 0.03*   BNP (last 3 results) No results for input(s): PROBNP in the last 8760 hours. HbA1C: Recent Labs    09/21/18 0356  HGBA1C 5.8*   CBG: Recent Labs  Lab 09/17/18 0743 09/17/18 1216 09/20/18 0912 09/21/18 1104 09/21/18 1700  GLUCAP 73 103* 122* 176* 108*   Lipid Profile: Recent Labs    09/21/18 0356  CHOL 121  HDL 43  LDLCALC 60  TRIG 91  CHOLHDL 2.8   Thyroid Function Tests: No results for input(s): TSH,  T4TOTAL, FREET4, T3FREE, THYROIDAB in the last 72 hours. Anemia Panel: No results for input(s): VITAMINB12, FOLATE, FERRITIN, TIBC, IRON, RETICCTPCT in the last 72 hours. Sepsis Labs: No results for input(s): PROCALCITON, LATICACIDVEN in the last 168 hours.  No results found for this or any previous visit (from the past 240 hour(s)).       Radiology Studies: Dg Chest 2 View  Result Date: 09/20/2018 CLINICAL DATA:  Weakness. EXAM: CHEST - 2 VIEW COMPARISON:  Radiographs of September 16, 2018. FINDINGS: Stable cardiomegaly. Atherosclerosis of thoracic aorta is noted. No pneumothorax is noted. Stable bibasilar atelectasis or edema is noted with associated pleural effusions. Bony thorax  is unremarkable. IMPRESSION: Stable bilateral pulmonary edema or atelectasis is noted with associated pleural effusions. Aortic Atherosclerosis (ICD10-I70.0). Electronically Signed   By: Marijo Conception, M.D.   On: 09/20/2018 08:42   Ct Head Wo Contrast  Result Date: 09/20/2018 CLINICAL DATA:  Slurred speech, transient EXAM: CT HEAD WITHOUT CONTRAST TECHNIQUE: Contiguous axial images were obtained from the base of the skull through the vertex without intravenous contrast. COMPARISON:  Head CT December 31, 2013 and brain MRI December 31, 2013 FINDINGS: Brain: Moderate diffuse atrophy is stable. There is no intracranial mass, hemorrhage, extra-axial fluid collection, or midline shift. There is small vessel disease throughout the centra semiovale bilaterally. There is evidence of a prior small infarct in the lateral left thalamus. There is small vessel disease throughout portions of the internal and external capsule regions bilaterally. Small vessel disease in the mid brain region is present, somewhat better seen on prior studies. No acute infarct is demonstrated currently. Vascular: There are no appreciable hyperdense vessels. There is calcification in each distal vertebral artery and carotid siphon region. Skull: The bony  calvarium appears intact. Sinuses/Orbits: There is a retention cyst in the medial left maxillary antrum. There is mucosal thickening in several ethmoid air cells. Orbits appear symmetric bilaterally. Patient has had cataract removals bilaterally. Other: Mastoid air cells are clear. IMPRESSION: Atrophy with extensive supratentorial small vessel disease. Midbrain small vessel disease is present, better seen on prior studies. No acute infarct appreciable. No mass or hemorrhage. There are multiple foci of arterial vascular calcification. There are foci of paranasal sinus disease. Electronically Signed   By: Lowella Grip III M.D.   On: 09/20/2018 08:19   Mr Brain Wo Contrast  Result Date: 09/20/2018 CLINICAL DATA:  Sudden onset of slurred speech and expressive aphasia EXAM: MRI HEAD WITHOUT CONTRAST MRA HEAD WITHOUT CONTRAST TECHNIQUE: Multiplanar, multiecho pulse sequences of the brain and surrounding structures were obtained without intravenous contrast. Angiographic images of the head were obtained using MRA technique without contrast. COMPARISON:  Head CT from earlier today FINDINGS: MRI HEAD FINDINGS Brain: No acute infarction, hemorrhage, hydrocephalus, extra-axial collection or mass lesion. Prominent atrophy with ventriculomegaly. There is a perisylvian predominance. Chronic small vessel ischemia with confluent gliosis in the cerebral white matter and seen across the pons. There is superficial siderosis in the posterior right cerebrum, superior temporal region, with no acute blood products by head CT. This area is stable from prior and is likely from prior trauma. Vascular: Arterial findings below. Normal dural venous sinus flow voids. Skull and upper cervical spine: No focal marrow lesion Sinuses/Orbits: Bilateral cataract resection. 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 MRA HEAD FINDINGS Significant motion artifact, especially at the level of the skull base. There is likely a fenestration of the proximal basilar.  Both vertebrals, carotids, and the basilar are patent. There is no branch occlusion or proximal flow limiting stenosis seen. No gross aneurysm. IMPRESSION: Brain MRI: 1. No acute finding. 2. Atrophy and chronic small vessel ischemia. 3. Superficial siderosis in the right cerebrum that is nonprogressive since 2015. Intracranial MRA: Negative when accounting for moderate motion artifact. Electronically Signed   By: Monte Fantasia M.D.   On: 09/20/2018 14:09   Mr Jodene Nam Head Wo Contrast  Result Date: 09/20/2018 CLINICAL DATA:  Sudden onset of slurred speech and expressive aphasia EXAM: MRI HEAD WITHOUT CONTRAST MRA HEAD WITHOUT CONTRAST TECHNIQUE: Multiplanar, multiecho pulse sequences of the brain and surrounding structures were obtained without intravenous contrast. Angiographic images of the head were obtained using MRA  technique without contrast. COMPARISON:  Head CT from earlier today FINDINGS: MRI HEAD FINDINGS Brain: No acute infarction, hemorrhage, hydrocephalus, extra-axial collection or mass lesion. Prominent atrophy with ventriculomegaly. There is a perisylvian predominance. Chronic small vessel ischemia with confluent gliosis in the cerebral white matter and seen across the pons. There is superficial siderosis in the posterior right cerebrum, superior temporal region, with no acute blood products by head CT. This area is stable from prior and is likely from prior trauma. Vascular: Arterial findings below. Normal dural venous sinus flow voids. Skull and upper cervical spine: No focal marrow lesion Sinuses/Orbits: Bilateral cataract resection. 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 MRA HEAD FINDINGS Significant motion artifact, especially at the level of the skull base. There is likely a fenestration of the proximal basilar. Both vertebrals, carotids, and the basilar are patent. There is no branch occlusion or proximal flow limiting stenosis seen. No gross aneurysm. IMPRESSION: Brain MRI: 1. No acute finding. 2. Atrophy  and chronic small vessel ischemia. 3. Superficial siderosis in the right cerebrum that is nonprogressive since 2015. Intracranial MRA: Negative when accounting for moderate motion artifact. Electronically Signed   By: Monte Fantasia M.D.   On: 09/20/2018 14:09        Scheduled Meds: . aspirin EC  81 mg Oral Daily  . carvedilol  12.5 mg Oral BID WC  . clopidogrel  75 mg Oral Q breakfast  . famotidine  20 mg Oral BID  . furosemide  40 mg Intravenous Daily  . glipiZIDE  2.5 mg Oral BID AC   Continuous Infusions:   LOS: 0 days    Time spent: 35 minutes    Hosie Poisson, MD [facility] Pager [phone]  If 7PM-7AM, please contact night-coverage [email or web address] Password [facility] 09/21/2018, 6:31 PM

## 2018-09-21 NOTE — Evaluation (Signed)
Clinical/Bedside Swallow Evaluation Patient Details  Name: [name] MRN: [id number] Date of Birth: November 20, 1926  Today's Date: 09/21/2018 Time: SLP Start Time (ACUTE ONLY): 6237 SLP Stop Time (ACUTE ONLY): 0913 SLP Time Calculation (min) (ACUTE ONLY): 8 min  Past Medical History:  Past Medical History:  Diagnosis Date  . ACS (acute coronary syndrome) ([place]) 08/2018  . Anemia    family unsure of this history  . Arthritis   . CHF (congestive heart failure) ([place])   . Chronic ulcer of calf (HCC)    Left  . Constipation   . Diabetes mellitus without complication ([place])   . Dyslipidemia   . Dyspnea   . GERD (gastroesophageal reflux disease)   . Hypertension   . Peripheral vascular disease [facility])    Past Surgical History:  Past Surgical History:  Procedure Laterality Date  . ABDOMINAL AORTOGRAM W/LOWER EXTREMITY Left 12/27/2017   Procedure: ABDOMINAL AORTOGRAM W/LOWER EXTREMITY;  Surgeon: Algernon Huxley, MD;  Location: [place] [facility];  Service: Cardiovascular;  Laterality: Left;  . ABDOMINAL HYSTERECTOMY    . AMPUTATION TOE Left 12/23/2017   Procedure: AMPUTATION TOE LEFT GREAT TOE;  Surgeon: Sharlotte Alamo, DPM;  Location: [facility] ORS;  Service: Podiatry;  Laterality: Left;  . ENDARTERECTOMY FEMORAL Left 09/08/2017   Procedure: ENDARTERECTOMY FEMORAL WITH CORMATRIX PATCH;  Surgeon: Algernon Huxley, MD;  Location: [facility] ORS;  Service: Vascular;  Laterality: Left;  . EYE SURGERY Bilateral    Cataract Extraction with IOL  . LOWER EXTREMITY ANGIOGRAPHY Left 07/22/2017   Procedure: Lower Extremity Angiography;  Surgeon: Algernon Huxley, MD;  Location: [place] [facility];  Service: Cardiovascular;  Laterality: Left;  . LOWER EXTREMITY ANGIOGRAPHY Left 08/16/2017   Procedure: Lower Extremity Angiography;  Surgeon: Algernon Huxley, MD;  Location: [place] [facility];  Service: Cardiovascular;  Laterality: Left;  Marland Kitchen MULTIPLE TOOTH EXTRACTIONS     HPI:  [name] is a 84 y.o. female who  presents to the Emergency Department complaining of TIA. Presents to the emergency department via EMS for evaluation of possible TIA/stroke. Level V caveat due to confusion. She was last seen normal at 10 PM last night. This morning she experienced difficulty speaking and she got her son who notified the neighbor and called 911. On EMS arrival she was noted to have expressive aphasia as well as left sided facial droop. Overall her symptoms have completely resolved. She states that she was feeling a little bit confused earlier but feels well currently. She has recently had to hospital admissions for CHF exacerbation. She has CKD. She takes aspirin and Plavix. She denies any fevers, chest pain, black or body stools. She does have a history of prior TIA 2 to 3 years ago. MRi didn't find any acute findings but documented atrophy and chronic small vessel ischemia and sperficial siderosis in the right cerebrum that is nonprogressive since 2015.    Assessment / Plan / Recommendation Clinical Impression  Pt presents with functional oropharyngeal abilities when consuming trials of regular snack and thin liquids via straw. Pt's son was present and advises that at baseline she consumes smaller pills whole with thin liquids and larger pills are crushed in applesacue. Education provided to MD and nursing on this recommendation as well as upgrade to regular diet with thin liquids. ST to sign off.  SLP Visit Diagnosis: Dysphagia, oropharyngeal phase (R13.12)    Aspiration Risk  No limitations    Diet Recommendation Regular;Thin liquid   Liquid Administration via:  Cup;Straw Medication Administration: Whole meds with liquid(larger pills curshed with puree) Supervision: Patient able to self feed Compensations: Minimize environmental distractions;Slow rate;Small sips/bites Postural Changes: Seated upright at 90 degrees    Other  Recommendations Oral Care Recommendations: Oral care BID   Follow up Recommendations  None        Swallow Study   General Date of Onset: 09/20/18 HPI: [name] is a 84 y.o. female who presents to the Emergency Department complaining of TIA. Presents to the emergency department via EMS for evaluation of possible TIA/stroke. Level V caveat due to confusion. She was last seen normal at 10 PM last night. This morning she experienced difficulty speaking and she got her son who notified the neighbor and called 911. On EMS arrival she was noted to have expressive aphasia as well as left sided facial droop. Overall her symptoms have completely resolved. She states that she was feeling a little bit confused earlier but feels well currently. She has recently had to hospital admissions for CHF exacerbation. She has CKD. She takes aspirin and Plavix. She denies any fevers, chest pain, black or body stools. She does have a history of prior TIA 2 to 3 years ago. MRi didn't find any acute findings but documented atrophy and chronic small vessel ischemia and sperficial siderosis in the right cerebrum that is nonprogressive since 2015.  Type of Study: Bedside Swallow Evaluation Previous Swallow Assessment: none in chart Diet Prior to this Study: Regular;Thin liquids Temperature Spikes Noted: No Respiratory Status: Room air History of Recent Intubation: No Behavior/Cognition: Alert;Cooperative;Pleasant mood Oral Cavity Assessment: Within Functional Limits Oral Care Completed by SLP: No Oral Cavity - Dentition: Dentures, top;Dentures, bottom Vision: Functional for self-feeding Self-Feeding Abilities: Needs assist;Able to feed self Patient Positioning: Upright in bed Baseline Vocal Quality: Normal Volitional Cough: Strong Volitional Swallow: Able to elicit    Oral/Motor/Sensory Function Overall Oral Motor/Sensory Function: Generalized oral weakness   Ice Chips Ice chips: Not tested   Thin Liquid Thin Liquid: Within functional limits Presentation: Self Fed;Straw    Nectar Thick  Nectar Thick Liquid: Not tested   Honey Thick Honey Thick Liquid: Not tested   Puree Puree: Within functional limits Presentation: Self Fed;Spoon   Solid     Solid: Within functional limits Presentation: [name] 09/21/2018,11:38 AM

## 2018-09-21 NOTE — Patient Outreach (Signed)
[facility]) Care Management  [place]  09/21/2018  [name] 1926/06/10 [id number]   Reason for referral: medication management/review  Patient currently admitted to hospital for TIA/OBS.    Plan:  -Will follow up with patient once discharged   Regina Eck, PharmD, [place]  [phone]

## 2018-09-21 NOTE — Care Management Note (Addendum)
Case Management Note  Patient Details  Name: [name] MRN: [id number] Date of Birth: 1926/01/11  Subjective/Objective:    Pt in with TIA. She is from home with her son. Per patient and son she has 24 hour supervision at home. Pt recently d/ced from the hospital and was set up with [facility] for [facility] PT/RN. [facility] has not been to patients home d/t readmission to the hospital. CM notified Butch Penny with [facility] of her admission. Pt d/ced last visit with home oxygen through [facility]. CM reminded the son to bring tank at time of d/c.    Other DME: walker, cane and seat in tub   Pt denies any issues obtaining her medications. Family provides transportation.    PCP: Dr Criss Rosales         Action/Plan: Recommendations are for [facility] services at d/c. They would like to continue with [facility]. CM following and will update [facility] when she is d/ced.   Expected Discharge Date:                  Expected Discharge Plan:  [place]  In-House Referral:     Discharge planning Services  CM Consult  Post Acute Care Choice:  Home Health, Resumption of Svcs/PTA Provider Choice offered to:  Patient, Adult Children  DME Arranged:    DME Agency:     HH Arranged:  RN, PT [place] Agency:  [place]  Status of Service:  In process, will continue to follow  If discussed at Long Length of Stay Meetings, dates discussed:    Additional Comments:  Pollie Friar, RN 09/21/2018, 12:46 PM

## 2018-09-21 NOTE — Evaluation (Signed)
Occupational Therapy Evaluation Patient Details Name: [name] MRN: [id number] DOB: [birth year] Today's Date: 09/21/2018    History of Present Illness [name] is a 84 y.o. female with medical history significant of CAD, history of acute coronary syndrome, chronic combined systolic and diastolic heart failure, history of left calf ulcer, constipation, type 2 diabetes, hyperlipidemia, dyspnea, GERD, hypertension, peripheral vascular disease who was recently admitted from 09/15/2018 to 09/17/2018 at [facility] due to hypoxia secondary to congestive heart failure and is being brought to the emergency department now after having sudden onset of slurred speech, expressive aphasia and left-sided facial droop while she was talking to her son.     Clinical Impression   This 84 y/o female presents with the above. At baseline pt reports she is mod independent with ADLs and functional mobility, using RW/SPC PRN. Pt presents with generalized weakness, decreased activity tolerance and dynamic balance. Pt requires consistent minA for room level mobility using RW and verbal cues for maintaining proximity to RW. She currently requires minguardA for seated UB ADLs, min-modA for LB ADLs. Pt reports she lives with one of her sons who is available 24hr at home. Pt will benefit from continued OT services, recommend follow up [place] services at time of discharge to maximize pt's overall strength, safety and independence with ADLs and functional mobility. Will continue to follow acutely.     Follow Up Recommendations  Home health OT;Supervision/Assistance - 24 hour    Equipment Recommendations  None recommended by OT           Precautions / Restrictions Precautions Precautions: Fall Restrictions Weight Bearing Restrictions: No      Mobility Bed Mobility Overal bed mobility: Needs Assistance Bed Mobility: Supine to Sit;Sit to Supine     Supine to sit: Supervision;HOB elevated Sit to supine: Min  guard;HOB elevated   General bed mobility comments: for general safety, no physical assist required, HOB elevated; increased effort when returning to supine  Transfers Overall transfer level: Needs assistance Equipment used: Rolling walker (2 wheeled) Transfers: Sit to/from Stand Sit to Stand: Min assist;Mod assist         General transfer comment: pt requiring heavy minA-light modA for boosting into standing; pt demonstrates safe hand placement for initial sit<>stand; pt with poor eccentric control when returning to sitting EOB    Balance Overall balance assessment: Needs assistance Sitting-balance support: Feet supported;No upper extremity supported Sitting balance-Leahy Scale: Fair Sitting balance - Comments: slight posterior lean though with no LOB Postural control: Posterior lean Standing balance support: Bilateral upper extremity supported Standing balance-Leahy Scale: Poor Standing balance comment: requires UE support during this session                            ADL either performed or assessed with clinical judgement   ADL Overall ADL's : Needs assistance/impaired Eating/Feeding: Set up;Sitting   Grooming: Wash/dry face;Set up;Sitting Grooming Details (indicate cue type and reason): sitting EOB Upper Body Bathing: Min guard;Sitting   Lower Body Bathing: Minimal assistance;Sit to/from stand   Upper Body Dressing : Min guard;Sitting   Lower Body Dressing: Moderate assistance;Sit to/from stand Lower Body Dressing Details (indicate cue type and reason): minA standing balance Toilet Transfer: Minimal assistance;Ambulation;Comfort height toilet;RW Armed forces technical officer Details (indicate cue type and reason): simulated in transfer to/from EOB Toileting- Clothing Manipulation and Hygiene: Minimal assistance;Sit to/from stand       Functional mobility during ADLs: Minimal assistance;Rolling walker General ADL Comments:  pt requires cues for proximity to RW;  overall presenting with generalized weakness; requires consistent MinA during room level mobility (ambulating to door and back to EOB during this session)     Vision Baseline Vision/History: Wears glasses Wears Glasses: At all times Patient Visual Report: No change from baseline Vision Assessment?: No apparent visual deficits Additional Comments: will continue to assess     Perception     Praxis      Pertinent Vitals/Pain Pain Assessment: No/denies pain     Hand Dominance Right   Extremity/Trunk Assessment Upper Extremity Assessment Upper Extremity Assessment: Generalized weakness   Lower Extremity Assessment Lower Extremity Assessment: Defer to PT evaluation   Cervical / Trunk Assessment Cervical / Trunk Assessment: Normal   Communication Communication Communication: HOH(slurring has resolved)   Cognition Arousal/Alertness: Awake/alert Behavior During Therapy: WFL for tasks assessed/performed Overall Cognitive Status: History of cognitive impairments - at baseline                                 General Comments: h/o dementia. Pt A&Ox3 and overall appropriate on eval; noted decreased STM and requires increased time to follow commands   General Comments  Pt on 3L O2 during session with VSS    Exercises     Shoulder Instructions      Home Living Family/patient expects to be discharged to:: Private residence Living Arrangements: Children Available Help at Discharge: Family;Available 24 hours/day Type of Home: House Home Access: Stairs to enter [employer] of Steps: 2 Entrance Stairs-Rails: Right Home Layout: One level     Bathroom Shower/Tub: Teacher, early years/pre: Standard     Home Equipment: Shower seat;Grab bars - toilet;Grab bars - tub/shower;Cane - single point;Walker - 2 wheels   Additional Comments: pt lives with son who has had a stroke but he is independent and does some of the cooking  Lives With: Son     Prior Functioning/Environment Level of Independence: Independent with assistive device(s)        Comments:  Uses RW/cane as needed.  Pt lives with her son who does most of the cooking and cleaning.  mod Independent with ADL's.        OT Problem List: Decreased strength;Decreased range of motion;Decreased activity tolerance;Impaired balance (sitting and/or standing)      OT Treatment/Interventions: Self-care/ADL training;Therapeutic exercise;DME and/or AE instruction;Therapeutic activities;Patient/family education;Balance training    OT Goals(Current goals can be found in the care plan section) Acute Rehab OT Goals Patient Stated Goal: to resume general daily activity at home with use of SPC. OT Goal Formulation: With patient Time For Goal Achievement: 10/05/18 Potential to Achieve Goals: Good  OT Frequency: Min 2X/week   Barriers to D/C:            Co-evaluation              AM-PAC PT "6 Clicks" Daily Activity     Outcome Measure Help from another person eating meals?: None Help from another person taking care of personal grooming?: A Little Help from another person toileting, which includes using toliet, bedpan, or urinal?: A Little Help from another person bathing (including washing, rinsing, drying)?: A Little Help from another person to put on and taking off regular upper body clothing?: A Little Help from another person to put on and taking off regular lower body clothing?: A Lot 6 Click Score: 18   End of Session Equipment Utilized  During Treatment: Gait belt;Rolling walker;Oxygen Nurse Communication: Mobility status  Activity Tolerance: Patient tolerated treatment well Patient left: in bed;with call bell/phone within reach;with bed alarm set;with family/visitor present  OT Visit Diagnosis: Muscle weakness (generalized) (M62.81)                Time: 1597-3312 OT Time Calculation (min): 25 min Charges:  OT General Charges $OT Visit: 1 Visit OT  Evaluation $OT Eval Moderate Complexity: 1 Mod OT Treatments $Self Care/Home Management : 8-22 mins  Lou Cal, OT Supplemental Rehabilitation Services Pager [phone] Office [phone]  Raymondo Band 09/21/2018, 12:16 PM

## 2018-09-21 NOTE — Consult Note (Signed)
   [facility] CM Inpatient Consult   09/21/2018  [name] [birth year] [id number]  Patient is brand new active with [facility] for chronic disease management services.  Patient has been engaged by a [facility] RN [facility] but unfortunately the patient has been hospitalized.    Our community based plan of care has focused on disease management and community resource support.  Patient will receive a post hospital call and will be evaluated for monthly home visits for assessments and disease process education. Went by to speak with patient and she is post procedure and asleep on round.  Did not disturb at this time. Will follow up.  Of note, [facility] Care Management services does not replace or interfere with any services that are needed or arranged by inpatient case management or social work.  For additional questions or referrals please contact:  Natividad Brood, RN BSN [facility] Liaison  [phone] business mobile phone Toll free office [phone]

## 2018-09-21 NOTE — Evaluation (Signed)
Speech Language Pathology Evaluation Patient Details Name: [name] MRN: [id number] DOB: 09/19/1926 Today's Date: 09/21/2018 Time: 9753-0051 SLP Time Calculation (min) (ACUTE ONLY): 9 min  Problem List:  Patient Active Problem List   Diagnosis Date Noted  . Acute on chronic combined systolic and diastolic congestive heart failure ([place]) 09/20/2018  . Hypothermia 09/20/2018  . CHF (congestive heart failure) ([place]) 09/16/2018  . Advanced care planning/counseling discussion   . Goals of care, counseling/discussion   . Acute on chronic systolic heart failure ([place]) 09/15/2018  . Acute congestive heart failure ([place])   . Palliative care by specialist   . DNR (do not resuscitate)   . Weakness generalized   . Acute HFrEF (heart failure with reduced ejection fraction) ([place]) 09/10/2018  . CKD (chronic kidney disease) stage 4, GFR 15-29 ml/min (HCC) 09/10/2018  . Acute coronary syndrome ([place]) 09/09/2018  . Anemia in chronic kidney disease 08/25/2018  . Gangrene of toe of left foot ([place]) 12/23/2017  . PAD (peripheral artery disease) ([place]) 09/21/2017  . Atherosclerosis of artery of extremity with ulceration ([place]) 09/08/2017  . Atherosclerosis of native arteries of the extremities with ulceration ([place]) 06/25/2017  . Stroke ([place]) 12/31/2013  . TIA (transient ischemic attack) 12/31/2013  . Acute ischemic stroke ([place]) 12/31/2013  . ARF (acute renal failure) ([place]) 12/31/2013  . Hypertension   . Diabetes mellitus without complication ([place])   . Dyslipidemia   . Constipation   . Anemia    Past Medical History:  Past Medical History:  Diagnosis Date  . ACS (acute coronary syndrome) ([place]) 08/2018  . Anemia    family unsure of this history  . Arthritis   . CHF (congestive heart failure) ([place])   . Chronic ulcer of calf (HCC)    Left  . Constipation   . Diabetes mellitus without complication ([place])   . Dyslipidemia   . Dyspnea   . GERD (gastroesophageal reflux disease)   .  Hypertension   . Peripheral vascular disease [facility])    Past Surgical History:  Past Surgical History:  Procedure Laterality Date  . ABDOMINAL AORTOGRAM W/LOWER EXTREMITY Left 12/27/2017   Procedure: ABDOMINAL AORTOGRAM W/LOWER EXTREMITY;  Surgeon: Algernon Huxley, MD;  Location: [facility];  Service: Cardiovascular;  Laterality: Left;  . ABDOMINAL HYSTERECTOMY    . AMPUTATION TOE Left 12/23/2017   Procedure: AMPUTATION TOE LEFT GREAT TOE;  Surgeon: Sharlotte Alamo, DPM;  Location: [facility] ORS;  Service: Podiatry;  Laterality: Left;  . ENDARTERECTOMY FEMORAL Left 09/08/2017   Procedure: ENDARTERECTOMY FEMORAL WITH CORMATRIX PATCH;  Surgeon: Algernon Huxley, MD;  Location: [facility] ORS;  Service: Vascular;  Laterality: Left;  . EYE SURGERY Bilateral    Cataract Extraction with IOL  . LOWER EXTREMITY ANGIOGRAPHY Left 07/22/2017   Procedure: Lower Extremity Angiography;  Surgeon: Algernon Huxley, MD;  Location: [facility];  Service: Cardiovascular;  Laterality: Left;  . LOWER EXTREMITY ANGIOGRAPHY Left 08/16/2017   Procedure: Lower Extremity Angiography;  Surgeon: Algernon Huxley, MD;  Location: [facility];  Service: Cardiovascular;  Laterality: Left;  Marland Kitchen MULTIPLE TOOTH EXTRACTIONS     HPI:  [name] is a 84 y.o. female who presents to the Emergency Department complaining of TIA. Presents to the emergency department via EMS for evaluation of possible TIA/stroke. Level V caveat due to confusion. She was last seen normal at 10 PM last night. This morning she experienced difficulty speaking and she got her son who notified the neighbor and  called 911. On EMS arrival she was noted to have expressive aphasia as well as left sided facial droop. Overall her symptoms have completely resolved. She states that she was feeling a little bit confused earlier but feels well currently. She has recently had to hospital admissions for CHF exacerbation. She has CKD. She takes aspirin and Plavix. She  denies any fevers, chest pain, black or body stools. She does have a history of prior TIA 2 to 3 years ago. MRi didn't find any acute findings but documented atrophy and chronic small vessel ischemia and sperficial siderosis in the right cerebrum that is nonprogressive since 2015.    Assessment / Plan / Recommendation Clinical Impression  Per son pt has baseline cognitive-linguistic deficits and at this time pt appears at baseline. She is oriented to herself, month and current situation, is able to demonstrate sustained attention, fluctuates in her accuracy with yes/no and is able to follow simple 1 step directions. Given son's report of baseline abilities and that she has help within home, don't recommend skilled ST at this time. ST to sign off.     SLP Assessment  SLP Recommendation/Assessment: Patient does not need any further Speech Lanaguage Pathology Services SLP Visit Diagnosis: Dysphagia, oropharyngeal phase (R13.12)    Follow Up Recommendations  None    Frequency and Duration           SLP Evaluation Cognition  Overall Cognitive Status: History of cognitive impairments - at baseline Arousal/Alertness: Awake/alert Orientation Level: Oriented to person;Oriented to place;Oriented to time;Disoriented to situation Attention: Sustained Sustained Attention: Appears intact       Comprehension  Auditory Comprehension Overall Auditory Comprehension: Impaired at baseline    Expression Expression Primary Mode of Expression: Verbal Verbal Expression Overall Verbal Expression: Appears within functional limits for tasks assessed Written Expression Dominant Hand: Right Written Expression: Not tested   Oral / Motor  Oral Motor/Sensory Function Overall Oral Motor/Sensory Function: Generalized oral weakness Motor Speech Overall Motor Speech: Appears within functional limits for tasks assessed   GO                    Thaison Kolodziejski 09/21/2018, 11:44 AM

## 2018-09-22 ENCOUNTER — Observation Stay (HOSPITAL_COMMUNITY): Payer: Medicare Other

## 2018-09-22 ENCOUNTER — Ambulatory Visit: Payer: Self-pay | Admitting: Pharmacist

## 2018-09-22 ENCOUNTER — Other Ambulatory Visit: Payer: Self-pay

## 2018-09-22 ENCOUNTER — Encounter (HOSPITAL_COMMUNITY): Payer: Self-pay | Admitting: *Deleted

## 2018-09-22 DIAGNOSIS — R0602 Shortness of breath: Secondary | ICD-10-CM | POA: Diagnosis not present

## 2018-09-22 DIAGNOSIS — I10 Essential (primary) hypertension: Secondary | ICD-10-CM | POA: Diagnosis not present

## 2018-09-22 DIAGNOSIS — G459 Transient cerebral ischemic attack, unspecified: Secondary | ICD-10-CM | POA: Diagnosis not present

## 2018-09-22 LAB — H. PYLORI ANTIBODY, IGG: H PYLORI IGG: 1.22 {index_val} — AB (ref 0.00–0.79)

## 2018-09-22 LAB — BASIC METABOLIC PANEL
Anion gap: 9 (ref 5–15)
BUN: 37 mg/dL — AB (ref 8–23)
CHLORIDE: 107 mmol/L (ref 98–111)
CO2: 24 mmol/L (ref 22–32)
CREATININE: 2.62 mg/dL — AB (ref 0.44–1.00)
Calcium: 9 mg/dL (ref 8.9–10.3)
GFR calc Af Amer: 17 mL/min — ABNORMAL LOW (ref >60)
GFR calc non Af Amer: 15 mL/min — ABNORMAL LOW (ref >60)
Glucose, Bld: 120 mg/dL — ABNORMAL HIGH (ref 70–99)
Potassium: 3.8 mmol/L (ref 3.5–5.1)
SODIUM: 140 mmol/L (ref 135–145)

## 2018-09-22 LAB — URINALYSIS, ROUTINE W REFLEX MICROSCOPIC
Bacteria, UA: NONE SEEN
Bilirubin Urine: NEGATIVE
Glucose, UA: NEGATIVE mg/dL
Hgb urine dipstick: NEGATIVE
Ketones, ur: NEGATIVE mg/dL
LEUKOCYTES UA: NEGATIVE
Nitrite: NEGATIVE
Protein, ur: 30 mg/dL — AB
SPECIFIC GRAVITY, URINE: 1.014 (ref 1.005–1.030)
pH: 7 (ref 5.0–8.0)

## 2018-09-22 LAB — CBC
HEMATOCRIT: 28 % — AB (ref 36.0–46.0)
HEMOGLOBIN: 9 g/dL — AB (ref 12.0–15.0)
MCH: 25.3 pg — ABNORMAL LOW (ref 26.0–34.0)
MCHC: 32.1 g/dL (ref 30.0–36.0)
MCV: 78.7 fL — ABNORMAL LOW (ref 80.0–100.0)
Platelets: 267 10*3/uL (ref 150–400)
RBC: 3.56 MIL/uL — ABNORMAL LOW (ref 3.87–5.11)
RDW: 17.6 % — ABNORMAL HIGH (ref 11.5–15.5)
WBC: 5.9 10*3/uL (ref 4.0–10.5)
nRBC: 0 % (ref 0.0–0.2)

## 2018-09-22 LAB — RAPID URINE DRUG SCREEN, HOSP PERFORMED
AMPHETAMINES: NOT DETECTED
BARBITURATES: NOT DETECTED
BENZODIAZEPINES: NOT DETECTED
COCAINE: NOT DETECTED
Opiates: NOT DETECTED
TETRAHYDROCANNABINOL: NOT DETECTED

## 2018-09-22 LAB — GLUCOSE, CAPILLARY
GLUCOSE-CAPILLARY: 112 mg/dL — AB (ref 70–99)
GLUCOSE-CAPILLARY: 182 mg/dL — AB (ref 70–99)

## 2018-09-22 MED ORDER — ATORVASTATIN CALCIUM 10 MG PO TABS: 10.0000 mg | ORAL_TABLET | Freq: Every day | ORAL | 1 refills | Status: AC

## 2018-09-22 MED ORDER — IPRATROPIUM-ALBUTEROL 0.5-2.5 (3) MG/3ML IN SOLN
3.0000 mL | Freq: Once | RESPIRATORY_TRACT | Status: AC
  Administered 2018-09-22: 3 mL via RESPIRATORY_TRACT
  Filled 2018-09-22: qty 3

## 2018-09-22 MED ORDER — HYDROXYZINE HCL 10 MG PO TABS: 10.0000 mg | ORAL_TABLET | Freq: Three times a day (TID) | ORAL | 0 refills | Status: AC | PRN

## 2018-09-22 MED ORDER — ASPIRIN EC 325 MG PO TBEC: 325.0000 mg | DELAYED_RELEASE_TABLET | Freq: Every day | ORAL | 0 refills | Status: DC

## 2018-09-22 MED ORDER — HYDROXYZINE HCL 10 MG PO TABS
10.0000 mg | ORAL_TABLET | Freq: Three times a day (TID) | ORAL | Status: DC | PRN
  Filled 2018-09-22: qty 1

## 2018-09-22 NOTE — Progress Notes (Signed)
Pt d/c home. No new concerns. Pt is stable. D/c education with teach back provided to pt and grand daughter. Both verbalize understanding. Pt will be transported out of hospital by family (granddaughte [employer]). Pt is transported out the hospital with Home oxygen tank

## 2018-09-22 NOTE — Care Management Note (Signed)
Case Management Note  Patient Details  Name: [name] MRN: [id number] Date of Birth: [birth year]  Subjective/Objective:                    Action/Plan: Pt discharging home with resumption of [facility] services. CM called Butch Penny with [facility] and informed her of d/c and of new [place] orders.  Pt has transportation home.   Expected Discharge Date:  09/22/18               Expected Discharge Plan:  [place]  In-House Referral:     Discharge planning Services  CM Consult  Post Acute Care Choice:  Home Health, Resumption of Svcs/PTA Provider Choice offered to:  Patient, Adult Children  DME Arranged:    DME Agency:     HH Arranged:  RN, PT, OT, Nurse's Aide, Social Work [employer] Agency:  [place]  Status of Service:  Completed, signed off  If discussed at [employer], dates discussed:    Additional Comments:  Pollie Friar, RN 09/22/2018, 10:34 AM

## 2018-09-23 ENCOUNTER — Other Ambulatory Visit: Payer: Self-pay | Admitting: *Deleted

## 2018-09-23 ENCOUNTER — Ambulatory Visit (INDEPENDENT_AMBULATORY_CARE_PROVIDER_SITE_OTHER): Payer: Medicare Other | Admitting: Vascular Surgery

## 2018-09-23 ENCOUNTER — Encounter (INDEPENDENT_AMBULATORY_CARE_PROVIDER_SITE_OTHER): Payer: Medicare Other

## 2018-09-23 DIAGNOSIS — R0602 Shortness of breath: Secondary | ICD-10-CM

## 2018-09-23 DIAGNOSIS — R778 Other specified abnormalities of plasma proteins: Secondary | ICD-10-CM

## 2018-09-23 DIAGNOSIS — R7989 Other specified abnormal findings of blood chemistry: Secondary | ICD-10-CM

## 2018-09-23 NOTE — Patient Outreach (Signed)
[facility]) Care Management  09/23/2018  [name] Feb 09, 1926 [id number]   Noted member was discharged from hospital yesterday.  Call placed to member's granddaughter, Lishel, no answer.  HIPAA compliant voice message left, will await call back.  Will make 3rd outreach attempt within the next 4 business days.  Valente David, [place], MSN [place] [phone]

## 2018-09-25 NOTE — Discharge Summary (Signed)
Physician Discharge Summary  [name]:[id number] DOB: [birth year] DOA: 09/20/2018  PCP: Lucianne Lei, MD  Admit date: 09/20/2018 Discharge date: 09/22/2018  Admitted From: Home.  Disposition:  Home.   Recommendations for Outpatient Follow-up:  1. Follow up with PCP in 1-2 weeks 2. Please obtain BMP/CBC in one week 3. Please follow up   Home Health:yes   Discharge [employer].  CODE STATUS: DNR Diet recommendation: Heart Healthy   Brief/Interim Summary: Alexa Hangartner Whitleyis a 84 y.o.femalewith medical history significant ofCAD, history of acute coronary syndrome, chronic combined systolic and diastolic heart failure, history of left calfulcer, constipation, type 2 diabetes, hyperlipidemia, dyspnea, GERD, hypertension, peripheral vascular disease who was recently admitted from 09/15/2018 to10/19/2019 at [facility] due to hypoxia secondary to congestive heart failure and is being brought to the emergency department now after having sudden onset of slurred speech, expressive aphasia and left-sided facial droop while she was talking to her son.   Discharge Diagnoses:  Principal Problem:   TIA (transient ischemic attack) Active Problems:   Hypertension   Dyslipidemia   Anemia   PAD (peripheral artery disease) (HCC)   CKD (chronic kidney disease) stage 4, GFR 15-29 ml/min (HCC)   Acute on chronic combined systolic and diastolic congestive heart failure (HCC)   Hypothermia  Generalized weakness She was initially admitted for evaluation of TIA, stroke work-up has been negative so far with negative MRI of the brain.  Therapy evaluations recommending home PT. Discharged on aspirin 325 mg and plavix.     Mild acute on chronic combined systolic and diastolic heart failure Started her on IV Lasix, diuresing appropriately.  Continue with daily weights and strict intake and output.  Given her advanced age renal disease and cardiac disease her prognosis Is poor and  cardiology did not recommend any revascularization procedures at this time.  currently  2 L of nasal cannula oxygen monitor overnight and if stable probably discharge her in the morning.   Peripheral artery disease Continue with aspirin and Plavix   Stage IV CKD GFR about 15-29 Stable.     Anemia of chronic disease probably secondary to chronic renal insufficiency GI consulted did not recommended any endoscopy procedures at this time.  Stool for occult blood is negative at this time.   Hyperlipidemia not on any medical therapy at this time.   Hypertension well controlled resume home blood pressure medications on discharge   Discharge Instructions  Discharge Instructions    Diet - low sodium heart healthy   Complete by:  As directed    Diet - low sodium heart healthy   Complete by:  As directed    Discharge instructions   Complete by:  As directed    Follow up with neurology in 2 to 4 weeks.  Please follow up with cardiology as recommended.     Allergies as of 09/22/2018   No Known Allergies     Medication List    STOP taking these medications   ACCU-CHEK FASTCLIX LANCETS Misc   ACCU-CHEK GUIDE test strip Generic drug:  glucose blood     TAKE these medications   ACCU-CHEK GUIDE w/Device Kit See admin instructions.   acetaminophen 325 MG tablet Commonly known as:  TYLENOL Take 2 tablets (650 mg total) by mouth every 6 (six) hours as needed for mild pain or headache (or Fever >/= 101).   aspirin EC 325 MG tablet Take 1 tablet (325 mg total) by mouth daily. What changed:    medication strength  how  much to take  additional instructions   atorvastatin 10 MG tablet Commonly known as:  LIPITOR Take 1 tablet (10 mg total) by mouth daily.   CALCIUM 500 + D 500-125 MG-UNIT Tabs Generic drug:  Calcium Carbonate-Vitamin D Take 1 tablet by mouth daily.   carvedilol 12.5 MG tablet Commonly known as:  COREG Take 1 tablet (12.5 mg total) by  mouth 2 (two) times daily with a meal.   clopidogrel 75 MG tablet Commonly known as:  PLAVIX Take 1 tablet (75 mg total) by mouth daily with breakfast.   furosemide 40 MG tablet Commonly known as:  LASIX Take 1 tablet (40 mg total) by mouth daily.   glipiZIDE 5 MG tablet Commonly known as:  GLUCOTROL Take 0.5 tablets (2.5 mg total) by mouth 2 (two) times daily before a meal. Always take with food   hydrOXYzine 10 MG tablet Commonly known as:  ATARAX/VISTARIL Take 1 tablet (10 mg total) by mouth 3 (three) times daily as needed for anxiety.   pantoprazole 20 MG tablet Commonly known as:  PROTONIX Take 1 tablet (20 mg total) by mouth daily.   polyethylene glycol packet Commonly known as:  MIRALAX / GLYCOLAX Take 17 g by mouth daily as needed (for constipation.).      Follow-up Information    Lucianne Lei, MD. Schedule an appointment as soon as possible for a visit in 1 week(s).   Specialty:  Family Medicine Contact information: [address] [place] [address] [phone]        Pixie Casino, MD .   Specialty:  Cardiology Contact information: [address] [place] [address] [phone]        [place]. Schedule an appointment as soon as possible for a visit in 4 week(s).   Contact information: [address]     [place] [address] [phone]         No Known Allergies  Consultations:  None.    Procedures/Studies: Dg Chest 2 View  Result Date: 09/20/2018 CLINICAL DATA:  Weakness. EXAM: CHEST - 2 VIEW COMPARISON:  Radiographs of September 16, 2018. FINDINGS: Stable cardiomegaly. Atherosclerosis of thoracic aorta is noted. No pneumothorax is noted. Stable bibasilar atelectasis or edema is noted with associated pleural effusions. Bony thorax is unremarkable. IMPRESSION: Stable bilateral pulmonary edema or atelectasis is noted with associated pleural effusions. Aortic  Atherosclerosis (ICD10-I70.0). Electronically Signed   By: Marijo Conception, M.D.   On: 09/20/2018 08:42   Dg Chest 2 View  Result Date: 09/16/2018 CLINICAL DATA:  CHF EXAM: CHEST - 2 VIEW COMPARISON:  09/15/2018 FINDINGS: Mild cardiomegaly. Interstitial prominence throughout the lungs, slightly improved. Persistent bibasilar opacities and small effusions. IMPRESSION: Slight interval improvement in the diffuse interstitial prominence/edema. Continued small bilateral effusions with bibasilar atelectasis or infiltrates. Electronically Signed   By: Rolm Baptise M.D.   On: 09/16/2018 08:22   Dg Chest 2 View  Result Date: 09/15/2018 CLINICAL DATA:  Shortness of breath began 6 days ago. Recently diagnosed with CHF. Persistent symptoms. EXAM: CHEST - 2 VIEW COMPARISON:  PA and lateral chest x-ray of September 11, 2018 FINDINGS: The lungs are well-expanded. There small bilateral pleural effusions. The interstitial markings are increased. Patchy airspace opacity previously demonstrated at the lung bases has improved. The cardiac silhouette remains enlarged. The pulmonary vascularity is engorged. There is calcification in the wall of the thoracic aorta. IMPRESSION: CHF with interstitial edema. Patchy airspace opacity in the right  infrahilar region is present. There are small bilateral pleural effusions which are slightly less conspicuous today. Thoracic aortic atherosclerosis. Electronically Signed   By: David  Martinique M.D.   On: 09/15/2018 08:50   Dg Chest 2 View  Result Date: 09/11/2018 CLINICAL DATA:  Shortness of breath and chest pain EXAM: CHEST - 2 VIEW COMPARISON:  09/09/2018 FINDINGS: Cardiac shadow is enlarged. Aortic calcifications are seen. Bibasilar infiltrates are noted increased from the prior exam. Stable vascular congestion and interstitial edema is noted. Small bilateral pleural effusions are noted as well. IMPRESSION: Bibasilar infiltrates and effusions with mild vascular congestion.  Electronically Signed   By: Inez Catalina M.D.   On: 09/11/2018 12:40   Dg Chest 2 View  Result Date: 09/09/2018 CLINICAL DATA:  Increased sob.Recent blood transfusion EXAM: CHEST - 2 VIEW COMPARISON:  None. FINDINGS: Heart size is enlarged and accentuated by technique. There are airspace filling opacities throughout the central perihilar regions and bilateral LOWER lobes. There are bilateral pleural effusions. There is atherosclerotic calcification of the thoracic aorta. Degenerative changes are seen in thoracic spine. IMPRESSION: Cardiomegaly and pulmonary edema. Electronically Signed   By: Nolon Nations M.D.   On: 09/09/2018 09:20   Ct Head Wo Contrast  Result Date: 09/20/2018 CLINICAL DATA:  Slurred speech, transient EXAM: CT HEAD WITHOUT CONTRAST TECHNIQUE: Contiguous axial images were obtained from the base of the skull through the vertex without intravenous contrast. COMPARISON:  Head CT December 31, 2013 and brain MRI December 31, 2013 FINDINGS: Brain: Moderate diffuse atrophy is stable. There is no intracranial mass, hemorrhage, extra-axial fluid collection, or midline shift. There is small vessel disease throughout the centra semiovale bilaterally. There is evidence of a prior small infarct in the lateral left thalamus. There is small vessel disease throughout portions of the internal and external capsule regions bilaterally. Small vessel disease in the mid brain region is present, somewhat better seen on prior studies. No acute infarct is demonstrated currently. Vascular: There are no appreciable hyperdense vessels. There is calcification in each distal vertebral artery and carotid siphon region. Skull: The bony calvarium appears intact. Sinuses/Orbits: There is a retention cyst in the medial left maxillary antrum. There is mucosal thickening in several ethmoid air cells. Orbits appear symmetric bilaterally. Patient has had cataract removals bilaterally. Other: Mastoid air cells are clear.  IMPRESSION: Atrophy with extensive supratentorial small vessel disease. Midbrain small vessel disease is present, better seen on prior studies. No acute infarct appreciable. No mass or hemorrhage. There are multiple foci of arterial vascular calcification. There are foci of paranasal sinus disease. Electronically Signed   By: Lowella Grip III M.D.   On: 09/20/2018 08:19   Mr Brain Wo Contrast  Result Date: 09/20/2018 CLINICAL DATA:  Sudden onset of slurred speech and expressive aphasia EXAM: MRI HEAD WITHOUT CONTRAST MRA HEAD WITHOUT CONTRAST TECHNIQUE: Multiplanar, multiecho pulse sequences of the brain and surrounding structures were obtained without intravenous contrast. Angiographic images of the head were obtained using MRA technique without contrast. COMPARISON:  Head CT from earlier today FINDINGS: MRI HEAD FINDINGS Brain: No acute infarction, hemorrhage, hydrocephalus, extra-axial collection or mass lesion. Prominent atrophy with ventriculomegaly. There is a perisylvian predominance. Chronic small vessel ischemia with confluent gliosis in the cerebral white matter and seen across the pons. There is superficial siderosis in the posterior right cerebrum, superior temporal region, with no acute blood products by head CT. This area is stable from prior and is likely from prior trauma. Vascular: Arterial findings below. Normal dural  venous sinus flow voids. Skull and upper cervical spine: No focal marrow lesion Sinuses/Orbits: Bilateral cataract resection. 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 MRA HEAD FINDINGS Significant motion artifact, especially at the level of the skull base. There is likely a fenestration of the proximal basilar. Both vertebrals, carotids, and the basilar are patent. There is no branch occlusion or proximal flow limiting stenosis seen. No gross aneurysm. IMPRESSION: Brain MRI: 1. No acute finding. 2. Atrophy and chronic small vessel ischemia. 3. Superficial siderosis in the right cerebrum that  is nonprogressive since 2015. Intracranial MRA: Negative when accounting for moderate motion artifact. Electronically Signed   By: Monte Fantasia M.D.   On: 09/20/2018 14:09   Nm Pulmonary Perf And Vent  Result Date: 09/11/2018 CLINICAL DATA:  Dyspnea.  Elevated D-dimer. EXAM: NUCLEAR MEDICINE VENTILATION - PERFUSION LUNG SCAN TECHNIQUE: Ventilation images were obtained in multiple projections using inhaled aerosol Tc-15mDTPA. Perfusion images were obtained in multiple projections after intravenous injection of Tc-970mAA. RADIOPHARMACEUTICALS:  32.6 mCi of Tc-9933mPA aerosol inhalation and 4.15 mCi Tc99m38m IV COMPARISON:  Chest x-ray dated 09/11/2018. FINDINGS: Ventilation: No focal ventilation defect. Perfusion: No wedge shaped peripheral perfusion defects to suggest acute pulmonary embolism. IMPRESSION: No evidence of pulmonary embolism. Electronically Signed   By: StanFranki Cabot.   On: 09/11/2018 14:37   Dg Chest Port 1 View  Result Date: 09/22/2018 CLINICAL DATA:  Shortness of breath. EXAM: PORTABLE CHEST 1 VIEW COMPARISON:  Radiographs of September 20, 2018. FINDINGS: Stable cardiomediastinal silhouette. Atherosclerosis of thoracic aorta is noted. No pneumothorax is noted. Stable bilateral perihilar and basilar opacities are noted concerning for edema with associated pleural effusions. Bony thorax is unremarkable. IMPRESSION: Stable bilateral lung opacities are noted concerning for edema with associated pleural effusions. Electronically Signed   By: JameMarijo ConceptionD.   On: 09/22/2018 09:53   Mr Mra Jodene Namd Wo Contrast  Result Date: 09/20/2018 CLINICAL DATA:  Sudden onset of slurred speech and expressive aphasia EXAM: MRI HEAD WITHOUT CONTRAST MRA HEAD WITHOUT CONTRAST TECHNIQUE: Multiplanar, multiecho pulse sequences of the brain and surrounding structures were obtained without intravenous contrast. Angiographic images of the head were obtained using MRA technique without contrast.  COMPARISON:  Head CT from earlier today FINDINGS: MRI HEAD FINDINGS Brain: No acute infarction, hemorrhage, hydrocephalus, extra-axial collection or mass lesion. Prominent atrophy with ventriculomegaly. There is a perisylvian predominance. Chronic small vessel ischemia with confluent gliosis in the cerebral white matter and seen across the pons. There is superficial siderosis in the posterior right cerebrum, superior temporal region, with no acute blood products by head CT. This area is stable from prior and is likely from prior trauma. Vascular: Arterial findings below. Normal dural venous sinus flow voids. Skull and upper cervical spine: No focal marrow lesion Sinuses/Orbits: Bilateral cataract resection. 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 MRA HEAD FINDINGS Significant motion artifact, especially at the level of the skull base. There is likely a fenestration of the proximal basilar. Both vertebrals, carotids, and the basilar are patent. There is no branch occlusion or proximal flow limiting stenosis seen. No gross aneurysm. IMPRESSION: Brain MRI: 1. No acute finding. 2. Atrophy and chronic small vessel ischemia. 3. Superficial siderosis in the right cerebrum that is nonprogressive since 2015. Intracranial MRA: Negative when accounting for moderate motion artifact. Electronically Signed   By: JonaMonte Fantasia.   On: 09/20/2018 14:09     Subjective: Feeling good, wants to go home. No chest pain, or sob, headache, dizziness.   Discharge Exam: Vitals:  09/22/18 0816 09/22/18 1034  BP: (!) 145/68   Pulse: 68 70  Resp: 18 18  Temp: 98.2 F (36.8 C)   SpO2: 100% 100%   Vitals:   09/22/18 0345 09/22/18 0500 09/22/18 0816 09/22/18 1034  BP: 135/67  (!) 145/68   Pulse: 73  68 70  Resp: _0 Temp:   98.2 F (36.8 C)   TempSrc:   Oral   SpO2: 91%  100% 100%  Weight:  56.6 kg      General: Pt is alert, awake, not in acute distress Cardiovascular: RRR, S1/S2 +, no rubs, no gallops Respiratory: CTA  bilaterally, no wheezing, no rhonchi Abdominal: Soft, NT, ND, bowel sounds + Extremities: no edema, no cyanosis    The results of significant diagnostics from this hospitalization (including imaging, microbiology, ancillary and laboratory) are listed below for reference.     Microbiology: No results found for this or any previous visit (from the past 240 hour(s)).   Labs: BNP (last 3 results) Recent Labs    09/09/18 1419 09/15/18 0738  BNP 676.4* 945.8*   Basic Metabolic Panel: Recent Labs  Lab 09/20/18 0648 09/20/18 0700 09/21/18 0356 09/22/18 0412  NA 135 138 140 140  K 3.9 4.0 3.8 3.8  CL 101 103 106 107  CO2 23  --  23 24  GLUCOSE 142* 141* 91 120*  BUN 38* 36* 34* 37*  CREATININE 2.69* 3.00* 2.50* 2.62*  CALCIUM 8.9  --  8.9 9.0   Liver Function Tests: Recent Labs  Lab 09/20/18 0648 09/21/18 0356  AST 59* 47*  ALT 31 30  ALKPHOS 80 75  BILITOT 0.4 0.8  PROT 7.7 7.9  ALBUMIN 2.8* 2.8*   No results for input(s): LIPASE, AMYLASE in the last 168 hours. No results for input(s): AMMONIA in the last 168 hours. CBC: Recent Labs  Lab 09/20/18 0648 09/20/18 0700 09/21/18 0356 09/21/18 0906 09/22/18 0412  WBC 4.6  --  5.1  --  5.9  NEUTROABS 2.7  --   --   --   --   HGB 7.0* 7.5* 9.1* 8.9* 9.0*  HCT 22.8* 22.0* 27.7* 28.4* 28.0*  MCV 81.1  --  77.6*  --  78.7*  PLT 279  --  275  --  267   Cardiac Enzymes: Recent Labs  Lab 09/21/18 0356 09/21/18 0906  TROPONINI 0.04* 0.03*   BNP: Invalid input(s): POCBNP CBG: Recent Labs  Lab 09/21/18 1104 09/21/18 1700 09/21/18 2105 09/22/18 0625 09/22/18 1132  GLUCAP 176* 108* 133* 112* 182*   D-Dimer No results for input(s): DDIMER in the last 72 hours. Hgb A1c No results for input(s): HGBA1C in the last 72 hours. Lipid Profile No results for input(s): CHOL, HDL, LDLCALC, TRIG, CHOLHDL, LDLDIRECT in the last 72 hours. Thyroid function studies No results for input(s): TSH, T4TOTAL, T3FREE,  THYROIDAB in the last 72 hours.  Invalid input(s): FREET3 Anemia work up No results for input(s): VITAMINB12, FOLATE, FERRITIN, TIBC, IRON, RETICCTPCT in the last 72 hours. Urinalysis    Component Value Date/Time   COLORURINE YELLOW 09/22/2018 [place] 09/22/2018 1349   LABSPEC 1.014 09/22/2018 1349   PHURINE 7.0 09/22/2018 1349   GLUCOSEU NEGATIVE 09/22/2018 1349   HGBUR NEGATIVE 09/22/2018 1349   BILIRUBINUR NEGATIVE 09/22/2018 1349   KETONESUR NEGATIVE 09/22/2018 1349   PROTEINUR 30 (A) 09/22/2018 1349   UROBILINOGEN 1.0 09/07/2016 1403   NITRITE NEGATIVE 09/22/2018 [place] 09/22/2018 1349  Sepsis Labs Invalid input(s): PROCALCITONIN,  WBC,  LACTICIDVEN Microbiology No results found for this or any previous visit (from the past 240 hour(s)).   Time coordinating discharge: 36 minutes  SIGNED:   Hosie Poisson, MD  [facility] 09/25/2018, 7:42 PM Pager   If 7PM-7AM, please contact night-coverage [email or web address] Password [email or web address]

## 2018-09-26 ENCOUNTER — Ambulatory Visit: Payer: Self-pay | Admitting: Pharmacist

## 2018-09-26 DIAGNOSIS — M1990 Unspecified osteoarthritis, unspecified site: Secondary | ICD-10-CM | POA: Diagnosis not present

## 2018-09-26 DIAGNOSIS — I700 Atherosclerosis of aorta: Secondary | ICD-10-CM | POA: Diagnosis not present

## 2018-09-26 DIAGNOSIS — E1122 Type 2 diabetes mellitus with diabetic chronic kidney disease: Secondary | ICD-10-CM | POA: Diagnosis not present

## 2018-09-26 DIAGNOSIS — Z7902 Long term (current) use of antithrombotics/antiplatelets: Secondary | ICD-10-CM | POA: Diagnosis not present

## 2018-09-26 DIAGNOSIS — I739 Peripheral vascular disease, unspecified: Secondary | ICD-10-CM | POA: Diagnosis not present

## 2018-09-26 DIAGNOSIS — Z7984 Long term (current) use of oral hypoglycemic drugs: Secondary | ICD-10-CM | POA: Diagnosis not present

## 2018-09-26 DIAGNOSIS — E1151 Type 2 diabetes mellitus with diabetic peripheral angiopathy without gangrene: Secondary | ICD-10-CM | POA: Diagnosis not present

## 2018-09-26 DIAGNOSIS — I429 Cardiomyopathy, unspecified: Secondary | ICD-10-CM | POA: Diagnosis not present

## 2018-09-26 DIAGNOSIS — L851 Acquired keratosis [keratoderma] palmaris et plantaris: Secondary | ICD-10-CM | POA: Diagnosis not present

## 2018-09-26 DIAGNOSIS — I5042 Chronic combined systolic (congestive) and diastolic (congestive) heart failure: Secondary | ICD-10-CM | POA: Diagnosis not present

## 2018-09-26 DIAGNOSIS — Z89412 Acquired absence of left great toe: Secondary | ICD-10-CM | POA: Diagnosis not present

## 2018-09-26 DIAGNOSIS — K219 Gastro-esophageal reflux disease without esophagitis: Secondary | ICD-10-CM | POA: Diagnosis not present

## 2018-09-26 DIAGNOSIS — Z95820 Peripheral vascular angioplasty status with implants and grafts: Secondary | ICD-10-CM | POA: Diagnosis not present

## 2018-09-26 DIAGNOSIS — Z8673 Personal history of transient ischemic attack (TIA), and cerebral infarction without residual deficits: Secondary | ICD-10-CM | POA: Diagnosis not present

## 2018-09-26 DIAGNOSIS — D631 Anemia in chronic kidney disease: Secondary | ICD-10-CM | POA: Diagnosis not present

## 2018-09-26 DIAGNOSIS — E785 Hyperlipidemia, unspecified: Secondary | ICD-10-CM | POA: Diagnosis not present

## 2018-09-26 DIAGNOSIS — N184 Chronic kidney disease, stage 4 (severe): Secondary | ICD-10-CM | POA: Diagnosis not present

## 2018-09-26 DIAGNOSIS — I2510 Atherosclerotic heart disease of native coronary artery without angina pectoris: Secondary | ICD-10-CM | POA: Diagnosis not present

## 2018-09-26 DIAGNOSIS — B351 Tinea unguium: Secondary | ICD-10-CM | POA: Diagnosis not present

## 2018-09-26 DIAGNOSIS — D500 Iron deficiency anemia secondary to blood loss (chronic): Secondary | ICD-10-CM | POA: Diagnosis not present

## 2018-09-26 DIAGNOSIS — Z7982 Long term (current) use of aspirin: Secondary | ICD-10-CM | POA: Diagnosis not present

## 2018-09-26 DIAGNOSIS — I083 Combined rheumatic disorders of mitral, aortic and tricuspid valves: Secondary | ICD-10-CM | POA: Diagnosis not present

## 2018-09-26 DIAGNOSIS — I130 Hypertensive heart and chronic kidney disease with heart failure and stage 1 through stage 4 chronic kidney disease, or unspecified chronic kidney disease: Secondary | ICD-10-CM | POA: Diagnosis not present

## 2018-09-27 DIAGNOSIS — I700 Atherosclerosis of aorta: Secondary | ICD-10-CM | POA: Diagnosis not present

## 2018-09-27 DIAGNOSIS — D500 Iron deficiency anemia secondary to blood loss (chronic): Secondary | ICD-10-CM | POA: Diagnosis not present

## 2018-09-27 DIAGNOSIS — D631 Anemia in chronic kidney disease: Secondary | ICD-10-CM | POA: Diagnosis not present

## 2018-09-27 DIAGNOSIS — E1122 Type 2 diabetes mellitus with diabetic chronic kidney disease: Secondary | ICD-10-CM | POA: Diagnosis not present

## 2018-09-27 DIAGNOSIS — Z89412 Acquired absence of left great toe: Secondary | ICD-10-CM | POA: Diagnosis not present

## 2018-09-27 DIAGNOSIS — Z7982 Long term (current) use of aspirin: Secondary | ICD-10-CM | POA: Diagnosis not present

## 2018-09-27 DIAGNOSIS — Z7902 Long term (current) use of antithrombotics/antiplatelets: Secondary | ICD-10-CM | POA: Diagnosis not present

## 2018-09-27 DIAGNOSIS — Z8673 Personal history of transient ischemic attack (TIA), and cerebral infarction without residual deficits: Secondary | ICD-10-CM | POA: Diagnosis not present

## 2018-09-27 DIAGNOSIS — I130 Hypertensive heart and chronic kidney disease with heart failure and stage 1 through stage 4 chronic kidney disease, or unspecified chronic kidney disease: Secondary | ICD-10-CM | POA: Diagnosis not present

## 2018-09-27 DIAGNOSIS — M1990 Unspecified osteoarthritis, unspecified site: Secondary | ICD-10-CM | POA: Diagnosis not present

## 2018-09-27 DIAGNOSIS — N184 Chronic kidney disease, stage 4 (severe): Secondary | ICD-10-CM | POA: Diagnosis not present

## 2018-09-27 DIAGNOSIS — I429 Cardiomyopathy, unspecified: Secondary | ICD-10-CM | POA: Diagnosis not present

## 2018-09-27 DIAGNOSIS — I2510 Atherosclerotic heart disease of native coronary artery without angina pectoris: Secondary | ICD-10-CM | POA: Diagnosis not present

## 2018-09-27 DIAGNOSIS — Z95820 Peripheral vascular angioplasty status with implants and grafts: Secondary | ICD-10-CM | POA: Diagnosis not present

## 2018-09-27 DIAGNOSIS — E785 Hyperlipidemia, unspecified: Secondary | ICD-10-CM | POA: Diagnosis not present

## 2018-09-27 DIAGNOSIS — E1151 Type 2 diabetes mellitus with diabetic peripheral angiopathy without gangrene: Secondary | ICD-10-CM | POA: Diagnosis not present

## 2018-09-27 DIAGNOSIS — Z7984 Long term (current) use of oral hypoglycemic drugs: Secondary | ICD-10-CM | POA: Diagnosis not present

## 2018-09-27 DIAGNOSIS — I5042 Chronic combined systolic (congestive) and diastolic (congestive) heart failure: Secondary | ICD-10-CM | POA: Diagnosis not present

## 2018-09-27 DIAGNOSIS — K219 Gastro-esophageal reflux disease without esophagitis: Secondary | ICD-10-CM | POA: Diagnosis not present

## 2018-09-27 DIAGNOSIS — I083 Combined rheumatic disorders of mitral, aortic and tricuspid valves: Secondary | ICD-10-CM | POA: Diagnosis not present

## 2018-09-27 NOTE — Progress Notes (Signed)
Patient ID: [name], female    DOB: 10-13-1926, 84 y.o.   MRN: [id number]  HPI  Ms Erxleben is a 84 y/o female with a history of DM, HTN, dyslipidemia, GERD, anemia and chronic heart failure.   Echo report from 09/21/18 reviewed and showed an EF of 25-30% along with moderate-severe MR and mildly increased PA pressure of 35 mm Hg.  Admitted 09/20/18 due to TIA symptoms. Brain MRI was negative. Initially given some IV lasix for HF symptoms. GI consult obtained and she was discharged after 2 days. Admitted 09/15/18 due to acute on chronic HF. Cardiology and palliative care consults were obtained. Initially given IV lasix. Discharged after 2 days. Admitted 09/09/18 due to acute HF symptoms. Initially given IV lasix. Cardiology and palliative care consults obtained. Medications adjusted and she was discharged after 4 days.   She presents today for her initial visit with a chief complaint of minimal shortness of breath upon moderate exertion. She says that this has been chronic in nature having been present for several years. She has associated fatigue along with this. She denies any difficulty sleeping, abdominal distention, palpitations, pedal edema, chest pain or dizziness. Hasn't been weighing herself daily.   Past Medical History:  Diagnosis Date  . ACS (acute coronary syndrome) ([place]) 08/2018  . Anemia    family unsure of this history  . Arthritis   . CHF (congestive heart failure) ([place])   . Chronic ulcer of calf (HCC)    Left  . Constipation   . Diabetes mellitus without complication ([place])   . Dyslipidemia   . Dyspnea   . GERD (gastroesophageal reflux disease)   . Hyperlipidemia   . Hypertension   . Peripheral vascular disease [facility])    Past Surgical History:  Procedure Laterality Date  . ABDOMINAL AORTOGRAM W/LOWER EXTREMITY Left 12/27/2017   Procedure: ABDOMINAL AORTOGRAM W/LOWER EXTREMITY;  Surgeon: Algernon Huxley, MD;  Location: [place] [facility];  Service:  Cardiovascular;  Laterality: Left;  . ABDOMINAL HYSTERECTOMY    . AMPUTATION TOE Left 12/23/2017   Procedure: AMPUTATION TOE LEFT GREAT TOE;  Surgeon: Sharlotte Alamo, DPM;  Location: [facility];  Service: Podiatry;  Laterality: Left;  . ENDARTERECTOMY FEMORAL Left 09/08/2017   Procedure: ENDARTERECTOMY FEMORAL WITH CORMATRIX PATCH;  Surgeon: Algernon Huxley, MD;  Location: [facility];  Service: Vascular;  Laterality: Left;  . EYE SURGERY Bilateral    Cataract Extraction with IOL  . LOWER EXTREMITY ANGIOGRAPHY Left 07/22/2017   Procedure: Lower Extremity Angiography;  Surgeon: Algernon Huxley, MD;  Location: [place] [facility];  Service: Cardiovascular;  Laterality: Left;  . LOWER EXTREMITY ANGIOGRAPHY Left 08/16/2017   Procedure: Lower Extremity Angiography;  Surgeon: Algernon Huxley, MD;  Location: [place] [facility];  Service: Cardiovascular;  Laterality: Left;  Marland Kitchen MULTIPLE TOOTH EXTRACTIONS     Family History  Problem Relation Age of Onset  . Hypertension Father    Social History   Tobacco Use  . Smoking status: Never Smoker  . Smokeless tobacco: Never Used  Substance Use Topics  . Alcohol use: No   No Known Allergies Prior to Admission medications   Medication Sig Start Date End Date Taking? Authorizing Provider  acetaminophen (TYLENOL) 325 MG tablet Take 2 tablets (650 mg total) by mouth every 6 (six) hours as needed for mild pain or headache (or Fever >/= 101). 09/13/18  Yes Roxan Hockey, MD  aspirin EC 325 MG tablet Take 1 tablet (325 mg total) by  mouth daily. 09/22/18  Yes Hosie Poisson, MD  atorvastatin (LIPITOR) 10 MG tablet Take 1 tablet (10 mg total) by mouth daily. 09/22/18  Yes Hosie Poisson, MD  Blood Glucose Monitoring Suppl (ACCU-CHEK GUIDE) w/Device KIT See admin instructions. 12/28/17  Yes [provider]  Calcium Carbonate-Vitamin D (CALCIUM 500 + D) 500-125 MG-UNIT TABS Take 1 tablet by mouth daily.    Yes [provider]  carvedilol (COREG) 12.5 MG tablet  Take 1 tablet (12.5 mg total) by mouth 2 (two) times daily with a meal. 09/13/18  Yes Emokpae, Courage, MD  clopidogrel (PLAVIX) 75 MG tablet Take 1 tablet (75 mg total) by mouth daily with breakfast. 09/13/18  Yes Emokpae, Courage, MD  furosemide (LASIX) 40 MG tablet Take 1 tablet (40 mg total) by mouth daily. Patient taking differently: Take 20 mg by mouth 2 (two) times daily.  09/17/18 11/16/18 Yes Sainani, Belia Heman, MD  glipiZIDE (GLUCOTROL) 5 MG tablet Take 0.5 tablets (2.5 mg total) by mouth 2 (two) times daily before a meal. Always take with food 09/13/18  Yes Emokpae, Courage, MD  hydrOXYzine (ATARAX/VISTARIL) 10 MG tablet Take 1 tablet (10 mg total) by mouth 3 (three) times daily as needed for anxiety. 09/22/18  Yes Hosie Poisson, MD  pantoprazole (PROTONIX) 20 MG tablet Take 1 tablet (20 mg total) by mouth daily. 07/13/14  Yes Kindl, Nelda Severe, MD  polyethylene glycol (MIRALAX / GLYCOLAX) packet Take 17 g by mouth daily as needed (for constipation.).    Yes [provider]    Review of Systems  Constitutional: Positive for fatigue (a little bit). Negative for appetite change.  HENT: Negative for congestion, postnasal drip and sore throat.   Eyes: Negative.   Respiratory: Positive for shortness of breath (minimal ).   Cardiovascular: Negative for chest pain, palpitations and leg swelling.  Gastrointestinal: Negative for abdominal distention and abdominal pain.  Endocrine: Negative.   Genitourinary: Negative.   Musculoskeletal: Negative for back pain and neck pain.  Skin: Negative.   Allergic/Immunologic: Negative.   Neurological: Negative for dizziness and light-headedness.  Hematological: Negative for adenopathy. Does not bruise/bleed easily.  Psychiatric/Behavioral: Negative for sleep disturbance (sleeping on 2 pillows).    Vitals:   09/28/18 1126  BP: 136/72  Pulse: 69  Resp: 18  SpO2: 100%  Weight: 119 lb 2 oz (54 kg)  Height: 5' 6"  (1.676 m)   Wt Readings from  Last 3 Encounters:  09/28/18 119 lb 2 oz (54 kg)  09/22/18 124 lb 12.5 oz (56.6 kg)  09/17/18 122 lb 3.2 oz (55.4 kg)   Lab Results  Component Value Date   CREATININE 2.62 (H) 09/22/2018   CREATININE 2.50 (H) 09/21/2018   CREATININE 3.00 (H) 09/20/2018    Physical Exam  Constitutional: She is oriented to person, place, and time. She appears well-developed and well-nourished.  HENT:  Head: Normocephalic and atraumatic.  Neck: Normal range of motion. Neck supple. No JVD present.  Cardiovascular: Normal rate and regular rhythm.  Pulmonary/Chest: Breath sounds normal. She has no rhonchi. She has no rales.  Abdominal: Soft. She exhibits no distension.  Musculoskeletal:       Right lower leg: She exhibits no edema.       Left lower leg: She exhibits no edema.  Neurological: She is alert and oriented to person, place, and time.  Skin: Skin is warm and dry.  Psychiatric: She has a normal mood and affect. Her behavior is normal.  Nursing note and vitals reviewed.  Assessment & Plan:  1: Chronic heart failure with reduced ejection fraction- - NYHA class II - euvolemic today - not weighing daily but does have scales. Instructed to begin weighing daily and calling for an overnight weight gain of >2 pounds or a weekly weight gain of >5 pounds - not adding salt to her food. Granddaughter does most of the shopping/ cooking and has been reading food labels. Reviewed the importance of closely following a 2036m sodium diet and written dietary information was given to her about this - drinking ~ 30 ounces of fluid daily - BNP 09/15/18 was 398.0 - patient reports receiving her flu vaccine for this season  2: HTN- - BP looks good today - [place]- BMP 09/22/18 reviewed and showed sodium 140, potassium 3.8, creatinine 2.62 and GFR 17  3: DM- - glucose at home was "good" - A1c 09/21/18 was 5.8%  Patient did not bring her medications nor a list. Each medication was verbally  reviewed with the patient and she was encouraged to bring the bottles to every visit to confirm accuracy of list.  Return in 2 months or sooner for any questions/problems before then.

## 2018-09-28 ENCOUNTER — Ambulatory Visit: Payer: Medicare Other | Attending: Family | Admitting: Family

## 2018-09-28 ENCOUNTER — Ambulatory Visit: Payer: Self-pay | Admitting: Pharmacist

## 2018-09-28 ENCOUNTER — Other Ambulatory Visit: Payer: Self-pay | Admitting: Pharmacist

## 2018-09-28 ENCOUNTER — Encounter: Payer: Self-pay | Admitting: Family

## 2018-09-28 VITALS — BP 136/72 | HR 69 | Resp 18 | Ht 66.0 in | Wt 119.1 lb

## 2018-09-28 DIAGNOSIS — E1151 Type 2 diabetes mellitus with diabetic peripheral angiopathy without gangrene: Secondary | ICD-10-CM | POA: Diagnosis not present

## 2018-09-28 DIAGNOSIS — I083 Combined rheumatic disorders of mitral, aortic and tricuspid valves: Secondary | ICD-10-CM | POA: Diagnosis not present

## 2018-09-28 DIAGNOSIS — I429 Cardiomyopathy, unspecified: Secondary | ICD-10-CM | POA: Diagnosis not present

## 2018-09-28 DIAGNOSIS — I5022 Chronic systolic (congestive) heart failure: Secondary | ICD-10-CM | POA: Diagnosis not present

## 2018-09-28 DIAGNOSIS — I110 Hypertensive heart disease with heart failure: Secondary | ICD-10-CM | POA: Insufficient documentation

## 2018-09-28 DIAGNOSIS — Z8249 Family history of ischemic heart disease and other diseases of the circulatory system: Secondary | ICD-10-CM | POA: Insufficient documentation

## 2018-09-28 DIAGNOSIS — Z95820 Peripheral vascular angioplasty status with implants and grafts: Secondary | ICD-10-CM | POA: Diagnosis not present

## 2018-09-28 DIAGNOSIS — E1122 Type 2 diabetes mellitus with diabetic chronic kidney disease: Secondary | ICD-10-CM | POA: Diagnosis not present

## 2018-09-28 DIAGNOSIS — I5042 Chronic combined systolic (congestive) and diastolic (congestive) heart failure: Secondary | ICD-10-CM | POA: Diagnosis not present

## 2018-09-28 DIAGNOSIS — N184 Chronic kidney disease, stage 4 (severe): Secondary | ICD-10-CM | POA: Diagnosis not present

## 2018-09-28 DIAGNOSIS — Z79899 Other long term (current) drug therapy: Secondary | ICD-10-CM | POA: Insufficient documentation

## 2018-09-28 DIAGNOSIS — K219 Gastro-esophageal reflux disease without esophagitis: Secondary | ICD-10-CM | POA: Insufficient documentation

## 2018-09-28 DIAGNOSIS — Z7982 Long term (current) use of aspirin: Secondary | ICD-10-CM | POA: Insufficient documentation

## 2018-09-28 DIAGNOSIS — E785 Hyperlipidemia, unspecified: Secondary | ICD-10-CM | POA: Insufficient documentation

## 2018-09-28 DIAGNOSIS — Z89412 Acquired absence of left great toe: Secondary | ICD-10-CM | POA: Diagnosis not present

## 2018-09-28 DIAGNOSIS — Z7984 Long term (current) use of oral hypoglycemic drugs: Secondary | ICD-10-CM | POA: Insufficient documentation

## 2018-09-28 DIAGNOSIS — M1990 Unspecified osteoarthritis, unspecified site: Secondary | ICD-10-CM | POA: Diagnosis not present

## 2018-09-28 DIAGNOSIS — D649 Anemia, unspecified: Secondary | ICD-10-CM | POA: Diagnosis not present

## 2018-09-28 DIAGNOSIS — R0602 Shortness of breath: Secondary | ICD-10-CM | POA: Diagnosis present

## 2018-09-28 DIAGNOSIS — I700 Atherosclerosis of aorta: Secondary | ICD-10-CM | POA: Diagnosis not present

## 2018-09-28 DIAGNOSIS — D500 Iron deficiency anemia secondary to blood loss (chronic): Secondary | ICD-10-CM | POA: Diagnosis not present

## 2018-09-28 DIAGNOSIS — I10 Essential (primary) hypertension: Secondary | ICD-10-CM

## 2018-09-28 DIAGNOSIS — I2510 Atherosclerotic heart disease of native coronary artery without angina pectoris: Secondary | ICD-10-CM | POA: Diagnosis not present

## 2018-09-28 DIAGNOSIS — I5032 Chronic diastolic (congestive) heart failure: Secondary | ICD-10-CM

## 2018-09-28 DIAGNOSIS — D631 Anemia in chronic kidney disease: Secondary | ICD-10-CM | POA: Diagnosis not present

## 2018-09-28 DIAGNOSIS — I130 Hypertensive heart and chronic kidney disease with heart failure and stage 1 through stage 4 chronic kidney disease, or unspecified chronic kidney disease: Secondary | ICD-10-CM | POA: Diagnosis not present

## 2018-09-28 DIAGNOSIS — Z8673 Personal history of transient ischemic attack (TIA), and cerebral infarction without residual deficits: Secondary | ICD-10-CM | POA: Diagnosis not present

## 2018-09-28 DIAGNOSIS — Z7902 Long term (current) use of antithrombotics/antiplatelets: Secondary | ICD-10-CM | POA: Diagnosis not present

## 2018-09-28 NOTE — Patient Instructions (Addendum)
Begin weighing daily and call for an overnight weight gain of > 2 pounds or a weekly weight gain of >5 pounds. 

## 2018-09-28 NOTE — Patient Outreach (Addendum)
[place] [facility]) Care Management  [place]  09/28/2018  [name] 04/14/1926 [id number]   Reason for referral: medication management  Unsuccessful telephone call attempt #1 to patient.   HIPAA compliant voicemail left requesting a return call with granddaughter, [name].  Plan:  I will make another outreach attempt to patient within 3-4 business days  Unsuccessful outreach letter send by [facility] RN CM  Regina Eck, PharmD, [place]  [phone]

## 2018-09-29 ENCOUNTER — Encounter: Payer: Self-pay | Admitting: Family

## 2018-09-29 ENCOUNTER — Other Ambulatory Visit: Payer: Self-pay | Admitting: *Deleted

## 2018-09-29 DIAGNOSIS — E119 Type 2 diabetes mellitus without complications: Secondary | ICD-10-CM | POA: Insufficient documentation

## 2018-09-29 NOTE — Patient Outreach (Signed)
[place] [facility]) Care Management  09/29/2018  DESTYNEE STRINGFELLOW September 12, 1926 [id number]   3rd attempt to contact member's contact person/granddaughter Lishel, unsuccessful.  HIPAA compliant voice message left.  If no call back by 11/5 will close case due to inability to establish contact.  Valente David, [place], MSN [name] [phone]

## 2018-09-30 DIAGNOSIS — I700 Atherosclerosis of aorta: Secondary | ICD-10-CM | POA: Diagnosis not present

## 2018-09-30 DIAGNOSIS — Z7902 Long term (current) use of antithrombotics/antiplatelets: Secondary | ICD-10-CM | POA: Diagnosis not present

## 2018-09-30 DIAGNOSIS — I130 Hypertensive heart and chronic kidney disease with heart failure and stage 1 through stage 4 chronic kidney disease, or unspecified chronic kidney disease: Secondary | ICD-10-CM | POA: Diagnosis not present

## 2018-09-30 DIAGNOSIS — Z7984 Long term (current) use of oral hypoglycemic drugs: Secondary | ICD-10-CM | POA: Diagnosis not present

## 2018-09-30 DIAGNOSIS — E785 Hyperlipidemia, unspecified: Secondary | ICD-10-CM | POA: Diagnosis not present

## 2018-09-30 DIAGNOSIS — E1151 Type 2 diabetes mellitus with diabetic peripheral angiopathy without gangrene: Secondary | ICD-10-CM | POA: Diagnosis not present

## 2018-09-30 DIAGNOSIS — K219 Gastro-esophageal reflux disease without esophagitis: Secondary | ICD-10-CM | POA: Diagnosis not present

## 2018-09-30 DIAGNOSIS — Z7982 Long term (current) use of aspirin: Secondary | ICD-10-CM | POA: Diagnosis not present

## 2018-09-30 DIAGNOSIS — I083 Combined rheumatic disorders of mitral, aortic and tricuspid valves: Secondary | ICD-10-CM | POA: Diagnosis not present

## 2018-09-30 DIAGNOSIS — I429 Cardiomyopathy, unspecified: Secondary | ICD-10-CM | POA: Diagnosis not present

## 2018-09-30 DIAGNOSIS — I2510 Atherosclerotic heart disease of native coronary artery without angina pectoris: Secondary | ICD-10-CM | POA: Diagnosis not present

## 2018-09-30 DIAGNOSIS — I5042 Chronic combined systolic (congestive) and diastolic (congestive) heart failure: Secondary | ICD-10-CM | POA: Diagnosis not present

## 2018-09-30 DIAGNOSIS — D500 Iron deficiency anemia secondary to blood loss (chronic): Secondary | ICD-10-CM | POA: Diagnosis not present

## 2018-09-30 DIAGNOSIS — D631 Anemia in chronic kidney disease: Secondary | ICD-10-CM | POA: Diagnosis not present

## 2018-09-30 DIAGNOSIS — Z89412 Acquired absence of left great toe: Secondary | ICD-10-CM | POA: Diagnosis not present

## 2018-09-30 DIAGNOSIS — E1122 Type 2 diabetes mellitus with diabetic chronic kidney disease: Secondary | ICD-10-CM | POA: Diagnosis not present

## 2018-09-30 DIAGNOSIS — M1990 Unspecified osteoarthritis, unspecified site: Secondary | ICD-10-CM | POA: Diagnosis not present

## 2018-09-30 DIAGNOSIS — N184 Chronic kidney disease, stage 4 (severe): Secondary | ICD-10-CM | POA: Diagnosis not present

## 2018-09-30 DIAGNOSIS — Z8673 Personal history of transient ischemic attack (TIA), and cerebral infarction without residual deficits: Secondary | ICD-10-CM | POA: Diagnosis not present

## 2018-09-30 DIAGNOSIS — Z95820 Peripheral vascular angioplasty status with implants and grafts: Secondary | ICD-10-CM | POA: Diagnosis not present

## 2018-10-03 ENCOUNTER — Other Ambulatory Visit: Payer: Self-pay | Admitting: Pharmacist

## 2018-10-03 ENCOUNTER — Ambulatory Visit: Payer: Self-pay | Admitting: Pharmacist

## 2018-10-03 NOTE — Patient Outreach (Signed)
[facility] [facility]) Care Management  [place]  10/03/2018  [name] 1926/06/15 [id number]   Reason for referral: medication managemnt  Unsuccessful telephone call attempt #2 to patient's granddaughter as requested. HIPAA compliant voicemail left requesting a return call  Plan:  I will make last outreach attempt to patient within 3-4 business days.  Regina Eck, PharmD, [place]  [phone]

## 2018-10-04 DIAGNOSIS — Z7902 Long term (current) use of antithrombotics/antiplatelets: Secondary | ICD-10-CM | POA: Diagnosis not present

## 2018-10-04 DIAGNOSIS — I700 Atherosclerosis of aorta: Secondary | ICD-10-CM | POA: Diagnosis not present

## 2018-10-04 DIAGNOSIS — M1990 Unspecified osteoarthritis, unspecified site: Secondary | ICD-10-CM | POA: Diagnosis not present

## 2018-10-04 DIAGNOSIS — Z95820 Peripheral vascular angioplasty status with implants and grafts: Secondary | ICD-10-CM | POA: Diagnosis not present

## 2018-10-04 DIAGNOSIS — D631 Anemia in chronic kidney disease: Secondary | ICD-10-CM | POA: Diagnosis not present

## 2018-10-04 DIAGNOSIS — Z89412 Acquired absence of left great toe: Secondary | ICD-10-CM | POA: Diagnosis not present

## 2018-10-04 DIAGNOSIS — R0602 Shortness of breath: Secondary | ICD-10-CM | POA: Diagnosis not present

## 2018-10-04 DIAGNOSIS — N184 Chronic kidney disease, stage 4 (severe): Secondary | ICD-10-CM | POA: Diagnosis not present

## 2018-10-04 DIAGNOSIS — I130 Hypertensive heart and chronic kidney disease with heart failure and stage 1 through stage 4 chronic kidney disease, or unspecified chronic kidney disease: Secondary | ICD-10-CM | POA: Diagnosis not present

## 2018-10-04 DIAGNOSIS — I2510 Atherosclerotic heart disease of native coronary artery without angina pectoris: Secondary | ICD-10-CM | POA: Diagnosis not present

## 2018-10-04 DIAGNOSIS — I083 Combined rheumatic disorders of mitral, aortic and tricuspid valves: Secondary | ICD-10-CM | POA: Diagnosis not present

## 2018-10-04 DIAGNOSIS — Z7982 Long term (current) use of aspirin: Secondary | ICD-10-CM | POA: Diagnosis not present

## 2018-10-04 DIAGNOSIS — E1122 Type 2 diabetes mellitus with diabetic chronic kidney disease: Secondary | ICD-10-CM | POA: Diagnosis not present

## 2018-10-04 DIAGNOSIS — D500 Iron deficiency anemia secondary to blood loss (chronic): Secondary | ICD-10-CM | POA: Diagnosis not present

## 2018-10-04 DIAGNOSIS — I429 Cardiomyopathy, unspecified: Secondary | ICD-10-CM | POA: Diagnosis not present

## 2018-10-04 DIAGNOSIS — E1151 Type 2 diabetes mellitus with diabetic peripheral angiopathy without gangrene: Secondary | ICD-10-CM | POA: Diagnosis not present

## 2018-10-04 DIAGNOSIS — I5042 Chronic combined systolic (congestive) and diastolic (congestive) heart failure: Secondary | ICD-10-CM | POA: Diagnosis not present

## 2018-10-04 DIAGNOSIS — Z7984 Long term (current) use of oral hypoglycemic drugs: Secondary | ICD-10-CM | POA: Diagnosis not present

## 2018-10-04 DIAGNOSIS — Z8673 Personal history of transient ischemic attack (TIA), and cerebral infarction without residual deficits: Secondary | ICD-10-CM | POA: Diagnosis not present

## 2018-10-04 DIAGNOSIS — E785 Hyperlipidemia, unspecified: Secondary | ICD-10-CM | POA: Diagnosis not present

## 2018-10-04 DIAGNOSIS — K219 Gastro-esophageal reflux disease without esophagitis: Secondary | ICD-10-CM | POA: Diagnosis not present

## 2018-10-05 ENCOUNTER — Telehealth: Payer: Self-pay

## 2018-10-05 ENCOUNTER — Other Ambulatory Visit: Payer: Self-pay | Admitting: *Deleted

## 2018-10-05 DIAGNOSIS — I2510 Atherosclerotic heart disease of native coronary artery without angina pectoris: Secondary | ICD-10-CM | POA: Diagnosis not present

## 2018-10-05 DIAGNOSIS — Z89412 Acquired absence of left great toe: Secondary | ICD-10-CM | POA: Diagnosis not present

## 2018-10-05 DIAGNOSIS — Z8673 Personal history of transient ischemic attack (TIA), and cerebral infarction without residual deficits: Secondary | ICD-10-CM | POA: Diagnosis not present

## 2018-10-05 DIAGNOSIS — D631 Anemia in chronic kidney disease: Secondary | ICD-10-CM | POA: Diagnosis not present

## 2018-10-05 DIAGNOSIS — K219 Gastro-esophageal reflux disease without esophagitis: Secondary | ICD-10-CM | POA: Diagnosis not present

## 2018-10-05 DIAGNOSIS — E785 Hyperlipidemia, unspecified: Secondary | ICD-10-CM | POA: Diagnosis not present

## 2018-10-05 DIAGNOSIS — Z95820 Peripheral vascular angioplasty status with implants and grafts: Secondary | ICD-10-CM | POA: Diagnosis not present

## 2018-10-05 DIAGNOSIS — Z7984 Long term (current) use of oral hypoglycemic drugs: Secondary | ICD-10-CM | POA: Diagnosis not present

## 2018-10-05 DIAGNOSIS — D500 Iron deficiency anemia secondary to blood loss (chronic): Secondary | ICD-10-CM | POA: Diagnosis not present

## 2018-10-05 DIAGNOSIS — I5042 Chronic combined systolic (congestive) and diastolic (congestive) heart failure: Secondary | ICD-10-CM | POA: Diagnosis not present

## 2018-10-05 DIAGNOSIS — I429 Cardiomyopathy, unspecified: Secondary | ICD-10-CM | POA: Diagnosis not present

## 2018-10-05 DIAGNOSIS — M1990 Unspecified osteoarthritis, unspecified site: Secondary | ICD-10-CM | POA: Diagnosis not present

## 2018-10-05 DIAGNOSIS — E1122 Type 2 diabetes mellitus with diabetic chronic kidney disease: Secondary | ICD-10-CM | POA: Diagnosis not present

## 2018-10-05 DIAGNOSIS — I700 Atherosclerosis of aorta: Secondary | ICD-10-CM | POA: Diagnosis not present

## 2018-10-05 DIAGNOSIS — I130 Hypertensive heart and chronic kidney disease with heart failure and stage 1 through stage 4 chronic kidney disease, or unspecified chronic kidney disease: Secondary | ICD-10-CM | POA: Diagnosis not present

## 2018-10-05 DIAGNOSIS — Z7982 Long term (current) use of aspirin: Secondary | ICD-10-CM | POA: Diagnosis not present

## 2018-10-05 DIAGNOSIS — Z7902 Long term (current) use of antithrombotics/antiplatelets: Secondary | ICD-10-CM | POA: Diagnosis not present

## 2018-10-05 DIAGNOSIS — N184 Chronic kidney disease, stage 4 (severe): Secondary | ICD-10-CM | POA: Diagnosis not present

## 2018-10-05 DIAGNOSIS — I083 Combined rheumatic disorders of mitral, aortic and tricuspid valves: Secondary | ICD-10-CM | POA: Diagnosis not present

## 2018-10-05 DIAGNOSIS — E1151 Type 2 diabetes mellitus with diabetic peripheral angiopathy without gangrene: Secondary | ICD-10-CM | POA: Diagnosis not present

## 2018-10-05 NOTE — Patient Outreach (Signed)
[name] Care Management  10/05/2018  [name] 27-Feb-1926 [id number]   No response from member/family after multiple outreach attempts and letter.  Will close case.  Noted that [facility] pharmacist will also make 3rd/final attempt to contact within the next week.  Will send nursing closure to member, will notify pharmacist of nursing closure.  Valente David, [place], MSN [place] [phone]

## 2018-10-05 NOTE — Telephone Encounter (Signed)
Palliative Medicine RN Note: Our office rec'd a call from Ms [name] stating that they would like to rescind the DNR order she had while at [facility]. No copy of DNR order or MOST form is on the chart. Our office will call her back with instructions to destroy the form and to mention that they want all aggressive care when/if she returns.  Alexa Skiff [name], RN, BSN, [facility] Palliative Medicine Team 10/05/2018 12:35 PM Office [phone]

## 2018-10-06 ENCOUNTER — Other Ambulatory Visit: Payer: Self-pay | Admitting: Pharmacist

## 2018-10-06 ENCOUNTER — Ambulatory Visit: Payer: Self-pay | Admitting: Pharmacist

## 2018-10-06 NOTE — Patient Outreach (Addendum)
[facility]) Care Management  [place]  10/06/2018  [name] 11/28/1926 [id number]   Reason for referral: medication management   Unsuccessful telephone call attempt #3 to patient.   HIPAA compliant voicemail left requesting a return call  Plan:  I will follow-up on 10th business day from opening case.  If no response from patient at this time, I will close [facility] case, I will close [facility] case at this time as I have been unable to establish and/or maintain contact with patient   Regina Eck, PharmD, [place]  [phone]

## 2018-10-11 ENCOUNTER — Other Ambulatory Visit: Payer: Self-pay | Admitting: Pharmacist

## 2018-10-11 ENCOUNTER — Ambulatory Visit: Payer: Self-pay | Admitting: Pharmacist

## 2018-10-11 DIAGNOSIS — Z8673 Personal history of transient ischemic attack (TIA), and cerebral infarction without residual deficits: Secondary | ICD-10-CM | POA: Diagnosis not present

## 2018-10-11 DIAGNOSIS — M1990 Unspecified osteoarthritis, unspecified site: Secondary | ICD-10-CM | POA: Diagnosis not present

## 2018-10-11 DIAGNOSIS — I700 Atherosclerosis of aorta: Secondary | ICD-10-CM | POA: Diagnosis not present

## 2018-10-11 DIAGNOSIS — I2510 Atherosclerotic heart disease of native coronary artery without angina pectoris: Secondary | ICD-10-CM | POA: Diagnosis not present

## 2018-10-11 DIAGNOSIS — I429 Cardiomyopathy, unspecified: Secondary | ICD-10-CM | POA: Diagnosis not present

## 2018-10-11 DIAGNOSIS — D631 Anemia in chronic kidney disease: Secondary | ICD-10-CM | POA: Diagnosis not present

## 2018-10-11 DIAGNOSIS — E1122 Type 2 diabetes mellitus with diabetic chronic kidney disease: Secondary | ICD-10-CM | POA: Diagnosis not present

## 2018-10-11 DIAGNOSIS — I083 Combined rheumatic disorders of mitral, aortic and tricuspid valves: Secondary | ICD-10-CM | POA: Diagnosis not present

## 2018-10-11 DIAGNOSIS — D500 Iron deficiency anemia secondary to blood loss (chronic): Secondary | ICD-10-CM | POA: Diagnosis not present

## 2018-10-11 DIAGNOSIS — K219 Gastro-esophageal reflux disease without esophagitis: Secondary | ICD-10-CM | POA: Diagnosis not present

## 2018-10-11 DIAGNOSIS — Z7902 Long term (current) use of antithrombotics/antiplatelets: Secondary | ICD-10-CM | POA: Diagnosis not present

## 2018-10-11 DIAGNOSIS — E1151 Type 2 diabetes mellitus with diabetic peripheral angiopathy without gangrene: Secondary | ICD-10-CM | POA: Diagnosis not present

## 2018-10-11 DIAGNOSIS — I130 Hypertensive heart and chronic kidney disease with heart failure and stage 1 through stage 4 chronic kidney disease, or unspecified chronic kidney disease: Secondary | ICD-10-CM | POA: Diagnosis not present

## 2018-10-11 DIAGNOSIS — Z7984 Long term (current) use of oral hypoglycemic drugs: Secondary | ICD-10-CM | POA: Diagnosis not present

## 2018-10-11 DIAGNOSIS — E785 Hyperlipidemia, unspecified: Secondary | ICD-10-CM | POA: Diagnosis not present

## 2018-10-11 DIAGNOSIS — I5042 Chronic combined systolic (congestive) and diastolic (congestive) heart failure: Secondary | ICD-10-CM | POA: Diagnosis not present

## 2018-10-11 DIAGNOSIS — Z7982 Long term (current) use of aspirin: Secondary | ICD-10-CM | POA: Diagnosis not present

## 2018-10-11 DIAGNOSIS — Z89412 Acquired absence of left great toe: Secondary | ICD-10-CM | POA: Diagnosis not present

## 2018-10-11 DIAGNOSIS — N184 Chronic kidney disease, stage 4 (severe): Secondary | ICD-10-CM | POA: Diagnosis not present

## 2018-10-11 DIAGNOSIS — Z95820 Peripheral vascular angioplasty status with implants and grafts: Secondary | ICD-10-CM | POA: Diagnosis not present

## 2018-10-11 NOTE — Patient Outreach (Signed)
[place] [facility]) Care Management [place]  10/11/2018  [name] 1926/07/15 [id number]  Reason for referral: medication management  [facility] pharmacy case is being closed due to the following reasons:  We have been unable to establish and/or maintain contact with the patient.  Patient has been provided [facility] CM contact information if assistance needed in the future.    Thank you for allowing [facility] pharmacy to be involved in this patient's care.     Regina Eck, PharmD, [place]  [phone]

## 2018-10-12 ENCOUNTER — Inpatient Hospital Stay: Payer: Medicare Other

## 2018-10-12 ENCOUNTER — Inpatient Hospital Stay: Payer: Medicare Other | Attending: Oncology

## 2018-10-12 VITALS — BP 108/59 | HR 77 | Resp 17

## 2018-10-12 DIAGNOSIS — R627 Adult failure to thrive: Secondary | ICD-10-CM | POA: Diagnosis not present

## 2018-10-12 DIAGNOSIS — R0602 Shortness of breath: Secondary | ICD-10-CM | POA: Diagnosis not present

## 2018-10-12 DIAGNOSIS — I110 Hypertensive heart disease with heart failure: Secondary | ICD-10-CM | POA: Diagnosis not present

## 2018-10-12 DIAGNOSIS — E1151 Type 2 diabetes mellitus with diabetic peripheral angiopathy without gangrene: Secondary | ICD-10-CM | POA: Diagnosis not present

## 2018-10-12 DIAGNOSIS — M1990 Unspecified osteoarthritis, unspecified site: Secondary | ICD-10-CM | POA: Diagnosis not present

## 2018-10-12 DIAGNOSIS — N179 Acute kidney failure, unspecified: Secondary | ICD-10-CM | POA: Diagnosis present

## 2018-10-12 DIAGNOSIS — E1122 Type 2 diabetes mellitus with diabetic chronic kidney disease: Secondary | ICD-10-CM | POA: Diagnosis not present

## 2018-10-12 DIAGNOSIS — Z66 Do not resuscitate: Secondary | ICD-10-CM | POA: Diagnosis present

## 2018-10-12 DIAGNOSIS — I509 Heart failure, unspecified: Secondary | ICD-10-CM | POA: Diagnosis not present

## 2018-10-12 DIAGNOSIS — Z9981 Dependence on supplemental oxygen: Secondary | ICD-10-CM

## 2018-10-12 DIAGNOSIS — Z8673 Personal history of transient ischemic attack (TIA), and cerebral infarction without residual deficits: Secondary | ICD-10-CM | POA: Diagnosis not present

## 2018-10-12 DIAGNOSIS — Z79899 Other long term (current) drug therapy: Secondary | ICD-10-CM

## 2018-10-12 DIAGNOSIS — D631 Anemia in chronic kidney disease: Secondary | ICD-10-CM

## 2018-10-12 DIAGNOSIS — I5023 Acute on chronic systolic (congestive) heart failure: Secondary | ICD-10-CM | POA: Diagnosis not present

## 2018-10-12 DIAGNOSIS — Z7902 Long term (current) use of antithrombotics/antiplatelets: Secondary | ICD-10-CM

## 2018-10-12 DIAGNOSIS — I130 Hypertensive heart and chronic kidney disease with heart failure and stage 1 through stage 4 chronic kidney disease, or unspecified chronic kidney disease: Principal | ICD-10-CM | POA: Diagnosis present

## 2018-10-12 DIAGNOSIS — Z89412 Acquired absence of left great toe: Secondary | ICD-10-CM

## 2018-10-12 DIAGNOSIS — D509 Iron deficiency anemia, unspecified: Secondary | ICD-10-CM | POA: Diagnosis present

## 2018-10-12 DIAGNOSIS — Z7984 Long term (current) use of oral hypoglycemic drugs: Secondary | ICD-10-CM | POA: Diagnosis not present

## 2018-10-12 DIAGNOSIS — N189 Chronic kidney disease, unspecified: Principal | ICD-10-CM

## 2018-10-12 DIAGNOSIS — Z95820 Peripheral vascular angioplasty status with implants and grafts: Secondary | ICD-10-CM | POA: Diagnosis not present

## 2018-10-12 DIAGNOSIS — Z7982 Long term (current) use of aspirin: Secondary | ICD-10-CM

## 2018-10-12 DIAGNOSIS — I2510 Atherosclerotic heart disease of native coronary artery without angina pectoris: Secondary | ICD-10-CM | POA: Diagnosis not present

## 2018-10-12 DIAGNOSIS — F419 Anxiety disorder, unspecified: Secondary | ICD-10-CM | POA: Diagnosis present

## 2018-10-12 DIAGNOSIS — F0390 Unspecified dementia without behavioral disturbance: Secondary | ICD-10-CM | POA: Diagnosis present

## 2018-10-12 DIAGNOSIS — E876 Hypokalemia: Secondary | ICD-10-CM | POA: Diagnosis present

## 2018-10-12 DIAGNOSIS — I083 Combined rheumatic disorders of mitral, aortic and tricuspid valves: Secondary | ICD-10-CM | POA: Diagnosis not present

## 2018-10-12 DIAGNOSIS — E785 Hyperlipidemia, unspecified: Secondary | ICD-10-CM | POA: Diagnosis not present

## 2018-10-12 DIAGNOSIS — I5042 Chronic combined systolic (congestive) and diastolic (congestive) heart failure: Secondary | ICD-10-CM | POA: Diagnosis not present

## 2018-10-12 DIAGNOSIS — R531 Weakness: Secondary | ICD-10-CM | POA: Diagnosis not present

## 2018-10-12 DIAGNOSIS — D500 Iron deficiency anemia secondary to blood loss (chronic): Secondary | ICD-10-CM | POA: Diagnosis not present

## 2018-10-12 DIAGNOSIS — R040 Epistaxis: Secondary | ICD-10-CM | POA: Diagnosis not present

## 2018-10-12 DIAGNOSIS — N184 Chronic kidney disease, stage 4 (severe): Secondary | ICD-10-CM | POA: Diagnosis present

## 2018-10-12 DIAGNOSIS — I429 Cardiomyopathy, unspecified: Secondary | ICD-10-CM | POA: Diagnosis not present

## 2018-10-12 DIAGNOSIS — K219 Gastro-esophageal reflux disease without esophagitis: Secondary | ICD-10-CM | POA: Diagnosis not present

## 2018-10-12 DIAGNOSIS — I700 Atherosclerosis of aorta: Secondary | ICD-10-CM | POA: Diagnosis not present

## 2018-10-12 DIAGNOSIS — D649 Anemia, unspecified: Secondary | ICD-10-CM

## 2018-10-12 LAB — CBC WITH DIFFERENTIAL (CANCER CENTER ONLY)
ABS IMMATURE GRANULOCYTES: 0.04 10*3/uL (ref 0.00–0.07)
BASOS PCT: 0 %
Basophils Absolute: 0 10*3/uL (ref 0.0–0.1)
Eosinophils Absolute: 0.1 10*3/uL (ref 0.0–0.5)
Eosinophils Relative: 1 %
HCT: 32.6 % — ABNORMAL LOW (ref 36.0–46.0)
Hemoglobin: 10.2 g/dL — ABNORMAL LOW (ref 12.0–15.0)
Immature Granulocytes: 0 %
Lymphocytes Relative: 13 %
Lymphs Abs: 1.3 10*3/uL (ref 0.7–4.0)
MCH: 25.5 pg — AB (ref 26.0–34.0)
MCHC: 31.3 g/dL (ref 30.0–36.0)
MCV: 81.5 fL (ref 80.0–100.0)
MONO ABS: 1.1 10*3/uL — AB (ref 0.1–1.0)
Monocytes Relative: 11 %
NEUTROS ABS: 7.3 10*3/uL (ref 1.7–7.7)
Neutrophils Relative %: 75 %
PLATELETS: 231 10*3/uL (ref 150–400)
RBC: 4 MIL/uL (ref 3.87–5.11)
RDW: 18.4 % — ABNORMAL HIGH (ref 11.5–15.5)
WBC Count: 9.8 10*3/uL (ref 4.0–10.5)
nRBC: 0 % (ref 0.0–0.2)

## 2018-10-12 MED ORDER — DARBEPOETIN ALFA 300 MCG/0.6ML IJ SOSY
PREFILLED_SYRINGE | INTRAMUSCULAR | Status: AC
  Filled 2018-10-12: qty 0.6

## 2018-10-12 MED ORDER — DARBEPOETIN ALFA 300 MCG/0.6ML IJ SOSY
300.0000 ug | PREFILLED_SYRINGE | Freq: Once | INTRAMUSCULAR | Status: AC
  Administered 2018-10-12: 300 ug via SUBCUTANEOUS

## 2018-10-13 DIAGNOSIS — I429 Cardiomyopathy, unspecified: Secondary | ICD-10-CM | POA: Diagnosis not present

## 2018-10-13 DIAGNOSIS — Z7982 Long term (current) use of aspirin: Secondary | ICD-10-CM | POA: Diagnosis not present

## 2018-10-13 DIAGNOSIS — E785 Hyperlipidemia, unspecified: Secondary | ICD-10-CM | POA: Diagnosis not present

## 2018-10-13 DIAGNOSIS — D500 Iron deficiency anemia secondary to blood loss (chronic): Secondary | ICD-10-CM | POA: Diagnosis not present

## 2018-10-13 DIAGNOSIS — E1151 Type 2 diabetes mellitus with diabetic peripheral angiopathy without gangrene: Secondary | ICD-10-CM | POA: Diagnosis not present

## 2018-10-13 DIAGNOSIS — Z89412 Acquired absence of left great toe: Secondary | ICD-10-CM | POA: Diagnosis not present

## 2018-10-13 DIAGNOSIS — Z7984 Long term (current) use of oral hypoglycemic drugs: Secondary | ICD-10-CM | POA: Diagnosis not present

## 2018-10-13 DIAGNOSIS — Z7902 Long term (current) use of antithrombotics/antiplatelets: Secondary | ICD-10-CM | POA: Diagnosis not present

## 2018-10-13 DIAGNOSIS — I083 Combined rheumatic disorders of mitral, aortic and tricuspid valves: Secondary | ICD-10-CM | POA: Diagnosis not present

## 2018-10-13 DIAGNOSIS — I700 Atherosclerosis of aorta: Secondary | ICD-10-CM | POA: Diagnosis not present

## 2018-10-13 DIAGNOSIS — Z8673 Personal history of transient ischemic attack (TIA), and cerebral infarction without residual deficits: Secondary | ICD-10-CM | POA: Diagnosis not present

## 2018-10-13 DIAGNOSIS — I5042 Chronic combined systolic (congestive) and diastolic (congestive) heart failure: Secondary | ICD-10-CM | POA: Diagnosis not present

## 2018-10-13 DIAGNOSIS — M1990 Unspecified osteoarthritis, unspecified site: Secondary | ICD-10-CM | POA: Diagnosis not present

## 2018-10-13 DIAGNOSIS — K219 Gastro-esophageal reflux disease without esophagitis: Secondary | ICD-10-CM | POA: Diagnosis not present

## 2018-10-13 DIAGNOSIS — I2510 Atherosclerotic heart disease of native coronary artery without angina pectoris: Secondary | ICD-10-CM | POA: Diagnosis not present

## 2018-10-13 DIAGNOSIS — D631 Anemia in chronic kidney disease: Secondary | ICD-10-CM | POA: Diagnosis not present

## 2018-10-13 DIAGNOSIS — I130 Hypertensive heart and chronic kidney disease with heart failure and stage 1 through stage 4 chronic kidney disease, or unspecified chronic kidney disease: Secondary | ICD-10-CM | POA: Diagnosis not present

## 2018-10-13 DIAGNOSIS — E1122 Type 2 diabetes mellitus with diabetic chronic kidney disease: Secondary | ICD-10-CM | POA: Diagnosis not present

## 2018-10-13 DIAGNOSIS — N184 Chronic kidney disease, stage 4 (severe): Secondary | ICD-10-CM | POA: Diagnosis not present

## 2018-10-13 DIAGNOSIS — Z95820 Peripheral vascular angioplasty status with implants and grafts: Secondary | ICD-10-CM | POA: Diagnosis not present

## 2018-10-15 ENCOUNTER — Other Ambulatory Visit: Payer: Self-pay

## 2018-10-15 ENCOUNTER — Emergency Department (HOSPITAL_COMMUNITY): Payer: Medicare Other

## 2018-10-15 ENCOUNTER — Inpatient Hospital Stay (HOSPITAL_COMMUNITY)
Admission: EM | Admit: 2018-10-15 | Discharge: 2018-10-18 | DRG: 291 | Disposition: A | Discharge status: Home Health | Payer: Medicare Other | Attending: Internal Medicine | Admitting: Internal Medicine

## 2018-10-15 ENCOUNTER — Encounter (HOSPITAL_COMMUNITY): Payer: Self-pay | Admitting: Emergency Medicine

## 2018-10-15 DIAGNOSIS — D631 Anemia in chronic kidney disease: Secondary | ICD-10-CM | POA: Diagnosis present

## 2018-10-15 DIAGNOSIS — Z66 Do not resuscitate: Secondary | ICD-10-CM | POA: Diagnosis present

## 2018-10-15 DIAGNOSIS — E1151 Type 2 diabetes mellitus with diabetic peripheral angiopathy without gangrene: Secondary | ICD-10-CM | POA: Diagnosis present

## 2018-10-15 DIAGNOSIS — F419 Anxiety disorder, unspecified: Secondary | ICD-10-CM | POA: Diagnosis present

## 2018-10-15 DIAGNOSIS — Z7982 Long term (current) use of aspirin: Secondary | ICD-10-CM | POA: Diagnosis not present

## 2018-10-15 DIAGNOSIS — Z89412 Acquired absence of left great toe: Secondary | ICD-10-CM | POA: Diagnosis not present

## 2018-10-15 DIAGNOSIS — Z9981 Dependence on supplemental oxygen: Secondary | ICD-10-CM | POA: Diagnosis not present

## 2018-10-15 DIAGNOSIS — I10 Essential (primary) hypertension: Secondary | ICD-10-CM | POA: Diagnosis not present

## 2018-10-15 DIAGNOSIS — N189 Chronic kidney disease, unspecified: Secondary | ICD-10-CM | POA: Diagnosis not present

## 2018-10-15 DIAGNOSIS — R040 Epistaxis: Secondary | ICD-10-CM

## 2018-10-15 DIAGNOSIS — E876 Hypokalemia: Secondary | ICD-10-CM | POA: Diagnosis present

## 2018-10-15 DIAGNOSIS — F0390 Unspecified dementia without behavioral disturbance: Secondary | ICD-10-CM | POA: Diagnosis present

## 2018-10-15 DIAGNOSIS — Z7902 Long term (current) use of antithrombotics/antiplatelets: Secondary | ICD-10-CM | POA: Diagnosis not present

## 2018-10-15 DIAGNOSIS — R0989 Other specified symptoms and signs involving the circulatory and respiratory systems: Secondary | ICD-10-CM | POA: Diagnosis not present

## 2018-10-15 DIAGNOSIS — L97222 Non-pressure chronic ulcer of left calf with fat layer exposed: Secondary | ICD-10-CM | POA: Diagnosis not present

## 2018-10-15 DIAGNOSIS — Z79899 Other long term (current) drug therapy: Secondary | ICD-10-CM | POA: Diagnosis not present

## 2018-10-15 DIAGNOSIS — Z7984 Long term (current) use of oral hypoglycemic drugs: Secondary | ICD-10-CM | POA: Diagnosis not present

## 2018-10-15 DIAGNOSIS — R0602 Shortness of breath: Secondary | ICD-10-CM | POA: Diagnosis present

## 2018-10-15 DIAGNOSIS — I509 Heart failure, unspecified: Secondary | ICD-10-CM

## 2018-10-15 DIAGNOSIS — K219 Gastro-esophageal reflux disease without esophagitis: Secondary | ICD-10-CM | POA: Diagnosis present

## 2018-10-15 DIAGNOSIS — E1122 Type 2 diabetes mellitus with diabetic chronic kidney disease: Secondary | ICD-10-CM

## 2018-10-15 DIAGNOSIS — I110 Hypertensive heart disease with heart failure: Secondary | ICD-10-CM | POA: Diagnosis not present

## 2018-10-15 DIAGNOSIS — R531 Weakness: Secondary | ICD-10-CM | POA: Diagnosis not present

## 2018-10-15 DIAGNOSIS — N184 Chronic kidney disease, stage 4 (severe): Secondary | ICD-10-CM | POA: Diagnosis not present

## 2018-10-15 DIAGNOSIS — R627 Adult failure to thrive: Secondary | ICD-10-CM | POA: Diagnosis present

## 2018-10-15 DIAGNOSIS — I5023 Acute on chronic systolic (congestive) heart failure: Secondary | ICD-10-CM

## 2018-10-15 DIAGNOSIS — I130 Hypertensive heart and chronic kidney disease with heart failure and stage 1 through stage 4 chronic kidney disease, or unspecified chronic kidney disease: Secondary | ICD-10-CM | POA: Diagnosis present

## 2018-10-15 DIAGNOSIS — E119 Type 2 diabetes mellitus without complications: Secondary | ICD-10-CM

## 2018-10-15 DIAGNOSIS — N179 Acute kidney failure, unspecified: Secondary | ICD-10-CM | POA: Diagnosis not present

## 2018-10-15 DIAGNOSIS — I2510 Atherosclerotic heart disease of native coronary artery without angina pectoris: Secondary | ICD-10-CM

## 2018-10-15 DIAGNOSIS — D509 Iron deficiency anemia, unspecified: Secondary | ICD-10-CM | POA: Diagnosis present

## 2018-10-15 DIAGNOSIS — E785 Hyperlipidemia, unspecified: Secondary | ICD-10-CM | POA: Diagnosis present

## 2018-10-15 LAB — URINALYSIS, ROUTINE W REFLEX MICROSCOPIC
Bilirubin Urine: NEGATIVE
Glucose, UA: NEGATIVE mg/dL
Hgb urine dipstick: NEGATIVE
Ketones, ur: NEGATIVE mg/dL
Nitrite: NEGATIVE
Protein, ur: NEGATIVE mg/dL
Specific Gravity, Urine: 1.01 (ref 1.005–1.030)
pH: 6 (ref 5.0–8.0)

## 2018-10-15 LAB — CBC WITH DIFFERENTIAL/PLATELET
ABS IMMATURE GRANULOCYTES: 0.01 10*3/uL (ref 0.00–0.07)
BASOS ABS: 0 10*3/uL (ref 0.0–0.1)
Basophils Relative: 0 %
EOS PCT: 2 %
Eosinophils Absolute: 0.1 10*3/uL (ref 0.0–0.5)
HCT: 33 % — ABNORMAL LOW (ref 36.0–46.0)
Hemoglobin: 10.2 g/dL — ABNORMAL LOW (ref 12.0–15.0)
IMMATURE GRANULOCYTES: 0 %
LYMPHS PCT: 26 %
Lymphs Abs: 1.6 10*3/uL (ref 0.7–4.0)
MCH: 24.9 pg — AB (ref 26.0–34.0)
MCHC: 30.9 g/dL (ref 30.0–36.0)
MCV: 80.7 fL (ref 80.0–100.0)
Monocytes Absolute: 0.7 10*3/uL (ref 0.1–1.0)
Monocytes Relative: 11 %
NEUTROS PCT: 61 %
NRBC: 0.3 % — AB (ref 0.0–0.2)
Neutro Abs: 3.8 10*3/uL (ref 1.7–7.7)
Platelets: 292 10*3/uL (ref 150–400)
RBC: 4.09 MIL/uL (ref 3.87–5.11)
RDW: 18 % — AB (ref 11.5–15.5)
WBC: 6.2 10*3/uL (ref 4.0–10.5)

## 2018-10-15 LAB — COMPREHENSIVE METABOLIC PANEL
ALT: 18 U/L (ref 0–44)
AST: 29 U/L (ref 15–41)
Albumin: 2.9 g/dL — ABNORMAL LOW (ref 3.5–5.0)
Alkaline Phosphatase: 65 U/L (ref 38–126)
Anion gap: 10 (ref 5–15)
BUN: 46 mg/dL — ABNORMAL HIGH (ref 8–23)
CHLORIDE: 109 mmol/L (ref 98–111)
CO2: 23 mmol/L (ref 22–32)
CREATININE: 3.12 mg/dL — AB (ref 0.44–1.00)
Calcium: 8.9 mg/dL (ref 8.9–10.3)
GFR calc non Af Amer: 12 mL/min — ABNORMAL LOW (ref >60)
GFR, EST AFRICAN AMERICAN: 14 mL/min — AB (ref >60)
Glucose, Bld: 142 mg/dL — ABNORMAL HIGH (ref 70–99)
POTASSIUM: 4.4 mmol/L (ref 3.5–5.1)
SODIUM: 142 mmol/L (ref 135–145)
Total Bilirubin: 0.8 mg/dL (ref 0.3–1.2)
Total Protein: 8.5 g/dL — ABNORMAL HIGH (ref 6.5–8.1)

## 2018-10-15 LAB — BRAIN NATRIURETIC PEPTIDE: B Natriuretic Peptide: 2046.5 pg/mL — ABNORMAL HIGH (ref 0.0–100.0)

## 2018-10-15 LAB — TROPONIN I
Troponin I: 0.03 ng/mL (ref <0.03)
Troponin I: 0.03 ng/mL (ref <0.03)
Troponin I: 0.03 ng/mL (ref <0.03)

## 2018-10-15 LAB — MRSA PCR SCREENING: MRSA by PCR: NEGATIVE

## 2018-10-15 LAB — GLUCOSE, CAPILLARY: Glucose-Capillary: 133 mg/dL — ABNORMAL HIGH (ref 70–99)

## 2018-10-15 MED ORDER — ACETAMINOPHEN 500 MG PO TABS
500.0000 mg | ORAL_TABLET | Freq: Every day | ORAL | Status: DC
  Administered 2018-10-16 – 2018-10-18 (×3): 500 mg via ORAL
  Filled 2018-10-15 (×4): qty 1

## 2018-10-15 MED ORDER — CARVEDILOL 12.5 MG PO TABS
12.5000 mg | ORAL_TABLET | Freq: Two times a day (BID) | ORAL | Status: DC
  Administered 2018-10-15 – 2018-10-18 (×6): 12.5 mg via ORAL
  Filled 2018-10-15 (×7): qty 1

## 2018-10-15 MED ORDER — ACETAMINOPHEN 325 MG PO TABS: 650.0000 mg | ORAL_TABLET | ORAL | Status: DC | PRN

## 2018-10-15 MED ORDER — CALCIUM CARBONATE-VITAMIN D 500-200 MG-UNIT PO TABS
1.0000 | ORAL_TABLET | Freq: Every day | ORAL | Status: DC
  Administered 2018-10-16 – 2018-10-18 (×3): 1 via ORAL
  Filled 2018-10-15 (×4): qty 1

## 2018-10-15 MED ORDER — CLOPIDOGREL BISULFATE 75 MG PO TABS
75.0000 mg | ORAL_TABLET | Freq: Every day | ORAL | Status: DC
  Administered 2018-10-16 – 2018-10-18 (×3): 75 mg via ORAL
  Filled 2018-10-15 (×4): qty 1

## 2018-10-15 MED ORDER — SODIUM CHLORIDE 0.9 % IV SOLN: 250.0000 mL | INTRAVENOUS | Status: DC | PRN

## 2018-10-15 MED ORDER — PANTOPRAZOLE SODIUM 20 MG PO TBEC
20.0000 mg | DELAYED_RELEASE_TABLET | Freq: Every day | ORAL | Status: DC
  Administered 2018-10-15 – 2018-10-18 (×4): 20 mg via ORAL
  Filled 2018-10-15 (×5): qty 1

## 2018-10-15 MED ORDER — ATORVASTATIN CALCIUM 20 MG PO TABS
10.0000 mg | ORAL_TABLET | Freq: Every day | ORAL | Status: DC
  Administered 2018-10-15 – 2018-10-18 (×4): 10 mg via ORAL
  Filled 2018-10-15 (×5): qty 1

## 2018-10-15 MED ORDER — FUROSEMIDE 10 MG/ML IJ SOLN
40.0000 mg | Freq: Two times a day (BID) | INTRAMUSCULAR | Status: DC
  Administered 2018-10-15 – 2018-10-16 (×2): 40 mg via INTRAVENOUS
  Filled 2018-10-15 (×2): qty 4

## 2018-10-15 MED ORDER — FUROSEMIDE 10 MG/ML IJ SOLN
40.0000 mg | Freq: Once | INTRAMUSCULAR | Status: AC
  Administered 2018-10-15: 40 mg via INTRAVENOUS
  Filled 2018-10-15: qty 4

## 2018-10-15 MED ORDER — SILVER NITRATE-POT NITRATE 75-25 % EX MISC
1.0000 | Freq: Once | CUTANEOUS | Status: AC
  Administered 2018-10-15: 1 via TOPICAL
  Filled 2018-10-15: qty 1

## 2018-10-15 MED ORDER — INSULIN ASPART 100 UNIT/ML ~~LOC~~ SOLN: 0.0000 [IU] | Freq: Every day | SUBCUTANEOUS | Status: DC

## 2018-10-15 MED ORDER — HEPARIN SODIUM (PORCINE) 5000 UNIT/ML IJ SOLN
5000.0000 [IU] | Freq: Three times a day (TID) | INTRAMUSCULAR | Status: DC
  Administered 2018-10-16 – 2018-10-18 (×6): 5000 [IU] via SUBCUTANEOUS
  Filled 2018-10-15 (×6): qty 1

## 2018-10-15 MED ORDER — ONDANSETRON HCL 4 MG/2ML IJ SOLN: 4.0000 mg | Freq: Four times a day (QID) | INTRAMUSCULAR | Status: DC | PRN

## 2018-10-15 MED ORDER — HYDROXYZINE HCL 10 MG PO TABS
10.0000 mg | ORAL_TABLET | Freq: Three times a day (TID) | ORAL | Status: DC | PRN
  Administered 2018-10-18: 10 mg via ORAL
  Filled 2018-10-15: qty 1

## 2018-10-15 MED ORDER — ASPIRIN EC 81 MG PO TBEC
81.0000 mg | DELAYED_RELEASE_TABLET | Freq: Every day | ORAL | Status: DC
  Administered 2018-10-16 – 2018-10-18 (×3): 81 mg via ORAL
  Filled 2018-10-15 (×5): qty 1

## 2018-10-15 MED ORDER — INSULIN ASPART 100 UNIT/ML ~~LOC~~ SOLN
0.0000 [IU] | Freq: Three times a day (TID) | SUBCUTANEOUS | Status: DC
  Administered 2018-10-16 – 2018-10-18 (×4): 1 [IU] via SUBCUTANEOUS

## 2018-10-15 MED ORDER — SODIUM CHLORIDE 0.9% FLUSH: 3.0000 mL | INTRAVENOUS | Status: DC | PRN

## 2018-10-15 MED ORDER — POLYETHYLENE GLYCOL 3350 17 G PO PACK: 17.0000 g | PACK | Freq: Every day | ORAL | Status: DC | PRN

## 2018-10-15 MED ORDER — SODIUM CHLORIDE 0.9% FLUSH
3.0000 mL | Freq: Two times a day (BID) | INTRAVENOUS | Status: DC
  Administered 2018-10-15 – 2018-10-18 (×6): 3 mL via INTRAVENOUS

## 2018-10-15 NOTE — ED Notes (Signed)
Patient transported to x-ray. ?

## 2018-10-15 NOTE — Plan of Care (Signed)
  Problem: Nutrition: Goal: Adequate nutrition will be maintained Outcome: Completed/Met   Problem: Coping: Goal: Level of anxiety will decrease Outcome: Completed/Met   Problem: Pain Managment: Goal: General experience of comfort will improve Outcome: Completed/Met   

## 2018-10-15 NOTE — Progress Notes (Signed)
Pt will need to have a Humidified bottle for her O2 at home to help prevent further nose bleeds, Thanks Arvella Nigh RN.

## 2018-10-15 NOTE — ED Notes (Signed)
External female catheter placed and patient made aware of need for urine sample.

## 2018-10-15 NOTE — ED Triage Notes (Signed)
Patient to ED c/o lightheadedness, fatigue, and epistaxis yesterday. Patient reports nosebleed stopped last night, but it was a lot of blood. Patient on Plavix. R nostril has dried blood/clot, no bleeding at this time. A&O x 4.

## 2018-10-15 NOTE — Progress Notes (Signed)
Report received from Nurse K.

## 2018-10-15 NOTE — H&P (Addendum)
History and Physical    TANGY DROZDOWSKI GYI:[id number] DOB: October 02, 1926 DOA: 10/15/2018  PCP: Lucianne Lei, MD Consultants:  none Patient coming from: Home- lives with son  Chief Complaint: dizziness, weakness  HPI: [name] is a 84 y.o. female with medical history significant for systolic CHF, DM2, CKD4,  who presented to the ED today with c/o generalized weakness x 2 days.  Her granddaughter states pt has not been able to get around, has been breathing fast similar to prior exacerbations of CHF. Had nosebleed which prompted her to come in to ED. Silver nitrate was used and bleeding stopped. Patient states that she has been compliant with her Lasix as well as sodium and fluid restriction.  Granddaughter states that she got patient a scale 2 days ago but is concerned is not accurate because of rapidly fluctuating values when different family members try it as well.  She denies any lower extremity edema or abdominal bloating.  Patient denies orthopnea, PND.  She has had no chest pain or palpitations.  She has had a cough that has been nonproductive.  No fevers or chills.  She was admitted to the hospital twice last month and sent home on home oxygen.  Patient has been increasing her use of oxygen due to her symptoms which was felt to have caused her nosebleed most likely.  ED Course: Satting OK on her baseline of 2-3L. RR 25, up to 30s with minimal movement. Bibasilar rales. BNP 2046.5; values in the past have been 300 -700.  Chest x-ray showed persistent bilateral pulmonary edema.  Milligrams IV.  Review of Systems: As per HPI; otherwise review of systems reviewed and negative.   Ambulatory Status:  Ambulates without assistance  Past Medical History:  Diagnosis Date  . ACS (acute coronary syndrome) ([place]) 08/2018  . Anemia    family unsure of this history  . Arthritis   . CHF (congestive heart failure) ([place])   . Chronic ulcer of calf (HCC)    Left  . Constipation   . Diabetes  mellitus without complication ([place])   . Dyslipidemia   . Dyspnea   . GERD (gastroesophageal reflux disease)   . Hyperlipidemia   . Hypertension   . Peripheral vascular disease [facility])     Past Surgical History:  Procedure Laterality Date  . ABDOMINAL AORTOGRAM W/LOWER EXTREMITY Left 12/27/2017   Procedure: ABDOMINAL AORTOGRAM W/LOWER EXTREMITY;  Surgeon: Algernon Huxley, MD;  Location: [place] [facility];  Service: Cardiovascular;  Laterality: Left;  . ABDOMINAL HYSTERECTOMY    . AMPUTATION TOE Left 12/23/2017   Procedure: AMPUTATION TOE LEFT GREAT TOE;  Surgeon: Sharlotte Alamo, DPM;  Location: [facility];  Service: Podiatry;  Laterality: Left;  . ENDARTERECTOMY FEMORAL Left 09/08/2017   Procedure: ENDARTERECTOMY FEMORAL WITH CORMATRIX PATCH;  Surgeon: Algernon Huxley, MD;  Location: [facility] OR[facility];  Service: Vascular;  Laterality: Left;  . EYE SURGERY Bilateral    Cataract Extraction with IOL  . LOWER EXTREMITY ANGIOGRAPHY Left 07/22/2017   Procedure: Lower Extremity Angiography;  Surgeon: Algernon Huxley, MD;  Location: [place] [facility];  Service: Cardiovascular;  Laterality: Left;  . LOWER EXTREMITY ANGIOGRAPHY Left 08/16/2017   Procedure: Lower Extremity Angiography;  Surgeon: Algernon Huxley, MD;  Location: [place] [facility];  Service: Cardiovascular;  Laterality: Left;  Marland Kitchen MULTIPLE TOOTH EXTRACTIONS      Social History   Socioeconomic History  . Marital status: Legally Separated    Spouse name: Not on file  .  Number of children: Not on file  . Years of education: Not on file  . Highest education level: Not on file  Occupational History  . Not on file  Social Needs  . Financial resource strain: Not on file  . Food insecurity:    Worry: Not on file    Inability: Not on file  . Transportation needs:    Medical: Not on file    Non-medical: Not on file  Tobacco Use  . Smoking status: Never Smoker  . Smokeless tobacco: Never Used  Substance and Sexual Activity  . Alcohol use: No  . Drug  use: No  . Sexual activity: Not Currently  Lifestyle  . Physical activity:    Days per week: Not on file    Minutes per session: Not on file  . Stress: Not on file  Relationships  . Social connections:    Talks on phone: Not on file    Gets together: Not on file    Attends religious service: Not on file    Active member of club or organization: Not on file    Attends meetings of clubs or organizations: Not on file    Relationship status: Not on file  . Intimate partner violence:    Fear of current or ex partner: Not on file    Emotionally abused: Not on file    Physically abused: Not on file    Forced sexual activity: Not on file  Other Topics Concern  . Not on file  Social History Narrative  . Not on file    No Known Allergies  Family History  Problem Relation Age of Onset  . Hypertension Father     Prior to Admission medications   Medication Sig Start Date End Date Taking? Authorizing Provider  acetaminophen (TYLENOL) 325 MG tablet Take 2 tablets (650 mg total) by mouth every 6 (six) hours as needed for mild pain or headache (or Fever >/= 101). Patient taking differently: Take 500 mg by mouth daily.  09/13/18  Yes Roxan Hockey, MD  aspirin EC 325 MG tablet Take 1 tablet (325 mg total) by mouth daily. Patient taking differently: Take 81 mg by mouth daily.  09/22/18  Yes Hosie Poisson, MD  atorvastatin (LIPITOR) 10 MG tablet Take 1 tablet (10 mg total) by mouth daily. 09/22/18  Yes Hosie Poisson, MD  Calcium Carbonate-Vitamin D (CALCIUM 500 + D) 500-125 MG-UNIT TABS Take 1 tablet by mouth daily. Gummie   Yes [provider]  carvedilol (COREG) 12.5 MG tablet Take 1 tablet (12.5 mg total) by mouth 2 (two) times daily with a meal. 09/13/18  Yes Emokpae, Courage, MD  clopidogrel (PLAVIX) 75 MG tablet Take 1 tablet (75 mg total) by mouth daily with breakfast. 09/13/18  Yes Emokpae, Courage, MD  furosemide (LASIX) 40 MG tablet Take 1 tablet (40 mg total) by mouth  daily. Patient taking differently: Take 20 mg by mouth 2 (two) times daily.  09/17/18 11/16/18 Yes Sainani, Belia Heman, MD  glipiZIDE (GLUCOTROL) 5 MG tablet Take 0.5 tablets (2.5 mg total) by mouth 2 (two) times daily before a meal. Always take with food 09/13/18  Yes Emokpae, Courage, MD  hydrOXYzine (ATARAX/VISTARIL) 10 MG tablet Take 1 tablet (10 mg total) by mouth 3 (three) times daily as needed for anxiety. 09/22/18  Yes Hosie Poisson, MD  pantoprazole (PROTONIX) 20 MG tablet Take 1 tablet (20 mg total) by mouth daily. 07/13/14  Yes Kindl, Nelda Severe, MD  polyethylene glycol (MIRALAX / [name])  packet Take 17 g by mouth daily as needed (for constipation.).    Yes [provider]  Blood Glucose Monitoring Suppl (ACCU-CHEK GUIDE) w/Device KIT See admin instructions. 12/28/17   [provider]    Physical Exam: Vitals:   10/15/18 1015 10/15/18 1030 10/15/18 1100 10/15/18 1130  BP: (!) 145/77 (!) 147/85 (!) 145/86 (!) 142/79  Pulse: 73 78 72 75  Resp: 15 (!) 25  (!) 22  Temp:      TempSrc:      SpO2: 97% 97% 98% 96%     . General: Elderly thin female.  Appears calm and comfortable and is in NAD . Eyes:  PERRL, EOMI, normal lids, iris . ENT:  grossly normal hearing, lips & tongue, mmm.  Upper and lower dentures. . Neck:  supple, no lymphadenopathy. . Cardiovascular:  nL S1, S2, normal rate, reg rhythm, no murmur.  She has JVD. Marland Kitchen Respiratory: Bibasilar Rales, no wheezing.  No increased work of breathing. . Abdomen:  soft, NT, ND, NABS . Back:   grossly normal alignment . Skin:  no rash or lesions seen on limited exam . Musculoskeletal: Diffuse muscle atrophy, good ROM, no bony abnormality or obvious joint deformity . Lower extremities: No LE edema.  Limited foot exam with no ulcerations.  2+ distal pulses. Marland Kitchen Psychiatric:  grossly normal mood and affect, speech is somewhat halting.  She is alert.  Somewhat confused. . Neurologic:  CN 2-12 grossly intact, moves all  extremities in coordinated fashion.    Radiological Exams on Admission: Dg Chest 2 View  Result Date: 10/15/2018 CLINICAL DATA:  Weakness and fatigue for 1 week. Constant mild shortness of breath on home oxygen. History of diabetes and hypertension. EXAM: CHEST - 2 VIEW COMPARISON:  09/22/2018 and 09/20/2018. FINDINGS: Stable mild cardiomegaly and diffuse aortic atherosclerosis. There are left-greater-than-right pleural effusions with associated bibasilar pulmonary opacities and mild edema. The left pleural effusion has enlarged compared with the previous study. The aeration of the right lung base has improved. There is no pneumothorax or acute osseous abnormality. Telemetry leads overlie the chest. IMPRESSION: Fluctuating pulmonary edema, pleural effusions and bibasilar pulmonary opacities, likely congestive heart failure and associated basilar atelectasis. The edema has mildly improved from previous study 3 weeks ago. Electronically Signed   By: Richardean Sale M.D.   On: 10/15/2018 10:56    EKG: Independently reviewed.  Date/Time:                  Saturday October 15 2018 09:59:51 EST Ventricular Rate:         79 PR Interval:                   QRS Duration: 101 QT Interval:                 422 QTC Calculation:        484 R Axis:                         58 Text Interpretation:       Sinus rhythm Probable anterior infarct, age indeterminate Lateral leads are also involved No significant change since last tracing  Labs on Admission: I have personally reviewed the available labs and imaging studies at the time of the admission.  Pertinent labs:  Sodium 142 potassium 4.4 chloride 109 CO2 23 BUN 46 creatinine 3.12 glucose 142 calcium 8.9 albumin 2.9 BNP 2046.5 Troponin 0 0.03 WBC 6.2 hemoglobin 10.2  platelets 292  Last TTE 10/62/6948 Systolic function severely reduced at 25 to 30%.  Mild LVH.  Focal basal hypertrophy.  Akinesis of the mid apical myocardium. Moderate to severe mitral  regurgitation.  PA peak pressure 35 mg mmHg, left pleural effusion  Assessment/Plan  Principal Problem:   Acute on chronic systolic CHF (congestive heart failure) (HCC) Active Problems:   Hypertension   Dyslipidemia   ARF (acute renal failure) (HCC)   CKD (chronic kidney disease) stage 4, GFR 15-29 ml/min (HCC)   Shortness of breath   Diabetes (HCC)   CAD (coronary artery disease)   Acute on chronic systolic CHF: Symptoms of increased dyspnea, weakness, fatigue along with persistent pulmonary edema and significantly elevated BNP over her baseline all indicate acute CHF.  She states that she is compliant with her medications and diet and fluid restriction.  She has had no chest pain.  It is unclear what triggered this exacerbation but I have emphasized the importance of getting a good scale and keeping track of her daily weights in the morning after urinating. -Schedule IV Lasix 40 mg twice daily -Strict I's and O's, daily weights -Sodium and fluid restriction -Monitor renal function, electrolytes -Telemetry -will cycle cardiac enzymes but I doubt this is acute coronary syndrome  Hypertension:  -Continue carvedilol.  Blood pressure is above goal, heart rate is in the 70s to 80s.  Continue to monitor and consider increasing carvedilol.  CAD, HLD: -Continue aspirin, Plavix, statin, beta-blocker  AOCKD -Baseline creatinine appears to be around 2.5.  It is 3.12 today.  Likely due to decreased renal perfusion from acute CHF. -Lasix as above -Monitor renal function and electrolytes daily -Strict ins and out, daily weights -Fluid restriction  DM2; not on insulin at home -Hold glipizide -Sliding scale insulin -Carb controlled diet  GERD -Continue Protonix    DVT prophylaxis: heparin [place] Code Status: DNR - confirmed with patient/family Family Communication: Granddaughter at bedside  Disposition Plan:  Home once clinically improved Consults called: PT/OT  Admission status: Admit  - It is my clinical opinion that admission to INPATIENT is reasonable and necessary because of the expectation that this patient will require hospital care that crosses at least 2 midnights to treat this condition based on the medical complexity of the problems presented.  Given the aforementioned information, the predictability of an adverse outcome is felt to be significant.     Janora Norlander MD [facility]  If note is complete, please contact covering daytime or nighttime physician. [email or web address] Password [email or web address]  10/15/2018, 12:56 PM

## 2018-10-15 NOTE — ED Provider Notes (Signed)
[facility] EMERGENCY DEPARTMENT Provider Note   CSN: [id number] Arrival date & time: 10/15/18  8413     History   Chief Complaint Chief Complaint  Patient presents with  . Dizziness  . Epistaxis    HPI [name] is a 84 y.o. female.  HPI   84 yo F with h/o CHF, HLD, HTN, PVD here with dizziness. Pt reports that starting yesterday, she experienced acute onset of small, controlled bleeding from her R nares. This was controlled with pressure. She passed moderate amount of dark, red clots from her nose. She has now developed associated generalized weakness, dizziness upon standing. No CP, SOB, cough. No abd pain, n/v/d. No LE edema. Sx worse with exertion and movement, no alleviating factors. No h/o recurrent nose bleeds. H/o anemia per her report. She is on ASA/Plavix for CAD.  Past Medical History:  Diagnosis Date  . ACS (acute coronary syndrome) ([place]) 08/2018  . Anemia    family unsure of this history  . Arthritis   . CHF (congestive heart failure) ([place])   . Chronic ulcer of calf (HCC)    Left  . Constipation   . Diabetes mellitus without complication ([place])   . Dyslipidemia   . Dyspnea   . GERD (gastroesophageal reflux disease)   . Hyperlipidemia   . Hypertension   . Peripheral vascular disease [facility])     Patient Active Problem List   Diagnosis Date Noted  . Diabetes ([place]) 09/29/2018  . Elevated troponin   . Shortness of breath   . Hypothermia 09/20/2018  . CHF (congestive heart failure) ([place]) 09/16/2018  . Advanced care planning/counseling discussion   . Goals of care, counseling/discussion   . Palliative care by specialist   . DNR (do not resuscitate)   . Weakness generalized   . CKD (chronic kidney disease) stage 4, GFR 15-29 ml/min (HCC) 09/10/2018  . Anemia in chronic kidney disease 08/25/2018  . Gangrene of toe of left foot ([place]) 12/23/2017  . PAD (peripheral artery disease) ([place]) 09/21/2017  . Atherosclerosis of artery of  extremity with ulceration ([place]) 09/08/2017  . Atherosclerosis of native arteries of the extremities with ulceration ([place]) 06/25/2017  . Stroke ([place]) 12/31/2013  . TIA (transient ischemic attack) 12/31/2013  . ARF (acute renal failure) ([place]) 12/31/2013  . Hypertension   . Dyslipidemia   . Constipation   . Anemia     Past Surgical History:  Procedure Laterality Date  . ABDOMINAL AORTOGRAM W/LOWER EXTREMITY Left 12/27/2017   Procedure: ABDOMINAL AORTOGRAM W/LOWER EXTREMITY;  Surgeon: Algernon Huxley, MD;  Location: [facility];  Service: Cardiovascular;  Laterality: Left;  . ABDOMINAL HYSTERECTOMY    . AMPUTATION TOE Left 12/23/2017   Procedure: AMPUTATION TOE LEFT GREAT TOE;  Surgeon: Sharlotte Alamo, DPM;  Location: [facility];  Service: Podiatry;  Laterality: Left;  . ENDARTERECTOMY FEMORAL Left 09/08/2017   Procedure: ENDARTERECTOMY FEMORAL WITH CORMATRIX PATCH;  Surgeon: Algernon Huxley, MD;  Location: [facility];  Service: Vascular;  Laterality: Left;  . EYE SURGERY Bilateral    Cataract Extraction with IOL  . LOWER EXTREMITY ANGIOGRAPHY Left 07/22/2017   Procedure: Lower Extremity Angiography;  Surgeon: Algernon Huxley, MD;  Location: [place] [facility];  Service: Cardiovascular;  Laterality: Left;  . LOWER EXTREMITY ANGIOGRAPHY Left 08/16/2017   Procedure: Lower Extremity Angiography;  Surgeon: Algernon Huxley, MD;  Location: [place];  Service: Cardiovascular;  Laterality: Left;  Marland Kitchen MULTIPLE TOOTH EXTRACTIONS  OB History   None      Home Medications    Prior to Admission medications   Medication Sig Start Date End Date Taking? Authorizing Provider  acetaminophen (TYLENOL) 325 MG tablet Take 2 tablets (650 mg total) by mouth every 6 (six) hours as needed for mild pain or headache (or Fever >/= 101). 09/13/18   Roxan Hockey, MD  aspirin EC 325 MG tablet Take 1 tablet (325 mg total) by mouth daily. 09/22/18   Hosie Poisson, MD  atorvastatin (LIPITOR) 10 MG tablet  Take 1 tablet (10 mg total) by mouth daily. 09/22/18   Hosie Poisson, MD  Blood Glucose Monitoring Suppl (ACCU-CHEK GUIDE) w/Device KIT See admin instructions. 12/28/17   [provider]  Calcium Carbonate-Vitamin D (CALCIUM 500 + D) 500-125 MG-UNIT TABS Take 1 tablet by mouth daily.     [provider]  carvedilol (COREG) 12.5 MG tablet Take 1 tablet (12.5 mg total) by mouth 2 (two) times daily with a meal. 09/13/18   Denton Brick, Courage, MD  clopidogrel (PLAVIX) 75 MG tablet Take 1 tablet (75 mg total) by mouth daily with breakfast. 09/13/18   Denton Brick, Courage, MD  furosemide (LASIX) 40 MG tablet Take 1 tablet (40 mg total) by mouth daily. Patient taking differently: Take 20 mg by mouth 2 (two) times daily.  09/17/18 11/16/18  Henreitta Leber, MD  glipiZIDE (GLUCOTROL) 5 MG tablet Take 0.5 tablets (2.5 mg total) by mouth 2 (two) times daily before a meal. Always take with food 09/13/18   Roxan Hockey, MD  hydrOXYzine (ATARAX/VISTARIL) 10 MG tablet Take 1 tablet (10 mg total) by mouth 3 (three) times daily as needed for anxiety. 09/22/18   Hosie Poisson, MD  pantoprazole (PROTONIX) 20 MG tablet Take 1 tablet (20 mg total) by mouth daily. 07/13/14   Billy Fischer, MD  polyethylene glycol (MIRALAX / [name]) packet Take 17 g by mouth daily as needed (for constipation.).     [provider]    Family History Family History  Problem Relation Age of Onset  . Hypertension Father     Social History Social History   Tobacco Use  . Smoking status: Never Smoker  . Smokeless tobacco: Never Used  Substance Use Topics  . Alcohol use: No  . Drug use: No     Allergies   Patient has no known allergies.   Review of Systems Review of Systems  Constitutional: Positive for fatigue. Negative for chills and fever.  HENT: Positive for nosebleeds. Negative for congestion, rhinorrhea and sore throat.   Eyes: Negative for visual disturbance.  Respiratory: Negative for  cough, shortness of breath and wheezing.   Cardiovascular: Negative for chest pain and leg swelling.  Gastrointestinal: Negative for abdominal pain, diarrhea, nausea and vomiting.  Genitourinary: Negative for dysuria, flank pain, vaginal bleeding and vaginal discharge.  Musculoskeletal: Negative for neck pain.  Skin: Negative for rash.  Allergic/Immunologic: Negative for immunocompromised state.  Neurological: Positive for light-headedness. Negative for syncope and headaches.  Hematological: Does not bruise/bleed easily.  All other systems reviewed and are negative.    Physical Exam Updated Vital Signs BP (!) 142/79   Pulse 75   Temp (!) 97.5 F (36.4 C) (Oral)   Resp (!) 22   SpO2 96%   Physical Exam  Constitutional: She is oriented to person, place, and time. She appears well-developed and well-nourished. No distress.  HENT:  Head: Normocephalic and atraumatic.  Exposed capillary bed R anterior nares at [place] plexus. Intact  clot. OP clear. Markedly dry MM of b/l nares.  Eyes: Conjunctivae are normal.  Neck: Neck supple.  Cardiovascular: Normal rate, regular rhythm and normal heart sounds. Exam reveals no friction rub.  No murmur heard. Pulmonary/Chest: Effort normal and breath sounds normal. No respiratory distress. She has no wheezes. She has no rales.  Abdominal: She exhibits no distension.  Musculoskeletal: She exhibits no edema.  Neurological: She is alert and oriented to person, place, and time. She exhibits normal muscle tone.  Skin: Skin is warm. Capillary refill takes less than 2 seconds.  Psychiatric: She has a normal mood and affect.  Nursing note and vitals reviewed.    ED Treatments / Results  Labs (all labs ordered are listed, but only abnormal results are displayed) Labs Reviewed  CBC WITH DIFFERENTIAL/PLATELET - Abnormal; Notable for the following components:      Result Value   Hemoglobin 10.2 (*)    HCT 33.0 (*)    MCH 24.9 (*)    RDW 18.0  (*)    nRBC 0.3 (*)    All other components within normal limits  COMPREHENSIVE METABOLIC PANEL - Abnormal; Notable for the following components:   Glucose, Bld 142 (*)    BUN 46 (*)    Creatinine, Ser 3.12 (*)    Total Protein 8.5 (*)    Albumin 2.9 (*)    GFR calc non Af Amer 12 (*)    GFR calc Af Amer 14 (*)    All other components within normal limits  TROPONIN I - Abnormal; Notable for the following components:   Troponin I 0.03 (*)    All other components within normal limits  BRAIN NATRIURETIC PEPTIDE - Abnormal; Notable for the following components:   B Natriuretic Peptide 2,046.5 (*)    All other components within normal limits  URINALYSIS, ROUTINE W REFLEX MICROSCOPIC    EKG EKG Interpretation  Date/Time:  Saturday October 15 2018 09:59:51 EST Ventricular Rate:  79 PR Interval:    QRS Duration: 101 QT Interval:  422 QTC Calculation: 484 R Axis:   58 Text Interpretation:  Sinus rhythm Probable anterior infarct, age indeterminate Lateral leads are also involved No significant change since last tracing Confirmed by Duffy Bruce [phone]) on 10/15/2018 11:26:27 AM   Radiology Dg Chest 2 View  Result Date: 10/15/2018 CLINICAL DATA:  Weakness and fatigue for 1 week. Constant mild shortness of breath on home oxygen. History of diabetes and hypertension. EXAM: CHEST - 2 VIEW COMPARISON:  09/22/2018 and 09/20/2018. FINDINGS: Stable mild cardiomegaly and diffuse aortic atherosclerosis. There are left-greater-than-right pleural effusions with associated bibasilar pulmonary opacities and mild edema. The left pleural effusion has enlarged compared with the previous study. The aeration of the right lung base has improved. There is no pneumothorax or acute osseous abnormality. Telemetry leads overlie the chest. IMPRESSION: Fluctuating pulmonary edema, pleural effusions and bibasilar pulmonary opacities, likely congestive heart failure and associated basilar atelectasis. The edema  has mildly improved from previous study 3 weeks ago. Electronically Signed   By: Richardean Sale M.D.   On: 10/15/2018 10:56    Procedures .Epistaxis Management Date/Time: 10/15/2018 12:44 PM Performed by: Duffy Bruce, MD Authorized by: Duffy Bruce, MD   Consent:    Consent obtained:  Verbal   Consent given by:  Patient   Risks discussed:  Bleeding, infection, nasal injury and pain   Alternatives discussed:  Alternative treatment Anesthesia (see MAR for exact dosages):    Anesthesia method:  Topical application  Topical anesthetic:  Benzocaine gel Procedure details:    Treatment site:  R anterior   Treatment method:  Silver nitrate   Treatment complexity:  Limited   Treatment episode: initial   Post-procedure details:    Assessment:  Bleeding stopped   Patient tolerance of procedure:  Tolerated well, no immediate complications   (including critical care time)  Medications Ordered in ED Medications  silver nitrate applicators applicator 1 Stick (1 Stick Topical Given by Other 10/15/18 1014)  furosemide (LASIX) injection 40 mg (40 mg Intravenous Given 10/15/18 1205)     Initial Impression / Assessment and Plan / ED Course  I have reviewed the triage vital signs and the nursing notes.  Pertinent labs & imaging results that were available during my care of the patient were reviewed by me and considered in my medical decision making (see chart for details).     84 year old female here with generalized weakness and nosebleed.  Regarding her nosebleed, this was treated topically.  Patient tolerated well.  Suspect this is due to increased oxygen use in the setting of her generalized weakness and shortness of breath.  On exam, she has no significant edema, but does have bilateral rales and is tachypneic as high as the 30s.  Lab work shows acute on chronic kidney injury and chest x-ray shows persistent edema.  Her BNP is significantly above baseline.  She is having difficulty  moving about the room.  I suspect she has acute on chronic CHF.  Will diurese and admit.  Final Clinical Impressions(s) / ED Diagnoses   Final diagnoses:  Acute on chronic congestive heart failure, unspecified heart failure type [facility])  Epistaxis    ED Discharge Orders    None       Duffy Bruce, MD 10/15/18 1244

## 2018-10-16 LAB — BASIC METABOLIC PANEL
Anion gap: 15 (ref 5–15)
Anion gap: 16 — ABNORMAL HIGH (ref 5–15)
BUN: 49 mg/dL — ABNORMAL HIGH (ref 8–23)
BUN: 49 mg/dL — ABNORMAL HIGH (ref 8–23)
CO2: 21 mmol/L — ABNORMAL LOW (ref 22–32)
CO2: 19 mmol/L — ABNORMAL LOW (ref 22–32)
Calcium: 9 mg/dL (ref 8.9–10.3)
Calcium: 9.1 mg/dL (ref 8.9–10.3)
Chloride: 108 mmol/L (ref 98–111)
Chloride: 110 mmol/L (ref 98–111)
Creatinine, Ser: 3 mg/dL — ABNORMAL HIGH (ref 0.44–1.00)
Creatinine, Ser: 2.82 mg/dL — ABNORMAL HIGH (ref 0.44–1.00)
GFR calc Af Amer: 15 mL/min — ABNORMAL LOW (ref >60)
GFR calc Af Amer: 16 mL/min — ABNORMAL LOW (ref >60)
GFR calc non Af Amer: 13 mL/min — ABNORMAL LOW (ref >60)
GFR calc non Af Amer: 14 mL/min — ABNORMAL LOW (ref >60)
Glucose, Bld: 125 mg/dL — ABNORMAL HIGH (ref 70–99)
Glucose, Bld: 132 mg/dL — ABNORMAL HIGH (ref 70–99)
Potassium: 6.2 mmol/L — ABNORMAL HIGH (ref 3.5–5.1)
Potassium: 5 mmol/L (ref 3.5–5.1)
Sodium: 144 mmol/L (ref 135–145)
Sodium: 145 mmol/L (ref 135–145)

## 2018-10-16 LAB — GLUCOSE, CAPILLARY
Glucose-Capillary: 130 mg/dL — ABNORMAL HIGH (ref 70–99)
Glucose-Capillary: 86 mg/dL (ref 70–99)
Glucose-Capillary: 125 mg/dL — ABNORMAL HIGH (ref 70–99)

## 2018-10-16 LAB — MAGNESIUM: Magnesium: 2.6 mg/dL — ABNORMAL HIGH (ref 1.7–2.4)

## 2018-10-16 MED ORDER — FUROSEMIDE 10 MG/ML IJ SOLN
100.0000 mg | Freq: Two times a day (BID) | INTRAVENOUS | Status: DC
  Administered 2018-10-16 – 2018-10-17 (×2): 100 mg via INTRAVENOUS
  Filled 2018-10-16 (×3): qty 10

## 2018-10-16 NOTE — Evaluation (Signed)
Physical Therapy Evaluation Patient Details Name: [name] MRN: [id number] DOB: 1926/09/23 Today's Date: 10/16/2018   History of Present Illness   Pt is a 84 y.o. female with PMH including CHF, DM2, CKD4,  who presented to the ED today with c/o generalized weakness x 2 days and nosebleed. Of note: She was admitted to the hospital twice last month and sent home on home oxygen.  Clinical Impression  Orders received for PT evaluation. Patient demonstrates deficits in functional mobility as indicated below. Will benefit from continued skilled PT to address deficits and maximize function. Will see as indicated and progress as tolerated.      Follow Up Recommendations Home health PT;Supervision for mobility/OOB    Equipment Recommendations  None recommended by PT    Recommendations for Other Services       Precautions / Restrictions Precautions Precautions: Fall Precaution Comments: watch O2 sats      Mobility  Bed Mobility Overal bed mobility: Needs Assistance Bed Mobility: Supine to Sit     Supine to sit: Supervision     General bed mobility comments: supervision for safety no physical assist required  Transfers Overall transfer level: Needs assistance Equipment used: 1 person hand held assist Transfers: Sit to/from Stand Sit to Stand: Min assist;Min guard         General transfer comment: min assist for comfort and stability in place of [facility]  Ambulation/Gait Ambulation/Gait assistance: Min assist Gait Distance (Feet): 180 Feet Assistive device: 1 person hand held assist Gait Pattern/deviations: Step-through pattern;Decreased stride length;Drifts right/left;Narrow base of support Gait velocity: decreased Gait velocity interpretation: <1.8 ft/sec, indicate of risk for recurrent falls General Gait Details: min assist for balance and stability in place of cane   Stairs            Wheelchair Mobility    Modified Rankin (Stroke Patients Only)        Balance Overall balance assessment: Mild deficits observed, not formally tested                                           Pertinent Vitals/Pain Pain Assessment: No/denies pain    Home Living Family/patient expects to be discharged to:: Private residence Living Arrangements: Children Available Help at Discharge: Family;Available 24 hours/day Type of Home: House Home Access: Stairs to enter Entrance Stairs-Rails: Right Entrance Stairs-Number of Steps: 2 Home Layout: One level Home Equipment: Shower seat;Grab bars - toilet;Grab bars - tub/shower;Cane - single point;Walker - 2 wheels Additional Comments: pt lives with son who has had a stroke but he is independent and does some of the cooking    Prior Function Level of Independence: Independent with assistive device(s)         Comments:  Uses RW/cane as needed.  Pt lives with her son who does most of the cooking and cleaning.  mod Independent with ADL's.     Hand Dominance   Dominant Hand: Right    Extremity/Trunk Assessment   Upper Extremity Assessment Upper Extremity Assessment: Generalized weakness    Lower Extremity Assessment Lower Extremity Assessment: Generalized weakness    Cervical / Trunk Assessment Cervical / Trunk Assessment: Kyphotic  Communication   Communication: HOH  Cognition Arousal/Alertness: Awake/alert Behavior During Therapy: WFL for tasks assessed/performed Overall Cognitive Status: History of cognitive impairments - at baseline(some memory issues)  General Comments      Exercises     Assessment/Plan    PT Assessment Patient needs continued PT services  PT Problem List Decreased strength;Decreased activity tolerance;Decreased balance;Decreased mobility;Cardiopulmonary status limiting activity       PT Treatment Interventions DME instruction;Gait training;Functional mobility training;Therapeutic  activities;Therapeutic exercise;Balance training;Patient/family education    PT Goals (Current goals can be found in the Care Plan section)  Acute Rehab PT Goals Patient Stated Goal: to go home PT Goal Formulation: With patient/family Time For Goal Achievement: 10/30/18 Potential to Achieve Goals: Good    Frequency Min 3X/week   Barriers to discharge        Co-evaluation               AM-PAC PT "6 Clicks" Daily Activity  Outcome Measure Difficulty turning over in bed (including adjusting bedclothes, sheets and blankets)?: A Little Difficulty moving from lying on back to sitting on the side of the bed? : A Little Difficulty sitting down on and standing up from a chair with arms (e.g., wheelchair, bedside commode, etc,.)?: A Little Help needed moving to and from a bed to chair (including a wheelchair)?: A Little Help needed walking in hospital room?: A Little Help needed climbing 3-5 steps with a railing? : A Little 6 Click Score: 18    End of Session Equipment Utilized During Treatment: Oxygen;Gait belt Activity Tolerance: Patient tolerated treatment well Patient left: in bed;with call bell/phone within reach;with family/visitor present(sitting EOB) Nurse Communication: Mobility status PT Visit Diagnosis: Unsteadiness on feet (R26.81);Muscle weakness (generalized) (M62.81)    Time: 3779-3968 PT Time Calculation (min) (ACUTE ONLY): 19 min   Charges:              Alben Deeds, PT DPT  Board Certified Neurologic Specialist [place] Pager [phone] Office [place] 10/16/2018, 12:59 PM

## 2018-10-16 NOTE — Progress Notes (Signed)
PROGRESS NOTE    [name]  AOZ:[id number] DOB: [birth year] DOA: 10/15/2018 PCP: Lucianne Lei, MD  Outpatient Specialists:    Brief Narrative:  [name] is a 84 y.o. female with medical history significant for systolic CHF with EF of 25 to 30% and akinesis of the mid apical/apical myocardium, DM2, CKD4, anemia, acute coronary syndrome, hypertension, hyperlipidemia and GERD.  Patient was admitted with generalized weakness, shortness of breath, increasing supplemental oxygen demand, elevated BNP with chest x-ray revealing bilateral pulmonary edema.  Worsening serum creatinine of 3.12 noted on presentation (last serum creatinine was 2.60).  Patient is currently being managed for acute on chronic systolic congestive heart failure.  Patient was on IV Lasix 40 mg twice daily, but have increased the dose to 100 mg twice daily, considering advanced chronic kidney disease.  Patient is a tiny lady, currently continue to monitor blood pressure closely, symptoms of volume status and have a low threshold to decrease the dose of diuretics.  Further management depend on hospital course.   Assessment & Plan:   Principal Problem:   Acute on chronic systolic CHF (congestive heart failure) (HCC) Active Problems:   Hypertension   Dyslipidemia   ARF (acute renal failure) (HCC)   CKD (chronic kidney disease) stage 4, GFR 15-29 ml/min (HCC)   Shortness of breath   Diabetes (HCC)   CAD (coronary artery disease)  Acute on chronic systolic CHF:  -Last echo was done in October 2019, revealing an EF of 25 to 30%.   -Increase IV Lasix to 100 and B12 level.   -Strict I's and O's  -Monitor blood pressure closely, renal function and electrolytes.   -Low threshold to lower the dose of diuretics, considering the patient's BMI.  However, patient has advanced CKD -follow cardiac enzymes  Hypertension:  -Continue carvedilol.  -Continue to optimize. -Hopefully, blood pressure will improve with  adequate diuresis.   CAD, HLD: -Continue aspirin, Plavix, statin, beta-blocker  Mild AKI on CKD 4 versus stable CKD 4:  -Baseline creatinine appears to be around 2.5 - 3. -Serum creatinine on presentation was 3.12.  -Continue to monitor renal function with adequate diuresis.  -Further management depend on hospital course.    DM2; not on insulin at home -Hold glipizide -Sliding scale insulin  GERD -Continue Protonix  DVT prophylaxis: heparin [place] Code Status: DNR  Family Communication:  Disposition Plan:   Will depend on hospital course.    Consultants:   None  Procedures:   None  Antimicrobials:   None   Subjective: Shortness of breath is improving. No chest pain  Objective: Vitals:   10/15/18 2351 10/16/18 0300 10/16/18 0600 10/16/18 0724  BP: 138/73  140/71 (!) 158/82  Pulse: 68  79 81  Resp: 18  18 16   Temp: (!) 97 F (36.1 C)     TempSrc: Oral     SpO2: 98%  99% 97%  Weight:  51.5 kg      Intake/Output Summary (Last 24 hours) at 10/16/2018 1031 Last data filed at 10/16/2018 0935 Gross per 24 hour  Intake 520 ml  Output 1200 ml  Net -680 ml   Filed Weights   10/16/18 0300  Weight: 51.5 kg    Examination:  General exam: Appears calm and comfortable.  Cachectic. Respiratory system: Clear to auscultation. Respiratory effort normal. Cardiovascular system: S1 & S2 heard.  No pedal edema. Gastrointestinal system: Abdomen is nondistended, soft and nontender. No organomegaly or masses felt. Normal bowel sounds heard. Central nervous system:  Alert and oriented. No focal neurological deficits. Extremities: No leg edema.  Data Reviewed: I have personally reviewed following labs and imaging studies  CBC: Recent Labs  Lab 10/12/18 1130 10/15/18 1000  WBC 9.8 6.2  NEUTROABS 7.3 3.8  HGB 10.2* 10.2*  HCT 32.6* 33.0*  MCV 81.5 80.7  PLT 231 474   Basic Metabolic Panel: Recent Labs  Lab 10/15/18 1000 10/16/18 0558 10/16/18 0748  NA  142 144 145  K 4.4 6.2* 5.0  CL 109 108 110  CO2 23 21* 19*  GLUCOSE 142* 125* 132*  BUN 46* 49* 49*  CREATININE 3.12* 3.00* 2.82*  CALCIUM 8.9 9.0 9.1   GFR: Estimated Creatinine Clearance: 10.8 mL/min (A) (by C-G formula based on SCr of 2.82 mg/dL (H)). Liver Function Tests: Recent Labs  Lab 10/15/18 1000  AST 29  ALT 18  ALKPHOS 65  BILITOT 0.8  PROT 8.5*  ALBUMIN 2.9*   No results for input(s): LIPASE, AMYLASE in the last 168 hours. No results for input(s): AMMONIA in the last 168 hours. Coagulation Profile: No results for input(s): INR, PROTIME in the last 168 hours. Cardiac Enzymes: Recent Labs  Lab 10/15/18 1000 10/15/18 1345 10/15/18 2015  TROPONINI 0.03* 0.03* 0.03*   BNP (last 3 results) No results for input(s): PROBNP in the last 8760 hours. HbA1C: No results for input(s): HGBA1C in the last 72 hours. CBG: Recent Labs  Lab 10/15/18 2104 10/16/18 0720  GLUCAP 133* 130*   Lipid Profile: No results for input(s): CHOL, HDL, LDLCALC, TRIG, CHOLHDL, LDLDIRECT in the last 72 hours. Thyroid Function Tests: No results for input(s): TSH, T4TOTAL, FREET4, T3FREE, THYROIDAB in the last 72 hours. Anemia Panel: No results for input(s): VITAMINB12, FOLATE, FERRITIN, TIBC, IRON, RETICCTPCT in the last 72 hours. Urine analysis:    Component Value Date/Time   COLORURINE STRAW (A) 10/15/2018 1938   APPEARANCEUR CLEAR 10/15/2018 1938   LABSPEC 1.010 10/15/2018 1938   PHURINE 6.0 10/15/2018 1938   GLUCOSEU NEGATIVE 10/15/2018 1938   HGBUR NEGATIVE 10/15/2018 1938   BILIRUBINUR NEGATIVE 10/15/2018 1938   KETONESUR NEGATIVE 10/15/2018 1938   PROTEINUR NEGATIVE 10/15/2018 1938   UROBILINOGEN 1.0 09/07/2016 1403   NITRITE NEGATIVE 10/15/2018 1938   LEUKOCYTESUR TRACE (A) 10/15/2018 1938   Sepsis Labs: @LABRCNTIP (procalcitonin:4,lacticidven:4)  ) Recent Results (from the past 240 hour(s))  MRSA PCR Screening     Status: None   Collection Time: 10/15/18  4:46  PM  Result Value Ref Range Status   MRSA by PCR NEGATIVE NEGATIVE Final    Comment:        The GeneXpert MRSA Assay (FDA approved for NASAL specimens only), is one component of a comprehensive MRSA colonization surveillance program. It is not intended to diagnose MRSA infection nor to guide or monitor treatment for MRSA infections. Performed at [facility], [place] [address] [place], [place] [address]          Radiology Studies: Dg Chest 2 View  Result Date: 10/15/2018 CLINICAL DATA:  Weakness and fatigue for 1 week. Constant mild shortness of breath on home oxygen. History of diabetes and hypertension. EXAM: CHEST - 2 VIEW COMPARISON:  09/22/2018 and 09/20/2018. FINDINGS: Stable mild cardiomegaly and diffuse aortic atherosclerosis. There are left-greater-than-right pleural effusions with associated bibasilar pulmonary opacities and mild edema. The left pleural effusion has enlarged compared with the previous study. The aeration of the right lung base has improved. There is no pneumothorax or acute osseous abnormality. Telemetry leads overlie the chest.  IMPRESSION: Fluctuating pulmonary edema, pleural effusions and bibasilar pulmonary opacities, likely congestive heart failure and associated basilar atelectasis. The edema has mildly improved from previous study 3 weeks ago. Electronically Signed   By: Richardean Sale M.D.   On: 10/15/2018 10:56        Scheduled Meds: . acetaminophen  500 mg Oral Daily  . aspirin EC  81 mg Oral Daily  . atorvastatin  10 mg Oral Daily  . calcium-vitamin D  1 tablet Oral Daily  . carvedilol  12.5 mg Oral BID WC  . clopidogrel  75 mg Oral Q breakfast  . heparin  5,000 Units Subcutaneous Q8H  . insulin aspart  0-5 Units Subcutaneous QHS  . insulin aspart  0-9 Units Subcutaneous TID WC  . pantoprazole  20 mg Oral Daily  . sodium chloride flush  3 mL Intravenous Q12H   Continuous Infusions: . sodium chloride    . furosemide       LOS:  1 day    Time spent: 35 minutes    Dana Allan, MD  [facility] Pager #: [phone] 7PM-7AM contact night coverage as above

## 2018-10-16 NOTE — Evaluation (Signed)
Occupational Therapy Evaluation Patient Details Name: [name] MRN: [id number] DOB: [birth year] Today's Date: 10/16/2018    History of Present Illness  Pt is a 84 y.o. female with PMH including CHF, DM2, CKD4,  who presented to the ED today with c/o generalized weakness x 2 days and nosebleed. Of note: She was admitted to the hospital twice last month and sent home on home oxygen.   Clinical Impression   PTA patient independent with ADLs, limited IADLs, used cane for mobility.  She currently requires min guard assist for mobility and transfers, min guard for grooming standing at sink, min guard for LB ADL and setup assist for UB ADLs.  Patient will benefit from continued OT services while admitted and after dc at [facility] level in order to optimize independence and return to PLOF.  Will continue to follow.     Follow Up Recommendations  Home health OT;Supervision/Assistance - 24 hour    Equipment Recommendations  None recommended by OT    Recommendations for Other Services       Precautions / Restrictions Precautions Precautions: Fall Precaution Comments: watch O2 sats      Mobility Bed Mobility Overal bed mobility: Needs Assistance Bed Mobility: Supine to Sit     Supine to sit: Supervision     General bed mobility comments: supervision for safety no physical assist required  Transfers Overall transfer level: Needs assistance Equipment used: 1 person hand held assist Transfers: Sit to/from Stand Sit to Stand: Min guard         General transfer comment: min guard for safeyt and balance    Balance Overall balance assessment: Mild deficits observed, not formally tested                                         ADL either performed or assessed with clinical judgement   ADL Overall ADL's : Needs assistance/impaired     Grooming: Min guard;Standing;Wash/dry hands   Upper Body Bathing: Set up;Sitting   Lower Body Bathing: Sit to/from  stand;Min guard   Upper Body Dressing : Supervision/safety;Sitting   Lower Body Dressing: Min guard;Sit to/from stand Lower Body Dressing Details (indicate cue type and reason): able to don/doff socks, min guard in standing  Toilet Transfer: Min guard;Ambulation;Grab bars;Regular Museum/gallery exhibitions officer and Hygiene: Supervision/safety;Sit to/from stand;Min guard Toileting - Clothing Manipulation Details (indicate cue type and reason): min guard initially, fading to supervision     Functional mobility during ADLs: Min guard General ADL Comments: supplemental oxgyen via [place] worn to sink but removed to enter bathroom, saturation on room air maintained at 91%     Vision Baseline Vision/History: Wears glasses Patient Visual Report: No change from baseline Vision Assessment?: No apparent visual deficits     Perception     Praxis      Pertinent Vitals/Pain Pain Assessment: No/denies pain     Hand Dominance Right   Extremity/Trunk Assessment Upper Extremity Assessment Upper Extremity Assessment: Generalized weakness   Lower Extremity Assessment Lower Extremity Assessment: Defer to PT evaluation   Cervical / Trunk Assessment Cervical / Trunk Assessment: Kyphotic   Communication Communication Communication: HOH   Cognition Arousal/Alertness: Awake/alert Behavior During Therapy: WFL for tasks assessed/performed Overall Cognitive Status: History of cognitive impairments - at baseline(memory)  General Comments  grand-daughter present and supportive    Exercises     Shoulder Instructions      Home Living Family/patient expects to be discharged to:: Private residence Living Arrangements: Children Available Help at Discharge: Family;Available 24 hours/day Type of Home: House Home Access: Stairs to enter [employer] of Steps: 2 Entrance Stairs-Rails: Right Home Layout: One level      Bathroom Shower/Tub: Teacher, early years/pre: Standard     Home Equipment: Shower seat;Grab bars - toilet;Grab bars - tub/shower;Cane - single point;Walker - 2 wheels   Additional Comments: pt lives with son who has had a stroke but he is independent and does some of the cooking      Prior Functioning/Environment Level of Independence: Independent with assistive device(s)        Comments:  Uses RW/cane as needed.  modified independent with ADLs, limited IADLs         OT Problem List: Decreased strength;Decreased activity tolerance;Cardiopulmonary status limiting activity;Impaired balance (sitting and/or standing)      OT Treatment/Interventions: Self-care/ADL training;Therapeutic exercise;DME and/or AE instruction;Energy conservation;Therapeutic activities;Patient/family education;Balance training    OT Goals(Current goals can be found in the care plan section) Acute Rehab OT Goals Patient Stated Goal: to go home OT Goal Formulation: With patient Time For Goal Achievement: 10/30/18 Potential to Achieve Goals: Good  OT Frequency: Min 2X/week   Barriers to D/C:            Co-evaluation              AM-PAC PT "6 Clicks" Daily Activity     Outcome Measure Help from another person eating meals?: None Help from another person taking care of personal grooming?: A Little Help from another person toileting, which includes using toliet, bedpan, or urinal?: A Little Help from another person bathing (including washing, rinsing, drying)?: A Little Help from another person to put on and taking off regular upper body clothing?: None Help from another person to put on and taking off regular lower body clothing?: A Little 6 Click Score: 20   End of Session Equipment Utilized During Treatment: Oxygen Nurse Communication: Mobility status  Activity Tolerance: Patient tolerated treatment well Patient left: with call bell/phone within reach;with family/visitor  present;Other (comment)(seated EOB )  OT Visit Diagnosis: Unsteadiness on feet (R26.81);Muscle weakness (generalized) (M62.81)                Time: 2248-2500 OT Time Calculation (min): 15 min Charges:  OT General Charges $OT Visit: 1 Visit OT Evaluation $OT Eval Moderate Complexity: Deputy, OT Acute Rehabilitation Services Pager [phone] Office [phone]   Delight Stare 10/16/2018, 1:07 PM

## 2018-10-17 ENCOUNTER — Inpatient Hospital Stay (HOSPITAL_COMMUNITY): Payer: Medicare Other

## 2018-10-17 DIAGNOSIS — I10 Essential (primary) hypertension: Secondary | ICD-10-CM

## 2018-10-17 DIAGNOSIS — N184 Chronic kidney disease, stage 4 (severe): Secondary | ICD-10-CM

## 2018-10-17 DIAGNOSIS — N179 Acute kidney failure, unspecified: Secondary | ICD-10-CM

## 2018-10-17 DIAGNOSIS — I5023 Acute on chronic systolic (congestive) heart failure: Secondary | ICD-10-CM

## 2018-10-17 LAB — CBC WITH DIFFERENTIAL/PLATELET
Abs Immature Granulocytes: 0.03 10*3/uL (ref 0.00–0.07)
Basophils Absolute: 0 10*3/uL (ref 0.0–0.1)
Basophils Relative: 0 %
Eosinophils Absolute: 0.2 10*3/uL (ref 0.0–0.5)
Eosinophils Relative: 4 %
HCT: 31.1 % — ABNORMAL LOW (ref 36.0–46.0)
Hemoglobin: 9.5 g/dL — ABNORMAL LOW (ref 12.0–15.0)
Immature Granulocytes: 1 %
Lymphocytes Relative: 35 %
Lymphs Abs: 2.2 10*3/uL (ref 0.7–4.0)
MCH: 24.3 pg — ABNORMAL LOW (ref 26.0–34.0)
MCHC: 30.5 g/dL (ref 30.0–36.0)
MCV: 79.5 fL — ABNORMAL LOW (ref 80.0–100.0)
Monocytes Absolute: 0.7 10*3/uL (ref 0.1–1.0)
Monocytes Relative: 12 %
Neutro Abs: 3.1 10*3/uL (ref 1.7–7.7)
Neutrophils Relative %: 48 %
Platelets: 297 10*3/uL (ref 150–400)
RBC: 3.91 MIL/uL (ref 3.87–5.11)
RDW: 17.8 % — ABNORMAL HIGH (ref 11.5–15.5)
WBC: 6.4 10*3/uL (ref 4.0–10.5)
nRBC: 0 % (ref 0.0–0.2)

## 2018-10-17 LAB — RENAL FUNCTION PANEL
Albumin: 2.5 g/dL — ABNORMAL LOW (ref 3.5–5.0)
Anion gap: 6 (ref 5–15)
BUN: 46 mg/dL — ABNORMAL HIGH (ref 8–23)
CO2: 29 mmol/L (ref 22–32)
Calcium: 8.5 mg/dL — ABNORMAL LOW (ref 8.9–10.3)
Chloride: 108 mmol/L (ref 98–111)
Creatinine, Ser: 3.02 mg/dL — ABNORMAL HIGH (ref 0.44–1.00)
GFR calc Af Amer: 15 mL/min — ABNORMAL LOW (ref >60)
GFR calc non Af Amer: 13 mL/min — ABNORMAL LOW (ref >60)
Glucose, Bld: 117 mg/dL — ABNORMAL HIGH (ref 70–99)
Phosphorus: 4.7 mg/dL — ABNORMAL HIGH (ref 2.5–4.6)
Potassium: 3.9 mmol/L (ref 3.5–5.1)
Sodium: 143 mmol/L (ref 135–145)

## 2018-10-17 LAB — GLUCOSE, CAPILLARY
Glucose-Capillary: 149 mg/dL — ABNORMAL HIGH (ref 70–99)
Glucose-Capillary: 124 mg/dL — ABNORMAL HIGH (ref 70–99)
Glucose-Capillary: 115 mg/dL — ABNORMAL HIGH (ref 70–99)
Glucose-Capillary: 139 mg/dL — ABNORMAL HIGH (ref 70–99)

## 2018-10-17 LAB — MAGNESIUM: Magnesium: 2.4 mg/dL (ref 1.7–2.4)

## 2018-10-17 MED ORDER — FUROSEMIDE 10 MG/ML IJ SOLN
40.0000 mg | Freq: Once | INTRAMUSCULAR | Status: AC
  Administered 2018-10-17: 40 mg via INTRAVENOUS
  Filled 2018-10-17: qty 4

## 2018-10-17 NOTE — Progress Notes (Signed)
SATURATION QUALIFICATIONS: (This note is used to comply with regulatory documentation for home oxygen)  Patient Saturations on Room Air at Rest = 96%  Patient Saturations on Room Air while Ambulating = 96-92%  Please briefly explain why patient needs home oxygen:  Pt with supplemental home O2 at baseline. Recommend continued use as needed as pt dropping to 90% during ADLs standing at sink.   [place], OTR/L Acute Rehab Pager: [phone] Office: [phone]

## 2018-10-17 NOTE — Progress Notes (Signed)
Pt to u/s via bed in stable condition, telemetry called in advise pt leaving floor , spoke to sandra in [place]

## 2018-10-17 NOTE — Consult Note (Signed)
   [facility] CM Inpatient Consult   10/17/2018  KHLOEE GARZA 1926/09/24 [id number]  Patient screened for [place] Management services due to unplanned readmission risk score of 31% and multiple hospitalizations. Patient has consent on file.    Went to bedside to speak with patient regarding [place]. Notified patient that [facility] community registered nurse had made several attempts to engage her for services in the past month but attempts had been unsuccessful. Patient states that communication should be made with granddaughters for follow up. Patient confirms that she would like to receive hospital follow-up regarding chronic disease management by [facility] community nurse. Patient denies needs for meals, transportation, or medication assistance at this time.   Patient states that her son lives with her, and she has family support from her granddaughters and another son.   Spoke with patient's granddaughter, Cristina Gong, when she arrived at the bedside. Ms. Rush Farmer states that it is best to contact Cristina Gong, [phone] or Lolita Patella, [phone], both granddaughters. [facility] brochure and magnet left with granddaughter.   Will continue to follow for progression plans.   Of note, [facility] Care Management services does not replace or interfere with any services that are needed or arranged by inpatient case management or social work.     Netta Cedars, MSN, [facility] Liaison Nurse Mobile Phone [phone]  Toll free office [phone]

## 2018-10-17 NOTE — Progress Notes (Signed)
PROGRESS NOTE    [name]  WUJ:[id number] DOB: 14-Jun-1926 DOA: 10/15/2018 PCP: Lucianne Lei, MD  Outpatient Specialists:    Brief Narrative:  [name] is a 84 y.o. female with medical history significant for chronic systolic CHF with EF of 25 to 30% and akinesis of the mid apical/apical myocardium, DM2, CKD4, anemia, acute coronary syndrome, hypertension, hyperlipidemia and GERD.  Patient was admitted with generalized weakness, shortness of breath, increasing supplemental oxygen demand, elevated BNP with chest x-ray revealing bilateral pulmonary edema.  Worsening serum creatinine of 3.12 noted on presentation (last serum creatinine was 2.60).  Patient is currently being managed for acute on chronic systolic congestive heart failure.  Her Lasix was increased from IV 40 mg twice daily to 100 mg twice daily.  Although volume status has improved, creatinine has again gone up from 2.82-3.02.  Renal ultrasound shows no hydronephrosis.  Patient having failure to thrive and this is her fourth hospital admission in the last 6 months.  All for CHF exacerbations.  Palliative care and cardiology have seen her on previous admissions.   Assessment & Plan:   Principal Problem:   Acute on chronic systolic CHF (congestive heart failure) (HCC) Active Problems:   Hypertension   Dyslipidemia   ARF (acute renal failure) (HCC)   CKD (chronic kidney disease) stage 4, GFR 15-29 ml/min (HCC)   Shortness of breath   Diabetes (HCC)   CAD (coronary artery disease)  Acute on chronic systolic CHF:  -Last echo was done in October 2019, revealing an EF of 25 to 30%.   -As stated above, IV Lasix had been increased from 40 mg twice daily to 100 mg twice daily.  Volume status has improved but creatinine has gone up from 2.8-3.02.  Still appears mildly volume overloaded as evidenced by crackles on lung auscultation, mild JVD elevation and trace leg edema.  Also chest x-ray 11/18 personally reviewed,  pulmonary edema is better but has bibasilar atelectasis with small pleural effusions. -Reduce Lasix to 40 mg every 12.  Not sure if intake output and weights are accurate.  Weight down by only 1 pound and negative 461 mL since admission. -Monitor closely.  Hypertension:  - Continue carvedilol.  -Controlled.  CAD, HLD: -Continue aspirin, Plavix, statin, beta-blocker.  Stable.  Mild AKI on CKD 4 versus stable CKD 4:  -Interestingly patient had normal creatinine of 0.86- 1.04 in January 2019.  Since June however her creatinine has fluctuated in the 1.9- 3 range.  Most recently 2.62 on 10/24.  Presented with creatinine of 3.12.  Acute kidney injury likely cardiorenal.  Reduced IV Lasix as noted above.  Follow BMP in a.m.  Would not be a candidate for RRT given her advanced age.  Renal ultrasound without hydronephrosis and shows chronic medical disease.  DM2; not on insulin at home -Hold glipizide -Sliding scale insulin.  Reasonable inpatient control.  GERD -Continue Protonix  Microcytic anemia Could be related to iron deficiency versus chronic kidney disease.  Stable.  Outpatient follow-up.  Adult failure to thrive Palliative care has consulted during prior hospitalization.  DVT prophylaxis: heparin [place] Code Status: DNR  Family Communication:  Discussed in detail with patient's granddaughter at bedside, updated care and answered questions. Disposition Plan:   DC home pending clinical improvement, possibly 11/19 pending stability of creatinine and CHF.  Consultants:   None  Procedures:   None  Antimicrobials:   None   Subjective: Patient interviewed and examined along with RN and granddaughter in room.  Patient standing up near sink along with PT.  Overall feels much better with improvement in dyspnea.  Mild DOE present.  No chest pain, dizziness or lightheadedness.  Not hypoxic on room air with activity with PT.  Uses PRN oxygen at home.  Objective: Vitals:    10/17/18 0458 10/17/18 0900 10/17/18 1143 10/17/18 1658  BP: 140/79  121/62 (!) 141/78  Pulse: 68  63 70  Resp: 20  18 18   Temp: (!) 97.3 F (36.3 C)  (!) 97.3 F (36.3 C) 97.6 F (36.4 C)  TempSrc: Oral  Oral Oral  SpO2: 100% 95% 100% 100%  Weight: 51.9 kg       Intake/Output Summary (Last 24 hours) at 10/17/2018 1715 Last data filed at 10/17/2018 1420 Gross per 24 hour  Intake 653 ml  Output 576 ml  Net 77 ml   Filed Weights   10/16/18 0300 10/17/18 0458  Weight: 51.5 kg 51.9 kg    Examination:  General exam: Pleasant elderly female, small built, thinly nourished, standing up comfortably in front of sink with PT supervision. Respiratory system: Few bibasilar crackles.  Otherwise clear to auscultation.  No increased work of breathing. Cardiovascular system: S1 and S2 heard, RRR.  JVD +.  Trace bilateral leg edema.  2/6 short systolic murmur best heard at apex.  Telemetry personally reviewed: Sinus rhythm. Gastrointestinal system: Abdomen is nondistended, soft and nontender. No organomegaly or masses felt. Normal bowel sounds heard.  Stable. Central nervous system: Alert and oriented. No focal neurological deficits.  Stable. Extremities: Symmetric 5 x 5 power.  Data Reviewed: I have personally reviewed following labs and imaging studies  CBC: Recent Labs  Lab 10/12/18 1130 10/15/18 1000 10/17/18 0521  WBC 9.8 6.2 6.4  NEUTROABS 7.3 3.8 3.1  HGB 10.2* 10.2* 9.5*  HCT 32.6* 33.0* 31.1*  MCV 81.5 80.7 79.5*  PLT 231 292 229   Basic Metabolic Panel: Recent Labs  Lab 10/15/18 1000 10/16/18 0558 10/16/18 0748 10/17/18 0521  NA 142 144 145 143  K 4.4 6.2* 5.0 3.9  CL 109 108 110 108  CO2 23 21* 19* 29  GLUCOSE 142* 125* 132* 117*  BUN 46* 49* 49* 46*  CREATININE 3.12* 3.00* 2.82* 3.02*  CALCIUM 8.9 9.0 9.1 8.5*  MG  --   --  2.6* 2.4  PHOS  --   --   --  4.7*   GFR: Estimated Creatinine Clearance: 10.1 mL/min (A) (by C-G formula based on SCr of 3.02 mg/dL  (H)). Liver Function Tests: Recent Labs  Lab 10/15/18 1000 10/17/18 0521  AST 29  --   ALT 18  --   ALKPHOS 65  --   BILITOT 0.8  --   PROT 8.5*  --   ALBUMIN 2.9* 2.5*   Cardiac Enzymes: Recent Labs  Lab 10/15/18 1000 10/15/18 1345 10/15/18 2015  TROPONINI 0.03* 0.03* 0.03*   CBG: Recent Labs  Lab 10/16/18 1203 10/16/18 1706 10/17/18 0732 10/17/18 1139 10/17/18 1642  GLUCAP 86 125* 149* 124* 115*   Urine analysis:    Component Value Date/Time   COLORURINE STRAW (A) 10/15/2018 1938   APPEARANCEUR CLEAR 10/15/2018 1938   LABSPEC 1.010 10/15/2018 1938   PHURINE 6.0 10/15/2018 1938   GLUCOSEU NEGATIVE 10/15/2018 1938   HGBUR NEGATIVE 10/15/2018 1938   BILIRUBINUR NEGATIVE 10/15/2018 1938   KETONESUR NEGATIVE 10/15/2018 1938   PROTEINUR NEGATIVE 10/15/2018 1938   UROBILINOGEN 1.0 09/07/2016 1403   NITRITE NEGATIVE 10/15/2018 1938   LEUKOCYTESUR TRACE (A)  10/15/2018 1938    Recent Results (from the past 240 hour(s))  MRSA PCR Screening     Status: None   Collection Time: 10/15/18  4:46 PM  Result Value Ref Range Status   MRSA by PCR NEGATIVE NEGATIVE Final    Comment:        The GeneXpert MRSA Assay (FDA approved for NASAL specimens only), is one component of a comprehensive MRSA colonization surveillance program. It is not intended to diagnose MRSA infection nor to guide or monitor treatment for MRSA infections. Performed at [facility], [place] [address], [place], [place] [address]          Radiology Studies: Dg Chest 2 View  Result Date: 10/17/2018 CLINICAL DATA:  History of CHF, hypertension, and diabetes. Now with shortness of breath. EXAM: CHEST - 2 VIEW COMPARISON:  Portable chest x-ray of October 15, 2018 FINDINGS: There is increasing density at the right lung base. There small bilateral pleural effusions. That on the right is new. There is patchy increased density at the left lung base which is fairly stable. The heart is mildly  enlarged. The pulmonary vascularity is slightly less engorged than on the previous study. There is dense calcification in the wall of the thoracic aorta. There is multilevel degenerative disc disease of the thoracic spine. IMPRESSION: Increased atelectasis or pneumonia at the right lung base with small right pleural effusion. Fairly stable left basilar atelectasis or pneumonia with small effusion. Decreased pulmonary vascular congestion. Thoracic aortic atherosclerosis. Electronically Signed   By: David  Martinique M.D.   On: 10/17/2018 09:01   US Renal  Result Date: 10/17/2018 CLINICAL DATA:  Acute on chronic renal failure. History of hypertension and diabetes. EXAM: RENAL / URINARY TRACT ULTRASOUND COMPLETE COMPARISON:  Limited splenic ultrasound 08/26/2018. Abdominal CTA 10/19/2017. Chest radiographs 10/17/2018 FINDINGS: Right Kidney: Renal measurements: 9.6 x 3.4 x 5.7 cm = volume: 98.8 mL. There is renal cortical thinning and increased echogenicity. No hydronephrosis or focal cortical lesion. Left Kidney: Renal measurements: 8.8 x 4.9 x 4.5 cm = volume: 100.7 mL. There is renal cortical thinning and increased echogenicity. No hydronephrosis or focal cortical lesion. Bladder: Appears normal for the degree of bladder distention. At least moderate size bilateral pleural effusions are noted. IMPRESSION: 1. Both kidneys are small with cortical thinning and increased echogenicity consistent with chronic medical renal disease. No hydronephrosis. 2. At least moderate bilateral pleural effusions. Electronically Signed   By: Richardean Sale M.D.   On: 10/17/2018 16:25        Scheduled Meds: . acetaminophen  500 mg Oral Daily  . aspirin EC  81 mg Oral Daily  . atorvastatin  10 mg Oral Daily  . calcium-vitamin D  1 tablet Oral Daily  . carvedilol  12.5 mg Oral BID WC  . clopidogrel  75 mg Oral Q breakfast  . heparin  5,000 Units Subcutaneous Q8H  . insulin aspart  0-5 Units Subcutaneous QHS  . insulin  aspart  0-9 Units Subcutaneous TID WC  . pantoprazole  20 mg Oral Daily  . sodium chloride flush  3 mL Intravenous Q12H   Continuous Infusions: . sodium chloride       LOS: 2 days   Vernell Leep, MD, FACP, [facility]. [facility] Pager [phone]  If 7PM-7AM, please contact night-coverage [email or web address] Password [facility] 10/17/2018, 5:31 PM

## 2018-10-17 NOTE — Progress Notes (Signed)
Occupational Therapy Treatment Patient Details Name: [name] MRN: [id number] DOB: [birth year] Today's Date: 10/17/2018    History of present illness  Pt is a 84 y.o. female with PMH including CHF, DM2, CKD4,  who presented to the ED today with c/o generalized weakness x 2 days and nosebleed. Of note: She was admitted to the hospital twice last month and sent home on home oxygen.   OT comments  Pt progressing towards established OT goals. Pt performing grooming and toileting and supervision-Min Guards A for safety. Pt performing hallway distance functional mobility with Min A for correction of LOB; two episodes of LOB and reports her legs "just acted strange" and denies any dizziness. Pt SpO2 ranging from 96%-90% on RA; reports slight SOB. Continue to recommend dc with HHOT and will continue to follow acutely as admitted.     Follow Up Recommendations  Home health OT;Supervision/Assistance - 24 hour    Equipment Recommendations  None recommended by OT    Recommendations for Other Services      Precautions / Restrictions Precautions Precautions: Fall Precaution Comments: watch O2 sats       Mobility Bed Mobility Overal bed mobility: Needs Assistance Bed Mobility: Supine to Sit     Supine to sit: Supervision     General bed mobility comments: supervision for safety no physical assist required  Transfers Overall transfer level: Needs assistance Equipment used: None Transfers: Sit to/from Stand Sit to Stand: Min guard         General transfer comment: min guard for safeyt and balance    Balance Overall balance assessment: Mild deficits observed, not formally tested                                         ADL either performed or assessed with clinical judgement   ADL Overall ADL's : Needs assistance/impaired     Grooming: Set up;Supervision/safety;Standing;Wash/dry face;Wash/dry hands;Oral care Grooming Details (indicate cue type and  reason): supervision for safety and pt able to perform grooming tasks with MIn cues for initating a new task.              Lower Body Dressing: Min guard;Sit to/from stand Lower Body Dressing Details (indicate cue type and reason): Able to adjust socks while sitting at EOB Toilet Transfer: Nature conservation officer;Ambulation Toilet Transfer Details (indicate cue type and reason): Min GUard A for safety. Pt able to power up into standing Toileting- Clothing Manipulation and Hygiene: Supervision/safety;Sit to/from stand Toileting - Clothing Manipulation Details (indicate cue type and reason): supervision for safety and pt performing peri care   Tub/Shower Transfer Details (indicate cue type and reason): Discussed hsower transfer safety with pt and granddaughter. Both verbalized understanding Functional mobility during ADLs: Min guard General ADL Comments: Pt SpO2 dropping to 90 on RA while at sink.      Vision   Vision Assessment?: No apparent visual deficits   Perception     Praxis      Cognition Arousal/Alertness: Awake/alert Behavior During Therapy: WFL for tasks assessed/performed Overall Cognitive Status: History of cognitive impairments - at baseline                                 General Comments: Memory        Exercises     Shoulder Instructions  General Comments grand-daughter present and supportive    Pertinent Vitals/ Pain       Pain Assessment: No/denies pain  Home Living                                          Prior Functioning/Environment              Frequency  Min 2X/week        Progress Toward Goals  OT Goals(current goals can now be found in the care plan section)  Progress towards OT goals: Progressing toward goals  Acute Rehab OT Goals Patient Stated Goal: to go home OT Goal Formulation: With patient Time For Goal Achievement: 10/30/18 Potential to Achieve Goals: Good ADL Goals Pt Will  Perform Grooming: with modified independence;standing Pt Will Perform Lower Body Bathing: with modified independence;sit to/from stand Pt Will Perform Lower Body Dressing: with modified independence;sit to/from stand Pt Will Transfer to Toilet: with modified independence;ambulating;regular height toilet Pt Will Perform Tub/Shower Transfer: Shower transfer;ambulating;shower seat;with modified independence  Plan      Co-evaluation                 AM-PAC PT "6 Clicks" Daily Activity     Outcome Measure   Help from another person eating meals?: None Help from another person taking care of personal grooming?: A Little Help from another person toileting, which includes using toliet, bedpan, or urinal?: A Little Help from another person bathing (including washing, rinsing, drying)?: A Little Help from another person to put on and taking off regular upper body clothing?: None Help from another person to put on and taking off regular lower body clothing?: A Little 6 Click Score: 20    End of Session Equipment Utilized During Treatment: Oxygen  OT Visit Diagnosis: Unsteadiness on feet (R26.81);Muscle weakness (generalized) (M62.81)   Activity Tolerance Patient tolerated treatment well   Patient Left with call bell/phone within reach;with family/visitor present;Other (comment)   Nurse Communication Mobility status        Time: 9030-0923 OT Time Calculation (min): 29 min  Charges: OT General Charges $OT Visit: 1 Visit OT Treatments $Self Care/Home Management : 23-37 mins  [place], OTR/L Acute Rehab Pager: [phone] Office: [place] 10/17/2018, 8:59 AM

## 2018-10-18 DIAGNOSIS — E1122 Type 2 diabetes mellitus with diabetic chronic kidney disease: Secondary | ICD-10-CM

## 2018-10-18 LAB — BASIC METABOLIC PANEL
Anion gap: 10 (ref 5–15)
BUN: 44 mg/dL — ABNORMAL HIGH (ref 8–23)
CO2: 25 mmol/L (ref 22–32)
Calcium: 8.5 mg/dL — ABNORMAL LOW (ref 8.9–10.3)
Chloride: 108 mmol/L (ref 98–111)
Creatinine, Ser: 3.03 mg/dL — ABNORMAL HIGH (ref 0.44–1.00)
GFR calc Af Amer: 15 mL/min — ABNORMAL LOW (ref >60)
GFR calc non Af Amer: 13 mL/min — ABNORMAL LOW (ref >60)
Glucose, Bld: 113 mg/dL — ABNORMAL HIGH (ref 70–99)
Potassium: 3.4 mmol/L — ABNORMAL LOW (ref 3.5–5.1)
Sodium: 143 mmol/L (ref 135–145)

## 2018-10-18 LAB — GLUCOSE, CAPILLARY
Glucose-Capillary: 109 mg/dL — ABNORMAL HIGH (ref 70–99)
Glucose-Capillary: 132 mg/dL — ABNORMAL HIGH (ref 70–99)

## 2018-10-18 MED ORDER — ASPIRIN EC 81 MG PO TBEC: 81.0000 mg | DELAYED_RELEASE_TABLET | Freq: Every day | ORAL | Status: DC

## 2018-10-18 MED ORDER — FUROSEMIDE 40 MG PO TABS: 40.0000 mg | ORAL_TABLET | Freq: Two times a day (BID) | ORAL | Status: DC

## 2018-10-18 MED ORDER — ACETAMINOPHEN 500 MG PO TABS: 500.0000 mg | ORAL_TABLET | Freq: Every day | ORAL | Status: AC

## 2018-10-18 MED ORDER — FUROSEMIDE 40 MG PO TABS: 40.0000 mg | ORAL_TABLET | Freq: Two times a day (BID) | ORAL | 0 refills | Status: DC

## 2018-10-18 MED ORDER — POTASSIUM CHLORIDE CRYS ER 20 MEQ PO TBCR
40.0000 meq | EXTENDED_RELEASE_TABLET | Freq: Once | ORAL | Status: AC
  Administered 2018-10-18: 40 meq via ORAL
  Filled 2018-10-18 (×2): qty 2

## 2018-10-18 NOTE — Progress Notes (Signed)
Granddaughter at bedside and spoke to Dr Algis Liming on phone, plan to discharge today, advised he wanted to start po lasix, she wants to wait until she gets home so accidents

## 2018-10-18 NOTE — Discharge Summary (Signed)
Physician Discharge Summary  [name] XBJ:[id number] DOB: February 05, 1926  PCP: Lucianne Lei, MD  Admit date: 10/15/2018 Discharge date: 10/18/2018  Recommendations for Outpatient Follow-up:  1. Dr. Lucianne Lei, PCP in 5 days with repeat labs (CBC & renal panel). 2. Dr. Lyman Bishop, Cardiology in 1 week.  I communicated with [facility] cardmaster to assist with this TOC follow-up.  Home Health: PT and OT. Equipment/Devices: None  Discharge Condition: Improved and stable but guarded and at high risk for recurrent hospitalization and decline. CODE STATUS: DNR Diet recommendation: Heart healthy and diabetic diet.  Discharge Diagnoses:  Principal Problem:   Acute on chronic systolic CHF (congestive heart failure) (HCC) Active Problems:   Hypertension   Dyslipidemia   ARF (acute renal failure) (HCC)   CKD (chronic kidney disease) stage 4, GFR 15-29 ml/min (HCC)   Shortness of breath   Diabetes (HCC)   CAD (coronary artery disease)   Brief Summary: Alexa College Whitleyis a 84 y.o.femalewith medical history significant forchronic systolic CHF with EF of 25 to 30% and akinesis of the mid apical/apical myocardium, DM2, CKD4, anemia, acute coronary syndrome, hypertension, hyperlipidemia and GERD.  Patient was admitted with generalized weakness, shortness of breath, increasing supplemental oxygen demand, elevated BNP with chest x-ray revealing bilateral pulmonary edema.  Worsening serum creatinine of 3.12 noted on presentation (last serum creatinine was 2.60).  Patient having failure to thrive and this is her fourth hospital admission in the last 6 months.  All for CHF exacerbations.  Palliative care and cardiology have seen her on previous admissions.   Assessment & Plan:   Acute on chronic systolic CHF:  -Last echo was done in October 2019, revealing an EF of 25 to 30%.   -As stated above, IV Lasix had been increased from 40 mg twice daily to 100 mg twice daily.  Volume status  improved but creatinine went up from 2.8-3.02.  Still appeared mildly volume overloaded on 11/18 as evidenced by crackles on lung auscultation, mild JVD elevation and trace leg edema.  Also chest x-ray 11/18 personally reviewed, pulmonary edema is better but has bibasilar atelectasis with small pleural effusions. -Reduce Lasix to 40 mg every 12 hourly.  Not sure if intake output and weights are accurate.  Weight down by only 1 pound and negative 461 mL since admission. -Clinically improved.  Volume status significantly improved.  Transitioned to oral Lasix 40 mg twice daily at discharge.  I discussed in great length with patient's granddaughter and advised her that given her very advanced age, cardiomyopathy, stage IV chronic kidney disease and frequent hospitalizations, she remains at high risk for recurrent decline and hospitalizations.  Advised her to discuss with patient and rest of the extended family regarding considering palliative care option.  She verbalized understanding.  Hypertension: - Continue carvedilol.  -Controlled.  CAD, HLD: -Continue aspirin, Plavix, statin, beta-blocker.  Stable.  Mild AKI on CKD 4 versus stable CKD 4:  -Interestingly patient had normal creatinine of 0.86- 1.04 in January 2019.  Since June however her creatinine has fluctuated in the 1.9- 3 range.  Most recently 2.62 on 10/24.  Presented with creatinine of 3.12.  Acute kidney injury likely cardiorenal.  Reduced IV Lasix as noted above.  Would not be a candidate for RRT given her advanced age.  Renal ultrasound without hydronephrosis and shows chronic medical disease.  Creatinine stable compared to yesterday at 3.03.  I reviewed her case with nephrologist on-call who indicated that her current creatinine may be her baseline and  agreed that she would not be a candidate for RRT and also agreed with current increased dose of Lasix at discharge.  DM2;not on insulin at home -Continue prior home dose of glipizide.   However caution given stage IV chronic kidney disease and if has hypoglycemic episodes, may have to reduce dose or discontinue completely.  A1c on 10/23: 5.8.  GERD -Continue Protonix  Microcytic anemia Could be related to iron deficiency versus chronic kidney disease.  Stable.  Outpatient follow-up.  Adult failure to thrive Palliative care has consulted during prior hospitalization.  Extensive discussion with granddaughter as indicated above.  Anxiety Patient transiently was not her usual self this morning.  Granddaughter then arrived and indicated that patient usually takes her hydroxyzine twice daily in the morning and at bedtime.  She was given hydroxyzine with improvement of her mental status back to baseline.  Also suspect patient has some degree of underlying dementia.  Hypokalemia Replaced prior to discharge.  Follow [facility] as outpatient.  Consultants:   None  Procedures:   None   Discharge Instructions  Discharge Instructions    (HEART FAILURE PATIENTS) Call MD:  Anytime you have any of the following symptoms: 1) 3 pound weight gain in 24 hours or 5 pounds in 1 week 2) shortness of breath, with or without a dry hacking cough 3) swelling in the hands, feet or stomach 4) if you have to sleep on extra pillows at night in order to breathe.   Complete by:  As directed    Call MD for:  difficulty breathing, headache or visual disturbances   Complete by:  As directed    Call MD for:  extreme fatigue   Complete by:  As directed    Call MD for:  persistant dizziness or light-headedness   Complete by:  As directed    Diet - low sodium heart healthy   Complete by:  As directed    Diet Carb Modified   Complete by:  As directed    Increase activity slowly   Complete by:  As directed        Medication List    TAKE these medications   ACCU-CHEK GUIDE w/Device Kit See admin instructions.   acetaminophen 500 MG tablet Commonly known as:  TYLENOL Take 1 tablet (500 mg  total) by mouth daily. What changed:    medication strength  how much to take  when to take this  reasons to take this   aspirin EC 81 MG tablet Take 1 tablet (81 mg total) by mouth daily. What changed:    medication strength  how much to take   atorvastatin 10 MG tablet Commonly known as:  LIPITOR Take 1 tablet (10 mg total) by mouth daily.   CALCIUM 500 + D 500-125 MG-UNIT Tabs Generic drug:  Calcium Carbonate-Vitamin D Take 1 tablet by mouth daily. Gummie   carvedilol 12.5 MG tablet Commonly known as:  COREG Take 1 tablet (12.5 mg total) by mouth 2 (two) times daily with a meal.   clopidogrel 75 MG tablet Commonly known as:  PLAVIX Take 1 tablet (75 mg total) by mouth daily with breakfast.   furosemide 40 MG tablet Commonly known as:  LASIX Take 1 tablet (40 mg total) by mouth 2 (two) times daily. What changed:  when to take this   glipiZIDE 5 MG tablet Commonly known as:  GLUCOTROL Take 0.5 tablets (2.5 mg total) by mouth 2 (two) times daily before a meal. Always take with food  hydrOXYzine 10 MG tablet Commonly known as:  ATARAX/VISTARIL Take 1 tablet (10 mg total) by mouth 3 (three) times daily as needed for anxiety.   pantoprazole 20 MG tablet Commonly known as:  PROTONIX Take 1 tablet (20 mg total) by mouth daily.   polyethylene glycol packet Commonly known as:  MIRALAX / GLYCOLAX Take 17 g by mouth daily as needed (for constipation.).      Follow-up Information    Lucianne Lei, MD. Schedule an appointment as soon as possible for a visit in 5 days.   Specialty:  Family Medicine Why:  To be seen with repeat labs (CBC & renal panel).Left message for office to call patient Contact information: [address] [place] [address] [phone]        Pixie Casino, MD. Schedule an appointment as soon as possible for a visit in 1 week(s).   Specialty:  Cardiology Contact information: [address] [place]  [address] [phone]          No Known Allergies    Procedures/Studies: Dg Chest 2 View  Result Date: 10/17/2018 CLINICAL DATA:  History of CHF, hypertension, and diabetes. Now with shortness of breath. EXAM: CHEST - 2 VIEW COMPARISON:  Portable chest x-ray of October 15, 2018 FINDINGS: There is increasing density at the right lung base. There small bilateral pleural effusions. That on the right is new. There is patchy increased density at the left lung base which is fairly stable. The heart is mildly enlarged. The pulmonary vascularity is slightly less engorged than on the previous study. There is dense calcification in the wall of the thoracic aorta. There is multilevel degenerative disc disease of the thoracic spine. IMPRESSION: Increased atelectasis or pneumonia at the right lung base with small right pleural effusion. Fairly stable left basilar atelectasis or pneumonia with small effusion. Decreased pulmonary vascular congestion. Thoracic aortic atherosclerosis. Electronically Signed   By: David  Martinique M.D.   On: 10/17/2018 09:01   Dg Chest 2 View  Result Date: 10/15/2018 CLINICAL DATA:  Weakness and fatigue for 1 week. Constant mild shortness of breath on home oxygen. History of diabetes and hypertension. EXAM: CHEST - 2 VIEW COMPARISON:  09/22/2018 and 09/20/2018. FINDINGS: Stable mild cardiomegaly and diffuse aortic atherosclerosis. There are left-greater-than-right pleural effusions with associated bibasilar pulmonary opacities and mild edema. The left pleural effusion has enlarged compared with the previous study. The aeration of the right lung base has improved. There is no pneumothorax or acute osseous abnormality. Telemetry leads overlie the chest. IMPRESSION: Fluctuating pulmonary edema, pleural effusions and bibasilar pulmonary opacities, likely congestive heart failure and associated basilar atelectasis. The edema has mildly improved from previous study 3 weeks ago.  Electronically Signed   By: Richardean Sale M.D.   On: 10/15/2018 10:56   US Renal  Result Date: 10/17/2018 CLINICAL DATA:  Acute on chronic renal failure. History of hypertension and diabetes. EXAM: RENAL / URINARY TRACT ULTRASOUND COMPLETE COMPARISON:  Limited splenic ultrasound 08/26/2018. Abdominal CTA 10/19/2017. Chest radiographs 10/17/2018 FINDINGS: Right Kidney: Renal measurements: 9.6 x 3.4 x 5.7 cm = volume: 98.8 mL. There is renal cortical thinning and increased echogenicity. No hydronephrosis or focal cortical lesion. Left Kidney: Renal measurements: 8.8 x 4.9 x 4.5 cm = volume: 100.7 mL. There is renal cortical thinning and increased echogenicity. No hydronephrosis or focal cortical lesion. Bladder: Appears normal for the degree of bladder distention. At least moderate size bilateral pleural effusions are noted. IMPRESSION: 1. Both kidneys  are small with cortical thinning and increased echogenicity consistent with chronic medical renal disease. No hydronephrosis. 2. At least moderate bilateral pleural effusions. Electronically Signed   By: Richardean Sale M.D.   On: 10/17/2018 16:25    Subjective: Denies dyspnea, chest pain, dizziness or lightheadedness.  Ambulated with PT.  Not hypoxic.  Subsequently per granddaughter, patient appeared anxious which improved after a dose of hydroxyzine and mental status back to baseline.  Granddaughter has no other concerns.  Discharge Exam:  Vitals:   10/18/18 0357 10/18/18 0828 10/18/18 0955 10/18/18 1216  BP: 134/64 138/78 (!) 145/86 (!) 112/58  Pulse: 69 64 79 64  Resp: _0 Temp: (!) 97.4 F (36.3 C)  (!) 97.5 F (36.4 C) 97.8 F (36.6 C)  TempSrc: Oral  Oral Oral  SpO2: 100%  100% 99%  Weight: 51.3 kg       General exam: Pleasant elderly female, small built, thinly nourished, sitting up comfortably at edge of bed. Respiratory system:  Slightly diminished breath sounds in the bases with occasional basal crackles.  Rest of lung  fields clear to auscultation.  No increased work of breathing.  Cardiovascular system: S1 and S2 heard, RRR.    No JVD.    No pedal edema.  2/6 short systolic murmur best heard at apex.  Telemetry personally reviewed: Sinus rhythm. Gastrointestinal system: Abdomen is nondistended, soft and nontender. No organomegaly or masses felt. Normal bowel sounds heard. Central nervous system: Alert and oriented x2. No focal neurological deficits.  Extremities: Symmetric 5 x 5 power.   The results of significant diagnostics from this hospitalization (including imaging, microbiology, ancillary and laboratory) are listed below for reference.     Microbiology: Recent Results (from the past 240 hour(s))  MRSA PCR Screening     Status: None   Collection Time: 10/15/18  4:46 PM  Result Value Ref Range Status   MRSA by PCR NEGATIVE NEGATIVE Final    Comment:        The GeneXpert MRSA Assay (FDA approved for NASAL specimens only), is one component of a comprehensive MRSA colonization surveillance program. It is not intended to diagnose MRSA infection nor to guide or monitor treatment for MRSA infections. Performed at [facility], [place] [address] [place], [place] [address]      Labs: CBC: Recent Labs  Lab 10/12/18 1130 10/15/18 1000 10/17/18 0521  WBC 9.8 6.2 6.4  NEUTROABS 7.3 3.8 3.1  HGB 10.2* 10.2* 9.5*  HCT 32.6* 33.0* 31.1*  MCV 81.5 80.7 79.5*  PLT 231 292 121   Basic Metabolic Panel: Recent Labs  Lab 10/15/18 1000 10/16/18 0558 10/16/18 0748 10/17/18 0521 10/18/18 0427  NA 142 144 145 143 143  K 4.4 6.2* 5.0 3.9 3.4*  CL 109 108 110 108 108  CO2 23 21* 19* 29 25  GLUCOSE 142* 125* 132* 117* 113*  BUN 46* 49* 49* 46* 44*  CREATININE 3.12* 3.00* 2.82* 3.02* 3.03*  CALCIUM 8.9 9.0 9.1 8.5* 8.5*  MG  --   --  2.6* 2.4  --   PHOS  --   --   --  4.7*  --    Liver Function Tests: Recent Labs  Lab 10/15/18 1000 10/17/18 0521  AST 29  --   ALT 18  --   ALKPHOS 65   --   BILITOT 0.8  --   PROT 8.5*  --   ALBUMIN 2.9* 2.5*   BNP (last 3 results) Recent  Labs    09/09/18 1419 09/15/18 0738 10/15/18 1000  BNP 676.4* 398.0* 2,046.5*   Cardiac Enzymes: Recent Labs  Lab 10/15/18 1000 10/15/18 1345 10/15/18 2015  TROPONINI 0.03* 0.03* 0.03*   CBG: Recent Labs  Lab 10/17/18 1139 10/17/18 1642 10/17/18 2119 10/18/18 0747 10/18/18 1124  GLUCAP 124* 115* 139* 109* 132*    Urinalysis    Component Value Date/Time   COLORURINE STRAW (A) 10/15/2018 1938   APPEARANCEUR CLEAR 10/15/2018 1938   LABSPEC 1.010 10/15/2018 1938   PHURINE 6.0 10/15/2018 1938   GLUCOSEU NEGATIVE 10/15/2018 1938   HGBUR NEGATIVE 10/15/2018 1938   BILIRUBINUR NEGATIVE 10/15/2018 1938   KETONESUR NEGATIVE 10/15/2018 1938   PROTEINUR NEGATIVE 10/15/2018 1938   UROBILINOGEN 1.0 09/07/2016 1403   NITRITE NEGATIVE 10/15/2018 1938   LEUKOCYTESUR TRACE (A) 10/15/2018 1938    I discussed in detail with patient's granddaughter, updated care and answered questions.  Time coordinating discharge: 40 minutes  SIGNED:  Vernell Leep, MD, FACP, [facility]. [facility] Pager [phone]  If 7PM-7AM, please contact night-coverage [email or web address] Password [email or web address] 10/18/2018, 1:33 PM

## 2018-10-18 NOTE — Plan of Care (Signed)
  Problem: Education: Goal: Knowledge of General Education information will improve Description Including pain rating scale, medication(s)/side effects and non-pharmacologic comfort measures Outcome: Progressing   Problem: Health Behavior/Discharge Planning: Goal: Ability to manage health-related needs will improve Outcome: Progressing   Problem: Clinical Measurements: Goal: Ability to maintain clinical measurements within normal limits will improve Outcome: Progressing Goal: Will remain free from infection Outcome: Progressing Goal: Diagnostic test results will improve Outcome: Progressing Goal: Respiratory complications will improve Outcome: Progressing Goal: Cardiovascular complication will be avoided Outcome: Progressing   Problem: Activity: Goal: Risk for activity intolerance will decrease Outcome: Progressing   Problem: Elimination: Goal: Will not experience complications related to bowel motility Outcome: Progressing Goal: Will not experience complications related to urinary retention Outcome: Progressing   Problem: Safety: Goal: Ability to remain free from injury will improve Outcome: Progressing   Problem: Skin Integrity: Goal: Risk for impaired skin integrity will decrease Outcome: Progressing   Problem: Education: Goal: Ability to demonstrate management of disease process will improve Outcome: Progressing Goal: Ability to verbalize understanding of medication therapies will improve Outcome: Progressing Goal: Individualized Educational Video(s) Outcome: Progressing   Problem: Activity: Goal: Capacity to carry out activities will improve Outcome: Progressing   Problem: Cardiac: Goal: Ability to achieve and maintain adequate cardiopulmonary perfusion will improve Outcome: Progressing

## 2018-10-18 NOTE — Progress Notes (Signed)
Physical Therapy Treatment Patient Details Name: [name] MRN: [id number] DOB: 08-22-1926 Today's Date: 10/18/2018    History of Present Illness Pt is a 84 y.o. female with PMH of CHF, DM2, CKD4, admitted 10/13/18 with c/o generalized weakness x2 days and nosebleed. CXR with bilateral pulmonary edema. Worked up for CHF. Of note, pt admitted twice last month and sent home on O2.   PT Comments    Pt with increased fatigue and confusion this morning compared to yesterday's treatment session with OT. Only oriented to self; declining medications and mobility until granddaughter present. Pt able to ambulate in room with minA and HHA to maintain balance. SOB only noted when pt seemed anxious about conversation topic (I.e. Not knowing where granddaughter is). SpO2 97% at rest, up to 98% with ambulation on RA.    Follow Up Recommendations  Home health PT;Supervision for mobility/OOB     Equipment Recommendations  None recommended by PT    Recommendations for Other Services       Precautions / Restrictions Precautions Precautions: Fall Restrictions Weight Bearing Restrictions: No    Mobility  Bed Mobility Overal bed mobility: Modified Independent             General bed mobility comments: Pt sitting EOB upon arrival. Layed down and sat up multiple times during session without physical assist  Transfers Overall transfer level: Needs assistance Equipment used: 1 person hand held assist Transfers: Sit to/from Stand Sit to Stand: Min assist         General transfer comment: Pt confused and declining mobility. Eventually requiring minA and HHA to assist; min guard for sitting on recliner, poor eccentric control  Ambulation/Gait Ambulation/Gait assistance: Min assist Gait Distance (Feet): 10 Feet Assistive device: 1 person hand held assist Gait Pattern/deviations: Step-through pattern;Decreased stride length;Narrow base of support;Trunk flexed Gait velocity:  Decreased Gait velocity interpretation: <1.8 ft/sec, indicate of risk for recurrent falls General Gait Details: Only agreeable to amb in room, minA and HHA to maintain balance; SpO2 98% on RA   Stairs             Wheelchair Mobility    Modified Rankin (Stroke Patients Only)       Balance Overall balance assessment: Mild deficits observed, not formally tested                                          Cognition Arousal/Alertness: Awake/alert Behavior During Therapy: Flat affect Overall Cognitive Status: History of cognitive impairments - at baseline Area of Impairment: Orientation;Attention;Memory;Following commands;Awareness;Problem solving                 Orientation Level: Disoriented to;Place;Time;Situation Current Attention Level: Sustained Memory: Decreased short-term memory Following Commands: Follows one step commands inconsistently   Awareness: Intellectual Problem Solving: Slow processing;Requires verbal cues General Comments: Pt with h/o memory impairments at baseline. Pt with increased confusion and slowed processing this morning compared to yesterday's treatment session with OT. Stating "I don't know where I am... I just need to figure this out... where is my granddaughter"; refused meds with RN until granddaughter present despite education      Exercises      General Comments        Pertinent Vitals/Pain Pain Assessment: No/denies pain    Home Living  Prior Function            PT Goals (current goals can now be found in the care plan section) Acute Rehab PT Goals Patient Stated Goal: to go home PT Goal Formulation: With patient/family Time For Goal Achievement: 10/30/18 Potential to Achieve Goals: Good Progress towards PT goals: Not progressing toward goals - comment(Limited by fatigue/confusion)    Frequency    Min 3X/week      PT Plan Current plan remains appropriate     Co-evaluation              AM-PAC PT "6 Clicks" Daily Activity  Outcome Measure  Difficulty turning over in bed (including adjusting bedclothes, sheets and blankets)?: None Difficulty moving from lying on back to sitting on the side of the bed? : None Difficulty sitting down on and standing up from a chair with arms (e.g., wheelchair, bedside commode, etc,.)?: Unable Help needed moving to and from a bed to chair (including a wheelchair)?: A Little Help needed walking in hospital room?: A Little Help needed climbing 3-5 steps with a railing? : A Little 6 Click Score: 18    End of Session Equipment Utilized During Treatment: Gait belt Activity Tolerance: Patient limited by fatigue Patient left: in chair;with call bell/phone within reach;with chair alarm set Nurse Communication: Mobility status PT Visit Diagnosis: Unsteadiness on feet (R26.81);Muscle weakness (generalized) (M62.81)     Time: 7564-3329 PT Time Calculation (min) (ACUTE ONLY): 16 min  Charges:  $Therapeutic Activity: 8-22 mins                    Mabeline Caras, PT, DPT Acute Rehabilitation Services  Pager [phone] Office [place] 10/18/2018, 9:16 AM

## 2018-10-18 NOTE — Progress Notes (Signed)
Pt up to chair with PT, Dr Algis Liming also in to see pt, pt reports she is feeling down and missing family, called Lajuana Matte and phone picked up but never answer. Called 2nd daughter and left message

## 2018-10-18 NOTE — Discharge Instructions (Signed)

## 2018-10-18 NOTE — Progress Notes (Signed)
SATURATION QUALIFICATIONS: (This note is used to comply with regulatory documentation for home oxygen)  Patient Saturations on Room Air at Rest = 97%  Patient Saturations on Room Air while Ambulating = 98%  Patient Saturations on - Liters of oxygen while Ambulating = N/A  Saturation qualifications note entered per MD request. Pt maintaining SpO2 97-98% on room air at rest and with ambulation.  Mabeline Caras, PT, DPT Acute Rehabilitation Services  Pager [phone] Office [phone]

## 2018-10-18 NOTE — Care Management Note (Signed)
Case Management Note  Patient Details  Name: [name] MRN: [id number] Date of Birth: 1926-03-25  Subjective/Objective:  CHF                Action/Plan: Patient lives at home; PCP is Dr Beatrix Shipper; has private insurance with [facility] with prescription drug coverage; she is active with [facility] as prior to admission; DME - home oxygen through [place], walker and cane at home.  Expected Discharge Date:  10/18/18               Expected Discharge Plan:  [place]  In-House Referral:   [facility]  Discharge planning Services  CM Consult  Choice offered to:  Patient, Adult Children  HH Arranged:  RN, Disease Management, PT, OT [place] Agency:  [place]  Status of Service:  In process, will continue to follow  Sherrilyn Rist [phone] 10/18/2018, 2:44 PM

## 2018-10-18 NOTE — Progress Notes (Signed)
Occupational Therapy Treatment Patient Details Name: [name] MRN: [id number] DOB: 03-12-1926 Today's Date: 10/18/2018    History of present illness Pt is a 84 y.o. female with PMH of CHF, DM2, CKD4, admitted 10/13/18 with c/o generalized weakness x2 days and nosebleed. CXR with bilateral pulmonary edema. Worked up for CHF. Of note, pt admitted twice last month and sent home on O2.   OT comments  Pt presenting with increased fatigue and stating "I just don't feel right." Pt requiring increased encouragement for participation in session. Required Min A and single hand held A for functional mobility. Pt requiring increased cues to participate in self feeding for breakfast. Pt continues to mention different family members and that she wants her granddaughter to return; "I don't know why they left me." SpO2 ranging between 97-98% on RA throughout session. Continue to recommend dc home with HHOT and will continue to follow acutely as admitted.    Follow Up Recommendations  Home health OT;Supervision/Assistance - 24 hour    Equipment Recommendations  None recommended by OT    Recommendations for Other Services      Precautions / Restrictions Precautions Precautions: Fall Precaution Comments: watch O2 sats Restrictions Weight Bearing Restrictions: No       Mobility Bed Mobility Overal bed mobility: Modified Independent             General bed mobility comments: Pt sitting EOB upon arrival. Layed down and sat up multiple times during session without physical assist  Transfers Overall transfer level: Needs assistance Equipment used: 1 person hand held assist Transfers: Sit to/from Stand Sit to Stand: Min assist         General transfer comment: Pt confused and declining mobility. Eventually requiring minA and HHA to assist; min guard for sitting on recliner, poor eccentric control    Balance Overall balance assessment: Mild deficits observed, not formally tested                                          ADL either performed or assessed with clinical judgement   ADL Overall ADL's : Needs assistance/impaired Eating/Feeding: Set up;Supervision/ safety;Sitting(cues for participation) Eating/Feeding Details (indicate cue type and reason): Pt requiring cues for participation and presenting with decreased desire to eat.                      Toilet Transfer: Minimal assistance;Ambulation(simulated to recliner)           Functional mobility during ADLs: Minimal assistance General ADL Comments: SpO2 staying at 98-96%     Vision   Vision Assessment?: No apparent visual deficits   Perception     Praxis      Cognition Arousal/Alertness: Awake/alert Behavior During Therapy: Flat affect Overall Cognitive Status: History of cognitive impairments - at baseline Area of Impairment: Orientation;Attention;Memory;Following commands;Awareness;Problem solving                 Orientation Level: Disoriented to;Place;Time;Situation Current Attention Level: Sustained Memory: Decreased short-term memory Following Commands: Follows one step commands inconsistently   Awareness: Intellectual Problem Solving: Slow processing;Requires verbal cues General Comments: Upon arrival, pt sitting at EOB with PT. Pt reporting that she feels poorly and doesn't know why. Pt stating "I just don't feel right." Noting increased breathing rate when discussing family and pt stating "I want my granddaughter"        Exercises  Shoulder Instructions       General Comments Family not present and pt continued to talk about different family members    Pertinent Vitals/ Pain       Pain Assessment: No/denies pain  Home Living                                          Prior Functioning/Environment              Frequency  Min 2X/week        Progress Toward Goals  OT Goals(current goals can now be found in the  care plan section)  Progress towards OT goals: Not progressing toward goals - comment(Pt feeling sad and requiring increased encouragement)  Acute Rehab OT Goals Patient Stated Goal: to go home OT Goal Formulation: With patient Time For Goal Achievement: 10/30/18 Potential to Achieve Goals: Good ADL Goals Pt Will Perform Grooming: with modified independence;standing Pt Will Perform Lower Body Bathing: with modified independence;sit to/from stand Pt Will Perform Lower Body Dressing: with modified independence;sit to/from stand Pt Will Transfer to Toilet: with modified independence;ambulating;regular height toilet Pt Will Perform Tub/Shower Transfer: Shower transfer;ambulating;shower seat;with modified independence  Plan Discharge plan remains appropriate    Co-evaluation                 AM-PAC PT "6 Clicks" Daily Activity     Outcome Measure   Help from another person eating meals?: None Help from another person taking care of personal grooming?: A Little Help from another person toileting, which includes using toliet, bedpan, or urinal?: A Little Help from another person bathing (including washing, rinsing, drying)?: A Little Help from another person to put on and taking off regular upper body clothing?: None Help from another person to put on and taking off regular lower body clothing?: A Little 6 Click Score: 20    End of Session Equipment Utilized During Treatment: Gait belt  OT Visit Diagnosis: Unsteadiness on feet (R26.81);Muscle weakness (generalized) (M62.81)   Activity Tolerance Patient tolerated treatment well   Patient Left with call bell/phone within reach;with chair alarm set;in chair   Nurse Communication Mobility status        Time: 6967-8938 OT Time Calculation (min): 20 min  Charges: OT General Charges $OT Visit: 1 Visit OT Treatments $Self Care/Home Management : 8-22 mins  [place], OTR/L Acute Rehab Pager:  [phone] Office: [place] 10/18/2018, 11:26 AM

## 2018-10-18 NOTE — Progress Notes (Signed)
Pt refuses morning meds. RN attempted to give meds with applesauce. Pt still refuses. Pt states, "not yet, I want to lay down." Anglea, RN made aware.

## 2018-10-19 ENCOUNTER — Ambulatory Visit: Payer: Medicare Other | Admitting: Podiatry

## 2018-10-19 ENCOUNTER — Other Ambulatory Visit: Payer: Self-pay | Admitting: *Deleted

## 2018-10-19 DIAGNOSIS — Z8673 Personal history of transient ischemic attack (TIA), and cerebral infarction without residual deficits: Secondary | ICD-10-CM | POA: Diagnosis not present

## 2018-10-19 DIAGNOSIS — I130 Hypertensive heart and chronic kidney disease with heart failure and stage 1 through stage 4 chronic kidney disease, or unspecified chronic kidney disease: Secondary | ICD-10-CM | POA: Diagnosis not present

## 2018-10-19 DIAGNOSIS — D500 Iron deficiency anemia secondary to blood loss (chronic): Secondary | ICD-10-CM | POA: Diagnosis not present

## 2018-10-19 DIAGNOSIS — Z7982 Long term (current) use of aspirin: Secondary | ICD-10-CM | POA: Diagnosis not present

## 2018-10-19 DIAGNOSIS — E785 Hyperlipidemia, unspecified: Secondary | ICD-10-CM | POA: Diagnosis not present

## 2018-10-19 DIAGNOSIS — E1151 Type 2 diabetes mellitus with diabetic peripheral angiopathy without gangrene: Secondary | ICD-10-CM | POA: Diagnosis not present

## 2018-10-19 DIAGNOSIS — N184 Chronic kidney disease, stage 4 (severe): Secondary | ICD-10-CM | POA: Diagnosis not present

## 2018-10-19 DIAGNOSIS — Z89412 Acquired absence of left great toe: Secondary | ICD-10-CM | POA: Diagnosis not present

## 2018-10-19 DIAGNOSIS — D631 Anemia in chronic kidney disease: Secondary | ICD-10-CM | POA: Diagnosis not present

## 2018-10-19 DIAGNOSIS — E1122 Type 2 diabetes mellitus with diabetic chronic kidney disease: Secondary | ICD-10-CM | POA: Diagnosis not present

## 2018-10-19 DIAGNOSIS — I5042 Chronic combined systolic (congestive) and diastolic (congestive) heart failure: Secondary | ICD-10-CM | POA: Diagnosis not present

## 2018-10-19 DIAGNOSIS — Z7984 Long term (current) use of oral hypoglycemic drugs: Secondary | ICD-10-CM | POA: Diagnosis not present

## 2018-10-19 DIAGNOSIS — I429 Cardiomyopathy, unspecified: Secondary | ICD-10-CM | POA: Diagnosis not present

## 2018-10-19 DIAGNOSIS — I2510 Atherosclerotic heart disease of native coronary artery without angina pectoris: Secondary | ICD-10-CM | POA: Diagnosis not present

## 2018-10-19 DIAGNOSIS — Z7902 Long term (current) use of antithrombotics/antiplatelets: Secondary | ICD-10-CM | POA: Diagnosis not present

## 2018-10-19 DIAGNOSIS — I083 Combined rheumatic disorders of mitral, aortic and tricuspid valves: Secondary | ICD-10-CM | POA: Diagnosis not present

## 2018-10-19 DIAGNOSIS — K219 Gastro-esophageal reflux disease without esophagitis: Secondary | ICD-10-CM | POA: Diagnosis not present

## 2018-10-19 DIAGNOSIS — M1990 Unspecified osteoarthritis, unspecified site: Secondary | ICD-10-CM | POA: Diagnosis not present

## 2018-10-19 DIAGNOSIS — I700 Atherosclerosis of aorta: Secondary | ICD-10-CM | POA: Diagnosis not present

## 2018-10-19 DIAGNOSIS — Z95820 Peripheral vascular angioplasty status with implants and grafts: Secondary | ICD-10-CM | POA: Diagnosis not present

## 2018-10-19 NOTE — Patient Outreach (Signed)
[facility]) Care Management  10/19/2018  LACORA FOLMER [birth year] [id number]   New referral received from hospital liaison as member was recently admitted to hospital for heart failure.  This makes 4th admission since October this year.  Primary MD office will complete transition of care assessment.  Per chart, she has history of stroke, hypertension, PAD, ACS, diabetes, CKD, and dyslipidemia.  Noted that granddaughter Peggye Form is contact person, confirmed during hospitalization.  Call placed to [facility], she report this is not a good time to talk as she is just leaving a funeral.  She state she will call this care manager back.  Will await call back, if no call back will follow up within the next 4 business days.  Valente David, [place], MSN [place] [phone]

## 2018-10-21 ENCOUNTER — Ambulatory Visit (INDEPENDENT_AMBULATORY_CARE_PROVIDER_SITE_OTHER): Payer: Medicare Other | Admitting: Vascular Surgery

## 2018-10-21 ENCOUNTER — Encounter (INDEPENDENT_AMBULATORY_CARE_PROVIDER_SITE_OTHER): Payer: Self-pay | Admitting: Vascular Surgery

## 2018-10-21 ENCOUNTER — Ambulatory Visit (INDEPENDENT_AMBULATORY_CARE_PROVIDER_SITE_OTHER): Payer: Medicare Other

## 2018-10-21 VITALS — BP 111/68 | HR 68 | Resp 17 | Ht 65.5 in | Wt 115.0 lb

## 2018-10-21 DIAGNOSIS — I70299 Other atherosclerosis of native arteries of extremities, unspecified extremity: Secondary | ICD-10-CM | POA: Diagnosis not present

## 2018-10-21 DIAGNOSIS — I739 Peripheral vascular disease, unspecified: Secondary | ICD-10-CM | POA: Diagnosis not present

## 2018-10-21 DIAGNOSIS — E1122 Type 2 diabetes mellitus with diabetic chronic kidney disease: Secondary | ICD-10-CM

## 2018-10-21 DIAGNOSIS — L97909 Non-pressure chronic ulcer of unspecified part of unspecified lower leg with unspecified severity: Secondary | ICD-10-CM | POA: Diagnosis not present

## 2018-10-21 DIAGNOSIS — I10 Essential (primary) hypertension: Secondary | ICD-10-CM | POA: Diagnosis not present

## 2018-10-21 DIAGNOSIS — N184 Chronic kidney disease, stage 4 (severe): Secondary | ICD-10-CM | POA: Diagnosis not present

## 2018-10-21 NOTE — Assessment & Plan Note (Signed)
ABIs today are stable to slightly improved at 0.74 on the right and 1.03 on the left.  Her waveforms are pretty good on the left and fair on the right.  Overall doing well.  Continue aspirin and statin agent.  Recheck in 1 year

## 2018-10-21 NOTE — Progress Notes (Signed)
MRN : [id number]  [name] is a 84 y.o. (11-Jan-1926) female who presents with chief complaint of  Chief Complaint  Patient presents with  . Follow-up    6 month ABI f/u  .  History of Present Illness: Patient returns today in follow up of her PAD.  She is doing well.  She has previously had ulcerations on the left leg and had a toe amputation but all have healed.  No current complaints today other than some soreness in her legs from arthritis.  ABIs today are stable to slightly improved at 0.74 on the right and 1.03 on the left.  Her waveforms are pretty good on the left and fair on the right.  Current Outpatient Medications  Medication Sig Dispense Refill  . aspirin EC 81 MG tablet Take 1 tablet (81 mg total) by mouth daily.    Marland Kitchen atorvastatin (LIPITOR) 10 MG tablet Take 1 tablet (10 mg total) by mouth daily. 30 tablet 1  . Blood Glucose Monitoring Suppl (ACCU-CHEK GUIDE) w/Device KIT See admin instructions.  0  . Calcium Carbonate-Vitamin D (CALCIUM 500 + D) 500-125 MG-UNIT TABS Take 1 tablet by mouth daily. Gummie    . carvedilol (COREG) 12.5 MG tablet Take 1 tablet (12.5 mg total) by mouth 2 (two) times daily with a meal. 60 tablet 2  . clopidogrel (PLAVIX) 75 MG tablet Take 1 tablet (75 mg total) by mouth daily with breakfast. 30 tablet 2  . furosemide (LASIX) 40 MG tablet Take 1 tablet (40 mg total) by mouth 2 (two) times daily. 60 tablet 0  . glipiZIDE (GLUCOTROL) 5 MG tablet Take 0.5 tablets (2.5 mg total) by mouth 2 (two) times daily before a meal. Always take with food 60 tablet 2  . hydrOXYzine (ATARAX/VISTARIL) 10 MG tablet Take 1 tablet (10 mg total) by mouth 3 (three) times daily as needed for anxiety. 30 tablet 0  . pantoprazole (PROTONIX) 20 MG tablet Take 1 tablet (20 mg total) by mouth daily. 30 tablet 1  . polyethylene glycol (MIRALAX / GLYCOLAX) packet Take 17 g by mouth daily as needed (for constipation.).     Marland Kitchen acetaminophen (TYLENOL) 500 MG tablet Take 1  tablet (500 mg total) by mouth daily. (Patient not taking: Reported on 10/21/2018)     No current facility-administered medications for this visit.     Past Medical History:  Diagnosis Date  . ACS (acute coronary syndrome) ([place]) 08/2018  . Anemia    family unsure of this history  . Arthritis   . CHF (congestive heart failure) ([place])   . Chronic ulcer of calf (HCC)    Left  . Constipation   . Diabetes mellitus without complication ([place])   . Dyslipidemia   . Dyspnea   . GERD (gastroesophageal reflux disease)   . Hyperlipidemia   . Hypertension   . Peripheral vascular disease [facility])     Past Surgical History:  Procedure Laterality Date  . ABDOMINAL AORTOGRAM W/LOWER EXTREMITY Left 12/27/2017   Procedure: ABDOMINAL AORTOGRAM W/LOWER EXTREMITY;  Surgeon: Algernon Huxley, MD;  Location: [place];  Service: Cardiovascular;  Laterality: Left;  . ABDOMINAL HYSTERECTOMY    . AMPUTATION TOE Left 12/23/2017   Procedure: AMPUTATION TOE LEFT GREAT TOE;  Surgeon: Sharlotte Alamo, DPM;  Location: [facility];  Service: Podiatry;  Laterality: Left;  . ENDARTERECTOMY FEMORAL Left 09/08/2017   Procedure: ENDARTERECTOMY FEMORAL WITH CORMATRIX PATCH;  Surgeon: Algernon Huxley, MD;  Location: [facility];  Service: Vascular;  Laterality: Left;  . EYE SURGERY Bilateral    Cataract Extraction with IOL  . LOWER EXTREMITY ANGIOGRAPHY Left 07/22/2017   Procedure: Lower Extremity Angiography;  Surgeon: Algernon Huxley, MD;  Location: [place];  Service: Cardiovascular;  Laterality: Left;  . LOWER EXTREMITY ANGIOGRAPHY Left 08/16/2017   Procedure: Lower Extremity Angiography;  Surgeon: Algernon Huxley, MD;  Location: [facility];  Service: Cardiovascular;  Laterality: Left;  Marland Kitchen MULTIPLE TOOTH EXTRACTIONS      Social History Social History   Tobacco Use  . Smoking status: Never Smoker  . Smokeless tobacco: Never Used  Substance Use Topics  . Alcohol use: No  . Drug use: No     Family  History Family History  Problem Relation Age of Onset  . Hypertension Father      No Known Allergies   REVIEW OF SYSTEMS(Negative unless checked)  Constitutional: [] Weight loss[] Fever[] Chills Cardiac:[] Chest pain[] Chest pressure[] Palpitations [] Shortness of breath when laying flat [] Shortness of breath at rest [] Shortness of breath with exertion. Vascular: [] Pain in legs with walking[] Pain in legsat rest[] Pain in legs when laying flat [] Claudication [] Pain in feet when walking [] Pain in feet at rest [] Pain in feet when laying flat [] History of DVT [] Phlebitis [x] Swelling in legs [] Varicose veins [] Non-healing ulcers Pulmonary: [] Uses home oxygen [] Productive cough[] Hemoptysis [] Wheeze [] COPD [] Asthma Neurologic: [] Dizziness [] Blackouts [] Seizures [] History of stroke [] History of TIA[] Aphasia [] Temporary blindness[] Dysphagia [] Weaknessor numbness in arms [] Weakness or numbnessin legs Musculoskeletal: [] Arthritis [] Joint swelling [] Joint pain [] Low back pain Hematologic:[] Easy bruising[] Easy bleeding [] Hypercoagulable state [] Anemic  Gastrointestinal:[] Blood in stool[] Vomiting blood[] Gastroesophageal reflux/heartburn[] Abdominal pain Genitourinary: [] Chronic kidney disease [] Difficulturination [] Frequenturination [] Burning with urination[] Hematuria Skin: [] Rashes [x] Ulcers [x] Wounds Psychological: [] History of anxiety[] History of major depression.    Physical Examination  BP 111/68 (BP Location: Right Arm, Patient Position: Sitting)   Pulse 68   Resp 17   Ht 5' 5.5" (1.664 m)   Wt 115 lb (52.2 kg)   BMI 18.85 kg/m  Gen:  WD/WN, NAD.  Appears younger than stated age Head: [place]/AT, No temporalis wasting. Ear/Nose/Throat: Hearing grossly intact, nares w/o erythema or drainage Eyes: Conjunctiva clear. Sclera non-icteric Neck: Supple.  Trachea midline Pulmonary:   Good air movement, no use of accessory muscles.  Cardiac: RRR, no JVD Vascular:  Vessel Right Left  Radial Palpable Palpable                          PT  1+ palpable  2+ palpable  DP  trace palpable  1+ palpable    Musculoskeletal: M/S 5/5 throughout.  No deformity or atrophy.  Walks with a walker.  No significant edema. Neurologic: Sensation grossly intact in extremities.  Symmetrical.  Speech is fluent.  Psychiatric: Judgment intact, Mood & affect appropriate for pt's clinical situation. Dermatologic: No rashes or ulcers noted.  No cellulitis or open wounds.       Labs Recent Results (from the past 2160 hour(s))  CBC with Differential (Cancer Center Only)     Status: Abnormal   Collection Time: 08/25/18 10:14 AM  Result Value Ref Range   WBC Count 4.9 3.9 - 10.3 K/uL   RBC 2.94 (L) 3.70 - 5.45 MIL/uL   Hemoglobin 8.0 (L) 11.6 - 15.9 g/dL   HCT 24.6 (L) 34.8 - 46.6 %   MCV 83.8 79.5 - 101.0 fL   MCH 27.3 25.1 - 34.0 pg   MCHC 32.6 31.5 - 36.0 g/dL   RDW 19.5 (H) 11.2 - 14.5 %  Platelet Count 260 145 - 400 K/uL   Neutrophils Relative % 54 %   Neutro Abs 2.6 1.5 - 6.5 K/uL   Lymphocytes Relative 29 %   Lymphs Abs 1.4 0.9 - 3.3 K/uL   Monocytes Relative 14 %   Monocytes Absolute 0.7 0.1 - 0.9 K/uL   Eosinophils Relative 3 %   Eosinophils Absolute 0.2 0.0 - 0.5 K/uL   Basophils Relative 0 %   Basophils Absolute 0.0 0.0 - 0.1 K/uL    Comment: Performed at [facility], [place] [address] [place], [place] [address]  Basic metabolic panel     Status: Abnormal   Collection Time: 09/09/18  8:41 AM  Result Value Ref Range   Sodium 141 135 - 145 mmol/L   Potassium 4.1 3.5 - 5.1 mmol/L   Chloride 108 98 - 111 mmol/L   CO2 22 22 - 32 mmol/L   Glucose, Bld 117 (H) 70 - 99 mg/dL   BUN 23 8 - 23 mg/dL   Creatinine, Ser 2.00 (H) 0.44 - 1.00 mg/dL   Calcium 9.4 8.9 - 10.3 mg/dL   GFR calc non Af Amer 21 (L) >60 mL/min   GFR calc Af Amer 24 (L) >60  mL/min    Comment: (NOTE) The eGFR has been calculated using the CKD EPI equation. This calculation has not been validated in all clinical situations. eGFR's persistently <60 mL/min signify possible Chronic Kidney Disease.    Anion gap 11 5 - 15    Comment: Performed at [facility] [address] [place], [place] [address]  CBC     Status: Abnormal   Collection Time: 09/09/18  8:41 AM  Result Value Ref Range   WBC 6.1 4.0 - 10.5 K/uL   RBC 3.35 (L) 3.87 - 5.11 MIL/uL   Hemoglobin 8.8 (L) 12.0 - 15.0 g/dL   HCT 27.9 (L) 36.0 - 46.0 %   MCV 83.3 80.0 - 100.0 fL   MCH 26.3 26.0 - 34.0 pg   MCHC 31.5 30.0 - 36.0 g/dL   RDW 18.5 (H) 11.5 - 15.5 %   Platelets 329 150 - 400 K/uL   nRBC 0.0 0.0 - 0.2 %    Comment: Performed at [facility], [place] [address] [place], [place] [address]  D-dimer, quantitative (not at [facility])     Status: Abnormal   Collection Time: 09/09/18  8:46 AM  Result Value Ref Range   D-Dimer, Quant 1.16 (H) 0.00 - 0.50 ug/mL-FEU    Comment: (NOTE) At the manufacturer cut-off of 0.50 ug/mL FEU, this assay has been documented to exclude PE with a sensitivity and negative predictive value of 97 to 99%.  At this time, this assay has not been approved by the FDA to exclude DVT/VTE. Results should be correlated with clinical presentation. Performed at [facility], [place] [address], [place], [place] [address]   I-stat troponin, ED     Status: Abnormal   Collection Time: 09/09/18  8:58 AM  Result Value Ref Range   Troponin i, poc 0.11 (HH) 0.00 - 0.08 ng/mL   Comment NOTIFIED PHYSICIAN    Comment 3            Comment: Due to the release kinetics of cTnI, a negative result within the first hours of the onset of symptoms does not rule out myocardial infarction with certainty. If myocardial infarction is still suspected, repeat the test at appropriate intervals.   Type and screen  [place]     Status: None   Collection Time:  09/09/18  9:40 AM  Result Value Ref Range   ABO/RH(D) B POS    Antibody Screen NEG    Sample Expiration      09/12/2018 Performed at [facility], [place] [address] [place], [place] [address]   ABO/Rh     Status: None   Collection Time: 09/09/18  9:40 AM  Result Value Ref Range   ABO/RH(D)      B POS Performed at [facility] [address] [place], [place] [address]   I-Stat Troponin, ED (not at [facility])     Status: None   Collection Time: 09/09/18 12:06 PM  Result Value Ref Range   Troponin i, poc 0.08 0.00 - 0.08 ng/mL   Comment 3            Comment: Due to the release kinetics of cTnI, a negative result within the first hours of the onset of symptoms does not rule out myocardial infarction with certainty. If myocardial infarction is still suspected, repeat the test at appropriate intervals.   Hemoglobin A1c     Status: None   Collection Time: 09/09/18  2:19 PM  Result Value Ref Range   Hgb A1c MFr Bld 5.3 4.8 - 5.6 %    Comment: (NOTE) Pre diabetes:          5.7%-6.4% Diabetes:              >6.4% Glycemic control for   <7.0% adults with diabetes    Mean Plasma Glucose 105.41 mg/dL    Comment: Performed at [facility] [address], [place], [place] [address]  Brain natriuretic peptide     Status: Abnormal   Collection Time: 09/09/18  2:19 PM  Result Value Ref Range   B Natriuretic Peptide 676.4 (H) 0.0 - 100.0 pg/mL    Comment: Performed at [facility] [address] [place], [place] [address]  Troponin I     Status: Abnormal   Collection Time: 09/09/18  2:29 PM  Result Value Ref Range   Troponin I 0.06 (HH) <0.03 ng/mL    Comment: CRITICAL RESULT CALLED TO, READ BACK BY AND VERIFIED WITH: Y. IMAI RN [phone] [place] Performed at [facility], [place] [address] [place], [place] [address]   MRSA PCR Screening     Status: None   Collection Time: 09/09/18  2:35 PM  Result Value Ref Range   MRSA by PCR NEGATIVE NEGATIVE     Comment:        The GeneXpert MRSA Assay (FDA approved for NASAL specimens only), is one component of a comprehensive MRSA colonization surveillance program. It is not intended to diagnose MRSA infection nor to guide or monitor treatment for MRSA infections. Performed at [facility], [place] [address], [place], [place] [address]   ECHOCARDIOGRAM COMPLETE     Status: None   Collection Time: 09/09/18  3:31 PM  Result Value Ref Range   BP 141/80 mmHg  Glucose, capillary     Status: Abnormal   Collection Time: 09/09/18  5:07 PM  Result Value Ref Range   Glucose-Capillary 169 (H) 70 - 99 mg/dL  Troponin I     Status: Abnormal   Collection Time: 09/09/18  9:01 PM  Result Value Ref Range   Troponin I 0.10 (HH) <0.03 ng/mL    Comment: CRITICAL VALUE NOTED.  VALUE IS CONSISTENT WITH PREVIOUSLY REPORTED AND CALLED VALUE. Performed at [facility], [place] [address] [place], [place] [address]   Glucose, capillary     Status: Abnormal   Collection Time: 09/09/18  9:31 PM  Result Value Ref Range   Glucose-Capillary 66 (L) 70 - 99 mg/dL  Glucose, capillary     Status: Abnormal   Collection Time: 09/09/18 10:00 PM  Result Value Ref Range   Glucose-Capillary 114 (H) 70 - 99 mg/dL  Troponin I     Status: Abnormal   Collection Time: 09/10/18  2:57 AM  Result Value Ref Range   Troponin I 0.12 (HH) <0.03 ng/mL    Comment: CRITICAL VALUE NOTED.  VALUE IS CONSISTENT WITH PREVIOUSLY REPORTED AND CALLED VALUE. Performed at [facility], [place] [address] [place], [place] [address]   Basic metabolic panel     Status: Abnormal   Collection Time: 09/10/18  2:57 AM  Result Value Ref Range   Sodium 141 135 - 145 mmol/L   Potassium 3.6 3.5 - 5.1 mmol/L   Chloride 107 98 - 111 mmol/L   CO2 24 22 - 32 mmol/L   Glucose, Bld 110 (H) 70 - 99 mg/dL   BUN 23 8 - 23 mg/dL   Creatinine, Ser 1.99 (H) 0.44 - 1.00 mg/dL   Calcium 8.9 8.9 - 10.3 mg/dL   GFR calc non Af Amer 21 (L) >60 mL/min    GFR calc Af Amer 24 (L) >60 mL/min    Comment: (NOTE) The eGFR has been calculated using the CKD EPI equation. This calculation has not been validated in all clinical situations. eGFR's persistently <60 mL/min signify possible Chronic Kidney Disease.    Anion gap 10 5 - 15    Comment: Performed at [facility] [address] [place], [place] [address]  Glucose, capillary     Status: None   Collection Time: 09/10/18  7:40 AM  Result Value Ref Range   Glucose-Capillary 92 70 - 99 mg/dL  Glucose, capillary     Status: None   Collection Time: 09/10/18 11:27 AM  Result Value Ref Range   Glucose-Capillary 98 70 - 99 mg/dL  Glucose, capillary     Status: Abnormal   Collection Time: 09/10/18  4:26 PM  Result Value Ref Range   Glucose-Capillary 213 (H) 70 - 99 mg/dL  Glucose, capillary     Status: None   Collection Time: 09/10/18  8:53 PM  Result Value Ref Range   Glucose-Capillary 92 70 - 99 mg/dL  Basic metabolic panel     Status: Abnormal   Collection Time: 09/11/18  3:54 AM  Result Value Ref Range   Sodium 141 135 - 145 mmol/L   Potassium 3.5 3.5 - 5.1 mmol/L   Chloride 106 98 - 111 mmol/L   CO2 24 22 - 32 mmol/L   Glucose, Bld 97 70 - 99 mg/dL   BUN 26 (H) 8 - 23 mg/dL   Creatinine, Ser 2.50 (H) 0.44 - 1.00 mg/dL   Calcium 8.4 (L) 8.9 - 10.3 mg/dL   GFR calc non Af Amer 16 (L) >60 mL/min   GFR calc Af Amer 18 (L) >60 mL/min    Comment: (NOTE) The eGFR has been calculated using the CKD EPI equation. This calculation has not been validated in all clinical situations. eGFR's persistently <60 mL/min signify possible Chronic Kidney Disease.    Anion gap 11 5 - 15    Comment: Performed at  [facility], [place] [address] [place], [place] [address]  CBC     Status: Abnormal   Collection Time: 09/11/18  3:54 AM  Result Value Ref Range   WBC 5.0 4.0 - 10.5 K/uL   RBC 2.95 (L) 3.87 - 5.11 MIL/uL   Hemoglobin 7.7 (L) 12.0 - 15.0 g/dL   HCT 24.1 (L) 36.0 - 46.0 %   MCV  81.7 80.0 - 100.0 fL   MCH 26.1 26.0 - 34.0 pg   MCHC 32.0 30.0 - 36.0 g/dL   RDW 18.0 (H) 11.5 - 15.5 %   Platelets 275 150 - 400 K/uL   nRBC 0.0 0.0 - 0.2 %    Comment: Performed at [facility], [place] [address], [place], [place] [address]  Glucose, capillary     Status: None   Collection Time: 09/11/18  8:03 AM  Result Value Ref Range   Glucose-Capillary 84 70 - 99 mg/dL  Glucose, capillary     Status: Abnormal   Collection Time: 09/11/18 12:14 PM  Result Value Ref Range   Glucose-Capillary 171 (H) 70 - 99 mg/dL  Glucose, capillary     Status: Abnormal   Collection Time: 09/11/18  4:11 PM  Result Value Ref Range   Glucose-Capillary 113 (H) 70 - 99 mg/dL  Glucose, capillary     Status: Abnormal   Collection Time: 09/11/18  9:09 PM  Result Value Ref Range   Glucose-Capillary 180 (H) 70 - 99 mg/dL  CBC     Status: Abnormal   Collection Time: 09/12/18  4:09 AM  Result Value Ref Range   WBC 6.1 4.0 - 10.5 K/uL   RBC 3.16 (L) 3.87 - 5.11 MIL/uL   Hemoglobin 8.1 (L) 12.0 - 15.0 g/dL   HCT 25.6 (L) 36.0 - 46.0 %   MCV 81.0 80.0 - 100.0 fL   MCH 25.6 (L) 26.0 - 34.0 pg   MCHC 31.6 30.0 - 36.0 g/dL   RDW 17.5 (H) 11.5 - 15.5 %   Platelets 314 150 - 400 K/uL   nRBC 0.0 0.0 - 0.2 %    Comment: Performed at [facility], [place], [place], [place] [address]  Basic metabolic panel     Status: Abnormal   Collection Time: 09/12/18  4:09 AM  Result Value Ref Range   Sodium 141 135 - 145 mmol/L   Potassium 3.8 3.5 - 5.1 mmol/L   Chloride 106 98 - 111 mmol/L   CO2 26 22 - 32 mmol/L   Glucose, Bld 102 (H) 70 - 99 mg/dL   BUN 28 (H) 8 - 23 mg/dL   Creatinine, Ser 2.39 (H) 0.44 - 1.00 mg/dL   Calcium 8.5 (L) 8.9 - 10.3 mg/dL   GFR calc non Af Amer 17 (L) >60 mL/min   GFR calc Af Amer 19 (L) >60 mL/min    Comment: (NOTE) The eGFR has been calculated using the CKD EPI equation. This calculation has not been validated in all clinical situations. eGFR's persistently <60  mL/min signify possible Chronic Kidney Disease.    Anion gap 9 5 - 15    Comment: Performed at [facility] [address], [place], [place] [address]  Glucose, capillary     Status: None   Collection Time: 09/12/18  7:49 AM  Result Value Ref Range   Glucose-Capillary 98 70 - 99 mg/dL  Glucose, capillary     Status: Abnormal   Collection Time: 09/12/18 11:57 AM  Result Value Ref Range   Glucose-Capillary 161 (H) 70 - 99 mg/dL  Glucose, capillary     Status: None   Collection Time: 09/12/18  5:34 PM  Result Value Ref Range   Glucose-Capillary 90 70 - 99 mg/dL  Glucose, capillary     Status: Abnormal   Collection Time: 09/12/18  9:32 PM  Result Value Ref Range   Glucose-Capillary 239 (H) 70 - 99 mg/dL  Glucose, capillary     Status: None   Collection Time: 09/13/18  8:02 AM  Result Value Ref Range   Glucose-Capillary 92 70 - 99 mg/dL  Glucose, capillary     Status: Abnormal   Collection Time: 09/13/18 11:28 AM  Result Value Ref Range   Glucose-Capillary 185 (H) 70 - 99 mg/dL  Basic metabolic panel     Status: Abnormal   Collection Time: 09/13/18 11:30 AM  Result Value Ref Range   Sodium 139 135 - 145 mmol/L   Potassium 3.9 3.5 - 5.1 mmol/L   Chloride 105 98 - 111 mmol/L   CO2 24 22 - 32 mmol/L   Glucose, Bld 190 (H) 70 - 99 mg/dL   BUN 33 (H) 8 - 23 mg/dL   Creatinine, Ser 2.73 (H) 0.44 - 1.00 mg/dL   Calcium 8.5 (L) 8.9 - 10.3 mg/dL   GFR calc non Af Amer 14 (L) >60 mL/min   GFR calc Af Amer 17 (L) >60 mL/min    Comment: (NOTE) The eGFR has been calculated using the CKD EPI equation. This calculation has not been validated in all clinical situations. eGFR's persistently <60 mL/min signify possible Chronic Kidney Disease.    Anion gap 10 5 - 15    Comment: Performed at [facility] [address] [place], [place] [address]  Glucose, capillary     Status: Abnormal   Collection Time: 09/13/18  4:54 PM  Result Value Ref Range   Glucose-Capillary 108 (H)  70 - 99 mg/dL  Basic metabolic panel     Status: Abnormal   Collection Time: 09/15/18  7:38 AM  Result Value Ref Range   Sodium 138 135 - 145 mmol/L   Potassium 3.7 3.5 - 5.1 mmol/L   Chloride 101 98 - 111 mmol/L   CO2 24 22 - 32 mmol/L   Glucose, Bld 138 (H) 70 - 99 mg/dL   BUN 40 (H) 8 - 23 mg/dL   Creatinine, Ser 2.54 (H) 0.44 - 1.00 mg/dL   Calcium 8.5 (L) 8.9 - 10.3 mg/dL   GFR calc non Af Amer 16 (L) >60 mL/min   GFR calc Af Amer 18 (L) >60 mL/min    Comment: (NOTE) The eGFR has been calculated using the CKD EPI equation. This calculation has not been validated in all clinical situations. eGFR's persistently <60 mL/min signify possible Chronic Kidney Disease.    Anion gap 13 5 - 15    Comment: Performed at [facility], [address] [place], [place] [address]  CBC with Differential     Status: Abnormal   Collection Time: 09/15/18  7:38 AM  Result Value Ref Range   WBC 5.7 4.0 - 10.5 K/uL   RBC 3.16 (L) 3.87 - 5.11 MIL/uL   Hemoglobin 8.2 (L) 12.0 - 15.0 g/dL   HCT 25.8 (L) 36.0 - 46.0 %   MCV 81.6 80.0 - 100.0 fL   MCH 25.9 (L) 26.0 - 34.0 pg   MCHC 31.8 30.0 - 36.0 g/dL   RDW 17.6 (H)  11.5 - 15.5 %   Platelets 324 150 - 400 K/uL   nRBC 0.0 0.0 - 0.2 %   Neutrophils Relative % 63 %   Neutro Abs 3.6 1.7 - 7.7 K/uL   Lymphocytes Relative 25 %   Lymphs Abs 1.4 0.7 - 4.0 K/uL   Monocytes Relative 10 %   Monocytes Absolute 0.6 0.1 - 1.0 K/uL   Eosinophils Relative 2 %   Eosinophils Absolute 0.1 0.0 - 0.5 K/uL   Basophils Relative 0 %   Basophils Absolute 0.0 0.0 - 0.1 K/uL   Immature Granulocytes 0 %   Abs Immature Granulocytes 0.02 0.00 - 0.07 K/uL    Comment: Performed at [facility], [address] [place], [place] [address]  Troponin I     Status: Abnormal   Collection Time: 09/15/18  7:38 AM  Result Value Ref Range   Troponin I 0.04 (HH) <0.03 ng/mL    Comment: CRITICAL RESULT CALLED TO, READ BACK BY AND VERIFIED WITH [place] HAHLE  @0916  09/15/18 BY FMW Performed at [facility], [address] [place], [place] [address]   Brain natriuretic peptide     Status: Abnormal   Collection Time: 09/15/18  7:38 AM  Result Value Ref Range   B Natriuretic Peptide 398.0 (H) 0.0 - 100.0 pg/mL    Comment: Performed at [facility], [address] [place], [place] [address]  Troponin I     Status: Abnormal   Collection Time: 09/15/18 12:05 PM  Result Value Ref Range   Troponin I 0.06 (HH) <0.03 ng/mL    Comment: CRITICAL VALUE NOTED. VALUE IS CONSISTENT WITH PREVIOUSLY REPORTED/CALLED VALUE KLW Performed at [facility], [address] [place], [place] [address]   Troponin I     Status: Abnormal   Collection Time: 09/15/18  3:09 PM  Result Value Ref Range   Troponin I 0.07 (HH) <0.03 ng/mL    Comment: CRITICAL VALUE NOTED. VALUE IS CONSISTENT WITH PREVIOUSLY REPORTED/CALLED VALUE KLW Performed at [facility], [address] [place], [place] [address]   CBC     Status: Abnormal   Collection Time: 09/15/18  3:09 PM  Result Value Ref Range   WBC 5.0 4.0 - 10.5 K/uL   RBC 3.20 (L) 3.87 - 5.11 MIL/uL   Hemoglobin 8.4 (L) 12.0 - 15.0 g/dL   HCT 26.6 (L) 36.0 - 46.0 %   MCV 83.1 80.0 - 100.0 fL   MCH 26.3 26.0 - 34.0 pg   MCHC 31.6 30.0 - 36.0 g/dL   RDW 18.6 (H) 11.5 - 15.5 %   Platelets 308 150 - 400 K/uL   nRBC 0.0 0.0 - 0.2 %    Comment: Performed at [facility], [address] [place], [place] [address]  Creatinine, serum     Status: Abnormal   Collection Time: 09/15/18  3:09 PM  Result Value Ref Range   Creatinine, Ser 2.33 (H) 0.44 - 1.00 mg/dL   GFR calc non Af Amer 17 (L) >60 mL/min   GFR calc Af Amer 20 (L) >60 mL/min    Comment: (NOTE) The eGFR has been calculated using the CKD EPI equation. This calculation has not been validated in all clinical situations. eGFR's persistently <60 mL/min signify possible Chronic Kidney Disease. Performed at [facility], [address] [place],  [address]   Troponin I     Status: Abnormal   Collection Time: 09/15/18  6:38 PM  Result Value Ref  Range   Troponin I 0.08 (HH) <0.03 ng/mL    Comment: CRITICAL VALUE NOTED. VALUE IS CONSISTENT WITH PREVIOUSLY REPORTED/CALLED VALUE KLW Performed at [facility], [address] [place], [place] [address]   Basic metabolic panel     Status: Abnormal   Collection Time: 09/16/18  6:01 AM  Result Value Ref Range   Sodium 143 135 - 145 mmol/L   Potassium 3.5 3.5 - 5.1 mmol/L   Chloride 105 98 - 111 mmol/L   CO2 26 22 - 32 mmol/L   Glucose, Bld 111 (H) 70 - 99 mg/dL   BUN 36 (H) 8 - 23 mg/dL   Creatinine, Ser 2.32 (H) 0.44 - 1.00 mg/dL   Calcium 8.5 (L) 8.9 - 10.3 mg/dL   GFR calc non Af Amer 17 (L) >60 mL/min   GFR calc Af Amer 20 (L) >60 mL/min    Comment: (NOTE) The eGFR has been calculated using the CKD EPI equation. This calculation has not been validated in all clinical situations. eGFR's persistently <60 mL/min signify possible Chronic Kidney Disease.    Anion gap 12 5 - 15    Comment: Performed at [facility], [address] [place], [place] [address]  CBC     Status: Abnormal   Collection Time: 09/16/18  6:01 AM  Result Value Ref Range   WBC 4.7 4.0 - 10.5 K/uL   RBC 2.92 (L) 3.87 - 5.11 MIL/uL   Hemoglobin 7.6 (L) 12.0 - 15.0 g/dL   HCT 23.6 (L) 36.0 - 46.0 %   MCV 80.8 80.0 - 100.0 fL   MCH 26.0 26.0 - 34.0 pg   MCHC 32.2 30.0 - 36.0 g/dL   RDW 17.6 (H) 11.5 - 15.5 %   Platelets 283 150 - 400 K/uL   nRBC 0.0 0.0 - 0.2 %    Comment: Performed at [facility], [address] [place], [place] [address]  Glucose, capillary     Status: None   Collection Time: 09/16/18  7:45 AM  Result Value Ref Range   Glucose-Capillary 95 70 - 99 mg/dL  Glucose, capillary     Status: Abnormal   Collection Time: 09/16/18 11:37 AM  Result Value Ref Range   Glucose-Capillary 166 (H) 70 - 99 mg/dL    Glucose, capillary     Status: Abnormal   Collection Time: 09/16/18  4:47 PM  Result Value Ref Range   Glucose-Capillary 152 (H) 70 - 99 mg/dL  Glucose, capillary     Status: Abnormal   Collection Time: 09/16/18  9:11 PM  Result Value Ref Range   Glucose-Capillary 172 (H) 70 - 99 mg/dL  CBC     Status: Abnormal   Collection Time: 09/17/18  5:04 AM  Result Value Ref Range   WBC 4.7 4.0 - 10.5 K/uL   RBC 2.86 (L) 3.87 - 5.11 MIL/uL   Hemoglobin 7.4 (L) 12.0 - 15.0 g/dL   HCT 23.0 (L) 36.0 - 46.0 %   MCV 80.4 80.0 - 100.0 fL   MCH 25.9 (L) 26.0 - 34.0 pg   MCHC 32.2 30.0 - 36.0 g/dL   RDW 17.5 (H) 11.5 - 15.5 %   Platelets 279 150 - 400 K/uL   nRBC 0.0 0.0 - 0.2 %    Comment: Performed at [facility], [address], [place],  [address]  Basic metabolic panel     Status: Abnormal   Collection Time: 09/17/18  5:04 AM  Result Value Ref Range  Sodium 143 135 - 145 mmol/L   Potassium 3.7 3.5 - 5.1 mmol/L   Chloride 106 98 - 111 mmol/L   CO2 27 22 - 32 mmol/L   Glucose, Bld 71 70 - 99 mg/dL   BUN 40 (H) 8 - 23 mg/dL   Creatinine, Ser 2.61 (H) 0.44 - 1.00 mg/dL   Calcium 8.3 (L) 8.9 - 10.3 mg/dL   GFR calc non Af Amer 15 (L) >60 mL/min   GFR calc Af Amer 17 (L) >60 mL/min    Comment: (NOTE) The eGFR has been calculated using the CKD EPI equation. This calculation has not been validated in all clinical situations. eGFR's persistently <60 mL/min signify possible Chronic Kidney Disease.    Anion gap 10 5 - 15    Comment: Performed at [facility], [address] [address] [place], [place] [address]  Glucose, capillary     Status: None   Collection Time: 09/17/18  6:24 AM  Result Value Ref Range   Glucose-Capillary 80 70 - 99 mg/dL  Glucose, capillary     Status: None   Collection Time: 09/17/18  7:43 AM  Result Value Ref Range   Glucose-Capillary 73 70 - 99 mg/dL  Urinalysis, Routine w reflex microscopic     Status: Abnormal   Collection Time: 09/17/18  11:59 AM  Result Value Ref Range   Color, Urine YELLOW (A) YELLOW   APPearance CLEAR (A) CLEAR   Specific Gravity, Urine 1.016 1.005 - 1.030   pH 5.0 5.0 - 8.0   Glucose, UA NEGATIVE NEGATIVE mg/dL   Hgb urine dipstick NEGATIVE NEGATIVE   Bilirubin Urine NEGATIVE NEGATIVE   Ketones, ur NEGATIVE NEGATIVE mg/dL   Protein, ur NEGATIVE NEGATIVE mg/dL   Nitrite NEGATIVE NEGATIVE   Leukocytes, UA TRACE (A) NEGATIVE   RBC / HPF 0-5 0 - 5 RBC/hpf   WBC, UA 0-5 0 - 5 WBC/hpf   Bacteria, UA RARE (A) NONE SEEN   Squamous Epithelial / LPF 0-5 0 - 5   Mucus PRESENT    Hyaline Casts, UA PRESENT     Comment: Performed at [facility], [place] [place], [place] [address]  Glucose, capillary     Status: Abnormal   Collection Time: 09/17/18 12:16 PM  Result Value Ref Range   Glucose-Capillary 103 (H) 70 - 99 mg/dL  Protime-INR     Status: None   Collection Time: 09/20/18  6:48 AM  Result Value Ref Range   Prothrombin Time 14.4 11.4 - 15.2 seconds   INR 1.13     Comment: Performed at [name], [place] [address], [place] [address]  APTT     Status: None   Collection Time: 09/20/18  6:48 AM  Result Value Ref Range   aPTT 31 24 - 36 seconds    Comment: Performed at [place] [address] [place], [place] [address]  CBC     Status: Abnormal   Collection Time: 09/20/18  6:48 AM  Result Value Ref Range   WBC 4.6 4.0 - 10.5 K/uL   RBC 2.81 (L) 3.87 - 5.11 MIL/uL   Hemoglobin 7.0 (L) 12.0 - 15.0 g/dL   HCT 22.8 (L) 36.0 - 46.0 %   MCV 81.1 80.0 - 100.0 fL   MCH 24.9 (L) 26.0 - 34.0 pg   MCHC 30.7 30.0 - 36.0 g/dL   RDW 17.9 (H) 11.5 - 15.5 %   Platelets 279 150 - 400 K/uL  nRBC 0.4 (H) 0.0 - 0.2 %    Comment: Performed at [facility], [place] [address], [place], [place] [address]  Differential     Status: None   Collection Time: 09/20/18  6:48 AM  Result Value Ref Range   Neutrophils Relative % 58 %   Neutro Abs 2.7 1.7 - 7.7 K/uL    Lymphocytes Relative 28 %   Lymphs Abs 1.3 0.7 - 4.0 K/uL   Monocytes Relative 12 %   Monocytes Absolute 0.5 0.1 - 1.0 K/uL   Eosinophils Relative 2 %   Eosinophils Absolute 0.1 0.0 - 0.5 K/uL   Basophils Relative 0 %   Basophils Absolute 0.0 0.0 - 0.1 K/uL   Immature Granulocytes 0 %   Abs Immature Granulocytes 0.02 0.00 - 0.07 K/uL    Comment: Performed at [facility], [place] [address] [place], [place] [address]  Comprehensive metabolic panel     Status: Abnormal   Collection Time: 09/20/18  6:48 AM  Result Value Ref Range   Sodium 135 135 - 145 mmol/L   Potassium 3.9 3.5 - 5.1 mmol/L   Chloride 101 98 - 111 mmol/L   CO2 23 22 - 32 mmol/L   Glucose, Bld 142 (H) 70 - 99 mg/dL   BUN 38 (H) 8 - 23 mg/dL   Creatinine, Ser 2.69 (H) 0.44 - 1.00 mg/dL   Calcium 8.9 8.9 - 10.3 mg/dL   Total Protein 7.7 6.5 - 8.1 g/dL   Albumin 2.8 (L) 3.5 - 5.0 g/dL   AST 59 (H) 15 - 41 U/L   ALT 31 0 - 44 U/L   Alkaline Phosphatase 80 38 - 126 U/L   Total Bilirubin 0.4 0.3 - 1.2 mg/dL   GFR calc non Af Amer 15 (L) >60 mL/min   GFR calc Af Amer 17 (L) >60 mL/min    Comment: (NOTE) The eGFR has been calculated using the CKD EPI equation. This calculation has not been validated in all clinical situations. eGFR's persistently <60 mL/min signify possible Chronic Kidney Disease.    Anion gap 11 5 - 15    Comment: Performed at [place] [address] [place], [place] [address]  Ethanol     Status: None   Collection Time: 09/20/18  6:49 AM  Result Value Ref Range   Alcohol, Ethyl (B) <10 <10 mg/dL    Comment: (NOTE) Lowest detectable limit for serum alcohol is 10 mg/dL. For medical purposes only. Performed at [facility], [place] [address], [place], [place] [address]   I-stat troponin, ED     Status: None   Collection Time: 09/20/18  6:57 AM  Result Value Ref Range   Troponin i, poc 0.04 0.00 - 0.08 ng/mL   Comment 3            Comment: Due to the release kinetics of  cTnI, a negative result within the first hours of the onset of symptoms does not rule out myocardial infarction with certainty. If myocardial infarction is still suspected, repeat the test at appropriate intervals.   I-Stat Chem 8, ED     Status: Abnormal   Collection Time: 09/20/18  7:00 AM  Result Value Ref Range   Sodium 138 135 - 145 mmol/L   Potassium 4.0 3.5 - 5.1 mmol/L   Chloride 103 98 - 111 mmol/L   BUN 36 (H) 8 - 23 mg/dL   Creatinine, Ser 3.00 (H) 0.44 - 1.00 mg/dL  Glucose, Bld 141 (H) 70 - 99 mg/dL   Calcium, Ion 1.07 (L) 1.15 - 1.40 mmol/L   TCO2 26 22 - 32 mmol/L   Hemoglobin 7.5 (L) 12.0 - 15.0 g/dL   HCT 22.0 (L) 36.0 - 46.0 %  CBG monitoring, ED     Status: Abnormal   Collection Time: 09/20/18  9:12 AM  Result Value Ref Range   Glucose-Capillary 122 (H) 70 - 99 mg/dL  POC occult blood, ED RN will collect     Status: None   Collection Time: 09/20/18  9:17 AM  Result Value Ref Range   Fecal Occult Bld NEGATIVE NEGATIVE  Prepare RBC     Status: None   Collection Time: 09/20/18 11:09 AM  Result Value Ref Range   Order Confirmation      ORDER PROCESSED BY BLOOD BANK Performed at [facility], [place] [address], [place], [place] [address]   Type and screen [place]     Status: None   Collection Time: 09/20/18 11:10 AM  Result Value Ref Range   ABO/RH(D) B POS    Antibody Screen NEG    Sample Expiration 09/23/2018    Unit Number [id number]    Blood Component Type RED CELLS,LR    Unit division 00    Status of Unit ISSUED,FINAL    Transfusion Status OK TO TRANSFUSE    Crossmatch Result      Compatible Performed at [facility], [place] [name] [place], [place] [address]   BPAM RBC     Status: None   Collection Time: 09/20/18 11:10 AM  Result Value Ref Range   ISSUE DATE / TIME [id number]    Blood Product Unit Number [id number]    PRODUCT CODE [id number]    Unit Type and Rh 7300    Blood Product Expiration Date  [id number]   Troponin I (q 6hr x 3)     Status: Abnormal   Collection Time: 09/21/18  3:56 AM  Result Value Ref Range   Troponin I 0.04 (HH) <0.03 ng/mL    Comment: CRITICAL RESULT CALLED TO, READ BACK BY AND VERIFIED WITH: Jeannetta Ellis [id number] [place] Performed at [facility], [place] [address] [place], [place] [address]   Hemoglobin A1c     Status: Abnormal   Collection Time: 09/21/18  3:56 AM  Result Value Ref Range   Hgb A1c MFr Bld 5.8 (H) 4.8 - 5.6 %    Comment: (NOTE) Pre diabetes:          5.7%-6.4% Diabetes:              >6.4% Glycemic control for   <7.0% adults with diabetes    Mean Plasma Glucose 119.76 mg/dL    Comment: Performed at [facility] [address] [place],  [address]  Lipid panel     Status: None   Collection Time: 09/21/18  3:56 AM  Result Value Ref Range   Cholesterol 121 0 - 200 mg/dL   Triglycerides 91 <150 mg/dL   HDL 43 >40 mg/dL   Total CHOL/HDL Ratio 2.8 RATIO   VLDL 18 0 - 40 mg/dL   LDL Cholesterol 60 0 - 99 mg/dL    Comment:        Total Cholesterol/HDL:CHD Risk Coronary Heart Disease Risk Table                     Men   Women  1/2  Average Risk   3.4   3.3  Average Risk       5.0   4.4  2 X Average Risk   9.6   7.1  3 X Average Risk  23.4   11.0        Use the calculated Patient Ratio above and the CHD Risk Table to determine the patient's CHD Risk.        ATP III CLASSIFICATION (LDL):  <100     mg/dL   Optimal  100-129  mg/dL   Near or Above                    Optimal  130-159  mg/dL   Borderline  160-189  mg/dL   High  >190     mg/dL   Very High Performed at [facility] [address], [place], [place] [address]   CBC     Status: Abnormal   Collection Time: 09/21/18  3:56 AM  Result Value Ref Range   WBC 5.1 4.0 - 10.5 K/uL   RBC 3.57 (L) 3.87 - 5.11 MIL/uL   Hemoglobin 9.1 (L) 12.0 - 15.0 g/dL    Comment: REPEATED TO VERIFY POST TRANSFUSION SPECIMEN    HCT 27.7 (L) 36.0 - 46.0 %   MCV  77.6 (L) 80.0 - 100.0 fL   MCH 25.5 (L) 26.0 - 34.0 pg   MCHC 32.9 30.0 - 36.0 g/dL   RDW 17.1 (H) 11.5 - 15.5 %   Platelets 275 150 - 400 K/uL   nRBC 0.4 (H) 0.0 - 0.2 %    Comment: Performed at [facility], [place] [address] [place], [place] [address]  Comprehensive metabolic panel     Status: Abnormal   Collection Time: 09/21/18  3:56 AM  Result Value Ref Range   Sodium 140 135 - 145 mmol/L   Potassium 3.8 3.5 - 5.1 mmol/L   Chloride 106 98 - 111 mmol/L   CO2 23 22 - 32 mmol/L   Glucose, Bld 91 70 - 99 mg/dL   BUN 34 (H) 8 - 23 mg/dL   Creatinine, Ser 2.50 (H) 0.44 - 1.00 mg/dL   Calcium 8.9 8.9 - 10.3 mg/dL   Total Protein 7.9 6.5 - 8.1 g/dL   Albumin 2.8 (L) 3.5 - 5.0 g/dL   AST 47 (H) 15 - 41 U/L   ALT 30 0 - 44 U/L   Alkaline Phosphatase 75 38 - 126 U/L   Total Bilirubin 0.8 0.3 - 1.2 mg/dL   GFR calc non Af Amer 16 (L) >60 mL/min   GFR calc Af Amer 18 (L) >60 mL/min    Comment: (NOTE) The eGFR has been calculated using the CKD EPI equation. This calculation has not been validated in all clinical situations. eGFR's persistently <60 mL/min signify possible Chronic Kidney Disease.    Anion gap 11 5 - 15    Comment: Performed at [place] [address], [place], [place] [address]  H. pylori antibody, IgG     Status: Abnormal   Collection Time: 09/21/18  3:56 AM  Result Value Ref Range   H Pylori IgG 1.22 (H) 0.00 - 0.79 Index Value    Comment: (NOTE)                             Negative           <0.80  Equivocal    0.80 - 0.89                             Positive           >0.89 Performed At: [facility] [place], [place] [id number] Rush Farmer MD [id number]   Troponin I (q 6hr x 3)     Status: Abnormal   Collection Time: 09/21/18  9:06 AM  Result Value Ref Range   Troponin I 0.03 (HH) <0.03 ng/mL    Comment: CRITICAL VALUE NOTED.  VALUE IS CONSISTENT WITH PREVIOUSLY REPORTED AND CALLED  VALUE. Performed at [facility], [place] [address] [place], [place] [address]   Hemoglobin and hematocrit, blood     Status: Abnormal   Collection Time: 09/21/18  9:06 AM  Result Value Ref Range   Hemoglobin 8.9 (L) 12.0 - 15.0 g/dL   HCT 28.4 (L) 36.0 - 46.0 %    Comment: Performed at [facility], [place] [address], [place], [place] [address]  Glucose, capillary     Status: Abnormal   Collection Time: 09/21/18 11:04 AM  Result Value Ref Range   Glucose-Capillary 176 (H) 70 - 99 mg/dL  ECHOCARDIOGRAM LIMITED     Status: None   Collection Time: 09/21/18 11:46 AM  Result Value Ref Range   Weight 1,978.85 oz   BP 141/71 mmHg  Glucose, capillary     Status: Abnormal   Collection Time: 09/21/18  5:00 PM  Result Value Ref Range   Glucose-Capillary 108 (H) 70 - 99 mg/dL  Glucose, capillary     Status: Abnormal   Collection Time: 09/21/18  9:05 PM  Result Value Ref Range   Glucose-Capillary 133 (H) 70 - 99 mg/dL  CBC     Status: Abnormal   Collection Time: 09/22/18  4:12 AM  Result Value Ref Range   WBC 5.9 4.0 - 10.5 K/uL   RBC 3.56 (L) 3.87 - 5.11 MIL/uL   Hemoglobin 9.0 (L) 12.0 - 15.0 g/dL   HCT 28.0 (L) 36.0 - 46.0 %   MCV 78.7 (L) 80.0 - 100.0 fL   MCH 25.3 (L) 26.0 - 34.0 pg   MCHC 32.1 30.0 - 36.0 g/dL   RDW 17.6 (H) 11.5 - 15.5 %   Platelets 267 150 - 400 K/uL   nRBC 0.0 0.0 - 0.2 %    Comment: Performed at [facility], [place]. [address] [place], [place] [address]  Basic metabolic panel     Status: Abnormal   Collection Time: 09/22/18  4:12 AM  Result Value Ref Range   Sodium 140 135 - 145 mmol/L   Potassium 3.8 3.5 - 5.1 mmol/L   Chloride 107 98 - 111 mmol/L   CO2 24 22 - 32 mmol/L   Glucose, Bld 120 (H) 70 - 99 mg/dL   BUN 37 (H) 8 - 23 mg/dL   Creatinine, Ser 2.62 (H) 0.44 - 1.00 mg/dL   Calcium 9.0 8.9 - 10.3 mg/dL   GFR calc non Af Amer 15 (L) >60 mL/min   GFR calc Af Amer 17 (L) >60 mL/min    Comment: (NOTE) The eGFR has been calculated using  the CKD EPI equation. This calculation has not been validated in all clinical situations. eGFR's persistently <60 mL/min signify possible Chronic Kidney Disease.    Anion gap 9 5 - 15    Comment: Performed at [facility], [place] [address], [place], [place] [address]  Glucose, capillary     Status: Abnormal   Collection Time: 09/22/18  6:25 AM  Result Value Ref Range   Glucose-Capillary 112 (H) 70 - 99 mg/dL   Comment 1 Notify RN    Comment 2 Document in Chart   Glucose, capillary     Status: Abnormal   Collection Time: 09/22/18 11:32 AM  Result Value Ref Range   Glucose-Capillary 182 (H) 70 - 99 mg/dL  Urine rapid drug screen (hosp performed)     Status: None   Collection Time: 09/22/18  1:48 PM  Result Value Ref Range   Opiates NONE DETECTED NONE DETECTED   Cocaine NONE DETECTED NONE DETECTED   Benzodiazepines NONE DETECTED NONE DETECTED   Amphetamines NONE DETECTED NONE DETECTED   Tetrahydrocannabinol NONE DETECTED NONE DETECTED   Barbiturates NONE DETECTED NONE DETECTED    Comment: (NOTE) DRUG SCREEN FOR MEDICAL PURPOSES ONLY.  IF CONFIRMATION IS NEEDED FOR ANY PURPOSE, NOTIFY LAB WITHIN 5 DAYS. LOWEST DETECTABLE LIMITS FOR URINE DRUG SCREEN Drug Class                     Cutoff (ng/mL) Amphetamine and metabolites    1000 Barbiturate and metabolites    200 Benzodiazepine                 416 Tricyclics and metabolites     300 Opiates and metabolites        300 Cocaine and metabolites        300 THC                            50 Performed at [facility], [place] [address] [place], [place] [address]   Urinalysis, Routine w reflex microscopic     Status: Abnormal   Collection Time: 09/22/18  1:49 PM  Result Value Ref Range   Color, Urine YELLOW YELLOW   APPearance CLEAR CLEAR   Specific Gravity, Urine 1.014 1.005 - 1.030   pH 7.0 5.0 - 8.0   Glucose, UA NEGATIVE NEGATIVE mg/dL   Hgb urine dipstick NEGATIVE NEGATIVE   Bilirubin Urine NEGATIVE NEGATIVE    Ketones, ur NEGATIVE NEGATIVE mg/dL   Protein, ur 30 (A) NEGATIVE mg/dL   Nitrite NEGATIVE NEGATIVE   Leukocytes, UA NEGATIVE NEGATIVE   WBC, UA 0-5 0 - 5 WBC/hpf   Bacteria, UA NONE SEEN NONE SEEN   Squamous Epithelial / LPF 0-5 0 - 5   Mucus PRESENT     Comment: Performed at [facility], [place] [address] [place], [place] [address]  CBC with Differential ([place] Only)     Status: Abnormal   Collection Time: 10/12/18 11:30 AM  Result Value Ref Range   WBC Count 9.8 4.0 - 10.5 K/uL   RBC 4.00 3.87 - 5.11 MIL/uL   Hemoglobin 10.2 (L) 12.0 - 15.0 g/dL   HCT 32.6 (L) 36.0 - 46.0 %   MCV 81.5 80.0 - 100.0 fL   MCH 25.5 (L) 26.0 - 34.0 pg   MCHC 31.3 30.0 - 36.0 g/dL   RDW 18.4 (H) 11.5 - 15.5 %   Platelet Count 231 150 - 400 K/uL   nRBC 0.0 0.0 - 0.2 %   Neutrophils Relative % 75 %   Neutro Abs 7.3 1.7 - 7.7 K/uL   Lymphocytes Relative 13 %   Lymphs Abs 1.3 0.7 -  4.0 K/uL   Monocytes Relative 11 %   Monocytes Absolute 1.1 (H) 0.1 - 1.0 K/uL   Eosinophils Relative 1 %   Eosinophils Absolute 0.1 0.0 - 0.5 K/uL   Basophils Relative 0 %   Basophils Absolute 0.0 0.0 - 0.1 K/uL   Immature Granulocytes 0 %   Abs Immature Granulocytes 0.04 0.00 - 0.07 K/uL    Comment: Performed at [facility], [place] [address], [place], [place] [address]  CBC with Differential     Status: Abnormal   Collection Time: 10/15/18 10:00 AM  Result Value Ref Range   WBC 6.2 4.0 - 10.5 K/uL   RBC 4.09 3.87 - 5.11 MIL/uL   Hemoglobin 10.2 (L) 12.0 - 15.0 g/dL   HCT 33.0 (L) 36.0 - 46.0 %   MCV 80.7 80.0 - 100.0 fL   MCH 24.9 (L) 26.0 - 34.0 pg   MCHC 30.9 30.0 - 36.0 g/dL   RDW 18.0 (H) 11.5 - 15.5 %   Platelets 292 150 - 400 K/uL   nRBC 0.3 (H) 0.0 - 0.2 %   Neutrophils Relative % 61 %   Neutro Abs 3.8 1.7 - 7.7 K/uL   Lymphocytes Relative 26 %   Lymphs Abs 1.6 0.7 - 4.0 K/uL   Monocytes Relative 11 %   Monocytes Absolute 0.7 0.1 - 1.0 K/uL   Eosinophils Relative  2 %   Eosinophils Absolute 0.1 0.0 - 0.5 K/uL   Basophils Relative 0 %   Basophils Absolute 0.0 0.0 - 0.1 K/uL   Immature Granulocytes 0 %   Abs Immature Granulocytes 0.01 0.00 - 0.07 K/uL    Comment: Performed at [facility], [address] [place], [place] [address]  Comprehensive metabolic panel     Status: Abnormal   Collection Time: 10/15/18 10:00 AM  Result Value Ref Range   Sodium 142 135 - 145 mmol/L   Potassium 4.4 3.5 - 5.1 mmol/L   Chloride 109 98 - 111 mmol/L   CO2 23 22 - 32 mmol/L   Glucose, Bld 142 (H) 70 - 99 mg/dL   BUN 46 (H) 8 - 23 mg/dL   Creatinine, Ser 3.12 (H) 0.44 - 1.00 mg/dL   Calcium 8.9 8.9 - 10.3 mg/dL   Total Protein 8.5 (H) 6.5 - 8.1 g/dL   Albumin 2.9 (L) 3.5 - 5.0 g/dL   AST 29 15 - 41 U/L   ALT 18 0 - 44 U/L   Alkaline Phosphatase 65 38 - 126 U/L   Total Bilirubin 0.8 0.3 - 1.2 mg/dL   GFR calc non Af Amer 12 (L) >60 mL/min   GFR calc Af Amer 14 (L) >60 mL/min    Comment: (NOTE) The eGFR has been calculated using the CKD EPI equation. This calculation has not been validated in all clinical situations. eGFR's persistently <60 mL/min signify possible Chronic Kidney Disease.    Anion gap 10 5 - 15    Comment: Performed at [place] [address] [place], [place] [address]  Troponin I - ONCE - STAT     Status: Abnormal   Collection Time: 10/15/18 10:00 AM  Result Value Ref Range   Troponin I 0.03 (HH) <0.03 ng/mL    Comment: CRITICAL RESULT CALLED TO, READ BACK BY AND VERIFIED WITH: Delano Metz RN @ 1113 ON 10/15/18 BY TEMOCHE, H Performed at [facility], [place] [address] [place], [place] [address]   Brain natriuretic peptide  Status: Abnormal   Collection Time: 10/15/18 10:00 AM  Result Value Ref Range   B Natriuretic Peptide 2,046.5 (H) 0.0 - 100.0 pg/mL    Comment: Performed at [facility] [address] [place], [place] [address]  Troponin I - Now Then Q6H     Status: Abnormal   Collection Time:  10/15/18  1:45 PM  Result Value Ref Range   Troponin I 0.03 (HH) <0.03 ng/mL    Comment: CRITICAL VALUE NOTED.  VALUE IS CONSISTENT WITH PREVIOUSLY REPORTED AND CALLED VALUE. Performed at [facility], [place] [address], [place], [place] [address]   MRSA PCR Screening     Status: None   Collection Time: 10/15/18  4:46 PM  Result Value Ref Range   MRSA by PCR NEGATIVE NEGATIVE    Comment:        The GeneXpert MRSA Assay (FDA approved for NASAL specimens only), is one component of a comprehensive MRSA colonization surveillance program. It is not intended to diagnose MRSA infection nor to guide or monitor treatment for MRSA infections. Performed at [facility], [place] [address] [place], [place] [address]   Urinalysis, Routine w reflex microscopic     Status: Abnormal   Collection Time: 10/15/18  7:38 PM  Result Value Ref Range   Color, Urine STRAW (A) YELLOW   APPearance CLEAR CLEAR   Specific Gravity, Urine 1.010 1.005 - 1.030   pH 6.0 5.0 - 8.0   Glucose, UA NEGATIVE NEGATIVE mg/dL   Hgb urine dipstick NEGATIVE NEGATIVE   Bilirubin Urine NEGATIVE NEGATIVE   Ketones, ur NEGATIVE NEGATIVE mg/dL   Protein, ur NEGATIVE NEGATIVE mg/dL   Nitrite NEGATIVE NEGATIVE   Leukocytes, UA TRACE (A) NEGATIVE   RBC / HPF 0-5 0 - 5 RBC/hpf   WBC, UA 0-5 0 - 5 WBC/hpf   Bacteria, UA RARE (A) NONE SEEN   Squamous Epithelial / LPF 0-5 0 - 5   Mucus PRESENT    Hyaline Casts, UA PRESENT     Comment: Performed at [facility] [address] [place], [place] [address]  Troponin I - Now Then Q6H     Status: Abnormal   Collection Time: 10/15/18  8:15 PM  Result Value Ref Range   Troponin I 0.03 (HH) <0.03 ng/mL    Comment: CRITICAL RESULT CALLED TO, READ BACK BY AND VERIFIED WITH: FUTRELL,M RN 10/15/2018 2138 JORDANS Performed at [facility], [place] [address] [place], [place] [address]   Glucose, capillary     Status: Abnormal   Collection Time: 10/15/18  9:04 PM    Result Value Ref Range   Glucose-Capillary 133 (H) 70 - 99 mg/dL  Basic metabolic panel     Status: Abnormal   Collection Time: 10/16/18  5:58 AM  Result Value Ref Range   Sodium 144 135 - 145 mmol/L   Potassium 6.2 (H) 3.5 - 5.1 mmol/L    Comment: SLIGHT HEMOLYSIS   Chloride 108 98 - 111 mmol/L   CO2 21 (L) 22 - 32 mmol/L   Glucose, Bld 125 (H) 70 - 99 mg/dL   BUN 49 (H) 8 - 23 mg/dL   Creatinine, Ser 3.00 (H) 0.44 - 1.00 mg/dL   Calcium 9.0 8.9 - 10.3 mg/dL   GFR calc non Af Amer 13 (L) >60 mL/min   GFR calc Af Amer 15 (L) >60 mL/min    Comment: (NOTE) The eGFR has been calculated using the CKD EPI  equation. This calculation has not been validated in all clinical situations. eGFR's persistently <60 mL/min signify possible Chronic Kidney Disease.    Anion gap 15 5 - 15    Comment: Performed at [facility] [address] [place], [place] [address]  Glucose, capillary     Status: Abnormal   Collection Time: 10/16/18  7:20 AM  Result Value Ref Range   Glucose-Capillary 130 (H) 70 - 99 mg/dL   Comment 1 Notify RN   Basic metabolic panel     Status: Abnormal   Collection Time: 10/16/18  7:48 AM  Result Value Ref Range   Sodium 145 135 - 145 mmol/L   Potassium 5.0 3.5 - 5.1 mmol/L   Chloride 110 98 - 111 mmol/L   CO2 19 (L) 22 - 32 mmol/L   Glucose, Bld 132 (H) 70 - 99 mg/dL   BUN 49 (H) 8 - 23 mg/dL   Creatinine, Ser 2.82 (H) 0.44 - 1.00 mg/dL   Calcium 9.1 8.9 - 10.3 mg/dL   GFR calc non Af Amer 14 (L) >60 mL/min   GFR calc Af Amer 16 (L) >60 mL/min    Comment: (NOTE) The eGFR has been calculated using the CKD EPI equation. This calculation has not been validated in all clinical situations. eGFR's persistently <60 mL/min signify possible Chronic Kidney Disease.    Anion gap 16 (H) 5 - 15    Comment: Performed at [facility], [place] [address], [place], [place] [address]  Magnesium     Status: Abnormal   Collection Time: 10/16/18  7:48 AM  Result Value  Ref Range   Magnesium 2.6 (H) 1.7 - 2.4 mg/dL    Comment: Performed at [facility] [address], [place], [place] [address]  Glucose, capillary     Status: None   Collection Time: 10/16/18 12:03 PM  Result Value Ref Range   Glucose-Capillary 86 70 - 99 mg/dL   Comment 1 Notify RN   Glucose, capillary     Status: Abnormal   Collection Time: 10/16/18  5:06 PM  Result Value Ref Range   Glucose-Capillary 125 (H) 70 - 99 mg/dL   Comment 1 Notify RN   Renal function panel     Status: Abnormal   Collection Time: 10/17/18  5:21 AM  Result Value Ref Range   Sodium 143 135 - 145 mmol/L   Potassium 3.9 3.5 - 5.1 mmol/L    Comment: DELTA CHECK NOTED   Chloride 108 98 - 111 mmol/L   CO2 29 22 - 32 mmol/L   Glucose, Bld 117 (H) 70 - 99 mg/dL   BUN 46 (H) 8 - 23 mg/dL   Creatinine, Ser 3.02 (H) 0.44 - 1.00 mg/dL   Calcium 8.5 (L) 8.9 - 10.3 mg/dL   Phosphorus 4.7 (H) 2.5 - 4.6 mg/dL   Albumin 2.5 (L) 3.5 - 5.0 g/dL   GFR calc non Af Amer 13 (L) >60 mL/min   GFR calc Af Amer 15 (L) >60 mL/min    Comment: (NOTE) The eGFR has been calculated using the CKD EPI equation. This calculation has not been validated in all clinical situations. eGFR's persistently <60 mL/min signify possible Chronic Kidney Disease.    Anion gap 6 5 - 15    Comment: Performed at [facility] [address] [place], [place] [address]  Magnesium     Status: None   Collection Time: 10/17/18  5:21 AM  Result Value Ref  Range   Magnesium 2.4 1.7 - 2.4 mg/dL    Comment: Performed at [facility] [address], [place], [place] [address]  CBC with Differential/Platelet     Status: Abnormal   Collection Time: 10/17/18  5:21 AM  Result Value Ref Range   WBC 6.4 4.0 - 10.5 K/uL   RBC 3.91 3.87 - 5.11 MIL/uL   Hemoglobin 9.5 (L) 12.0 - 15.0 g/dL   HCT 31.1 (L) 36.0 - 46.0 %   MCV 79.5 (L) 80.0 - 100.0 fL   MCH 24.3 (L) 26.0 - 34.0 pg   MCHC 30.5 30.0 - 36.0 g/dL   RDW 17.8 (H) 11.5 - 15.5 %    Platelets 297 150 - 400 K/uL   nRBC 0.0 0.0 - 0.2 %   Neutrophils Relative % 48 %   Neutro Abs 3.1 1.7 - 7.7 K/uL   Lymphocytes Relative 35 %   Lymphs Abs 2.2 0.7 - 4.0 K/uL   Monocytes Relative 12 %   Monocytes Absolute 0.7 0.1 - 1.0 K/uL   Eosinophils Relative 4 %   Eosinophils Absolute 0.2 0.0 - 0.5 K/uL   Basophils Relative 0 %   Basophils Absolute 0.0 0.0 - 0.1 K/uL   Immature Granulocytes 1 %   Abs Immature Granulocytes 0.03 0.00 - 0.07 K/uL    Comment: Performed at [facility], [place] [address], [place], [place] [address]  Glucose, capillary     Status: Abnormal   Collection Time: 10/17/18  7:32 AM  Result Value Ref Range   Glucose-Capillary 149 (H) 70 - 99 mg/dL  Glucose, capillary     Status: Abnormal   Collection Time: 10/17/18 11:39 AM  Result Value Ref Range   Glucose-Capillary 124 (H) 70 - 99 mg/dL  Glucose, capillary     Status: Abnormal   Collection Time: 10/17/18  4:42 PM  Result Value Ref Range   Glucose-Capillary 115 (H) 70 - 99 mg/dL  Glucose, capillary     Status: Abnormal   Collection Time: 10/17/18  9:19 PM  Result Value Ref Range   Glucose-Capillary 139 (H) 70 - 99 mg/dL   Comment 1 Notify RN    Comment 2 Document in Chart   Basic metabolic panel     Status: Abnormal   Collection Time: 10/18/18  4:27 AM  Result Value Ref Range   Sodium 143 135 - 145 mmol/L   Potassium 3.4 (L) 3.5 - 5.1 mmol/L   Chloride 108 98 - 111 mmol/L   CO2 25 22 - 32 mmol/L   Glucose, Bld 113 (H) 70 - 99 mg/dL   BUN 44 (H) 8 - 23 mg/dL   Creatinine, Ser 3.03 (H) 0.44 - 1.00 mg/dL   Calcium 8.5 (L) 8.9 - 10.3 mg/dL   GFR calc non Af Amer 13 (L) >60 mL/min   GFR calc Af Amer 15 (L) >60 mL/min    Comment: (NOTE) The eGFR has been calculated using the CKD EPI equation. This calculation has not been validated in all clinical situations. eGFR's persistently <60 mL/min signify possible Chronic Kidney Disease.    Anion gap 10 5 - 15    Comment: Performed at [facility] [address], [place], [place] [address]  Glucose, capillary     Status: Abnormal   Collection Time: 10/18/18  7:47 AM  Result Value Ref Range   Glucose-Capillary 109 (H) 70 - 99 mg/dL   Comment 1 Notify RN   Glucose, capillary  Status: Abnormal   Collection Time: 10/18/18 11:24 AM  Result Value Ref Range   Glucose-Capillary 132 (H) 70 - 99 mg/dL   Comment 1 Notify RN     Radiology Dg Chest 2 View  Result Date: 10/17/2018 CLINICAL DATA:  History of CHF, hypertension, and diabetes. Now with shortness of breath. EXAM: CHEST - 2 VIEW COMPARISON:  Portable chest x-ray of October 15, 2018 FINDINGS: There is increasing density at the right lung base. There small bilateral pleural effusions. That on the right is new. There is patchy increased density at the left lung base which is fairly stable. The heart is mildly enlarged. The pulmonary vascularity is slightly less engorged than on the previous study. There is dense calcification in the wall of the thoracic aorta. There is multilevel degenerative disc disease of the thoracic spine. IMPRESSION: Increased atelectasis or pneumonia at the right lung base with small right pleural effusion. Fairly stable left basilar atelectasis or pneumonia with small effusion. Decreased pulmonary vascular congestion. Thoracic aortic atherosclerosis. Electronically Signed   By: David  Martinique M.D.   On: 10/17/2018 09:01   Dg Chest 2 View  Result Date: 10/15/2018 CLINICAL DATA:  Weakness and fatigue for 1 week. Constant mild shortness of breath on home oxygen. History of diabetes and hypertension. EXAM: CHEST - 2 VIEW COMPARISON:  09/22/2018 and 09/20/2018. FINDINGS: Stable mild cardiomegaly and diffuse aortic atherosclerosis. There are left-greater-than-right pleural effusions with associated bibasilar pulmonary opacities and mild edema. The left pleural effusion has enlarged compared with the previous study. The aeration of the right lung base has  improved. There is no pneumothorax or acute osseous abnormality. Telemetry leads overlie the chest. IMPRESSION: Fluctuating pulmonary edema, pleural effusions and bibasilar pulmonary opacities, likely congestive heart failure and associated basilar atelectasis. The edema has mildly improved from previous study 3 weeks ago. Electronically Signed   By: Richardean Sale M.D.   On: 10/15/2018 10:56   US Renal  Result Date: 10/17/2018 CLINICAL DATA:  Acute on chronic renal failure. History of hypertension and diabetes. EXAM: RENAL / URINARY TRACT ULTRASOUND COMPLETE COMPARISON:  Limited splenic ultrasound 08/26/2018. Abdominal CTA 10/19/2017. Chest radiographs 10/17/2018 FINDINGS: Right Kidney: Renal measurements: 9.6 x 3.4 x 5.7 cm = volume: 98.8 mL. There is renal cortical thinning and increased echogenicity. No hydronephrosis or focal cortical lesion. Left Kidney: Renal measurements: 8.8 x 4.9 x 4.5 cm = volume: 100.7 mL. There is renal cortical thinning and increased echogenicity. No hydronephrosis or focal cortical lesion. Bladder: Appears normal for the degree of bladder distention. At least moderate size bilateral pleural effusions are noted. IMPRESSION: 1. Both kidneys are small with cortical thinning and increased echogenicity consistent with chronic medical renal disease. No hydronephrosis. 2. At least moderate bilateral pleural effusions. Electronically Signed   By: Richardean Sale M.D.   On: 10/17/2018 16:25   Dg Chest Port 1 View  Result Date: 09/22/2018 CLINICAL DATA:  Shortness of breath. EXAM: PORTABLE CHEST 1 VIEW COMPARISON:  Radiographs of September 20, 2018. FINDINGS: Stable cardiomediastinal silhouette. Atherosclerosis of thoracic aorta is noted. No pneumothorax is noted. Stable bilateral perihilar and basilar opacities are noted concerning for edema with associated pleural effusions. Bony thorax is unremarkable. IMPRESSION: Stable bilateral lung opacities are noted concerning for edema with  associated pleural effusions. Electronically Signed   By: Marijo Conception, M.D.   On: 09/22/2018 09:53   Vas [place] Burnard Bunting With/wo Tbi  Result Date: 10/21/2018 LOWER EXTREMITY DOPPLER STUDY Indications: Ulceration.  Vascular Interventions: Amputation of left  great toe. Comparison Study: 03/22/2018, ABI's increased today. Performing Technologist: Almira Coaster RVS  Examination Guidelines: A complete evaluation includes at minimum, Doppler waveform signals and systolic blood pressure reading at the level of bilateral brachial, anterior tibial, and posterior tibial arteries, when vessel segments are accessible. Bilateral testing is considered an integral part of a complete examination. Photoelectric Plethysmograph (PPG) waveforms and toe systolic pressure readings are included as required and additional duplex testing as needed. Limited examinations for reoccurring indications may be performed as noted.  ABI Findings: +---------+------------------+-----+--------+--------+ Right    Rt Pressure (mmHg)IndexWaveformComment  +---------+------------------+-----+--------+--------+ Brachial 136                                     +---------+------------------+-----+--------+--------+ ATA      96                0.71                  +---------+------------------+-----+--------+--------+ PTA      101               0.74                  +---------+------------------+-----+--------+--------+ Great Toe48                0.35                  +---------+------------------+-----+--------+--------+ +---------+------------------+-----+--------+-------+ Left     Lt Pressure (mmHg)IndexWaveformComment +---------+------------------+-----+--------+-------+ Brachial 136                                    +---------+------------------+-----+--------+-------+ ATA      140               1.03                 +---------+------------------+-----+--------+-------+ PTA      131               0.96                  +---------+------------------+-----+--------+-------+ Great Toe100               0.74         2nd toe +---------+------------------+-----+--------+-------+ +-------+-----------+-----------+------------+------------+ ABI/TBIToday's ABIToday's TBIPrevious ABIPrevious TBI +-------+-----------+-----------+------------+------------+ Right  .74        .35        .68                      +-------+-----------+-----------+------------+------------+ Left   1.03       .74        .88                      +-------+-----------+-----------+------------+------------+ Bilateral ABIs appear increased compared to prior study on 03/22/2018.  Summary: Right: Resting right ankle-brachial index indicates moderate right lower extremity arterial disease. The right toe-brachial index is abnormal. Left: Resting left ankle-brachial index is within normal range. No evidence of significant left lower extremity arterial disease. The left toe-brachial index is normal.  *See table(s) above for measurements and observations.  Electronically signed by Leotis Pain MD on 10/21/2018 at 2:02:22 PM.    Final     Assessment/Plan Diabetes mellitus without complication blood glucose control important in reducing the progression of atherosclerotic disease. Also, involved  in wound healing. On appropriate medications.   Hypertension blood pressure control important in reducing the progression of atherosclerotic disease. On appropriate oral medications.  CKD (chronic kidney disease) stage 4, GFR 15-29 ml/min (HCC) Would avoid the use of contrast for any evaluation or treatment unless she has a limb threatening situation.  PAD (peripheral artery disease) (HCC) ABIs today are stable to slightly improved at 0.74 on the right and 1.03 on the left.  Her waveforms are pretty good on the left and fair on the right.  Overall doing well.  Continue aspirin and statin agent.  Recheck in 1 year    Leotis Pain,  MD  10/21/2018 2:40 PM    This note was created with Dragon medical transcription system.  Any errors from dictation are purely unintentional

## 2018-10-21 NOTE — Patient Instructions (Signed)

## 2018-10-21 NOTE — Assessment & Plan Note (Signed)
Would avoid the use of contrast for any evaluation or treatment unless she has a limb threatening situation.

## 2018-10-22 DIAGNOSIS — D500 Iron deficiency anemia secondary to blood loss (chronic): Secondary | ICD-10-CM | POA: Diagnosis not present

## 2018-10-22 DIAGNOSIS — I2510 Atherosclerotic heart disease of native coronary artery without angina pectoris: Secondary | ICD-10-CM | POA: Diagnosis not present

## 2018-10-22 DIAGNOSIS — I429 Cardiomyopathy, unspecified: Secondary | ICD-10-CM | POA: Diagnosis not present

## 2018-10-22 DIAGNOSIS — E1151 Type 2 diabetes mellitus with diabetic peripheral angiopathy without gangrene: Secondary | ICD-10-CM | POA: Diagnosis not present

## 2018-10-22 DIAGNOSIS — Z7982 Long term (current) use of aspirin: Secondary | ICD-10-CM | POA: Diagnosis not present

## 2018-10-22 DIAGNOSIS — Z7984 Long term (current) use of oral hypoglycemic drugs: Secondary | ICD-10-CM | POA: Diagnosis not present

## 2018-10-22 DIAGNOSIS — Z7902 Long term (current) use of antithrombotics/antiplatelets: Secondary | ICD-10-CM | POA: Diagnosis not present

## 2018-10-22 DIAGNOSIS — E785 Hyperlipidemia, unspecified: Secondary | ICD-10-CM | POA: Diagnosis not present

## 2018-10-22 DIAGNOSIS — Z89412 Acquired absence of left great toe: Secondary | ICD-10-CM | POA: Diagnosis not present

## 2018-10-22 DIAGNOSIS — E1122 Type 2 diabetes mellitus with diabetic chronic kidney disease: Secondary | ICD-10-CM | POA: Diagnosis not present

## 2018-10-22 DIAGNOSIS — N184 Chronic kidney disease, stage 4 (severe): Secondary | ICD-10-CM | POA: Diagnosis not present

## 2018-10-22 DIAGNOSIS — Z8673 Personal history of transient ischemic attack (TIA), and cerebral infarction without residual deficits: Secondary | ICD-10-CM | POA: Diagnosis not present

## 2018-10-22 DIAGNOSIS — D631 Anemia in chronic kidney disease: Secondary | ICD-10-CM | POA: Diagnosis not present

## 2018-10-22 DIAGNOSIS — M1990 Unspecified osteoarthritis, unspecified site: Secondary | ICD-10-CM | POA: Diagnosis not present

## 2018-10-22 DIAGNOSIS — I083 Combined rheumatic disorders of mitral, aortic and tricuspid valves: Secondary | ICD-10-CM | POA: Diagnosis not present

## 2018-10-22 DIAGNOSIS — Z95820 Peripheral vascular angioplasty status with implants and grafts: Secondary | ICD-10-CM | POA: Diagnosis not present

## 2018-10-22 DIAGNOSIS — I700 Atherosclerosis of aorta: Secondary | ICD-10-CM | POA: Diagnosis not present

## 2018-10-22 DIAGNOSIS — I130 Hypertensive heart and chronic kidney disease with heart failure and stage 1 through stage 4 chronic kidney disease, or unspecified chronic kidney disease: Secondary | ICD-10-CM | POA: Diagnosis not present

## 2018-10-22 DIAGNOSIS — K219 Gastro-esophageal reflux disease without esophagitis: Secondary | ICD-10-CM | POA: Diagnosis not present

## 2018-10-22 DIAGNOSIS — I5042 Chronic combined systolic (congestive) and diastolic (congestive) heart failure: Secondary | ICD-10-CM | POA: Diagnosis not present

## 2018-10-24 ENCOUNTER — Other Ambulatory Visit: Payer: Self-pay | Admitting: *Deleted

## 2018-10-24 NOTE — Patient Outreach (Signed)
[facility]) Care Management  10/24/2018  [name] 17-Nov-1926 [id number]   2nd attempt made to contact member's granddaughter, Lishel.  Member's identity verified.  This care manager introduced self and purpose of call.  [facility] care management services explained.  She report member lives with a son that has had a stroke, but there are other family members that are constantly in the home with them.  She do report one concern is having a CNA available for respite care.  Family provide all meals, granddaughters assist with medications and transportation to MD appointments.  State next appointment with primary MD is within the next week.    Granddaughter reports member may need more education on heart failure management.  This care manager attempted to schedule home visit for next week, she state she will call to confirm date/time.  Will await call for confirmation, if no call back will follow up within the next week.  Will complete assessment and develop individualized plan of care at the time of visit.  Valente David, [place], MSN [place] [phone]

## 2018-10-25 DIAGNOSIS — I429 Cardiomyopathy, unspecified: Secondary | ICD-10-CM | POA: Diagnosis not present

## 2018-10-25 DIAGNOSIS — D500 Iron deficiency anemia secondary to blood loss (chronic): Secondary | ICD-10-CM | POA: Diagnosis not present

## 2018-10-25 DIAGNOSIS — M1990 Unspecified osteoarthritis, unspecified site: Secondary | ICD-10-CM | POA: Diagnosis not present

## 2018-10-25 DIAGNOSIS — D631 Anemia in chronic kidney disease: Secondary | ICD-10-CM | POA: Diagnosis not present

## 2018-10-25 DIAGNOSIS — Z8673 Personal history of transient ischemic attack (TIA), and cerebral infarction without residual deficits: Secondary | ICD-10-CM | POA: Diagnosis not present

## 2018-10-25 DIAGNOSIS — Z7982 Long term (current) use of aspirin: Secondary | ICD-10-CM | POA: Diagnosis not present

## 2018-10-25 DIAGNOSIS — Z7984 Long term (current) use of oral hypoglycemic drugs: Secondary | ICD-10-CM | POA: Diagnosis not present

## 2018-10-25 DIAGNOSIS — N184 Chronic kidney disease, stage 4 (severe): Secondary | ICD-10-CM | POA: Diagnosis not present

## 2018-10-25 DIAGNOSIS — I2510 Atherosclerotic heart disease of native coronary artery without angina pectoris: Secondary | ICD-10-CM | POA: Diagnosis not present

## 2018-10-25 DIAGNOSIS — I5042 Chronic combined systolic (congestive) and diastolic (congestive) heart failure: Secondary | ICD-10-CM | POA: Diagnosis not present

## 2018-10-25 DIAGNOSIS — I083 Combined rheumatic disorders of mitral, aortic and tricuspid valves: Secondary | ICD-10-CM | POA: Diagnosis not present

## 2018-10-25 DIAGNOSIS — E1151 Type 2 diabetes mellitus with diabetic peripheral angiopathy without gangrene: Secondary | ICD-10-CM | POA: Diagnosis not present

## 2018-10-25 DIAGNOSIS — I130 Hypertensive heart and chronic kidney disease with heart failure and stage 1 through stage 4 chronic kidney disease, or unspecified chronic kidney disease: Secondary | ICD-10-CM | POA: Diagnosis not present

## 2018-10-25 DIAGNOSIS — I700 Atherosclerosis of aorta: Secondary | ICD-10-CM | POA: Diagnosis not present

## 2018-10-25 DIAGNOSIS — K219 Gastro-esophageal reflux disease without esophagitis: Secondary | ICD-10-CM | POA: Diagnosis not present

## 2018-10-25 DIAGNOSIS — Z89412 Acquired absence of left great toe: Secondary | ICD-10-CM | POA: Diagnosis not present

## 2018-10-25 DIAGNOSIS — Z95820 Peripheral vascular angioplasty status with implants and grafts: Secondary | ICD-10-CM | POA: Diagnosis not present

## 2018-10-25 DIAGNOSIS — E1122 Type 2 diabetes mellitus with diabetic chronic kidney disease: Secondary | ICD-10-CM | POA: Diagnosis not present

## 2018-10-25 DIAGNOSIS — Z7902 Long term (current) use of antithrombotics/antiplatelets: Secondary | ICD-10-CM | POA: Diagnosis not present

## 2018-10-25 DIAGNOSIS — E785 Hyperlipidemia, unspecified: Secondary | ICD-10-CM | POA: Diagnosis not present

## 2018-10-26 ENCOUNTER — Telehealth: Payer: Self-pay | Admitting: Family

## 2018-10-26 ENCOUNTER — Other Ambulatory Visit: Payer: Self-pay | Admitting: *Deleted

## 2018-10-26 ENCOUNTER — Ambulatory Visit: Payer: Medicare Other | Admitting: Family

## 2018-10-26 NOTE — Telephone Encounter (Signed)
Patient did not show for her Heart Failure Clinic appointment on 10/26/18. Will attempt to reschedule.

## 2018-10-26 NOTE — Patient Outreach (Signed)
[facility]) Care Management  10/26/2018  [name] 1926-04-27 [id number]   Notified that member triggered red on EMMI heart failure dashboard yesterday for not weighing.  Call placed to [place], granddaughter, to follow up on weights as well as to confirm home visit for next week (discussed Monday, 12/2 during last conversation).  No answer, HIPAA complaint voice message left.  Will follow up within the next 4 business days.  Valente David, [place], MSN [place] [phone]

## 2018-10-26 NOTE — Progress Notes (Deleted)
Patient ID: [name], female    DOB: 1926/08/10, 84 y.o.   MRN: [id number]  HPI  [name] is a 84 y/o female with a history of DM, HTN, dyslipidemia, GERD, anemia and chronic heart failure.   Echo report from 09/21/18 reviewed and showed an EF of 25-30% along with moderate-severe MR and mildly increased PA pressure of 35 mm Hg.  Admitted 10/15/18 due to acute on chronic HF. Initially needed IV lasix and then transitioned to oral diuretics. Discussion of possible palliative care. Discharged after 3 days. Admitted 09/20/18 due to TIA symptoms. Brain MRI was negative. Initially given some IV lasix for HF symptoms. GI consult obtained and she was discharged after 2 days. Admitted 09/15/18 due to acute on chronic HF. Cardiology and palliative care consults were obtained. Initially given IV lasix. Discharged after 2 days. Admitted 09/09/18 due to acute HF symptoms. Initially given IV lasix. Cardiology and palliative care consults obtained. Medications adjusted and she was discharged after 4 days.   She presents today for a follow-up visit with a chief complaint of   Past Medical History:  Diagnosis Date  . ACS (acute coronary syndrome) ([place]) 08/2018  . Anemia    family unsure of this history  . Arthritis   . CHF (congestive heart failure) ([place])   . Chronic ulcer of calf (HCC)    Left  . Constipation   . Diabetes mellitus without complication ([place])   . Dyslipidemia   . Dyspnea   . GERD (gastroesophageal reflux disease)   . Hyperlipidemia   . Hypertension   . Peripheral vascular disease [facility])    Past Surgical History:  Procedure Laterality Date  . ABDOMINAL AORTOGRAM W/LOWER EXTREMITY Left 12/27/2017   Procedure: ABDOMINAL AORTOGRAM W/LOWER EXTREMITY;  Surgeon: Algernon Huxley, MD;  Location: [facility];  Service: Cardiovascular;  Laterality: Left;  . ABDOMINAL HYSTERECTOMY    . AMPUTATION TOE Left 12/23/2017   Procedure: AMPUTATION TOE LEFT GREAT TOE;  Surgeon: Sharlotte Alamo, DPM;  Location: [facility] ORS;  Service: Podiatry;  Laterality: Left;  . ENDARTERECTOMY FEMORAL Left 09/08/2017   Procedure: ENDARTERECTOMY FEMORAL WITH CORMATRIX PATCH;  Surgeon: Algernon Huxley, MD;  Location: [facility] ORS;  Service: Vascular;  Laterality: Left;  . EYE SURGERY Bilateral    Cataract Extraction with IOL  . LOWER EXTREMITY ANGIOGRAPHY Left 07/22/2017   Procedure: Lower Extremity Angiography;  Surgeon: Algernon Huxley, MD;  Location: [facility];  Service: Cardiovascular;  Laterality: Left;  . LOWER EXTREMITY ANGIOGRAPHY Left 08/16/2017   Procedure: Lower Extremity Angiography;  Surgeon: Algernon Huxley, MD;  Location: [facility];  Service: Cardiovascular;  Laterality: Left;  Marland Kitchen MULTIPLE TOOTH EXTRACTIONS     Family History  Problem Relation Age of Onset  . Hypertension Father    Social History   Tobacco Use  . Smoking status: Never Smoker  . Smokeless tobacco: Never Used  Substance Use Topics  . Alcohol use: No   No Known Allergies   Review of Systems  Constitutional: Positive for fatigue (a little bit). Negative for appetite change.  HENT: Negative for congestion, postnasal drip and sore throat.   Eyes: Negative.   Respiratory: Positive for shortness of breath (minimal ).   Cardiovascular: Negative for chest pain, palpitations and leg swelling.  Gastrointestinal: Negative for abdominal distention and abdominal pain.  Endocrine: Negative.   Genitourinary: Negative.   Musculoskeletal: Negative for back pain and neck pain.  Skin: Negative.   Allergic/Immunologic:  Negative.   Neurological: Negative for dizziness and light-headedness.  Hematological: Negative for adenopathy. Does not bruise/bleed easily.  Psychiatric/Behavioral: Negative for sleep disturbance (sleeping on 2 pillows).      Physical Exam  Constitutional: She is oriented to person, place, and time. She appears well-developed and well-nourished.  HENT:  Head: Normocephalic and atraumatic.   Neck: Normal range of motion. Neck supple. No JVD present.  Cardiovascular: Normal rate and regular rhythm.  Pulmonary/Chest: Breath sounds normal. She has no rhonchi. She has no rales.  Abdominal: Soft. She exhibits no distension.  Musculoskeletal:       Right lower leg: She exhibits no edema.       Left lower leg: She exhibits no edema.  Neurological: She is alert and oriented to person, place, and time.  Skin: Skin is warm and dry.  Psychiatric: She has a normal mood and affect. Her behavior is normal.  Nursing note and vitals reviewed.  Assessment & Plan:  1: Chronic heart failure with reduced ejection fraction- - NYHA class II - euvolemic today - not weighing daily but does have scales. Instructed to begin weighing daily and calling for an overnight weight gain of >2 pounds or a weekly weight gain of >5 pounds - weight  - not adding salt to her food. Granddaughter does most of the shopping/ cooking and has been reading food labels. Reviewed the importance of closely following a 2000mg  sodium diet and written dietary information was given to her about this - drinking ~ 30 ounces of fluid daily - BNP 09/15/18 was 398.0 - patient reports receiving her flu vaccine for this season  2: HTN- - BP  - follows with PCP in [place] - BMP 10/18/18 reviewed and showed sodium 143, potassium 3.4, creatinine 3.03 and GFR 15  3: DM- - glucose at home was "good" - A1c 09/21/18 was 5.8%  Patient did not bring her medications nor a list. Each medication was verbally reviewed with the patient and she was encouraged to bring the bottles to every visit to confirm accuracy of list.

## 2018-10-28 ENCOUNTER — Encounter: Payer: Self-pay | Admitting: *Deleted

## 2018-10-28 ENCOUNTER — Other Ambulatory Visit: Payer: Self-pay | Admitting: *Deleted

## 2018-10-28 NOTE — Patient Outreach (Signed)
[facility]) [place] Telephone Outreach EMMI red-flag notification, post-hospital discharge day # 10 Unsuccessful call attempt  10/28/2018  [name] 08-20-1926 [id number]  Unsuccessful telephone outreach attempt to caregiver/ granddaughter Lishel for patient [name], 84 y/o female referred to [facility] after recent hospitalization November 16-19, 2019 for acute on chronic CHF exacerbation.   Patient previously requested that calls be directed to her granddaughter Lishel.  Received notification from [place] of EMMI-red flag trigger that patient is not completing daily weights and missed her scheduled appointment with the heart failure clinic on October 26, 2018.  HIPAA compliant voice mail message left for patient's caregiver, requesting return call back.  Plan:  Will make patient's primary [place] RN CM aware of today's unsuccessful telephone outreach   Oneta Rack, RN, BSN, [employer] Coordinator [facility] Care Management  [phone]

## 2018-10-31 ENCOUNTER — Other Ambulatory Visit: Payer: Self-pay | Admitting: *Deleted

## 2018-10-31 NOTE — Patient Outreach (Addendum)
[facility]) Care Management  10/31/2018  [name] December 10, 1925 [id number]   Notified that member triggered red on EMMI heart failure dashboard yesterday for not weighing self.  Call placed to [facility] granddaughter.  She report they are having a hard time getting member to do her daily weights.  State member is aware that it need to be done and the reason, but still not doing it.  She does have working scale.  Granddaughter believe reinforcement from [facility] would benefit member.  She has MD appointment with cardiology on 12/4, agree to home visit next week.    THN CM Care Plan Problem One     Most Recent Value  Care Plan Problem One  Risk for readmission related to non-compliance of heart failure management  Role Documenting the Problem One  Care Management [place] for Problem One  Active  [facility] Long Term Goal   member will not be readmitted over the next 45 days  THN Long Term Goal Start Date  10/31/18  Interventions for Problem One Long Term Goal  Schedule face to face visit with member. Reviewed discharge instructions with member's granddaughter  THN CM Short Term Goal #1   Member will weigh daily for next 4 weeks  THN CM Short Term Goal #1 Start Date  10/31/18  Interventions for Short Term Goal #1  Educated granddaughter on importance of weighing member daily. Educated on monitoring weights at same time in the mornings before meals and after using bathroom  THN CM Short Term Goal #2   Member will attend Cardiology appointment as scheduled within next week  [facility] CM Short Term Goal #2 Start Date  10/31/18  Interventions for Short Term Goal #2  Reviewed member's upcoming appointments with granddaughter. Transportation confirmed and granddaughter will transport.     Valente David, [place], MSN [place] [phone]

## 2018-11-01 ENCOUNTER — Other Ambulatory Visit: Payer: Medicare Other

## 2018-11-01 ENCOUNTER — Ambulatory Visit: Payer: Medicare Other | Admitting: Oncology

## 2018-11-01 ENCOUNTER — Ambulatory Visit: Payer: Medicare Other

## 2018-11-01 DIAGNOSIS — I2510 Atherosclerotic heart disease of native coronary artery without angina pectoris: Secondary | ICD-10-CM | POA: Diagnosis not present

## 2018-11-01 DIAGNOSIS — K219 Gastro-esophageal reflux disease without esophagitis: Secondary | ICD-10-CM | POA: Diagnosis not present

## 2018-11-01 DIAGNOSIS — M1990 Unspecified osteoarthritis, unspecified site: Secondary | ICD-10-CM | POA: Diagnosis not present

## 2018-11-01 DIAGNOSIS — I130 Hypertensive heart and chronic kidney disease with heart failure and stage 1 through stage 4 chronic kidney disease, or unspecified chronic kidney disease: Secondary | ICD-10-CM | POA: Diagnosis not present

## 2018-11-01 DIAGNOSIS — Z89412 Acquired absence of left great toe: Secondary | ICD-10-CM | POA: Diagnosis not present

## 2018-11-01 DIAGNOSIS — Z7902 Long term (current) use of antithrombotics/antiplatelets: Secondary | ICD-10-CM | POA: Diagnosis not present

## 2018-11-01 DIAGNOSIS — I429 Cardiomyopathy, unspecified: Secondary | ICD-10-CM | POA: Diagnosis not present

## 2018-11-01 DIAGNOSIS — E1151 Type 2 diabetes mellitus with diabetic peripheral angiopathy without gangrene: Secondary | ICD-10-CM | POA: Diagnosis not present

## 2018-11-01 DIAGNOSIS — Z7984 Long term (current) use of oral hypoglycemic drugs: Secondary | ICD-10-CM | POA: Diagnosis not present

## 2018-11-01 DIAGNOSIS — Z7982 Long term (current) use of aspirin: Secondary | ICD-10-CM | POA: Diagnosis not present

## 2018-11-01 DIAGNOSIS — E1122 Type 2 diabetes mellitus with diabetic chronic kidney disease: Secondary | ICD-10-CM | POA: Diagnosis not present

## 2018-11-01 DIAGNOSIS — E785 Hyperlipidemia, unspecified: Secondary | ICD-10-CM | POA: Diagnosis not present

## 2018-11-01 DIAGNOSIS — I083 Combined rheumatic disorders of mitral, aortic and tricuspid valves: Secondary | ICD-10-CM | POA: Diagnosis not present

## 2018-11-01 DIAGNOSIS — Z8673 Personal history of transient ischemic attack (TIA), and cerebral infarction without residual deficits: Secondary | ICD-10-CM | POA: Diagnosis not present

## 2018-11-01 DIAGNOSIS — N184 Chronic kidney disease, stage 4 (severe): Secondary | ICD-10-CM | POA: Diagnosis not present

## 2018-11-01 DIAGNOSIS — I5042 Chronic combined systolic (congestive) and diastolic (congestive) heart failure: Secondary | ICD-10-CM | POA: Diagnosis not present

## 2018-11-01 DIAGNOSIS — I700 Atherosclerosis of aorta: Secondary | ICD-10-CM | POA: Diagnosis not present

## 2018-11-01 DIAGNOSIS — D631 Anemia in chronic kidney disease: Secondary | ICD-10-CM | POA: Diagnosis not present

## 2018-11-01 DIAGNOSIS — Z95820 Peripheral vascular angioplasty status with implants and grafts: Secondary | ICD-10-CM | POA: Diagnosis not present

## 2018-11-01 DIAGNOSIS — D500 Iron deficiency anemia secondary to blood loss (chronic): Secondary | ICD-10-CM | POA: Diagnosis not present

## 2018-11-02 ENCOUNTER — Ambulatory Visit (INDEPENDENT_AMBULATORY_CARE_PROVIDER_SITE_OTHER): Payer: Medicare Other | Admitting: Physician Assistant

## 2018-11-02 ENCOUNTER — Encounter: Payer: Self-pay | Admitting: Physician Assistant

## 2018-11-02 VITALS — BP 102/72 | HR 72 | Ht 65.5 in | Wt 114.4 lb

## 2018-11-02 DIAGNOSIS — Z79899 Other long term (current) drug therapy: Secondary | ICD-10-CM

## 2018-11-02 DIAGNOSIS — I739 Peripheral vascular disease, unspecified: Secondary | ICD-10-CM

## 2018-11-02 DIAGNOSIS — I5042 Chronic combined systolic (congestive) and diastolic (congestive) heart failure: Secondary | ICD-10-CM | POA: Diagnosis not present

## 2018-11-02 DIAGNOSIS — N184 Chronic kidney disease, stage 4 (severe): Secondary | ICD-10-CM

## 2018-11-02 DIAGNOSIS — D649 Anemia, unspecified: Secondary | ICD-10-CM | POA: Diagnosis not present

## 2018-11-02 DIAGNOSIS — E785 Hyperlipidemia, unspecified: Secondary | ICD-10-CM | POA: Diagnosis not present

## 2018-11-02 DIAGNOSIS — I10 Essential (primary) hypertension: Secondary | ICD-10-CM

## 2018-11-02 NOTE — Progress Notes (Signed)
Cardiology Office Note    Date:  11/04/2018   ID:  [name], DOB 03-Sep-1926, MRN [id number]  PCP:  Alexa Lei, MD  Cardiologist:  Dr. Debara Pickett   Chief Complaint  Patient presents with  . Follow-up    seen for Dr. Debara Pickett    History of Present Illness:  [name] is a 84 y.o. female with past medical history of HTN, HLD, anemia of chronic disease, CKD and PVD s/p left femoral enterectomy.  Patient was admitted in October with chest pain and shortness of breath.  D-dimer was elevated at 1.16, hemoglobin low at 8.8.  Patient was treated with IV Lasix.  Echocardiogram showed newly depressed LVEF of 20 to 25% with suspicion for Takotsubo cardiomyopathy or multivessel CAD.  Invasive evaluation with cardiac catheterization was deferred given her comorbidities.  She was discharged on 10/15 however he returned to [name] on 09/15/2018.  She was treated further with IV Lasix.  Her discharge weight on 10/19 was 55.5 kg.  Patient returned to the hospital for another admission 3 days later with sudden onset of slurred speech, expressive aphasia and left-sided facial droop.  Stroke work-up was negative with negative MRI of the brain.  Patient was discharged on 325 mg aspirin on top of Plavix.  Echocardiogram obtained on 09/21/2018 showed EF 25 to 30%, akinesis of mid-apical myocardium, PA peak pressure 35 mmHg.  Since discharge, patient was seen in the [facility] heart failure clinic on 09/28/2018.  Most recently, patient was readmitted from 11/16-11/19 with acute on chronic systolic heart failure.  Volume status improved however her creatinine trended up to 3.02.  She was discharged on 40 mg twice daily of Lasix.  Her new discharge weight was 51.9 kg or 114.4 lbs. She did not show for her most recent heart failure clinic visit on 10/26/2018.  Patient presents today for cardiology office visit.  Her weight has been stable since discharge.  Her breathing is also stable as well.  She  does not JVD on physical exam.  Her lung is clear.  I will continue her on the current dose of diuretic.  Unfortunately, I can no longer up to uptitrate her heart failure medication as her blood pressure is borderline.  I will continue her on carvedilol.    Past Medical History:  Diagnosis Date  . ACS (acute coronary syndrome) ([place]) 08/2018  . Anemia    family unsure of this history  . Arthritis   . CHF (congestive heart failure) ([place])   . Chronic ulcer of calf (HCC)    Left  . Constipation   . Diabetes mellitus without complication ([place])   . Dyslipidemia   . Dyspnea   . GERD (gastroesophageal reflux disease)   . Hyperlipidemia   . Hypertension   . Peripheral vascular disease [facility])     Past Surgical History:  Procedure Laterality Date  . ABDOMINAL AORTOGRAM W/LOWER EXTREMITY Left 12/27/2017   Procedure: ABDOMINAL AORTOGRAM W/LOWER EXTREMITY;  Surgeon: Algernon Huxley, MD;  Location: [place] CV LAB;  Service: Cardiovascular;  Laterality: Left;  . ABDOMINAL HYSTERECTOMY    . AMPUTATION TOE Left 12/23/2017   Procedure: AMPUTATION TOE LEFT GREAT TOE;  Surgeon: Sharlotte Alamo, DPM;  Location: [facility] ORS;  Service: Podiatry;  Laterality: Left;  . ENDARTERECTOMY FEMORAL Left 09/08/2017   Procedure: ENDARTERECTOMY FEMORAL WITH CORMATRIX PATCH;  Surgeon: Algernon Huxley, MD;  Location: [facility] ORS;  Service: Vascular;  Laterality: Left;  . EYE SURGERY Bilateral  Cataract Extraction with IOL  . LOWER EXTREMITY ANGIOGRAPHY Left 07/22/2017   Procedure: Lower Extremity Angiography;  Surgeon: Algernon Huxley, MD;  Location: [place] CV LAB;  Service: Cardiovascular;  Laterality: Left;  . LOWER EXTREMITY ANGIOGRAPHY Left 08/16/2017   Procedure: Lower Extremity Angiography;  Surgeon: Algernon Huxley, MD;  Location: [place] CV LAB;  Service: Cardiovascular;  Laterality: Left;  Marland Kitchen MULTIPLE TOOTH EXTRACTIONS      Current Medications: Outpatient Medications Prior to Visit  Medication Sig Dispense  Refill  . acetaminophen (TYLENOL) 500 MG tablet Take 1 tablet (500 mg total) by mouth daily.    Marland Kitchen aspirin EC 81 MG tablet Take 1 tablet (81 mg total) by mouth daily.    Marland Kitchen atorvastatin (LIPITOR) 10 MG tablet Take 1 tablet (10 mg total) by mouth daily. 30 tablet 1  . Blood Glucose Monitoring Suppl (ACCU-CHEK GUIDE) w/Device KIT See admin instructions.  0  . Calcium Carbonate-Vitamin D (CALCIUM 500 + D) 500-125 MG-UNIT TABS Take 1 tablet by mouth daily. Gummie    . carvedilol (COREG) 12.5 MG tablet Take 1 tablet (12.5 mg total) by mouth 2 (two) times daily with a meal. 60 tablet 2  . clopidogrel (PLAVIX) 75 MG tablet Take 1 tablet (75 mg total) by mouth daily with breakfast. 30 tablet 2  . furosemide (LASIX) 40 MG tablet Take 1 tablet (40 mg total) by mouth 2 (two) times daily. 60 tablet 0  . glipiZIDE (GLUCOTROL) 5 MG tablet Take 0.5 tablets (2.5 mg total) by mouth 2 (two) times daily before a meal. Always take with food 60 tablet 2  . hydrOXYzine (ATARAX/VISTARIL) 10 MG tablet Take 1 tablet (10 mg total) by mouth 3 (three) times daily as needed for anxiety. 30 tablet 0  . pantoprazole (PROTONIX) 20 MG tablet Take 1 tablet (20 mg total) by mouth daily. 30 tablet 1  . polyethylene glycol (MIRALAX / GLYCOLAX) packet Take 17 g by mouth daily as needed (for constipation.).      No facility-administered medications prior to visit.      Allergies:   Patient has no known allergies.   Social History   Socioeconomic History  . Marital status: Legally Separated    Spouse name: Not on file  . Number of children: Not on file  . Years of education: Not on file  . Highest education level: Not on file  Occupational History  . Not on file  Social Needs  . Financial resource strain: Not on file  . Food insecurity:    Worry: Not on file    Inability: Not on file  . Transportation needs:    Medical: Not on file    Non-medical: Not on file  Tobacco Use  . Smoking status: Never Smoker  . Smokeless  tobacco: Never Used  Substance and Sexual Activity  . Alcohol use: No  . Drug use: No  . Sexual activity: Not Currently  Lifestyle  . Physical activity:    Days per week: Not on file    Minutes per session: Not on file  . Stress: Not on file  Relationships  . Social connections:    Talks on phone: Not on file    Gets together: Not on file    Attends religious service: Not on file    Active member of club or organization: Not on file    Attends meetings of clubs or organizations: Not on file    Relationship status: Not on file  Other Topics Concern  .  Not on file  Social History Narrative  . Not on file     Family History:  The patient's family history includes Hypertension in her father.   ROS:   Please see the history of present illness.    ROS All other systems reviewed and are negative.   PHYSICAL EXAM:   VS:  BP 102/72   Pulse 72   Ht 5' 5.5" (1.664 m)   Wt 114 lb 6.4 oz (51.9 kg)   BMI 18.75 kg/m    GEN: Well nourished, well developed, in no acute distress  HEENT: normal  Neck: no JVD, carotid bruits, or masses Cardiac: RRR; no murmurs, rubs, or gallops. RLE edema, no LLE  Respiratory:  clear to auscultation bilaterally, normal work of breathing GI: soft, nontender, nondistended, + BS MS: no deformity or atrophy  Skin: warm and dry, no rash Neuro:  Alert and Oriented x 3, Strength and sensation are intact Psych: euthymic mood, full affect  Wt Readings from Last 3 Encounters:  11/02/18 114 lb 6.4 oz (51.9 kg)  10/21/18 115 lb (52.2 kg)  10/18/18 113 lb (51.3 kg)      Studies/Labs Reviewed:   EKG:  EKG is not ordered today.  Recent Labs: 10/15/2018: ALT 18; B Natriuretic Peptide 2,046.5 10/17/2018: Magnesium 2.4 11/02/2018: BUN 35; Creatinine, Ser 2.95; Hemoglobin 10.4; Platelets 272; Potassium 5.4; Sodium 143   Lipid Panel    Component Value Date/Time   CHOL 121 09/21/2018 0356   TRIG 91 09/21/2018 0356   HDL 43 09/21/2018 0356   CHOLHDL 2.8  09/21/2018 0356   VLDL 18 09/21/2018 0356   LDLCALC 60 09/21/2018 0356    Additional studies/ records that were reviewed today include:   Echo 09/09/18 Study Conclusions  - Procedure narrative: Transthoracic echocardiography. Image quality was adequate. The study was technically difficult, as a result of poor sound wave transmission. Intravenous contrast (Definity) was administered. - Left ventricle: The cavity size was normal. Wall thickness was increased in a pattern of mild LVH. Systolic function was severely reduced. The estimated ejection fraction was in the range of 20% to 25%. No apical thrombus with Definity contrast. Severe apical hypokinesis with preserved basal function, suggestive of possible Takatsubo cardiomyopathy or multivessel coronary artery disease. Doppler parameters are consistent with abnormal left ventricular relaxation (grade 1 diastolic dysfunction). LV filling pressures are markedly elevated. - Aortic valve: Sclerosis without stenosis. There was mild regurgitation. Valve area (Vmax): 1.28 cm^2. - Mitral valve: Mildly thickened leaflets . Sclerotic anterior leaflet. There was mild to moderate regurgitation. - Left atrium: The atrium was normal in size. - Tricuspid valve: There was moderate regurgitation. - Pulmonary arteries: PA peak pressure: 56 mm Hg (S). - Inferior vena cava: The vessel was dilated. The respirophasic diameter changes were blunted (<50%), consistent with elevated central venous pressure. - Pericardium, extracardiac: There was no pericardial effusion. There was a left pleural effusion.  Impressions:  - Technically difficult study. Definity contrast given. Compared to a prior study in 2015, the LVEF is markedly lower at 20-25% with predominantly mid to distal anterior, apical, inferior and lateral wall hypokinesis with a preserved basal function, suggestive of possible Takatsubo  cardimyopathy or multivessel coronary artery disease. LV filling pressure is very high. RVSP is 56 mmHg with a dilated IVC and left pleural effusion.     Echo 09/21/2018 LV EF: 25% -   30% Study Conclusions  - Left ventricle: The cavity size was normal. Wall thickness was   increased in a pattern  of mild LVH. There was focal basal   hypertrophy. Systolic function was severely reduced. The   estimated ejection fraction was in the range of 25% to 30%.   Akinesis of the mid-apicalapical myocardium. - Mitral valve: There was moderate to severe regurgitation. - Pulmonary arteries: Systolic pressure was mildly increased. PA   peak pressure: 35 mm Hg (S). - Pericardium, extracardiac: There was a left pleural effusion.    ASSESSMENT:    1. Chronic combined systolic and diastolic congestive heart failure ([place])   2. Medication management   3. Anemia, unspecified type   4. Essential hypertension   5. Hyperlipidemia, unspecified hyperlipidemia type   6. PAD (peripheral artery disease) ([place])   7. Chronic kidney disease (CKD), stage IV (severe) (HCC)      PLAN:  In order of problems listed above:  1. Chronic combined systolic and diastolic heart failure: She is euvolemic on physical exam.  Her heart current weight is her recent discharge weight.  We will continue on the current diuretic  -We discussed the importance of salt restriction, fluid restriction and weight monitoring.  2. Anemia: Obtain CBC  3. Hypertension: Blood pressure stable on current medication  4. Hyperlipidemia: Managed by primary care provider  5. PAD: Denies any significant lower extremity pain  6. CKD stage III: Obtain basic metabolic panel.    Medication Adjustments/Labs and Tests Ordered: Current medicines are reviewed at length with the patient today.  Concerns regarding medicines are outlined above.  Medication changes, Labs and Tests ordered today are listed in the Patient Instructions  below. Patient Instructions  Medication Instructions:  Your Physician recommend you continue on your current medication as directed.    If you need a refill on your cardiac medications before your next appointment, please call your pharmacy.   Lab work: Your physician recommends that you return for lab work today ( CBC, BMP)  If you have labs (blood work) drawn today and your tests are completely normal, you will receive your results only by: Marland Kitchen MyChart Message (if you have MyChart) OR . A paper copy in the mail If you have any lab test that is abnormal or we need to change your treatment, we will call you to review the results.  Testing/Procedures: None  Follow-Up: At [facility], you and your health needs are our priority.  As part of our continuing mission to provide you with exceptional heart care, we have created designated Provider Care Teams.  These Care Teams include your primary Cardiologist (physician) and Advanced Practice Providers (APPs -  Physician Assistants and Nurse Practitioners) who all work together to provide you with the care you need, when you need it. You will need a follow up appointment in 6 weeks. You may see Pixie Casino, MD or one of the following Advanced Practice Providers on your designated Care Team: [place], [place] . Fabian Sharp, PA-C  Any Other Special Instructions Will Be Listed Below (If Applicable). Heart Failure Prevention Plan: Your dry weight is around 114 lbs 1. Avoid salt 2. Keep fluid intake to between 32 -48 oz per day 3. Weigh yourself daily, call cardiology if weight increase by more than 2 lbs overnight or 5 lbs in a single week.     Hilbert Corrigan, [place]  11/04/2018 11:50 PM    [facility] [place], [place], [place]  [address] Phone: [phone]; Fax: [phone]

## 2018-11-02 NOTE — Patient Instructions (Addendum)
Medication Instructions:  Your Physician recommend you continue on your current medication as directed.    If you need a refill on your cardiac medications before your next appointment, please call your pharmacy.   Lab work: Your physician recommends that you return for lab work today ( CBC, BMP)  If you have labs (blood work) drawn today and your tests are completely normal, you will receive your results only by: Marland Kitchen MyChart Message (if you have MyChart) OR . A paper copy in the mail If you have any lab test that is abnormal or we need to change your treatment, we will call you to review the results.  Testing/Procedures: None  Follow-Up: At [facility], you and your health needs are our priority.  As part of our continuing mission to provide you with exceptional heart care, we have created designated Provider Care Teams.  These Care Teams include your primary Cardiologist (physician) and Advanced Practice Providers (APPs -  Physician Assistants and Nurse Practitioners) who all work together to provide you with the care you need, when you need it. You will need a follow up appointment in 6 weeks. You may see Pixie Casino, MD or one of the following Advanced Practice Providers on your designated Care Team: [place], [place] . Fabian Sharp, PA-C  Any Other Special Instructions Will Be Listed Below (If Applicable). Heart Failure Prevention Plan: Your dry weight is around 114 lbs 1. Avoid salt 2. Keep fluid intake to between 32 -48 oz per day 3. Weigh yourself daily, call cardiology if weight increase by more than 2 lbs overnight or 5 lbs in a single week.

## 2018-11-03 DIAGNOSIS — K219 Gastro-esophageal reflux disease without esophagitis: Secondary | ICD-10-CM | POA: Diagnosis not present

## 2018-11-03 DIAGNOSIS — M1990 Unspecified osteoarthritis, unspecified site: Secondary | ICD-10-CM | POA: Diagnosis not present

## 2018-11-03 DIAGNOSIS — Z7984 Long term (current) use of oral hypoglycemic drugs: Secondary | ICD-10-CM | POA: Diagnosis not present

## 2018-11-03 DIAGNOSIS — N184 Chronic kidney disease, stage 4 (severe): Secondary | ICD-10-CM | POA: Diagnosis not present

## 2018-11-03 DIAGNOSIS — Z7982 Long term (current) use of aspirin: Secondary | ICD-10-CM | POA: Diagnosis not present

## 2018-11-03 DIAGNOSIS — I5042 Chronic combined systolic (congestive) and diastolic (congestive) heart failure: Secondary | ICD-10-CM | POA: Diagnosis not present

## 2018-11-03 DIAGNOSIS — D500 Iron deficiency anemia secondary to blood loss (chronic): Secondary | ICD-10-CM | POA: Diagnosis not present

## 2018-11-03 DIAGNOSIS — I130 Hypertensive heart and chronic kidney disease with heart failure and stage 1 through stage 4 chronic kidney disease, or unspecified chronic kidney disease: Secondary | ICD-10-CM | POA: Diagnosis not present

## 2018-11-03 DIAGNOSIS — Z95820 Peripheral vascular angioplasty status with implants and grafts: Secondary | ICD-10-CM | POA: Diagnosis not present

## 2018-11-03 DIAGNOSIS — I429 Cardiomyopathy, unspecified: Secondary | ICD-10-CM | POA: Diagnosis not present

## 2018-11-03 DIAGNOSIS — I700 Atherosclerosis of aorta: Secondary | ICD-10-CM | POA: Diagnosis not present

## 2018-11-03 DIAGNOSIS — D631 Anemia in chronic kidney disease: Secondary | ICD-10-CM | POA: Diagnosis not present

## 2018-11-03 DIAGNOSIS — Z89412 Acquired absence of left great toe: Secondary | ICD-10-CM | POA: Diagnosis not present

## 2018-11-03 DIAGNOSIS — I083 Combined rheumatic disorders of mitral, aortic and tricuspid valves: Secondary | ICD-10-CM | POA: Diagnosis not present

## 2018-11-03 DIAGNOSIS — E785 Hyperlipidemia, unspecified: Secondary | ICD-10-CM | POA: Diagnosis not present

## 2018-11-03 DIAGNOSIS — I2510 Atherosclerotic heart disease of native coronary artery without angina pectoris: Secondary | ICD-10-CM | POA: Diagnosis not present

## 2018-11-03 DIAGNOSIS — Z8673 Personal history of transient ischemic attack (TIA), and cerebral infarction without residual deficits: Secondary | ICD-10-CM | POA: Diagnosis not present

## 2018-11-03 DIAGNOSIS — E1122 Type 2 diabetes mellitus with diabetic chronic kidney disease: Secondary | ICD-10-CM | POA: Diagnosis not present

## 2018-11-03 DIAGNOSIS — E1151 Type 2 diabetes mellitus with diabetic peripheral angiopathy without gangrene: Secondary | ICD-10-CM | POA: Diagnosis not present

## 2018-11-03 DIAGNOSIS — Z7902 Long term (current) use of antithrombotics/antiplatelets: Secondary | ICD-10-CM | POA: Diagnosis not present

## 2018-11-03 LAB — CBC
HEMATOCRIT: 33.6 % — AB (ref 34.0–46.6)
Hemoglobin: 10.4 g/dL — ABNORMAL LOW (ref 11.1–15.9)
MCH: 23.6 pg — ABNORMAL LOW (ref 26.6–33.0)
MCHC: 31 g/dL — ABNORMAL LOW (ref 31.5–35.7)
MCV: 76 fL — AB (ref 79–97)
Platelets: 272 10*3/uL (ref 150–450)
RBC: 4.41 x10E6/uL (ref 3.77–5.28)
RDW: 17.5 % — ABNORMAL HIGH (ref 12.3–15.4)
WBC: 4.9 10*3/uL (ref 3.4–10.8)

## 2018-11-03 LAB — BASIC METABOLIC PANEL
BUN / CREAT RATIO: 12 (ref 12–28)
BUN: 35 mg/dL (ref 10–36)
CO2: 21 mmol/L (ref 20–29)
Calcium: 9.7 mg/dL (ref 8.7–10.3)
Chloride: 101 mmol/L (ref 96–106)
Creatinine, Ser: 2.95 mg/dL — ABNORMAL HIGH (ref 0.57–1.00)
GFR, EST AFRICAN AMERICAN: 15 mL/min/{1.73_m2} — AB (ref >59)
GFR, EST NON AFRICAN AMERICAN: 13 mL/min/{1.73_m2} — AB (ref >59)
Glucose: 121 mg/dL — ABNORMAL HIGH (ref 65–99)
POTASSIUM: 5.4 mmol/L — AB (ref 3.5–5.2)
Sodium: 143 mmol/L (ref 134–144)

## 2018-11-04 ENCOUNTER — Other Ambulatory Visit: Payer: Self-pay | Admitting: Physician Assistant

## 2018-11-04 ENCOUNTER — Encounter: Payer: Self-pay | Admitting: Physician Assistant

## 2018-11-04 DIAGNOSIS — I2510 Atherosclerotic heart disease of native coronary artery without angina pectoris: Secondary | ICD-10-CM | POA: Diagnosis not present

## 2018-11-04 DIAGNOSIS — M1990 Unspecified osteoarthritis, unspecified site: Secondary | ICD-10-CM | POA: Diagnosis not present

## 2018-11-04 DIAGNOSIS — I083 Combined rheumatic disorders of mitral, aortic and tricuspid valves: Secondary | ICD-10-CM | POA: Diagnosis not present

## 2018-11-04 DIAGNOSIS — Z7982 Long term (current) use of aspirin: Secondary | ICD-10-CM | POA: Diagnosis not present

## 2018-11-04 DIAGNOSIS — I5042 Chronic combined systolic (congestive) and diastolic (congestive) heart failure: Secondary | ICD-10-CM | POA: Diagnosis not present

## 2018-11-04 DIAGNOSIS — Z7902 Long term (current) use of antithrombotics/antiplatelets: Secondary | ICD-10-CM | POA: Diagnosis not present

## 2018-11-04 DIAGNOSIS — I429 Cardiomyopathy, unspecified: Secondary | ICD-10-CM | POA: Diagnosis not present

## 2018-11-04 DIAGNOSIS — E875 Hyperkalemia: Secondary | ICD-10-CM

## 2018-11-04 DIAGNOSIS — Z89412 Acquired absence of left great toe: Secondary | ICD-10-CM | POA: Diagnosis not present

## 2018-11-04 DIAGNOSIS — Z7984 Long term (current) use of oral hypoglycemic drugs: Secondary | ICD-10-CM | POA: Diagnosis not present

## 2018-11-04 DIAGNOSIS — D631 Anemia in chronic kidney disease: Secondary | ICD-10-CM | POA: Diagnosis not present

## 2018-11-04 DIAGNOSIS — E1122 Type 2 diabetes mellitus with diabetic chronic kidney disease: Secondary | ICD-10-CM | POA: Diagnosis not present

## 2018-11-04 DIAGNOSIS — Z95820 Peripheral vascular angioplasty status with implants and grafts: Secondary | ICD-10-CM | POA: Diagnosis not present

## 2018-11-04 DIAGNOSIS — I130 Hypertensive heart and chronic kidney disease with heart failure and stage 1 through stage 4 chronic kidney disease, or unspecified chronic kidney disease: Secondary | ICD-10-CM | POA: Diagnosis not present

## 2018-11-04 DIAGNOSIS — E1151 Type 2 diabetes mellitus with diabetic peripheral angiopathy without gangrene: Secondary | ICD-10-CM | POA: Diagnosis not present

## 2018-11-04 DIAGNOSIS — N184 Chronic kidney disease, stage 4 (severe): Secondary | ICD-10-CM | POA: Diagnosis not present

## 2018-11-04 DIAGNOSIS — I700 Atherosclerosis of aorta: Secondary | ICD-10-CM | POA: Diagnosis not present

## 2018-11-04 DIAGNOSIS — E785 Hyperlipidemia, unspecified: Secondary | ICD-10-CM | POA: Diagnosis not present

## 2018-11-04 DIAGNOSIS — D500 Iron deficiency anemia secondary to blood loss (chronic): Secondary | ICD-10-CM | POA: Diagnosis not present

## 2018-11-04 DIAGNOSIS — K219 Gastro-esophageal reflux disease without esophagitis: Secondary | ICD-10-CM | POA: Diagnosis not present

## 2018-11-04 DIAGNOSIS — Z8673 Personal history of transient ischemic attack (TIA), and cerebral infarction without residual deficits: Secondary | ICD-10-CM | POA: Diagnosis not present

## 2018-11-07 ENCOUNTER — Other Ambulatory Visit: Payer: Self-pay

## 2018-11-07 NOTE — Patient Outreach (Addendum)
[facility]) Care Management   11/07/2018  [name] 1926-11-18 [id number]  [name] is an 84 y.o. female  Subjective: Member alert and oriented x4. Denies pain. Denies use of mobile meals. Verified appointments with member and member's granddaughter: PCP 11-09-2018; Neurology 11-17-2018; Cardiology 12-15-2018 and last appointment 11-02-2018  Objective:   Review of Systems  Constitutional: Negative.   HENT: Negative.        At times with oxygen use.   Eyes: Negative.   Respiratory: Negative.   Cardiovascular: Negative.   Gastrointestinal: Negative.   Genitourinary: Negative.   Musculoskeletal: Positive for joint pain.       R shoulder  Skin: Negative.   Neurological: Negative.   Endo/Heme/Allergies: Bruises/bleeds easily.  Psychiatric/Behavioral: Negative.     Physical Exam  Constitutional: She is oriented to person, place, and time. She appears well-developed.  Cardiovascular: Normal rate, regular rhythm and normal heart sounds.  Respiratory: Effort normal and breath sounds normal.  GI: Soft. Bowel sounds are normal.  Neurological: She is alert and oriented to person, place, and time.  . BP 122/72 (BP Location: Right Arm, Patient Position: Sitting, Cuff Size: Normal)   Pulse 68   Resp (!) 24   Ht 1.651 m (5' 5" )   Wt 120 lb (54.4 kg)   SpO2 96%   BMI 19.97 kg/m   Encounter Medications:   Outpatient Encounter Medications as of 11/07/2018  Medication Sig Note  . acetaminophen (TYLENOL) 500 MG tablet Take 1 tablet (500 mg total) by mouth daily.   Marland Kitchen atorvastatin (LIPITOR) 10 MG tablet Take 1 tablet (10 mg total) by mouth daily.   . Blood Glucose Monitoring Suppl (ACCU-CHEK GUIDE) w/Device KIT See admin instructions.   . Calcium Carbonate-Vitamin D (CALCIUM 500 + D) 500-125 MG-UNIT TABS Take 1 tablet by mouth daily. Gummie   . carvedilol (COREG) 12.5 MG tablet Take 1 tablet (12.5 mg total) by mouth 2 (two) times daily with a meal.   .  clopidogrel (PLAVIX) 75 MG tablet Take 1 tablet (75 mg total) by mouth daily with breakfast.   . furosemide (LASIX) 40 MG tablet Take 1 tablet (40 mg total) by mouth 2 (two) times daily.   Marland Kitchen glipiZIDE (GLUCOTROL) 5 MG tablet Take 0.5 tablets (2.5 mg total) by mouth 2 (two) times daily before a meal. Always take with food   . hydrOXYzine (ATARAX/VISTARIL) 10 MG tablet Take 1 tablet (10 mg total) by mouth 3 (three) times daily as needed for anxiety.   . pantoprazole (PROTONIX) 20 MG tablet Take 1 tablet (20 mg total) by mouth daily.   . polyethylene glycol (MIRALAX / GLYCOLAX) packet Take 17 g by mouth daily as needed (for constipation.).    Marland Kitchen aspirin EC 81 MG tablet Take 1 tablet (81 mg total) by mouth daily. 11/07/2018: Taking 357m   No facility-administered encounter medications on file as of 11/07/2018.     Functional Status:   In your present state of health, do you have any difficulty performing the following activities: 11/07/2018 10/15/2018  Hearing? N N  Comment - -  Vision? N N  Difficulty concentrating or making decisions? Y N  Walking or climbing stairs? Y N  Dressing or bathing? N Y  Doing errands, shopping? Y N  Preparing Food and eating ? N -  Using the Toilet? N -  In the past six months, have you accidently leaked urine? N -  Do you have problems with loss of bowel control? N -  Managing your Medications? Y -  Comment family places meds in pill box and member then able to take own meds -  Managing your Finances? Y -  Comment family pays bills -  Housekeeping or managing your Housekeeping? Y -  Comment member able to do laundry at times; family assists -  Some recent data might be hidden    Fall/Depression Screening:    Fall Risk  11/07/2018 09/28/2018  Falls in the past year? 0 No  Number falls in past yr: 0 -  Injury with Fall? 0 -  Risk for fall due to : History of fall(s) -  Follow up Falls evaluation completed -   PHQ 2/9 Scores 11/07/2018 09/28/2018  PHQ - 2  Score 0 0    Assessment:  Home visit with member and member's granddaughter. Educated family concerning heart failure; importance of weighing daily; when to call MD and 911. Member states she is able to provide own ADLs to include laundry and meal preparation; however, family assists as well and stated that family assists with IADLs, such as shopping, paying bills, cleaning home. Member states ability to ambulate with assistance of cane/walker. Denies recent falls. Member/grandaughter stated member participates in therapy with [facility].   Plan:  Family requests assistance with bathroom modification. Will initiate SW referral.   Will follow up with member in 2 weeks; member and member's granddaughter verbalized agreement.  Advised to contact Case Manager with any questions or concerns. Contact information provided.  THN CM Care Plan Problem One     Most Recent Value  Care Plan Problem One  Risk for readmission related to non-compliance of heart failure management  Role Documenting the Problem One  Care Management [place] for Problem One  Active  THN Long Term Goal   member will not be readmitted over the next 41 days  THN Long Term Goal Start Date  10/31/18  Interventions for Problem One Long Term Goal  Educated member and granddaughter on 46 hour nurse triage line,  Educated concerning s/s of heart failure and stroke,   THN CM Short Term Goal #1   Member will weigh daily for next 4 weeks  THN CM Short Term Goal #1 Start Date  10/31/18  Interventions for Short Term Goal #1  Educated member and granddaughter concerning importance of weighing self daily,  should weight increase 3lbs in a day or 5 lbs in a week to notify PCP,  document weights, same time, same scale, same amount clothing  THN CM Short Term Goal #2   Member will attend Cardiology appointment as scheduled within next week  [facility] CM Short Term Goal #2 Start Date  10/31/18  [facility] CM Short Term Goal #2 Met Date   11/07/18      Benjamine MolaANN" [name], RN-BSN  [facility]  Community Care Management Coordinator  [phone] [name]

## 2018-11-08 ENCOUNTER — Other Ambulatory Visit: Payer: Self-pay

## 2018-11-08 DIAGNOSIS — Z89412 Acquired absence of left great toe: Secondary | ICD-10-CM | POA: Diagnosis not present

## 2018-11-08 DIAGNOSIS — Z7902 Long term (current) use of antithrombotics/antiplatelets: Secondary | ICD-10-CM | POA: Diagnosis not present

## 2018-11-08 DIAGNOSIS — M1990 Unspecified osteoarthritis, unspecified site: Secondary | ICD-10-CM | POA: Diagnosis not present

## 2018-11-08 DIAGNOSIS — I083 Combined rheumatic disorders of mitral, aortic and tricuspid valves: Secondary | ICD-10-CM | POA: Diagnosis not present

## 2018-11-08 DIAGNOSIS — Z7982 Long term (current) use of aspirin: Secondary | ICD-10-CM | POA: Diagnosis not present

## 2018-11-08 DIAGNOSIS — D500 Iron deficiency anemia secondary to blood loss (chronic): Secondary | ICD-10-CM | POA: Diagnosis not present

## 2018-11-08 DIAGNOSIS — E1122 Type 2 diabetes mellitus with diabetic chronic kidney disease: Secondary | ICD-10-CM | POA: Diagnosis not present

## 2018-11-08 DIAGNOSIS — Z8673 Personal history of transient ischemic attack (TIA), and cerebral infarction without residual deficits: Secondary | ICD-10-CM | POA: Diagnosis not present

## 2018-11-08 DIAGNOSIS — E1151 Type 2 diabetes mellitus with diabetic peripheral angiopathy without gangrene: Secondary | ICD-10-CM | POA: Diagnosis not present

## 2018-11-08 DIAGNOSIS — I429 Cardiomyopathy, unspecified: Secondary | ICD-10-CM | POA: Diagnosis not present

## 2018-11-08 DIAGNOSIS — I5042 Chronic combined systolic (congestive) and diastolic (congestive) heart failure: Secondary | ICD-10-CM | POA: Diagnosis not present

## 2018-11-08 DIAGNOSIS — E785 Hyperlipidemia, unspecified: Secondary | ICD-10-CM | POA: Diagnosis not present

## 2018-11-08 DIAGNOSIS — I700 Atherosclerosis of aorta: Secondary | ICD-10-CM | POA: Diagnosis not present

## 2018-11-08 DIAGNOSIS — N184 Chronic kidney disease, stage 4 (severe): Secondary | ICD-10-CM | POA: Diagnosis not present

## 2018-11-08 DIAGNOSIS — I2510 Atherosclerotic heart disease of native coronary artery without angina pectoris: Secondary | ICD-10-CM | POA: Diagnosis not present

## 2018-11-08 DIAGNOSIS — Z7984 Long term (current) use of oral hypoglycemic drugs: Secondary | ICD-10-CM | POA: Diagnosis not present

## 2018-11-08 DIAGNOSIS — D631 Anemia in chronic kidney disease: Secondary | ICD-10-CM | POA: Diagnosis not present

## 2018-11-08 DIAGNOSIS — Z95820 Peripheral vascular angioplasty status with implants and grafts: Secondary | ICD-10-CM | POA: Diagnosis not present

## 2018-11-08 DIAGNOSIS — I130 Hypertensive heart and chronic kidney disease with heart failure and stage 1 through stage 4 chronic kidney disease, or unspecified chronic kidney disease: Secondary | ICD-10-CM | POA: Diagnosis not present

## 2018-11-08 DIAGNOSIS — K219 Gastro-esophageal reflux disease without esophagitis: Secondary | ICD-10-CM | POA: Diagnosis not present

## 2018-11-08 NOTE — Addendum Note (Signed)
Addended by: Suzie Portela A on: 11/08/2018 09:09 AM   Modules accepted: Orders

## 2018-11-08 NOTE — Patient Outreach (Signed)
[facility]) Care Management  11/08/2018  [name] August 25, 1926 [id number]   Notified via Care Management Assistance that member triggered red on EMMI Heart Failure Dashboard on 11-07-2018 for new/worsening problems; new swelling; sad, hopeless, or empty.   Visited member at [employer] home yesterday for Initial Assessment. Member denies above concerns. Call to member not indicated due to home visit completed.  [name], RN-BSN  [facility] Care Management  Community Care Management Coordinator  [phone] [email or web address]

## 2018-11-09 ENCOUNTER — Other Ambulatory Visit: Payer: Self-pay

## 2018-11-09 DIAGNOSIS — I110 Hypertensive heart disease with heart failure: Secondary | ICD-10-CM | POA: Diagnosis not present

## 2018-11-09 DIAGNOSIS — E1100 Type 2 diabetes mellitus with hyperosmolarity without nonketotic hyperglycemic-hyperosmolar coma (NKHHC): Secondary | ICD-10-CM | POA: Diagnosis not present

## 2018-11-09 NOTE — Patient Outreach (Signed)
[facility]) Care Management  11/09/2018  [name] 1926/01/31 [id number]  Notified via Care Management Assistant that member triggered red on EMMI Heart Failure Dashboard on 11-08-2018 for not weighing self.  Called member to discuss weighing self. Member stated she weighed herself yesterday and weight 120lbs; however, stated she did not weigh self today due to "I got up late".   Member educated on importance of weighing self daily and encouraged to weigh self even if she gets out of bed late. Member verbalized understanding and agreement.  Will follow up within next two weeks as planned.  Alexa Mola "[name]" [name], RN-BSN  [facility] Care Management  Community Care Management Coordinator  [phone] [email or web address]

## 2018-11-09 NOTE — Patient Outreach (Signed)
[facility]) Care Management  11/09/2018  [name] [birth year] [id number]   Initial outreach to patient's granddaughter, [name], regarding social work referral for home modification.  BSW left voicemail message.  Will attempt to reach again within four business days. Unsuccessful outreach letter mailed.   Ronn Melena, BSW Social Worker [phone]

## 2018-11-10 DIAGNOSIS — K219 Gastro-esophageal reflux disease without esophagitis: Secondary | ICD-10-CM | POA: Diagnosis not present

## 2018-11-10 DIAGNOSIS — Z7902 Long term (current) use of antithrombotics/antiplatelets: Secondary | ICD-10-CM | POA: Diagnosis not present

## 2018-11-10 DIAGNOSIS — D500 Iron deficiency anemia secondary to blood loss (chronic): Secondary | ICD-10-CM | POA: Diagnosis not present

## 2018-11-10 DIAGNOSIS — E785 Hyperlipidemia, unspecified: Secondary | ICD-10-CM | POA: Diagnosis not present

## 2018-11-10 DIAGNOSIS — Z8673 Personal history of transient ischemic attack (TIA), and cerebral infarction without residual deficits: Secondary | ICD-10-CM | POA: Diagnosis not present

## 2018-11-10 DIAGNOSIS — I083 Combined rheumatic disorders of mitral, aortic and tricuspid valves: Secondary | ICD-10-CM | POA: Diagnosis not present

## 2018-11-10 DIAGNOSIS — Z7984 Long term (current) use of oral hypoglycemic drugs: Secondary | ICD-10-CM | POA: Diagnosis not present

## 2018-11-10 DIAGNOSIS — I5042 Chronic combined systolic (congestive) and diastolic (congestive) heart failure: Secondary | ICD-10-CM | POA: Diagnosis not present

## 2018-11-10 DIAGNOSIS — I130 Hypertensive heart and chronic kidney disease with heart failure and stage 1 through stage 4 chronic kidney disease, or unspecified chronic kidney disease: Secondary | ICD-10-CM | POA: Diagnosis not present

## 2018-11-10 DIAGNOSIS — Z89412 Acquired absence of left great toe: Secondary | ICD-10-CM | POA: Diagnosis not present

## 2018-11-10 DIAGNOSIS — E1151 Type 2 diabetes mellitus with diabetic peripheral angiopathy without gangrene: Secondary | ICD-10-CM | POA: Diagnosis not present

## 2018-11-10 DIAGNOSIS — I2510 Atherosclerotic heart disease of native coronary artery without angina pectoris: Secondary | ICD-10-CM | POA: Diagnosis not present

## 2018-11-10 DIAGNOSIS — I429 Cardiomyopathy, unspecified: Secondary | ICD-10-CM | POA: Diagnosis not present

## 2018-11-10 DIAGNOSIS — Z7982 Long term (current) use of aspirin: Secondary | ICD-10-CM | POA: Diagnosis not present

## 2018-11-10 DIAGNOSIS — N184 Chronic kidney disease, stage 4 (severe): Secondary | ICD-10-CM | POA: Diagnosis not present

## 2018-11-10 DIAGNOSIS — D631 Anemia in chronic kidney disease: Secondary | ICD-10-CM | POA: Diagnosis not present

## 2018-11-10 DIAGNOSIS — Z95820 Peripheral vascular angioplasty status with implants and grafts: Secondary | ICD-10-CM | POA: Diagnosis not present

## 2018-11-10 DIAGNOSIS — I700 Atherosclerosis of aorta: Secondary | ICD-10-CM | POA: Diagnosis not present

## 2018-11-10 DIAGNOSIS — E1122 Type 2 diabetes mellitus with diabetic chronic kidney disease: Secondary | ICD-10-CM | POA: Diagnosis not present

## 2018-11-10 DIAGNOSIS — M1990 Unspecified osteoarthritis, unspecified site: Secondary | ICD-10-CM | POA: Diagnosis not present

## 2018-11-11 DIAGNOSIS — I10 Essential (primary) hypertension: Secondary | ICD-10-CM | POA: Diagnosis not present

## 2018-11-11 DIAGNOSIS — R0602 Shortness of breath: Secondary | ICD-10-CM | POA: Diagnosis not present

## 2018-11-11 DIAGNOSIS — E1100 Type 2 diabetes mellitus with hyperosmolarity without nonketotic hyperglycemic-hyperosmolar coma (NKHHC): Secondary | ICD-10-CM | POA: Diagnosis not present

## 2018-11-14 ENCOUNTER — Other Ambulatory Visit: Payer: Self-pay

## 2018-11-14 NOTE — Patient Outreach (Addendum)
[name]) Care Management  11/14/2018  [name] 11/13/1926 [id number]   Received Red EMMI Flags from dates: 11-11-2018 to 11-13-2018 for c/o New Swelling and Worsening SOB. Called member concerning above complaints and member stated she did experience SOB over the weekend; however, stated she is not now experiencing SOB. Member stated she felt better after placing oxygen on. Member states she has "little" swelling in her feet and stated she weighed herself this morning and weight "121 lbs". Member stated that she spoke to her Granddaughter [name] on yesterday.   Called member's granddaughter [name] and [name] stated that a family member transported member to PCP last Thursday due to c/o SOB and stated that PCP educated member on importance of wearing oxygen, taking slow deep breaths, and relaxing. [name] denies that member has swelling in feet and stated that member has not experienced swelling in the feet since having procedure done and wearing compression socks. [name] denies any needs at this time and was made aware that Care Coordinator will call in next 2 weeks.   Will call member in 2 weeks and member aware.   Alexa Mola "[name]" [name], RN-BSN  [facility] Care Management  Community Care Management Coordinator  [phone] [name]

## 2018-11-14 NOTE — Patient Outreach (Signed)
[facility]) Care Management  11/14/2018  [name] 1926-07-31 [id number]   Successful outreach to patient's granddaughter, [name], regarding social work referral for home modification.  [name] reported that patient and another family member in the home are disabled and are currently utilizing a shower chair.  She is requesting assistance with shower modification for easier/safer accessibility.  BSW informed her of services through [employer] and [name] gave consent for referral to be submitted via [employer] [place] platform.  Referral was submitted.  BSW informed her that he may be a couple of weeks before she is contacted due to the upcoming holiday and the number of referrals they are processing.  BSW will follow up with her next week.  Ronn Melena, BSW Social Worker [phone]

## 2018-11-17 ENCOUNTER — Ambulatory Visit: Payer: Medicare Other | Admitting: Neurology

## 2018-11-17 ENCOUNTER — Other Ambulatory Visit: Payer: Self-pay

## 2018-11-17 ENCOUNTER — Encounter: Payer: Self-pay | Admitting: Neurology

## 2018-11-17 ENCOUNTER — Telehealth: Payer: Self-pay | Admitting: Neurology

## 2018-11-17 ENCOUNTER — Encounter: Payer: Self-pay | Admitting: *Deleted

## 2018-11-17 DIAGNOSIS — I5023 Acute on chronic systolic (congestive) heart failure: Secondary | ICD-10-CM | POA: Diagnosis not present

## 2018-11-17 DIAGNOSIS — L97222 Non-pressure chronic ulcer of left calf with fat layer exposed: Secondary | ICD-10-CM | POA: Diagnosis not present

## 2018-11-17 NOTE — Patient Outreach (Signed)
[facility]) Care Management  11/17/2018  [name] 02/17/1926 [id number]   Notified via Care Management Assistant that member triggered red on EMMI Heart Failure Dashboard on 11-16-2018 due to 1. weight 111 and 2. member does not know names of medications.  Called member to discuss weight and member stated she wrote down her weight for today and it was 110 lbs. Spoke on phone to [employer] who stated that member's scale was off-balance by 5 lbs and when member initially stated weight of 120, this was incorrect. [name] stated that member was weighed at PCP office and weight was 114 lbs.   As previously noted in Initial Home Assessment, member's granddaughter [name] manages member's medications via placing meds in pill box for member. Member is unable to name her medications.   [name] requesting for EMMI Calls to routed to [facility]. Will request for Care Management Assistant to make changes as requested.   Will follow up with member within next 2 weeks as scheduled.   [name] "Alexa" [name], [name]  [facility] Care Management  Community Care Management Coordinator  [phone] [name]

## 2018-11-17 NOTE — Telephone Encounter (Signed)
Patient showed to apt with daughter at 2:26 pm. Patient was unable to be seen due to 26 min late. Pt daughter verbalized understanding and will call back to reschedule.

## 2018-11-19 ENCOUNTER — Encounter (HOSPITAL_COMMUNITY): Payer: Self-pay | Admitting: Emergency Medicine

## 2018-11-19 ENCOUNTER — Inpatient Hospital Stay (HOSPITAL_COMMUNITY)
Admission: EM | Admit: 2018-11-19 | Discharge: 2018-11-22 | DRG: 291 | Disposition: A | Discharge status: Home / Self Care | Payer: Medicare Other | Attending: Internal Medicine | Admitting: Internal Medicine

## 2018-11-19 ENCOUNTER — Other Ambulatory Visit: Payer: Self-pay

## 2018-11-19 ENCOUNTER — Emergency Department (HOSPITAL_COMMUNITY): Payer: Medicare Other

## 2018-11-19 DIAGNOSIS — E1122 Type 2 diabetes mellitus with diabetic chronic kidney disease: Secondary | ICD-10-CM | POA: Diagnosis present

## 2018-11-19 DIAGNOSIS — N179 Acute kidney failure, unspecified: Secondary | ICD-10-CM

## 2018-11-19 DIAGNOSIS — I2510 Atherosclerotic heart disease of native coronary artery without angina pectoris: Secondary | ICD-10-CM | POA: Diagnosis not present

## 2018-11-19 DIAGNOSIS — E875 Hyperkalemia: Secondary | ICD-10-CM | POA: Diagnosis not present

## 2018-11-19 DIAGNOSIS — I5043 Acute on chronic combined systolic (congestive) and diastolic (congestive) heart failure: Secondary | ICD-10-CM | POA: Diagnosis present

## 2018-11-19 DIAGNOSIS — N2889 Other specified disorders of kidney and ureter: Secondary | ICD-10-CM | POA: Diagnosis not present

## 2018-11-19 DIAGNOSIS — G629 Polyneuropathy, unspecified: Secondary | ICD-10-CM | POA: Diagnosis present

## 2018-11-19 DIAGNOSIS — I340 Nonrheumatic mitral (valve) insufficiency: Secondary | ICD-10-CM | POA: Diagnosis not present

## 2018-11-19 DIAGNOSIS — E119 Type 2 diabetes mellitus without complications: Secondary | ICD-10-CM

## 2018-11-19 DIAGNOSIS — Z9981 Dependence on supplemental oxygen: Secondary | ICD-10-CM | POA: Diagnosis not present

## 2018-11-19 DIAGNOSIS — T68XXXA Hypothermia, initial encounter: Secondary | ICD-10-CM | POA: Diagnosis not present

## 2018-11-19 DIAGNOSIS — I5042 Chronic combined systolic (congestive) and diastolic (congestive) heart failure: Secondary | ICD-10-CM | POA: Diagnosis not present

## 2018-11-19 DIAGNOSIS — J189 Pneumonia, unspecified organism: Secondary | ICD-10-CM

## 2018-11-19 DIAGNOSIS — N184 Chronic kidney disease, stage 4 (severe): Secondary | ICD-10-CM | POA: Diagnosis present

## 2018-11-19 DIAGNOSIS — A419 Sepsis, unspecified organism: Secondary | ICD-10-CM | POA: Diagnosis not present

## 2018-11-19 DIAGNOSIS — J9621 Acute and chronic respiratory failure with hypoxia: Secondary | ICD-10-CM | POA: Diagnosis not present

## 2018-11-19 DIAGNOSIS — Z8249 Family history of ischemic heart disease and other diseases of the circulatory system: Secondary | ICD-10-CM

## 2018-11-19 DIAGNOSIS — E1151 Type 2 diabetes mellitus with diabetic peripheral angiopathy without gangrene: Secondary | ICD-10-CM | POA: Diagnosis present

## 2018-11-19 DIAGNOSIS — N3289 Other specified disorders of bladder: Secondary | ICD-10-CM | POA: Diagnosis present

## 2018-11-19 DIAGNOSIS — E43 Unspecified severe protein-calorie malnutrition: Secondary | ICD-10-CM | POA: Diagnosis not present

## 2018-11-19 DIAGNOSIS — J90 Pleural effusion, not elsewhere classified: Secondary | ICD-10-CM | POA: Diagnosis not present

## 2018-11-19 DIAGNOSIS — Z79899 Other long term (current) drug therapy: Secondary | ICD-10-CM

## 2018-11-19 DIAGNOSIS — D638 Anemia in other chronic diseases classified elsewhere: Secondary | ICD-10-CM | POA: Diagnosis not present

## 2018-11-19 DIAGNOSIS — R410 Disorientation, unspecified: Secondary | ICD-10-CM | POA: Diagnosis not present

## 2018-11-19 DIAGNOSIS — I700 Atherosclerosis of aorta: Secondary | ICD-10-CM | POA: Diagnosis present

## 2018-11-19 DIAGNOSIS — M8580 Other specified disorders of bone density and structure, unspecified site: Secondary | ICD-10-CM | POA: Diagnosis not present

## 2018-11-19 DIAGNOSIS — T68XXXD Hypothermia, subsequent encounter: Secondary | ICD-10-CM | POA: Diagnosis not present

## 2018-11-19 DIAGNOSIS — Z7982 Long term (current) use of aspirin: Secondary | ICD-10-CM

## 2018-11-19 DIAGNOSIS — R0602 Shortness of breath: Secondary | ICD-10-CM

## 2018-11-19 DIAGNOSIS — E785 Hyperlipidemia, unspecified: Secondary | ICD-10-CM | POA: Diagnosis present

## 2018-11-19 DIAGNOSIS — I10 Essential (primary) hypertension: Secondary | ICD-10-CM | POA: Diagnosis present

## 2018-11-19 DIAGNOSIS — I130 Hypertensive heart and chronic kidney disease with heart failure and stage 1 through stage 4 chronic kidney disease, or unspecified chronic kidney disease: Principal | ICD-10-CM | POA: Diagnosis present

## 2018-11-19 DIAGNOSIS — R680 Hypothermia, not associated with low environmental temperature: Secondary | ICD-10-CM | POA: Diagnosis not present

## 2018-11-19 DIAGNOSIS — Z681 Body mass index (BMI) 19 or less, adult: Secondary | ICD-10-CM

## 2018-11-19 DIAGNOSIS — Z7984 Long term (current) use of oral hypoglycemic drugs: Secondary | ICD-10-CM

## 2018-11-19 DIAGNOSIS — I509 Heart failure, unspecified: Secondary | ICD-10-CM

## 2018-11-19 LAB — CBC WITH DIFFERENTIAL/PLATELET
Abs Immature Granulocytes: 0.02 10*3/uL (ref 0.00–0.07)
Basophils Absolute: 0 10*3/uL (ref 0.0–0.1)
Basophils Relative: 0 %
Eosinophils Absolute: 0.1 10*3/uL (ref 0.0–0.5)
Eosinophils Relative: 2 %
HCT: 33.7 % — ABNORMAL LOW (ref 36.0–46.0)
HEMOGLOBIN: 10.1 g/dL — AB (ref 12.0–15.0)
Immature Granulocytes: 0 %
Lymphocytes Relative: 27 %
Lymphs Abs: 1.3 10*3/uL (ref 0.7–4.0)
MCH: 23 pg — ABNORMAL LOW (ref 26.0–34.0)
MCHC: 30 g/dL (ref 30.0–36.0)
MCV: 76.6 fL — ABNORMAL LOW (ref 80.0–100.0)
Monocytes Absolute: 0.5 10*3/uL (ref 0.1–1.0)
Monocytes Relative: 10 %
Neutro Abs: 2.9 10*3/uL (ref 1.7–7.7)
Neutrophils Relative %: 61 %
Platelets: 167 10*3/uL (ref 150–400)
RBC: 4.4 MIL/uL (ref 3.87–5.11)
RDW: 19.6 % — ABNORMAL HIGH (ref 11.5–15.5)
WBC: 4.7 10*3/uL (ref 4.0–10.5)
nRBC: 0.4 % — ABNORMAL HIGH (ref 0.0–0.2)

## 2018-11-19 LAB — COMPREHENSIVE METABOLIC PANEL
ALT: 18 U/L (ref 0–44)
AST: 42 U/L — ABNORMAL HIGH (ref 15–41)
Albumin: 3.2 g/dL — ABNORMAL LOW (ref 3.5–5.0)
Alkaline Phosphatase: 60 U/L (ref 38–126)
Anion gap: 14 (ref 5–15)
BUN: 56 mg/dL — ABNORMAL HIGH (ref 8–23)
CHLORIDE: 106 mmol/L (ref 98–111)
CO2: 22 mmol/L (ref 22–32)
Calcium: 9.4 mg/dL (ref 8.9–10.3)
Creatinine, Ser: 3.87 mg/dL — ABNORMAL HIGH (ref 0.44–1.00)
GFR calc Af Amer: 11 mL/min — ABNORMAL LOW (ref >60)
GFR calc non Af Amer: 10 mL/min — ABNORMAL LOW (ref >60)
Glucose, Bld: 158 mg/dL — ABNORMAL HIGH (ref 70–99)
Potassium: 5.6 mmol/L — ABNORMAL HIGH (ref 3.5–5.1)
Sodium: 142 mmol/L (ref 135–145)
Total Bilirubin: 1 mg/dL (ref 0.3–1.2)
Total Protein: 8.7 g/dL — ABNORMAL HIGH (ref 6.5–8.1)

## 2018-11-19 LAB — URINALYSIS, ROUTINE W REFLEX MICROSCOPIC
Bilirubin Urine: NEGATIVE
Glucose, UA: NEGATIVE mg/dL
Hgb urine dipstick: NEGATIVE
Ketones, ur: NEGATIVE mg/dL
LEUKOCYTES UA: NEGATIVE
Nitrite: NEGATIVE
Protein, ur: NEGATIVE mg/dL
Specific Gravity, Urine: 1.02 (ref 1.005–1.030)
pH: 5.5 (ref 5.0–8.0)

## 2018-11-19 LAB — I-STAT CG4 LACTIC ACID, ED: Lactic Acid, Venous: 2.79 mmol/L (ref 0.5–1.9)

## 2018-11-19 LAB — I-STAT TROPONIN, ED: Troponin i, poc: 0.03 ng/mL (ref 0.00–0.08)

## 2018-11-19 LAB — BRAIN NATRIURETIC PEPTIDE: B Natriuretic Peptide: 1026.4 pg/mL — ABNORMAL HIGH (ref 0.0–100.0)

## 2018-11-19 LAB — CBG MONITORING, ED: Glucose-Capillary: 175 mg/dL — ABNORMAL HIGH (ref 70–99)

## 2018-11-19 MED ORDER — SODIUM CHLORIDE 0.9 % IV SOLN
500.0000 mg | Freq: Once | INTRAVENOUS | Status: AC
  Administered 2018-11-19: 500 mg via INTRAVENOUS
  Filled 2018-11-19: qty 500

## 2018-11-19 MED ORDER — SODIUM CHLORIDE 0.9 % IV SOLN
2.0000 g | Freq: Once | INTRAVENOUS | Status: AC
  Administered 2018-11-19: 2 g via INTRAVENOUS
  Filled 2018-11-19: qty 20

## 2018-11-19 NOTE — ED Provider Notes (Signed)
[facility] Emergency Department Provider Note MRN:  [id number]  Arrival date & time: 11/20/18     Chief Complaint   Foot pain History of Present Illness   [name] is a 84 y.o. year-old female with a history of CAD, CHF, PVD presenting to the ED with chief complaint of foot pain.  Patient endorsing bilateral foot burning earlier today, not currently.  Also endorsing shortness of breath.  Patient's sons at bedside states that patient is more confused than normal today.  I was unable to obtain an accurate HPI, PMH, or ROS due to the patient's altered mental status.  Review of Systems  Positive for altered mental status, foot pain, shortness of breath.  Patient's Health History    Past Medical History:  Diagnosis Date  . ACS (acute coronary syndrome) ([place]) 08/2018  . Anemia    family unsure of this history  . Arthritis   . CHF (congestive heart failure) ([place])   . Chronic ulcer of calf (HCC)    Left  . Constipation   . Diabetes mellitus without complication ([place])   . Dyslipidemia   . Dyspnea   . GERD (gastroesophageal reflux disease)   . Hyperlipidemia   . Hypertension   . Peripheral vascular disease [facility])     Past Surgical History:  Procedure Laterality Date  . ABDOMINAL AORTOGRAM W/LOWER EXTREMITY Left 12/27/2017   Procedure: ABDOMINAL AORTOGRAM W/LOWER EXTREMITY;  Surgeon: Algernon Huxley, MD;  Location: [place] CV [facility];  Service: Cardiovascular;  Laterality: Left;  . ABDOMINAL HYSTERECTOMY    . AMPUTATION TOE Left 12/23/2017   Procedure: AMPUTATION TOE LEFT GREAT TOE;  Surgeon: Sharlotte Alamo, DPM;  Location: [facility] ORS;  Service: Podiatry;  Laterality: Left;  . ENDARTERECTOMY FEMORAL Left 09/08/2017   Procedure: ENDARTERECTOMY FEMORAL WITH CORMATRIX PATCH;  Surgeon: Algernon Huxley, MD;  Location: [facility] ORS;  Service: Vascular;  Laterality: Left;  . EYE SURGERY Bilateral    Cataract Extraction with IOL  . LOWER EXTREMITY ANGIOGRAPHY Left  07/22/2017   Procedure: Lower Extremity Angiography;  Surgeon: Algernon Huxley, MD;  Location: [place] CV [facility];  Service: Cardiovascular;  Laterality: Left;  . LOWER EXTREMITY ANGIOGRAPHY Left 08/16/2017   Procedure: Lower Extremity Angiography;  Surgeon: Algernon Huxley, MD;  Location: [place] [facility];  Service: Cardiovascular;  Laterality: Left;  Marland Kitchen MULTIPLE TOOTH EXTRACTIONS      Family History  Problem Relation Age of Onset  . Hypertension Father     Social History   Socioeconomic History  . Marital status: Legally Separated    Spouse name: Not on file  . Number of children: Not on file  . Years of education: Not on file  . Highest education level: Not on file  Occupational History  . Not on file  Social Needs  . Financial resource strain: Not on file  . Food insecurity:    Worry: Not on file    Inability: Not on file  . Transportation needs:    Medical: Not on file    Non-medical: Not on file  Tobacco Use  . Smoking status: Never Smoker  . Smokeless tobacco: Never Used  Substance and Sexual Activity  . Alcohol use: No  . Drug use: No  . Sexual activity: Not Currently  Lifestyle  . Physical activity:    Days per week: Not on file    Minutes per session: Not on file  . Stress: Not on file  Relationships  . Social connections:  Talks on phone: Not on file    Gets together: Not on file    Attends religious service: Not on file    Active member of club or organization: Not on file    Attends meetings of clubs or organizations: Not on file    Relationship status: Not on file  . Intimate partner violence:    Fear of current or ex partner: Not on file    Emotionally abused: Not on file    Physically abused: Not on file    Forced sexual activity: Not on file  Other Topics Concern  . Not on file  Social History Narrative  . Not on file     Physical Exam  Vital Signs and Nursing Notes reviewed Vitals:   11/19/18 2300 11/19/18 2330  BP: 116/74 114/69    Pulse: 64 (!) 53  Resp: 13 16  Temp:    SpO2: 100% 99%    CONSTITUTIONAL: Chronically ill-appearing, NAD NEURO:  Alert and oriented x 3, no focal deficits EYES:  eyes equal and reactive ENT/NECK:  no LAD, no JVD CARDIO: Regular rate, normal S1 and S2, cold extremities PULM:  CTAB no wheezing or rhonchi GI/GU:  normal bowel sounds, non-distended, non-tender MSK/SPINE:  No gross deformities, no edema SKIN:  no rash, atraumatic PSYCH:  Appropriate speech and behavior  Diagnostic and Interventional Summary    EKG Interpretation  Date/Time:  Saturday November 19 2018 22:05:54 EST Ventricular Rate:  59 PR Interval:    QRS Duration: 113 QT Interval:  495 QTC Calculation: 491 R Axis:   36 Text Interpretation:  Sinus rhythm Consider anterior infarct Abnormal T, consider ischemia, lateral leads Baseline wander in lead(s) V2 V4 Confirmed by Gerlene Fee [phone]) on 11/19/2018 10:38:27 PM      Labs Reviewed  COMPREHENSIVE METABOLIC PANEL - Abnormal; Notable for the following components:      Result Value   Potassium 5.6 (*)    Glucose, Bld 158 (*)    BUN 56 (*)    Creatinine, Ser 3.87 (*)    Total Protein 8.7 (*)    Albumin 3.2 (*)    AST 42 (*)    GFR calc non Af Amer 10 (*)    GFR calc Af Amer 11 (*)    All other components within normal limits  CBC WITH DIFFERENTIAL/PLATELET - Abnormal; Notable for the following components:   Hemoglobin 10.1 (*)    HCT 33.7 (*)    MCV 76.6 (*)    MCH 23.0 (*)    RDW 19.6 (*)    nRBC 0.4 (*)    All other components within normal limits  BRAIN NATRIURETIC PEPTIDE - Abnormal; Notable for the following components:   B Natriuretic Peptide 1,026.4 (*)    All other components within normal limits  I-STAT CG4 LACTIC ACID, ED - Abnormal; Notable for the following components:   Lactic Acid, Venous 2.79 (*)    All other components within normal limits  CBG MONITORING, ED - Abnormal; Notable for the following components:   Glucose-Capillary  175 (*)    All other components within normal limits  CULTURE, BLOOD (ROUTINE X 2)  CULTURE, BLOOD (ROUTINE X 2)  URINALYSIS, ROUTINE W REFLEX MICROSCOPIC  I-STAT TROPONIN, ED  I-STAT CG4 LACTIC ACID, ED    DG Chest Port 1 View  Final Result      Medications  azithromycin (ZITHROMAX) 500 mg in sodium chloride 0.9 % 250 mL IVPB (500 mg Intravenous New Bag/Given 11/19/18 2340)  cefTRIAXone (ROCEPHIN) 2 g in sodium chloride 0.9 % 100 mL IVPB (0 g Intravenous Stopped 11/19/18 2329)     Procedures Critical Care Critical Care Documentation Critical care time provided by me (excluding procedures): 37 minutes  Condition necessitating critical care: Sepsis, hypothermia  Components of critical care management: reviewing of prior records, laboratory and imaging interpretation, frequent re-examination and reassessment of vital signs, administration of IV antibiotics, discussion with consulting services    ED Course and Medical Decision Making  I have reviewed the triage vital signs and the nursing notes.  Pertinent labs & imaging results that were available during my care of the patient were reviewed by me and considered in my medical decision making (see below for details).  Hypothermia in this 84 year old female complaining of burning feet today.  Vital signs otherwise stable, feet are cold but there is no rash, no signs of ischemia, normal cap refill.  Will confirm strong pulses with Doppler.  Temperature 93, on bear hugger, no exposure to the cold weather today per her sons.  Concern for sepsis, work-up pending.  Chest x-ray reveals interstitial edema versus pneumonia.  Empiric CAP coverage provided.  Will admit to hospitalist service.  Barth Kirks. [place] Small, Erin [email or web address]  Final Clinical Impressions(s) / ED Diagnoses     ICD-10-CM   1. Community acquired pneumonia, unspecified laterality J18.9   2. Hypothermia  T68.Daymon Larsen Chest [facility] [place] Chest [place] 1 View    ED Discharge Orders    None         Maudie Flakes, MD 11/20/18 0001

## 2018-11-19 NOTE — ED Notes (Signed)
No fluid bolus given per MD

## 2018-11-19 NOTE — ED Notes (Signed)
Unable to get temp at this moment

## 2018-11-19 NOTE — ED Notes (Signed)
Doppler at bedside.

## 2018-11-19 NOTE — ED Triage Notes (Signed)
Pt reports bilateral ankle burning, more on the left that started this morning. Denies injuries. Pt wears 2L [place] at home baseline. Hx of diabetes.

## 2018-11-19 NOTE — ED Notes (Signed)
ED Provider at bedside. 

## 2018-11-19 NOTE — ED Notes (Signed)
Bear hugger placed on pt and placed on high.

## 2018-11-20 ENCOUNTER — Observation Stay (HOSPITAL_COMMUNITY): Payer: Medicare Other

## 2018-11-20 DIAGNOSIS — I5042 Chronic combined systolic (congestive) and diastolic (congestive) heart failure: Secondary | ICD-10-CM

## 2018-11-20 DIAGNOSIS — N2889 Other specified disorders of kidney and ureter: Secondary | ICD-10-CM | POA: Diagnosis not present

## 2018-11-20 DIAGNOSIS — A419 Sepsis, unspecified organism: Secondary | ICD-10-CM | POA: Diagnosis present

## 2018-11-20 DIAGNOSIS — N179 Acute kidney failure, unspecified: Secondary | ICD-10-CM

## 2018-11-20 DIAGNOSIS — E875 Hyperkalemia: Secondary | ICD-10-CM

## 2018-11-20 DIAGNOSIS — E785 Hyperlipidemia, unspecified: Secondary | ICD-10-CM

## 2018-11-20 LAB — BASIC METABOLIC PANEL
Anion gap: 12 (ref 5–15)
BUN: 56 mg/dL — AB (ref 8–23)
CO2: 23 mmol/L (ref 22–32)
Calcium: 8.8 mg/dL — ABNORMAL LOW (ref 8.9–10.3)
Chloride: 108 mmol/L (ref 98–111)
Creatinine, Ser: 3.91 mg/dL — ABNORMAL HIGH (ref 0.44–1.00)
GFR calc Af Amer: 11 mL/min — ABNORMAL LOW (ref >60)
GFR calc non Af Amer: 10 mL/min — ABNORMAL LOW (ref >60)
Glucose, Bld: 120 mg/dL — ABNORMAL HIGH (ref 70–99)
Potassium: 4.5 mmol/L (ref 3.5–5.1)
Sodium: 143 mmol/L (ref 135–145)

## 2018-11-20 LAB — INFLUENZA PANEL BY PCR (TYPE A & B)
Influenza A By PCR: NEGATIVE
Influenza B By PCR: NEGATIVE

## 2018-11-20 LAB — TSH: TSH: 4.927 u[IU]/mL — ABNORMAL HIGH (ref 0.350–4.500)

## 2018-11-20 LAB — T4, FREE: FREE T4: 1.35 ng/dL (ref 0.82–1.77)

## 2018-11-20 LAB — CK: Total CK: 50 U/L (ref 38–234)

## 2018-11-20 LAB — MRSA PCR SCREENING: MRSA by PCR: NEGATIVE

## 2018-11-20 LAB — LACTIC ACID, PLASMA: Lactic Acid, Venous: 1.9 mmol/L (ref 0.5–1.9)

## 2018-11-20 LAB — PROCALCITONIN: Procalcitonin: <0.1 ng/mL

## 2018-11-20 LAB — CORTISOL: CORTISOL PLASMA: 24.7 ug/dL

## 2018-11-20 MED ORDER — CLOPIDOGREL BISULFATE 75 MG PO TABS
75.0000 mg | ORAL_TABLET | Freq: Every day | ORAL | Status: DC
  Administered 2018-11-20 – 2018-11-22 (×3): 75 mg via ORAL
  Filled 2018-11-20 (×3): qty 1

## 2018-11-20 MED ORDER — SODIUM CHLORIDE 0.9 % IV SOLN: 500.0000 mg | INTRAVENOUS | Status: DC

## 2018-11-20 MED ORDER — CALCIUM CARBONATE-VITAMIN D 500-200 MG-UNIT PO TABS
1.0000 | ORAL_TABLET | Freq: Every day | ORAL | Status: DC
  Administered 2018-11-20 – 2018-11-22 (×3): 1 via ORAL
  Filled 2018-11-20 (×3): qty 1

## 2018-11-20 MED ORDER — HEPARIN SODIUM (PORCINE) 5000 UNIT/ML IJ SOLN
5000.0000 [IU] | Freq: Three times a day (TID) | INTRAMUSCULAR | Status: DC
  Administered 2018-11-20 – 2018-11-22 (×4): 5000 [IU] via SUBCUTANEOUS
  Filled 2018-11-20 (×5): qty 1

## 2018-11-20 MED ORDER — ACETAMINOPHEN 325 MG PO TABS: 650.0000 mg | ORAL_TABLET | Freq: Four times a day (QID) | ORAL | Status: DC | PRN

## 2018-11-20 MED ORDER — FUROSEMIDE 10 MG/ML IJ SOLN: 40.0000 mg | Freq: Once | INTRAMUSCULAR | Status: DC

## 2018-11-20 MED ORDER — SODIUM CHLORIDE 0.9 % IV SOLN: INTRAVENOUS | Status: DC

## 2018-11-20 MED ORDER — PANTOPRAZOLE SODIUM 20 MG PO TBEC
20.0000 mg | DELAYED_RELEASE_TABLET | Freq: Every day | ORAL | Status: DC
  Administered 2018-11-20 – 2018-11-22 (×3): 20 mg via ORAL
  Filled 2018-11-20 (×3): qty 1

## 2018-11-20 MED ORDER — VITAMIN D 25 MCG (1000 UNIT) PO TABS
2000.0000 [IU] | ORAL_TABLET | Freq: Every day | ORAL | Status: DC
  Administered 2018-11-20 – 2018-11-22 (×3): 2000 [IU] via ORAL
  Filled 2018-11-20 (×4): qty 2

## 2018-11-20 MED ORDER — FUROSEMIDE 10 MG/ML IJ SOLN
60.0000 mg | Freq: Two times a day (BID) | INTRAMUSCULAR | Status: AC
  Administered 2018-11-20 (×2): 60 mg via INTRAVENOUS
  Filled 2018-11-20 (×2): qty 6

## 2018-11-20 MED ORDER — SODIUM CHLORIDE 0.9 % IV SOLN: 1.0000 g | INTRAVENOUS | Status: DC

## 2018-11-20 MED ORDER — POLYETHYLENE GLYCOL 3350 17 G PO PACK: 17.0000 g | PACK | Freq: Every day | ORAL | Status: DC | PRN

## 2018-11-20 MED ORDER — ASPIRIN 81 MG PO CHEW
81.0000 mg | CHEWABLE_TABLET | Freq: Every day | ORAL | Status: DC
  Administered 2018-11-20 – 2018-11-22 (×3): 81 mg via ORAL
  Filled 2018-11-20 (×3): qty 1

## 2018-11-20 MED ORDER — CARVEDILOL 3.125 MG PO TABS
3.1250 mg | ORAL_TABLET | Freq: Two times a day (BID) | ORAL | Status: DC
  Administered 2018-11-20 – 2018-11-22 (×4): 3.125 mg via ORAL
  Filled 2018-11-20 (×4): qty 1

## 2018-11-20 MED ORDER — SODIUM CHLORIDE 0.9 % IV BOLUS
500.0000 mL | Freq: Once | INTRAVENOUS | Status: AC
  Administered 2018-11-20: 500 mL via INTRAVENOUS

## 2018-11-20 MED ORDER — HYDROXYZINE HCL 10 MG PO TABS
10.0000 mg | ORAL_TABLET | Freq: Three times a day (TID) | ORAL | Status: DC | PRN
  Administered 2018-11-20 – 2018-11-22 (×5): 10 mg via ORAL
  Filled 2018-11-20 (×7): qty 1

## 2018-11-20 MED ORDER — ATORVASTATIN CALCIUM 10 MG PO TABS
10.0000 mg | ORAL_TABLET | Freq: Every day | ORAL | Status: DC
  Administered 2018-11-20 – 2018-11-22 (×3): 10 mg via ORAL
  Filled 2018-11-20 (×3): qty 1

## 2018-11-20 NOTE — [place] Notes (Signed)
History and Physical    [name] [name] DOB: 19-Feb-1926 DOA: 11/19/2018  PCP: Lucianne Lei, MD Patient coming from: Home  Chief Complaint: Shortness of breath  HPI: [name] is a 84 y.o. female with medical history significant of CAD, CHF, type 2 diabetes, hypertension, hyperlipidemia, peripheral vascular disease presenting to the hospital for evaluation of shortness of breath.  Patient was a poor historian and it was difficult to receive a thorough history from her.  Reports having shortness of breath, unclear for how long.  She uses 3 L home oxygen chronically at all times.  Denies having any fevers or chills.  Reports having minimal cough.  Unclear whether she is having fatigue.  Unclear whether she is having orthopnea or lower extremity edema.  Family at bedside does not believe patient has been exposed to any cold temperatures. Reports having decreased p.o. intake.  Denies having any nausea, vomiting, or abdominal pain.  Reports having chronic burning sensation in her feet from neuropathy.  Review of Systems: As per HPI otherwise 10 point review of systems negative.  Past Medical History:  Diagnosis Date  . ACS (acute coronary syndrome) ([place]) 08/2018  . Anemia    family unsure of this history  . Arthritis   . CHF (congestive heart failure) ([place])   . Chronic ulcer of calf (HCC)    Left  . Constipation   . Diabetes mellitus without complication ([place])   . Dyslipidemia   . Dyspnea   . GERD (gastroesophageal reflux disease)   . Hyperlipidemia   . Hypertension   . Peripheral vascular disease [facility])     Past Surgical History:  Procedure Laterality Date  . ABDOMINAL AORTOGRAM W/LOWER EXTREMITY Left 12/27/2017   Procedure: ABDOMINAL AORTOGRAM W/LOWER EXTREMITY;  Surgeon: Algernon Huxley, MD;  Location: [place] [facility];  Service: Cardiovascular;  Laterality: Left;  . ABDOMINAL HYSTERECTOMY    . AMPUTATION TOE Left 12/23/2017   Procedure: AMPUTATION TOE LEFT  GREAT TOE;  Surgeon: Sharlotte Alamo, DPM;  Location: [facility];  Service: Podiatry;  Laterality: Left;  . ENDARTERECTOMY FEMORAL Left 09/08/2017   Procedure: ENDARTERECTOMY FEMORAL WITH CORMATRIX PATCH;  Surgeon: Algernon Huxley, MD;  Location: [facility];  Service: Vascular;  Laterality: Left;  . EYE SURGERY Bilateral    Cataract Extraction with IOL  . LOWER EXTREMITY ANGIOGRAPHY Left 07/22/2017   Procedure: Lower Extremity Angiography;  Surgeon: Algernon Huxley, MD;  Location: [place] [facility];  Service: Cardiovascular;  Laterality: Left;  . LOWER EXTREMITY ANGIOGRAPHY Left 08/16/2017   Procedure: Lower Extremity Angiography;  Surgeon: Algernon Huxley, MD;  Location: [place] [facility];  Service: Cardiovascular;  Laterality: Left;  Marland Kitchen MULTIPLE TOOTH EXTRACTIONS       reports that she has never smoked. She has never used smokeless tobacco. She reports that she does not drink alcohol or use drugs.  No Known Allergies  Family History  Problem Relation Age of Onset  . Hypertension Father     Prior to Admission medications   Medication Sig Start Date End Date Taking? Authorizing Provider  acetaminophen (TYLENOL) 500 MG tablet Take 1 tablet (500 mg total) by mouth daily. 10/18/18  Yes Hongalgi, Lenis Dickinson, MD  aspirin 81 MG chewable tablet Chew 81 mg by mouth daily.   Yes [provider]  atorvastatin (LIPITOR) 10 MG tablet Take 1 tablet (10 mg total) by mouth daily. 09/22/18  Yes Hosie Poisson, MD  carvedilol (COREG) 12.5 MG tablet Take 1 tablet (  12.5 mg total) by mouth 2 (two) times daily with a meal. 09/13/18  Yes Emokpae, Courage, MD  Cholecalciferol (VITAMIN D3) 50 MCG (2000 UT) capsule Take 2,000 Units by mouth daily.   Yes [provider]  clopidogrel (PLAVIX) 75 MG tablet Take 1 tablet (75 mg total) by mouth daily with breakfast. 09/13/18  Yes Emokpae, Courage, MD  furosemide (LASIX) 40 MG tablet Take 1 tablet (40 mg total) by mouth 2 (two) times daily. 10/18/18 01/20/19 Yes  Hongalgi, Lenis Dickinson, MD  glipiZIDE (GLUCOTROL) 5 MG tablet Take 0.5 tablets (2.5 mg total) by mouth 2 (two) times daily before a meal. Always take with food 09/13/18  Yes Emokpae, Courage, MD  hydrOXYzine (ATARAX/VISTARIL) 10 MG tablet Take 1 tablet (10 mg total) by mouth 3 (three) times daily as needed for anxiety. Patient taking differently: Take 10 mg by mouth 3 (three) times daily.  09/22/18  Yes Hosie Poisson, MD  Multiple Minerals-Vitamins (CALCIUM-MAGNESIUM-ZINC-D3) TABS Take 1 tablet by mouth daily.   Yes [provider]  pantoprazole (PROTONIX) 20 MG tablet Take 1 tablet (20 mg total) by mouth daily. 07/13/14  Yes Kindl, Nelda Severe, MD  polyethylene glycol (MIRALAX / GLYCOLAX) packet Take 17 g by mouth daily as needed (for constipation.).    Yes [provider]  aspirin EC 81 MG tablet Take 1 tablet (81 mg total) by mouth daily. Patient not taking: Reported on 11/19/2018 10/18/18   Modena Jansky, MD  Blood Glucose Monitoring Suppl (ACCU-CHEK GUIDE) w/Device KIT See admin instructions. 12/28/17   [provider]    Physical Exam: Vitals:   11/20/18 0100 11/20/18 0145 11/20/18 0236 11/20/18 0300  BP: (!) 95/53 105/70  105/70  Pulse: 68 69  70  Resp: 18 (!) 23  19  Temp:   (!) 96.2 F (35.7 C) (!) 97.4 F (36.3 C)  TempSrc:   Rectal Oral  SpO2: 100% 100%  99%  Weight:      Height:        Physical Exam  Constitutional: She is oriented to person, place, and time. No distress.  Frail elderly female  HENT:  Head: Normocephalic.  Eyes: Right eye exhibits no discharge. Left eye exhibits no discharge.  Neck: Neck supple. No JVD present.  Cardiovascular: Normal rate, regular rhythm and intact distal pulses.  Pulmonary/Chest: Effort normal. No respiratory distress. She has no wheezes. She has rales.  Minimal rales appreciated at the right lung base Speaking clearly in full sentences No increased work of breathing  Abdominal: Soft. Bowel sounds are normal.  She exhibits no distension. There is no abdominal tenderness. There is no guarding.  Musculoskeletal:        General: No edema.  Neurological: She is alert and oriented to person, place, and time.  Skin: Skin is warm and dry. She is not diaphoretic.     Labs on Admission: I have personally reviewed following labs and imaging studies  CBC: Recent Labs  Lab 11/19/18 2218  WBC 4.7  NEUTROABS 2.9  HGB 10.1*  HCT 33.7*  MCV 76.6*  PLT 188   Basic Metabolic Panel: Recent Labs  Lab 11/19/18 2218 11/20/18 0313  NA 142 143  K 5.6* 4.5  CL 106 108  CO2 22 23  GLUCOSE 158* 120*  BUN 56* 56*  CREATININE 3.87* 3.91*  CALCIUM 9.4 8.8*   GFR: Estimated Creatinine Clearance: 8.2 mL/min (A) (by C-G formula based on SCr of 3.91 mg/dL (H)). Liver Function Tests: Recent Labs  Lab  11/19/18 2218  AST 42*  ALT 18  ALKPHOS 60  BILITOT 1.0  PROT 8.7*  ALBUMIN 3.2*   No results for input(s): LIPASE, AMYLASE in the last 168 hours. No results for input(s): AMMONIA in the last 168 hours. Coagulation Profile: No results for input(s): INR, PROTIME in the last 168 hours. Cardiac Enzymes: Recent Labs  Lab 11/20/18 0123  CKTOTAL 50   BNP (last 3 results) No results for input(s): PROBNP in the last 8760 hours. HbA1C: No results for input(s): HGBA1C in the last 72 hours. CBG: Recent Labs  Lab 11/19/18 2154  GLUCAP 175*   Lipid Profile: No results for input(s): CHOL, HDL, LDLCALC, TRIG, CHOLHDL, LDLDIRECT in the last 72 hours. Thyroid Function Tests: Recent Labs    11/20/18 0129 11/20/18 0313  TSH 4.927*  --   FREET4  --  1.35   Anemia Panel: No results for input(s): VITAMINB12, FOLATE, FERRITIN, TIBC, IRON, RETICCTPCT in the last 72 hours. Urine analysis:    Component Value Date/Time   COLORURINE YELLOW 11/19/2018 [place] 11/19/2018 2204   LABSPEC 1.020 11/19/2018 2204   PHURINE 5.5 11/19/2018 [place] 11/19/2018 [place] 11/19/2018 2204   BILIRUBINUR NEGATIVE 11/19/2018 2204   KETONESUR NEGATIVE 11/19/2018 2204   PROTEINUR NEGATIVE 11/19/2018 2204   UROBILINOGEN 1.0 09/07/2016 1403   NITRITE NEGATIVE 11/19/2018 [place] 11/19/2018 2204    Radiological Exams on Admission: US Renal  Result Date: 11/20/2018 CLINICAL DATA:  Acute onset of renal insufficiency. EXAM: RENAL / URINARY TRACT ULTRASOUND COMPLETE COMPARISON:  None. FINDINGS: Right Kidney: Renal measurements: 9.5 x 3.3 x 4.5 cm = volume: 73.1 mL. Increased parenchymal echogenicity is noted. No mass or hydronephrosis visualized. Left Kidney: Renal measurements: 8.1 x 3.3 x 3.7 cm = volume: 50.3 mL. Increased parenchymal echogenicity is noted. No mass or hydronephrosis visualized. Bladder: Appears normal for degree of bladder distention. IMPRESSION: 1. No evidence of hydronephrosis. 2. Increased renal parenchymal echogenicity raises concern for medical renal disease. Electronically Signed   By: Garald Balding M.D.   On: 11/20/2018 02:07   Dg Chest Port 1 View  Result Date: 11/19/2018 CLINICAL DATA:  Shortness of breath, leg pain.  History of CHF. EXAM: PORTABLE CHEST 1 VIEW COMPARISON:  Chest radiograph October 17, 2018 FINDINGS: Cardiac silhouette is mildly enlarged and unchanged. Calcified aortic arch. Diffuse interstitial prominence with RIGHT > LEFT bibasilar airspace opacities. Small pleural effusions. No pneumothorax. Osteopenia. IMPRESSION: 1. Interstitial prominence with increased bibasilar atelectasis, confluent edema versus pneumonia. Small pleural effusions. 2. Stable cardiomegaly. 3.  Aortic Atherosclerosis (ICD10-I70.0). Electronically Signed   By: Elon Alas M.D.   On: 11/19/2018 22:54    EKG: Independently reviewed.  Sinus rhythm (heart rate 59).  T wave inversions in V5 and V6, seen on previous EKG from October 16, 2018 as well.  Assessment/Plan Principal Problem:   Sepsis ([place]) Active Problems:    Hypertension   Dyslipidemia   CKD (chronic kidney disease) stage 4, GFR 15-29 ml/min (HCC)   CHF (congestive heart failure) (HCC)   Hypothermia   Diabetes (HCC)   AKI (acute kidney injury) ([place])   Hyperkalemia   Sepsis, unclear etiology Hypothermic on arrival with rectal temperature 93.2 F.  Bear hugger placed.  Temperature now improved to 97.4 F.  Blood pressure soft, now improved after a 500 cc IV fluid bolus.  Most recent blood pressure 105/70.  No leukocytosis.  Lactic acid 2.79, improved after  fluid bolus.  UA not suggestive of infection.  Chest x-ray with interstitial prominence with increased bibasilar atelectasis, confluent edema versus pneumonia.  -Received ceftriaxone and azithromycin.  Procalcitonin negative. Will hold additional antibiotics at this time.  -Gentle IV fluid hydration -Blood culture x2 pending -Urine culture -TSH mildly elevated at 4.927 which could explain hypothermia.  Check free T4 level.  CK and cortisol level normal and less likely to explain hypothermia.  Chronic systolic congestive heart failure -Echo done in October 2019 showing EF 25 to 30% and moderate to severe mitral regurgitation. -Chest x-ray with interstitial prominence with increased bibasilar atelectasis, confluent edema versus pneumonia.  Also showing small pleural effusions. BNP 1026 at present, was 2046 a month ago when patient was admitted for CHF exacerbation.  Patient does not appear overtly volume overloaded on exam-no JVD or lower extremity edema. -Currently on 2 L supplemental oxygen, uses 3 L at home. -Will avoid diuresis at this time in the setting of soft blood pressure.  Discussed with cardiology fellow Dr. Emilio Aspen - no additional recommendations at this time.  Mild hyperkalemia Potassium 5.6, previously elevated as well.  Unclear etiology.  Patient is not on a potassium sparing diuretic, ACE/ARB, or potassium supplement at home.  No acute EKG changes. -Repeat BMP in a.m.  AKI on  CKD 4 BUN 56.  Creatinine 3.9, recent baseline 2.9-3.0.  Renal ultrasound with increased parenchymal echogenicity concerning for medical renal disease; no hydronephrosis.   -Monitor urine output -Gentle IV fluid hydration -Avoid nephrotoxic agents/contrast -Repeat BMP in a.m.  Chronic anemia -Stable.  Hemoglobin 10.1, baseline approximately 9-10.  Type 2 diabetes Blood glucose 120. -Sliding scale insulin sensitive -CBG checks  Hypertension -Hold home medications in the setting of soft blood pressure readings.  Hyperlipidemia -Continue home Lipitor  DVT prophylaxis: Subcutaneous heparin Code Status: Patient wishes to be full code. Family Communication: Granddaughter at bedside. Disposition Plan: Anticipate discharge after clinical improvement. Admission status: Observation   Shela Leff MD [facility] Pager [phone]  If 7PM-7AM, please contact night-coverage [email or web address] Password [facility]  11/20/2018, 5:47 AM

## 2018-11-20 NOTE — Progress Notes (Signed)
Received pt from ED. Pt calm oriented to person and place, denies pain, 2 liters [place] rr unlabored, peripheral normal saline locks x2 intact. Pt granddaughter at bedside.

## 2018-11-20 NOTE — Progress Notes (Signed)
PROGRESS NOTE                                                                                                                                                                                                             Patient Demographics:    [name], is a 84 y.o. female, DOB - 1926/02/12, ZSW:[id number]  Admit date - 11/19/2018   Admitting Physician Shela Leff, MD  Outpatient Primary MD for the patient is Lucianne Lei, MD  LOS - 0  Chief Complaint  Patient presents with  . Code Sepsis       Brief Narrative  [name] is a 84 y.o. female with medical history significant of CAD, CHF on 3 L nasal cannula oxygen at home, type 2 diabetes, hypertension, hyperlipidemia, peripheral vascular disease, comes to the hospital with evaluation of shortness of breath and orthopnea.  Work-up consistent with CHF.   Subjective:    [name] today has, No headache, No chest pain, No abdominal pain - No Nausea, No new weakness tingling or numbness, No Cough - +ve SOB and orthopnea.   Assessment  & Plan :     1.  Acute on chronic hypoxic respiratory failure in a patient was on 3 L nasal cannula oxygen at home.  This is due to acute on chronic decompensation of diastolic and systolic heart failure.  EF around 30% on echocardiogram done few months ago.  She has been placed on IV Lasix, salt and fluid restriction, encouraged to sit up in chair, continue Coreg but at a lower dose than what she takes at home, blood pressure and renal function precludes the use of other medications like ACE/ARB or nitrates.  Monitor closely.  Do not think she has any infection or sepsis.  No further antibiotics.   2.  ARF on CKD 4.  Baseline creatinine around 3.  Renal ultrasound nonacute, diurese and monitor.  3.  Anemia of chronic disease.  Stable no acute issues.  4.  Hyperkalemia.  Resolved.  5.  Dyslipidemia.  On statin continue.    6. DM type II.  On sliding scale  insulin.  CBG (last 3)  Recent Labs    11/19/18 2154  GLUCAP 175*      Family Communication  : Granddaughter bedside  Code Status : Full  Disposition Plan  : To be decided  Consults  : None  Procedures  :    DVT Prophylaxis  :   Heparin   Lab Results  Component Value Date   PLT 167 11/19/2018    Diet :  Diet Order            Diet Heart Room service appropriate? Yes; Fluid consistency: Thin  Diet effective now               Inpatient Medications Scheduled Meds: . aspirin  81 mg Oral Daily  . atorvastatin  10 mg Oral Daily  . calcium-vitamin D  1 tablet Oral Q breakfast  . carvedilol  3.125 mg Oral BID WC  . cholecalciferol  2,000 Units Oral Daily  . clopidogrel  75 mg Oral Q breakfast  . furosemide  60 mg Intravenous BID  . heparin  5,000 Units Subcutaneous Q8H  . pantoprazole  20 mg Oral Daily   Continuous Infusions: PRN Meds:.acetaminophen, hydrOXYzine, polyethylene glycol  Antibiotics  :   Anti-infectives (From admission, onward)   Start     Dose/Rate Route Frequency Ordered Stop   11/20/18 2200  cefTRIAXone (ROCEPHIN) 1 g in sodium chloride 0.9 % 100 mL IVPB  Status:  Discontinued     1 g 200 mL/hr over 30 Minutes Intravenous Every 24 hours 11/20/18 0045 11/20/18 0517   11/20/18 2200  azithromycin (ZITHROMAX) 500 mg in sodium chloride 0.9 % 250 mL IVPB  Status:  Discontinued     500 mg 250 mL/hr over 60 Minutes Intravenous Every 24 hours 11/20/18 0045 11/20/18 0517   11/19/18 2330  azithromycin (ZITHROMAX) 500 mg in sodium chloride 0.9 % 250 mL IVPB     500 mg 250 mL/hr over 60 Minutes Intravenous  Once 11/19/18 2323 11/20/18 0053   11/19/18 2230  cefTRIAXone (ROCEPHIN) 2 g in sodium chloride 0.9 % 100 mL IVPB     2 g 200 mL/hr over 30 Minutes Intravenous  Once 11/19/18 2225 11/19/18 2329          Objective:   Vitals:   11/20/18 0100 11/20/18 0145 11/20/18 0236 11/20/18 0300  BP: (!) 95/53 105/70  105/70  Pulse: 68 69  70  Resp: 18 (!)  23  19  Temp:   (!) 96.2 F (35.7 C) (!) 97.4 F (36.3 C)  TempSrc:   Rectal Oral  SpO2: 100% 100%  99%  Weight:      Height:        Wt Readings from Last 3 Encounters:  11/19/18 54.4 kg  11/07/18 54.4 kg  11/02/18 51.9 kg     Intake/Output Summary (Last 24 hours) at 11/20/2018 1207 Last data filed at 11/20/2018 0930 Gross per 24 hour  Intake 620 ml  Output -  Net 620 ml     Physical Exam  Awake Alert, Oriented X 3, No new F.N deficits, Normal affect [name],PERRAL Supple Neck,No JVD, No cervical lymphadenopathy appriciated.  Symmetrical Chest wall movement, Good air movement bilaterally, CTAB RRR,No Gallops,Rubs or new Murmurs, No Parasternal Heave +ve B.Sounds, Abd Soft, No tenderness, No organomegaly appriciated, No rebound - guarding or rigidity. No Cyanosis, Clubbing or edema, No new Rash or bruise       Data Review:    CBC Recent Labs  Lab 11/19/18 2218  WBC 4.7  HGB 10.1*  HCT 33.7*  PLT 167  MCV 76.6*  MCH 23.0*  MCHC 30.0  RDW 19.6*  LYMPHSABS 1.3  MONOABS 0.5  EOSABS 0.1  BASOSABS 0.0    Chemistries  Recent Labs  Lab 11/19/18 2218 11/20/18 0313  NA 142 143  K 5.6* 4.5  CL 106 108  CO2  22 23  GLUCOSE 158* 120*  BUN 56* 56*  CREATININE 3.87* 3.91*  CALCIUM 9.4 8.8*  AST 42*  --   ALT 18  --   ALKPHOS 60  --   BILITOT 1.0  --    ------------------------------------------------------------------------------------------------------------------ No results for input(s): CHOL, HDL, LDLCALC, TRIG, CHOLHDL, LDLDIRECT in the last 72 hours.  Lab Results  Component Value Date   HGBA1C 5.8 (H) 09/21/2018   ------------------------------------------------------------------------------------------------------------------ Recent Labs    11/20/18 0129  TSH 4.927*   ------------------------------------------------------------------------------------------------------------------ No results for input(s): VITAMINB12, FOLATE, FERRITIN, TIBC,  IRON, RETICCTPCT in the last 72 hours.  Coagulation profile No results for input(s): INR, PROTIME in the last 168 hours.  No results for input(s): DDIMER in the last 72 hours.  Cardiac Enzymes No results for input(s): CKMB, TROPONINI, MYOGLOBIN in the last 168 hours.  Invalid input(s): CK ------------------------------------------------------------------------------------------------------------------    Component Value Date/Time   BNP 1,026.4 (H) 11/19/2018 2218    Micro Results Recent Results (from the past 240 hour(s))  Blood Culture (routine x 2)     Status: None (Preliminary result)   Collection Time: 11/19/18 10:00 PM  Result Value Ref Range Status   Specimen Description BLOOD RIGHT FOREARM  Final   Special Requests   Final    BOTTLES DRAWN AEROBIC AND ANAEROBIC Blood Culture adequate volume   Culture   Final    NO GROWTH < 12 HOURS Performed at [facility], [address], [place], [place] [address]    Report Status PENDING  Incomplete  Blood Culture (routine x 2)     Status: None (Preliminary result)   Collection Time: 11/19/18 10:15 PM  Result Value Ref Range Status   Specimen Description BLOOD LEFT ANTECUBITAL  Final   Special Requests   Final    BOTTLES DRAWN AEROBIC AND ANAEROBIC Blood Culture adequate volume   Culture   Final    NO GROWTH < 12 HOURS Performed at [facility], [place] [address] [place], [place] [address]    Report Status PENDING  Incomplete    Radiology Reports US Renal  Result Date: 11/20/2018 CLINICAL DATA:  Acute onset of renal insufficiency. EXAM: RENAL / URINARY TRACT ULTRASOUND COMPLETE COMPARISON:  None. FINDINGS: Right Kidney: Renal measurements: 9.5 x 3.3 x 4.5 cm = volume: 73.1 mL. Increased parenchymal echogenicity is noted. No mass or hydronephrosis visualized. Left Kidney: Renal measurements: 8.1 x 3.3 x 3.7 cm = volume: 50.3 mL. Increased parenchymal echogenicity is noted. No mass or hydronephrosis visualized.  Bladder: Appears normal for degree of bladder distention. IMPRESSION: 1. No evidence of hydronephrosis. 2. Increased renal parenchymal echogenicity raises concern for medical renal disease. Electronically Signed   By: Garald Balding M.D.   On: 11/20/2018 02:07   Dg Chest Port 1 View  Result Date: 11/19/2018 CLINICAL DATA:  Shortness of breath, leg pain.  History of CHF. EXAM: PORTABLE CHEST 1 VIEW COMPARISON:  Chest radiograph October 17, 2018 FINDINGS: Cardiac silhouette is mildly enlarged and unchanged. Calcified aortic arch. Diffuse interstitial prominence with RIGHT > LEFT bibasilar airspace opacities. Small pleural effusions. No pneumothorax. Osteopenia. IMPRESSION: 1. Interstitial prominence with increased bibasilar atelectasis, confluent edema versus pneumonia. Small pleural effusions. 2. Stable cardiomegaly. 3.  Aortic Atherosclerosis (ICD10-I70.0). Electronically Signed   By: Elon Alas M.D.   On: 11/19/2018 22:54      Time Spent in minutes  30   Lala Lund M.D on 11/20/2018 at 12:07 PM  To page go to [email or web address] -  password [facility]

## 2018-11-20 NOTE — [place] Notes (Signed)
Patient's temp taken rectally found to be 93.5 F Patient placed on bear hugger. Doctor notified. Orders to increase room temp and apply blankets recheck temp in about an hour.

## 2018-11-21 ENCOUNTER — Observation Stay (HOSPITAL_COMMUNITY): Payer: Medicare Other

## 2018-11-21 ENCOUNTER — Other Ambulatory Visit: Payer: Self-pay

## 2018-11-21 ENCOUNTER — Ambulatory Visit: Payer: Self-pay

## 2018-11-21 DIAGNOSIS — Z7984 Long term (current) use of oral hypoglycemic drugs: Secondary | ICD-10-CM | POA: Diagnosis not present

## 2018-11-21 DIAGNOSIS — I10 Essential (primary) hypertension: Secondary | ICD-10-CM

## 2018-11-21 DIAGNOSIS — E43 Unspecified severe protein-calorie malnutrition: Secondary | ICD-10-CM | POA: Diagnosis present

## 2018-11-21 DIAGNOSIS — I5043 Acute on chronic combined systolic (congestive) and diastolic (congestive) heart failure: Secondary | ICD-10-CM | POA: Diagnosis not present

## 2018-11-21 DIAGNOSIS — I340 Nonrheumatic mitral (valve) insufficiency: Secondary | ICD-10-CM | POA: Diagnosis present

## 2018-11-21 DIAGNOSIS — I130 Hypertensive heart and chronic kidney disease with heart failure and stage 1 through stage 4 chronic kidney disease, or unspecified chronic kidney disease: Secondary | ICD-10-CM | POA: Diagnosis present

## 2018-11-21 DIAGNOSIS — D638 Anemia in other chronic diseases classified elsewhere: Secondary | ICD-10-CM | POA: Diagnosis present

## 2018-11-21 DIAGNOSIS — N184 Chronic kidney disease, stage 4 (severe): Secondary | ICD-10-CM | POA: Diagnosis present

## 2018-11-21 DIAGNOSIS — E1122 Type 2 diabetes mellitus with diabetic chronic kidney disease: Secondary | ICD-10-CM

## 2018-11-21 DIAGNOSIS — E785 Hyperlipidemia, unspecified: Secondary | ICD-10-CM | POA: Diagnosis present

## 2018-11-21 DIAGNOSIS — J9621 Acute and chronic respiratory failure with hypoxia: Secondary | ICD-10-CM | POA: Diagnosis present

## 2018-11-21 DIAGNOSIS — N179 Acute kidney failure, unspecified: Secondary | ICD-10-CM | POA: Diagnosis not present

## 2018-11-21 DIAGNOSIS — Z7982 Long term (current) use of aspirin: Secondary | ICD-10-CM | POA: Diagnosis not present

## 2018-11-21 DIAGNOSIS — E875 Hyperkalemia: Secondary | ICD-10-CM | POA: Diagnosis present

## 2018-11-21 DIAGNOSIS — A419 Sepsis, unspecified organism: Secondary | ICD-10-CM | POA: Diagnosis present

## 2018-11-21 DIAGNOSIS — I700 Atherosclerosis of aorta: Secondary | ICD-10-CM | POA: Diagnosis present

## 2018-11-21 DIAGNOSIS — R410 Disorientation, unspecified: Secondary | ICD-10-CM | POA: Diagnosis not present

## 2018-11-21 DIAGNOSIS — Z9981 Dependence on supplemental oxygen: Secondary | ICD-10-CM | POA: Diagnosis not present

## 2018-11-21 DIAGNOSIS — M8580 Other specified disorders of bone density and structure, unspecified site: Secondary | ICD-10-CM | POA: Diagnosis present

## 2018-11-21 DIAGNOSIS — J90 Pleural effusion, not elsewhere classified: Secondary | ICD-10-CM | POA: Diagnosis not present

## 2018-11-21 DIAGNOSIS — R680 Hypothermia, not associated with low environmental temperature: Secondary | ICD-10-CM | POA: Diagnosis present

## 2018-11-21 DIAGNOSIS — I2510 Atherosclerotic heart disease of native coronary artery without angina pectoris: Secondary | ICD-10-CM | POA: Diagnosis present

## 2018-11-21 DIAGNOSIS — Z8249 Family history of ischemic heart disease and other diseases of the circulatory system: Secondary | ICD-10-CM | POA: Diagnosis not present

## 2018-11-21 DIAGNOSIS — Z681 Body mass index (BMI) 19 or less, adult: Secondary | ICD-10-CM | POA: Diagnosis not present

## 2018-11-21 DIAGNOSIS — G629 Polyneuropathy, unspecified: Secondary | ICD-10-CM | POA: Diagnosis present

## 2018-11-21 DIAGNOSIS — N3289 Other specified disorders of bladder: Secondary | ICD-10-CM | POA: Diagnosis present

## 2018-11-21 DIAGNOSIS — E1151 Type 2 diabetes mellitus with diabetic peripheral angiopathy without gangrene: Secondary | ICD-10-CM | POA: Diagnosis present

## 2018-11-21 DIAGNOSIS — T68XXXD Hypothermia, subsequent encounter: Secondary | ICD-10-CM

## 2018-11-21 LAB — BRAIN NATRIURETIC PEPTIDE: B NATRIURETIC PEPTIDE 5: 1566.9 pg/mL — AB (ref 0.0–100.0)

## 2018-11-21 LAB — BASIC METABOLIC PANEL
Anion gap: 13 (ref 5–15)
BUN: 55 mg/dL — ABNORMAL HIGH (ref 8–23)
CO2: 24 mmol/L (ref 22–32)
Calcium: 9.1 mg/dL (ref 8.9–10.3)
Chloride: 107 mmol/L (ref 98–111)
Creatinine, Ser: 3.92 mg/dL — ABNORMAL HIGH (ref 0.44–1.00)
GFR calc Af Amer: 11 mL/min — ABNORMAL LOW (ref >60)
GFR calc non Af Amer: 10 mL/min — ABNORMAL LOW (ref >60)
Glucose, Bld: 114 mg/dL — ABNORMAL HIGH (ref 70–99)
Potassium: 4.5 mmol/L (ref 3.5–5.1)
Sodium: 144 mmol/L (ref 135–145)

## 2018-11-21 LAB — CORTISOL: Cortisol, Plasma: 26.1 ug/dL

## 2018-11-21 LAB — MAGNESIUM: Magnesium: 2.9 mg/dL — ABNORMAL HIGH (ref 1.7–2.4)

## 2018-11-21 MED ORDER — FUROSEMIDE 10 MG/ML IJ SOLN: 40.0000 mg | Freq: Two times a day (BID) | INTRAMUSCULAR | Status: DC

## 2018-11-21 MED ORDER — METOLAZONE 2.5 MG PO TABS
2.5000 mg | ORAL_TABLET | Freq: Once | ORAL | Status: DC
  Filled 2018-11-21: qty 1

## 2018-11-21 MED ORDER — FUROSEMIDE 10 MG/ML IJ SOLN
60.0000 mg | Freq: Two times a day (BID) | INTRAMUSCULAR | Status: DC
  Administered 2018-11-21 – 2018-11-22 (×3): 60 mg via INTRAVENOUS
  Filled 2018-11-21 (×3): qty 6

## 2018-11-21 MED ORDER — ENSURE ENLIVE PO LIQD
237.0000 mL | Freq: Three times a day (TID) | ORAL | Status: DC
  Administered 2018-11-21 – 2018-11-22 (×2): 237 mL via ORAL

## 2018-11-21 NOTE — Patient Outreach (Signed)
[facility]) Care Management  11/21/2018  [name] 01-Nov-1926 [id number]   Member visited Ed on 11-19-2018 due to c/o foot pain and SOB, placed under OBS status; however, admitted today 11-21-2018 for r/o Sepsis. Hospital Liaisons made aware.   Will follow up when discharged to community.  [name], RN-BSN  [facility] Care Management  Community Care Management Coordinator  [phone] [name]

## 2018-11-21 NOTE — Progress Notes (Signed)
PROGRESS NOTE                                                                                                                                                                                                             Patient Demographics:    [name], is a 84 y.o. female, DOB - 28-Jul-1926, JYN:[id number]  Admit date - 11/19/2018   Admitting Physician Shela Leff, MD  Outpatient Primary MD for the patient is Lucianne Lei, MD  LOS - 0  Chief Complaint  Patient presents with  . Code Sepsis       Brief Narrative  [name] is a 84 y.o. female with medical history significant of CAD, CHF on 3 L nasal cannula oxygen at home, type 2 diabetes, hypertension, hyperlipidemia, peripheral vascular disease, comes to the hospital with evaluation of shortness of breath and orthopnea.  Work-up consistent with CHF. Has also developed delirium and hypothermia.   Subjective:   Patient in bed, appears comfortable, denies any headache, no fever, no chest pain or pressure, +ve shortness of breath , no abdominal pain. No focal weakness.   Assessment  & Plan :     1.  Acute on chronic hypoxic respiratory failure in a patient was on 3 L nasal cannula oxygen at home.  This is due to acute on chronic decompensation of combined diastolic and systolic heart failure.  EF around 30% on echocardiogram done few months ago.  She has been placed on IV Lasix which will be continued, continue salt and fluid striction, still has quite elevated BNP and chest x-ray shows worsening fluid accumulation/pulmonary edema hence will add 1 dose of Zaroxolyn on 11/21/2018, continue to monitor electrolytes, oxygen demand and clinically.  Coreg continued.  Renal insufficiency borderline low blood pressures preclude the addition of ACE/ARB/hydralazine/nitrates.  2.  ARF on CKD 4.  Baseline creatinine around 3.  Renal ultrasound nonacute, diurese and monitor.  3.  Anemia of chronic disease.  Stable no  acute issues.  4.  Hyperkalemia.  Resolved.  5.  Dyslipidemia.  On statin continue.    6.  Delirium.  Requiring a sitter on 11/20/2018 night.  Continue sitter for now, minimize narcotics and benzodiazepines, PRN Haldol.  Fall precautions and aspiration precautions.    7.  Hypothermia.  Likely due to exposure to elements.  Also has literally no body fat.  Increase room temperature, bear hugger, TSH stable will check random cortisol.  Supportive care for now.    8. DM type  II.  On sliding scale insulin.  CBG (last 3)  Recent Labs    11/19/18 2154  GLUCAP 175*      Family Communication  : Granddaughter bedside on 11/20/18  Code Status : Full  Disposition Plan  : To be decided  Consults  : None  Procedures  :    DVT Prophylaxis  :   Heparin   Lab Results  Component Value Date   PLT 167 11/19/2018    Diet :  Diet Order            Diet Heart Room service appropriate? Yes; Fluid consistency: Thin  Diet effective now              Inpatient Medications Scheduled Meds: . aspirin  81 mg Oral Daily  . atorvastatin  10 mg Oral Daily  . calcium-vitamin D  1 tablet Oral Q breakfast  . carvedilol  3.125 mg Oral BID WC  . cholecalciferol  2,000 Units Oral Daily  . clopidogrel  75 mg Oral Q breakfast  . furosemide  60 mg Intravenous BID  . heparin  5,000 Units Subcutaneous Q8H  . metolazone  2.5 mg Oral Once  . pantoprazole  20 mg Oral Daily   Continuous Infusions: PRN Meds:.acetaminophen, hydrOXYzine, polyethylene glycol  Antibiotics  :   Anti-infectives (From admission, onward)   Start     Dose/Rate Route Frequency Ordered Stop   11/20/18 2200  cefTRIAXone (ROCEPHIN) 1 g in sodium chloride 0.9 % 100 mL IVPB  Status:  Discontinued     1 g 200 mL/hr over 30 Minutes Intravenous Every 24 hours 11/20/18 0045 11/20/18 0517   11/20/18 2200  azithromycin (ZITHROMAX) 500 mg in sodium chloride 0.9 % 250 mL IVPB  Status:  Discontinued     500 mg 250 mL/hr over 60 Minutes  Intravenous Every 24 hours 11/20/18 0045 11/20/18 0517   11/19/18 2330  azithromycin (ZITHROMAX) 500 mg in sodium chloride 0.9 % 250 mL IVPB     500 mg 250 mL/hr over 60 Minutes Intravenous  Once 11/19/18 2323 11/20/18 0053   11/19/18 2230  cefTRIAXone (ROCEPHIN) 2 g in sodium chloride 0.9 % 100 mL IVPB     2 g 200 mL/hr over 30 Minutes Intravenous  Once 11/19/18 2225 11/19/18 2329          Objective:   Vitals:   11/21/18 0006 11/21/18 0230 11/21/18 0417 11/21/18 0646  BP:      Pulse:      Resp:      Temp: (!) 97.3 F (36.3 C) (!) 96.8 F (36 C) (!) 96.8 F (36 C) (!) 97.2 F (36.2 C)  TempSrc: Oral Rectal Rectal Oral  SpO2:      Weight:      Height:        Wt Readings from Last 3 Encounters:  11/19/18 54.4 kg  11/07/18 54.4 kg  11/02/18 51.9 kg     Intake/Output Summary (Last 24 hours) at 11/21/2018 0855 Last data filed at 11/20/2018 1300 Gross per 24 hour  Intake 138 ml  Output -  Net 138 ml     Physical Exam  Awake , but confused, No new F.N deficits,   [place].AT,PERRAL Supple Neck,No JVD, No cervical lymphadenopathy appriciated.  Symmetrical Chest wall movement, Good air movement bilaterally, +ve rales RRR,No Gallops, Rubs or new Murmurs, No Parasternal Heave +ve B.Sounds, Abd Soft, No tenderness, No organomegaly appriciated, No rebound - guarding or rigidity. No Cyanosis, Clubbing or edema, No  new Rash or bruise    Data Review:    CBC Recent Labs  Lab 11/19/18 2218  WBC 4.7  HGB 10.1*  HCT 33.7*  PLT 167  MCV 76.6*  MCH 23.0*  MCHC 30.0  RDW 19.6*  LYMPHSABS 1.3  MONOABS 0.5  EOSABS 0.1  BASOSABS 0.0    Chemistries  Recent Labs  Lab 11/19/18 2218 11/20/18 0313 11/21/18 0335  NA 142 143 144  K 5.6* 4.5 4.5  CL 106 108 107  CO2 22 23 24   GLUCOSE 158* 120* 114*  BUN 56* 56* 55*  CREATININE 3.87* 3.91* 3.92*  CALCIUM 9.4 8.8* 9.1  MG  --   --  2.9*  AST 42*  --   --   ALT 18  --   --   ALKPHOS 60  --   --   BILITOT 1.0  --    --    ------------------------------------------------------------------------------------------------------------------ No results for input(s): CHOL, HDL, LDLCALC, TRIG, CHOLHDL, LDLDIRECT in the last 72 hours.  Lab Results  Component Value Date   HGBA1C 5.8 (H) 09/21/2018   ------------------------------------------------------------------------------------------------------------------ Recent Labs    11/20/18 0129  TSH 4.927*   ------------------------------------------------------------------------------------------------------------------ No results for input(s): VITAMINB12, FOLATE, FERRITIN, TIBC, IRON, RETICCTPCT in the last 72 hours.  Coagulation profile No results for input(s): INR, PROTIME in the last 168 hours.  No results for input(s): DDIMER in the last 72 hours.  Cardiac Enzymes No results for input(s): CKMB, TROPONINI, MYOGLOBIN in the last 168 hours.  Invalid input(s): CK ------------------------------------------------------------------------------------------------------------------    Component Value Date/Time   BNP 1,566.9 (H) 11/21/2018 0654    Micro Results Recent Results (from the past 240 hour(s))  Blood Culture (routine x 2)     Status: None (Preliminary result)   Collection Time: 11/19/18 10:00 PM  Result Value Ref Range Status   Specimen Description BLOOD RIGHT FOREARM  Final   Special Requests   Final    BOTTLES DRAWN AEROBIC AND ANAEROBIC Blood Culture adequate volume   Culture   Final    NO GROWTH < 12 HOURS Performed at [facility], [address], [place], [place] [address]    Report Status PENDING  Incomplete  Blood Culture (routine x 2)     Status: None (Preliminary result)   Collection Time: 11/19/18 10:15 PM  Result Value Ref Range Status   Specimen Description BLOOD LEFT ANTECUBITAL  Final   Special Requests   Final    BOTTLES DRAWN AEROBIC AND ANAEROBIC Blood Culture adequate volume   Culture   Final    NO GROWTH < 12  HOURS Performed at [facility], [place] [address], [place], [place] [address]    Report Status PENDING  Incomplete  MRSA PCR Screening     Status: None   Collection Time: 11/20/18  3:28 PM  Result Value Ref Range Status   MRSA by PCR NEGATIVE NEGATIVE Final    Comment:        The GeneXpert MRSA Assay (FDA approved for NASAL specimens only), is one component of a comprehensive MRSA colonization surveillance program. It is not intended to diagnose MRSA infection nor to guide or monitor treatment for MRSA infections. Performed at [facility], [place] [address], [place], [place] [address]     Radiology Reports  US Renal  Result Date: 11/20/2018 CLINICAL DATA:  Acute onset of renal insufficiency. EXAM: RENAL / URINARY TRACT ULTRASOUND COMPLETE COMPARISON:  None. FINDINGS: Right Kidney: Renal measurements: 9.5 x  3.3 x 4.5 cm = volume: 73.1 mL. Increased parenchymal echogenicity is noted. No mass or hydronephrosis visualized. Left Kidney: Renal measurements: 8.1 x 3.3 x 3.7 cm = volume: 50.3 mL. Increased parenchymal echogenicity is noted. No mass or hydronephrosis visualized. Bladder: Appears normal for degree of bladder distention. IMPRESSION: 1. No evidence of hydronephrosis. 2. Increased renal parenchymal echogenicity raises concern for medical renal disease. Electronically Signed   By: Garald Balding M.D.   On: 11/20/2018 02:07   Dg Chest Port 1 View  Result Date: 11/19/2018 CLINICAL DATA:  Shortness of breath, leg pain.  History of CHF. EXAM: PORTABLE CHEST 1 VIEW COMPARISON:  Chest radiograph October 17, 2018 FINDINGS: Cardiac silhouette is mildly enlarged and unchanged. Calcified aortic arch. Diffuse interstitial prominence with RIGHT > LEFT bibasilar airspace opacities. Small pleural effusions. No pneumothorax. Osteopenia. IMPRESSION: 1. Interstitial prominence with increased bibasilar atelectasis, confluent edema versus pneumonia. Small pleural effusions. 2. Stable  cardiomegaly. 3.  Aortic Atherosclerosis (ICD10-I70.0). Electronically Signed   By: Elon Alas M.D.   On: 11/19/2018 22:54      Time Spent in minutes  30   Lala Lund M.D on 11/21/2018 at 8:55 AM  To page go to [email or web address] - password [facility]

## 2018-11-21 NOTE — Progress Notes (Signed)
Pt found in bed with bear hugger beside her. Pt stated she removed it because it was too warm. Pt educated and bear-hugger replaced. Will continue to monitor.

## 2018-11-21 NOTE — Progress Notes (Signed)
Pt becoming agitated and attempting to get out of bed. Writer called family to have them come in and sit with her. Pt restrained with a soft belt. Smita RN at bedside. VS stable, continuing to monitor.

## 2018-11-21 NOTE — Progress Notes (Signed)
Pt refusing catheter insertion at this time. Awaiting arrival of granddaughter to discuss plan of care. Charge notified. Continuing to monitor pt output.

## 2018-11-21 NOTE — Progress Notes (Signed)
Pt temp dropped to 96.4. Pt refused bear hugger. Pt agreed to use warm blankets. Placed warmed blankets on pt. Pt in no apparent distress. Will continue to monitor.

## 2018-11-21 NOTE — Progress Notes (Signed)
Initial Nutrition Assessment  DOCUMENTATION CODES:   Severe malnutrition in context of chronic illness  INTERVENTION:   Liberalize diet to regular  Ensure Enlive po TID, each supplement provides 350 kcal and 20 grams of protein  NUTRITION DIAGNOSIS:   Severe Malnutrition related to chronic illness(T2DM and CHF) as evidenced by severe fat depletion, severe muscle depletion, percent weight loss (11.3% within 3 months).  GOAL:   Patient will meet greater than or equal to 90% of their needs  MONITOR:   PO intake, Supplement acceptance, Diet advancement, Labs, I & O's  REASON FOR ASSESSMENT:   Malnutrition Screening Tool    ASSESSMENT:  84 y.o female with PMH of HTN, T2DM, CHF, CAD, AKI and on nasal cannula at home. Admitted with SOB, orthopnea, delerium,and hypothermia.    Pt was somewhat alert when DI entered the room. Pt seemed very confused and was soft spoken. Pt's son was at bedside.  Pt reported eating an egg and bacon for breakfast but was not going to eat lunch yet which was in the room. Pt's son helped with dietary recall and states she has an okay diet at home. Pt usually eats 1-2 eggs, 1 piece of bacon and some coffee for breakfast. Pt usually skips lunch since she wakes up later and eats breakfast later. For dinner pt eats bites and nibbles. Pt reported eating an apple or having an Ensure during the day.   Son reports Pt's UBW 3-4 days ago was 115#. Per the chart, weights for 12/9 and 12/21 seem to be 120# reported weights. Prior weights seemed to be trending down. 9/26 129.5#, 10/24 124.5#, 11/22 114.8# and 12/4 114.8#. This is a significant 11.3% (14.83#) weight loss in ~ 3 months. Pts physical exam showed severe muscle and fat depletions.   Pt reported liking a strawberry ensure that the nurse had given her.   Medications reviewed and include: Oscal with Vitamin D Cholecalciferol  Plavix Lasix Protonix Metolazone  Labs reviewed:  BUN 55 (H)  Creatinine  3.92 (H)  Magnesium 2.9 (H) CBG 175 (H)  HgA1C: 5.8 10/23    NUTRITION - FOCUSED PHYSICAL EXAM:    Most Recent Value  Orbital Region  Moderate depletion  Upper Arm Region  Severe depletion  Thoracic and Lumbar Region  Moderate depletion  Buccal Region  Moderate depletion  Temple Region  Severe depletion  Clavicle Bone Region  Severe depletion  Clavicle and Acromion Bone Region  Severe depletion  Scapular Bone Region  Severe depletion  Dorsal Hand  Moderate depletion  Patellar Region  Severe depletion  Anterior Thigh Region  Severe depletion  Posterior Calf Region  Severe depletion  Edema (RD Assessment)  None  Hair  Reviewed  Eyes  Reviewed  Mouth  Reviewed [dentures]  Skin  Reviewed  Nails  Reviewed       Diet Order:   Diet Order            Diet regular Room service appropriate? Yes; Fluid consistency: Thin; Fluid restriction: 1500 mL Fluid  Diet effective now              EDUCATION NEEDS:   No education needs have been identified at this time  Skin:  Skin Assessment: Reviewed RN Assessment(left toe amputation)  Last BM:  12/21  Height:   Ht Readings from Last 1 Encounters:  11/19/18 5\' 5"  (1.651 m)    Weight:   Wt Readings from Last 1 Encounters:  11/19/18 54.4 kg    Ideal Body  Weight:  56.8 kg  BMI:  Body mass index is 19.97 kg/m.  Estimated Nutritional Needs:   Kcal:  1350-1550 kcal/d  Protein:  70-80 g/d  Fluid:  1.5 L    Mauricia Area, MS, Dietetic Intern Pager: [phone] After hours Pager: [phone]

## 2018-11-22 ENCOUNTER — Other Ambulatory Visit: Payer: Self-pay

## 2018-11-22 DIAGNOSIS — E43 Unspecified severe protein-calorie malnutrition: Secondary | ICD-10-CM

## 2018-11-22 LAB — BASIC METABOLIC PANEL
Anion gap: 21 — ABNORMAL HIGH (ref 5–15)
BUN: 53 mg/dL — ABNORMAL HIGH (ref 8–23)
CO2: 22 mmol/L (ref 22–32)
Calcium: 9.4 mg/dL (ref 8.9–10.3)
Chloride: 104 mmol/L (ref 98–111)
Creatinine, Ser: 3.95 mg/dL — ABNORMAL HIGH (ref 0.44–1.00)
GFR calc Af Amer: 11 mL/min — ABNORMAL LOW (ref >60)
GFR calc non Af Amer: 9 mL/min — ABNORMAL LOW (ref >60)
GLUCOSE: 22 mg/dL — AB (ref 70–99)
Potassium: 5.3 mmol/L — ABNORMAL HIGH (ref 3.5–5.1)
Sodium: 147 mmol/L — ABNORMAL HIGH (ref 135–145)

## 2018-11-22 LAB — MAGNESIUM: Magnesium: 2.8 mg/dL — ABNORMAL HIGH (ref 1.7–2.4)

## 2018-11-22 LAB — POTASSIUM: Potassium: 3.9 mmol/L (ref 3.5–5.1)

## 2018-11-22 LAB — GLUCOSE, CAPILLARY: Glucose-Capillary: 216 mg/dL — ABNORMAL HIGH (ref 70–99)

## 2018-11-22 MED ORDER — GLIPIZIDE 5 MG PO TABS: 2.5000 mg | ORAL_TABLET | Freq: Every day | ORAL | 2 refills | Status: AC

## 2018-11-22 MED ORDER — POTASSIUM CHLORIDE ER 10 MEQ PO TBCR: 10.0000 meq | EXTENDED_RELEASE_TABLET | Freq: Every day | ORAL | 0 refills | Status: DC

## 2018-11-22 MED ORDER — CARVEDILOL 3.125 MG PO TABS: 3.1250 mg | ORAL_TABLET | Freq: Two times a day (BID) | ORAL | 0 refills | Status: AC

## 2018-11-22 MED ORDER — SPIRONOLACTONE 25 MG PO TABS: 25.0000 mg | ORAL_TABLET | Freq: Every day | ORAL | 0 refills | Status: AC

## 2018-11-22 MED ORDER — DEXTROSE 50 % IV SOLN
25.0000 mL | Freq: Once | INTRAVENOUS | Status: AC
  Administered 2018-11-22: 25 mL via INTRAVENOUS
  Filled 2018-11-22: qty 50

## 2018-11-22 MED ORDER — SPIRONOLACTONE 25 MG PO TABS: 25.0000 mg | ORAL_TABLET | Freq: Every day | ORAL | 0 refills | Status: DC

## 2018-11-22 MED ORDER — FUROSEMIDE 40 MG PO TABS: 80.0000 mg | ORAL_TABLET | Freq: Two times a day (BID) | ORAL | 0 refills | Status: AC

## 2018-11-22 NOTE — Progress Notes (Signed)
Pt axillary temp 96.0 F. Pt resting in bed and agreed to be placed back on bear hugger. Will continue to monitor.

## 2018-11-22 NOTE — Progress Notes (Signed)
Pt had a critical low BG of 22. Writer notified MD Candiss Norse and he ordered Dextrose IV. Writer administered dose and gave orange juice. Rechecked BG and recorded a value of 216. No patient complaints at this time and VS stable.

## 2018-11-22 NOTE — Plan of Care (Signed)
Nsg Discharge Note  Admit Date:  11/19/2018 Discharge date: 11/22/2018   [name] to be D/C'd Home per MD order.  AVS completed.  Copy for chart, and copy for patient signed, and dated. Patient/caregiver able to verbalize understanding.  Discharge Medication: Allergies as of 11/22/2018   No Known Allergies      Medication List     TAKE these medications    ACCU-CHEK GUIDE w/Device Kit See admin instructions.   acetaminophen 500 MG tablet Commonly known as:  TYLENOL Take 1 tablet (500 mg total) by mouth daily.   aspirin 81 MG chewable tablet Chew 81 mg by mouth daily. What changed:  Another medication with the same name was removed. Continue taking this medication, and follow the directions you see here.   atorvastatin 10 MG tablet Commonly known as:  LIPITOR Take 1 tablet (10 mg total) by mouth daily.   Calcium-Magnesium-Zinc-D3 Tabs Take 1 tablet by mouth daily.   carvedilol 3.125 MG tablet Commonly known as:  COREG Take 1 tablet (3.125 mg total) by mouth 2 (two) times daily with a meal. What changed:   medication strength how much to take   clopidogrel 75 MG tablet Commonly known as:  PLAVIX Take 1 tablet (75 mg total) by mouth daily with breakfast.   furosemide 40 MG tablet Commonly known as:  LASIX Take 2 tablets (80 mg total) by mouth 2 (two) times daily. What changed:  how much to take   glipiZIDE 5 MG tablet Commonly known as:  GLUCOTROL Take 0.5 tablets (2.5 mg total) by mouth daily before breakfast. After you have your lunch.  Do not take if your blood sugars are consistently running below 150. What changed:   when to take this additional instructions   hydrOXYzine 10 MG tablet Commonly known as:  ATARAX/VISTARIL Take 1 tablet (10 mg total) by mouth 3 (three) times daily as needed for anxiety. What changed:  when to take this   pantoprazole 20 MG tablet Commonly known as:  PROTONIX Take 1 tablet (20 mg total) by mouth daily.    polyethylene glycol packet Commonly known as:  MIRALAX / GLYCOLAX Take 17 g by mouth daily as needed (for constipation.).   Vitamin D3 50 MCG (2000 UT) capsule Take 2,000 Units by mouth daily.        Discharge Assessment: Vitals:   11/22/18 0950 11/22/18 1156  BP:    Pulse:    Resp:    Temp: (!) 97.3 F (36.3 C) (!) 97.3 F (36.3 C)  SpO2:     Skin clean, dry and intact without evidence of skin break down, no evidence of skin tears noted. IV catheter discontinued intact. Site without signs and symptoms of complications - no redness or edema noted at insertion site, patient denies c/o pain - only slight tenderness at site.  Dressing with slight pressure applied.  D/c Instructions-Education: Discharge instructions given to patient/family with verbalized understanding. D/c education completed with patient/family including follow up instructions, medication list, d/c activities limitations if indicated, with other d/c instructions as indicated by MD - patient able to verbalize understanding, all questions fully answered. Patient instructed to return to ED, call 911, or call MD for any changes in condition.  Patient escorted via [place], and D/C home via private auto.  Salley Slaughter, RN 11/22/2018 12:08 PM

## 2018-11-22 NOTE — Discharge Summary (Addendum)
                                                                                 [name] MRN:[id number] DOB: 10/14/1926 DOA: 11/19/2018  PCP: Bland, Veita, MD  Admit date: 11/19/2018  Discharge date: 11/22/2018  Admitted From: Home   Disposition:  Home   Recommendations for Outpatient Follow-up:   Follow up with PCP in 1-2 weeks  PCP Please obtain BMP/CBC, 2 view CXR in 1week,  (see Discharge instructions)   PCP Please follow up on the following pending results: Monitor BMP and diuretic dose closely   Home Health: PT, RN  Equipment/Devices: None  Consultations: None Discharge Condition: Fair   CODE STATUS: Full   Diet Recommendation: Heart Healthy with 1.5 L/day total fluid restriction     Chief Complaint  Patient presents with  . Code Sepsis     Brief history of present illness from the day of admission and additional interim summary    Alexa Dillonis a 84 y.o.femalewith medical history significant ofCAD, CHF on 3 L nasal cannula oxygen at home, type 2 diabetes, hypertension, hyperlipidemia, peripheral vascular disease, comes to the hospital with evaluation of shortness of breath and orthopnea.  Work-up consistent with CHF. Has also developed delirium and hypothermia.                                                                 Hospital Course   1.  Acute on chronic hypoxic respiratory failure in a patient was on 3 L nasal cannula oxygen at home.  This is due to acute on chronic decompensation of combined diastolic and systolic heart failure.  EF around 30% on echocardiogram done few months ago.  He was placed on IV Lasix, needed additional Zaroxolyn on 11/21/2018 with good diuresis, symptoms have resolved she is close to her baseline, her blood pressures were low normal hence Coreg dose was dropped and will continue on her lower Coreg dose upon discharge as  well.  Home Lasix dose doubled. Renal insufficiency borderline low blood pressures preclude the addition of ACE/ARB/hydralazine/nitrates.  She is eager to be discharged and so his family.  We will discharge her with home RN and PT with close outpatient PCP and primary cardiology follow-up.  Request both physicians to kindly monitor her renal function and diuretic dose closely.   2.  ARF on CKD 4.  Baseline creatinine around 3.  Renal ultrasound nonacute, stable with ongoing diuresis, PCP to continue to monitor.  Avoid nephrotoxins.  3.  Anemia of chronic disease.  Stable no acute issues.  4.  Hyperkalemia.  Resolved.  5.  Dyslipidemia.  On statin continue.    6.  Delirium.  Requiring a sitter on 11/20/2018 night.  Continue sitter for now, minimize narcotics and benzodiazepines, PRN Haldol.   Stable this morning.  Family bedside.  7.  Hypothermia.  Likely due to exposure to elements.  Also has literally no body fat.    This resolved after supportive care TSH and random cortisol were stable.    8. DM type II.    Continue home regimen.     Discharge diagnosis     Principal Problem:   Sepsis (HCC) Active Problems:   Hypertension   Dyslipidemia   CKD (chronic kidney disease) stage 4, GFR 15-29 ml/min (HCC)   CHF (congestive heart failure) (HCC)   Hypothermia   Diabetes (HCC)   AKI (acute kidney injury) (HCC)   Hyperkalemia   Protein-calorie malnutrition, severe    Discharge instructions    Discharge Instructions    Discharge instructions   Complete by:  As directed    Follow with Primary MD Bland, Veita, MD in 7 days   Get CBC, CMP, 2 view Chest X ray -  checked  by Primary MD  in 5-7 days    Activity: As tolerated with Full fall precautions use walker/cane & assistance as needed  Disposition Home    Diet: Heart Healthy .Check your Weight same time everyday, if you gain over 2 pounds, or you develop in leg swelling, experience more shortness of breath or chest pain,  call your Primary MD immediately. Follow Cardiac Low Salt Diet and 1.5 lit/day fluid restriction.  Special Instructions: If you have smoked or chewed Tobacco  in the last 2 yrs please stop smoking, stop any regular Alcohol  and or any Recreational drug use.  On your next visit with your primary care physician please Get Medicines reviewed and adjusted.  Please request your Prim.MD to go over all Hospital Tests and Procedure/Radiological results at the follow up, please get all Hospital records sent to your Prim MD by signing hospital release before you go home.  If you experience worsening of your admission symptoms, develop shortness of breath, life threatening emergency, suicidal or homicidal thoughts you must seek medical attention immediately by calling 911 or calling your MD immediately  if symptoms less severe.  You Must read complete instructions/literature along with all the possible adverse reactions/side effects for all the Medicines you take and that have been prescribed to you. Take any new Medicines after you have completely understood and accpet all the possible adverse reactions/side effects.   Increase activity slowly   Complete by:  As directed       Discharge Medications   Allergies as of 11/22/2018   No Known Allergies     Medication List    TAKE these medications   ACCU-CHEK GUIDE w/Device Kit See admin instructions.   acetaminophen 500 MG tablet Commonly known as:  TYLENOL Take 1 tablet (500 mg total) by mouth daily.   aspirin 81 MG chewable tablet Chew 81 mg by mouth daily. What changed:  Another medication with the same name was removed. Continue taking this medication, and follow the directions you see here.   atorvastatin 10 MG tablet Commonly known as:  LIPITOR Take 1 tablet (10 mg total) by mouth daily.   Calcium-Magnesium-Zinc-D3 Tabs Take 1 tablet by mouth daily.   carvedilol 3.125 MG tablet Commonly known as:  COREG Take 1 tablet (3.125 mg  total) by mouth 2 (two) times daily with a meal. What changed:    medication strength  how much to take   clopidogrel 75 MG tablet Commonly known as:  PLAVIX Take 1 tablet (75 mg total) by mouth daily with breakfast.   furosemide 40 MG tablet Commonly known as:  LASIX Take 2 tablets (80 mg total) by mouth 2 (two) times daily. What changed:    how much to take   glipiZIDE 5 MG tablet Commonly known as:  GLUCOTROL Take 0.5 tablets (2.5 mg total) by mouth daily before breakfast. After you have your lunch.  Do not take if your blood sugars are consistently running below 150. What changed:    when to take this  additional instructions   hydrOXYzine 10 MG tablet Commonly known as:  ATARAX/VISTARIL Take 1 tablet (10 mg total) by mouth 3 (three) times daily as needed for anxiety. What changed:  when to take this   pantoprazole 20 MG tablet Commonly known as:  PROTONIX Take 1 tablet (20 mg total) by mouth daily.   polyethylene glycol packet Commonly known as:  MIRALAX / GLYCOLAX Take 17 g by mouth daily as needed (for constipation.).   spironolactone 25 MG tablet Commonly known as:  ALDACTONE Take 1 tablet (25 mg total) by mouth daily.   Vitamin D3 50 MCG (2000 UT) capsule Take 2,000 Units by mouth daily.       Follow-up Information    Bland, Veita, MD. Schedule an appointment as soon as possible for a visit in 1 week(s).   Specialty:  Family Medicine Contact information: [address] [place] [address] [phone]        Hilty, Kenneth C, MD. Schedule an appointment as soon as possible for a visit in 1 week(s).   Specialty:  Cardiology Contact information: [address] [place] [address] [phone]        [facility] Follow up.   Specialty:  Home Health Services Why:  home health services arranged Contact information: [address] [place]  [address] [phone]           Major  procedures and Radiology Reports - PLEASE review detailed and final reports thoroughly  -         Us Renal  Result Date: 11/20/2018 CLINICAL DATA:  Acute onset of renal insufficiency. EXAM: RENAL / URINARY TRACT ULTRASOUND COMPLETE COMPARISON:  None. FINDINGS: Right Kidney: Renal measurements: 9.5 x 3.3 x 4.5 cm = volume: 73.1 mL. Increased parenchymal echogenicity is noted. No mass or hydronephrosis visualized. Left Kidney: Renal measurements: 8.1 x 3.3 x 3.7 cm = volume: 50.3 mL. Increased parenchymal echogenicity is noted. No mass or hydronephrosis visualized. Bladder: Appears normal for degree of bladder distention. IMPRESSION: 1. No evidence of hydronephrosis. 2. Increased renal parenchymal echogenicity raises concern for medical renal disease. Electronically Signed   By: Jeffery  Chang M.D.   On: 11/20/2018 02:07   Dg Chest Port 1 View  Result Date: 11/21/2018 CLINICAL DATA:  Acute onset of shortness of breath and generalized weakness. EXAM: PORTABLE CHEST 1 VIEW COMPARISON:  Chest radiograph performed 11/19/2018 FINDINGS: Worsening moderate right and small left pleural effusions are noted, with increasing interstitial markings and vascular congestion, concerning for worsening pulmonary edema. No pneumothorax is seen. The cardiomediastinal silhouette is borderline normal in size. No acute osseous abnormalities are identified. IMPRESSION: Worsening moderate right and small left pleural effusions, with increasing interstitial markings and vascular congestion, concerning for worsening pulmonary edema. Electronically Signed   By: Jeffery  Chang M.D.   On: 11/21/2018 07:02   Dg Chest Port 1 View  Result Date: 11/19/2018 CLINICAL DATA:  Shortness of breath, leg pain.  History of CHF. EXAM: PORTABLE CHEST 1 VIEW COMPARISON:  Chest radiograph October 17, 2018 FINDINGS: Cardiac silhouette is mildly enlarged and unchanged. Calcified aortic arch. Diffuse interstitial prominence with RIGHT > LEFT  bibasilar airspace   opacities. Small pleural effusions. No pneumothorax. Osteopenia. IMPRESSION: 1. Interstitial prominence with increased bibasilar atelectasis, confluent edema versus pneumonia. Small pleural effusions. 2. Stable cardiomegaly. 3.  Aortic Atherosclerosis (ICD10-I70.0). Electronically Signed   By: Courtnay  Bloomer M.D.   On: 11/19/2018 22:54    Micro Results     Recent Results (from the past 240 hour(s))  Blood Culture (routine x 2)     Status: None (Preliminary result)   Collection Time: 11/19/18 10:00 PM  Result Value Ref Range Status   Specimen Description BLOOD RIGHT FOREARM  Final   Special Requests   Final    BOTTLES DRAWN AEROBIC AND ANAEROBIC Blood Culture adequate volume   Culture   Final    NO GROWTH 3 DAYS Performed at [facility], [address] [place], [place] [address]    Report Status PENDING  Incomplete  Blood Culture (routine x 2)     Status: None (Preliminary result)   Collection Time: 11/19/18 10:15 PM  Result Value Ref Range Status   Specimen Description BLOOD LEFT ANTECUBITAL  Final   Special Requests   Final    BOTTLES DRAWN AEROBIC AND ANAEROBIC Blood Culture adequate volume   Culture   Final    NO GROWTH 3 DAYS Performed at [facility], [address] [place], [place] [address]    Report Status PENDING  Incomplete  MRSA PCR Screening     Status: None   Collection Time: 11/20/18  3:28 PM  Result Value Ref Range Status   MRSA by PCR NEGATIVE NEGATIVE Final    Comment:        The GeneXpert MRSA Assay (FDA approved for NASAL specimens only), is one component of a comprehensive MRSA colonization surveillance program. It is not intended to diagnose MRSA infection nor to guide or monitor treatment for MRSA infections. Performed at [facility], [address] [place], [place] [address]     Today   Subjective    [name] today has no headache,no chest abdominal pain,no new weakness tingling or  numbness, feels much better wants to go home today.     Objective   Blood pressure 120/61, pulse 69, temperature (!) 97.3 F (36.3 C), temperature source Axillary, resp. rate (!) 21, height 5' 5" (1.651 m), weight 54.4 kg, SpO2 100 %.   Intake/Output Summary (Last 24 hours) at 11/22/2018 1345 Last data filed at 11/22/2018 0248 Gross per 24 hour  Intake -  Output 650 ml  Net -650 ml    Exam  Awake Alert, Oriented x 3, No new F.N deficits, Normal affect [place].AT,PERRAL Supple Neck,No JVD, No cervical lymphadenopathy appriciated.  Symmetrical Chest wall movement, Good air movement bilaterally, CTAB RRR,No Gallops,Rubs or new Murmurs, No Parasternal Heave +ve B.Sounds, Abd Soft, Non tender, No organomegaly appriciated, No rebound -guarding or rigidity. No Cyanosis, Clubbing or edema, No new Rash or bruise   Data Review   CBC w Diff:  Lab Results  Component Value Date   WBC 4.7 11/19/2018   HGB 10.1 (L) 11/19/2018   HGB 10.4 (L) 11/02/2018   HCT 33.7 (L) 11/19/2018   HCT 33.6 (L) 11/02/2018   PLT 167 11/19/2018   PLT 272 11/02/2018   LYMPHOPCT 27 11/19/2018   MONOPCT 10 11/19/2018   EOSPCT 2 11/19/2018   BASOPCT 0 11/19/2018    CMP:  Lab Results  Component Value Date   NA 147 (H) 11/22/2018   NA 143 11/02/2018   K 3.9 11/22/2018     CL 104 11/22/2018   CO2 22 11/22/2018   BUN 53 (H) 11/22/2018   BUN 35 11/02/2018   CREATININE 3.95 (H) 11/22/2018   CREATININE 2.24 (H) 05/25/2018   PROT 8.7 (H) 11/19/2018   ALBUMIN 3.2 (L) 11/19/2018   BILITOT 1.0 11/19/2018   BILITOT 0.3 05/25/2018   ALKPHOS 60 11/19/2018   AST 42 (H) 11/19/2018   AST 59 (H) 05/25/2018   ALT 18 11/19/2018   ALT 33 05/25/2018    Total Time in preparing paper work, data evaluation and todays exam - 26 minutes  Lala Lund M.D on 11/22/2018 at 1:45 PM  [place]  [phone]

## 2018-11-22 NOTE — Discharge Instructions (Signed)
Follow with Primary MD Lucianne Lei, MD in 7 days   Get CBC, CMP, 2 view Chest X ray -  checked  by Primary MD  in 5-7 days    Activity: As tolerated with Full fall precautions use walker/cane & assistance as needed  Disposition Home    Diet: Heart Healthy .Check your Weight same time everyday, if you gain over 2 pounds, or you develop in leg swelling, experience more shortness of breath or chest pain, call your Primary MD immediately. Follow Cardiac Low Salt Diet and 1.5 lit/day fluid restriction.  Special Instructions: If you have smoked or chewed Tobacco  in the last 2 yrs please stop smoking, stop any regular Alcohol  and or any Recreational drug use.  On your next visit with your primary care physician please Get Medicines reviewed and adjusted.  Please request your Prim.MD to go over all Hospital Tests and Procedure/Radiological results at the follow up, please get all Hospital records sent to your Prim MD by signing hospital release before you go home.  If you experience worsening of your admission symptoms, develop shortness of breath, life threatening emergency, suicidal or homicidal thoughts you must seek medical attention immediately by calling 911 or calling your MD immediately  if symptoms less severe.  You Must read complete instructions/literature along with all the possible adverse reactions/side effects for all the Medicines you take and that have been prescribed to you. Take any new Medicines after you have completely understood and accpet all the possible adverse reactions/side effects.

## 2018-11-22 NOTE — Care Management Note (Signed)
Case Management Note  Patient Details  Name: [name] MRN: [id number] Date of Birth: [birth year]  Subjective/Objective:               Sepsis /CHF.Resides with son Ezell.    Ahmed Prima [address] Lishel [place] [phone]     BPZ:WCHEN Criss Rosales  Action/Plan: Transition to home with home health services to follow. Granddaughter to provide transportation to home.  Expected Discharge Date:  11/22/18               Expected Discharge Plan:  [place]  In-House Referral:     Discharge planning Services  CM Consult  Post Acute Care Choice:  Home Health, Resumption of Svcs/PTA Provider Choice offered to:  Patient  DME Arranged:  N/A DME Agency:  NA  HH Arranged:  PT, RN Alexa Agency:  [place]  Status of Service:  Completed, signed off  If discussed at [place] of Stay Meetings, dates discussed:    Additional Comments:  Sharin Mons, RN 11/22/2018, 10:44 AM

## 2018-11-22 NOTE — Patient Outreach (Signed)
[facility]) Care Management  11/22/2018  [name] 04-07-1926 [id number]   Follow up call to patient's granddaughter, [name], regarding referral to [employer] for home modification. [name] has not yet been contacted by Kermit Balo from [facility].  BSW will follow up with [name] after the upcoming holiday and will provide update to [name] at that time.  Ronn Melena, BSW Social Worker [phone]

## 2018-11-22 NOTE — Consult Note (Signed)
   [name]   11/22/2018  [name] [birth year] [id number]  Patient is currently active with [facility] for chronic disease management services.  Patient has been engaged by a [place] and [facility] social worker.  Our community based plan of care has focused on disease management and community resource support.   Met with patient and granddaughter, Norvel Richards at the bedside.  Patient was attempting to get out of bed with nursing staff member.  Patient moving very slowly but trying.  Granddaughter endorses ongoing needs with [facility] follow up. Patient will receive a post hospital call and will be evaluated for ongoing disease management and disease process education.  Of note, [facility] Care Management services does not replace or interfere with any services that are needed or arranged by inpatient case management or social work.  For additional questions or referrals please contact:  Natividad Brood, RN BSN [facility] Liaison  [phone] business mobile phone Toll free office [phone]

## 2018-11-24 LAB — CULTURE, BLOOD (ROUTINE X 2)
CULTURE: NO GROWTH
Culture: NO GROWTH
Special Requests: ADEQUATE
Special Requests: ADEQUATE

## 2018-11-25 ENCOUNTER — Other Ambulatory Visit: Payer: Self-pay | Admitting: *Deleted

## 2018-11-25 NOTE — Patient Outreach (Signed)
[facility]) Care Management  11/25/2018  [name] 1926/01/19 [id number]   Noted member hospitalized 12/21-12/24 with sepsis, possibly community acquired pneumonia.  Primary MD will complete transition of care assessment.  Call placed to caregiver/granddaughter, [name], no answer.  HIPAA compliant voice message left.  Will follow up within the next 4 business days.  Valente David, [place], MSN [place] [phone]

## 2018-11-28 ENCOUNTER — Ambulatory Visit: Payer: Medicaid Other | Admitting: Family

## 2018-11-28 ENCOUNTER — Other Ambulatory Visit: Payer: Self-pay

## 2018-11-28 NOTE — Telephone Encounter (Signed)
This encounter was created in error - please disregard.

## 2018-11-28 NOTE — Patient Outreach (Addendum)
[facility]) Care Management  11/28/2018  WENDEE HATA 02/04/1926 [id number]    2nd Unsuccessful phone call attempt. Called member relating to recent hospital discharge and left HIPPA Compliant voicemail message requesting return call. Unsuccessful Outreach Letter sent to member.  Will call member in 3-4 business days.   [name], RN-BSN  [facility] Care Management  Community Care Management Coordinator  [phone] [email or web address]

## 2018-12-01 ENCOUNTER — Other Ambulatory Visit: Payer: Self-pay | Admitting: *Deleted

## 2018-12-01 NOTE — Patient Outreach (Signed)
[facility]) Care Management  12/01/2018  [name] 05-Feb-1926 [id number]   Notified by care management assistant that member triggered red for EMMI heart failure dashboard yesterday for not weighing herself.  Call placed to [facility] granddaughter/caregiver, Lishel, to follow up on current health status.  No answer, HIPAA compliant voice message.  This is 3rd unsuccessful outreach, will close case within the next week if no response.    Valente David, [place], MSN [place] [phone]

## 2018-12-02 ENCOUNTER — Ambulatory Visit: Payer: Self-pay

## 2018-12-02 DIAGNOSIS — Z7902 Long term (current) use of antithrombotics/antiplatelets: Secondary | ICD-10-CM | POA: Diagnosis not present

## 2018-12-02 DIAGNOSIS — Z7984 Long term (current) use of oral hypoglycemic drugs: Secondary | ICD-10-CM | POA: Diagnosis not present

## 2018-12-02 DIAGNOSIS — Z89412 Acquired absence of left great toe: Secondary | ICD-10-CM | POA: Diagnosis not present

## 2018-12-02 DIAGNOSIS — D631 Anemia in chronic kidney disease: Secondary | ICD-10-CM | POA: Diagnosis not present

## 2018-12-02 DIAGNOSIS — Z7982 Long term (current) use of aspirin: Secondary | ICD-10-CM | POA: Diagnosis not present

## 2018-12-02 DIAGNOSIS — E1151 Type 2 diabetes mellitus with diabetic peripheral angiopathy without gangrene: Secondary | ICD-10-CM | POA: Diagnosis not present

## 2018-12-02 DIAGNOSIS — I700 Atherosclerosis of aorta: Secondary | ICD-10-CM | POA: Diagnosis not present

## 2018-12-02 DIAGNOSIS — J9611 Chronic respiratory failure with hypoxia: Secondary | ICD-10-CM | POA: Diagnosis not present

## 2018-12-02 DIAGNOSIS — K219 Gastro-esophageal reflux disease without esophagitis: Secondary | ICD-10-CM | POA: Diagnosis not present

## 2018-12-02 DIAGNOSIS — N184 Chronic kidney disease, stage 4 (severe): Secondary | ICD-10-CM | POA: Diagnosis not present

## 2018-12-02 DIAGNOSIS — I5042 Chronic combined systolic (congestive) and diastolic (congestive) heart failure: Secondary | ICD-10-CM | POA: Diagnosis not present

## 2018-12-02 DIAGNOSIS — E785 Hyperlipidemia, unspecified: Secondary | ICD-10-CM | POA: Diagnosis not present

## 2018-12-02 DIAGNOSIS — I130 Hypertensive heart and chronic kidney disease with heart failure and stage 1 through stage 4 chronic kidney disease, or unspecified chronic kidney disease: Secondary | ICD-10-CM | POA: Diagnosis not present

## 2018-12-02 DIAGNOSIS — E1122 Type 2 diabetes mellitus with diabetic chronic kidney disease: Secondary | ICD-10-CM | POA: Diagnosis not present

## 2018-12-02 DIAGNOSIS — I2510 Atherosclerotic heart disease of native coronary artery without angina pectoris: Secondary | ICD-10-CM | POA: Diagnosis not present

## 2018-12-02 DIAGNOSIS — M1990 Unspecified osteoarthritis, unspecified site: Secondary | ICD-10-CM | POA: Diagnosis not present

## 2018-12-02 DIAGNOSIS — E43 Unspecified severe protein-calorie malnutrition: Secondary | ICD-10-CM | POA: Diagnosis not present

## 2018-12-06 DIAGNOSIS — I2510 Atherosclerotic heart disease of native coronary artery without angina pectoris: Secondary | ICD-10-CM | POA: Diagnosis not present

## 2018-12-06 DIAGNOSIS — L97222 Non-pressure chronic ulcer of left calf with fat layer exposed: Secondary | ICD-10-CM | POA: Diagnosis not present

## 2018-12-06 DIAGNOSIS — I5042 Chronic combined systolic (congestive) and diastolic (congestive) heart failure: Secondary | ICD-10-CM | POA: Diagnosis not present

## 2018-12-06 DIAGNOSIS — E11622 Type 2 diabetes mellitus with other skin ulcer: Secondary | ICD-10-CM | POA: Diagnosis not present

## 2018-12-06 DIAGNOSIS — D631 Anemia in chronic kidney disease: Secondary | ICD-10-CM | POA: Diagnosis not present

## 2018-12-06 DIAGNOSIS — I130 Hypertensive heart and chronic kidney disease with heart failure and stage 1 through stage 4 chronic kidney disease, or unspecified chronic kidney disease: Secondary | ICD-10-CM | POA: Diagnosis not present

## 2018-12-06 DIAGNOSIS — M1990 Unspecified osteoarthritis, unspecified site: Secondary | ICD-10-CM | POA: Diagnosis not present

## 2018-12-06 DIAGNOSIS — K219 Gastro-esophageal reflux disease without esophagitis: Secondary | ICD-10-CM | POA: Diagnosis not present

## 2018-12-06 DIAGNOSIS — I700 Atherosclerosis of aorta: Secondary | ICD-10-CM | POA: Diagnosis not present

## 2018-12-06 DIAGNOSIS — T8189XA Other complications of procedures, not elsewhere classified, initial encounter: Secondary | ICD-10-CM | POA: Diagnosis not present

## 2018-12-06 DIAGNOSIS — E1122 Type 2 diabetes mellitus with diabetic chronic kidney disease: Secondary | ICD-10-CM | POA: Diagnosis not present

## 2018-12-06 DIAGNOSIS — Z89412 Acquired absence of left great toe: Secondary | ICD-10-CM | POA: Diagnosis not present

## 2018-12-06 DIAGNOSIS — I5023 Acute on chronic systolic (congestive) heart failure: Secondary | ICD-10-CM | POA: Diagnosis not present

## 2018-12-06 DIAGNOSIS — J9611 Chronic respiratory failure with hypoxia: Secondary | ICD-10-CM | POA: Diagnosis not present

## 2018-12-06 DIAGNOSIS — R0602 Shortness of breath: Secondary | ICD-10-CM | POA: Diagnosis not present

## 2018-12-06 DIAGNOSIS — E43 Unspecified severe protein-calorie malnutrition: Secondary | ICD-10-CM | POA: Diagnosis not present

## 2018-12-06 DIAGNOSIS — Z7984 Long term (current) use of oral hypoglycemic drugs: Secondary | ICD-10-CM | POA: Diagnosis not present

## 2018-12-06 DIAGNOSIS — E1151 Type 2 diabetes mellitus with diabetic peripheral angiopathy without gangrene: Secondary | ICD-10-CM | POA: Diagnosis not present

## 2018-12-06 DIAGNOSIS — Z7902 Long term (current) use of antithrombotics/antiplatelets: Secondary | ICD-10-CM | POA: Diagnosis not present

## 2018-12-06 DIAGNOSIS — E785 Hyperlipidemia, unspecified: Secondary | ICD-10-CM | POA: Diagnosis not present

## 2018-12-06 DIAGNOSIS — Z7982 Long term (current) use of aspirin: Secondary | ICD-10-CM | POA: Diagnosis not present

## 2018-12-06 DIAGNOSIS — N184 Chronic kidney disease, stage 4 (severe): Secondary | ICD-10-CM | POA: Diagnosis not present

## 2018-12-08 DIAGNOSIS — E11622 Type 2 diabetes mellitus with other skin ulcer: Secondary | ICD-10-CM | POA: Diagnosis not present

## 2018-12-08 DIAGNOSIS — Z89412 Acquired absence of left great toe: Secondary | ICD-10-CM | POA: Diagnosis not present

## 2018-12-08 DIAGNOSIS — E785 Hyperlipidemia, unspecified: Secondary | ICD-10-CM | POA: Diagnosis not present

## 2018-12-08 DIAGNOSIS — I2510 Atherosclerotic heart disease of native coronary artery without angina pectoris: Secondary | ICD-10-CM | POA: Diagnosis not present

## 2018-12-08 DIAGNOSIS — R0602 Shortness of breath: Secondary | ICD-10-CM | POA: Diagnosis not present

## 2018-12-08 DIAGNOSIS — E43 Unspecified severe protein-calorie malnutrition: Secondary | ICD-10-CM | POA: Diagnosis not present

## 2018-12-08 DIAGNOSIS — Z7982 Long term (current) use of aspirin: Secondary | ICD-10-CM | POA: Diagnosis not present

## 2018-12-08 DIAGNOSIS — I130 Hypertensive heart and chronic kidney disease with heart failure and stage 1 through stage 4 chronic kidney disease, or unspecified chronic kidney disease: Secondary | ICD-10-CM | POA: Diagnosis not present

## 2018-12-08 DIAGNOSIS — K219 Gastro-esophageal reflux disease without esophagitis: Secondary | ICD-10-CM | POA: Diagnosis not present

## 2018-12-08 DIAGNOSIS — E1122 Type 2 diabetes mellitus with diabetic chronic kidney disease: Secondary | ICD-10-CM | POA: Diagnosis not present

## 2018-12-08 DIAGNOSIS — J9611 Chronic respiratory failure with hypoxia: Secondary | ICD-10-CM | POA: Diagnosis not present

## 2018-12-08 DIAGNOSIS — E1151 Type 2 diabetes mellitus with diabetic peripheral angiopathy without gangrene: Secondary | ICD-10-CM | POA: Diagnosis not present

## 2018-12-08 DIAGNOSIS — Z7902 Long term (current) use of antithrombotics/antiplatelets: Secondary | ICD-10-CM | POA: Diagnosis not present

## 2018-12-08 DIAGNOSIS — T8189XA Other complications of procedures, not elsewhere classified, initial encounter: Secondary | ICD-10-CM | POA: Diagnosis not present

## 2018-12-08 DIAGNOSIS — I5042 Chronic combined systolic (congestive) and diastolic (congestive) heart failure: Secondary | ICD-10-CM | POA: Diagnosis not present

## 2018-12-08 DIAGNOSIS — M1990 Unspecified osteoarthritis, unspecified site: Secondary | ICD-10-CM | POA: Diagnosis not present

## 2018-12-08 DIAGNOSIS — L97222 Non-pressure chronic ulcer of left calf with fat layer exposed: Secondary | ICD-10-CM | POA: Diagnosis not present

## 2018-12-08 DIAGNOSIS — Z7984 Long term (current) use of oral hypoglycemic drugs: Secondary | ICD-10-CM | POA: Diagnosis not present

## 2018-12-08 DIAGNOSIS — D631 Anemia in chronic kidney disease: Secondary | ICD-10-CM | POA: Diagnosis not present

## 2018-12-08 DIAGNOSIS — I700 Atherosclerosis of aorta: Secondary | ICD-10-CM | POA: Diagnosis not present

## 2018-12-08 DIAGNOSIS — I5023 Acute on chronic systolic (congestive) heart failure: Secondary | ICD-10-CM | POA: Diagnosis not present

## 2018-12-08 DIAGNOSIS — N184 Chronic kidney disease, stage 4 (severe): Secondary | ICD-10-CM | POA: Diagnosis not present

## 2018-12-09 ENCOUNTER — Other Ambulatory Visit: Payer: Self-pay | Admitting: *Deleted

## 2018-12-09 DIAGNOSIS — Z7982 Long term (current) use of aspirin: Secondary | ICD-10-CM | POA: Diagnosis not present

## 2018-12-09 DIAGNOSIS — D631 Anemia in chronic kidney disease: Secondary | ICD-10-CM | POA: Diagnosis not present

## 2018-12-09 DIAGNOSIS — E785 Hyperlipidemia, unspecified: Secondary | ICD-10-CM | POA: Diagnosis not present

## 2018-12-09 DIAGNOSIS — K219 Gastro-esophageal reflux disease without esophagitis: Secondary | ICD-10-CM | POA: Diagnosis not present

## 2018-12-09 DIAGNOSIS — I700 Atherosclerosis of aorta: Secondary | ICD-10-CM | POA: Diagnosis not present

## 2018-12-09 DIAGNOSIS — E1151 Type 2 diabetes mellitus with diabetic peripheral angiopathy without gangrene: Secondary | ICD-10-CM | POA: Diagnosis not present

## 2018-12-09 DIAGNOSIS — Z7902 Long term (current) use of antithrombotics/antiplatelets: Secondary | ICD-10-CM | POA: Diagnosis not present

## 2018-12-09 DIAGNOSIS — I130 Hypertensive heart and chronic kidney disease with heart failure and stage 1 through stage 4 chronic kidney disease, or unspecified chronic kidney disease: Secondary | ICD-10-CM | POA: Diagnosis not present

## 2018-12-09 DIAGNOSIS — J9611 Chronic respiratory failure with hypoxia: Secondary | ICD-10-CM | POA: Diagnosis not present

## 2018-12-09 DIAGNOSIS — Z7984 Long term (current) use of oral hypoglycemic drugs: Secondary | ICD-10-CM | POA: Diagnosis not present

## 2018-12-09 DIAGNOSIS — Z89412 Acquired absence of left great toe: Secondary | ICD-10-CM | POA: Diagnosis not present

## 2018-12-09 DIAGNOSIS — I2510 Atherosclerotic heart disease of native coronary artery without angina pectoris: Secondary | ICD-10-CM | POA: Diagnosis not present

## 2018-12-09 DIAGNOSIS — M1990 Unspecified osteoarthritis, unspecified site: Secondary | ICD-10-CM | POA: Diagnosis not present

## 2018-12-09 DIAGNOSIS — N184 Chronic kidney disease, stage 4 (severe): Secondary | ICD-10-CM | POA: Diagnosis not present

## 2018-12-09 DIAGNOSIS — I5042 Chronic combined systolic (congestive) and diastolic (congestive) heart failure: Secondary | ICD-10-CM | POA: Diagnosis not present

## 2018-12-09 DIAGNOSIS — E1122 Type 2 diabetes mellitus with diabetic chronic kidney disease: Secondary | ICD-10-CM | POA: Diagnosis not present

## 2018-12-09 DIAGNOSIS — E43 Unspecified severe protein-calorie malnutrition: Secondary | ICD-10-CM | POA: Diagnosis not present

## 2018-12-09 NOTE — Patient Outreach (Signed)
[facility]) Care Management  12/09/2018  [name] Apr 01, 1926 [id number]   No response from member/granddaughter after multiple unsuccessful outreach attempts and letter.  Will close to nursing at this time.  Remains open with BSW for community resources.  Will notify BSW and primary MD of nursing closure.  Valente David, [place], MSN [place] [phone]

## 2018-12-12 DIAGNOSIS — M1990 Unspecified osteoarthritis, unspecified site: Secondary | ICD-10-CM | POA: Diagnosis not present

## 2018-12-12 DIAGNOSIS — I5042 Chronic combined systolic (congestive) and diastolic (congestive) heart failure: Secondary | ICD-10-CM | POA: Diagnosis not present

## 2018-12-12 DIAGNOSIS — D631 Anemia in chronic kidney disease: Secondary | ICD-10-CM | POA: Diagnosis not present

## 2018-12-12 DIAGNOSIS — I130 Hypertensive heart and chronic kidney disease with heart failure and stage 1 through stage 4 chronic kidney disease, or unspecified chronic kidney disease: Secondary | ICD-10-CM | POA: Diagnosis not present

## 2018-12-12 DIAGNOSIS — E43 Unspecified severe protein-calorie malnutrition: Secondary | ICD-10-CM | POA: Diagnosis not present

## 2018-12-12 DIAGNOSIS — Z7982 Long term (current) use of aspirin: Secondary | ICD-10-CM | POA: Diagnosis not present

## 2018-12-12 DIAGNOSIS — I700 Atherosclerosis of aorta: Secondary | ICD-10-CM | POA: Diagnosis not present

## 2018-12-12 DIAGNOSIS — E1151 Type 2 diabetes mellitus with diabetic peripheral angiopathy without gangrene: Secondary | ICD-10-CM | POA: Diagnosis not present

## 2018-12-12 DIAGNOSIS — E785 Hyperlipidemia, unspecified: Secondary | ICD-10-CM | POA: Diagnosis not present

## 2018-12-12 DIAGNOSIS — Z7902 Long term (current) use of antithrombotics/antiplatelets: Secondary | ICD-10-CM | POA: Diagnosis not present

## 2018-12-12 DIAGNOSIS — Z7984 Long term (current) use of oral hypoglycemic drugs: Secondary | ICD-10-CM | POA: Diagnosis not present

## 2018-12-12 DIAGNOSIS — J9611 Chronic respiratory failure with hypoxia: Secondary | ICD-10-CM | POA: Diagnosis not present

## 2018-12-12 DIAGNOSIS — E1122 Type 2 diabetes mellitus with diabetic chronic kidney disease: Secondary | ICD-10-CM | POA: Diagnosis not present

## 2018-12-12 DIAGNOSIS — K219 Gastro-esophageal reflux disease without esophagitis: Secondary | ICD-10-CM | POA: Diagnosis not present

## 2018-12-12 DIAGNOSIS — N184 Chronic kidney disease, stage 4 (severe): Secondary | ICD-10-CM | POA: Diagnosis not present

## 2018-12-12 DIAGNOSIS — Z89412 Acquired absence of left great toe: Secondary | ICD-10-CM | POA: Diagnosis not present

## 2018-12-12 DIAGNOSIS — I2510 Atherosclerotic heart disease of native coronary artery without angina pectoris: Secondary | ICD-10-CM | POA: Diagnosis not present

## 2018-12-13 ENCOUNTER — Other Ambulatory Visit: Payer: Self-pay

## 2018-12-13 DIAGNOSIS — E43 Unspecified severe protein-calorie malnutrition: Secondary | ICD-10-CM | POA: Diagnosis not present

## 2018-12-13 DIAGNOSIS — E785 Hyperlipidemia, unspecified: Secondary | ICD-10-CM | POA: Diagnosis not present

## 2018-12-13 DIAGNOSIS — I5023 Acute on chronic systolic (congestive) heart failure: Secondary | ICD-10-CM | POA: Diagnosis not present

## 2018-12-13 DIAGNOSIS — E11622 Type 2 diabetes mellitus with other skin ulcer: Secondary | ICD-10-CM | POA: Diagnosis not present

## 2018-12-13 DIAGNOSIS — L97222 Non-pressure chronic ulcer of left calf with fat layer exposed: Secondary | ICD-10-CM | POA: Diagnosis not present

## 2018-12-13 DIAGNOSIS — M1990 Unspecified osteoarthritis, unspecified site: Secondary | ICD-10-CM | POA: Diagnosis not present

## 2018-12-13 DIAGNOSIS — I130 Hypertensive heart and chronic kidney disease with heart failure and stage 1 through stage 4 chronic kidney disease, or unspecified chronic kidney disease: Secondary | ICD-10-CM | POA: Diagnosis not present

## 2018-12-13 DIAGNOSIS — R0602 Shortness of breath: Secondary | ICD-10-CM | POA: Diagnosis not present

## 2018-12-13 DIAGNOSIS — E1122 Type 2 diabetes mellitus with diabetic chronic kidney disease: Secondary | ICD-10-CM | POA: Diagnosis not present

## 2018-12-13 DIAGNOSIS — Z89412 Acquired absence of left great toe: Secondary | ICD-10-CM | POA: Diagnosis not present

## 2018-12-13 DIAGNOSIS — J9611 Chronic respiratory failure with hypoxia: Secondary | ICD-10-CM | POA: Diagnosis not present

## 2018-12-13 DIAGNOSIS — Z7984 Long term (current) use of oral hypoglycemic drugs: Secondary | ICD-10-CM | POA: Diagnosis not present

## 2018-12-13 DIAGNOSIS — I2510 Atherosclerotic heart disease of native coronary artery without angina pectoris: Secondary | ICD-10-CM | POA: Diagnosis not present

## 2018-12-13 DIAGNOSIS — Z7902 Long term (current) use of antithrombotics/antiplatelets: Secondary | ICD-10-CM | POA: Diagnosis not present

## 2018-12-13 DIAGNOSIS — K219 Gastro-esophageal reflux disease without esophagitis: Secondary | ICD-10-CM | POA: Diagnosis not present

## 2018-12-13 DIAGNOSIS — D631 Anemia in chronic kidney disease: Secondary | ICD-10-CM | POA: Diagnosis not present

## 2018-12-13 DIAGNOSIS — I5042 Chronic combined systolic (congestive) and diastolic (congestive) heart failure: Secondary | ICD-10-CM | POA: Diagnosis not present

## 2018-12-13 DIAGNOSIS — Z7982 Long term (current) use of aspirin: Secondary | ICD-10-CM | POA: Diagnosis not present

## 2018-12-13 DIAGNOSIS — N184 Chronic kidney disease, stage 4 (severe): Secondary | ICD-10-CM | POA: Diagnosis not present

## 2018-12-13 DIAGNOSIS — I700 Atherosclerosis of aorta: Secondary | ICD-10-CM | POA: Diagnosis not present

## 2018-12-13 DIAGNOSIS — T8189XA Other complications of procedures, not elsewhere classified, initial encounter: Secondary | ICD-10-CM | POA: Diagnosis not present

## 2018-12-13 DIAGNOSIS — E1151 Type 2 diabetes mellitus with diabetic peripheral angiopathy without gangrene: Secondary | ICD-10-CM | POA: Diagnosis not present

## 2018-12-13 NOTE — Patient Outreach (Signed)
[facility]) Care Management  12/13/2018  [name] 1926-05-18 [id number]   BSW messaged [name] Hopkins from [employer] regarding status of referral for home modifications submitted on 11/14/18.  BSW will follow up with patient when response is received.    Ronn Melena, BSW Social Worker [phone]

## 2018-12-15 ENCOUNTER — Encounter: Payer: Self-pay | Admitting: Internal Medicine

## 2018-12-15 ENCOUNTER — Ambulatory Visit (INDEPENDENT_AMBULATORY_CARE_PROVIDER_SITE_OTHER): Payer: Medicare Other | Admitting: Internal Medicine

## 2018-12-15 VITALS — BP 104/70 | HR 78 | Ht 65.5 in | Wt 108.6 lb

## 2018-12-15 DIAGNOSIS — E785 Hyperlipidemia, unspecified: Secondary | ICD-10-CM | POA: Diagnosis not present

## 2018-12-15 DIAGNOSIS — N184 Chronic kidney disease, stage 4 (severe): Secondary | ICD-10-CM

## 2018-12-15 DIAGNOSIS — I10 Essential (primary) hypertension: Secondary | ICD-10-CM

## 2018-12-15 DIAGNOSIS — I5042 Chronic combined systolic (congestive) and diastolic (congestive) heart failure: Secondary | ICD-10-CM | POA: Diagnosis not present

## 2018-12-15 DIAGNOSIS — I739 Peripheral vascular disease, unspecified: Secondary | ICD-10-CM | POA: Diagnosis not present

## 2018-12-15 NOTE — Progress Notes (Signed)
OFFICE NOTE  Chief Complaint:  Office follow-up  Primary Care Physician: Lucianne Lei, MD  HPI:  [name] is a 85 y.o. female with a past medial history significant for the following as per Almyra Deforest, PA-C's note: "HTN, HLD, anemia of chronic disease, CKD and PVD s/p left femoral enterectomy.  Patient was admitted in October with chest pain and shortness of breath.  D-dimer was elevated at 1.16, hemoglobin low at 8.8.  Patient was treated with IV Lasix.  Echocardiogram showed newly depressed LVEF of 20 to 25% with suspicion for Takotsubo cardiomyopathy or multivessel CAD.  Invasive evaluation with cardiac catheterization was deferred given her comorbidities.  She was discharged on 10/15 however he returned to [facility] on 09/15/2018.  She was treated further with IV Lasix.  Her discharge weight on 10/19 was 55.5 kg.  Patient returned to the hospital for another admission 3 days later with sudden onset of slurred speech, expressive aphasia and left-sided facial droop.  Stroke work-up was negative with negative MRI of the brain.  Patient was discharged on 325 mg aspirin on top of Plavix.  Echocardiogram obtained on 09/21/2018 showed EF 25 to 30%, akinesis of mid-apical myocardium, PA peak pressure 35 mmHg.  Since discharge, patient was seen in the [facility] heart failure clinic on 09/28/2018.  Most recently, patient was readmitted from 11/16-11/19 with acute on chronic systolic heart failure.  Volume status improved however her creatinine trended up to 3.02.  She was discharged on 40 mg twice daily of Lasix.  Her new discharge weight was 51.9 kg or 114.4 lbs. She did not show for her most recent heart failure clinic visit on 10/26/2018."  Ms. Pundt returns today for follow-up.  She does seem to be somewhat somnolent.  She is accompanied I believe by her son.  He notes that she has had stable weight and no worsening shortness of breath.  Infections lost some weight and her appetite  has declined.  She has an upcoming appointment with Dr. Brett Fairy in [facility] neurology.  She is currently on 80 mg twice daily of Lasix.  PMHx:  Past Medical History:  Diagnosis Date  . ACS (acute coronary syndrome) ([place]) 08/2018  . Anemia    family unsure of this history  . Arthritis   . CHF (congestive heart failure) ([place])   . Chronic ulcer of calf (HCC)    Left  . Constipation   . Diabetes mellitus without complication ([place])   . Dyslipidemia   . Dyspnea   . GERD (gastroesophageal reflux disease)   . Hyperlipidemia   . Hypertension   . Peripheral vascular disease [facility])     Past Surgical History:  Procedure Laterality Date  . ABDOMINAL AORTOGRAM W/LOWER EXTREMITY Left 12/27/2017   Procedure: ABDOMINAL AORTOGRAM W/LOWER EXTREMITY;  Surgeon: Algernon Huxley, MD;  Location: [place] CV LAB;  Service: Cardiovascular;  Laterality: Left;  . ABDOMINAL HYSTERECTOMY    . AMPUTATION TOE Left 12/23/2017   Procedure: AMPUTATION TOE LEFT GREAT TOE;  Surgeon: Sharlotte Alamo, DPM;  Location: [facility] ORS;  Service: Podiatry;  Laterality: Left;  . ENDARTERECTOMY FEMORAL Left 09/08/2017   Procedure: ENDARTERECTOMY FEMORAL WITH CORMATRIX PATCH;  Surgeon: Algernon Huxley, MD;  Location: [facility] ORS;  Service: Vascular;  Laterality: Left;  . EYE SURGERY Bilateral    Cataract Extraction with IOL  . LOWER EXTREMITY ANGIOGRAPHY Left 07/22/2017   Procedure: Lower Extremity Angiography;  Surgeon: Algernon Huxley, MD;  Location: [place] CV LAB;  Service: Cardiovascular;  Laterality: Left;  . LOWER EXTREMITY ANGIOGRAPHY Left 08/16/2017   Procedure: Lower Extremity Angiography;  Surgeon: Algernon Huxley, MD;  Location: [place] CV LAB;  Service: Cardiovascular;  Laterality: Left;  Marland Kitchen MULTIPLE TOOTH EXTRACTIONS      FAMHx:  Family History  Problem Relation Age of Onset  . Hypertension Father     SOCHx:   reports that she has never smoked. She has never used smokeless tobacco. She reports that she does not  drink alcohol or use drugs.  ALLERGIES:  No Known Allergies  ROS: Pertinent items noted in HPI and remainder of comprehensive ROS otherwise negative.  HOME MEDS: Current Outpatient Medications on File Prior to Visit  Medication Sig Dispense Refill  . acetaminophen (TYLENOL) 500 MG tablet Take 1 tablet (500 mg total) by mouth daily.    Marland Kitchen aspirin 81 MG chewable tablet Chew 81 mg by mouth daily.    Marland Kitchen atorvastatin (LIPITOR) 10 MG tablet Take 1 tablet (10 mg total) by mouth daily. 30 tablet 1  . Blood Glucose Monitoring Suppl (ACCU-CHEK GUIDE) w/Device KIT See admin instructions.  0  . carvedilol (COREG) 3.125 MG tablet Take 1 tablet (3.125 mg total) by mouth 2 (two) times daily with a meal. 60 tablet 0  . clopidogrel (PLAVIX) 75 MG tablet Take 1 tablet (75 mg total) by mouth daily with breakfast. 30 tablet 2  . furosemide (LASIX) 40 MG tablet Take 2 tablets (80 mg total) by mouth 2 (two) times daily. 120 tablet 0  . glipiZIDE (GLUCOTROL) 5 MG tablet Take 0.5 tablets (2.5 mg total) by mouth daily before breakfast. After you have your lunch.  Do not take if your blood sugars are consistently running below 150. 60 tablet 2  . hydrOXYzine (ATARAX/VISTARIL) 10 MG tablet Take 1 tablet (10 mg total) by mouth 3 (three) times daily as needed for anxiety. (Patient taking differently: Take 10 mg by mouth 3 (three) times daily. ) 30 tablet 0  . Multiple Minerals-Vitamins (CALCIUM-MAGNESIUM-ZINC-D3) TABS Take 1 tablet by mouth daily.    . pantoprazole (PROTONIX) 20 MG tablet Take 1 tablet (20 mg total) by mouth daily. 30 tablet 1  . polyethylene glycol (MIRALAX / GLYCOLAX) packet Take 17 g by mouth daily as needed (for constipation.).     Marland Kitchen spironolactone (ALDACTONE) 25 MG tablet Take 1 tablet (25 mg total) by mouth daily. 30 tablet 0  . Cholecalciferol (VITAMIN D3) 50 MCG (2000 UT) capsule Take 2,000 Units by mouth daily.     No current facility-administered medications on file prior to visit.      LABS/IMAGING: No results found for this or any previous visit (from the past 48 hour(s)). No results found.  LIPID PANEL:    Component Value Date/Time   CHOL 121 09/21/2018 0356   TRIG 91 09/21/2018 0356   HDL 43 09/21/2018 0356   CHOLHDL 2.8 09/21/2018 0356   VLDL 18 09/21/2018 0356   LDLCALC 60 09/21/2018 0356     WEIGHTS: Wt Readings from Last 3 Encounters:  12/15/18 108 lb 9.6 oz (49.3 kg)  11/19/18 120 lb (54.4 kg)  11/07/18 120 lb (54.4 kg)    VITALS: BP 104/70   Pulse 78   Ht 5' 5.5" (1.664 m)   Wt 108 lb 9.6 oz (49.3 kg)   BMI 17.80 kg/m   EXAM: General appearance: Somnolent, thin, in no distress, in wheelchair Neck: no carotid bruit, no JVD and thyroid not enlarged, symmetric, no tenderness/mass/nodules Lungs: clear to  auscultation bilaterally Heart: regular rate and rhythm Abdomen: soft, non-tender; bowel sounds normal; no masses,  no organomegaly Extremities: extremities normal, atraumatic, no cyanosis or edema Pulses: 2+ and symmetric Skin: Skin color, texture, turgor normal. No rashes or lesions Neurologic: Grossly normal : Pleasant  EKG: Deferred  ASSESSMENT: 1. Chronic systolic congestive heart failure, NYHA class III symptoms, LVEF 30% 2. Cardiomyopathy - suspect coronary disease versus Takatsubo cardiomyopathy 3. CKD 3/4 4. Recent stroke  PLAN: 1.   Ms. Krupinski has been hospitalized several times since I saw her in the fall for acute on chronic systolic congestive heart failure as well as stroke.  Fortunately her weight is been fairly stable on her current dose of diuretic.  She denies any worsening shortness of breath or chest pain.  Renal function would not allow further invasive work-up.  We will continue to manage this medically.  No changes were made her medicines today.  We will plan to follow-up with me in 6 months or sooner as necessary.  Pixie Casino, MD, [facility], [place] Director of the  Advanced Lipid Disorders &  Cardiovascular Risk Reduction Clinic Diplomate of the American Board of Clinical Lipidology Attending Cardiologist  Direct Dial: [phone]  Fax: [phone]  Website:  [email or web address] 12/15/2018, 4:50 PM

## 2018-12-15 NOTE — Patient Instructions (Signed)
Medication Instructions:  Continue current medications If you need a refill on your cardiac medications before your next appointment, please call your pharmacy.   Follow-Up: At [facility], you and your health needs are our priority.  As part of our continuing mission to provide you with exceptional heart care, we have created designated Provider Care Teams.  These Care Teams include your primary Cardiologist (physician) and Advanced Practice Providers (APPs -  Physician Assistants and Nurse Practitioners) who all work together to provide you with the care you need, when you need it. You will need a follow up appointment in 6 months.  Please call our office 2 months in advance to schedule this appointment.  You may see Kenneth C Hilty, MD or one of the following Advanced Practice Providers on your designated Care Team: Hao Meng, PA-C . Angela Duke, PA-C  Any Other Special Instructions Will Be Listed Below (If Applicable).    

## 2018-12-16 DIAGNOSIS — Z7902 Long term (current) use of antithrombotics/antiplatelets: Secondary | ICD-10-CM | POA: Diagnosis not present

## 2018-12-16 DIAGNOSIS — M1990 Unspecified osteoarthritis, unspecified site: Secondary | ICD-10-CM | POA: Diagnosis not present

## 2018-12-16 DIAGNOSIS — E1122 Type 2 diabetes mellitus with diabetic chronic kidney disease: Secondary | ICD-10-CM | POA: Diagnosis not present

## 2018-12-16 DIAGNOSIS — Z89412 Acquired absence of left great toe: Secondary | ICD-10-CM | POA: Diagnosis not present

## 2018-12-16 DIAGNOSIS — I5042 Chronic combined systolic (congestive) and diastolic (congestive) heart failure: Secondary | ICD-10-CM | POA: Diagnosis not present

## 2018-12-16 DIAGNOSIS — I700 Atherosclerosis of aorta: Secondary | ICD-10-CM | POA: Diagnosis not present

## 2018-12-16 DIAGNOSIS — I2510 Atherosclerotic heart disease of native coronary artery without angina pectoris: Secondary | ICD-10-CM | POA: Diagnosis not present

## 2018-12-16 DIAGNOSIS — J9611 Chronic respiratory failure with hypoxia: Secondary | ICD-10-CM | POA: Diagnosis not present

## 2018-12-16 DIAGNOSIS — Z7982 Long term (current) use of aspirin: Secondary | ICD-10-CM | POA: Diagnosis not present

## 2018-12-16 DIAGNOSIS — I130 Hypertensive heart and chronic kidney disease with heart failure and stage 1 through stage 4 chronic kidney disease, or unspecified chronic kidney disease: Secondary | ICD-10-CM | POA: Diagnosis not present

## 2018-12-16 DIAGNOSIS — E785 Hyperlipidemia, unspecified: Secondary | ICD-10-CM | POA: Diagnosis not present

## 2018-12-16 DIAGNOSIS — N184 Chronic kidney disease, stage 4 (severe): Secondary | ICD-10-CM | POA: Diagnosis not present

## 2018-12-16 DIAGNOSIS — K219 Gastro-esophageal reflux disease without esophagitis: Secondary | ICD-10-CM | POA: Diagnosis not present

## 2018-12-16 DIAGNOSIS — E43 Unspecified severe protein-calorie malnutrition: Secondary | ICD-10-CM | POA: Diagnosis not present

## 2018-12-16 DIAGNOSIS — D631 Anemia in chronic kidney disease: Secondary | ICD-10-CM | POA: Diagnosis not present

## 2018-12-16 DIAGNOSIS — E1151 Type 2 diabetes mellitus with diabetic peripheral angiopathy without gangrene: Secondary | ICD-10-CM | POA: Diagnosis not present

## 2018-12-16 DIAGNOSIS — Z7984 Long term (current) use of oral hypoglycemic drugs: Secondary | ICD-10-CM | POA: Diagnosis not present

## 2018-12-18 DIAGNOSIS — I5023 Acute on chronic systolic (congestive) heart failure: Secondary | ICD-10-CM | POA: Diagnosis not present

## 2018-12-18 DIAGNOSIS — L97222 Non-pressure chronic ulcer of left calf with fat layer exposed: Secondary | ICD-10-CM | POA: Diagnosis not present

## 2018-12-20 DIAGNOSIS — K219 Gastro-esophageal reflux disease without esophagitis: Secondary | ICD-10-CM | POA: Diagnosis not present

## 2018-12-20 DIAGNOSIS — Z7982 Long term (current) use of aspirin: Secondary | ICD-10-CM | POA: Diagnosis not present

## 2018-12-20 DIAGNOSIS — J9611 Chronic respiratory failure with hypoxia: Secondary | ICD-10-CM | POA: Diagnosis not present

## 2018-12-20 DIAGNOSIS — D631 Anemia in chronic kidney disease: Secondary | ICD-10-CM | POA: Diagnosis not present

## 2018-12-20 DIAGNOSIS — Z7984 Long term (current) use of oral hypoglycemic drugs: Secondary | ICD-10-CM | POA: Diagnosis not present

## 2018-12-20 DIAGNOSIS — I5042 Chronic combined systolic (congestive) and diastolic (congestive) heart failure: Secondary | ICD-10-CM | POA: Diagnosis not present

## 2018-12-20 DIAGNOSIS — E1151 Type 2 diabetes mellitus with diabetic peripheral angiopathy without gangrene: Secondary | ICD-10-CM | POA: Diagnosis not present

## 2018-12-20 DIAGNOSIS — N184 Chronic kidney disease, stage 4 (severe): Secondary | ICD-10-CM | POA: Diagnosis not present

## 2018-12-20 DIAGNOSIS — I2510 Atherosclerotic heart disease of native coronary artery without angina pectoris: Secondary | ICD-10-CM | POA: Diagnosis not present

## 2018-12-20 DIAGNOSIS — M1990 Unspecified osteoarthritis, unspecified site: Secondary | ICD-10-CM | POA: Diagnosis not present

## 2018-12-20 DIAGNOSIS — I130 Hypertensive heart and chronic kidney disease with heart failure and stage 1 through stage 4 chronic kidney disease, or unspecified chronic kidney disease: Secondary | ICD-10-CM | POA: Diagnosis not present

## 2018-12-20 DIAGNOSIS — Z7902 Long term (current) use of antithrombotics/antiplatelets: Secondary | ICD-10-CM | POA: Diagnosis not present

## 2018-12-20 DIAGNOSIS — E785 Hyperlipidemia, unspecified: Secondary | ICD-10-CM | POA: Diagnosis not present

## 2018-12-20 DIAGNOSIS — E1122 Type 2 diabetes mellitus with diabetic chronic kidney disease: Secondary | ICD-10-CM | POA: Diagnosis not present

## 2018-12-20 DIAGNOSIS — I700 Atherosclerosis of aorta: Secondary | ICD-10-CM | POA: Diagnosis not present

## 2018-12-20 DIAGNOSIS — E43 Unspecified severe protein-calorie malnutrition: Secondary | ICD-10-CM | POA: Diagnosis not present

## 2018-12-20 DIAGNOSIS — Z89412 Acquired absence of left great toe: Secondary | ICD-10-CM | POA: Diagnosis not present

## 2018-12-26 ENCOUNTER — Other Ambulatory Visit: Payer: Self-pay

## 2018-12-26 DIAGNOSIS — E1151 Type 2 diabetes mellitus with diabetic peripheral angiopathy without gangrene: Secondary | ICD-10-CM | POA: Diagnosis not present

## 2018-12-26 DIAGNOSIS — E43 Unspecified severe protein-calorie malnutrition: Secondary | ICD-10-CM | POA: Diagnosis not present

## 2018-12-26 DIAGNOSIS — I2510 Atherosclerotic heart disease of native coronary artery without angina pectoris: Secondary | ICD-10-CM | POA: Diagnosis not present

## 2018-12-26 DIAGNOSIS — M1990 Unspecified osteoarthritis, unspecified site: Secondary | ICD-10-CM | POA: Diagnosis not present

## 2018-12-26 DIAGNOSIS — K219 Gastro-esophageal reflux disease without esophagitis: Secondary | ICD-10-CM | POA: Diagnosis not present

## 2018-12-26 DIAGNOSIS — D631 Anemia in chronic kidney disease: Secondary | ICD-10-CM | POA: Diagnosis not present

## 2018-12-26 DIAGNOSIS — I5042 Chronic combined systolic (congestive) and diastolic (congestive) heart failure: Secondary | ICD-10-CM | POA: Diagnosis not present

## 2018-12-26 DIAGNOSIS — Z7982 Long term (current) use of aspirin: Secondary | ICD-10-CM | POA: Diagnosis not present

## 2018-12-26 DIAGNOSIS — N184 Chronic kidney disease, stage 4 (severe): Secondary | ICD-10-CM | POA: Diagnosis not present

## 2018-12-26 DIAGNOSIS — J9611 Chronic respiratory failure with hypoxia: Secondary | ICD-10-CM | POA: Diagnosis not present

## 2018-12-26 DIAGNOSIS — I130 Hypertensive heart and chronic kidney disease with heart failure and stage 1 through stage 4 chronic kidney disease, or unspecified chronic kidney disease: Secondary | ICD-10-CM | POA: Diagnosis not present

## 2018-12-26 DIAGNOSIS — Z7902 Long term (current) use of antithrombotics/antiplatelets: Secondary | ICD-10-CM | POA: Diagnosis not present

## 2018-12-26 DIAGNOSIS — I700 Atherosclerosis of aorta: Secondary | ICD-10-CM | POA: Diagnosis not present

## 2018-12-26 DIAGNOSIS — E1122 Type 2 diabetes mellitus with diabetic chronic kidney disease: Secondary | ICD-10-CM | POA: Diagnosis not present

## 2018-12-26 DIAGNOSIS — Z7984 Long term (current) use of oral hypoglycemic drugs: Secondary | ICD-10-CM | POA: Diagnosis not present

## 2018-12-26 DIAGNOSIS — Z89412 Acquired absence of left great toe: Secondary | ICD-10-CM | POA: Diagnosis not present

## 2018-12-26 DIAGNOSIS — E785 Hyperlipidemia, unspecified: Secondary | ICD-10-CM | POA: Diagnosis not present

## 2018-12-26 NOTE — Patient Outreach (Signed)
[place] [facility]) Care Management  12/26/2018  SANDRA BRENTS 10-29-1926 [id number]   BSW messaged [name] Hopkins from [employer] regarding status of referral for home modifications submitted on 11/14/18.  BSW has not received response from message that was sent on 12/13/18.   BSW will follow up with patient when response is received.    Ronn Melena, BSW Social Worker [phone]

## 2018-12-27 DIAGNOSIS — L851 Acquired keratosis [keratoderma] palmaris et plantaris: Secondary | ICD-10-CM | POA: Diagnosis not present

## 2018-12-27 DIAGNOSIS — B351 Tinea unguium: Secondary | ICD-10-CM | POA: Diagnosis not present

## 2018-12-27 DIAGNOSIS — I739 Peripheral vascular disease, unspecified: Secondary | ICD-10-CM | POA: Diagnosis not present

## 2018-12-27 DIAGNOSIS — E1140 Type 2 diabetes mellitus with diabetic neuropathy, unspecified: Secondary | ICD-10-CM | POA: Diagnosis not present

## 2018-12-30 ENCOUNTER — Other Ambulatory Visit: Payer: Self-pay

## 2018-12-30 DIAGNOSIS — Z7902 Long term (current) use of antithrombotics/antiplatelets: Secondary | ICD-10-CM | POA: Diagnosis not present

## 2018-12-30 DIAGNOSIS — E785 Hyperlipidemia, unspecified: Secondary | ICD-10-CM | POA: Diagnosis not present

## 2018-12-30 DIAGNOSIS — I130 Hypertensive heart and chronic kidney disease with heart failure and stage 1 through stage 4 chronic kidney disease, or unspecified chronic kidney disease: Secondary | ICD-10-CM | POA: Diagnosis not present

## 2018-12-30 DIAGNOSIS — I700 Atherosclerosis of aorta: Secondary | ICD-10-CM | POA: Diagnosis not present

## 2018-12-30 DIAGNOSIS — J9611 Chronic respiratory failure with hypoxia: Secondary | ICD-10-CM | POA: Diagnosis not present

## 2018-12-30 DIAGNOSIS — I2510 Atherosclerotic heart disease of native coronary artery without angina pectoris: Secondary | ICD-10-CM | POA: Diagnosis not present

## 2018-12-30 DIAGNOSIS — D631 Anemia in chronic kidney disease: Secondary | ICD-10-CM | POA: Diagnosis not present

## 2018-12-30 DIAGNOSIS — I5042 Chronic combined systolic (congestive) and diastolic (congestive) heart failure: Secondary | ICD-10-CM | POA: Diagnosis not present

## 2018-12-30 DIAGNOSIS — M1990 Unspecified osteoarthritis, unspecified site: Secondary | ICD-10-CM | POA: Diagnosis not present

## 2018-12-30 DIAGNOSIS — E1122 Type 2 diabetes mellitus with diabetic chronic kidney disease: Secondary | ICD-10-CM | POA: Diagnosis not present

## 2018-12-30 DIAGNOSIS — E1151 Type 2 diabetes mellitus with diabetic peripheral angiopathy without gangrene: Secondary | ICD-10-CM | POA: Diagnosis not present

## 2018-12-30 DIAGNOSIS — N184 Chronic kidney disease, stage 4 (severe): Secondary | ICD-10-CM | POA: Diagnosis not present

## 2018-12-30 DIAGNOSIS — E43 Unspecified severe protein-calorie malnutrition: Secondary | ICD-10-CM | POA: Diagnosis not present

## 2018-12-30 DIAGNOSIS — Z89412 Acquired absence of left great toe: Secondary | ICD-10-CM | POA: Diagnosis not present

## 2018-12-30 DIAGNOSIS — Z7984 Long term (current) use of oral hypoglycemic drugs: Secondary | ICD-10-CM | POA: Diagnosis not present

## 2018-12-30 DIAGNOSIS — Z7982 Long term (current) use of aspirin: Secondary | ICD-10-CM | POA: Diagnosis not present

## 2018-12-30 DIAGNOSIS — K219 Gastro-esophageal reflux disease without esophagitis: Secondary | ICD-10-CM | POA: Diagnosis not present

## 2018-12-30 NOTE — Patient Outreach (Signed)
[place] [facility]) Care Management  12/30/2018  [name] 24-May-1926 [id number]   BSW messaged [name] Hopkins from [employer] on 12/13/18 and 12/26/18 regarding status of referral for home modifications submitted on 11/14/18.   BSW talked with [place] today. She reported that she did not receive referral via [employer] [place] platform although it is showing as "accepted".  She requested that BSW send referral information via secure email.   BSW received confirmation from [facility] that this was received and she said that she would reach out to caregiver. BSW will follow up with caregiver within the next two weeks.    Ronn Melena, BSW Social Worker [phone]

## 2019-01-02 DIAGNOSIS — E876 Hypokalemia: Secondary | ICD-10-CM | POA: Diagnosis not present

## 2019-01-02 DIAGNOSIS — I739 Peripheral vascular disease, unspecified: Secondary | ICD-10-CM | POA: Diagnosis not present

## 2019-01-02 DIAGNOSIS — I5023 Acute on chronic systolic (congestive) heart failure: Secondary | ICD-10-CM | POA: Diagnosis not present

## 2019-01-02 DIAGNOSIS — E1122 Type 2 diabetes mellitus with diabetic chronic kidney disease: Secondary | ICD-10-CM | POA: Diagnosis not present

## 2019-01-02 DIAGNOSIS — H9190 Unspecified hearing loss, unspecified ear: Secondary | ICD-10-CM | POA: Diagnosis not present

## 2019-01-03 ENCOUNTER — Other Ambulatory Visit: Payer: Self-pay

## 2019-01-03 ENCOUNTER — Emergency Department (HOSPITAL_COMMUNITY): Payer: Medicare Other

## 2019-01-03 ENCOUNTER — Inpatient Hospital Stay (HOSPITAL_COMMUNITY)
Admission: EM | Admit: 2019-01-03 | Discharge: 2019-01-29 | DRG: 871 | Disposition: E | Payer: Medicare Other | Attending: Internal Medicine | Admitting: Internal Medicine

## 2019-01-03 ENCOUNTER — Encounter (HOSPITAL_COMMUNITY): Payer: Self-pay

## 2019-01-03 DIAGNOSIS — Z8249 Family history of ischemic heart disease and other diseases of the circulatory system: Secondary | ICD-10-CM | POA: Diagnosis not present

## 2019-01-03 DIAGNOSIS — Z7902 Long term (current) use of antithrombotics/antiplatelets: Secondary | ICD-10-CM | POA: Diagnosis not present

## 2019-01-03 DIAGNOSIS — I509 Heart failure, unspecified: Secondary | ICD-10-CM | POA: Diagnosis not present

## 2019-01-03 DIAGNOSIS — G9340 Encephalopathy, unspecified: Secondary | ICD-10-CM | POA: Diagnosis present

## 2019-01-03 DIAGNOSIS — Z7982 Long term (current) use of aspirin: Secondary | ICD-10-CM | POA: Diagnosis not present

## 2019-01-03 DIAGNOSIS — Z515 Encounter for palliative care: Secondary | ICD-10-CM | POA: Diagnosis present

## 2019-01-03 DIAGNOSIS — E785 Hyperlipidemia, unspecified: Secondary | ICD-10-CM | POA: Diagnosis present

## 2019-01-03 DIAGNOSIS — R4182 Altered mental status, unspecified: Secondary | ICD-10-CM | POA: Diagnosis present

## 2019-01-03 DIAGNOSIS — N184 Chronic kidney disease, stage 4 (severe): Secondary | ICD-10-CM | POA: Diagnosis present

## 2019-01-03 DIAGNOSIS — A419 Sepsis, unspecified organism: Secondary | ICD-10-CM | POA: Diagnosis present

## 2019-01-03 DIAGNOSIS — R404 Transient alteration of awareness: Secondary | ICD-10-CM | POA: Diagnosis not present

## 2019-01-03 DIAGNOSIS — Z66 Do not resuscitate: Secondary | ICD-10-CM | POA: Diagnosis present

## 2019-01-03 DIAGNOSIS — J9610 Chronic respiratory failure, unspecified whether with hypoxia or hypercapnia: Secondary | ICD-10-CM | POA: Diagnosis present

## 2019-01-03 DIAGNOSIS — Z7984 Long term (current) use of oral hypoglycemic drugs: Secondary | ICD-10-CM

## 2019-01-03 DIAGNOSIS — I5022 Chronic systolic (congestive) heart failure: Secondary | ICD-10-CM | POA: Diagnosis present

## 2019-01-03 DIAGNOSIS — R0682 Tachypnea, not elsewhere classified: Secondary | ICD-10-CM

## 2019-01-03 DIAGNOSIS — E1122 Type 2 diabetes mellitus with diabetic chronic kidney disease: Secondary | ICD-10-CM | POA: Diagnosis present

## 2019-01-03 DIAGNOSIS — I130 Hypertensive heart and chronic kidney disease with heart failure and stage 1 through stage 4 chronic kidney disease, or unspecified chronic kidney disease: Secondary | ICD-10-CM | POA: Diagnosis present

## 2019-01-03 DIAGNOSIS — E860 Dehydration: Secondary | ICD-10-CM | POA: Diagnosis present

## 2019-01-03 DIAGNOSIS — Z9981 Dependence on supplemental oxygen: Secondary | ICD-10-CM | POA: Diagnosis not present

## 2019-01-03 DIAGNOSIS — R6521 Severe sepsis with septic shock: Secondary | ICD-10-CM | POA: Diagnosis present

## 2019-01-03 DIAGNOSIS — R4702 Dysphasia: Secondary | ICD-10-CM | POA: Diagnosis present

## 2019-01-03 DIAGNOSIS — Z79899 Other long term (current) drug therapy: Secondary | ICD-10-CM | POA: Diagnosis not present

## 2019-01-03 DIAGNOSIS — E875 Hyperkalemia: Secondary | ICD-10-CM | POA: Diagnosis present

## 2019-01-03 DIAGNOSIS — R4020 Unspecified coma: Secondary | ICD-10-CM | POA: Diagnosis not present

## 2019-01-03 DIAGNOSIS — E1151 Type 2 diabetes mellitus with diabetic peripheral angiopathy without gangrene: Secondary | ICD-10-CM | POA: Diagnosis present

## 2019-01-03 DIAGNOSIS — N179 Acute kidney failure, unspecified: Secondary | ICD-10-CM | POA: Diagnosis present

## 2019-01-03 DIAGNOSIS — R001 Bradycardia, unspecified: Secondary | ICD-10-CM | POA: Diagnosis not present

## 2019-01-03 LAB — CBC
HCT: 30.9 % — ABNORMAL LOW (ref 36.0–46.0)
Hemoglobin: 9.2 g/dL — ABNORMAL LOW (ref 12.0–15.0)
MCH: 21.8 pg — ABNORMAL LOW (ref 26.0–34.0)
MCHC: 29.8 g/dL — ABNORMAL LOW (ref 30.0–36.0)
MCV: 73.2 fL — AB (ref 80.0–100.0)
Platelets: UNDETERMINED 10*3/uL (ref 150–400)
RBC: 4.22 MIL/uL (ref 3.87–5.11)
RDW: 22.9 % — ABNORMAL HIGH (ref 11.5–15.5)
WBC: 17.7 10*3/uL — ABNORMAL HIGH (ref 4.0–10.5)
nRBC: 0.2 % (ref 0.0–0.2)

## 2019-01-03 LAB — URINALYSIS, ROUTINE W REFLEX MICROSCOPIC
Bilirubin Urine: NEGATIVE
Glucose, UA: 50 mg/dL — AB
Ketones, ur: NEGATIVE mg/dL
Nitrite: NEGATIVE
PROTEIN: 30 mg/dL — AB
Specific Gravity, Urine: 1.021 (ref 1.005–1.030)
pH: 5 (ref 5.0–8.0)

## 2019-01-03 LAB — POCT I-STAT EG7
Acid-base deficit: 3 mmol/L — ABNORMAL HIGH (ref 0.0–2.0)
Bicarbonate: 20.9 mmol/L (ref 20.0–28.0)
Calcium, Ion: 1.01 mmol/L — ABNORMAL LOW (ref 1.15–1.40)
HCT: 32 % — ABNORMAL LOW (ref 36.0–46.0)
Hemoglobin: 10.9 g/dL — ABNORMAL LOW (ref 12.0–15.0)
O2 Saturation: 74 %
Potassium: 6.8 mmol/L (ref 3.5–5.1)
Sodium: 145 mmol/L (ref 135–145)
TCO2: 22 mmol/L (ref 22–32)
pCO2, Ven: 33.3 mmHg — ABNORMAL LOW (ref 44.0–60.0)
pH, Ven: 7.405 (ref 7.250–7.430)
pO2, Ven: 39 mmHg (ref 32.0–45.0)

## 2019-01-03 LAB — DIFFERENTIAL
Abs Immature Granulocytes: 0.44 K/uL — ABNORMAL HIGH (ref 0.00–0.07)
Basophils Absolute: 0 K/uL (ref 0.0–0.1)
Basophils Relative: 0 %
Eosinophils Absolute: 0 K/uL (ref 0.0–0.5)
Eosinophils Relative: 0 %
Immature Granulocytes: 3 %
Lymphocytes Relative: 5 %
Lymphs Abs: 0.8 K/uL (ref 0.7–4.0)
Monocytes Absolute: 1.3 K/uL — ABNORMAL HIGH (ref 0.1–1.0)
Monocytes Relative: 8 %
Neutro Abs: 15.1 K/uL — ABNORMAL HIGH (ref 1.7–7.7)
Neutrophils Relative %: 84 %

## 2019-01-03 LAB — GLUCOSE, CAPILLARY
GLUCOSE-CAPILLARY: 391 mg/dL — AB (ref 70–99)
Glucose-Capillary: 350 mg/dL — ABNORMAL HIGH (ref 70–99)

## 2019-01-03 LAB — CBG MONITORING, ED: Glucose-Capillary: 331 mg/dL — ABNORMAL HIGH (ref 70–99)

## 2019-01-03 LAB — I-STAT TROPONIN, ED: Troponin i, poc: 0.05 ng/mL (ref 0.00–0.08)

## 2019-01-03 LAB — BASIC METABOLIC PANEL
Anion gap: 15 (ref 5–15)
BUN: 91 mg/dL — ABNORMAL HIGH (ref 8–23)
CO2: 17 mmol/L — ABNORMAL LOW (ref 22–32)
CREATININE: 5.99 mg/dL — AB (ref 0.44–1.00)
Calcium: 8.7 mg/dL — ABNORMAL LOW (ref 8.9–10.3)
Chloride: 112 mmol/L — ABNORMAL HIGH (ref 98–111)
GFR calc non Af Amer: 6 mL/min — ABNORMAL LOW (ref >60)
GFR, EST AFRICAN AMERICAN: 7 mL/min — AB (ref >60)
Glucose, Bld: 343 mg/dL — ABNORMAL HIGH (ref 70–99)
Potassium: 6.2 mmol/L — ABNORMAL HIGH (ref 3.5–5.1)
Sodium: 144 mmol/L (ref 135–145)

## 2019-01-03 LAB — COMPREHENSIVE METABOLIC PANEL
ALT: 21 U/L (ref 0–44)
AST: 33 U/L (ref 15–41)
Albumin: 2.6 g/dL — ABNORMAL LOW (ref 3.5–5.0)
Alkaline Phosphatase: 70 U/L (ref 38–126)
Anion gap: 15 (ref 5–15)
BUN: 90 mg/dL — ABNORMAL HIGH (ref 8–23)
CO2: 17 mmol/L — ABNORMAL LOW (ref 22–32)
Calcium: 8.6 mg/dL — ABNORMAL LOW (ref 8.9–10.3)
Chloride: 113 mmol/L — ABNORMAL HIGH (ref 98–111)
Creatinine, Ser: 5.92 mg/dL — ABNORMAL HIGH (ref 0.44–1.00)
GFR calc Af Amer: 7 mL/min — ABNORMAL LOW (ref >60)
GFR calc non Af Amer: 6 mL/min — ABNORMAL LOW (ref >60)
Glucose, Bld: 277 mg/dL — ABNORMAL HIGH (ref 70–99)
Potassium: 7 mmol/L (ref 3.5–5.1)
Sodium: 145 mmol/L (ref 135–145)
Total Bilirubin: 0.9 mg/dL (ref 0.3–1.2)
Total Protein: 7.9 g/dL (ref 6.5–8.1)

## 2019-01-03 LAB — PROTIME-INR
INR: 1.56
Prothrombin Time: 18.5 seconds — ABNORMAL HIGH (ref 11.4–15.2)

## 2019-01-03 LAB — POTASSIUM: Potassium: 6.1 mmol/L — ABNORMAL HIGH (ref 3.5–5.1)

## 2019-01-03 LAB — APTT: aPTT: 37 seconds — ABNORMAL HIGH (ref 24–36)

## 2019-01-03 LAB — LACTIC ACID, PLASMA
Lactic Acid, Venous: 3.8 mmol/L (ref 0.5–1.9)
Lactic Acid, Venous: 3.4 mmol/L (ref 0.5–1.9)

## 2019-01-03 MED ORDER — POLYETHYLENE GLYCOL 3350 17 G PO PACK: 17.0000 g | PACK | Freq: Every day | ORAL | Status: DC | PRN

## 2019-01-03 MED ORDER — PIPERACILLIN-TAZOBACTAM 3.375 G IVPB 30 MIN
3.3750 g | Freq: Once | INTRAVENOUS | Status: AC
  Administered 2019-01-03: 3.375 g via INTRAVENOUS
  Filled 2019-01-03: qty 50

## 2019-01-03 MED ORDER — PANTOPRAZOLE SODIUM 20 MG PO TBEC
20.0000 mg | DELAYED_RELEASE_TABLET | Freq: Every day | ORAL | Status: DC
  Filled 2019-01-03: qty 1

## 2019-01-03 MED ORDER — SODIUM CHLORIDE 0.9 % IV SOLN: 1.0000 g | Freq: Once | INTRAVENOUS | Status: DC

## 2019-01-03 MED ORDER — DEXTROSE 50 % IV SOLN
1.0000 | Freq: Once | INTRAVENOUS | Status: AC
  Administered 2019-01-03: 50 mL via INTRAVENOUS
  Filled 2019-01-03: qty 50

## 2019-01-03 MED ORDER — SODIUM CHLORIDE 0.9% FLUSH: 3.0000 mL | Freq: Once | INTRAVENOUS | Status: DC

## 2019-01-03 MED ORDER — VANCOMYCIN HCL IN DEXTROSE 1-5 GM/200ML-% IV SOLN
1000.0000 mg | Freq: Once | INTRAVENOUS | Status: AC
  Administered 2019-01-03: 1000 mg via INTRAVENOUS
  Filled 2019-01-03: qty 200

## 2019-01-03 MED ORDER — PIPERACILLIN-TAZOBACTAM IN DEX 2-0.25 GM/50ML IV SOLN
2.2500 g | Freq: Three times a day (TID) | INTRAVENOUS | Status: DC
  Administered 2019-01-04: 2.25 g via INTRAVENOUS
  Filled 2019-01-03 (×2): qty 50

## 2019-01-03 MED ORDER — CARVEDILOL 3.125 MG PO TABS: 3.1250 mg | ORAL_TABLET | Freq: Two times a day (BID) | ORAL | Status: DC

## 2019-01-03 MED ORDER — INSULIN ASPART 100 UNIT/ML ~~LOC~~ SOLN
0.0000 [IU] | Freq: Every day | SUBCUTANEOUS | Status: DC
  Administered 2019-01-03: 4 [IU] via SUBCUTANEOUS

## 2019-01-03 MED ORDER — VANCOMYCIN VARIABLE DOSE PER UNSTABLE RENAL FUNCTION (PHARMACIST DOSING): Status: DC

## 2019-01-03 MED ORDER — ALBUTEROL SULFATE (2.5 MG/3ML) 0.083% IN NEBU
10.0000 mg | INHALATION_SOLUTION | Freq: Once | RESPIRATORY_TRACT | Status: AC
  Administered 2019-01-03: 10 mg via RESPIRATORY_TRACT
  Filled 2019-01-03: qty 12

## 2019-01-03 MED ORDER — INSULIN ASPART 100 UNIT/ML IV SOLN
5.0000 [IU] | Freq: Once | INTRAVENOUS | Status: AC
  Administered 2019-01-03: 5 [IU] via INTRAVENOUS

## 2019-01-03 MED ORDER — WITCH HAZEL-GLYCERIN EX PADS
MEDICATED_PAD | CUTANEOUS | Status: DC | PRN
  Filled 2019-01-03: qty 100

## 2019-01-03 MED ORDER — INSULIN ASPART 100 UNIT/ML ~~LOC~~ SOLN: 0.0000 [IU] | Freq: Three times a day (TID) | SUBCUTANEOUS | Status: DC

## 2019-01-03 MED ORDER — CALCIUM GLUCONATE 10 % IV SOLN
1.0000 g | Freq: Once | INTRAVENOUS | Status: AC
  Administered 2019-01-03: 1 g via INTRAVENOUS
  Filled 2019-01-03: qty 10

## 2019-01-03 MED ORDER — CLOPIDOGREL BISULFATE 75 MG PO TABS: 75.0000 mg | ORAL_TABLET | Freq: Every day | ORAL | Status: DC

## 2019-01-03 MED ORDER — ACETAMINOPHEN 500 MG PO TABS: 500.0000 mg | ORAL_TABLET | Freq: Every day | ORAL | Status: DC | PRN

## 2019-01-03 MED ORDER — CALCIUM GLUCONATE-NACL 1-0.675 GM/50ML-% IV SOLN
1.0000 g | Freq: Once | INTRAVENOUS | Status: AC
  Administered 2019-01-03: 1000 mg via INTRAVENOUS
  Filled 2019-01-03: qty 50

## 2019-01-03 MED ORDER — ATORVASTATIN CALCIUM 10 MG PO TABS: 10.0000 mg | ORAL_TABLET | Freq: Every day | ORAL | Status: DC

## 2019-01-03 MED ORDER — DEXTROSE 5 % IV SOLN
INTRAVENOUS | Status: DC
  Administered 2019-01-03: 23:00:00 via INTRAVENOUS

## 2019-01-03 MED ORDER — ACETAMINOPHEN 500 MG PO TABS: 500.0000 mg | ORAL_TABLET | Freq: Every day | ORAL | Status: DC

## 2019-01-03 MED ORDER — SODIUM CHLORIDE 0.9 % IV BOLUS
500.0000 mL | Freq: Once | INTRAVENOUS | Status: AC
  Administered 2019-01-03: 500 mL via INTRAVENOUS

## 2019-01-03 MED ORDER — SODIUM POLYSTYRENE SULFONATE 15 GM/60ML PO SUSP
30.0000 g | Freq: Once | ORAL | Status: DC
  Filled 2019-01-03: qty 120

## 2019-01-03 MED ORDER — HYDROXYZINE HCL 10 MG PO TABS: 10.0000 mg | ORAL_TABLET | Freq: Three times a day (TID) | ORAL | Status: DC | PRN

## 2019-01-03 MED ORDER — SODIUM BICARBONATE 8.4 % IV SOLN
Freq: Once | INTRAVENOUS | Status: AC
  Administered 2019-01-03: 16:00:00 via INTRAVENOUS
  Filled 2019-01-03 (×2): qty 100

## 2019-01-03 MED ORDER — ASPIRIN 81 MG PO CHEW: 81.0000 mg | CHEWABLE_TABLET | Freq: Every day | ORAL | Status: DC

## 2019-01-03 MED ORDER — INSULIN ASPART 100 UNIT/ML IV SOLN
10.0000 [IU] | Freq: Once | INTRAVENOUS | Status: AC
  Administered 2019-01-03: 10 [IU] via INTRAVENOUS

## 2019-01-03 MED ORDER — HEPARIN SODIUM (PORCINE) 5000 UNIT/ML IJ SOLN
5000.0000 [IU] | Freq: Three times a day (TID) | INTRAMUSCULAR | Status: DC
  Administered 2019-01-03 – 2019-01-04 (×2): 5000 [IU] via SUBCUTANEOUS
  Filled 2019-01-03 (×2): qty 1

## 2019-01-03 NOTE — ED Notes (Signed)
Pt in xray

## 2019-01-03 NOTE — ED Provider Notes (Signed)
[facility] EMERGENCY DEPARTMENT Provider Note   CSN: [id number] Arrival date & time: 01/04/2019  1344     History   Chief Complaint Chief Complaint  Patient presents with  . Altered Mental Status    HPI [name] is a 85 y.o. female with PMH SHF (EF 25%) presenting with AMS that started this morning after she ate breakfast. She lives with her son and is taken care of by her grand-daugher and other son who provided the history. They say she was normal last night and was at her baseline where she is talkative and walking around on her own. This morning she was difficult to wake up but ate breakfast and afterward stopped responding and interacting. Last night she did say she felt nauseous. She does respond to some questions and states she is not having any pain. She has not had any recent illnesses. They state she was recently started on potassium and normally takes low dose lasix. Her medications are managed by her family members.   The history is provided by a relative. The history is limited by the condition of the patient.  This patient is a level 5 caveat due to AMS.   Past Medical History:  Diagnosis Date  . ACS (acute coronary syndrome) ([place]) 08/2018  . Anemia    family unsure of this history  . Arthritis   . CHF (congestive heart failure) ([place])   . Chronic ulcer of calf (HCC)    Left  . Constipation   . Diabetes mellitus without complication ([place])   . Dyslipidemia   . Dyspnea   . GERD (gastroesophageal reflux disease)   . Hyperlipidemia   . Hypertension   . Peripheral vascular disease [facility])     Patient Active Problem List   Diagnosis Date Noted  . Protein-calorie malnutrition, severe 11/22/2018  . Sepsis ([place]) 11/20/2018  . AKI (acute kidney injury) ([place]) 11/20/2018  . Hyperkalemia 11/20/2018  . CAD (coronary artery disease) 10/15/2018  . Diabetes ([place]) 09/29/2018  . Elevated troponin   . Shortness of breath   . Hypothermia 09/20/2018    . CHF (congestive heart failure) ([place]) 09/16/2018  . Advanced care planning/counseling discussion   . Goals of care, counseling/discussion   . Acute on chronic systolic CHF (congestive heart failure) ([place]) 09/15/2018  . Palliative care by specialist   . DNR (do not resuscitate)   . Weakness generalized   . CKD (chronic kidney disease) stage 4, GFR 15-29 ml/min (HCC) 09/10/2018  . Anemia in chronic kidney disease 08/25/2018  . Gangrene of toe of left foot ([place]) 12/23/2017  . PAD (peripheral artery disease) ([place]) 09/21/2017  . Atherosclerosis of artery of extremity with ulceration ([place]) 09/08/2017  . Atherosclerosis of native arteries of the extremities with ulceration ([place]) 06/25/2017  . Stroke ([place]) 12/31/2013  . TIA (transient ischemic attack) 12/31/2013  . ARF (acute renal failure) ([place]) 12/31/2013  . Hypertension   . Dyslipidemia   . Constipation   . Anemia     Past Surgical History:  Procedure Laterality Date  . ABDOMINAL AORTOGRAM W/LOWER EXTREMITY Left 12/27/2017   Procedure: ABDOMINAL AORTOGRAM W/LOWER EXTREMITY;  Surgeon: Algernon Huxley, MD;  Location: [place] CV LAB;  Service: Cardiovascular;  Laterality: Left;  . ABDOMINAL HYSTERECTOMY    . AMPUTATION TOE Left 12/23/2017   Procedure: AMPUTATION TOE LEFT GREAT TOE;  Surgeon: Sharlotte Alamo, DPM;  Location: [facility] ORS;  Service: Podiatry;  Laterality: Left;  . ENDARTERECTOMY FEMORAL Left 09/08/2017  Procedure: ENDARTERECTOMY FEMORAL WITH CORMATRIX PATCH;  Surgeon: Algernon Huxley, MD;  Location: [facility] ORS;  Service: Vascular;  Laterality: Left;  . EYE SURGERY Bilateral    Cataract Extraction with IOL  . LOWER EXTREMITY ANGIOGRAPHY Left 07/22/2017   Procedure: Lower Extremity Angiography;  Surgeon: Algernon Huxley, MD;  Location: [place] CV LAB;  Service: Cardiovascular;  Laterality: Left;  . LOWER EXTREMITY ANGIOGRAPHY Left 08/16/2017   Procedure: Lower Extremity Angiography;  Surgeon: Algernon Huxley, MD;  Location: [place] CV LAB;  Service: Cardiovascular;  Laterality: Left;  Marland Kitchen MULTIPLE TOOTH EXTRACTIONS       OB History   No obstetric history on file.      Home Medications    Prior to Admission medications   Medication Sig Start Date End Date Taking? Authorizing Provider  acetaminophen (TYLENOL) 500 MG tablet Take 1 tablet (500 mg total) by mouth daily. 10/18/18   Hongalgi, Lenis Dickinson, MD  aspirin 81 MG chewable tablet Chew 81 mg by mouth daily.    [provider]  atorvastatin (LIPITOR) 10 MG tablet Take 1 tablet (10 mg total) by mouth daily. 09/22/18   Hosie Poisson, MD  Blood Glucose Monitoring Suppl (ACCU-CHEK GUIDE) w/Device KIT See admin instructions. 12/28/17   [provider]  carvedilol (COREG) 3.125 MG tablet Take 1 tablet (3.125 mg total) by mouth 2 (two) times daily with a meal. 11/22/18   Thurnell Lose, MD  Cholecalciferol (VITAMIN D3) 50 MCG (2000 UT) capsule Take 2,000 Units by mouth daily.    [provider]  clopidogrel (PLAVIX) 75 MG tablet Take 1 tablet (75 mg total) by mouth daily with breakfast. 09/13/18   Denton Brick, Courage, MD  furosemide (LASIX) 40 MG tablet Take 2 tablets (80 mg total) by mouth 2 (two) times daily. 11/22/18 12/22/18  Thurnell Lose, MD  glipiZIDE (GLUCOTROL) 5 MG tablet Take 0.5 tablets (2.5 mg total) by mouth daily before breakfast. After you have your lunch.  Do not take if your blood sugars are consistently running below 150. 11/22/18   Thurnell Lose, MD  hydrOXYzine (ATARAX/VISTARIL) 10 MG tablet Take 1 tablet (10 mg total) by mouth 3 (three) times daily as needed for anxiety. Patient taking differently: Take 10 mg by mouth 3 (three) times daily.  09/22/18   Hosie Poisson, MD  Multiple Minerals-Vitamins (CALCIUM-MAGNESIUM-ZINC-D3) TABS Take 1 tablet by mouth daily.    [provider]  pantoprazole (PROTONIX) 20 MG tablet Take 1 tablet (20 mg total) by mouth daily. 07/13/14   Billy Fischer, MD  polyethylene glycol  (MIRALAX / [name]) packet Take 17 g by mouth daily as needed (for constipation.).     [provider]  spironolactone (ALDACTONE) 25 MG tablet Take 1 tablet (25 mg total) by mouth daily. 11/22/18 12/22/18  Thurnell Lose, MD    Family History Family History  Problem Relation Age of Onset  . Hypertension Father     Social History Social History   Tobacco Use  . Smoking status: Never Smoker  . Smokeless tobacco: Never Used  Substance Use Topics  . Alcohol use: No  . Drug use: No     Allergies   Patient has no known allergies.   Review of Systems Review of Systems Unable to obtain ROS due to AMS, unresponsive.   Physical Exam Updated Vital Signs BP 92/60 (BP Location: Right Arm)   Pulse (!) 55   Resp 16   SpO2 100%   Physical Exam  Constitution: thin, NAD, sitting up in bed, intermittently responsive HENT: AT/[place] Eyes: eom intact, no scleral icterus  Cardio: bradycardic, distant heart sounds, no m/r/g  Respiratory: CTA, no wheezing rales, or rhonchi  Abdominal: NTTP, soft, non-distended  MSK: no edema, is able to move all extremities, symmetrically weak Neuro: follows commands, not a&o, slight left sided facial weakness, unable to complete CN exam due to patient not following all commands  Skin: c/d/i    ED Treatments / Results  Labs (all labs ordered are listed, but only abnormal results are displayed) Labs Reviewed  PROTIME-INR - Abnormal; Notable for the following components:      Result Value   Prothrombin Time 18.5 (*)    All other components within normal limits  APTT - Abnormal; Notable for the following components:   aPTT 37 (*)    All other components within normal limits  CBC - Abnormal; Notable for the following components:   WBC 17.7 (*)    Hemoglobin 9.2 (*)    HCT 30.9 (*)    MCV 73.2 (*)    MCH 21.8 (*)    MCHC 29.8 (*)    RDW 22.9 (*)    All other components within normal limits  DIFFERENTIAL - Abnormal; Notable for the  following components:   Neutro Abs 15.1 (*)    Monocytes Absolute 1.3 (*)    Abs Immature Granulocytes 0.44 (*)    All other components within normal limits  COMPREHENSIVE METABOLIC PANEL - Abnormal; Notable for the following components:   Potassium 7.0 (*)    Chloride 113 (*)    CO2 17 (*)    Glucose, Bld 277 (*)    BUN 90 (*)    Creatinine, Ser 5.92 (*)    Calcium 8.6 (*)    Albumin 2.6 (*)    GFR calc non Af Amer 6 (*)    GFR calc Af Amer 7 (*)    All other components within normal limits  LACTIC ACID, PLASMA - Abnormal; Notable for the following components:   Lactic Acid, Venous 3.8 (*)    All other components within normal limits  LACTIC ACID, PLASMA  URINALYSIS, ROUTINE W REFLEX MICROSCOPIC  I-STAT TROPONIN, ED  CBG MONITORING, ED  I-STAT VENOUS BLOOD GAS, ED    EKG EKG Interpretation  Date/Time:  Tuesday January 03 2019 14:01:38 EST Ventricular Rate:  55 PR Interval:  156 QRS Duration: 110 QT Interval:  530 QTC Calculation: 507 R Axis:   102 Text Interpretation:  Sinus bradycardia Rightward axis Cannot rule out Anterior infarct , age undetermined T wave abnormality, consider lateral ischemia Abnormal ECG Confirmed by Quintella Reichert [phone]) on 01/12/2019 3:06:45 PM   Radiology Dg Chest 2 View  Result Date: 01/26/2019 CLINICAL DATA:  Altered mental status EXAM: CHEST - 2 VIEW COMPARISON:  11/21/2018 FINDINGS: Interval improvement in congestive heart failure with edema. Mild vascular congestion with small effusions which have improved. Mild bibasilar atelectasis which has improved. Atherosclerotic aortic arch. IMPRESSION: Interval improvement in congestive heart failure. Mild vascular congestion and small effusions. Electronically Signed   By: Franchot Gallo M.D.   On: 01/04/2019 14:58    Procedures Procedures (including critical care time)  Medications Ordered in ED Medications  sodium chloride flush (NS) 0.9 % injection 3 mL (has no administration in time  range)  sodium chloride 0.9 % bolus 500 mL (has no administration in time range)  calcium gluconate inj 10% (1 g) URGENT USE ONLY! (has no administration in time range)  albuterol (PROVENTIL) (2.5 MG/3ML) 0.083% nebulizer solution 10 mg (has no administration in time range)  insulin aspart (novoLOG) injection 5 Units (has no administration in time range)    And  dextrose 50 % solution 50 mL (has no administration in time range)  sodium bicarbonate 100 mEq in dextrose 5 % 1,000 mL infusion (has no administration in time range)     Initial Impression / Assessment and Plan / ED Course  I have reviewed the triage vital signs and the nursing notes.  Pertinent labs & imaging results that were available during my care of the patient were reviewed by me and considered in my medical decision making (see chart for details).  Clinical Course as of Jan 03 2258  Tue Jan 03, 2019  1524 Patient is hyperkalemic with some t-wave abnormality and bradycardia on EKG, no peaked T waves. This does look similar to previous EKGs but will give Ca gluconate for significantly elevated K. She is additionally septic and dehydrated with prerenal AKI on CKD, source of infection is unclear. Will start sepsis work up and will need admission. CT head pending. Will start antibiotics after obtaining blood cultures and UA.    [JS]  1735 CT head shows normal brain atrophy. AMS has improved somewhat with fluid rescuscitation and she is communicating a little more although still altered. UA pending but will need admission for hyperkalemia, hypovolemia and her AKI. Discussed with TRH who will see her for admission.    [JS]    Clinical Course User Index [JS] Marty Heck, DO    Final Clinical Impressions(s) / ED Diagnoses   Final diagnoses:  None    ED Discharge Orders    None       Kelcy Laible A, DO 01/18/2019 2259    Quintella Reichert, MD 01/06/19 1053

## 2019-01-03 NOTE — ED Notes (Signed)
Attempted report x1. 

## 2019-01-03 NOTE — ED Notes (Signed)
Placed pt on bairhugger due to temp of 92.0 rectal

## 2019-01-03 NOTE — ED Notes (Signed)
ED TO INPATIENT HANDOFF REPORT  Name/Age/Gender [name] 85 y.o. female  Code Status Code Status History    Date Active Date Inactive Code Status Order ID Comments User Context   11/20/2018 0045 11/22/2018 1656 Full Code [id number]  Shela Leff, MD ED   10/15/2018 1311 10/18/2018 1841 DNR [id number]  Janora Norlander, MD ED   09/20/2018 1029 09/22/2018 1656 DNR [id number]  Reubin Milan, MD ED   09/20/2018 1027 09/20/2018 1029 DNR [id number]  Reubin Milan, MD ED   09/15/2018 1447 09/17/2018 1947 DNR [id number]  Vaughan Basta, MD ED   09/11/2018 1025 09/13/2018 2049 DNR [id number]  Knox Royalty, NP Inpatient   09/09/2018 1414 09/11/2018 1025 Full Code [id number]  Merton Border, MD Inpatient   12/24/2017 0020 12/28/2017 1456 Full Code [id number]  Epifanio Lesches, MD Inpatient   12/23/2017 1542 12/24/2017 0020 Full Code [id number]  Epifanio Lesches, MD ED   09/08/2017 1226 09/10/2017 1635 Full Code [id number]  Algernon Huxley, MD Inpatient   08/16/2017 1452 08/16/2017 1920 Full Code [id number]  Algernon Huxley, MD Inpatient   07/22/2017 1107 07/22/2017 1610 Full Code [id number]  Algernon Huxley, MD Inpatient   12/31/2013 1623 01/01/2014 1923 Full Code [id number]  Murlean Iba, MD Inpatient      Home/SNF/Other Home  Chief Complaint not responding/ conversation   Level of Care/Admitting Diagnosis ED Disposition    ED Disposition Condition [place]: [facility] [[id number]]  Level of Care: Progressive [102]  Diagnosis: Sepsis [facility]) [[id number]]  Admitting Physician: Merton Border [[id number]]  Attending Physician: Laren Everts, ALI Marshal.Browner  Estimated length of stay: past midnight tomorrow  Certification:: I certify this patient will need inpatient services for at least 2 midnights  PT Class (Do Not Modify): Inpatient [101]  PT Acc Code (Do Not Modify): Private [1]       Medical History Past Medical History:  Diagnosis Date  . ACS  (acute coronary syndrome) ([place]) 08/2018  . Anemia    family unsure of this history  . Arthritis   . CHF (congestive heart failure) ([place])   . Chronic ulcer of calf (HCC)    Left  . Constipation   . Diabetes mellitus without complication ([place])   . Dyslipidemia   . Dyspnea   . GERD (gastroesophageal reflux disease)   . Hyperlipidemia   . Hypertension   . Peripheral vascular disease ([place])     Allergies No Known Allergies  IV Location/Drains/Wounds Patient Lines/Drains/Airways Status   Active Line/Drains/Airways    Name:   Placement date:   Placement time:   Site:   Days:   Peripheral IV 01/14/2019 Right;Anterior Forearm   01/27/2019    1558    Forearm   less than 1   Peripheral IV 01/12/2019 Right Hand   01/23/2019    1844    Hand   less than 1   Sheath 12/27/17 Right Arterial;Femoral   12/27/17    1520    Arterial;Femoral   372   External Urinary Catheter   10/15/18    1222    -   80   Incision (Closed) 09/08/17 Groin Left   09/08/17    0951     482   Incision (Closed) 12/23/17 Foot   12/23/17    2315     376   Wound / Incision (Open or Dehisced) 09/18/17 Diabetic ulcer;Other (Comment) Leg Left;Lower Oval wound 8 cm x 5 cm   09/18/17  0117    Leg   472   Wound / Incision (Open or Dehisced) 12/23/17 Diabetic ulcer Toe (Comment  which one) Left oval black wound bed to the bottom of the L great toe with black L great toe black   12/23/17    1730    Toe (Comment  which one)   376   Wound / Incision (Open or Dehisced) 12/23/17 Left chronic wound RLE anterior shin-dressing placed in ED   12/23/17    1730    -   376          Labs/Imaging Results for orders placed or performed during the hospital encounter of 01/01/2019 (from the past 48 hour(s))  Protime-INR     Status: Abnormal   Collection Time: 01/14/2019  1:58 PM  Result Value Ref Range   Prothrombin Time 18.5 (H) 11.4 - 15.2 seconds   INR 1.56     Comment: Performed at [facility], [place] [address], [place], [place] [address]   APTT     Status: Abnormal   Collection Time: 01/24/2019  1:58 PM  Result Value Ref Range   aPTT 37 (H) 24 - 36 seconds    Comment:        IF BASELINE aPTT IS ELEVATED, SUGGEST PATIENT RISK ASSESSMENT BE USED TO DETERMINE APPROPRIATE ANTICOAGULANT THERAPY. Performed at [facility], [place] [address], [place], [place] [address]   CBC     Status: Abnormal   Collection Time: 01/08/2019  1:58 PM  Result Value Ref Range   WBC 17.7 (H) 4.0 - 10.5 K/uL   RBC 4.22 3.87 - 5.11 MIL/uL   Hemoglobin 9.2 (L) 12.0 - 15.0 g/dL   HCT 30.9 (L) 36.0 - 46.0 %   MCV 73.2 (L) 80.0 - 100.0 fL   MCH 21.8 (L) 26.0 - 34.0 pg   MCHC 29.8 (L) 30.0 - 36.0 g/dL   RDW 22.9 (H) 11.5 - 15.5 %   Platelets PLATELET CLUMPS NOTED ON SMEAR, UNABLE TO ESTIMATE 150 - 400 K/uL    Comment: Immature Platelet Fraction may be clinically indicated, consider ordering this additional test [id number]    nRBC 0.2 0.0 - 0.2 %    Comment: Performed at [facility], [place] [address] [place], [place] [address]  Differential     Status: Abnormal   Collection Time: 01/07/2019  1:58 PM  Result Value Ref Range   Neutrophils Relative % 84 %   Neutro Abs 15.1 (H) 1.7 - 7.7 K/uL   Lymphocytes Relative 5 %   Lymphs Abs 0.8 0.7 - 4.0 K/uL   Monocytes Relative 8 %   Monocytes Absolute 1.3 (H) 0.1 - 1.0 K/uL   Eosinophils Relative 0 %   Eosinophils Absolute 0.0 0.0 - 0.5 K/uL   Basophils Relative 0 %   Basophils Absolute 0.0 0.0 - 0.1 K/uL   Immature Granulocytes 3 %   Abs Immature Granulocytes 0.44 (H) 0.00 - 0.07 K/uL   Tear Drop Cells PRESENT    Burr Cells PRESENT    Target Cells PRESENT     Comment: Performed at [facility], [place] [address] [place], [place] [address]  Comprehensive metabolic panel     Status: Abnormal   Collection Time: 01/21/2019  1:58 PM  Result Value Ref Range   Sodium 145 135 - 145 mmol/L   Potassium 7.0 (HH) 3.5 - 5.1 mmol/L    Comment: CRITICAL RESULT CALLED TO, READ BACK BY AND VERIFIED  WITH: GORE,M RN @ 3299 01/22/2019 LEONARD,A    Chloride 113 (H) 98 - 111 mmol/L   CO2 17 (L) 22 - 32 mmol/L   Glucose, Bld 277 (H) 70 - 99 mg/dL   BUN 90 (H) 8 - 23 mg/dL   Creatinine, Ser 5.92 (H) 0.44 - 1.00 mg/dL   Calcium 8.6 (L) 8.9 - 10.3 mg/dL   Total Protein 7.9 6.5 - 8.1 g/dL   Albumin 2.6 (L) 3.5 - 5.0 g/dL   AST 33 15 - 41 U/L   ALT 21 0 - 44 U/L   Alkaline Phosphatase 70 38 - 126 U/L   Total Bilirubin 0.9 0.3 - 1.2 mg/dL   GFR calc non Af Amer 6 (L) >60 mL/min   GFR calc Af Amer 7 (L) >60 mL/min   Anion gap 15 5 - 15    Comment: Performed at [facility] [address], [place], [place] [address]  Lactic acid, plasma     Status: Abnormal   Collection Time: 01/15/2019  1:58 PM  Result Value Ref Range   Lactic Acid, Venous 3.8 (HH) 0.5 - 1.9 mmol/L    Comment: CRITICAL RESULT CALLED TO, READ BACK BY AND VERIFIED WITH: MORRIS,T RN @ 3419 01/15/2019 LEONARD,A Performed at [facility], [place] [address], [place], [place] [address]   I-stat troponin, ED     Status: None   Collection Time: 01/20/2019  2:44 PM  Result Value Ref Range   Troponin i, poc 0.05 0.00 - 0.08 ng/mL   Comment 3            Comment: Due to the release kinetics of cTnI, a negative result within the first hours of the onset of symptoms does not rule out myocardial infarction with certainty. If myocardial infarction is still suspected, repeat the test at appropriate intervals.   Lactic acid, plasma     Status: Abnormal   Collection Time: 01/11/2019  3:45 PM  Result Value Ref Range   Lactic Acid, Venous 3.4 (HH) 0.5 - 1.9 mmol/L    Comment: CRITICAL RESULT CALLED TO, READ BACK BY AND VERIFIED WITH: GORE,M RN @ [phone] 01/15/2019 LEONARD,A Performed at [facility], [place] [address], [place], [place] [address]   POCT I-Stat EG7     Status: Abnormal   Collection Time: 01/06/2019  4:21 PM  Result Value Ref Range   pH, Ven 7.405 7.250 - 7.430   pCO2, Ven 33.3 (L) 44.0 - 60.0 mmHg   pO2, Ven 39.0 32.0 -  45.0 mmHg   Bicarbonate 20.9 20.0 - 28.0 mmol/L   TCO2 22 22 - 32 mmol/L   O2 Saturation 74.0 %   Acid-base deficit 3.0 (H) 0.0 - 2.0 mmol/L   Sodium 145 135 - 145 mmol/L   Potassium 6.8 (HH) 3.5 - 5.1 mmol/L   Calcium, Ion 1.01 (L) 1.15 - 1.40 mmol/L   HCT 32.0 (L) 36.0 - 46.0 %   Hemoglobin 10.9 (L) 12.0 - 15.0 g/dL   Patient temperature HIDE    Sample type VENOUS    Comment NOTIFIED PHYSICIAN   Basic metabolic panel     Status: Abnormal   Collection Time: 01/14/2019  5:30 PM  Result Value Ref Range   Sodium 144 135 - 145 mmol/L   Potassium 6.2 (H) 3.5 - 5.1 mmol/L   Chloride 112 (H) 98 - 111 mmol/L   CO2 17 (L) 22 - 32 mmol/L   Glucose, Bld 343 (H) 70 - 99  mg/dL   BUN 91 (H) 8 - 23 mg/dL   Creatinine, Ser 5.99 (H) 0.44 - 1.00 mg/dL   Calcium 8.7 (L) 8.9 - 10.3 mg/dL   GFR calc non Af Amer 6 (L) >60 mL/min   GFR calc Af Amer 7 (L) >60 mL/min   Anion gap 15 5 - 15    Comment: Performed at [facility] [address] [place], [place] [address]  Urinalysis, Routine w reflex microscopic     Status: Abnormal   Collection Time: 01/14/2019  6:19 PM  Result Value Ref Range   Color, Urine AMBER (A) YELLOW    Comment: BIOCHEMICALS MAY BE AFFECTED BY COLOR   APPearance CLOUDY (A) CLEAR   Specific Gravity, Urine 1.021 1.005 - 1.030   pH 5.0 5.0 - 8.0   Glucose, UA 50 (A) NEGATIVE mg/dL   Hgb urine dipstick MODERATE (A) NEGATIVE   Bilirubin Urine NEGATIVE NEGATIVE   Ketones, ur NEGATIVE NEGATIVE mg/dL   Protein, ur 30 (A) NEGATIVE mg/dL   Nitrite NEGATIVE NEGATIVE   Leukocytes, UA MODERATE (A) NEGATIVE   RBC / HPF 21-50 0 - 5 RBC/hpf   WBC, UA 11-20 0 - 5 WBC/hpf   Bacteria, UA RARE (A) NONE SEEN   Squamous Epithelial / LPF 0-5 0 - 5   Budding Yeast PRESENT    Hyaline Casts, UA PRESENT     Comment: Performed at [facility], [address] [place], [place] [address]  CBG monitoring, ED     Status: Abnormal   Collection Time: 01/09/2019  7:38 PM  Result Value Ref  Range   Glucose-Capillary 331 (H) 70 - 99 mg/dL   Dg Chest 2 View  Result Date: 01/19/2019 CLINICAL DATA:  Altered mental status EXAM: CHEST - 2 VIEW COMPARISON:  11/21/2018 FINDINGS: Interval improvement in congestive heart failure with edema. Mild vascular congestion with small effusions which have improved. Mild bibasilar atelectasis which has improved. Atherosclerotic aortic arch. IMPRESSION: Interval improvement in congestive heart failure. Mild vascular congestion and small effusions. Electronically Signed   By: Franchot Gallo M.D.   On: 01/17/2019 14:58   Ct Head Wo Contrast  Result Date: 01/24/2019 CLINICAL DATA:  Altered level of consciousness.  Possible stroke EXAM: CT HEAD WITHOUT CONTRAST TECHNIQUE: Contiguous axial images were obtained from the base of the skull through the vertex without intravenous contrast. COMPARISON:  MRI head 09/20/2018 FINDINGS: Brain: Moderate atrophy and moderate ventricular enlargement unchanged. Chronic white matter changes stable. Negative for acute infarct, hemorrhage, or mass. Vascular: Negative for hyperdense vessel Skull: Negative Sinuses/Orbits: Paranasal sinuses clear. Bilateral cataract surgery. No orbital mass. Other: None IMPRESSION: Atrophy and chronic microvascular ischemia in the white matter. No acute abnormality. Electronically Signed   By: Franchot Gallo M.D.   On: 01/23/2019 17:16    Pending Labs Unresulted Labs (From admission, onward)    Start     Ordered   01/04/2019 1528  Blood culture (routine x 2)  BLOOD CULTURE X 2,   STAT     01/19/2019 1527   Signed and Held  Potassium  Once-Timed,   R    Question:  Specimen collection method  Answer:  IV Team=IV Team collect   Signed and Held   Signed and Held  Basic metabolic panel  Tomorrow morning,   R    Question:  Specimen collection method  Answer:  IV Team=IV Team collect   Signed and Held  Vitals/Pain Today's Vitals   01/19/2019 1822 01/26/2019 1830 01/01/2019 1900 01/21/2019 1930   BP:  (!) 90/58 (!) 90/56 93/65  Pulse:  (!) 57 (!) 57 (!) 57  Resp:  (!) 21 (!) 23 (!) 31  Temp:      TempSrc:      SpO2:  99% 99% 99%  Weight: 47.6 kg     PainSc:        Isolation Precautions No active isolations  Medications Medications  sodium chloride flush (NS) 0.9 % injection 3 mL (has no administration in time range)  sodium chloride 0.9 % bolus 500 mL (500 mLs Intravenous New Bag/Given 01/21/2019 1941)  sodium bicarbonate 100 mEq in dextrose 5 % 1,000 mL infusion ( Intravenous New Bag/Given 01/19/2019 1605)  vancomycin (VANCOCIN) IVPB 1000 mg/200 mL premix (1,000 mg Intravenous New Bag/Given 01/24/2019 1940)  vancomycin variable dose per unstable renal function (pharmacist dosing) (has no administration in time range)  piperacillin-tazobactam (ZOSYN) IVPB 2.25 g (has no administration in time range)  calcium gluconate inj 10% (1 g) URGENT USE ONLY! (1 g Intravenous Given 01/12/2019 1610)  albuterol (PROVENTIL) (2.5 MG/3ML) 0.083% nebulizer solution 10 mg (10 mg Nebulization Given 01/24/2019 1557)  insulin aspart (novoLOG) injection 5 Units (5 Units Intravenous Given 01/07/2019 1559)    And  dextrose 50 % solution 50 mL (50 mLs Intravenous Given 01/06/2019 1558)  piperacillin-tazobactam (ZOSYN) IVPB 3.375 g (0 g Intravenous Stopped 01/20/2019 1923)    Mobility walks with device

## 2019-01-03 NOTE — ED Triage Notes (Signed)
Pt presents for altered mental status. Family came to house today to take to appointment and patient was not responding like normal. Pt is awake and alert but not interactive or walking like normal. Last seen last night but family states was a little altered then as well.

## 2019-01-03 NOTE — H&P (Addendum)
[facility]                                                                                    Patient Demographics  [name], is a 85 y.o. female  CSN: [id number]  MRN: [id number]  DOB - [birth year]  Admit Date - 01/24/2019  Outpatient Primary MD for the patient is Lucianne Lei, MD   With History of -  Past Medical History:  Diagnosis Date  . ACS (acute coronary syndrome) ([place]) 08/2018  . Anemia    family unsure of this history  . Arthritis   . CHF (congestive heart failure) ([place])   . Chronic ulcer of calf (HCC)    Left  . Constipation   . Diabetes mellitus without complication ([place])   . Dyslipidemia   . Dyspnea   . GERD (gastroesophageal reflux disease)   . Hyperlipidemia   . Hypertension   . Peripheral vascular disease [facility])       Past Surgical History:  Procedure Laterality Date  . ABDOMINAL AORTOGRAM W/LOWER EXTREMITY Left 12/27/2017   Procedure: ABDOMINAL AORTOGRAM W/LOWER EXTREMITY;  Surgeon: Algernon Huxley, MD;  Location: [name] [facility];  Service: Cardiovascular;  Laterality: Left;  . ABDOMINAL HYSTERECTOMY    . AMPUTATION TOE Left 12/23/2017   Procedure: AMPUTATION TOE LEFT GREAT TOE;  Surgeon: Sharlotte Alamo, DPM;  Location: [facility] ORS;  Service: Podiatry;  Laterality: Left;  . ENDARTERECTOMY FEMORAL Left 09/08/2017   Procedure: ENDARTERECTOMY FEMORAL WITH CORMATRIX PATCH;  Surgeon: Algernon Huxley, MD;  Location: [facility] OR[facility];  Service: Vascular;  Laterality: Left;  . EYE SURGERY Bilateral    Cataract Extraction with IOL  . LOWER EXTREMITY ANGIOGRAPHY Left 07/22/2017   Procedure: Lower Extremity Angiography;  Surgeon: Algernon Huxley, MD;  Location: [place] [facility];  Service: Cardiovascular;  Laterality: Left;  . LOWER EXTREMITY ANGIOGRAPHY Left 08/16/2017   Procedure: Lower Extremity Angiography;  Surgeon: Algernon Huxley, MD;  Location: [place] [facility];  Service: Cardiovascular;  Laterality: Left;  Marland Kitchen MULTIPLE TOOTH EXTRACTIONS      in for    Chief Complaint  Patient presents with  . Altered Mental Status     HPI  [name]  is a 85 y.o. female, with past medical history significant for congestive heart failure, diabetes mellitus and  dyslipidemia presenting with altered mental status .  She was getting ready to go to her dentist today and had breakfast when her son noticed that she was less responsive.  Family takes care of her medications and they said that she was started on potassium lately, p.o.  I tried to take history from the patient who was not able to provide it.  Most of the history was provided by the family at bedside    Review of Systems    Unable to obtain  Social History Social History   Tobacco Use  . Smoking status: Never Smoker  . Smokeless tobacco: Never Used  Substance Use Topics  . Alcohol use: No     Family History Family History  Problem Relation Age of Onset  . Hypertension Father      Prior to Admission  medications   Medication Sig Start Date End Date Taking? Authorizing Provider  acetaminophen (TYLENOL) 500 MG tablet Take 1 tablet (500 mg total) by mouth daily. 10/18/18   Hongalgi, Lenis Dickinson, MD  aspirin 81 MG chewable tablet Chew 81 mg by mouth daily.    [provider]  atorvastatin (LIPITOR) 10 MG tablet Take 1 tablet (10 mg total) by mouth daily. 09/22/18   Hosie Poisson, MD  Blood Glucose Monitoring Suppl (ACCU-CHEK GUIDE) w/Device KIT See admin instructions. 12/28/17   [provider]  carvedilol (COREG) 3.125 MG tablet Take 1 tablet (3.125 mg total) by mouth 2 (two) times daily with a meal. 11/22/18   Thurnell Lose, MD  Cholecalciferol (VITAMIN D3) 50 MCG (2000 UT) capsule Take 2,000 Units by mouth daily.    [provider]  clopidogrel (PLAVIX) 75 MG tablet Take 1 tablet (75 mg total) by mouth daily with breakfast. 09/13/18   Denton Brick, Courage, MD  furosemide (LASIX) 40 MG tablet Take 2 tablets (80 mg total) by mouth 2 (two) times daily.  11/22/18 12/22/18  Thurnell Lose, MD  glipiZIDE (GLUCOTROL) 5 MG tablet Take 0.5 tablets (2.5 mg total) by mouth daily before breakfast. After you have your lunch.  Do not take if your blood sugars are consistently running below 150. 11/22/18   Thurnell Lose, MD  hydrOXYzine (ATARAX/VISTARIL) 10 MG tablet Take 1 tablet (10 mg total) by mouth 3 (three) times daily as needed for anxiety. Patient taking differently: Take 10 mg by mouth 3 (three) times daily.  09/22/18   Hosie Poisson, MD  Multiple Minerals-Vitamins (CALCIUM-MAGNESIUM-ZINC-D3) TABS Take 1 tablet by mouth daily.    [provider]  pantoprazole (PROTONIX) 20 MG tablet Take 1 tablet (20 mg total) by mouth daily. 07/13/14   Billy Fischer, MD  polyethylene glycol (MIRALAX / [name]) packet Take 17 g by mouth daily as needed (for constipation.).     [provider]  spironolactone (ALDACTONE) 25 MG tablet Take 1 tablet (25 mg total) by mouth daily. 11/22/18 12/22/18  Thurnell Lose, MD    No Known Allergies  Physical Exam  Vitals  Blood pressure (!) 89/67, pulse (!) 55, resp. rate 13, SpO2 99 %.   1. General well-developed, well-nourished female in no acute distress  2.  Flat affect and insight, Not Suicidal or Homicidal, confused  3. No F.N deficits, grossly, patient moving all extremities.  4. Ears and Eyes appear Normal, Conjunctivae clear, PERRLA. Moist Oral Mucosa.  5. Supple Neck, No JVD, No cervical lymphadenopathy appriciated, No Carotid Bruits.  6. Symmetrical Chest wall movement, decreased breath sounds at the bases.  7. RRR, No Gallops, Rubs or Murmurs, No Parasternal Heave.  8. Positive Bowel Sounds, Abdomen Soft, Non tender, No organomegaly appriciated,No rebound -guarding or rigidity.  9.  No Cyanosis, Normal Skin Turgor, No Skin Rash or Bruise.  10. Good muscle tone,  joints appear normal , no effusions, Normal ROM.   Data Review  CBC Recent Labs  Lab 01/26/2019 1358  01/04/2019 1621  WBC 17.7*  --   HGB 9.2* 10.9*  HCT 30.9* 32.0*  PLT PLATELET CLUMPS NOTED ON SMEAR, UNABLE TO ESTIMATE  --   MCV 73.2*  --   MCH 21.8*  --   MCHC 29.8*  --   RDW 22.9*  --   LYMPHSABS 0.8  --   MONOABS 1.3*  --   EOSABS 0.0  --   BASOSABS 0.0  --    ------------------------------------------------------------------------------------------------------------------  Chemistries  Recent Labs  Lab 01/26/2019 1358 01/11/2019 1621  NA 145 145  K 7.0* 6.8*  CL 113*  --   CO2 17*  --   GLUCOSE 277*  --   BUN 90*  --   CREATININE 5.92*  --   CALCIUM 8.6*  --   AST 33  --   ALT 21  --   ALKPHOS 70  --   BILITOT 0.9  --    ------------------------------------------------------------------------------------------------------------------ CrCl cannot be calculated (Unknown ideal weight.). ------------------------------------------------------------------------------------------------------------------ No results for input(s): TSH, T4TOTAL, T3FREE, THYROIDAB in the last 72 hours.  Invalid input(s): FREET3   Coagulation profile Recent Labs  Lab 01/19/2019 1358  INR 1.56   ------------------------------------------------------------------------------------------------------------------- No results for input(s): DDIMER in the last 72 hours. -------------------------------------------------------------------------------------------------------------------  Cardiac Enzymes No results for input(s): CKMB, TROPONINI, MYOGLOBIN in the last 168 hours.  Invalid input(s): CK ------------------------------------------------------------------------------------------------------------------ Invalid input(s): POCBNP   ---------------------------------------------------------------------------------------------------------------  Urinalysis    Component Value Date/Time   COLORURINE YELLOW 11/19/2018 [place] 11/19/2018 2204   LABSPEC 1.020 11/19/2018 2204    PHURINE 5.5 11/19/2018 [place] 11/19/2018 [place] NEGATIVE 11/19/2018 2204   BILIRUBINUR NEGATIVE 11/19/2018 2204   KETONESUR NEGATIVE 11/19/2018 2204   PROTEINUR NEGATIVE 11/19/2018 2204   UROBILINOGEN 1.0 09/07/2016 1403   NITRITE NEGATIVE 11/19/2018 2204   LEUKOCYTESUR NEGATIVE 11/19/2018 2204    ----------------------------------------------------------------------------------------------------------------   Imaging results:   Dg Chest 2 View  Result Date: 01/20/2019 CLINICAL DATA:  Altered mental status EXAM: CHEST - 2 VIEW COMPARISON:  11/21/2018 FINDINGS: Interval improvement in congestive heart failure with edema. Mild vascular congestion with small effusions which have improved. Mild bibasilar atelectasis which has improved. Atherosclerotic aortic arch. IMPRESSION: Interval improvement in congestive heart failure. Mild vascular congestion and small effusions. Electronically Signed   By: Franchot Gallo M.D.   On: 01/01/2019 14:58   Ct Head Wo Contrast  Result Date: 01/06/2019 CLINICAL DATA:  Altered level of consciousness.  Possible stroke EXAM: CT HEAD WITHOUT CONTRAST TECHNIQUE: Contiguous axial images were obtained from the base of the skull through the vertex without intravenous contrast. COMPARISON:  MRI head 09/20/2018 FINDINGS: Brain: Moderate atrophy and moderate ventricular enlargement unchanged. Chronic white matter changes stable. Negative for acute infarct, hemorrhage, or mass. Vascular: Negative for hyperdense vessel Skull: Negative Sinuses/Orbits: Paranasal sinuses clear. Bilateral cataract surgery. No orbital mass. Other: None IMPRESSION: Atrophy and chronic microvascular ischemia in the white matter. No acute abnormality. Electronically Signed   By: Franchot Gallo M.D.   On: 01/07/2019 17:16    My personal review of EKG: Sinus bradycardia at 55 bpm, no acute changes  Assessment & Plan  Sepsis with leukocytosis and elevated lactic acid  level Slow hydration with D5 half-normal saline with bicarb IV vancomycin and Zosyn Check blood cultures x2  Hyperkalemia status post bicarb and D50 water with insulin Continue with bicarb drip Kayexalate Hold Aldactone Follow potassium level at 8 PM  Acute on chronic renal failure Hold Lasix Slow hydration  Dysphasia probably situational due to sepsis and altered mental status  DVT Prophylaxis Heparin  AM Labs Ordered, also please review Full Orders  Family Communication: Admission, patients condition and plan of care including tests being ordered have been discussed with the patient and sons who indicate understanding and agree with the plan and Code Status.  Code Status full  Disposition Plan: To be determined  Time spent in minutes : 48 minutes  Condition critical   @  [email or web address]@

## 2019-01-03 NOTE — ED Notes (Signed)
pts bed is being cleaned at the moment. RN hasnt taken report

## 2019-01-03 NOTE — ED Notes (Signed)
Critical Potassium of 7.0 called from main lab. MD notified

## 2019-01-03 NOTE — ED Notes (Signed)
Per MD Hijazi, Rocephin is to be discontinued and new antibiotics are ordered for the patient

## 2019-01-03 NOTE — Progress Notes (Signed)
Pharmacy Antibiotic Note  [name] is a 85 y.o. female admitted on 01/16/2019 with sepsis.  Pharmacy has been consulted for Zosyn and Vancomycin dosing. SCr 5.99, CrCl ~ 5 mL/min (SCr BL ~ 3-4)  Plan: Vancomycin 1000mg  IV x 1, then intermittent dosing Zosyn 3.375g IV x 1, then 2.25g IV every 8 hours Monitor renal function, Cx and ability to narrow Vancomycin level as needed for intermittent dosing     Temp (24hrs), Avg:92 F (33.3 C), Min:92 F (33.3 C), Max:92 F (33.3 C)  Recent Labs  Lab 01/22/2019 1358 01/11/2019 1545 01/06/2019 1730  WBC 17.7*  --   --   CREATININE 5.92*  --  5.99*  LATICACIDVEN 3.8* 3.4*  --     CrCl cannot be calculated (Unknown ideal weight.).    No Known Allergies  Antimicrobials this admission: Vanc 2/4>> Zosyn 2/4>> Ceftriaxone x1  Dose adjustments this admission: n/a  Microbiology results: 2/4 BCx: sent  Bertis Ruddy, PharmD Clinical Pharmacist Please check AMION for all [place] numbers 01/14/2019 6:22 PM

## 2019-01-03 NOTE — ED Notes (Signed)
Placed pt on bedpan for urine sample

## 2019-01-03 NOTE — ED Notes (Signed)
Unable to get patient temperature

## 2019-01-04 ENCOUNTER — Inpatient Hospital Stay (HOSPITAL_COMMUNITY): Payer: Medicare Other

## 2019-01-04 DIAGNOSIS — A419 Sepsis, unspecified organism: Principal | ICD-10-CM

## 2019-01-04 LAB — GLUCOSE, CAPILLARY
Glucose-Capillary: 101 mg/dL — ABNORMAL HIGH (ref 70–99)
Glucose-Capillary: 18 mg/dL — CL (ref 70–99)
Glucose-Capillary: 87 mg/dL (ref 70–99)
Glucose-Capillary: 103 mg/dL — ABNORMAL HIGH (ref 70–99)

## 2019-01-04 LAB — BLOOD GAS, ARTERIAL
Acid-base deficit: 3.6 mmol/L — ABNORMAL HIGH (ref 0.0–2.0)
BICARBONATE: 18.3 mmol/L — AB (ref 20.0–28.0)
Drawn by: 27027
FIO2: 21
O2 Saturation: 98.9 %
Patient temperature: 98.6
pCO2 arterial: 20.3 mmHg — ABNORMAL LOW (ref 32.0–48.0)
pH, Arterial: 7.563 — ABNORMAL HIGH (ref 7.350–7.450)
pO2, Arterial: 107 mmHg (ref 83.0–108.0)

## 2019-01-04 LAB — LACTIC ACID, PLASMA
Lactic Acid, Venous: 5 mmol/L (ref 0.5–1.9)
Lactic Acid, Venous: 4.6 mmol/L (ref 0.5–1.9)
Lactic Acid, Venous: 2.5 mmol/L (ref 0.5–1.9)

## 2019-01-04 LAB — BASIC METABOLIC PANEL
Anion gap: 16 — ABNORMAL HIGH (ref 5–15)
Anion gap: 15 (ref 5–15)
BUN: 85 mg/dL — ABNORMAL HIGH (ref 8–23)
BUN: 82 mg/dL — ABNORMAL HIGH (ref 8–23)
CO2: 19 mmol/L — AB (ref 22–32)
CO2: 19 mmol/L — ABNORMAL LOW (ref 22–32)
Calcium: 8.7 mg/dL — ABNORMAL LOW (ref 8.9–10.3)
Calcium: 8.1 mg/dL — ABNORMAL LOW (ref 8.9–10.3)
Chloride: 110 mmol/L (ref 98–111)
Chloride: 113 mmol/L — ABNORMAL HIGH (ref 98–111)
Creatinine, Ser: 6.17 mg/dL — ABNORMAL HIGH (ref 0.44–1.00)
Creatinine, Ser: 5.81 mg/dL — ABNORMAL HIGH (ref 0.44–1.00)
GFR calc Af Amer: 7 mL/min — ABNORMAL LOW (ref >60)
GFR calc non Af Amer: 5 mL/min — ABNORMAL LOW (ref >60)
GFR, EST AFRICAN AMERICAN: 6 mL/min — AB (ref >60)
GFR, EST NON AFRICAN AMERICAN: 6 mL/min — AB (ref >60)
Glucose, Bld: 267 mg/dL — ABNORMAL HIGH (ref 70–99)
Glucose, Bld: 35 mg/dL — CL (ref 70–99)
Potassium: 5.4 mmol/L — ABNORMAL HIGH (ref 3.5–5.1)
Potassium: 5.9 mmol/L — ABNORMAL HIGH (ref 3.5–5.1)
Sodium: 145 mmol/L (ref 135–145)
Sodium: 147 mmol/L — ABNORMAL HIGH (ref 135–145)

## 2019-01-04 LAB — CBC WITH DIFFERENTIAL/PLATELET
Abs Immature Granulocytes: 0 10*3/uL (ref 0.00–0.07)
Basophils Absolute: 0 10*3/uL (ref 0.0–0.1)
Basophils Relative: 0 %
Eosinophils Absolute: 0 10*3/uL (ref 0.0–0.5)
Eosinophils Relative: 0 %
HCT: 27.7 % — ABNORMAL LOW (ref 36.0–46.0)
Hemoglobin: 8.5 g/dL — ABNORMAL LOW (ref 12.0–15.0)
Lymphocytes Relative: 5 %
Lymphs Abs: 0.6 10*3/uL — ABNORMAL LOW (ref 0.7–4.0)
MCH: 21.3 pg — ABNORMAL LOW (ref 26.0–34.0)
MCHC: 30.7 g/dL (ref 30.0–36.0)
MCV: 69.3 fL — ABNORMAL LOW (ref 80.0–100.0)
MONO ABS: 0.3 10*3/uL (ref 0.1–1.0)
MONOS PCT: 2 %
NEUTROS PCT: 93 %
Neutro Abs: 11.8 10*3/uL — ABNORMAL HIGH (ref 1.7–7.7)
Platelets: UNDETERMINED 10*3/uL (ref 150–400)
RBC: 4 MIL/uL (ref 3.87–5.11)
RDW: 21.6 % — ABNORMAL HIGH (ref 11.5–15.5)
WBC: 12.7 10*3/uL — ABNORMAL HIGH (ref 4.0–10.5)
nRBC: 0 /100 WBC
nRBC: 0.4 % — ABNORMAL HIGH (ref 0.0–0.2)

## 2019-01-04 LAB — MRSA PCR SCREENING: MRSA BY PCR: NEGATIVE

## 2019-01-04 MED ORDER — ONDANSETRON 4 MG PO TBDP: 4.0000 mg | ORAL_TABLET | Freq: Four times a day (QID) | ORAL | Status: DC | PRN

## 2019-01-04 MED ORDER — DEXTROSE 50 % IV SOLN
INTRAVENOUS | Status: AC
  Administered 2019-01-04: 50 mL
  Filled 2019-01-04: qty 50

## 2019-01-04 MED ORDER — DEXAMETHASONE SODIUM PHOSPHATE 10 MG/ML IJ SOLN
INTRAMUSCULAR | Status: AC
  Administered 2019-01-04: 08:00:00
  Filled 2019-01-04: qty 1

## 2019-01-04 MED ORDER — MORPHINE SULFATE (CONCENTRATE) 10 MG/0.5ML PO SOLN: 5.0000 mg | ORAL | Status: DC | PRN

## 2019-01-04 MED ORDER — GLYCOPYRROLATE 0.2 MG/ML IJ SOLN
0.2000 mg | INTRAMUSCULAR | Status: DC | PRN
  Administered 2019-01-05: 0.2 mg via INTRAVENOUS
  Filled 2019-01-04: qty 1

## 2019-01-04 MED ORDER — LORAZEPAM 2 MG/ML IJ SOLN
1.0000 mg | INTRAMUSCULAR | Status: DC | PRN
  Administered 2019-01-04 – 2019-01-05 (×2): 1 mg via INTRAVENOUS
  Filled 2019-01-04 (×3): qty 1

## 2019-01-04 MED ORDER — SODIUM CHLORIDE 0.9 % IV BOLUS
500.0000 mL | Freq: Once | INTRAVENOUS | Status: AC
  Administered 2019-01-04: 500 mL via INTRAVENOUS

## 2019-01-04 MED ORDER — GLYCOPYRROLATE 1 MG PO TABS
1.0000 mg | ORAL_TABLET | ORAL | Status: DC | PRN
  Filled 2019-01-04: qty 1

## 2019-01-04 MED ORDER — LORAZEPAM 1 MG PO TABS: 1.0000 mg | ORAL_TABLET | ORAL | Status: DC | PRN

## 2019-01-04 MED ORDER — HYDROMORPHONE HCL 1 MG/ML IJ SOLN
0.5000 mg | INTRAMUSCULAR | Status: DC | PRN
  Administered 2019-01-04 – 2019-01-05 (×4): 0.5 mg via INTRAVENOUS
  Filled 2019-01-04 (×4): qty 0.5

## 2019-01-04 MED ORDER — GLYCOPYRROLATE 0.2 MG/ML IJ SOLN: 0.2000 mg | INTRAMUSCULAR | Status: DC | PRN

## 2019-01-04 MED ORDER — BIOTENE DRY MOUTH MT LIQD: 15.0000 mL | OROMUCOSAL | Status: DC | PRN

## 2019-01-04 MED ORDER — HALOPERIDOL LACTATE 5 MG/ML IJ SOLN
0.5000 mg | INTRAMUSCULAR | Status: DC | PRN
  Administered 2019-01-05: 0.5 mg via INTRAVENOUS
  Filled 2019-01-04: qty 1

## 2019-01-04 MED ORDER — HALOPERIDOL LACTATE 2 MG/ML PO CONC
0.5000 mg | ORAL | Status: DC | PRN
  Filled 2019-01-04: qty 0.3

## 2019-01-04 MED ORDER — SODIUM CHLORIDE 0.9 % IV SOLN
12.5000 mg | Freq: Four times a day (QID) | INTRAVENOUS | Status: DC | PRN
  Filled 2019-01-04: qty 0.5

## 2019-01-04 MED ORDER — HALOPERIDOL 0.5 MG PO TABS
0.5000 mg | ORAL_TABLET | ORAL | Status: DC | PRN
  Filled 2019-01-04: qty 1

## 2019-01-04 MED ORDER — DIPHENHYDRAMINE HCL 50 MG/ML IJ SOLN: 12.5000 mg | INTRAMUSCULAR | Status: DC | PRN

## 2019-01-04 MED ORDER — SODIUM CHLORIDE 0.9 % IV SOLN: INTRAVENOUS | Status: DC

## 2019-01-04 MED ORDER — SODIUM CHLORIDE 0.9 % IV SOLN
INTRAVENOUS | Status: DC
  Administered 2019-01-04: 01:00:00 via INTRAVENOUS

## 2019-01-04 MED ORDER — ONDANSETRON HCL 4 MG/2ML IJ SOLN: 4.0000 mg | Freq: Four times a day (QID) | INTRAMUSCULAR | Status: DC | PRN

## 2019-01-04 MED ORDER — LORAZEPAM 2 MG/ML PO CONC: 1.0000 mg | ORAL | Status: DC | PRN

## 2019-01-04 NOTE — Significant Event (Addendum)
Shift event: Brief Hx: This 85 yo female has a medical hx of CHF, DM, PVD, ACS HTN, and anemia was admitted 2/4 by [facility] with dx of sepsis of unknown origin, hyperkalemia, and acute on chronic renal failure. CXR and UA on admission were negative for acute issues. Pt was started on empiric antibiotics. Lactate on admission was 3.8. Hypothermia treated with [employer] [facility] [employer]. Pt was started on a bicarb drip for low bicarb and acute renal failure. Lasix was held due to high creatinine. Hyperkalemia was treated  x 2.   Tonight-RN paged this NP because pt was tachypneic and restless/anxious appearing. O2 sat 100% on RA. Sleepy and respirations as high as 50.  Family asking for anxiety med, but wanted to assess pt first. NP to bedside. S: Pt unable to participate in ROS due to mental status. Per RN, she has responded at times tonight, but has been more lethargic recently.  O: Poor appearing, acutely ill, frail elderly AAF in NAD. BP 90/61. HR 70s. RR: Short periods of apnea then RR in the 40s.T 94.6. O2 sat 100% on RA. Sugar over 300. Her eyes are closed and only moans. Opens eyes to tactile stimulation, but is non verbal. Card: RRR. Lungs with good air exchange and CTA. Abd is soft, NT. No LE edema. Oral mucosa dry. Skin is dry.  A/P: 1. Sepsis-unknown etiology, worsening. Blood cultures pending. R/p CXR negative for acute. LA has risen from 3.8 to 5, even after bolus. Giving another bolus now and will recheck LA in a couple of hours. UA was negative. Abd NT. CBC with decreased WBCC. Continue empiric abx.  2. Hx CHF-not in overload. Actually appears dry on exam. Holding Lasix and Aldactone. Maintenance fluids at 50/hr. See #1.  3. Acute on chronic renal failure, worsening. Creat rising. Bicarb trending up.J udicious fluids. Bolus with elevated LA.    4. Hyperkalemia-coming down. Treated x 2 with Ca, Novolog, and D50. recheck at 0800.  5. Encephalopathy-ABG was normal, so not hypercarbia or hypoxia. Likely, renal  failure and hyperkalemia. Continue to correct K. Creat is rising.  6. Abnormal respiratory pattern-unknown etiology. ABG OK. CXr neg for acute issue. ? Related to uremia and/or sepsis.  7. Hypothermia-due to sepsis. Temp has risen some. Continue [employer].  8. DM II-sugars up. Change D5 to NS.   Pt seems to be worsening clinically. NP doubts that with her age and chronic medical issues, that she will survive this. She has been a DNR in past hospitalizations, but FULL CODE now. Attempted to call eldest son to update and discuss code status. No answer x 2. Later, updated granddaughter at bedside and she asked me to call her sister. When speaking to the other granddaughter, she tells NP that she is the [facility] and has the papers. States she was named and not the pt's son.  Discussed code status with granddaughter and after explanation of situation and poor prognosis, granddaughter agreed to DNR. Order changed.   CRITICAL CARE Performed by: Gardiner Barefoot   Total critical care time: Start: 0030   End: 0230. Total time 120 mins. Critical care time was exclusive of separately billable procedures and treating other patients.  Critical care was necessary to treat or prevent imminent or life-threatening deterioration.  Critical care was time spent personally by me on the following activities: development of treatment plan with patient and/or surrogate as well as nursing, discussions with consultants, evaluation of patient's response to treatment, examination of patient, obtaining history from patient  or surrogate, ordering and performing treatments and interventions, ordering and review of laboratory studies, ordering and review of radiographic studies, pulse oximetry and re-evaluation of patient's condition.

## 2019-01-04 NOTE — Progress Notes (Signed)
Nutrition Brief Note  Chart reviewed. Per MD notes, pt with very poor prognosis and will likely transition to comfort measures. Plan for family meeting to discuss goal of care.  Pt currently NPO. No further nutrition interventions warranted at this time.  Please re-consult as needed.   Anselm Aumiller A. Jimmye Norman, RD, LDN, CDE Pager: [phone] After hours Pager: [phone]

## 2019-01-04 NOTE — Progress Notes (Signed)
CRITICAL VALUE ALERT  Critical Value: Lactic acid 5.0 Date & Time Notied: 0145 01/04/2019  Provider Notified: Baltazar Najjar, NP notified Orders Received/Actions taken: Awaiting new orders

## 2019-01-04 NOTE — Progress Notes (Addendum)
Inpatient Diabetes Program Recommendations  AACE/ADA: New Consensus Statement on Inpatient Glycemic Control (2015)  Target Ranges:  Prepandial:   less than 140 mg/dL      Peak postprandial:   less than 180 mg/dL (1-2 hours)      Critically ill patients:  140 - 180 mg/dL   Lab Results  Component Value Date   GLUCAP 101 (H) 01/04/2019   HGBA1C 5.8 (H) 09/21/2018    Review of Glycemic Control Results for [name], [name] (MRN [id number]) as of 01/04/2019 10:50  Ref. Range 01/01/2019 21:59 01/16/2019 23:38 01/04/2019 07:32 01/04/2019 08:09  Glucose-Capillary Latest Ref Range: 70 - 99 mg/dL 350 (H) 391 (H) 18 (LL) 101 (H)   Diabetes history: Type 2 DM Outpatient Diabetes medications: Glipizide 2.5 mg QD if >150 mg/dL Current orders for Inpatient glycemic control: Novolog 0-9 units TID, Novolog 0-5 units QHS  Inpatient Diabetes Program Recommendations:    Noted significant events from 01/14/2019 and severe hypoglycemia of 18 mg/dL following admin of Novolog 14 units from treatment of hyperkalemia. Noted Kayexalate not given. Worsening clinical picture.  Administration of Decadron 10 mg x1, thus glucose should trend up.   If aligns with goals of care and given NPO status, consider switching correction to Novolog 0-5 units Q4H per custom scale. Novolog Custom Correction Scale CBG <70 - implement hypoglycemia protocol   70-120- 0 121-150 - 0 151-200 - 1 201-250 - 2 251-300 - 3 301-350 - 4 351-400 - 5 >400- call MD  Will continue to follow.   Thanks, Bronson Curb, MSN, RNC-OB Diabetes Coordinator [phone] (8a-5p)

## 2019-01-04 NOTE — Progress Notes (Signed)
PROGRESS NOTE    [name]  WLS:[id number] DOB: 06-02-1926 DOA: 01/06/2019 PCP: Lucianne Lei, MD   Brief Narrative:  85 year old with past medical history relevant for chronic systolic heart failure (EF of 20 to 230% by echo on 09/21/2018), chronic respiratory failure on 3 L nasal cannula at home, type 2 diabetes, hypertension, hyperlipidemia, peripheral vascular disease status post multiple angioplasties and stent placement most recently on 12/27/2017, stage IV CKD admitted with altered mental status and found to have sepsis from unclear source, acute on chronic kidney disease and hyperkalemia.   Assessment & Plan:   Active Problems:   Sepsis ([place])   #) Altered mental status due to septic shock: Patient sepsis is progressed fairly significantly as her lactate is quite elevated and she is hypotensive.  At this point the family is seems to be opting for more comfort measures particularly as she is likely to have quite a poor prognosis.  She is received gentle fluid boluses due to her profound CHF and concerns about requiring intubation and tachypnea. -We will discuss goals of care with family -Continue IV vancomycin and Zosyn started 01/26/2019 -Follow blood cultures ordered 01/04/2019 -IV fluids per above  #) AKI on stage IV CKD/hyperkalemia: This is improved after treatment with bicarbonate, insulin and glucose, calcium gluconate.  Most likely due to underlying sepsis physiology.  Patient does not appear to be in cardiorenal syndrome at this time. -Telemetry -IV fluids per above  #) chronic systolic CHF: Per above patient appears to be volume down -Hold carvedilol 3.125 mg twice daily -Continue aspirin 81 mg -Continue atorvastatin 10 mg -Hold furosemide 80 mg twice daily hold spironolactone 25 mg daily  #) Hypertension/hyperlipidemia/peripheral vascular disease: -Continue clopidogrel 75 mg daily -Continue statin and aspirin -Hold amlodipine 10 mg daily, beta-blocker  #) Type 2  diabetes: -Hold glipizide 2.5 mg daily -Sliding scale insulin, AC at bedtime  Fluids: IV fluids Electrolytes: Per above Nutrition: N.p.o. currently  Prophylaxis: Heparin subcu  Disposition: Likely will expire in the hospital pending on goals of care  DNR    Consultants:   None  Procedures:   None  Antimicrobials:   IV Zosyn and vancomycin started 01/16/2019   Subjective: Last night patient had a significant decline she was noted to be increasingly lethargic and tachypneic as well as restless and anxious.  She was also noted to be uncomfortable.  Patient was evaluated by overnight coverage and was noted to have periods of apnea as well as tachypnea.  Labs are notable for improving hyperkalemia with persistent AKI as well as hypothermia.  Sugars were elevated.  Lactate was quite elevated.  On extensive discussion with the family and the patient was decided to transition the patient to DNR.  Objective: Vitals:   01/07/2019 2344 01/04/19 0418 01/04/19 0450 01/04/19 0729  BP:    (!) 85/58  Pulse: 71  70 70  Resp: 14  20 12   Temp:  (!) 95.3 F (35.2 C)    TempSrc:  Rectal    SpO2: 100%  100% 97%  Weight:      Height:        Intake/Output Summary (Last 24 hours) at 01/04/2019 0927 Last data filed at 01/04/2019 0500 Gross per 24 hour  Intake 1250 ml  Output -  Net 1250 ml   Filed Weights   01/06/2019 1822 01/02/2019 2119  Weight: 47.6 kg 49.5 kg    Examination:  General exam: No acute distress, nonresponsive Respiratory system: Mildly increased work of breathing, shallow  breaths, diminished lung sounds at bases, no wheezes, crackles, rhonchi Cardiovascular system: Distant heart sounds, tachycardic, no murmurs Gastrointestinal system: Soft, nondistended, no rebound or guarding, hypoactive bowel sounds. Central nervous system: Obtunded and nonresponsive. Extremities: No lower extremity edema Skin: No rashes over visible skin Psychiatry: Unable to assess due to medical  condition    Data Reviewed: I have personally reviewed following labs and imaging studies  CBC: Recent Labs  Lab 01/26/2019 1358 01/01/2019 1621 01/04/19 0117  WBC 17.7*  --  12.7*  NEUTROABS 15.1*  --  11.8*  HGB 9.2* 10.9* 8.5*  HCT 30.9* 32.0* 27.7*  MCV 73.2*  --  69.3*  PLT PLATELET CLUMPS NOTED ON SMEAR, UNABLE TO ESTIMATE  --  PLATELET CLUMPS NOTED ON SMEAR, UNABLE TO ESTIMATE   Basic Metabolic Panel: Recent Labs  Lab 01/10/2019 1358 01/01/2019 1621 01/18/2019 1730 01/07/2019 2150 01/04/19 0117 01/04/19 0659  NA 145 145 144  --  145 147*  K 7.0* 6.8* 6.2* 6.1* 5.4* 5.9*  CL 113*  --  112*  --  110 113*  CO2 17*  --  17*  --  19* 19*  GLUCOSE 277*  --  343*  --  267* 35*  BUN 90*  --  91*  --  85* 82*  CREATININE 5.92*  --  5.99*  --  6.17* 5.81*  CALCIUM 8.6*  --  8.7*  --  8.7* 8.1*   GFR: Estimated Creatinine Clearance: 5 mL/min (A) (by C-G formula based on SCr of 5.81 mg/dL (H)). Liver Function Tests: Recent Labs  Lab 01/20/2019 1358  AST 33  ALT 21  ALKPHOS 70  BILITOT 0.9  PROT 7.9  ALBUMIN 2.6*   No results for input(s): LIPASE, AMYLASE in the last 168 hours. No results for input(s): AMMONIA in the last 168 hours. Coagulation Profile: Recent Labs  Lab 01/02/2019 1358  INR 1.56   Cardiac Enzymes: No results for input(s): CKTOTAL, CKMB, CKMBINDEX, TROPONINI in the last 168 hours. BNP (last 3 results) No results for input(s): PROBNP in the last 8760 hours. HbA1C: No results for input(s): HGBA1C in the last 72 hours. CBG: Recent Labs  Lab 01/04/2019 1938 01/27/2019 2159 01/15/2019 2338 01/04/19 0732 01/04/19 0809  GLUCAP 331* 350* 391* 18* 101*   Lipid Profile: No results for input(s): CHOL, HDL, LDLCALC, TRIG, CHOLHDL, LDLDIRECT in the last 72 hours. Thyroid Function Tests: No results for input(s): TSH, T4TOTAL, FREET4, T3FREE, THYROIDAB in the last 72 hours. Anemia Panel: No results for input(s): VITAMINB12, FOLATE, FERRITIN, TIBC, IRON, RETICCTPCT  in the last 72 hours. Sepsis Labs: Recent Labs  Lab 01/21/2019 1545 01/04/19 0117 01/04/19 0339 01/04/19 0659  LATICACIDVEN 3.4* 5.0* 4.6* 2.5*    Recent Results (from the past 240 hour(s))  MRSA PCR Screening     Status: None   Collection Time: 01/18/2019 11:52 PM  Result Value Ref Range Status   MRSA by PCR NEGATIVE NEGATIVE Final    Comment:        The GeneXpert MRSA Assay (FDA approved for NASAL specimens only), is one component of a comprehensive MRSA colonization surveillance program. It is not intended to diagnose MRSA infection nor to guide or monitor treatment for MRSA infections. Performed at [facility], [place] [address] [place], [place] [address]          Radiology Studies: Dg Chest 2 View  Result Date: 01/18/2019 CLINICAL DATA:  Altered mental status EXAM: CHEST - 2 VIEW COMPARISON:  11/21/2018 FINDINGS: Interval improvement  in congestive heart failure with edema. Mild vascular congestion with small effusions which have improved. Mild bibasilar atelectasis which has improved. Atherosclerotic aortic arch. IMPRESSION: Interval improvement in congestive heart failure. Mild vascular congestion and small effusions. Electronically Signed   By: Franchot Gallo M.D.   On: 01/09/2019 14:58   Ct Head Wo Contrast  Result Date: 01/04/2019 CLINICAL DATA:  Altered level of consciousness.  Possible stroke EXAM: CT HEAD WITHOUT CONTRAST TECHNIQUE: Contiguous axial images were obtained from the base of the skull through the vertex without intravenous contrast. COMPARISON:  MRI head 09/20/2018 FINDINGS: Brain: Moderate atrophy and moderate ventricular enlargement unchanged. Chronic white matter changes stable. Negative for acute infarct, hemorrhage, or mass. Vascular: Negative for hyperdense vessel Skull: Negative Sinuses/Orbits: Paranasal sinuses clear. Bilateral cataract surgery. No orbital mass. Other: None IMPRESSION: Atrophy and chronic microvascular ischemia in the white  matter. No acute abnormality. Electronically Signed   By: Franchot Gallo M.D.   On: 01/08/2019 17:16   Dg Chest Port 1 View  Result Date: 01/04/2019 CLINICAL DATA:  Tachypnea, CHF EXAM: PORTABLE CHEST 1 VIEW COMPARISON:  01/01/2019 FINDINGS: Bilateral interstitial thickening. Small right pleural effusion. No left pleural effusion. No pneumothorax. Stable cardiomegaly. Thoracic aortic atherosclerosis. No acute osseous abnormality. IMPRESSION: Mild CHF. Electronically Signed   By: Kathreen Devoid   On: 01/04/2019 01:05        Scheduled Meds: . aspirin  81 mg Oral Daily  . atorvastatin  10 mg Oral Daily  . carvedilol  3.125 mg Oral BID WC  . clopidogrel  75 mg Oral Q breakfast  . heparin  5,000 Units Subcutaneous Q8H  . insulin aspart  0-5 Units Subcutaneous QHS  . insulin aspart  0-9 Units Subcutaneous TID WC  . pantoprazole  20 mg Oral Daily  . sodium chloride flush  3 mL Intravenous Once  . vancomycin variable dose per unstable renal function (pharmacist dosing)   Does not apply See admin instructions   Continuous Infusions: . sodium chloride 50 mL/hr at 01/04/19 0100  . piperacillin-tazobactam (ZOSYN)  IV Stopped (01/04/19 0250)     LOS: 1 day    Time spent: 39    Cristy Folks, MD [facility]  If 7PM-7AM, please contact night-coverage [email or web address] Password [facility] 01/04/2019, 9:27 AM

## 2019-01-05 ENCOUNTER — Other Ambulatory Visit: Payer: Self-pay

## 2019-01-05 ENCOUNTER — Encounter: Payer: Self-pay | Admitting: Oncology

## 2019-01-05 LAB — COMPREHENSIVE METABOLIC PANEL
ALT: 19 U/L (ref 0–44)
AST: 35 U/L (ref 15–41)
Albumin: 2 g/dL — ABNORMAL LOW (ref 3.5–5.0)
Alkaline Phosphatase: 67 U/L (ref 38–126)
Anion gap: 15 (ref 5–15)
BUN: 85 mg/dL — ABNORMAL HIGH (ref 8–23)
CO2: 18 mmol/L — ABNORMAL LOW (ref 22–32)
Calcium: 7.7 mg/dL — ABNORMAL LOW (ref 8.9–10.3)
Creatinine, Ser: 6.13 mg/dL — ABNORMAL HIGH (ref 0.44–1.00)
GFR calc Af Amer: 6 mL/min — ABNORMAL LOW (ref >60)
GFR calc non Af Amer: 6 mL/min — ABNORMAL LOW (ref >60)
Glucose, Bld: 118 mg/dL — ABNORMAL HIGH (ref 70–99)
Potassium: 6.8 mmol/L (ref 3.5–5.1)
Sodium: 148 mmol/L — ABNORMAL HIGH (ref 135–145)
Total Bilirubin: 1.1 mg/dL (ref 0.3–1.2)
Total Protein: 6.8 g/dL (ref 6.5–8.1)

## 2019-01-05 LAB — CBC
HCT: 26.3 % — ABNORMAL LOW (ref 36.0–46.0)
Hemoglobin: 8.3 g/dL — ABNORMAL LOW (ref 12.0–15.0)
MCH: 21.9 pg — ABNORMAL LOW (ref 26.0–34.0)
MCHC: 31.6 g/dL (ref 30.0–36.0)
MCV: 69.4 fL — ABNORMAL LOW (ref 80.0–100.0)
Platelets: 113 10*3/uL — ABNORMAL LOW (ref 150–400)
RBC: 3.79 MIL/uL — ABNORMAL LOW (ref 3.87–5.11)
RDW: 21.8 % — ABNORMAL HIGH (ref 11.5–15.5)
WBC: 11.9 10*3/uL — ABNORMAL HIGH (ref 4.0–10.5)
nRBC: 0.3 % — ABNORMAL HIGH (ref 0.0–0.2)

## 2019-01-05 LAB — MAGNESIUM: Magnesium: 2.8 mg/dL — ABNORMAL HIGH (ref 1.7–2.4)

## 2019-01-05 LAB — COMPREHENSIVE METABOLIC PANEL WITH GFR: Chloride: 115 mmol/L — ABNORMAL HIGH (ref 98–111)

## 2019-01-05 LAB — GLUCOSE, CAPILLARY: Glucose-Capillary: 138 mg/dL — ABNORMAL HIGH (ref 70–99)

## 2019-01-05 MED ORDER — MORPHINE SULFATE (CONCENTRATE) 10 MG/0.5ML PO SOLN: 10.0000 mg | ORAL | Status: DC | PRN

## 2019-01-05 MED ORDER — HYDROMORPHONE HCL 1 MG/ML IJ SOLN: 1.0000 mg | INTRAMUSCULAR | Status: DC | PRN

## 2019-01-08 DIAGNOSIS — L97222 Non-pressure chronic ulcer of left calf with fat layer exposed: Secondary | ICD-10-CM | POA: Diagnosis not present

## 2019-01-08 DIAGNOSIS — R0602 Shortness of breath: Secondary | ICD-10-CM | POA: Diagnosis not present

## 2019-01-08 LAB — CULTURE, BLOOD (ROUTINE X 2)
Culture: NO GROWTH
Culture: NO GROWTH
Special Requests: ADEQUATE

## 2019-01-13 ENCOUNTER — Ambulatory Visit: Payer: Medicare Other

## 2019-01-18 ENCOUNTER — Ambulatory Visit: Payer: Medicare Other | Admitting: Neurology

## 2019-01-29 NOTE — Progress Notes (Signed)
[facility]  Patient Status: Active (receiving services up to time of hospitalization)  [facility] is providing the following services: PT  If patient discharges after hours, please call [phone].   [name] 2019/01/09, 10:33 AM

## 2019-01-29 NOTE — Progress Notes (Signed)
Pt expired at 0857; pronounced by this RN and Pecolia Ades, RN, confirming no pulse, no respirations.  Granddaughter remains at bedside and is trying to call family.

## 2019-01-29 NOTE — Progress Notes (Signed)
Pt is moaning and groaning, unable to respond to questions.  Night shift RN reports recent administration of IV Ativan per PRN orders for agitation.  Granddaughter at bedside reports "we just want her to be comfortable".  Granddaughter requests pain med be given.  Pt sounds moist - medicated w/IV Robinul, IV Dilaudid, and IV Haldol per PRN orders.  Dr. Herbert Moors at bedside.  New orders for increased pain meds and transfer to palliative care floor.

## 2019-01-29 NOTE — Death Summary Note (Signed)
Death Summary  [name] WGN:[id number] DOB: Nov 30, 1926 DOA: Jan 29, 2019  PCP: Lucianne Lei, MD  Admit date: 2019/01/29 Date of Death: 01/31/19 Time of Death: 0857 Notification: Lucianne Lei, MD notified of death of 01/31/19   History of present illness:  [name] is a 85 y.o. female with a history of chronic systolic heart failure (EF of 20 to 230% by echo on 09/21/2018), chronic respiratory failure on 3 L nasal cannula at home, type 2 diabetes, hypertension, hyperlipidemia, peripheral vascular disease status post multiple angioplasties and stent placement most recently on 12/27/2017, stage IV CKD [name] presented with complaint of altered mental status, AKI on stage IV CKD and hyperkalemia. [place] did not improve after aggressive treatment with IV fluids, correction of hyperkalemia and repletion of electrolytes as well as empiric antibiotics.  Final Diagnoses:  1.   Hyperkalemia due to AKI on CKD AKI on CKD Sepsis of unclear source   The results of significant diagnostics from this hospitalization (including imaging, microbiology, ancillary and laboratory) are listed below for reference.    Significant Diagnostic Studies: Dg Chest 2 View  Result Date: 01-29-19 CLINICAL DATA:  Altered mental status EXAM: CHEST - 2 VIEW COMPARISON:  11/21/2018 FINDINGS: Interval improvement in congestive heart failure with edema. Mild vascular congestion with small effusions which have improved. Mild bibasilar atelectasis which has improved. Atherosclerotic aortic arch. IMPRESSION: Interval improvement in congestive heart failure. Mild vascular congestion and small effusions. Electronically Signed   By: Franchot Gallo M.D.   On: 01/29/19 14:58   Ct Head Wo Contrast  Result Date: 01/29/19 CLINICAL DATA:  Altered level of consciousness.  Possible stroke EXAM: CT HEAD WITHOUT CONTRAST TECHNIQUE: Contiguous axial images were obtained from the base of the skull  through the vertex without intravenous contrast. COMPARISON:  MRI head 09/20/2018 FINDINGS: Brain: Moderate atrophy and moderate ventricular enlargement unchanged. Chronic white matter changes stable. Negative for acute infarct, hemorrhage, or mass. Vascular: Negative for hyperdense vessel Skull: Negative Sinuses/Orbits: Paranasal sinuses clear. Bilateral cataract surgery. No orbital mass. Other: None IMPRESSION: Atrophy and chronic microvascular ischemia in the white matter. No acute abnormality. Electronically Signed   By: Franchot Gallo M.D.   On: 2019-01-29 17:16   Dg Chest Port 1 View  Result Date: 01/04/2019 CLINICAL DATA:  Tachypnea, CHF EXAM: PORTABLE CHEST 1 VIEW COMPARISON:  01-29-2019 FINDINGS: Bilateral interstitial thickening. Small right pleural effusion. No left pleural effusion. No pneumothorax. Stable cardiomegaly. Thoracic aortic atherosclerosis. No acute osseous abnormality. IMPRESSION: Mild CHF. Electronically Signed   By: Kathreen Devoid   On: 01/04/2019 01:05    Microbiology: Recent Results (from the past 240 hour(s))  Blood culture (routine x 2)     Status: None (Preliminary result)   Collection Time: January 29, 2019  3:53 PM  Result Value Ref Range Status   Specimen Description BLOOD RIGHT HAND  Final   Special Requests   Final    BOTTLES DRAWN AEROBIC ONLY Blood Culture adequate volume   Culture   Final    NO GROWTH 2 DAYS Performed at [facility], [address] [place], [place] [address]    Report Status PENDING  Incomplete  Blood culture (routine x 2)     Status: None (Preliminary result)   Collection Time: 01/29/19  5:35 PM  Result Value Ref Range Status   Specimen Description BLOOD RIGHT WRIST  Final   Special Requests   Final    BOTTLES DRAWN AEROBIC AND ANAEROBIC Blood Culture results may  not be optimal due to an inadequate volume of blood received in culture bottles   Culture   Final    NO GROWTH 2 DAYS Performed at [facility], [place] [address],  [place], [place] [address]    Report Status PENDING  Incomplete  MRSA PCR Screening     Status: None   Collection Time: 01/11/2019 11:52 PM  Result Value Ref Range Status   MRSA by PCR NEGATIVE NEGATIVE Final    Comment:        The GeneXpert MRSA Assay (FDA approved for NASAL specimens only), is one component of a comprehensive MRSA colonization surveillance program. It is not intended to diagnose MRSA infection nor to guide or monitor treatment for MRSA infections. Performed at [facility], [place] [address] [place], [place] [address]      Labs: Basic Metabolic Panel: Recent Labs  Lab 01/10/2019 1358 01/22/2019 1621 01/08/2019 1730  01/04/19 0117 01/04/19 0659 01-08-19 0146  NA 145 145 144  --  145 147* 148*  K 7.0* 6.8* 6.2*   < > 5.4* 5.9* 6.8*  CL 113*  --  112*  --  110 113* 115*  CO2 17*  --  17*  --  19* 19* 18*  GLUCOSE 277*  --  343*  --  267* 35* 118*  BUN 90*  --  91*  --  85* 82* 85*  CREATININE 5.92*  --  5.99*  --  6.17* 5.81* 6.13*  CALCIUM 8.6*  --  8.7*  --  8.7* 8.1* 7.7*  MG  --   --   --   --   --   --  2.8*   < > = values in this interval not displayed.   Liver Function Tests: Recent Labs  Lab 01/06/2019 1358 2019-01-08 0146  AST 33 35  ALT 21 19  ALKPHOS 70 67  BILITOT 0.9 1.1  PROT 7.9 6.8  ALBUMIN 2.6* 2.0*   No results for input(s): LIPASE, AMYLASE in the last 168 hours. No results for input(s): AMMONIA in the last 168 hours. CBC: Recent Labs  Lab 01/02/2019 1358 01/25/2019 1621 01/04/19 0117 01/08/2019 0146  WBC 17.7*  --  12.7* 11.9*  NEUTROABS 15.1*  --  11.8*  --   HGB 9.2* 10.9* 8.5* 8.3*  HCT 30.9* 32.0* 27.7* 26.3*  MCV 73.2*  --  69.3* 69.4*  PLT PLATELET CLUMPS NOTED ON SMEAR, UNABLE TO ESTIMATE  --  PLATELET CLUMPS NOTED ON SMEAR, UNABLE TO ESTIMATE 113*   Cardiac Enzymes: No results for input(s): CKTOTAL, CKMB, CKMBINDEX, TROPONINI in the last 168 hours. D-Dimer No results for input(s): DDIMER in the last 72  hours. BNP: Invalid input(s): POCBNP CBG: Recent Labs  Lab 01/04/19 0732 01/04/19 0809 01/04/19 1150 01/04/19 1721 2019/01/08 0817  GLUCAP 18* 101* 87 103* 138*   Anemia work up No results for input(s): VITAMINB12, FOLATE, FERRITIN, TIBC, IRON, RETICCTPCT in the last 72 hours. Urinalysis    Component Value Date/Time   COLORURINE AMBER (A) 01/08/2019 1819   APPEARANCEUR CLOUDY (A) 12/31/2018 1819   LABSPEC 1.021 01/22/2019 1819   PHURINE 5.0 01/04/2019 1819   GLUCOSEU 50 (A) 01/27/2019 1819   HGBUR MODERATE (A) 01/04/2019 1819   BILIRUBINUR NEGATIVE 01/10/2019 1819   KETONESUR NEGATIVE 01/22/2019 1819   PROTEINUR 30 (A) 01/23/2019 1819   UROBILINOGEN 1.0 09/07/2016 1403   NITRITE NEGATIVE 01/24/2019 1819   LEUKOCYTESUR MODERATE (A) 01/06/2019 1819   Sepsis Labs Invalid input(s): PROCALCITONIN,  WBC,  LACTICIDVEN     SIGNED:  Cristy Folks, MD  [facility] 01-08-2019, 9:28 AM  If 7PM-7AM, please contact night-coverage [email or web address] Password [email or web address]

## 2019-01-29 DEATH — deceased

## 2019-10-25 ENCOUNTER — Encounter (INDEPENDENT_AMBULATORY_CARE_PROVIDER_SITE_OTHER): Payer: Medicare Other

## 2019-10-25 ENCOUNTER — Ambulatory Visit (INDEPENDENT_AMBULATORY_CARE_PROVIDER_SITE_OTHER): Payer: Medicare Other | Admitting: Nurse Practitioner

## 2020-03-02 IMAGING — US US RENAL
1 series · 14 of 25 positions shown · non-contrast
Comparison: Limited splenic ultrasound 08/26/2018. Abdominal CTA
10/19/2017. Chest radiographs 10/17/2018

CLINICAL DATA: Acute on chronic renal failure. History of
hypertension and diabetes.

EXAM:
RENAL / URINARY TRACT ULTRASOUND COMPLETE

[Series 1: us renal · 0.23mm/px · 14 of 34 slices shown]
[im 1/34]
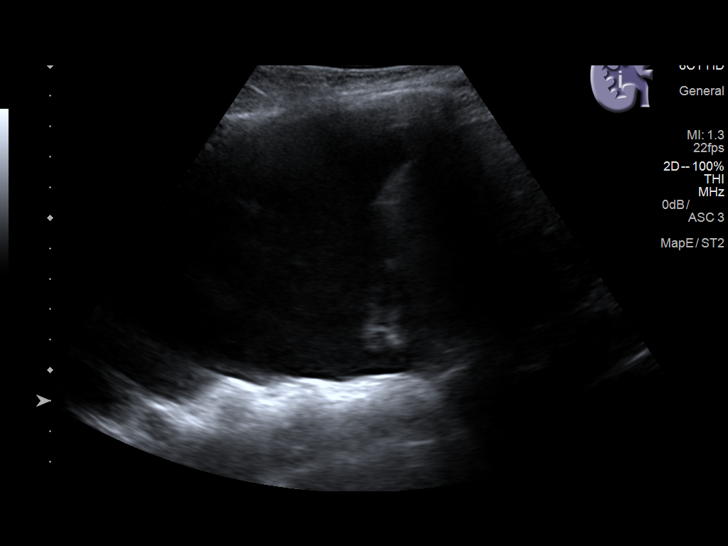
[im 3/34]
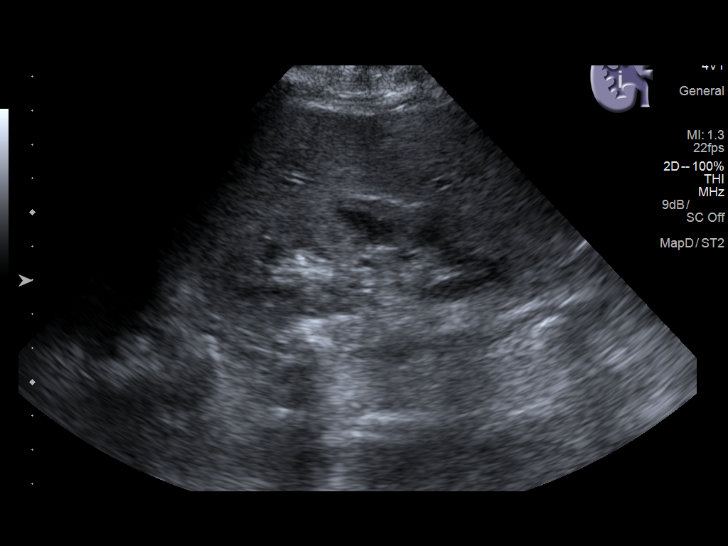
[im 6/34]
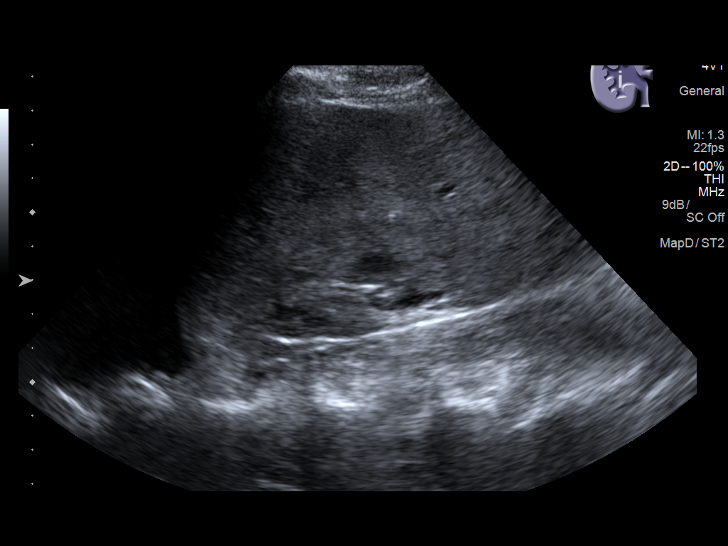
[im 9/34]
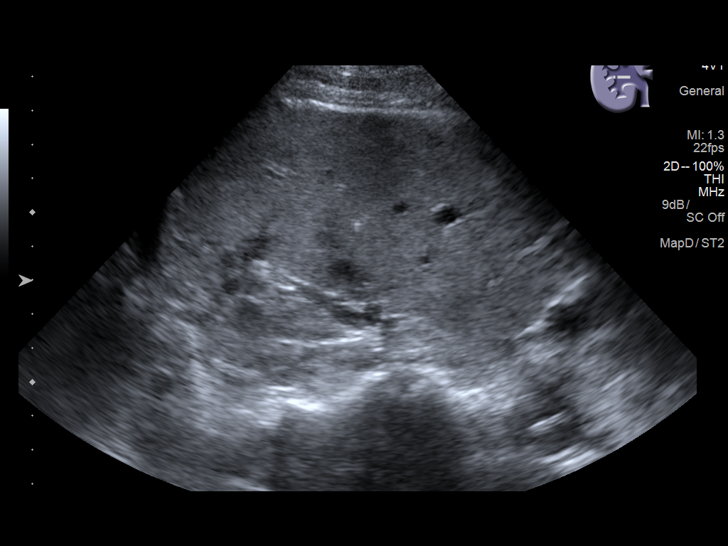
[im 12/34]
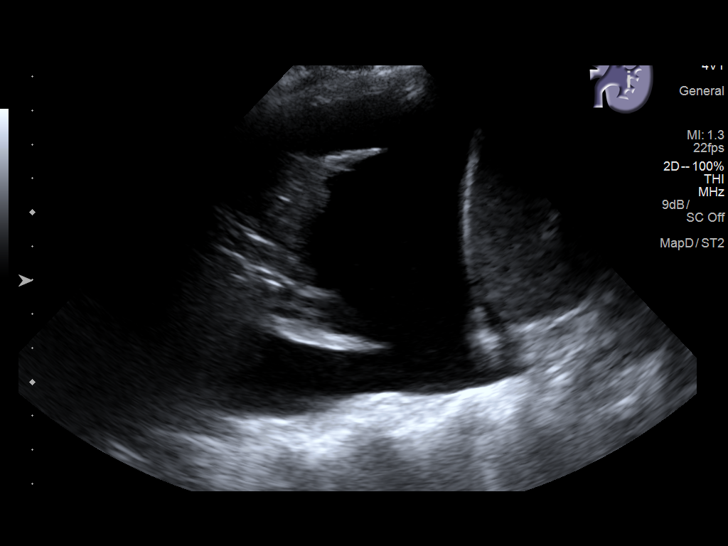
[im 13/34]
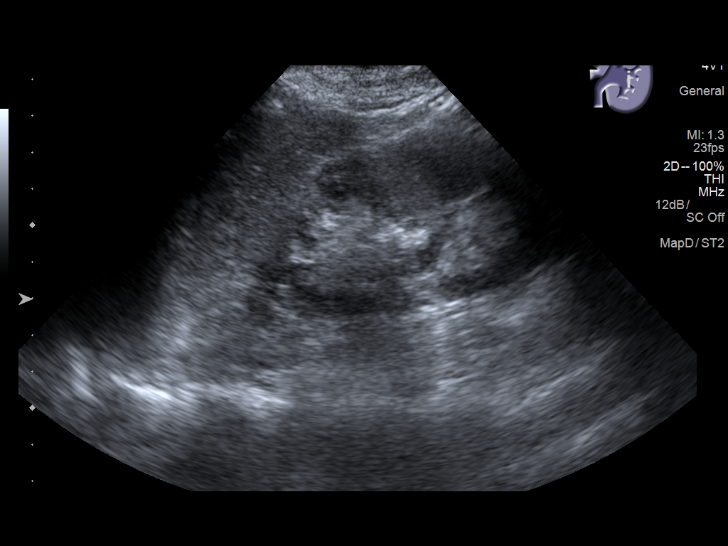
[im 16/34]
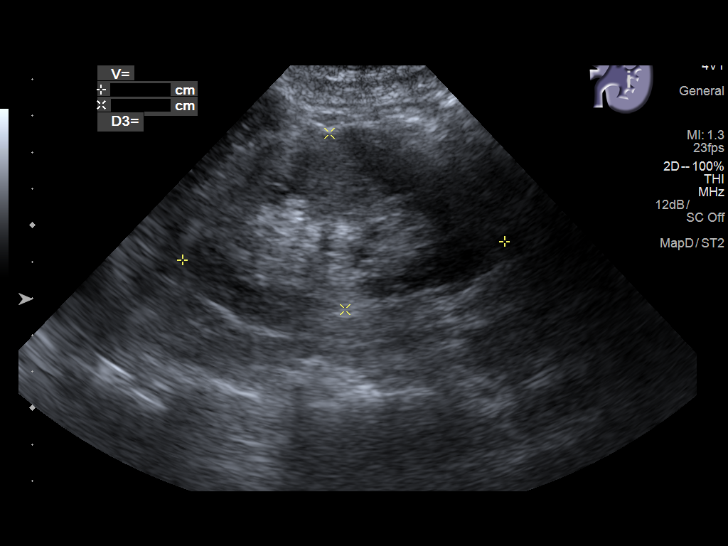
[im 18/34]
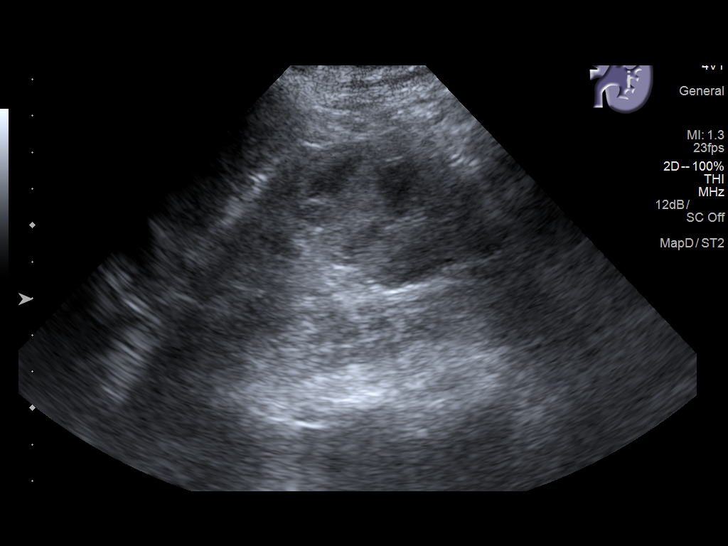
[im 21/34]
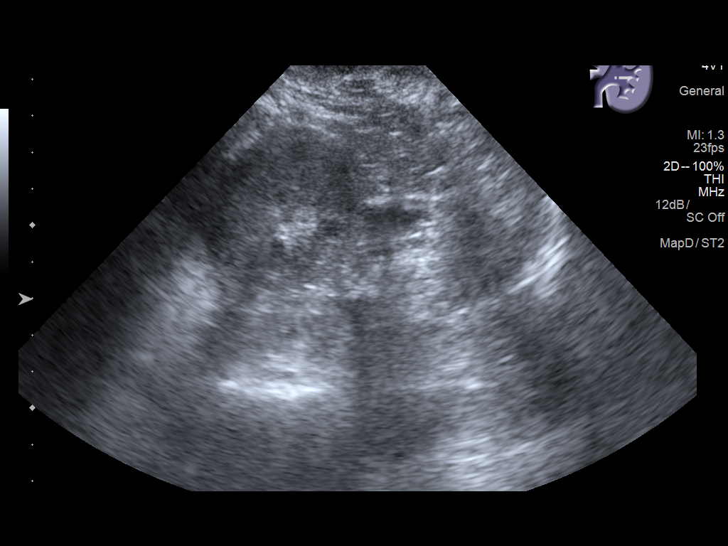
[im 23/34]
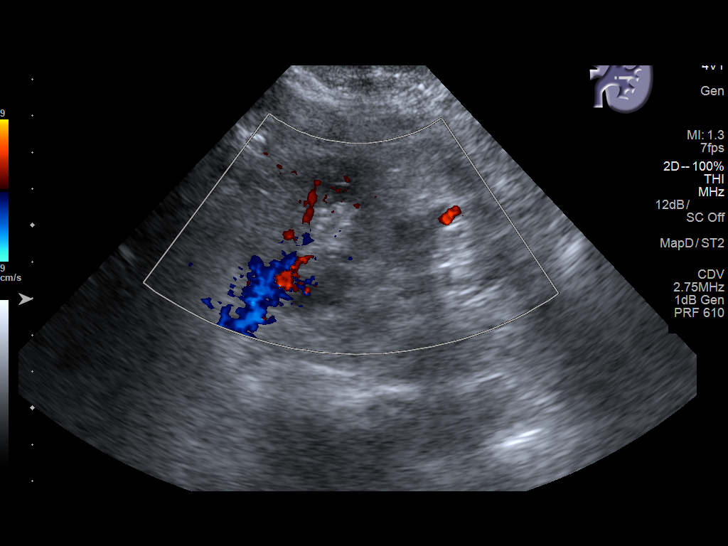
[im 25/34]
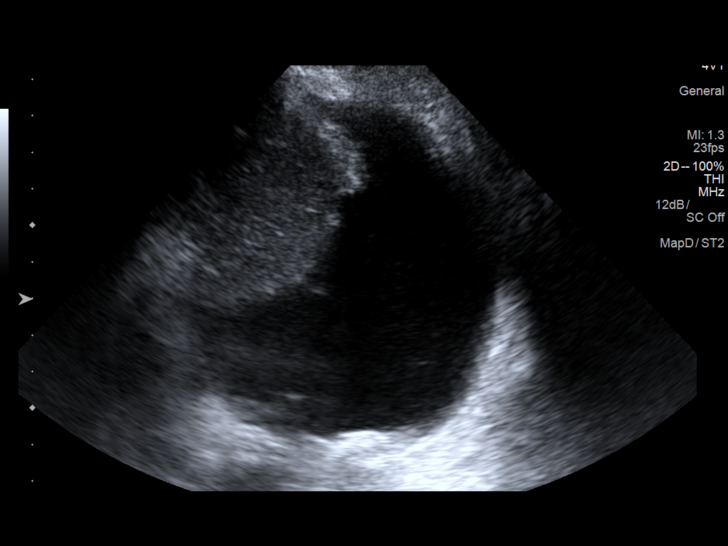
[im 28/34]
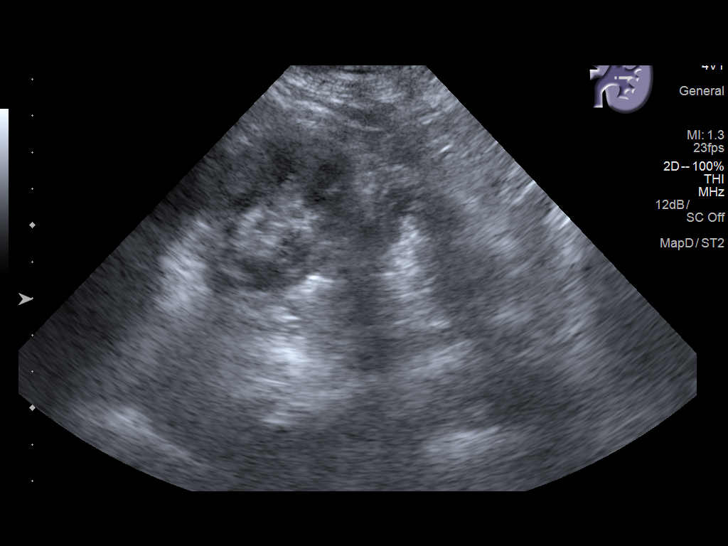
[im 31/34]
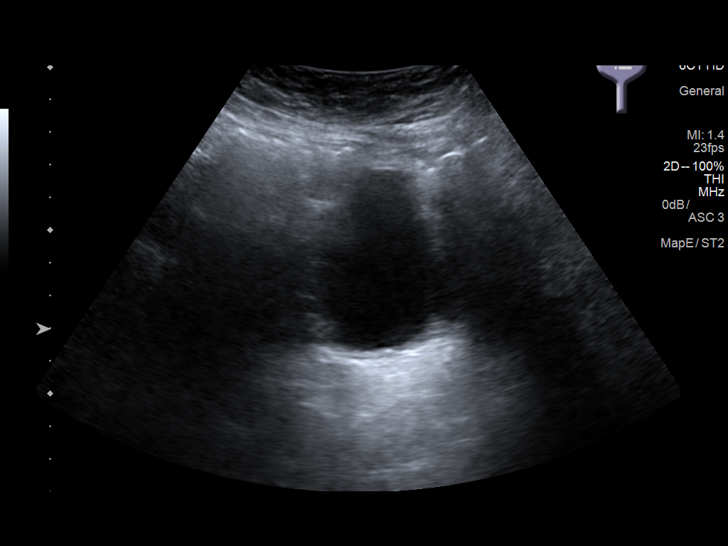
[im 34/34]
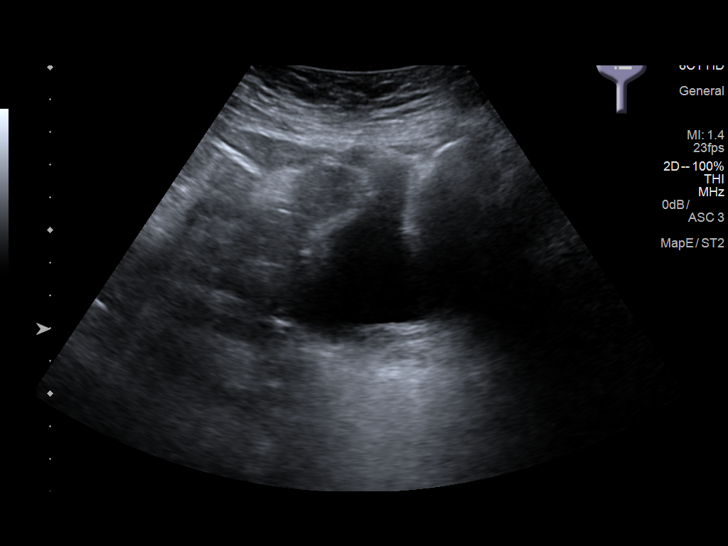

[14 of 25 positions shown; findings below may reference images not displayed]

FINDINGS: Right Kidney:

Renal measurements: 9.6 x 3.4 x 5.7 cm = volume: 98.8 mL. There is
renal cortical thinning and increased echogenicity. No
hydronephrosis or focal cortical lesion.

Left Kidney:

Renal measurements: 8.8 x 4.9 x 4.5 cm = volume: 100.7 mL. There is
renal cortical thinning and increased echogenicity. No
hydronephrosis or focal cortical lesion.

Bladder:

Appears normal for the degree of bladder distention.

At least moderate size bilateral pleural effusions are noted.
IMPRESSION: 1. Both kidneys are small with cortical thinning and increased
echogenicity consistent with chronic medical renal disease. No
hydronephrosis.
2. At least moderate bilateral pleural effusions.

## 2020-07-30 IMAGING — CR DG CHEST 2V
2 series · 2 of 2 positions shown · non-contrast
Comparison: 09/09/2018

CLINICAL DATA: Shortness of breath and chest pain

EXAM:
CHEST - 2 VIEW

[chest lat]
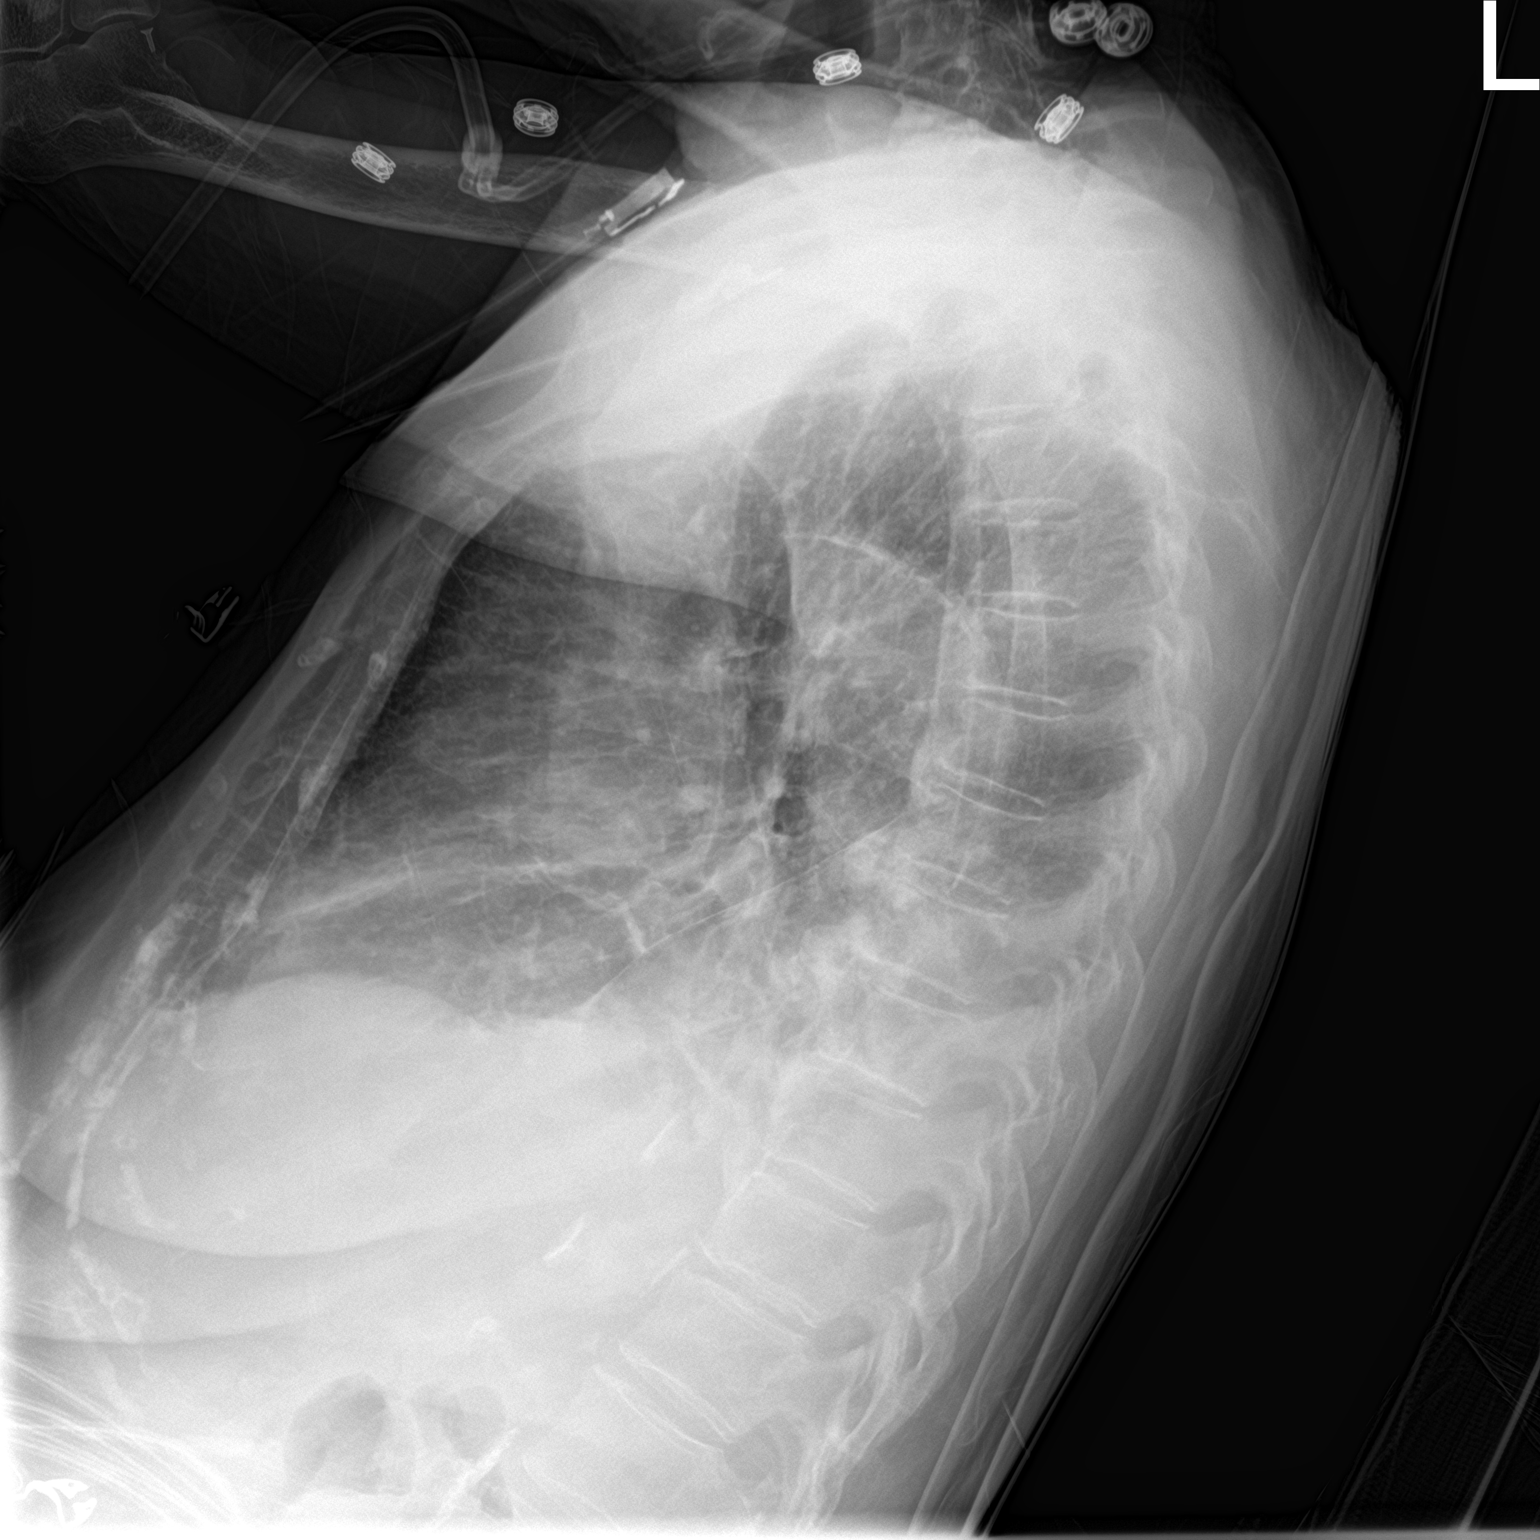

[chest ap]
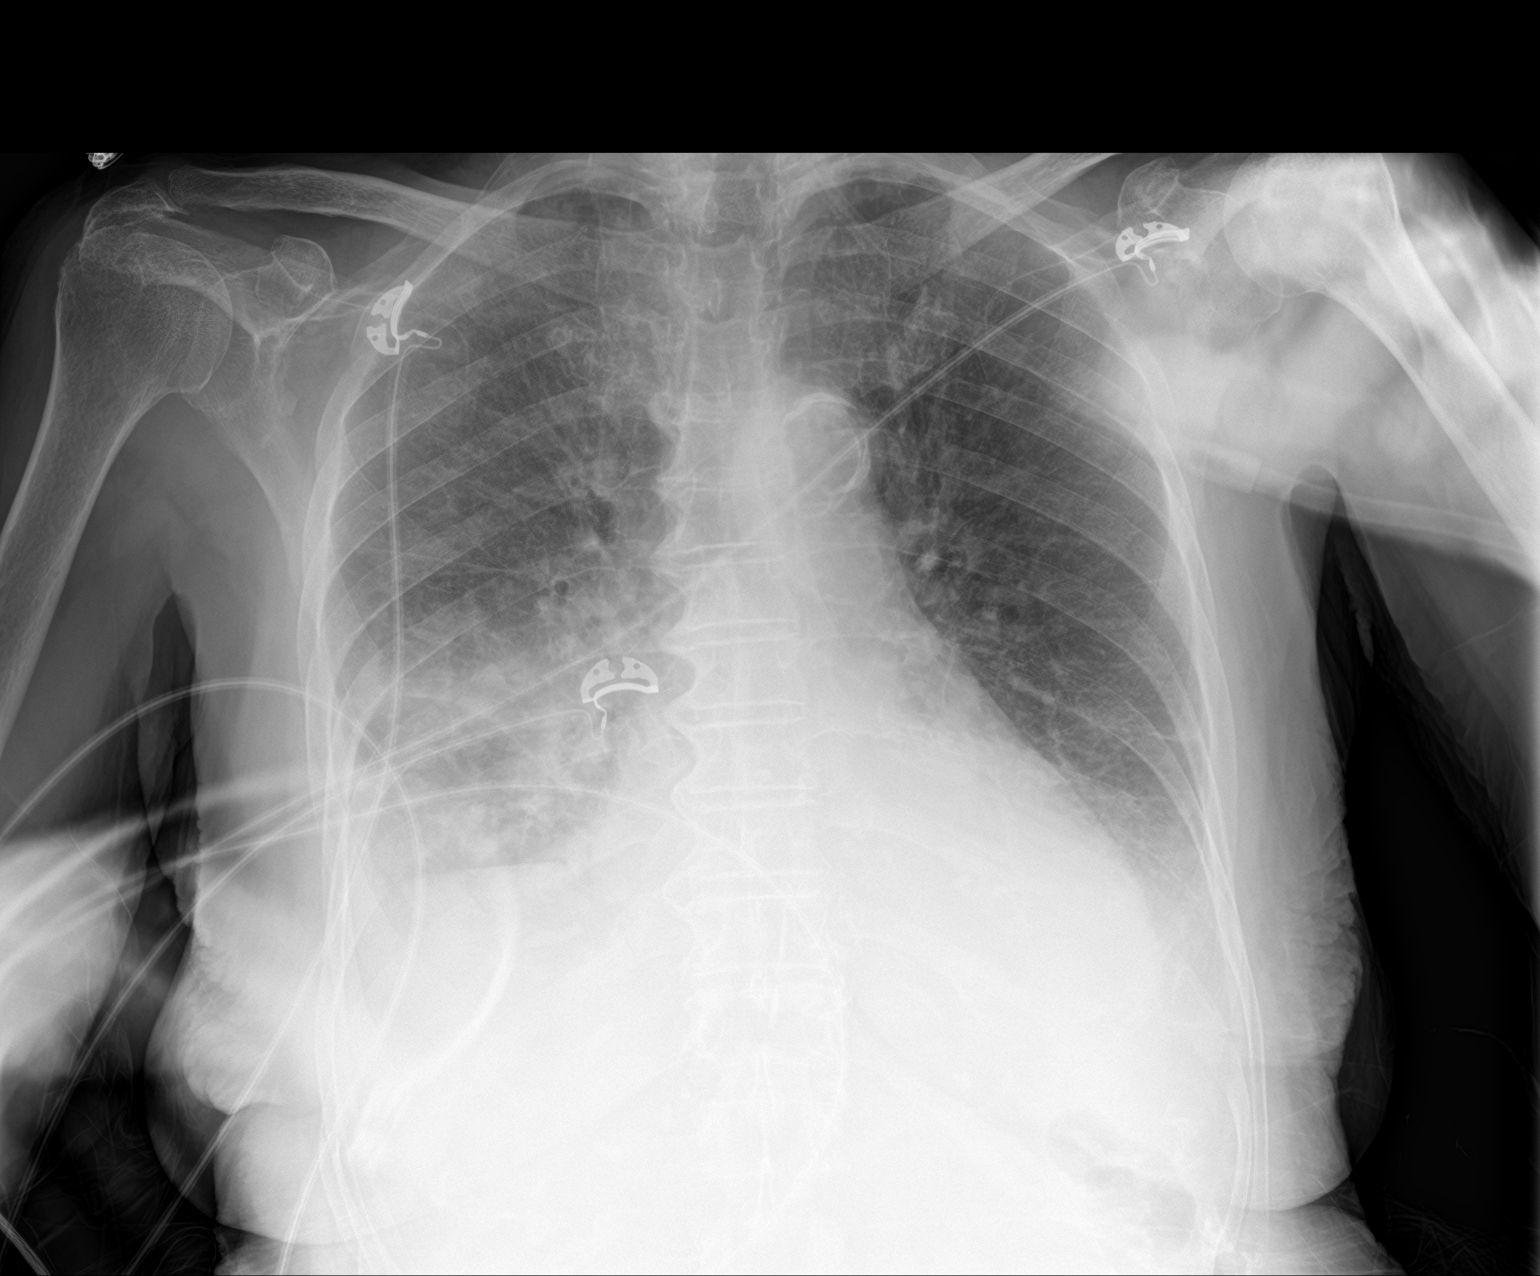

[2 of 2 positions shown; findings below may reference images not displayed]

FINDINGS: Cardiac shadow is enlarged. Aortic calcifications are seen.
Bibasilar infiltrates are noted increased from the prior exam.
Stable vascular congestion and interstitial edema is noted. Small
bilateral pleural effusions are noted as well.
IMPRESSION: Bibasilar infiltrates and effusions with mild vascular congestion.

## 2020-09-02 IMAGING — CR DG CHEST 2V
2 series · 2 of 2 positions shown · non-contrast
Comparison: 09/22/2018 and 09/20/2018.

CLINICAL DATA: Weakness and fatigue for 1 week. Constant mild
shortness of breath on home oxygen. History of diabetes and
hypertension.

EXAM:
CHEST - 2 VIEW

[chest lat]
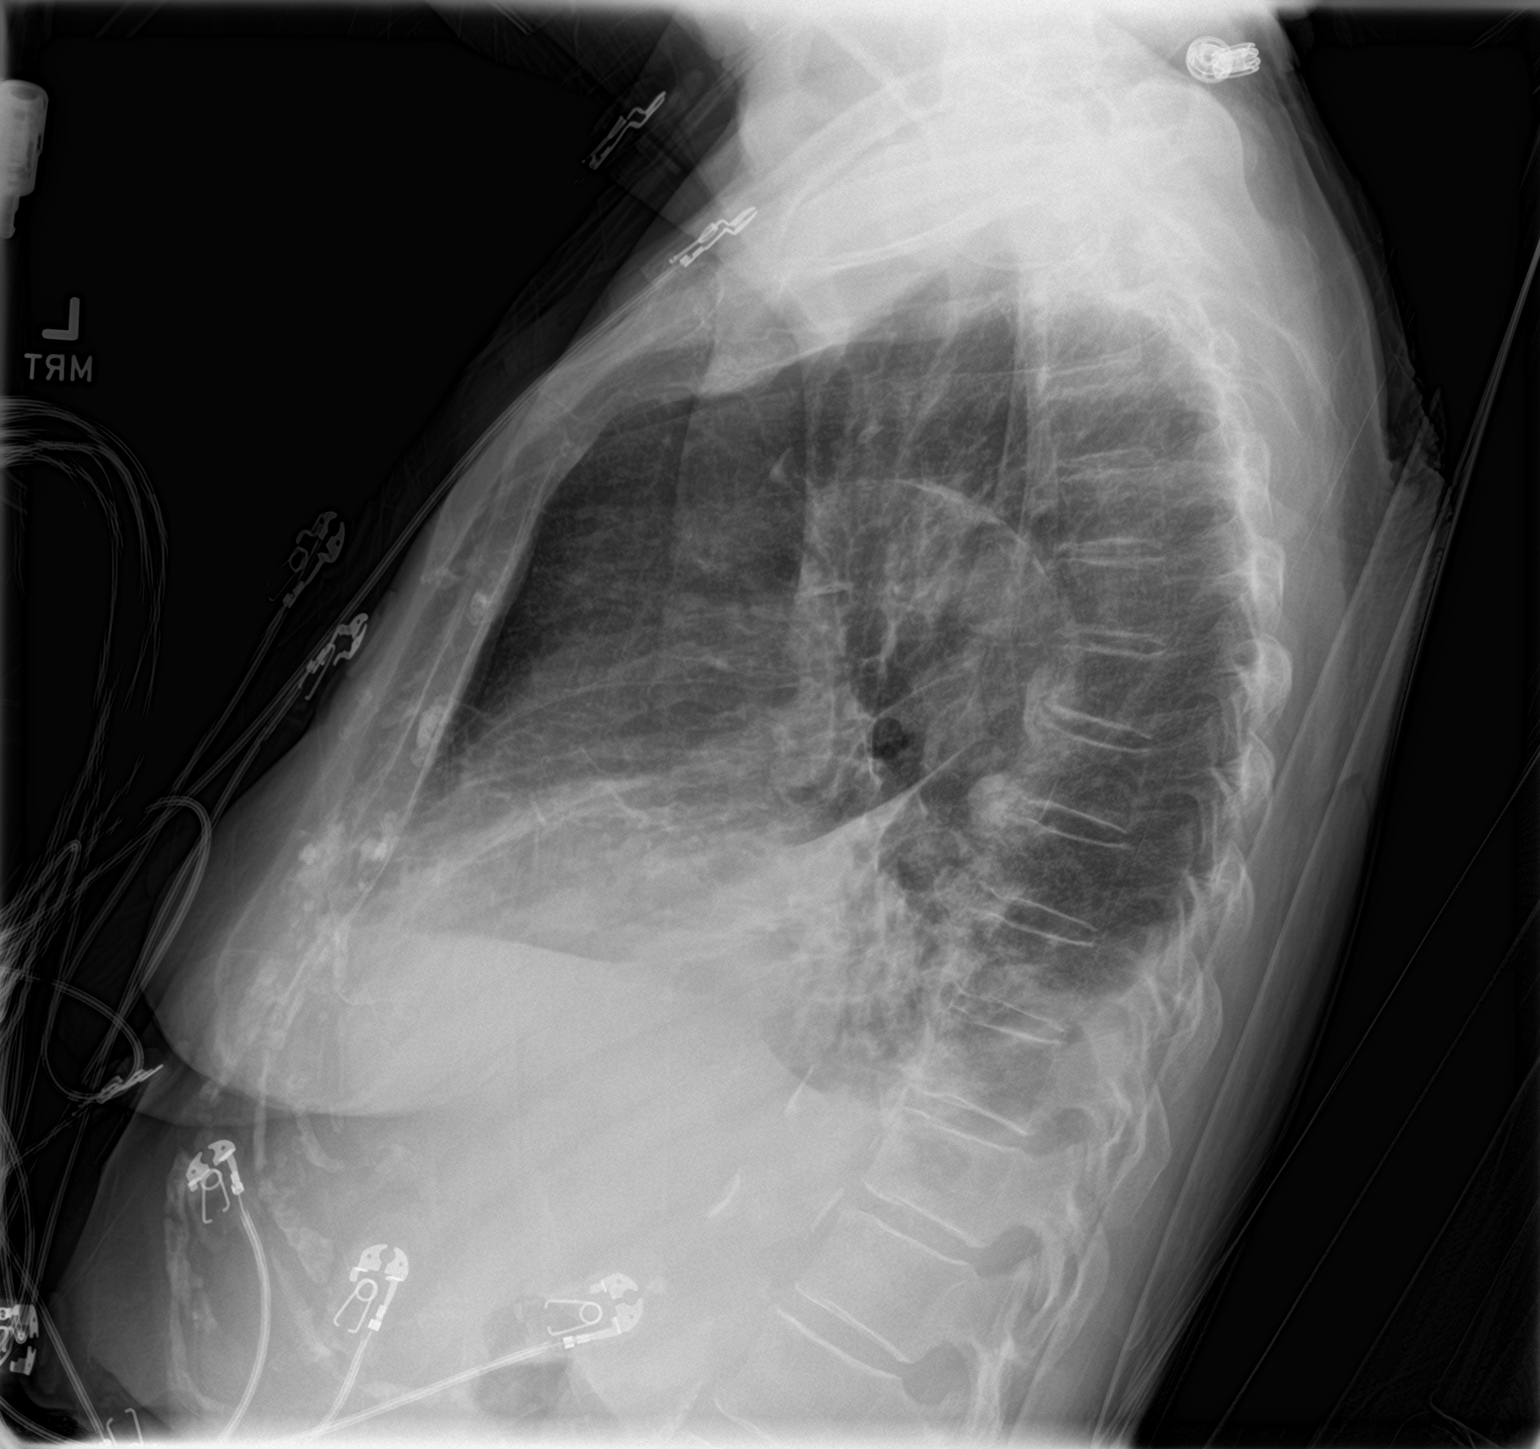

[chest ap]
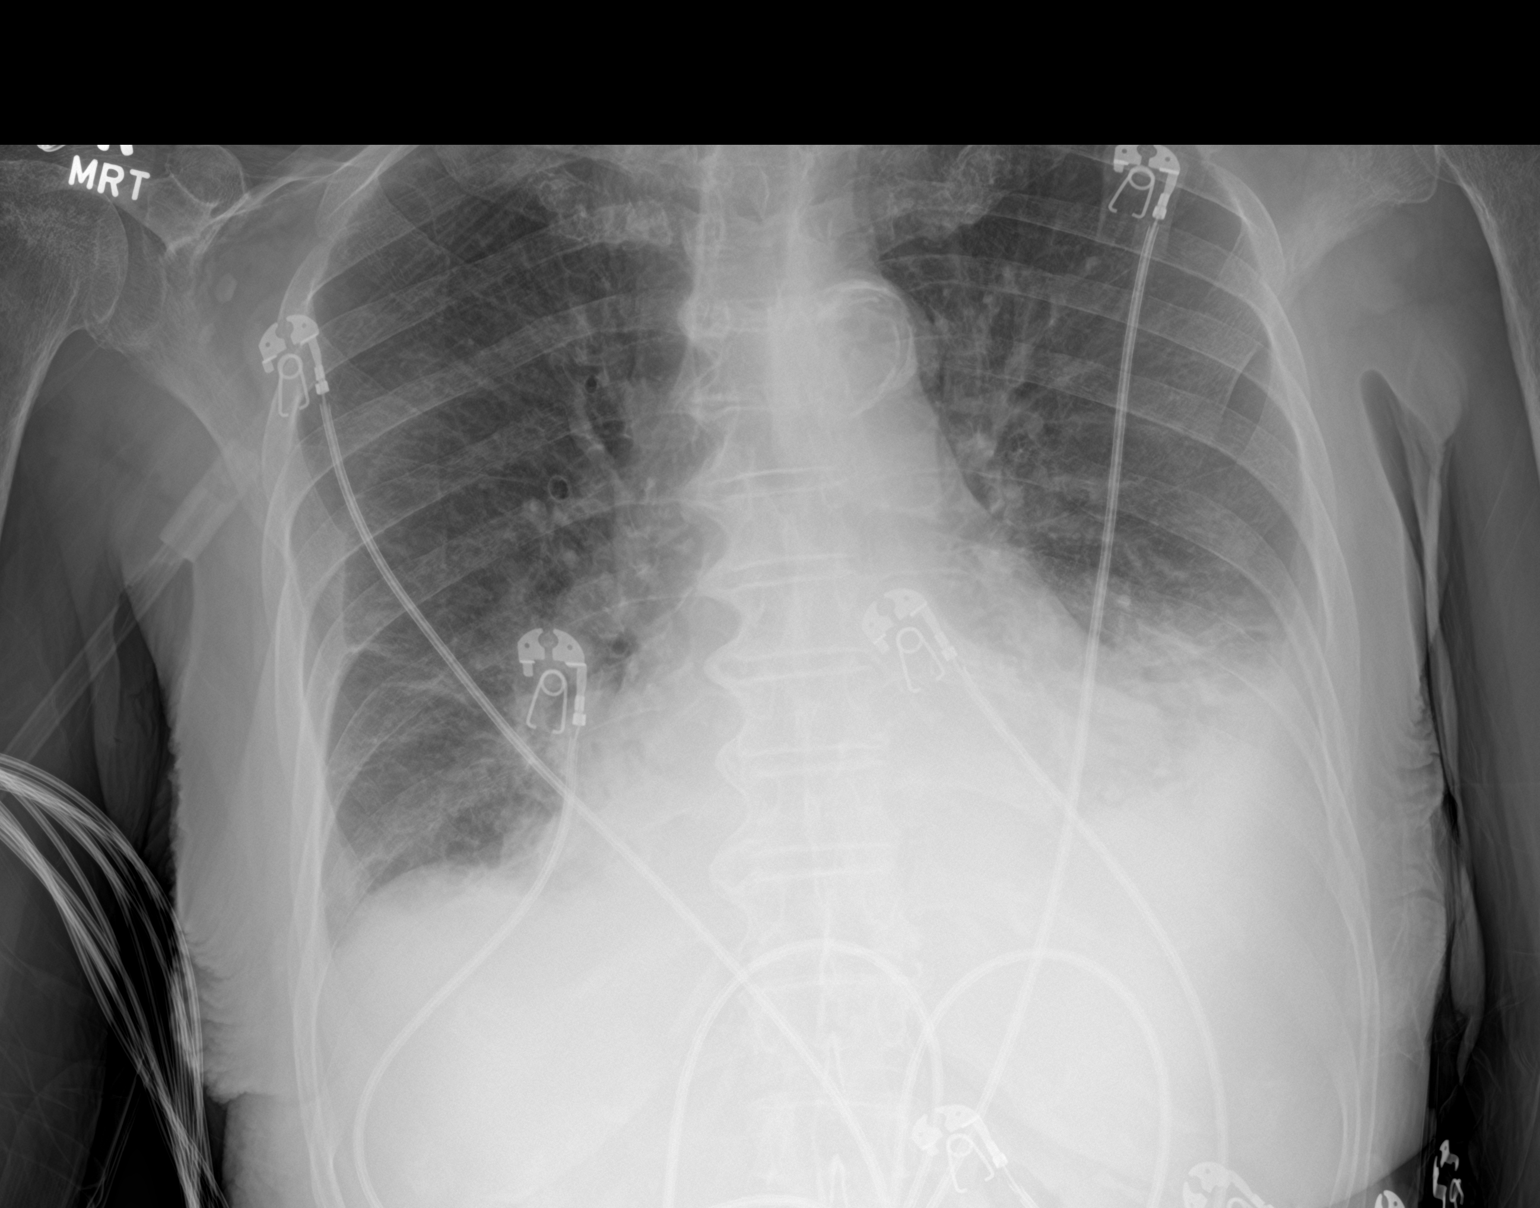

[2 of 2 positions shown; findings below may reference images not displayed]

FINDINGS: Stable mild cardiomegaly and diffuse aortic atherosclerosis. There
are left-greater-than-right pleural effusions with associated
bibasilar pulmonary opacities and mild edema. The left pleural
effusion has enlarged compared with the previous study. The aeration
of the right lung base has improved. There is no pneumothorax or
acute osseous abnormality. Telemetry leads overlie the chest.
IMPRESSION: Fluctuating pulmonary edema, pleural effusions and bibasilar
pulmonary opacities, likely congestive heart failure and associated
basilar atelectasis. The edema has mildly improved from previous
study 3 weeks ago.
